# Patient Record
Sex: Female | Born: 1965
Health system: Southern US, Community
[De-identification: ages and names within clinical notes are randomized; demographics above are authoritative.]

## PROBLEM LIST (undated history)

## (undated) DIAGNOSIS — R112 Nausea with vomiting, unspecified: Secondary | ICD-10-CM

## (undated) DIAGNOSIS — Z9641 Presence of insulin pump (external) (internal): Secondary | ICD-10-CM

## (undated) DIAGNOSIS — M069 Rheumatoid arthritis, unspecified: Secondary | ICD-10-CM

## (undated) DIAGNOSIS — K449 Diaphragmatic hernia without obstruction or gangrene: Secondary | ICD-10-CM

## (undated) DIAGNOSIS — T7840XA Allergy, unspecified, initial encounter: Secondary | ICD-10-CM

## (undated) DIAGNOSIS — K589 Irritable bowel syndrome without diarrhea: Secondary | ICD-10-CM

## (undated) DIAGNOSIS — G43011 Migraine without aura, intractable, with status migrainosus: Secondary | ICD-10-CM

## (undated) DIAGNOSIS — I4891 Unspecified atrial fibrillation: Secondary | ICD-10-CM

## (undated) DIAGNOSIS — D649 Anemia, unspecified: Secondary | ICD-10-CM

## (undated) DIAGNOSIS — I10 Essential (primary) hypertension: Secondary | ICD-10-CM

## (undated) DIAGNOSIS — K219 Gastro-esophageal reflux disease without esophagitis: Secondary | ICD-10-CM

## (undated) DIAGNOSIS — K8020 Calculus of gallbladder without cholecystitis without obstruction: Secondary | ICD-10-CM

## (undated) DIAGNOSIS — D689 Coagulation defect, unspecified: Secondary | ICD-10-CM

## (undated) DIAGNOSIS — Z5189 Encounter for other specified aftercare: Secondary | ICD-10-CM

## (undated) DIAGNOSIS — E0500 Thyrotoxicosis with diffuse goiter without thyrotoxic crisis or storm: Secondary | ICD-10-CM

## (undated) DIAGNOSIS — I471 Supraventricular tachycardia, unspecified: Secondary | ICD-10-CM

## (undated) DIAGNOSIS — I498 Other specified cardiac arrhythmias: Secondary | ICD-10-CM

## (undated) DIAGNOSIS — L409 Psoriasis, unspecified: Secondary | ICD-10-CM

## (undated) DIAGNOSIS — M1990 Unspecified osteoarthritis, unspecified site: Secondary | ICD-10-CM

## (undated) DIAGNOSIS — Z9889 Other specified postprocedural states: Secondary | ICD-10-CM

## (undated) DIAGNOSIS — K259 Gastric ulcer, unspecified as acute or chronic, without hemorrhage or perforation: Secondary | ICD-10-CM

## (undated) DIAGNOSIS — N979 Female infertility, unspecified: Secondary | ICD-10-CM

## (undated) DIAGNOSIS — I219 Acute myocardial infarction, unspecified: Secondary | ICD-10-CM

## (undated) DIAGNOSIS — J069 Acute upper respiratory infection, unspecified: Secondary | ICD-10-CM

## (undated) DIAGNOSIS — F458 Other somatoform disorders: Secondary | ICD-10-CM

## (undated) DIAGNOSIS — H269 Unspecified cataract: Secondary | ICD-10-CM

## (undated) DIAGNOSIS — G5600 Carpal tunnel syndrome, unspecified upper limb: Secondary | ICD-10-CM

## (undated) DIAGNOSIS — E785 Hyperlipidemia, unspecified: Secondary | ICD-10-CM

## (undated) DIAGNOSIS — L259 Unspecified contact dermatitis, unspecified cause: Secondary | ICD-10-CM

## (undated) DIAGNOSIS — R591 Generalized enlarged lymph nodes: Secondary | ICD-10-CM

## (undated) DIAGNOSIS — J0100 Acute maxillary sinusitis, unspecified: Secondary | ICD-10-CM

## (undated) DIAGNOSIS — Z91041 Radiographic dye allergy status: Secondary | ICD-10-CM

## (undated) DIAGNOSIS — J301 Allergic rhinitis due to pollen: Secondary | ICD-10-CM

## (undated) DIAGNOSIS — I2109 ST elevation (STEMI) myocardial infarction involving other coronary artery of anterior wall: Secondary | ICD-10-CM

## (undated) DIAGNOSIS — E109 Type 1 diabetes mellitus without complications: Secondary | ICD-10-CM

## (undated) DIAGNOSIS — I25810 Atherosclerosis of coronary artery bypass graft(s) without angina pectoris: Secondary | ICD-10-CM

## (undated) DIAGNOSIS — E0590 Thyrotoxicosis, unspecified without thyrotoxic crisis or storm: Secondary | ICD-10-CM

## (undated) DIAGNOSIS — M6530 Trigger finger, unspecified finger: Secondary | ICD-10-CM

## (undated) DIAGNOSIS — D509 Iron deficiency anemia, unspecified: Secondary | ICD-10-CM

## (undated) DIAGNOSIS — M779 Enthesopathy, unspecified: Secondary | ICD-10-CM

## (undated) DIAGNOSIS — G4730 Sleep apnea, unspecified: Secondary | ICD-10-CM

## (undated) DIAGNOSIS — M549 Dorsalgia, unspecified: Secondary | ICD-10-CM

## (undated) DIAGNOSIS — E1139 Type 2 diabetes mellitus with other diabetic ophthalmic complication: Secondary | ICD-10-CM

## (undated) HISTORY — DX: Anemia, unspecified: D64.9

## (undated) HISTORY — PX: CATARACT EXTRACTION, BILATERAL: SHX1313

## (undated) HISTORY — DX: Gastro-esophageal reflux disease without esophagitis: K21.9

## (undated) HISTORY — PX: OTHER SURGICAL HISTORY: SHX169

## (undated) HISTORY — DX: Thyrotoxicosis with diffuse goiter without thyrotoxic crisis or storm: E05.00

## (undated) HISTORY — PX: CORONARY ARTERY BYPASS GRAFT: SHX141

## (undated) HISTORY — DX: Atherosclerosis of coronary artery bypass graft(s) without angina pectoris: I25.810

## (undated) HISTORY — DX: Essential (primary) hypertension: I10

## (undated) HISTORY — DX: Dorsalgia, unspecified: M54.9

## (undated) HISTORY — PX: VITRECTOMY: SHX106

## (undated) HISTORY — DX: Supraventricular tachycardia, unspecified: I47.10

## (undated) HISTORY — DX: Female infertility, unspecified: N97.9

## (undated) HISTORY — DX: Generalized enlarged lymph nodes: R59.1

## (undated) HISTORY — DX: Enthesopathy, unspecified: M77.9

## (undated) HISTORY — PX: EYE SURGERY: SHX253

## (undated) HISTORY — DX: Encounter for other specified aftercare: Z51.89

## (undated) HISTORY — DX: Iron deficiency anemia, unspecified: D50.9

## (undated) HISTORY — DX: Trigger finger, unspecified finger: M65.30

## (undated) HISTORY — DX: Unspecified atrial fibrillation: I48.91

## (undated) HISTORY — DX: Thyrotoxicosis, unspecified without thyrotoxic crisis or storm: E05.90

## (undated) HISTORY — DX: Psoriasis, unspecified: L40.9

## (undated) HISTORY — DX: Carpal tunnel syndrome, unspecified upper limb: G56.00

## (undated) HISTORY — DX: Type 2 diabetes mellitus with other diabetic ophthalmic complication: E11.39

## (undated) HISTORY — DX: Acute maxillary sinusitis, unspecified: J01.00

## (undated) HISTORY — DX: Unspecified contact dermatitis, unspecified cause: L25.9

## (undated) HISTORY — DX: Diaphragmatic hernia without obstruction or gangrene: K44.9

## (undated) HISTORY — DX: Type 1 diabetes mellitus without complications: E10.9

## (undated) HISTORY — DX: Other specified cardiac arrhythmias: I49.8

## (undated) HISTORY — DX: Hyperlipidemia, unspecified: E78.5

## (undated) HISTORY — PX: CHOLECYSTECTOMY: SHX55

## (undated) HISTORY — DX: Calculus of gallbladder without cholecystitis without obstruction: K80.20

## (undated) HISTORY — DX: Irritable bowel syndrome, unspecified: K58.9

## (undated) HISTORY — PX: ABDOMINAL HYSTERECTOMY: SHX81

## (undated) HISTORY — DX: Radiographic dye allergy status: Z91.041

## (undated) HISTORY — DX: Migraine without aura, intractable, with status migrainosus: G43.011

## (undated) HISTORY — DX: Coagulation defect, unspecified: D68.9

## (undated) HISTORY — DX: Acute myocardial infarction, unspecified: I21.9

## (undated) HISTORY — DX: Unspecified cataract: H26.9

## (undated) HISTORY — DX: Other somatoform disorders: F45.8

## (undated) HISTORY — DX: ST elevation (STEMI) myocardial infarction involving other coronary artery of anterior wall: I21.09

## (undated) HISTORY — DX: Allergic rhinitis due to pollen: J30.1

## (undated) HISTORY — PX: TRIGGER FINGER RELEASE: SHX641

## (undated) HISTORY — DX: Supraventricular tachycardia: I47.1

## (undated) HISTORY — DX: Acute upper respiratory infection, unspecified: J06.9

## (undated) HISTORY — DX: Unspecified osteoarthritis, unspecified site: M19.90

## (undated) HISTORY — DX: Rheumatoid arthritis, unspecified: M06.9

## (undated) HISTORY — DX: Allergy, unspecified, initial encounter: T78.40XA

## (undated) HISTORY — DX: Gastric ulcer, unspecified as acute or chronic, without hemorrhage or perforation: K25.9

## (undated) MED FILL — Medication: Fill #3 | Status: CN

---

## 1998-10-19 ENCOUNTER — Ambulatory Visit (HOSPITAL_COMMUNITY): Admission: RE | Admit: 1998-10-19 | Discharge: 1998-10-19 | Payer: Self-pay | Admitting: Orthopedic Surgery

## 1999-02-09 ENCOUNTER — Other Ambulatory Visit: Admission: RE | Admit: 1999-02-09 | Discharge: 1999-02-09 | Payer: Self-pay | Admitting: Obstetrics and Gynecology

## 1999-04-17 ENCOUNTER — Ambulatory Visit (HOSPITAL_COMMUNITY): Admission: RE | Admit: 1999-04-17 | Discharge: 1999-04-17 | Payer: Self-pay | Admitting: Ophthalmology

## 1999-04-17 ENCOUNTER — Encounter: Payer: Self-pay | Admitting: Ophthalmology

## 1999-10-22 ENCOUNTER — Inpatient Hospital Stay (HOSPITAL_COMMUNITY): Admission: AD | Admit: 1999-10-22 | Discharge: 1999-10-22 | Payer: Self-pay | Admitting: Obstetrics and Gynecology

## 2000-01-08 ENCOUNTER — Ambulatory Visit (HOSPITAL_COMMUNITY): Admission: RE | Admit: 2000-01-08 | Discharge: 2000-01-08 | Payer: Self-pay | Admitting: Ophthalmology

## 2000-02-26 ENCOUNTER — Other Ambulatory Visit: Admission: RE | Admit: 2000-02-26 | Discharge: 2000-02-26 | Payer: Self-pay | Admitting: Obstetrics and Gynecology

## 2000-07-14 ENCOUNTER — Inpatient Hospital Stay (HOSPITAL_COMMUNITY): Admission: AD | Admit: 2000-07-14 | Discharge: 2000-07-14 | Payer: Self-pay | Admitting: Obstetrics and Gynecology

## 2000-10-15 ENCOUNTER — Ambulatory Visit (HOSPITAL_COMMUNITY): Admission: RE | Admit: 2000-10-15 | Discharge: 2000-10-15 | Payer: Self-pay | Admitting: Obstetrics and Gynecology

## 2000-10-15 ENCOUNTER — Encounter: Payer: Self-pay | Admitting: Obstetrics and Gynecology

## 2001-04-24 ENCOUNTER — Other Ambulatory Visit: Admission: RE | Admit: 2001-04-24 | Discharge: 2001-04-24 | Payer: Self-pay | Admitting: Obstetrics and Gynecology

## 2001-07-17 ENCOUNTER — Encounter: Payer: Self-pay | Admitting: Obstetrics and Gynecology

## 2001-07-17 ENCOUNTER — Ambulatory Visit (HOSPITAL_COMMUNITY): Admission: RE | Admit: 2001-07-17 | Discharge: 2001-07-17 | Payer: Self-pay | Admitting: Obstetrics and Gynecology

## 2001-07-25 ENCOUNTER — Encounter: Payer: Self-pay | Admitting: Obstetrics and Gynecology

## 2001-07-25 ENCOUNTER — Inpatient Hospital Stay (HOSPITAL_COMMUNITY): Admission: AD | Admit: 2001-07-25 | Discharge: 2001-07-25 | Payer: Self-pay | Admitting: Obstetrics and Gynecology

## 2001-08-12 ENCOUNTER — Inpatient Hospital Stay (HOSPITAL_COMMUNITY): Admission: AD | Admit: 2001-08-12 | Discharge: 2001-08-16 | Payer: Self-pay | Admitting: Obstetrics and Gynecology

## 2001-08-13 ENCOUNTER — Encounter: Payer: Self-pay | Admitting: Obstetrics & Gynecology

## 2001-09-02 ENCOUNTER — Encounter (HOSPITAL_COMMUNITY): Admission: RE | Admit: 2001-09-02 | Discharge: 2001-09-05 | Payer: Self-pay | Admitting: Obstetrics and Gynecology

## 2001-09-02 ENCOUNTER — Encounter: Payer: Self-pay | Admitting: Obstetrics and Gynecology

## 2001-09-03 ENCOUNTER — Encounter: Payer: Self-pay | Admitting: Obstetrics and Gynecology

## 2001-09-05 ENCOUNTER — Inpatient Hospital Stay (HOSPITAL_COMMUNITY): Admission: AD | Admit: 2001-09-05 | Discharge: 2001-09-10 | Payer: Self-pay | Admitting: Obstetrics and Gynecology

## 2001-09-05 ENCOUNTER — Encounter (INDEPENDENT_AMBULATORY_CARE_PROVIDER_SITE_OTHER): Payer: Self-pay | Admitting: Specialist

## 2001-09-05 ENCOUNTER — Encounter: Payer: Self-pay | Admitting: Obstetrics and Gynecology

## 2001-09-11 ENCOUNTER — Encounter: Admission: RE | Admit: 2001-09-11 | Discharge: 2001-10-11 | Payer: Self-pay | Admitting: Obstetrics and Gynecology

## 2001-11-11 ENCOUNTER — Encounter: Admission: RE | Admit: 2001-11-11 | Discharge: 2001-12-11 | Payer: Self-pay | Admitting: Obstetrics and Gynecology

## 2002-02-13 ENCOUNTER — Ambulatory Visit (HOSPITAL_BASED_OUTPATIENT_CLINIC_OR_DEPARTMENT_OTHER): Admission: RE | Admit: 2002-02-13 | Discharge: 2002-02-13 | Payer: Self-pay | Admitting: Orthopedic Surgery

## 2002-04-06 ENCOUNTER — Other Ambulatory Visit: Admission: RE | Admit: 2002-04-06 | Discharge: 2002-04-06 | Payer: Self-pay | Admitting: Obstetrics and Gynecology

## 2002-07-28 ENCOUNTER — Ambulatory Visit (HOSPITAL_BASED_OUTPATIENT_CLINIC_OR_DEPARTMENT_OTHER): Admission: RE | Admit: 2002-07-28 | Discharge: 2002-07-28 | Payer: Self-pay | Admitting: Orthopaedic Surgery

## 2003-03-01 ENCOUNTER — Encounter: Payer: Self-pay | Admitting: Ophthalmology

## 2003-03-01 ENCOUNTER — Ambulatory Visit (HOSPITAL_COMMUNITY): Admission: RE | Admit: 2003-03-01 | Discharge: 2003-03-01 | Payer: Self-pay | Admitting: Ophthalmology

## 2003-04-20 ENCOUNTER — Other Ambulatory Visit: Admission: RE | Admit: 2003-04-20 | Discharge: 2003-04-20 | Payer: Self-pay | Admitting: Obstetrics and Gynecology

## 2003-08-23 ENCOUNTER — Ambulatory Visit (HOSPITAL_BASED_OUTPATIENT_CLINIC_OR_DEPARTMENT_OTHER): Admission: RE | Admit: 2003-08-23 | Discharge: 2003-08-23 | Payer: Self-pay | Admitting: Orthopedic Surgery

## 2004-05-05 ENCOUNTER — Other Ambulatory Visit: Admission: RE | Admit: 2004-05-05 | Discharge: 2004-05-05 | Payer: Self-pay | Admitting: Obstetrics and Gynecology

## 2004-08-31 ENCOUNTER — Encounter: Admission: RE | Admit: 2004-08-31 | Discharge: 2004-08-31 | Payer: Self-pay | Admitting: Rheumatology

## 2004-12-29 ENCOUNTER — Encounter: Admission: RE | Admit: 2004-12-29 | Discharge: 2004-12-29 | Payer: Self-pay | Admitting: Surgery

## 2005-01-05 ENCOUNTER — Ambulatory Visit (HOSPITAL_COMMUNITY): Admission: RE | Admit: 2005-01-05 | Discharge: 2005-01-05 | Payer: Self-pay | Admitting: Surgery

## 2005-01-14 ENCOUNTER — Emergency Department (HOSPITAL_COMMUNITY): Admission: EM | Admit: 2005-01-14 | Discharge: 2005-01-15 | Payer: Self-pay | Admitting: Emergency Medicine

## 2005-01-22 ENCOUNTER — Encounter (INDEPENDENT_AMBULATORY_CARE_PROVIDER_SITE_OTHER): Payer: Self-pay | Admitting: Specialist

## 2005-01-22 ENCOUNTER — Observation Stay (HOSPITAL_COMMUNITY): Admission: RE | Admit: 2005-01-22 | Discharge: 2005-01-22 | Payer: Self-pay | Admitting: Surgery

## 2005-05-31 ENCOUNTER — Encounter: Payer: Self-pay | Admitting: Family Medicine

## 2005-05-31 LAB — CONVERTED CEMR LAB

## 2005-06-11 ENCOUNTER — Ambulatory Visit: Payer: Self-pay | Admitting: Internal Medicine

## 2005-06-18 ENCOUNTER — Ambulatory Visit: Payer: Self-pay | Admitting: Cardiology

## 2005-06-21 ENCOUNTER — Ambulatory Visit: Payer: Self-pay | Admitting: Internal Medicine

## 2005-07-24 ENCOUNTER — Ambulatory Visit: Payer: Self-pay | Admitting: Internal Medicine

## 2005-09-17 ENCOUNTER — Other Ambulatory Visit: Admission: RE | Admit: 2005-09-17 | Discharge: 2005-09-17 | Payer: Self-pay | Admitting: Obstetrics and Gynecology

## 2005-12-02 ENCOUNTER — Encounter: Admission: RE | Admit: 2005-12-02 | Discharge: 2005-12-02 | Payer: Self-pay | Admitting: Rheumatology

## 2006-02-27 ENCOUNTER — Ambulatory Visit (HOSPITAL_COMMUNITY): Admission: RE | Admit: 2006-02-27 | Discharge: 2006-02-27 | Payer: Self-pay | Admitting: Orthopedic Surgery

## 2006-09-10 ENCOUNTER — Encounter: Admission: RE | Admit: 2006-09-10 | Discharge: 2006-09-10 | Payer: Self-pay | Admitting: Rheumatology

## 2006-10-15 ENCOUNTER — Ambulatory Visit: Payer: Self-pay | Admitting: Family Medicine

## 2006-10-16 ENCOUNTER — Encounter: Payer: Self-pay | Admitting: Family Medicine

## 2006-10-16 DIAGNOSIS — M069 Rheumatoid arthritis, unspecified: Secondary | ICD-10-CM | POA: Insufficient documentation

## 2006-10-16 DIAGNOSIS — E108 Type 1 diabetes mellitus with unspecified complications: Secondary | ICD-10-CM | POA: Insufficient documentation

## 2006-10-16 DIAGNOSIS — E109 Type 1 diabetes mellitus without complications: Secondary | ICD-10-CM | POA: Insufficient documentation

## 2006-10-16 DIAGNOSIS — K8020 Calculus of gallbladder without cholecystitis without obstruction: Secondary | ICD-10-CM | POA: Insufficient documentation

## 2006-10-16 DIAGNOSIS — I10 Essential (primary) hypertension: Secondary | ICD-10-CM | POA: Insufficient documentation

## 2006-10-16 DIAGNOSIS — J301 Allergic rhinitis due to pollen: Secondary | ICD-10-CM | POA: Insufficient documentation

## 2007-01-24 ENCOUNTER — Ambulatory Visit: Payer: Self-pay | Admitting: Family Medicine

## 2007-01-24 DIAGNOSIS — J069 Acute upper respiratory infection, unspecified: Secondary | ICD-10-CM | POA: Insufficient documentation

## 2007-01-24 DIAGNOSIS — G43009 Migraine without aura, not intractable, without status migrainosus: Secondary | ICD-10-CM | POA: Insufficient documentation

## 2007-02-24 ENCOUNTER — Ambulatory Visit: Payer: Self-pay | Admitting: Family Medicine

## 2007-04-03 ENCOUNTER — Ambulatory Visit: Payer: Self-pay | Admitting: Family Medicine

## 2007-04-03 LAB — CONVERTED CEMR LAB
Blood in Urine, dipstick: NEGATIVE
Glucose, Urine, Semiquant: NEGATIVE
Ketones, urine, test strip: NEGATIVE
Protein, U semiquant: NEGATIVE
Specific Gravity, Urine: 1.025
pH: 5.5

## 2007-05-13 ENCOUNTER — Telehealth: Payer: Self-pay | Admitting: Family Medicine

## 2007-06-26 ENCOUNTER — Ambulatory Visit: Payer: Self-pay | Admitting: Family Medicine

## 2007-06-26 LAB — CONVERTED CEMR LAB: Hgb A1c MFr Bld: 6.8 %

## 2008-01-15 LAB — CONVERTED CEMR LAB: Hgb A1c MFr Bld: 7.1 %

## 2008-01-22 ENCOUNTER — Ambulatory Visit: Payer: Self-pay | Admitting: Family Medicine

## 2008-01-28 ENCOUNTER — Encounter: Payer: Self-pay | Admitting: Family Medicine

## 2008-02-19 ENCOUNTER — Ambulatory Visit: Payer: Self-pay | Admitting: Family Medicine

## 2008-02-19 DIAGNOSIS — G43011 Migraine without aura, intractable, with status migrainosus: Secondary | ICD-10-CM

## 2008-02-19 HISTORY — DX: Migraine without aura, intractable, with status migrainosus: G43.011

## 2008-04-29 ENCOUNTER — Encounter: Payer: Self-pay | Admitting: Family Medicine

## 2008-05-28 ENCOUNTER — Encounter: Payer: Self-pay | Admitting: Family Medicine

## 2008-08-27 ENCOUNTER — Encounter: Payer: Self-pay | Admitting: Family Medicine

## 2008-11-03 ENCOUNTER — Encounter: Payer: Self-pay | Admitting: Family Medicine

## 2008-12-08 ENCOUNTER — Ambulatory Visit: Payer: Self-pay | Admitting: Family Medicine

## 2009-01-27 ENCOUNTER — Encounter: Admission: RE | Admit: 2009-01-27 | Discharge: 2009-01-27 | Payer: Self-pay | Admitting: Obstetrics and Gynecology

## 2009-02-03 ENCOUNTER — Encounter: Payer: Self-pay | Admitting: Family Medicine

## 2009-03-31 ENCOUNTER — Ambulatory Visit: Payer: Self-pay | Admitting: Cardiothoracic Surgery

## 2009-04-04 ENCOUNTER — Other Ambulatory Visit: Payer: Self-pay | Admitting: Cardiothoracic Surgery

## 2009-04-20 ENCOUNTER — Ambulatory Visit: Payer: Self-pay | Admitting: Cardiothoracic Surgery

## 2009-04-25 DIAGNOSIS — E1139 Type 2 diabetes mellitus with other diabetic ophthalmic complication: Secondary | ICD-10-CM | POA: Insufficient documentation

## 2009-04-25 DIAGNOSIS — M6530 Trigger finger, unspecified finger: Secondary | ICD-10-CM | POA: Insufficient documentation

## 2009-04-25 DIAGNOSIS — G5600 Carpal tunnel syndrome, unspecified upper limb: Secondary | ICD-10-CM | POA: Insufficient documentation

## 2009-04-26 ENCOUNTER — Encounter (INDEPENDENT_AMBULATORY_CARE_PROVIDER_SITE_OTHER): Payer: Self-pay

## 2009-04-26 ENCOUNTER — Ambulatory Visit: Payer: Self-pay | Admitting: Cardiovascular Disease

## 2009-04-26 DIAGNOSIS — R079 Chest pain, unspecified: Secondary | ICD-10-CM | POA: Insufficient documentation

## 2009-04-26 DIAGNOSIS — E785 Hyperlipidemia, unspecified: Secondary | ICD-10-CM | POA: Insufficient documentation

## 2009-04-26 DIAGNOSIS — I2109 ST elevation (STEMI) myocardial infarction involving other coronary artery of anterior wall: Secondary | ICD-10-CM | POA: Insufficient documentation

## 2009-04-28 ENCOUNTER — Ambulatory Visit: Payer: Self-pay | Admitting: Cardiovascular Disease

## 2009-04-28 LAB — CONVERTED CEMR LAB
Basophils Absolute: 0 10*3/uL (ref 0.0–0.1)
Calcium: 9 mg/dL (ref 8.4–10.5)
Eosinophils Relative: 19.6 % — ABNORMAL HIGH (ref 0.0–5.0)
GFR calc non Af Amer: 97.77 mL/min (ref >60)
Glucose, Bld: 172 mg/dL — ABNORMAL HIGH (ref 70–99)
HCT: 35.2 % — ABNORMAL LOW (ref 36.0–46.0)
Hemoglobin: 12.2 g/dL (ref 12.0–15.0)
Lymphocytes Relative: 26.2 % (ref 12.0–46.0)
Lymphs Abs: 1.4 10*3/uL (ref 0.7–4.0)
Monocytes Relative: 5.2 % (ref 3.0–12.0)
Neutro Abs: 2.6 10*3/uL (ref 1.4–7.7)
Potassium: 3.7 meq/L (ref 3.5–5.1)
RBC: 4 M/uL (ref 3.87–5.11)
RDW: 12.5 % (ref 11.5–14.6)
Sodium: 145 meq/L (ref 135–145)
WBC: 5.4 10*3/uL (ref 4.5–10.5)

## 2009-04-29 ENCOUNTER — Encounter (INDEPENDENT_AMBULATORY_CARE_PROVIDER_SITE_OTHER): Payer: Self-pay

## 2009-04-29 ENCOUNTER — Encounter: Payer: Self-pay | Admitting: Cardiovascular Disease

## 2009-05-02 ENCOUNTER — Inpatient Hospital Stay (HOSPITAL_BASED_OUTPATIENT_CLINIC_OR_DEPARTMENT_OTHER): Admission: RE | Admit: 2009-05-02 | Discharge: 2009-05-02 | Payer: Self-pay | Admitting: Cardiovascular Disease

## 2009-05-02 ENCOUNTER — Ambulatory Visit: Payer: Self-pay | Admitting: Cardiovascular Disease

## 2009-05-05 ENCOUNTER — Telehealth: Payer: Self-pay | Admitting: Cardiovascular Disease

## 2009-05-10 ENCOUNTER — Telehealth (INDEPENDENT_AMBULATORY_CARE_PROVIDER_SITE_OTHER): Payer: Self-pay | Admitting: *Deleted

## 2009-05-11 ENCOUNTER — Encounter: Payer: Self-pay | Admitting: Family Medicine

## 2009-05-11 ENCOUNTER — Telehealth: Payer: Self-pay | Admitting: Cardiovascular Disease

## 2009-05-11 ENCOUNTER — Encounter (INDEPENDENT_AMBULATORY_CARE_PROVIDER_SITE_OTHER): Payer: Self-pay | Admitting: Radiology

## 2009-05-18 ENCOUNTER — Telehealth (INDEPENDENT_AMBULATORY_CARE_PROVIDER_SITE_OTHER): Payer: Self-pay | Admitting: *Deleted

## 2009-05-19 ENCOUNTER — Ambulatory Visit: Payer: Self-pay

## 2009-05-24 ENCOUNTER — Ambulatory Visit: Payer: Self-pay | Admitting: Cardiovascular Disease

## 2009-05-31 ENCOUNTER — Ambulatory Visit: Payer: Self-pay | Admitting: Cardiovascular Disease

## 2009-06-02 DIAGNOSIS — I209 Angina pectoris, unspecified: Secondary | ICD-10-CM | POA: Insufficient documentation

## 2009-06-02 LAB — CONVERTED CEMR LAB
AST: 17 units/L (ref 0–37)
Albumin: 3.7 g/dL (ref 3.5–5.2)
Alkaline Phosphatase: 64 units/L (ref 39–117)
Cholesterol: 155 mg/dL (ref 0–200)
HDL: 69.4 mg/dL (ref >39.00)
LDL Cholesterol: 74 mg/dL (ref 0–99)
Total Protein: 7 g/dL (ref 6.0–8.3)
Triglycerides: 60 mg/dL (ref 0.0–149.0)

## 2009-06-03 ENCOUNTER — Ambulatory Visit: Payer: Self-pay | Admitting: Cardiovascular Disease

## 2009-06-03 ENCOUNTER — Encounter: Payer: Self-pay | Admitting: Cardiovascular Disease

## 2009-06-03 ENCOUNTER — Ambulatory Visit: Payer: Self-pay | Admitting: Cardiothoracic Surgery

## 2009-06-03 ENCOUNTER — Inpatient Hospital Stay (HOSPITAL_COMMUNITY): Admission: AD | Admit: 2009-06-03 | Discharge: 2009-06-12 | Payer: Self-pay | Admitting: Cardiovascular Disease

## 2009-06-06 ENCOUNTER — Encounter: Payer: Self-pay | Admitting: Cardiothoracic Surgery

## 2009-06-07 ENCOUNTER — Encounter: Payer: Self-pay | Admitting: Cardiovascular Disease

## 2009-06-07 DIAGNOSIS — I25810 Atherosclerosis of coronary artery bypass graft(s) without angina pectoris: Secondary | ICD-10-CM

## 2009-06-07 DIAGNOSIS — Z951 Presence of aortocoronary bypass graft: Secondary | ICD-10-CM | POA: Insufficient documentation

## 2009-06-17 ENCOUNTER — Encounter: Payer: Self-pay | Admitting: Cardiovascular Disease

## 2009-06-21 ENCOUNTER — Ambulatory Visit: Payer: Self-pay | Admitting: Cardiovascular Disease

## 2009-06-21 LAB — CONVERTED CEMR LAB
Calcium: 8.4 mg/dL (ref 8.4–10.5)
Chloride: 101 meq/L (ref 96–112)
Creatinine, Ser: 0.9 mg/dL (ref 0.4–1.2)
Sodium: 139 meq/L (ref 135–145)

## 2009-06-29 ENCOUNTER — Telehealth: Payer: Self-pay | Admitting: Family Medicine

## 2009-07-04 ENCOUNTER — Inpatient Hospital Stay (HOSPITAL_COMMUNITY): Admission: EM | Admit: 2009-07-04 | Discharge: 2009-07-07 | Payer: Self-pay | Admitting: Emergency Medicine

## 2009-07-04 ENCOUNTER — Ambulatory Visit: Payer: Self-pay | Admitting: Cardiology

## 2009-07-04 ENCOUNTER — Telehealth (INDEPENDENT_AMBULATORY_CARE_PROVIDER_SITE_OTHER): Payer: Self-pay | Admitting: Physician Assistant

## 2009-07-05 ENCOUNTER — Ambulatory Visit: Payer: Self-pay | Admitting: Gastroenterology

## 2009-07-05 ENCOUNTER — Encounter (INDEPENDENT_AMBULATORY_CARE_PROVIDER_SITE_OTHER): Payer: Self-pay | Admitting: Internal Medicine

## 2009-07-06 ENCOUNTER — Encounter: Payer: Self-pay | Admitting: Gastroenterology

## 2009-07-14 ENCOUNTER — Ambulatory Visit: Payer: Self-pay | Admitting: Cardiothoracic Surgery

## 2009-07-14 ENCOUNTER — Encounter (HOSPITAL_COMMUNITY): Admission: RE | Admit: 2009-07-14 | Discharge: 2009-09-16 | Payer: Self-pay | Admitting: Cardiovascular Disease

## 2009-07-19 ENCOUNTER — Ambulatory Visit: Payer: Self-pay | Admitting: Cardiovascular Disease

## 2009-07-20 ENCOUNTER — Encounter: Payer: Self-pay | Admitting: Cardiovascular Disease

## 2009-07-20 DIAGNOSIS — I4891 Unspecified atrial fibrillation: Secondary | ICD-10-CM | POA: Insufficient documentation

## 2009-08-01 ENCOUNTER — Encounter: Admission: RE | Admit: 2009-08-01 | Discharge: 2009-08-01 | Payer: Self-pay | Admitting: Cardiothoracic Surgery

## 2009-08-01 ENCOUNTER — Ambulatory Visit: Payer: Self-pay | Admitting: Cardiothoracic Surgery

## 2009-08-12 ENCOUNTER — Telehealth (INDEPENDENT_AMBULATORY_CARE_PROVIDER_SITE_OTHER): Payer: Self-pay | Admitting: *Deleted

## 2009-08-17 ENCOUNTER — Telehealth: Payer: Self-pay | Admitting: Cardiovascular Disease

## 2009-08-17 ENCOUNTER — Encounter: Payer: Self-pay | Admitting: Cardiovascular Disease

## 2009-08-18 ENCOUNTER — Encounter: Payer: Self-pay | Admitting: Cardiovascular Disease

## 2009-08-29 ENCOUNTER — Encounter: Payer: Self-pay | Admitting: Cardiovascular Disease

## 2009-08-31 ENCOUNTER — Telehealth: Payer: Self-pay | Admitting: Cardiovascular Disease

## 2009-09-10 ENCOUNTER — Encounter: Payer: Self-pay | Admitting: Family Medicine

## 2009-10-03 ENCOUNTER — Encounter: Payer: Self-pay | Admitting: Cardiovascular Disease

## 2009-10-12 ENCOUNTER — Encounter (INDEPENDENT_AMBULATORY_CARE_PROVIDER_SITE_OTHER): Payer: Self-pay | Admitting: *Deleted

## 2009-10-13 ENCOUNTER — Encounter: Payer: Self-pay | Admitting: Family Medicine

## 2009-10-13 ENCOUNTER — Encounter: Payer: Self-pay | Admitting: Cardiovascular Disease

## 2009-10-17 ENCOUNTER — Ambulatory Visit: Payer: Self-pay | Admitting: Cardiovascular Disease

## 2009-10-24 LAB — CONVERTED CEMR LAB
Albumin: 3.8 g/dL (ref 3.5–5.2)
Alkaline Phosphatase: 49 units/L (ref 39–117)
Cholesterol: 133 mg/dL (ref 0–200)
HDL: 52.7 mg/dL (ref >39.00)
LDL Cholesterol: 68 mg/dL (ref 0–99)
Total CHOL/HDL Ratio: 3
Total Protein: 6.9 g/dL (ref 6.0–8.3)
Triglycerides: 61 mg/dL (ref 0.0–149.0)

## 2009-11-14 ENCOUNTER — Telehealth: Payer: Self-pay | Admitting: Cardiovascular Disease

## 2009-11-14 ENCOUNTER — Ambulatory Visit: Payer: Self-pay | Admitting: Cardiovascular Disease

## 2009-11-14 DIAGNOSIS — R42 Dizziness and giddiness: Secondary | ICD-10-CM | POA: Insufficient documentation

## 2009-11-17 ENCOUNTER — Encounter: Payer: Self-pay | Admitting: Cardiology

## 2009-11-17 ENCOUNTER — Ambulatory Visit: Payer: Self-pay | Admitting: Cardiovascular Disease

## 2009-11-28 ENCOUNTER — Telehealth: Payer: Self-pay | Admitting: Cardiovascular Disease

## 2009-12-07 ENCOUNTER — Encounter: Payer: Self-pay | Admitting: Family Medicine

## 2009-12-29 ENCOUNTER — Ambulatory Visit (HOSPITAL_COMMUNITY): Admission: RE | Admit: 2009-12-29 | Discharge: 2009-12-29 | Payer: Self-pay | Admitting: Orthopedic Surgery

## 2010-01-09 ENCOUNTER — Encounter: Payer: Self-pay | Admitting: Family Medicine

## 2010-01-09 ENCOUNTER — Encounter: Payer: Self-pay | Admitting: Cardiovascular Disease

## 2010-02-01 ENCOUNTER — Encounter: Payer: Self-pay | Admitting: Family Medicine

## 2010-02-21 ENCOUNTER — Ambulatory Visit: Payer: Self-pay | Admitting: Cardiovascular Disease

## 2010-02-28 LAB — CONVERTED CEMR LAB
ALT: 13 units/L (ref 0–35)
AST: 18 units/L (ref 0–37)
Bilirubin, Direct: 0 mg/dL (ref 0.0–0.3)
Cholesterol: 117 mg/dL (ref 0–200)
HDL: 59.8 mg/dL (ref >39.00)
Total Bilirubin: 0.2 mg/dL — ABNORMAL LOW (ref 0.3–1.2)
Total Protein: 6.5 g/dL (ref 6.0–8.3)
VLDL: 8.4 mg/dL (ref 0.0–40.0)

## 2010-03-21 ENCOUNTER — Encounter: Admission: RE | Admit: 2010-03-21 | Discharge: 2010-03-21 | Payer: Self-pay | Admitting: Orthopedic Surgery

## 2010-04-10 IMAGING — CR DG CHEST 1V PORT
1 series · 1 of 1 positions shown · non-contrast
Comparison: Chest radiograph 06/08/2009

CLINICAL DATA: CABG

PORTABLE CHEST - 1 VIEW

[AP]
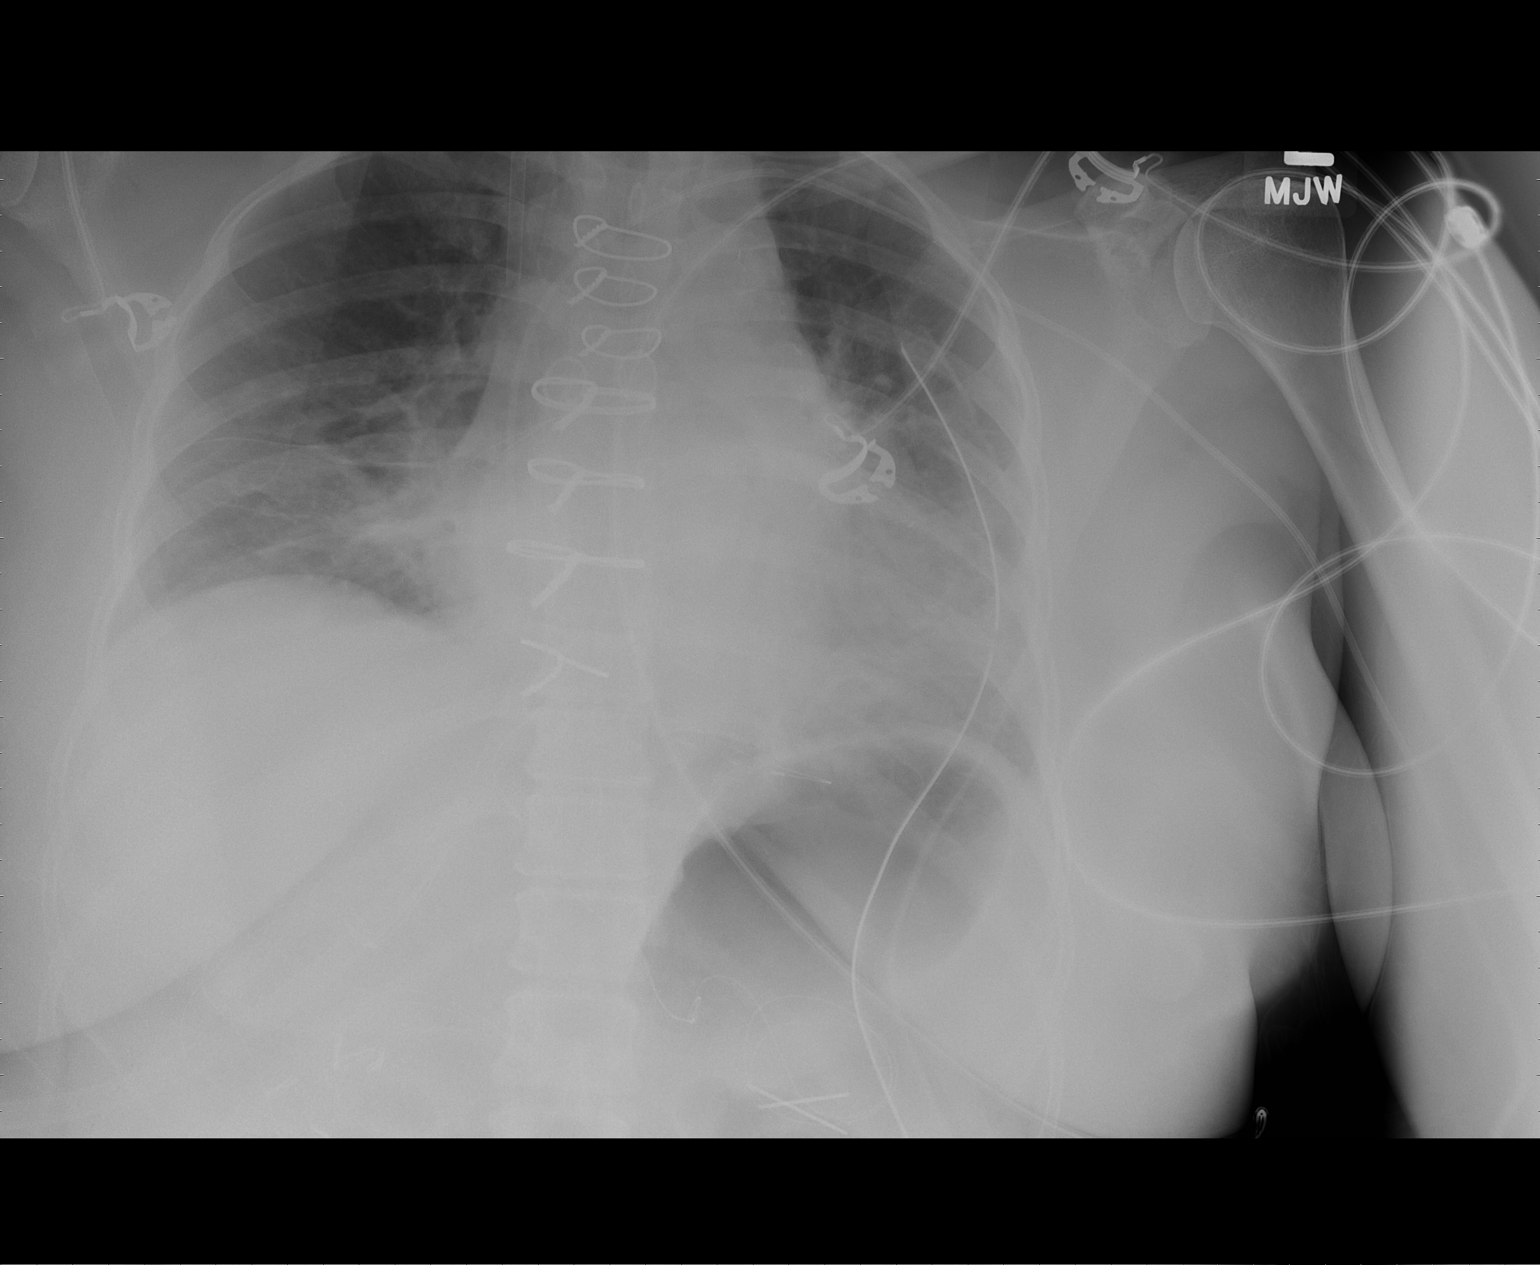

[1 of 1 positions shown; findings below may reference images not displayed]

FINDINGS: Interval retraction of Swan-Ganz catheter.  Right IJ
sheath remains.  Left chest tube remains.  There is a small left
apical pneumothorax measuring  3 mm from the chest wall.  There is
bibasilar atelectasis which is slightly increased from prior.
IMPRESSION: 1.  Small left apical pneumothorax.

2.  Left chest tube in place.
3.  Slight increase in bibasilar atelectasis.

This was made a call report.

## 2010-04-11 IMAGING — CR DG CHEST 2V
2 series · 2 of 2 positions shown · non-contrast
Comparison: 06/09/2009

CLINICAL DATA: Coronary artery disease status post CABG, shortness
of breath

CHEST - 2 VIEW

[w chest pa]
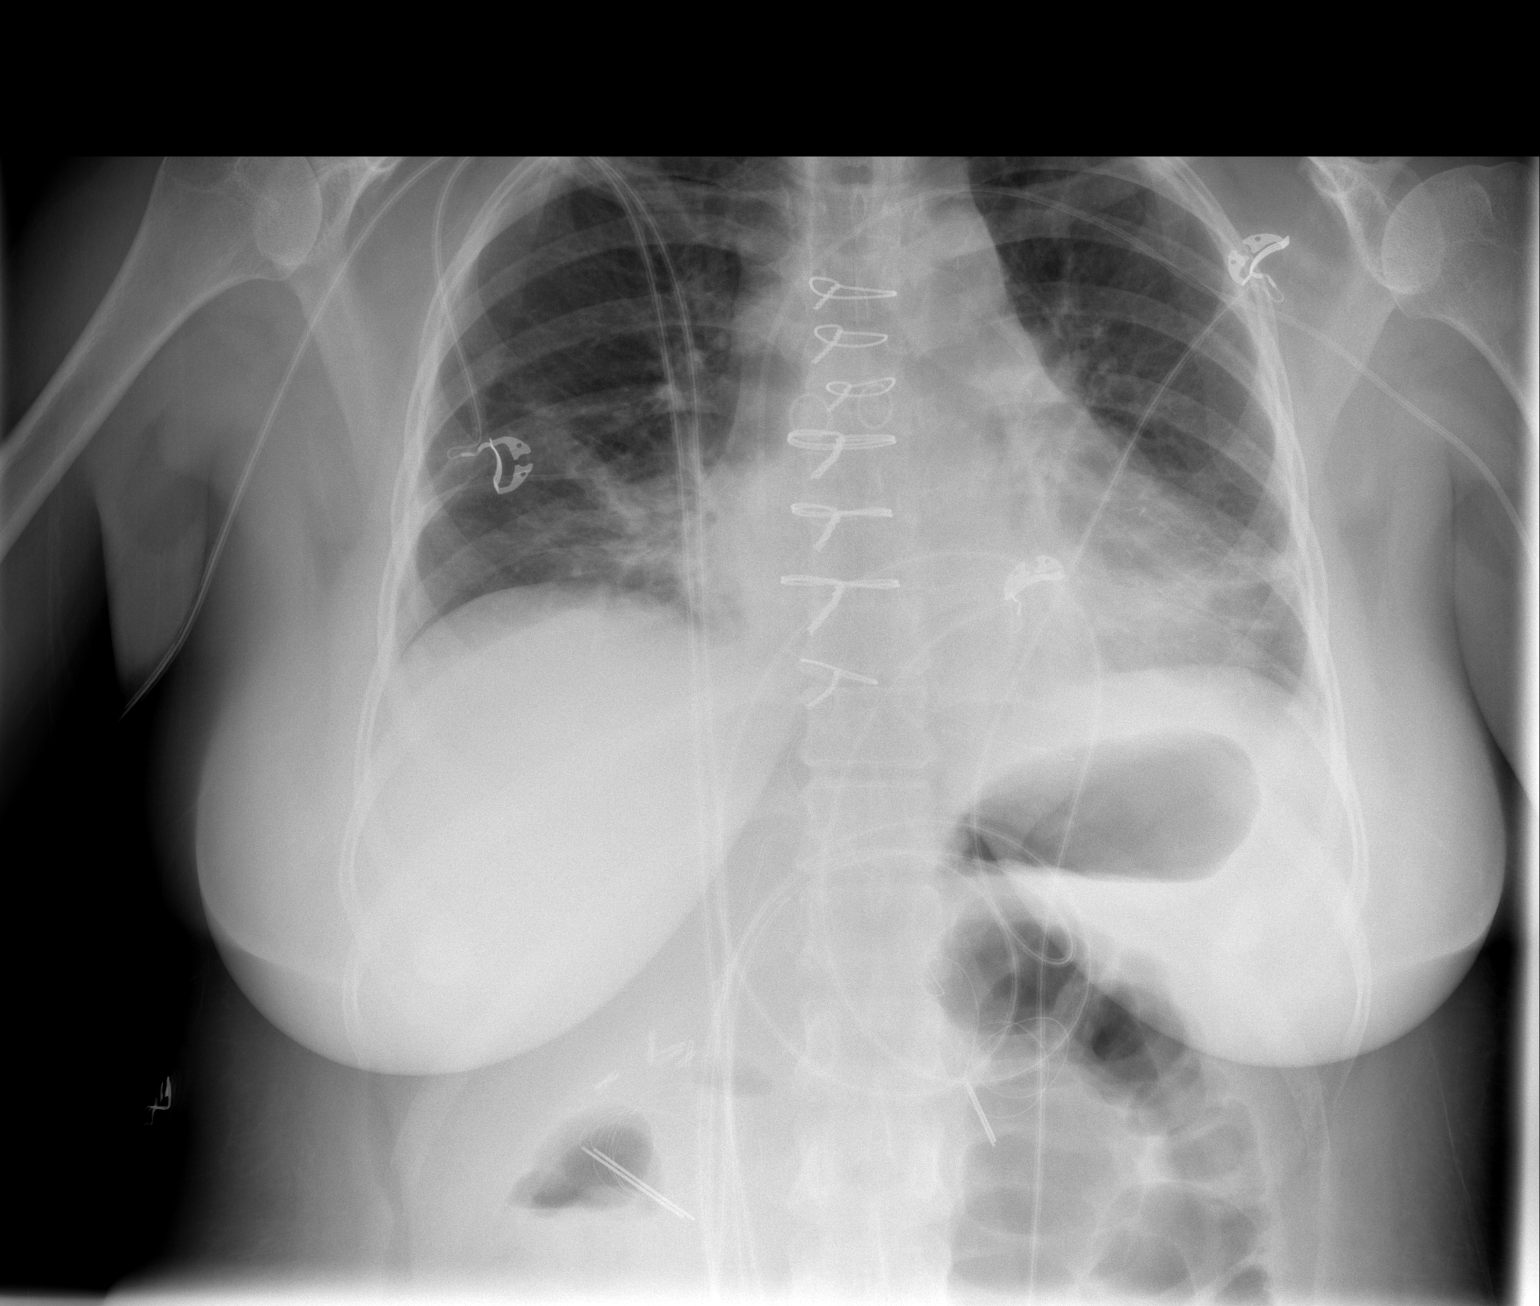

[w chest lat]
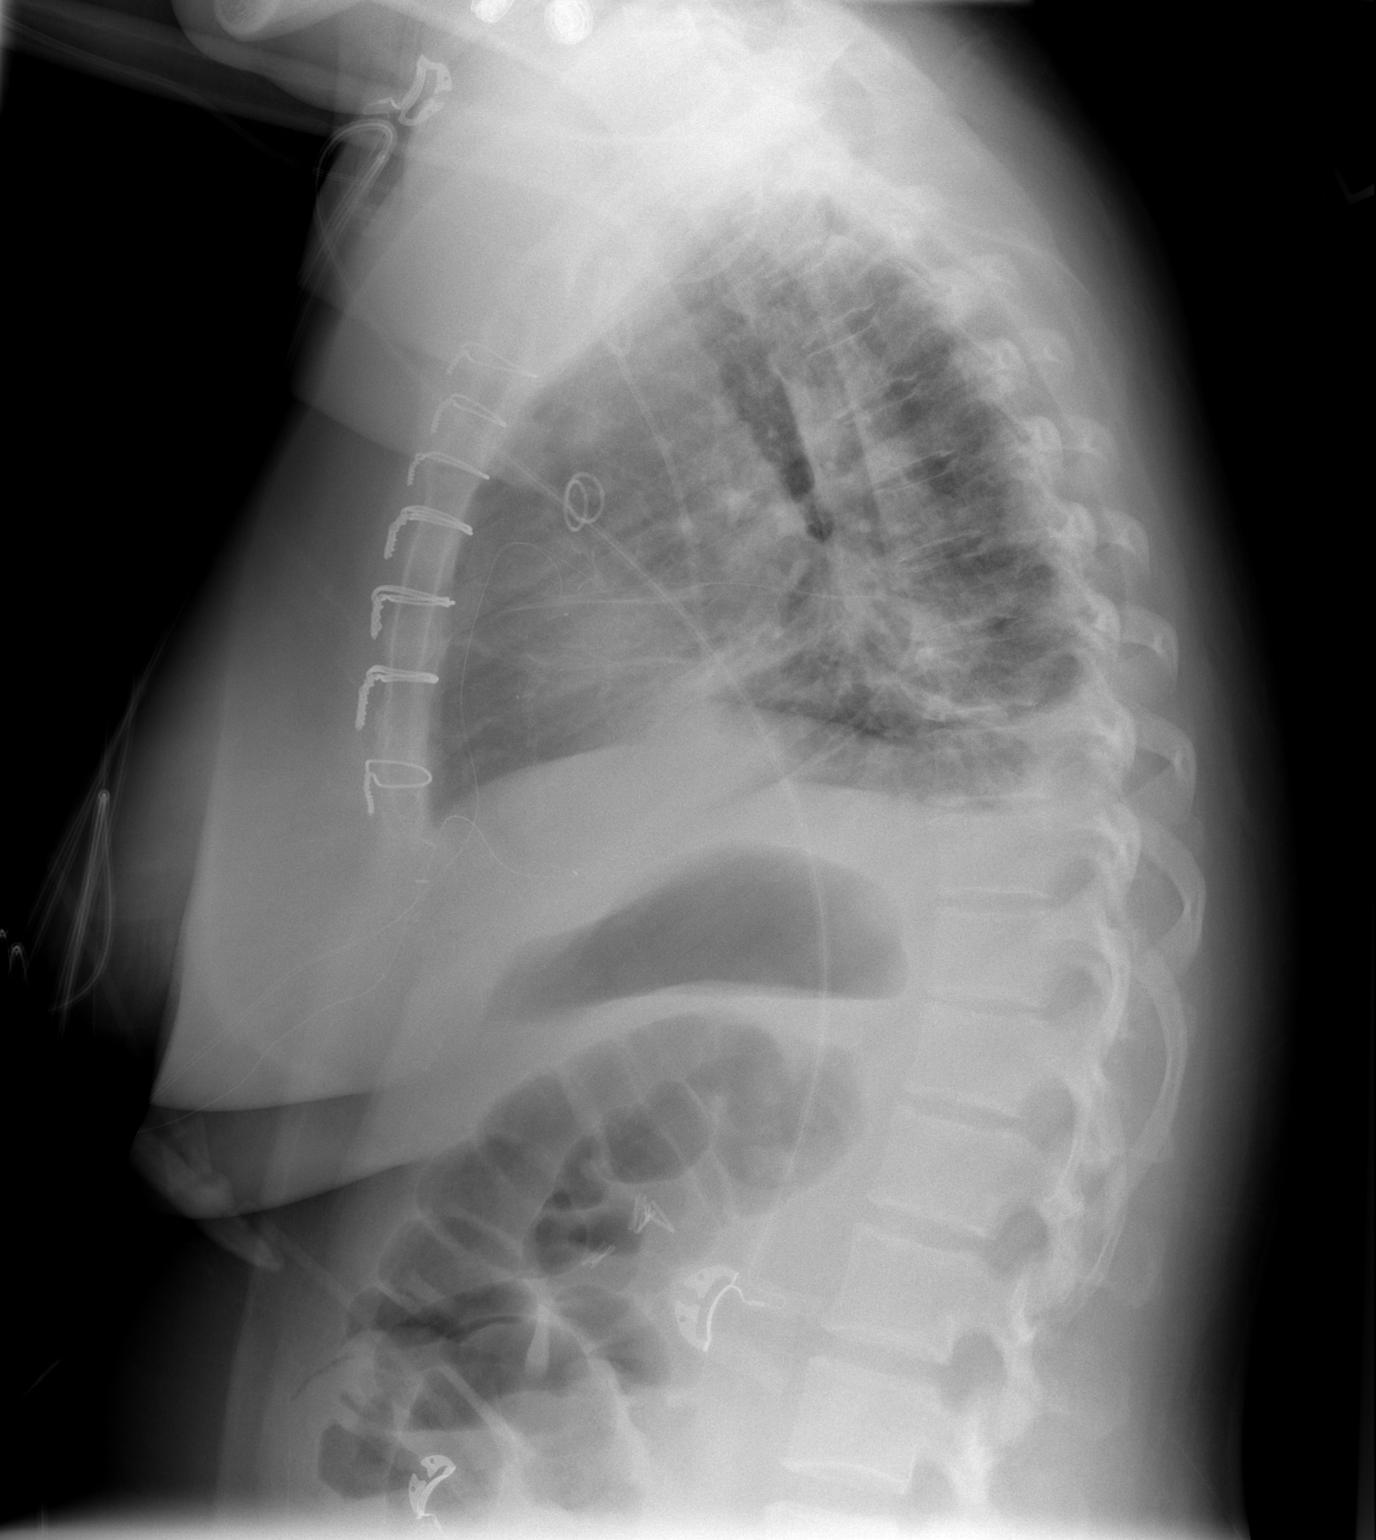

[2 of 2 positions shown; findings below may reference images not displayed]

FINDINGS: Left arm PICC line stable, tip at cavoatrial junction.
Left thoracostomy tube removed.
Mild cardiac enlargement status post CABG.
Mediastinal contours and pulmonary vascularity grossly normal.
Bibasilar atelectasis.
No gross acute failure or pneumothorax.
Epicardial pacing wires noted.
IMPRESSION: Cardiomegaly status post CABG.
Bibasilar atelectasis.

## 2010-04-13 ENCOUNTER — Telehealth: Payer: Self-pay | Admitting: Cardiovascular Disease

## 2010-04-28 ENCOUNTER — Ambulatory Visit (HOSPITAL_COMMUNITY): Admission: RE | Admit: 2010-04-28 | Discharge: 2010-04-28 | Payer: Self-pay | Admitting: Orthopedic Surgery

## 2010-07-10 ENCOUNTER — Telehealth: Payer: Self-pay | Admitting: Cardiovascular Disease

## 2010-07-12 ENCOUNTER — Ambulatory Visit: Payer: Self-pay | Admitting: Internal Medicine

## 2010-07-12 DIAGNOSIS — R609 Edema, unspecified: Secondary | ICD-10-CM | POA: Insufficient documentation

## 2010-07-13 LAB — CONVERTED CEMR LAB
BUN: 13 mg/dL (ref 6–23)
Calcium: 8.6 mg/dL (ref 8.4–10.5)
GFR calc non Af Amer: 92.61 mL/min (ref >60)
Glucose, Bld: 88 mg/dL (ref 70–99)
Potassium: 4 meq/L (ref 3.5–5.1)
Pro B Natriuretic peptide (BNP): 28.9 pg/mL (ref 0.0–100.0)

## 2010-07-20 ENCOUNTER — Ambulatory Visit: Payer: Self-pay | Admitting: Family

## 2010-07-20 ENCOUNTER — Encounter: Payer: Self-pay | Admitting: Internal Medicine

## 2010-07-20 ENCOUNTER — Ambulatory Visit: Payer: Self-pay | Admitting: Cardiology

## 2010-07-20 ENCOUNTER — Ambulatory Visit: Payer: Self-pay

## 2010-07-20 ENCOUNTER — Ambulatory Visit (HOSPITAL_COMMUNITY): Admission: RE | Admit: 2010-07-20 | Discharge: 2010-07-20 | Payer: Self-pay | Admitting: Internal Medicine

## 2010-07-20 DIAGNOSIS — L259 Unspecified contact dermatitis, unspecified cause: Secondary | ICD-10-CM | POA: Insufficient documentation

## 2010-07-24 ENCOUNTER — Ambulatory Visit: Payer: Self-pay | Admitting: Cardiovascular Disease

## 2010-08-01 ENCOUNTER — Encounter (INDEPENDENT_AMBULATORY_CARE_PROVIDER_SITE_OTHER): Payer: Self-pay | Admitting: Obstetrics and Gynecology

## 2010-08-01 ENCOUNTER — Inpatient Hospital Stay (HOSPITAL_COMMUNITY): Admission: RE | Admit: 2010-08-01 | Discharge: 2010-08-03 | Payer: Self-pay | Admitting: Obstetrics and Gynecology

## 2010-10-16 ENCOUNTER — Ambulatory Visit: Payer: Self-pay | Admitting: Family Medicine

## 2010-10-16 DIAGNOSIS — J0100 Acute maxillary sinusitis, unspecified: Secondary | ICD-10-CM | POA: Insufficient documentation

## 2010-10-26 ENCOUNTER — Ambulatory Visit: Payer: Self-pay | Admitting: Cardiovascular Disease

## 2010-11-08 ENCOUNTER — Encounter: Payer: Self-pay | Admitting: Physician Assistant

## 2010-11-08 ENCOUNTER — Ambulatory Visit: Payer: Self-pay | Admitting: Physician Assistant

## 2010-11-08 ENCOUNTER — Ambulatory Visit: Payer: Self-pay | Admitting: Cardiovascular Disease

## 2010-11-08 ENCOUNTER — Telehealth: Payer: Self-pay | Admitting: Physician Assistant

## 2010-11-08 DIAGNOSIS — R0602 Shortness of breath: Secondary | ICD-10-CM | POA: Insufficient documentation

## 2010-11-08 DIAGNOSIS — R197 Diarrhea, unspecified: Secondary | ICD-10-CM | POA: Insufficient documentation

## 2010-11-08 DIAGNOSIS — I498 Other specified cardiac arrhythmias: Secondary | ICD-10-CM | POA: Insufficient documentation

## 2010-11-08 HISTORY — DX: Other specified cardiac arrhythmias: I49.8

## 2010-11-09 DIAGNOSIS — E0590 Thyrotoxicosis, unspecified without thyrotoxic crisis or storm: Secondary | ICD-10-CM | POA: Insufficient documentation

## 2010-11-09 LAB — CONVERTED CEMR LAB
BUN: 15 mg/dL (ref 6–23)
Basophils Relative: 0.3 % (ref 0.0–3.0)
Bilirubin, Direct: 0.1 mg/dL (ref 0.0–0.3)
Calcium: 9.7 mg/dL (ref 8.4–10.5)
Creatinine, Ser: 0.5 mg/dL (ref 0.4–1.2)
Eosinophils Relative: 1.7 % (ref 0.0–5.0)
GFR calc non Af Amer: 146.48 mL/min (ref >60)
HCT: 38.3 % (ref 36.0–46.0)
Hemoglobin: 13.1 g/dL (ref 12.0–15.0)
Lymphs Abs: 1.4 10*3/uL (ref 0.7–4.0)
MCHC: 34.1 g/dL (ref 30.0–36.0)
MCV: 81.7 fL (ref 78.0–100.0)
Monocytes Absolute: 0.6 10*3/uL (ref 0.1–1.0)
Neutro Abs: 3.1 10*3/uL (ref 1.4–7.7)
Neutrophils Relative %: 59.8 % (ref 43.0–77.0)
RBC: 4.68 M/uL (ref 3.87–5.11)
Total Bilirubin: 0.6 mg/dL (ref 0.3–1.2)
WBC: 5.2 10*3/uL (ref 4.5–10.5)

## 2010-11-10 ENCOUNTER — Telehealth: Payer: Self-pay | Admitting: Family Medicine

## 2010-11-10 LAB — CONVERTED CEMR LAB: Free T4: 3.08 ng/dL — ABNORMAL HIGH (ref 0.60–1.60)

## 2010-11-13 ENCOUNTER — Telehealth: Payer: Self-pay | Admitting: Cardiovascular Disease

## 2010-11-14 ENCOUNTER — Telehealth (INDEPENDENT_AMBULATORY_CARE_PROVIDER_SITE_OTHER): Payer: Self-pay | Admitting: *Deleted

## 2010-11-15 ENCOUNTER — Encounter (HOSPITAL_COMMUNITY)
Admission: RE | Admit: 2010-11-15 | Discharge: 2010-12-30 | Payer: Self-pay | Source: Home / Self Care | Attending: Cardiovascular Disease | Admitting: Cardiovascular Disease

## 2010-11-15 ENCOUNTER — Ambulatory Visit: Payer: Self-pay | Admitting: Cardiovascular Disease

## 2010-11-15 ENCOUNTER — Ambulatory Visit: Payer: Self-pay

## 2010-11-15 ENCOUNTER — Encounter: Payer: Self-pay | Admitting: Cardiovascular Disease

## 2011-01-21 ENCOUNTER — Encounter: Payer: Self-pay | Admitting: Obstetrics and Gynecology

## 2011-01-21 ENCOUNTER — Encounter: Payer: Self-pay | Admitting: Orthopedic Surgery

## 2011-01-22 ENCOUNTER — Encounter: Payer: Self-pay | Admitting: Cardiothoracic Surgery

## 2011-01-30 NOTE — Progress Notes (Signed)
Summary: Nuclear Pre-Procedure  Phone Note Outgoing Call   Call placed by: Milana Na, EMT-P,  November 14, 2010 3:11 PM Summary of Call: Left message with information on Myoview Information Sheet (see scanned document for details).      Nuclear Med Background Indications for Stress Test: Evaluation for Ischemia, Stent Patency  Indications Comments: Assess for ischemia & help guide timing of possible CABG  History: Heart Catheterization, Myocardial Infarction, Stents  History Comments: 3/10 IWMI>STENT-RCA;05/02/09 Cath:patent stent, totalled LAD, normal LVF  Symptoms: Chest Pain, Chest Pressure, Dizziness, DOE, Fatigue, Light-Headedness, Nausea, Palpitations, Rapid HR    Nuclear Pre-Procedure Cardiac Risk Factors: Hypertension, IDDM Type 1, Lipids, Overweight Height (in): 63  Nuclear Med Study Referring MD:  Tonny Bollman, MD

## 2011-01-30 NOTE — Progress Notes (Signed)
Summary:  medical clearance  Phone Note Call from Patient Call back at [phone]   Caller: Patient Summary of Call: Pt request call about medical clearance. Initial call taken by: Judie Grieve,  April 13, 2010 8:41 AM  Follow-up for Phone Call        I spoke with the pt and her GYN,  Dr Conley Simmonds [phone]), would like to perform either a total hysterectomy or remove the pt's ovaries laproscopically.  Dr Edward Jolly is concerned because of the length of time the pt would be under general anesthesia for the hysterectomy.  Dr Edward Jolly would like to get Dr Earmon Phoenix thoughts on how the pt would do from a cardiac standpoint under general anesthesia. I will forward this information to Dr Excell Seltzer for review.  Follow-up by: Julieta Gutting, RN, BSN,  April 13, 2010 9:16 AM  Additional Follow-up for Phone Call Additional follow up Details #1::        She is s/p CABG within last year and should be low-risk for surgery. LVEF is preserved. She has had DES greater than one year ago implanted at the time of MI, but that vessel has now been bypassed. OK to proceed with surgery without further CV testing as pt has been stable without angina at outpatient visits. I would be happy to discuss further with pt or Dr Edward Jolly. Additional Follow-up by: Norva Karvonen, MD,  April 13, 2010 12:03 PM    Additional Follow-up for Phone Call Additional follow up Details #2::    Left message with Dr Earmon Phoenix recommendations on the pt's identified voicemail.  I will fax a copy of this note to Dr Edward Jolly.  Julieta Gutting, RN, BSN  April 13, 2010 2:11 PM

## 2011-01-30 NOTE — Progress Notes (Signed)
Summary: medication before surgery  Phone Note Call from Patient   Caller: Patient Reason for Call: Talk to Nurse Summary of Call: pt has questions re medications before her surgery w/Dr Edward Jolly pls [phone] Initial call taken by: Glynda Jaeger,  July 10, 2010 1:43 PM  Follow-up for Phone Call        I spoke with the pt and she is scheduled for a total hysterectomy on August 2nd.  The pt is currently taking ASA and plavix and wanted to know is she is okay to hold these prior to surgery.  The pt said she has not received any recommendations from Dr Edward Jolly about holding medications at this time. I will forward this information to Dr Excell Seltzer for review.  Follow-up by: Julieta Gutting, RN, BSN,  July 10, 2010 3:10 PM  Additional Follow-up for Phone Call Additional follow up Details #1::        preferable to continue ASA 81 mg, but if she needs to hold both ASA and plavix, this is ok.  Additional Follow-up by: Norva Karvonen, MD,  July 12, 2010 7:08 AM    Additional Follow-up for Phone Call Additional follow up Details #2::    Left message for pt to call back. Julieta Gutting, RN, BSN  July 12, 2010 8:57 AM  I spoke with the pt about Dr Earmon Phoenix recommendations for ASA and Plavix prior to surgery. The pt was also concerned because she has started to have swelling in her lower extremities over the last 2 weeks, coughing and more SOB.  I have arranged an appt for the pt to see Dr Johney Frame (DOD) today.  Follow-up by: Julieta Gutting, RN, BSN,  July 12, 2010 10:52 AM

## 2011-01-30 NOTE — Assessment & Plan Note (Signed)
Summary: blisters on foot, ? Poison Oak-jr   Vital Signs:  Patient profile:   45 year old female Height:      63 inches Weight:      160 pounds Pulse rate:   97 / minute BP sitting:   108 / 65  (left arm) Cuff size:   regular  Vitals Entered By: [name] (July 20, 2010 1:33 PM) CC: blisters and sores on feet and lower legs. legs itch- painful x 3 weeks   Primary Care Provider:  Seymour Bars D.O.  CC:  blisters and sores on feet and lower legs. legs itch- painful x 3 weeks.  History of Present Illness: [name] is a 45 year old female who presents with a 3 week history of rash both feet and on her legs.  She has not tried any OTC meds.  Notes that the areas on her legs are very puritic, however the areas on the soles of her feet burn, but do not itch.  She wonders if this is poison oak.  She did have one exposure out side several weeks back.  She is concerned about the possibility of infection on her feet given that she is diabetic.  Current Medications (verified): 1)  Humalog 100 Unit/ml Soln (Insulin Lispro (Human)) .... 50-60units Subcutaneously Daily 2)  Adult Aspirin Low Strength 81 Mg  Tbdp (Aspirin) .Marland Kitchen.. 1 Tab By Mouth Daily 3)  Imitrex 100 Mg  Tabs (Sumatriptan Succinate) .Marland Kitchen.. 1 Tab By Mouth X1 As Needed Migraine and Repeat in 2 Hrs X 1 If Needed 4)  Flexeril 10 Mg Tabs (Cyclobenzaprine Hcl) .... Take 1 Tablet By Mouth Once A Day At Bedtime 5)  Arava 20 Mg Tabs (Leflunomide) .... Take 1 Tablet By Mouth Once A Day 6)  Plavix 75 Mg Tabs (Clopidogrel Bisulfate) .... Take One Tablet By Mouth Daily 7)  Nitroglycerin 0.4 Mg/hr Pt24 (Nitroglycerin) .... Apply 1 Patch Each Morning and Remove At Bedtime 8)  Minimed Pump Reservoir 3ml  Misc (Insulin Infusion Pump Supplies) .... As Directed 9)  Crestor 5 Mg Tabs (Rosuvastatin Calcium) .... Take One Tablet By Mouth Daily. 10)  Metoprolol Tartrate 25 Mg Tabs (Metoprolol Tartrate) .... Take One-Half  Tablet By Mouth Twice A Day 11)   Ramipril 2.5 Mg Caps (Ramipril) .... Take One Capsule By Mouth Daily 12)  Levoxyl 25 Mcg Tabs (Levothyroxine Sodium) .... Take 1 Tablet By Mouth Once A Day 13)  Cimzia- Arthritis Medication .... Every Other Week 14)  Furosemide 40 Mg Tabs (Furosemide) .Marland Kitchen.. 1 By Mouth Daily  Allergies (verified): 1)  ! Compazine 2)  ! * Shell Fish  Comments:  Nurse/Medical Assistant: The patient's medications and allergies were reviewed with the patient and were updated in the Medication and Allergy Lists. [name] (July 20, 2010 1:34 PM)  Physical Exam  General:  Well-developed,well-nourished,in no acute distress; alert,appropriate and cooperative throughout examination Skin:  + patches of erythema noted on shins/thighs.  + erythema and healing blisters noted on bilateral soles of feet.     Impression & Recommendations:  Problem # 1:  DERMATITIS, ALLERGIC (ICD-692.9) Assessment New Suspect poison ivy, however will add empiric keflex to help prevent a secondary cellulitis on her feet. Treat itching areas with betamethasone. Her updated medication list for this problem includes:    Betamethasone Valerate 0.1 % Oint (Betamethasone valerate) .Marland Kitchen... Apply twice daily to affected area  Complete Medication List: 1)  Humalog 100 Unit/ml Soln (Insulin lispro (human)) .... 50-60units subcutaneously daily 2)  Adult Aspirin Low Strength 81 Mg Tbdp (Aspirin) .Marland Kitchen.. 1 tab by mouth daily 3)  Imitrex 100 Mg Tabs (Sumatriptan succinate) .Marland Kitchen.. 1 tab by mouth x1 as needed migraine and repeat in 2 hrs x 1 if needed 4)  Flexeril 10 Mg Tabs (Cyclobenzaprine hcl) .... Take 1 tablet by mouth once a day at bedtime 5)  Arava 20 Mg Tabs (Leflunomide) .... Take 1 tablet by mouth once a day 6)  Plavix 75 Mg Tabs (Clopidogrel bisulfate) .... Take one tablet by mouth daily 7)  Nitroglycerin 0.4 Mg/hr Pt24 (Nitroglycerin) .... Apply 1 patch each morning and remove at bedtime 8)  Minimed Pump Reservoir 3ml Misc (Insulin infusion  pump supplies) .... As directed 9)  Crestor 5 Mg Tabs (Rosuvastatin calcium) .... Take one tablet by mouth daily. 10)  Metoprolol Tartrate 25 Mg Tabs (Metoprolol tartrate) .... Take one-half  tablet by mouth twice a day 11)  Ramipril 2.5 Mg Caps (Ramipril) .... Take one capsule by mouth daily 12)  Levoxyl 25 Mcg Tabs (Levothyroxine sodium) .... Take 1 tablet by mouth once a day 13)  Cimzia- Arthritis Medication  .... Every other week 14)  Furosemide 40 Mg Tabs (Furosemide) .Marland Kitchen.. 1 by mouth daily 15)  Betamethasone Valerate 0.1 % Oint (Betamethasone valerate) .... Apply twice daily to affected area 16)  Keflex 500 Mg Caps (Cephalexin) .... One  cap by mouth two times a day x 7 days  Patient Instructions: 1)  Please call if symptoms worsen or do not improve. Prescriptions: KEFLEX 500 MG CAPS (CEPHALEXIN) one  cap by mouth two times a day x 7 days  #14 x 0   Entered and Authorized by:   Lemont Fillers FNP   Signed by:   Lemont Fillers FNP on 07/20/2010   Method used:   Electronically to        [facility]* (retail)       [address]       [address] Shipping/mailing       [place], [place]  [address]       Ph: [phone]       Fax: [phone]   RxID:   [id number] BETAMETHASONE VALERATE 0.1 % OINT (BETAMETHASONE VALERATE) apply twice daily to affected area  #1 x 0   Entered and Authorized by:   Lemont Fillers FNP   Signed by:   Lemont Fillers FNP on 07/20/2010   Method used:   Electronically to        [facility]* (retail)       [address]       [address] Shipping/mailing       [place], [place]  [address]       Ph: [phone]       Fax: [phone]   RxID:   [id number]

## 2011-01-30 NOTE — Letter (Signed)
Summary: Out of Work  [facility]  [address], [address]   [place], [place] [address]   Phone: [phone]  Fax: [phone]    October 16, 2010   Employee:  GENESYS COGGESHALL    To Whom It May Concern:   For Medical reasons, please excuse the above named employee from work for the following dates:  Start:   Oct 17th  End:   Oct 18th  If you need additional information, please feel free to contact our office.         Sincerely,    Seymour Bars DO

## 2011-01-30 NOTE — Letter (Signed)
Summary: [facility]  [facility]   Imported By: Lanelle Bal 02/10/2010 11:28:48  _____________________________________________________________________  External Attachment:    Type:   Image     Comment:   External Document

## 2011-01-30 NOTE — Letter (Signed)
Summary: Return To Work  [employer], Main Office  [address]   [place], [place] [address]   Phone: [phone]  Fax: [phone]    11/08/2010  TO: Leodis Sias IT MAY CONCERN   RE: [name] [address] [id number]   The above named individual is under my medical care and may return to work on:11/09/10  If you have any further questions or need additional information, please call.     Sincerely,   Seymour Bars DO Ollen Gross, RN, BSN

## 2011-01-30 NOTE — Assessment & Plan Note (Signed)
Summary: f10m   Visit Type:  3 months follow up Primary Provider:  Seymour Bars D.O.  CC:  No complaints.  History of Present Illness: This is a 45 year old woman with coronary artery disease status post CABG. She initially presented with an inferior wall myocardial infarction in the spring of 2010. She was treated with a drug-eluting stent to the right coronary artery. At that time, she was noted to have total occlusion of the LAD. She ultimately underwent multivessel coronary bypass by Dr. Tyrone Sage in June of 2010.     Current Medications (verified): 1)  Humalog 100 Unit/ml Soln (Insulin Lispro (Human)) .... 50-60units By Pump Once Daily 2)  Adult Aspirin Low Strength 81 Mg  Tbdp (Aspirin) .Marland Kitchen.. 1 Tab By Mouth Daily 3)  Imitrex 100 Mg  Tabs (Sumatriptan Succinate) .Marland Kitchen.. 1 Tab By Mouth X1 As Needed Migraine and Repeat in 2 Hrs X 1 If Needed 4)  Flexeril 10 Mg Tabs (Cyclobenzaprine Hcl) .... Take 1 Tablet By Mouth Once A Day At Bedtime 5)  Arava 20 Mg Tabs (Leflunomide) .... Take 1 Tablet By Mouth Once A Day 6)  Plavix 75 Mg Tabs (Clopidogrel Bisulfate) .... Take One Tablet By Mouth Daily 7)  Nitroglycerin 0.4 Mg/hr Pt24 (Nitroglycerin) .... Apply 1 Patch Each Morning and Remove At Bedtime 8)  Minimed Pump Reservoir 3ml  Misc (Insulin Infusion Pump Supplies) .... As Directed 9)  Crestor 5 Mg Tabs (Rosuvastatin Calcium) .... Take One Tablet By Mouth Daily. 10)  Metoprolol Tartrate 25 Mg Tabs (Metoprolol Tartrate) .... Take One-Half  Tablet By Mouth Twice A Day 11)  Ramipril 2.5 Mg Caps (Ramipril) .... Take One Capsule By Mouth Daily 12)  Levoxyl 25 Mcg Tabs (Levothyroxine Sodium) .... Take 1 Tablet By Mouth Once A Day 13)  Cimzia- Arthritis Medication .... Every Other Week  Allergies: 1)  ! Compazine 2)  ! * Shell Fish  Past History:  Past medical history reviewed for relevance to current acute and chronic problems.  Past Medical History: Reviewed history from 10/17/2009 and no  changes required. CAD with inferior MI treated with a drug-eluting stent, and ultimately coronary bypass surgery to treat severe multivessel disease. Postoperative atrial fibrillation, resolved DIABETIC  RETINOPATHY (ICD-250.50) TRIGGER FINGER (ICD-727.03) CARPAL TUNNEL SYNDROME, BILATERAL (ICD-354.0) MIGRAINE W/O AURA W/INTRACT W/STATUS MIGRAINOSUS (ICD-346.13) URI (ICD-465.9) COMMON MIGRAINE (ICD-346.10) CHOLELITHIASIS (ICD-574.20) ESSENTIAL HYPERTENSION (ICD-401.9) ARTHRITIS, RHEUMATOID (ICD-714.0) ALLERGIC RHINITIS, SEASONAL (ICD-477.0) DIABETES MELLITUS, TYPE I (ICD-250.01)   DM type I - Dr Roanna Raider RA - Dr Corliss Skains Gyn:  Dr Edward Jolly  Review of Systems       Negative except as per HPI   Vital Signs:  Patient profile:   45 year old female Height:      63 inches Weight:      151 pounds BMI:     26.85 Pulse rate:   96 / minute Pulse rhythm:   regular Resp:     18 per minute BP sitting:   132 / 70  (left arm) Cuff size:   large  Vitals Entered By: Vikki Ports (October 26, 2010 4:02 PM)  Physical Exam  General:  Pt is alert and oriented, in no acute distress. HEENT: normal Neck: normal carotid upstrokes without bruits, JVP normal Lungs: CTA CV: RRR without murmur or gallop Abd: soft, NT, positive BS, no bruit, no organomegaly Ext: no clubbing, cyanosis, or edema. peripheral pulses 2+ and equal Skin: warm and dry without rash    Impression & Recommendations:  Problem # 1:  CAD, ARTERY BYPASS GRAFT (ICD-414.04) Pt stable without angina. She is greater than one year out from PCI and has subsequently undergone CABG. She would like to discontinue plavix and I advised this would be ok. Otherwise continue current meds without change. She asked about estrogen replacement therapy now that she undergone a hysterectomy. We reviewed the small increased risk of CV events, but she is having severe symptoms and I think low-dose estrogen would be ok to alleviate symptoms.  The  following medications were removed from the medication list:    Plavix 75 Mg Tabs (Clopidogrel bisulfate) .Marland Kitchen... Take one tablet by mouth daily Her updated medication list for this problem includes:    Adult Aspirin Low Strength 81 Mg Tbdp (Aspirin) .Marland Kitchen... 1 tab by mouth daily    Nitroglycerin 0.4 Mg/hr Pt24 (Nitroglycerin) .Marland Kitchen... Apply 1 patch each morning and remove at bedtime    Metoprolol Tartrate 25 Mg Tabs (Metoprolol tartrate) .Marland Kitchen... Take one-half  tablet by mouth twice a day    Ramipril 2.5 Mg Caps (Ramipril) .Marland Kitchen... Take one capsule by mouth daily  Problem # 2:  ESSENTIAL HYPERTENSION (ICD-401.9) BP controlled. Continue disease specific agents with ACE and beta blocker as above.  Her updated medication list for this problem includes:    Adult Aspirin Low Strength 81 Mg Tbdp (Aspirin) .Marland Kitchen... 1 tab by mouth daily    Metoprolol Tartrate 25 Mg Tabs (Metoprolol tartrate) .Marland Kitchen... Take one-half  tablet by mouth twice a day    Ramipril 2.5 Mg Caps (Ramipril) .Marland Kitchen... Take one capsule by mouth daily  BP today: 132/70 Prior BP: 122/71 (10/16/2010)  Labs Reviewed: K+: 4.0 (07/12/2010) Creat: : 0.7 (07/12/2010)   Chol: 117 (02/21/2010)   HDL: 59.80 (02/21/2010)   LDL: 49 (02/21/2010)   TG: 42.0 (02/21/2010)  Problem # 3:  HYPERLIPIDEMIA-MIXED (ICD-272.4) Lipids at goal. Continue current regimen and repeat in Feb 2012.  Her updated medication list for this problem includes:    Crestor 5 Mg Tabs (Rosuvastatin calcium) .Marland Kitchen... Take one tablet by mouth daily.  CHOL: 117 (02/21/2010)   LDL: 49 (02/21/2010)   HDL: 59.80 (02/21/2010)   TG: 42.0 (02/21/2010)  Patient Instructions: 1)  Your physician recommends that you schedule a follow-up appointment in: 6 months 2)  Your physician recommends that you return for a FASTING lipid profile and liver function test in February.  3)  Your physician recommends that you continue on your current medications as directed. Please refer to the Current Medication list  given to you today. 4)  Your physician has recommended you make the following change in your medication:  STOP PLAVIX Prescriptions: RAMIPRIL 2.5 MG CAPS (RAMIPRIL) Take one capsule by mouth daily  #90 x 3   Entered by:   Whitney Maeola Sarah RN   Authorized by:   Norva Karvonen, MD   Signed by:   Ellender Hose RN on 10/26/2010   Method used:   Electronically to        [facility]* (retail)       [address]       [address] [place] [address]mailing       [place], [place]  [address]       Ph: [phone]       Fax: [phone]   RxID:   [id number]

## 2011-01-30 NOTE — Progress Notes (Signed)
Summary: thyroid labs  Phone Note Outgoing Call   Summary of Call: Dr Cathey Endow I left a message on your nurse line yesterday. I saw [name] 2 days ago.  She was having tachycardia.  Her TSH is low and her T4 and T3 are both high. I have faxed you her labs. We have left a message with her to call back to inform her. I will have her increase her metoprolol to 25 mg two times a day. Initial call taken by: Brynda Rim,  November 10, 2010 8:07 AM  Follow-up for Phone Call        Pls call Dr Roanna Raider - her endocrinologist and fax these labs to her.  She will need to f/u with them for hyperthyroidism. Follow-up by: Seymour Bars DO,  November 10, 2010 9:13 AM  Additional Follow-up for Phone Call Additional follow up Details #1::        I called Dr.Doerr's office at [phone] and spoke to a Melissa, and Dr. Roanna Raider is no longer with that office, but the pt. is still in there system as a patient there and they will be glad to see her. I faxed their office her labs at Fax [phone].... I also called pt and left a message on her home and cell to call me back.Michaelle Copas  November 15, 2010 9:17 AM  Additional Follow-up by: Michaelle Copas,  November 15, 2010 9:17 AM

## 2011-01-30 NOTE — Progress Notes (Signed)
Summary: does pt need to have stress test on wed  Phone Note Call from Patient Call back at [facility] Phone [phone] Call back at Work Phone [phone]   Caller: Patient  Reason for Call: Talk to Nurse, Lab or Test Results Details for Reason: does pt need to have stress test on wednesday.  Initial call taken by: Lorne Skeens,  November 13, 2010 11:15 AM  Follow-up for Phone Call        I spoke with the pt and made her aware that she should have Myoview as scheduled due to DOE.  Follow-up by: Julieta Gutting, RN, BSN,  November 13, 2010 11:33 AM

## 2011-01-30 NOTE — Letter (Signed)
SummaryDeboraha [name] Physician Progress Note  [facility] Progress Note   Imported By: Roderic Ovens 01/27/2010 10:59:13  _____________________________________________________________________  External Attachment:    Type:   Image     Comment:   External Document

## 2011-01-30 NOTE — Letter (Signed)
Summary: [facility]   Imported By: Lanelle Bal 01/31/2010 13:16:45  _____________________________________________________________________  External Attachment:    Type:   Image     Comment:   External Document

## 2011-01-30 NOTE — Assessment & Plan Note (Signed)
Summary: Sinus Tach/Sweaty denies cp   Visit Type:  add on Referring Aribelle Mccosh:  Dr Tyrone Sage Primary Julyanna Scholle:  Seymour Bars D.O.  CC:  sinus tach, SOB, and fatigued.  History of Present Illness: Primary Cardiologist:  Dr. Tonny Bollman   This is a 45 year old woman with coronary artery disease status post CABG, HTN, Type 1 DM, hyperlipidemia and rheumatoid arthritis. She initially presented with an inferior wall myocardial infarction in the spring of 2010. She was treated with a drug-eluting stent to the right coronary artery. At that time, she was noted to have total occlusion of the LAD. She ultimately underwent multivessel coronary bypass by Dr. Tyrone Sage in June of 2010.  Her last echo was done in July 2011 and demonstrated normal LVF with diastolic dysfunction.  This was done in response to volume overload and she was asked to use furosemide on an as needed basis.  She presents to the office today with complaints of elevated heart rate over the last 2 weeks.  She denies chest pain.  She has noted dyspnea with exertion over the last couple weeks.  She describes New York Heart Association class II symptoms.  She denies orthopnea or PND.  She denies significant pedal edema.  She denies syncope.  She is a Engineer, civil (consulting) at [employer].  She had a coworker check her heart rate today and it was in the 120s.  She called and was added on the schedule.  She did check her pulse last night for the first time since her symptoms started.  Her pulse was 120 last night as well.  She does note a recent sinus infection.  She was placed on Augmentin.  Of note, she took Keflex in July for a foot infection.  She states that she's had diarrhea over the last several weeks since she started taking Augmentin.  She probably is 2-3 stools per day.  Current Medications (verified): 1)  Humalog 100 Unit/ml Soln (Insulin Lispro (Human)) .... 50-60units By Pump Once Daily 2)  Adult Aspirin Low Strength 81 Mg  Tbdp (Aspirin) .Marland Kitchen.. 1  Tab By Mouth Daily 3)  Imitrex 100 Mg  Tabs (Sumatriptan Succinate) .Marland Kitchen.. 1 Tab By Mouth X1 As Needed Migraine and Repeat in 2 Hrs X 1 If Needed 4)  Flexeril 10 Mg Tabs (Cyclobenzaprine Hcl) .... Take 1 Tablet By Mouth Once A Day At Bedtime 5)  Arava 20 Mg Tabs (Leflunomide) .... Take 1 Tablet By Mouth Once A Day 6)  Nitroglycerin 0.4 Mg/hr Pt24 (Nitroglycerin) .... Apply 1 Patch Each Morning and Remove At Bedtime 7)  Minimed Pump Reservoir 3ml  Misc (Insulin Infusion Pump Supplies) .... As Directed 8)  Crestor 5 Mg Tabs (Rosuvastatin Calcium) .... Take One Tablet By Mouth Daily. 9)  Metoprolol Tartrate 25 Mg Tabs (Metoprolol Tartrate) .... Take One-Half  Tablet By Mouth Twice A Day 10)  Ramipril 2.5 Mg Caps (Ramipril) .... Take One Capsule By Mouth Daily 11)  Levoxyl 25 Mcg Tabs (Levothyroxine Sodium) .... Take 1 Tablet By Mouth Once A Day 12)  Cimzia- Arthritis Medication .... Every Other Week  Allergies (verified): 1)  ! Compazine 2)  ! * Shell Fish  Past History:  Past Medical History: Last updated: 10/17/2009 CAD with inferior MI treated with a drug-eluting stent, and ultimately coronary bypass surgery to treat severe multivessel disease. Postoperative atrial fibrillation, resolved DIABETIC  RETINOPATHY (ICD-250.50) TRIGGER FINGER (ICD-727.03) CARPAL TUNNEL SYNDROME, BILATERAL (ICD-354.0) MIGRAINE W/O AURA W/INTRACT W/STATUS MIGRAINOSUS (ICD-346.13) URI (ICD-465.9) COMMON MIGRAINE (ICD-346.10) CHOLELITHIASIS (ICD-574.20)  ESSENTIAL HYPERTENSION (ICD-401.9) ARTHRITIS, RHEUMATOID (ICD-714.0) ALLERGIC RHINITIS, SEASONAL (ICD-477.0) DIABETES MELLITUS, TYPE I (ICD-250.01)   DM type I - Dr Roanna Raider RA - Dr Corliss Skains Gyn:  Dr Edward Jolly  Past Surgical History: Last updated: 07/17/2010 CABG June 07, 2009 vitrectomy Caesarean section Carpal tunnel release Cholecystectomy  Family History: Last updated: 07/17/2010 father-type 2 DM,heart  dz,hyperlipdemia,HTN mother-depression,hyperlipidemia,migraines grandmother-HTN,CVA,migraines  Social History: Last updated: 07/17/2010 Married to [place]. Has 2 kids(fraternal twins) age 35. 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 Drinks diet coke and 4 cups of coffee daily. No exercise.   Risk Factors: Smoking Status: never (10/16/2006)  Review of Systems       As noted in the history of present illness.  She denies melena or hematochezia.  She denies dysuria or hematuria.  She denies rashes.  She denies fevers or cough.  All other systems reviewed and negative.  Vital Signs:  Patient profile:   45 year old female Height:      63 inches Weight:      142 pounds BMI:     25.25 Pulse rate:   118 / minute Pulse (ortho):   112 / minute BP sitting:   118 / 81  (left arm) BP standing:   112 / 80 Cuff size:   regular  Vitals Entered By: Caralee Ates CMA (November 08, 2010 12:10 PM)  Serial Vital Signs/Assessments:  Time      Position  BP       Pulse  Resp  Temp     By           Lying LA  118/77   102                   Ollen Gross, RN, BSN           Sitting   114/78   103                   Ollen Gross, RN, BSN           Standing  112/80   112                   Ollen Gross, RN, BSN           Standing  119/80   114                   Ollen Gross, Charity fundraiser, BSN           Standing  120/83   117                   Ollen Gross, Charity fundraiser, BSN  Comments: No symptoms By: Ollen Gross, RN, BSN  slightly dizzy By: Ollen Gross, RN, BSN  No symptoms By: Ollen Gross, RN, BSN  2 minutes- No symptoms By: Ollen Gross, RN, BSN  3- minutes- No symptoms By: Ollen Gross, RN, BSN    Physical Exam  General:  Well nourished, well developed in no acute distress HEENT: Normal; mucus membranes moist Neck: No JVD Cardiac:  Normal S1, S2; RRR; no murmur Lungs:  Clear to auscultation bilaterally, no wheezing,  rhonchi or rales Abd: Soft, nontender, no hepatomegaly, no bruits Ext: No clubbing, cyanosis or edema Vascular: No carotid  bruits Skin: Warm and dry MSK:  No deformities Lymph: No adenopathy Endocrine:  No thyromegaly Neuro: CNs 2-12 intact; nonfocal    EKG  Procedure date:  11/08/2010  Findings:      Sinus tachycardia with rate of:  106 normal axis low voltage no ischemic changes   Impression & Recommendations:  Problem # 1:  SINUS TACHYCARDIA (ICD-427.89)  Etiology not clear. Question if related to dehydration from recent diarrhea. Her heart rate goes up 15 beats with lying to standing.  Check BMET, LFTs, TSH, CBC.  Orders: TLB-BMP (Basic Metabolic Panel-BMET) (80048-METABOL) TLB-Hepatic/Liver Function Pnl (80076-HEPATIC) TLB-CBC Platelet - w/Differential (85025-CBCD) TLB-TSH (Thyroid Stimulating Hormone) (84443-TSH)  Problem # 2:  CAD, ARTERY BYPASS GRAFT (ICD-414.04)  Will obtain stress myoview as noted below.  Problem # 3:  SHORTNESS OF BREATH (ICD-786.05)  She is having DOE. She likely is feeling dyspneic due to her elevated heart rate which may all be from mild dehydration with her recent diarrhea. However, with diabetes, she is at risk for graft failure. EKG is unremarkable today except for sinus tachy. I will set her up for stress myoview and close follow up with Dr. Excell Seltzer or myself on a day Dr. Excell Seltzer is here.  Orders: TLB-BMP (Basic Metabolic Panel-BMET) (80048-METABOL) TLB-Hepatic/Liver Function Pnl (80076-HEPATIC) TLB-CBC Platelet - w/Differential (85025-CBCD) TLB-TSH (Thyroid Stimulating Hormone) (84443-TSH) Nuclear Stress Test (Nuc Stress Test)  Problem # 4:  DIARRHEA (ICD-787.91)  Probably antibiotic associated. Will check stool for CDiff. Suggest she eat yogurt or use over the counter probiotics. If CDiff +, will start her on flagyl.  Orders: TLB-BMP (Basic Metabolic Panel-BMET) (80048-METABOL) TLB-Hepatic/Liver Function Pnl  (80076-HEPATIC) TLB-CBC Platelet - w/Differential (85025-CBCD) TLB-TSH (Thyroid Stimulating Hormone) (84443-TSH) Stool C-Diff toxin assay- [facility] ([address]-60454)  Patient Instructions: 1)  Try to eat yogurt once a day for a few days or pick up probiotics at the pharmacy. 2)  Drink plenty of water. 3)  Return sooner if you feel worse or if you have chest pain. 4)  Your physician recommends that you schedule a follow-up appointment in: 1 week with Dr. Excell Seltzer, or Orvan Seen PA the day Dr. Excell Seltzer is in Clinic. 5)  Your physician recommends that you return for lab work [id number]: BMET,CBC,TSH,LFT and stool for C-Diff toxin. 6)  Your physician has requested that you have an exercise stress myoview.  For further information please visit [email or web address]  Please follow instruction sheet, as given. in a week prior F/U visit.

## 2011-01-30 NOTE — Assessment & Plan Note (Signed)
Summary: per check out   Visit Type:  Follow-up Referring Provider:  Dr Tyrone Sage Primary Provider:  Seymour Bars D.O.  CC:  The pt saw Dr. Johney Frame on 7/13 for SOB/cough/cp. She was put on a 5 day course of lasix. She states he edema did improve on the medication- but it is starting to come back to her lower extremities.  History of Present Illness: This is a 45 year old woman with coronary artery disease status post CABG. She initially presented with an inferior wall myocardial infarction in the spring of 2010. She was treated with a drug-eluting stent to the right coronary artery. At that time, she was noted to have total occlusion of the LAD. She ultimately underwent multivessel coronary bypass by Dr. Tyrone Sage in June of 2010.   She was seen last week by Dr Johney Frame with symptoms concerning for CHF. She was given a short course of oral furosemide wiht some improvement in her leg swelling. She reports mild leg swelling again now that furosemide has been completed.  She denies orthopnea, PND, or exertional dyspnea, but complains of generalized fatigue. Denies chest pain or other complaints.  She is scheduled for a hysterectomy next week for treatment of endometriosis with chronic pain and heavy menstrual bleeding.  Current Medications (verified): 1)  Humalog 100 Unit/ml Soln (Insulin Lispro (Human)) .... 50-60units By Pump Once Daily 2)  Adult Aspirin Low Strength 81 Mg  Tbdp (Aspirin) .Marland Kitchen.. 1 Tab By Mouth Daily 3)  Imitrex 100 Mg  Tabs (Sumatriptan Succinate) .Marland Kitchen.. 1 Tab By Mouth X1 As Needed Migraine and Repeat in 2 Hrs X 1 If Needed 4)  Flexeril 10 Mg Tabs (Cyclobenzaprine Hcl) .... Take 1 Tablet By Mouth Once A Day At Bedtime 5)  Arava 20 Mg Tabs (Leflunomide) .... Take 1 Tablet By Mouth Once A Day 6)  Plavix 75 Mg Tabs (Clopidogrel Bisulfate) .... Take One Tablet By Mouth Daily 7)  Nitroglycerin 0.4 Mg/hr Pt24 (Nitroglycerin) .... Apply 1 Patch Each Morning and Remove At Bedtime 8)  Minimed  Pump Reservoir 3ml  Misc (Insulin Infusion Pump Supplies) .... As Directed 9)  Crestor 5 Mg Tabs (Rosuvastatin Calcium) .... Take One Tablet By Mouth Daily. 10)  Metoprolol Tartrate 25 Mg Tabs (Metoprolol Tartrate) .... Take One-Half  Tablet By Mouth Twice A Day 11)  Ramipril 2.5 Mg Caps (Ramipril) .... Take One Capsule By Mouth Daily 12)  Levoxyl 25 Mcg Tabs (Levothyroxine Sodium) .... Take 1 Tablet By Mouth Once A Day 13)  Cimzia- Arthritis Medication .... Every Other Week 14)  Betamethasone Valerate 0.1 % Oint (Betamethasone Valerate) .... Apply Twice Daily To Affected Area 15)  Keflex 500 Mg Caps (Cephalexin) .... One  Cap By Mouth Two Times A Day X 7 Days  Allergies (verified): 1)  ! Compazine 2)  ! * Shell Fish  Past History:  Past medical history reviewed for relevance to current acute and chronic problems.  Past Medical History: Reviewed history from 10/17/2009 and no changes required. CAD with inferior MI treated with a drug-eluting stent, and ultimately coronary bypass surgery to treat severe multivessel disease. Postoperative atrial fibrillation, resolved DIABETIC  RETINOPATHY (ICD-250.50) TRIGGER FINGER (ICD-727.03) CARPAL TUNNEL SYNDROME, BILATERAL (ICD-354.0) MIGRAINE W/O AURA W/INTRACT W/STATUS MIGRAINOSUS (ICD-346.13) URI (ICD-465.9) COMMON MIGRAINE (ICD-346.10) CHOLELITHIASIS (ICD-574.20) ESSENTIAL HYPERTENSION (ICD-401.9) ARTHRITIS, RHEUMATOID (ICD-714.0) ALLERGIC RHINITIS, SEASONAL (ICD-477.0) DIABETES MELLITUS, TYPE I (ICD-250.01)   DM type I - Dr Roanna Raider RA - Dr Corliss Skains Gyn:  Dr Edward Jolly  Review of Systems  Negative except as per HPI   Vital Signs:  Patient profile:   45 year old female Height:      63 inches Weight:      159.75 pounds Pulse rate:   72 / minute Pulse rhythm:   regular BP sitting:   110 / 66  (left arm)  Vitals Entered By: Sherri Rad, RN, BSN (July 24, 2010 4:17 PM)  Physical Exam  General:  Pt is alert and oriented, in  no acute distress. HEENT: normal Neck: normal carotid upstrokes without bruits, JVP normal Lungs: CTA CV: RRR without murmur or gallop Abd: soft, NT, positive BS, no bruit, no organomegaly Ext: no clubbing, cyanosis, or edema. peripheral pulses 2+ and equal Skin: warm and dry without rash    Echocardiogram  Procedure date:  07/20/2010  Findings:      Study Conclusions            - Left ventricle: The cavity size was normal. Wall thickness was       normal. Systolic function was normal. The estimated ejection       fraction was in the range of 60% to 65%. Wall motion was normal;       there were no regional wall motion abnormalities. Features are       consistent with a pseudonormal left ventricular filling pattern,       with concomitant abnormal relaxation and increased filling       pressure (grade 2 diastolic dysfunction).     - Aortic valve: There was no stenosis.     - Mitral valve: Trivial regurgitation.     - Left atrium: The atrium was at the upper limits of normal in size.     - Right ventricle: The cavity size was normal. Systolic function was       normal.     - Pulmonary arteries: PA peak pressure: 25mm Hg (S).     - Inferior vena cava: The vessel was normal in size; the       respirophasic diameter changes were in the normal range (= 50%);       findings are consistent with normal central venous pressure.     Impressions:            - Normal LV size and systolic function, EF 60-65%. No significant       valvular dysfunction. Normal RV size and systolic function. Normal       PA pressure. Moderate diastolic dysfunction by doppler parameters       but I am not certain what to make of this finding as the remainder       of the echo was fairly normal.  Impression & Recommendations:  Problem # 1:  CAD, ARTERY BYPASS GRAFT (ICD-414.04) The patient is stable without angina. Plavix is on hold for surgery, but this should be ok since she has undergone CABG subsequent  to her stenting procedure.  She will ocntinue on low-dose ASA throughout.  Her updated medication list for this problem includes:    Adult Aspirin Low Strength 81 Mg Tbdp (Aspirin) .Marland Kitchen... 1 tab by mouth daily    Plavix 75 Mg Tabs (Clopidogrel bisulfate) .Marland Kitchen... Take one tablet by mouth daily    Nitroglycerin 0.4 Mg/hr Pt24 (Nitroglycerin) .Marland Kitchen... Apply 1 patch each morning and remove at bedtime    Metoprolol Tartrate 25 Mg Tabs (Metoprolol tartrate) .Marland Kitchen... Take one-half  tablet by mouth twice a day    Ramipril 2.5 Mg Caps (Ramipril) .Marland KitchenMarland KitchenMarland KitchenMarland Kitchen  Take one capsule by mouth daily  Problem # 2:  EDEMA (ICD-782.3) Reviewed echo, showing minor diastolic abnormalities, but I'm not sure this is related to her leg edema. No other signs of CHF and LVEF is preserved. BNP was normal. Will write for oral furosemide on an as needed basis.  Problem # 3:  HYPERLIPIDEMIA-MIXED (ICD-272.4) Lipids at goal.  Her updated medication list for this problem includes:    Crestor 5 Mg Tabs (Rosuvastatin calcium) .Marland Kitchen... Take one tablet by mouth daily.  CHOL: 117 (02/21/2010)   LDL: 49 (02/21/2010)   HDL: 59.80 (02/21/2010)   TG: 42.0 (02/21/2010)  Patient Instructions: 1)  Your physician recommends that you schedule a follow-up appointment in: 3 months. 2)  Take lasix 40mg  as needed if your weight is up 3 lbs.

## 2011-01-30 NOTE — Assessment & Plan Note (Signed)
Summary: 4 month rov   Visit Type:  4 months follow up Referring Provider:  Dr Tyrone Sage Primary Provider:  Seymour Bars D.O.  CC:  Still dizzy but every once in awhile.  History of Present Illness: This is a 45 year old woman with coronary artery disease status post CABG. She initially presented with an inferior wall myocardial infarction in the spring of 2010. She was treated with a drug-eluting stent to the right coronary artery. At that time, she was noted to have total occlusion of the LAD. She ultimately underwent multivessel coronary bypass by Dr. Tyrone Sage in June of 2010.   She complains of generalized fatigue. This is longstanding and has been present ever since her MI. Rare lightheadedness. She had a single episode of chest pain and took one NTG. No other problems reported.  She is working full-time as a Engineer, civil (consulting) at outpatient surgery.    Current Medications (verified): 1)  Humalog 100 Unit/ml Soln (Insulin Lispro (Human)) .... 50-60units Subcutaneously Daily 2)  Adult Aspirin Low Strength 81 Mg  Tbdp (Aspirin) .Marland Kitchen.. 1 Tab By Mouth Daily 3)  Imitrex 100 Mg  Tabs (Sumatriptan Succinate) .Marland Kitchen.. 1 Tab By Mouth X1 As Needed Migraine and Repeat in 2 Hrs X 1 If Needed 4)  Flexeril 10 Mg Tabs (Cyclobenzaprine Hcl) .... Take 1 Tablet By Mouth Once A Day At Bedtime 5)  Arava 20 Mg Tabs (Leflunomide) .... Take 1 Tablet By Mouth Once A Day 6)  Plavix 75 Mg Tabs (Clopidogrel Bisulfate) .... Take One Tablet By Mouth Daily 7)  Nitroglycerin 0.4 Mg/hr Pt24 (Nitroglycerin) .... Apply 1 Patch Each Morning and Remove At Bedtime 8)  Minimed Pump Reservoir 3ml  Misc (Insulin Infusion Pump Supplies) .... As Directed 9)  Crestor 5 Mg Tabs (Rosuvastatin Calcium) .... Take One Tablet By Mouth Daily. 10)  Metoprolol Tartrate 25 Mg Tabs (Metoprolol Tartrate) .... Take One-Half  Tablet By Mouth Twice A Day 11)  Ramipril 2.5 Mg Caps (Ramipril) .... Take One Capsule By Mouth Daily 12)  Levoxyl 25 Mcg Tabs  (Levothyroxine Sodium) .... Take 1 Tablet By Mouth Once A Day 13)  Cimzia- Arthritis Medication .... Every Other Week  Allergies: 1)  ! Compazine 2)  ! * Shell Fish  Past History:  Past medical history reviewed for relevance to current acute and chronic problems.  Past Medical History: Reviewed history from 10/17/2009 and no changes required. CAD with inferior MI treated with a drug-eluting stent, and ultimately coronary bypass surgery to treat severe multivessel disease. Postoperative atrial fibrillation, resolved DIABETIC  RETINOPATHY (ICD-250.50) TRIGGER FINGER (ICD-727.03) CARPAL TUNNEL SYNDROME, BILATERAL (ICD-354.0) MIGRAINE W/O AURA W/INTRACT W/STATUS MIGRAINOSUS (ICD-346.13) URI (ICD-465.9) COMMON MIGRAINE (ICD-346.10) CHOLELITHIASIS (ICD-574.20) ESSENTIAL HYPERTENSION (ICD-401.9) ARTHRITIS, RHEUMATOID (ICD-714.0) ALLERGIC RHINITIS, SEASONAL (ICD-477.0) DIABETES MELLITUS, TYPE I (ICD-250.01)   DM type I - Dr Roanna Raider RA - Dr Corliss Skains Gyn:  Dr Edward Jolly  Review of Systems       Negative except as per HPI   Vital Signs:  Patient profile:   45 year old female Height:      63 inches Weight:      155.25 pounds BMI:     27.60 Pulse rate:   72 / minute Pulse rhythm:   regular Resp:     18 per minute BP sitting:   100 / 60  (left arm) Cuff size:   large  Vitals Entered By: Vikki Ports (February 21, 2010 9:06 AM)  Physical Exam  General:  Pt is alert and oriented, young woman, in  no acute distress. HEENT: normal Neck: normal carotid upstrokes without bruits, JVP normal Lungs: CTA CV: RRR without murmur or gallop Abd: soft, NT, positive BS, no bruit, no organomegaly Ext: no clubbing, cyanosis, or edema. peripheral pulses 2+ and equal Skin: warm and dry without rash    Impression & Recommendations:  Problem # 1:  CAD, ARTERY BYPASS GRAFT (ICD-414.04) Stable without angina. Continue ASA/Plavix because of DES in the RCA. Should have good long-term prognosis  with LIMA to LAD graft and preserved LV function. No changes to medical therapy today - f/u 6 months.  Her updated medication list for this problem includes:    Adult Aspirin Low Strength 81 Mg Tbdp (Aspirin) .Marland Kitchen... 1 tab by mouth daily    Plavix 75 Mg Tabs (Clopidogrel bisulfate) .Marland Kitchen... Take one tablet by mouth daily    Nitroglycerin 0.4 Mg/hr Pt24 (Nitroglycerin) .Marland Kitchen... Apply 1 patch each morning and remove at bedtime    Metoprolol Tartrate 25 Mg Tabs (Metoprolol tartrate) .Marland Kitchen... Take one-half  tablet by mouth twice a day    Ramipril 2.5 Mg Caps (Ramipril) .Marland Kitchen... Take one capsule by mouth daily  Orders: TLB-Lipid Panel (80061-LIPID) TLB-Hepatic/Liver Function Pnl (80076-HEPATIC)  Problem # 2:  HYPERLIPIDEMIA-MIXED (ICD-272.4) Lipids have been at goal. Repeat today and if lft's normal, will f/u again in one year.  Her updated medication list for this problem includes:    Crestor 5 Mg Tabs (Rosuvastatin calcium) .Marland Kitchen... Take one tablet by mouth daily.  Orders: TLB-Lipid Panel (80061-LIPID) TLB-Hepatic/Liver Function Pnl (80076-HEPATIC)  CHOL: 133 (10/17/2009)   LDL: 68 (10/17/2009)   HDL: 52.70 (10/17/2009)   TG: 61.0 (10/17/2009)  Patient Instructions: 1)  Your physician recommends that you have a FASTING LIPID and LIVER Profile today.  2)  Your physician recommends that you continue on your current medications as directed. Please refer to the Current Medication list given to you today. 3)  Your physician wants you to follow-up in:   6 MONTHS. You will receive a reminder letter in the mail two months in advance. If you don't receive a letter, please call our office to schedule the follow-up appointment.

## 2011-01-30 NOTE — Assessment & Plan Note (Signed)
Summary: sob, swelling, coughing/lwb   Visit Type:  Follow-up Referring Provider:  Dr Tyrone Sage Primary Provider:  Seymour Bars D.O.   History of Present Illness: [name] is a pleasant 45 yo WF with a h/o diabetes, rheumatoid arthritis and CAD s/p CABG who presents today for acute visit complaining of swelling in her hands and feet as well as mild orthopnea in the early ams.  She reports that these symptoms have been present for several days.  She denies CP, SOB, palpitations, presyncope, or syncope.  She denies recent changes in diet or salt consumption.  She has not used NSAIDs recently and is unaware of an etiology for her symptoms.  Her past echo revealed  preserved EF.  Current Medications (verified): 1)  Humalog 100 Unit/ml Soln (Insulin Lispro (Human)) .... 50-60units Subcutaneously Daily 2)  Adult Aspirin Low Strength 81 Mg  Tbdp (Aspirin) .Marland Kitchen.. 1 Tab By Mouth Daily 3)  Imitrex 100 Mg  Tabs (Sumatriptan Succinate) .Marland Kitchen.. 1 Tab By Mouth X1 As Needed Migraine and Repeat in 2 Hrs X 1 If Needed 4)  Flexeril 10 Mg Tabs (Cyclobenzaprine Hcl) .... Take 1 Tablet By Mouth Once A Day At Bedtime 5)  Arava 20 Mg Tabs (Leflunomide) .... Take 1 Tablet By Mouth Once A Day 6)  Plavix 75 Mg Tabs (Clopidogrel Bisulfate) .... Take One Tablet By Mouth Daily 7)  Nitroglycerin 0.4 Mg/hr Pt24 (Nitroglycerin) .... Apply 1 Patch Each Morning and Remove At Bedtime 8)  Minimed Pump Reservoir 3ml  Misc (Insulin Infusion Pump Supplies) .... As Directed 9)  Crestor 5 Mg Tabs (Rosuvastatin Calcium) .... Take One Tablet By Mouth Daily. 10)  Metoprolol Tartrate 25 Mg Tabs (Metoprolol Tartrate) .... Take One-Half  Tablet By Mouth Twice A Day 11)  Ramipril 2.5 Mg Caps (Ramipril) .... Take One Capsule By Mouth Daily 12)  Levoxyl 25 Mcg Tabs (Levothyroxine Sodium) .... Take 1 Tablet By Mouth Once A Day 13)  Cimzia- Arthritis Medication .... Every Other Week  Allergies (verified): 1)  ! Compazine 2)  ! * Shell  Fish  Past History:  Past Medical History: Reviewed history from 10/17/2009 and no changes required. CAD with inferior MI treated with a drug-eluting stent, and ultimately coronary bypass surgery to treat severe multivessel disease. Postoperative atrial fibrillation, resolved DIABETIC  RETINOPATHY (ICD-250.50) TRIGGER FINGER (ICD-727.03) CARPAL TUNNEL SYNDROME, BILATERAL (ICD-354.0) MIGRAINE W/O AURA W/INTRACT W/STATUS MIGRAINOSUS (ICD-346.13) URI (ICD-465.9) COMMON MIGRAINE (ICD-346.10) CHOLELITHIASIS (ICD-574.20) ESSENTIAL HYPERTENSION (ICD-401.9) ARTHRITIS, RHEUMATOID (ICD-714.0) ALLERGIC RHINITIS, SEASONAL (ICD-477.0) DIABETES MELLITUS, TYPE I (ICD-250.01)   DM type I - Dr Roanna Raider RA - Dr Corliss Skains Gyn:  Dr Edward Jolly  Past Surgical History: Reviewed history from 10/17/2009 and no changes required. CABG June 07, 2009 vitrectomy Caesarean section Carpal tunnel release Cholecystectomy  Social History: Reviewed history from 10/16/2006 and no changes required. Married to [employer]. Has 2 kids(fraternal twins) age 28. 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 Drinks diet coke and 4 cups of coffee daily. No exercise.   Review of Systems       All systems are reviewed and negative except as listed in the HPI.   Vital Signs:  Patient profile:   45 year old female Height:      63 inches Weight:      161 pounds BMI:     28.62 Pulse rate:   84 / minute BP sitting:   114 / 64  (left arm)  Vitals Entered By: Laurance Flatten CMA (July 12, 2010 2:58 PM)  Physical Exam  General:  Well developed, well nourished, in no acute distress. Head:  normocephalic and atraumatic Eyes:  PERRLA/EOM intact; conjunctiva and lids normal. Mouth:  Teeth, gums and palate normal. Oral mucosa normal. Neck:  supple, JVP 10cm Lungs:  few basilar rales, otherwise clear Heart:  RRR, no m/r/g Abdomen:  Bowel sounds positive; abdomen  soft and non-tender without masses, organomegaly, or hernias noted. No hepatosplenomegaly. Msk:  Back normal, normal gait. Muscle strength and tone normal. Pulses:  pulses normal in all 4 extremities Extremities:  mild swelling of hands and feet Neurologic:  Alert and oriented x 3. Skin:  Intact without lesions or rashes. Cervical Nodes:  no significant adenopathy Psych:  Normal affect.   EKG  Procedure date:  07/12/2010  Findings:      sinus rhythm 84 bpm, PR 128, no ischemic changes  Impression & Recommendations:  Problem # 1:  EDEMA (ICD-782.3) The patient has mild but acute CHF.  Her weight has increased 6 lbs since last visit to Dr Excell Seltzer (2/11).  She is unware of salt increases, NSAIDS, or other causes for her fluid retention.  I have recommended salt restriction.  In addition, we will start Lasix 40mg  daily x 5 days.  She will perform daily weights and contact our office if her weight increases or she develops worsening symptoms. We will obtain a BMET and BNP today.  We will also obtain a TTE to evaluate for changes in EF or other cardia causes of edema. She will return to see Dr Excell Seltzer at his next available appointment.  Problem # 2:  CAD, ARTERY BYPASS GRAFT (ICD-414.04) no symptoms of ischemia continue current medicine regimen  Problem # 3:  HYPERLIPIDEMIA-MIXED (ICD-272.4) stable Her updated medication list for this problem includes:    Crestor 5 Mg Tabs (Rosuvastatin calcium) .Marland Kitchen... Take one tablet by mouth daily.  Problem # 4:  ESSENTIAL HYPERTENSION (ICD-401.9) stable no change  Other Orders: TLB-BMP (Basic Metabolic Panel-BMET) (80048-METABOL) TLB-BNP (B-Natriuretic Peptide) (83880-BNPR) Echocardiogram (Echo)  Patient Instructions: 1)  Your physician recommends that you schedule a follow-up appointment in: next week, with Jacolyn Reedy PA 2)  Your physician recommends that you return for lab work in: Today 3)  Your physician has recommended you make the  following change in your medication: Lasix 40mg  1 daily for 5 days 4)  Your physician has requested that you have an echocardiogram.  Echocardiography is a painless test that uses sound waves to create images of your heart. It provides your doctor with information about the size and shape of your heart and how well your heart's chambers and valves are working.  This procedure takes approximately one hour. There are no restrictions for this procedure. Prescriptions: FUROSEMIDE 40 MG TABS (FUROSEMIDE) 1 by mouth daily  #30 x 0   Entered by:   Dennis Bast, RN, BSN   Authorized by:   Hillis Range, MD   Signed by:   Dennis Bast, RN, BSN on 07/12/2010   Method used:   Electronically to        [facility]* (retail)       [address]       [address] Shipping/mailing       [place], [place]  [address]       Ph: [phone]       Fax: [phone]   RxID:   [id number]

## 2011-01-30 NOTE — Progress Notes (Signed)
Summary: heart rate still up  Phone Note Call from Patient   Caller: Patient  [phone] Reason for Call: Talk to Nurse Summary of Call: pt heart rate is up still after increase of metoprolol on saturday Initial call taken by: Glynda Jaeger,  November 13, 2010 3:44 PM  Follow-up for Phone Call        The pt's resting heart rate is 120.   The pt said she got up and walked around and her pulse went to 136.  The pt asked if she could take an extra Metoprolol if needed.  I advised the pt that she could take an extra Metoprolol is her tachycardia is very bothersome.  The pt said her new Endocrinologist is Dr Talmage Coin.  I will fax the pt's labs to his attention.  The pt said she has already touched base with his office and they are waiting on results.  (phone [phone]) Follow-up by: Julieta Gutting, RN, BSN,  November 13, 2010 5:15 PM  Additional Follow-up for Phone Call Additional follow up Details #1::        Fax [phone].  11/08/10 labs and 11/08/10 Office note faxed to Dr Sharl Ma per the pt's request.  Additional Follow-up by: Julieta Gutting, RN, BSN,  November 14, 2010 4:04 PM

## 2011-01-30 NOTE — Assessment & Plan Note (Signed)
Summary: Cardiology Nuclear Testing  Nuclear Med Background Indications for Stress Test: Evaluation for Ischemia, Stent Patency  Indications Comments: Ischemia/Hyperthyroidism  History: CABG, Echo, Heart Catheterization, Myocardial Infarction, Stents  History Comments: 3/10 IWMI>STENT-RCA;05/02/09 Cath:patent stent, totalled LAD, normal LVF  Symptoms: Chest Pain, Chest Pressure, Dizziness, DOE, Fatigue, Light-Headedness, Nausea, Palpitations, Rapid HR    Nuclear Pre-Procedure Cardiac Risk Factors: Hypertension, IDDM Type 1, Lipids, Overweight Caffeine/Decaff Intake: none NPO After: 6:30 PM Lungs: clear IV 0.9% NS with Angio Cath: 22g     IV Site: R Hand IV Started by: Cathlyn Parsons, RN Chest Size (in) 36     Cup Size c     Height (in): 63 Weight (lb): 146 BMI: 25.96 Tech Comments: Metoprolol held x 12 hrs. Insulin pump on hold and BS 124 at home this am.  Nuclear Med Study 1 or 2 day study:  1 day     Stress Test Type:  Stress Reading MD:  Charlton Haws, MD     Referring MD:  Tonny Bollman, MD Resting Radionuclide:  Technetium 10m Tetrofosmin     Resting Radionuclide Dose:  11 mCi  Stress Radionuclide:  Technetium 62m Tetrofosmin     Stress Radionuclide Dose:  33 mCi   Stress Protocol Exercise Time (min):  7:00 min     Max HR:  164 bpm     Predicted Max HR:  177 bpm  Max Systolic BP: 147 mm Hg     Percent Max HR:  92.66 %     METS: 8.5 Rate Pressure Product:  16109    Stress Test Technologist:  Milana Na, EMT-P     Nuclear Technologist:  Doyne Keel, CNMT  Rest Procedure  Myocardial perfusion imaging was performed at rest 45 minutes following the intravenous administration of Technetium 55m Tetrofosmin.  Stress Procedure  The patient exercised for 7:00. The patient stopped due to fatigue and chest tightness.  There were no significant ST-T wave changes.  Technetium 17m Tetrofosmin was injected at peak exercise and myocardial perfusion imaging was performed after  a brief delay.  QPS Raw Data Images:  Normal; no motion artifact; normal heart/lung ratio. Stress Images:  Normal homogeneous uptake in all areas of the myocardium. Rest Images:  Normal homogeneous uptake in all areas of the myocardium. Subtraction (SDS):  SDS 4 scattered at base Transient Ischemic Dilatation:  0.90  (Normal <1.22)  Lung/Heart Ratio:  0.40  (Normal <0.45)  Quantitative Gated Spect Images QGS EDV:  46 ml QGS ESV:  5 ml QGS EF:  89 % QGS cine images:  normal  Findings Normal nuclear study      Overall Impression  Exercise Capacity: Fair exercise capacity. BP Response: Normal blood pressure response. Clinical Symptoms: Chest Tightness ECG Impression: Poor R wave progression Overall Impression: Normal stress nuclear study.  Appended Document: Cardiology Nuclear Testing please notify patient that her Celine Ahr is normal Tereso Newcomer PA-C  November 16, 2010 5:22 PM  Pt aware of results by phone.  The pt was diagnosed with Graves Disease today.  The pt would like to cancel her follow-up appointment next week with Tereso Newcomer PA-C.  Julieta Gutting, RN, BSN  November 17, 2010 2:49 PM       Clinical Lists Changes  Observations: Added new observation of PAST MED HX: CAD with inferior MI treated with a drug-eluting stent, and ultimately coronary bypass surgery to treat severe multivessel disease.   a. stress myoview 10/2010:  negative for ischemia Postoperative atrial fibrillation, resolved DIABETIC  RETINOPATHY (  ICD-250.50) TRIGGER FINGER (ICD-727.03) CARPAL TUNNEL SYNDROME, BILATERAL (ICD-354.0) MIGRAINE W/O AURA W/INTRACT W/STATUS MIGRAINOSUS (ICD-346.13) URI (ICD-465.9) COMMON MIGRAINE (ICD-346.10) CHOLELITHIASIS (ICD-574.20) ESSENTIAL HYPERTENSION (ICD-401.9) ARTHRITIS, RHEUMATOID (ICD-714.0) ALLERGIC RHINITIS, SEASONAL (ICD-477.0) DIABETES MELLITUS, TYPE I (ICD-250.01)   DM type I - Dr Roanna Raider RA - Dr Corliss Skains Gyn:  Dr Edward Jolly   (11/16/2010  17:21)        Past History:  Past Medical History: CAD with inferior MI treated with a drug-eluting stent, and ultimately coronary bypass surgery to treat severe multivessel disease.   a. stress myoview 10/2010:  negative for ischemia Postoperative atrial fibrillation, resolved DIABETIC  RETINOPATHY (ICD-250.50) TRIGGER FINGER (ICD-727.03) CARPAL TUNNEL SYNDROME, BILATERAL (ICD-354.0) MIGRAINE W/O AURA W/INTRACT W/STATUS MIGRAINOSUS (ICD-346.13) URI (ICD-465.9) COMMON MIGRAINE (ICD-346.10) CHOLELITHIASIS (ICD-574.20) ESSENTIAL HYPERTENSION (ICD-401.9) ARTHRITIS, RHEUMATOID (ICD-714.0) ALLERGIC RHINITIS, SEASONAL (ICD-477.0) DIABETES MELLITUS, TYPE I (ICD-250.01)   DM type I - Dr Roanna Raider RA - Dr Corliss Skains Gyn:  Dr Edward Jolly

## 2011-01-30 NOTE — Progress Notes (Signed)
Summary: pt rate is 110 to 120  Phone Note Call from Patient Call back at Work Phone [phone]   Caller: Patient Reason for Call: Talk to Nurse Summary of Call: pt heart rate is 110 to 120 pt has sob if she walks or moving fast. no chest pain.  Initial call taken by: Roe Coombs,  November 08, 2010 9:23 AM  Follow-up for Phone Call        spoke w/pt and she works at [employer] long and they did a 3 lead ekg and shows sinus tach not a-fib. pt states she has also been sweating which is unusual. shob w/activity. denies chest pain.  has taken all meds this am. pt to come in to see pa at noon.  Follow-up by: Claris Gladden RN,  November 08, 2010 9:57 AM  Additional Follow-up for Phone Call Additional follow up Details #1::        pt bp 134/79, blood sugar is 179. pt states this has been going on for a week.  Additional Follow-up by: Claris Gladden RN,  November 08, 2010 10:10 AM    Additional Follow-up for Phone Call Additional follow up Details #2::    [name] seen in office today.  Follow-up by: Tereso Newcomer PA-C,  November 08, 2010 2:45 PM

## 2011-01-30 NOTE — Assessment & Plan Note (Signed)
Summary: sinusitis   Vital Signs:  Patient profile:   45 year old female Height:      63 inches Weight:      152 pounds BMI:     27.02 O2 Sat:      98 % on Room air Temp:     98.5 degrees F oral Pulse rate:   90 / minute BP sitting:   122 / 71  (left arm) Cuff size:   regular  Vitals Entered By: Payton Spark CMA (October 16, 2010 10:51 AM)  O2 Flow:  Room air CC: L ear pain, congestion, facial pressure x 1 week.    Primary Care Provider:  Seymour Bars D.O.  CC:  L ear pain, congestion, and facial pressure x 1 week. Marland Kitchen  History of Present Illness: 45 yo WF presents for L ear pain x 4 days.  She has been congested in her head without rhinorrhea.  Having facial pain, pressure and teeth pain.  She has had some chills but no fever.  Has a little dry cough.  Scratchy throat.  She has been sneezing a lot.  She has had not been taking anything for allergies.  Denies CP or SOB.  Had CABG this year for AMI.  She is also on immunosupressant meds for RA.  Current Medications (verified): 1)  Humalog 100 Unit/ml Soln (Insulin Lispro (Human)) .... 50-60units By Pump Once Daily 2)  Adult Aspirin Low Strength 81 Mg  Tbdp (Aspirin) .Marland Kitchen.. 1 Tab By Mouth Daily 3)  Imitrex 100 Mg  Tabs (Sumatriptan Succinate) .Marland Kitchen.. 1 Tab By Mouth X1 As Needed Migraine and Repeat in 2 Hrs X 1 If Needed 4)  Flexeril 10 Mg Tabs (Cyclobenzaprine Hcl) .... Take 1 Tablet By Mouth Once A Day At Bedtime 5)  Arava 20 Mg Tabs (Leflunomide) .... Take 1 Tablet By Mouth Once A Day 6)  Plavix 75 Mg Tabs (Clopidogrel Bisulfate) .... Take One Tablet By Mouth Daily 7)  Nitroglycerin 0.4 Mg/hr Pt24 (Nitroglycerin) .... Apply 1 Patch Each Morning and Remove At Bedtime 8)  Minimed Pump Reservoir 3ml  Misc (Insulin Infusion Pump Supplies) .... As Directed 9)  Crestor 5 Mg Tabs (Rosuvastatin Calcium) .... Take One Tablet By Mouth Daily. 10)  Metoprolol Tartrate 25 Mg Tabs (Metoprolol Tartrate) .... Take One-Half  Tablet By Mouth Twice A  Day 11)  Ramipril 2.5 Mg Caps (Ramipril) .... Take One Capsule By Mouth Daily 12)  Levoxyl 25 Mcg Tabs (Levothyroxine Sodium) .... Take 1 Tablet By Mouth Once A Day 13)  Cimzia- Arthritis Medication .... Every Other Week  Allergies (verified): 1)  ! Compazine 2)  ! * Shell Fish  Past History:  Past Medical History: Reviewed history from 10/17/2009 and no changes required. CAD with inferior MI treated with a drug-eluting stent, and ultimately coronary bypass surgery to treat severe multivessel disease. Postoperative atrial fibrillation, resolved DIABETIC  RETINOPATHY (ICD-250.50) TRIGGER FINGER (ICD-727.03) CARPAL TUNNEL SYNDROME, BILATERAL (ICD-354.0) MIGRAINE W/O AURA W/INTRACT W/STATUS MIGRAINOSUS (ICD-346.13) URI (ICD-465.9) COMMON MIGRAINE (ICD-346.10) CHOLELITHIASIS (ICD-574.20) ESSENTIAL HYPERTENSION (ICD-401.9) ARTHRITIS, RHEUMATOID (ICD-714.0) ALLERGIC RHINITIS, SEASONAL (ICD-477.0) DIABETES MELLITUS, TYPE I (ICD-250.01)   DM type I - Dr Roanna Raider RA - Dr Corliss Skains Gyn:  Dr Edward Jolly  Social History: Reviewed history from 07/17/2010 and no changes required. Married to [employer]. Has 2 kids(fraternal twins) age 69. 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 Drinks diet coke and 4 cups of coffee daily.  No exercise.   Physical Exam  General:  alert, well-developed, well-nourished, and well-hydrated.   Head:  normocephalic and atraumatic.  frontal and maxillary sinuses TTP with minimal edema under both eyes Eyes:  conjunctiva clear Ears:  EACs patent; TMs translucent and gray with good cone of light and bony landmarks.  Nose:  nasal congestion Mouth:  pharynx pink and moist.  o/p injected with cobblestoning Neck:  no masses.   Lungs:  Normal respiratory effort, chest expands symmetrically. Lungs are clear to auscultation, no crackles or wheezes. Heart:  Normal rate and regular rhythm. S1 and S2 normal  without gallop, murmur, click, rub or other extra sounds. Skin:  color normal.   Cervical Nodes:  shotty tender anterior cervical chain LA   Impression & Recommendations:  Problem # 1:  ACUTE MAXILLARY SINUSITIS (ICD-461.0) Will treat iwth Augmentin x 10 days along with plain Mucinex and Nasal saline.  Call if not improved in 10 days. The following medications were removed from the medication list:    Keflex 500 Mg Caps (Cephalexin) ..... One  cap by mouth two times a day x 7 days Her updated medication list for this problem includes:    Amoxicillin-pot Clavulanate 875-125 Mg Tabs (Amoxicillin-pot clavulanate) .Marland Kitchen... 1 tab by mouth two times a day with food x 10 days  Complete Medication List: 1)  Humalog 100 Unit/ml Soln (Insulin lispro (human)) .... 50-60units by pump once daily 2)  Adult Aspirin Low Strength 81 Mg Tbdp (Aspirin) .Marland Kitchen.. 1 tab by mouth daily 3)  Imitrex 100 Mg Tabs (Sumatriptan succinate) .Marland Kitchen.. 1 tab by mouth x1 as needed migraine and repeat in 2 hrs x 1 if needed 4)  Flexeril 10 Mg Tabs (Cyclobenzaprine hcl) .... Take 1 tablet by mouth once a day at bedtime 5)  Arava 20 Mg Tabs (Leflunomide) .... Take 1 tablet by mouth once a day 6)  Plavix 75 Mg Tabs (Clopidogrel bisulfate) .... Take one tablet by mouth daily 7)  Nitroglycerin 0.4 Mg/hr Pt24 (Nitroglycerin) .... Apply 1 patch each morning and remove at bedtime 8)  Minimed Pump Reservoir 3ml Misc (Insulin infusion pump supplies) .... As directed 9)  Crestor 5 Mg Tabs (Rosuvastatin calcium) .... Take one tablet by mouth daily. 10)  Metoprolol Tartrate 25 Mg Tabs (Metoprolol tartrate) .... Take one-half  tablet by mouth twice a day 11)  Ramipril 2.5 Mg Caps (Ramipril) .... Take one capsule by mouth daily 12)  Levoxyl 25 Mcg Tabs (Levothyroxine sodium) .... Take 1 tablet by mouth once a day 13)  Cimzia- Arthritis Medication  .... Every other week 14)  Amoxicillin-pot Clavulanate 875-125 Mg Tabs (Amoxicillin-pot clavulanate)  .Marland Kitchen.. 1 tab by mouth two times a day with food x 10 days  Patient Instructions: 1)  Take 10 days of Augmetin with breakfast and dinner for sinusitis. 2)  Eat a little yogurt daily to prevent diarrhea and yeast infection. 3)  Use OTC plain mucinex every 12 hrs to thin out thick mucous x 10 days, then change over to plain Claritin once daily for allergies thru Fall. 4)  Use Tylenol as needed for sinus pain/ ear pain. 5)  Call if not improved in 10 days. Prescriptions: AMOXICILLIN-POT CLAVULANATE 875-125 MG TABS (AMOXICILLIN-POT CLAVULANATE) 1 tab by mouth two times a day with food x 10 days  #20 x 0   Entered and Authorized by:   Seymour Bars DO   Signed by:   Seymour Bars DO on 10/16/2010   Method used:   Electronically to  [facility]  [address] [phone]* (retail)       [address] [place], [place]  [address]       Ph: [phone] or [phone]       Fax: [phone]   RxID:   [id number]    Orders Added: 1)  Est. Patient Level III [36644]

## 2011-02-13 ENCOUNTER — Encounter: Payer: Self-pay | Admitting: Family Medicine

## 2011-03-08 NOTE — Letter (Signed)
Summary: [facility]  [facility]   Imported By: Lanelle Bal 03/01/2011 12:37:11  _____________________________________________________________________  External Attachment:    Type:   Image     Comment:   External Document

## 2011-03-16 LAB — GLUCOSE, CAPILLARY
Glucose-Capillary: 111 mg/dL — ABNORMAL HIGH (ref 70–99)
Glucose-Capillary: 127 mg/dL — ABNORMAL HIGH (ref 70–99)
Glucose-Capillary: 128 mg/dL — ABNORMAL HIGH (ref 70–99)
Glucose-Capillary: 152 mg/dL — ABNORMAL HIGH (ref 70–99)
Glucose-Capillary: 168 mg/dL — ABNORMAL HIGH (ref 70–99)
Glucose-Capillary: 171 mg/dL — ABNORMAL HIGH (ref 70–99)
Glucose-Capillary: 177 mg/dL — ABNORMAL HIGH (ref 70–99)
Glucose-Capillary: 192 mg/dL — ABNORMAL HIGH (ref 70–99)
Glucose-Capillary: 244 mg/dL — ABNORMAL HIGH (ref 70–99)
Glucose-Capillary: 74 mg/dL (ref 70–99)

## 2011-03-16 LAB — COMPREHENSIVE METABOLIC PANEL
ALT: 14 U/L (ref 0–35)
AST: 24 U/L (ref 0–37)
Alkaline Phosphatase: 46 U/L (ref 39–117)
CO2: 27 mEq/L (ref 19–32)
Calcium: 8.6 mg/dL (ref 8.4–10.5)
Chloride: 102 mEq/L (ref 96–112)
GFR calc Af Amer: >60 mL/min (ref >60)
GFR calc non Af Amer: >60 mL/min (ref >60)
Glucose, Bld: 135 mg/dL — ABNORMAL HIGH (ref 70–99)
Potassium: 4.1 mEq/L (ref 3.5–5.1)
Sodium: 135 mEq/L (ref 135–145)
Total Bilirubin: 0.6 mg/dL (ref 0.3–1.2)

## 2011-03-16 LAB — CBC
HCT: 32.3 % — ABNORMAL LOW (ref 36.0–46.0)
Hemoglobin: 11 g/dL — ABNORMAL LOW (ref 12.0–15.0)
RBC: 3.7 MIL/uL — ABNORMAL LOW (ref 3.87–5.11)
WBC: 10.3 10*3/uL (ref 4.0–10.5)

## 2011-03-16 LAB — TYPE AND SCREEN: Antibody Screen: NEGATIVE

## 2011-03-17 LAB — COMPREHENSIVE METABOLIC PANEL
Alkaline Phosphatase: 55 U/L (ref 39–117)
BUN: 8 mg/dL (ref 6–23)
GFR calc non Af Amer: >60 mL/min (ref >60)
Glucose, Bld: 115 mg/dL — ABNORMAL HIGH (ref 70–99)
Potassium: 3.4 mEq/L — ABNORMAL LOW (ref 3.5–5.1)
Total Bilirubin: 0.5 mg/dL (ref 0.3–1.2)
Total Protein: 7.1 g/dL (ref 6.0–8.3)

## 2011-03-17 LAB — URINALYSIS, ROUTINE W REFLEX MICROSCOPIC
Bilirubin Urine: NEGATIVE
Ketones, ur: NEGATIVE mg/dL
Nitrite: NEGATIVE
pH: 6 (ref 5.0–8.0)

## 2011-03-17 LAB — SURGICAL PCR SCREEN: MRSA, PCR: NEGATIVE

## 2011-03-17 LAB — PROTIME-INR
INR: 0.96 (ref 0.00–1.49)
Prothrombin Time: 12.7 seconds (ref 11.6–15.2)

## 2011-03-17 LAB — CBC
MCH: 29.3 pg (ref 26.0–34.0)
MCV: 86.1 fL (ref 78.0–100.0)
Platelets: 242 10*3/uL (ref 150–400)
RDW: 12.6 % (ref 11.5–15.5)

## 2011-04-08 LAB — GLUCOSE, CAPILLARY
Glucose-Capillary: 105 mg/dL — ABNORMAL HIGH (ref 70–99)
Glucose-Capillary: 120 mg/dL — ABNORMAL HIGH (ref 70–99)
Glucose-Capillary: 152 mg/dL — ABNORMAL HIGH (ref 70–99)
Glucose-Capillary: 171 mg/dL — ABNORMAL HIGH (ref 70–99)
Glucose-Capillary: 235 mg/dL — ABNORMAL HIGH (ref 70–99)

## 2011-04-09 LAB — BASIC METABOLIC PANEL
BUN: 10 mg/dL (ref 6–23)
BUN: 11 mg/dL (ref 6–23)
BUN: 6 mg/dL (ref 6–23)
BUN: 7 mg/dL (ref 6–23)
BUN: 7 mg/dL (ref 6–23)
BUN: 9 mg/dL (ref 6–23)
BUN: 9 mg/dL (ref 6–23)
CO2: 22 mEq/L (ref 19–32)
CO2: 24 mEq/L (ref 19–32)
CO2: 26 mEq/L (ref 19–32)
CO2: 26 mEq/L (ref 19–32)
CO2: 29 mEq/L (ref 19–32)
CO2: 30 mEq/L (ref 19–32)
CO2: 32 mEq/L (ref 19–32)
Calcium: 7.1 mg/dL — ABNORMAL LOW (ref 8.4–10.5)
Calcium: 7.2 mg/dL — ABNORMAL LOW (ref 8.4–10.5)
Calcium: 7.4 mg/dL — ABNORMAL LOW (ref 8.4–10.5)
Calcium: 7.6 mg/dL — ABNORMAL LOW (ref 8.4–10.5)
Calcium: 7.8 mg/dL — ABNORMAL LOW (ref 8.4–10.5)
Calcium: 7.9 mg/dL — ABNORMAL LOW (ref 8.4–10.5)
Calcium: 8.2 mg/dL — ABNORMAL LOW (ref 8.4–10.5)
Calcium: 8.7 mg/dL (ref 8.4–10.5)
Chloride: 100 mEq/L (ref 96–112)
Chloride: 101 mEq/L (ref 96–112)
Chloride: 104 mEq/L (ref 96–112)
Chloride: 104 mEq/L (ref 96–112)
Chloride: 105 mEq/L (ref 96–112)
Chloride: 108 mEq/L (ref 96–112)
Chloride: 108 mEq/L (ref 96–112)
Chloride: 99 mEq/L (ref 96–112)
Creatinine, Ser: 0.55 mg/dL (ref 0.4–1.2)
Creatinine, Ser: 0.59 mg/dL (ref 0.4–1.2)
Creatinine, Ser: 0.64 mg/dL (ref 0.4–1.2)
Creatinine, Ser: 0.65 mg/dL (ref 0.4–1.2)
Creatinine, Ser: 0.72 mg/dL (ref 0.4–1.2)
Creatinine, Ser: 0.72 mg/dL (ref 0.4–1.2)
Creatinine, Ser: 0.73 mg/dL (ref 0.4–1.2)
Creatinine, Ser: 0.88 mg/dL (ref 0.4–1.2)
GFR calc Af Amer: >60 mL/min (ref >60)
GFR calc Af Amer: >60 mL/min (ref >60)
GFR calc Af Amer: >60 mL/min (ref >60)
GFR calc Af Amer: >60 mL/min (ref >60)
GFR calc Af Amer: >60 mL/min (ref >60)
GFR calc Af Amer: >60 mL/min (ref >60)
GFR calc Af Amer: >60 mL/min (ref >60)
GFR calc Af Amer: >60 mL/min (ref >60)
GFR calc non Af Amer: >60 mL/min (ref >60)
GFR calc non Af Amer: >60 mL/min (ref >60)
GFR calc non Af Amer: >60 mL/min (ref >60)
GFR calc non Af Amer: >60 mL/min (ref >60)
GFR calc non Af Amer: >60 mL/min (ref >60)
GFR calc non Af Amer: >60 mL/min (ref >60)
GFR calc non Af Amer: >60 mL/min (ref >60)
Glucose, Bld: 109 mg/dL — ABNORMAL HIGH (ref 70–99)
Glucose, Bld: 154 mg/dL — ABNORMAL HIGH (ref 70–99)
Glucose, Bld: 208 mg/dL — ABNORMAL HIGH (ref 70–99)
Glucose, Bld: 267 mg/dL — ABNORMAL HIGH (ref 70–99)
Glucose, Bld: 284 mg/dL — ABNORMAL HIGH (ref 70–99)
Glucose, Bld: 59 mg/dL — ABNORMAL LOW (ref 70–99)
Glucose, Bld: 89 mg/dL (ref 70–99)
Potassium: 3.4 mEq/L — ABNORMAL LOW (ref 3.5–5.1)
Potassium: 3.4 mEq/L — ABNORMAL LOW (ref 3.5–5.1)
Potassium: 3.7 mEq/L (ref 3.5–5.1)
Potassium: 4 mEq/L (ref 3.5–5.1)
Potassium: 4.2 mEq/L (ref 3.5–5.1)
Potassium: 4.2 mEq/L (ref 3.5–5.1)
Potassium: 5.2 mEq/L — ABNORMAL HIGH (ref 3.5–5.1)
Sodium: 133 mEq/L — ABNORMAL LOW (ref 135–145)
Sodium: 133 mEq/L — ABNORMAL LOW (ref 135–145)
Sodium: 133 mEq/L — ABNORMAL LOW (ref 135–145)
Sodium: 136 mEq/L (ref 135–145)
Sodium: 137 mEq/L (ref 135–145)
Sodium: 137 mEq/L (ref 135–145)
Sodium: 140 mEq/L (ref 135–145)

## 2011-04-09 LAB — DIFFERENTIAL
Basophils Relative: 1 % (ref 0–1)
Basophils Relative: 1 % (ref 0–1)
Eosinophils Absolute: 1.1 10*3/uL — ABNORMAL HIGH (ref 0.0–0.7)
Eosinophils Absolute: 1.2 10*3/uL — ABNORMAL HIGH (ref 0.0–0.7)
Lymphs Abs: 1 10*3/uL (ref 0.7–4.0)
Monocytes Absolute: 0.3 10*3/uL (ref 0.1–1.0)
Monocytes Relative: 5 % (ref 3–12)
Neutrophils Relative %: 49 % (ref 43–77)

## 2011-04-09 LAB — CROSSMATCH
ABO/RH(D): A NEG
Antibody Screen: NEGATIVE
Antibody Screen: NEGATIVE

## 2011-04-09 LAB — POCT I-STAT, CHEM 8
Chloride: 101 mEq/L (ref 96–112)
Creatinine, Ser: 0.7 mg/dL (ref 0.4–1.2)
Glucose, Bld: 245 mg/dL — ABNORMAL HIGH (ref 70–99)
HCT: 28 % — ABNORMAL LOW (ref 36.0–46.0)
HCT: 31 % — ABNORMAL LOW (ref 36.0–46.0)
Hemoglobin: 10.5 g/dL — ABNORMAL LOW (ref 12.0–15.0)
Hemoglobin: 9.5 g/dL — ABNORMAL LOW (ref 12.0–15.0)
Potassium: 4 mEq/L (ref 3.5–5.1)
Potassium: 4.7 mEq/L (ref 3.5–5.1)
Sodium: 135 mEq/L (ref 135–145)

## 2011-04-09 LAB — POCT CARDIAC MARKERS
CKMB, poc: <1 ng/mL — ABNORMAL LOW (ref 1.0–8.0)
Myoglobin, poc: 43.2 ng/mL (ref 12–200)
Myoglobin, poc: 50.7 ng/mL (ref 12–200)

## 2011-04-09 LAB — POCT I-STAT 3, ART BLOOD GAS (G3+)
Bicarbonate: 24.2 mEq/L — ABNORMAL HIGH (ref 20.0–24.0)
Bicarbonate: 24.6 mEq/L — ABNORMAL HIGH (ref 20.0–24.0)
O2 Saturation: 100 %
O2 Saturation: 100 %
O2 Saturation: 96 %
O2 Saturation: 98 %
Patient temperature: 35.7
TCO2: 23 mmol/L (ref 0–100)
TCO2: 25 mmol/L (ref 0–100)
TCO2: 26 mmol/L (ref 0–100)
TCO2: 26 mmol/L (ref 0–100)
pCO2 arterial: 35.6 mmHg (ref 35.0–45.0)
pCO2 arterial: 36.8 mmHg (ref 35.0–45.0)
pCO2 arterial: 38.5 mmHg (ref 35.0–45.0)
pCO2 arterial: 42 mmHg (ref 35.0–45.0)
pH, Arterial: 7.368 (ref 7.350–7.400)
pH, Arterial: 7.414 — ABNORMAL HIGH (ref 7.350–7.400)
pH, Arterial: 7.44 — ABNORMAL HIGH (ref 7.350–7.400)
pO2, Arterial: 236 mmHg — ABNORMAL HIGH (ref 80.0–100.0)
pO2, Arterial: 318 mmHg — ABNORMAL HIGH (ref 80.0–100.0)
pO2, Arterial: 469 mmHg — ABNORMAL HIGH (ref 80.0–100.0)

## 2011-04-09 LAB — URINALYSIS, ROUTINE W REFLEX MICROSCOPIC
Glucose, UA: NEGATIVE mg/dL
Hgb urine dipstick: NEGATIVE
Ketones, ur: NEGATIVE mg/dL
Protein, ur: NEGATIVE mg/dL
pH: 8.5 — ABNORMAL HIGH (ref 5.0–8.0)

## 2011-04-09 LAB — HEPATIC FUNCTION PANEL
ALT: 21 U/L (ref 0–35)
AST: 20 U/L (ref 0–37)
Albumin: 2.6 g/dL — ABNORMAL LOW (ref 3.5–5.2)
Albumin: 2.8 g/dL — ABNORMAL LOW (ref 3.5–5.2)
Albumin: 2.9 g/dL — ABNORMAL LOW (ref 3.5–5.2)
Alkaline Phosphatase: 56 U/L (ref 39–117)
Bilirubin, Direct: 0.1 mg/dL (ref 0.0–0.3)
Bilirubin, Direct: 0.6 mg/dL — ABNORMAL HIGH (ref 0.0–0.3)
Indirect Bilirubin: 0.5 mg/dL (ref 0.3–0.9)
Total Bilirubin: 0.6 mg/dL (ref 0.3–1.2)
Total Bilirubin: 1.6 mg/dL — ABNORMAL HIGH (ref 0.3–1.2)
Total Protein: 5.1 g/dL — ABNORMAL LOW (ref 6.0–8.3)
Total Protein: 5.9 g/dL — ABNORMAL LOW (ref 6.0–8.3)

## 2011-04-09 LAB — GLUCOSE, CAPILLARY
Glucose-Capillary: 100 mg/dL — ABNORMAL HIGH (ref 70–99)
Glucose-Capillary: 101 mg/dL — ABNORMAL HIGH (ref 70–99)
Glucose-Capillary: 105 mg/dL — ABNORMAL HIGH (ref 70–99)
Glucose-Capillary: 107 mg/dL — ABNORMAL HIGH (ref 70–99)
Glucose-Capillary: 108 mg/dL — ABNORMAL HIGH (ref 70–99)
Glucose-Capillary: 110 mg/dL — ABNORMAL HIGH (ref 70–99)
Glucose-Capillary: 118 mg/dL — ABNORMAL HIGH (ref 70–99)
Glucose-Capillary: 139 mg/dL — ABNORMAL HIGH (ref 70–99)
Glucose-Capillary: 140 mg/dL — ABNORMAL HIGH (ref 70–99)
Glucose-Capillary: 144 mg/dL — ABNORMAL HIGH (ref 70–99)
Glucose-Capillary: 159 mg/dL — ABNORMAL HIGH (ref 70–99)
Glucose-Capillary: 165 mg/dL — ABNORMAL HIGH (ref 70–99)
Glucose-Capillary: 170 mg/dL — ABNORMAL HIGH (ref 70–99)
Glucose-Capillary: 176 mg/dL — ABNORMAL HIGH (ref 70–99)
Glucose-Capillary: 177 mg/dL — ABNORMAL HIGH (ref 70–99)
Glucose-Capillary: 179 mg/dL — ABNORMAL HIGH (ref 70–99)
Glucose-Capillary: 183 mg/dL — ABNORMAL HIGH (ref 70–99)
Glucose-Capillary: 186 mg/dL — ABNORMAL HIGH (ref 70–99)
Glucose-Capillary: 187 mg/dL — ABNORMAL HIGH (ref 70–99)
Glucose-Capillary: 207 mg/dL — ABNORMAL HIGH (ref 70–99)
Glucose-Capillary: 216 mg/dL — ABNORMAL HIGH (ref 70–99)
Glucose-Capillary: 223 mg/dL — ABNORMAL HIGH (ref 70–99)
Glucose-Capillary: 243 mg/dL — ABNORMAL HIGH (ref 70–99)
Glucose-Capillary: 248 mg/dL — ABNORMAL HIGH (ref 70–99)
Glucose-Capillary: 256 mg/dL — ABNORMAL HIGH (ref 70–99)
Glucose-Capillary: 261 mg/dL — ABNORMAL HIGH (ref 70–99)
Glucose-Capillary: 283 mg/dL — ABNORMAL HIGH (ref 70–99)
Glucose-Capillary: 53 mg/dL — ABNORMAL LOW (ref 70–99)
Glucose-Capillary: 59 mg/dL — ABNORMAL LOW (ref 70–99)
Glucose-Capillary: 63 mg/dL — ABNORMAL LOW (ref 70–99)
Glucose-Capillary: 72 mg/dL (ref 70–99)
Glucose-Capillary: 72 mg/dL (ref 70–99)
Glucose-Capillary: 72 mg/dL (ref 70–99)
Glucose-Capillary: 77 mg/dL (ref 70–99)
Glucose-Capillary: 84 mg/dL (ref 70–99)
Glucose-Capillary: 94 mg/dL (ref 70–99)
Glucose-Capillary: 95 mg/dL (ref 70–99)

## 2011-04-09 LAB — CREATININE, SERUM
Creatinine, Ser: 0.68 mg/dL (ref 0.4–1.2)
GFR calc Af Amer: >60 mL/min (ref >60)
GFR calc non Af Amer: >60 mL/min (ref >60)

## 2011-04-09 LAB — CBC
HCT: 26.7 % — ABNORMAL LOW (ref 36.0–46.0)
HCT: 26.8 % — ABNORMAL LOW (ref 36.0–46.0)
HCT: 27.2 % — ABNORMAL LOW (ref 36.0–46.0)
HCT: 28.1 % — ABNORMAL LOW (ref 36.0–46.0)
HCT: 28.8 % — ABNORMAL LOW (ref 36.0–46.0)
HCT: 28.8 % — ABNORMAL LOW (ref 36.0–46.0)
HCT: 32.9 % — ABNORMAL LOW (ref 36.0–46.0)
HCT: 33.6 % — ABNORMAL LOW (ref 36.0–46.0)
HCT: 35.4 % — ABNORMAL LOW (ref 36.0–46.0)
HCT: 36.7 % (ref 36.0–46.0)
Hemoglobin: 10 g/dL — ABNORMAL LOW (ref 12.0–15.0)
Hemoglobin: 11.1 g/dL — ABNORMAL LOW (ref 12.0–15.0)
Hemoglobin: 11.1 g/dL — ABNORMAL LOW (ref 12.0–15.0)
Hemoglobin: 11.3 g/dL — ABNORMAL LOW (ref 12.0–15.0)
Hemoglobin: 11.5 g/dL — ABNORMAL LOW (ref 12.0–15.0)
Hemoglobin: 12.2 g/dL (ref 12.0–15.0)
Hemoglobin: 12.6 g/dL (ref 12.0–15.0)
Hemoglobin: 9.1 g/dL — ABNORMAL LOW (ref 12.0–15.0)
Hemoglobin: 9.1 g/dL — ABNORMAL LOW (ref 12.0–15.0)
Hemoglobin: 9.3 g/dL — ABNORMAL LOW (ref 12.0–15.0)
Hemoglobin: 9.6 g/dL — ABNORMAL LOW (ref 12.0–15.0)
Hemoglobin: 9.9 g/dL — ABNORMAL LOW (ref 12.0–15.0)
MCHC: 33.5 g/dL (ref 30.0–36.0)
MCHC: 33.6 g/dL (ref 30.0–36.0)
MCHC: 34.1 g/dL (ref 30.0–36.0)
MCHC: 34.1 g/dL (ref 30.0–36.0)
MCHC: 34.1 g/dL (ref 30.0–36.0)
MCHC: 34.1 g/dL (ref 30.0–36.0)
MCHC: 34.2 g/dL (ref 30.0–36.0)
MCHC: 34.3 g/dL (ref 30.0–36.0)
MCHC: 34.3 g/dL (ref 30.0–36.0)
MCHC: 34.4 g/dL (ref 30.0–36.0)
MCHC: 34.5 g/dL (ref 30.0–36.0)
MCHC: 34.7 g/dL (ref 30.0–36.0)
MCV: 81.8 fL (ref 78.0–100.0)
MCV: 81.8 fL (ref 78.0–100.0)
MCV: 86.3 fL (ref 78.0–100.0)
MCV: 86.7 fL (ref 78.0–100.0)
MCV: 87.1 fL (ref 78.0–100.0)
MCV: 87.3 fL (ref 78.0–100.0)
MCV: 87.8 fL (ref 78.0–100.0)
MCV: 87.9 fL (ref 78.0–100.0)
MCV: 88.1 fL (ref 78.0–100.0)
MCV: 88.2 fL (ref 78.0–100.0)
MCV: 88.4 fL (ref 78.0–100.0)
Platelets: 116 10*3/uL — ABNORMAL LOW (ref 150–400)
Platelets: 130 10*3/uL — ABNORMAL LOW (ref 150–400)
Platelets: 134 10*3/uL — ABNORMAL LOW (ref 150–400)
Platelets: 138 10*3/uL — ABNORMAL LOW (ref 150–400)
Platelets: 140 10*3/uL — ABNORMAL LOW (ref 150–400)
Platelets: 154 10*3/uL (ref 150–400)
Platelets: 184 10*3/uL (ref 150–400)
Platelets: 204 10*3/uL (ref 150–400)
Platelets: 232 10*3/uL (ref 150–400)
Platelets: 281 10*3/uL (ref 150–400)
Platelets: 284 10*3/uL (ref 150–400)
RBC: 3.02 MIL/uL — ABNORMAL LOW (ref 3.87–5.11)
RBC: 3.04 MIL/uL — ABNORMAL LOW (ref 3.87–5.11)
RBC: 3.08 MIL/uL — ABNORMAL LOW (ref 3.87–5.11)
RBC: 3.2 MIL/uL — ABNORMAL LOW (ref 3.87–5.11)
RBC: 3.31 MIL/uL — ABNORMAL LOW (ref 3.87–5.11)
RBC: 3.33 MIL/uL — ABNORMAL LOW (ref 3.87–5.11)
RBC: 3.73 MIL/uL — ABNORMAL LOW (ref 3.87–5.11)
RBC: 3.81 MIL/uL — ABNORMAL LOW (ref 3.87–5.11)
RBC: 3.99 MIL/uL (ref 3.87–5.11)
RBC: 4.06 MIL/uL (ref 3.87–5.11)
RBC: 4.08 MIL/uL (ref 3.87–5.11)
RDW: 12.6 % (ref 11.5–15.5)
RDW: 12.7 % (ref 11.5–15.5)
RDW: 12.7 % (ref 11.5–15.5)
RDW: 12.8 % (ref 11.5–15.5)
RDW: 12.8 % (ref 11.5–15.5)
RDW: 13.1 % (ref 11.5–15.5)
RDW: 13.1 % (ref 11.5–15.5)
RDW: 13.1 % (ref 11.5–15.5)
RDW: 13.2 % (ref 11.5–15.5)
RDW: 13.2 % (ref 11.5–15.5)
RDW: 14.3 % (ref 11.5–15.5)
RDW: 14.4 % (ref 11.5–15.5)
RDW: 14.6 % (ref 11.5–15.5)
WBC: 10.3 10*3/uL (ref 4.0–10.5)
WBC: 10.6 10*3/uL — ABNORMAL HIGH (ref 4.0–10.5)
WBC: 10.8 10*3/uL — ABNORMAL HIGH (ref 4.0–10.5)
WBC: 11.4 10*3/uL — ABNORMAL HIGH (ref 4.0–10.5)
WBC: 5.7 10*3/uL (ref 4.0–10.5)
WBC: 6.3 10*3/uL (ref 4.0–10.5)
WBC: 6.5 10*3/uL (ref 4.0–10.5)
WBC: 6.8 10*3/uL (ref 4.0–10.5)
WBC: 7.3 10*3/uL (ref 4.0–10.5)
WBC: 7.7 10*3/uL (ref 4.0–10.5)
WBC: 9.7 10*3/uL (ref 4.0–10.5)

## 2011-04-09 LAB — PLATELET COUNT: Platelets: 188 10*3/uL (ref 150–400)

## 2011-04-09 LAB — CK TOTAL AND CKMB (NOT AT ARMC)
CK, MB: 16.4 ng/mL — ABNORMAL HIGH (ref 0.3–4.0)
Relative Index: 2.4 (ref 0.0–2.5)
Relative Index: INVALID (ref 0.0–2.5)
Total CK: 670 U/L — ABNORMAL HIGH (ref 7–177)
Total CK: 90 U/L (ref 7–177)

## 2011-04-09 LAB — COMPREHENSIVE METABOLIC PANEL
ALT: 10 U/L (ref 0–35)
AST: 12 U/L (ref 0–37)
Albumin: 2.7 g/dL — ABNORMAL LOW (ref 3.5–5.2)
Albumin: 3.1 g/dL — ABNORMAL LOW (ref 3.5–5.2)
Alkaline Phosphatase: 59 U/L (ref 39–117)
Alkaline Phosphatase: 75 U/L (ref 39–117)
BUN: 7 mg/dL (ref 6–23)
BUN: 8 mg/dL (ref 6–23)
CO2: 26 mEq/L (ref 19–32)
Calcium: 8.1 mg/dL — ABNORMAL LOW (ref 8.4–10.5)
Calcium: 8.6 mg/dL (ref 8.4–10.5)
Chloride: 106 mEq/L (ref 96–112)
Chloride: 107 mEq/L (ref 96–112)
Creatinine, Ser: 0.74 mg/dL (ref 0.4–1.2)
Creatinine, Ser: 0.9 mg/dL (ref 0.4–1.2)
GFR calc Af Amer: >60 mL/min (ref >60)
GFR calc Af Amer: >60 mL/min (ref >60)
GFR calc non Af Amer: >60 mL/min (ref >60)
Glucose, Bld: 136 mg/dL — ABNORMAL HIGH (ref 70–99)
Potassium: 3.6 mEq/L (ref 3.5–5.1)
Potassium: 3.8 mEq/L (ref 3.5–5.1)
Sodium: 136 mEq/L (ref 135–145)
Total Bilirubin: 0.5 mg/dL (ref 0.3–1.2)
Total Protein: 5.5 g/dL — ABNORMAL LOW (ref 6.0–8.3)
Total Protein: 6.6 g/dL (ref 6.0–8.3)

## 2011-04-09 LAB — BLOOD GAS, ARTERIAL
Acid-base deficit: 1.5 mmol/L (ref 0.0–2.0)
Bicarbonate: 22.7 mEq/L (ref 20.0–24.0)
Drawn by: 24486
FIO2: 0.21 %
O2 Saturation: 98 %
Patient temperature: 98.6
TCO2: 23.8 mmol/L (ref 0–100)
pCO2 arterial: 37.5 mmHg (ref 35.0–45.0)
pH, Arterial: 7.398 (ref 7.350–7.400)
pO2, Arterial: 98.3 mmHg (ref 80.0–100.0)

## 2011-04-09 LAB — LIPID PANEL
Cholesterol: 103 mg/dL (ref 0–200)
HDL: 37 mg/dL — ABNORMAL LOW (ref >39)
LDL Cholesterol: 57 mg/dL (ref 0–99)
Total CHOL/HDL Ratio: 2.8 RATIO

## 2011-04-09 LAB — MAGNESIUM
Magnesium: 2.1 mg/dL (ref 1.5–2.5)
Magnesium: 2.3 mg/dL (ref 1.5–2.5)
Magnesium: 2.5 mg/dL (ref 1.5–2.5)

## 2011-04-09 LAB — HEMOGLOBIN A1C
Hgb A1c MFr Bld: 6.7 % — ABNORMAL HIGH (ref 4.6–6.1)
Mean Plasma Glucose: 146 mg/dL

## 2011-04-09 LAB — URINALYSIS, MICROSCOPIC ONLY
Glucose, UA: NEGATIVE mg/dL
Hgb urine dipstick: NEGATIVE
Leukocytes, UA: NEGATIVE
Protein, ur: NEGATIVE mg/dL
Specific Gravity, Urine: 1.01 (ref 1.005–1.030)
Urobilinogen, UA: 0.2 mg/dL (ref 0.0–1.0)

## 2011-04-09 LAB — PROTIME-INR
INR: 1.4 (ref 0.00–1.49)
Prothrombin Time: 17.6 seconds — ABNORMAL HIGH (ref 11.6–15.2)

## 2011-04-09 LAB — POCT I-STAT 4, (NA,K, GLUC, HGB,HCT)
Glucose, Bld: 137 mg/dL — ABNORMAL HIGH (ref 70–99)
Glucose, Bld: 143 mg/dL — ABNORMAL HIGH (ref 70–99)
Glucose, Bld: 189 mg/dL — ABNORMAL HIGH (ref 70–99)
HCT: 20 % — ABNORMAL LOW (ref 36.0–46.0)
HCT: 24 % — ABNORMAL LOW (ref 36.0–46.0)
Hemoglobin: 10.2 g/dL — ABNORMAL LOW (ref 12.0–15.0)
Hemoglobin: 16 g/dL — ABNORMAL HIGH (ref 12.0–15.0)
Hemoglobin: 6.8 g/dL — CL (ref 12.0–15.0)
Hemoglobin: 8.2 g/dL — ABNORMAL LOW (ref 12.0–15.0)
Potassium: 3.5 mEq/L (ref 3.5–5.1)
Potassium: 3.8 mEq/L (ref 3.5–5.1)
Potassium: 3.9 mEq/L (ref 3.5–5.1)
Potassium: 4.1 mEq/L (ref 3.5–5.1)
Sodium: 136 mEq/L (ref 135–145)
Sodium: 137 mEq/L (ref 135–145)
Sodium: 138 mEq/L (ref 135–145)

## 2011-04-09 LAB — HEMOGLOBIN AND HEMATOCRIT, BLOOD: HCT: 21.5 % — ABNORMAL LOW (ref 36.0–46.0)

## 2011-04-09 LAB — POCT I-STAT 3, VENOUS BLOOD GAS (G3P V)
Bicarbonate: 21.5 mEq/L (ref 20.0–24.0)
O2 Saturation: 78 %
TCO2: 23 mmol/L (ref 0–100)
pH, Ven: 7.396 — ABNORMAL HIGH (ref 7.250–7.300)

## 2011-04-09 LAB — APTT
aPTT: 32 seconds (ref 24–37)
aPTT: 34 seconds (ref 24–37)

## 2011-04-09 LAB — CARDIAC PANEL(CRET KIN+CKTOT+MB+TROPI)
Relative Index: INVALID (ref 0.0–2.5)
Troponin I: 0.01 ng/mL (ref 0.00–0.06)

## 2011-04-09 LAB — POCT I-STAT GLUCOSE
Glucose, Bld: 166 mg/dL — ABNORMAL HIGH (ref 70–99)
Operator id: 190281
Operator id: 190281

## 2011-04-09 LAB — AMYLASE: Amylase: 213 U/L — ABNORMAL HIGH (ref 27–131)

## 2011-04-09 LAB — TROPONIN I: Troponin I: 0.01 ng/mL (ref 0.00–0.06)

## 2011-04-09 LAB — PREGNANCY, URINE: Preg Test, Ur: NEGATIVE

## 2011-04-11 LAB — PLATELET INHIBITION P2Y12
P2Y12 % Inhibition: 52 %
Platelet Function  P2Y12: 141 [PRU] — ABNORMAL LOW (ref 194–418)
Platelet Function Baseline: 296 [PRU] (ref 194–418)

## 2011-05-15 NOTE — Cardiovascular Report (Signed)
[name], [name]:  [id number]   MEDICAL RECORD NO.:  [id number]          PATIENT TYPE:  OIB   LOCATION:  1961                         FACILITY:  [facility]   PHYSICIAN:  Veverly Fells. Excell Seltzer, MD  DATE OF BIRTH:  03/27/1966   DATE OF PROCEDURE:  05/02/2009  DATE OF DISCHARGE:  05/02/2009                            CARDIAC CATHETERIZATION   PROCEDURES:  1. Left heart catheterization.  2. Selective coronary angiography.  3. Left ventricular angiography.  4. Left subclavian angiography.   PROCEDURAL INDICATIONS:  Ms. Verrill is a very nice, 45 year old diabetic  woman who presented with an acute inferior wall myocardial infarction  approximately 2 months ago.  She was found to have severe multivessel  CAD at that time.  She had a totally occluded LAD with the distal vessel  poorly visualized.  Her right coronary artery was treated with drug-  eluting stents.  She has been seen by Dr. Jaynie Collins for surgical  evaluation.  After careful review of her cath films, I elected to bring  her in for coronary angiography to better assess her left coronary  system and the suitability of her LAD for revascularization.  The  patient has had intermittent chest pain over the last several weeks with  both typical and atypical features of angina.   Risks and indications of procedure were reviewed with the patient.  Informed consent was obtained.  The right groin was prepped, draped, and  anesthetized 1% lidocaine.  Using modified Seldinger technique, a 5-  French sheath was placed in the right femoral artery.  Standard 5-French  Judkins catheters were used for coronary angiography and left  ventriculography.  A pullback across the aortic valve was performed.  The patient tolerated the procedure well.  Manual pressure was used for  hemostasis.   FINDINGS:   HEMODYNAMICS:  Aortic pressure 107/63, with a mean of 83 and left  ventricular pressure 114/18.   CORONARY ANGIOGRAPHY:   Left main coronary artery is widely patent.   LAD:  The LAD is patent with mild plaque proximally, there is no  significant obstructive disease prior to the first diagonal branch.  The  first diagonal branch is small caliber and is severely diseased in the  proximal vessel.  The proximal diagonal branch has an 80% long segment  stenosis.  The LAD is occluded subtotally occluded at the origin of the  diagonal branch.  There is 99% stenosis in the remaining portions of the  LAD, fill late via antegrade flow.  The LAD is collateralized from the  right coronary artery and it is well visualized via a robust septal  perforating cascade.  The LAD appears to be a small caliber vessel that  reaches the LV apex via filling from right-to-left collaterals.   Left circumflex.  There is a large intermediate branch that is widely  patent with no significant stenosis.  The vessel bifurcates and supplies  2 main branches to the lateral wall.  There is no significant stenosis  involving the intermediate branch.   The left circumflex proper is a small vessel.  It  courses down the AV  groove and supplies a small OM branch.  There is no significant stenosis  noted.   Right coronary artery:  The right coronary artery is patent.  There is a  long stented segment through the midportion.  There is an area of mild  restenosis in the midportion of the stented segment.  The stenosis is in  the range of 30-40%.  The vessel courses down and supplies a PDA branch.  The distal RCA is diffusely diseased.  There is a large acute marginal  branch present as well.  The PDA supplies robust collaterals to the LAD  territory.   Left ventriculography:  Left ventricular function is normal.  The LVEF  is estimated at 60%.  There is no significant mitral regurgitation.  Specifically, there are no wall motion abnormalities visualized in  either the inferior wall or the anterolateral wall.   Left subclavian angiography shows  a patent left subclavian, there is a  large for tubal branch present.  The left internal mammary is a large  artery suitable for a vascular conduit.   IMPRESSION:  1. Patent right coronary artery stent with mild restenosis following      acute inferior wall myocardial infarction.  2. Total with total occlusion of the left anterior descending with      right-to-left collaterals.  3. Patent left circumflex.  4. Preserved left ventricular function.  5. Patent left subclavian artery and left internal mammary artery.   RECOMMENDATIONS:  We will review the patient's films with Dr. Tyrone Sage  and make a final recommendation regarding revascularization.  The  patient's LAD is much better visualized on this study.  While it is a  small vessel, it clearly supplies viable myocardium in the setting of  normal left ventricular function.      Veverly Fells. Excell Seltzer, MD  Electronically Signed     MDC/MEDQ  D:  05/02/2009  T:  05/02/2009  Job:  [id number]   cc:   Sheliah Plane, MD

## 2011-05-15 NOTE — Assessment & Plan Note (Signed)
OFFICE VISIT   [name], [name]  DOB:  May 11, 1966                                        April 20, 2009  CHART #:  [id number]   The patient returns to the office today in followup.  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  Dr. Chales Abrahams placed a drug-eluting stent in the right coronary  artery and then she was referred for consideration of bypass surgery,  but LAD appeared chronically occluded, though, we did not see a large  LAD vessel.  When the patient was first seen, since she was only a week  after placement of a new drug-eluting stent and on Plavix, rather than  proceeding immediately to stopping her Plavix and surgery, we obtained a  platelet aggregation studies, which showed her to be a responder to  Plavix and follow up this now.  Because of insurance reasons she is  transferred her care to Dr. Excell Seltzer at [facility] who she has not  seen yet.  Since I last saw her, she noted several changes, one she has  had some hair loss, she still has some fatigue, her liver enzymes went  from worse elevated and her Lipitor was stopped by her medical doctor.  She also attributed episodes of nausea, vomiting, and diarrhea to her  Lipitor.  She notes that she is walking up to 2 miles a day without  angina.  Though, did take 2 nitroglycerin tablets several nights ago  after lying down to go to bed, though, during the day and since she has  had no recurrent angina.   PHYSICAL EXAMINATION:  VITAL SIGNS:  Her blood pressure 113/79, pulse  77, respiratory rate is 18, and O2 sat is 99%.  LUNGS:  Clear bilaterally.  CARDIAC:  Regular rate and rhythm.  She has no pedal edema or  tenderness.   I again reviewed the films and also with Dr. Donata Clay and at this point  see no compelling reason to proceed urgently to bypass her LAD which  is  barely visible on the films especially in light of a newly placed stent.  I have again discussed this with the patient.  She is to see Dr. Excell Seltzer  for further opinion next week and I have asked her to have him call me  after he has seen her and we will formulate a longer term plan.  She  also has noted some menstrual irregularity and I have cautioned her  again about going back on birth control pills.  Her original infarct  occurred about 2 months after starting birth control pills for menstrual  irregularity.  At this point, I have not made her specific followup  appointment, but we will wait and discuss the case with Dr. Excell Seltzer after  he sees her next week.   Sheliah Plane, MD  Electronically Signed   EG/MEDQ  D:  04/20/2009  T:  04/21/2009  Job:  [id number]   cc:   Veverly Fells. Excell Seltzer, MD

## 2011-05-15 NOTE — Op Note (Signed)
[name], [name]:  [id number]   MEDICAL RECORD NO.:  [id number]          PATIENT TYPE:  INP   LOCATION:  2312                         FACILITY:  [facility]   PHYSICIAN:  Sheliah Plane, MD    DATE OF BIRTH:  12-25-1966   DATE OF PROCEDURE:  06/07/2009  DATE OF DISCHARGE:                               OPERATIVE REPORT   PREOPERATIVE DIAGNOSES:  1. Recurrent angina.  2. History of drug-coated stent, right coronary artery.   POSTOPERATIVE DIAGNOSES:  1. Recurrent angina.  2. History of drug-coated stent, right coronary artery.   SURGICAL PROCEDURE:  Coronary artery bypass grafting x3 with left  internal mammary to the left anterior descending coronary artery,  reverse saphenous vein graft to the first diagonal coronary artery and  reverse saphenous vein graft to the distal right coronary artery with  right thigh endovein harvesting.   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  At that time, she was also  found to have total occlusion of LAD, which was very faintly visible and  some disease in the small diagonal.  The circumflex branches were free  of disease.  A consideration for whether to proceed with bypass surgery  early or late was discussed.  Subsequent to the stent placement, the  patient underwent stress test and had evidence of anterior ischemia.  Repeat cardiac catheterization showed some restenosis of the right  coronary artery just proximal to the stent, but in the 40% range.  The  LAD was much more evident on the second catheterization filling from  collaterals of the right.  Overall ventricular  function was preserved.  Because of the patient's continued atypical chest pain and positive  stress test, coronary artery bypass grafting was recommended primarily  to place a mammary to the LAD, but also with concern of possible acute  thrombosis of the coronary artery with the recently placed drug-coated  stent and with some proximal narrowing.  The risks  and options were  discussed with the patient in detail and she agreed and signed informed  consent.   DESCRIPTION OF PROCEDURE:  With Swan-Ganz and arterial line monitors in  place, the patient underwent general endotracheal anesthesia without  incidence.  Skin of chest and leg was prepped with Betadine and draped  in the usual sterile manner.  Using the Guidant endovein harvesting  system, segment of vein was harvested from the right thigh and was of  good quality and caliber.  Median sternotomy was performed.  Left  internal mammary artery was dissected down as pedicle graft.  The distal  arteries were divided and had good free flow.  Pericardium was opened.  The patient overall had preserved LV function and all of her vessels  were relatively small and diffusely diseased.  She was systemically  heparinized.  The ascending aorta was cannulated.  The right atrium was  cannulated and aortic root vent cardioplegia needle was introduced into  the ascending aorta.  The patient was placed on cardiopulmonary bypass  2.4 L per minute per meter square.  Sites of anastomosis were selected  and dissected at the epicardium.  The patient's body temperature was  cooled to 30 degrees.  Aortic crossclamp was applied and 500 mL of cold  blood potassium cardioplegia was administered antegrade.  The distal  right coronary artery passed the stent placement and passed the takeoff  of a very proximal origin of the posterior descending coronary artery  and the vessel was opened using a running 7-0 Prolene, the distal  anastomosis was performed.  The  attention was then turned to the first  diagonal, coronary artery which was opened and admitted a 1-mm probe.  The vessel was small and diffusely diseased.  Using a running 7-0  Prolene, distal anastomosis was performed.  Attention was then turned to  the very distal third of the left anterior descending coronary artery,  the proximal two-thirds of the vessel were intramyocardial.  The vessel  was opened and admitted a 1-mm probe distally and proximally using a  running 8-0 Prolene left internal mammary artery was anastomosed to the  left anterior descending coronary artery.  With release of the bulldog  on the mammary artery, there was rise of myocardial septal temperature.  The bulldog was placed back on the mammary artery.  With the crossclamps  still in place, 3 punch aortotomies were performed and each of the 2  vein grafts were anastomosed to the ascending aorta.  The heart was  deaired through the proximal anastomosis.  The aortic crossclamp was  removed with total crossclamp time of 78 minutes.  Sites of anastomosis  were inspected and free of bleeding.  The patient was then ventilated  and weaned from cardiopulmonary bypass without difficulty.  She was  decannulated in usual fashion.  Protamine sulfate was administered with  operative field hemostatic.  A left pleural tube and a Blake mediastinal  drain were left in place.  Pericardium was reapproximated.  Sternal  graft marks were plugged.  Sternum was closed with #6 stainless steel  wire.  Fascia closed with interrupted 0 Vicryl running, 3-0 Vicryl  subcutaneous tissue, 4-0 subcuticular stitch in the skin edges.  Dry  dressings were applied.  Sponge and needle count was reported as correct  at the completion of procedure.  The patient tolerated the procedure  without obvious complication and was transferred to the surgical  intensive care unit for further postoperative care.      Sheliah Plane, MD  Electronically  Signed     EG/MEDQ  D:  06/07/2009  T:  06/08/2009  Job:  [id number]   cc:   Veverly Fells. Excell Seltzer, MD  Constance Haw, MD

## 2011-05-15 NOTE — Discharge Summary (Signed)
[name], TORTI NO.:  [id number]   MEDICAL RECORD NO.:  [id number]          PATIENT TYPE:  INP   LOCATION:  2001                         FACILITY:  [facility]   PHYSICIAN:  Sheliah Plane, MD    DATE OF BIRTH:  1966-11-13   DATE OF ADMISSION:  06/03/2009  DATE OF DISCHARGE:  06/12/2009                               DISCHARGE SUMMARY   ADMITTING DIAGNOSES:  1. History of acute myocardial infarction.  2. History of coronary artery disease (percutaneous transluminal      coronary angioplasty with stent to the  March 2010).  3. History of hyperlipidemia.  4. History of diabetes (on insulin pump).   DISCHARGE DIAGNOSES:  1. History of acute myocardial infarction.  2. History of coronary artery disease (percutaneous transluminal      coronary angioplasty with stent to the  March 2010).  3. History of hyperlipidemia.  4. History of diabetes (on insulin pump).  5. Atrial fibrillation with rapid ventricular response (converted to      sinus rhythm after amiodarone).   PROCEDURE:  Coronary artery bypass graft x3 (left internal mammary  artery graft to left anterior descending, saphenous vein graft to first  diagonal, saphenous vein graft to distal right coronary artery with  endoscopic vein harvesting of the right thigh by Dr. Tyrone Sage on June 07, 2009.   HISTORY OF PRESENT ILLNESS:  This is a 45 year old Caucasian female with  past medical history of acute MI (status post PTCA with drug-eluting  stent to the RCA), history of diabetes mellitus (diagnosed at age 37 on  insulin pump and hyperlipidemia who was originally seen in the office by  Dr. Tyrone Sage) on March 31, 2009, for coronary artery disease.  It should  be noted that the patient was placed on Plavix after having had the  stent placed.  She remained medically stable and it was decided after  reviewing the cardiac catheterization films to wait a couple more weeks  before considering any further surgical  intervention.  She was again  seen by Dr. Tyrone Sage on April 20, 2009.  At that time, she had  complaints of hair loss, nauseousness, emesis, and diarrhea.  She  attributed the last three symptoms to her Lipitor.  This was stopped by  her medical doctor; however, her liver enzymes still remain elevated.  She had no recurrent angina.  She underwent a cardiac catheterization on  May 02, 2009, by Dr. Excell Seltzer and was found to have a mild restenosis of  the right coronary artery (although patent), total occlusion of the LAD  with right to left collaterals and the preserved EF of 60%.  The patient  was admitted to [facility] on June 03, 2009, to be placed on Integrilin  and Plavix was stopped.  Duplex carotid ultrasound showed no significant  internal carotid artery stenoses.  The patient then underwent the  aforementioned CABG x3 on June 07, 2009.   BRIEF HOSPITAL COURSE STAY:  The patient was extubated without  difficulty late the afternoon of surgery.  She remained afebrile and  hemodynamically stable.  A-line  Swan and chest tubes were earlier in the  postoperative course.  The patient did develop some nauseousness on  postop day #2 and  abdominal KUB was done, which showed findings  consistent with an ileus.  No free air.  The patient also developed  atrial fibrillation with RVR.  She was placed on an amiodarone drip.  She converted into normal sinus rhythm and was placed on p.o.  amiodarone.  Episodes of nauseousness did improve.  No emesis and no  abdominal pain.  The patient was to continue progressive cardiac rehab.  She was transferred from intensive care unit to PCT for further  convalescence.  The patient continue to improve such that currently on  postop day #4 she does have occasional episodes of nauseousness, but  less so than previously, no emesis, and she is not sleeping well.  She  does take Lunesta at home and will be placed back on this p.r.n. sleep.  She is tolerating a diet and  has had a bowel movement.  She is afebrile,  heart rate is 70s to 80s, BP 104/81, O2 sat 96-99% on room air.  Preoperative weight 71 kg, today's weight questionably 86.3 kg.  ABGs  89, 77, 51 respectively.   PHYSICAL EXAMINATION:  CARDIOVASCULAR:  Regular rate and rhythm.  PULMONARY:  Clear to auscultation bilaterally.  No rales, wheezes, or  rhonchi.  ABDOMEN:  Soft, nontender, and occasional bowel sounds.  EXTREMITIES:  Mild lower extremity edema.  Sternal lower extremity  wounds are clean and dry and continuing to heal.  Provided the patient  remains afebrile and hemodynamically stable, she will be discharged on  June 12, 2009.   LABORATORY DATA:  Latest discharge labs include the following:  BMET  done on June 11, 2009, potassium 3.4 which has been supplemented, sodium  133, BUN and creatinine 7 and 0.59 respectively.  CBC done today H and H  9.1 and 26.7, white count 1600, platelet count 184,000.  Last chest x-  ray done on June 10, 2009, showed stable cardiomegaly and bibasilar  atelectasis.   DISCHARGE INSTRUCTIONS:  The patient is to remain on a low-fat, low-salt  carbohydrate modified medium caloric diet.  She is not to drive or lift  more than 10 pounds.  She is to continue with the breathing exercise.  She is to walk every day and increase her routine duration as tolerates.  She may shower.  She is to cleanse her wounds with mild soap and water.  She is to call the office if any wound problems arise.   FOLLOWUPS AND APPOINTMENTS:  1. The patient is to contact Dr. Earmon Phoenix office for followup      appointment in 2 weeks.  2. The patient will be contacted by Dr. Dennie Maizes office.  She has an      appointment to see him in 3 weeks and prior to this office      appointment, a chest x-ray will be obtained.   DISCHARGE MEDICATIONS:  1. Crestor 5 mg p.o. at bedtime.  2. Plavix 75 mg p.o. daily.  3. Enteric-coated aspirin 81 mg p.o. daily.  4. Flexeril 10 mg p.o. at bedtime  p.r.n.  5. NovoLog insulin pump subcu.  6. Lopressor 25 p.o. two times daily.  7. Lasix 40 mg p.o. daily.  8. KCl 20 mEq p.o. daily, number of days will be determined at the      time of discharge.  9. Amiodarone 400 mg p.o. x7 days, then 400  mg p.o. daily.  10.Ultram 50 mg 1 to 2 hours every hours as needed for pain.      Doree Fudge, [place]      Sheliah Plane, MD  Electronically Signed    DZ/MEDQ  D:  06/11/2009  T:  06/12/2009  Job:  [id number]   cc:   Veverly Fells. Excell Seltzer, MD  Zan Chales Abrahams

## 2011-05-15 NOTE — Consult Note (Signed)
NEW PATIENT CONSULTATION   [name], [name]  DOB:  [birth year]                                        March 31, 2009  CHART #:  [id number]   REQUESTING PHYSICIAN:  Dr. Constance Haw.   FOLLOWUP CARDIOLOGIST:  Dr. Chales Abrahams.   PRIMARY CARE PHYSICIAN:  Ocie Cornfield.   REASON FOR CONSULTATION:  Consideration for coronary artery bypass  grafting x1.   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  She was taken urgently to the cath lab in [facility] by Dr. Chales Abrahams and  was found to have a chronically occluded LAD and acutely occluded right  coronary artery.  The vessel was reopened and a 2.5 x 28 mm XIENCE drug-  eluting stent was placed.  The patient was started on Plavix.  She had  some mild left ventricular dysfunction and chronically occluded LAD with  collaterals from the right.  She is now 3 weeks following stent  placement and complains of fatigue, but no definite chest pain and  gradually improving, but has not returned to work.  The consideration  now is whether to proceed with coronary artery bypass grafting with the  mammary to her LAD now or later or not at all.   PAST MEDICAL HISTORY:  The patient has no hypertension.  Does have  hyperlipidemia, longstanding diabetes since age 5, currently treated  with the insulin pump.  She had previous cholecystectomy 2-3 years ago,  vitrectomy on both eyes.  History of rheumatoid arthritis followed by  Dr. Corliss Skains in [place].   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   MEDICATIONS:  1. Humira 40 mg injection weekly.  2. Avandia 20 mg a day.  3. Flexeril 10 mg.  4. Ramipril 2.5 mg a day.  5.  Plavix 75 mg a day.  6. Aspirin 81 mg a day.  7. Metoprolol 50 mg b.i.d.  8. Lipitor 80 mg a day.  9. Nitroglycerin p.r.n. which he has not needed to take.  10.NovoLog insulin pump.  11.Currently not on birth control pills, but had been on birth control      pills many years ago and then restarted 2 months before her      myocardial infarction.   ALLERGIES:  Compazine, which causes tardive dyskinesia and shellfish  primarily shrimp causes swelling.   REVIEW OF SYSTEMS:  Cardiac review of systems is negative for chest  pain.  She does have exertional shortness of breath.  Denies resting  shortness of breath.  Denies palpitations, lower extremity edema,  syncope, presyncope, or orthopnea.  General review of systems, she does  have fatigue.  Denies nausea, fever, chills, or night sweats.  Denies  recent change in vision.  Denies chest pain or palpitations noted above.  Denies hemoptysis.  Denies abdominal pain.  Has had no hematuria or  urinary frequency.  Denies any joint swelling or tenderness.  Denies  psychiatric history.   PHYSICAL EXAMINATION:  VITAL SIGNS:  The patient's blood pressure  108/77, pulse is 88, respiratory rate 16, and O2 sats 97%.  She is 5  feet 3 inches tall, 153 pounds.  NECK:  No carotid bruits.  LUNGS:  Clear bilaterally.  CARDIAC:  Regular rate and rhythm without murmur or gallop.  ABDOMEN:  Her insulin pump to be situated currently in the right lower  quadrant.  She has small incision from previous laparoscopic  cholecystectomy.  EXTREMITIES:  She has 1+ DP and PT pulses bilaterally.  Appears to have  adequate vein for bypass in the lower extremities.   Recent laboratory findings done on the 10th show a potassium 3.7 and  creatinine of 0.68.  Cholesterol 165, triglycerides 110, HDL 37, and LDL  106.   Cardiac catheterization films are reviewed and show a successful  angioplasty with opening of a small, but fairly extensive right coronary  system that  is now opened.  Circumflex without significant disease with  several obtuse marginal branches.  There is faint filling of a LAD that  is chronically occluded.  It is difficult to judge the exact size of  this vessel.  Overall, ventricular function is preserved with some mild  inferior hypokinesis.   IMPRESSION:  The patient with longstanding diabetes, presented with  acute right coronary occlusion 2 months after resuming birth control  pills with successfully placed drug-eluting stent 3 weeks ago with a  chronic occluded and collateralized left anterior descending.  At this  point the issues are extensive left anterior descending system to  warrant bypass with or without symptoms, and if so when is the optimal  time to do this, whether to wait longer rather than jeopardize the drug-  coated stent and being off Plavix even with readmission and week of  Integrilin and Plavix washout.  At this point, the patient is improving  from her acute situation 3 weeks ago, inclined to wait longer before  proceeding with any further intervention.  We will obtain a newly  started ADP-P2Y12 platelet inhibition assay to confirm platelet  aggregation inhibition by the Plavix.  Also, we will review the cath  films further and discuss with colleagues the various options as it is  not totally clear that urgent coronary artery bypass grafting with a  mammary to the left anterior descending will have a great benefit  currently.  I will plan to see her back in 3 weeks.   Sheliah Plane, MD  Electronically Signed   EG/MEDQ  D:  03/31/2009  T:  04/01/2009  Job:  [id number]   cc:   Ocie Cornfield  DR Constance Haw  Dr. Corliss Skains

## 2011-05-15 NOTE — H&P (Signed)
[name], [name]:  [id number]   MEDICAL RECORD NO.:  [id number]          PATIENT TYPE:  INP   LOCATION:  2013                         FACILITY:  [facility]   PHYSICIAN:  Hollice Espy, M.D.DATE OF BIRTH:  Feb 01, 1966   DATE OF ADMISSION:  07/04/2009  DATE OF DISCHARGE:                              HISTORY & PHYSICAL   PRIMARY CARE PHYSICIAN:  Seymour Bars, D.O.   PRIMARY CARDIOLOGIST:  Veverly Fells. Excell Seltzer, M.D.   CHIEF COMPLAINT:  Chest pain, nausea and vomiting x1 day.   HISTORY OF PRESENT ILLNESS:  The patient is a pleasant 45 year old  Caucasian female with a significant past medical history for obstructive  coronary artery disease, which required a 3-vessel coronary artery  bypass grafting on June 07, 2009.  The patient also had a history of a  drug-alluding stent placed in her right coronary artery.  The patient  was discharged from the hospital after the bypass surgery on June 12, 2009.  The patient was reportedly doing well until yesterday when she  developed a chest pain.  The pain is located both on the left and right  sides of the chest.  The pain is being described as a pressure-like  sensation.  She denies having any radiation.  The pain is similar to the  one when she had her myocardial infarction but the pain is less severe  in intensity.  The pain is not pleuritic.  The patient however, reports  pain at the suture site from her CABG when she takes deep breaths.  There are no specific aggravating factors for the pain.  The pain was  relieved with two sublingual nitroglycerin that the patient took at  home.  At the time she describes no alleviating factors.  The pain is  not associated with palpitations or light-headedness.  She has a mild  nonproductive cough.  She denies having any shortness of breath.  She  has been exercising about 15 minutes on a bike and walks outside.  She  reports no decrease in exercise tolerance.  She however,  reports  orthopnea.  She currently uses four pillows at nighttime.  She denies  having any paroxysmal nocturnal dyspnea.  The pain lasted for about 30  minutes in duration and was relieved by nitroglycerin.   The patient also complains of nausea and vomiting.  She had three  episodes of vomiting.  The vomitus was not blood-stained.  Currently she  is not having any vomiting but still is nauseated.  She does not have  any relation of the emesis with meals.  The patient reports pain in the  abdomen after she threw up three times.  The pain is located in the  epigastric region, is nonradiating.   REVIEW OF SYSTEMS:  GENERAL:  No weight loss or fever or chills,  HEAD:  No recent trauma.  Reports headache.  EARS:  No hearing loss or  discharge.  Tinnitus is present.  NOSE:  No epistaxis.  THROAT:  No sore  throat or hoarseness of voice.  CARDIOVASCULAR:  Chest pain is present.  No dyspnea on exertion.  No PND.  Positive orthopnea.  LUNGS:  Nonproductive cough is present.  GASTROINTESTINAL:  Nausea is present.  Vomiting yesterday.  Mild abdominal pain.  No diarrhea or constipation.  GENITOURINARY:  No dysuria or hematuria.  NEUROLOGIC:  No seizures.  No  muscle weakness or sensory deficits.  PSYCHIATRIC:  Mood and affect are  appropriate.  No suicidal or homicidal ideations.  SKIN:  No rashes.   PAST MEDICAL HISTORY:  1. Obstructive coronary disease requiring a drug-alluding stent to the      artery and a PSO/CABG.  2. Rheumatoid arthritis.  3. Paroxysmal atrial fibrillation.  4. Diabetes mellitus, insulin dependent.  5. Diabetic retinopathy, status post panretinal photocoagulation.  6. Hyperlipidemia.  7. Gallbladder polyp dyskinesia, status post laparoscopic      cholecystectomy on January 22, 2005.  8. Right shoulder impingement syndrome with a 1 cm type 2 subacromial      spur, status post arthroscopic repair.  9. Intravitreal hemorrhage with proliferated diabetic retinopathy, the       left eye, status post pars plana vitrectomy, supplemental      panretinal photocoagulation.  10.Diagnostic arthroscopy of the left knee on July 28, 2002.  11.Chronic stenosing tenosynovitis of the right thumb and right index      finger, status post A-1 pulley.   ALLERGIES:  COMPAZINE.   CURRENT MEDICATIONS:  1. Amiodarone 200 mg one tablet p.o. b.i.d.  2. Aspirin 81 mg one tablet p.o. daily.  3. Flexeril 10 mg one tablet p.o. at bedtime.  4. Crestor 5 mg one tablet once a day.  5. Metoprolol 25 mg p.o. b.i.d.  6. Plavix 75 mg once a day.  7. Folic acid 1 mg once a day.  8. Ramipril 2.5 mg once a day.  9. Arava 20 mg once a day.  10.Humalog pump.  11.Humira every other week.   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  There is no  history of tobacco, alcohol, or illicit drugs.   FAMILY HISTORY:  Noncontributory.   PHYSICAL EXAMINATION:  VITAL SIGNS:  Temperature 98.9 degrees  Fahrenheit, blood pressure 114/65, pulse rate 72, respirations 18.  O2  saturations 98% on room air.  GENERAL APPEARANCE:  Not in acute distress.  Alert and oriented x3.  HEENT:  Normocephalic, atraumatic.  Right pupil 5 mm, left pupil 3 mm.  Right pupil not reactive to light.  Left pupil very sluggish reaction.  NECK:  No JVD, lymphadenopathy, or carotid bruit.  CARDIOVASCULAR:  Regular rhythm.  Distant heart sounds.  No murmurs,  rubs, or gallops.  LUNGS:  Bilateral basilar crackles.  No wheezing.  ABDOMEN:  Soft, nontender, nondistended.  No hepatosplenomegaly.  Mild  epigastric pain.  No rebound or guarding.  EXTREMITIES:  No clubbing or cyanosis.  There is 1+ edema bilaterally.  NEUROLOGIC:  Grossly nonfocal.   LABORATORY DATA:  1. Chest x-ray, one view dated July 04, 2009:  There is an increase in      the opacity of the left lung base most consistent with pneumonia.  2. CBC with differential:  WBC  5800, hemoglobin 10.5, hematocrit 31.9,      platelets 281,000, neutrophils 58%, lymphocytes 17%, monocytes 5%,      eosinophils 20%.  3. BNP 301.  4. Lipase 12.  5. BMP:  Sodium 136, potassium 3.8, chloride 101, bicarb 26, BUN  6,      creatinine 0.8, blood glucose 91.  Serum calcium 8.6.  6. Total protein 6.6, albumin 3.1, ALT 10, AST 16.  7. CK-MB less than 1.  Troponin less than 0.05.  CK 43.2.  8. UA:  There were no nitrites or leukocyte esterase.   ASSESSMENT/PLAN:  1. Chest pain in a 45 year old female with a significant past medical      history of obstructive coronary artery disease with a stent to the      RCA and 3-vessel bypass surgery.  Symptoms are typical and similar      to the prior episode.  Will admit the patient to the cardiac floor      with telemetry given her stable vitals and negative cardiac enzymes      and no acute changes on the EKG.  Will start the patient on ACS      protocol.  Will cycle cardiac enzymes.  Cardiology was already      consulted by the ER physician.  Cardiologist recommended that we      admit the patient.  Will follow with recommendations.  The patient      might benefit from a left heart catheterization.  For this we made      the patient n.p.o. after midnight.  Other diagnoses in the      differential include:  2. Pericarditis versus pneumonia versus pulmonary embolism.  The chest      x-ray was concerning for pneumonia.  The patient was given one dose      of ceftriaxone.  We will switch to Moxifloxacin 400 mg IV once      daily.  EKG shows no signs of pericarditis.  There is no rub      audible on the physical exam.  No risk factors per pulmonary      embolism.  The O2 saturations are 98% on room air.  We will closely      follow the patient.  3. Community-acquired pneumonia.  The patient received one dose of      ceftriaxone in the ER.  Will change to Moxifloxacin 400 mg IV once      a day.  Will repeat chest x-ray to follow up the  evaluation.  4. Diabetes mellitus.  Currently the patient is on an insulin pump.      Will not put the patient on any long-acting insulin during her      n.p.o. after midnight.  Will continue the Accu-Cheks q.a.c. and      bedtime.  Will get Accu-Cheks  q.6 h. if she is n.p.o.  5. Rheumatoid arthritis.  Will continue the Arriva.  6. Hyperlipidemia.  Will check a fasting lipid panel in the morning.      Will continue Crestor.  7. Nausea and vomiting.  Unclear etiology.  The patient has no signs      of an acute abdomen.  I do not suspect small bowel obstruction at      this point.  The vomiting resolved.  The nausea is persistent.      Will treat symptomatically.  White blood cells within normal      limits.  Will check an amylase as well.  8. Paroxysmal atrial fibrillation.  This occurred following the CABG.      The patient was started on amiodarone.  The patient is currently in      sinus rhythm.  Will continue amiodarone at her home dose.  9.  Diabetic retinopathy.  The patient had panretinal photocoagulation.  10.Fluids, electrolytes and nutrition.  A saline well will be placed.      Will replace electrolytes as needed.  The patient will be made      n.p.o. after midnight.  11.DVT prophylaxis with subcutaneous Lovenox.   The code status is full.   DISPOSITION:  The patient is being admitted to cardiac floor with  telemetry.     ______________________________  Monia Sabal, MD      Hollice Espy, M.D.  Electronically Signed    HKA/MEDQ  D:  07/04/2009  T:  07/05/2009  Job:  [id number]

## 2011-05-15 NOTE — Assessment & Plan Note (Signed)
[facility]                            CARDIOLOGY OFFICE NOTE   NAME:[name], Meeker                        MRN:          [id number]  DATE:05/31/2009                            DOB:          1966/10/16    This note is in regard to the patient named [name].  She is a 45-  year-old woman with type 1 diabetes and coronary artery disease who  sustained an inferior wall MI in March of this year.  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  She was working in [employer] when she developed substernal  chest pain and was diagnosed with an acute myocardial infarction.  She  was treated with a drug-eluting stent in the right coronary artery.  She  has severe multivessel disease with total occlusion of the LAD filled  via collaterals by the distal right coronary artery.  The patient has  been seen by Dr. Tyrone Sage with CV[facility] and has also been evaluated with a  repeat catheterization as well as a nuclear stress scan.  All of this  was done in order to determine her best treatment option as well as  timing of potential surgical revascularization.   The patient was just seen in the office on June 1st, and we discussed  the results of her Myoview scan.  This scan demonstrated anteroapical  ischemia.  The patient has experienced increasing chest discomfort.  In  that setting, even with a relatively recent placement of a drug-eluting  stent, we have elected to proceed with coronary bypass surgery to  revascularize her LAD.  I discussed her case again with Dr. Tyrone Sage  today.  The plan is to admit her to the hospital tomorrow with her  Plavix on hold.  She will be treated with Integrilin over 4-5 days, and  then she will undergo coronary bypass surgery next week after a Plavix  washout.  The rationale for use of Integrilin is to minimize the risk of  stent thrombosis after a drug-eluting stent was used to treat  her  myocardial infarction 3 months ago.   The patient will be electively admitted to the hospital tomorrow.  She  will undergo pre-CABG Dopplers.  Dr. Tyrone Sage will see her in the  hospital and any further presurgical workup will be done there.  This  was all discussed at length with both the patient and her husband.  They  agree to proceed with further plans from Dr. Tyrone Sage.     Veverly Fells. Excell Seltzer, MD  Electronically Signed    MDC/MedQ  DD: 06/02/2009  DT: 06/02/2009  Job #: [id number]   cc:   Sheliah Plane, MD

## 2011-05-15 NOTE — Assessment & Plan Note (Signed)
OFFICE VISIT   Lisa, [name]  DOB:  11/29/1966                                        August 01, 2009  CHART #:  [id number]   HISTORY:  The patient is a 45 year old female, who is status post  coronary artery bypass grafting x3 on June 07, 2009, by Dr. Tyrone Sage for  recurrent angina.  She also has a history of a drug-eluting stent in the  right coronary artery.  She has a history of myocardial infarction.  She  reports that she is currently doing quite well.  She was readmitted  postdischarge and states that primarily the difficulty with this was  related to amiodarone.  This has subsequently been stopped.  There was  also some feeling that she may have a low-grade pneumonia and this was  also treated during her hospitalization.  On today's date, she reports  that overall she is feeling well.  She has returned to driving.  She is  having no difficulty with shortness of breath.  She denies chest pain.  She is doing the cardiac rehabilitation program.   PHYSICAL EXAMINATION:  VITAL SIGNS:  Blood pressure 115/74, pulse 68 and  regular, respirations 18, and oxygen saturation is 100% on room air.  GENERAL:  This is a well-developed adult female in no acute distress.  LUNGS:  Clear breath sounds throughout.  CARDIAC:  Regular rate and rhythm.  No murmurs, gallops, or rubs.  EXTREMITIES:  No edema.  SKIN:  Incisions are healing well without evidence of infection.   Chest x-ray was obtained on today's date.  It reveals the wires to be  intact.  There are no significant infiltrates and in fact it does show  some improvement of previous findings that were notable of mild  congestive changes.   ASSESSMENT:  The patient is doing quite well.  She is encouraged to  continue on her current treatment plan and cardiac rehabilitation.  She  is to follow up with Cardiology as a request.  She can return to work on  September 07, 2009, with no restrictions.  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  She is encouraged to continue her  ongoing diabetes  management as per her primary physician.  We will see her again on a  p.r.n. basis for any surgical issues as requested or required.   Rowe Clack, P.A.-C.   Sherryll Burger  D:  08/01/2009  T:  08/02/2009  Job:  [id number]   cc:   Alda Ponder D. Excell Seltzer, MD  Constance Haw, MD

## 2011-05-15 NOTE — Discharge Summary (Signed)
[name], [name]:  [id number]   MEDICAL RECORD NO.:  [id number]          PATIENT TYPE:  INP   LOCATION:  2013                         FACILITY:  [facility]   PHYSICIAN:  Lonia Blood, M.D.       DATE OF BIRTH:  03/25/1966   DATE OF ADMISSION:  07/04/2009  DATE OF DISCHARGE:  07/07/2009                               DISCHARGE SUMMARY   PRIMARY CARE PHYSICIAN:  Seymour Bars, D.Val [place] with [facility] [place].   DISCHARGE DIAGNOSES:  1. Nausea, vomiting, abdominal pain, probably due to amiodarone, also      rule out Helicobacter pylori, rule out gastroparesis.  2. Mild acute gastritis.  3. Coronary artery disease status post recent coronary artery bypass      grafting.  4. Diabetes mellitus type 1 with diabetic retinopathy.  5. Hypertension.  6. History of paroxysmal atrial fibrillation after the cardiac      surgery.  7. Rheumatoid arthritis.  8. Mild dehydration, resolved.  9. Status post cholecystectomy.   DISCHARGE MEDICATIONS:  1. Ramipril 2.5 mg daily.  2. Metoprolol 25 mg twice a day.  3. Plavix 75 mg daily.  4. Folic acid 1 mg daily.  5. Insulin pump.  6. Crestor 5 mg at bedtime.  7. Aspirin 81 mg daily.  8. Leflunomide 20 mg daily.  9. Protonix 40 mg daily.  10.Promethazine 25 mg suppository every 8 hours as needed for nausea.   CONDITION AT DISCHARGE:  [name] is discharged in good condition.  She  will follow up with her primary care physician, Dr. Seymour Bars next  week and with Dr. Tonny Bollman from Cardiology as previously  scheduled.  At the time of the discharge, the patient is able to eat a  regular diet without symptoms.   CONSULTATION DURING THIS ADMISSION:  The patient was seen in  consultation by Dr. Claudette Head from [facility].   PROCEDURES DURING THIS ADMISSION:  The patient underwent an upper  endoscopy by Dr. Claudette Head, also the patient underwent a transfusion  of 1 unit of packed red blood cells.   HISTORY AND PHYSICAL:  Refer to dictated H and P which was done by Dr.  Alfredia Client on July 04, 2009.   HOSPITAL COURSE:  [name] is a 45 year old woman who had a recent  coronary artery bypass grafting complicated by atrial fibrillation,  requiring treatment with amiodarone who presented to the emergency room  on July 04, 2009, complaining of persistent nausea, vomiting, and  abdominal pain.  She was also having angina.  The initial presentation  made the admitting physician be very concerned about unstable angina, so  [name] was admitted to Telemetry Unit, had cardiac enzymes checked,  and she was given full-dose Lovenox.  By hospital day #2, the patient  ruled out for myocardial infarction and we have discontinued the  anticoagulation and focused on her gastrointestinal complaints.  The  patient was taken off the amiodarone as a potential causative agent for  her symptoms, she received transfusion of 1 unit packed red blood cells  and then underwent  upper endoscopy with findings of mild antral  gastritis.  The biopsy for Helicobacter pylori was negative.  The  patient was then placed on a regular diet which she tolerated well  overnight.  We would recommend treatment with a proton pump inhibitor  for 1 month, and otherwise resuming all the old medication with the  exception of amiodarone.      Lonia Blood, M.D.  Electronically Signed     SL/MEDQ  D:  07/07/2009  T:  07/07/2009  Job:  [id number]   cc:   Venita Lick. Russella Dar, MD, Latanya Presser. Excell Seltzer, MD  Seymour Bars, D.O.

## 2011-05-18 NOTE — Op Note (Signed)
[place]  Patient:    [name], [name]. Visit Number: [id number] MRN: [id number]          Service Type: Attending:  Katy Fitch. Naaman Plummer., M.D. Dictated by:   Katy Fitch Naaman Plummer., M.D. Proc. Date: 02/13/02                             Operative Report  PREOPERATIVE DIAGNOSES: 1. Chronic stenosing tenosynovitis, right thumb flexor pollicis longus at A1    pulley. 2. Chronic stenosing tenosynovitis, right index finger at A1 pulley.  POSTOPERATIVE DIAGNOSES: 1. Chronic stenosing tenosynovitis, right thumb flexor pollicis longus at A1    pulley. 2. Chronic stenosing tenosynovitis, right index finger at A1 pulley. 3. Identification of hyperplastic synovium, right index finger flexor    digitorum superficialis tendon.  PROCEDURES: 1. Release of right thumb A1 pulley. 2. Release of right index finger A1 pulley.  SURGEON:  Katy Fitch. Sypher, Montez Hageman., M.D.  ASSISTANT:  Jonni Sanger, P.A.  ANESTHESIA:  0.25% Marcaine and 2% lidocaine metacarpal head-level block of right thumb and right index finger supplemented by IV sedation, supervised by the anesthesiologist, Maren Beach, M.D.  INDICATION:  [name] is a 45 year old woman with insulin-dependent diabetes.  She has had a past history of stenosing tenosynovitis and has required previous trigger finger releases.  She recently delivered twins and noted recurrent stenosing tenosynovitis of her right thumb at the A1 pulley and her right index finger.  She had crepitation with movement of the index finger deep to the A1 pulley.  She requested release of her A1 pulleys.  DESCRIPTION OF PROCEDURE:  [name] was brought to the operating room and placed in the supine position on the operating table.  Following light sedation, the right arm was prepped with Betadine soap and solution and sterilely draped.  When anesthesia was satisfactory, the arm was prepped with Betadine soap  and solution and sterilely draped.  Following exsanguination of the limb with an Esmarch bandage, an arterial tourniquet on the proximal brachium was inflated to 220 mmHg.  The procedure commenced with a short transverse incision directly over the palpably enlarged A1 pulley of the index finger.  Subcutaneous tissues were carefully divided, taking care to gently retract the neurovascular bundles. The A1 pulley was isolated, split with scissors and a scalpel.  The tendons were delivered and found to be invested with a thick membrane of tenosynovium that had several areas of coalescence that were causing crepitation.  These were resected with a rongeur.  Thereafter, full range of motion of the finger was recovered without crepitation.  Attention then directed to the thumb, which was exposed through a short transverse incision directly over the A1 pulley.  Subcutaneous tissues were carefully divided, taking care to identify and gently retract the radial proper digital nerve.  The A1 pulley was split with scalpel and scissors, followed by recovery of full range of motion of the thumb IP joint.  The wounds were then repaired with interrupted sutures of 5-0 nylon.  The hand was dressed with Xeroflo, sterile gauze, and Coban.  There were no apparent complications.  [name] tolerated the surgery and anesthesia well.  She was transferred to the recovery room with stable vital signs. Dictated by:   Katy Fitch Naaman Plummer., M.D. Attending:  Katy Fitch. Naaman Plummer., M.D. DD:  02/13/02 TD:  02/14/02 Job: [id number]

## 2011-05-18 NOTE — Op Note (Signed)
[name], [name]:  [id number]   MEDICAL RECORD NO.:  [id number]          PATIENT TYPE:  OUT   LOCATION:  XRAY                         FACILITY:  [facility]   PHYSICIAN:  Currie Paris, M.D.DATE OF BIRTH:  [birth year]   DATE OF PROCEDURE:  01/22/2005  DATE OF DISCHARGE:  01/05/2005                                 OPERATIVE REPORT   OFFICE MEDICAL RECORD NUMBER:  [id number].   PREOPERATIVE DIAGNOSIS:  Gallbladder polyp with biliary dyskinesia.   POSTOPERATIVE DIAGNOSIS:  Gallbladder polyp with biliary dyskinesia.   OPERATION:  Laparoscopic cholecystectomy with operative cholangiogram.   SURGEON:  Currie Paris, M.D.   ASSISTANT:  Rose Phi. Maple Hudson, M.D.   ANESTHESIA:  General endotracheal.   CLINICAL HISTORY:  The patient is a 45 year old who has been having biliary  type symptoms.  Initial gallbladder ultrasound showed a polyp.  Further  workup showing a hepatobiliary scan showed a marked delay in gallbladder  emptying.  After discussion with the patient, she elected to proceed to  cholecystectomy.   DESCRIPTION OF PROCEDURE:  The patient was taken to the holding area and she  had no further questions.  She was taken to the operating room and after  satisfactory general endotracheal anesthesia had been obtained, the abdomen  was prepped and draped.  0.25% plain Marcaine was used for each incision.  The umbilical incision was made, the fascia opened, and the peritoneal  cavity entered.   A pursestring was placed, the Hasson introduced, and the abdomen insufflated  to 15.  With the camera in place, we were able to place a 10-11 trocar in  the epigastrium, two 5 mm laterally in the right side.   The patient was in reverse Trendelenburg and tilted to the left.  The  gallbladder was retracted somewhat over the liver.  There were adhesions of  the duodenum, with the duodenum tethered about a third of the way up the  gallbladder, and these were  taken down with the cautery, being careful to  stay close to the gallbladder and away from the duodenum.   The gallbladder was then retracted laterally so I could open a window into  the triangle of Calot and dissected out the cystic artery and the cystic  duct and made a window behind them to be sure that we had the anatomy  identified correctly.   The cystic artery was clipped once, as was the cystic duct at its junction  with the gallbladder.  The cystic duct was opened and a Cook catheter  introduced percutaneously and placed in the cystic duct and held with a  clip.   Operative cholangiography was done showing good filling of the duodenum, no  filling defects, and filling up into the liver.   The cystic duct catheter was removed and three additional clips were placed  in the cystic duct and it was divided. Two additional clips were placed on  the cystic artery and it was divided.  As we came further up, posterior  branch was clipped and divided.  About a third of the way  up the back wall  of the gallbladder, another small branch was clipped and divided. The  gallbladder was fairly intrahepatic and we did spill a little bit of bile,  so once disconnected, I put it into a bag.  We spent several minutes  irrigating and checking the bed of the gallbladder to make sure everything  was dry.  Once it was, we brought the gallbladder out the umbilical port.  The abdomen was re-insufflated and a final irrigation and check for  hemostasis was made and again everything appeared dry.   Lateral ports were removed under direct vision and there was no bleeding.  The umbilical port was closed with a pursestring.  The abdomen was deflated  through the epigastric site.  A Vicryl was placed in the fascia here.  Skin  was closed with 4-0 Monocryl subcuticular and Dermabond.   The patient tolerated the procedure well.  There were no operative  complications.  All counts were correct.                                                ______________________________  Currie Paris, M.D.  Electronically Signed 01/22/2005 15:34:43 EST    CJS/MEDQ  D:  01/22/2005  T:  01/22/2005  Job:  [id number]

## 2011-05-18 NOTE — Op Note (Signed)
NAME:  [name], [name]:  [id number]   MEDICAL RECORD NO.:  [id number]                  PATIENT TYPE:   LOCATION:                                       FACILITY:   PHYSICIAN:  Claude Manges. Cleophas Dunker, M.D.            DATE OF BIRTH:   DATE OF PROCEDURE:  07/28/2002  DATE OF DISCHARGE:                                 OPERATIVE REPORT   PREOPERATIVE DIAGNOSES:  Lateral patella pressure syndrome left knee with  probable chondromalacia of patella.   POSTOPERATIVE DIAGNOSES:  1. Lateral patella pressure syndrome left knee, with chondromalacia of the     lateral patella facet.  2. Chondromalacia of lateral compartment.   PROCEDURE:  1. Diagnostic arthroscopy, left knee.  2. Shaving of the lateral tibial plateau, lateral femoral condyle, and     patella (lateral facet).  3. Arthroscopic lateral release.   SURGEON:  Claude Manges. Cleophas Dunker, M.D.   ANESTHESIA:  IV sedation and local 1% Xylocaine with epinephrine.   COMPLICATIONS:  None.   HISTORY:  A 45 year old female with persistent left knee pain without  obvious injury.  She has experiencing trouble now for perhaps three months.  The pain interferes with her activities.  It is localized along the lateral  patella.  She has had an MRI scan that was questionable for possible  articular cartilage defect.  Clinically, she has lateral patella pressure  syndrome and probably some lateral patella tilt.  Now to have arthroscopic  evaluation, as she has not responded to time, exercises, medicines, etc.   DESCRIPTION OF PROCEDURE:  The patient comfortable on the operating table  and underwent IV sedation.  The left lower extremity was placed in a thigh  holder.  The leg was then prepped with DuraPrep from the thigh holder to the  ankle.  Sterile draping was performed.  Diagnostic arthroscopy was performed  using a medial and lateral parapatellar tendon stab wound.  These portals  had been infiltrated with  Xylocaine preoperatively by Dr. Jacklynn Bue.  Diagnostic arthroscopy revealed no evidence of abnormality in the medial  compartment.  There was no evidence of chondromalacia.  The medial meniscus  was intact.  The ACL appeared to be intact.  In the lateral compartment,  there was softening of the articular cartilage representing grade 2 changes,  particularly at the lateral tibial plateau and probably grade 1 changes of  chondromalacia of the lateral femoral condyle.  The lateral meniscus was  intact.  These areas were then shaved. The patella was evaluated.  There was  definitely a lateral patella tilt.  There was pressure on the lateral facet  touching the lateral condyle, where there was no abutment on the medial  facet.  There was grade 1 and some grade 2 changes of the lateral patella  facet. There was some surrounding synovial tissue which I debrided and then  performed an arthroscopic intraarticular lateral release using the  long  hooked Bovie.  A guide needle was inserted in the superior pouch and  beginning at that level and coursing along the lateral capsule, I carefully  released the ligamentous fibers and then extended it along the lateral  portal.  There was an obvious deep tilting of the patella such that there  was now a space between the patella and the lateral femoral condyle.  The  trochlea was intact without evidence of chondromalacia.  There was no bleeding.  The two stab wounds were left open, infiltrated  0.25% Marcaine with epinephrine.  A sterile bulky dressing was applied  followed by a foam pad along the lateral patella. The patient tolerated the  procedure well without complications.   PLAN:  Percocet for pain, office one week.                                                Claude Manges. Cleophas Dunker, M.D.    PWW/MEDQ  D:  07/28/2002  T:  07/30/2002  Job:  [id number]

## 2011-05-18 NOTE — Discharge Summary (Signed)
[facility]  Patient:    [name], [name] Visit Number: [id number] MRN: [id number]          Service Type: OBS Location: [id number] Attending Physician:  Melony Overly Admit Date:  09/05/2001 Discharge Date: 09/10/2001                             Discharge Summary  ADMISSION DIAGNOSES:          1. Threatened preterm labor.                               2. Gestational diabetes.                               3. Twin pregnancy.  BRIEF HISTORY:                The patient is a 45 year old white female, gravida 3, para 0, with an estimated date of confinement of November 04, 2001, at 28 weeks, who was admitted for preterm uterine contractions.  She is noted to have a twin gestation.  Her pregnancy has been complicated by insulin-dependent diabetes mellitus.  The patient presented to the [facility] [place] with the onset of uterine contractions with her cervix being soft, and lower segment approximately 80% effaced.  Because of the preterm contractions, the patient was admitted and started on magnesium sulfate, Unasyn 3 grams IV q.6h, and was given steroids.  In addition, she was continued on her insulin pump.  HOSPITAL COURSE:              The patient remained stable and it was possible to taper her from the magnesium sulfate and begin Procardia 20 mg t.i.d.  On this regimen, she stabilized and contractions arrested and by August 16, 2001, she was in satisfactory condition and able to be discharged home.  She was continued on her insulin therapy, prenatal vitamins, and Procardia 20 mg t.i.d.  She was to be reassessed in the office on August 18, 2001.  Ultrasound which was carried out at [facility] [place] during this admission revealed twin gestation with mean gestational age of baby A of 27 weeks 6 days and estimated fetal weight of 1193 grams.  Baby A being vertex.  Baby Bs ultrasound showed mean gestational age of [redacted] weeks 3  days and oblique presentation was noted.  The estimated fetal weight was 1246 grams. Attending Physician:  Melony Overly DD:  10/28/01 TD:  10/28/01 Job: [id number]

## 2011-05-18 NOTE — Op Note (Signed)
NAME:  ADRIJANA, HAROS                           ACCOUNT NO.:  [id number]   MEDICAL RECORD NO.:  [id number]                   PATIENT TYPE:  AMB   LOCATION:  DSC                                  FACILITY:  [facility]   PHYSICIAN:  Feliberto Gottron. Turner Daniels, M.D.                DATE OF BIRTH:  10/03/1966   DATE OF PROCEDURE:  08/23/2003  DATE OF DISCHARGE:                                 OPERATIVE REPORT   PREOPERATIVE DIAGNOSIS:  Right shoulder impingement syndrome with a 1-cm  type 2 subacromial spur.   POSTOPERATIVE DIAGNOSES:  1. Right shoulder impingement syndrome with a 1-cm type 2 subacromial spur.  2. Superior anterior labral tear relatively small, partial thickness.   PROCEDURE:  Right shoulder arthroscopic anteroinferior acromioplasty and  debridement of partial thickness superior  anterior labral tear.   SURGEON:  Feliberto Gottron. Turner Daniels, M.D.   FIRST ASSISTANT:  Erskine Squibb B. Jannet Mantis.   ANESTHESIA:  Anterior scalene block with general endotracheal anesthesia.   ESTIMATED BLOOD LOSS:  Minimal.   FLUIDS REPLACED:  1200 mL Crystalloid.   DRAINS:  None.   TOURNIQUET TIME:  None.   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 She also has a history  of diabetes that is well controlled. In any event she has failed  conservative treatment with anti-inflammatory medications. She got good  temporary relief from a cortisone injection and is taken for arthroscopic  decompression, since her radiographs do show a 1-cm type subacromial spur.   DESCRIPTION OF PROCEDURE:  The patient is identified by armband and taken to  the operating room at [facility]. Appropriate  anesthetic monitors were attached and interscalene block anesthesia was  induced with the patient in the supine position. This was followed  by  general endotracheal anesthesia and she was then placed in the beachchair  position and the right upper extremity prepped and draped in the usual  sterile fashion from the wrist to the hemithorax.   Using a #11 blade, standard portals were then made 1.5 cm posterior to the  posterolateral coroner of the acromion, lateral to the junction of the  middle and posterior thirds of the acromion and anterior to the  acromioclavicular joint. The inflow was placed anteriorly,  the arthroscope  laterally and a 4.2 [place] sucker shaver posteriorly, allowing  subacromial bursectomy as well as cauterization of any small bleeders that  we encountered.   We immediately uncovered the type 2 subacromial spur and this was removed  with a 4.5 hooded vortex bur. The acromioclavicular joint appeared to be in  good condition. The rotator cuff was evaluated externally all along  the  supraspinatus and infraspinatus insertions and was found to be intact.   The arthroscope was then repositioned into the glenohumeral joint using the  posterior  portal and diagnostic arthroscopy again revealed an intact rotator  cuff, some very minor fraying of the biceps which did not require any  debridement, and a small  partial thickness tear of the superior anterior  labrum on the inner free edge which was debrided back to a stable margin.  The articular cartilage of the glenohumeral joint was in good condition.   At this point the shoulder was irrigated with normal saline solution. The  arthroscopic instruments were removed and a dressing  of Xeroform, 4 x 4  dressing sponges and paper tape applied.   The patient was awakened. She was taken to the recovery room without  difficulty.                                               Feliberto Gottron. Turner Daniels, M.D.    Ovid Curd  D:  08/23/2003  T:  08/23/2003  Job:  [id number]

## 2011-05-18 NOTE — Op Note (Signed)
. [facility]  Patient:    [name]                         MRN: [id number] Proc. Date: 01/08/00 Adm. Date:  [id number] Attending:  Ladona Ridgel                           Operative Report  PREOPERATIVE DIAGNOSIS:  Recurrent vitreous hemorrhage with proliferative diabetic retinopathy, right eye.  POSTOPERATIVE DIAGNOSES: 1. Recurrent vitreous hemorrhage with proliferative diabetic retinopathy, right    eye. 2. Early anterior neovascularization, right eye.  PROCEDURES:  Pars plana vitrectomy, vitreous wash out, and supplemental panretinal photocoagulation, right eye.  SURGEON:  Ladona Ridgel, M.D.  ASSISTANT:  ANESTHESIA:  General endotracheal.  ESTIMATED BLOOD LOSS:  Less than 1 cc.  COMPLICATIONS:  None.  DESCRIPTION OF PROCEDURE:  The patient was taken to the operating room where after induction of general anesthesia, the right eye was prepped and draped in the usual fashion.  Fornix based conjunctival peritomies were developed over the future sclerotomy sites and then sclerotomies fashioned at 1:30, 10, and 8 oclock. The superior sclerotomies were plugged and infusion cannula was secured to the 8 oclock sclerotomy with a temporary suture of 7-0 Vicryl.  The tip was visually inspected and found to be in good position.  A Landers ring was then secured to  the globe with 7-0 Vicryl sutures at 3 and 9 oclock.  The plugs were removed and the 30-degree  _______ lens applied to the surface of the eye.  The central vitreous space was washed out.  Filmy anterior vitreous could be seen around the edges of the native lens with a clot in the vitreous adherent to the lens at 6 oclock.  The vitrector was used to trim the anterior vitreous around the periphery of the lens.  The clot on the posterior surface of the lens at 6 oclock was trimmed on both sides and then scleral depression was used to further debulk the inferior  hemorrhagic vitreous base from the bottom 180 degrees.  Using a curved silicone tipped catheter, further attempts were made in trying to strip the vitreous off of the posterior aspect of the lens.  On small amounts of clot could be removed and a small amount of clot was left adherent to the inferior aspect f the lens.  The instruments were removed from the eye and the hole was plugged. The Stryker ophthalmoscope showed there to be no peripheral retinal breaks or tears.  A filmy vessel could be seen on the anterior aspect of the retina at approximately 9 oclock.  This was adjacent to the area of vitreous space hemorrhage.  The  _______ laser was then used to supplement the panretinal photocoagulation only  out to the oral serrata 360 degrees of heavy photocoagulation in the area of the suspected anterior neovascularization.  Scattered photocoagulation was also applied heading towards the vascular arcades with stopping prior to the treatment of fresh retina posteriorly.  The superior sclerotomies were then closed with 7-0 Vicryl. The infusion cannula was removed.   _______ placement was secure.  The conjunctiva was  _______ and reapproximated with interrupted running suture of 6-0 plain gut. The pressure was checked with  _______ scotometer and found to lie at approximately 18.  The subconjunctival space was irrigated with 0.75% Marcaine followed by subconjunctival injection of 100 mg of ceftazidime and  10 mg of Decadron.  A lid speculum was then removed and  _______ ointment applied to the  surface of the eye.  An eye patch and shield were placed over the patients right eye.  Upon awakening from anesthesia, the patient left the operating room in stable condition. DD:  01/08/00 TD:  01/08/00 Job: [id number]

## 2011-05-18 NOTE — Op Note (Signed)
NAME:  [name], [name]                           ACCOUNT NO.:  [id number]   MEDICAL RECORD NO.:  [id number]                   PATIENT TYPE:  OIB   LOCATION:  2550                                 FACILITY:  [facility]   PHYSICIAN:  Lanna Poche, M.D.              DATE OF BIRTH:  [birth year]   DATE OF PROCEDURE:  03/01/2003  DATE OF DISCHARGE:                                 OPERATIVE REPORT   PREOPERATIVE DIAGNOSIS:  Intravitreal hemorrhage with proliferative diabetic  retinopathy, left eye.   POSTOPERATIVE DIAGNOSIS:  Intravitreal hemorrhage with proliferative  diabetic retinopathy, left eye.   PROCEDURE:  Pars plana vitrectomy, membrane peeling, and supplemental  panretinal photocoagulation, left eye.   SURGEON:  Lanna Poche, M.D.   ASSISTANTWestly Pam.   ANESTHESIA:  General endotracheal.   ESTIMATED BLOOD LOSS:  Less than 1 mL.   COMPLICATIONS:  None.   DESCRIPTION OF PROCEDURE:  The patient was taken to the operating room,  where after induction of general anesthesia the left eye was prepped and  draped in the usual fashion.  The lid speculum was introduced and  conjunctival peritomy was developed temporally and superonasally with  relaxing incisions at 10, 11, and 3.  Hemostasis obtained with Eraser  cautery.  Sclerotomies were fashioned 3.75 mm posterior to the limbus at  1:30, 10:30, and 4:30.  The superior sclerotomies were plugged and a 4 mm  infusion cannula was secured at the 4:30 sclerotomy with a temporary suture  of 7-0 Vicryl.  The tip was visually inspected and found to be in good  position.  The Landers ring was secured to the globe with 7-0 Vicryl sutures  at 3 and 9.  The plug was removed and a 30 degree prismatic lens applied to  the surface of the eye.  A central core followed by a peripheral vitrectomy  was performed.  There was no vitreous separation.  Going out to the far  periphery inferiorly, a plane was then able to be encountered between  the  anterior and posterior vitreous attachments.  This was continued around in a  clockwise fashion.  There were numerous small pinpoint areas of vitreous-to-  retinal adhesions with small neovascular stumps, and these were able to be  trimmed with the vitrector without difficulty.  Scleral depression was used  to trim the vitreous base inferiorly and remove the last adhesions which  were pressing down, approximately four to five.  No __________ plates were  done, and then the Endocautery was used after the complete vitrectomy had  been performed to cauterize the small bleeding stumps that were seen, these  were mainly inferotemporally and nasally.  No further bleeding was seen to  exude from these areas.  A silicone-tipped catheter was then used to  aspirate the small amount of blood that had settled in the posterior  segment.  The instruments were removed from  the eye as the hole was plugged.  The Landers lens and ring were removed.  Inspection with the indirect  ophthalmoscope and scleral depression revealed there to be no peripheral  retinal breaks or tears, and the indirect laser was used for supplemental  panretinal photocoagulation.  The superior sclerotomies were then closed  with 7-0 Vicryl.  The infusion cannula was removed and the preplaced suture  was secured.  The conjunctiva was drawn up and reapproximated with  interrupted and running suture of 6-0 plain gut.  The pressure was checked  with a Barraquer tonometer and found to lay at 21.  The subconjunctival  space was irrigated with 0.75% Marcaine, followed by subconjunctival  injection of 100 mg of ceftazidime and 10 mg of Decadron.  The lid speculum  was then removed and a mixed antibiotic ointment applied to the surface of  the eye.  An eye patch and shield was then placed over the patient's left  eye.  Following awakening from the anesthesia, the patient left the  operating room in stable condition.                                                Lanna Poche, M.D.    JTH/MEDQ  D:  03/01/2003  T:  03/01/2003  Job:  [id number]

## 2011-05-18 NOTE — Op Note (Signed)
[facility]  Patient:    [name], [name] Visit Number: [id number] MRN: [id number]          Service Type: OBS Location: [id number] Attending Physician:  Melony Overly Dictated by:   Devoria Albe Edward Jolly, M.D. Proc. Date: 09/05/01 Admit Date:  09/05/2001                             Operative Report  PREOPERATIVE DIAGNOSES:       1. Intrauterine pregnancy at 31+3 weeks.                               2. Twin gestation.                               3. Nonreassuring fetal assessment of Twin A.                               4. Insulin-dependent diabetes mellitus with                                  retinopathy.  POSTOPERATIVE DIAGNOSES:      1. Intrauterine pregnancy at 31+3 weeks.                               2. Twin gestation.                               3. Nonreassuring fetal assessment of Twin A.                               4. Insulin-dependent diabetes mellitus with                                  retinopathy.  PROCEDURE:                    Primary low segment transverse cesarean section.  SURGEON:                      Brook A. Edward Jolly, M.D.  ASSISTANT:                    Gerrit Friends. Aldona Bar, M.D.  ANESTHESIA:                   Spinal.  INTRAVENOUS FLUIDS:           700 cc Ringers lactate.  ESTIMATED BLOOD LOSS:         800 cc.  URINE OUTPUT:                 75 cc.  COMPLICATIONS:                None.  INDICATIONS:                  The patient was a 45 year old, gravida 3, para 0-0-2-0, Caucasian female at 31+[redacted] weeks gestation, [facility] November 04, 2001, with  a known twin gestation conceived with Clomid and with known insulin-dependent diabetes mellitus for 26 years with a history of retinopathy, presently being treated for preterm uterine activity with Procardia, who on a nonstress test on September 05, 2001 was noted to have a nonreactive fetal heart rate tracing and two late decelerations of Twin A with a reactive nonstress test of Twin B.  The patient had been followed earlier in the week for NSTs on September 3 and September 4 at which time Twin A also had a nonreactive fetal heart rate tracing and Twin B had a reactive fetal heart rate tracing. These were followed by BPPs on each occasion which were 8/8 for both twins.  The patient was status post betamethasone on August 13 and August 14 for fetal lung maturity when she was admitted and treated for preterm labor with magnesium sulfate. The patients glucose control was noted to be good, and this was maintained via an insulin pump.  After the patient had the nonstress test performed in Maternity Admissions at the [facility], an IV was started, and the patient was sent to radiology for a biophysical profile which documented a 6/8 for Twin A.  The patient was admitted and she was monitored throughout the course of the day. The patient began to have recurrent late heart rate decelerations of Twin A ultimately followed by decreased beat-to-beat variability. The fetal heart rate tracing of Twin B remained reactive.  A discussion was held with the patient at this time regarding a nonreassuring fetal assessment of Twin A, and a recommendation was made to proceed with delivery by a primary cesarean section. The patient chose to proceed after the risks and benefits were reviewed with her.  FINDINGS:                     Twin A was delivered as a footling breech at 2312 with Apgars of 5 at one minute and 8 at five minutes. The weight was 1152 g. There were no gross abnormalities appreciated of the fetus. Clear fluid was noted. The patient was escorted to the NICU by the NICU team. No intubation was required.  Twin B delivered also a footling breech at 2316 with Apgars of 5 at one minute and 7 at five minutes. The weight was 1731 g. Again, there were no gross abnormalities appreciated, and the amniotic fluid was noted to be clear. Twin B was also escorted to the NICU by the  NICU team, and not intubation was required.  SPECIMENS:                    The placenta was sent to pathology with cord B doubly clamped.  DESCRIPTION OF PROCEDURE:     With an IV and a Foley catheter in place, the patient was escorted from her labor and delivery suite to the operating room. The patient received a spinal anesthetic. She was then placed in the supine position the operating room table. The abdomen was then sterilely prepped and draped. Adequate anesthesia was insured.  A Pfannenstiel incision was created sharply with a scalpel. This was carried down to the fascia using the same. The fascia was then scored in the midline with the scalpel, and the incision was carried out bilaterally using a Mayo scissors. The fascia was dissected off of the underlying rectus muscles inferiorly and superiorly using a Mayo scissors. The rectus muscles were then separated in the midline using both blunt and sharp dissection with  the Mayo scissors. The parietal peritoneum was grasped with two hemostat clamps and was entered sharply with the Metzenbaum scissors. The incision was extended cranially and caudally with the same.  The bladder retractor was placed over the bladder, the lower uterine segment was exposed. A transverse incision was created in the lower uterine segment with the scalpel. This was carried out bilaterally using bandage scissors. An Allis clamp was then used to rupture the bag of Twin A, and clear fluid was noted. A hand was inserted through the incision. The head was noted to rotate out of the way, and the fetus was instead delivered as a footling breech. Each of the feet and buttocks were delivered prior to rotation of the torso with delivery of each of the shoulders followed by delivery of the fetal vertex with flexion of the head. The nares and mouth were suctioned, and the cord was doubly clamped and cut, and the newborn was carried over to the  awaiting pediatricians.  Next, the feet of Twin B were grasped, and the membranes were ruptured with an  Allis clamp. Clear fluid was appreciated. Again, Twin B was also delivered as a footling breech presentation. After delivery of the feet and hips bilaterally, the torso and then the shoulders were delivered by gentle rotation of the newborn. The head was delivered, again with flexion. The nares and mouth were suctioned, and the cord was triply clamped and cut. The newborn was carried over to the awaiting pediatricians. Cord blood was obtained from each of the umbilical cords which was noted to have minimal blood within it. the placenta was then manually extracted, and the uterus was wiped clean of any remaining products of conception with a moistened lap pad.  The patient did receive Pitocin 20 units IV and Cefotetan 1 g IV. The uterine incision was closed as a single layer closure of #1 chromic as a locked suture. Hemostasis was noted to be excellent following this. The abdominal cavity was irrigated and suctioned with crystalloid solution. Again, the uterine incision demonstrated excellent hemostasis. The fascia was closed with a running suture of 0 Vicryl. Subcuticular interrupted sutures of 3-0 plain were placed after the subcutaneous layer was irrigated, suctioned, and cauterized of small bleeding vessels with monopolar cautery. The subcuticular closure was performed with 3-0 ______ . The skin edges were covered with Steri-Strips followed by a sterile bandage and pressure dressing.  The uterus was expressed of any remaining clots. The patient was escorted to the recovery room in stable and awake condition. There were no complications. All needle, instrument, and lap counts were correct. Dictated by:   Devoria Albe Edward Jolly, M.D. Attending Physician:  Melony Overly DD:  09/06/01 TD:  09/06/01 Job: [id number]

## 2011-05-18 NOTE — Discharge Summary (Signed)
[facility]  Patient:    [name], [name] Visit Number: [id number] MRN: [id number]          Service Type: OBS Location: [phone] Attending Physician:  Melony Overly Admit Date:  09/05/2001 Discharge Date: 09/10/2001                             Discharge Summary  ADMISSION DIAGNOSES:          1. Intrauterine twin pregnancy at 31-3/7 weeks.                               2. Nonreassuring fetal assessment.                               3. Insulin-dependent diabetes mellitus with                                  retinopathy.  PROCEDURE:                    Primary low transverse cesarean section and spinal anesthesia.  BRIEF HISTORY:                The patient is a 45 year old female, gravida 3, para 0-0-2-0, at 31-3/7 weeks with an estimated date of confinement of September 04, 2001.  She was admitted in preterm labor on September 6.  The patient was evaluated with nonstress tests and these appeared to be nonreactive.  HOSPITAL COURSE:              After further evaluation with biophysical profiles and Doppler studies, it was felt that fetal indications were present for delivery of the twins and the patient was taken to the operating room where under spinal anesthesia, a primary low transverse cesarean section was carried out.  This was done without difficulty with twin A being a footling breech weighing 3212 grams with an Apgars of 5 at one minute and 8 at five minutes.  The weight of the twin was 1152 grams.  The second twin was also a footling breech weighing 1731 grams with an Apgars of 5 at one minute and 7 at five minutes.  The patients postoperative course was essentially uncomplicated.  She did tolerate increasing ambulation and diet well and by September 11, was in satisfactory condition and felt stable enough to be sent home.  DISCHARGE MEDICATIONS:        1. Claritin as needed for congestion.                               2. Tylox one  every three hours as needed for                                  pain.                               3. Chromagen one p.o. daily.  FOLLOW-UP:                    The patient was instructed to return  to the office in five or six weeks for follow-up examination, to do no heavy lifting, to place nothing in the vagina, and to call if there are any problems such as fever, pain, or heavy bleeding. Attending Physician:  Melony Overly DD:  10/15/01 TD:  10/15/01 Job: [id number]

## 2011-09-10 ENCOUNTER — Other Ambulatory Visit: Payer: Self-pay | Admitting: *Deleted

## 2011-09-10 MED ORDER — ROSUVASTATIN CALCIUM 10 MG PO TABS: 5.0000 mg | ORAL_TABLET | Freq: Every day | ORAL | Status: DC

## 2011-12-05 DIAGNOSIS — Z79899 Other long term (current) drug therapy: Secondary | ICD-10-CM | POA: Insufficient documentation

## 2012-01-11 DIAGNOSIS — E113593 Type 2 diabetes mellitus with proliferative diabetic retinopathy without macular edema, bilateral: Secondary | ICD-10-CM | POA: Insufficient documentation

## 2012-01-16 ENCOUNTER — Telehealth: Payer: Self-pay | Admitting: Cardiovascular Disease

## 2012-01-16 NOTE — Telephone Encounter (Signed)
New problem Pt wants to recommendation to a cardiologist at [facility]. Please call

## 2012-01-17 NOTE — Telephone Encounter (Signed)
Dr Excell Seltzer recommends Dr Gwendalyn Ege but he will not start practicing until this summer. I spoke with the pt and made her aware of this information and she actually knows Dr Mayford Knife.  The pt did schedule an appointment with Dr Excell Seltzer.

## 2012-01-24 ENCOUNTER — Encounter: Payer: Self-pay | Admitting: Cardiovascular Disease

## 2012-01-31 ENCOUNTER — Encounter: Payer: Self-pay | Admitting: Cardiovascular Disease

## 2012-02-04 ENCOUNTER — Encounter: Payer: Self-pay | Admitting: *Deleted

## 2012-02-04 ENCOUNTER — Encounter: Payer: Self-pay | Admitting: Cardiovascular Disease

## 2012-02-05 ENCOUNTER — Encounter: Payer: Self-pay | Admitting: Cardiovascular Disease

## 2012-02-05 ENCOUNTER — Ambulatory Visit (INDEPENDENT_AMBULATORY_CARE_PROVIDER_SITE_OTHER): Payer: No Typology Code available for payment source | Admitting: Cardiovascular Disease

## 2012-02-05 ENCOUNTER — Other Ambulatory Visit: Payer: Self-pay | Admitting: *Deleted

## 2012-02-05 VITALS — BP 120/70 | HR 68 | Ht 63.0 in | Wt 161.0 lb

## 2012-02-05 DIAGNOSIS — I498 Other specified cardiac arrhythmias: Secondary | ICD-10-CM

## 2012-02-05 DIAGNOSIS — E785 Hyperlipidemia, unspecified: Secondary | ICD-10-CM

## 2012-02-05 DIAGNOSIS — I2109 ST elevation (STEMI) myocardial infarction involving other coronary artery of anterior wall: Secondary | ICD-10-CM

## 2012-02-05 DIAGNOSIS — I25810 Atherosclerosis of coronary artery bypass graft(s) without angina pectoris: Secondary | ICD-10-CM

## 2012-02-05 MED ORDER — ROSUVASTATIN CALCIUM 10 MG PO TABS: 10.0000 mg | ORAL_TABLET | Freq: Every day | ORAL | Status: DC

## 2012-02-05 MED ORDER — METOPROLOL TARTRATE 25 MG PO TABS: 25.0000 mg | ORAL_TABLET | Freq: Two times a day (BID) | ORAL | Status: DC

## 2012-02-05 NOTE — Assessment & Plan Note (Signed)
The patient had recent lipids drawn and these were reviewed. Total cholesterol is 175, triglycerides 103, HDL 62, and LDL 92. I have recommended that she increase Crestor to 10 mg daily to try to push her LDL closer to 70.

## 2012-02-05 NOTE — Assessment & Plan Note (Signed)
The patient is stable without exertional angina. She has had some chest pains but they are atypical in nature. Her 12-lead EKG is unremarkable and her resting heart rate is back in the 60s (see was previously 120s when she was hyperthyroid). I think we should continue with her current medical program at the present time. I am now and out of network provider for her and she would like to change to a provider in [place]. She is going to check to see if Dr. Gwendalyn Ege who I have recommended will be in her network.

## 2012-02-05 NOTE — Progress Notes (Signed)
HPI:  46 year old woman presenting for follow up evaluation. The patient has premature coronary artery disease and is status post coronary bypass surgery. She is also followed for hypertension, diabetes, hyperlipidemia, and rheumatoid arthritis. She initially presented with an inferior wall MI in 2010. She was treated with primary PCI utilizing a drug-eluting stent in the right coronary artery. She was found to have total occlusion of the LAD and ultimately underwent coronary bypass surgery by Dr. Tyrone Sage later that year.  Charlyn has had a lot of problems with regulation of her thyroid. She presented last year with persistent tachycardia and was noted to be hyperthyroid. She has recently been followed by endocrinology at [facility]. She has reported episodes of chest pain, shortness of breath, and palpitations. She thinks a lot of this is related to her thyroid disease. She has undergone a stress test following bypass surgery and showed no ischemia.  Outpatient Encounter Prescriptions as of 02/05/2012  Medication Sig Dispense Refill  . Abatacept (ORENCIA [place]) Inject into the skin once a week.      Marland Kitchen aspirin 81 MG tablet Take 160 mg by mouth daily.      . Insulin Lispro, Human, (HUMALOG [place]) Inject into the skin.      Marland Kitchen leflunomide (ARAVA) 20 MG tablet Take 20 mg by mouth daily.      . methimazole (TAPAZOLE) 5 MG tablet Take 5 mg by mouth daily.      . metoprolol tartrate (LOPRESSOR) 25 MG tablet Take 1 tablet (25 mg total) by mouth 2 (two) times daily.  60 tablet  0  . Multiple Vitamin (MULTIVITAMIN) capsule Take 1 capsule by mouth daily.      . ramipril (ALTACE) 5 MG capsule Take 5 mg by mouth daily.      . rosuvastatin (CRESTOR) 10 MG tablet Take 0.5 tablets (5 mg total) by mouth daily.  30 tablet  1    Allergies  Allergen Reactions  . Prochlorperazine Edisylate     Past Medical History  Diagnosis Date  . SINUS TACHYCARDIA 11/08/2010  . MIGRAINE W/O AURA W/INTRACT W/STATUS  MIGRAINOSUS 02/19/2008  . HYPERLIPIDEMIA-MIXED   . CAD, ARTERY BYPASS GRAFT   . Atrial fibrillation   . ANGINA, STABLE/EXERTIONAL   . ACUT MI ANTEROLAT WALL SUBSQT EPIS CARE   . URI   . TRIGGER FINGER   . Shortness of breath   . HYPERTHYROIDISM   . Edema   . DIZZINESS   . Diarrhea   . DIABETIC  RETINOPATHY   . DIABETES MELLITUS, TYPE I   . DERMATITIS, ALLERGIC   . CHOLELITHIASIS   . CHEST PAIN   . CARPAL TUNNEL SYNDROME, BILATERAL   . ARTHRITIS, RHEUMATOID   . ALLERGIC RHINITIS, SEASONAL   . Acute maxillary sinusitis     ROS: Negative except as per HPI  BP 120/70  Pulse 68  Ht 5\' 3"  (1.6 m)  Wt 73.029 kg (161 lb)  BMI 28.52 kg/m2  PHYSICAL EXAM: Pt is alert and oriented, NAD HEENT: normal Neck: JVP - normal, carotids 2+= without bruits Lungs: CTA bilaterally CV: RRR without murmur or gallop Abd: soft, NT, Positive BS, no hepatomegaly Ext: no C/C/E, distal pulses intact and equal Skin: warm/dry no rash  EKG:  Normal sinus rhythm 68 beats per minute, low voltage QRS, cannot rule out anterior infarct age undetermined  ASSESSMENT AND PLAN:

## 2012-02-05 NOTE — Assessment & Plan Note (Signed)
Resolved as she has become euthyroid.

## 2012-02-05 NOTE — Patient Instructions (Signed)
Your physician has recommended you make the following change in your medication: INCREASE Crestor to 10mg  take one by mouth daily  Your physician recommends that you schedule a follow-up appointment as needed.  (Pt is transferring care to [facility] or [facility])

## 2012-03-12 ENCOUNTER — Other Ambulatory Visit: Payer: Self-pay

## 2012-03-12 MED ORDER — METOPROLOL TARTRATE 25 MG PO TABS: 25.0000 mg | ORAL_TABLET | Freq: Two times a day (BID) | ORAL | Status: DC

## 2012-04-23 DIAGNOSIS — M216X9 Other acquired deformities of unspecified foot: Secondary | ICD-10-CM | POA: Insufficient documentation

## 2012-04-23 DIAGNOSIS — M7750 Other enthesopathy of unspecified foot: Secondary | ICD-10-CM | POA: Insufficient documentation

## 2012-04-30 DIAGNOSIS — Z9849 Cataract extraction status, unspecified eye: Secondary | ICD-10-CM | POA: Insufficient documentation

## 2012-06-17 DIAGNOSIS — H35379 Puckering of macula, unspecified eye: Secondary | ICD-10-CM | POA: Insufficient documentation

## 2012-07-11 DIAGNOSIS — M7660 Achilles tendinitis, unspecified leg: Secondary | ICD-10-CM | POA: Insufficient documentation

## 2012-11-10 DIAGNOSIS — M7060 Trochanteric bursitis, unspecified hip: Secondary | ICD-10-CM | POA: Insufficient documentation

## 2013-01-21 DIAGNOSIS — L409 Psoriasis, unspecified: Secondary | ICD-10-CM | POA: Insufficient documentation

## 2013-02-05 DIAGNOSIS — L281 Prurigo nodularis: Secondary | ICD-10-CM | POA: Insufficient documentation

## 2013-02-05 DIAGNOSIS — I872 Venous insufficiency (chronic) (peripheral): Secondary | ICD-10-CM | POA: Insufficient documentation

## 2013-03-30 DIAGNOSIS — H02825 Cysts of left lower eyelid: Secondary | ICD-10-CM | POA: Insufficient documentation

## 2013-03-30 DIAGNOSIS — Z961 Presence of intraocular lens: Secondary | ICD-10-CM | POA: Insufficient documentation

## 2013-03-31 DIAGNOSIS — H26499 Other secondary cataract, unspecified eye: Secondary | ICD-10-CM | POA: Insufficient documentation

## 2013-03-31 DIAGNOSIS — H53489 Generalized contraction of visual field, unspecified eye: Secondary | ICD-10-CM | POA: Insufficient documentation

## 2013-03-31 DIAGNOSIS — D485 Neoplasm of uncertain behavior of skin: Secondary | ICD-10-CM | POA: Insufficient documentation

## 2013-04-21 DIAGNOSIS — H029 Unspecified disorder of eyelid: Secondary | ICD-10-CM | POA: Insufficient documentation

## 2013-06-15 ENCOUNTER — Telehealth: Payer: Self-pay | Admitting: Cardiovascular Disease

## 2013-06-15 ENCOUNTER — Encounter: Payer: Self-pay | Admitting: Cardiovascular Disease

## 2013-06-15 ENCOUNTER — Ambulatory Visit (INDEPENDENT_AMBULATORY_CARE_PROVIDER_SITE_OTHER): Payer: No Typology Code available for payment source | Admitting: Cardiovascular Disease

## 2013-06-15 VITALS — BP 125/80 | HR 64 | Ht 63.0 in | Wt 165.4 lb

## 2013-06-15 DIAGNOSIS — R079 Chest pain, unspecified: Secondary | ICD-10-CM

## 2013-06-15 DIAGNOSIS — I2510 Atherosclerotic heart disease of native coronary artery without angina pectoris: Secondary | ICD-10-CM

## 2013-06-15 NOTE — Telephone Encounter (Signed)
Returned call to patient she stated she has been having chest pain off and on for 1 week.Stated she saw pcp at [facility] was done and did reveal changes.Stated she is having chest tightness this morning and would like to be seen.Spoke with DOD Dr.McAlhany he advised will see patient today at 12:30.

## 2013-06-15 NOTE — Telephone Encounter (Signed)
New problem   Pt has been having chest pains and wants to be seen-could not find any available appts w/phy or pa-please call pt to advice

## 2013-06-15 NOTE — Progress Notes (Signed)
History of Present Illness: 47 yo female with history of CAD s/p CABG 2010, HTN, DM, HLD and RA who is here today as an add-on for evaluation of chest pain. Her CAD is followed by Dr. Colman Cater seen in our office in February 2013. She initially presented with an inferior wall MI in 2010. She was treated with primary PCI utilizing a drug-eluting stent in the right coronary artery. She was found to have total occlusion of the LAD and ultimately underwent coronary bypass surgery by Dr. Tyrone Sage later that year. She has had issues with regulation of her thyroid and tachycardia. She has been followed by endocrinology at [facility]. She has reported had occasional episodes of chest pain, shortness of breath, and palpitations over the last few years. She has undergone a stress test following bypass surgery and showed no ischemia.  She tells me today that she has had continuous mild chest discomfort for several weeks. This has not gone away, even at rest. The pain is described as a mild heaviness across her chest without radiation. She also notes SOB with exertion and at rest. She has had some nausea. Her EKG is unchanged. She does not smoke.   Past Medical History  Diagnosis Date  . SINUS TACHYCARDIA 11/08/2010  . MIGRAINE W/O AURA W/INTRACT W/STATUS MIGRAINOSUS 02/19/2008  . HYPERLIPIDEMIA-MIXED   . CAD, ARTERY BYPASS GRAFT   . Atrial fibrillation   . ANGINA, STABLE/EXERTIONAL   . ACUT MI ANTEROLAT WALL SUBSQT EPIS CARE   . URI   . TRIGGER FINGER   . Shortness of breath   . HYPERTHYROIDISM   . Edema   . DIZZINESS   . Diarrhea   . DIABETIC  RETINOPATHY   . DIABETES MELLITUS, TYPE I   . DERMATITIS, ALLERGIC   . CHOLELITHIASIS   . CHEST PAIN   . CARPAL TUNNEL SYNDROME, BILATERAL   . ARTHRITIS, RHEUMATOID   . ALLERGIC RHINITIS, SEASONAL   . Acute maxillary sinusitis     Past Surgical History  Procedure Laterality Date  . Coronary artery bypass graft    . Vitrectomy    .  Caesarean section    . Carpal tunnel release    . Cholecystectomy      Current Outpatient Prescriptions  Medication Sig Dispense Refill  . aspirin 81 MG tablet Take 160 mg by mouth daily.      . InFLIXimab (REMICADE IV) Inject into the vein every 8 (eight) weeks.      . Insulin Lispro, Human, (HUMALOG [place]) Inject into the skin.      Marland Kitchen leflunomide (ARAVA) 20 MG tablet Take 20 mg by mouth daily.      Marland Kitchen losartan (COZAAR) 25 MG tablet Take 25 mg by mouth daily.      . methimazole (TAPAZOLE) 5 MG tablet Take 5 mg by mouth daily.      . metoprolol tartrate (LOPRESSOR) 25 MG tablet Take 25 mg by mouth 2 (two) times daily.      . Omega-3 Fatty Acids (FISH OIL) 1200 MG CAPS Take 1,200 mg by mouth 2 (two) times daily.      . rosuvastatin (CRESTOR) 10 MG tablet Take 1 tablet (10 mg total) by mouth daily.  90 tablet  3  . topiramate (TOPAMAX) 25 MG tablet Take 25 mg by mouth 2 (two) times daily.       No current facility-administered medications for this visit.    Allergies  Allergen Reactions  . Amiodarone   . Orencia (  Abatacept)   . Prochlorperazine Edisylate   . Ramipril   . Shellfish Allergy     History   Social History  . Marital Status: Legally Separated    Spouse Name: N/A    Number of Children: N/A  . Years of Education: N/A   Occupational History  . Not on file.   Social History Main Topics  . Smoking status: Never Smoker   . Smokeless tobacco: Not on file  . Alcohol Use: No  . Drug Use: No  . Sexually Active: Not on file   Other Topics Concern  . Not on file   Social History Narrative   Married to [place]. Has 2 kids(fraternal twins) age 27. 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 Drinks diet coke and 4 cups of coffee daily. No exercise.           Family History  Problem Relation Age of Onset  . Diabetes    . Heart disease    . Hypertension    . Hyperlipidemia    . Depression      . Migraines      Review of Systems:  As stated in the HPI and otherwise negative.   BP 125/80  Pulse 64  Ht 5\' 3"  (1.6 m)  Wt 165 lb 6.4 oz (75.025 kg)  BMI 29.31 kg/m2  Physical Examination: General: Well developed, well nourished, NAD HEENT: OP clear, mucus membranes moist SKIN: warm, dry. No rashes. Neuro: No focal deficits Musculoskeletal: Muscle strength 5/5 all ext Psychiatric: Mood and affect normal Neck: No JVD, no carotid bruits, no thyromegaly, no lymphadenopathy. Lungs:Clear bilaterally, no wheezes, rhonci, crackles Cardiovascular: Regular rate and rhythm. No murmurs, gallops or rubs. Abdomen:Soft. Bowel sounds present. Non-tender.  Extremities: No lower extremity edema. Pulses are 2 + in the bilateral DP/PT.  EKG: NSR, rate 62 bpm. Poor R wave progression precordial leads. Subtle ST abnormality lead II, unchanged.   Assessment and Plan:   1. CAD: She is s/p CABG in 2010 with DES in the RCA. She has been doing well until recently. Her chest pain has mostly atypical features, continuous for last 2 weeks. She is a diabetic and had atypical presentation prior to her PCI and CABG in 2010. EKG does not show acute ischemic changes today. Will arrange exercise stress myoview to exclude ischemia. We have discussed the possibility of a repeat cardiac cath but she wishes to start with a stress test. I think this is reasonable. NO medication changes today. We will arrange f/u with Dr. Excell Seltzer.

## 2013-06-15 NOTE — Patient Instructions (Addendum)
Your physician recommends that you schedule a follow-up appointment on July 2 or 3 with Dr. Excell Seltzer  Your physician has requested that you have an exercise stress myoview. For further information please visit [email or web address] Please follow instruction sheet, as given.

## 2013-06-22 ENCOUNTER — Ambulatory Visit (HOSPITAL_COMMUNITY): Payer: No Typology Code available for payment source | Attending: Cardiovascular Disease | Admitting: Radiology

## 2013-06-22 VITALS — BP 116/62 | HR 61 | Ht 63.0 in | Wt 164.0 lb

## 2013-06-22 DIAGNOSIS — R0989 Other specified symptoms and signs involving the circulatory and respiratory systems: Secondary | ICD-10-CM | POA: Insufficient documentation

## 2013-06-22 DIAGNOSIS — I25810 Atherosclerosis of coronary artery bypass graft(s) without angina pectoris: Secondary | ICD-10-CM

## 2013-06-22 DIAGNOSIS — I2510 Atherosclerotic heart disease of native coronary artery without angina pectoris: Secondary | ICD-10-CM

## 2013-06-22 DIAGNOSIS — R079 Chest pain, unspecified: Secondary | ICD-10-CM | POA: Insufficient documentation

## 2013-06-22 DIAGNOSIS — R5381 Other malaise: Secondary | ICD-10-CM | POA: Insufficient documentation

## 2013-06-22 DIAGNOSIS — R110 Nausea: Secondary | ICD-10-CM | POA: Insufficient documentation

## 2013-06-22 DIAGNOSIS — R42 Dizziness and giddiness: Secondary | ICD-10-CM | POA: Insufficient documentation

## 2013-06-22 DIAGNOSIS — R0609 Other forms of dyspnea: Secondary | ICD-10-CM | POA: Insufficient documentation

## 2013-06-22 MED ORDER — TECHNETIUM TC 99M SESTAMIBI GENERIC - CARDIOLITE
10.0000 | Freq: Once | INTRAVENOUS | Status: AC | PRN
  Administered 2013-06-22: 10 via INTRAVENOUS

## 2013-06-22 MED ORDER — TECHNETIUM TC 99M SESTAMIBI GENERIC - CARDIOLITE
30.0000 | Freq: Once | INTRAVENOUS | Status: AC | PRN
  Administered 2013-06-22: 30 via INTRAVENOUS

## 2013-06-22 NOTE — Progress Notes (Signed)
  [facility] SITE 3 NUCLEAR MED [address] [place], [place] [address] [phone]    Cardiology Nuclear Med Study  [name] is a 47 y.o. female     MRN : [id number]     DOB: 09-07-1966  Procedure Date: 06/22/2013  Nuclear Med Background Indication for Stress Test:  Evaluation for Ischemia and Graft Patency History:  3/10 IWMI > RCA Stent; 5/10 Heart Cath, patent stent; 6/10 CABG x3; 7/11 Echo: EF=60-65% Cardiac Risk Factors: Hypertension, IDDM Type 1 and Lipids  Symptoms:  Chest Pain, Chest Pressure.  (last date of chest discomfort last night), Dizziness, DOE, Fatigue, Nausea and SOB   Nuclear Pre-Procedure Caffeine/Decaff Intake:  7:00pm NPO After: 9:00pm   Lungs:  clear O2 Sat: 98% on room air. IV 0.9% NS with Angio Cath:  22g  IV Site: R Wrist  IV Started by:  Cathlyn Parsons, RN  Chest Size (in):  38 Cup Size: C  Height: 5\' 3"  (1.6 m)  Weight:  164 lb (74.39 kg)  BMI:  Body mass index is 29.06 kg/(m^2). Tech Comments:  Lopressor held x 24 hrs    Nuclear Med Study 1 or 2 day study: 1 day  Stress Test Type:  Stress  Reading MD: Charlton Haws, MD  Order Authorizing Provider:  Casimiro Needle Cooper,MD  Resting Radionuclide: Technetium 32m Sestamibi  Resting Radionuclide Dose: 11.0 mCi   Stress Radionuclide:  Technetium 84m Sestamibi  Stress Radionuclide Dose: 33.0 mCi           Stress Protocol Rest HR: 61 Stress HR: 164  Rest BP: 116/62 Stress BP: 163/69  Exercise Time (min): 8:30 METS: 10.1   Predicted Max HR: 175 bpm % Max HR: 93.71 bpm     Dose of Adenosine (mg):  n/a Dose of Lexiscan: 0.4 mg  Dose of Atropine (mg): n/a Dose of Dobutamine: n/a mcg/kg/min (at max HR)  Stress Test Technologist: Stanton Kidney, EMT-P  Nuclear Technologist:  Domenic Polite, CNMT     Rest Procedure:  Myocardial perfusion imaging was performed at rest 45 minutes following the intravenous administration of Technetium 62m Sestamibi. Rest ECG: Low voltage NSR  RSR'  Stress Procedure:  The patient exercised on the treadmill utilizing the Bruce Protocol for 8:30 minutes. The patient stopped due to Chest pain and fatigue.  Chest pain reached 4 out of 10 scale. Resolved to pain free with rest. Technetium 11m Sestamibi was injected at peak exercise and myocardial perfusion imaging was performed after a brief delay. Stress ECG: No significant change from baseline ECG  QPS Raw Data Images:  Normal; no motion artifact; normal heart/lung ratio. Stress Images:  Normal homogeneous uptake in all areas of the myocardium. Rest Images:  Normal homogeneous uptake in all areas of the myocardium. Subtraction (SDS):  Normal Transient Ischemic Dilatation (Normal <1.22):  0.84 Lung/Heart Ratio (Normal <0.45):  0.35  Quantitative Gated Spect Images QGS EDV:  51 ml QGS ESV:  10 ml  Impression Exercise Capacity:  Fair exercise capacity. BP Response:  Normal blood pressure response. Clinical Symptoms:  Mild chest pain and dyspnea ECG Impression:  No significant ST segment change suggestive of ischemia. Comparison with Prior Nuclear Study: No images to compare  Overall Impression:  Normal stress nuclear study.  LV Ejection Fraction: 81%.  LV Wall Motion:  NL LV Function; NL Wall Motion   Charlton Haws

## 2013-07-01 ENCOUNTER — Encounter: Payer: Self-pay | Admitting: Cardiovascular Disease

## 2013-07-01 ENCOUNTER — Ambulatory Visit (INDEPENDENT_AMBULATORY_CARE_PROVIDER_SITE_OTHER): Payer: No Typology Code available for payment source | Admitting: Cardiovascular Disease

## 2013-07-01 VITALS — BP 130/74 | HR 73 | Ht 63.0 in | Wt 166.0 lb

## 2013-07-01 DIAGNOSIS — I2510 Atherosclerotic heart disease of native coronary artery without angina pectoris: Secondary | ICD-10-CM

## 2013-07-01 NOTE — Patient Instructions (Addendum)
Your physician wants you to follow-up in: 1 YEAR with Dr Cooper.  You will receive a reminder letter in the mail two months in advance. If you don't receive a letter, please call our office to schedule the follow-up appointment.  Your physician recommends that you continue on your current medications as directed. Please refer to the Current Medication list given to you today.  

## 2013-07-02 ENCOUNTER — Encounter: Payer: Self-pay | Admitting: Cardiovascular Disease

## 2013-07-02 NOTE — Progress Notes (Signed)
HPI:  47 year old woman presenting for followup evaluation. She has premature coronary artery disease and underwent CABG in 2010. She initially presented with an inferior wall MI and was treated with stenting of the right coronary artery. However her LAD was totally occluded and she was ultimately treated with multivessel CABG. She's had no further ischemic events. She was seen 2 weeks ago by Dr. Clifton James because of recurrent chest pain. Her symptoms were different than that of her myocardial infarction, but remain concerning considering her aggressive premature CAD and background of type 1 diabetes.  She underwent a nuclear stress scan demonstrating no significant ischemia. She presents today for followup. She complains of fatigue, leg swelling, and nonexertional left-sided chest discomfort. She's been unhappy with her medical care in [place], and plans to continue with our practice.   Outpatient Encounter Prescriptions as of 07/01/2013  Medication Sig Dispense Refill  . aspirin 81 MG tablet Take 160 mg by mouth daily.      . InFLIXimab (REMICADE IV) Inject into the vein every 8 (eight) weeks.      . Insulin Lispro, Human, (HUMALOG [place]) Inject into the skin.      Marland Kitchen leflunomide (ARAVA) 20 MG tablet Take 20 mg by mouth daily.      Marland Kitchen losartan (COZAAR) 25 MG tablet Take 25 mg by mouth daily.      . methimazole (TAPAZOLE) 5 MG tablet Take 5 mg by mouth daily.      . metoprolol tartrate (LOPRESSOR) 25 MG tablet Take 25 mg by mouth 2 (two) times daily.      . Omega-3 Fatty Acids (FISH OIL) 1200 MG CAPS Take 1,200 mg by mouth 2 (two) times daily.      . rosuvastatin (CRESTOR) 10 MG tablet Take 1 tablet (10 mg total) by mouth daily.  90 tablet  3  . topiramate (TOPAMAX) 25 MG tablet Take 25 mg by mouth 2 (two) times daily.       No facility-administered encounter medications on file as of 07/01/2013.    Allergies  Allergen Reactions  . Amiodarone   . Orencia (Abatacept)   . Prochlorperazine  Edisylate   . Ramipril   . Shellfish Allergy     Past Medical History  Diagnosis Date  . SINUS TACHYCARDIA 11/08/2010  . MIGRAINE W/O AURA W/INTRACT W/STATUS MIGRAINOSUS 02/19/2008  . HYPERLIPIDEMIA-MIXED   . CAD, ARTERY BYPASS GRAFT   . Atrial fibrillation   . ANGINA, STABLE/EXERTIONAL   . ACUT MI ANTEROLAT WALL SUBSQT EPIS CARE   . URI   . TRIGGER FINGER   . Shortness of breath   . HYPERTHYROIDISM   . Edema   . DIZZINESS   . Diarrhea   . DIABETIC  RETINOPATHY   . DIABETES MELLITUS, TYPE I   . DERMATITIS, ALLERGIC   . CHOLELITHIASIS   . CHEST PAIN   . CARPAL TUNNEL SYNDROME, BILATERAL   . ARTHRITIS, RHEUMATOID   . ALLERGIC RHINITIS, SEASONAL   . Acute maxillary sinusitis     ROS: Negative except as per HPI  BP 130/74  Pulse 73  Ht 5\' 3"  (1.6 m)  Wt 166 lb (75.297 kg)  BMI 29.41 kg/m2  PHYSICAL EXAM: Pt is alert and oriented, NAD HEENT: normal Neck: JVP - normal, carotids 2+= without bruits Lungs: CTA bilaterally CV: RRR without murmur or gallop Abd: soft, NT, Positive BS, no hepatomegaly Ext: no C/C/E, distal pulses intact and equal Skin: warm/dry no rash  Myoview scan 06/22/2013: QPS  Raw Data  Images: Normal; no motion artifact; normal heart/lung ratio.  Stress Images: Normal homogeneous uptake in all areas of the myocardium.  Rest Images: Normal homogeneous uptake in all areas of the myocardium.  Subtraction (SDS): Normal  Transient Ischemic Dilatation (Normal <1.22): 0.84  Lung/Heart Ratio (Normal <0.45): 0.35  Quantitative Gated Spect Images  QGS EDV: 51 ml  QGS ESV: 10 ml  Impression  Exercise Capacity: Fair exercise capacity.  BP Response: Normal blood pressure response.  Clinical Symptoms: Mild chest pain and dyspnea  ECG Impression: No significant ST segment change suggestive of ischemia.  Comparison with Prior Nuclear Study: No images to compare  Overall Impression: Normal stress nuclear study.  LV Ejection Fraction: 81%. LV Wall Motion: NL  LV Function; NL Wall Motion   ASSESSMENT AND PLAN: 1. Coronary artery disease, native vessel. The patient is status post initial PCI and definitive CABG. Her stress nuclear scan is reassuring with normal left ventricular function and normal myocardial perfusion. Will continue her same medical program. Her chest pain symptoms are somewhat atypical and I suspect noncardiac.  2. Hyperlipidemia. Patient is on Crestor 10 mg. Labs have been followed regularly by her primary care physician. I believe statin dose is limited by side effects.  3. Hypertension. Blood pressure control on a combination of metoprolol and losartan.  I will see her back in 12 months unless problems arise.  Tonny Bollman 07/02/2013 5:31 AM

## 2013-11-05 ENCOUNTER — Other Ambulatory Visit: Payer: Self-pay

## 2014-01-15 DIAGNOSIS — M2550 Pain in unspecified joint: Secondary | ICD-10-CM | POA: Insufficient documentation

## 2014-01-15 DIAGNOSIS — G894 Chronic pain syndrome: Secondary | ICD-10-CM | POA: Insufficient documentation

## 2014-05-06 ENCOUNTER — Encounter: Payer: Self-pay | Admitting: Cardiovascular Disease

## 2014-05-06 NOTE — Telephone Encounter (Signed)
Left message on machine for pt to contact the office.   

## 2014-05-06 NOTE — Telephone Encounter (Signed)
New Prob   Pt called per recall letter for follow up in July. Pt does not feel comfortable waiting until first available for her follow up and would like to speak to a nurse.

## 2014-05-07 NOTE — Telephone Encounter (Signed)
Left message on machine for pt to contact the office.   

## 2014-05-12 NOTE — Telephone Encounter (Signed)
This encounter was created in error - please disregard.

## 2014-05-12 NOTE — Telephone Encounter (Signed)
Follow up:  Pt is requesting a call back from [employer]... States she should be available after 2 pm if [name] could call her then.Lisa KitchenMarland KitchenMarland [name]

## 2014-06-02 ENCOUNTER — Encounter: Payer: Self-pay | Admitting: Cardiovascular Disease

## 2014-06-02 ENCOUNTER — Ambulatory Visit (INDEPENDENT_AMBULATORY_CARE_PROVIDER_SITE_OTHER): Payer: No Typology Code available for payment source | Admitting: Cardiovascular Disease

## 2014-06-02 VITALS — BP 125/78 | HR 76 | Ht 63.0 in | Wt 169.0 lb

## 2014-06-02 DIAGNOSIS — R609 Edema, unspecified: Secondary | ICD-10-CM

## 2014-06-02 DIAGNOSIS — I10 Essential (primary) hypertension: Secondary | ICD-10-CM

## 2014-06-02 DIAGNOSIS — I25810 Atherosclerosis of coronary artery bypass graft(s) without angina pectoris: Secondary | ICD-10-CM

## 2014-06-02 MED ORDER — POTASSIUM CHLORIDE ER 10 MEQ PO TBCR: 10.0000 meq | EXTENDED_RELEASE_TABLET | Freq: Every day | ORAL | Status: DC

## 2014-06-02 MED ORDER — FUROSEMIDE 40 MG PO TABS: 40.0000 mg | ORAL_TABLET | Freq: Every day | ORAL | Status: DC

## 2014-06-02 NOTE — Progress Notes (Signed)
HPI:  48 year old woman presenting for followup evaluation. She has premature coronary artery disease and underwent CABG in 2010. She initially presented with an inferior wall MI and was treated with stenting of the right coronary artery. However her LAD was totally occluded and she was ultimately treated with multivessel CABG. She's had no further ischemic events. She was seen last year with recurrence of chest pain. A stress nuclear scan showed no significant ischemia.  The patient has had no problems with chest pain. She denies orthopnea or PND. She has developed leg swelling. She is taking metolazone as needed. However, she has symptomatic hypotension when she takes this along with Lasix. Daily Lasix has been somewhat ineffective over short periods of time.  The patient has also developed more problems with connective tissue disease. She is now thought to have psoriatic arthritis. She has started taking Rituxan.   Outpatient Encounter Prescriptions as of 06/02/2014  Medication Sig  . aspirin 81 MG tablet Take 160 mg by mouth daily.  . Insulin Lispro, Human, (HUMALOG [place]) Inject into the skin.  Marland Kitchen leflunomide (ARAVA) 20 MG tablet Take 20 mg by mouth daily.  Marland Kitchen losartan (COZAAR) 50 MG tablet Take 50 mg by mouth daily.  . methimazole (TAPAZOLE) 5 MG tablet Take 5 mg by mouth daily. Taking one half tablet daily  . metoprolol tartrate (LOPRESSOR) 25 MG tablet Take 25 mg by mouth 2 (two) times daily.  . Omega-3 Fatty Acids (FISH OIL) 1200 MG CAPS Take 1,200 mg by mouth 2 (two) times daily.  . RiTUXimab (RITUXAN IV) Inject into the vein. Patient does not know dose she is taking  . rosuvastatin (CRESTOR) 10 MG tablet Take 1 tablet (10 mg total) by mouth daily.  Marland Kitchen topiramate (TOPAMAX) 50 MG tablet Take 50 mg by mouth 2 (two) times daily.  . [DISCONTINUED] InFLIXimab (REMICADE IV) Inject into the vein every 8 (eight) weeks.  . [DISCONTINUED] losartan (COZAAR) 25 MG tablet Take 50 mg by mouth daily.     . [DISCONTINUED] methimazole (TAPAZOLE) 5 MG tablet Take 5 mg by mouth daily.  . [DISCONTINUED] topiramate (TOPAMAX) 25 MG tablet Take 50 mg by mouth 2 (two) times daily.     Allergies  Allergen Reactions  . Amiodarone   . Orencia [Abatacept]   . Prochlorperazine Edisylate   . Ramipril   . Shellfish Allergy     Past Medical History  Diagnosis Date  . SINUS TACHYCARDIA 11/08/2010  . MIGRAINE W/O AURA W/INTRACT W/STATUS MIGRAINOSUS 02/19/2008  . HYPERLIPIDEMIA-MIXED   . CAD, ARTERY BYPASS GRAFT   . Atrial fibrillation   . ANGINA, STABLE/EXERTIONAL   . ACUT MI ANTEROLAT WALL SUBSQT EPIS CARE   . URI   . TRIGGER FINGER   . Shortness of breath   . HYPERTHYROIDISM   . Edema   . DIZZINESS   . Diarrhea   . DIABETIC  RETINOPATHY   . DIABETES MELLITUS, TYPE I   . DERMATITIS, ALLERGIC   . CHOLELITHIASIS   . CHEST PAIN   . CARPAL TUNNEL SYNDROME, BILATERAL   . ARTHRITIS, RHEUMATOID   . ALLERGIC RHINITIS, SEASONAL   . Acute maxillary sinusitis    ROS: Positive for skin rash and joint pains, otherwise Negative except as per HPI  BP 125/78  Pulse 76  Ht 5\' 3"  (1.6 m)  Wt 76.658 kg (169 lb)  BMI 29.94 kg/m2  PHYSICAL EXAM: Pt is alert and oriented, NAD HEENT: normal Neck: JVP - normal, carotids 2+= without bruits  Lungs: CTA bilaterally CV: RRR without murmur or gallop Abd: soft, NT, Positive BS, no hepatomegaly Ext: 1+ bilateral pretibial edema, distal pulses intact and equal Skin: warm/dry no rash  EKG:  Normal sinus rhythm 76 beats per minute, right atrial enlargement.  ASSESSMENT AND PLAN: 1. Coronary artery disease, native vessel. The patient is stable without symptoms of angina. Will continue aspirin, metoprolol, and rosuvastatin.  2. Hyperlipidemia. She brings in recent lipids with a cholesterol of 153, LDL 74, HDL 63, introducer is 110. Continue rosuvastatin.  3. Leg swelling. Possibility of diastolic congestive heart failure reviewed with the patient. Other  considerations include venous insufficiency, medication related, or edema related to her inflammatory arthritis. Recommend initiation of furosemide 40 mg daily and K-Dur 10 milliequivalents daily. Will check a 2-D echocardiogram to evaluate LV systolic and diastolic function.  For followup I will see her back in 6 months.  Sherren Mocha 06/02/2014 5:02 PM

## 2014-06-02 NOTE — Patient Instructions (Signed)
Your physician has recommended you make the following change in your medication:  START Furosemide 40mg  take one by mouth daily START KCl 85mEq take one by mouth daily  Your physician has requested that you have an echocardiogram. Echocardiography is a painless test that uses sound waves to create images of your heart. It provides your doctor with information about the size and shape of your heart and how well your heart's chambers and valves are working. This procedure takes approximately one hour. There are no restrictions for this procedure.  Your physician wants you to follow-up in: 6 MONTHS with Dr Burt Knack.  You will receive a reminder letter in the mail two months in advance. If you don't receive a letter, please call our office to schedule the follow-up appointment.

## 2014-06-10 ENCOUNTER — Ambulatory Visit (HOSPITAL_COMMUNITY)
Admission: RE | Admit: 2014-06-10 | Discharge: 2014-06-10 | Disposition: A | Discharge status: Home / Self Care | Payer: No Typology Code available for payment source | Source: Ambulatory Visit | Attending: Cardiology | Admitting: Cardiology

## 2014-06-10 DIAGNOSIS — R609 Edema, unspecified: Secondary | ICD-10-CM

## 2014-06-10 DIAGNOSIS — I2510 Atherosclerotic heart disease of native coronary artery without angina pectoris: Secondary | ICD-10-CM

## 2014-06-10 DIAGNOSIS — I25810 Atherosclerosis of coronary artery bypass graft(s) without angina pectoris: Secondary | ICD-10-CM

## 2014-06-10 DIAGNOSIS — I10 Essential (primary) hypertension: Secondary | ICD-10-CM

## 2014-06-16 ENCOUNTER — Encounter: Payer: Self-pay | Admitting: Cardiovascular Disease

## 2014-06-16 NOTE — Telephone Encounter (Signed)
Left message to call back  

## 2014-06-16 NOTE — Telephone Encounter (Signed)
New problem   Pt returning your call concerning results. Please call pt.

## 2014-06-17 NOTE — Telephone Encounter (Signed)
This encounter was created in error - please disregard.

## 2014-08-30 DIAGNOSIS — M79671 Pain in right foot: Secondary | ICD-10-CM | POA: Insufficient documentation

## 2014-08-30 DIAGNOSIS — M25511 Pain in right shoulder: Secondary | ICD-10-CM | POA: Insufficient documentation

## 2015-03-30 ENCOUNTER — Ambulatory Visit (INDEPENDENT_AMBULATORY_CARE_PROVIDER_SITE_OTHER): Payer: No Typology Code available for payment source | Admitting: Cardiovascular Disease

## 2015-03-30 ENCOUNTER — Encounter: Payer: Self-pay | Admitting: Cardiovascular Disease

## 2015-03-30 VITALS — BP 128/78 | HR 74 | Ht 63.0 in | Wt 167.0 lb

## 2015-03-30 DIAGNOSIS — I4891 Unspecified atrial fibrillation: Secondary | ICD-10-CM | POA: Diagnosis not present

## 2015-03-30 DIAGNOSIS — I2109 ST elevation (STEMI) myocardial infarction involving other coronary artery of anterior wall: Secondary | ICD-10-CM | POA: Diagnosis not present

## 2015-03-30 DIAGNOSIS — E785 Hyperlipidemia, unspecified: Secondary | ICD-10-CM

## 2015-03-30 DIAGNOSIS — R609 Edema, unspecified: Secondary | ICD-10-CM

## 2015-03-30 DIAGNOSIS — I10 Essential (primary) hypertension: Secondary | ICD-10-CM

## 2015-03-30 MED ORDER — LOSARTAN POTASSIUM 50 MG PO TABS: 50.0000 mg | ORAL_TABLET | Freq: Every day | ORAL | Status: DC

## 2015-03-30 MED ORDER — FUROSEMIDE 40 MG PO TABS: 40.0000 mg | ORAL_TABLET | Freq: Every day | ORAL | Status: DC

## 2015-03-30 MED ORDER — METOPROLOL TARTRATE 25 MG PO TABS: 25.0000 mg | ORAL_TABLET | Freq: Two times a day (BID) | ORAL | Status: DC

## 2015-03-30 MED ORDER — ROSUVASTATIN CALCIUM 10 MG PO TABS: 10.0000 mg | ORAL_TABLET | Freq: Every day | ORAL | Status: DC

## 2015-03-30 MED ORDER — POTASSIUM CHLORIDE ER 10 MEQ PO TBCR: 10.0000 meq | EXTENDED_RELEASE_TABLET | Freq: Every day | ORAL | Status: DC

## 2015-03-30 NOTE — Progress Notes (Signed)
Cardiology Office Note   Date:  03/30/2015   ID:  [name], [name] 01-22-1966, MRN [id number]  PCP:  Dell Ponto, DO  Cardiologist:  Sherren Mocha, MD    Chief Complaint  Patient presents with  . Fatigue    History of Present Illness: [name] is a 49 y.o. female who presents for follow-up evaluation. She has premature coronary artery disease and underwent CABG in 2010. She initially presented with an inferior wall MI and was treated with stenting of the right coronary artery. However her LAD was totally occluded and she was ultimately treated with multivessel CABG. She's had no further ischemic events. She was seen last year with recurrence of chest pain. A stress nuclear scan showed no significant ischemia.  The patient complains of generalized fatigue. This is long-standing and unchanged. She denies chest pain or pressure. She's had mild leg swelling but no major problems and no change with this. She did require metolazone for some time but no longer takes this. She does take furosemide 40 mg daily. She denies orthopnea, PND, lightheadedness, or syncope. She has mild exertional dyspnea.  Past Medical History  Diagnosis Date  . SINUS TACHYCARDIA 11/08/2010  . MIGRAINE W/O AURA W/INTRACT W/STATUS MIGRAINOSUS 02/19/2008  . HYPERLIPIDEMIA-MIXED   . CAD, ARTERY BYPASS GRAFT   . Atrial fibrillation   . ANGINA, STABLE/EXERTIONAL   . ACUT MI ANTEROLAT WALL SUBSQT EPIS CARE   . URI   . TRIGGER FINGER   . Shortness of breath   . HYPERTHYROIDISM   . Edema   . DIZZINESS   . Diarrhea   . DIABETIC  RETINOPATHY   . DIABETES MELLITUS, TYPE I   . DERMATITIS, ALLERGIC   . CHOLELITHIASIS   . CHEST PAIN   . CARPAL TUNNEL SYNDROME, BILATERAL   . ARTHRITIS, RHEUMATOID   . ALLERGIC RHINITIS, SEASONAL   . Acute maxillary sinusitis     Past Surgical History  Procedure Laterality Date  . Coronary artery bypass graft    . Vitrectomy    . Caesarean section    . Carpal tunnel  release    . Cholecystectomy      Current Outpatient Prescriptions  Medication Sig Dispense Refill  . Adalimumab 40 MG/0.8ML PSKT Inject 0.8 mLs into the skin every 14 (fourteen) days.     Marland Kitchen aspirin 81 MG tablet Take 160 mg by mouth daily.    . Calcium Carbonate-Vitamin D (OYSTER SHELL CALCIUM/D) 250-125 MG-UNIT TABS Take 125 mg by mouth daily.     . clobetasol ointment (TEMOVATE) 0.86 % Apply 1 application topically 2 (two) times daily. Apply to worst areas 1-2 times a day    . cyclobenzaprine (FLEXERIL) 10 MG tablet Take 10 mg by mouth 3 (three) times daily as needed. Take 1 TID prn spasms    . EPINEPHrine 0.15 MG/0.15ML SOAJ Inject 0.15 mg into the skin as needed. Inject as directed.    . furosemide (LASIX) 40 MG tablet Take 1 tablet (40 mg total) by mouth daily. 90 tablet 3  . insulin lispro (HUMALOG) 100 UNIT/ML injection FOR USE IN INSULIN PUMP. TOTAL DAILY INSULIN DOSE = UP TO 90 UNITS.    Marland Kitchen Insulin Lispro, Human, (HUMALOG [place]) Inject 60 Units into the skin every morning.     . leflunomide (ARAVA) 20 MG tablet Take 20 mg by mouth daily.    Marland Kitchen losartan (COZAAR) 50 MG tablet Take 1 tablet (50 mg total) by mouth daily. 90 tablet 3  .  methimazole (TAPAZOLE) 5 MG tablet Take 5 mg by mouth daily. Taking one half tablet daily    . metoprolol tartrate (LOPRESSOR) 25 MG tablet Take 1 tablet (25 mg total) by mouth 2 (two) times daily. 180 tablet 3  . nabumetone (RELAFEN) 500 MG tablet Take 500 mg by mouth 2 (two) times daily.     . Omega-3 Fatty Acids (FISH OIL) 1200 MG CAPS Take 1,200 mg by mouth 2 (two) times daily.    . potassium chloride (K-DUR) 10 MEQ tablet Take 1 tablet (10 mEq total) by mouth daily. 90 tablet 3  . RiTUXimab (RITUXAN IV) Inject into the vein. Patient does not know dose she is taking    . rosuvastatin (CRESTOR) 10 MG tablet Take 1 tablet (10 mg total) by mouth daily. 90 tablet 3  . topiramate (TOPAMAX) 50 MG tablet Take 50 mg by mouth 2 (two) times daily.    . vitamin C  (ASCORBIC ACID) 500 MG tablet Take 500 mg by mouth daily.      No current facility-administered medications for this visit.    Allergies:   Prochlorperazine; Ramipril; Shellfish-derived products; Atorvastatin; Etanercept; Infliximab; Orencia; Shellfish allergy; Tofacitinib; Prochlorperazine edisylate; Amiodarone; and Rituximab   Social History:  The patient  reports that she has never smoked. She does not have any smokeless tobacco history on file. She reports that she does not drink alcohol or use illicit drugs.   Family History:  The patient's  family history includes Depression in an other family member; Diabetes in an other family member; Heart disease in an other family member; Hyperlipidemia in an other family member; Hypertension in an other family member; Migraines in an other family member; Stroke in her paternal grandmother. There is no history of Heart attack.    ROS:  Please see the history of present illness.  Otherwise, review of systems is positive for leg swelling, cough, shortness of breath with exertion, back pain, rash, fatigue.  All other systems are reviewed and negative.    PHYSICAL EXAM: VS:  BP 128/78 mmHg  Pulse 74  Ht 5\' 3"  (1.6 m)  Wt 167 lb (75.751 kg)  BMI 29.59 kg/m2 , BMI Body mass index is 29.59 kg/(m^2). GEN: Well nourished, well developed, in no acute distress HEENT: normal Neck: no JVD, no masses. No carotid bruits Cardiac: RRR without murmur or gallop                Respiratory:  clear to auscultation bilaterally, normal work of breathing GI: soft, nontender, nondistended, + BS MS: no deformity or atrophy Ext: trace pretibial edema, pedal pulses 2+= bilaterally Skin: warm and dry, no rash Neuro:  Strength and sensation are intact Psych: euthymic mood, full affect  EKG:  EKG is ordered today. The ekg ordered today shows normal sinus rhythm 74 bpm, RV conduction delay, low-voltage QRS, otherwise within normal limits  Recent Labs: No results found  for requested labs within last 365 days.   Lipid Panel     Component Value Date/Time   CHOL 117 02/21/2010 0950   TRIG 42.0 02/21/2010 0950   HDL 59.80 02/21/2010 0950   CHOLHDL 2 02/21/2010 0950   VLDL 8.4 02/21/2010 0950   LDLCALC 49 02/21/2010 0950      Wt Readings from Last 3 Encounters:  03/30/15 167 lb (75.751 kg)  06/02/14 169 lb (76.658 kg)  07/01/13 166 lb (75.297 kg)     Cardiac Studies Reviewed: 2D Echo 06/10/2014: Left ventricle: The cavity size was normal.  Wall thickness was normal. Systolic function was vigorous. The estimated ejection fraction was in the range of 65% to 70%.  ------------------------------------------------------------------- Aortic valve:  Mildly thickened leaflets. Doppler: There was trivial regurgitation.  ------------------------------------------------------------------- Mitral valve:  Structurally normal valve.  Leaflet separation was normal. Doppler: Transvalvular velocity was within the normal range. There was no evidence for stenosis. There was trivial regurgitation.  Peak gradient (D): 2 mm Hg.  ------------------------------------------------------------------- Left atrium: LA Volume/ BSA = 16.1 ml/m2. The atrium was normal in size.  ------------------------------------------------------------------- Right ventricle: The cavity size was normal. Wall thickness was normal. Systolic function was normal.  ------------------------------------------------------------------- Tricuspid valve:  Structurally normal valve.  Leaflet separation was normal. Doppler: Transvalvular velocity was within the normal range. There was trivial regurgitation.  ------------------------------------------------------------------- Right atrium: The atrium was normal in size.  ------------------------------------------------------------------- Pericardium: There was no pericardial effusion.  ASSESSMENT AND PLAN: 1.  Coronary  artery disease, native vessel: The patient is stable without symptoms of angina. She has undergone coronary bypass surgery. She'll continue on her current medical program without changes. Her medications were reviewed in detail today.  2. Essential hypertension: Blood pressure is well controlled on losartan and metoprolol  3. Hyperlipidemia: She continues on Crestor 10 mg daily. Lipids are followed by her primary care physician.  4. Edema: With associated shortness of breath, an echocardiogram was checked last year. This was essentially within normal limits. She continues on furosemide 40 mg daily. Advised to avoid metolazone because of concern about renal dysfunction and hypokalemia.   Current medicines are reviewed with the patient today.  The patient does not have concerns regarding medicines.  The following changes have been made:  no change  Labs/ tests ordered today include:   Orders Placed This Encounter  Procedures  . EKG 12-Lead    Disposition:   FU one year  Signed, Sherren Mocha, MD  03/30/2015 10:20 PM    [facility] [address], [place], [place]  [address] Phone: [phone]; Fax: [phone]

## 2015-03-30 NOTE — Patient Instructions (Signed)
Your physician recommends that you continue on your current medications as directed. Please refer to the Current Medication list given to you today.  Your physician wants you to follow-up in: 1 year with Dr. Cooper.  You will receive a reminder letter in the mail two months in advance. If you don't receive a letter, please call our office to schedule the follow-up appointment.   

## 2015-05-31 ENCOUNTER — Other Ambulatory Visit: Payer: Self-pay | Admitting: Cardiovascular Disease

## 2015-06-27 ENCOUNTER — Other Ambulatory Visit: Payer: Self-pay

## 2015-07-05 ENCOUNTER — Other Ambulatory Visit: Payer: Self-pay | Admitting: Cardiovascular Disease

## 2016-02-13 MED FILL — SIMPONI 100 MG/ML SOAJ: 100 | 30 days supply | Qty: 1 | Fill #0

## 2016-02-27 ENCOUNTER — Encounter: Payer: Self-pay | Admitting: *Deleted

## 2016-02-27 ENCOUNTER — Emergency Department
Admission: EM | Admit: 2016-02-27 | Discharge: 2016-02-27 | Disposition: A | Discharge status: Home / Self Care | Payer: 59 | Source: Home / Self Care | Attending: Family Medicine | Admitting: Family Medicine

## 2016-02-27 DIAGNOSIS — J069 Acute upper respiratory infection, unspecified: Secondary | ICD-10-CM | POA: Diagnosis not present

## 2016-02-27 DIAGNOSIS — B9789 Other viral agents as the cause of diseases classified elsewhere: Principal | ICD-10-CM

## 2016-02-27 MED ORDER — AMOXICILLIN 875 MG PO TABS: 875.0000 mg | ORAL_TABLET | Freq: Two times a day (BID) | ORAL | Status: DC

## 2016-02-27 MED ORDER — BENZONATATE 200 MG PO CAPS: 200.0000 mg | ORAL_CAPSULE | Freq: Every day | ORAL | Status: DC

## 2016-02-27 NOTE — ED Provider Notes (Signed)
CSN: [id number]     Arrival date & time 02/27/16  [id number] History   First MD Initiated Contact with Patient 02/27/16 [phone]     Chief Complaint  Patient presents with  . Cough  . Eye Drainage      HPI Comments: Patient complains of five day history of typical cold-like symptoms developing over several days, including mild sore throat, sinus congestion, headache, fatigue, and cough.   The history is provided by the patient.    Past Medical History  Diagnosis Date  . SINUS TACHYCARDIA 11/08/2010  . MIGRAINE W/O AURA W/INTRACT W/STATUS MIGRAINOSUS 02/19/2008  . HYPERLIPIDEMIA-MIXED   . CAD, ARTERY BYPASS GRAFT   . Atrial fibrillation ([place])   . ANGINA, STABLE/EXERTIONAL   . ACUT MI ANTEROLAT WALL SUBSQT EPIS CARE   . URI   . TRIGGER FINGER   . Shortness of breath   . HYPERTHYROIDISM   . Edema   . DIZZINESS   . Diarrhea   . DIABETIC  RETINOPATHY   . DIABETES MELLITUS, TYPE I   . DERMATITIS, ALLERGIC   . CHOLELITHIASIS   . CHEST PAIN   . CARPAL TUNNEL SYNDROME, BILATERAL   . ARTHRITIS, RHEUMATOID   . ALLERGIC RHINITIS, SEASONAL   . Acute maxillary sinusitis    Past Surgical History  Procedure Laterality Date  . Coronary artery bypass graft    . Vitrectomy    . Caesarean section    . Carpal tunnel release    . Cholecystectomy     Family History  Problem Relation Age of Onset  . Diabetes    . Heart disease    . Hypertension    . Hyperlipidemia    . Depression    . Migraines    . Heart attack Neg Hx   . Stroke Paternal Grandmother    Social History  Substance Use Topics  . Smoking status: Never Smoker   . Smokeless tobacco: None  . Alcohol Use: No   OB History    No data available     Review of Systems + sore throat + cough No pleuritic pain No wheezing + nasal congestion + post-nasal drainage No sinus pain/pressure + itchy/red eyes ? earache No hemoptysis + SOB No fever, + chills/sweats No nausea No vomiting No abdominal pain No diarrhea No  urinary symptoms No skin rash + fatigue + myalgias + headache Used OTC meds without relief  Allergies  Prochlorperazine; Ramipril; Shellfish-derived products; Atorvastatin; Etanercept; Infliximab; Orencia; Shellfish allergy; Tofacitinib; Prochlorperazine edisylate; Amiodarone; and Rituximab  Home Medications   Prior to Admission medications   Medication Sig Start Date End Date Taking? Authorizing Provider  Adalimumab 40 MG/0.8ML PSKT Inject 0.8 mLs into the skin every 14 (fourteen) days.     Historical Provider, MD  amoxicillin (AMOXIL) 875 MG tablet Take 1 tablet (875 mg total) by mouth 2 (two) times daily. (Rx void after 03/07/16) 02/27/16   Kandra Nicolas, MD  aspirin 81 MG tablet Take 160 mg by mouth daily.    Historical Provider, MD  benzonatate (TESSALON) 200 MG capsule Take 1 capsule (200 mg total) by mouth at bedtime. Take as needed for cough 02/27/16   Kandra Nicolas, MD  Calcium Carbonate-Vitamin D (OYSTER SHELL CALCIUM/D) 250-125 MG-UNIT TABS Take 125 mg by mouth daily.     Historical Provider, MD  clobetasol ointment (TEMOVATE) [id number] % Apply 1 application topically 2 (two) times daily. Apply to worst areas 1-2 times a day 02/22/15   Historical Provider, MD  cyclobenzaprine (FLEXERIL) 10 MG tablet Take 10 mg by mouth 3 (three) times daily as needed. Take 1 TID prn spasms 10/04/14   Historical Provider, MD  EPINEPHrine 0.15 MG/0.15ML SOAJ Inject 0.15 mg into the skin as needed. Inject as directed.    Historical Provider, MD  furosemide (LASIX) 40 MG tablet Take 1 tablet (40 mg total) by mouth daily. 03/30/15   Sherren Mocha, MD  insulin lispro (HUMALOG) 100 UNIT/ML injection FOR USE IN INSULIN PUMP. TOTAL DAILY INSULIN DOSE = UP TO 90 UNITS. 03/24/15   Historical Provider, MD  Insulin Lispro, Human, (HUMALOG [place]) Inject 60 Units into the skin every morning.     Historical Provider, MD  leflunomide (ARAVA) 20 MG tablet Take 20 mg by mouth daily.    Historical Provider, MD  losartan  (COZAAR) 50 MG tablet Take 1 tablet (50 mg total) by mouth daily. 03/30/15   Sherren Mocha, MD  methimazole (TAPAZOLE) 5 MG tablet Take 5 mg by mouth daily. Taking one half tablet daily    Historical Provider, MD  metoprolol tartrate (LOPRESSOR) 25 MG tablet Take 1 tablet (25 mg total) by mouth 2 (two) times daily. 03/30/15   Sherren Mocha, MD  nabumetone (RELAFEN) 500 MG tablet Take 500 mg by mouth 2 (two) times daily.     Historical Provider, MD  Omega-3 Fatty Acids (FISH OIL) 1200 MG CAPS Take 1,200 mg by mouth 2 (two) times daily.    Historical Provider, MD  potassium chloride (K-DUR) 10 MEQ tablet Take 1 tablet (10 mEq total) by mouth daily. 03/30/15   Sherren Mocha, MD  potassium chloride (K-DUR,KLOR-CON) 10 MEQ tablet TAKE 1 TABLET BY MOUTH DAILY. 07/05/15   Sherren Mocha, MD  RiTUXimab (RITUXAN IV) Inject into the vein. Patient does not know dose she is taking    Historical Provider, MD  rosuvastatin (CRESTOR) 10 MG tablet Take 1 tablet (10 mg total) by mouth daily. 03/30/15   Sherren Mocha, MD  topiramate (TOPAMAX) 50 MG tablet Take 50 mg by mouth 2 (two) times daily.    Historical Provider, MD  vitamin C (ASCORBIC ACID) 500 MG tablet Take 500 mg by mouth daily.     Historical Provider, MD   Meds Ordered and Administered this Visit  Medications - No data to display  BP 114/74 mmHg  Pulse 81  Temp(Src) 98.2 F (36.8 C) (Oral)  Resp 16  Wt 169 lb (76.658 kg)  SpO2 95% No data found.   Physical Exam Nursing notes and Vital Signs reviewed. Appearance:  Patient appears stated age, and in no acute distress Eyes:  Pupils are equal, round, and reactive to light and accomodation.  Extraocular movement is intact.  Conjunctivae are not inflamed  Ears:  Canals normal.  Tympanic membranes normal.  Nose:  Mildly congested turbinates.  No sinus tenderness  Pharynx:  Normal Neck:  Supple.  Tender enlarged posterior nodes are palpated bilaterally  Lungs:  Clear to auscultation.  Breath sounds  are equal.  Moving air well. Chest:  Distinct tenderness to palpation over the mid-sternum.  Heart:  Regular rate and rhythm without murmurs, rubs, or gallops.  Abdomen:  Nontender without masses or hepatosplenomegaly.  Bowel sounds are present.  No CVA or flank tenderness.  Extremities:  No edema.  Skin:  No rash present.   ED Course  Procedures    MDM   1. Viral URI with cough    There is no evidence of bacterial infection today.  Treat symptomatically for now  Prescription written for Benzonatate [facility]) to take at bedtime for night-time cough.  Take plain guaifenesin (1200mg  extended release tabs such as Mucinex) twice daily, with plenty of water, for cough and congestion.  Get adequate rest.   May use Afrin nasal spray (or generic oxymetazoline) twice daily for about 5 days and then discontinue.  Also recommend using saline nasal spray several times daily and saline nasal irrigation (AYR is a common brand).  Use Flonase nasal spray each morning after using Afrin nasal spray and saline nasal irrigation. Try warm salt water gargles for sore throat.  Stop all antihistamines for now, and other non-prescription cough/cold preparations. Begin Amoxicillin if not improving about one week or if persistent fever develops (Given a prescription to hold, with an expiration date)  Follow-up with family doctor if not improving about10 days.     Kandra Nicolas, MD 02/27/16 [phone]

## 2016-02-27 NOTE — ED Notes (Signed)
Pt c/o 4 days of cough, congestion, eye swelling and drainage, sore throat, aches, chills and sweats. Taken Tylenol cold and Alka Seltzer plus otc. Received flu vac this season.

## 2016-02-27 NOTE — Discharge Instructions (Signed)
Take plain guaifenesin (1200mg extended release tabs such as Mucinex) twice daily, with plenty of water, for cough and congestion.  Get adequate rest.   °May use Afrin nasal spray (or generic oxymetazoline) twice daily for about 5 days and then discontinue.  Also recommend using saline nasal spray several times daily and saline nasal irrigation (AYR is a common brand).  Use Flonase nasal spray each morning after using Afrin nasal spray and saline nasal irrigation. °Try warm salt water gargles for sore throat.  °Stop all antihistamines for now, and other non-prescription cough/cold preparations. °Begin Amoxicillin if not improving about one week or if persistent fever develops   °Follow-up with family doctor if not improving about10 days.  °

## 2016-03-01 ENCOUNTER — Telehealth: Payer: Self-pay | Admitting: Radiology

## 2016-03-01 DIAGNOSIS — M79671 Pain in right foot: Secondary | ICD-10-CM | POA: Diagnosis not present

## 2016-03-01 DIAGNOSIS — M79641 Pain in right hand: Secondary | ICD-10-CM | POA: Diagnosis not present

## 2016-03-01 DIAGNOSIS — M25561 Pain in right knee: Secondary | ICD-10-CM | POA: Diagnosis not present

## 2016-03-01 DIAGNOSIS — M25562 Pain in left knee: Secondary | ICD-10-CM | POA: Diagnosis not present

## 2016-03-01 DIAGNOSIS — M79642 Pain in left hand: Secondary | ICD-10-CM | POA: Diagnosis not present

## 2016-03-01 DIAGNOSIS — M79672 Pain in left foot: Secondary | ICD-10-CM | POA: Diagnosis not present

## 2016-03-02 MED FILL — LEFLUNOMIDE 20 MG TABLET: 20 | 90 days supply | Qty: 90 | Fill #0

## 2016-03-02 MED FILL — HYDROXYCHLOROQUINE 200 MG T: 200 | 90 days supply | Qty: 135 | Fill #0

## 2016-03-03 NOTE — Telephone Encounter (Signed)
error 

## 2016-03-12 MED FILL — TOPIRAMATE 50 MG TABLET: 50 | 90 days supply | Qty: 180 | Fill #0

## 2016-03-12 MED FILL — SIMPONI 100 MG/ML SOAJ: 100 | 30 days supply | Qty: 1 | Fill #0

## 2016-03-12 MED FILL — METOPROLOL TARTRATE 25 MG T: 25 | 90 days supply | Qty: 180 | Fill #0

## 2016-03-16 DIAGNOSIS — Z79899 Other long term (current) drug therapy: Secondary | ICD-10-CM | POA: Diagnosis not present

## 2016-03-26 DIAGNOSIS — E1065 Type 1 diabetes mellitus with hyperglycemia: Secondary | ICD-10-CM | POA: Diagnosis not present

## 2016-03-26 DIAGNOSIS — E0500 Thyrotoxicosis with diffuse goiter without thyrotoxic crisis or storm: Secondary | ICD-10-CM | POA: Diagnosis not present

## 2016-03-26 DIAGNOSIS — E10319 Type 1 diabetes mellitus with unspecified diabetic retinopathy without macular edema: Secondary | ICD-10-CM | POA: Diagnosis not present

## 2016-03-27 DIAGNOSIS — E10319 Type 1 diabetes mellitus with unspecified diabetic retinopathy without macular edema: Secondary | ICD-10-CM | POA: Diagnosis not present

## 2016-03-27 DIAGNOSIS — E0500 Thyrotoxicosis with diffuse goiter without thyrotoxic crisis or storm: Secondary | ICD-10-CM | POA: Diagnosis not present

## 2016-03-27 DIAGNOSIS — E1065 Type 1 diabetes mellitus with hyperglycemia: Secondary | ICD-10-CM | POA: Diagnosis not present

## 2016-04-11 MED FILL — SIMPONI 100 MG/ML SOAJ: 100 | 30 days supply | Qty: 1 | Fill #1

## 2016-05-07 ENCOUNTER — Encounter (HOSPITAL_COMMUNITY): Payer: Self-pay | Admitting: Emergency Medicine

## 2016-05-07 ENCOUNTER — Telehealth: Payer: Self-pay | Admitting: Cardiovascular Disease

## 2016-05-07 ENCOUNTER — Observation Stay (HOSPITAL_COMMUNITY)
Admission: EM | Admit: 2016-05-07 | Discharge: 2016-05-09 | Disposition: A | Discharge status: Home / Self Care | Payer: 59 | Attending: Internal Medicine | Admitting: Internal Medicine

## 2016-05-07 ENCOUNTER — Emergency Department (HOSPITAL_COMMUNITY): Payer: 59

## 2016-05-07 DIAGNOSIS — E11319 Type 2 diabetes mellitus with unspecified diabetic retinopathy without macular edema: Secondary | ICD-10-CM | POA: Diagnosis not present

## 2016-05-07 DIAGNOSIS — I25119 Atherosclerotic heart disease of native coronary artery with unspecified angina pectoris: Secondary | ICD-10-CM | POA: Diagnosis not present

## 2016-05-07 DIAGNOSIS — R079 Chest pain, unspecified: Secondary | ICD-10-CM | POA: Diagnosis not present

## 2016-05-07 DIAGNOSIS — I252 Old myocardial infarction: Secondary | ICD-10-CM | POA: Diagnosis not present

## 2016-05-07 DIAGNOSIS — I25810 Atherosclerosis of coronary artery bypass graft(s) without angina pectoris: Secondary | ICD-10-CM | POA: Diagnosis present

## 2016-05-07 DIAGNOSIS — Z91041 Radiographic dye allergy status: Secondary | ICD-10-CM | POA: Diagnosis not present

## 2016-05-07 DIAGNOSIS — M069 Rheumatoid arthritis, unspecified: Secondary | ICD-10-CM | POA: Diagnosis not present

## 2016-05-07 DIAGNOSIS — R0602 Shortness of breath: Secondary | ICD-10-CM | POA: Diagnosis not present

## 2016-05-07 DIAGNOSIS — G43009 Migraine without aura, not intractable, without status migrainosus: Secondary | ICD-10-CM | POA: Diagnosis not present

## 2016-05-07 DIAGNOSIS — I25719 Atherosclerosis of autologous vein coronary artery bypass graft(s) with unspecified angina pectoris: Secondary | ICD-10-CM | POA: Diagnosis not present

## 2016-05-07 DIAGNOSIS — G43011 Migraine without aura, intractable, with status migrainosus: Secondary | ICD-10-CM | POA: Diagnosis present

## 2016-05-07 DIAGNOSIS — K219 Gastro-esophageal reflux disease without esophagitis: Secondary | ICD-10-CM | POA: Diagnosis not present

## 2016-05-07 DIAGNOSIS — Z951 Presence of aortocoronary bypass graft: Secondary | ICD-10-CM | POA: Diagnosis present

## 2016-05-07 DIAGNOSIS — Z794 Long term (current) use of insulin: Secondary | ICD-10-CM

## 2016-05-07 DIAGNOSIS — Z7982 Long term (current) use of aspirin: Secondary | ICD-10-CM | POA: Insufficient documentation

## 2016-05-07 DIAGNOSIS — Z955 Presence of coronary angioplasty implant and graft: Secondary | ICD-10-CM | POA: Diagnosis not present

## 2016-05-07 DIAGNOSIS — E119 Type 2 diabetes mellitus without complications: Secondary | ICD-10-CM

## 2016-05-07 DIAGNOSIS — I10 Essential (primary) hypertension: Secondary | ICD-10-CM | POA: Diagnosis not present

## 2016-05-07 DIAGNOSIS — E782 Mixed hyperlipidemia: Secondary | ICD-10-CM | POA: Diagnosis present

## 2016-05-07 DIAGNOSIS — R0789 Other chest pain: Secondary | ICD-10-CM | POA: Diagnosis not present

## 2016-05-07 HISTORY — DX: Presence of insulin pump (external) (internal): Z96.41

## 2016-05-07 LAB — BASIC METABOLIC PANEL
ANION GAP: 12 (ref 5–15)
BUN: 13 mg/dL (ref 6–20)
CHLORIDE: 108 mmol/L (ref 101–111)
CO2: 21 mmol/L — ABNORMAL LOW (ref 22–32)
Calcium: 9.2 mg/dL (ref 8.9–10.3)
Creatinine, Ser: 0.84 mg/dL (ref 0.44–1.00)
GFR calc Af Amer: >60 mL/min (ref >60)
GLUCOSE: 181 mg/dL — AB (ref 65–99)
POTASSIUM: 4 mmol/L (ref 3.5–5.1)
Sodium: 141 mmol/L (ref 135–145)

## 2016-05-07 LAB — CBC
HCT: 42 % (ref 36.0–46.0)
Hemoglobin: 14.4 g/dL (ref 12.0–15.0)
MCH: 30 pg (ref 26.0–34.0)
MCHC: 34.3 g/dL (ref 30.0–36.0)
MCV: 87.5 fL (ref 78.0–100.0)
PLATELETS: 177 10*3/uL (ref 150–400)
RBC: 4.8 MIL/uL (ref 3.87–5.11)
RDW: 12 % (ref 11.5–15.5)
WBC: 5.7 10*3/uL (ref 4.0–10.5)

## 2016-05-07 LAB — I-STAT TROPONIN, ED: TROPONIN I, POC: 0 ng/mL (ref 0.00–0.08)

## 2016-05-07 NOTE — Telephone Encounter (Signed)
°  Pt c/o of Chest Pain: 1. Are you having CP right now? YES A LITTLE 2. Are you experiencing any other symptoms (ex. SOB, nausea, vomiting, sweating)? NAUSEA 3. How long have you been experiencing CP? SEVERAL DAYS 4. Is your CP continuous or coming and going? CONTINUOUS 5. Have you taken Nitroglycerin? NO, IT'S EXPIRED

## 2016-05-07 NOTE — ED Notes (Signed)
Pt sts mid sternal CP through to back x 3 days; pt sts hx of CABG; pt sts pain worse with inspiration

## 2016-05-07 NOTE — Telephone Encounter (Signed)
I spoke with the pt and she complains of 5-6/10 chest pain at this time.  Her symptoms started on Saturday and were coming and going but yesterday and today her symptoms have become continuous. The pt has had nausea and diaphoresis associated with symptoms.  The pt does note some pain with inspiration but no change with movement.  The pt's pain is similar to what she has felt in the past but it is "milder". The pt did try 2 NTG yesterday which were expired and they did not help her symptoms.  I advised the pt that she should proceed to the [facility] ER.  She is currently in the process of picking her children up from school and she will need to get them settled in at home.  I advised her that we do not recommend driving at this time due to her symptoms.  The pt plans to present to the ER later today.

## 2016-05-08 ENCOUNTER — Encounter (HOSPITAL_COMMUNITY)
Admission: EM | Disposition: A | Discharge status: Home / Self Care | Payer: Self-pay | Source: Home / Self Care | Attending: Emergency Medicine

## 2016-05-08 ENCOUNTER — Encounter (HOSPITAL_COMMUNITY): Payer: Self-pay | Admitting: General Practice

## 2016-05-08 DIAGNOSIS — E119 Type 2 diabetes mellitus without complications: Secondary | ICD-10-CM

## 2016-05-08 DIAGNOSIS — I10 Essential (primary) hypertension: Secondary | ICD-10-CM | POA: Diagnosis not present

## 2016-05-08 DIAGNOSIS — R079 Chest pain, unspecified: Secondary | ICD-10-CM

## 2016-05-08 DIAGNOSIS — I25708 Atherosclerosis of coronary artery bypass graft(s), unspecified, with other forms of angina pectoris: Secondary | ICD-10-CM | POA: Diagnosis not present

## 2016-05-08 DIAGNOSIS — G43009 Migraine without aura, not intractable, without status migrainosus: Secondary | ICD-10-CM | POA: Diagnosis not present

## 2016-05-08 DIAGNOSIS — I25119 Atherosclerotic heart disease of native coronary artery with unspecified angina pectoris: Secondary | ICD-10-CM | POA: Diagnosis not present

## 2016-05-08 DIAGNOSIS — M069 Rheumatoid arthritis, unspecified: Secondary | ICD-10-CM | POA: Diagnosis not present

## 2016-05-08 DIAGNOSIS — I252 Old myocardial infarction: Secondary | ICD-10-CM | POA: Diagnosis not present

## 2016-05-08 DIAGNOSIS — E782 Mixed hyperlipidemia: Secondary | ICD-10-CM

## 2016-05-08 DIAGNOSIS — Z794 Long term (current) use of insulin: Secondary | ICD-10-CM

## 2016-05-08 DIAGNOSIS — Z955 Presence of coronary angioplasty implant and graft: Secondary | ICD-10-CM | POA: Diagnosis not present

## 2016-05-08 DIAGNOSIS — Z91041 Radiographic dye allergy status: Secondary | ICD-10-CM | POA: Diagnosis not present

## 2016-05-08 DIAGNOSIS — I25719 Atherosclerosis of autologous vein coronary artery bypass graft(s) with unspecified angina pectoris: Secondary | ICD-10-CM | POA: Diagnosis not present

## 2016-05-08 HISTORY — PX: CARDIAC CATHETERIZATION: SHX172

## 2016-05-08 LAB — GLUCOSE, CAPILLARY
GLUCOSE-CAPILLARY: 302 mg/dL — AB (ref 65–99)
GLUCOSE-CAPILLARY: 118 mg/dL — AB (ref 65–99)
GLUCOSE-CAPILLARY: 101 mg/dL — AB (ref 65–99)
Glucose-Capillary: 139 mg/dL — ABNORMAL HIGH (ref 65–99)
Glucose-Capillary: 139 mg/dL — ABNORMAL HIGH (ref 65–99)

## 2016-05-08 LAB — CBC
HCT: 38.8 % (ref 36.0–46.0)
Hemoglobin: 13.4 g/dL (ref 12.0–15.0)
MCH: 30 pg (ref 26.0–34.0)
MCHC: 34.5 g/dL (ref 30.0–36.0)
MCV: 86.8 fL (ref 78.0–100.0)
PLATELETS: 185 10*3/uL (ref 150–400)
RBC: 4.47 MIL/uL (ref 3.87–5.11)
RDW: 12.1 % (ref 11.5–15.5)
WBC: 5.8 10*3/uL (ref 4.0–10.5)

## 2016-05-08 LAB — PROTIME-INR
INR: 1.11 (ref 0.00–1.49)
Prothrombin Time: 14.5 seconds (ref 11.6–15.2)

## 2016-05-08 LAB — LIPID PANEL
CHOL/HDL RATIO: 2.2 ratio
CHOLESTEROL: 151 mg/dL (ref 0–200)
HDL: 69 mg/dL (ref >40)
LDL Cholesterol: 62 mg/dL (ref 0–99)
TRIGLYCERIDES: 101 mg/dL (ref <150)
VLDL: 20 mg/dL (ref 0–40)

## 2016-05-08 LAB — BRAIN NATRIURETIC PEPTIDE: B Natriuretic Peptide: 7.4 pg/mL (ref 0.0–100.0)

## 2016-05-08 LAB — CREATININE, SERUM
CREATININE: 0.77 mg/dL (ref 0.44–1.00)
GFR calc Af Amer: >60 mL/min (ref >60)

## 2016-05-08 LAB — TROPONIN I
Troponin I: <0.03 ng/mL (ref <0.031)
Troponin I: <0.03 ng/mL (ref <0.031)

## 2016-05-08 LAB — POCT ACTIVATED CLOTTING TIME: ACTIVATED CLOTTING TIME: 544 s

## 2016-05-08 LAB — TSH: TSH: 3.666 u[IU]/mL (ref 0.350–4.500)

## 2016-05-08 SURGERY — LEFT HEART CATH AND CORS/GRAFTS ANGIOGRAPHY

## 2016-05-08 MED ORDER — HEPARIN (PORCINE) IN NACL 2-0.9 UNIT/ML-% IJ SOLN
INTRAMUSCULAR | Status: AC
  Filled 2016-05-08: qty 1500

## 2016-05-08 MED ORDER — SODIUM CHLORIDE 0.9 % IV SOLN: 250.0000 mL | INTRAVENOUS | Status: DC | PRN

## 2016-05-08 MED ORDER — ROSUVASTATIN CALCIUM 10 MG PO TABS
10.0000 mg | ORAL_TABLET | Freq: Every day | ORAL | Status: DC
  Administered 2016-05-08: 10 mg via ORAL
  Filled 2016-05-08: qty 1

## 2016-05-08 MED ORDER — ISOSORBIDE MONONITRATE 15 MG HALF TABLET
15.0000 mg | ORAL_TABLET | Freq: Every day | ORAL | Status: DC
  Administered 2016-05-08 – 2016-05-09 (×2): 15 mg via ORAL
  Filled 2016-05-08 (×3): qty 1

## 2016-05-08 MED ORDER — INSULIN ASPART 100 UNIT/ML ~~LOC~~ SOLN: 0.0000 [IU] | Freq: Every day | SUBCUTANEOUS | Status: DC

## 2016-05-08 MED ORDER — LIDOCAINE HCL (PF) 1 % IJ SOLN
INTRAMUSCULAR | Status: DC | PRN
  Administered 2016-05-08: 2 mL via SUBCUTANEOUS

## 2016-05-08 MED ORDER — IOPAMIDOL (ISOVUE-370) INJECTION 76%
INTRAVENOUS | Status: AC
  Filled 2016-05-08: qty 50

## 2016-05-08 MED ORDER — VERAPAMIL HCL 2.5 MG/ML IV SOLN
INTRAVENOUS | Status: DC | PRN
  Administered 2016-05-08: 15:00:00 via INTRA_ARTERIAL

## 2016-05-08 MED ORDER — FUROSEMIDE 40 MG PO TABS
40.0000 mg | ORAL_TABLET | Freq: Every day | ORAL | Status: DC
Start: 2016-05-08 — End: 2016-05-09
  Administered 2016-05-09: 40 mg via ORAL
  Filled 2016-05-08 (×2): qty 1

## 2016-05-08 MED ORDER — MIDAZOLAM HCL 2 MG/2ML IJ SOLN
INTRAMUSCULAR | Status: AC
  Filled 2016-05-08: qty 2

## 2016-05-08 MED ORDER — SODIUM CHLORIDE 0.9% FLUSH: 3.0000 mL | INTRAVENOUS | Status: DC | PRN

## 2016-05-08 MED ORDER — ASPIRIN 81 MG PO CHEW: 81.0000 mg | CHEWABLE_TABLET | ORAL | Status: AC

## 2016-05-08 MED ORDER — SODIUM CHLORIDE 0.9 % IV SOLN
INTRAVENOUS | Status: AC
  Administered 2016-05-08: 17:00:00 via INTRAVENOUS

## 2016-05-08 MED ORDER — NITROGLYCERIN 1 MG/10 ML FOR IR/CATH LAB
INTRA_ARTERIAL | Status: AC
  Filled 2016-05-08: qty 10

## 2016-05-08 MED ORDER — INSULIN ASPART 100 UNIT/ML ~~LOC~~ SOLN: 0.0000 [IU] | Freq: Three times a day (TID) | SUBCUTANEOUS | Status: DC

## 2016-05-08 MED ORDER — HEPARIN (PORCINE) IN NACL 2-0.9 UNIT/ML-% IJ SOLN
INTRAMUSCULAR | Status: DC | PRN
  Administered 2016-05-08: 1500 mL

## 2016-05-08 MED ORDER — LIDOCAINE HCL (PF) 1 % IJ SOLN
INTRAMUSCULAR | Status: AC
  Filled 2016-05-08: qty 30

## 2016-05-08 MED ORDER — SODIUM CHLORIDE 0.9 % IV SOLN
INTRAVENOUS | Status: DC
  Administered 2016-05-08: 12:00:00 via INTRAVENOUS

## 2016-05-08 MED ORDER — SODIUM CHLORIDE 0.9% FLUSH
3.0000 mL | Freq: Two times a day (BID) | INTRAVENOUS | Status: DC
  Administered 2016-05-08 – 2016-05-09 (×2): 3 mL via INTRAVENOUS

## 2016-05-08 MED ORDER — ONDANSETRON HCL 4 MG/2ML IJ SOLN
INTRAMUSCULAR | Status: AC
  Filled 2016-05-08: qty 2

## 2016-05-08 MED ORDER — MIDAZOLAM HCL 2 MG/2ML IJ SOLN
INTRAMUSCULAR | Status: DC | PRN
  Administered 2016-05-08: 1 mg via INTRAVENOUS
  Administered 2016-05-08: 2 mg via INTRAVENOUS

## 2016-05-08 MED ORDER — PANTOPRAZOLE SODIUM 40 MG IV SOLR
40.0000 mg | Freq: Two times a day (BID) | INTRAVENOUS | Status: DC
  Administered 2016-05-08 – 2016-05-09 (×4): 40 mg via INTRAVENOUS
  Filled 2016-05-08 (×4): qty 40

## 2016-05-08 MED ORDER — FAMOTIDINE IN NACL 20-0.9 MG/50ML-% IV SOLN
20.0000 mg | Freq: Once | INTRAVENOUS | Status: AC
  Administered 2016-05-08: 20 mg via INTRAVENOUS
  Filled 2016-05-08: qty 50

## 2016-05-08 MED ORDER — DIPHENHYDRAMINE HCL 50 MG/ML IJ SOLN
50.0000 mg | Freq: Once | INTRAMUSCULAR | Status: AC
  Administered 2016-05-08: 50 mg via INTRAVENOUS
  Filled 2016-05-08: qty 1

## 2016-05-08 MED ORDER — LEFLUNOMIDE 20 MG PO TABS
20.0000 mg | ORAL_TABLET | Freq: Every day | ORAL | Status: DC
  Administered 2016-05-08 – 2016-05-09 (×2): 20 mg via ORAL
  Filled 2016-05-08 (×2): qty 1

## 2016-05-08 MED ORDER — HEPARIN SODIUM (PORCINE) 5000 UNIT/ML IJ SOLN
5000.0000 [IU] | Freq: Three times a day (TID) | INTRAMUSCULAR | Status: DC
  Administered 2016-05-08: 5000 [IU] via SUBCUTANEOUS
  Filled 2016-05-08: qty 1

## 2016-05-08 MED ORDER — FENTANYL CITRATE (PF) 100 MCG/2ML IJ SOLN
INTRAMUSCULAR | Status: DC | PRN
  Administered 2016-05-08 (×2): 25 ug via INTRAVENOUS

## 2016-05-08 MED ORDER — METHYLPREDNISOLONE SODIUM SUCC 40 MG IJ SOLR
40.0000 mg | Freq: Once | INTRAMUSCULAR | Status: AC
  Administered 2016-05-08: 40 mg via INTRAVENOUS
  Filled 2016-05-08: qty 1

## 2016-05-08 MED ORDER — LOSARTAN POTASSIUM 50 MG PO TABS
50.0000 mg | ORAL_TABLET | Freq: Every day | ORAL | Status: DC
  Administered 2016-05-08 – 2016-05-09 (×2): 50 mg via ORAL
  Filled 2016-05-08 (×2): qty 1

## 2016-05-08 MED ORDER — NITROGLYCERIN 1 MG/10 ML FOR IR/CATH LAB
INTRA_ARTERIAL | Status: DC | PRN
  Administered 2016-05-08: 200 ug via INTRA_ARTERIAL

## 2016-05-08 MED ORDER — SODIUM CHLORIDE 0.9% FLUSH: 3.0000 mL | Freq: Two times a day (BID) | INTRAVENOUS | Status: DC

## 2016-05-08 MED ORDER — IOPAMIDOL (ISOVUE-370) INJECTION 76%
INTRAVENOUS | Status: DC | PRN
  Administered 2016-05-08: 135 mL via INTRA_ARTERIAL

## 2016-05-08 MED ORDER — METHIMAZOLE 5 MG PO TABS
2.5000 mg | ORAL_TABLET | Freq: Every day | ORAL | Status: DC
  Filled 2016-05-08 (×2): qty 1

## 2016-05-08 MED ORDER — CLOPIDOGREL BISULFATE 75 MG PO TABS: 75.0000 mg | ORAL_TABLET | Freq: Every day | ORAL | Status: DC

## 2016-05-08 MED ORDER — CYCLOBENZAPRINE HCL 10 MG PO TABS
10.0000 mg | ORAL_TABLET | Freq: Every day | ORAL | Status: DC
  Administered 2016-05-08: 10 mg via ORAL
  Filled 2016-05-08: qty 1

## 2016-05-08 MED ORDER — ADENOSINE 12 MG/4ML IV SOLN
16.0000 mL | Freq: Once | INTRAVENOUS | Status: DC
  Filled 2016-05-08: qty 16

## 2016-05-08 MED ORDER — TOPIRAMATE 25 MG PO TABS
50.0000 mg | ORAL_TABLET | Freq: Two times a day (BID) | ORAL | Status: DC
  Administered 2016-05-08 – 2016-05-09 (×3): 50 mg via ORAL
  Filled 2016-05-08 (×4): qty 2

## 2016-05-08 MED ORDER — ASPIRIN 81 MG PO CHEW
81.0000 mg | CHEWABLE_TABLET | Freq: Every day | ORAL | Status: DC
  Administered 2016-05-08 – 2016-05-09 (×2): 81 mg via ORAL
  Filled 2016-05-08 (×3): qty 1

## 2016-05-08 MED ORDER — INSULIN PUMP
SUBCUTANEOUS | Status: DC
  Administered 2016-05-08: 7.2 via SUBCUTANEOUS
  Administered 2016-05-09: 6.7 via SUBCUTANEOUS
  Filled 2016-05-08: qty 1

## 2016-05-08 MED ORDER — ONDANSETRON HCL 4 MG/2ML IJ SOLN
INTRAMUSCULAR | Status: DC | PRN
  Administered 2016-05-08: 4 mg via INTRAVENOUS

## 2016-05-08 MED ORDER — HEPARIN SODIUM (PORCINE) 1000 UNIT/ML IJ SOLN
INTRAMUSCULAR | Status: DC | PRN
  Administered 2016-05-08: 6000 [IU] via INTRAVENOUS
  Administered 2016-05-08: 4000 [IU] via INTRAVENOUS

## 2016-05-08 MED ORDER — METOPROLOL TARTRATE 25 MG PO TABS
25.0000 mg | ORAL_TABLET | Freq: Two times a day (BID) | ORAL | Status: DC
  Administered 2016-05-08 – 2016-05-09 (×3): 25 mg via ORAL
  Filled 2016-05-08 (×3): qty 1

## 2016-05-08 MED ORDER — FENTANYL CITRATE (PF) 100 MCG/2ML IJ SOLN
INTRAMUSCULAR | Status: AC
  Filled 2016-05-08: qty 2

## 2016-05-08 MED ORDER — ADENOSINE (DIAGNOSTIC) 140MCG/KG/MIN
INTRAVENOUS | Status: DC | PRN
  Administered 2016-05-08: 140 ug/kg/min via INTRAVENOUS

## 2016-05-08 MED ORDER — NITROGLYCERIN 0.4 MG SL SUBL
SUBLINGUAL_TABLET | SUBLINGUAL | Status: AC
  Administered 2016-05-08: 05:00:00
  Administered 2016-05-08: 0.4 mg
  Filled 2016-05-08: qty 1

## 2016-05-08 MED ORDER — CLOPIDOGREL BISULFATE 75 MG PO TABS: 600.0000 mg | ORAL_TABLET | Freq: Once | ORAL | Status: DC

## 2016-05-08 SURGICAL SUPPLY — 13 items
CATH INFINITI 5 FR IM (CATHETERS) ×1 IMPLANT
CATH INFINITI 5FR MULTPACK ANG (CATHETERS) ×1 IMPLANT
CATH LAUNCHER 5F EBU3.0 (CATHETERS) IMPLANT
CATHETER LAUNCHER 5F EBU3.0 (CATHETERS) ×2
DEVICE RAD COMP TR BAND LRG (VASCULAR PRODUCTS) ×1 IMPLANT
GLIDESHEATH SLEND SS 6F .021 (SHEATH) ×1 IMPLANT
GUIDEWIRE PRESSURE COMET II (WIRE) ×1 IMPLANT
KIT ESSENTIALS PG (KITS) ×1 IMPLANT
KIT HEART LEFT (KITS) ×2 IMPLANT
PACK CARDIAC CATHETERIZATION (CUSTOM PROCEDURE TRAY) ×2 IMPLANT
TRANSDUCER W/STOPCOCK (MISCELLANEOUS) ×2 IMPLANT
TUBING CIL FLEX 10 FLL-RA (TUBING) ×2 IMPLANT
WIRE SAFE-T 1.5MM-J .035X260CM (WIRE) ×1 IMPLANT

## 2016-05-08 NOTE — H&P (Addendum)
CARDIOLOGY INPATIENT HISTORY AND PHYSICAL EXAMINATION NOTE  Patient ID: [name] MRN: [id number], DOB/AGE: September 21, 1966   Admit date: 05/07/2016   Primary Physician: Dell Ponto, DO Primary Cardiologist: Sherren Mocha MD  Reason for admission: chest pain  HPI: This is a 50 y.o. female with inferior MI s/p DES to RCA in 2010 and then LAD occlusion s/p CABG in 2010 by Dr. Servando Snare, DM type I, postmenopausal, HTN, HLD and RA who presented with chief complaint of chest pain.  Patient was in her usual state of healthy until 3 days ago when she developed sudden onset chest pain at rest. This chest pain was present in an orange sized area in the lower part of the sternal region without any radiation to arm, 5/10 in intensity. She tried to wait and did not initially seek medical assistance but on Sunday it became constant. She came to work yesterday with the pain. The pain never went away. She took 2 NTG today and called Dr. Burt Knack at which time, she wasa told to go to the ED. She picked her children up from the school and came to the ED where she had negative EKG and initial troponin.  Of note, patient has prior history of several episodes of these similar chest pains. This chest pain is similar to her pre CABG chest pain but that pain also radiated down her left arm. Patient also has back pain on the left side in the muscle and there is muscle tightness in that area. She also has ongoing migraine for the last 2-3 days with nausea as well. This pain gets worse with deep breathing and changes with food intake. She has not had an EGD done recently.  She also has been having exertional dyspnea lately. She works as a Therapist, sports in [employer] and did her own EKG today but did not find anything wrong. In the past she had 2 stress test for similar atypical chest pain, one in 2011 and the other in 2014. Myoview in 2014 for atypical chest pain was negative for ischemia.  Recent echocardiogram did not show any  significant abnormality. EKG today is unchanged.  Troponin is negative.   Problem List: Past Medical History  Diagnosis Date  . SINUS TACHYCARDIA 11/08/2010  . MIGRAINE W/O AURA W/INTRACT W/STATUS MIGRAINOSUS 02/19/2008  . HYPERLIPIDEMIA-MIXED   . CAD, ARTERY BYPASS GRAFT   . Atrial fibrillation ([place])   . ANGINA, STABLE/EXERTIONAL   . ACUT MI ANTEROLAT WALL SUBSQT EPIS CARE   . URI   . TRIGGER FINGER   . Shortness of breath   . HYPERTHYROIDISM   . Edema   . DIZZINESS   . Diarrhea   . DIABETIC  RETINOPATHY   . DIABETES MELLITUS, TYPE I   . DERMATITIS, ALLERGIC   . CHOLELITHIASIS   . CHEST PAIN   . CARPAL TUNNEL SYNDROME, BILATERAL   . ARTHRITIS, RHEUMATOID   . ALLERGIC RHINITIS, SEASONAL   . Acute maxillary sinusitis     Past Surgical History  Procedure Laterality Date  . Coronary artery bypass graft    . Vitrectomy    . Caesarean section    . Carpal tunnel release    . Cholecystectomy       Allergies:  Allergies  Allergen Reactions  . Prochlorperazine Rash    Neuro problems per pt Neurological reaction  . Ramipril Swelling, Rash and Other (See Comments)  . Shellfish-Derived Products Swelling    Shrimp(Facial swelling) Shrimp(Facial swelling)  . Atorvastatin Rash  Elevated LFT's Elevated LFT's Elevated LFT's  . Etanercept Swelling and Rash  . Infliximab Rash  . Orencia [Abatacept] Rash  . Shellfish Allergy Swelling    Shrimp(Facial swelling)  . Tofacitinib Rash and Other (See Comments)    Severe abdominal pain Severe abdominal pain Severe abdominal pain  . Prochlorperazine Edisylate   . Amiodarone Nausea Only  . Rituximab Rash    Causes a rash     Home Medications No current facility-administered medications for this encounter.   Current Outpatient Prescriptions  Medication Sig Dispense Refill  . aspirin 81 MG tablet Take 81 mg by mouth daily.     . cyclobenzaprine (FLEXERIL) 10 MG tablet Take 10 mg by mouth at bedtime.     Marland Kitchen EPINEPHrine  0.15 MG/0.15ML SOAJ Inject 0.15 mg into the skin as needed. Inject as directed.    . furosemide (LASIX) 40 MG tablet Take 1 tablet (40 mg total) by mouth daily. 90 tablet 3  . Golimumab (SIMPONI) 100 MG/ML SOAJ Inject 100 mg into the skin every 30 (thirty) days.    . insulin lispro (HUMALOG) 100 UNIT/ML injection FOR USE IN INSULIN PUMP. TOTAL DAILY INSULIN DOSE = UP TO 90 UNITS.    Marland Kitchen leflunomide (ARAVA) 20 MG tablet Take 20 mg by mouth daily.    Marland Kitchen losartan (COZAAR) 50 MG tablet Take 1 tablet (50 mg total) by mouth daily. 90 tablet 3  . methimazole (TAPAZOLE) 5 MG tablet Take 2.5 mg by mouth daily.     . metoprolol tartrate (LOPRESSOR) 25 MG tablet Take 1 tablet (25 mg total) by mouth 2 (two) times daily. 180 tablet 3  . nabumetone (RELAFEN) 500 MG tablet Take 500 mg by mouth 2 (two) times daily.     . Omega-3 Fatty Acids (FISH OIL) 1200 MG CAPS Take 1,200 mg by mouth 2 (two) times daily.    . potassium chloride (K-DUR) 10 MEQ tablet Take 1 tablet (10 mEq total) by mouth daily. 90 tablet 3  . rosuvastatin (CRESTOR) 10 MG tablet Take 1 tablet (10 mg total) by mouth daily. 90 tablet 3  . topiramate (TOPAMAX) 50 MG tablet Take 50 mg by mouth 2 (two) times daily.    Marland Kitchen amoxicillin (AMOXIL) 875 MG tablet Take 1 tablet (875 mg total) by mouth 2 (two) times daily. (Rx void after 03/07/16) (Patient not taking: Reported on 05/08/2016) 20 tablet 0  . benzonatate (TESSALON) 200 MG capsule Take 1 capsule (200 mg total) by mouth at bedtime. Take as needed for cough (Patient not taking: Reported on 05/08/2016) 12 capsule 0  . potassium chloride (K-DUR,KLOR-CON) 10 MEQ tablet TAKE 1 TABLET BY MOUTH DAILY. (Patient not taking: Reported on 05/08/2016) 90 tablet 2     Family History  Problem Relation Age of Onset  . Diabetes    . Heart disease    . Hypertension    . Hyperlipidemia    . Depression    . Migraines    . Heart attack Neg Hx   . Stroke Paternal Grandmother      Social History   Social History  .  Marital Status: Legally Separated    Spouse Name: N/A  . Number of Children: N/A  . Years of Education: N/A   Occupational History  . Not on file.   Social History Main Topics  . Smoking status: Never Smoker   . Smokeless tobacco: Not on file  . Alcohol Use: No  . Drug Use: No  . Sexual Activity: Not on  file   Other Topics Concern  . Not on file   Social History Narrative   Married to [place]. Has 2 kids(fraternal twins) age 76. 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 Never smoked, denies ETOH, no drugs. Drinks diet coke and 4 cups of coffee daily. No exercise.            Review of Systems: General: negative for chills, fever, night sweats or weight changes.  Cardiovascular: chest pain, dyspnea negative for dyspnea on exertion, edema, orthopnea, palpitations, paroxysmal nocturnal dyspnea  Dermatological: negative for rash Respiratory: negative for cough or wheezing Urologic: negative for hematuria Abdominal: nausea negative for vomiting, diarrhea, bright red blood per rectum, melena, or hematemesis Neurologic: negative for visual changes, syncope, or dizziness Endocrine: diabetes and thyroid disease Immunological: RA  Psych: non homicidal/suicidal  Physical Exam: Vitals: BP 133/75 mmHg  Pulse 88  Temp(Src) 98.5 F (36.9 C) (Oral)  Resp 18  SpO2 100% General: not in acute distress Neck: JVP flat, neck supple Heart: regular rate and rhythm, S1, S2, no murmurs  Lungs: CTAB  GI: non tender, non distended, bowel sounds present Extremities: no edema Neuro: AAO x 3  Back/musculoskeletal: tenderness and tightness of the erector spinae muscle on the left side Chest: no tenderness on palpation or movement of the costochondral joints Psych: normal affect, no anxiety Labs:   Results for orders placed or performed during the hospital encounter of 05/07/16 (from the past 24 hour(s))  Basic metabolic panel     Status: Abnormal    Collection Time: 05/07/16  6:29 PM  Result Value Ref Range   Sodium 141 135 - 145 mmol/L   Potassium 4.0 3.5 - 5.1 mmol/L   Chloride 108 101 - 111 mmol/L   CO2 21 (L) 22 - 32 mmol/L   Glucose, Bld 181 (H) 65 - 99 mg/dL   BUN 13 6 - 20 mg/dL   Creatinine, Ser 0.84 0.44 - 1.00 mg/dL   Calcium 9.2 8.9 - 10.3 mg/dL   GFR calc non Af Amer >60 >60 mL/min   GFR calc Af Amer >60 >60 mL/min   Anion gap 12 5 - 15  CBC     Status: None   Collection Time: 05/07/16  6:29 PM  Result Value Ref Range   WBC 5.7 4.0 - 10.5 K/uL   RBC 4.80 3.87 - 5.11 MIL/uL   Hemoglobin 14.4 12.0 - 15.0 g/dL   HCT 42.0 36.0 - 46.0 %   MCV 87.5 78.0 - 100.0 fL   MCH 30.0 26.0 - 34.0 pg   MCHC 34.3 30.0 - 36.0 g/dL   RDW 12.0 11.5 - 15.5 %   Platelets 177 150 - 400 K/uL  I-stat troponin, ED     Status: None   Collection Time: 05/07/16  6:46 PM  Result Value Ref Range   Troponin i, poc 0.00 0.00 - 0.08 ng/mL   Comment 3             Radiology/Studies: Dg Chest 2 View  05/07/2016  CLINICAL DATA:  Midsternal chest pain extending into the back for 3 days. History of CABG in 2010. No acute injury. EXAM: CHEST  2 VIEW COMPARISON:  Radiographs 08/01/2009 and 07/04/2009. FINDINGS: The heart size and mediastinal contours are stable status post CABG. Multiple sternotomy wires have fractured since the prior studies. No evidence of significant sternal dehiscence or retrosternal abnormality. The lungs are clear. There is no pleural effusion or pneumothorax.  No acute osseous findings are seen. IMPRESSION: No definite acute findings. Several of the sternotomy wires have fractured since the 2010 studies, but there is no gross sternal dehiscence. Electronically Signed   By: Richardean Sale M.D.   On: 05/07/2016 19:25   EGD 07/06/2009 - mild gastritis of the antrum  EKG: normal sinus rhythm, right atrial enlargement, normal axis, no ST T wave changes, low QRS voltage  Echo: 06/10/2014 - Left ventricle: The cavity size was normal. Wall  thickness was normal. Systolic function was vigorous. The estimated ejection fraction was in the range of 65% to 70%. - Aortic valve: There was trivial regurgitation.  Cardiac cath: 2010   Medical decision making:  Discussed care with the patient Discussed care with the ED physician on the phone Reviewed labs and imaging personally Reviewed prior records  ASSESSMENT AND PLAN:  This is a 50 y.o. female  with inferior MI s/p DES to RCA in 2010 and then LAD occlusion s/p CABG in 2010 by Dr. Servando Snare, DM2, postmenopausal, HTN, HLD and RA who presented with chief complaint of chest pain.    Principal Problem:   Chest pain syndrome Active Problems:   MIGRAINE W/O AURA W/INTRACT W/STATUS MIGRAINOSUS   CAD, ARTERY BYPASS GRAFT   Hypertension, essential   Hyperlipidemia, mixed   Diabetes mellitus with no complication (HCC)  Chest pain syndrome  Likely secondary to GERD, will keep overnight to r/o ACS Will continue medical therapy for chest pain and keep patient NPO IV protonix for now, will consider stress test vs. Cardiac catheterization depending on the patient's preference to r/o worsening CAD.   Chronic CAD s/p CABG Reviewed last echocardiogram from 2015 Continue aspirin, losartan, metoprolol and crestor  Repeat echocardiogram to evaluate for wall motion  Hyperlipidemia, mixed Continue crestor, checking lipid profile  Diabetes mellitus with no complication On insulin pump, patient wants to manage her own insulin pump during the stay SSI, finger sticks No oral hypoglycemics HbA1c  Migraine Prn medications for pain control  Rheumatoid arthritis Continue leflunomide   Signed, Flossie Dibble, MD MS 05/08/2016, 12:47 AM

## 2016-05-08 NOTE — Progress Notes (Signed)
Nursing note, spoke with PA regarding patient, stated orders for pre medication were written and to go ahead and give medication now. Will monitor patient [name], Bettina Gavia RN

## 2016-05-08 NOTE — Progress Notes (Addendum)
Cath order placed, CABG anatomy as below:  DATE OF PROCEDURE: 06/07/2009 DATE OF DISCHARGE:   OPERATIVE REPORT  PREOPERATIVE DIAGNOSES: 1. Recurrent angina. 2. History of drug-coated stent, right coronary artery.  POSTOPERATIVE DIAGNOSES: 1. Recurrent angina. 2. History of drug-coated stent, right coronary artery.  SURGICAL PROCEDURE: Coronary artery bypass grafting x3 with left internal mammary to the left anterior descending coronary artery, reverse saphenous vein graft to the first diagonal coronary artery and reverse saphenous vein graft to the distal right coronary artery with right thigh endovein harvesting.   Hilbert Corrigan PA Pager: [phone]    Addendum: discussed with patient, she says she has contrast allergy which make her break out in [place], does not appear to be anaphylactic reaction, but still should be premedicated. Pretreatment ordered.

## 2016-05-08 NOTE — ED Notes (Signed)
Cardiologist at bedside speaking with pt about plan of care.

## 2016-05-08 NOTE — Progress Notes (Signed)
Patient arrived from cath lab with TR band on Left radial site, per report [place]oks air in band, vital signs obtained, hand with positive radial pulse, hand cold to touch and dusky in color sats 99%  On room air in Left hand. Telemetry placed and CCMD made aware will continue to monitor patient. Caramia Boutin, Bettina Gavia RN

## 2016-05-08 NOTE — Progress Notes (Signed)
Pt gave herself 3.7 units of insulin coverage tonight via her insulin pump.

## 2016-05-08 NOTE — Progress Notes (Signed)
Patient ID: [name], female   DOB: [birth year], 50 y.o.   MRN: [id number]    Subjective:  Still some SSCP  Objective:  Filed Vitals:   05/08/16 0200 05/08/16 0230 05/08/16 0245 05/08/16 0353  BP: 98/84 141/71 128/76 143/81  Pulse: 83 77 84 72  Temp:    98.2 F (36.8 C)  TempSrc:    Oral  Resp: 14 15 20 16   Weight:    76.885 kg (169 lb 8 oz)  SpO2: 96% 98% 98% 100%    Intake/Output from previous day: No intake or output data in the 24 hours ending 05/08/16 0820  Physical Exam: Affect appropriate Healthy:  appears stated age 38: normal Neck supple with no adenopathy JVP normal no bruits no thyromegaly Lungs clear with no wheezing and good diaphragmatic motion Heart:  S1/S2 no murmur, no rub, gallop or click sternotomy PMI normal Abdomen: benighn, BS positve, no tenderness, no AAA no bruit.  No HSM or HJR Distal pulses intact with no bruits No edema Neuro non-focal Skin warm and dry No muscular weakness   Lab Results: Basic Metabolic Panel:  Recent Labs  05/07/16 1829 05/08/16 0412  NA 141  --   K 4.0  --   CL 108  --   CO2 21*  --   GLUCOSE 181*  --   BUN 13  --   CREATININE 0.84 0.77  CALCIUM 9.2  --    CBC:  Recent Labs  05/07/16 1829 05/08/16 0412  WBC 5.7 5.8  HGB 14.4 13.4  HCT 42.0 38.8  MCV 87.5 86.8  PLT 177 185   Cardiac Enzymes:  Recent Labs  05/08/16 0412  TROPONINI <0.03   Fasting Lipid Panel:  Recent Labs  05/08/16 0412  CHOL 151  HDL 69  LDLCALC 62  TRIG 101  CHOLHDL 2.2   Thyroid Function Tests:  Recent Labs  05/08/16 0412  TSH 3.666    Imaging: Dg Chest 2 View  05/07/2016  CLINICAL DATA:  Midsternal chest pain extending into the back for 3 days. History of CABG in 2010. No acute injury. EXAM: CHEST  2 VIEW COMPARISON:  Radiographs 08/01/2009 and 07/04/2009. FINDINGS: The heart size and mediastinal contours are stable status post CABG. Multiple sternotomy wires have fractured since the prior studies. No  evidence of significant sternal dehiscence or retrosternal abnormality. The lungs are clear. There is no pleural effusion or pneumothorax. No acute osseous findings are seen. IMPRESSION: No definite acute findings. Several of the sternotomy wires have fractured since the 2010 studies, but there is no gross sternal dehiscence. Electronically Signed   By: Richardean Sale M.D.   On: 05/07/2016 19:25    Cardiac Studies:  ECG: SR low voltage old IMI   Telemetry:  NSR 05/08/2016   Echo: 06/10/14  EF 65% mild AR  Medications:   . aspirin  81 mg Oral Daily  . cyclobenzaprine  10 mg Oral QHS  . furosemide  40 mg Oral Daily  . heparin  5,000 Units Subcutaneous Q8H  . insulin aspart  0-5 Units Subcutaneous QHS  . insulin aspart  0-9 Units Subcutaneous TID WC  . leflunomide  20 mg Oral Daily  . losartan  50 mg Oral Daily  . methimazole  2.5 mg Oral Daily  . metoprolol tartrate  25 mg Oral BID  . pantoprazole (PROTONIX) IV  40 mg Intravenous Q12H  . rosuvastatin  10 mg Oral q1800  . sodium chloride flush  3 mL Intravenous Q12H  .  topiramate  50 mg Oral BID       Assessment/Plan:  DM:  Has insulin pump will adjust down npo except toast for cath latter today  ChoL  On statin target LDL less than 70 HTN:  Well controlled.  Continue current medications and low sodium Dash type diet.   Thyroid:  Has been hard to regulate on methimazole TSH normal Chest Pain:  IDDM old IMI CABG 2010 pain similar to previous angina for cath latter today lab called orders written Cant find old CABG report CXR with two vein graft marker and / clip for LIMA  [name] Rouge 05/08/2016, 8:20 AM

## 2016-05-08 NOTE — Interval H&P Note (Signed)
History and Physical Interval Note:  05/08/2016 2:21 PM  [name]  has presented today for cardiac cath with the diagnosis of CAD with unstable angina. The various methods of treatment have been discussed with the patient and family. After consideration of risks, benefits and other options for treatment, the patient has consented to  Procedure(s): Left Heart Cath and Cors/Grafts Angiography (N/A) as a surgical intervention .  The patient's history has been reviewed, patient examined, no change in status, stable for surgery.  I have reviewed the patient's chart and labs.  Questions were answered to the patient's satisfaction.     Asuncion Shibata

## 2016-05-08 NOTE — ED Notes (Signed)
Insulin pump in place, pt reports blood sugar at 81. Given apple juice per cardiology to maintain blood sugar. Pt to be NPO for stress test

## 2016-05-08 NOTE — H&P (View-Only) (Signed)
Patient ID: [name], female   DOB: 12-22-1966, 50 y.o.   MRN: [id number]    Subjective:  Still some SSCP  Objective:  Filed Vitals:   05/08/16 0200 05/08/16 0230 05/08/16 0245 05/08/16 0353  BP: 98/84 141/71 128/76 143/81  Pulse: 83 77 84 72  Temp:    98.2 F (36.8 C)  TempSrc:    Oral  Resp: 14 15 20 16   Weight:    76.885 kg (169 lb 8 oz)  SpO2: 96% 98% 98% 100%    Intake/Output from previous day: No intake or output data in the 24 hours ending 05/08/16 0820  Physical Exam: Affect appropriate Healthy:  appears stated age 50: normal Neck supple with no adenopathy JVP normal no bruits no thyromegaly Lungs clear with no wheezing and good diaphragmatic motion Heart:  S1/S2 no murmur, no rub, gallop or click sternotomy PMI normal Abdomen: benighn, BS positve, no tenderness, no AAA no bruit.  No HSM or HJR Distal pulses intact with no bruits No edema Neuro non-focal Skin warm and dry No muscular weakness   Lab Results: Basic Metabolic Panel:  Recent Labs  05/07/16 1829 05/08/16 0412  NA 141  --   K 4.0  --   CL 108  --   CO2 21*  --   GLUCOSE 181*  --   BUN 13  --   CREATININE 0.84 0.77  CALCIUM 9.2  --    CBC:  Recent Labs  05/07/16 1829 05/08/16 0412  WBC 5.7 5.8  HGB 14.4 13.4  HCT 42.0 38.8  MCV 87.5 86.8  PLT 177 185   Cardiac Enzymes:  Recent Labs  05/08/16 0412  TROPONINI <0.03   Fasting Lipid Panel:  Recent Labs  05/08/16 0412  CHOL 151  HDL 69  LDLCALC 62  TRIG 101  CHOLHDL 2.2   Thyroid Function Tests:  Recent Labs  05/08/16 0412  TSH 3.666    Imaging: Dg Chest 2 View  05/07/2016  CLINICAL DATA:  Midsternal chest pain extending into the back for 3 days. History of CABG in 2010. No acute injury. EXAM: CHEST  2 VIEW COMPARISON:  Radiographs 08/01/2009 and 07/04/2009. FINDINGS: The heart size and mediastinal contours are stable status post CABG. Multiple sternotomy wires have fractured since the prior studies. No  evidence of significant sternal dehiscence or retrosternal abnormality. The lungs are clear. There is no pleural effusion or pneumothorax. No acute osseous findings are seen. IMPRESSION: No definite acute findings. Several of the sternotomy wires have fractured since the 2010 studies, but there is no gross sternal dehiscence. Electronically Signed   By: Richardean Sale M.D.   On: 05/07/2016 19:25    Cardiac Studies:  ECG: SR low voltage old IMI   Telemetry:  NSR 05/08/2016   Echo: 06/10/14  EF 65% mild AR  Medications:   . aspirin  81 mg Oral Daily  . cyclobenzaprine  10 mg Oral QHS  . furosemide  40 mg Oral Daily  . heparin  5,000 Units Subcutaneous Q8H  . insulin aspart  0-5 Units Subcutaneous QHS  . insulin aspart  0-9 Units Subcutaneous TID WC  . leflunomide  20 mg Oral Daily  . losartan  50 mg Oral Daily  . methimazole  2.5 mg Oral Daily  . metoprolol tartrate  25 mg Oral BID  . pantoprazole (PROTONIX) IV  40 mg Intravenous Q12H  . rosuvastatin  10 mg Oral q1800  . sodium chloride flush  3 mL Intravenous Q12H  .  topiramate  50 mg Oral BID       Assessment/Plan:  DM:  Has insulin pump will adjust down npo except toast for cath latter today  ChoL  On statin target LDL less than 70 HTN:  Well controlled.  Continue current medications and low sodium Dash type diet.   Thyroid:  Has been hard to regulate on methimazole TSH normal Chest Pain:  IDDM old IMI CABG 2010 pain similar to previous angina for cath latter today lab called orders written Cant find old CABG report CXR with two vein graft marker and / clip for LIMA  [name] Rouge 05/08/2016, 8:20 AM

## 2016-05-08 NOTE — Progress Notes (Addendum)
Patient with complaints of numbness and tingling to fingers/ hand is cold to touch, with positive pulse and Sats ranging from 97-99 % in left hand,  Site no hematoma noted. Patient with bleeding at site when deflating TR Band, air that was removed was reinserted and bleeding subsided, currently at [facility] air, Cath lab notified to see patient. Will continue to monitor patient. Emonnie Cannady, Bettina Gavia RN

## 2016-05-08 NOTE — Progress Notes (Signed)
RN paged M.D regarding pt having constant chest pain. VSS, 12 Lead EKG completed. Pt given 2 Nitro with no relief.  M.D returned page and is aware. No new orders given at this time. RN will continue to monitor.  Arnell Sieving, RN

## 2016-05-08 NOTE — Progress Notes (Signed)
Called to 2w03 for TR band check. TR band had rebled and was in place on left wrist at 15cc. Patient complaining of hand tingling/numbness. No hematoma present, left hand slightly cold to the touch, SPO@ was 98% mieasured in left hand. TR band adjusted to 12cc, no bleeding present. Patient stated hand tingling better. Left arm elevated with 3 pillows and heat pack applied to left hand.

## 2016-05-08 NOTE — ED Notes (Signed)
Cardiology at bedside.

## 2016-05-08 NOTE — Progress Notes (Addendum)
Inpatient Diabetes Program Recommendations  AACE/ADA: New Consensus Statement on Inpatient Glycemic Control (2015)  Target Ranges:  Prepandial:   less than 140 mg/dL      Peak postprandial:   less than 180 mg/dL (1-2 hours)      Critically ill patients:  140 - 180 mg/dL   Review of Glycemic Control  Inpatient Diabetes Program Recommendations:    Spoke briefly with patient who states she using her insulin pump with basal settings at 50% of her normal rates. Will check her pump and settings once patient is free from assessment by cardiology/cath  RN. Entered insulin pump order set with co-sign required as patient is to remain on her pump during cath procedure. Orders for sensitive correction tidwc and HS, however patient is NPO and pump order set orders cbg's q4hrs. Ad-Spoke with patient at length. She has had type 1 since she was 50 years old. She reported her pump settings as follows: Basal rates: 12 mn-3 am @ 1.7 units/hr 3 am-6 am @ 1.6  6 am to 2 pm @ 1.8 2 pm to 10 pm @ 1.8 Bolus Insulin to cho ratio: 1 unit/9 grams cho Sensitivity factor: 26 mg/dL Target goal glucose set at 110-120 mg/dL Patient is giving her own correction boluses and confirms her glucose with hospital meter. Glad to follow and assist as needed. (pt due to change out infusion set by tomorrow, 05/09/16.)  Thank you Rosita Kea, RN, MSN, CDE  Diabetes Inpatient Program Office: [phone] Pager: [phone] 8:00 am to 5:00 pm

## 2016-05-08 NOTE — ED Provider Notes (Signed)
CSN: [id number]     Arrival date & time 05/07/16  1810 History  By signing my name below, I, Altamease Oiler, attest that this documentation has been prepared under the direction and in the presence of Orpah Greek, MD. Electronically Signed: Altamease Oiler, ED Scribe. 05/08/2016. 12:19 AM   Chief Complaint  Patient presents with  . Chest Pain   The history is provided by the patient. No language interpreter was used.   [name] is a 50 y.o. female with PMHx of CAD s/p CABG in 2010, a-fib, HLD, DM, and hyperthyroidism who presents to the Emergency Department complaining of 5/10 in severity, constant, pressure-like, substernal chest pain with onset 3 days ago. This pain is similar in quality but less severe than pain that she has had in the past. Aspirin and NTG, that was past its expiration date, provided insufficient pain relief at home.  Pt states that she occasionally has had mild chest pain since her CABG but it does not typically last long. Additionally she has had intermittent exertional SOB since the CABG with no recent change. Associated symptoms include sweating and nausea.   Past Medical History  Diagnosis Date  . SINUS TACHYCARDIA 11/08/2010  . MIGRAINE W/O AURA W/INTRACT W/STATUS MIGRAINOSUS 02/19/2008  . HYPERLIPIDEMIA-MIXED   . CAD, ARTERY BYPASS GRAFT   . Atrial fibrillation ([facility])   . ANGINA, STABLE/EXERTIONAL   . ACUT MI ANTEROLAT WALL SUBSQT EPIS CARE   . URI   . TRIGGER FINGER   . Shortness of breath   . HYPERTHYROIDISM   . Edema   . DIZZINESS   . Diarrhea   . DIABETIC  RETINOPATHY   . DIABETES MELLITUS, TYPE I   . DERMATITIS, ALLERGIC   . CHOLELITHIASIS   . CHEST PAIN   . CARPAL TUNNEL SYNDROME, BILATERAL   . ARTHRITIS, RHEUMATOID   . ALLERGIC RHINITIS, SEASONAL   . Acute maxillary sinusitis    Past Surgical History  Procedure Laterality Date  . Coronary artery bypass graft    . Vitrectomy    . Caesarean section    . Carpal tunnel release     . Cholecystectomy     Family History  Problem Relation Age of Onset  . Diabetes    . Heart disease    . Hypertension    . Hyperlipidemia    . Depression    . Migraines    . Heart attack Neg Hx   . Stroke Paternal Grandmother    Social History  Substance Use Topics  . Smoking status: Never Smoker   . Smokeless tobacco: None  . Alcohol Use: No   OB History    No data available     Review of Systems  Constitutional: Positive for diaphoresis.  Respiratory: Positive for shortness of breath.   Cardiovascular: Positive for chest pain.  Gastrointestinal: Positive for nausea. Negative for vomiting.  All other systems reviewed and are negative.  Allergies  Prochlorperazine; Ramipril; Shellfish-derived products; Atorvastatin; Etanercept; Infliximab; Orencia; Shellfish allergy; Tofacitinib; Prochlorperazine edisylate; Amiodarone; and Rituximab  Home Medications   Prior to Admission medications   Medication Sig Start Date End Date Taking? Authorizing Provider  aspirin 81 MG tablet Take 81 mg by mouth daily.    Yes Historical Provider, MD  cyclobenzaprine (FLEXERIL) 10 MG tablet Take 10 mg by mouth at bedtime.  10/04/14  Yes Historical Provider, MD  EPINEPHrine 0.15 MG/0.15ML SOAJ Inject 0.15 mg into the skin as needed. Inject as directed.   Yes Historical  Provider, MD  furosemide (LASIX) 40 MG tablet Take 1 tablet (40 mg total) by mouth daily. 03/30/15  Yes Sherren Mocha, MD  Golimumab (SIMPONI) 100 MG/ML SOAJ Inject 100 mg into the skin every 30 (thirty) days.   Yes Historical Provider, MD  insulin lispro (HUMALOG) 100 UNIT/ML injection FOR USE IN INSULIN PUMP. TOTAL DAILY INSULIN DOSE = UP TO 90 UNITS. 03/24/15  Yes Historical Provider, MD  leflunomide (ARAVA) 20 MG tablet Take 20 mg by mouth daily.   Yes Historical Provider, MD  losartan (COZAAR) 50 MG tablet Take 1 tablet (50 mg total) by mouth daily. 03/30/15  Yes Sherren Mocha, MD  methimazole (TAPAZOLE) 5 MG tablet Take 2.5 mg  by mouth daily.    Yes Historical Provider, MD  metoprolol tartrate (LOPRESSOR) 25 MG tablet Take 1 tablet (25 mg total) by mouth 2 (two) times daily. 03/30/15  Yes Sherren Mocha, MD  nabumetone (RELAFEN) 500 MG tablet Take 500 mg by mouth 2 (two) times daily.    Yes Historical Provider, MD  Omega-3 Fatty Acids (FISH OIL) 1200 MG CAPS Take 1,200 mg by mouth 2 (two) times daily.   Yes Historical Provider, MD  potassium chloride (K-DUR) 10 MEQ tablet Take 1 tablet (10 mEq total) by mouth daily. 03/30/15  Yes Sherren Mocha, MD  rosuvastatin (CRESTOR) 10 MG tablet Take 1 tablet (10 mg total) by mouth daily. 03/30/15  Yes Sherren Mocha, MD  topiramate (TOPAMAX) 50 MG tablet Take 50 mg by mouth 2 (two) times daily.   Yes Historical Provider, MD  amoxicillin (AMOXIL) 875 MG tablet Take 1 tablet (875 mg total) by mouth 2 (two) times daily. (Rx void after 03/07/16) Patient not taking: Reported on 05/08/2016 02/27/16   Kandra Nicolas, MD  benzonatate (TESSALON) 200 MG capsule Take 1 capsule (200 mg total) by mouth at bedtime. Take as needed for cough Patient not taking: Reported on 05/08/2016 02/27/16   Kandra Nicolas, MD  potassium chloride (K-DUR,KLOR-CON) 10 MEQ tablet TAKE 1 TABLET BY MOUTH DAILY. Patient not taking: Reported on 05/08/2016 07/05/15   Sherren Mocha, MD   BP 133/75 mmHg  Pulse 88  Temp(Src) 98.5 F (36.9 C) (Oral)  Resp 18  SpO2 100% Physical Exam  Constitutional: She is oriented to person, place, and time. She appears well-developed and well-nourished. No distress.  HENT:  Head: Normocephalic and atraumatic.  Right Ear: Hearing normal.  Left Ear: Hearing normal.  Nose: Nose normal.  Mouth/Throat: Oropharynx is clear and moist and mucous membranes are normal.  Eyes: Conjunctivae and EOM are normal. Pupils are equal, round, and reactive to light.  Neck: Normal range of motion. Neck supple.  Cardiovascular: Regular rhythm, S1 normal and S2 normal.  Exam reveals no gallop and no friction  rub.   No murmur heard. Pulmonary/Chest: Effort normal and breath sounds normal. No respiratory distress. She exhibits no tenderness.  Abdominal: Soft. Normal appearance and bowel sounds are normal. There is no hepatosplenomegaly. There is no tenderness. There is no rebound, no guarding, no tenderness at McBurney's point and negative Murphy's sign. No hernia.  Musculoskeletal: Normal range of motion.  Neurological: She is alert and oriented to person, place, and time. She has normal strength. No cranial nerve deficit or sensory deficit. Coordination normal. GCS eye subscore is 4. GCS verbal subscore is 5. GCS motor subscore is 6.  Skin: Skin is warm, dry and intact. No rash noted. No cyanosis.  Psychiatric: She has a normal mood and affect. Her speech is  normal and behavior is normal. Thought content normal.  Nursing note and vitals reviewed.   ED Course  Procedures (including critical care time) DIAGNOSTIC STUDIES: Oxygen Saturation is 100% on RA,  normal by my interpretation.    COORDINATION OF CARE: 12:17 AM Discussed treatment plan which includes lab work, CXR, EKG with pt at bedside and pt agreed to plan.  Labs Review Labs Reviewed  BASIC METABOLIC PANEL - Abnormal; Notable for the following:    CO2 21 (*)    Glucose, Bld 181 (*)    All other components within normal limits  CBC  I-STAT TROPOININ, ED    Imaging Review Dg Chest 2 View  05/07/2016  CLINICAL DATA:  Midsternal chest pain extending into the back for 3 days. History of CABG in 2010. No acute injury. EXAM: CHEST  2 VIEW COMPARISON:  Radiographs 08/01/2009 and 07/04/2009. FINDINGS: The heart size and mediastinal contours are stable status post CABG. Multiple sternotomy wires have fractured since the prior studies. No evidence of significant sternal dehiscence or retrosternal abnormality. The lungs are clear. There is no pleural effusion or pneumothorax. No acute osseous findings are seen. IMPRESSION: No definite acute  findings. Several of the sternotomy wires have fractured since the 2010 studies, but there is no gross sternal dehiscence. Electronically Signed   By: Richardean Sale M.D.   On: 05/07/2016 19:25   I have personally reviewed and evaluated these images and lab results as part of my medical decision-making.   EKG Interpretation   Date/Time:  Monday May 07 2016 18:14:26 EDT Ventricular Rate:  87 PR Interval:  126 QRS Duration: 72 QT Interval:  374 QTC Calculation: 450 R Axis:   76 Text Interpretation:  Normal sinus rhythm Right atrial enlargement Low  voltage QRS RSR' or QR pattern in V1 suggests right ventricular conduction  delay Borderline ECG No significant change since last tracing Confirmed by  POLLINA  MD, CHRISTOPHER [id number]) on 05/08/2016 12:18:29 AM      MDM   Final diagnoses:  Chest pain, unspecified chest pain type   Patient presents to the emergency department for evaluation of chest pain. Patient does have a history of coronary artery disease, status post bypass 2010. Patient is experiencing anterior chest heaviness and tightness that is similar to what she had when she had an inferior MI in 2010. Patient does not have any changes on EKG and initial troponin is negative. Discussed with cardiology, will observe in the hospital.  I personally performed the services described in this documentation, which was scribed in my presence. The recorded information has been reviewed and is accurate.    Orpah Greek, MD 05/08/16 [phone]

## 2016-05-09 ENCOUNTER — Encounter (HOSPITAL_COMMUNITY): Payer: Self-pay | Admitting: Cardiovascular Disease

## 2016-05-09 DIAGNOSIS — E782 Mixed hyperlipidemia: Secondary | ICD-10-CM | POA: Diagnosis not present

## 2016-05-09 DIAGNOSIS — I10 Essential (primary) hypertension: Secondary | ICD-10-CM | POA: Diagnosis not present

## 2016-05-09 DIAGNOSIS — I25700 Atherosclerosis of coronary artery bypass graft(s), unspecified, with unstable angina pectoris: Secondary | ICD-10-CM | POA: Diagnosis not present

## 2016-05-09 DIAGNOSIS — I25719 Atherosclerosis of autologous vein coronary artery bypass graft(s) with unspecified angina pectoris: Secondary | ICD-10-CM | POA: Diagnosis not present

## 2016-05-09 DIAGNOSIS — Z955 Presence of coronary angioplasty implant and graft: Secondary | ICD-10-CM | POA: Diagnosis not present

## 2016-05-09 DIAGNOSIS — I25119 Atherosclerotic heart disease of native coronary artery with unspecified angina pectoris: Secondary | ICD-10-CM | POA: Diagnosis not present

## 2016-05-09 DIAGNOSIS — Z91041 Radiographic dye allergy status: Secondary | ICD-10-CM | POA: Diagnosis not present

## 2016-05-09 DIAGNOSIS — M069 Rheumatoid arthritis, unspecified: Secondary | ICD-10-CM | POA: Diagnosis not present

## 2016-05-09 DIAGNOSIS — R079 Chest pain, unspecified: Secondary | ICD-10-CM | POA: Diagnosis not present

## 2016-05-09 DIAGNOSIS — G43009 Migraine without aura, not intractable, without status migrainosus: Secondary | ICD-10-CM | POA: Diagnosis not present

## 2016-05-09 DIAGNOSIS — I252 Old myocardial infarction: Secondary | ICD-10-CM | POA: Diagnosis not present

## 2016-05-09 LAB — CBC
HEMATOCRIT: 36.7 % (ref 36.0–46.0)
HEMOGLOBIN: 12.5 g/dL (ref 12.0–15.0)
MCH: 29.3 pg (ref 26.0–34.0)
MCHC: 34.1 g/dL (ref 30.0–36.0)
MCV: 85.9 fL (ref 78.0–100.0)
Platelets: 192 10*3/uL (ref 150–400)
RBC: 4.27 MIL/uL (ref 3.87–5.11)
RDW: 11.8 % (ref 11.5–15.5)
WBC: 7 10*3/uL (ref 4.0–10.5)

## 2016-05-09 LAB — BASIC METABOLIC PANEL
ANION GAP: 10 (ref 5–15)
BUN: 13 mg/dL (ref 6–20)
CALCIUM: 8.6 mg/dL — AB (ref 8.9–10.3)
CO2: 21 mmol/L — AB (ref 22–32)
Chloride: 108 mmol/L (ref 101–111)
Creatinine, Ser: 0.67 mg/dL (ref 0.44–1.00)
GFR calc non Af Amer: >60 mL/min (ref >60)
Glucose, Bld: 313 mg/dL — ABNORMAL HIGH (ref 65–99)
POTASSIUM: 4.2 mmol/L (ref 3.5–5.1)
Sodium: 139 mmol/L (ref 135–145)

## 2016-05-09 LAB — HEMOGLOBIN A1C
Hgb A1c MFr Bld: 6.4 % — ABNORMAL HIGH (ref 4.8–5.6)
Mean Plasma Glucose: 137 mg/dL

## 2016-05-09 LAB — GLUCOSE, CAPILLARY: Glucose-Capillary: 295 mg/dL — ABNORMAL HIGH (ref 65–99)

## 2016-05-09 MED ORDER — PANTOPRAZOLE SODIUM 40 MG PO TBEC: 40.0000 mg | DELAYED_RELEASE_TABLET | Freq: Every day | ORAL | Status: DC

## 2016-05-09 MED ORDER — ISOSORBIDE MONONITRATE ER 30 MG PO TB24: 15.0000 mg | ORAL_TABLET | Freq: Every day | ORAL | Status: DC

## 2016-05-09 MED ORDER — CLOPIDOGREL BISULFATE 75 MG PO TABS: 75.0000 mg | ORAL_TABLET | Freq: Every day | ORAL | Status: DC

## 2016-05-09 MED ORDER — CLOPIDOGREL BISULFATE 75 MG PO TABS
600.0000 mg | ORAL_TABLET | Freq: Once | ORAL | Status: AC
  Administered 2016-05-09: 600 mg via ORAL
  Filled 2016-05-09: qty 8

## 2016-05-09 MED FILL — CLOPIDOGREL 75 MG TABLET: 75 | 90 days supply | Qty: 90 | Fill #0

## 2016-05-09 MED FILL — SIMPONI 100 MG/ML SOAJ: 100 | 30 days supply | Qty: 1 | Fill #2

## 2016-05-09 MED FILL — ISOSORBIDE MN ER 30 MG TAB: 30 | 60 days supply | Qty: 30 | Fill #0

## 2016-05-09 MED FILL — PANTOPRAZOLE SOD DR 40 MG T: 40 | 90 days supply | Qty: 90 | Fill #0

## 2016-05-09 NOTE — Discharge Instructions (Signed)
No driving for 24 hours. No lifting over 5 lbs for 1 week. No sexual activity for 1 week. Keep procedure site clean & dry. If you notice increased pain, swelling, bleeding or pus, call/return!  You may shower, but no soaking baths/hot tubs/pools for 1 week.  ° ° °

## 2016-05-09 NOTE — Discharge Summary (Signed)
Discharge Summary    Patient ID: [name],  MRN: [id number], DOB/AGE: 1966-06-30 50 y.o.  Admit date: 05/07/2016 Discharge date: 05/09/2016  Primary Care Provider: Dionicia Abler Primary Cardiologist: Dr. Burt Knack  Discharge Diagnoses    Principal Problem:   Chest pain syndrome Active Problems:   MIGRAINE W/O AURA W/INTRACT W/STATUS MIGRAINOSUS   CAD, ARTERY BYPASS GRAFT   Hypertension, essential   Hyperlipidemia, mixed   Diabetes mellitus with no complication ([employer])   Coronary artery disease involving native coronary artery of native heart with angina pectoris ([facility])   Allergies Allergies  Allergen Reactions  . Prochlorperazine Rash    Neuro problems per pt Neurological reaction  . Ramipril Swelling, Rash and Other (See Comments)  . Shellfish-Derived Products Swelling    Shrimp(Facial swelling) Shrimp(Facial swelling)  . Atorvastatin Rash    Elevated LFT's Elevated LFT's Elevated LFT's  . Etanercept Swelling and Rash  . Infliximab Rash  . Orencia [Abatacept] Rash  . Shellfish Allergy Swelling    Shrimp(Facial swelling)  . Tofacitinib Rash and Other (See Comments)    Severe abdominal pain Severe abdominal pain Severe abdominal pain  . Prochlorperazine Edisylate   . Amiodarone Nausea Only  . Rituximab Rash    Causes a rash    Diagnostic Studies/Procedures    Cardiac cath 05/08/2016 Conclusion    1. Severe double vessel CAD s/p 3V CABG 2. The LAD has diffuse severe proximal stenosis. There is both antegrade flow down the LAD and retrograde filling from the patent LIMA graft. The Diagonal that was supplied by the vein graft is small in caliber. The vein graft to the diagonal is occluded.  3. The Circumflex is small in caliber and has mild plaque disease. The intermediate branch is large in caliber. The intermediate branch has a proximal 30% stenosis, slightly hypodense in appearance but with excellent flow down the vessel. (FFR of this vessel is 0.91  with infusion of adenosine suggesting no significant flow obstruction). It is unclear if this represents a non-flow limiting plaque rupture or hypodense calcific lesion.  4. The RCA is previously stented in the mid segment. The mid stented segment is completely occluded. The vein graft to the PDA and posterolateral branches is patent.   Recommendations: She presented with continuous chest pain for 48 hours with no objective evidence of ischemia (negative troponin, no ischemic EKG changes). No culprit lesion is noted. As above, the mildly hypodense plaque in the intermediate branch could represent plaque rupture without flow obstruction but FFR is in a normal range. I do not think this lesion is causing her chest pain given the excellent flow down the vessel. I will treat with ASA, Plavix in case of plaque rupture and will start Imdur. Consider GI related cause of her chest pain.     _____________   History of Present Illness     This is a 50 y.o. female with inferior MI s/p DES to RCA in 2010 and then LAD occlusion s/p CABG in 2010 by Dr. Servando Snare, DM type I, postmenopausal, HTN, HLD and RA who presented with chief complaint of chest pain.  Patient was in her usual state of healthy until 3 days ago when she developed sudden onset chest pain at rest. This chest pain was present in an orange sized area in the lower part of the sternal region without any radiation to arm, 5/10 in intensity. She tried to wait and did not initially seek medical assistance but on Sunday it became  constant. She came to work yesterday with the pain. The pain never went away. She took 2 NTG today and called Dr. Burt Knack at which time, she wasa told to go to the ED. She picked her children up from the school and came to the ED where she had negative EKG and initial troponin.  Of note, patient has prior history of several episodes of these similar chest pains. This chest pain is similar to her pre CABG chest pain but that pain  also radiated down her left arm. Patient also has back pain on the left side in the muscle and there is muscle tightness in that area. She also has ongoing migraine for the last 2-3 days with nausea as well. This pain gets worse with deep breathing and changes with food intake. She has not had an EGD done recently.  She also has been having exertional dyspnea lately. She works as a Therapist, sports in [employer] and did her own EKG today but did not find anything wrong. In the past she had 2 stress test for similar atypical chest pain, one in 2011 and the other in 2014. Myoview in 2014 for atypical chest pain was negative for ischemia.  Recent echocardiogram did not show any significant abnormality.  EKG is unchanged.  Troponin is negative.   Hospital Course     Patient was admitted to cardiology service, given the fact that her chest discomfort was similar to the previous angina, the plan was to undergo diagnostic cardiac catheterization. According to the previous operative report on 06/07/2009, she underwent CABG 3 with LIMA to LAD, reverse SVG to D1, reverse SVG to distal RCA. According to the patient, she also had shellfish and contrast dye allergy with hives as presenting symptom. She was pretreated with steroids, Pepcid, and Benadryl. She underwent cardiac catheterization on 05/08/2016, there was a slightly hypodense region in the intermediate branch, however she had excellent flow, FFR was normal. Vein graft to PDA and the posterolateral branch is patent, patent LIMA to LAD, occluded SVG to diagonal. There was no culprit lesion found during cardiac cath. It was felt that her chest pain is likely noncardiac in origin, and should consider GI related cause for chest pain. Given the hypodense region found during cath, Plavix was started in case of plaque rupture. She was also started on Imdur.  Patient was seen in the morning of 05/09/2016, she is deemed stable for discharge from cardiology perspective if  ambulate ok. I have arranged 2-4 weeks follow-up. New medications during this admission including Plavix, protonix, and Imdur.  _____________  Discharge Vitals Blood pressure 106/58, pulse 70, temperature 97.8 F (36.6 C), temperature source Oral, resp. rate 17, weight 169 lb 8 oz (76.885 kg), SpO2 98 %.  Filed Weights   05/08/16 0353  Weight: 169 lb 8 oz (76.885 kg)    Labs & Radiologic Studies     CBC  Recent Labs  05/08/16 0412 05/09/16 0401  WBC 5.8 7.0  HGB 13.4 12.5  HCT 38.8 36.7  MCV 86.8 85.9  PLT 185 [id number]   Basic Metabolic Panel  Recent Labs  05/07/16 1829 05/08/16 0412 05/09/16 0401  NA 141  --  139  K 4.0  --  4.2  CL 108  --  108  CO2 21*  --  21*  GLUCOSE 181*  --  313*  BUN 13  --  13  CREATININE 0.84 0.77 0.67  CALCIUM 9.2  --  8.6*   Cardiac Enzymes  Recent  Labs  05/08/16 0412 05/08/16 0933 05/08/16 1605  TROPONINI <0.03 <0.03 <0.03   Hemoglobin A1C  Recent Labs  05/08/16 0412  HGBA1C 6.4*   Fasting Lipid Panel  Recent Labs  05/08/16 0412  CHOL 151  HDL 69  LDLCALC 62  TRIG 101  CHOLHDL 2.2   Thyroid Function Tests  Recent Labs  05/08/16 0412  TSH 3.666    Dg Chest 2 View  05/07/2016  CLINICAL DATA:  Midsternal chest pain extending into the back for 3 days. History of CABG in 2010. No acute injury. EXAM: CHEST  2 VIEW COMPARISON:  Radiographs 08/01/2009 and 07/04/2009. FINDINGS: The heart size and mediastinal contours are stable status post CABG. Multiple sternotomy wires have fractured since the prior studies. No evidence of significant sternal dehiscence or retrosternal abnormality. The lungs are clear. There is no pleural effusion or pneumothorax. No acute osseous findings are seen. IMPRESSION: No definite acute findings. Several of the sternotomy wires have fractured since the 2010 studies, but there is no gross sternal dehiscence. Electronically Signed   By: Richardean Sale M.D.   On: 05/07/2016 19:25    Disposition     Pt is being discharged home today in good condition.  Follow-up Plans & Appointments    Follow-up Information    Follow up with Charlie Pitter, PA-C On 05/30/2016.   Specialties:  Cardiology, Radiology   Why:  2:00PM. Cardiology followup   Contact information:   [address] [place] [address] [phone]        Discharge Medications   Current Discharge Medication List    START taking these medications   Details  clopidogrel (PLAVIX) 75 MG tablet Take 1 tablet (75 mg total) by mouth daily. Qty: 90 tablet, Refills: 3    isosorbide mononitrate (IMDUR) 30 MG 24 hr tablet Take 0.5 tablets (15 mg total) by mouth daily. Qty: 30 tablet, Refills: 5    pantoprazole (PROTONIX) 40 MG tablet Take 1 tablet (40 mg total) by mouth daily. Qty: 90 tablet, Refills: 3      CONTINUE these medications which have NOT CHANGED   Details  aspirin 81 MG tablet Take 81 mg by mouth daily.     cyclobenzaprine (FLEXERIL) 10 MG tablet Take 10 mg by mouth at bedtime.     EPINEPHrine 0.15 MG/0.15ML SOAJ Inject 0.15 mg into the skin as needed. Inject as directed.    furosemide (LASIX) 40 MG tablet Take 1 tablet (40 mg total) by mouth daily. Qty: 90 tablet, Refills: 3   Associated Diagnoses: Edema; Essential hypertension    Golimumab (SIMPONI) 100 MG/ML SOAJ Inject 100 mg into the skin every 30 (thirty) days.    insulin lispro (HUMALOG) 100 UNIT/ML injection FOR USE IN INSULIN PUMP. TOTAL DAILY INSULIN DOSE = UP TO 90 UNITS.    leflunomide (ARAVA) 20 MG tablet Take 20 mg by mouth daily.    losartan (COZAAR) 50 MG tablet Take 1 tablet (50 mg total) by mouth daily. Qty: 90 tablet, Refills: 3   Associated Diagnoses: Essential hypertension    methimazole (TAPAZOLE) 5 MG tablet Take 2.5 mg by mouth daily.     metoprolol tartrate (LOPRESSOR) 25 MG tablet Take 1 tablet (25 mg total) by mouth 2 (two) times daily. Qty: 180 tablet, Refills: 3   Associated Diagnoses: Essential  hypertension    nabumetone (RELAFEN) 500 MG tablet Take 500 mg by mouth 2 (two) times daily.     Omega-3 Fatty Acids (FISH OIL) 1200  MG CAPS Take 1,200 mg by mouth 2 (two) times daily.    potassium chloride (K-DUR) 10 MEQ tablet Take 1 tablet (10 mEq total) by mouth daily. Qty: 90 tablet, Refills: 3   Associated Diagnoses: Edema; Essential hypertension    rosuvastatin (CRESTOR) 10 MG tablet Take 1 tablet (10 mg total) by mouth daily. Qty: 90 tablet, Refills: 3   Associated Diagnoses: Hyperlipemia    topiramate (TOPAMAX) 50 MG tablet Take 50 mg by mouth 2 (two) times daily.      STOP taking these medications     amoxicillin (AMOXIL) 875 MG tablet      benzonatate (TESSALON) 200 MG capsule      potassium chloride (K-DUR,KLOR-CON) 10 MEQ tablet            Outstanding Labs/Studies   None  Duration of Discharge Encounter   Greater than 30 minutes including physician time.  Hilbert Corrigan PA-C 05/09/2016, 12:42 PM

## 2016-05-09 NOTE — Progress Notes (Signed)
05/09/2016 3:01 PM Discharge AVS meds taken today and those due this evening reviewed.  Follow-up appointments and when to call md reviewed.  D/C IV and TELE.  Questions and concerns addressed.   D/C home per orders. [place]

## 2016-05-09 NOTE — Progress Notes (Signed)
Subjective:  CP improved. POD 3! Cath, Left radial  Objective:  Temp:  [97.8 F (36.6 C)-98.3 F (36.8 C)] 97.8 F (36.6 C) (05/10 0327) Pulse Rate:  [0-101] 70 (05/10 1023) Resp:  [0-20] 17 (05/10 0327) BP: (106-149)/(47-100) 106/58 mmHg (05/10 1023) SpO2:  [0 %-100 %] 98 % (05/10 0327) Weight change:   Intake/Output from previous day: 05/09 0701 - 05/10 0700 In: 440 [I.V.:440] Out: -   Intake/Output from this shift: Total I/O In: 123 [P.O.:120; I.V.:3] Out: -   Physical Exam: General appearance: alert and no distress Neck: no adenopathy, no carotid bruit, no JVD, supple, symmetrical, trachea midline and thyroid not enlarged, symmetric, no tenderness/mass/nodules Lungs: clear to auscultation bilaterally Heart: regular rate and rhythm, S1, S2 normal, no murmur, click, rub or gallop Extremities: extremities normal, atraumatic, no cyanosis or edema  Lab Results: Results for orders placed or performed during the hospital encounter of 05/07/16 (from the past 48 hour(s))  Basic metabolic panel     Status: Abnormal   Collection Time: 05/07/16  6:29 PM  Result Value Ref Range   Sodium 141 135 - 145 mmol/L   Potassium 4.0 3.5 - 5.1 mmol/L   Chloride 108 101 - 111 mmol/L   CO2 21 (L) 22 - 32 mmol/L   Glucose, Bld 181 (H) 65 - 99 mg/dL   BUN 13 6 - 20 mg/dL   Creatinine, Ser 0.84 0.44 - 1.00 mg/dL   Calcium 9.2 8.9 - 10.3 mg/dL   GFR calc non Af Amer >60 >60 mL/min   GFR calc Af Amer >60 >60 mL/min    Comment: (NOTE) The eGFR has been calculated using the CKD EPI equation. This calculation has not been validated in all clinical situations. eGFR's persistently <60 mL/min signify possible Chronic Kidney Disease.    Anion gap 12 5 - 15  CBC     Status: None   Collection Time: 05/07/16  6:29 PM  Result Value Ref Range   WBC 5.7 4.0 - 10.5 K/uL   RBC 4.80 3.87 - 5.11 MIL/uL   Hemoglobin 14.4 12.0 - 15.0 g/dL   HCT 42.0 36.0 - 46.0 %   MCV 87.5 78.0 - 100.0 fL   MCH 30.0 26.0 - 34.0 pg   MCHC 34.3 30.0 - 36.0 g/dL   RDW 12.0 11.5 - 15.5 %   Platelets 177 150 - 400 K/uL  I-stat troponin, ED     Status: None   Collection Time: 05/07/16  6:46 PM  Result Value Ref Range   Troponin i, poc 0.00 0.00 - 0.08 ng/mL   Comment 3            Comment: Due to the release kinetics of cTnI, a negative result within the first hours of the onset of symptoms does not rule out myocardial infarction with certainty. If myocardial infarction is still suspected, repeat the test at appropriate intervals.   CBC     Status: None   Collection Time: 05/08/16  4:12 AM  Result Value Ref Range   WBC 5.8 4.0 - 10.5 K/uL   RBC 4.47 3.87 - 5.11 MIL/uL   Hemoglobin 13.4 12.0 - 15.0 g/dL   HCT 38.8 36.0 - 46.0 %   MCV 86.8 78.0 - 100.0 fL   MCH 30.0 26.0 - 34.0 pg   MCHC 34.5 30.0 - 36.0 g/dL   RDW 12.1 11.5 - 15.5 %   Platelets 185 150 - 400 K/uL  Creatinine, serum  Status: None   Collection Time: 05/08/16  4:12 AM  Result Value Ref Range   Creatinine, Ser 0.77 0.44 - 1.00 mg/dL   GFR calc non Af Amer >60 >60 mL/min   GFR calc Af Amer >60 >60 mL/min    Comment: (NOTE) The eGFR has been calculated using the CKD EPI equation. This calculation has not been validated in all clinical situations. eGFR's persistently <60 mL/min signify possible Chronic Kidney Disease.   TSH     Status: None   Collection Time: 05/08/16  4:12 AM  Result Value Ref Range   TSH 3.666 0.350 - 4.500 uIU/mL  Brain natriuretic peptide     Status: None   Collection Time: 05/08/16  4:12 AM  Result Value Ref Range   B Natriuretic Peptide 7.4 0.0 - 100.0 pg/mL  Troponin I     Status: None   Collection Time: 05/08/16  4:12 AM  Result Value Ref Range   Troponin I <0.03 <0.031 ng/mL    Comment:        NO INDICATION OF MYOCARDIAL INJURY.   Hemoglobin A1c     Status: Abnormal   Collection Time: 05/08/16  4:12 AM  Result Value Ref Range   Hgb A1c MFr Bld 6.4 (H) 4.8 - 5.6 %    Comment:  (NOTE)         Pre-diabetes: 5.7 - 6.4         Diabetes: >6.4         Glycemic control for adults with diabetes: <7.0    Mean Plasma Glucose 137 mg/dL    Comment: (NOTE) Performed At: [facility] [place], [place] [id number] Lindon Romp MD [id number]   Lipid panel     Status: None   Collection Time: 05/08/16  4:12 AM  Result Value Ref Range   Cholesterol 151 0 - 200 mg/dL   Triglycerides 101 <150 mg/dL   HDL 69 >40 mg/dL   Total CHOL/HDL Ratio 2.2 RATIO   VLDL 20 0 - 40 mg/dL   LDL Cholesterol 62 0 - 99 mg/dL    Comment:        Total Cholesterol/HDL:CHD Risk Coronary Heart Disease Risk Table                     Men   Women  1/2 Average Risk   3.4   3.3  Average Risk       5.0   4.4  2 X Average Risk   9.6   7.1  3 X Average Risk  23.4   11.0        Use the calculated Patient Ratio above and the CHD Risk Table to determine the patient's CHD Risk.        ATP III CLASSIFICATION (LDL):  <100     mg/dL   Optimal  100-129  mg/dL   Near or Above                    Optimal  130-159  mg/dL   Borderline  160-189  mg/dL   High  >190     mg/dL   Very High   Glucose, capillary     Status: Abnormal   Collection Time: 05/08/16  6:22 AM  Result Value Ref Range   Glucose-Capillary 139 (H) 65 - 99 mg/dL  Troponin I     Status: None   Collection Time: 05/08/16  9:33 AM  Result Value Ref Range  Troponin I <0.03 <0.031 ng/mL    Comment:        NO INDICATION OF MYOCARDIAL INJURY.   Protime-INR     Status: None   Collection Time: 05/08/16 11:09 AM  Result Value Ref Range   Prothrombin Time 14.5 11.6 - 15.2 seconds   INR 1.11 0.00 - 1.49  Glucose, capillary     Status: Abnormal   Collection Time: 05/08/16 11:16 AM  Result Value Ref Range   Glucose-Capillary 302 (H) 65 - 99 mg/dL   Comment 1 Notify RN    Comment 2 Document in Chart   POCT Activated clotting time     Status: None   Collection Time: 05/08/16  3:16 PM  Result Value Ref Range    Activated Clotting Time 544 seconds  Glucose, capillary     Status: Abnormal   Collection Time: 05/08/16  4:02 PM  Result Value Ref Range   Glucose-Capillary 118 (H) 65 - 99 mg/dL  Troponin I     Status: None   Collection Time: 05/08/16  4:05 PM  Result Value Ref Range   Troponin I <0.03 <0.031 ng/mL    Comment:        NO INDICATION OF MYOCARDIAL INJURY.   Glucose, capillary     Status: Abnormal   Collection Time: 05/08/16  4:55 PM  Result Value Ref Range   Glucose-Capillary 101 (H) 65 - 99 mg/dL   Comment 1 Notify RN    Comment 2 Document in Chart   Glucose, capillary     Status: Abnormal   Collection Time: 05/08/16  8:19 PM  Result Value Ref Range   Glucose-Capillary 139 (H) 65 - 99 mg/dL   Comment 1 Notify RN    Comment 2 Document in Chart   CBC     Status: None   Collection Time: 05/09/16  4:01 AM  Result Value Ref Range   WBC 7.0 4.0 - 10.5 K/uL   RBC 4.27 3.87 - 5.11 MIL/uL   Hemoglobin 12.5 12.0 - 15.0 g/dL   HCT 36.7 36.0 - 46.0 %   MCV 85.9 78.0 - 100.0 fL   MCH 29.3 26.0 - 34.0 pg   MCHC 34.1 30.0 - 36.0 g/dL   RDW 11.8 11.5 - 15.5 %   Platelets 192 150 - 400 K/uL  Basic metabolic panel     Status: Abnormal   Collection Time: 05/09/16  4:01 AM  Result Value Ref Range   Sodium 139 135 - 145 mmol/L   Potassium 4.2 3.5 - 5.1 mmol/L   Chloride 108 101 - 111 mmol/L   CO2 21 (L) 22 - 32 mmol/L   Glucose, Bld 313 (H) 65 - 99 mg/dL   BUN 13 6 - 20 mg/dL   Creatinine, Ser 0.67 0.44 - 1.00 mg/dL   Calcium 8.6 (L) 8.9 - 10.3 mg/dL   GFR calc non Af Amer >60 >60 mL/min   GFR calc Af Amer >60 >60 mL/min    Comment: (NOTE) The eGFR has been calculated using the CKD EPI equation. This calculation has not been validated in all clinical situations. eGFR's persistently <60 mL/min signify possible Chronic Kidney Disease.    Anion gap 10 5 - 15  Glucose, capillary     Status: Abnormal   Collection Time: 05/09/16  6:27 AM  Result Value Ref Range   Glucose-Capillary  295 (H) 65 - 99 mg/dL   Comment 1 Notify RN    Comment 2 Document in Chart  Imaging: Imaging results have been reviewed  Tele--NSR  Assessment/Plan:   1. Principal Problem: 2.   Chest pain syndrome 3. Active Problems: 4.   MIGRAINE W/O AURA W/INTRACT W/STATUS MIGRAINOSUS 5.   CAD, ARTERY BYPASS GRAFT 6.   Hypertension, essential 7.   Hyperlipidemia, mixed 8.   Diabetes mellitus with no complication (HCC) 9.   Chest pain with moderate risk of acute coronary syndrome 10.   Coronary artery disease involving native coronary artery of native heart with angina pectoris ([place]) 11.   Time Spent Directly with Patient:  20 minutes  Length of Stay:   Pt admitted with [place]. Enz neg. EKG w/o  Acute changes. Results of cath noted. No culprit lesions. Grafts patent. CP Sx have improved. Exam benign. OK for DC home this afternoon once ambulates on PPI. ROV with Dr. Burt Knack.   Quay Burow 05/09/2016, 10:57 AM

## 2016-05-10 MED FILL — FOLIC ACID 1 MG TABLET: 1 | 90 days supply | Qty: 180 | Fill #0

## 2016-05-11 ENCOUNTER — Other Ambulatory Visit: Payer: Self-pay

## 2016-05-11 DIAGNOSIS — E785 Hyperlipidemia, unspecified: Secondary | ICD-10-CM

## 2016-05-11 MED ORDER — ROSUVASTATIN CALCIUM 10 MG PO TABS: 10.0000 mg | ORAL_TABLET | Freq: Every day | ORAL | Status: DC

## 2016-05-11 MED FILL — ROSUVASTATIN CALCIUM 10 MG: 10 | 90 days supply | Qty: 90 | Fill #0

## 2016-05-11 NOTE — Telephone Encounter (Signed)
Sherren Mocha, MD at 03/30/2015 4:21 PM  rosuvastatin (CRESTOR) 10 MG tabletTake 1 tablet (10 mg total) by mouth daily Current medicines are reviewed with the patient today. The patient does not have concerns regarding medicines.  The following changes have been made: no change

## 2016-05-15 ENCOUNTER — Telehealth: Payer: Self-pay | Admitting: Cardiovascular Disease

## 2016-05-15 MED ORDER — CEPHALEXIN 500 MG PO CAPS: 500.0000 mg | ORAL_CAPSULE | Freq: Three times a day (TID) | ORAL | Status: DC

## 2016-05-15 NOTE — Telephone Encounter (Signed)
I spoke with the pt and she feels better and her energy level has increased since starting Imdur.  The pt said she cannot take Pantoprazole due to nausea (med list updated). The pt called the office because she thinks her cardiac catheterization site is becoming infected (left radial artery). The area is red and "puffy" and has clear drainage coming from the site at this time.  It does not feel warm to touch.  The pt denies allergy to PCN and specific antibiotics.  The pt said she is already immunocomprised due to her RA medications. I made the pt aware that I will touch base with Dr Burt Knack in regards to her concerns.  If a return phone call is made tomorrow then the pt is okay with a voicemail message being left since she will be working in the [employer].

## 2016-05-15 NOTE — Telephone Encounter (Signed)
New message      Pt is concerned the site where the pt had a card. Cath done last week red and raised, the pt was wanting to know what to do.

## 2016-05-15 NOTE — Telephone Encounter (Signed)
I spoke with Dr Burt Knack and he would like the pt to start Keflex 500mg  one capsule by mouth three times a day for 7 days. Rx sent to the pharmacy and pt aware.  She will contact the office if her wrist site does not improve or she develops new symptoms.

## 2016-05-16 MED FILL — CEPHALEXIN 500 MG CAPSULE: 500 | 7 days supply | Qty: 21 | Fill #0

## 2016-05-29 ENCOUNTER — Encounter: Payer: Self-pay | Admitting: Physician Assistant

## 2016-05-29 NOTE — Progress Notes (Signed)
Cardiology Office Note    Date:  05/30/2016  ID:  [name], [name] [birth year], MRN [id number] PCP:  Dionicia Abler, MD  Cardiologist:  Burt Knack   Chief Complaint: f/u cath and chest pain  History of Present Illness:  [name] is a 50 y.o. female with history of CAD (inferior MI s/p DES to RCA in 2010 and then LAD occlusion s/p CABG in 2010 by Dr. Servando Snare, CP with possible hypodense area in intermediate branch 04/2016), last EF 65-70% in 2015, DM type I, postmenopausal, HLD, contrast dye allergy, and RA who presents for post-hospital follow-up. She was admitted 04/2016 with recurrent CP with negative troponin & underwent cath showing slightly hypodense region in the intermediate branch, however she had excellent flow, FFR was normal. Vein graft to PDA and the posterolateral branch is patent, patent LIMA to LAD, occluded SVG to diagonal. There was no culprit lesion found during cardiac cath. It was felt that her chest pain is likely noncardiac in origin, and should consider GI related cause for chest pain. BNP was normal. Given the hypodense region found during cath, Plavix was started in case of plaque rupture. She was also started on Imdur. She called in 5/16 reporting she could not take pantoprazole due to nausea, but said she felt better on Imdur. She had redness at her cath site afterwards and Keflex was called in.  She comes in today for follow-up feeling well. She has felt so much better since starting Imdur. She says she wishes she had this 7 years ago because of the increased energy it has given her. Chest pain has subsided. No SOB. 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  Past Medical History  Diagnosis Date  . SINUS TACHYCARDIA 11/08/2010  . MIGRAINE W/O AURA W/INTRACT W/STATUS MIGRAINOSUS 02/19/2008  . HYPERLIPIDEMIA-MIXED   . CAD, ARTERY BYPASS GRAFT     a. DES to RCA in 2010 then LAD occlusion s/p CABG 3  06/07/2009 with LIMA to LAD, reverse SVG to D1, reverse SVG to distal RCA. b. Cath 05/08/2016 slightly hypodense region in the intermediate branch, however she had excellent flow, FFR was normal. Vein graft to PDA and the posterolateral branch is patent, patent LIMA to LAD, occluded SVG to diagonal.  . Atrial fibrillation ([place])     a. after CABG.  Marland Kitchen ACUT MI ANTEROLAT WALL SUBSQT EPIS CARE   . URI   . TRIGGER FINGER   . HYPERTHYROIDISM   . DIABETIC  RETINOPATHY   . DIABETES MELLITUS, TYPE I     on insulin pump  . DERMATITIS, ALLERGIC   . CHOLELITHIASIS   . CARPAL TUNNEL SYNDROME, BILATERAL   . ARTHRITIS, RHEUMATOID   . ALLERGIC RHINITIS, SEASONAL   . Acute maxillary sinusitis   . Insulin pump in place   . Contrast media allergy     Past Surgical History  Procedure Laterality Date  . Coronary artery bypass graft    . Vitrectomy    . Caesarean section    . Carpal tunnel release    . Cholecystectomy    . Cardiac catheterization N/A 05/08/2016    Procedure: Left Heart Cath and Cors/Grafts Angiography;  Surgeon: Burnell Blanks, MD;  Location: [facility];  Service: Cardiovascular;  Laterality: N/A;    Current Medications: Outpatient Prescriptions Prior to Visit  Medication Sig Dispense Refill  . aspirin 81 MG tablet Take 81 mg  by mouth daily.     . clopidogrel (PLAVIX) 75 MG tablet Take 1 tablet (75 mg total) by mouth daily. 90 tablet 3  . cyclobenzaprine (FLEXERIL) 10 MG tablet Take 10 mg by mouth at bedtime.     Marland Kitchen EPINEPHrine 0.15 MG/0.15ML SOAJ Inject 0.15 mg into the skin as needed. Inject as directed.    . Golimumab (SIMPONI) 100 MG/ML SOAJ Inject 100 mg into the skin every 30 (thirty) days.    . insulin lispro (HUMALOG) 100 UNIT/ML injection FOR USE IN INSULIN PUMP. TOTAL DAILY INSULIN DOSE = UP TO 90 UNITS.    Marland Kitchen isosorbide mononitrate (IMDUR) 30 MG 24 hr tablet Take 0.5 tablets (15 mg total) by mouth daily. 30 tablet 5  . leflunomide (ARAVA) 20 MG tablet Take 20 mg  by mouth daily.    Marland Kitchen losartan (COZAAR) 50 MG tablet Take 1 tablet (50 mg total) by mouth daily. 90 tablet 3  . metoprolol tartrate (LOPRESSOR) 25 MG tablet Take 1 tablet (25 mg total) by mouth 2 (two) times daily. 180 tablet 3  . nabumetone (RELAFEN) 500 MG tablet Take 500 mg by mouth 2 (two) times daily.     . Omega-3 Fatty Acids (FISH OIL) 1200 MG CAPS Take 1,200 mg by mouth 2 (two) times daily.    . potassium chloride (K-DUR) 10 MEQ tablet Take 1 tablet (10 mEq total) by mouth daily. 90 tablet 3  . rosuvastatin (CRESTOR) 10 MG tablet Take 1 tablet (10 mg total) by mouth daily. 90 tablet 1  . topiramate (TOPAMAX) 50 MG tablet Take 50 mg by mouth 2 (two) times daily.    . cephALEXin (KEFLEX) 500 MG capsule Take 1 capsule (500 mg total) by mouth 3 (three) times daily. 21 capsule 0  . furosemide (LASIX) 40 MG tablet Take 1 tablet (40 mg total) by mouth daily. 90 tablet 3  . methimazole (TAPAZOLE) 5 MG tablet Take 2.5 mg by mouth daily.      No facility-administered medications prior to visit.     Allergies:   Prochlorperazine; Ramipril; Shellfish-derived products; Atorvastatin; Etanercept; Infliximab; Orencia; Shellfish allergy; Tofacitinib; Prochlorperazine edisylate; Amiodarone; and Rituximab   Social History   Social History  . Marital Status: Legally Separated    Spouse Name: N/A  . Number of Children: N/A  . Years of Education: N/A   Social History Main Topics  . Smoking status: Never Smoker   . Smokeless tobacco: Never Used  . Alcohol Use: No  . Drug Use: No  . Sexual Activity: Not Asked   Other Topics Concern  . None   Social History Narrative   Married to [employer]. Has 2 kids(fraternal twins) age 82. 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 Never smoked, denies ETOH, no drugs. Drinks diet coke and 4 cups of coffee daily. No exercise.            Family History:  The patient's family history includes Stroke in her paternal  grandmother. There is no history of Heart attack.   ROS:   Please see the history of present illness.  All other systems are reviewed and otherwise negative.    PHYSICAL EXAM:   VS:  BP 122/76 mmHg  Pulse 67  Ht 5\' 3"  (1.6 m)  Wt 173 lb 6.4 oz (78.654 kg)  BMI 30.72 kg/m2  SpO2 98%  BMI: Body mass index is 30.72 kg/(m^2). GEN: Well nourished, well developed F, in no acute distress HEENT:  normocephalic, atraumatic Neck: no JVD, carotid bruits, or masses Cardiac: RRR; no murmurs, rubs, or gallops, no edema  Respiratory:  clear to auscultation bilaterally, normal work of breathing GI: soft, nontender, nondistended, + BS MS: no deformity or atrophy Skin: warm and dry, no rash. Right radial cath site without hematoma or ecchymosis; good pulse - mild redness right at cath site but appears granulomatous tissue of healing rather than any hematoma Neuro:  Alert and Oriented x 3, Strength and sensation are intact, follows commands Psych: euthymic mood, full affect  Wt Readings from Last 3 Encounters:  05/30/16 173 lb 6.4 oz (78.654 kg)  05/08/16 169 lb 8 oz (76.885 kg)  02/27/16 169 lb (76.658 kg)      Studies/Labs Reviewed:   EKG:  EKG was not ordered today.  Recent Labs: 05/08/2016: B Natriuretic Peptide 7.4; TSH 3.666 05/09/2016: BUN 13; Creatinine, Ser 0.67; Hemoglobin 12.5; Platelets 192; Potassium 4.2; Sodium 139   Lipid Panel    Component Value Date/Time   CHOL 151 05/08/2016 0412   TRIG 101 05/08/2016 0412   HDL 69 05/08/2016 0412   CHOLHDL 2.2 05/08/2016 0412   VLDL 20 05/08/2016 0412   LDLCALC 62 05/08/2016 0412    Additional studies/ records that were reviewed today include: Summarized above.    ASSESSMENT & PLAN:   1. Chest pain - with cath as above. Feeling much better - continue Imdur given significant improvement. 2. CAD as above - continue ASA, BB, statin, Plavix. 3. Hyperlipidemia - continue statin. 4. Possible post-cath skin infection - improving per  patient report. She has finished Keflex. This does not look acutely infected today. She will monitor for continued improvement.  Disposition: F/u with Dr. Burt Knack in 4 months.   Medication Adjustments/Labs and Tests Ordered: Current medicines are reviewed at length with the patient today.  Concerns regarding medicines are outlined above. Medication changes, Labs and Tests ordered today are listed below.  Raechel Ache PA-C  05/30/2016 2:25 PM    [facility] [place], [place], [place]  [address] Phone: [phone]; Fax: [phone]

## 2016-05-30 ENCOUNTER — Encounter: Payer: Self-pay | Admitting: Physician Assistant

## 2016-05-30 ENCOUNTER — Ambulatory Visit (INDEPENDENT_AMBULATORY_CARE_PROVIDER_SITE_OTHER): Payer: 59 | Admitting: Physician Assistant

## 2016-05-30 VITALS — BP 122/76 | HR 67 | Ht 63.0 in | Wt 173.4 lb

## 2016-05-30 DIAGNOSIS — L089 Local infection of the skin and subcutaneous tissue, unspecified: Secondary | ICD-10-CM

## 2016-05-30 DIAGNOSIS — I25119 Atherosclerotic heart disease of native coronary artery with unspecified angina pectoris: Secondary | ICD-10-CM

## 2016-05-30 DIAGNOSIS — E785 Hyperlipidemia, unspecified: Secondary | ICD-10-CM

## 2016-05-30 DIAGNOSIS — R079 Chest pain, unspecified: Secondary | ICD-10-CM | POA: Diagnosis not present

## 2016-05-30 NOTE — Patient Instructions (Addendum)
Medication Instructions:  Your physician recommends that you continue on your current medications as directed. Please refer to the Current Medication list given to you today.   Labwork: None ordered  Testing/Procedures: None ordered  Follow-Up: Your physician recommends that you schedule a follow-up appointment in: 4 MONTHS WITH DR. COOPER   Any Other Special Instructions Will Be Listed Below (If Applicable).     If you need a refill on your cardiac medications before your next appointment, please call your pharmacy.   

## 2016-06-07 MED FILL — predniSONE 5 MG TABS: 5 | 9 days supply | Qty: 20 | Fill #0

## 2016-06-08 MED FILL — SIMPONI 100 MG/ML SOAJ: 100 | 30 days supply | Qty: 1 | Fill #3

## 2016-06-12 ENCOUNTER — Encounter: Payer: Self-pay | Admitting: *Deleted

## 2016-06-12 ENCOUNTER — Emergency Department
Admission: EM | Admit: 2016-06-12 | Discharge: 2016-06-12 | Disposition: A | Discharge status: Home / Self Care | Payer: 59 | Source: Home / Self Care | Attending: Family Medicine | Admitting: Family Medicine

## 2016-06-12 DIAGNOSIS — Z20818 Contact with and (suspected) exposure to other bacterial communicable diseases: Secondary | ICD-10-CM

## 2016-06-12 DIAGNOSIS — J029 Acute pharyngitis, unspecified: Secondary | ICD-10-CM

## 2016-06-12 DIAGNOSIS — Z2089 Contact with and (suspected) exposure to other communicable diseases: Secondary | ICD-10-CM | POA: Diagnosis not present

## 2016-06-12 LAB — POCT RAPID STREP A (OFFICE): Rapid Strep A Screen: NEGATIVE

## 2016-06-12 NOTE — Discharge Instructions (Signed)
You may take 400-600mg Ibuprofen (Motrin) every 6-8 hours for fever and pain  °Alternate with Tylenol  °You may take 500mg Tylenol every 4-6 hours as needed for fever and pain  °Follow-up with your primary care provider next week for recheck of symptoms if not improving.  °Be sure to drink plenty of fluids and rest, at least 8hrs of sleep a night, preferably more while you are sick. °Return urgent care or go to closest ER if you cannot keep down fluids/signs of dehydration, fever not reducing with Tylenol, difficulty breathing/wheezing, stiff neck, worsening condition, or other concerns (see below)  ° ° ° °

## 2016-06-12 NOTE — ED Notes (Signed)
Pt c/o sore throat and aches x 1 week. Afebrile. Daughter + for strep

## 2016-06-12 NOTE — ED Provider Notes (Signed)
CSN: [id number]     Arrival date & time 06/12/16  1733 History   First MD Initiated Contact with Patient 06/12/16 1803     Chief Complaint  Patient presents with  . Sore Throat   (Consider location/radiation/quality/duration/timing/severity/associated sxs/prior Treatment) HPI [name] is a 50 y.o. female presenting to UC with c/o 1 week of persistent sore throat that is moderate in severity, worse with swallowing. Daughter was dx with strep throat yesterday.  Daughter has been sick for longer than pt.  She has taken acetaminophen and ibuprofen with moderate relief. Mild cough and congestion. Denies fever, chills, n/v/d.    Past Medical History  Diagnosis Date  . SINUS TACHYCARDIA 11/08/2010  . MIGRAINE W/O AURA W/INTRACT W/STATUS MIGRAINOSUS 02/19/2008  . HYPERLIPIDEMIA-MIXED   . CAD, ARTERY BYPASS GRAFT     a. DES to RCA in 2010 then LAD occlusion s/p CABG 3 06/07/2009 with LIMA to LAD, reverse SVG to D1, reverse SVG to distal RCA. b. Cath 05/08/2016 slightly hypodense region in the intermediate branch, however she had excellent flow, FFR was normal. Vein graft to PDA and the posterolateral branch is patent, patent LIMA to LAD, occluded SVG to diagonal.  . Atrial fibrillation ([facility])     a. after CABG.  Marland Kitchen ACUT MI ANTEROLAT WALL SUBSQT EPIS CARE   . URI   . TRIGGER FINGER   . HYPERTHYROIDISM   . DIABETIC  RETINOPATHY   . DIABETES MELLITUS, TYPE I     on insulin pump  . DERMATITIS, ALLERGIC   . CHOLELITHIASIS   . CARPAL TUNNEL SYNDROME, BILATERAL   . ARTHRITIS, RHEUMATOID   . ALLERGIC RHINITIS, SEASONAL   . Acute maxillary sinusitis   . Insulin pump in place   . Contrast media allergy    Past Surgical History  Procedure Laterality Date  . Coronary artery bypass graft    . Vitrectomy    . Caesarean section    . Carpal tunnel release    . Cholecystectomy    . Cardiac catheterization N/A 05/08/2016    Procedure: Left Heart Cath and Cors/Grafts Angiography;  Surgeon: Burnell Blanks, MD;  Location: [facility];  Service: Cardiovascular;  Laterality: N/A;   Family History  Problem Relation Age of Onset  . Diabetes    . Heart disease    . Hypertension    . Hyperlipidemia    . Depression    . Migraines    . Heart attack Neg Hx   . Stroke Paternal Grandmother    Social History  Substance Use Topics  . Smoking status: Never Smoker   . Smokeless tobacco: Never Used  . Alcohol Use: No   OB History    No data available     Review of Systems  Constitutional: Negative for fever and chills.  HENT: Positive for congestion and sore throat. Negative for ear pain, trouble swallowing and voice change.   Respiratory: Positive for cough. Negative for shortness of breath.   Cardiovascular: Negative for chest pain and palpitations.  Gastrointestinal: Negative for nausea, vomiting, abdominal pain and diarrhea.  Musculoskeletal: Negative for myalgias, back pain and arthralgias.  Skin: Negative for rash.  Neurological: Negative for dizziness, light-headedness and headaches.    Allergies  Prochlorperazine; Ramipril; Shellfish-derived products; Atorvastatin; Etanercept; Infliximab; Orencia; Shellfish allergy; Tofacitinib; Prochlorperazine edisylate; Amiodarone; and Rituximab  Home Medications   Prior to Admission medications   Medication Sig Start Date End Date Taking? Authorizing Provider  aspirin 81 MG tablet Take  81 mg by mouth daily.     Historical Provider, MD  clopidogrel (PLAVIX) 75 MG tablet Take 1 tablet (75 mg total) by mouth daily. 05/09/16   Almyra Deforest, PA  cyclobenzaprine (FLEXERIL) 10 MG tablet Take 10 mg by mouth at bedtime.  10/04/14   Historical Provider, MD  EPINEPHrine 0.15 MG/0.15ML SOAJ Inject 0.15 mg into the skin as needed. Inject as directed.    Historical Provider, MD  furosemide (LASIX) 40 MG tablet Take 40 mg by mouth daily as needed for edema (When she feels like she is swelling).    Historical Provider, MD  Golimumab (SIMPONI) 100  MG/ML SOAJ Inject 100 mg into the skin every 30 (thirty) days.    Historical Provider, MD  insulin lispro (HUMALOG) 100 UNIT/ML injection FOR USE IN INSULIN PUMP. TOTAL DAILY INSULIN DOSE = UP TO 90 UNITS. 03/24/15   Historical Provider, MD  isosorbide mononitrate (IMDUR) 30 MG 24 hr tablet Take 0.5 tablets (15 mg total) by mouth daily. 05/09/16   Almyra Deforest, PA  leflunomide (ARAVA) 20 MG tablet Take 20 mg by mouth daily.    Historical Provider, MD  losartan (COZAAR) 50 MG tablet Take 1 tablet (50 mg total) by mouth daily. 03/30/15   Sherren Mocha, MD  metoprolol tartrate (LOPRESSOR) 25 MG tablet Take 1 tablet (25 mg total) by mouth 2 (two) times daily. 03/30/15   Sherren Mocha, MD  nabumetone (RELAFEN) 500 MG tablet Take 500 mg by mouth 2 (two) times daily.     Historical Provider, MD  Omega-3 Fatty Acids (FISH OIL) 1200 MG CAPS Take 1,200 mg by mouth 2 (two) times daily.    Historical Provider, MD  potassium chloride (K-DUR) 10 MEQ tablet Take 1 tablet (10 mEq total) by mouth daily. 03/30/15   Sherren Mocha, MD  rosuvastatin (CRESTOR) 10 MG tablet Take 1 tablet (10 mg total) by mouth daily. 05/11/16   Sherren Mocha, MD  topiramate (TOPAMAX) 50 MG tablet Take 50 mg by mouth 2 (two) times daily.    Historical Provider, MD   Meds Ordered and Administered this Visit  Medications - No data to display  BP 117/77 mmHg  Pulse 70  Temp(Src) 97.8 F (36.6 C) (Oral)  Ht 5\' 3"  (1.6 m)  Wt 169 lb (76.658 kg)  BMI 29.94 kg/m2  SpO2 99% No data found.   Physical Exam  Constitutional: She appears well-developed and well-nourished. No distress.  HENT:  Head: Normocephalic and atraumatic.  Right Ear: Tympanic membrane normal.  Left Ear: Tympanic membrane normal.  Nose: Nose normal.  Mouth/Throat: Uvula is midline and mucous membranes are normal. Posterior oropharyngeal erythema present. No oropharyngeal exudate, posterior oropharyngeal edema or tonsillar abscesses.  Eyes: Conjunctivae are normal. No  scleral icterus.  Neck: Normal range of motion. Neck supple.  Cardiovascular: Normal rate, regular rhythm and normal heart sounds.   Pulmonary/Chest: Effort normal and breath sounds normal. No stridor. No respiratory distress. She has no wheezes. She has no rales.  Musculoskeletal: Normal range of motion.  Lymphadenopathy:    She has no cervical adenopathy.  Neurological: She is alert.  Skin: Skin is warm and dry. She is not diaphoretic.  Nursing note and vitals reviewed.   ED Course  Procedures (including critical care time)  Labs Review Labs Reviewed  STREP A DNA PROBE  POCT RAPID STREP A (OFFICE)    Imaging Review No results found.    MDM   1. Acute pharyngitis, unspecified etiology   2. Exposure to strep  throat    Pt c/o sore throat. No evidence of abscess. Rapid strep: negative Will send culture due to recent exposure.  Treat symptomatically until culture results. Advised pt to use acetaminophen and ibuprofen as needed for fever and pain. Encouraged rest and fluids. F/u with PCP in 7-10 days if not improving, sooner if worsening. Pt verbalized understanding and agreement with tx plan.     Noland Fordyce, PA-C 06/12/16 1923

## 2016-06-13 ENCOUNTER — Telehealth: Payer: Self-pay | Admitting: Emergency Medicine

## 2016-06-13 LAB — STREP A DNA PROBE: GASP: NOT DETECTED

## 2016-06-15 MED FILL — HumaLOG 100 UNIT/ML SOLN: 100 | 89 days supply | Qty: 80 | Fill #0

## 2016-06-15 MED FILL — ONE TOUCH ULTRA TEST STRIPS: 90 days supply | Qty: 400 | Fill #0

## 2016-07-04 ENCOUNTER — Other Ambulatory Visit: Payer: Self-pay | Admitting: Cardiovascular Disease

## 2016-07-04 MED FILL — ISOSORBIDE MN ER 30 MG TAB: 30 | 60 days supply | Qty: 30 | Fill #1

## 2016-07-04 MED FILL — METOPROLOL TARTRATE 25 MG T: 25 | 90 days supply | Qty: 180 | Fill #0

## 2016-07-05 MED FILL — LEFLUNOMIDE 20 MG TABLET: 20 | 90 days supply | Qty: 90 | Fill #0

## 2016-07-09 DIAGNOSIS — M7071 Other bursitis of hip, right hip: Secondary | ICD-10-CM | POA: Diagnosis not present

## 2016-07-09 DIAGNOSIS — L408 Other psoriasis: Secondary | ICD-10-CM | POA: Diagnosis not present

## 2016-07-09 DIAGNOSIS — M0579 Rheumatoid arthritis with rheumatoid factor of multiple sites without organ or systems involvement: Secondary | ICD-10-CM | POA: Diagnosis not present

## 2016-07-09 DIAGNOSIS — M79641 Pain in right hand: Secondary | ICD-10-CM | POA: Diagnosis not present

## 2016-07-09 MED FILL — HYDROXYCHLOROQUINE 200 MG T: 200 | 90 days supply | Qty: 135 | Fill #0

## 2016-07-09 MED FILL — NABUMETONE 750 MG TABLET: 750 | 30 days supply | Qty: 60 | Fill #0

## 2016-07-11 ENCOUNTER — Ambulatory Visit (HOSPITAL_BASED_OUTPATIENT_CLINIC_OR_DEPARTMENT_OTHER): Payer: 59 | Admitting: Pharmacist

## 2016-07-11 DIAGNOSIS — M069 Rheumatoid arthritis, unspecified: Secondary | ICD-10-CM

## 2016-07-11 MED ORDER — GOLIMUMAB 100 MG/ML ~~LOC~~ SOAJ: 100.0000 mg | SUBCUTANEOUS | Status: DC

## 2016-07-11 MED FILL — SIMPONI 100 MG/ML SOAJ: 100 | 30 days supply | Qty: 1 | Fill #0

## 2016-07-11 NOTE — Progress Notes (Signed)
   S: Patient presents today to the [facility].  Patient is currently taking Simponi for rheumatoid arthritis. Patient is managed by Dr. Estanislado Pandy for this.   Adherence: denies any missed doses  Dosing:  Rheumatoid arthritis: SubQ: 50 mg once a month (in combination with methotrexate)  Drug-drug interactions: Arava (leflunomide) and Humira: Immunosuppressants may enhance the adverse/toxic effect of Leflunomide. Specifically, the risk for hematologic toxicity such as pancytopenia, agranulocytosis, and/or thrombocytopenia may be increased. Last CBC good and is closely monitored.   Screening: TB test: completed per patient Hepatitis: completed per patient  Monitoring: S/sx of infection: denies CBC WNL 04/2016 S/sx of hypersensitivity: denies S/sx of malignancy: denies S/sx of heart failure: denies S/sx of autoimmune disorder: denies    O:     Lab Results  Component Value Date   WBC 7.0 05/09/2016   HGB 12.5 05/09/2016   HCT 36.7 05/09/2016   MCV 85.9 05/09/2016   PLT 192 05/09/2016      Chemistry      Component Value Date/Time   NA 139 05/09/2016 0401   K 4.2 05/09/2016 0401   CL 108 05/09/2016 0401   CO2 21* 05/09/2016 0401   BUN 13 05/09/2016 0401   CREATININE 0.67 05/09/2016 0401      Component Value Date/Time   CALCIUM 8.6* 05/09/2016 0401   ALKPHOS 70 11/08/2010 1257   AST 22 11/08/2010 1257   ALT 22 11/08/2010 1257   BILITOT 0.6 11/08/2010 1257       A/P: 1. Medication review: Patient currently on Simponi for RA and is tolerating it well with no adverse effects. Reviewed the medication with her, including the following: Simponi, golimumab, is a TNF blocker.  There is an increased risk of infection and malignancy with this medication. Do not give patients live vaccinations while they are on this medication. No recommendations for changes.    Nicoletta Ba, PharmD, BCPS, [facility] [phone]

## 2016-07-16 ENCOUNTER — Other Ambulatory Visit: Payer: Self-pay | Admitting: *Deleted

## 2016-07-16 DIAGNOSIS — M069 Rheumatoid arthritis, unspecified: Secondary | ICD-10-CM | POA: Diagnosis not present

## 2016-07-16 DIAGNOSIS — M65332 Trigger finger, left middle finger: Secondary | ICD-10-CM | POA: Diagnosis not present

## 2016-07-16 DIAGNOSIS — Z0000 Encounter for general adult medical examination without abnormal findings: Secondary | ICD-10-CM | POA: Diagnosis not present

## 2016-07-16 DIAGNOSIS — M79642 Pain in left hand: Secondary | ICD-10-CM | POA: Diagnosis not present

## 2016-07-16 DIAGNOSIS — M545 Low back pain: Secondary | ICD-10-CM | POA: Diagnosis not present

## 2016-07-16 DIAGNOSIS — M79641 Pain in right hand: Secondary | ICD-10-CM | POA: Diagnosis not present

## 2016-07-16 DIAGNOSIS — I2510 Atherosclerotic heart disease of native coronary artery without angina pectoris: Secondary | ICD-10-CM | POA: Diagnosis not present

## 2016-07-16 DIAGNOSIS — I10 Essential (primary) hypertension: Secondary | ICD-10-CM | POA: Diagnosis not present

## 2016-07-16 DIAGNOSIS — E0590 Thyrotoxicosis, unspecified without thyrotoxic crisis or storm: Secondary | ICD-10-CM | POA: Diagnosis not present

## 2016-07-16 DIAGNOSIS — Z0001 Encounter for general adult medical examination with abnormal findings: Secondary | ICD-10-CM | POA: Diagnosis not present

## 2016-07-16 DIAGNOSIS — M6530 Trigger finger, unspecified finger: Secondary | ICD-10-CM | POA: Insufficient documentation

## 2016-07-16 DIAGNOSIS — G43909 Migraine, unspecified, not intractable, without status migrainosus: Secondary | ICD-10-CM | POA: Diagnosis not present

## 2016-07-16 DIAGNOSIS — J358 Other chronic diseases of tonsils and adenoids: Secondary | ICD-10-CM | POA: Diagnosis not present

## 2016-07-16 DIAGNOSIS — E039 Hypothyroidism, unspecified: Secondary | ICD-10-CM | POA: Diagnosis not present

## 2016-07-16 DIAGNOSIS — E119 Type 2 diabetes mellitus without complications: Secondary | ICD-10-CM | POA: Diagnosis not present

## 2016-07-16 DIAGNOSIS — R5383 Other fatigue: Secondary | ICD-10-CM | POA: Diagnosis not present

## 2016-07-16 LAB — HEPATIC FUNCTION PANEL
ALT: 15 U/L (ref 7–35)
AST: 18 U/L (ref 13–35)
Alkaline Phosphatase: 72 U/L (ref 25–125)
Bilirubin, Total: 0.5 mg/dL

## 2016-07-16 LAB — VITAMIN D 25 HYDROXY (VIT D DEFICIENCY, FRACTURES): VIT D 25 HYDROXY: 38

## 2016-07-16 LAB — BASIC METABOLIC PANEL
BUN: 7 mg/dL (ref 4–21)
Creatinine: 0.7 mg/dL (ref 0.5–1.1)
Glucose: 119 mg/dL
Potassium: 4 mmol/L (ref 3.4–5.3)
Sodium: 142 mmol/L (ref 137–147)

## 2016-07-16 LAB — CBC AND DIFFERENTIAL
HEMATOCRIT: 42 % (ref 36–46)
Hemoglobin: 14.5 g/dL (ref 12.0–16.0)
NEUTROS ABS: 4 /uL
Platelets: 207 10*3/uL (ref 150–399)
WBC: 4.8 10*3/mL

## 2016-07-16 MED FILL — TOPIRAMATE 50 MG TABLET: 50 | 90 days supply | Qty: 180 | Fill #0

## 2016-07-16 NOTE — Patient Outreach (Signed)
Left message on mobile number, identified as Leeanne Whelan' number, advising her to call and reschedule her initial Link To Wellness appointment for 7/20 at 3:00 pm to another time due to this RNCM's schedule. Barrington Ellison RN,CCM,CDE [place] Management Coordinator Link To Wellness Office Phone [phone] Office Fax [phone]

## 2016-07-19 ENCOUNTER — Ambulatory Visit: Payer: Self-pay | Admitting: *Deleted

## 2016-07-31 ENCOUNTER — Other Ambulatory Visit: Payer: Self-pay | Admitting: *Deleted

## 2016-07-31 ENCOUNTER — Encounter: Payer: Self-pay | Admitting: *Deleted

## 2016-07-31 VITALS — BP 110/68 | Ht 63.0 in | Wt 173.0 lb

## 2016-07-31 DIAGNOSIS — E1059 Type 1 diabetes mellitus with other circulatory complications: Secondary | ICD-10-CM

## 2016-07-31 NOTE — Patient Outreach (Addendum)
[facility]) Care Management   07/31/2016  [name] [birth year] [id number]  [name] is an 50 y.o. female who presents to the [place] office with her 41 year old daughter to enroll in the Link To Wellness program for self management assistance with Type I DM.  Subjective: [name] says she was referred to the [facility] To Wellness program at new employee orientation. 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 She was previously in the program in 2011 but left the [employer] before she returned in January of this year. She says she stays in good control as evidenced by her Hgb A1C meeting target consistently. Her endocrinologist wants her to try the Medtronic 670 G hybrid loop system and this system is covered at 100% for Link To Wellness members.  Objective:   Review of Systems  Constitutional: Negative.     Physical Exam  Constitutional: She is oriented to person, place, and time. She appears well-developed and well-nourished.  Respiratory: Effort normal.  Neurological: She is alert and oriented to person, place, and time.  Skin: Skin is warm and dry.  Psychiatric: She has a normal mood and affect. Her behavior is normal. Judgment and thought content normal.    Encounter Medications:   Outpatient Encounter Prescriptions as of 07/31/2016  Medication Sig Note  . aspirin 81 MG tablet Take 81 mg by mouth daily.    . clopidogrel (PLAVIX) 75 MG tablet Take 1 tablet (75 mg total) by mouth daily.   . cyclobenzaprine (FLEXERIL) 10 MG tablet Take 10 mg by mouth at bedtime.  07/31/2016: Uses for sleep  . EPINEPHrine 0.15 MG/0.15ML SOAJ Inject 0.15 mg into the skin as needed. Inject as directed. 07/31/2016: For food allergies  . furosemide (LASIX) 40 MG tablet Take 40 mg by mouth daily as needed for edema (When she feels like she is swelling).   . Golimumab (SIMPONI) 100 MG/ML SOAJ Inject 100 mg into the skin every 30 (thirty) days.   . insulin  lispro (HUMALOG) 100 UNIT/ML injection FOR USE IN INSULIN PUMP. TOTAL DAILY INSULIN DOSE = UP TO 90 UNITS. Pump settings: Basal rates- 12 am 1.8 units/hr,  3 am 1.8 units/hr, 6 am 1.8 units/hr, 10 pm- 1.9 units/hr Insulin/CHO ratio 1:9 Target Blood glucose= 110   . isosorbide mononitrate (IMDUR) 30 MG 24 hr tablet Take 0.5 tablets (15 mg total) by mouth daily.   Marland Kitchen leflunomide (ARAVA) 20 MG tablet Take 20 mg by mouth daily. 07/31/2016: For RA  . losartan (COZAAR) 50 MG tablet Take 1 tablet (50 mg total) by mouth daily.   . metoprolol tartrate (LOPRESSOR) 25 MG tablet TAKE 1 TABLET BY MOUTH TWICE DAILY   . nabumetone (RELAFEN) 500 MG tablet Take 500 mg by mouth 2 (two) times daily.    . Omega-3 Fatty Acids (FISH OIL) 1200 MG CAPS Take 1,200 mg by mouth 2 (two) times daily.   . potassium chloride (K-DUR) 10 MEQ tablet Take 1 tablet (10 mEq total) by mouth daily. 07/31/2016: Take when she uses Lasix  . rosuvastatin (CRESTOR) 10 MG tablet Take 1 tablet (10 mg total) by mouth daily.   Marland Kitchen topiramate (TOPAMAX) 50 MG tablet Take 50 mg by mouth 2 (two) times daily.    No facility-administered encounter medications on file as of 07/31/2016.     Functional Status:   In your present state of health, do you have any difficulty performing the following activities: 07/31/2016  05/08/2016  Hearing? N -  Vision? N -  Difficulty concentrating or making decisions? N -  Walking or climbing stairs? N -  Dressing or bathing? N -  Doing errands, shopping? N N  Preparing Food and eating ? N -  Using the Toilet? N -  In the past six months, have you accidently leaked urine? N -  Do you have problems with loss of bowel control? N -  Managing your Medications? N -  Managing your Finances? N -  Some recent data might be hidden    Fall/Depression Screening:    PHQ 2/9 Scores 07/31/2016  PHQ - 2 Score 0    Assessment:   [employer] employee since January 2017 with good control of Type I DM as evidenced by Hgb A1C=  6.4% on 03/26/16 and history of good control with Hgb A1C range 6.4%- 7.0% for many years  Plan:   [facility] CM Care Plan Problem One   Flowsheet Row Most Recent Value  Care Plan Problem One  Type I DM  with good control as evidenced by blood sugars and Hgb A1C= 6.4 % on 03/26/16, overweight   Role Documenting the Problem One  Care Management Coordinator  Care Plan for Problem One  Active  THN Long Term Goal (31-90 days) Ongoing good control of Type I DM as evidenced by 75% of CBGs meeting target without increased frequency of hypoglycemia and  consistently meeting Hgb A1C target of <7.0%  and evidence of weight loss or no weight gain at each assessment  THN Long Term Goal Start Date  07/31/16  Interventions for Problem One Long Term Goal Discussed Link to Wellness program goals, requirements and benefits, reviewed member's rights and responsibilities ,provided diabetes information packet with explanation of contents, ensured member agreed and signed consent to participate and authorization to release and receive health information, consent, participation agreement and consent to enroll in program, assessed member's current knowledge of diabetes, educated [name] on the [facility] CM website to determine if her current doctors are in the [facility] network to receive the associated primary care no co pay benefit, , faxed referral to the [facility] Education [facility] to meet with RD/ pump trainer to meet program education requirements for Type I DM members, explainDed to member how to obtain a glucometer and testing supplies and insulin and pump supplies at no cost once initial required program education is complete, assessed insulin pump settings, reviewed patient's CBG log, reviewed blood glucose monitoring and interpretation, discussed recommended blood glucose target, insulin to CHO ratio ,target ranges for pre-meal and post-meal, provided blood sugar log sheets with targets for pre and post meal, discussed role  of continuous glucose monitor and insulin pump and supplies and  Link To Wellness/[place] plan benefit coverage, discussed the Medtronic 670 G closed loop system and [place] benefits that cover the system at 100%,  discussed exercise opportunities offered for free by [facility] , will arrange for Link To Wellness follow up after Almyra Free meets with Bev  THN CM Short Term Goal #1 (0-30 days)  Patient will keep appointment with RD, CDE, pump trainer Bev Paddock  Interventions for Short Term Goal #1  Education provided related to nutritional counseling benefit, referral faxed to the Nutrition and Diabetes Education Services    RNCM to fax today's office visit note to Dr. Jannifer Franklin and Dr. Steffanie Dunn.  RNCM will meet quarterly to assist with Type I DM  and weight management and assess patient's progress toward  mutually set goals. Barrington Ellison RN,CCM,CDE [place] Management Coordinator Link To Wellness Office Phone [phone] Office Fax [phone]

## 2016-08-01 ENCOUNTER — Encounter: Payer: Self-pay | Admitting: Physician Assistant

## 2016-08-08 ENCOUNTER — Encounter: Payer: 59 | Attending: Internal Medicine | Admitting: *Deleted

## 2016-08-08 DIAGNOSIS — E119 Type 2 diabetes mellitus without complications: Secondary | ICD-10-CM

## 2016-08-08 DIAGNOSIS — Z713 Dietary counseling and surveillance: Secondary | ICD-10-CM | POA: Insufficient documentation

## 2016-08-08 NOTE — Patient Instructions (Signed)
We have discussed the Medtronic 670G and have contacted the company to see what your warranty date is. When your pump does ship, call this office to make an appointment for training We also discussed types of infusion sets and you may want to try the Quick-set 9 mm  I gave you sample flyer for [employer] tape which you can order on-line @ [email or web address] Feel free to join our Type 1 Support Group anytime.

## 2016-08-09 MED FILL — CLOPIDOGREL 75 MG TABLET: 75 | 90 days supply | Qty: 90 | Fill #1

## 2016-08-09 MED FILL — NABUMETONE 750 MG TABLET: 750 | 30 days supply | Qty: 60 | Fill #1

## 2016-08-09 MED FILL — ISOSORBIDE MN ER 30 MG TAB: 30 | 30 days supply | Qty: 30 | Fill #0

## 2016-08-09 MED FILL — PANTOPRAZOLE SOD DR 40 MG T: 40 | 90 days supply | Qty: 90 | Fill #1

## 2016-08-10 MED FILL — SIMPONI 100 MG/ML SOAJ: 100 | 30 days supply | Qty: 1 | Fill #1

## 2016-08-13 ENCOUNTER — Emergency Department (INDEPENDENT_AMBULATORY_CARE_PROVIDER_SITE_OTHER): Payer: 59

## 2016-08-13 ENCOUNTER — Emergency Department
Admission: EM | Admit: 2016-08-13 | Discharge: 2016-08-13 | Disposition: A | Discharge status: Home / Self Care | Payer: 59 | Source: Home / Self Care | Attending: Family Medicine | Admitting: Family Medicine

## 2016-08-13 ENCOUNTER — Encounter: Payer: Self-pay | Admitting: *Deleted

## 2016-08-13 DIAGNOSIS — M25531 Pain in right wrist: Secondary | ICD-10-CM | POA: Diagnosis not present

## 2016-08-13 DIAGNOSIS — J029 Acute pharyngitis, unspecified: Secondary | ICD-10-CM | POA: Diagnosis not present

## 2016-08-13 DIAGNOSIS — H65191 Other acute nonsuppurative otitis media, right ear: Secondary | ICD-10-CM | POA: Diagnosis not present

## 2016-08-13 MED ORDER — AMOXICILLIN-POT CLAVULANATE 875-125 MG PO TABS: 1.0000 | ORAL_TABLET | Freq: Two times a day (BID) | ORAL | 0 refills | Status: DC

## 2016-08-13 MED ORDER — PREDNISONE 20 MG PO TABS: ORAL_TABLET | ORAL | 0 refills | Status: DC

## 2016-08-13 NOTE — ED Triage Notes (Signed)
Pt c/o right sided throat pain x 1 month with no other associated symptoms, right ear pain developed 2 days ago. She has taken mucinex sinus and tylenol cold.  C/o right wrist pain without injury x 1 week. After pain started, fell and made wrist pain worse. H/o RA.

## 2016-08-13 NOTE — Discharge Instructions (Signed)

## 2016-08-13 NOTE — ED Provider Notes (Signed)
CSN: [id number]     Arrival date & time 08/13/16  [id number] History   First MD Initiated Contact with Patient 08/13/16 [phone]     Chief Complaint  Patient presents with  . Otalgia  . Sore Throat  . Wrist Pain   (Consider location/radiation/quality/duration/timing/severity/associated sxs/prior Treatment) HPI [name] is a 50 y.o. female presenting to UC with c/o Right ear pain with associated Right sided throat pain she has had for at least 1 month.  Ear pain started about 3-4 days ago.  Ear and throat pain is aching and throbbing, worse with swallowing, moderate in severity. She has been taking Mucinex and Tylenol sinus with minimal relief.  Pt was seen in June of this year at [facility] for a sore throat, her daughter was dx with strep at the time but pt's rapid strep and strep culture came back negative. Pt states her throat pain never completely resolved.  She reports chills but unknown fever. Denies n/v/d. She is also c/o Right wrist pain that started about 2-3 days ago, she believes due to her RA, however, she then tripped and fell yesterday worsening the pain. Pain is aching and sore, 7/10.  She is Right hand dominant.   Past Medical History:  Diagnosis Date  . ACUT MI ANTEROLAT WALL SUBSQT EPIS CARE   . Acute maxillary sinusitis   . ALLERGIC RHINITIS, SEASONAL   . ARTHRITIS, RHEUMATOID   . Atrial fibrillation ([place])    a. after CABG.  . CAD, ARTERY BYPASS GRAFT    a. DES to RCA in 2010 then LAD occlusion s/p CABG 3 06/07/2009 with LIMA to LAD, reverse SVG to D1, reverse SVG to distal RCA. b. Cath 05/08/2016 slightly hypodense region in the intermediate branch, however she had excellent flow, FFR was normal. Vein graft to PDA and the posterolateral branch is patent, patent LIMA to LAD, occluded SVG to diagonal.  . CARPAL TUNNEL SYNDROME, BILATERAL   . CHOLELITHIASIS   . Contrast media allergy   . DERMATITIS, ALLERGIC   . DIABETES MELLITUS, TYPE I    on insulin pump  . DIABETIC  RETINOPATHY    . HYPERLIPIDEMIA-MIXED   . HYPERTHYROIDISM   . Insulin pump in place   . MIGRAINE W/O AURA W/INTRACT W/STATUS MIGRAINOSUS 02/19/2008  . SINUS TACHYCARDIA 11/08/2010  . TRIGGER FINGER   . URI    Past Surgical History:  Procedure Laterality Date  . Caesarean section    . CARDIAC CATHETERIZATION N/A 05/08/2016   Procedure: Left Heart Cath and Cors/Grafts Angiography;  Surgeon: Burnell Blanks, MD;  Location: [facility];  Service: Cardiovascular;  Laterality: N/A;  . CARPAL TUNNEL RELEASE    . CHOLECYSTECTOMY    . CORONARY ARTERY BYPASS GRAFT    . VITRECTOMY     Family History  Problem Relation Age of Onset  . Diabetes    . Heart disease    . Hypertension    . Hyperlipidemia    . Depression    . Migraines    . Stroke Paternal Grandmother   . Heart attack Neg Hx    Social History  Substance Use Topics  . Smoking status: Never Smoker  . Smokeless tobacco: Never Used  . Alcohol use No   OB History    No data available     Review of Systems  Constitutional: Positive for chills. Negative for appetite change, fatigue and fever.  HENT: Positive for congestion, ear pain (Right) and sore throat. Negative for rhinorrhea, sinus  pressure, trouble swallowing and voice change.   Respiratory: Negative for cough and shortness of breath.   Gastrointestinal: Negative for abdominal pain, diarrhea, nausea and vomiting.  Musculoskeletal: Positive for arthralgias (Right wrist). Negative for joint swelling and myalgias.  Skin: Negative for color change and rash.    Allergies  Prochlorperazine; Ramipril; Shellfish-derived products; Atorvastatin; Etanercept; Infliximab; Orencia [abatacept]; Shellfish allergy; Tofacitinib; Prochlorperazine edisylate; Amiodarone; and Rituximab  Home Medications   Prior to Admission medications   Medication Sig Start Date End Date Taking? Authorizing Provider  amoxicillin-clavulanate (AUGMENTIN) 875-125 MG tablet Take 1 tablet by mouth 2 (two)  times daily. One po bid x 7 days 08/13/16   Noland Fordyce, PA-C  aspirin 81 MG tablet Take 81 mg by mouth daily.     Historical Provider, MD  clopidogrel (PLAVIX) 75 MG tablet Take 1 tablet (75 mg total) by mouth daily. 05/09/16   Almyra Deforest, PA  cyclobenzaprine (FLEXERIL) 10 MG tablet Take 10 mg by mouth at bedtime.  10/04/14   Historical Provider, MD  EPINEPHrine 0.15 MG/0.15ML SOAJ Inject 0.15 mg into the skin as needed. Inject as directed.    Historical Provider, MD  furosemide (LASIX) 40 MG tablet Take 40 mg by mouth daily as needed for edema (When she feels like she is swelling).    Historical Provider, MD  Golimumab (SIMPONI) 100 MG/ML SOAJ Inject 100 mg into the skin every 30 (thirty) days. 07/11/16   Olugbemiga Essie Christine, MD  insulin lispro (HUMALOG) 100 UNIT/ML injection FOR USE IN INSULIN PUMP. TOTAL DAILY INSULIN DOSE = UP TO 90 UNITS. 03/24/15   Historical Provider, MD  isosorbide mononitrate (IMDUR) 30 MG 24 hr tablet Take 0.5 tablets (15 mg total) by mouth daily. 05/09/16   Almyra Deforest, PA  leflunomide (ARAVA) 20 MG tablet Take 20 mg by mouth daily.    Historical Provider, MD  losartan (COZAAR) 50 MG tablet Take 1 tablet (50 mg total) by mouth daily. 03/30/15   Sherren Mocha, MD  metoprolol tartrate (LOPRESSOR) 25 MG tablet TAKE 1 TABLET BY MOUTH TWICE DAILY 07/04/16   Sherren Mocha, MD  nabumetone (RELAFEN) 500 MG tablet Take 500 mg by mouth 2 (two) times daily.     Historical Provider, MD  Omega-3 Fatty Acids (FISH OIL) 1200 MG CAPS Take 1,200 mg by mouth 2 (two) times daily.    Historical Provider, MD  potassium chloride (K-DUR) 10 MEQ tablet Take 1 tablet (10 mEq total) by mouth daily. 03/30/15   Sherren Mocha, MD  predniSONE (DELTASONE) 20 MG tablet 3 tabs po daily x 3 days, then 2 tabs x 3 days, then 1.5 tabs x 3 days, then 1 tab x 3 days, then 0.5 tabs x 3 days 08/13/16   Noland Fordyce, PA-C  rosuvastatin (CRESTOR) 10 MG tablet Take 1 tablet (10 mg total) by mouth daily. 05/11/16   Sherren Mocha, MD  topiramate (TOPAMAX) 50 MG tablet Take 50 mg by mouth 2 (two) times daily.    Historical Provider, MD   Meds Ordered and Administered this Visit  Medications - No data to display  BP 122/78 (BP Location: Left Arm)   Pulse 68   Temp 98.5 F (36.9 C) (Oral)   Wt 172 lb (78 kg)   SpO2 99%   BMI 30.47 kg/m  No data found.   Physical Exam  Constitutional: She is oriented to person, place, and time. She appears well-developed and well-nourished.  HENT:  Head: Normocephalic and atraumatic.  Eyes: EOM are normal.  Neck: Normal range of motion.  Cardiovascular: Normal rate.   Pulses:      Radial pulses are 2+ on the right side.  Pulmonary/Chest: Effort normal.  Musculoskeletal: Normal range of motion. She exhibits tenderness. She exhibits no edema or deformity.  Right wrist: no edema or deformity. Tenderness along radial aspect. Full ROM wrist and fingers. 4/5 grip strength compared to Left.   Neurological: She is alert and oriented to person, place, and time.  Skin: Skin is warm and dry. Capillary refill takes less than 2 seconds.  Right wrist and hand: skin in tact, no ecchymosis or erythema.   Psychiatric: She has a normal mood and affect. Her behavior is normal.  Nursing note and vitals reviewed.   Urgent Care Course   Clinical Course    Procedures (including critical care time)  Labs Review Labs Reviewed - No data to display  Imaging Review Dg Wrist Complete Right  Result Date: 08/13/2016 CLINICAL DATA:  Fall several days ago with wrist pain, initial encounter EXAM: RIGHT WRIST - COMPLETE 3+ VIEW COMPARISON:  None. FINDINGS: There is no evidence of fracture or dislocation. There is no evidence of arthropathy or other focal bone abnormality. Soft tissues are unremarkable. IMPRESSION: No acute abnormality noted. Electronically Signed   By: Inez Catalina M.D.   On: 08/13/2016 10:14    Tympanometry: Left ear- Normal, Right ear- Tympanogram is Wide  MDM   1.  Acute nonsuppurative otitis media of right ear   2. Sore throat   3. Right wrist pain    Pt c/o Right ear and throat pain.  Tympanogram c/w early onset of Right AOM, will start on Augmentin.  Right wrist pain: no evidence of underlying infection. No evidence of fracture or dislocation.  Pt notes she has had success with prednisone tapers in the past for joint pain.  Would like to try. She has a wrist splint at home she can use.  Rx: Augmentin and Prednisone (2 week taper). Advised pt to use acetaminophen and ibuprofen as needed for fever and pain. Encouraged rest and fluids. F/u with PCP in 7-10 days if not improving, sooner if worsening. Pt verbalized understanding and agreement with tx plan.      Noland Fordyce, PA-C 08/13/16 1117

## 2016-08-15 DIAGNOSIS — M65331 Trigger finger, right middle finger: Secondary | ICD-10-CM | POA: Diagnosis not present

## 2016-08-15 DIAGNOSIS — M65332 Trigger finger, left middle finger: Secondary | ICD-10-CM | POA: Diagnosis not present

## 2016-08-16 NOTE — Progress Notes (Signed)
Insulin Pump Evaluation Visit:  Date: 08/08/2016   Appt start time: 1400 end time:  1500.  Assessment:  This patient has DM 1 and their primary concerns today: to learn more about Medtronic 670G insulin pump and CGM with Auto Mode.   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      Intervention:    Taught difference between manual and auto mode available with new system  Explained importance of testing BG at least 4 times per day for appropriate correction of high BG and prevention of DKA as applicable as well as accurate calibration of CGM.  Emphasized importance of follow up after Pump Start for appropriate pump setting adjustments and review of Carelink reports.  Handouts given during visit include:  DM 1 Support Group Flyer  Handout on Grif Grips tape product  Plan: We have discussed the Medtronic 670G and have contacted the company to see what your warranty date is. When your pump does ship, call this office to make an appointment for training We also discussed types of infusion sets and you may want to try the Quick-set 9 mm  I gave you sample flyer for Grif Grips tape which you can order on-line @ [email or web address] Feel free to join our Type 1 Support Group anytime.   Monitoring/Evaluation:    Patient does want to continue with pursuit of Medtronic  670G insulin pump and CGM.  Patient to contact local [place] so they can start the process of obtaining the pump. Contact information provided to the patient.  Patient instructed to complete any learning guide on insulin pump prior to next visit  Once pump is shipped, patient to call this office to set up training.

## 2016-09-13 MED FILL — ISOSORBIDE MN ER 30 MG TAB: 30 | 30 days supply | Qty: 30 | Fill #1

## 2016-09-13 MED FILL — NABUMETONE 750 MG TABLET: 750 | 30 days supply | Qty: 60 | Fill #2

## 2016-09-13 MED FILL — HumaLOG 100 UNIT/ML SOLN: 100 | 89 days supply | Qty: 80 | Fill #1

## 2016-09-13 MED FILL — ONE TOUCH ULTRA TEST STRIPS: 90 days supply | Qty: 400 | Fill #1

## 2016-09-13 MED FILL — LEFLUNOMIDE 20 MG TABLET: 20 | 90 days supply | Qty: 90 | Fill #0

## 2016-09-13 MED FILL — ROSUVASTATIN CALCIUM 10 MG: 10 | 90 days supply | Qty: 90 | Fill #1

## 2016-09-14 DIAGNOSIS — Z79899 Other long term (current) drug therapy: Secondary | ICD-10-CM | POA: Diagnosis not present

## 2016-09-17 ENCOUNTER — Other Ambulatory Visit: Payer: Self-pay | Admitting: Pharmacist

## 2016-09-17 ENCOUNTER — Other Ambulatory Visit: Payer: Self-pay

## 2016-09-17 DIAGNOSIS — I10 Essential (primary) hypertension: Secondary | ICD-10-CM

## 2016-09-17 MED ORDER — GOLIMUMAB 50 MG/0.5ML ~~LOC~~ SOSY: 50.0000 mg | PREFILLED_SYRINGE | SUBCUTANEOUS | 1 refills | Status: DC

## 2016-09-17 MED ORDER — GOLIMUMAB 100 MG/ML ~~LOC~~ SOAJ: 100.0000 mg | SUBCUTANEOUS | 1 refills | Status: DC

## 2016-09-17 MED ORDER — LOSARTAN POTASSIUM 50 MG PO TABS: 50.0000 mg | ORAL_TABLET | Freq: Every day | ORAL | 3 refills | Status: DC

## 2016-09-17 MED FILL — LOSARTAN POTASSIUM 50 MG TA: 50 | 90 days supply | Qty: 90 | Fill #0

## 2016-09-17 MED FILL — SIMPONI 50 MG/0.5ML SOSY: 50 | 30 days supply | Qty: 1 | Fill #0

## 2016-09-25 DIAGNOSIS — E1065 Type 1 diabetes mellitus with hyperglycemia: Secondary | ICD-10-CM | POA: Diagnosis not present

## 2016-09-25 DIAGNOSIS — E0500 Thyrotoxicosis with diffuse goiter without thyrotoxic crisis or storm: Secondary | ICD-10-CM | POA: Diagnosis not present

## 2016-09-25 DIAGNOSIS — E10319 Type 1 diabetes mellitus with unspecified diabetic retinopathy without macular edema: Secondary | ICD-10-CM | POA: Diagnosis not present

## 2016-09-27 ENCOUNTER — Ambulatory Visit: Payer: 59 | Admitting: Physician Assistant

## 2016-09-27 ENCOUNTER — Emergency Department
Admission: EM | Admit: 2016-09-27 | Discharge: 2016-09-27 | Disposition: A | Discharge status: Home / Self Care | Payer: 59 | Source: Home / Self Care | Attending: Emergency Medicine | Admitting: Emergency Medicine

## 2016-09-27 ENCOUNTER — Encounter: Payer: Self-pay | Admitting: Emergency Medicine

## 2016-09-27 DIAGNOSIS — K119 Disease of salivary gland, unspecified: Secondary | ICD-10-CM | POA: Diagnosis not present

## 2016-09-27 MED ORDER — AMOXICILLIN 500 MG PO CAPS: 500.0000 mg | ORAL_CAPSULE | Freq: Three times a day (TID) | ORAL | 0 refills | Status: DC

## 2016-09-27 NOTE — ED Triage Notes (Signed)
Right side facial swelling today noticed while eating lunch, jaw hurts when chewing, denies teeth pain

## 2016-09-27 NOTE — ED Provider Notes (Signed)
CSN: [id number]     Arrival date & time 09/27/16  1748 History   None    Chief Complaint  Patient presents with  . Facial Swelling   (Consider location/radiation/quality/duration/timing/severity/associated sxs/prior Treatment) The history is provided by the patient. No language interpreter was used.  Pt complains of pain in her left face.  Pt reports face began swelling today,  Became hard and painful.   Pt had an ear infection recently.   Past Medical History:  Diagnosis Date  . ACUT MI ANTEROLAT WALL SUBSQT EPIS CARE   . Acute maxillary sinusitis   . ALLERGIC RHINITIS, SEASONAL   . ARTHRITIS, RHEUMATOID   . Atrial fibrillation ([place])    a. after CABG.  . CAD, ARTERY BYPASS GRAFT    a. DES to RCA in 2010 then LAD occlusion s/p CABG 3 06/07/2009 with LIMA to LAD, reverse SVG to D1, reverse SVG to distal RCA. b. Cath 05/08/2016 slightly hypodense region in the intermediate branch, however she had excellent flow, FFR was normal. Vein graft to PDA and the posterolateral branch is patent, patent LIMA to LAD, occluded SVG to diagonal.  . CARPAL TUNNEL SYNDROME, BILATERAL   . CHOLELITHIASIS   . Contrast media allergy   . DERMATITIS, ALLERGIC   . DIABETES MELLITUS, TYPE I    on insulin pump  . DIABETIC  RETINOPATHY   . HYPERLIPIDEMIA-MIXED   . HYPERTHYROIDISM   . Insulin pump in place   . MIGRAINE W/O AURA W/INTRACT W/STATUS MIGRAINOSUS 02/19/2008  . SINUS TACHYCARDIA 11/08/2010  . TRIGGER FINGER   . URI    Past Surgical History:  Procedure Laterality Date  . Caesarean section    . CARDIAC CATHETERIZATION N/A 05/08/2016   Procedure: Left Heart Cath and Cors/Grafts Angiography;  Surgeon: Burnell Blanks, MD;  Location: [facility];  Service: Cardiovascular;  Laterality: N/A;  . CARPAL TUNNEL RELEASE    . CHOLECYSTECTOMY    . CORONARY ARTERY BYPASS GRAFT    . VITRECTOMY     Family History  Problem Relation Age of Onset  . Diabetes    . Heart disease    . Hypertension     . Hyperlipidemia    . Depression    . Migraines    . Stroke Paternal Grandmother   . Heart attack Neg Hx    Social History  Substance Use Topics  . Smoking status: Never Smoker  . Smokeless tobacco: Never Used  . Alcohol use No   OB History    No data available     Review of Systems  All other systems reviewed and are negative.   Allergies  Prochlorperazine; Ramipril; Shellfish-derived products; Atorvastatin; Etanercept; Infliximab; Orencia [abatacept]; Shellfish allergy; Tofacitinib; Prochlorperazine edisylate; Amiodarone; and Rituximab  Home Medications   Prior to Admission medications   Medication Sig Start Date End Date Taking? Authorizing Provider  amoxicillin (AMOXIL) 500 MG capsule Take 1 capsule (500 mg total) by mouth 3 (three) times daily. 09/27/16   Fransico Meadow, PA-C  aspirin 81 MG tablet Take 81 mg by mouth daily.     Historical Provider, MD  clopidogrel (PLAVIX) 75 MG tablet Take 1 tablet (75 mg total) by mouth daily. 05/09/16   Almyra Deforest, PA  cyclobenzaprine (FLEXERIL) 10 MG tablet Take 10 mg by mouth at bedtime.  10/04/14   Historical Provider, MD  EPINEPHrine 0.15 MG/0.15ML SOAJ Inject 0.15 mg into the skin as needed. Inject as directed.    Historical Provider, MD  furosemide (LASIX)  40 MG tablet Take 40 mg by mouth daily as needed for edema (When she feels like she is swelling).    Historical Provider, MD  Golimumab 50 MG/0.5ML SOSY Inject 50 mg into the skin every 30 (thirty) days. 09/17/16   Olugbemiga Essie Christine, MD  insulin lispro (HUMALOG) 100 UNIT/ML injection FOR USE IN INSULIN PUMP. TOTAL DAILY INSULIN DOSE = UP TO 90 UNITS. 03/24/15   Historical Provider, MD  isosorbide mononitrate (IMDUR) 30 MG 24 hr tablet Take 0.5 tablets (15 mg total) by mouth daily. 05/09/16   Almyra Deforest, PA  leflunomide (ARAVA) 20 MG tablet Take 20 mg by mouth daily.    Historical Provider, MD  losartan (COZAAR) 50 MG tablet Take 1 tablet (50 mg total) by mouth daily. 09/17/16   Sherren Mocha, MD  metoprolol tartrate (LOPRESSOR) 25 MG tablet TAKE 1 TABLET BY MOUTH TWICE DAILY 07/04/16   Sherren Mocha, MD  nabumetone (RELAFEN) 500 MG tablet Take 500 mg by mouth 2 (two) times daily.     Historical Provider, MD  Omega-3 Fatty Acids (FISH OIL) 1200 MG CAPS Take 1,200 mg by mouth 2 (two) times daily.    Historical Provider, MD  potassium chloride (K-DUR) 10 MEQ tablet Take 1 tablet (10 mEq total) by mouth daily. 03/30/15   Sherren Mocha, MD  predniSONE (DELTASONE) 20 MG tablet 3 tabs po daily x 3 days, then 2 tabs x 3 days, then 1.5 tabs x 3 days, then 1 tab x 3 days, then 0.5 tabs x 3 days 08/13/16   Noland Fordyce, PA-C  rosuvastatin (CRESTOR) 10 MG tablet Take 1 tablet (10 mg total) by mouth daily. 05/11/16   Sherren Mocha, MD  topiramate (TOPAMAX) 50 MG tablet Take 50 mg by mouth 2 (two) times daily.    Historical Provider, MD   Meds Ordered and Administered this Visit  Medications - No data to display  BP 141/86 (BP Location: Left Arm)   Pulse 84   Temp 97.6 F (36.4 C) (Oral)   Ht 5\' 3"  (1.6 m)   Wt 173 lb (78.5 kg)   SpO2 100%   BMI 30.65 kg/m  No data found.   Physical Exam  Constitutional: She is oriented to person, place, and time. She appears well-developed and well-nourished.  HENT:  Head: Normocephalic.  Right Ear: External ear normal.  Left Ear: External ear normal.  Nose: Nose normal.  Small red area right buccal area,  Palpable enlargement to salivary gland.   Eyes: EOM are normal.  Neck: Normal range of motion.  Pulmonary/Chest: Effort normal.  Abdominal: She exhibits no distension.  Musculoskeletal: Normal range of motion.  Neurological: She is alert and oriented to person, place, and time.  Psychiatric: She has a normal mood and affect.  Nursing note and vitals reviewed.   Urgent Care Course   Clinical Course    Procedures (including critical care time)  Labs Review Labs Reviewed - No data to display  Imaging Review No results  found.   Visual Acuity Review  Right Eye Distance:   Left Eye Distance:   Bilateral Distance:    Right Eye Near:   Left Eye Near:    Bilateral Near:         MDM   1. Salivary gland disturbance    Pt counseled on salivary duct stones/obstructions.  Pt advised lemon drops (sugar free) Pt given amoxicillian.  Pt advised to contact me if any problems.  Hospital OR nurse.  Meds ordered this  encounter  Medications  . DISCONTD: amoxicillin (AMOXIL) 500 MG capsule    Sig: Take 1 capsule (500 mg total) by mouth 3 (three) times daily.    Dispense:  30 capsule    Refill:  0    Order Specific Question:   Supervising Provider    Answer:   Burnett Harry, DAVID [[id number]]  . amoxicillin (AMOXIL) 500 MG capsule    Sig: Take 1 capsule (500 mg total) by mouth 3 (three) times daily.    Dispense:  30 capsule    Refill:  0    Order Specific Question:   Supervising Provider    Answer:   Burnett Harry, DAVID [[id number]]  An After Visit Summary was printed and given to the patient.   [place], PA-C 09/27/16 1839

## 2016-09-29 ENCOUNTER — Telehealth: Payer: Self-pay | Admitting: Emergency Medicine

## 2016-09-29 NOTE — Telephone Encounter (Signed)
Patient states she is much better   

## 2016-10-05 ENCOUNTER — Ambulatory Visit: Payer: 59 | Admitting: Physician Assistant

## 2016-10-09 ENCOUNTER — Other Ambulatory Visit: Payer: Self-pay | Admitting: Pharmacist

## 2016-10-09 MED ORDER — GOLIMUMAB 100 MG/ML ~~LOC~~ SOAJ: SUBCUTANEOUS | 0 refills | Status: DC

## 2016-10-09 MED FILL — SIMPONI 100 MG/ML SOAJ: 100 | 28 days supply | Qty: 1 | Fill #0

## 2016-10-15 MED FILL — METOPROLOL TARTRATE 25 MG T: 25 | 90 days supply | Qty: 180 | Fill #1

## 2016-10-15 MED FILL — NABUMETONE 750 MG TABLET: 750 | 30 days supply | Qty: 60 | Fill #3

## 2016-10-25 MED FILL — CLOPIDOGREL 75 MG TABLET: 75 | 90 days supply | Qty: 90 | Fill #2

## 2016-10-25 MED FILL — ISOSORBIDE MN ER 30 MG TAB: 30 | 30 days supply | Qty: 30 | Fill #2

## 2016-10-25 MED FILL — TOPIRAMATE 50 MG TABLET: 50 | 90 days supply | Qty: 180 | Fill #1

## 2016-10-30 ENCOUNTER — Encounter (INDEPENDENT_AMBULATORY_CARE_PROVIDER_SITE_OTHER): Payer: 59 | Admitting: Ophthalmology

## 2016-10-30 DIAGNOSIS — E10319 Type 1 diabetes mellitus with unspecified diabetic retinopathy without macular edema: Secondary | ICD-10-CM

## 2016-10-30 DIAGNOSIS — H43813 Vitreous degeneration, bilateral: Secondary | ICD-10-CM

## 2016-10-30 DIAGNOSIS — I10 Essential (primary) hypertension: Secondary | ICD-10-CM | POA: Diagnosis not present

## 2016-10-30 DIAGNOSIS — E103513 Type 1 diabetes mellitus with proliferative diabetic retinopathy with macular edema, bilateral: Secondary | ICD-10-CM

## 2016-10-30 DIAGNOSIS — H35033 Hypertensive retinopathy, bilateral: Secondary | ICD-10-CM

## 2016-11-02 ENCOUNTER — Telehealth: Payer: Self-pay | Admitting: Rheumatology

## 2016-11-02 NOTE — Telephone Encounter (Signed)
Last visit 07/06/16 Next visit 12/10/16  She is on Simponi for her RA C/o flare has asked for prednisone Last short taper (3d.  15mg  10mg  5mg  2.5mg ) was sent in June (short taper she has diabetes)  Ok to send prednisone for her flare ?

## 2016-11-02 NOTE — Telephone Encounter (Signed)
Patient is having aches and pains and would like an rx for prednisone.   Please send to [facility] in [place] on [address].

## 2016-11-02 NOTE — Telephone Encounter (Signed)
K to repeat the taper.

## 2016-11-03 MED ORDER — PREDNISONE 5 MG PO TABS: ORAL_TABLET | ORAL | 0 refills | Status: DC

## 2016-11-03 MED ORDER — PREDNISONE 20 MG PO TABS: ORAL_TABLET | ORAL | 0 refills | Status: DC

## 2016-11-03 NOTE — Telephone Encounter (Signed)
Called patient / prednisone sent

## 2016-11-12 ENCOUNTER — Encounter: Payer: Self-pay | Admitting: Radiology

## 2016-11-12 NOTE — Progress Notes (Unsigned)
other

## 2016-11-19 ENCOUNTER — Other Ambulatory Visit: Payer: Self-pay | Admitting: Rheumatology

## 2016-11-19 ENCOUNTER — Other Ambulatory Visit (INDEPENDENT_AMBULATORY_CARE_PROVIDER_SITE_OTHER): Payer: 59 | Admitting: Ophthalmology

## 2016-11-19 DIAGNOSIS — H26491 Other secondary cataract, right eye: Secondary | ICD-10-CM | POA: Diagnosis not present

## 2016-11-19 MED FILL — NABUMETONE 750 MG TABLET: 750 | 30 days supply | Qty: 60 | Fill #0

## 2016-11-19 MED FILL — LOTEMAX 0.5% GEL: 0.5 | 30 days supply | Qty: 5 | Fill #0

## 2016-11-19 MED FILL — PANTOPRAZOLE SOD DR 40 MG T: 40 | 90 days supply | Qty: 90 | Fill #2

## 2016-11-19 MED FILL — FOLIC ACID 1 MG TABLET: 1 | 90 days supply | Qty: 180 | Fill #1

## 2016-11-19 MED FILL — SIMPONI 100 MG/ML SOAJ: 100 | 28 days supply | Qty: 1 | Fill #1

## 2016-11-19 MED FILL — ISOSORBIDE MN ER 30 MG TAB: 30 | 30 days supply | Qty: 30 | Fill #3

## 2016-11-19 NOTE — Telephone Encounter (Signed)
07/09/16 last visit  Message sent for appt sch In Feb 09/14/16 labs WNL  Ok to refill per Mr [name]

## 2016-11-20 MED FILL — ONE TOUCH ULTRA TEST STRIPS: 90 days supply | Qty: 400 | Fill #0

## 2016-12-10 ENCOUNTER — Ambulatory Visit: Payer: Self-pay | Admitting: Rheumatology

## 2016-12-13 MED FILL — SIMPONI 100 MG/ML SOAJ: 100 | 28 days supply | Qty: 1 | Fill #2

## 2016-12-17 ENCOUNTER — Encounter (INDEPENDENT_AMBULATORY_CARE_PROVIDER_SITE_OTHER): Payer: 59 | Admitting: Ophthalmology

## 2016-12-17 DIAGNOSIS — H2701 Aphakia, right eye: Secondary | ICD-10-CM

## 2016-12-20 MED FILL — NABUMETONE 750 MG TABLET: 750 | 30 days supply | Qty: 60 | Fill #1

## 2016-12-20 MED FILL — ISOSORBIDE MN ER 30 MG TAB: 30 | 30 days supply | Qty: 30 | Fill #4

## 2016-12-27 ENCOUNTER — Emergency Department
Admission: EM | Admit: 2016-12-27 | Discharge: 2016-12-27 | Disposition: A | Discharge status: Home / Self Care | Payer: 59 | Source: Home / Self Care | Attending: Family Medicine | Admitting: Family Medicine

## 2016-12-27 ENCOUNTER — Emergency Department (INDEPENDENT_AMBULATORY_CARE_PROVIDER_SITE_OTHER): Payer: 59

## 2016-12-27 ENCOUNTER — Encounter: Payer: Self-pay | Admitting: *Deleted

## 2016-12-27 DIAGNOSIS — R062 Wheezing: Secondary | ICD-10-CM

## 2016-12-27 DIAGNOSIS — J9801 Acute bronchospasm: Secondary | ICD-10-CM

## 2016-12-27 DIAGNOSIS — R058 Other specified cough: Secondary | ICD-10-CM

## 2016-12-27 DIAGNOSIS — B9789 Other viral agents as the cause of diseases classified elsewhere: Secondary | ICD-10-CM | POA: Diagnosis not present

## 2016-12-27 DIAGNOSIS — J069 Acute upper respiratory infection, unspecified: Secondary | ICD-10-CM | POA: Diagnosis not present

## 2016-12-27 DIAGNOSIS — R05 Cough: Secondary | ICD-10-CM | POA: Diagnosis not present

## 2016-12-27 MED ORDER — IPRATROPIUM-ALBUTEROL 0.5-2.5 (3) MG/3ML IN SOLN: 3.0000 mL | RESPIRATORY_TRACT | Status: DC

## 2016-12-27 MED ORDER — PREDNISONE 10 MG PO TABS: ORAL_TABLET | ORAL | 0 refills | Status: DC

## 2016-12-27 MED ORDER — DOXYCYCLINE HYCLATE 100 MG PO CAPS: 100.0000 mg | ORAL_CAPSULE | Freq: Two times a day (BID) | ORAL | 0 refills | Status: DC

## 2016-12-27 MED ORDER — GUAIFENESIN-CODEINE 100-10 MG/5ML PO SOLN: ORAL | 0 refills | Status: DC

## 2016-12-27 NOTE — Discharge Instructions (Signed)
Take plain guaifenesin (1200mg  extended release tabs such as Mucinex) twice daily, with plenty of water, for cough and congestion.  May continue Tylenol Sinus for sinus congestion.  Get adequate rest.   May use Afrin nasal spray (or generic oxymetazoline) twice daily for about 5 days and then discontinue.  Also recommend using saline nasal spray several times daily and saline nasal irrigation (AYR is a common brand).  Use Flonase nasal spray each morning after using Afrin nasal spray and saline nasal irrigation. Try warm salt water gargles for sore throat.  Stop all antihistamines for now, and other non-prescription cough/cold preparations. Begin Doxycycline if not improving about one week or if persistent fever develops  Follow-up with family doctor if not improving about10 days.

## 2016-12-27 NOTE — ED Triage Notes (Signed)
Pt c/o cough x 2 wks; producitve at times: sweats 3 nights ago. She is unsure if she has had a fever.

## 2016-12-27 NOTE — ED Provider Notes (Signed)
[name] CARE    CSN: [id number] Arrival date & time: 12/27/16  1554     History   Chief Complaint Chief Complaint  Patient presents with  . Cough    HPI [name] is a 50 y.o. female.   Patient has a history of seasonal rhinitis and two weeks ago developed mild sore throat, sinus congestion, and cough.  During the past several days she has had sweats and complains of persistent fatigue.  She has developed wheezing with activity.  No fever or pleuritic pain.   The history is provided by the patient.    Past Medical History:  Diagnosis Date  . ACUT MI ANTEROLAT WALL SUBSQT EPIS CARE   . Acute maxillary sinusitis   . ALLERGIC RHINITIS, SEASONAL   . ARTHRITIS, RHEUMATOID   . Atrial fibrillation ([place])    a. after CABG.  . CAD, ARTERY BYPASS GRAFT    a. DES to RCA in 2010 then LAD occlusion s/p CABG 3 06/07/2009 with LIMA to LAD, reverse SVG to D1, reverse SVG to distal RCA. b. Cath 05/08/2016 slightly hypodense region in the intermediate branch, however she had excellent flow, FFR was normal. Vein graft to PDA and the posterolateral branch is patent, patent LIMA to LAD, occluded SVG to diagonal.  . CARPAL TUNNEL SYNDROME, BILATERAL   . CHOLELITHIASIS   . Contrast media allergy   . DERMATITIS, ALLERGIC   . DIABETES MELLITUS, TYPE I    on insulin pump  . DIABETIC  RETINOPATHY   . HYPERLIPIDEMIA-MIXED   . HYPERTHYROIDISM   . Insulin pump in place   . MIGRAINE W/O AURA W/INTRACT W/STATUS MIGRAINOSUS 02/19/2008  . SINUS TACHYCARDIA 11/08/2010  . TRIGGER FINGER   . URI     Patient Active Problem List   Diagnosis Date Noted  . Chest pain syndrome 05/08/2016  . Hypertension, essential 05/08/2016  . Hyperlipidemia, mixed 05/08/2016  . Diabetes mellitus with no complication ([place]) [id number]  . Coronary artery disease involving native coronary artery of native heart with angina pectoris ([place])   . HYPERTHYROIDISM 11/09/2010  . SINUS TACHYCARDIA 11/08/2010  .  SHORTNESS OF BREATH 11/08/2010  . DIARRHEA 11/08/2010  . ACUTE MAXILLARY SINUSITIS 10/16/2010  . DERMATITIS, ALLERGIC 07/20/2010  . EDEMA 07/12/2010  . DIZZINESS 11/14/2009  . ATRIAL FIBRILLATION 07/20/2009  . CAD, ARTERY BYPASS GRAFT 06/07/2009  . ANGINA, STABLE/EXERTIONAL 06/02/2009  . HYPERLIPIDEMIA-MIXED 04/26/2009  . ACUT MI ANTEROLAT WALL SUBSQT EPIS CARE 04/26/2009  . CHEST PAIN 04/26/2009  . DIABETIC  RETINOPATHY 04/25/2009  . CARPAL TUNNEL SYNDROME, BILATERAL 04/25/2009  . TRIGGER FINGER 04/25/2009  . MIGRAINE W/O AURA W/INTRACT W/STATUS MIGRAINOSUS 02/19/2008  . COMMON MIGRAINE 01/24/2007  . URI 01/24/2007  . DIABETES MELLITUS, TYPE I 10/16/2006  . ESSENTIAL HYPERTENSION 10/16/2006  . ALLERGIC RHINITIS, SEASONAL 10/16/2006  . CHOLELITHIASIS 10/16/2006  . ARTHRITIS, RHEUMATOID 10/16/2006    Past Surgical History:  Procedure Laterality Date  . ABDOMINAL HYSTERECTOMY    . Caesarean section    . CARDIAC CATHETERIZATION N/A 05/08/2016   Procedure: Left Heart Cath and Cors/Grafts Angiography;  Surgeon: Burnell Blanks, MD;  Location: [facility];  Service: Cardiovascular;  Laterality: N/A;  . CARPAL TUNNEL RELEASE    . CHOLECYSTECTOMY    . CORONARY ARTERY BYPASS GRAFT    . VITRECTOMY      OB History    No data available       Home Medications    Prior to Admission medications   Medication  Sig Start Date End Date Taking? Authorizing Provider  aspirin 81 MG tablet Take 81 mg by mouth daily.     Historical Provider, MD  clopidogrel (PLAVIX) 75 MG tablet Take 1 tablet (75 mg total) by mouth daily. 05/09/16   Almyra Deforest, PA  cyclobenzaprine (FLEXERIL) 10 MG tablet Take 10 mg by mouth at bedtime.  10/04/14   Historical Provider, MD  doxycycline (VIBRAMYCIN) 100 MG capsule Take 1 capsule (100 mg total) by mouth 2 (two) times daily. Take with food (Rx void after 01/04/17) 12/27/16   Kandra Nicolas, MD  EPINEPHrine 0.15 MG/0.15ML SOAJ Inject 0.15 mg into the skin  as needed. Inject as directed.    Historical Provider, MD  furosemide (LASIX) 40 MG tablet Take 40 mg by mouth daily as needed for edema (When she feels like she is swelling).    Historical Provider, MD  Golimumab 100 MG/ML SOAJ Inject one device under the skin once monthly 10/09/16   Tresa Garter, MD  guaiFENesin-codeine 100-10 MG/5ML syrup Take 26mL by mouth at bedtime as needed for cough 12/27/16   Kandra Nicolas, MD  insulin lispro (HUMALOG) 100 UNIT/ML injection FOR USE IN INSULIN PUMP. TOTAL DAILY INSULIN DOSE = UP TO 90 UNITS. 03/24/15   Historical Provider, MD  isosorbide mononitrate (IMDUR) 30 MG 24 hr tablet Take 0.5 tablets (15 mg total) by mouth daily. 05/09/16   Almyra Deforest, PA  leflunomide (ARAVA) 20 MG tablet Take 20 mg by mouth daily.    Historical Provider, MD  losartan (COZAAR) 50 MG tablet Take 1 tablet (50 mg total) by mouth daily. 09/17/16   Sherren Mocha, MD  metoprolol tartrate (LOPRESSOR) 25 MG tablet TAKE 1 TABLET BY MOUTH TWICE DAILY 07/04/16   Sherren Mocha, MD  nabumetone (RELAFEN) 750 MG tablet TAKE 1 TABLET BY MOUTH TWICE DAILY 11/19/16   Naitik Panwala, PA-C  Omega-3 Fatty Acids (FISH OIL) 1200 MG CAPS Take 1,200 mg by mouth 2 (two) times daily.    Historical Provider, MD  potassium chloride (K-DUR) 10 MEQ tablet Take 1 tablet (10 mEq total) by mouth daily. 03/30/15   Sherren Mocha, MD  predniSONE (DELTASONE) 10 MG tablet Take one tab by mouth twice daily for 5 days, then one daily.  Take with food. 12/27/16   Kandra Nicolas, MD  rosuvastatin (CRESTOR) 10 MG tablet Take 1 tablet (10 mg total) by mouth daily. 05/11/16   Sherren Mocha, MD  topiramate (TOPAMAX) 50 MG tablet Take 50 mg by mouth 2 (two) times daily.    Historical Provider, MD    Family History Family History  Problem Relation Age of Onset  . Diabetes    . Heart disease    . Hypertension    . Hyperlipidemia    . Depression    . Migraines    . Stroke Paternal Grandmother   . Heart attack Neg Hx       Social History Social History  Substance Use Topics  . Smoking status: Never Smoker  . Smokeless tobacco: Never Used  . Alcohol use No     Allergies   Prochlorperazine; Ramipril; Shellfish-derived products; Atorvastatin; Etanercept; Infliximab; Orencia [abatacept]; Shellfish allergy; Tofacitinib; Prochlorperazine edisylate; Amiodarone; and Rituximab   Review of Systems Review of Systems  + sore throat + cough No pleuritic pain + wheezing + nasal congestion + post-nasal drainage No sinus pain/pressure No itchy/red eyes No earache No hemoptysis No SOB No fever, + chills/sweats No nausea No vomiting No abdominal pain No  diarrhea No urinary symptoms No skin rash + fatigue No myalgias No headache Used OTC meds without relief    Physical Exam Triage Vital Signs ED Triage Vitals  Enc Vitals Group     BP 12/27/16 1640 133/73     Pulse Rate 12/27/16 1640 76     Resp 12/27/16 1640 16     Temp 12/27/16 1640 98.3 F (36.8 C)     Temp Source 12/27/16 1640 Oral     SpO2 12/27/16 1640 98 %     Weight 12/27/16 1640 171 lb (77.6 kg)     Height 12/27/16 1640 5\' 3"  (1.6 m)     Head Circumference --      Peak Flow --      Pain Score 12/27/16 1643 0     Pain Loc --      Pain Edu? --      Excl. in [place]? --    No data found.   Updated Vital Signs BP 133/73 (BP Location: Left Arm)   Pulse 76   Temp 98.3 F (36.8 C) (Oral)   Resp 16   Ht 5\' 3"  (1.6 m)   Wt 171 lb (77.6 kg)   SpO2 98%   BMI 30.29 kg/m   Visual Acuity Right Eye Distance:   Left Eye Distance:   Bilateral Distance:    Right Eye Near:   Left Eye Near:    Bilateral Near:     Physical Exam Nursing notes and Vital Signs reviewed. Appearance:  Patient appears stated age, and in no acute distress Eyes:  Pupils are equal, round, and reactive to light and accomodation.  Extraocular movement is intact.  Conjunctivae are not inflamed  Ears:  Canals normal.  Tympanic membranes normal.  Nose:   Mildly congested turbinates.  No sinus tenderness.   Pharynx:  Normal Neck:  Supple.  Tender enlarged posterior/lateral nodes are palpated bilaterally  Lungs:  Clear to auscultation.  Breath sounds are equal.  Moving air well. Heart:  Regular rate and rhythm without murmurs, rubs, or gallops.  Abdomen:  Nontender without masses or hepatosplenomegaly.  Bowel sounds are present.  No CVA or flank tenderness.  Extremities:  No edema.  Skin:  No rash present.    UC Treatments / Results  Labs (all labs ordered are listed, but only abnormal results are displayed) Labs Reviewed - No data to display  EKG  EKG Interpretation None       Radiology Dg Chest 2 View  Result Date: 12/27/2016 CLINICAL DATA:  50 year old female with productive cough and clear sputum. EXAM: CHEST  2 VIEW COMPARISON:  05/07/2016 FINDINGS: The heart size and mediastinal contours are within normal limits. Both lungs are clear. The visualized skeletal structures are unremarkable. IMPRESSION: No active cardiopulmonary disease. Electronically Signed   By: Kerby Moors M.D.   On: 12/27/2016 17:32    Procedures Procedures (including critical care time)  Medications Ordered in UC Medications - No data to display   Initial Impression / Assessment and Plan / UC Course  I have reviewed the triage vital signs and the nursing notes.  Pertinent labs & imaging results that were available during my care of the patient were reviewed by me and considered in my medical decision making (see chart for details).  Clinical Course   There is no evidence of bacterial infection today.   Begin prednisone burst/taper.  Rx for Robitussin AC for night time cough.  Take plain guaifenesin (1200mg  extended release tabs such as  Mucinex) twice daily, with plenty of water, for cough and congestion.  May continue Tylenol Sinus for sinus congestion.  Get adequate rest.   May use Afrin nasal spray (or generic oxymetazoline) twice daily for about  5 days and then discontinue.  Also recommend using saline nasal spray several times daily and saline nasal irrigation (AYR is a common brand).  Use Flonase nasal spray each morning after using Afrin nasal spray and saline nasal irrigation. Try warm salt water gargles for sore throat.  Stop all antihistamines for now, and other non-prescription cough/cold preparations. Begin Doxycycline if not improving about one week or if persistent fever develops (Given a prescription to hold, with an expiration date)  Follow-up with family doctor if not improving about10 days.      Final Clinical Impressions(s) / UC Diagnoses   Final diagnoses:  Wheezing  Cough productive of purulent sputum  Viral URI with cough  Bronchospasm    New Prescriptions New Prescriptions   DOXYCYCLINE (VIBRAMYCIN) 100 MG CAPSULE    Take 1 capsule (100 mg total) by mouth 2 (two) times daily. Take with food (Rx void after 01/04/17)   GUAIFENESIN-CODEINE 100-10 MG/5ML SYRUP    Take 71mL by mouth at bedtime as needed for cough   PREDNISONE (DELTASONE) 10 MG TABLET    Take one tab by mouth twice daily for 5 days, then one daily.  Take with food.     Kandra Nicolas, MD 01/13/17 1331

## 2017-01-01 ENCOUNTER — Ambulatory Visit (INDEPENDENT_AMBULATORY_CARE_PROVIDER_SITE_OTHER): Payer: Self-pay

## 2017-01-01 ENCOUNTER — Ambulatory Visit (INDEPENDENT_AMBULATORY_CARE_PROVIDER_SITE_OTHER): Payer: 59 | Admitting: Rheumatology

## 2017-01-01 ENCOUNTER — Encounter: Payer: Self-pay | Admitting: Rheumatology

## 2017-01-01 ENCOUNTER — Other Ambulatory Visit: Payer: Self-pay | Admitting: Radiology

## 2017-01-01 ENCOUNTER — Other Ambulatory Visit: Payer: Self-pay | Admitting: *Deleted

## 2017-01-01 VITALS — BP 132/66 | HR 64 | Resp 12 | Ht 63.0 in | Wt 176.0 lb

## 2017-01-01 DIAGNOSIS — I10 Essential (primary) hypertension: Secondary | ICD-10-CM | POA: Diagnosis not present

## 2017-01-01 DIAGNOSIS — M0609 Rheumatoid arthritis without rheumatoid factor, multiple sites: Secondary | ICD-10-CM | POA: Diagnosis not present

## 2017-01-01 DIAGNOSIS — I4891 Unspecified atrial fibrillation: Secondary | ICD-10-CM

## 2017-01-01 DIAGNOSIS — Z9225 Personal history of immunosupression therapy: Secondary | ICD-10-CM

## 2017-01-01 DIAGNOSIS — E119 Type 2 diabetes mellitus without complications: Secondary | ICD-10-CM | POA: Diagnosis not present

## 2017-01-01 DIAGNOSIS — G5603 Carpal tunnel syndrome, bilateral upper limbs: Secondary | ICD-10-CM

## 2017-01-01 DIAGNOSIS — L409 Psoriasis, unspecified: Secondary | ICD-10-CM | POA: Diagnosis not present

## 2017-01-01 DIAGNOSIS — E782 Mixed hyperlipidemia: Secondary | ICD-10-CM

## 2017-01-01 DIAGNOSIS — Z79899 Other long term (current) drug therapy: Secondary | ICD-10-CM

## 2017-01-01 DIAGNOSIS — I25119 Atherosclerotic heart disease of native coronary artery with unspecified angina pectoris: Secondary | ICD-10-CM | POA: Diagnosis not present

## 2017-01-01 MED ORDER — CYCLOBENZAPRINE HCL 10 MG PO TABS: 10.0000 mg | ORAL_TABLET | Freq: Every day | ORAL | 5 refills | Status: DC

## 2017-01-01 MED FILL — CYCLOBENZAPRINE 10 MG TAB: 10 | 30 days supply | Qty: 30 | Fill #0

## 2017-01-01 NOTE — Progress Notes (Signed)
Rheumatology Medication Review by a Pharmacist Does the patient feel that his/her medications are working for him/her?  Yes Has the patient been experiencing any side effects to the medications prescribed?  No Does the patient have any problems obtaining medications?  No  Issues to address at subsequent visits: None   Pharmacist comments:  [name] is a pleasant 51 yo F who presents for follow up of her rheumatoid arthritis.  She is currently taking Simponi 100 mg every 30 days, Arava 20 mg daily, and hydroxychloroquine 200 mg daily.  Her most recent standing labs were on 09/14/16 which were normal.  She is currently due for standing labs.  Her most recent TB Gold was on 12/02/15 which was negative.  She is due for TB Gold as this time.  Patient reports she had TB Gold done through her work.  Asked patient to send [place] those results.  She confirms she will go by there today and request those results.  Patient reports her most recent hydroxychloroquine eye exam was in November 2017 with Dr. West Pugh office and she reports it was normal.  Will request those results from Dr. West Pugh office.  She denies any questions regarding her medications at this time.    Elisabeth Most, Pharm.D., BCPS Clinical Pharmacist Pager: [phone] Phone: [phone] 01/01/2017 3:25 PM

## 2017-01-01 NOTE — Patient Outreach (Signed)
Message left on Shakiyah's mobile number requesting she call or e-mail this RNCM to schedule routine Link To Wellness Type I DM follow  up office visit. Chonte has not been seen since her initial Link To Wellness enrollment appointment on 07/31/16.  Await return call.  Barrington Ellison RN,CCM,CDE [place] Management Coordinator Link To Wellness Office Phone [phone] Office Fax [phone]

## 2017-01-01 NOTE — Progress Notes (Signed)
Office Visit Note  Patient: [name]             Date of Birth: Aug 03, 1966           MRN: [id number]             PCP: Dionicia Abler, MD Referring: Dionicia Abler, MD Visit Date: 01/01/2017 Occupation: @GUAROCC @    Subjective:  Pain hands   History of Present Illness: [name] is a 51 y.o. female with history of seronegative rheumatoid arthritis. She states she continues to have some discomfort in her hands knee joints and right shoulder. She is currently on prednisone 20 mg by mouth daily for upper respiratory tract infection. Her infection is improving without the use of antibiotics.  Activities of Daily Living:  Patient reports morning stiffness for 20 minutes.   Patient Reports nocturnal pain.  Difficulty dressing/grooming: Denies Difficulty climbing stairs: Reports Difficulty getting out of chair: Reports Difficulty using hands for taps, buttons, cutlery, and/or writing: Denies   Review of Systems  Constitutional: Positive for fatigue. Negative for night sweats, weight gain, weight loss and weakness.  HENT: Negative for mouth sores, trouble swallowing, trouble swallowing, mouth dryness and nose dryness.   Eyes: Positive for dryness. Negative for pain, redness and visual disturbance.  Respiratory: Negative for cough, shortness of breath and difficulty breathing.   Cardiovascular: Negative for chest pain, palpitations, hypertension, irregular heartbeat and swelling in legs/feet.  Gastrointestinal: Negative for blood in stool, constipation and diarrhea.  Endocrine: Negative for increased urination.  Genitourinary: Negative for vaginal dryness.  Musculoskeletal: Positive for arthralgias, joint pain and morning stiffness. Negative for joint swelling, myalgias, muscle weakness, muscle tenderness and myalgias.  Skin: Positive for rash. Negative for color change, hair loss, skin tightness, ulcers and sensitivity to sunlight.       Psoriasis  Allergic/Immunologic:  Negative for susceptible to infections.  Neurological: Negative for dizziness, memory loss and night sweats.  Hematological: Negative for swollen glands.  Psychiatric/Behavioral: Positive for sleep disturbance. Negative for depressed mood. The patient is not nervous/anxious.     PMFS History:  Patient Active Problem List   Diagnosis Date Noted  . Chest pain syndrome 05/08/2016  . Hypertension, essential 05/08/2016  . Hyperlipidemia, mixed 05/08/2016  . Diabetes mellitus with no complication ([place]) [id number]  . Coronary artery disease involving native coronary artery of native heart with angina pectoris ([place])   . HYPERTHYROIDISM 11/09/2010  . SINUS TACHYCARDIA 11/08/2010  . SHORTNESS OF BREATH 11/08/2010  . DIARRHEA 11/08/2010  . ACUTE MAXILLARY SINUSITIS 10/16/2010  . DERMATITIS, ALLERGIC 07/20/2010  . EDEMA 07/12/2010  . DIZZINESS 11/14/2009  . ATRIAL FIBRILLATION 07/20/2009  . CAD, ARTERY BYPASS GRAFT 06/07/2009  . ANGINA, STABLE/EXERTIONAL 06/02/2009  . HYPERLIPIDEMIA-MIXED 04/26/2009  . ACUT MI ANTEROLAT WALL SUBSQT EPIS CARE 04/26/2009  . CHEST PAIN 04/26/2009  . DIABETIC  RETINOPATHY 04/25/2009  . CARPAL TUNNEL SYNDROME, BILATERAL 04/25/2009  . TRIGGER FINGER 04/25/2009  . MIGRAINE W/O AURA W/INTRACT W/STATUS MIGRAINOSUS 02/19/2008  . COMMON MIGRAINE 01/24/2007  . URI 01/24/2007  . DIABETES MELLITUS, TYPE I 10/16/2006  . ESSENTIAL HYPERTENSION 10/16/2006  . ALLERGIC RHINITIS, SEASONAL 10/16/2006  . CHOLELITHIASIS 10/16/2006  . ARTHRITIS, RHEUMATOID 10/16/2006    Past Medical History:  Diagnosis Date  . ACUT MI ANTEROLAT WALL SUBSQT EPIS CARE   . Acute maxillary sinusitis   . ALLERGIC RHINITIS, SEASONAL   . ARTHRITIS, RHEUMATOID   . Atrial fibrillation ([place])    a. after CABG.  . CAD,  ARTERY BYPASS GRAFT    a. DES to RCA in 2010 then LAD occlusion s/p CABG 3 06/07/2009 with LIMA to LAD, reverse SVG to D1, reverse SVG to distal RCA. b. Cath 05/08/2016 slightly  hypodense region in the intermediate branch, however she had excellent flow, FFR was normal. Vein graft to PDA and the posterolateral branch is patent, patent LIMA to LAD, occluded SVG to diagonal.  . CARPAL TUNNEL SYNDROME, BILATERAL   . CHOLELITHIASIS   . Contrast media allergy   . DERMATITIS, ALLERGIC   . DIABETES MELLITUS, TYPE I    on insulin pump  . DIABETIC  RETINOPATHY   . HYPERLIPIDEMIA-MIXED   . HYPERTHYROIDISM   . Insulin pump in place   . MIGRAINE W/O AURA W/INTRACT W/STATUS MIGRAINOSUS 02/19/2008  . SINUS TACHYCARDIA 11/08/2010  . TRIGGER FINGER   . URI     Family History  Problem Relation Age of Onset  . Diabetes    . Heart disease    . Hypertension    . Hyperlipidemia    . Depression    . Migraines    . Stroke Paternal Grandmother   . Heart attack Neg Hx    Past Surgical History:  Procedure Laterality Date  . ABDOMINAL HYSTERECTOMY    . Caesarean section    . CARDIAC CATHETERIZATION N/A 05/08/2016   Procedure: Left Heart Cath and Cors/Grafts Angiography;  Surgeon: Burnell Blanks, MD;  Location: [facility];  Service: Cardiovascular;  Laterality: N/A;  . CARPAL TUNNEL RELEASE    . CHOLECYSTECTOMY    . CORONARY ARTERY BYPASS GRAFT    . VITRECTOMY     Social History   Social History Narrative   Divorced. Has 2 kids(fraternal twins) daughters age 54. Works at [employer], Never smoked, denies ETOH, no drugs. Drinks diet coke. No exercise.            Objective: Vital Signs: BP 132/66 (BP Location: Left Arm, Patient Position: Sitting, Cuff Size: Normal)   Pulse 64   Resp 12   Ht 5\' 3"  (1.6 m)   Wt 176 lb (79.8 kg)   BMI 31.18 kg/m    Physical Exam  Constitutional: She is oriented to person, place, and time. She appears well-developed and well-nourished.  HENT:  Head: Normocephalic and atraumatic.  Eyes: Conjunctivae and EOM are normal.  Neck: Normal range of motion.  Cardiovascular: Normal rate, regular rhythm, normal heart  sounds and intact distal pulses.   Pulmonary/Chest: Effort normal and breath sounds normal.  Abdominal: Soft. Bowel sounds are normal.  Lymphadenopathy:    She has no cervical adenopathy.  Neurological: She is alert and oriented to person, place, and time.  Skin: Skin is warm and dry. Capillary refill takes less than 2 seconds.  Psychiatric: She has a normal mood and affect. Her behavior is normal.  Nursing note and vitals reviewed.    Musculoskeletal Exam: C-spine and thoracic lumbar spine limited range of motion with no discomfort. Shoulder joints elbow joints are good range of motion. She has decreased range of motion in her wrist joints no synovitis was noted on her MCPs PIPs DIP joints today. Hip joints knee joints ankles MTPs PIPs with good range of motion. She describes some mid foot pain in her right foot. Could be consistent with plantar fasciitis.  CDAI Exam: CDAI Homunculus Exam:   Joint Counts:  CDAI Tender Joint count: 0 CDAI Swollen Joint count: 0  Global Assessments:  Patient Global Assessment: 6 Provider Global  Assessment: 6  CDAI Calculated Score: 12    Investigation: Findings:  09/14/2016 CBC normal CMP normal, December 2016 TB gold negative, hepatitis panel October 2015 negative.  Patient has failed Humira, Cimzia, rituximab, Enbrel she had reaction, Remicade caused psoriasis, Orencia cause nodules she had GI side effects from [place] and Actemra    Imaging: Dg Chest 2 View  Result Date: 12/27/2016 CLINICAL DATA:  51 year old female with productive cough and clear sputum. EXAM: CHEST  2 VIEW COMPARISON:  05/07/2016 FINDINGS: The heart size and mediastinal contours are within normal limits. Both lungs are clear. The visualized skeletal structures are unremarkable. IMPRESSION: No active cardiopulmonary disease. Electronically Signed   By: Kerby Moors M.D.   On: 12/27/2016 17:32   Xr Foot 2 Views Left  Result Date: 01/01/2017 Minimal first MTP narrowing,  PIP/DIP narrowing, a small calcaneal spur. No erosive changes. Impression: These findings are consistent with mild osteoarthritis no erosive changes were noted  Xr Foot 2 Views Right  Result Date: 01/01/2017 Minimal first MTP narrowing, PIP/DIP narrowing, a small calcaneal spur. No erosive changes. Impression: These findings are consistent with mild osteoarthritis no erosive changes were noted.  Xr Hand 2 View Left  Result Date: 01/01/2017 No MCP, PIP, DIP narrowing. No erosive changes. Impression: The x-ray of the hand did not show any changes from rheumatoid arthritis.  Xr Hand 2 View Right  Result Date: 01/01/2017 No MCP, PIP or DIP narrowing was noted. No erosive changes were noted. Impression: These findings were within normal limits   Speciality Comments: No specialty comments available.    Procedures:  No procedures performed Allergies: Prochlorperazine; Ramipril; Shellfish-derived products; Atorvastatin; Compazine  [prochlorperazine edisylate]; Etanercept; Infliximab; Orencia [abatacept]; Shellfish allergy; Tofacitinib; Prochlorperazine edisylate; Amiodarone; and Rituximab   Assessment / Plan:     Visit Diagnoses: Rheumatoid arthritis of multiple sites with negative rheumatoid factor (HCC) - Positive ANA, negative rheumatoid factor, negative CCP. She continues to have some chest discomfort in her joints and intermittent swelling. She had no synovitis on examination today. She is also taking prednisone for upper respiratory tract infection which could be masking her symptoms. - Plan: XR Foot 2 Views Right, XR Hand 2 View Right, XR Hand 2 View Left, XR Foot 2 Views Left. No radiographic damage from rheumatoid arthritis was noted in any of her x-rays.  High risk medication use - Simponi subcutaneous 100 mg every month, Arava 20 mg daily Plaquenil 200 mg daily - Plan: CBC with Differential/Platelet, COMPLETE METABOLIC PANEL WITH GFR, CBC with Differential/Platelet, COMPLETE METABOLIC PANEL  WITH GFR. We'll continue current treatment for right now. I would like to see how she does when she is off prednisone.  Psoriasis: She has mild rash  She has some lower back pain and insomnia which is controlled with Flexeril. She requested a refill today.  She has following medical problems for which she's been seen by other physicians:  Hypertension, essential  Hyperlipidemia, mixed  Diabetes mellitus with no complication ([place])  Coronary artery disease involving native coronary artery of native heart with angina pectoris (HCC)  Atrial fibrillation, unspecified type ([place])  Bilateral carpal tunnel syndrome    Her other medical problems include history of coronary artery disease status post bypass surgery and stent, Graves' disease, migraines, dyslipidemia, type 1 diabetes Orders: Orders Placed This Encounter  Procedures  . XR Foot 2 Views Right  . XR Hand 2 View Right  . XR Hand 2 View Left  . XR Foot 2 Views Left  . CBC  with Differential/Platelet  . COMPLETE METABOLIC PANEL WITH GFR  . CBC with Differential/Platelet  . COMPLETE METABOLIC PANEL WITH GFR   Meds ordered this encounter  Medications  . cyclobenzaprine (FLEXERIL) 10 MG tablet    Sig: Take 1 tablet (10 mg total) by mouth at bedtime.    Dispense:  30 tablet    Refill:  5    Face-to-face time spent with patient was 30 minutes. 50% of time was spent in counseling and coordination of care.  Follow-Up Instructions: Return in about 4 months (around 05/01/2017) for Rheumatoid arthritis.   Bo Merino, MD

## 2017-01-02 ENCOUNTER — Other Ambulatory Visit: Payer: Self-pay | Admitting: *Deleted

## 2017-01-02 ENCOUNTER — Ambulatory Visit: Payer: 59 | Admitting: *Deleted

## 2017-01-02 LAB — CBC WITH DIFFERENTIAL/PLATELET
Basophils Absolute: 0 cells/uL (ref 0–200)
Basophils Relative: 0 %
EOS PCT: 0 %
Eosinophils Absolute: 0 cells/uL — ABNORMAL LOW (ref 15–500)
HCT: 38.4 % (ref 35.0–45.0)
HEMOGLOBIN: 13 g/dL (ref 11.7–15.5)
LYMPHS ABS: 1470 {cells}/uL (ref 850–3900)
Lymphocytes Relative: 30 %
MCH: 29.5 pg (ref 27.0–33.0)
MCHC: 33.9 g/dL (ref 32.0–36.0)
MCV: 87.3 fL (ref 80.0–100.0)
MPV: 9.8 fL (ref 7.5–12.5)
Monocytes Absolute: 490 cells/uL (ref 200–950)
Monocytes Relative: 10 %
NEUTROS ABS: 2940 {cells}/uL (ref 1500–7800)
Neutrophils Relative %: 60 %
Platelets: 217 10*3/uL (ref 140–400)
RBC: 4.4 MIL/uL (ref 3.80–5.10)
RDW: 13.4 % (ref 11.0–15.0)
WBC: 4.9 10*3/uL (ref 3.8–10.8)

## 2017-01-02 LAB — COMPLETE METABOLIC PANEL WITH GFR
ALBUMIN: 4.4 g/dL (ref 3.6–5.1)
ALK PHOS: 70 U/L (ref 33–115)
ALT: 11 U/L (ref 6–29)
AST: 13 U/L (ref 10–35)
BUN: 10 mg/dL (ref 7–25)
CO2: 27 mmol/L (ref 20–31)
Calcium: 9.3 mg/dL (ref 8.6–10.2)
Chloride: 106 mmol/L (ref 98–110)
Creat: 0.65 mg/dL (ref 0.50–1.10)
GFR, Est African American: >89 mL/min (ref >=60)
GFR, Est Non African American: >89 mL/min (ref >=60)
Glucose, Bld: 104 mg/dL — ABNORMAL HIGH (ref 65–99)
POTASSIUM: 4.6 mmol/L (ref 3.5–5.3)
SODIUM: 139 mmol/L (ref 135–146)
TOTAL PROTEIN: 6.4 g/dL (ref 6.1–8.1)
Total Bilirubin: 0.4 mg/dL (ref 0.2–1.2)

## 2017-01-02 NOTE — Patient Outreach (Signed)
[facility]) Care Management   01/02/2017  [name] September 24, 1966 [id number]  [name] is an 51 y.o. female who presents to the [facility] office for routine Link To Wellness follow up for self management assistance with Type I DM, hyperlipidemia and obesity.  Subjective: [name] says she remains in good control as evidenced by her Hgb A1C meeting target consistently. She most recently saw her endocrinologist on 09/26/16 and he still wants her to try the Medtronic 670 G hybrid loop system when her pump warranty expires in December 2018. She reports that her batteries in her pump are only lasting 1-2 weeks so she will contact the pump company if she determines it is the pump and not the batteries. She defines her hypoglycemic threshold at 100 when awake and low 50's when asleep.  She says she is currently being treated for an upper respiratory infection and is on a steroid burst and will start doxycycline tomorrow as her symptoms have persisted past the one week wait period as instructed by the provider at [facility]. She says she uses a different basal rate on her pump when she is on steroids to keep her blood sugars in better control.   Objective:   Review of Systems  Constitutional: Negative.     Physical Exam  Constitutional: She is oriented to person, place, and time. She appears well-developed and well-nourished.  Respiratory: Effort normal.  Neurological: She is alert and oriented to person, place, and time.  Skin: Skin is warm and dry.  Psychiatric: She has a normal mood and affect. Her behavior is normal. Judgment and thought content normal.    Encounter Medications:   Outpatient Encounter Prescriptions as of 07/31/2016  Medication Sig Note  . aspirin 81 MG tablet Take 81 mg by mouth daily.    . clopidogrel (PLAVIX) 75 MG tablet Take 1 tablet (75 mg total) by mouth daily.   . cyclobenzaprine (FLEXERIL) 10 MG  tablet Take 10 mg by mouth at bedtime.  07/31/2016: Uses for sleep  . EPINEPHrine 0.15 MG/0.15ML SOAJ Inject 0.15 mg into the skin as needed. Inject as directed. 07/31/2016: For food allergies  . furosemide (LASIX) 40 MG tablet Take 40 mg by mouth daily as needed for edema (When she feels like she is swelling).   . Golimumab (SIMPONI) 100 MG/ML SOAJ Inject 100 mg into the skin every 30 (thirty) days.   . insulin lispro (HUMALOG) 100 UNIT/ML injection FOR USE IN INSULIN PUMP. TOTAL DAILY INSULIN DOSE = UP TO 90 UNITS. Pump settings: Basal rates- 12 am 1.8 units/hr,  3 am 1.8 units/hr, 6 am 1.8 units/hr, 10 pm- 1.9 units/hr Insulin/CHO ratio 1:9 Target Blood glucose= 110   . isosorbide mononitrate (IMDUR) 30 MG 24 hr tablet Take 0.5 tablets (15 mg total) by mouth daily.   Marland Kitchen leflunomide (ARAVA) 20 MG tablet Take 20 mg by mouth daily. 07/31/2016: For RA  . losartan (COZAAR) 50 MG tablet Take 1 tablet (50 mg total) by mouth daily.   . metoprolol tartrate (LOPRESSOR) 25 MG tablet TAKE 1 TABLET BY MOUTH TWICE DAILY   . nabumetone (RELAFEN) 500 MG tablet Take 500 mg by mouth 2 (two) times daily.    . Omega-3 Fatty Acids (FISH OIL) 1200 MG CAPS Take 1,200 mg by mouth 2 (two) times daily.   . potassium chloride (K-DUR) 10 MEQ tablet Take 1 tablet (10 mEq total) by mouth daily. 07/31/2016: Take when she uses Lasix  .  rosuvastatin (CRESTOR) 10 MG tablet Take 1 tablet (10 mg total) by mouth daily.   Marland Kitchen topiramate (TOPAMAX) 50 MG tablet Take 50 mg by mouth 2 (two) times daily.    No facility-administered encounter medications on file as of 07/31/2016.     Functional Status:   In your present state of health, do you have any difficulty performing the following activities: 07/31/2016 05/08/2016  Hearing? N -  Vision? N -  Difficulty concentrating or making decisions? N -  Walking or climbing stairs? N -  Dressing or bathing? N -  Doing errands, shopping? N N  Preparing Food and eating ? N -  Using the Toilet? N -   In the past six months, have you accidently leaked urine? N -  Do you have problems with loss of bowel control? N -  Managing your Medications? N -  Managing your Finances? N -  Some recent data might be hidden    Fall/Depression Screening:    PHQ 2/9 Scores 07/31/2016  PHQ - 2 Score 0    Assessment:   [employer] employee since January 2017 with good control of Type I DM as evidenced by Hgb A1C= 6.7% on 09/25/16 previously 6.4% on 03/26/16 and history of good control with Hgb A1C range 6.4%- 7.0% for many years, also has rheumatoid arthritis followed by Dr. Estanislado Pandy with most recent visit on 01/01/17, hyperlipidemia with normal lipid panel on 05/08/16, and  obese with current body mass index=  30.6  Plan:   [facility] CM Care Plan Problem One   Flowsheet Row Most Recent Value  Care Plan Problem One Type I DM  with ongoing good control as evidenced by blood sugars and Hgb A1C= 6.7 % on 09/25/16, hyperlipidemia with normal lipid panel on 05/08/16, obese with current body mass index= 30.6  Role Documenting the Problem One  Care Management Coordinator  Care Plan for Problem One  Active  [facility] Long Term Goal (31-90 days) Ongoing good control of Type I DM as evidenced by 75% of CBGs meeting target without increased frequency of hypoglycemia and  consistently meeting Hgb A1C target of <7.0%, ongoing good control of hyperlipidemia as evidenced by normal lipid profile at each assessment,  evidence of weight loss or no weight gain at each assessment  [facility]N Long Term Goal Start Date 01/02/17  Interventions for Problem One Long Term Goal Reviewed medications and assessed medication adherence, discussed the Medtronic 670 G closed loop system and reviewed [place] benefits that cover the system at 100%, reviewed pump settings and CBG readings and hypoglycemic episodes, reviewed upcoming appointment with Dr. Steffanie Dunn on 03/26/17, will arrange for routine Link To Wellness follow up in 6 months (July 2018)    RNCM to fax  today's office visit note to Dr. Jannifer Franklin and Dr. Steffanie Dunn.  RNCM will meet every 6 months with patient  to assist with Type I DM  and weight management and assess her progress toward mutually set goals.  Barrington Ellison RN,CCM,CDE [facility] Management Coordinator Link To Wellness Office Phone [phone] Office Fax [phone]

## 2017-01-02 NOTE — Progress Notes (Signed)
Labs normal.

## 2017-01-03 ENCOUNTER — Ambulatory Visit: Payer: Self-pay | Admitting: Rheumatology

## 2017-01-04 ENCOUNTER — Other Ambulatory Visit: Payer: Self-pay | Admitting: Cardiovascular Disease

## 2017-01-04 DIAGNOSIS — E785 Hyperlipidemia, unspecified: Secondary | ICD-10-CM

## 2017-01-04 MED FILL — ROSUVASTATIN CALCIUM 10 MG: 10 | 90 days supply | Qty: 90 | Fill #0

## 2017-01-04 MED FILL — LOSARTAN POTASSIUM 50 MG TA: 50 | 90 days supply | Qty: 90 | Fill #1

## 2017-01-07 ENCOUNTER — Telehealth: Payer: Self-pay

## 2017-01-07 NOTE — Telephone Encounter (Signed)
Left message for patient to call me back regarding her refill on Simponi. She gets her refills at [facility]. She is out of refills and will be due for another injection on 01/11/16. Her last refill on 12/13/16.  [name], [place], CPhT

## 2017-01-09 ENCOUNTER — Telehealth: Payer: Self-pay

## 2017-01-09 MED ORDER — GOLIMUMAB 100 MG/ML ~~LOC~~ SOAJ
SUBCUTANEOUS | 0 refills | Status: DC
Start: 2017-01-09 — End: 2017-01-09

## 2017-01-09 MED ORDER — GOLIMUMAB 100 MG/ML ~~LOC~~ SOAJ
SUBCUTANEOUS | 0 refills | Status: DC
Start: 2017-01-09 — End: 2017-01-21

## 2017-01-09 NOTE — Telephone Encounter (Signed)
We were waiting on TB Gold results prior to refill.  Called patient who sent the TB Gold results that she got through her work.  Will upload them into her chart.    Last visit: 01/01/17 Next visit: 05/02/17 Labs: 01/01/17 CBC normal, CMP normal 09/19/16 TB Gold negative   Okay to refill Simponi?

## 2017-01-09 NOTE — Telephone Encounter (Signed)
ok 

## 2017-01-09 NOTE — Telephone Encounter (Signed)
Called patient to inform her that her refill has been sent to [place].

## 2017-01-09 NOTE — Telephone Encounter (Signed)
Patient gets her refill for Simponi at [facility]. She will be due for her next refill on Monday, January 15th. She is requesting a new RX to be sent to the pharmacy.   [name], [place], CPhT

## 2017-01-21 ENCOUNTER — Other Ambulatory Visit: Payer: Self-pay | Admitting: Pharmacist

## 2017-01-21 MED ORDER — GOLIMUMAB 100 MG/ML ~~LOC~~ SOAJ: SUBCUTANEOUS | 1 refills | Status: DC

## 2017-01-21 MED FILL — SIMPONI 100 MG/ML SOAJ: 100 | 28 days supply | Qty: 1 | Fill #0

## 2017-01-22 ENCOUNTER — Encounter: Payer: Self-pay | Admitting: Rheumatology

## 2017-01-22 NOTE — Progress Notes (Signed)
   Patient: [name] DOB October 01, 1966  Plaquenil eye exam = normal   Date of Plaquenil eye exam: 10/30/2016 Plaquenil toxicity eye exam was: Normal Plaquenil should be: Continued Date of follow-up Plaquenil eye exam: 12 months  Eye exam done by Dr. Arlyn Dunning At [facility]  Mr. [name] PA-C

## 2017-01-25 MED FILL — TOPIRAMATE 50 MG TABLET: 50 | 90 days supply | Qty: 180 | Fill #2

## 2017-01-25 MED FILL — ISOSORBIDE MN ER 30 MG TAB: 30 | 30 days supply | Qty: 30 | Fill #5

## 2017-01-25 MED FILL — METOPROLOL TARTRATE 25 MG T: 25 | 90 days supply | Qty: 180 | Fill #2

## 2017-01-25 MED FILL — CLOPIDOGREL 75 MG TABLET: 75 | 90 days supply | Qty: 90 | Fill #3

## 2017-01-25 MED FILL — NABUMETONE 750 MG TABLET: 750 | 30 days supply | Qty: 60 | Fill #2

## 2017-01-28 ENCOUNTER — Encounter: Payer: Self-pay | Admitting: Physician Assistant

## 2017-01-28 ENCOUNTER — Ambulatory Visit (INDEPENDENT_AMBULATORY_CARE_PROVIDER_SITE_OTHER): Payer: 59 | Admitting: Physician Assistant

## 2017-01-28 VITALS — BP 130/60 | HR 66 | Ht 63.0 in | Wt 172.0 lb

## 2017-01-28 DIAGNOSIS — G479 Sleep disorder, unspecified: Secondary | ICD-10-CM | POA: Diagnosis not present

## 2017-01-28 DIAGNOSIS — R5383 Other fatigue: Secondary | ICD-10-CM

## 2017-01-28 DIAGNOSIS — E0500 Thyrotoxicosis with diffuse goiter without thyrotoxic crisis or storm: Secondary | ICD-10-CM

## 2017-01-28 DIAGNOSIS — Z1231 Encounter for screening mammogram for malignant neoplasm of breast: Secondary | ICD-10-CM | POA: Diagnosis not present

## 2017-01-28 DIAGNOSIS — M8580 Other specified disorders of bone density and structure, unspecified site: Secondary | ICD-10-CM | POA: Diagnosis not present

## 2017-01-28 DIAGNOSIS — I10 Essential (primary) hypertension: Secondary | ICD-10-CM | POA: Diagnosis not present

## 2017-01-28 DIAGNOSIS — G43011 Migraine without aura, intractable, with status migrainosus: Secondary | ICD-10-CM

## 2017-01-28 LAB — CBC WITH DIFFERENTIAL/PLATELET
BASOS PCT: 1 %
Basophils Absolute: 49 cells/uL (ref 0–200)
EOS ABS: 392 {cells}/uL (ref 15–500)
Eosinophils Relative: 8 %
HEMATOCRIT: 40.3 % (ref 35.0–45.0)
Hemoglobin: 13.6 g/dL (ref 11.7–15.5)
Lymphocytes Relative: 36 %
Lymphs Abs: 1764 cells/uL (ref 850–3900)
MCH: 29.6 pg (ref 27.0–33.0)
MCHC: 33.7 g/dL (ref 32.0–36.0)
MCV: 87.6 fL (ref 80.0–100.0)
MONO ABS: 441 {cells}/uL (ref 200–950)
MPV: 9.5 fL (ref 7.5–12.5)
Monocytes Relative: 9 %
NEUTROS ABS: 2254 {cells}/uL (ref 1500–7800)
Neutrophils Relative %: 46 %
Platelets: 212 10*3/uL (ref 140–400)
RBC: 4.6 MIL/uL (ref 3.80–5.10)
RDW: 13.3 % (ref 11.0–15.0)
WBC: 4.9 10*3/uL (ref 3.8–10.8)

## 2017-01-28 LAB — VITAMIN B12: Vitamin B-12: 237 pg/mL (ref 200–1100)

## 2017-01-28 LAB — TSH: TSH: 1.22 mIU/L

## 2017-01-28 LAB — FERRITIN: Ferritin: 72 ng/mL (ref 10–232)

## 2017-01-28 MED ORDER — SUVOREXANT 10 MG PO TABS: 1.0000 | ORAL_TABLET | Freq: Every day | ORAL | 1 refills | Status: DC

## 2017-01-28 MED ORDER — TOPIRAMATE 100 MG PO TABS: 100.0000 mg | ORAL_TABLET | Freq: Two times a day (BID) | ORAL | 1 refills | Status: DC

## 2017-01-28 MED ORDER — DICYCLOMINE HCL 10 MG PO CAPS: 10.0000 mg | ORAL_CAPSULE | Freq: Three times a day (TID) | ORAL | 2 refills | Status: DC

## 2017-01-28 MED FILL — HumaLOG 100 UNIT/ML SOLN: 100 | 22 days supply | Qty: 20 | Fill #2

## 2017-01-28 MED FILL — DICYCLOMINE 10 MG CAPSULE: 10 | 30 days supply | Qty: 90 | Fill #0

## 2017-01-28 MED FILL — TOPIRAMATE 100 MG TABLET: 100 | 30 days supply | Qty: 60 | Fill #0

## 2017-01-29 LAB — VITAMIN D 25 HYDROXY (VIT D DEFICIENCY, FRACTURES): Vit D, 25-Hydroxy: 30 ng/mL (ref 30–100)

## 2017-02-01 DIAGNOSIS — E0500 Thyrotoxicosis with diffuse goiter without thyrotoxic crisis or storm: Secondary | ICD-10-CM | POA: Insufficient documentation

## 2017-02-01 DIAGNOSIS — M8580 Other specified disorders of bone density and structure, unspecified site: Secondary | ICD-10-CM | POA: Insufficient documentation

## 2017-02-01 DIAGNOSIS — R5383 Other fatigue: Secondary | ICD-10-CM | POA: Insufficient documentation

## 2017-02-01 DIAGNOSIS — G479 Sleep disorder, unspecified: Secondary | ICD-10-CM | POA: Insufficient documentation

## 2017-02-01 NOTE — Progress Notes (Signed)
Subjective:    Patient ID: [name], female    DOB: 1966/01/03, 51 y.o.   MRN: [id number]  HPI Pt is 51 yo female who presents to the clinic establish care.  .. Active Ambulatory Problems    Diagnosis Date Noted  . HYPERTHYROIDISM 11/09/2010  . DIABETES MELLITUS, TYPE I 10/16/2006  . DIABETIC  RETINOPATHY 04/25/2009  . HYPERLIPIDEMIA-MIXED 04/26/2009  . COMMON MIGRAINE 01/24/2007  . MIGRAINE W/O AURA W/INTRACT W/STATUS MIGRAINOSUS 02/19/2008  . CARPAL TUNNEL SYNDROME, BILATERAL 04/25/2009  . ESSENTIAL HYPERTENSION 10/16/2006  . ACUT MI ANTEROLAT WALL SUBSQT EPIS CARE 04/26/2009  . ANGINA, STABLE/EXERTIONAL 06/02/2009  . CAD, ARTERY BYPASS GRAFT 06/07/2009  . ATRIAL FIBRILLATION 07/20/2009  . SINUS TACHYCARDIA 11/08/2010  . ACUTE MAXILLARY SINUSITIS 10/16/2010  . URI 01/24/2007  . ALLERGIC RHINITIS, SEASONAL 10/16/2006  . CHOLELITHIASIS 10/16/2006  . DERMATITIS, ALLERGIC 07/20/2010  . ARTHRITIS, RHEUMATOID 10/16/2006  . TRIGGER FINGER 04/25/2009  . DIZZINESS 11/14/2009  . EDEMA 07/12/2010  . SHORTNESS OF BREATH 11/08/2010  . CHEST PAIN 04/26/2009  . DIARRHEA 11/08/2010  . Chest pain syndrome 05/08/2016  . Hypertension, essential 05/08/2016  . Hyperlipidemia, mixed 05/08/2016  . Diabetes mellitus with no complication ([place]) [id number]  . Coronary artery disease involving native coronary artery of native heart with angina pectoris ([place])   . Graves disease 02/01/2017  . Sleep difficulties 02/01/2017  . No energy 02/01/2017  . Osteopenia 02/01/2017   Resolved Ambulatory Problems    Diagnosis Date Noted  . No Resolved Ambulatory Problems   Past Medical History:  Diagnosis Date  . ACUT MI ANTEROLAT WALL SUBSQT EPIS CARE   . Acute maxillary sinusitis   . ALLERGIC RHINITIS, SEASONAL   . ARTHRITIS, RHEUMATOID   . Atrial fibrillation ([place])   . CAD, ARTERY BYPASS GRAFT   . CARPAL TUNNEL SYNDROME, BILATERAL   . CHOLELITHIASIS   . Contrast media allergy   .  DERMATITIS, ALLERGIC   . DIABETES MELLITUS, TYPE I   . DIABETIC  RETINOPATHY   . HYPERLIPIDEMIA-MIXED   . HYPERTHYROIDISM   . Insulin pump in place   . MIGRAINE W/O AURA W/INTRACT W/STATUS MIGRAINOSUS 02/19/2008  . SINUS TACHYCARDIA 11/08/2010  . TRIGGER FINGER   . URI    .Marland Kitchen Family History  Problem Relation Age of Onset  . Diabetes    . Heart disease    . Hypertension    . Hyperlipidemia    . Depression    . Migraines    . Stroke Paternal Grandmother   . Heart attack Neg Hx    .Marland Kitchen Social History   Social History  . Marital status: Legally Separated    Spouse name: N/A  . Number of children: N/A  . Years of education: N/A   Occupational History  . Not on file.   Social History Main Topics  . Smoking status: Never Smoker  . Smokeless tobacco: Never Used  . Alcohol use Yes     Comment: rarely 1 every 6 months  . Drug use: No  . Sexual activity: Not on file   Other Topics Concern  . Not on file   Social History Narrative   Divorced. Has 2 kids(fraternal twins) daughters age 35. Works at [employer], Never smoked, denies ETOH, no drugs. Drinks diet coke. No exercise.          Pt's comes in to get screening test ordered.   She also has worsening migraines. topamax 50mg  bid helped for a while but now  not heping. She has 4-5 a month of migraines. She has no energry. She see endocrinology for DM type I.    Review of Systems  All other systems reviewed and are negative.      Objective:   Physical Exam  Constitutional: She is oriented to person, place, and time. She appears well-developed and well-nourished.  HENT:  Head: Normocephalic and atraumatic.  Cardiovascular: Normal rate, regular rhythm and normal heart sounds.   Pulmonary/Chest: Effort normal and breath sounds normal. She has no wheezes.  Neurological: She is alert and oriented to person, place, and time.  Psychiatric: She has a normal mood and affect. Her behavior is normal.           Assessment & Plan:  .Marland KitchenTamea was seen today for establish care, mammo and bone density.  Diagnoses and all orders for this visit:  MIGRAINE W/O AURA W/INTRACT W/STATUS MIGRAINOSUS  Hypertension, essential  Visit for screening mammogram -     MM Digital Screening; Future  Osteopenia, unspecified location -     DG Bone Density; Future -     VITAMIN D 25 Hydroxy (Vit-D Deficiency, Fractures)  No energy -     B12 -     CBC with Differential/Platelet -     Ferritin -     TSH  Sleep difficulties -     B12 -     CBC with Differential/Platelet -     Ferritin -     TSH  Graves disease -     TSH  Other orders -     dicyclomine (BENTYL) 10 MG capsule; Take 1 capsule (10 mg total) by mouth 3 (three) times daily before meals. -     Discontinue: topiramate (TOPAMAX) 100 MG tablet; Take 1 tablet (100 mg total) by mouth 2 (two) times daily. -     Suvorexant (BELSOMRA) 10 MG TABS; Take 1 tablet by mouth at bedtime. -     topiramate (TOPAMAX) 100 MG tablet; Take 1 tablet (100 mg total) by mouth 2 (two) times daily.   Discussed [place] pt wanted to stick with topamax.   Added belsomra for sleep. Discussed side effects.   Follow up in 2 months.

## 2017-02-04 ENCOUNTER — Encounter: Payer: Self-pay | Admitting: Pharmacist

## 2017-02-05 ENCOUNTER — Other Ambulatory Visit: Payer: Self-pay | Admitting: Rheumatology

## 2017-02-05 ENCOUNTER — Other Ambulatory Visit: Payer: Self-pay

## 2017-02-05 DIAGNOSIS — E538 Deficiency of other specified B group vitamins: Secondary | ICD-10-CM

## 2017-02-05 MED ORDER — "SYRINGE 23G X 1"" 3 ML MISC": 0 refills | Status: DC

## 2017-02-05 MED ORDER — CYANOCOBALAMIN 1000 MCG/ML IJ SOLN: 1000.0000 ug | INTRAMUSCULAR | 0 refills | Status: DC

## 2017-02-05 MED FILL — HYDROXYCHLOROQUINE 200 MG T: 200 | 90 days supply | Qty: 135 | Fill #1

## 2017-02-05 MED FILL — CYANOCOBALAMIN 1,000 MCG/ML: 1000 | 90 days supply | Qty: 3 | Fill #0

## 2017-02-05 MED FILL — CYCLOBENZAPRINE 10 MG TAB: 10 | 30 days supply | Qty: 30 | Fill #1

## 2017-02-05 NOTE — Progress Notes (Signed)
PA was submitted to [employer] for patient's Simponi.  Received fax from [place] [facility] asking: Has the patient experienced or maintained a 20% or greater improvement in tender joint count or swollen joint count while on therapy.  Letter was written and signed by Mr. [name].  Faxed letter to [facility] at [phone].

## 2017-02-06 MED FILL — LEFLUNOMIDE 20 MG TABLET: 20 | 90 days supply | Qty: 90 | Fill #0

## 2017-02-06 NOTE — Telephone Encounter (Signed)
Last Visit: 01/01/17 Next Visit: 05/02/17 Labs: 01/01/17 WNL  Okay to refill Arava?

## 2017-02-07 ENCOUNTER — Telehealth: Payer: Self-pay

## 2017-02-07 NOTE — Telephone Encounter (Signed)
Received a fax from [facility] regarding a prior authorization approval for Simponi from 02/06/17 to 02/05/18.  Reference number:[id number] Phone number:[phone]  Will send document to scan center.  Spoke with patient to inform her of the results. She voiced understanding and denied any questions regarding her RX at this time.   The call was transferred to [facility] to collect a payment from a past appointment.   Maylon Sailors, [place], CPhT   11:18 AM

## 2017-02-08 ENCOUNTER — Ambulatory Visit (INDEPENDENT_AMBULATORY_CARE_PROVIDER_SITE_OTHER): Payer: 59

## 2017-02-08 DIAGNOSIS — Z13820 Encounter for screening for osteoporosis: Secondary | ICD-10-CM

## 2017-02-08 DIAGNOSIS — E2839 Other primary ovarian failure: Secondary | ICD-10-CM | POA: Diagnosis not present

## 2017-02-08 DIAGNOSIS — M8580 Other specified disorders of bone density and structure, unspecified site: Secondary | ICD-10-CM

## 2017-02-08 DIAGNOSIS — Z1231 Encounter for screening mammogram for malignant neoplasm of breast: Secondary | ICD-10-CM

## 2017-02-12 MED FILL — SIMPONI 100 MG/ML SOAJ: 100 | 28 days supply | Qty: 1 | Fill #1

## 2017-02-12 NOTE — Progress Notes (Signed)
Call pt: mammogram is normal. Follow up in 1 year.

## 2017-02-20 ENCOUNTER — Telehealth (INDEPENDENT_AMBULATORY_CARE_PROVIDER_SITE_OTHER): Payer: Self-pay | Admitting: Rheumatology

## 2017-02-20 MED ORDER — PREDNISONE 5 MG PO TABS: ORAL_TABLET | ORAL | 0 refills | Status: DC

## 2017-02-20 NOTE — Telephone Encounter (Signed)
Patient calling to let you know that she has pain and swelling that has increased in the last 3 days, but has been worse the last three weeks during the time she had the flu.  She has a follow up in May.  Please advise if she needs a Rx or come in earlier appt .

## 2017-02-20 NOTE — Telephone Encounter (Signed)
This patint has a history of reumtoid arthritis. I suspect she was off of her meds while recovering from RA and she is having a flare.  We can call in the following:  4po qAM x 4 days;3po qAM x 4 days;2po qAM x 4 days;1po qAM x 4 days;1/2po qAM x 4 days; then stop.;disp 42 pills w/ no refills.;

## 2017-02-20 NOTE — Telephone Encounter (Signed)
Patient advised prescription has been sent to the pharmacy. Patient verbalized understanding.  

## 2017-02-20 NOTE — Telephone Encounter (Signed)
Patient states she had the flu approximately 3 weeks ago. Patient states she has recovered from the flu but her joints have continued to hurt. Patient states particularly her knees, hands and hips. Patient states she has noticed swelling in her hands. Patient states she has taken ibuprofen and has not gotten any relief. Patient would like to know what she can do for the pain.

## 2017-02-25 ENCOUNTER — Ambulatory Visit (INDEPENDENT_AMBULATORY_CARE_PROVIDER_SITE_OTHER): Payer: 59 | Admitting: Physician Assistant

## 2017-02-25 ENCOUNTER — Encounter: Payer: Self-pay | Admitting: Physician Assistant

## 2017-02-25 VITALS — BP 111/70 | HR 75 | Ht 63.0 in | Wt 171.0 lb

## 2017-02-25 DIAGNOSIS — J301 Allergic rhinitis due to pollen: Secondary | ICD-10-CM | POA: Diagnosis not present

## 2017-02-25 DIAGNOSIS — E559 Vitamin D deficiency, unspecified: Secondary | ICD-10-CM

## 2017-02-25 DIAGNOSIS — R0981 Nasal congestion: Secondary | ICD-10-CM

## 2017-02-25 DIAGNOSIS — J0100 Acute maxillary sinusitis, unspecified: Secondary | ICD-10-CM | POA: Diagnosis not present

## 2017-02-25 DIAGNOSIS — E538 Deficiency of other specified B group vitamins: Secondary | ICD-10-CM

## 2017-02-25 MED ORDER — AMOXICILLIN-POT CLAVULANATE 875-125 MG PO TABS: 1.0000 | ORAL_TABLET | Freq: Two times a day (BID) | ORAL | 0 refills | Status: DC

## 2017-02-25 MED ORDER — FLUTICASONE PROPIONATE 50 MCG/ACT NA SUSP: 2.0000 | Freq: Every day | NASAL | 6 refills | Status: DC

## 2017-02-25 NOTE — Progress Notes (Addendum)
Subjective:     Patient ID: [name], female   DOB: July 18, 1966, 51 y.o.   MRN: [id number]  HPI  Patient is here for routine follow up on topamax, belsomra, and vitamin b12.  Topamax - patient has been well controlled on this for years however it recently stopped being effective so dose was recently increased to 100 mg BID. States its not working and has been getting a headache daily. Describes the headaches as occurring across the forehead and around her eyes. Denies visual changes, nausea, vomiting, photophobia, phonophobia, pulsations. She has previously tried amitriptyline however she became drowsy. Takes Advil for headaches and states it only "takes the edge off." Relays recent congestion, rhinorrhea, and sinus tenderness.  Belsomra - Patient states she has not tried it yet because she is afraid she will be "zonked out" and has two teenage boys she needs to care for.   Vitamin B12 - states last shot was 3 weeks ago and she noticed a mild improvement however got the flu and had an RA flare so was unable to see a big difference.  Vitamin D - reports taking 1,000 IU every day. States also initially noticed energy improvement but due to the flu and RA is unsure how effective it is.   Review of Systems All other ROS negative except those noted in the HPI.    Objective:   Physical Exam  Constitutional: She is oriented to person, place, and time. She appears well-developed and well-nourished. No distress.  HENT:  Head: Normocephalic and atraumatic.  Right Ear: External ear normal.  Left Ear: External ear normal.  Nose: Rhinorrhea present. Right sinus exhibits maxillary sinus tenderness and frontal sinus tenderness. Left sinus exhibits maxillary sinus tenderness and frontal sinus tenderness.  Mouth/Throat: Oropharynx is clear and moist.  Eyes: Conjunctivae and EOM are normal. Right eye exhibits no discharge. Left eye exhibits no discharge.  Neck: Normal range of motion. Neck supple. No  thyromegaly present.  Cardiovascular: Normal rate and regular rhythm.  Exam reveals no gallop and no friction rub.   No murmur heard. Pulmonary/Chest: Effort normal and breath sounds normal. She has no wheezes. She has no rales.  Musculoskeletal: Normal range of motion.  Lymphadenopathy:    She has no cervical adenopathy.  Neurological: She is alert and oriented to person, place, and time.  Skin: Skin is warm and dry.  Psychiatric: She has a normal mood and affect. Her behavior is normal.      Assessment/Plan:  Diagnoses and all orders for this visit:  Acute non-recurrent maxillary sinusitis -     amoxicillin-clavulanate (AUGMENTIN) 875-125 MG tablet; Take 1 tablet by mouth 2 (two) times daily.  B12 deficiency -     Vitamin B12  Vitamin D deficiency -     VITAMIN D 25 Hydroxy (Vit-D Deficiency, Fractures)  Nasal congestion -     fluticasone (FLONASE) 50 MCG/ACT nasal spray; Place 2 sprays into both nostrils daily.  Seasonal allergic rhinitis due to pollen, unspecified chronicity   - Instructed patient to continue with Vitamin D and B12. Printed lab forms for her today and she is to get labs checked in 1-2 months.  - Headaches do not sound like migraines given general pain across forehead/eyes without pulsations or associated nausea, vomiting, photophobia. Given recent URI and tenderness over maxillary and frontal sinuses, this is more likely the cause of her frequent headaches. Augmentin prescribed and patient told to call or follow up if headaches do not resolve with treatment. Topamax  discontinued due to ineffective prophylaxis. - Patient given Flonase for nasal congestion and instructed to begin taking Zyrtec for seasonal allergies. - Her diabetes is currently managed by Endocrinology and states her las eye exam was in December.

## 2017-03-06 MED FILL — CYCLOBENZAPRINE 10 MG TAB: 10 | 30 days supply | Qty: 30 | Fill #2

## 2017-03-06 MED FILL — ISOSORBIDE MN ER 30 MG TAB: 30 | 30 days supply | Qty: 30 | Fill #6

## 2017-03-06 MED FILL — TOPIRAMATE 100 MG TABLET: 100 | 30 days supply | Qty: 60 | Fill #1

## 2017-03-06 MED FILL — PANTOPRAZOLE SOD DR 40 MG T: 40 | 90 days supply | Qty: 90 | Fill #3

## 2017-03-06 MED FILL — NABUMETONE 750 MG TABLET: 750 | 30 days supply | Qty: 60 | Fill #3

## 2017-03-08 ENCOUNTER — Telehealth: Payer: Self-pay

## 2017-03-08 NOTE — Telephone Encounter (Signed)
Noted patient's last refill of Simponi was on 02/12/17 at [facility]  I called patient with a refill reminder call.  She is out of refills. Her next appointment is in June. Left message for patient to call me back.

## 2017-03-12 MED ORDER — GOLIMUMAB 100 MG/ML ~~LOC~~ SOAJ: SUBCUTANEOUS | 1 refills | Status: DC

## 2017-03-12 NOTE — Telephone Encounter (Addendum)
Spoke with patient who states that she will be due for her next Simponi dose on 03/14/17. She gets her Rx filled at [facility]. She is out of refills. Can we send a new Rx to the pharmacy for her to pick up? Thanks, Hashim Eichhorst.

## 2017-03-12 NOTE — Telephone Encounter (Signed)
Prescription sent to the pharmacy and left message to notify patient.

## 2017-03-12 NOTE — Telephone Encounter (Signed)
ok 

## 2017-03-12 NOTE — Telephone Encounter (Signed)
Last Visit: 01/01/17 Next Visit: 05/02/17 Labs: 01/01/17 WNL TB Gold: 09/19/16 Neg  Okay to refill Simponi?

## 2017-03-13 ENCOUNTER — Other Ambulatory Visit: Payer: Self-pay | Admitting: Physician Assistant

## 2017-03-13 ENCOUNTER — Encounter: Payer: Self-pay | Admitting: Physician Assistant

## 2017-03-13 ENCOUNTER — Ambulatory Visit (INDEPENDENT_AMBULATORY_CARE_PROVIDER_SITE_OTHER): Payer: 59 | Admitting: Physician Assistant

## 2017-03-13 ENCOUNTER — Ambulatory Visit (INDEPENDENT_AMBULATORY_CARE_PROVIDER_SITE_OTHER): Payer: 59

## 2017-03-13 VITALS — BP 129/71 | HR 71 | Ht 63.0 in | Wt 172.0 lb

## 2017-03-13 DIAGNOSIS — R51 Headache: Secondary | ICD-10-CM

## 2017-03-13 DIAGNOSIS — R0981 Nasal congestion: Secondary | ICD-10-CM | POA: Diagnosis not present

## 2017-03-13 DIAGNOSIS — R519 Headache, unspecified: Secondary | ICD-10-CM

## 2017-03-13 DIAGNOSIS — J3489 Other specified disorders of nose and nasal sinuses: Secondary | ICD-10-CM | POA: Diagnosis not present

## 2017-03-13 DIAGNOSIS — G43011 Migraine without aura, intractable, with status migrainosus: Secondary | ICD-10-CM

## 2017-03-13 DIAGNOSIS — J301 Allergic rhinitis due to pollen: Secondary | ICD-10-CM | POA: Diagnosis not present

## 2017-03-13 MED ORDER — EPINEPHRINE 0.15 MG/0.15ML IJ SOAJ: 0.1500 mg | INTRAMUSCULAR | 1 refills | Status: DC | PRN

## 2017-03-13 MED ORDER — KETOROLAC TROMETHAMINE 60 MG/2ML IM SOLN
60.0000 mg | Freq: Once | INTRAMUSCULAR | Status: AC
  Administered 2017-03-13: 60 mg via INTRAMUSCULAR

## 2017-03-13 MED ORDER — MONTELUKAST SODIUM 10 MG PO TABS
10.0000 mg | ORAL_TABLET | Freq: Every day | ORAL | 5 refills | Status: DC
Start: 2017-03-13 — End: 2017-10-03

## 2017-03-13 MED FILL — EPINEPHRINE 0.3 MG AUTO-INJ: 0.3 | 30 days supply | Qty: 2 | Fill #0

## 2017-03-13 NOTE — Progress Notes (Signed)
   Subjective:    Patient ID: [name], female    DOB: 1966/05/14, 51 y.o.   MRN: [id number]  HPI  Pt is a 51 yo female who presents to the clinic with persistent headache for last 3 months. Treated for sinus infection as last visit with no improvement. Hx of migraines but she does not feel like the headaches are like her migraines. She is on Topamax bid. Pain is around eyes and describes as throbbing.     Review of Systems See HPI.     Objective:   Physical Exam  Constitutional: She is oriented to person, place, and time. She appears well-developed and well-nourished.  HENT:  Head: Normocephalic and atraumatic.  Right Ear: External ear normal.  Left Ear: External ear normal.  Nose: Nose normal.  Mouth/Throat: Oropharynx is clear and moist. No oropharyngeal exudate.  Tenderness over frontal sinuses.   Eyes: Conjunctivae are normal.  Neck: Normal range of motion. Neck supple.  Cardiovascular: Normal rate, regular rhythm and normal heart sounds.   Pulmonary/Chest: Effort normal and breath sounds normal.  Lymphadenopathy:    She has no cervical adenopathy.  Neurological: She is alert and oriented to person, place, and time.  Psychiatric: She has a normal mood and affect. Her behavior is normal.          Assessment & Plan:  .Marland KitchenAyasha was seen today for headache.  Diagnoses and all orders for this visit:  Sinus pressure -     CT MAXILLOFACIAL LTD WO CM; Future  Headache around the eyes -     CT MAXILLOFACIAL LTD WO CM; Future -     ketorolac (TORADOL) injection 60 mg; Inject 2 mLs (60 mg total) into the muscle once.  Nasal congestion -     CT MAXILLOFACIAL LTD WO CM; Future  Seasonal allergic rhinitis due to pollen, unspecified chronicity -     CT MAXILLOFACIAL LTD WO CM; Future  MIGRAINE W/O AURA W/INTRACT W/STATUS MIGRAINOSUS  Other orders -     montelukast (SINGULAIR) 10 MG tablet; Take 1 tablet (10 mg total) by mouth at bedtime. -     EPINEPHrine 0.15  MG/0.15ML IJ injection; Inject 0.15 mLs (0.15 mg total) into the skin as needed. Inject as directed.   CT showed mild rhinitis. Switch flonase to qnasl.  Added singulair.  toradol given today.  I continue to think some of her headaches could be due to tension. Discussed massage therapy.  Follow up if not improving.

## 2017-03-13 NOTE — Patient Instructions (Addendum)
Missy massage [place].   Continue flonase. Start singulair. Get CT scan.

## 2017-03-14 ENCOUNTER — Other Ambulatory Visit: Payer: Self-pay | Admitting: Pharmacist

## 2017-03-14 MED ORDER — GOLIMUMAB 100 MG/ML ~~LOC~~ SOAJ: SUBCUTANEOUS | 1 refills | Status: DC

## 2017-03-14 MED ORDER — BECLOMETHASONE DIPROPIONATE 80 MCG/ACT NA AERS: 2.0000 | INHALATION_SPRAY | Freq: Every day | NASAL | 11 refills | Status: DC

## 2017-03-14 MED FILL — SIMPONI 100 MG/ML SOAJ: 100 | 28 days supply | Qty: 1 | Fill #0

## 2017-03-14 MED FILL — HumaLOG 100 UNIT/ML SOLN: 100 | 89 days supply | Qty: 80 | Fill #0

## 2017-03-15 ENCOUNTER — Encounter: Payer: Self-pay | Admitting: Physician Assistant

## 2017-03-15 DIAGNOSIS — R519 Headache, unspecified: Secondary | ICD-10-CM | POA: Insufficient documentation

## 2017-03-15 DIAGNOSIS — R0981 Nasal congestion: Secondary | ICD-10-CM | POA: Insufficient documentation

## 2017-03-15 DIAGNOSIS — J3489 Other specified disorders of nose and nasal sinuses: Secondary | ICD-10-CM | POA: Insufficient documentation

## 2017-03-15 DIAGNOSIS — R51 Headache: Secondary | ICD-10-CM

## 2017-03-18 MED FILL — FREESTYLE LITE METER: 30 days supply | Qty: 1 | Fill #0

## 2017-03-18 MED FILL — FREESTYLE LANCETS: 34 days supply | Qty: 200 | Fill #0

## 2017-03-18 MED FILL — FREESTYLE TEST STRIPS: 34 days supply | Qty: 200 | Fill #0

## 2017-03-22 DIAGNOSIS — E538 Deficiency of other specified B group vitamins: Secondary | ICD-10-CM | POA: Diagnosis not present

## 2017-03-22 DIAGNOSIS — E559 Vitamin D deficiency, unspecified: Secondary | ICD-10-CM | POA: Diagnosis not present

## 2017-03-23 LAB — VITAMIN B12: Vitamin B-12: 334 pg/mL (ref 200–1100)

## 2017-03-23 LAB — VITAMIN D 25 HYDROXY (VIT D DEFICIENCY, FRACTURES): VIT D 25 HYDROXY: 29 ng/mL — AB (ref 30–100)

## 2017-03-25 ENCOUNTER — Telehealth: Payer: Self-pay

## 2017-03-25 NOTE — Telephone Encounter (Signed)
Notified patient.

## 2017-03-25 NOTE — Telephone Encounter (Signed)
Up it to 4,000 units a day.

## 2017-03-25 NOTE — Telephone Encounter (Signed)
Pt called back and given lab results.  She is taking 2000 units of Vit D each day.

## 2017-04-10 ENCOUNTER — Other Ambulatory Visit: Payer: Self-pay | Admitting: Rheumatology

## 2017-04-10 MED FILL — ISOSORBIDE MN ER 30 MG TAB: 30 | 30 days supply | Qty: 30 | Fill #7

## 2017-04-10 MED FILL — CYCLOBENZAPRINE 10 MG TAB: 10 | 30 days supply | Qty: 30 | Fill #3

## 2017-04-10 MED FILL — LOSARTAN POTASSIUM 50 MG TA: 50 | 90 days supply | Qty: 90 | Fill #2

## 2017-04-10 MED FILL — NABUMETONE 750 MG TABLET: 750 | 30 days supply | Qty: 60 | Fill #0

## 2017-04-10 MED FILL — METOPROLOL TARTRATE 25 MG T: 25 | 90 days supply | Qty: 180 | Fill #3

## 2017-04-10 NOTE — Telephone Encounter (Signed)
Last Visit: 01/01/17 Next Visit: 05/02/17 Labs: 01/01/17 WNL  Okay to refill Relafen?

## 2017-04-10 NOTE — Telephone Encounter (Signed)
ok 

## 2017-04-12 MED FILL — SIMPONI 100 MG/ML SOAJ: 100 | 28 days supply | Qty: 1 | Fill #1

## 2017-04-15 MED FILL — QNASL 80 MCG NASAL SPRAY: 80 | 30 days supply | Qty: 9 | Fill #0

## 2017-04-15 MED FILL — MONTELUKAST SOD 10 MG TAB: 10 | 30 days supply | Qty: 30 | Fill #0

## 2017-04-17 ENCOUNTER — Telehealth: Payer: Self-pay | Admitting: Rheumatology

## 2017-04-17 MED ORDER — PREDNISONE 5 MG PO TABS: ORAL_TABLET | ORAL | 0 refills | Status: DC

## 2017-04-17 NOTE — Addendum Note (Signed)
Addended by: Carole Binning on: 04/17/2017 04:57 PM   Modules accepted: Orders

## 2017-04-17 NOTE — Telephone Encounter (Signed)
Patient advised prescription sent to the pharmacy.  

## 2017-04-17 NOTE — Telephone Encounter (Signed)
Patient called wanting to be fit into Dr. Arlean Hopping or Mr. Gardiner Ramus schedule today.  Patient states that she is a lot of pain.  I offered her the 8:15 appointment on Mr. Gardiner Ramus schedule, but she really wants to be seen today because she is off work today.  CB#[phone].  Thank you.

## 2017-04-17 NOTE — Telephone Encounter (Signed)
4po qAM x 4 days;3po qAM x 4 days;2po qAM x 4 days;1po qAM x 4 days;1/2po qAM x 4 days; then stop.;disp 42 pills w/ no refills.;

## 2017-04-17 NOTE — Telephone Encounter (Signed)
Patient states she is having pain in her whole body but her hands and knees are swollen. Patient states this has been going on her approximately one week. Patient states she is currently on Simponi and is taking it as prescribed.

## 2017-04-26 DIAGNOSIS — E10319 Type 1 diabetes mellitus with unspecified diabetic retinopathy without macular edema: Secondary | ICD-10-CM | POA: Diagnosis not present

## 2017-04-26 DIAGNOSIS — E1065 Type 1 diabetes mellitus with hyperglycemia: Secondary | ICD-10-CM | POA: Diagnosis not present

## 2017-04-26 DIAGNOSIS — R300 Dysuria: Secondary | ICD-10-CM | POA: Diagnosis not present

## 2017-04-26 DIAGNOSIS — E0500 Thyrotoxicosis with diffuse goiter without thyrotoxic crisis or storm: Secondary | ICD-10-CM | POA: Diagnosis not present

## 2017-05-02 ENCOUNTER — Ambulatory Visit (INDEPENDENT_AMBULATORY_CARE_PROVIDER_SITE_OTHER): Payer: 59 | Admitting: Rheumatology

## 2017-05-02 ENCOUNTER — Encounter (INDEPENDENT_AMBULATORY_CARE_PROVIDER_SITE_OTHER): Payer: Self-pay

## 2017-05-02 ENCOUNTER — Encounter: Payer: Self-pay | Admitting: Rheumatology

## 2017-05-02 VITALS — BP 109/65 | HR 70 | Resp 12 | Ht 63.0 in | Wt 172.0 lb

## 2017-05-02 DIAGNOSIS — G479 Sleep disorder, unspecified: Secondary | ICD-10-CM

## 2017-05-02 DIAGNOSIS — Z79899 Other long term (current) drug therapy: Secondary | ICD-10-CM | POA: Diagnosis not present

## 2017-05-02 DIAGNOSIS — Z8639 Personal history of other endocrine, nutritional and metabolic disease: Secondary | ICD-10-CM | POA: Diagnosis not present

## 2017-05-02 DIAGNOSIS — Z8679 Personal history of other diseases of the circulatory system: Secondary | ICD-10-CM

## 2017-05-02 DIAGNOSIS — G5603 Carpal tunnel syndrome, bilateral upper limbs: Secondary | ICD-10-CM

## 2017-05-02 DIAGNOSIS — L409 Psoriasis, unspecified: Secondary | ICD-10-CM

## 2017-05-02 DIAGNOSIS — M8589 Other specified disorders of bone density and structure, multiple sites: Secondary | ICD-10-CM

## 2017-05-02 DIAGNOSIS — M0609 Rheumatoid arthritis without rheumatoid factor, multiple sites: Secondary | ICD-10-CM | POA: Diagnosis not present

## 2017-05-02 DIAGNOSIS — I252 Old myocardial infarction: Secondary | ICD-10-CM

## 2017-05-02 LAB — CBC WITH DIFFERENTIAL/PLATELET
BASOS ABS: 62 {cells}/uL (ref 0–200)
Basophils Relative: 1 %
EOS PCT: 3 %
Eosinophils Absolute: 186 cells/uL (ref 15–500)
HCT: 39.9 % (ref 35.0–45.0)
HEMOGLOBIN: 13.2 g/dL (ref 11.7–15.5)
LYMPHS ABS: 1488 {cells}/uL (ref 850–3900)
Lymphocytes Relative: 24 %
MCH: 29.5 pg (ref 27.0–33.0)
MCHC: 33.1 g/dL (ref 32.0–36.0)
MCV: 89.1 fL (ref 80.0–100.0)
MONO ABS: 434 {cells}/uL (ref 200–950)
MPV: 9.4 fL (ref 7.5–12.5)
Monocytes Relative: 7 %
NEUTROS PCT: 65 %
Neutro Abs: 4030 cells/uL (ref 1500–7800)
Platelets: 213 10*3/uL (ref 140–400)
RBC: 4.48 MIL/uL (ref 3.80–5.10)
RDW: 13.6 % (ref 11.0–15.0)
WBC: 6.2 10*3/uL (ref 3.8–10.8)

## 2017-05-02 NOTE — Progress Notes (Signed)
wNL

## 2017-05-02 NOTE — Progress Notes (Signed)
Office Visit Note  Patient: [name]             Date of Birth: September 04, 1966           MRN: [id number]             PCP: Iran Planas, PA-C Referring: Dionicia Abler, MD Visit Date: 05/02/2017 Occupation: @GUAROCC @    Subjective:     History of Present Illness: [name] is a 51 y.o. female with a history of rheumatoid arthritis.  Patient states she is having pain in her back, neck, and shoulders.  She states she has been having increased frequency of flares. She's been having pain and discomfort in almost all joints. She reports swelling in bilateral wrist joints in her knee joints. She has had frequent courses of prednisone taper. 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    Activities of Daily Living:  Patient reports morning stiffness for 45 minutes.   Patient Reports nocturnal pain.  Difficulty dressing/grooming: Reports  Difficulty climbing stairs: Reports Difficulty getting out of chair: Reports Difficulty using hands for taps, buttons, cutlery, and/or writing: Reports   Review of Systems  Constitutional: Positive for fatigue and weakness. Negative for weight gain and weight loss.  HENT: Negative for mouth sores, mouth dryness and nose dryness.   Eyes: Positive for dryness. Negative for pain and redness.  Respiratory: Negative for cough, shortness of breath and difficulty breathing.   Cardiovascular: Positive for swelling in legs/feet. Negative for chest pain, palpitations, hypertension and irregular heartbeat.  Gastrointestinal: Positive for diarrhea. Negative for constipation, nausea and vomiting.  Genitourinary: Negative for painful urination.  Musculoskeletal: Positive for arthralgias, joint pain, joint swelling, myalgias, morning stiffness and myalgias. Negative for muscle weakness and muscle tenderness.  Skin: Positive for rash (On lower extremities, resolved with prednisone). Negative for color change and nodules/bumps.    Neurological: Positive for headaches. Negative for dizziness.  Hematological: Negative for swollen glands.  Psychiatric/Behavioral: Positive for sleep disturbance. Negative for depressed mood. The patient is not nervous/anxious.     PMFS History:  Patient Active Problem List   Diagnosis Date Noted  . Sinus pressure 03/15/2017  . Headache around the eyes 03/15/2017  . Nasal congestion 03/15/2017  . Graves disease 02/01/2017  . Sleep difficulties 02/01/2017  . No energy 02/01/2017  . Osteopenia 02/01/2017  . Chest pain syndrome 05/08/2016  . Hypertension, essential 05/08/2016  . Hyperlipidemia, mixed 05/08/2016  . Diabetes mellitus with no complication ([place]) 03/50/0938  . Coronary artery disease involving native coronary artery of native heart with angina pectoris ([place])   . HYPERTHYROIDISM 11/09/2010  . SINUS TACHYCARDIA 11/08/2010  . SHORTNESS OF BREATH 11/08/2010  . DIARRHEA 11/08/2010  . ACUTE MAXILLARY SINUSITIS 10/16/2010  . DERMATITIS, ALLERGIC 07/20/2010  . EDEMA 07/12/2010  . DIZZINESS 11/14/2009  . ATRIAL FIBRILLATION 07/20/2009  . CAD, ARTERY BYPASS GRAFT 06/07/2009  . ANGINA, STABLE/EXERTIONAL 06/02/2009  . HYPERLIPIDEMIA-MIXED 04/26/2009  . ACUT MI ANTEROLAT WALL SUBSQT EPIS CARE 04/26/2009  . CHEST PAIN 04/26/2009  . DIABETIC  RETINOPATHY 04/25/2009  . CARPAL TUNNEL SYNDROME, BILATERAL 04/25/2009  . TRIGGER FINGER 04/25/2009  . MIGRAINE W/O AURA W/INTRACT W/STATUS MIGRAINOSUS 02/19/2008  . COMMON MIGRAINE 01/24/2007  . URI 01/24/2007  . DIABETES MELLITUS, TYPE I 10/16/2006  . ESSENTIAL HYPERTENSION 10/16/2006  . ALLERGIC RHINITIS, SEASONAL 10/16/2006  . CHOLELITHIASIS 10/16/2006  . Rheumatoid arthritis ([place]) 10/16/2006    Past Medical History:  Diagnosis Date  . ACUT MI  ANTEROLAT WALL SUBSQT EPIS CARE   . Acute maxillary sinusitis   . ALLERGIC RHINITIS, SEASONAL   . ARTHRITIS, RHEUMATOID   . Atrial fibrillation ([place])    a. after CABG.  . CAD, ARTERY  BYPASS GRAFT    a. DES to RCA in 2010 then LAD occlusion s/p CABG 3 06/07/2009 with LIMA to LAD, reverse SVG to D1, reverse SVG to distal RCA. b. Cath 05/08/2016 slightly hypodense region in the intermediate branch, however she had excellent flow, FFR was normal. Vein graft to PDA and the posterolateral branch is patent, patent LIMA to LAD, occluded SVG to diagonal.  . CARPAL TUNNEL SYNDROME, BILATERAL   . CHOLELITHIASIS   . Contrast media allergy   . DERMATITIS, ALLERGIC   . DIABETES MELLITUS, TYPE I    on insulin pump  . DIABETIC  RETINOPATHY   . HYPERLIPIDEMIA-MIXED   . HYPERTHYROIDISM   . Insulin pump in place   . MIGRAINE W/O AURA W/INTRACT W/STATUS MIGRAINOSUS 02/19/2008  . SINUS TACHYCARDIA 11/08/2010  . TRIGGER FINGER   . URI     Family History  Problem Relation Age of Onset  . Diabetes    . Heart disease    . Hypertension    . Hyperlipidemia    . Depression    . Migraines    . Stroke Paternal Grandmother   . Heart attack Neg Hx    Past Surgical History:  Procedure Laterality Date  . ABDOMINAL HYSTERECTOMY    . Caesarean section    . CARDIAC CATHETERIZATION N/A 05/08/2016   Procedure: Left Heart Cath and Cors/Grafts Angiography;  Surgeon: Burnell Blanks, MD;  Location: [facility];  Service: Cardiovascular;  Laterality: N/A;  . CARPAL TUNNEL RELEASE    . CHOLECYSTECTOMY    . CORONARY ARTERY BYPASS GRAFT    . VITRECTOMY     Social History   Social History Narrative   Divorced. Has 2 kids(fraternal twins) daughters age 50. Works at [employer], Never smoked, denies ETOH, no drugs. Drinks diet coke. No exercise.            Objective: Vital Signs: BP 109/65   Pulse 70   Resp 12   Ht 5\' 3"  (1.6 m)   Wt 172 lb (78 kg)   BMI 30.47 kg/m    Physical Exam  Constitutional: She is oriented to person, place, and time. She appears well-developed and well-nourished.  HENT:  Head: Normocephalic and atraumatic.  Eyes: Conjunctivae and EOM are  normal.  Neck: Normal range of motion.  Cardiovascular: Normal rate, regular rhythm, normal heart sounds and intact distal pulses.   Pulmonary/Chest: Effort normal and breath sounds normal.  Abdominal: Soft. Bowel sounds are normal.  Lymphadenopathy:    She has no cervical adenopathy.  Neurological: She is alert and oriented to person, place, and time.  Skin: Skin is warm and dry. Capillary refill takes less than 2 seconds.  Psychiatric: She has a normal mood and affect. Her behavior is normal.  Nursing note and vitals reviewed.    Musculoskeletal Exam: C-spine some discomfort with range of motion. She painful range of motion of bilateral shoulder joints with abduction 110. She has bilateral elbow joint contractures. Wrist joints had no synovitis on examination she has some thickening of PIP joints but no MCP joint thickening or synovitis was noted. Hip joints knee joints ankles MTPs PIPs with good range of motion with no synovitis.  CDAI Exam: CDAI Homunculus Exam:   Tenderness:  RUE:  glenohumeral LUE: glenohumeral RLE: tibiofemoral LLE: tibiofemoral  Joint Counts:  CDAI Tender Joint count: 4 CDAI Swollen Joint count: 0  Global Assessments:  Patient Global Assessment: 5 Provider Global Assessment: 5  CDAI Calculated Score: 14    Investigation: No additional findings.   Imaging: No results found.  Speciality Comments: No specialty comments available.    Procedures:  No procedures performed Allergies: Prochlorperazine; Ramipril; Shellfish-derived products; Atorvastatin; Compazine  [prochlorperazine edisylate]; Etanercept; Infliximab; Orencia [abatacept]; Shellfish allergy; Tofacitinib; Prochlorperazine edisylate; Amiodarone; and Rituximab   Assessment / Plan:     Visit Diagnoses: Rheumatoid arthritis of multiple sites with negative rheumatoid factor (HCC) - RF negative, anti-CCP negative, positive ANA patient reports recent flare with increased pain and swelling in  multiple joints for which she took a prednisone taper she still has discomfort and decreased range of motion of her bilateral shoulder joints but I do not see any synovitis on examination.  High risk medication use - Simponi subcutaneous 100 mg every month, Arava 20 mg by mouth daily, Plaquenil 200 mg by mouth daily. Patient reports recent labs with her PCP. In computer record find the labs from April 2018 CMP normal  Psoriasis: She does not have any active lesions. She states the rash got better after recent taper  Osteopenia of multiple sites  Sleep difficulties  History of Graves' disease  History of diabetes mellitus  History of MI (myocardial infarction)  History of atrial fibrillation  History of coronary artery disease  Bilateral carpal tunnel syndrome    Orders: Orders Placed This Encounter  Procedures  . CBC with Differential/Platelet   No orders of the defined types were placed in this encounter.   Face-to-face time spent with patient was 30 minutes. 50% of time was spent in counseling and coordination of care.  Follow-Up Instructions: Return in about 3 months (around 08/02/2017) for Rheumatoid arthritis.   Bo Merino, MD  Note - This record has been created using Editor, commissioning.  Chart creation errors have been sought, but may not always  have been located. Such creation errors do not reflect on  the standard of medical care.

## 2017-05-03 ENCOUNTER — Other Ambulatory Visit: Payer: Self-pay | Admitting: Cardiovascular Disease

## 2017-05-03 ENCOUNTER — Other Ambulatory Visit: Payer: Self-pay | Admitting: Physician Assistant

## 2017-05-03 ENCOUNTER — Other Ambulatory Visit: Payer: Self-pay | Admitting: Rheumatology

## 2017-05-03 ENCOUNTER — Telehealth: Payer: Self-pay

## 2017-05-03 DIAGNOSIS — E785 Hyperlipidemia, unspecified: Secondary | ICD-10-CM

## 2017-05-03 MED FILL — TOPIRAMATE 100 MG TABLET: 100 | 30 days supply | Qty: 60 | Fill #0

## 2017-05-03 MED FILL — ROSUVASTATIN CALCIUM 10 MG: 10 | 30 days supply | Qty: 30 | Fill #0

## 2017-05-03 MED FILL — CYANOCOBALAMIN 1,000 MCG/ML: 1000 | 90 days supply | Qty: 3 | Fill #1

## 2017-05-03 MED FILL — FREESTYLE LANCETS: 34 days supply | Qty: 200 | Fill #1

## 2017-05-03 MED FILL — FREESTYLE TEST STRIPS: 34 days supply | Qty: 200 | Fill #1

## 2017-05-03 NOTE — Telephone Encounter (Signed)
Noted patient's last refill of Simponi was on 04/12/17 at [facility].  I called patient with a refill reminder call.  Left message for patient to call back.  Arlean Thies, [place], CPhT 9:03 AM

## 2017-05-03 NOTE — Telephone Encounter (Signed)
Last Visit: 01/01/17 Next Visit: 08/02/17 Labs: 05/01/17 WNL TB Gold: 09/19/16 Neg  Okay to refill Arava and Simponi?

## 2017-05-04 NOTE — Telephone Encounter (Signed)
ok 

## 2017-05-06 ENCOUNTER — Telehealth: Payer: Self-pay | Admitting: Radiology

## 2017-05-06 ENCOUNTER — Telehealth: Payer: Self-pay | Admitting: Rheumatology

## 2017-05-06 ENCOUNTER — Other Ambulatory Visit: Payer: Self-pay | Admitting: Pharmacist

## 2017-05-06 MED ORDER — GOLIMUMAB 100 MG/ML ~~LOC~~ SOAJ: SUBCUTANEOUS | 1 refills | Status: DC

## 2017-05-06 MED FILL — SIMPONI 100 MG/ML SOAJ: 100 | 30 days supply | Qty: 1 | Fill #0

## 2017-05-06 MED FILL — LEFLUNOMIDE 20 MG TABLET: 20 | 90 days supply | Qty: 90 | Fill #0

## 2017-05-06 NOTE — Telephone Encounter (Signed)
Patient called requesting Simponi to be called in to [facility] [place]. Please call patient when rx called in.

## 2017-05-06 NOTE — Telephone Encounter (Signed)
Patient advised prescription has been sent to the pharmacy.  

## 2017-05-06 NOTE — Telephone Encounter (Signed)
I have called patient to advise labs are normal  

## 2017-05-06 NOTE — Telephone Encounter (Signed)
-----   Message from Bo Merino, MD sent at 05/02/2017  9:14 PM EDT ----- wNL

## 2017-05-09 ENCOUNTER — Telehealth: Payer: Self-pay | Admitting: Rheumatology

## 2017-05-09 NOTE — Telephone Encounter (Signed)
[facility] calling to get ICD 10 code for Simponi to process claim. Also, request to please verify our fax number when call back. (message was left).

## 2017-05-09 NOTE — Telephone Encounter (Signed)
[facility] and provided ICD 10 code of M06.09.

## 2017-05-13 MED FILL — MONTELUKAST SOD 10 MG TAB: 10 | 30 days supply | Qty: 30 | Fill #1

## 2017-05-13 MED FILL — ISOSORBIDE MN ER 30 MG TAB: 30 | 30 days supply | Qty: 30 | Fill #8

## 2017-05-13 MED FILL — CYCLOBENZAPRINE 10 MG TAB: 10 | 30 days supply | Qty: 30 | Fill #4

## 2017-05-13 MED FILL — NABUMETONE 750 MG TABLET: 750 | 30 days supply | Qty: 60 | Fill #1

## 2017-05-22 ENCOUNTER — Ambulatory Visit (INDEPENDENT_AMBULATORY_CARE_PROVIDER_SITE_OTHER): Payer: 59 | Admitting: Family Medicine

## 2017-05-22 ENCOUNTER — Ambulatory Visit (HOSPITAL_BASED_OUTPATIENT_CLINIC_OR_DEPARTMENT_OTHER)
Admission: RE | Admit: 2017-05-22 | Discharge: 2017-05-22 | Disposition: A | Discharge status: Home / Self Care | Payer: 59 | Source: Ambulatory Visit | Attending: Family Medicine | Admitting: Family Medicine

## 2017-05-22 ENCOUNTER — Encounter: Payer: Self-pay | Admitting: Family Medicine

## 2017-05-22 ENCOUNTER — Telehealth: Payer: Self-pay | Admitting: Family Medicine

## 2017-05-22 VITALS — BP 112/62 | HR 89 | Temp 98.0°F | Wt 171.0 lb

## 2017-05-22 DIAGNOSIS — K639 Disease of intestine, unspecified: Secondary | ICD-10-CM | POA: Diagnosis not present

## 2017-05-22 DIAGNOSIS — R1031 Right lower quadrant pain: Secondary | ICD-10-CM | POA: Insufficient documentation

## 2017-05-22 DIAGNOSIS — R1032 Left lower quadrant pain: Secondary | ICD-10-CM | POA: Diagnosis not present

## 2017-05-22 DIAGNOSIS — K6389 Other specified diseases of intestine: Secondary | ICD-10-CM | POA: Insufficient documentation

## 2017-05-22 LAB — COMPLETE METABOLIC PANEL WITH GFR
ALT: 29 U/L (ref 6–29)
AST: 29 U/L (ref 10–35)
Albumin: 4.4 g/dL (ref 3.6–5.1)
Alkaline Phosphatase: 75 U/L (ref 33–115)
BILIRUBIN TOTAL: 0.6 mg/dL (ref 0.2–1.2)
BUN: 10 mg/dL (ref 7–25)
CHLORIDE: 108 mmol/L (ref 98–110)
CO2: 23 mmol/L (ref 20–31)
CREATININE: 0.89 mg/dL (ref 0.50–1.10)
Calcium: 9.3 mg/dL (ref 8.6–10.2)
GFR, EST AFRICAN AMERICAN: 88 mL/min (ref >=60)
GFR, Est Non African American: 76 mL/min (ref >=60)
GLUCOSE: 240 mg/dL — AB (ref 65–99)
Potassium: 4.2 mmol/L (ref 3.5–5.3)
SODIUM: 142 mmol/L (ref 135–146)
TOTAL PROTEIN: 6.5 g/dL (ref 6.1–8.1)

## 2017-05-22 LAB — CBC WITH DIFFERENTIAL/PLATELET
BASOS PCT: 0 %
Basophils Absolute: 0 cells/uL (ref 0–200)
EOS ABS: 130 {cells}/uL (ref 15–500)
EOS PCT: 2 %
HCT: 34.9 % — ABNORMAL LOW (ref 35.0–45.0)
Hemoglobin: 11.9 g/dL (ref 11.7–15.5)
LYMPHS PCT: 23 %
Lymphs Abs: 1495 cells/uL (ref 850–3900)
MCH: 30 pg (ref 27.0–33.0)
MCHC: 34.1 g/dL (ref 32.0–36.0)
MCV: 87.9 fL (ref 80.0–100.0)
MONOS PCT: 12 %
MPV: 9 fL (ref 7.5–12.5)
Monocytes Absolute: 780 cells/uL (ref 200–950)
NEUTROS ABS: 4095 {cells}/uL (ref 1500–7800)
Neutrophils Relative %: 63 %
PLATELETS: 172 10*3/uL (ref 140–400)
RBC: 3.97 MIL/uL (ref 3.80–5.10)
RDW: 12.6 % (ref 11.0–15.0)
WBC: 6.5 10*3/uL (ref 3.8–10.8)

## 2017-05-22 LAB — LIPASE: Lipase: <5 U/L — ABNORMAL LOW (ref 7–60)

## 2017-05-22 MED ORDER — CIPROFLOXACIN HCL 500 MG PO TABS: 500.0000 mg | ORAL_TABLET | Freq: Two times a day (BID) | ORAL | 0 refills | Status: DC

## 2017-05-22 MED ORDER — METRONIDAZOLE 500 MG PO TABS: 500.0000 mg | ORAL_TABLET | Freq: Three times a day (TID) | ORAL | 0 refills | Status: DC

## 2017-05-22 NOTE — Patient Instructions (Signed)
Thank you for coming in today.  Get labs now.  Drink the barium contrast here before labs.  Go to [facility] for the CT scan.  We will contact you with results.  If your belly pain worsens, or you have high fever, bad vomiting, blood in your stool or black tarry stool go to the Emergency Room.    Abdominal Pain, Adult Abdominal pain can be caused by many things. Often, abdominal pain is not serious and it gets better with no treatment or by being treated at home. However, sometimes abdominal pain is serious. Your health care provider will do a medical history and a physical exam to try to determine the cause of your abdominal pain. Follow these instructions at home:  Take over-the-counter and prescription medicines only as told by your health care provider. Do not take a laxative unless told by your health care provider.  Drink enough fluid to keep your urine clear or pale yellow.  Watch your condition for any changes.  Keep all follow-up visits as told by your health care provider. This is important. Contact a health care provider if:  Your abdominal pain changes or gets worse.  You are not hungry or you lose weight without trying.  You are constipated or have diarrhea for more than 2-3 days.  You have pain when you urinate or have a bowel movement.  Your abdominal pain wakes you up at night.  Your pain gets worse with meals, after eating, or with certain foods.  You are throwing up and cannot keep anything down.  You have a fever. Get help right away if:  Your pain does not go away as soon as your health care provider told you to expect.  You cannot stop throwing up.  Your pain is only in areas of the abdomen, such as the right side or the left lower portion of the abdomen.  You have bloody or black stools, or stools that look like tar.  You have severe pain, cramping, or bloating in your abdomen.  You have signs of dehydration, such as:  Dark urine, very  little urine, or no urine.  Cracked lips.  Dry mouth.  Sunken eyes.  Sleepiness.  Weakness. This information is not intended to replace advice given to you by your health care provider. Make sure you discuss any questions you have with your health care provider. Document Released: 09/26/2005 Document Revised: 07/06/2016 Document Reviewed: 05/30/2016 Elsevier Interactive Patient Education  2017 [employer].

## 2017-05-22 NOTE — Telephone Encounter (Signed)
Discussed ct scan results wt patient

## 2017-05-22 NOTE — Progress Notes (Signed)
[name] is a 51 y.o. female who presents to [facility] today for right lower quadrant abdominal pain. Patient notes a day and a half history of right lower quadrant abdominal pain associated with diarrhea and nausea. She denies any vomiting. She's tried taking Zofran which helps. She has a history of several abdominal surgeries including C-section hysterectomy and cholecystectomy. She still has her appendix. No chest pain or palpitations. Patient notes that she is allergic to IV contrast.    Past Medical History:  Diagnosis Date  . ACUT MI ANTEROLAT WALL SUBSQT EPIS CARE   . Acute maxillary sinusitis   . ALLERGIC RHINITIS, SEASONAL   . ARTHRITIS, RHEUMATOID   . Atrial fibrillation ([place])    a. after CABG.  . CAD, ARTERY BYPASS GRAFT    a. DES to RCA in 2010 then LAD occlusion s/p CABG 3 06/07/2009 with LIMA to LAD, reverse SVG to D1, reverse SVG to distal RCA. b. Cath 05/08/2016 slightly hypodense region in the intermediate branch, however she had excellent flow, FFR was normal. Vein graft to PDA and the posterolateral branch is patent, patent LIMA to LAD, occluded SVG to diagonal.  . CARPAL TUNNEL SYNDROME, BILATERAL   . CHOLELITHIASIS   . Contrast media allergy   . DERMATITIS, ALLERGIC   . DIABETES MELLITUS, TYPE I    on insulin pump  . DIABETIC  RETINOPATHY   . HYPERLIPIDEMIA-MIXED   . HYPERTHYROIDISM   . Insulin pump in place   . MIGRAINE W/O AURA W/INTRACT W/STATUS MIGRAINOSUS 02/19/2008  . SINUS TACHYCARDIA 11/08/2010  . TRIGGER FINGER   . URI    Past Surgical History:  Procedure Laterality Date  . ABDOMINAL HYSTERECTOMY    . Caesarean section    . CARDIAC CATHETERIZATION N/A 05/08/2016   Procedure: Left Heart Cath and Cors/Grafts Angiography;  Surgeon: Burnell Blanks, MD;  Location: [facility];  Service: Cardiovascular;  Laterality: N/A;    . CARPAL TUNNEL RELEASE    . CHOLECYSTECTOMY    . CORONARY ARTERY BYPASS GRAFT    . VITRECTOMY     Social History  Substance Use Topics  . Smoking status: Never Smoker  . Smokeless tobacco: Never Used  . Alcohol use Yes     Comment: rarely 1 every 6 months   family history includes Stroke in her paternal grandmother.  ROS as above:  Medications: Current Outpatient Prescriptions  Medication Sig Dispense Refill  . aspirin 81 MG tablet Take 81 mg by mouth daily.     . Beclomethasone Dipropionate (QNASL) 80 MCG/ACT AERS Place 2 sprays into both nostrils daily. 1 Inhaler 11  . calcipotriene-betamethasone (TACLONEX SCALP) external suspension Apply daily to affected areas    . clopidogrel (PLAVIX) 75 MG tablet Take 1 tablet (75 mg total) by mouth daily. 90 tablet 3  . cyanocobalamin (,VITAMIN B-12,) 1000 MCG/ML injection Inject 1 mL (1,000 mcg total) into the muscle every 30 (thirty) days. 6 mL 0  . cyclobenzaprine (FLEXERIL) 10 MG tablet Take 1 tablet (10 mg total) by mouth at bedtime. 30 tablet 5  . dicyclomine (BENTYL) 10 MG capsule Take 1 capsule (10 mg total) by mouth 3 (three) times daily before meals. 90 capsule 2  . EPINEPHrine 0.15 MG/0.15ML IJ injection Inject 0.15 mLs (0.15 mg total) into the skin as needed. Inject as directed. 2 Device 1  . folic acid (FOLVITE) 1 MG tablet   3  . furosemide (  LASIX) 40 MG tablet Take 40 mg by mouth daily as needed for edema (When she feels like she is swelling).    . Golimumab 100 MG/ML SOAJ Inject one device under the skin once monthly 1 Syringe 1  . insulin lispro (HUMALOG) 100 UNIT/ML injection FOR USE IN INSULIN PUMP. TOTAL DAILY INSULIN DOSE = UP TO 90 UNITS.    Marland Kitchen isosorbide mononitrate (IMDUR) 30 MG 24 hr tablet Take 0.5 tablets (15 mg total) by mouth daily. 30 tablet 5  . leflunomide (ARAVA) 20 MG tablet TAKE 1 TABLET BY MOUTH DAILY 90 tablet 0  . losartan (COZAAR) 50 MG tablet Take 1 tablet (50 mg total) by mouth daily. 90 tablet 3  .  metoprolol tartrate (LOPRESSOR) 25 MG tablet TAKE 1 TABLET BY MOUTH TWICE DAILY 180 tablet 3  . montelukast (SINGULAIR) 10 MG tablet Take 1 tablet (10 mg total) by mouth at bedtime. 30 tablet 5  . nabumetone (RELAFEN) 750 MG tablet TAKE 1 TABLET BY MOUTH TWICE DAILY 60 tablet 3  . Omega-3 Fatty Acids (FISH OIL) 1200 MG CAPS Take 1,200 mg by mouth 2 (two) times daily.    . ONE TOUCH ULTRA TEST test strip   1  . pantoprazole (PROTONIX) 40 MG tablet   3  . potassium chloride (K-DUR) 10 MEQ tablet Take 1 tablet (10 mEq total) by mouth daily. 90 tablet 3  . rosuvastatin (CRESTOR) 10 MG tablet TAKE 1 TABLET (10 MG TOTAL) BY MOUTH DAILY. 30 tablet 0  . Suvorexant (BELSOMRA) 10 MG TABS Take 1 tablet by mouth at bedtime. 30 tablet 1  . Syringe/Needle, Disp, (SYRINGE 3CC/23GX1") 23G X 1" 3 ML MISC Use to inject Vit B12 monthly. 50 each 0  . topiramate (TOPAMAX) 100 MG tablet TAKE 1 TABLET (100 MG TOTAL) BY MOUTH 2 (TWO) TIMES DAILY. 60 tablet 1   No current facility-administered medications for this visit.    Allergies  Allergen Reactions  . Prochlorperazine Rash    Neuro problems per pt Neurological reaction  . Ramipril Swelling, Rash and Other (See Comments)  . Shellfish-Derived Products Swelling    Shrimp(Facial swelling) Shrimp(Facial swelling)  . Atorvastatin Rash    Elevated LFT's Elevated LFT's Elevated LFT's  . Compazine  [Prochlorperazine Edisylate] Other (See Comments)    Neurological reaction  . Etanercept Swelling and Rash  . Infliximab Rash  . Orencia [Abatacept] Rash  . Shellfish Allergy Swelling    Shrimp(Facial swelling)  . Tofacitinib Rash and Other (See Comments)    Severe abdominal pain Severe abdominal pain Severe abdominal pain  . Prochlorperazine Edisylate     unknown  . Amiodarone Nausea Only  . Rituximab Rash    Causes a rash    Health Maintenance Health Maintenance  Topic Date Due  . FOOT EXAM  10/30/1977  . OPHTHALMOLOGY EXAM  10/30/1977  . HIV  Screening  10/30/1982  . PAP SMEAR  05/31/2008  . PNEUMOCOCCAL POLYSACCHARIDE VACCINE (2) 10/16/2011  . TETANUS/TDAP  09/30/2013  . HEMOGLOBIN A1C  03/25/2017  . INFLUENZA VACCINE  07/31/2017     Exam:  BP 112/62   Pulse 89   Temp 98 F (36.7 C) (Oral)   Wt 171 lb (77.6 kg)   BMI 30.29 kg/m  Gen: Well NAD HEENT: EOMI,  MMM Lungs: Normal work of breathing. CTABL Heart: RRR no MRG Abd: NABS, Soft. Nondistended, Tender to palpation right lower quadrant with guarding present. Negative psoas sign. Exts: Brisk capillary refill, warm and well perfused.  Twelve-lead EKG shows normal sinus rhythm at 83 bpm. No ST segment elevation or depression. Low-voltage EKG. Not significantly changed from prior study.    No results found for this or any previous visit (from the past 72 hour(s)). No results found.    Assessment and Plan: 51 y.o. female with RLQ abdominal pain with TTP and guarding. This is concerning for appendicitis. Her health history is complicated and significant. She is immunocompromise. Plan for stat CBC with differential and metabolic panel and lipase. Additionally we'll obtain a stat CT scan of the abdomen and pelvis. IV contrast will not be used as she is allergic however we will be able to use oral barium-based contrast.  Will contact patient with results.   Orders Placed This Encounter  Procedures  . CT Abdomen Pelvis W Contrast    Standing Status:   Future    Standing Expiration Date:   08/22/2018    Order Specific Question:   If indicated for the ordered procedure, I authorize the administration of contrast media per Radiology protocol    Answer:   Yes    Order Specific Question:   Reason for Exam (SYMPTOM  OR DIAGNOSIS REQUIRED)    Answer:   RLQ abd pain. No IV contrast. Oral non iodine contrast ok    Order Specific Question:   Is patient pregnant?    Answer:   No    Order Specific Question:   Preferred imaging location?    Answer:   [facility] Specific Question:   Call Results- Best Contact Number?    Answer:   [phone]    Order Specific Question:   Radiology Contrast Protocol - do NOT remove file path    Answer:   [email or web address]  . CBC with Differential/Platelet  . COMPLETE METABOLIC PANEL WITH GFR  . Lipase   No orders of the defined types were placed in this encounter.    Discussed warning signs or symptoms. Please see discharge instructions. Patient expresses understanding.

## 2017-05-23 ENCOUNTER — Encounter: Payer: Self-pay | Admitting: Physician Assistant

## 2017-05-23 ENCOUNTER — Other Ambulatory Visit: Payer: Self-pay | Admitting: Cardiovascular Disease

## 2017-05-23 ENCOUNTER — Other Ambulatory Visit: Payer: Self-pay | Admitting: Physician Assistant

## 2017-05-23 DIAGNOSIS — E785 Hyperlipidemia, unspecified: Secondary | ICD-10-CM

## 2017-05-23 MED FILL — metroNIDAZOLE 500 MG TABS: 500 | 7 days supply | Qty: 21 | Fill #0

## 2017-05-23 MED FILL — CIPROFLOXACIN HCL 500 MG TA: 500 | 7 days supply | Qty: 14 | Fill #0

## 2017-05-23 NOTE — Addendum Note (Signed)
Addended by: Huel Cote on: 05/23/2017 10:05 AM   Modules accepted: Orders

## 2017-05-23 NOTE — Telephone Encounter (Signed)
Refill Request.  

## 2017-05-24 NOTE — Telephone Encounter (Signed)
Pt needs to schedule appointment for further refills 

## 2017-05-28 MED FILL — ROSUVASTATIN CALCIUM 10 MG: 10 | 30 days supply | Qty: 30 | Fill #0

## 2017-05-30 ENCOUNTER — Other Ambulatory Visit: Payer: Self-pay

## 2017-05-30 ENCOUNTER — Telehealth: Payer: Self-pay

## 2017-05-30 MED ORDER — CLOPIDOGREL BISULFATE 75 MG PO TABS
75.0000 mg | ORAL_TABLET | Freq: Every day | ORAL | 0 refills | Status: DC
Start: 2017-05-30 — End: 2017-07-11

## 2017-05-30 NOTE — Telephone Encounter (Signed)
Noted patient's last refill of simponi  was on 05/06/17 at [facility].  I called patient with a refill reminder. Left message for patient to call back  Stefan Karen, Sharyn Blitz, CPhT 4:19 PM

## 2017-05-31 ENCOUNTER — Other Ambulatory Visit: Payer: Self-pay | Admitting: *Deleted

## 2017-05-31 NOTE — Patient Outreach (Signed)
Left message on [name]'s mobile number requesting she arrange [facility] follow up as soon as possible as she has not been seen since 01/02/17.  Barrington Ellison RN,CCM,CDE [facility] Management Coordinator [facility] Office Phone [phone] Office Fax [phone]

## 2017-05-31 NOTE — Telephone Encounter (Signed)
Patient returned your call.  CB#[phone].  Thank you.

## 2017-06-03 ENCOUNTER — Telehealth: Payer: Self-pay | Admitting: Cardiovascular Disease

## 2017-06-03 MED FILL — CLOPIDOGREL 75 MG TABLET: 75 | 30 days supply | Qty: 30 | Fill #0

## 2017-06-03 NOTE — Telephone Encounter (Signed)
New message    *STAT* If patient is at the pharmacy, call can be transferred to refill team.   1. Which medications need to be refilled? (please list name of each medication and dose if known) plavix  2. Which pharmacy/location (including street and city if local pharmacy) is medication to be sent to? [facility]  3. Do they need a 30 day or 90 day supply? 90 day supply

## 2017-06-03 NOTE — Telephone Encounter (Signed)
Called pharmacy to inform them that pt's [facility] [place] and pt needed to schedule an appointment. Pharmacy verbalized understanding.

## 2017-06-04 MED FILL — BD 3 ML SYRINGE WITH NEEDLE: 23G X 1" | 120 days supply | Qty: 4 | Fill #0

## 2017-06-06 ENCOUNTER — Encounter: Payer: Self-pay | Admitting: *Deleted

## 2017-06-06 ENCOUNTER — Other Ambulatory Visit: Payer: Self-pay | Admitting: *Deleted

## 2017-06-07 MED FILL — CONTOUR NEXT METER: W/DEVICE | 30 days supply | Qty: 1 | Fill #0

## 2017-06-07 MED FILL — MICROLET LANCETS: 34 days supply | Qty: 200 | Fill #0

## 2017-06-07 MED FILL — CONTOUR NEXT STRIPS: 34 days supply | Qty: 200 | Fill #0

## 2017-06-10 MED FILL — MONTELUKAST SOD 10 MG TAB: 10 | 30 days supply | Qty: 30 | Fill #2

## 2017-06-10 MED FILL — NABUMETONE 750 MG TABLET: 750 | 30 days supply | Qty: 60 | Fill #2

## 2017-06-10 MED FILL — TOPIRAMATE 100 MG TABLET: 100 | 30 days supply | Qty: 60 | Fill #1

## 2017-06-10 MED FILL — CYCLOBENZAPRINE 10 MG TAB: 10 | 30 days supply | Qty: 30 | Fill #5

## 2017-06-10 MED FILL — ISOSORBIDE MN ER 30 MG TAB: 30 | 30 days supply | Qty: 30 | Fill #9

## 2017-06-10 MED FILL — SIMPONI 100 MG/ML SOAJ: 100 | 30 days supply | Qty: 1 | Fill #1

## 2017-06-10 NOTE — Patient Outreach (Signed)
[facility]) Care Management   06/06/2017  [name] 1966/03/22 [id number]  [name] is an 51 y.o. female who presents to the [place] [facility] office for routine Link To Wellness follow up for self management assistance with Type I DM, hyperlipidemia, CAD and obesity. One of her 59 year old twin daughters accompanies her.   Subjective: [name] says she remains in good control as evidenced by her Hgb A1C meeting target consistently. She most recently saw her endocrinologist on 04/26/17 and he still wants her to try the Medtronic 670 G hybrid loop system when her pump warranty expires in December 2018.  She defines her hypoglycemic threshold at 100 when awake and low 50's when asleep.  She says she continues to use a different basal rate on her pump when she is on steroids to keep her blood sugars in better control.  She reports she saw her primary care provider on 5/23 for abdominal pain, had a CT of her abdomen and has been referred to an gastroenterologist for cecum wall thickening. Her appointment is on 06/17/17. She says she had a colonoscopy 3 years ago and it was negative.  She reports that she continues to see Dr. Estanislado [name] every 3 months for her rheumatoid arthritis.   Objective:   Review of Systems  Constitutional: Negative.     Physical Exam  Constitutional: She is oriented to person, place, and time. She appears well-developed and well-nourished.  Respiratory: Effort normal.  Neurological: She is alert and oriented to person, place, and time.  Skin: Skin is warm and dry.  Psychiatric: She has a normal mood and affect. Her behavior is normal. Judgment and thought content normal.   Wt= 170.2 lbs BP= 110/60  Encounter Medications:   Outpatient Encounter Prescriptions as of 07/31/2016  Medication Sig Note  . aspirin 81 MG tablet Take 81 mg by mouth daily.    . clopidogrel (PLAVIX) 75 MG tablet Take 1 tablet (75 mg total) by  mouth daily.   . cyclobenzaprine (FLEXERIL) 10 MG tablet Take 10 mg by mouth at bedtime.  07/31/2016: Uses for sleep  . EPINEPHrine 0.15 MG/0.15ML SOAJ Inject 0.15 mg into the skin as needed. Inject as directed. 07/31/2016: For food allergies  . furosemide (LASIX) 40 MG tablet Take 40 mg by mouth daily as needed for edema (When she feels like she is swelling).   . Golimumab (SIMPONI) 100 MG/ML SOAJ Inject 100 mg into the skin every 30 (thirty) days.   . insulin lispro (HUMALOG) 100 UNIT/ML injection FOR USE IN INSULIN PUMP. TOTAL DAILY INSULIN DOSE = UP TO 90 UNITS. Pump settings: Basal rates- 12 am 1.8 units/hr,  3 am 1.8 units/hr, 6 am 1.8 units/hr, 10 pm- 1.9 units/hr Insulin/CHO ratio 1:9 Target Blood glucose= 110   . isosorbide mononitrate (IMDUR) 30 MG 24 hr tablet Take 0.5 tablets (15 mg total) by mouth daily.   Marland Kitchen leflunomide (ARAVA) 20 MG tablet Take 20 mg by mouth daily. 07/31/2016: For RA  . losartan (COZAAR) 50 MG tablet Take 1 tablet (50 mg total) by mouth daily.   . metoprolol tartrate (LOPRESSOR) 25 MG tablet TAKE 1 TABLET BY MOUTH TWICE DAILY   . nabumetone (RELAFEN) 500 MG tablet Take 500 mg by mouth 2 (two) times daily.    . Omega-3 Fatty Acids (FISH OIL) 1200 MG CAPS Take 1,200 mg by mouth 2 (two) times daily.   . potassium chloride (K-DUR) 10 MEQ tablet Take 1 tablet (  10 mEq total) by mouth daily. 07/31/2016: Take when she uses Lasix  . rosuvastatin (CRESTOR) 10 MG tablet Take 1 tablet (10 mg total) by mouth daily.   Marland Kitchen topiramate (TOPAMAX) 50 MG tablet Take 50 mg by mouth 2 (two) times daily.    No facility-administered encounter medications on file as of 07/31/2016.     Functional Status:   In your present state of health, do you have any difficulty performing the following activities: 06/06/2017 01/02/2017  Hearing? N N  Vision? N N  Difficulty concentrating or making decisions? N N  Walking or climbing stairs? N N  Dressing or bathing? N N  Doing errands, shopping? N N   Preparing Food and eating ? N -  Using the Toilet? N -  In the past six months, have you accidently leaked urine? N -  Do you have problems with loss of bowel control? N -  Managing your Medications? N -  Managing your Finances? N -  Housekeeping or managing your Housekeeping? N -  Some recent data might be hidden    Fall/Depression Screening:    PHQ 2/9 Scores 06/06/2017 03/13/2017 07/31/2016  PHQ - 2 Score 1 2 0    Assessment:   [employer] employee since January 2017 with good control of Type I DM (on pump) as evidenced by Hgb A1C= 6.9% on 04/26/17 previously 6.8% and history of good control with Hgb A1C range 6.4%- 7.0% for many years, also has rheumatoid arthritis followed by Dr. Estanislado [name] with most recent visit on 01/03/17, hyperlipidemia with normal lipid panel on 04/26/17, and  obese with current body mass index=  30.2  Plan:   [facility] CM Care Plan Problem One   Flowsheet Row Most Recent Value  Care Plan Problem One Type I DM  with good glycemic control as evidenced by blood sugars meeting target and Hgb A1C= 6.9 % on 04/26/17 hyperlipidemia with normal lipid panel on 04/26/17, CAD s/p MI and stent without problems requiring emergency room visit or inpatient hospitalization, , obese with current body mass index= 30.2  Role Documenting the Problem One  Care Management [place] for Problem One  Active  [facility] Long Term Goal (31-90 days) Ongoing good control of Type I DM as evidenced by 75% of CBGs meeting target without increased frequency of hypoglycemia and  consistently meeting Hgb A1C target of <7.0%, ongoing good control of hyperlipidemia as evidenced by normal lipid profile at each assessment,  No exacerbations of CAD requiring visits to emergency room or inpatient hospitalization, evidence of weight loss or no weight gain at each assessment  [facility] Long Term Goal Start Date 06/06/17  Interventions for Problem One Long Term Goal Reviewed medications and assessed medication adherence,  reviewed the Medtronic 670 G closed loop system and reviewed [employer] benefits that cover the system at 100%, reviewed pump settings and CBG readings and hypoglycemic episodes, email sent to Luetta Nutting at the [facility] op pharmacy requesting that Hadar be issued a glucometer and test strips that are compatible with her pump at no cost under the [employer] benefits, reviewed upcoming appointment with gastroenterologist on 6/18, with Dr. Patrecia Pour on 8/3 and with Dr. Zigmund Daniel on 12/17/17,  will arrange for routine Link To Wellness follow up in 6 months (December 2018)    RNCM to fax today's office visit note to her primary care provider and Dr. Steffanie Dunn.  RNCM will meet every 6 months with patient  to  assist with Type I DM  and weight management and assess her progress toward mutually set goals.  Barrington Ellison RN,CCM,CDE [place] Management Coordinator Link To Wellness Office Phone [phone] Office Fax [phone]

## 2017-06-17 ENCOUNTER — Ambulatory Visit: Payer: 59 | Admitting: Physician Assistant

## 2017-06-19 ENCOUNTER — Telehealth: Payer: Self-pay | Admitting: Emergency Medicine

## 2017-06-19 ENCOUNTER — Encounter: Payer: Self-pay | Admitting: Gastroenterology

## 2017-06-19 ENCOUNTER — Ambulatory Visit (INDEPENDENT_AMBULATORY_CARE_PROVIDER_SITE_OTHER): Payer: 59 | Admitting: Gastroenterology

## 2017-06-19 ENCOUNTER — Other Ambulatory Visit: Payer: 59

## 2017-06-19 VITALS — BP 98/68 | HR 80 | Ht 63.0 in | Wt 170.0 lb

## 2017-06-19 DIAGNOSIS — R9389 Abnormal findings on diagnostic imaging of other specified body structures: Secondary | ICD-10-CM

## 2017-06-19 DIAGNOSIS — R1031 Right lower quadrant pain: Secondary | ICD-10-CM

## 2017-06-19 DIAGNOSIS — E119 Type 2 diabetes mellitus without complications: Secondary | ICD-10-CM | POA: Diagnosis not present

## 2017-06-19 DIAGNOSIS — K219 Gastro-esophageal reflux disease without esophagitis: Secondary | ICD-10-CM | POA: Diagnosis not present

## 2017-06-19 DIAGNOSIS — R197 Diarrhea, unspecified: Secondary | ICD-10-CM | POA: Diagnosis not present

## 2017-06-19 DIAGNOSIS — Z794 Long term (current) use of insulin: Secondary | ICD-10-CM

## 2017-06-19 DIAGNOSIS — Z7902 Long term (current) use of antithrombotics/antiplatelets: Secondary | ICD-10-CM | POA: Insufficient documentation

## 2017-06-19 DIAGNOSIS — Z79899 Other long term (current) drug therapy: Secondary | ICD-10-CM

## 2017-06-19 DIAGNOSIS — R938 Abnormal findings on diagnostic imaging of other specified body structures: Secondary | ICD-10-CM

## 2017-06-19 DIAGNOSIS — IMO0001 Reserved for inherently not codable concepts without codable children: Secondary | ICD-10-CM

## 2017-06-19 MED ORDER — NA SULFATE-K SULFATE-MG SULF 17.5-3.13-1.6 GM/177ML PO SOLN: 1.0000 | ORAL | 0 refills | Status: DC

## 2017-06-19 MED ORDER — RANITIDINE HCL 150 MG PO TABS: 150.0000 mg | ORAL_TABLET | Freq: Every day | ORAL | 5 refills | Status: DC

## 2017-06-19 MED FILL — raNITIdine HCL 150 MG TABS: 150 | 30 days supply | Qty: 30 | Fill #0

## 2017-06-19 MED FILL — SUPREP BOWEL PREP KIT: 17.5-3.13-1 | 1 days supply | Qty: 354 | Fill #0

## 2017-06-19 NOTE — Telephone Encounter (Addendum)
   [name] 03/09/1966 [id number]  Dear Dr. Burt Knack:  We have scheduled the above named patient for a colonoscopy and endoscopy  procedure. Our records show that (s)he is on anticoagulation therapy.  Please advise as to whether the patient may come off their therapy of Plavix 5 days prior to their procedure.  Please route your response to Tinnie Gens, CMA or fax response to [phone].  Sincerely,    [facility]

## 2017-06-19 NOTE — Progress Notes (Signed)
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 She has some significant past medical history including insulin-dependent diabetes mellitus since she was 51 years old and currently has an insulin pump, hyperlipidemia, history of coronary artery disease with stents and then a CABG in 2010. She is on Plavix for that and is followed by Dr. Burt Knack. She is actually known to Dr. Fuller Plan for an in-hospital procedure in July 2010 at which time she had an EGD performed for complaints of epigastric abdominal pain with nausea and vomiting. That showed mild gastritis. She has never been seen in our office. She actually had a colonoscopy at [facility] in December 2015 at which time she was found to have a tubular adenoma removed at 60 cm. She has been some other biopsies performed that showed unremarkable colonic mucosa with focal lymphoid aggregates and no evidence of microscopic colitis. That study was performed for complaints of diarrhea. She tells me that she's had issues with some diarrhea on and off for several years.  She is actually here today to discuss a recent abnormal CT scan. She says that on May 23 she had rather sudden onset of right lower quadrant abdominal pain and was concerned about appendicitis. CT scan of the abdomen and pelvis without contrast (is allergic to contrast) was performed and showed that it was negative for appendicitis but there was mild wall thickening of the cecum without much surrounding inflammation. Was commented that the location would be slightly unusual for isolated colitis and that colonoscopy is recommended to exclude infiltrative cecal mass. Colon was otherwise normal. CBC, CMP, lipase were unremarkable. They did treat her with a seven-day course of Cipro and Flagyl and she did have some improvement in the pain, but notes that it is still sore and  tender. She says that since taking the antibiotics her diarrhea has worsened. She says that she is having 4-5 loose to mushy stools daily.  Denies seeing blood in her stools.  She also reports recent increase in her acid reflux issues. She is on pantoprazole 40 mg daily, which she has been taking in the evening for the past year. She says that her reflux symptoms are much worse at nighttime, waking her from sleep.  Referred here by Dr. Lynne Leader for abnormal CT scan of the colon.   Past Medical History:  Diagnosis Date  . ACUT MI ANTEROLAT WALL SUBSQT EPIS CARE   . Acute maxillary sinusitis   . ALLERGIC RHINITIS, SEASONAL   . ARTHRITIS, RHEUMATOID   . Atrial fibrillation ([place])    a. after CABG.  . CAD, ARTERY BYPASS GRAFT    a. DES to RCA in 2010 then LAD occlusion s/p CABG 3 06/07/2009 with LIMA to LAD, reverse SVG to D1, reverse SVG to distal RCA. b. Cath 05/08/2016 slightly hypodense region in the intermediate branch, however she had excellent flow, FFR was normal. Vein graft to PDA and the posterolateral branch is patent, patent LIMA to LAD, occluded SVG to diagonal.  . CARPAL TUNNEL SYNDROME, BILATERAL   . CHOLELITHIASIS   . Contrast media allergy   . DERMATITIS, ALLERGIC   . DIABETES MELLITUS, TYPE I    on insulin pump  . DIABETIC  RETINOPATHY   . HYPERLIPIDEMIA-MIXED   . HYPERTHYROIDISM   . Insulin pump in place   . MIGRAINE W/O AURA W/INTRACT W/STATUS MIGRAINOSUS 02/19/2008  .  SINUS TACHYCARDIA 11/08/2010  . TRIGGER FINGER   . URI    Past Surgical History:  Procedure Laterality Date  . ABDOMINAL HYSTERECTOMY    . Caesarean section    . CARDIAC CATHETERIZATION N/A 05/08/2016   Procedure: Left Heart Cath and Cors/Grafts Angiography;  Surgeon: Burnell Blanks, MD;  Location: [facility];  Service: Cardiovascular;  Laterality: N/A;  . CARPAL TUNNEL RELEASE    . CHOLECYSTECTOMY    . CORONARY ARTERY BYPASS GRAFT    . VITRECTOMY      reports that she has never  smoked. She has never used smokeless tobacco. She reports that she drinks alcohol. She reports that she does not use drugs. family history includes Stroke in her paternal grandmother. Allergies  Allergen Reactions  . Prochlorperazine Rash    Neuro problems per pt Neurological reaction  . Ramipril Swelling, Rash and Other (See Comments)  . Shellfish-Derived Products Swelling    Shrimp(Facial swelling) Shrimp(Facial swelling)  . Atorvastatin Rash    Elevated LFT's Elevated LFT's Elevated LFT's  . Compazine  [Prochlorperazine Edisylate] Other (See Comments)    Neurological reaction  . Etanercept Swelling and Rash  . Infliximab Rash  . Orencia [Abatacept] Rash  . Shellfish Allergy Swelling    Shrimp(Facial swelling)  . Tofacitinib Rash and Other (See Comments)    Severe abdominal pain Severe abdominal pain Severe abdominal pain  . Prochlorperazine Edisylate     unknown  . Amiodarone Nausea Only  . Rituximab Rash    Causes a rash      Outpatient Encounter Prescriptions as of 06/19/2017  Medication Sig  . aspirin 81 MG tablet Take 81 mg by mouth daily.   . Beclomethasone Dipropionate (QNASL) 80 MCG/ACT AERS Place 2 sprays into both nostrils daily.  . calcipotriene-betamethasone (TACLONEX SCALP) external suspension Apply daily to affected areas  . clopidogrel (PLAVIX) 75 MG tablet Take 1 tablet (75 mg total) by mouth daily.  . cyanocobalamin (,VITAMIN B-12,) 1000 MCG/ML injection Inject 1 mL (1,000 mcg total) into the muscle every 30 (thirty) days.  . cyclobenzaprine (FLEXERIL) 10 MG tablet Take 1 tablet (10 mg total) by mouth at bedtime.  . dicyclomine (BENTYL) 10 MG capsule Take 1 capsule (10 mg total) by mouth 3 (three) times daily before meals.  Marland Kitchen EPINEPHrine 0.15 MG/0.15ML IJ injection Inject 0.15 mLs (0.15 mg total) into the skin as needed. Inject as directed.  . folic acid (FOLVITE) 1 MG tablet   . furosemide (LASIX) 40 MG tablet Take 40 mg by mouth daily as needed for  edema (When she feels like she is swelling).  . Golimumab 100 MG/ML SOAJ Inject one device under the skin once monthly  . insulin lispro (HUMALOG) 100 UNIT/ML injection FOR USE IN INSULIN PUMP. TOTAL DAILY INSULIN DOSE = UP TO 90 UNITS.  Marland Kitchen isosorbide mononitrate (IMDUR) 30 MG 24 hr tablet Take 0.5 tablets (15 mg total) by mouth daily.  Marland Kitchen leflunomide (ARAVA) 20 MG tablet TAKE 1 TABLET BY MOUTH DAILY  . losartan (COZAAR) 50 MG tablet Take 1 tablet (50 mg total) by mouth daily.  . metoprolol tartrate (LOPRESSOR) 25 MG tablet TAKE 1 TABLET BY MOUTH TWICE DAILY  . montelukast (SINGULAIR) 10 MG tablet Take 1 tablet (10 mg total) by mouth at bedtime.  . nabumetone (RELAFEN) 750 MG tablet TAKE 1 TABLET BY MOUTH TWICE DAILY  . Omega-3 Fatty Acids (FISH OIL) 1200 MG CAPS Take 1,200 mg by mouth 2 (two) times daily.  . ONE TOUCH  ULTRA TEST test strip   . pantoprazole (PROTONIX) 40 MG tablet   . potassium chloride (K-DUR) 10 MEQ tablet Take 1 tablet (10 mEq total) by mouth daily.  . rosuvastatin (CRESTOR) 10 MG tablet TAKE 1 TABLET (10 MG TOTAL) BY MOUTH DAILY.  . Suvorexant (BELSOMRA) 10 MG TABS Take 1 tablet by mouth at bedtime.  . Syringe/Needle, Disp, (SYRINGE 3CC/23GX1") 23G X 1" 3 ML MISC Use to inject Vit B12 monthly.  . topiramate (TOPAMAX) 100 MG tablet TAKE 1 TABLET (100 MG TOTAL) BY MOUTH 2 (TWO) TIMES DAILY.  . [DISCONTINUED] ciprofloxacin (CIPRO) 500 MG tablet Take 1 tablet (500 mg total) by mouth 2 (two) times daily.  . [DISCONTINUED] metroNIDAZOLE (FLAGYL) 500 MG tablet Take 1 tablet (500 mg total) by mouth 3 (three) times daily.   No facility-administered encounter medications on file as of 06/19/2017.      REVIEW OF SYSTEMS  : All other systems reviewed and negative except where noted in the History of Present Illness.   PHYSICAL EXAM: BP 98/68   Pulse 80   Ht 5\' 3"  (1.6 m)   Wt 170 lb (77.1 kg)   BMI 30.11 kg/m  General: Well developed white female in no acute distress Head:  Normocephalic and atraumatic Eyes:  Sclerae anicteric, conjunctiva pink. Ears: Normal auditory acuity Lungs: Clear throughout to auscultation; no increased WOB. Heart: Regular rate and rhythm Abdomen: Soft, non-distended.  BS present.  Mild RLQ TTP. Rectal:  Will be done at the time of colonoscopy. Musculoskeletal: Symmetrical with no gross deformities  Skin: No lesions on visible extremities Extremities: No edema  Neurological: Alert oriented x 4, grossly non-focal Psychological:  Alert and cooperative. Normal mood and affect  ASSESSMENT AND PLAN: -51 year old female who was evaluated for complaints of right lower quadrant abdominal pain and found have an abnormal CT scan with thickening in the cecum through which they cannot exclude an infiltrative cecal mass. Onset of right lower quadrant abdominal pain was quite sudden in onset, indicating that it was possibly some type of infectious colitis type process. It did improve on a course of Cipro and Flagyl as well, but discomfort and soreness is still present.  Will schedule for colonoscopy with Dr. Fuller Plan to exclude malignancy, etc. -Diarrhea:  Chronically intermittent.  Worse since taking Cipro and flagyl.  Will check a Cdiff stool study. -GERD:  Severe at night-time despite taking PPI in the evening with dinner.  Will begin zantac 150 mg at bedtime and will schedule for EGD with Dr. Fuller Plan as well. -Chronic antiplatelet use with Plavix for history of CAD:  Hold Plavix for 5 days before procedure - will instruct when and how to resume after procedure. Risks and benefits of procedure including bleeding, perforation, infection, missed lesions, medication reactions and possible hospitalization or surgery if complications occur explained. Additional rare but real risk of cardiovascular event such as heart attack or ischemia/infarct of other organs off of Plavix explained and need to seek urgent help if this occurs. Will communicate by phone or EMR with  patient's prescribing provider, Dr. Burt Knack, to confirm that holding Plavix is reasonable in this case.  -IDDM:  Insulin will be adjusted prior to endoscopic procedure per protocol. Will resume normal dosing after procedure.   CC:  Gregor Hams, MD

## 2017-06-19 NOTE — Progress Notes (Signed)
Reviewed and agree with management plan.  Shanise Balch T. Sharnelle Cappelli, MD FACG 

## 2017-06-19 NOTE — Patient Instructions (Signed)
You have been scheduled for an endoscopy and colonoscopy. Please follow the written instructions given to you at your visit today. Please pick up your prep supplies at the pharmacy within the next 1-3 days. If you use inhalers (even only as needed), please bring them with you on the day of your procedure. Your physician has requested that you go to www.startemmi.com and enter the access code given to you at your visit today. This web site gives a general overview about your procedure. However, you should still follow specific instructions given to you by our office regarding your preparation for the procedure.  Your physician has requested that you go to the basement for lab work before leaving today.  We have sent the following medications to your pharmacy for you to pick up at your convenience: Zantac 150 mg at bedtime

## 2017-06-20 ENCOUNTER — Telehealth: Payer: Self-pay | Admitting: Cardiovascular Disease

## 2017-06-20 ENCOUNTER — Telehealth: Payer: Self-pay

## 2017-06-20 LAB — CLOSTRIDIUM DIFFICILE BY PCR: CDIFFPCR: NOT DETECTED

## 2017-06-20 NOTE — Telephone Encounter (Signed)
Need a call back asap:     [facility] Pre-operative Risk Assessment    Request for surgical clearance:  1. What type of surgery is being performed? Colonoscopy-Possible Colon Mass  2. When is this surgery scheduled? 06-25-17   3. Are there any medications that need to be held prior to surgery and how long?Need to know today or no later than first thing tomorrow morning. Can pt stop her Plavix   4. Name of physician performing surgery? Dr Lucio Edward   5. What is your office phone and fax number? [phone] and fax is [phone]   Glyn Ade 06/20/2017, 4:42 PM  _________________________________________________________________   (provider comments below)  .Marland Kitchen

## 2017-06-20 NOTE — Telephone Encounter (Signed)
I spoke with Sherri in GI and she is aware message has been sent to Dr Burt Knack.

## 2017-06-20 NOTE — Telephone Encounter (Signed)
The pt called the office to ask if she can have a colonoscopy 06/25/17 and hold Plavix starting tomorrow.  She states there is a cancellation for next Tuesday and otherwise it would be August before she can get procedure done. I instructed her to begin holding plavix tomorrow and I will forward this message to Dr Burt Knack for review and response to GI. We will follow-up with the pt tomorrow to advise if she can proceed.

## 2017-06-20 NOTE — Telephone Encounter (Signed)
Patient is offered an appt for Tuesday for a colonoscopy in the [place].  She will check with her employer tomorrow and call me in the morning.  I did advise her that we still have not heard back about her Plavix and I will try to get an answer from Dr. Burt Knack and Fuller Plan about holding it.  She will hold Plavix in the am, if we are not able to reach anyone tonight, until she hears from [place] tomorrow.

## 2017-06-20 NOTE — Telephone Encounter (Signed)
-----   Message from Ladene Artist, MD sent at 06/20/2017  4:23 PM EDT -----   ----- Message ----- From: Loralie Champagne, PA-C Sent: 06/20/2017   2:00 PM To: Ladene Artist, MD  She did not say specifically but she works for [employer] now.  Even if we separate the procedures your first available for a colonoscopy was August and I did not know if we should wait that long with a questionable mass on CT scan.  Jess   ----- Message ----- From: Ladene Artist, MD Sent: 06/19/2017   5:35 PM To: Loralie Champagne, PA-C  Yes separate procedures would be best with colonoscopy first. I see she is changing gastroenterologists from her GI at [facility] to [place]. Did she indicate why she is changing?    ----- Message ----- From: Ritta Slot Sent: 06/19/2017   5:33 PM To: Ladene Artist, MD  I just sent you the chart on this patient. I saw her in clinic today. Has abnormal CT scan for which we need to rule out infiltrative cecal mass. Your first available for any procedures was in August. She is scheduled for an EGD and colonoscopy, but I think that we need to at least move colonoscopy up sooner. EGD could wait until August and we could perform them separately if you have like to do that.  Let me know what you think and what you can do.  Thank you,  Jess

## 2017-06-21 ENCOUNTER — Other Ambulatory Visit: Payer: Self-pay | Admitting: Rheumatology

## 2017-06-21 ENCOUNTER — Other Ambulatory Visit: Payer: Self-pay | Admitting: Physician Assistant

## 2017-06-21 ENCOUNTER — Ambulatory Visit: Payer: 59 | Admitting: Physician Assistant

## 2017-06-21 MED FILL — FOLIC ACID 1 MG TABLET: 1 | 90 days supply | Qty: 180 | Fill #0

## 2017-06-21 MED FILL — HYDROXYCHLOROQUINE 200 MG T: 200 | 90 days supply | Qty: 135 | Fill #0

## 2017-06-21 MED FILL — PANTOPRAZOLE SOD DR 40 MG T: 40 | 30 days supply | Qty: 30 | Fill #0

## 2017-06-21 NOTE — Telephone Encounter (Signed)
I think this is fine. Ok to hold plavix and resume after colonoscopy.

## 2017-06-21 NOTE — Telephone Encounter (Signed)
Last Visit:01/01/17 Next Visit: 08/02/17  Okay to refill Folic Acid?

## 2017-06-21 NOTE — Telephone Encounter (Signed)
Last Visit: 05/02/17 Next Visit: 08/02/17 Labs: 05/22/17 Stable PLQ Eye Exam: 10/30/16 WNL  Okay to refill PLQ?

## 2017-06-21 NOTE — Telephone Encounter (Signed)
Spoke to patient again as there was another cancellation for Tuesday 06/25/17 but she states she feels "too rushed, and will just wait until August 3rd appointment" She states if anything becomes available in the next few weeks then we can call her.

## 2017-06-21 NOTE — Telephone Encounter (Signed)
I already offered and she is out of town that week on vacation and declined

## 2017-06-21 NOTE — Telephone Encounter (Signed)
I see a 2pm [facility] slot open on 7/16. Please hold that and offer that

## 2017-06-21 NOTE — Telephone Encounter (Signed)
Patient is still trying to get in touch with her supervisor about Tuesday.  She will call me back . Still awaiting permission to hold Plavix from Dr. Burt Knack as well.

## 2017-06-21 NOTE — Telephone Encounter (Signed)
Ladene Artist, MD sent to Marlon Pel, RN        Yes ok for 4 days off Plavix.   Previous Messages    ----- Message -----  From: Marlon Pel, RN  Sent: 06/20/2017  4:35 PM  To: Ladene Artist, MD   She is on Plavix and Tuesday she will only have 4 full days off Plavix. We are trying to reach her cardiologist . Are you ok with 4 days off?

## 2017-06-21 NOTE — Telephone Encounter (Signed)
ok 

## 2017-06-21 NOTE — Telephone Encounter (Signed)
Refill Request.  

## 2017-06-21 NOTE — Telephone Encounter (Signed)
FYI  Patient can not get off work for Tuesday, she works in [employer] and [employer] to get off.  She only has one day off a week and those days off  are not when Dr. Fuller Plan is in the [facility] [facility] [employer] [facility].  I will keep an eye out on the schedule for any openings.  She could possibly be available on 6/13.  I will call her if anything becomes available. Will leave double on for 8/3 unless we find an earlier opening

## 2017-06-21 NOTE — Telephone Encounter (Signed)
Spoke to patient she found someone to work for her on Tuesday 06/25/17 but the appointment was already taken. None of the other appointments work for her with her schedule. Patient will call back and continue to check if there are any cancellations.

## 2017-06-21 NOTE — Telephone Encounter (Signed)
Called patient and made aware of recommendations.  Will route to CMA as well as fax to # provided.  Patient verbalized understanding.

## 2017-06-24 ENCOUNTER — Ambulatory Visit: Payer: 59 | Admitting: Physician Assistant

## 2017-07-04 ENCOUNTER — Ambulatory Visit (INDEPENDENT_AMBULATORY_CARE_PROVIDER_SITE_OTHER): Payer: 59 | Admitting: Family Medicine

## 2017-07-04 VITALS — BP 118/73 | HR 73 | Temp 98.5°F | Ht 63.0 in | Wt 170.0 lb

## 2017-07-04 DIAGNOSIS — E538 Deficiency of other specified B group vitamins: Secondary | ICD-10-CM | POA: Insufficient documentation

## 2017-07-04 DIAGNOSIS — M25561 Pain in right knee: Secondary | ICD-10-CM

## 2017-07-04 DIAGNOSIS — E559 Vitamin D deficiency, unspecified: Secondary | ICD-10-CM | POA: Insufficient documentation

## 2017-07-04 NOTE — Progress Notes (Signed)
[name] is a 51 y.o. female who presents to [facility] today for right knee pain. Patient presents to clinic today complaining of pain in the right knee. This is been ongoing for a few days. She denies any injury. She notes some swelling along the posterior aspect of the knee. She notes the pain and swelling are consistent with her previous history of Baker's cyst due to rheumatoid arthritis flare. She's done well in the past with steroid injections and would like a steroid injection today if possible. Try to her over-the-counter medications which have not helped very much.   Past Medical History:  Diagnosis Date  . ACUT MI ANTEROLAT WALL SUBSQT EPIS CARE   . Acute maxillary sinusitis   . ALLERGIC RHINITIS, SEASONAL   . ARTHRITIS, RHEUMATOID   . Atrial fibrillation ([place])    a. after CABG.  . CAD, ARTERY BYPASS GRAFT    a. DES to RCA in 2010 then LAD occlusion s/p CABG 3 06/07/2009 with LIMA to LAD, reverse SVG to D1, reverse SVG to distal RCA. b. Cath 05/08/2016 slightly hypodense region in the intermediate branch, however she had excellent flow, FFR was normal. Vein graft to PDA and the posterolateral branch is patent, patent LIMA to LAD, occluded SVG to diagonal.  . CARPAL TUNNEL SYNDROME, BILATERAL   . CHOLELITHIASIS   . Contrast media allergy   . DERMATITIS, ALLERGIC   . DIABETES MELLITUS, TYPE I    on insulin pump  . DIABETIC  RETINOPATHY   . HYPERLIPIDEMIA-MIXED   . HYPERTHYROIDISM   . Insulin pump in place   . MIGRAINE W/O AURA W/INTRACT W/STATUS MIGRAINOSUS 02/19/2008  . SINUS TACHYCARDIA 11/08/2010  . TRIGGER FINGER   . URI    Past Surgical History:  Procedure Laterality Date  . ABDOMINAL HYSTERECTOMY    . Caesarean section    . CARDIAC CATHETERIZATION N/A 05/08/2016   Procedure: Left Heart Cath and Cors/Grafts Angiography;  Surgeon: Burnell Blanks, MD;  Location: [facility];  Service: Cardiovascular;   Laterality: N/A;  . CARPAL TUNNEL RELEASE    . CHOLECYSTECTOMY    . CORONARY ARTERY BYPASS GRAFT    . VITRECTOMY     Social History  Substance Use Topics  . Smoking status: Never Smoker  . Smokeless tobacco: Never Used  . Alcohol use Yes     Comment: rarely 1 every 6 months     ROS:  As above   Medications: Current Outpatient Prescriptions  Medication Sig Dispense Refill  . aspirin 81 MG tablet Take 81 mg by mouth daily.     . Beclomethasone Dipropionate (QNASL) 80 MCG/ACT AERS Place 2 sprays into both nostrils daily. 1 Inhaler 11  . calcipotriene-betamethasone (TACLONEX SCALP) external suspension Apply daily to affected areas    . clopidogrel (PLAVIX) 75 MG tablet Take 1 tablet (75 mg total) by mouth daily. 30 tablet 0  . cyanocobalamin (,VITAMIN B-12,) 1000 MCG/ML injection Inject 1 mL (1,000 mcg total) into the muscle every 30 (thirty) days. 6 mL 0  . cyclobenzaprine (FLEXERIL) 10 MG tablet Take 1 tablet (10 mg total) by mouth at bedtime. 30 tablet 5  . dicyclomine (BENTYL) 10 MG capsule Take 1 capsule (10 mg total) by mouth 3 (three) times daily before meals. 90 capsule 2  . EPINEPHrine 0.15 MG/0.15ML IJ injection Inject 0.15 mLs (0.15 mg total) into the skin as needed. Inject as directed. 2 Device 1  . folic acid (FOLVITE) 1  MG tablet TAKE TWO TABLETS BY MOUTH DAILY 180 tablet 3  . furosemide (LASIX) 40 MG tablet Take 40 mg by mouth daily as needed for edema (When she feels like she is swelling).    . Golimumab 100 MG/ML SOAJ Inject one device under the skin once monthly 1 Syringe 1  . hydroxychloroquine (PLAQUENIL) 200 MG tablet TAKE 1 TABLET BY MOUTH EVERY MORNING AND 1/2 TABLET EVERY EVENING 135 tablet 1  . insulin lispro (HUMALOG) 100 UNIT/ML injection FOR USE IN INSULIN PUMP. TOTAL DAILY INSULIN DOSE = UP TO 90 UNITS.    Marland Kitchen isosorbide mononitrate (IMDUR) 30 MG 24 hr tablet Take 0.5 tablets (15 mg total) by mouth daily. 30 tablet 5  . leflunomide (ARAVA) 20 MG tablet TAKE  1 TABLET BY MOUTH DAILY 90 tablet 0  . losartan (COZAAR) 50 MG tablet Take 1 tablet (50 mg total) by mouth daily. 90 tablet 3  . metoprolol tartrate (LOPRESSOR) 25 MG tablet TAKE 1 TABLET BY MOUTH TWICE DAILY 180 tablet 3  . montelukast (SINGULAIR) 10 MG tablet Take 1 tablet (10 mg total) by mouth at bedtime. 30 tablet 5  . Na Sulfate-K Sulfate-Mg Sulf 17.5-3.13-1.6 GM/180ML SOLN Take 1 kit by mouth as directed. 354 mL 0  . nabumetone (RELAFEN) 750 MG tablet TAKE 1 TABLET BY MOUTH TWICE DAILY 60 tablet 3  . Omega-3 Fatty Acids (FISH OIL) 1200 MG CAPS Take 1,200 mg by mouth 2 (two) times daily.    . ONE TOUCH ULTRA TEST test strip   1  . pantoprazole (PROTONIX) 40 MG tablet   3  . pantoprazole (PROTONIX) 40 MG tablet TAKE 1 TABLET (40 MG TOTAL) BY MOUTH DAILY. 30 tablet 0  . potassium chloride (K-DUR) 10 MEQ tablet Take 1 tablet (10 mEq total) by mouth daily. 90 tablet 3  . ranitidine (ZANTAC) 150 MG tablet Take 1 tablet (150 mg total) by mouth at bedtime. 30 tablet 5  . rosuvastatin (CRESTOR) 10 MG tablet TAKE 1 TABLET (10 MG TOTAL) BY MOUTH DAILY. 30 tablet 0  . Suvorexant (BELSOMRA) 10 MG TABS Take 1 tablet by mouth at bedtime. 30 tablet 1  . Syringe/Needle, Disp, (SYRINGE 3CC/23GX1") 23G X 1" 3 ML MISC Use to inject Vit B12 monthly. 50 each 0  . topiramate (TOPAMAX) 100 MG tablet TAKE 1 TABLET (100 MG TOTAL) BY MOUTH 2 (TWO) TIMES DAILY. 60 tablet 1   No current facility-administered medications for this visit.    Allergies  Allergen Reactions  . Prochlorperazine Rash    Neuro problems per pt Neurological reaction  . Ramipril Swelling, Rash and Other (See Comments)  . Shellfish-Derived Products Swelling    Shrimp(Facial swelling) Shrimp(Facial swelling)  . Atorvastatin Rash    Elevated LFT's Elevated LFT's Elevated LFT's  . Compazine  [Prochlorperazine Edisylate] Other (See Comments)    Neurological reaction  . Etanercept Swelling and Rash  . Infliximab Rash  . Orencia  [Abatacept] Rash  . Shellfish Allergy Swelling    Shrimp(Facial swelling)  . Tofacitinib Rash and Other (See Comments)    Severe abdominal pain Severe abdominal pain Severe abdominal pain  . Prochlorperazine Edisylate     unknown  . Amiodarone Nausea Only  . Rituximab Rash    Causes a rash     Exam:  BP 118/73 (BP Location: Left Arm, Patient Position: Sitting, Cuff Size: Normal)   Pulse 73   Temp 98.5 F (36.9 C) (Oral)   Ht '5\' 3"'  (1.6 m)   Wt 170  lb (77.1 kg)   SpO2 100%   BMI 30.11 kg/m  General: Well Developed, well nourished, and in no acute distress.  Neuro/Psych: Alert and oriented x3, extra-ocular muscles intact, able to move all 4 extremities, sensation grossly intact. Skin: Warm and dry, no rashes noted.  Respiratory: Not using accessory muscles, speaking in full sentences, trachea midline.  Cardiovascular: Pulses palpable, no extremity edema. Abdomen: Does not appear distended. MSK: Right knee mild effusion otherwise normal-appearing Normal range of motion. Tender to palpation medial joint line. Stable ligamentous exam.  Procedure: Real-time Ultrasound Guided Injection of right knee  Device: GE Logiq E  Images permanently stored and available for review in the ultrasound unit. Verbal informed consent obtained. Discussed risks and benefits of procedure. Warned about infection bleeding damage to structures skin hypopigmentation and fat atrophy among others. Patient expresses understanding and agreement Time-out conducted.  Noted no overlying erythema, induration, or other signs of local infection.  Skin prepped in a sterile fashion.  Local anesthesia: Topical Ethyl chloride.  With sterile technique and under real time ultrasound guidance: 58m depomedrol and 444mmarcaine injected easily.  Completed without difficulty  Pain immediately resolved suggesting accurate placement of the medication.  Advised to call if fevers/chills, erythema, induration,  drainage, or persistent bleeding.  Images permanently stored and available for review in the ultrasound unit.  Impression: Technically successful ultrasound guided injection.     No results found for this or any previous visit (from the past 48 hour(s)). No results found.    Assessment and Plan: [age].o. female with  Right knee pain and effusion.  Likely rheumatoid arthritis flare. Differential also includes medial meniscus injury. Plan for steroid injection today and follow-up as needed.  Additionally we'll check B-12 and vitamin D as these have been deficient in the past and are due for recheck.    Orders Placed This Encounter  Procedures  . Vitamin B12  . VITAMIN D 25 Hydroxy (Vit-D Deficiency, Fractures)   No orders of the defined types were placed in this encounter.   Discussed warning signs or symptoms. Please see discharge instructions. Patient expresses understanding.  CC: Dr DeEstanislado Pandy

## 2017-07-04 NOTE — Patient Instructions (Addendum)
Thank you for coming in today. Recheck as needed.  Let me know if you do not get better.  Call or go to the ER if you develop a large red swollen joint with extreme pain or oozing puss.   Get labs in about 1 month.

## 2017-07-09 ENCOUNTER — Ambulatory Visit: Payer: 59 | Admitting: Rheumatology

## 2017-07-09 NOTE — Progress Notes (Deleted)
Office Visit Note  Patient: [name]             Date of Birth: [birth year]           MRN: [id number]             PCP: [name] Referring: [name] Visit Date: 07/09/2017 Occupation: _0 @    Subjective:  No chief complaint on file.   History of Present Illness: [name] is a 51 y.o. female was recently seen by Dr. Estanislado [name] on 05/02/2017 for rheumatoid arthritis and high risk prescription. Chief complaint medication management For her rheumatoid arthritis (rheumatoid factor negative) CCP negative, positive ANA==> she takes it in the subcutaneous; 100 mg every month; Arava 20 mg daily; Plaquenil 200 mg by mouth daily.  Today,***    Activities of Daily Living:  Patient reports morning stiffness for *** {minute/hour:19697}.   Patient {ACTIONS;DENIES/REPORTS:21021675::"Denies"} nocturnal pain.  Difficulty dressing/grooming: {ACTIONS;DENIES/REPORTS:21021675::"Denies"} Difficulty climbing stairs: {ACTIONS;DENIES/REPORTS:21021675::"Denies"} Difficulty getting out of chair: {ACTIONS;DENIES/REPORTS:21021675::"Denies"} Difficulty using hands for taps, buttons, cutlery, and/or writing: {ACTIONS;DENIES/REPORTS:21021675::"Denies"}   Review of Systems  Constitutional: Negative for fatigue.  HENT: Negative for mouth sores and mouth dryness.   Eyes: Negative for dryness.  Respiratory: Negative for shortness of breath.   Gastrointestinal: Negative for constipation and diarrhea.  Musculoskeletal: Negative for myalgias and myalgias.  Skin: Negative for sensitivity to sunlight.  Psychiatric/Behavioral: Negative for decreased concentration and sleep disturbance.    PMFS History:  Patient Active Problem List   Diagnosis Date Noted  . Vitamin D deficiency 07/04/2017  . B12 deficiency 07/04/2017  . Abnormal CT scan 06/19/2017  . RLQ abdominal pain 06/19/2017  . Gastroesophageal reflux disease 06/19/2017  . Antiplatelet or antithrombotic long-term  use 06/19/2017  . Cecum mass 05/22/2017  . Sinus pressure 03/15/2017  . Headache around the eyes 03/15/2017  . Nasal congestion 03/15/2017  . Graves disease 02/01/2017  . Sleep difficulties 02/01/2017  . No energy 02/01/2017  . Osteopenia 02/01/2017  . Chest pain syndrome 05/08/2016  . Hypertension, essential 05/08/2016  . Hyperlipidemia, mixed 05/08/2016  . IDDM (insulin dependent diabetes mellitus) ([place]) 05/08/2016  . Coronary artery disease involving native coronary artery of native heart with angina pectoris ([place])   . HYPERTHYROIDISM 11/09/2010  . SINUS TACHYCARDIA 11/08/2010  . SHORTNESS OF BREATH 11/08/2010  . DIARRHEA 11/08/2010  . ACUTE MAXILLARY SINUSITIS 10/16/2010  . DERMATITIS, ALLERGIC 07/20/2010  . EDEMA 07/12/2010  . DIZZINESS 11/14/2009  . ATRIAL FIBRILLATION 07/20/2009  . CAD, ARTERY BYPASS GRAFT 06/07/2009  . ANGINA, STABLE/EXERTIONAL 06/02/2009  . HYPERLIPIDEMIA-MIXED 04/26/2009  . ACUT MI ANTEROLAT WALL SUBSQT EPIS CARE 04/26/2009  . CHEST PAIN 04/26/2009  . DIABETIC  RETINOPATHY 04/25/2009  . CARPAL TUNNEL SYNDROME, BILATERAL 04/25/2009  . TRIGGER FINGER 04/25/2009  . MIGRAINE W/O AURA W/INTRACT W/STATUS MIGRAINOSUS 02/19/2008  . COMMON MIGRAINE 01/24/2007  . URI 01/24/2007  . DIABETES MELLITUS, TYPE I 10/16/2006  . ESSENTIAL HYPERTENSION 10/16/2006  . ALLERGIC RHINITIS, SEASONAL 10/16/2006  . CHOLELITHIASIS 10/16/2006  . Rheumatoid arthritis ([place]) 10/16/2006    Past Medical History:  Diagnosis Date  . ACUT MI ANTEROLAT WALL SUBSQT EPIS CARE   . Acute maxillary sinusitis   . ALLERGIC RHINITIS, SEASONAL   . ARTHRITIS, RHEUMATOID   . Atrial fibrillation ([place])    a. after CABG.  . CAD, ARTERY BYPASS GRAFT    a. DES to RCA in 2010 then LAD occlusion s/p CABG 3 06/07/2009 with LIMA to LAD, reverse SVG to D1, reverse  SVG to distal RCA. b. Cath 05/08/2016 slightly hypodense region in the intermediate branch, however she had excellent flow, FFR was normal.  Vein graft to PDA and the posterolateral branch is patent, patent LIMA to LAD, occluded SVG to diagonal.  . CARPAL TUNNEL SYNDROME, BILATERAL   . CHOLELITHIASIS   . Contrast media allergy   . DERMATITIS, ALLERGIC   . DIABETES MELLITUS, TYPE I    on insulin pump  . DIABETIC  RETINOPATHY   . HYPERLIPIDEMIA-MIXED   . HYPERTHYROIDISM   . Insulin pump in place   . MIGRAINE W/O AURA W/INTRACT W/STATUS MIGRAINOSUS 02/19/2008  . SINUS TACHYCARDIA 11/08/2010  . TRIGGER FINGER   . URI     Family History  Problem Relation Age of Onset  . Diabetes Unknown   . Heart disease Unknown   . Hypertension Unknown   . Hyperlipidemia Unknown   . Depression Unknown   . Migraines Unknown   . Stroke Paternal Grandmother   . Heart attack Neg Hx   . Colon cancer Neg Hx   . Stomach cancer Neg Hx   . Esophageal cancer Neg Hx    Past Surgical History:  Procedure Laterality Date  . ABDOMINAL HYSTERECTOMY    . Caesarean section    . CARDIAC CATHETERIZATION N/A 05/08/2016   Procedure: Left Heart Cath and Cors/Grafts Angiography;  Surgeon: Burnell Blanks, MD;  Location: [facility];  Service: Cardiovascular;  Laterality: N/A;  . CARPAL TUNNEL RELEASE    . CHOLECYSTECTOMY    . CORONARY ARTERY BYPASS GRAFT    . VITRECTOMY     Social History   Social History Narrative   Divorced. Has 2 kids(fraternal twins) daughters age 51. Works at [employer], Never smoked, denies ETOH, no drugs. Drinks diet coke. No exercise.            Objective: Vital Signs: There were no vitals taken for this visit.   Physical Exam  Constitutional: She is oriented to person, place, and time. She appears well-developed and well-nourished.  HENT:  Head: Normocephalic and atraumatic.  Eyes: EOM are normal. Pupils are equal, round, and reactive to light.  Cardiovascular: Normal rate, regular rhythm and normal heart sounds.  Exam reveals no gallop and no friction rub.   No murmur heard. Pulmonary/Chest:  Effort normal and breath sounds normal. She has no wheezes. She has no rales.  Abdominal: Soft. Bowel sounds are normal. She exhibits no distension. There is no tenderness. There is no guarding. No hernia.  Musculoskeletal: Normal range of motion. She exhibits no edema, tenderness or deformity.  Lymphadenopathy:    She has no cervical adenopathy.  Neurological: She is alert and oriented to person, place, and time. Coordination normal.  Skin: Skin is warm and dry. Capillary refill takes less than 2 seconds. No rash noted.  Psychiatric: She has a normal mood and affect. Her behavior is normal.  Nursing note and vitals reviewed.    Musculoskeletal Exam: ***  CDAI Exam: No CDAI exam completed.    Investigation: No additional findings. Appointment on 06/19/2017  Component Date Value Ref Range Status  . Toxigenic C Difficile by pcr 06/19/2017 Not Detected  Not Detected Final   Comment: This test is for use only with liquid or soft stools; performance characteristics of other clinical specimen types have not been established.   This assay was performed by Cepheid GeneXpert(R) PCR. The performance characteristics of this assay have been determined by [employer]. Performance characteristics refer to  the analytical performance of the test.   Office Visit on 05/22/2017  Component Date Value Ref Range Status  . WBC 05/22/2017 6.5  3.8 - 10.8 K/uL Final  . RBC 05/22/2017 3.97  3.80 - 5.10 MIL/uL Final  . Hemoglobin 05/22/2017 11.9  11.7 - 15.5 g/dL Final  . HCT 05/22/2017 34.9* 35.0 - 45.0 % Final  . MCV 05/22/2017 87.9  80.0 - 100.0 fL Final  . MCH 05/22/2017 30.0  27.0 - 33.0 pg Final  . MCHC 05/22/2017 34.1  32.0 - 36.0 g/dL Final  . RDW 05/22/2017 12.6  11.0 - 15.0 % Final  . Platelets 05/22/2017 172  140 - 400 K/uL Final  . MPV 05/22/2017 9.0  7.5 - 12.5 fL Final  . Neutro Abs 05/22/2017 4095  1,500 - 7,800 cells/uL Final  . Lymphs Abs 05/22/2017 1495  850 - 3,900  cells/uL Final  . Monocytes Absolute 05/22/2017 780  200 - 950 cells/uL Final  . Eosinophils Absolute 05/22/2017 130  15 - 500 cells/uL Final  . Basophils Absolute 05/22/2017 0  0 - 200 cells/uL Final  . Neutrophils Relative % 05/22/2017 63  % Final  . Lymphocytes Relative 05/22/2017 23  % Final  . Monocytes Relative 05/22/2017 12  % Final  . Eosinophils Relative 05/22/2017 2  % Final  . Basophils Relative 05/22/2017 0  % Final  . Smear Review 05/22/2017 Criteria for review not met   Final  . Sodium 05/22/2017 142  135 - 146 mmol/L Final  . Potassium 05/22/2017 4.2  3.5 - 5.3 mmol/L Final  . Chloride 05/22/2017 108  98 - 110 mmol/L Final  . CO2 05/22/2017 23  20 - 31 mmol/L Final  . Glucose, Bld 05/22/2017 240* 65 - 99 mg/dL Final  . BUN 05/22/2017 10  7 - 25 mg/dL Final  . Creat 05/22/2017 0.89  0.50 - 1.10 mg/dL Final  . Total Bilirubin 05/22/2017 0.6  0.2 - 1.2 mg/dL Final  . Alkaline Phosphatase 05/22/2017 75  33 - 115 U/L Final  . AST 05/22/2017 29  10 - 35 U/L Final  . ALT 05/22/2017 29  6 - 29 U/L Final  . Total Protein 05/22/2017 6.5  6.1 - 8.1 g/dL Final  . Albumin 05/22/2017 4.4  3.6 - 5.1 g/dL Final  . Calcium 05/22/2017 9.3  8.6 - 10.2 mg/dL Final  . GFR, Est African American 05/22/2017 88  >=60 mL/min Final  . GFR, Est Non African American 05/22/2017 76  >=60 mL/min Final  . Lipase 05/22/2017 <5* 7 - 60 U/L Final  Office Visit on 05/02/2017  Component Date Value Ref Range Status  . WBC 05/02/2017 6.2  3.8 - 10.8 K/uL Final  . RBC 05/02/2017 4.48  3.80 - 5.10 MIL/uL Final  . Hemoglobin 05/02/2017 13.2  11.7 - 15.5 g/dL Final  . HCT 05/02/2017 39.9  35.0 - 45.0 % Final  . MCV 05/02/2017 89.1  80.0 - 100.0 fL Final  . MCH 05/02/2017 29.5  27.0 - 33.0 pg Final  . MCHC 05/02/2017 33.1  32.0 - 36.0 g/dL Final  . RDW 05/02/2017 13.6  11.0 - 15.0 % Final  . Platelets 05/02/2017 213  140 - 400 K/uL Final  . MPV 05/02/2017 9.4  7.5 - 12.5 fL Final  . Neutro Abs 05/02/2017  4030  1,500 - 7,800 cells/uL Final  . Lymphs Abs 05/02/2017 1488  850 - 3,900 cells/uL Final  . Monocytes Absolute 05/02/2017 434  200 - 950 cells/uL Final  .  Eosinophils Absolute 05/02/2017 186  15 - 500 cells/uL Final  . Basophils Absolute 05/02/2017 62  0 - 200 cells/uL Final  . Neutrophils Relative % 05/02/2017 65  % Final  . Lymphocytes Relative 05/02/2017 24  % Final  . Monocytes Relative 05/02/2017 7  % Final  . Eosinophils Relative 05/02/2017 3  % Final  . Basophils Relative 05/02/2017 1  % Final  . Smear Review 05/02/2017 Criteria for review not met   Final  Abstract on 02/25/2017  Component Date Value Ref Range Status  . Hemoglobin 07/16/2016 14.5  12.0 - 16.0 g/dL Final  . HCT 07/16/2016 42  36 - 46 % Final  . Neutrophils Absolute 07/16/2016 4  /L Final  . Platelets 07/16/2016 207  150 - 399 K/L Final  . WBC 07/16/2016 4.8  10^3/mL Final  . Vit D, 25-Hydroxy 07/16/2016 38   Final  . Glucose 07/16/2016 119  mg/dL Final  . BUN 07/16/2016 7  4 - 21 mg/dL Final  . Creatinine 07/16/2016 0.7  0.5 - 1.1 mg/dL Final  . Potassium 07/16/2016 4.0  3.4 - 5.3 mmol/L Final  . Sodium 07/16/2016 142  137 - 147 mmol/L Final  . Alkaline Phosphatase 07/16/2016 72  25 - 125 U/L Final  . ALT 07/16/2016 15  7 - 35 U/L Final  . AST 07/16/2016 18  13 - 35 U/L Final  . Bilirubin, Total 07/16/2016 0.5  mg/dL Final  Office Visit on 02/25/2017  Component Date Value Ref Range Status  . Vitamin B-12 03/22/2017 334  200 - 1,100 pg/mL Final  . Vit D, 25-Hydroxy 03/22/2017 29* 30 - 100 ng/mL Final   Comment: Vitamin D Status           25-OH Vitamin D        Deficiency                <20 ng/mL        Insufficiency         20 - 29 ng/mL        Optimal             > or = 30 ng/mL   For 25-OH Vitamin D testing on patients on D2-supplementation and patients for whom quantitation of D2 and D3 fractions is required, the [facility] 25-OH VIT D, (D2,D3), LC/MS/MS is recommended: order code [id number]  (patients > 2 yrs).   Office Visit on 01/28/2017  Component Date Value Ref Range Status  . Vit D, 25-Hydroxy 01/28/2017 30  30 - 100 ng/mL Final   Comment: Vitamin D Status           25-OH Vitamin D        Deficiency                <20 ng/mL        Insufficiency         20 - 29 ng/mL        Optimal             > or = 30 ng/mL   For 25-OH Vitamin D testing on patients on D2-supplementation and patients for whom quantitation of D2 and D3 fractions is required, the [facility] 25-OH VIT D, (D2,D3), LC/MS/MS is recommended: order code [phone] (patients > 2 yrs).   . Vitamin B-12 01/28/2017 237  200 - 1,100 pg/mL Final  . WBC 01/28/2017 4.9  3.8 - 10.8 K/uL Final  . RBC 01/28/2017 4.60  3.80 - 5.10 MIL/uL  Final  . Hemoglobin 01/28/2017 13.6  11.7 - 15.5 g/dL Final  . HCT 01/28/2017 40.3  35.0 - 45.0 % Final  . MCV 01/28/2017 87.6  80.0 - 100.0 fL Final  . MCH 01/28/2017 29.6  27.0 - 33.0 pg Final  . MCHC 01/28/2017 33.7  32.0 - 36.0 g/dL Final  . RDW 01/28/2017 13.3  11.0 - 15.0 % Final  . Platelets 01/28/2017 212  140 - 400 K/uL Final  . MPV 01/28/2017 9.5  7.5 - 12.5 fL Final  . Neutro Abs 01/28/2017 2254  1,500 - 7,800 cells/uL Final  . Lymphs Abs 01/28/2017 1764  850 - 3,900 cells/uL Final  . Monocytes Absolute 01/28/2017 441  200 - 950 cells/uL Final  . Eosinophils Absolute 01/28/2017 392  15 - 500 cells/uL Final  . Basophils Absolute 01/28/2017 49  0 - 200 cells/uL Final  . Neutrophils Relative % 01/28/2017 46  % Final  . Lymphocytes Relative 01/28/2017 36  % Final  . Monocytes Relative 01/28/2017 9  % Final  . Eosinophils Relative 01/28/2017 8  % Final  . Basophils Relative 01/28/2017 1  % Final  . Smear Review 01/28/2017 Criteria for review not met   Final  . Ferritin 01/28/2017 72  10 - 232 ng/mL Final  . TSH 01/28/2017 1.22  mIU/L Final   Comment:   Reference Range   > or = 20 Years  0.40-4.50   Pregnancy Range First trimester  0.26-2.66 Second trimester  0.55-2.73 Third trimester  0.43-2.91        Imaging: No results found.  Speciality Comments: No specialty comments available.    Procedures:  No procedures performed Allergies: Prochlorperazine; Ramipril; Shellfish-derived products; Atorvastatin; Compazine  [prochlorperazine edisylate]; Etanercept; Infliximab; Orencia [abatacept]; Shellfish allergy; Tofacitinib; Prochlorperazine edisylate; Amiodarone; and Rituximab   Assessment / Plan:     Visit Diagnoses: No diagnosis found.    Plan: #1: Rheumatoid arthritis  #2: High risk prescription [facility] Subcutaneous 100 mg every month Arava 20 mg daily Plaquenil 200 mg daily Labs from May 2018 are normal  #3  Orders: No orders of the defined types were placed in this encounter.  No orders of the defined types were placed in this encounter.   Face-to-face time spent with patient was 30 minutes. 50% of time was spent in counseling and coordination of care.  Follow-Up Instructions: No Follow-up on file.   Eliezer Lofts, PA-C  Note - This record has been created using [employer].  Chart creation errors have been sought, but may not always  have been located. Such creation errors do not reflect on  the standard of medical care.

## 2017-07-10 ENCOUNTER — Other Ambulatory Visit: Payer: Self-pay | Admitting: Cardiovascular Disease

## 2017-07-10 DIAGNOSIS — E785 Hyperlipidemia, unspecified: Secondary | ICD-10-CM

## 2017-07-10 MED FILL — LOSARTAN POTASSIUM 50 MG TA: 50 | 90 days supply | Qty: 90 | Fill #3

## 2017-07-11 ENCOUNTER — Encounter: Payer: Self-pay | Admitting: Cardiovascular Disease

## 2017-07-11 ENCOUNTER — Ambulatory Visit (INDEPENDENT_AMBULATORY_CARE_PROVIDER_SITE_OTHER): Payer: 59 | Admitting: Cardiovascular Disease

## 2017-07-11 DIAGNOSIS — I10 Essential (primary) hypertension: Secondary | ICD-10-CM | POA: Diagnosis not present

## 2017-07-11 MED ORDER — CLOPIDOGREL BISULFATE 75 MG PO TABS: 75.0000 mg | ORAL_TABLET | Freq: Every day | ORAL | 3 refills | Status: DC

## 2017-07-11 MED ORDER — ROSUVASTATIN CALCIUM 10 MG PO TABS: 10.0000 mg | ORAL_TABLET | Freq: Every day | ORAL | 3 refills | Status: DC

## 2017-07-11 MED ORDER — POTASSIUM CHLORIDE ER 10 MEQ PO TBCR: 10.0000 meq | EXTENDED_RELEASE_TABLET | Freq: Every day | ORAL | 3 refills | Status: DC

## 2017-07-11 MED ORDER — ISOSORBIDE MONONITRATE ER 30 MG PO TB24: 15.0000 mg | ORAL_TABLET | Freq: Every day | ORAL | 3 refills | Status: DC

## 2017-07-11 MED ORDER — PANTOPRAZOLE SODIUM 40 MG PO TBEC: 40.0000 mg | DELAYED_RELEASE_TABLET | Freq: Every day | ORAL | 3 refills | Status: DC

## 2017-07-11 MED ORDER — METOPROLOL TARTRATE 25 MG PO TABS: 25.0000 mg | ORAL_TABLET | Freq: Two times a day (BID) | ORAL | 3 refills | Status: DC

## 2017-07-11 MED ORDER — LOSARTAN POTASSIUM 50 MG PO TABS: 50.0000 mg | ORAL_TABLET | Freq: Every day | ORAL | 3 refills | Status: DC

## 2017-07-11 MED FILL — METOPROLOL TARTRATE 25 MG T: 25 | 90 days supply | Qty: 180 | Fill #0

## 2017-07-11 MED FILL — CLOPIDOGREL 75 MG TABLET: 75 | 90 days supply | Qty: 90 | Fill #0

## 2017-07-11 MED FILL — ISOSORBIDE MN ER 30 MG TAB: 30 | 90 days supply | Qty: 45 | Fill #0

## 2017-07-11 MED FILL — ROSUVASTATIN CALCIUM 10 MG: 10 | 90 days supply | Qty: 90 | Fill #0

## 2017-07-11 MED FILL — POTASSIUM CL 10 MEQ TAB SA: 10 | 90 days supply | Qty: 90 | Fill #0

## 2017-07-11 NOTE — Patient Instructions (Signed)

## 2017-07-11 NOTE — Progress Notes (Signed)
Cardiology Office Note Date:  07/11/2017   ID:  Lisa, [name] 07-04-1966, MRN [id number]  PCP:  Donella Stade, PA-C  Cardiologist:  Sherren Mocha, MD    Chief Complaint  Patient presents with  . Follow-up    CAD     History of Present Illness: [name] is a 51 y.o. female who presents for follow-up evaluation. She has premature coronary artery disease and underwent CABG in 2010. She initially presented with an inferior wall MI and was treated with stenting of the right coronary artery. However her LAD was totally occluded and she was ultimately treated with multivessel CABG. She's had no further ischemic events.  The patient presented last year with chest discomfort concerning for unstable angina. Cardiac catheterization was performed and demonstrated moderate coronary artery disease with patency of her bypass grafts to the LAD and RCA. The saphenous vein graft to diagonal is occluded. Pressure wire analysis of the ramus intermedius was done and it was negative for ischemia. She's done well since that time. She is here alone today for follow-up. She's had no recent chest pain, chest pressure, or shortness of breath. She does complain of generalized fatigue and attributes this to her autoimmune problems.   Past Medical History:  Diagnosis Date  . ACUT MI ANTEROLAT WALL SUBSQT EPIS CARE   . Acute maxillary sinusitis   . ALLERGIC RHINITIS, SEASONAL   . ARTHRITIS, RHEUMATOID   . Atrial fibrillation ([place])    a. after CABG.  . CAD, ARTERY BYPASS GRAFT    a. DES to RCA in 2010 then LAD occlusion s/p CABG 3 06/07/2009 with LIMA to LAD, reverse SVG to D1, reverse SVG to distal RCA. b. Cath 05/08/2016 slightly hypodense region in the intermediate branch, however she had excellent flow, FFR was normal. Vein graft to PDA and the posterolateral branch is patent, patent LIMA to LAD, occluded SVG to diagonal.  . CARPAL TUNNEL SYNDROME, BILATERAL   . CHOLELITHIASIS   . Contrast media  allergy   . DERMATITIS, ALLERGIC   . DIABETES MELLITUS, TYPE I    on insulin pump  . DIABETIC  RETINOPATHY   . HYPERLIPIDEMIA-MIXED   . HYPERTHYROIDISM   . Insulin pump in place   . MIGRAINE W/O AURA W/INTRACT W/STATUS MIGRAINOSUS 02/19/2008  . SINUS TACHYCARDIA 11/08/2010  . TRIGGER FINGER   . URI     Past Surgical History:  Procedure Laterality Date  . ABDOMINAL HYSTERECTOMY    . Caesarean section    . CARDIAC CATHETERIZATION N/A 05/08/2016   Procedure: Left Heart Cath and Cors/Grafts Angiography;  Surgeon: Burnell Blanks, MD;  Location: [facility];  Service: Cardiovascular;  Laterality: N/A;  . CARPAL TUNNEL RELEASE    . CHOLECYSTECTOMY    . CORONARY ARTERY BYPASS GRAFT    . VITRECTOMY      Current Outpatient Prescriptions  Medication Sig Dispense Refill  . aspirin 81 MG tablet Take 81 mg by mouth daily.     . Beclomethasone Dipropionate (QNASL) 80 MCG/ACT AERS Place 2 sprays into both nostrils daily. 1 Inhaler 11  . calcipotriene-betamethasone (TACLONEX SCALP) external suspension Apply daily to affected areas    . clopidogrel (PLAVIX) 75 MG tablet Take 1 tablet (75 mg total) by mouth daily. 90 tablet 3  . cyanocobalamin (,VITAMIN B-12,) 1000 MCG/ML injection Inject 1 mL (1,000 mcg total) into the muscle every 30 (thirty) days. 6 mL 0  . cyclobenzaprine (FLEXERIL) 10 MG tablet Take 1 tablet (10  mg total) by mouth at bedtime. 30 tablet 5  . dicyclomine (BENTYL) 10 MG capsule Take 1 capsule (10 mg total) by mouth 3 (three) times daily before meals. 90 capsule 2  . EPINEPHrine 0.15 MG/0.15ML IJ injection Inject 0.15 mLs (0.15 mg total) into the skin as needed. Inject as directed. 2 Device 1  . folic acid (FOLVITE) 1 MG tablet TAKE TWO TABLETS BY MOUTH DAILY 180 tablet 3  . furosemide (LASIX) 40 MG tablet Take 40 mg by mouth daily as needed for edema (When she feels like she is swelling).    . Golimumab 100 MG/ML SOAJ Inject one device under the skin once monthly 1  Syringe 1  . hydroxychloroquine (PLAQUENIL) 200 MG tablet TAKE 1 TABLET BY MOUTH EVERY MORNING AND 1/2 TABLET EVERY EVENING 135 tablet 1  . insulin lispro (HUMALOG) 100 UNIT/ML injection FOR USE IN INSULIN PUMP. TOTAL DAILY INSULIN DOSE = UP TO 90 UNITS.    Marland Kitchen isosorbide mononitrate (IMDUR) 30 MG 24 hr tablet Take 0.5 tablets (15 mg total) by mouth daily. 45 tablet 3  . leflunomide (ARAVA) 20 MG tablet TAKE 1 TABLET BY MOUTH DAILY 90 tablet 0  . losartan (COZAAR) 50 MG tablet Take 1 tablet (50 mg total) by mouth daily. 90 tablet 3  . metoprolol tartrate (LOPRESSOR) 25 MG tablet Take 1 tablet (25 mg total) by mouth 2 (two) times daily. 180 tablet 3  . montelukast (SINGULAIR) 10 MG tablet Take 1 tablet (10 mg total) by mouth at bedtime. 30 tablet 5  . nabumetone (RELAFEN) 750 MG tablet TAKE 1 TABLET BY MOUTH TWICE DAILY 60 tablet 3  . Omega-3 Fatty Acids (FISH OIL) 1200 MG CAPS Take 1,200 mg by mouth 2 (two) times daily.    . ONE TOUCH ULTRA TEST test strip   1  . pantoprazole (PROTONIX) 40 MG tablet Take 1 tablet (40 mg total) by mouth daily. 90 tablet 3  . potassium chloride (K-DUR) 10 MEQ tablet Take 1 tablet (10 mEq total) by mouth daily. 90 tablet 3  . ranitidine (ZANTAC) 150 MG tablet Take 1 tablet (150 mg total) by mouth at bedtime. 30 tablet 5  . rosuvastatin (CRESTOR) 10 MG tablet Take 1 tablet (10 mg total) by mouth daily. 90 tablet 3  . Suvorexant (BELSOMRA) 10 MG TABS Take 1 tablet by mouth at bedtime. 30 tablet 1  . Syringe/Needle, Disp, (SYRINGE 3CC/23GX1") 23G X 1" 3 ML MISC Use to inject Vit B12 monthly. 50 each 0  . topiramate (TOPAMAX) 100 MG tablet TAKE 1 TABLET (100 MG TOTAL) BY MOUTH 2 (TWO) TIMES DAILY. 60 tablet 1   No current facility-administered medications for this visit.     Allergies:   Prochlorperazine; Ramipril; Shellfish-derived products; Atorvastatin; Compazine  [prochlorperazine edisylate]; Etanercept; Infliximab; Orencia [abatacept]; Shellfish allergy;  Tofacitinib; Prochlorperazine edisylate; Amiodarone; and Rituximab   Social History:  The patient  reports that she has never smoked. She has never used smokeless tobacco. She reports that she drinks alcohol. She reports that she does not use drugs.   Family History:  The patient's family history includes Depression in her unknown relative; Diabetes in her unknown relative; Heart disease in her unknown relative; Hyperlipidemia in her unknown relative; Hypertension in her unknown relative; Migraines in her unknown relative; Stroke in her paternal grandmother.    ROS:  Please see the history of present illness.   All other systems are reviewed and negative.    PHYSICAL EXAM: VS:  BP 122/66  Pulse 88   Ht 5\' 3"  (1.6 m)   Wt 172 lb (78 kg)   SpO2 98%   BMI 30.47 kg/m  , BMI Body mass index is 30.47 kg/m. GEN: Well nourished, well developed, in no acute distress  HEENT: normal  Neck: no JVD, no masses. No carotid bruits Cardiac: RRR without murmur or gallop                Respiratory:  clear to auscultation bilaterally, normal work of breathing GI: soft, nontender, nondistended, + BS MS: no deformity or atrophy  Ext: no pretibial edema, pedal pulses 2+= bilaterally Skin: warm and dry, no rash Neuro:  Strength and sensation are intact Psych: euthymic mood, full affect  EKG:  EKG is not ordered today.  Recent Labs: 01/28/2017: TSH 1.22 05/22/2017: ALT 29; BUN 10; Creat 0.89; Hemoglobin 11.9; Platelets 172; Potassium 4.2; Sodium 142   Lipid Panel     Component Value Date/Time   CHOL 151 05/08/2016 0412   TRIG 101 05/08/2016 0412   HDL 69 05/08/2016 0412   CHOLHDL 2.2 05/08/2016 0412   VLDL 20 05/08/2016 0412   LDLCALC 62 05/08/2016 0412      Wt Readings from Last 3 Encounters:  07/11/17 172 lb (78 kg)  07/04/17 170 lb (77.1 kg)  06/19/17 170 lb (77.1 kg)     Cardiac Studies Reviewed: Cardiac Cath 05/08/2016: Conclusion   1. Severe double vessel CAD s/p 3V CABG 2. The  LAD has diffuse severe proximal stenosis. There is both antegrade flow down the LAD and retrograde filling from the patent LIMA graft. The Diagonal that was supplied by the vein graft is small in caliber. The vein graft to the diagonal is occluded.  3. The Circumflex is small in caliber and has mild plaque disease. The intermediate branch is large in caliber. The intermediate branch has a proximal 30% stenosis, slightly hypodense in appearance but with excellent flow down the vessel. (FFR of this vessel is 0.91 with infusion of adenosine suggesting no significant flow obstruction). It is unclear if this represents a non-flow limiting plaque rupture or hypodense calcific lesion.  4. The RCA is previously stented in the mid segment. The mid stented segment is completely occluded. The vein graft to the PDA and posterolateral branches is patent.   Recommendations: She presented with continuous chest pain for 48 hours with no objective evidence of ischemia (negative troponin, no ischemic EKG changes). No culprit lesion is noted. As above, the mildly hypodense plaque in the intermediate branch could represent plaque rupture without flow obstruction but FFR is in a normal range. I do not think this lesion is causing her chest pain given the excellent flow down the vessel. I will treat with ASA, Plavix in case of plaque rupture and will start Imdur. Consider GI related cause of her chest pain.    Diagnostic Diagram        ASSESSMENT AND PLAN: 1.  CAD, native vessel, without angina: The patient appears to be stable from a cardiac perspective. She will continue on dual antiplatelet therapy with aspirin and clopidogrel. She is on an ARB in the context of her diabetes and also a beta blocker. She is tolerating Crestor for lipid lowering.  2. Hyperlipidemia: Treated with Crestor. Lipids are followed by her primary physician. Most recent lipids showed cholesterol 195, HDL 85, LDL 96. Statin dose is limited by side  effects profile.  3. Hypertension: Blood pressure is controlled on her current medicines. Most recent labs  from 04/26/2017 are reviewed with a creatinine of 0.89 and potassium 4.1. Transaminases are normal.   Current medicines are reviewed with the patient today.  The patient does not have concerns regarding medicines.  Labs/ tests ordered today include:  No orders of the defined types were placed in this encounter.   Disposition:   FU one year  Signed, Sherren Mocha, MD  07/11/2017 5:09 PM    [facility] [address], [place], [place]  [address] Phone: [phone]; Fax: [phone]

## 2017-07-15 MED FILL — PANTOPRAZOLE SOD DR 40 MG T: 40 | 90 days supply | Qty: 90 | Fill #0

## 2017-07-16 ENCOUNTER — Other Ambulatory Visit: Payer: Self-pay | Admitting: Pharmacist

## 2017-07-16 ENCOUNTER — Other Ambulatory Visit: Payer: Self-pay | Admitting: *Deleted

## 2017-07-16 MED ORDER — GOLIMUMAB 100 MG/ML ~~LOC~~ SOAJ: SUBCUTANEOUS | 1 refills | Status: DC

## 2017-07-16 MED FILL — SIMPONI 100 MG/ML SOAJ: 100 | 28 days supply | Qty: 1 | Fill #0

## 2017-07-16 NOTE — Telephone Encounter (Signed)
Refill request received via fax for Simponi  Last Visit: 01/01/17 Next Visit: 08/02/17 Labs: 05/22/17 Stable TB Gold: 09/19/16 Neg  Okay to refill per Dr. Estanislado Pandy

## 2017-07-22 ENCOUNTER — Other Ambulatory Visit: Payer: Self-pay | Admitting: Rheumatology

## 2017-07-22 MED FILL — MONTELUKAST SOD 10 MG TAB: 10 | 30 days supply | Qty: 30 | Fill #3

## 2017-07-22 MED FILL — NABUMETONE 750 MG TABLET: 750 | 30 days supply | Qty: 60 | Fill #3

## 2017-07-22 MED FILL — raNITIdine HCL 150 MG TABS: 150 | 30 days supply | Qty: 30 | Fill #1

## 2017-07-22 MED FILL — CYCLOBENZAPRINE 10 MG TABLE: 10 | 30 days supply | Qty: 30 | Fill #0

## 2017-07-22 NOTE — Telephone Encounter (Signed)
Last Visit: 01/01/17 Next Visit: 08/02/17  Okay to refill per Dr. Estanislado Pandy

## 2017-07-23 ENCOUNTER — Encounter: Payer: Self-pay | Admitting: Gastroenterology

## 2017-07-29 NOTE — Progress Notes (Signed)
Office Visit Note  Patient: [name]             Date of Birth: April 01, 1966           MRN: [id number]             PCP: Lavada Mesi Referring: Donella Stade, PA-C Visit Date: 08/02/2017 Occupation: @GUAROCC @    Subjective:  Stiffness.   History of Present Illness: [name] is a 51 y.o. female with history of seronegative rheumatoid arthritis. She states she continues to have some joint stiffness. She is doing quite well on the current combination of medications. She states about a month ago she developed increased pain in her right knee joint with Baker cyst. She was seen by a sports medicine doctor who injected her knee joint with the cortisone and relieve the discomfort. She continues to have discomfort in her right shoulder, bilateral hands and her right knee joint. She is not having much swelling currently.  Activities of Daily Living:  Patient reports morning stiffness for 30 minutes.   Patient Reports nocturnal pain.  Difficulty dressing/grooming: Denies Difficulty climbing stairs: Reports Difficulty getting out of chair: Reports Difficulty using hands for taps, buttons, cutlery, and/or writing: Denies   Review of Systems  Constitutional: Positive for fatigue. Negative for night sweats, weight gain, weight loss and weakness.  HENT: Negative for mouth sores, trouble swallowing, trouble swallowing, mouth dryness and nose dryness.   Eyes: Positive for dryness. Negative for pain, redness and visual disturbance.  Respiratory: Negative for cough, shortness of breath and difficulty breathing.   Cardiovascular: Negative for chest pain, palpitations, hypertension, irregular heartbeat and swelling in legs/feet.  Gastrointestinal: Negative for blood in stool, constipation and diarrhea.  Endocrine: Negative for increased urination.  Genitourinary: Negative for vaginal dryness.  Musculoskeletal: Positive for arthralgias, joint pain and morning stiffness. Negative  for joint swelling, myalgias, muscle weakness, muscle tenderness and myalgias.  Skin: Positive for color change. Negative for rash, hair loss, skin tightness, ulcers and sensitivity to sunlight.  Allergic/Immunologic: Negative for susceptible to infections.  Neurological: Negative for dizziness, memory loss and night sweats.  Hematological: Negative for swollen glands.  Psychiatric/Behavioral: Positive for sleep disturbance. Negative for depressed mood. The patient is not nervous/anxious.     PMFS History:  Patient Active Problem List   Diagnosis Date Noted  . Vitamin D deficiency 07/04/2017  . B12 deficiency 07/04/2017  . Abnormal CT scan 06/19/2017  . RLQ abdominal pain 06/19/2017  . Gastroesophageal reflux disease 06/19/2017  . Antiplatelet or antithrombotic long-term use 06/19/2017  . Cecum mass 05/22/2017  . Sinus pressure 03/15/2017  . Headache around the eyes 03/15/2017  . Nasal congestion 03/15/2017  . Graves disease 02/01/2017  . Sleep difficulties 02/01/2017  . No energy 02/01/2017  . Osteopenia 02/01/2017  . Chest pain syndrome 05/08/2016  . Hypertension, essential 05/08/2016  . Hyperlipidemia, mixed 05/08/2016  . IDDM (insulin dependent diabetes mellitus) ([place]) 05/08/2016  . Coronary artery disease involving native coronary artery of native heart with angina pectoris ([place])   . HYPERTHYROIDISM 11/09/2010  . SINUS TACHYCARDIA 11/08/2010  . SHORTNESS OF BREATH 11/08/2010  . DIARRHEA 11/08/2010  . ACUTE MAXILLARY SINUSITIS 10/16/2010  . DERMATITIS, ALLERGIC 07/20/2010  . EDEMA 07/12/2010  . DIZZINESS 11/14/2009  . ATRIAL FIBRILLATION 07/20/2009  . CAD, ARTERY BYPASS GRAFT 06/07/2009  . ANGINA, STABLE/EXERTIONAL 06/02/2009  . HYPERLIPIDEMIA-MIXED 04/26/2009  . ACUT MI ANTEROLAT WALL SUBSQT EPIS CARE 04/26/2009  . CHEST PAIN 04/26/2009  .  DIABETIC  RETINOPATHY 04/25/2009  . CARPAL TUNNEL SYNDROME, BILATERAL 04/25/2009  . TRIGGER FINGER 04/25/2009  . MIGRAINE W/O  AURA W/INTRACT W/STATUS MIGRAINOSUS 02/19/2008  . COMMON MIGRAINE 01/24/2007  . URI 01/24/2007  . DIABETES MELLITUS, TYPE I 10/16/2006  . ESSENTIAL HYPERTENSION 10/16/2006  . ALLERGIC RHINITIS, SEASONAL 10/16/2006  . CHOLELITHIASIS 10/16/2006  . Rheumatoid arthritis ([place]) 10/16/2006    Past Medical History:  Diagnosis Date  . ACUT MI ANTEROLAT WALL SUBSQT EPIS CARE   . Acute maxillary sinusitis   . ALLERGIC RHINITIS, SEASONAL   . ARTHRITIS, RHEUMATOID   . Atrial fibrillation ([place])    a. after CABG.  . CAD, ARTERY BYPASS GRAFT    a. DES to RCA in 2010 then LAD occlusion s/p CABG 3 06/07/2009 with LIMA to LAD, reverse SVG to D1, reverse SVG to distal RCA. b. Cath 05/08/2016 slightly hypodense region in the intermediate branch, however she had excellent flow, FFR was normal. Vein graft to PDA and the posterolateral branch is patent, patent LIMA to LAD, occluded SVG to diagonal.  . CARPAL TUNNEL SYNDROME, BILATERAL   . CHOLELITHIASIS   . Contrast media allergy   . DERMATITIS, ALLERGIC   . DIABETES MELLITUS, TYPE I    on insulin pump  . DIABETIC  RETINOPATHY   . HYPERLIPIDEMIA-MIXED   . HYPERTHYROIDISM   . Insulin pump in place   . MIGRAINE W/O AURA W/INTRACT W/STATUS MIGRAINOSUS 02/19/2008  . SINUS TACHYCARDIA 11/08/2010  . TRIGGER FINGER   . URI     Family History  Problem Relation Age of Onset  . Diabetes Unknown   . Heart disease Unknown   . Hypertension Unknown   . Hyperlipidemia Unknown   . Depression Unknown   . Migraines Unknown   . Stroke Paternal Grandmother   . Heart attack Neg Hx   . Colon cancer Neg Hx   . Stomach cancer Neg Hx   . Esophageal cancer Neg Hx    Past Surgical History:  Procedure Laterality Date  . ABDOMINAL HYSTERECTOMY    . Caesarean section    . CARDIAC CATHETERIZATION N/A 05/08/2016   Procedure: Left Heart Cath and Cors/Grafts Angiography;  Surgeon: Burnell Blanks, MD;  Location: [facility];  Service: Cardiovascular;  Laterality:  N/A;  . CARPAL TUNNEL RELEASE    . CHOLECYSTECTOMY    . CORONARY ARTERY BYPASS GRAFT    . VITRECTOMY     Social History   Social History Narrative   Divorced. Has 2 kids(fraternal twins) daughters age 56. Works at [employer], Never smoked, denies ETOH, no drugs. Drinks diet coke. No exercise.            Objective: Vital Signs: BP 116/66 (BP Location: Left Arm, Patient Position: Sitting, Cuff Size: Normal)   Pulse 71   Ht 5\' 3"  (1.6 m)   Wt 169 lb (76.7 kg)   BMI 29.94 kg/m    Physical Exam  Constitutional: She is oriented to person, place, and time. She appears well-developed and well-nourished.  HENT:  Head: Normocephalic and atraumatic.  Eyes: Conjunctivae and EOM are normal.  Neck: Normal range of motion.  Cardiovascular: Normal rate, regular rhythm, normal heart sounds and intact distal pulses.   Pulmonary/Chest: Effort normal and breath sounds normal.  Abdominal: Soft. Bowel sounds are normal.  Lymphadenopathy:    She has no cervical adenopathy.  Neurological: She is alert and oriented to person, place, and time.  Skin: Skin is warm and dry. Capillary refill takes  less than 2 seconds. Rash noted.  Psoriasis patches noted on bilateral elbows and her bilateral knee joints.  Psychiatric: She has a normal mood and affect. Her behavior is normal.  Nursing note and vitals reviewed.    Musculoskeletal Exam: C-spine and thoracic lumbar spine good range of motion. She is some discomfort range of motion of her right shoulder joint. She also describes discomfort in her elbow joints with no synovitis was noted. She has limited range of motion of her wrist joints but no synovitis. She has no synovitis over her MCP joints. She has mild left DIP thickening. Hip joints knee joints ankles MTPs PIPs are good range of motion with no synovitis.  CDAI Exam: CDAI Homunculus Exam:   Tenderness:  RUE: glenohumeral  Joint Counts:  CDAI Tender Joint count: 1 CDAI Swollen  Joint count: 0  Global Assessments:  Patient Global Assessment: 5 Provider Global Assessment: 3  CDAI Calculated Score: 9    Investigation: Findings:  09/19/2016 negative TB gold  Last eye exam December 2017 per patient CBC Latest Ref Rng & Units 05/22/2017 05/02/2017 01/28/2017  WBC 3.8 - 10.8 K/uL 6.5 6.2 4.9  Hemoglobin 11.7 - 15.5 g/dL 11.9 13.2 13.6  Hematocrit 35.0 - 45.0 % 34.9(L) 39.9 40.3  Platelets 140 - 400 K/uL 172 213 212   CMP     Component Value Date/Time   NA 142 05/22/2017 1529   NA 142 07/16/2016   K 4.2 05/22/2017 1529   CL 108 05/22/2017 1529   CO2 23 05/22/2017 1529   GLUCOSE 240 (H) 05/22/2017 1529   BUN 10 05/22/2017 1529   BUN 7 07/16/2016   CREATININE 0.89 05/22/2017 1529   CALCIUM 9.3 05/22/2017 1529   PROT 6.5 05/22/2017 1529   ALBUMIN 4.4 05/22/2017 1529   AST 29 05/22/2017 1529   ALT 29 05/22/2017 1529   ALKPHOS 75 05/22/2017 1529   BILITOT 0.6 05/22/2017 1529   GFRNONAA 76 05/22/2017 1529   GFRAA 88 05/22/2017 1529     Imaging: No results found.  Speciality Comments: No specialty comments available.    Procedures:  No procedures performed Allergies: Prochlorperazine; Ramipril; Shellfish-derived products; Atorvastatin; Compazine  [prochlorperazine edisylate]; Etanercept; Infliximab; Orencia [abatacept]; Shellfish allergy; Tofacitinib; Prochlorperazine edisylate; Amiodarone; and Rituximab   Assessment / Plan:     Visit Diagnoses: Rheumatoid arthritis of multiple sites with negative rheumatoid factor (HCC) - RF negative, anti-CCP negative, positive ANA. She has no synovitis on examination today. She continues to have some arthralgias. She appears to be doing quite well on current combination of therapy.  High risk medication use - Plaquenil 200 mg po q AM and 100 mg po qPM, Arava 20 mg by mouth daily , Simponi Sub Q 100 mg q month, Nabumetone 750 mg po bid - Plan: CBC with Differential/Platelet, COMPLETE METABOLIC PANEL WITH GFR,  Quantiferon tb gold assay (blood), we will also check following labs every 3 months. CBC with Differential/Platelet, COMPLETE METABOLIC PANEL WITH GFR  Psoriasis: She has mild psoriasis secondary to anti-TNF usage.  Osteopenia of multiple sites: She takes calcium and vitamin D.  Vitamin D deficiency - Plan: VITAMIN D 25 Hydroxy (Vit-D Deficiency, Fractures)  History of diabetes mellitus: She is on insulin  History of gastroesophageal reflux (GERD): I discussed reducing dose of nabumetone if tolerated.  History of Graves' disease  Antiplatelet or antithrombotic long-term use: Secondary to coronary artery disease  History of coronary artery disease  History of migraine  History of hypertension: Blood pressure is well  controlled  History of hyperlipidemia  Other fatigue - Plan: Vitamin B12  Other insomnia    Orders: Orders Placed This Encounter  Procedures  . CBC with Differential/Platelet  . COMPLETE METABOLIC PANEL WITH GFR  . Quantiferon tb gold assay (blood)  . VITAMIN D 25 Hydroxy (Vit-D Deficiency, Fractures)  . Vitamin B12  . CBC with Differential/Platelet  . COMPLETE METABOLIC PANEL WITH GFR   No orders of the defined types were placed in this encounter.   Face-to-face time spent with patient was 30 minutes. 50% of time was spent in counseling and coordination of care.  Follow-Up Instructions: Return in about 5 months (around 01/02/2018) for Rheumatoid arthritis.   Bo Merino, MD  Note - This record has been created using Editor, commissioning.  Chart creation errors have been sought, but may not always  have been located. Such creation errors do not reflect on  the standard of medical care.

## 2017-08-02 ENCOUNTER — Ambulatory Visit (INDEPENDENT_AMBULATORY_CARE_PROVIDER_SITE_OTHER): Payer: 59 | Admitting: Rheumatology

## 2017-08-02 ENCOUNTER — Ambulatory Visit (AMBULATORY_SURGERY_CENTER): Payer: 59 | Admitting: Gastroenterology

## 2017-08-02 ENCOUNTER — Encounter: Payer: Self-pay | Admitting: Gastroenterology

## 2017-08-02 ENCOUNTER — Encounter: Payer: Self-pay | Admitting: Rheumatology

## 2017-08-02 VITALS — BP 100/48 | HR 66 | Temp 96.9°F | Resp 17 | Ht 63.0 in | Wt 170.0 lb

## 2017-08-02 VITALS — BP 116/66 | HR 71 | Ht 63.0 in | Wt 169.0 lb

## 2017-08-02 DIAGNOSIS — I10 Essential (primary) hypertension: Secondary | ICD-10-CM | POA: Diagnosis not present

## 2017-08-02 DIAGNOSIS — R933 Abnormal findings on diagnostic imaging of other parts of digestive tract: Secondary | ICD-10-CM | POA: Diagnosis not present

## 2017-08-02 DIAGNOSIS — L409 Psoriasis, unspecified: Secondary | ICD-10-CM | POA: Diagnosis not present

## 2017-08-02 DIAGNOSIS — M0609 Rheumatoid arthritis without rheumatoid factor, multiple sites: Secondary | ICD-10-CM | POA: Diagnosis not present

## 2017-08-02 DIAGNOSIS — Z8669 Personal history of other diseases of the nervous system and sense organs: Secondary | ICD-10-CM

## 2017-08-02 DIAGNOSIS — Z8639 Personal history of other endocrine, nutritional and metabolic disease: Secondary | ICD-10-CM | POA: Diagnosis not present

## 2017-08-02 DIAGNOSIS — Z6830 Body mass index (BMI) 30.0-30.9, adult: Secondary | ICD-10-CM | POA: Diagnosis not present

## 2017-08-02 DIAGNOSIS — Z7902 Long term (current) use of antithrombotics/antiplatelets: Secondary | ICD-10-CM | POA: Diagnosis not present

## 2017-08-02 DIAGNOSIS — R5383 Other fatigue: Secondary | ICD-10-CM | POA: Diagnosis not present

## 2017-08-02 DIAGNOSIS — R197 Diarrhea, unspecified: Secondary | ICD-10-CM | POA: Diagnosis not present

## 2017-08-02 DIAGNOSIS — I2510 Atherosclerotic heart disease of native coronary artery without angina pectoris: Secondary | ICD-10-CM | POA: Diagnosis not present

## 2017-08-02 DIAGNOSIS — G4709 Other insomnia: Secondary | ICD-10-CM | POA: Diagnosis not present

## 2017-08-02 DIAGNOSIS — E119 Type 2 diabetes mellitus without complications: Secondary | ICD-10-CM | POA: Diagnosis not present

## 2017-08-02 DIAGNOSIS — Z8679 Personal history of other diseases of the circulatory system: Secondary | ICD-10-CM

## 2017-08-02 DIAGNOSIS — E559 Vitamin D deficiency, unspecified: Secondary | ICD-10-CM

## 2017-08-02 DIAGNOSIS — R938 Abnormal findings on diagnostic imaging of other specified body structures: Secondary | ICD-10-CM | POA: Diagnosis present

## 2017-08-02 DIAGNOSIS — M8589 Other specified disorders of bone density and structure, multiple sites: Secondary | ICD-10-CM | POA: Diagnosis not present

## 2017-08-02 DIAGNOSIS — Z79899 Other long term (current) drug therapy: Secondary | ICD-10-CM | POA: Diagnosis not present

## 2017-08-02 DIAGNOSIS — Z8719 Personal history of other diseases of the digestive system: Secondary | ICD-10-CM | POA: Diagnosis not present

## 2017-08-02 DIAGNOSIS — K219 Gastro-esophageal reflux disease without esophagitis: Secondary | ICD-10-CM

## 2017-08-02 DIAGNOSIS — R9389 Abnormal findings on diagnostic imaging of other specified body structures: Secondary | ICD-10-CM

## 2017-08-02 DIAGNOSIS — K599 Functional intestinal disorder, unspecified: Secondary | ICD-10-CM | POA: Diagnosis not present

## 2017-08-02 LAB — CBC WITH DIFFERENTIAL/PLATELET
BASOS PCT: 1 %
Basophils Absolute: 52 cells/uL (ref 0–200)
EOS ABS: 156 {cells}/uL (ref 15–500)
EOS PCT: 3 %
HCT: 38.9 % (ref 35.0–45.0)
Hemoglobin: 13.7 g/dL (ref 11.7–15.5)
Lymphocytes Relative: 29 %
Lymphs Abs: 1508 cells/uL (ref 850–3900)
MCH: 30.1 pg (ref 27.0–33.0)
MCHC: 35.2 g/dL (ref 32.0–36.0)
MCV: 85.5 fL (ref 80.0–100.0)
MONOS PCT: 12 %
MPV: 8.8 fL (ref 7.5–12.5)
Monocytes Absolute: 624 cells/uL (ref 200–950)
NEUTROS ABS: 2860 {cells}/uL (ref 1500–7800)
Neutrophils Relative %: 55 %
PLATELETS: 195 10*3/uL (ref 140–400)
RBC: 4.55 MIL/uL (ref 3.80–5.10)
RDW: 12.7 % (ref 11.0–15.0)
WBC: 5.2 10*3/uL (ref 3.8–10.8)

## 2017-08-02 MED ORDER — SODIUM CHLORIDE 0.9 % IV SOLN: 500.0000 mL | INTRAVENOUS | Status: DC

## 2017-08-02 MED ORDER — HYOSCYAMINE SULFATE 0.125 MG SL SUBL: 0.1250 mg | SUBLINGUAL_TABLET | SUBLINGUAL | 2 refills | Status: DC | PRN

## 2017-08-02 MED ORDER — PANTOPRAZOLE SODIUM 40 MG PO TBEC: 40.0000 mg | DELAYED_RELEASE_TABLET | Freq: Two times a day (BID) | ORAL | 2 refills | Status: DC

## 2017-08-02 MED FILL — OSCIMIN 0.125 MG TABLET: 0.125 | 5 days supply | Qty: 60 | Fill #0

## 2017-08-02 NOTE — Patient Instructions (Signed)
YOU HAD AN ENDOSCOPIC PROCEDURE TODAY AT [facility]:   Refer to the procedure report that was given to you for any specific questions about what was found during the examination.  If the procedure report does not answer your questions, please call your gastroenterologist to clarify.  If you requested that your care partner not be given the details of your procedure findings, then the procedure report has been included in a sealed envelope for you to review at your convenience later.  YOU SHOULD EXPECT: Some feelings of bloating in the abdomen. Passage of more gas than usual.  Walking can help get rid of the air that was put into your GI tract during the procedure and reduce the bloating. If you had a lower endoscopy (such as a colonoscopy or flexible sigmoidoscopy) you may notice spotting of blood in your stool or on the toilet paper. If you underwent a bowel prep for your procedure, you may not have a normal bowel movement for a few days.  Please Note:  You might notice some irritation and congestion in your nose or some drainage.  This is from the oxygen used during your procedure.  There is no need for concern and it should clear up in a day or so.  SYMPTOMS TO REPORT IMMEDIATELY:   Following lower endoscopy (colonoscopy or flexible sigmoidoscopy):  Excessive amounts of blood in the stool  Significant tenderness or worsening of abdominal pains  Swelling of the abdomen that is new, acute  Fever of 100F or higher   Following upper endoscopy (EGD)  Vomiting of blood or coffee ground material  New chest pain or pain under the shoulder blades  Painful or persistently difficult swallowing  New shortness of breath  Fever of 100F or higher  Black, tarry-looking stools  For urgent or emergent issues, a gastroenterologist can be reached at any hour by calling [phone].   DIET:  We do recommend a small meal at first, but then you may proceed to your regular diet.  Drink  plenty of fluids but you should avoid alcoholic beverages for 24 hours.  ACTIVITY:  You should plan to take it easy for the rest of today and you should NOT DRIVE or use heavy machinery until tomorrow (because of the sedation medicines used during the test).    FOLLOW UP: Our staff will call the number listed on your records the next business day following your procedure to check on you and address any questions or concerns that you may have regarding the information given to you following your procedure. If we do not reach you, we will leave a message.  However, if you are feeling well and you are not experiencing any problems, there is no need to return our call.  We will assume that you have returned to your regular daily activities without incident.  If any biopsies were taken you will be contacted by phone or by letter within the next 1-3 weeks.  Please call us at [phone] if you have not heard about the biopsies in 3 weeks.    SIGNATURES/CONFIDENTIALITY: You and/or your care partner have signed paperwork which will be entered into your electronic medical record.  These signatures attest to the fact that that the information above on your After Visit Summary has been reviewed and is understood.  Full responsibility of the confidentiality of this discharge information lies with you and/or your care-partner.  You have two new medications. Please, take your pantoprazole and levsin  as ordered.  Dr. Fuller Plan stated that you may resume your plavix at prior dose tomorrow.  Read all of the handouts given to you by your recovery room nurse.

## 2017-08-02 NOTE — Patient Instructions (Signed)
Standing Labs We placed an order today for your standing lab work.    Please come back and get your standing labs in November and every 3 months  We have open lab Monday through Friday from 8:30-11:30 AM and 1:30-4 PM at the office of Dr. Yanissa Michalsky.   The office is located at [address], [address], [place], [place] [address] No appointment is necessary.   Labs are drawn by [facility].  You may receive a bill from [facility] for your lab work. If you have any questions regarding directions or hours of operation,  please call [phone].    

## 2017-08-02 NOTE — Progress Notes (Signed)
  [facility] Post-op Note  Patient: [name]  Procedure(s) Performed: colonoscopy and endoscopy  Patient Location: [facility] - Recovery Area  Anesthesia Type: Deep Sedation/Propofol  Level of Consciousness: awake, oriented and patient cooperative  Airway and Oxygen Therapy: Patient Spontanous Breathing  Post-op Pain: none  Post-op Assessment:  Post-op Vital signs reviewed, Patient's Cardiovascular Status Stable, Respiratory Function Stable, Patent Airway, No signs of Nausea or vomiting and Pain level controlled  Post-op Vital Signs: Reviewed and stable  Complications: No apparent anesthesia complications  Leiland Mihelich E Marwah Disbro 3:15 PM

## 2017-08-02 NOTE — Op Note (Signed)
[facility] Patient Name: [name] Procedure Date: 08/02/2017 2:38 PM MRN: [id number] Endoscopist: Ladene Artist , MD Age: 51 Referring MD:  Date of Birth: [birth year] Gender: Female Account #: [id number] Procedure:                Upper GI endoscopy Indications:              Gastroesophageal reflux disease Medicines:                Monitored Anesthesia Care Procedure:                Pre-Anesthesia Assessment:                           - Prior to the procedure, a History and Physical                            was performed, and patient medications and                            allergies were reviewed. The patient's tolerance of                            previous anesthesia was also reviewed. The risks                            and benefits of the procedure and the sedation                            options and risks were discussed with the patient.                            All questions were answered, and informed consent                            was obtained. Prior Anticoagulants: The patient has                            taken Plavix (clopidogrel), last dose was 7 days                            prior to procedure. ASA Grade Assessment: III - A                            patient with severe systemic disease. After                            reviewing the risks and benefits, the patient was                            deemed in satisfactory condition to undergo the                            procedure.  After obtaining informed consent, the endoscope was                            passed under direct vision. Throughout the                            procedure, the patient's blood pressure, pulse, and                            oxygen saturations were monitored continuously. The                            Endoscope was introduced through the mouth, and                            advanced to the second part of duodenum. The upper                     GI endoscopy was accomplished without difficulty.                            The patient tolerated the procedure well. Scope In: Scope Out: Findings:                 LA Grade B (one or more mucosal breaks greater than                            5 mm, not extending between the tops of two mucosal                            folds) esophagitis with no bleeding was found in                            the distal esophagus.                           The exam of the esophagus was otherwise normal.                           A single localized, small non-bleeding erosion was                            found in the prepyloric region of the stomach.                            There were no stigmata of recent bleeding.                           A small hiatal hernia was present.                           The exam of the stomach was otherwise normal.                           The duodenal bulb  and second portion of the                            duodenum were normal. Complications:            No immediate complications. Estimated Blood Loss:     Estimated blood loss: none. Impression:               - LA Grade B reflux esophagitis.                           - Non-bleeding erosive gastropathy.                           - Small hiatal hernia.                           - Normal duodenal bulb and second portion of the                            duodenum.                           - No specimens collected. Recommendation:           - Patient has a contact number available for                            emergencies. The signs and symptoms of potential                            delayed complications were discussed with the                            patient. Return to normal activities tomorrow.                            Written discharge instructions were provided to the                            patient.                           - Resume previous diet.                           -  Follow antireflux measures long term.                           - Continue present medications.                           - Change pantoprazole to 40 mg po bid, 1 year of                            refills.                           -  Resume Plavix (clopidogrel) at prior dose                            tomorrow. Refer to managing physician for further                            adjustment of therapy.                           - Return to GI office in 2 months. Ladene Artist, MD 08/02/2017 3:14:49 PM This report has been signed electronically.

## 2017-08-02 NOTE — Op Note (Addendum)
[facility] Patient Name: [name] Procedure Date: 08/02/2017 2:38 PM MRN: [id number] Endoscopist: Ladene Artist , MD Age: 51 Referring MD:  Date of Birth: 1966/11/11 Gender: Female Account #: [id number] Procedure:                Colonoscopy Indications:              Clinically significant diarrhea of unexplained                            origin, Abnormal CT of the GI tract (cecum) Medicines:                Monitored Anesthesia Care Procedure:                Pre-Anesthesia Assessment:                           - Prior to the procedure, a History and Physical                            was performed, and patient medications and                            allergies were reviewed. The patient's tolerance of                            previous anesthesia was also reviewed. The risks                            and benefits of the procedure and the sedation                            options and risks were discussed with the patient.                            All questions were answered, and informed consent                            was obtained. Prior Anticoagulants: The patient has                            taken Plavix (clopidogrel), last dose was 7 days                            prior to procedure. ASA Grade Assessment: III - A                            patient with severe systemic disease. After                            reviewing the risks and benefits, the patient was                            deemed in satisfactory condition to undergo the  procedure.                           After obtaining informed consent, the colonoscope                            was passed under direct vision. Throughout the                            procedure, the patient's blood pressure, pulse, and                            oxygen saturations were monitored continuously. The                            Colonoscope was introduced through the anus and                        advanced to the the terminal ileum, with                            identification of the appendiceal orifice and IC                            valve. The terminal ileum, ileocecal valve,                            appendiceal orifice, and rectum were photographed.                            The quality of the bowel preparation was excellent.                            The colonoscopy was performed without difficulty.                            The patient tolerated the procedure well. Scope In: 2:44:20 PM Scope Out: 3:00:22 PM Scope Withdrawal Time: 0 hours 12 minutes 50 seconds  Total Procedure Duration: 0 hours 16 minutes 2 seconds  Findings:                 The perianal and digital rectal examinations were                            normal.                           The entire examined colon appeared normal on direct                            and retroflexion views. Random biopsies were                            obtained throughout the colon.  The terminal ileum appeared normal. Complications:            No immediate complications. Estimated blood loss:                            None. Estimated Blood Loss:     Estimated blood loss: none. Impression:               - The entire examined colon is normal on direct and                            retroflexion views.                           - The examined portion of the ileum was normal.                           - No specimens collected. Recommendation:           - Repeat colonoscopy in 10 years for screening                            purposes.                           - Resume Plavix (clopidogrel) tomorrow at prior                            dose. Refer to managing physician for further                            adjustment of therapy.                           - Patient has a contact number available for                            emergencies. The signs and symptoms of potential                             delayed complications were discussed with the                            patient. Return to normal activities tomorrow.                            Written discharge instructions were provided to the                            patient.                           - Resume previous diet.                           - Continue present medications.                           -  Await pathology results. Ladene Artist, MD 08/02/2017 3:10:46 PM This report has been signed electronically.

## 2017-08-02 NOTE — Progress Notes (Signed)
Called to room to assist during endoscopic procedure.  Patient ID and intended procedure confirmed with present staff. Received instructions for my participation in the procedure from the performing physician.  

## 2017-08-03 LAB — COMPLETE METABOLIC PANEL WITH GFR
ALT: 13 U/L (ref 6–29)
AST: 18 U/L (ref 10–35)
Albumin: 4.4 g/dL (ref 3.6–5.1)
Alkaline Phosphatase: 70 U/L (ref 33–115)
BILIRUBIN TOTAL: 0.5 mg/dL (ref 0.2–1.2)
BUN: 6 mg/dL — AB (ref 7–25)
CO2: 23 mmol/L (ref 20–31)
CREATININE: 0.73 mg/dL (ref 0.50–1.10)
Calcium: 8.9 mg/dL (ref 8.6–10.2)
Chloride: 100 mmol/L (ref 98–110)
GFR, Est African American: >89 mL/min (ref >=60)
GFR, Est Non African American: >89 mL/min (ref >=60)
GLUCOSE: 109 mg/dL — AB (ref 65–99)
Potassium: 3.6 mmol/L (ref 3.5–5.3)
SODIUM: 136 mmol/L (ref 135–146)
TOTAL PROTEIN: 6.6 g/dL (ref 6.1–8.1)

## 2017-08-03 LAB — VITAMIN B12: VITAMIN B 12: 543 pg/mL (ref 200–1100)

## 2017-08-03 LAB — VITAMIN D 25 HYDROXY (VIT D DEFICIENCY, FRACTURES): Vit D, 25-Hydroxy: 36 ng/mL (ref 30–100)

## 2017-08-04 LAB — QUANTIFERON TB GOLD ASSAY (BLOOD)
Interferon Gamma Release Assay: NEGATIVE
Mitogen-Nil: >10 IU/mL
Quantiferon Nil Value: 0.01 IU/mL
Quantiferon Tb Ag Minus Nil Value: 0.01 IU/mL

## 2017-08-04 NOTE — Progress Notes (Signed)
WNL. Advise Vit D 2000 U qd

## 2017-08-05 ENCOUNTER — Telehealth: Payer: Self-pay

## 2017-08-05 NOTE — Telephone Encounter (Signed)
  Follow up Call-  Call back number 08/02/2017  Post procedure Call Back phone  # [phone]  Permission to leave phone message Yes  Some recent data might be hidden     Patient questions:  Do you have a fever, pain , or abdominal swelling? No. Pain Score  0 *  Have you tolerated food without any problems? Yes.    Have you been able to return to your normal activities? Yes.    Do you have any questions about your discharge instructions: Diet   No. Medications  No. Follow up visit  No.  Do you have questions or concerns about your Care? No.  Actions: * If pain score is 4 or above: No action needed, pain <4.  No problems noted per pt. maw

## 2017-08-06 MED FILL — CONTOUR NEXT STRIPS: 34 days supply | Qty: 200 | Fill #1

## 2017-08-06 MED FILL — HumaLOG 100 UNIT/ML SOLN: 100 | 89 days supply | Qty: 80 | Fill #1

## 2017-08-11 ENCOUNTER — Encounter: Payer: Self-pay | Admitting: Gastroenterology

## 2017-08-13 MED FILL — SIMPONI 100 MG/ML SOAJ: 100 | 28 days supply | Qty: 1 | Fill #1

## 2017-08-14 ENCOUNTER — Other Ambulatory Visit: Payer: Self-pay | Admitting: Physician Assistant

## 2017-08-14 ENCOUNTER — Other Ambulatory Visit: Payer: Self-pay | Admitting: Rheumatology

## 2017-08-14 MED FILL — LEFLUNOMIDE 20 MG TABLET: 20 | 90 days supply | Qty: 90 | Fill #0

## 2017-08-14 MED FILL — TOPIRAMATE 100 MG TABLET: 100 | 30 days supply | Qty: 60 | Fill #0

## 2017-08-14 NOTE — Telephone Encounter (Signed)
08/02/17 last visit 01/13/18 next visit   CBC Latest Ref Rng & Units 08/02/2017 05/22/2017 05/02/2017  WBC 3.8 - 10.8 K/uL 5.2 6.5 6.2  Hemoglobin 11.7 - 15.5 g/dL 13.7 11.9 13.2  Hematocrit 35.0 - 45.0 % 38.9 34.9(L) 39.9  Platelets 140 - 400 K/uL 195 172 213     CMP Latest Ref Rng & Units 08/02/2017 05/22/2017 01/01/2017  Glucose 65 - 99 mg/dL 109(H) 240(H) 104(H)  BUN 7 - 25 mg/dL 6(L) 10 10  Creatinine 0.50 - 1.10 mg/dL 0.73 0.89 0.65  Sodium 135 - 146 mmol/L 136 142 139  Potassium 3.5 - 5.3 mmol/L 3.6 4.2 4.6  Chloride 98 - 110 mmol/L 100 108 106  CO2 20 - 31 mmol/L 23 23 27   Calcium 8.6 - 10.2 mg/dL 8.9 9.3 9.3  Total Protein 6.1 - 8.1 g/dL 6.6 6.5 6.4  Total Bilirubin 0.2 - 1.2 mg/dL 0.5 0.6 0.4  Alkaline Phos 33 - 115 U/L 70 75 70  AST 10 - 35 U/L 18 29 13   ALT 6 - 29 U/L 13 29 11    Ok to refill per Dr Estanislado Pandy

## 2017-08-26 DIAGNOSIS — E1039 Type 1 diabetes mellitus with other diabetic ophthalmic complication: Secondary | ICD-10-CM | POA: Diagnosis not present

## 2017-09-05 ENCOUNTER — Other Ambulatory Visit: Payer: Self-pay | Admitting: Rheumatology

## 2017-09-05 ENCOUNTER — Other Ambulatory Visit: Payer: Self-pay | Admitting: Pharmacist

## 2017-09-05 MED ORDER — GOLIMUMAB 100 MG/ML ~~LOC~~ SOAJ: SUBCUTANEOUS | 1 refills | Status: DC

## 2017-09-05 MED FILL — MONTELUKAST SOD 10 MG TAB: 10 | 30 days supply | Qty: 30 | Fill #4

## 2017-09-05 MED FILL — CYCLOBENZAPRINE 10 MG TAB: 10 | 30 days supply | Qty: 30 | Fill #1

## 2017-09-05 MED FILL — NABUMETONE 750 MG TABLET: 750 | 30 days supply | Qty: 60 | Fill #0

## 2017-09-05 MED FILL — SIMPONI 100 MG/ML SOAJ: 100 | 30 days supply | Qty: 1 | Fill #0

## 2017-09-05 NOTE — Telephone Encounter (Signed)
ok 

## 2017-09-05 NOTE — Telephone Encounter (Signed)
08/02/17 last visit 01/13/18 next visit Labs: 08/02/17  WNL TB Gold: 08/02/17 Neg  Okay to refill per Dr. Estanislado Pandy

## 2017-09-05 NOTE — Telephone Encounter (Signed)
08/02/17 last visit 01/13/18 next visit   Okay to refill Nabumetone?

## 2017-09-12 ENCOUNTER — Other Ambulatory Visit: Payer: Self-pay | Admitting: *Deleted

## 2017-09-12 ENCOUNTER — Telehealth: Payer: Self-pay | Admitting: *Deleted

## 2017-09-12 MED ORDER — ISOSORBIDE MONONITRATE ER 30 MG PO TB24: 30.0000 mg | ORAL_TABLET | Freq: Every day | ORAL | 3 refills | Status: DC

## 2017-09-12 MED FILL — ISOSORBIDE MN ER 30 MG TAB: 30 | 90 days supply | Qty: 90 | Fill #0

## 2017-09-12 NOTE — Telephone Encounter (Signed)
Med list updated, rx sent, patient aware.

## 2017-09-12 NOTE — Telephone Encounter (Signed)
Patient called and stated that her PCP increased her isosorbide dose to 30 mg qd. An rx was sent in at her recent office visit here on 07/11/17 for 15 mg qd. She states that Dr Burt Knack is aware of the med change but there is no mention of this on her recent office visit. Okay to update med list and refill for 30 mg qd as reported by the patient? Please advise. Thanks, MI

## 2017-09-12 NOTE — Telephone Encounter (Signed)
Yes- this is fine; thanks- 

## 2017-09-19 ENCOUNTER — Ambulatory Visit (INDEPENDENT_AMBULATORY_CARE_PROVIDER_SITE_OTHER): Payer: 59 | Admitting: Family Medicine

## 2017-09-19 ENCOUNTER — Encounter: Payer: Self-pay | Admitting: Family Medicine

## 2017-09-19 VITALS — BP 127/74 | HR 82 | Temp 98.2°F | Wt 171.0 lb

## 2017-09-19 DIAGNOSIS — K219 Gastro-esophageal reflux disease without esophagitis: Secondary | ICD-10-CM | POA: Diagnosis not present

## 2017-09-19 DIAGNOSIS — R197 Diarrhea, unspecified: Secondary | ICD-10-CM

## 2017-09-19 DIAGNOSIS — G4700 Insomnia, unspecified: Secondary | ICD-10-CM | POA: Diagnosis not present

## 2017-09-19 DIAGNOSIS — Z794 Long term (current) use of insulin: Secondary | ICD-10-CM | POA: Diagnosis not present

## 2017-09-19 DIAGNOSIS — E119 Type 2 diabetes mellitus without complications: Secondary | ICD-10-CM | POA: Diagnosis not present

## 2017-09-19 DIAGNOSIS — IMO0001 Reserved for inherently not codable concepts without codable children: Secondary | ICD-10-CM

## 2017-09-19 MED ORDER — TOPIRAMATE 100 MG PO TABS: 100.0000 mg | ORAL_TABLET | Freq: Two times a day (BID) | ORAL | 3 refills | Status: DC

## 2017-09-19 MED ORDER — SUVOREXANT 10 MG PO TABS: 1.0000 | ORAL_TABLET | Freq: Every day | ORAL | 1 refills | Status: DC

## 2017-09-19 MED ORDER — HYOSCYAMINE SULFATE 0.125 MG SL SUBL: 0.1250 mg | SUBLINGUAL_TABLET | SUBLINGUAL | 2 refills | Status: DC | PRN

## 2017-09-19 MED FILL — TOPIRAMATE 100 MG TABLET: 100 | 30 days supply | Qty: 60 | Fill #0

## 2017-09-19 MED FILL — OSCIMIN SL 0.125 MG TABLET: 0.125 | 10 days supply | Qty: 60 | Fill #0

## 2017-09-19 NOTE — Progress Notes (Signed)
[name] is a 51 y.o. female who presents to [place]: [place] today for fatigue, diabetes, and knee pain.  Patient reports fatigue for the last 4 weeks which was worse this weekend. Patient also reports increased diarrhea and worsening insomnia during this time. Patient reports that difficulty sleeping is mostly due to stomach pain or needing to have a bowel movement. Patient typically goes to sleep around 9 PM and wakes up around 3 AM. She endorses a history of snoring. She has previously tried trazodone and ambien but did not find either medication helpful. Patient has not used reports not taking hyoscyamine lately.   Diabetes: Patient also reports increased fluctuation of blood sugars. She ranges anywhere from 40s to 300s. Patient reports not eating much due to job pressures. She expects to get a new glucose monitor in December. Patient is scheduled to follow-up with her Endocrinologist next month.   Knee pain: patient reports return of knee pain since receiving an injection 2 months ago. She states that the pain is only beginning to return and is willing to discuss this further at a future visit.    Past Medical History:  Diagnosis Date  . ACUT MI ANTEROLAT WALL SUBSQT EPIS CARE   . Acute maxillary sinusitis   . ALLERGIC RHINITIS, SEASONAL   . ARTHRITIS, RHEUMATOID   . Atrial fibrillation ([place])    a. after CABG.  . CAD, ARTERY BYPASS GRAFT    a. DES to RCA in 2010 then LAD occlusion s/p CABG 3 06/07/2009 with LIMA to LAD, reverse SVG to D1, reverse SVG to distal RCA. b. Cath 05/08/2016 slightly hypodense region in the intermediate branch, however she had excellent flow, FFR was normal. Vein graft to PDA and the posterolateral branch is patent, patent LIMA to LAD, occluded SVG to diagonal.  . CARPAL TUNNEL SYNDROME, BILATERAL   . CHOLELITHIASIS   . Contrast media allergy    . DERMATITIS, ALLERGIC   . DIABETES MELLITUS, TYPE I    on insulin pump  . DIABETIC  RETINOPATHY   . HYPERLIPIDEMIA-MIXED   . HYPERTHYROIDISM   . Insulin pump in place   . MIGRAINE W/O AURA W/INTRACT W/STATUS MIGRAINOSUS 02/19/2008  . SINUS TACHYCARDIA 11/08/2010  . TRIGGER FINGER   . URI    Past Surgical History:  Procedure Laterality Date  . ABDOMINAL HYSTERECTOMY    . Caesarean section    . CARDIAC CATHETERIZATION N/A 05/08/2016   Procedure: Left Heart Cath and Cors/Grafts Angiography;  Surgeon: Burnell Blanks, MD;  Location: [facility];  Service: Cardiovascular;  Laterality: N/A;  . CARPAL TUNNEL RELEASE    . CHOLECYSTECTOMY    . CORONARY ARTERY BYPASS GRAFT    . VITRECTOMY     Social History  Substance Use Topics  . Smoking status: Never Smoker  . Smokeless tobacco: Never Used  . Alcohol use Yes     Comment: rarely 1 every 6 months   family history includes Depression in her unknown relative; Diabetes in her unknown relative; Heart disease in her unknown relative; Hyperlipidemia in her unknown relative; Hypertension in her unknown relative; Migraines in her unknown relative; Stroke in her paternal grandmother.  ROS as above:  Medications: Current Outpatient Prescriptions  Medication Sig Dispense Refill  . aspirin 81 MG tablet Take 81 mg by mouth daily.     . calcipotriene-betamethasone (TACLONEX SCALP) external suspension Apply daily to affected areas    . clopidogrel (  PLAVIX) 75 MG tablet Take 1 tablet (75 mg total) by mouth daily. 90 tablet 3  . cyanocobalamin (,VITAMIN B-12,) 1000 MCG/ML injection Inject 1 mL (1,000 mcg total) into the muscle every 30 (thirty) days. 6 mL 0  . cyclobenzaprine (FLEXERIL) 10 MG tablet TAKE 1 TABLET (10 MG TOTAL) BY MOUTH AT BEDTIME. 30 tablet 5  . EPINEPHrine 0.15 MG/0.15ML IJ injection Inject 0.15 mLs (0.15 mg total) into the skin as needed. Inject as directed. 2 Device 1  . folic acid (FOLVITE) 1 MG tablet TAKE TWO  TABLETS BY MOUTH DAILY 180 tablet 3  . furosemide (LASIX) 40 MG tablet Take 40 mg by mouth daily as needed for edema (When she feels like she is swelling).    . Golimumab (SIMPONI) 100 MG/ML SOAJ INJECT ONE DEVICE UNDER THE SKIN ONCE MONTHLY 1 mL 1  . hydroxychloroquine (PLAQUENIL) 200 MG tablet TAKE 1 TABLET BY MOUTH EVERY MORNING AND 1/2 TABLET EVERY EVENING 135 tablet 1  . hyoscyamine (LEVSIN SL) 0.125 MG SL tablet Place 1 tablet (0.125 mg total) under the tongue every 4 (four) hours as needed for cramping. 60 tablet 2  . insulin lispro (HUMALOG) 100 UNIT/ML injection FOR USE IN INSULIN PUMP. TOTAL DAILY INSULIN DOSE = UP TO 90 UNITS.    Marland Kitchen isosorbide mononitrate (IMDUR) 30 MG 24 hr tablet Take 1 tablet (30 mg total) by mouth daily. 90 tablet 3  . leflunomide (ARAVA) 20 MG tablet TAKE 1 TABLET BY MOUTH DAILY 90 tablet 0  . losartan (COZAAR) 50 MG tablet Take 1 tablet (50 mg total) by mouth daily. 90 tablet 3  . metoprolol tartrate (LOPRESSOR) 25 MG tablet Take 1 tablet (25 mg total) by mouth 2 (two) times daily. 180 tablet 3  . montelukast (SINGULAIR) 10 MG tablet Take 1 tablet (10 mg total) by mouth at bedtime. 30 tablet 5  . nabumetone (RELAFEN) 750 MG tablet TAKE 1 TABLET BY MOUTH TWICE DAILY 60 tablet 3  . Omega-3 Fatty Acids (FISH OIL) 1200 MG CAPS Take 1,200 mg by mouth 2 (two) times daily.    . ONE TOUCH ULTRA TEST test strip   1  . pantoprazole (PROTONIX) 40 MG tablet Take 1 tablet (40 mg total) by mouth 2 (two) times daily. 180 tablet 2  . potassium chloride (K-DUR) 10 MEQ tablet Take 1 tablet (10 mEq total) by mouth daily. 90 tablet 3  . ranitidine (ZANTAC) 150 MG tablet Take 1 tablet (150 mg total) by mouth at bedtime. 30 tablet 5  . rosuvastatin (CRESTOR) 10 MG tablet Take 1 tablet (10 mg total) by mouth daily. 90 tablet 3  . Suvorexant (BELSOMRA) 10 MG TABS Take 1 tablet by mouth at bedtime. 30 tablet 1  . Syringe/Needle, Disp, (SYRINGE 3CC/23GX1") 23G X 1" 3 ML MISC Use to inject  Vit B12 monthly. 50 each 0  . topiramate (TOPAMAX) 100 MG tablet Take 1 tablet (100 mg total) by mouth 2 (two) times daily. 60 tablet 3   Current Facility-Administered Medications  Medication Dose Route Frequency Provider Last Rate Last Dose  . 0.9 %  sodium chloride infusion  500 mL Intravenous Continuous Ladene Artist, MD       Allergies  Allergen Reactions  . Prochlorperazine Rash    Neuro problems per pt Neurological reaction  . Ramipril Swelling, Rash and Other (See Comments)  . Shellfish-Derived Products Swelling    Shrimp(Facial swelling) Shrimp(Facial swelling)  . Atorvastatin Rash    Elevated LFT's Elevated LFT's Elevated  LFT's  . Compazine  [Prochlorperazine Edisylate] Other (See Comments)    Neurological reaction  . Etanercept Swelling and Rash  . Infliximab Rash  . Iohexol     Iv contrast dye -rash all over  . Orencia [Abatacept] Rash  . Shellfish Allergy Swelling    Shrimp(Facial swelling)  . Tofacitinib Rash and Other (See Comments)    Severe abdominal pain Severe abdominal pain Severe abdominal pain  . Prochlorperazine Edisylate     unknown  . Amiodarone Nausea Only  . Rituximab Rash    Causes a rash    Health Maintenance Health Maintenance  Topic Date Due  . OPHTHALMOLOGY EXAM  10/30/1977  . HIV Screening  10/30/1982  . PAP SMEAR  05/31/2008  . PNEUMOCOCCAL POLYSACCHARIDE VACCINE (2) 10/16/2011  . TETANUS/TDAP  09/30/2013  . HEMOGLOBIN A1C  03/25/2017  . INFLUENZA VACCINE  09/19/2018 (Originally 07/31/2017)  . FOOT EXAM  08/02/2018     Exam:  BP 127/74   Pulse 82   Temp 98.2 F (36.8 C) (Oral)   Wt 171 lb (77.6 kg)   BMI 30.29 kg/m  Gen: Well, no acute distress, sitting comfortably in chair HEENT: EOMI,  MMM Lungs: Normal work of breathing. CTABL Heart: RRR, normal S1 and S2, no MRG Abd: NABS, Soft. Nondistended, Nontender Exts: Brisk capillary refill, warm and well perfused.    No results found for this or any previous visit  (from the past 72 hour(s)). No results found.    Assessment and Plan: 51 y.o. female with fatigue, diabetes, and knee pain. Her fatigue is most likely due to a lack of adequate sleep. Patient will undergo a sleep study to look for any sleep apnea or other causes that could be disrupting her sleep. She will also start Belsomra. Patient was instructed to restart hyoscyamine and also take this at night.   Diabetes: Patient will continue to monitor blood sugar levels regularly and follow-up with Endocrinologist next month to discuss additional treatment options.  Knee pain: It has only been 2 months since previous injection. Patient to return in 4 weeks to further discuss knee pain and possibly have another injection if indicated.   Orders Placed This Encounter  Procedures  . Nocturnal polysomnography (NPSG)    Standing Status:   Future    Standing Expiration Date:   09/19/2018    Order Specific Question:   Where should this test be performed:    Answer:   [facility] ordered this encounter  Medications  . hyoscyamine (LEVSIN SL) 0.125 MG SL tablet    Sig: Place 1 tablet (0.125 mg total) under the tongue every 4 (four) hours as needed for cramping.    Dispense:  60 tablet    Refill:  2  . topiramate (TOPAMAX) 100 MG tablet    Sig: Take 1 tablet (100 mg total) by mouth 2 (two) times daily.    Dispense:  60 tablet    Refill:  3  . Suvorexant (BELSOMRA) 10 MG TABS    Sig: Take 1 tablet by mouth at bedtime.    Dispense:  30 tablet    Refill:  1     Discussed warning signs or symptoms. Please see discharge instructions. Patient expresses understanding.

## 2017-09-19 NOTE — Patient Instructions (Addendum)
Thank you for coming in today. Take hyocyasine for spasm as needed especially at night.  Use belsomra at bedtime as needed for insomnia.  We will arrange for a sleep study.  Let me know if you never hear about it again.  Recheck with Luvenia Starch, Endocrinology and Gastroenterology.   We are slowly trying to figure this out.    Suvorexant oral tablets What is this medicine? SUVOREXANT (su-vor-EX-ant) is used to treat insomnia. This medicine helps you to fall asleep and sleep through the night. This medicine may be used for other purposes; ask your health care provider or pharmacist if you have questions. COMMON BRAND NAME(S): Belsomra What should I tell my health care provider before I take this medicine? They need to know if you have any of these conditions: -depression -history of a drug or alcohol abuse problem -history of daytime sleepiness -history of sudden onset of muscle weakness (cataplexy) -liver disease -lung or breathing disease -narcolepsy -suicidal thoughts, plans, or attempt; a previous suicide attempt by you or a family member -an unusual or allergic reaction to suvorexant, other medicines, foods, dyes, or preservatives -pregnant or trying to get pregnant -breast-feeding How should I use this medicine? Take this medicine by mouth within 30 minutes of going to bed. Do not take it unless you are able to stay in bed a full night before you must be active again. Follow the directions on the prescription label. For best results, it is better to take this medicine on an empty stomach. Do not take your medicine more often than directed. Do not stop taking this medicine on your own. Always follow your doctor or health care professional's advice. A special MedGuide will be given to you by the pharmacist with each prescription and refill. Be sure to read this information carefully each time. Talk to your pediatrician regarding the use of this medicine in children. Special care may be  needed. Overdosage: If you think you have taken too much of this medicine contact a poison control center or emergency room at once. NOTE: This medicine is only for you. Do not share this medicine with others. What if I miss a dose? This medicine should only be taken immediately before going to sleep. Do not take double or extra doses. What may interact with this medicine? -alcohol -antiviral medicines for HIV or AIDS -aprepitant -carbamazepine -certain antibiotics like ciprofloxacin, clarithromycin, erythromycin, telithromycin -certain medicines for depression or psychotic disturbances -certain medicines for fungal infections like ketoconazole, posaconazole, fluconazole, or itraconazole -conivaptan -digoxin -diltiazem -grapefruit juice -imatinib -medicines for anxiety or sleep -phenytoin -rifampin -verapamil This list may not describe all possible interactions. Give your health care provider a list of all the medicines, herbs, non-prescription drugs, or dietary supplements you use. Also tell them if you smoke, drink alcohol, or use illegal drugs. Some items may interact with your medicine. What should I watch for while using this medicine? Visit your doctor or health care professional for regular checks on your progress. Keep a regular sleep schedule by going to bed at about the same time each night. Avoid caffeine-containing drinks in the evening hours. When sleep medicines are used every night for more than a few weeks, they may stop working. Do not increase the dose on your own. Talk to your doctor if your insomnia worsens or is not better within 7 to 10 days. After taking this medicine for sleep, you may get up out of bed while not being fully awake and do an activity that you  do not know you are doing. The next morning, you may have no memory of the event. Activities such as driving a car ("sleep-driving"), making and eating food, talking on the phone, sexual activity, and  sleep-walking have been reported. Call your doctor right away if you find out you have done any of these activities. Do not take this medicine if you have used alcohol that evening or before bed or taken another medicine for sleep, since your risk of doing these sleep-related activities will be increased. Do not take this medicine unless you are able to stay in bed for a full night (7 to 8 hours) and do not drive or perform other activities requiring full alertness within 8 hours of a dose. Do not drive, use machinery, or do anything that needs mental alertness the day after you take the 20 mg dose of this medicine. The use of lower doses (10 mg) also has the potential to cause driving impairment the next day. You may have a decrease in mental alertness the day after use, even if you feel that you are fully awake. Tell your doctor if you will need to perform activities requiring full alertness, such as driving, the next day. Do not stand or sit up quickly after taking this medicine, especially if you are an older patient. This reduces the risk of dizzy or fainting spells. If you or your family notice any changes in your behavior, such as new or worsening depression, thoughts of harming yourself, anxiety, other unusual or disturbing thoughts, or memory loss, call your doctor right away. What side effects may I notice from receiving this medicine? Side effects that you should report to your doctor or health care professional as soon as possible: -allergic reactions like skin rash, itching or hives, swelling of the face, lips, or tongue -confusion -depressed mood -feeling faint or lightheaded, falls -hallucinations -inability to move or speak for up to several minutes while you are going to sleep or waking up -memory loss -periods of leg weakness lasting from seconds to a few minutes -problems with balance, speaking, walking -restlessness, excitability, or feelings of agitation -unusual activities while  asleep like driving, eating, making phone calls Side effects that usually do not require medical attention (report to your doctor or health care professional if they continue or are bothersome): -abnormal dreams -daytime drowsiness -diarrhea -dizziness -headache This list may not describe all possible side effects. Call your doctor for medical advice about side effects. You may report side effects to FDA at 1-800-FDA-1088. Where should I keep my medicine? Keep out of the reach of children. This medicine can be abused. Keep your medicine in a safe place to protect it from theft. Do not share this medicine with anyone. Selling or giving away this medicine is dangerous and against the law. Store at room temperature between 15 and 30 degrees C (59 and 86 degrees F). Throw away any unused medicine after the expiration date. NOTE: This sheet is a summary. It may not cover all possible information. If you have questions about this medicine, talk to your doctor, pharmacist, or health care provider.  2018 [employer]vier/Gold Standard (2016-01-19 11:54:49)

## 2017-09-30 ENCOUNTER — Ambulatory Visit (INDEPENDENT_AMBULATORY_CARE_PROVIDER_SITE_OTHER): Payer: 59 | Admitting: Family Medicine

## 2017-09-30 ENCOUNTER — Encounter: Payer: Self-pay | Admitting: Family Medicine

## 2017-09-30 VITALS — BP 112/69 | HR 66 | Wt 169.0 lb

## 2017-09-30 DIAGNOSIS — M25561 Pain in right knee: Secondary | ICD-10-CM

## 2017-09-30 DIAGNOSIS — M26622 Arthralgia of left temporomandibular joint: Secondary | ICD-10-CM | POA: Diagnosis not present

## 2017-09-30 NOTE — Patient Instructions (Signed)
Thank you for coming in today. We may consider Pennsaid 2% topical.  Let me know if you want to try.  Recheck with me as needed.  Consider an OTC bite guard for TMJ.

## 2017-09-30 NOTE — Progress Notes (Signed)
[name] is a 51 y.o. female who presents to [facility] today for  Right knee pain. Patient notes worsening right knee pin. She was seen about 3 months  Ago for knee pain. Sh received a steroid injection which worked until recently. She would like a repeat injecton today if possible.   She also notes left ear pain. She notes that she grinds her teeth at night and notes a personal history of TMJ pain. She suspects the pain in her left ear is due to TMJ but also notes some ear congestion and is worried about a potential ear infection. In the past she had a bite guard that eventually wore out. A replacement bite guard is too expensive at this time for her.   Past Medical History:  Diagnosis Date  . ACUT MI ANTEROLAT WALL SUBSQT EPIS CARE   . Acute maxillary sinusitis   . ALLERGIC RHINITIS, SEASONAL   . ARTHRITIS, RHEUMATOID   . Atrial fibrillation ([place])    a. after CABG.  . CAD, ARTERY BYPASS GRAFT    a. DES to RCA in 2010 then LAD occlusion s/p CABG 3 06/07/2009 with LIMA to LAD, reverse SVG to D1, reverse SVG to distal RCA. b. Cath 05/08/2016 slightly hypodense region in the intermediate branch, however she had excellent flow, FFR was normal. Vein graft to PDA and the posterolateral branch is patent, patent LIMA to LAD, occluded SVG to diagonal.  . CARPAL TUNNEL SYNDROME, BILATERAL   . CHOLELITHIASIS   . Contrast media allergy   . DERMATITIS, ALLERGIC   . DIABETES MELLITUS, TYPE I    on insulin pump  . DIABETIC  RETINOPATHY   . Hiatal hernia   . HYPERLIPIDEMIA-MIXED   . HYPERTHYROIDISM   . Insulin pump in place   . MIGRAINE W/O AURA W/INTRACT W/STATUS MIGRAINOSUS 02/19/2008  . SINUS TACHYCARDIA 11/08/2010  . TRIGGER FINGER   . URI    Past Surgical History:  Procedure Laterality Date  . ABDOMINAL HYSTERECTOMY    . Caesarean section    . CARDIAC CATHETERIZATION N/A 05/08/2016   Procedure: Left Heart Cath and Cors/Grafts Angiography;   Surgeon: Burnell Blanks, MD;  Location: [facility];  Service: Cardiovascular;  Laterality: N/A;  . CARPAL TUNNEL RELEASE    . CHOLECYSTECTOMY    . CORONARY ARTERY BYPASS GRAFT    . VITRECTOMY     Social History  Substance Use Topics  . Smoking status: Never Smoker  . Smokeless tobacco: Never Used  . Alcohol use Yes     Comment: rarely 1 every 6 months     ROS:  As above   Medications: Current Outpatient Prescriptions  Medication Sig Dispense Refill  . aspirin 81 MG tablet Take 81 mg by mouth daily.     . calcipotriene-betamethasone (TACLONEX SCALP) external suspension Apply daily to affected areas    . clopidogrel (PLAVIX) 75 MG tablet Take 1 tablet (75 mg total) by mouth daily. 90 tablet 3  . cyanocobalamin (,VITAMIN B-12,) 1000 MCG/ML injection Inject 1 mL (1,000 mcg total) into the muscle every 30 (thirty) days. 6 mL 0  . cyclobenzaprine (FLEXERIL) 10 MG tablet TAKE 1 TABLET (10 MG TOTAL) BY MOUTH AT BEDTIME. 30 tablet 5  . EPINEPHrine 0.15 MG/0.15ML IJ injection Inject 0.15 mLs (0.15 mg total) into the skin as needed. Inject as directed. 2 Device 1  . folic acid (FOLVITE) 1 MG tablet TAKE TWO TABLETS BY MOUTH DAILY 180 tablet  3  . furosemide (LASIX) 40 MG tablet Take 40 mg by mouth daily as needed for edema (When she feels like she is swelling).    . Golimumab (SIMPONI) 100 MG/ML SOAJ INJECT ONE DEVICE UNDER THE SKIN ONCE MONTHLY 1 mL 1  . hydroxychloroquine (PLAQUENIL) 200 MG tablet TAKE 1 TABLET BY MOUTH EVERY MORNING AND 1/2 TABLET EVERY EVENING 135 tablet 1  . hyoscyamine (LEVSIN SL) 0.125 MG SL tablet Place 1 tablet (0.125 mg total) under the tongue every 4 (four) hours as needed for cramping. 60 tablet 2  . insulin lispro (HUMALOG) 100 UNIT/ML injection FOR USE IN INSULIN PUMP. TOTAL DAILY INSULIN DOSE = UP TO 90 UNITS.    Marland Kitchen isosorbide mononitrate (IMDUR) 30 MG 24 hr tablet Take 1 tablet (30 mg total) by mouth daily. 90 tablet 3  . leflunomide (ARAVA) 20  MG tablet TAKE 1 TABLET BY MOUTH DAILY 90 tablet 0  . losartan (COZAAR) 50 MG tablet Take 1 tablet (50 mg total) by mouth daily. 90 tablet 3  . metoprolol tartrate (LOPRESSOR) 25 MG tablet Take 1 tablet (25 mg total) by mouth 2 (two) times daily. 180 tablet 3  . montelukast (SINGULAIR) 10 MG tablet Take 1 tablet (10 mg total) by mouth at bedtime. 30 tablet 5  . nabumetone (RELAFEN) 750 MG tablet TAKE 1 TABLET BY MOUTH TWICE DAILY 60 tablet 3  . Omega-3 Fatty Acids (FISH OIL) 1200 MG CAPS Take 1,200 mg by mouth 2 (two) times daily.    . ONE TOUCH ULTRA TEST test strip   1  . pantoprazole (PROTONIX) 40 MG tablet Take 1 tablet (40 mg total) by mouth 2 (two) times daily. 180 tablet 2  . potassium chloride (K-DUR) 10 MEQ tablet Take 1 tablet (10 mEq total) by mouth daily. 90 tablet 3  . ranitidine (ZANTAC) 150 MG tablet Take 1 tablet (150 mg total) by mouth at bedtime. 30 tablet 5  . rosuvastatin (CRESTOR) 10 MG tablet Take 1 tablet (10 mg total) by mouth daily. 90 tablet 3  . Suvorexant (BELSOMRA) 10 MG TABS Take 1 tablet by mouth at bedtime. 30 tablet 1  . Syringe/Needle, Disp, (SYRINGE 3CC/23GX1") 23G X 1" 3 ML MISC Use to inject Vit B12 monthly. 50 each 0  . topiramate (TOPAMAX) 100 MG tablet Take 1 tablet (100 mg total) by mouth 2 (two) times daily. 60 tablet 3   Current Facility-Administered Medications  Medication Dose Route Frequency Provider Last Rate Last Dose  . 0.9 %  sodium chloride infusion  500 mL Intravenous Continuous Ladene Artist, MD       Allergies  Allergen Reactions  . Prochlorperazine Rash    Neuro problems per pt Neurological reaction  . Ramipril Swelling, Rash and Other (See Comments)  . Shellfish-Derived Products Swelling    Shrimp(Facial swelling) Shrimp(Facial swelling)  . Atorvastatin Rash    Elevated LFT's Elevated LFT's Elevated LFT's  . Compazine  [Prochlorperazine Edisylate] Other (See Comments)    Neurological reaction  . Etanercept Swelling and Rash    . Infliximab Rash  . Iohexol     Iv contrast dye -rash all over  . Orencia [Abatacept] Rash  . Shellfish Allergy Swelling    Shrimp(Facial swelling)  . Tofacitinib Rash and Other (See Comments)    Severe abdominal pain Severe abdominal pain Severe abdominal pain  . Prochlorperazine Edisylate     unknown  . Amiodarone Nausea Only  . Rituximab Rash    Causes a rash  Exam:  BP 112/69   Pulse 66   Wt 169 lb (76.7 kg)   BMI 29.94 kg/m  General: Well Developed, well nourished, and in no acute distress.  Neuro/Psych: Alert and oriented x3, extra-ocular muscles intact, able to move all 4 extremities, sensation grossly intact. HEENT: Bilateral tympanic membranes are nonerythematous however there is a slight effusion with bubbles behind both eardrums. The left TMJ is tender to palpation Skin: Warm and dry, no rashes noted.  Respiratory: Not using accessory muscles, speaking in full sentences, trachea midline.  Cardiovascular: Pulses palpable, no extremity edema. Abdomen: Does not appear distended. MSK:  Right knee moderate effusion. Diffusely tender normal motion crepitations with extension. Intact ligament exam.   Procedure: Real-time Ultrasound Guided Injection of right knee  Device: GE Logiq E  Images permanently stored and available for review in the ultrasound unit. Verbal informed consent obtained. Discussed risks and benefits of procedure. Warned about infection bleeding damage to structures skin hypopigmentation and fat atrophy among others. Patient expresses understanding and agreement Time-out conducted.  Noted no overlying erythema, induration, or other signs of local infection.  Skin prepped in a sterile fashion.  Local anesthesia: Topical Ethyl chloride.  With sterile technique and under real time ultrasound guidance: 80 mg of Kenalog and 4 mL of Marcaine injected easily.  Completed without difficulty  Pain immediately resolved suggesting accurate  placement of the medication.  Advised to call if fevers/chills, erythema, induration, drainage, or persistent bleeding.  Images permanently stored and available for review in the ultrasound unit.  Impression: Technically successful ultrasound guided injection.       No results found for this or any previous visit (from the past 48 hour(s)). No results found.    Assessment and Plan: 51 y.o. female with right knee pain due to DJD. Injected today as noted above. Recheck as needed.  Left ear pain is very likely TMJ. Recommend over-the-counter bite guard and recheck as needed.  No orders of the defined types were placed in this encounter.  No orders of the defined types were placed in this encounter.   Discussed warning signs or symptoms. Please see discharge instructions. Patient expresses understanding.

## 2017-10-03 ENCOUNTER — Other Ambulatory Visit: Payer: Self-pay | Admitting: Physician Assistant

## 2017-10-03 MED FILL — CYCLOBENZAPRINE 10 MG TAB: 10 | 30 days supply | Qty: 30 | Fill #2

## 2017-10-03 MED FILL — SIMPONI 100 MG/ML SOAJ: 100 | 30 days supply | Qty: 1 | Fill #1

## 2017-10-03 MED FILL — HYDROXYCHLOROQUINE 200 MG T: 200 | 90 days supply | Qty: 135 | Fill #1

## 2017-10-03 MED FILL — NABUMETONE 750 MG TABLET: 750 | 30 days supply | Qty: 60 | Fill #1

## 2017-10-03 MED FILL — PANTOPRAZOLE SOD DR 40 MG T: 40 | 90 days supply | Qty: 90 | Fill #1

## 2017-10-04 MED FILL — MONTELUKAST SOD 10 MG TAB: 10 | 30 days supply | Qty: 30 | Fill #0

## 2017-10-15 ENCOUNTER — Ambulatory Visit (INDEPENDENT_AMBULATORY_CARE_PROVIDER_SITE_OTHER): Payer: 59 | Admitting: Gastroenterology

## 2017-10-15 ENCOUNTER — Other Ambulatory Visit: Payer: 59

## 2017-10-15 ENCOUNTER — Encounter: Payer: Self-pay | Admitting: Gastroenterology

## 2017-10-15 VITALS — BP 102/72 | HR 88 | Ht 63.0 in | Wt 169.0 lb

## 2017-10-15 DIAGNOSIS — K2100 Gastro-esophageal reflux disease with esophagitis, without bleeding: Secondary | ICD-10-CM

## 2017-10-15 DIAGNOSIS — Z86010 Personal history of colonic polyps: Secondary | ICD-10-CM

## 2017-10-15 DIAGNOSIS — R197 Diarrhea, unspecified: Secondary | ICD-10-CM

## 2017-10-15 MED ORDER — PANTOPRAZOLE SODIUM 40 MG PO TBEC: 40.0000 mg | DELAYED_RELEASE_TABLET | Freq: Two times a day (BID) | ORAL | 11 refills | Status: DC

## 2017-10-15 MED ORDER — GLYCOPYRROLATE 2 MG PO TABS: 2.0000 mg | ORAL_TABLET | Freq: Two times a day (BID) | ORAL | 11 refills | Status: DC

## 2017-10-15 MED ORDER — RANITIDINE HCL 300 MG PO CAPS: 300.0000 mg | ORAL_CAPSULE | Freq: Every evening | ORAL | 11 refills | Status: DC

## 2017-10-15 MED FILL — GLYCOPYRROLATE 2 MG TABLET: 2 | 30 days supply | Qty: 60 | Fill #0

## 2017-10-15 MED FILL — raNITIdine HCL 300 MG TABS: 300 | 30 days supply | Qty: 30 | Fill #0

## 2017-10-15 NOTE — Progress Notes (Addendum)
    History of Present Illness: This is a 51 year old female returning for follow-up of GERD, diarrhea and right lower quadrant pain. Her reflux symptoms are under better control on a PPI twice daily however she has frequent nocturnal heartburn and regurgitation. She relates persistent problems with urgent postprandial diarrhea. Levsin as effective for her symptoms but causes some drowsiness so she cannot take it at work. She has used dicyclomine in the past which has not been effective. She is lactose intolerant and maintains a lactose free diet. She has tried minimizing caffeine intake without benefit. She's tried a vegetarian diet without benefit.  Colon 07/2017 - The entire examined colon is normal on direct and retroflexion views. - The examined portion of the ileum was normal. - No specimens collected.  EGD 07/2017  - LA Grade B (one or more mucosal breaks greater than 5 mm, not extending between the tops of two mucosal folds) esophagitis with no bleeding was found in the distal esophagus. - The exam of the esophagus was otherwise normal. - A single localized, small non-bleeding erosion was found in the prepyloric region of the stomach. There were no stigmata of recent bleeding. - A small hiatal hernia was present. - The exam of the stomach was otherwise normal. - The duodenal bulb and second portion of the duodenum were normal.   Current Medications, Allergies, Past Medical History, Past Surgical History, Family History and Social History were reviewed in [employer] record.  Physical Exam: General: Well developed, well nourished, no acute distress Head: Normocephalic and atraumatic Eyes:  sclerae anicteric, EOMI Ears: Normal auditory acuity Mouth: No deformity or lesions Lungs: Clear throughout to auscultation Heart: Regular rate and rhythm; no murmurs, rubs or bruits Abdomen: Soft, mild RLQ tenderness and non distended. No masses, hepatosplenomegaly or  hernias noted. Normal Bowel sounds Rectal: not done Musculoskeletal: Symmetrical with no gross deformities  Pulses:  Normal pulses noted Extremities: No clubbing, cyanosis, edema or deformities noted Neurological: Alert oriented x 4, grossly nonfocal Psychological:  Alert and cooperative. Normal mood and affect  Assessment and Recommendations:  1. GERD with LA class B erosive esophagitis. Continue pantoprazole 40 mg twice daily. Begin 4" bedblocks. Add ranitidine 300 mg hs.   2. Personal history of adenomatous colon polyps. Surveillance colonoscopy 07/2022.  3. Diarrhea urgent post prandial. Intermittent RLQ pain. Possible diabetic diarrhea, infection, IBS-D. GI pathogen panel. Fecal elastase. Improved control of DM-she is scheduled to have a new insulin pump in December. Begin glycopyrrolate 1 mg by mouth twice a day. REV in 6 weeks.   4. Lactose intolerance. Avoid lactose products long-term.

## 2017-10-15 NOTE — Patient Instructions (Addendum)
We have sent the following medications to your pharmacy for you to pick up at your convenience: ranitidine at bedtime, glycopyrrolate and protonix.   Your physician has requested that you go to the basement for lab work before leaving today.  You have been given a fod map diet to follow.   Patient advised to avoid spicy, acidic, citrus, chocolate, mints, fruit and fruit juices.  Limit the intake of caffeine, alcohol and Soda.  Don't exercise too soon after eating.  Don't lie down within 3-4 hours of eating.  Elevate the head of your bed with 4 " bed blocks.   Thank you for choosing me and [facility].  Pricilla Riffle. Dagoberto Ligas., MD., [facility]

## 2017-10-17 MED FILL — LOSARTAN POTASSIUM 50 MG TA: 50 | 90 days supply | Qty: 90 | Fill #0

## 2017-10-18 ENCOUNTER — Ambulatory Visit (INDEPENDENT_AMBULATORY_CARE_PROVIDER_SITE_OTHER): Payer: 59 | Admitting: Family Medicine

## 2017-10-18 VITALS — BP 121/79 | HR 78 | Temp 98.2°F

## 2017-10-18 DIAGNOSIS — H9202 Otalgia, left ear: Secondary | ICD-10-CM

## 2017-10-18 MED ORDER — PREDNISONE 10 MG PO TABS: 30.0000 mg | ORAL_TABLET | Freq: Every day | ORAL | 0 refills | Status: DC

## 2017-10-18 MED FILL — predniSONE 10 MG TABS: 10 | 5 days supply | Qty: 15 | Fill #0

## 2017-10-18 NOTE — Progress Notes (Signed)
[name] is a 51 y.o. female who presents to [facility] today for left ear pain. Patient notes continued left ear pain. She was seen a few weeks ago for this and was thought to have both a serous effusion as well as TMJ. She has been using a bite guard which has helped. She notes continued left ear pressure and mild pain. She also notes decreased hearing. She denies new fevers or chills vomiting or diarrhea.   Past Medical History:  Diagnosis Date  . ACUT MI ANTEROLAT WALL SUBSQT EPIS CARE   . Acute maxillary sinusitis   . ALLERGIC RHINITIS, SEASONAL   . ARTHRITIS, RHEUMATOID   . Atrial fibrillation ([place])    a. after CABG.  . CAD, ARTERY BYPASS GRAFT    a. DES to RCA in 2010 then LAD occlusion s/p CABG 3 06/07/2009 with LIMA to LAD, reverse SVG to D1, reverse SVG to distal RCA. b. Cath 05/08/2016 slightly hypodense region in the intermediate branch, however she had excellent flow, FFR was normal. Vein graft to PDA and the posterolateral branch is patent, patent LIMA to LAD, occluded SVG to diagonal.  . CARPAL TUNNEL SYNDROME, BILATERAL   . CHOLELITHIASIS   . Contrast media allergy   . DERMATITIS, ALLERGIC   . DIABETES MELLITUS, TYPE I    on insulin pump  . DIABETIC  RETINOPATHY   . Hiatal hernia   . HYPERLIPIDEMIA-MIXED   . HYPERTHYROIDISM   . Insulin pump in place   . MIGRAINE W/O AURA W/INTRACT W/STATUS MIGRAINOSUS 02/19/2008  . SINUS TACHYCARDIA 11/08/2010  . TRIGGER FINGER   . URI    Past Surgical History:  Procedure Laterality Date  . ABDOMINAL HYSTERECTOMY    . Caesarean section    . CARDIAC CATHETERIZATION N/A 05/08/2016   Procedure: Left Heart Cath and Cors/Grafts Angiography;  Surgeon: Burnell Blanks, MD;  Location: [facility];  Service: Cardiovascular;  Laterality: N/A;  . CARPAL TUNNEL RELEASE    . CHOLECYSTECTOMY    . CORONARY  ARTERY BYPASS GRAFT    . VITRECTOMY     Social History  Substance Use Topics  . Smoking status: Never Smoker  . Smokeless tobacco: Never Used  . Alcohol use Yes     Comment: rarely 1 every 6 months   family history includes Colon polyps in her father; Depression in her unknown relative; Diabetes in her unknown relative; Heart disease in her unknown relative; Hyperlipidemia in her unknown relative; Hypertension in her unknown relative; Migraines in her unknown relative; Stroke in her paternal grandmother.  ROS as above:  Medications: Current Outpatient Prescriptions  Medication Sig Dispense Refill  . aspirin 81 MG tablet Take 81 mg by mouth daily.     . clopidogrel (PLAVIX) 75 MG tablet Take 1 tablet (75 mg total) by mouth daily. 90 tablet 3  . cyclobenzaprine (FLEXERIL) 10 MG tablet TAKE 1 TABLET (10 MG TOTAL) BY MOUTH AT BEDTIME. 30 tablet 5  . EPINEPHrine 0.15 MG/0.15ML IJ injection Inject 0.15 mLs (0.15 mg total) into the skin as needed. Inject as directed. 2 Device 1  . folic acid (FOLVITE) 1 MG tablet TAKE TWO TABLETS BY MOUTH DAILY 180 tablet 3  . furosemide (LASIX) 40 MG tablet Take 40 mg by mouth daily as needed for edema (When she feels like she is swelling).    Marland Kitchen glycopyrrolate (ROBINUL) 2 MG tablet Take 1 tablet (2 mg total) by  mouth 2 (two) times daily. 60 tablet 11  . Golimumab (SIMPONI) 100 MG/ML SOAJ INJECT ONE DEVICE UNDER THE SKIN ONCE MONTHLY 1 mL 1  . hydroxychloroquine (PLAQUENIL) 200 MG tablet TAKE 1 TABLET BY MOUTH EVERY MORNING AND 1/2 TABLET EVERY EVENING 135 tablet 1  . hyoscyamine (LEVSIN SL) 0.125 MG SL tablet Place 1 tablet (0.125 mg total) under the tongue every 4 (four) hours as needed for cramping. 60 tablet 2  . insulin lispro (HUMALOG) 100 UNIT/ML injection FOR USE IN INSULIN PUMP. TOTAL DAILY INSULIN DOSE = UP TO 90 UNITS.    Marland Kitchen isosorbide mononitrate (IMDUR) 30 MG 24 hr tablet Take 1 tablet (30 mg total) by mouth daily. 90 tablet 3  . leflunomide (ARAVA)  20 MG tablet TAKE 1 TABLET BY MOUTH DAILY 90 tablet 0  . losartan (COZAAR) 50 MG tablet Take 1 tablet (50 mg total) by mouth daily. 90 tablet 3  . metoprolol tartrate (LOPRESSOR) 25 MG tablet Take 1 tablet (25 mg total) by mouth 2 (two) times daily. 180 tablet 3  . montelukast (SINGULAIR) 10 MG tablet TAKE 1 TABLET BY MOUTH AT BEDTIME 30 tablet 0  . nabumetone (RELAFEN) 750 MG tablet TAKE 1 TABLET BY MOUTH TWICE DAILY 60 tablet 3  . Omega-3 Fatty Acids (FISH OIL) 1200 MG CAPS Take 1,200 mg by mouth 2 (two) times daily.    . ONE TOUCH ULTRA TEST test strip   1  . pantoprazole (PROTONIX) 40 MG tablet Take 1 tablet (40 mg total) by mouth 2 (two) times daily. 60 tablet 11  . potassium chloride (K-DUR) 10 MEQ tablet Take 1 tablet (10 mEq total) by mouth daily. 90 tablet 3  . predniSONE (DELTASONE) 10 MG tablet Take 3 tablets (30 mg total) by mouth daily with breakfast. 15 tablet 0  . ranitidine (ZANTAC) 300 MG capsule Take 1 capsule (300 mg total) by mouth every evening. 30 capsule 11  . rosuvastatin (CRESTOR) 10 MG tablet Take 1 tablet (10 mg total) by mouth daily. 90 tablet 3  . Suvorexant (BELSOMRA) 10 MG TABS Take 1 tablet by mouth at bedtime. 30 tablet 1  . topiramate (TOPAMAX) 100 MG tablet Take 1 tablet (100 mg total) by mouth 2 (two) times daily. 60 tablet 3   Current Facility-Administered Medications  Medication Dose Route Frequency Provider Last Rate Last Dose  . 0.9 %  sodium chloride infusion  500 mL Intravenous Continuous Ladene Artist, MD       Allergies  Allergen Reactions  . Prochlorperazine Rash    Neuro problems per pt Neurological reaction  . Ramipril Swelling, Rash and Other (See Comments)  . Shellfish-Derived Products Swelling    Shrimp(Facial swelling) Shrimp(Facial swelling)  . Atorvastatin Rash    Elevated LFT's Elevated LFT's Elevated LFT's  . Compazine  [Prochlorperazine Edisylate] Other (See Comments)    Neurological reaction  . Etanercept Swelling and Rash    . Infliximab Rash  . Iohexol     Iv contrast dye -rash all over  . Orencia [Abatacept] Rash  . Shellfish Allergy Swelling    Shrimp(Facial swelling)  . Tofacitinib Rash and Other (See Comments)    Severe abdominal pain Severe abdominal pain Severe abdominal pain  . Prochlorperazine Edisylate     unknown  . Amiodarone Nausea Only  . Rituximab Rash    Causes a rash    Health Maintenance Health Maintenance  Topic Date Due  . OPHTHALMOLOGY EXAM  10/30/1977  . HIV Screening  10/30/1982  .  PAP SMEAR  05/31/2008  . PNEUMOCOCCAL POLYSACCHARIDE VACCINE (2) 10/16/2011  . TETANUS/TDAP  09/30/2013  . HEMOGLOBIN A1C  03/25/2017  . INFLUENZA VACCINE  09/19/2018 (Originally 07/31/2017)  . FOOT EXAM  08/02/2018     Exam:  BP 121/79   Pulse 78   Temp 98.2 F (36.8 C)  Gen: Well NAD HEENT: EOMI,  MMM Left tympanic membrane is nonerythematous. Small effusion with bubbles behind the left eardrum. The right is normal. Mastoids are nontender. Left TMJ is nontender. Lungs: Normal work of breathing. CTABL Heart: RRR no MRG Abd: NABS, Soft. Nondistended, Nontender Exts: Brisk capillary refill, warm and well perfused.    No results found for this or any previous visit (from the past 72 hour(s)). No results found.    Assessment and Plan: 51 y.o. female with left ear pain likely a combination of left TMJ and serous effusion. We had a discussion about options. Patient is still symptomatic. After discussing the risks and benefits plan for short course of oral steroids to help with potential eustachian tube dysfunction. If not better refer to ENT.   No orders of the defined types were placed in this encounter.  Meds ordered this encounter  Medications  . predniSONE (DELTASONE) 10 MG tablet    Sig: Take 3 tablets (30 mg total) by mouth daily with breakfast.    Dispense:  15 tablet    Refill:  0     Discussed warning signs or symptoms. Please see discharge instructions. Patient  expresses understanding.

## 2017-10-18 NOTE — Patient Instructions (Signed)
Thank you for coming in today. Take the short course of oral prednisone.  If not better let us know.  Next steps would be ENT.    Earache, Adult An earache, or ear pain, can be caused by many things, including:  An infection.  Ear wax buildup.  Ear pressure.  Something in the ear that should not be there (foreign body).  A sore throat.  Tooth problems.  Jaw problems.  Treatment of the earache will depend on the cause. If the cause is not clear or cannot be determined, you may need to watch your symptoms until your earache goes away or until a cause is found. Follow these instructions at home: Pay attention to any changes in your symptoms. Take these actions to help with your pain:  Take or apply over-the-counter and prescription medicines only as told by your health care provider.  If you were prescribed an antibiotic medicine, use it as told by your health care provider. Do not stop using the antibiotic even if you start to feel better.  Do not put anything in your ear other than medicine that is prescribed by your health care provider.  If directed, apply heat to the affected area as often as told by your health care provider. Use the heat source that your health care provider recommends, such as a moist heat pack or a heating pad. ? Place a towel between your skin and the heat source. ? Leave the heat on for 20-30 minutes. ? Remove the heat if your skin turns bright red. This is especially important if you are unable to feel pain, heat, or cold. You may have a greater risk of getting burned.  If directed, put ice on the ear: ? Put ice in a plastic bag. ? Place a towel between your skin and the bag. ? Leave the ice on for 20 minutes, 2-3 times a day.  Try resting in an upright position instead of lying down. This may help to reduce pressure in your ear and relieve pain.  Chew gum if it helps to relieve your ear pain.  Treat any allergies as told by your health care  provider.  Keep all follow-up visits as told by your health care provider. This is important.  Contact a health care provider if:  Your pain does not improve within 2 days.  Your earache gets worse.  You have new symptoms.  You have a fever. Get help right away if:  You have a severe headache.  You have a stiff neck.  You have trouble swallowing.  You have redness or swelling behind your ear.  You have fluid or blood coming from your ear.  You have hearing loss.  You feel dizzy. This information is not intended to replace advice given to you by your health care provider. Make sure you discuss any questions you have with your health care provider. Document Released: 08/03/2004 Document Revised: 08/14/2016 Document Reviewed: 06/11/2016 Elsevier Interactive Patient Education  [employer].

## 2017-10-22 DIAGNOSIS — E0500 Thyrotoxicosis with diffuse goiter without thyrotoxic crisis or storm: Secondary | ICD-10-CM | POA: Diagnosis not present

## 2017-10-22 DIAGNOSIS — E10319 Type 1 diabetes mellitus with unspecified diabetic retinopathy without macular edema: Secondary | ICD-10-CM | POA: Diagnosis not present

## 2017-10-28 MED FILL — ROSUVASTATIN CALCIUM 10 MG: 10 | 90 days supply | Qty: 90 | Fill #1

## 2017-10-28 MED FILL — TOPIRAMATE 100 MG TABLET: 100 | 30 days supply | Qty: 60 | Fill #1

## 2017-10-28 MED FILL — CYCLOBENZAPRINE 10 MG TAB: 10 | 30 days supply | Qty: 30 | Fill #3

## 2017-10-28 MED FILL — CLOPIDOGREL 75 MG TABLET: 75 | 90 days supply | Qty: 90 | Fill #1

## 2017-10-31 ENCOUNTER — Other Ambulatory Visit: Payer: Self-pay | Admitting: Rheumatology

## 2017-11-01 ENCOUNTER — Other Ambulatory Visit: Payer: Self-pay | Admitting: Pharmacist

## 2017-11-01 MED ORDER — GOLIMUMAB 100 MG/ML ~~LOC~~ SOAJ: SUBCUTANEOUS | 0 refills | Status: DC

## 2017-11-01 NOTE — Telephone Encounter (Signed)
08/02/17 last visit 01/13/18 next visit Labs: 08/02/17  WNL TB Gold: 08/02/17 Neg  Okay to refill per Dr. Estanislado Pandy

## 2017-11-13 MED FILL — SIMPONI 100 MG/ML SOAJ: 100 | 30 days supply | Qty: 1 | Fill #0

## 2017-11-15 ENCOUNTER — Other Ambulatory Visit: Payer: Self-pay | Admitting: Rheumatology

## 2017-11-15 ENCOUNTER — Other Ambulatory Visit: Payer: Self-pay | Admitting: Physician Assistant

## 2017-11-15 MED FILL — LEFLUNOMIDE 20 MG TABLET: 20 | 30 days supply | Qty: 30 | Fill #0

## 2017-11-15 MED FILL — MONTELUKAST SOD 10 MG TAB: 10 | 30 days supply | Qty: 30 | Fill #0

## 2017-11-15 MED FILL — raNITIdine HCL 300 MG TABS: 300 | 30 days supply | Qty: 30 | Fill #1

## 2017-11-15 MED FILL — NABUMETONE 750 MG TABLET: 750 | 30 days supply | Qty: 60 | Fill #2

## 2017-11-15 MED FILL — METOPROLOL TARTRATE 25 MG T: 25 | 90 days supply | Qty: 180 | Fill #1

## 2017-11-15 NOTE — Telephone Encounter (Signed)
Last visit: 08/02/2017 Next visit: 01/13/2018 Labs: 08/02/2017 WNL   Left message to advise patient she is due for labs.   Okay to refill 30 day supply, per Dr. Estanislado Pandy.

## 2017-11-18 ENCOUNTER — Other Ambulatory Visit: Payer: Self-pay | Admitting: *Deleted

## 2017-11-18 NOTE — Patient Outreach (Addendum)
Spoke to patient via phone on 11/14/17 advising that disease self-management services will be transitioned from the [facility] program to [facility] in 2019. Ensured that she has completed the health risk assessment on the [email or web address] website. Also advised Phyllicia that a letter will be mailed to the home residence with details of this transition.                                                          Will close case to [facility] diabetes program. Barrington Ellison RN,CCM,CDE [place] [facility] Coordinator [facility] and [employer] [phone] Office Fax [phone]

## 2017-11-19 MED FILL — AMOXICILLIN 875 MG TABLET: 875 | 7 days supply | Qty: 14 | Fill #0

## 2017-11-19 MED FILL — IBUPROFEN 600 MG TABLET: 600 | 5 days supply | Qty: 20 | Fill #0

## 2017-11-19 MED FILL — HYDROCODON-APAP 5-325: 5-325 | 4 days supply | Qty: 15 | Fill #0

## 2017-11-20 DIAGNOSIS — E1039 Type 1 diabetes mellitus with other diabetic ophthalmic complication: Secondary | ICD-10-CM | POA: Diagnosis not present

## 2017-11-20 DIAGNOSIS — E1065 Type 1 diabetes mellitus with hyperglycemia: Secondary | ICD-10-CM | POA: Diagnosis not present

## 2017-12-04 ENCOUNTER — Ambulatory Visit: Payer: 59 | Admitting: Gastroenterology

## 2017-12-09 ENCOUNTER — Other Ambulatory Visit: Payer: Self-pay | Admitting: Rheumatology

## 2017-12-09 MED FILL — CYCLOBENZAPRINE 10 MG TABLE: 10 | 30 days supply | Qty: 30 | Fill #4

## 2017-12-09 MED FILL — ISOSORBIDE MN ER 30 MG TAB: 30 | 90 days supply | Qty: 90 | Fill #1

## 2017-12-09 MED FILL — MONTELUKAST SOD 10 MG TAB: 10 | 30 days supply | Qty: 30 | Fill #1

## 2017-12-09 MED FILL — FOLIC ACID 1 MG TABLET: 1 | 90 days supply | Qty: 180 | Fill #1

## 2017-12-09 MED FILL — raNITIdine HCL 300 MG TABS: 300 | 30 days supply | Qty: 30 | Fill #2

## 2017-12-09 MED FILL — NABUMETONE 750 MG TABLET: 750 | 30 days supply | Qty: 60 | Fill #3

## 2017-12-10 ENCOUNTER — Telehealth: Payer: Self-pay

## 2017-12-10 NOTE — Telephone Encounter (Signed)
Patient would like a call back concerning her labs.  Stated that she had labs done at the end of October with another doctor.  Cb# [phone].  Please advise.

## 2017-12-11 ENCOUNTER — Telehealth: Payer: Self-pay

## 2017-12-11 MED FILL — CONTOUR NEXT STRIPS: 34 days supply | Qty: 200 | Fill #2

## 2017-12-11 MED FILL — SIMPONI 100 MG/ML SOAJ: 100 | 30 days supply | Qty: 1 | Fill #1

## 2017-12-11 MED FILL — LEFLUNOMIDE 20 MG TABLET: 20 | 30 days supply | Qty: 30 | Fill #0

## 2017-12-11 NOTE — Telephone Encounter (Signed)
Message for patient to call the office.  

## 2017-12-11 NOTE — Telephone Encounter (Signed)
Last visit: 08/02/2017 Next visit: 01/13/2018 Labs: 08/02/2017 WNL   Left message to advise patient she is due to update labs.  Okay to refill 30 day supply per Dr. Estanislado Pandy

## 2017-12-11 NOTE — Telephone Encounter (Signed)
Patient was returning Andrea's call.  Cb# [phone].  Please advise.

## 2017-12-12 NOTE — Telephone Encounter (Signed)
Attempted to contact the patient and left message for patient to call the office.  

## 2017-12-12 NOTE — Telephone Encounter (Signed)
See previous phone note.  

## 2017-12-17 ENCOUNTER — Ambulatory Visit (INDEPENDENT_AMBULATORY_CARE_PROVIDER_SITE_OTHER): Payer: 59 | Admitting: Ophthalmology

## 2017-12-18 NOTE — Telephone Encounter (Signed)
Patient has not responded to messages left.

## 2017-12-30 NOTE — Progress Notes (Deleted)
Office Visit Note  Patient: [name]             Date of Birth: 01/16/1966           MRN: [id number]             PCP: Lavada Mesi Referring: Lavada Mesi Visit Date: 01/13/2018 Occupation: @GUAROCC @    Subjective:  No chief complaint on file.   History of Present Illness: [name] is a 51 y.o. female ***   Activities of Daily Living:  Patient reports morning stiffness for *** {minute/hour:19697}.   Patient {ACTIONS;DENIES/REPORTS:21021675::"Denies"} nocturnal pain.  Difficulty dressing/grooming: {ACTIONS;DENIES/REPORTS:21021675::"Denies"} Difficulty climbing stairs: {ACTIONS;DENIES/REPORTS:21021675::"Denies"} Difficulty getting out of chair: {ACTIONS;DENIES/REPORTS:21021675::"Denies"} Difficulty using hands for taps, buttons, cutlery, and/or writing: {ACTIONS;DENIES/REPORTS:21021675::"Denies"}   No Rheumatology ROS completed.   PMFS History:  Patient Active Problem List   Diagnosis Date Noted  . Vitamin D deficiency 07/04/2017  . B12 deficiency 07/04/2017  . Abnormal CT scan 06/19/2017  . Gastroesophageal reflux disease 06/19/2017  . Antiplatelet or antithrombotic long-term use 06/19/2017  . Sinus pressure 03/15/2017  . Nasal congestion 03/15/2017  . Errin [place] disease 02/01/2017  . Sleep difficulties 02/01/2017  . No energy 02/01/2017  . Osteopenia 02/01/2017  . Hypertension, essential 05/08/2016  . IDDM (insulin dependent diabetes mellitus) ([place]) 05/08/2016  . Coronary artery disease involving native coronary artery of native heart with angina pectoris ([place])   . HYPERTHYROIDISM 11/09/2010  . SINUS TACHYCARDIA 11/08/2010  . SHORTNESS OF BREATH 11/08/2010  . DERMATITIS, ALLERGIC 07/20/2010  . EDEMA 07/12/2010  . DIZZINESS 11/14/2009  . ATRIAL FIBRILLATION 07/20/2009  . CAD, ARTERY BYPASS GRAFT 06/07/2009  . ANGINA, STABLE/EXERTIONAL 06/02/2009  . HYPERLIPIDEMIA-MIXED 04/26/2009  . ACUT MI ANTEROLAT WALL SUBSQT EPIS CARE 04/26/2009    . CHEST PAIN 04/26/2009  . DIABETIC  RETINOPATHY 04/25/2009  . CARPAL TUNNEL SYNDROME, BILATERAL 04/25/2009  . TRIGGER FINGER 04/25/2009  . MIGRAINE W/O AURA W/INTRACT W/STATUS MIGRAINOSUS 02/19/2008  . ALLERGIC RHINITIS, SEASONAL 10/16/2006  . CHOLELITHIASIS 10/16/2006  . Rheumatoid arthritis ([place]) 10/16/2006    Past Medical History:  Diagnosis Date  . ACUT MI ANTEROLAT WALL SUBSQT EPIS CARE   . Acute maxillary sinusitis   . ALLERGIC RHINITIS, SEASONAL   . ARTHRITIS, RHEUMATOID   . Atrial fibrillation ([place])    a. after CABG.  . CAD, ARTERY BYPASS GRAFT    a. DES to RCA in 2010 then LAD occlusion s/p CABG 3 06/07/2009 with LIMA to LAD, reverse SVG to D1, reverse SVG to distal RCA. b. Cath 05/08/2016 slightly hypodense region in the intermediate branch, however she had excellent flow, FFR was normal. Vein graft to PDA and the posterolateral branch is patent, patent LIMA to LAD, occluded SVG to diagonal.  . CARPAL TUNNEL SYNDROME, BILATERAL   . CHOLELITHIASIS   . Contrast media allergy   . DERMATITIS, ALLERGIC   . DIABETES MELLITUS, TYPE I    on insulin pump  . DIABETIC  RETINOPATHY   . Hiatal hernia   . HYPERLIPIDEMIA-MIXED   . HYPERTHYROIDISM   . Insulin pump in place   . MIGRAINE W/O AURA W/INTRACT W/STATUS MIGRAINOSUS 02/19/2008  . SINUS TACHYCARDIA 11/08/2010  . TRIGGER FINGER   . URI     Family History  Problem Relation Age of Onset  . Colon polyps Father   . Diabetes Unknown   . Heart disease Unknown   . Hypertension Unknown   . Hyperlipidemia Unknown   . Depression Unknown   . Migraines  Unknown   . Stroke Paternal Grandmother   . Heart attack Neg Hx   . Colon cancer Neg Hx   . Stomach cancer Neg Hx   . Esophageal cancer Neg Hx    Past Surgical History:  Procedure Laterality Date  . ABDOMINAL HYSTERECTOMY    . Caesarean section    . CARDIAC CATHETERIZATION N/A 05/08/2016   Procedure: Left Heart Cath and Cors/Grafts Angiography;  Surgeon: Burnell Blanks,  MD;  Location: [facility];  Service: Cardiovascular;  Laterality: N/A;  . CARPAL TUNNEL RELEASE    . CHOLECYSTECTOMY    . CORONARY ARTERY BYPASS GRAFT    . VITRECTOMY     Social History   Social History Narrative   Divorced. Has 2 kids(fraternal twins) daughters age 75. Works at [employer], Never smoked, denies ETOH, no drugs. Drinks diet coke. No exercise.         Objective: Vital Signs: There were no vitals taken for this visit.   Physical Exam   Musculoskeletal Exam: ***  CDAI Exam: No CDAI exam completed.    Investigation: No additional findings. TB Gold: 08/02/2017 Negative  CBC Latest Ref Rng & Units 08/02/2017 05/22/2017 05/02/2017  WBC 3.8 - 10.8 K/uL 5.2 6.5 6.2  Hemoglobin 11.7 - 15.5 g/dL 13.7 11.9 13.2  Hematocrit 35.0 - 45.0 % 38.9 34.9(L) 39.9  Platelets 140 - 400 K/uL 195 172 213   CMP Latest Ref Rng & Units 08/02/2017 05/22/2017 01/01/2017  Glucose 65 - 99 mg/dL 109(H) 240(H) 104(H)  BUN 7 - 25 mg/dL 6(L) 10 10  Creatinine 0.50 - 1.10 mg/dL 0.73 0.89 0.65  Sodium 135 - 146 mmol/L 136 142 139  Potassium 3.5 - 5.3 mmol/L 3.6 4.2 4.6  Chloride 98 - 110 mmol/L 100 108 106  CO2 20 - 31 mmol/L 23 23 27   Calcium 8.6 - 10.2 mg/dL 8.9 9.3 9.3  Total Protein 6.1 - 8.1 g/dL 6.6 6.5 6.4  Total Bilirubin 0.2 - 1.2 mg/dL 0.5 0.6 0.4  Alkaline Phos 33 - 115 U/L 70 75 70  AST 10 - 35 U/L 18 29 13   ALT 6 - 29 U/L 13 29 11     Imaging: No results found.  Speciality Comments: No specialty comments available.    Procedures:  No procedures performed Allergies: Prochlorperazine; Ramipril; Shellfish-derived products; Atorvastatin; Compazine  [prochlorperazine edisylate]; Etanercept; Infliximab; Iohexol; Orencia [abatacept]; Shellfish allergy; Tofacitinib; Prochlorperazine edisylate; Amiodarone; and Rituximab   Assessment / Plan:     Visit Diagnoses: No diagnosis found.    Orders: No orders of the defined types were placed in this encounter.  No  orders of the defined types were placed in this encounter.   Face-to-face time spent with patient was *** minutes. 50% of time was spent in counseling and coordination of care.  Follow-Up Instructions: No Follow-up on file.   Earnestine Mealing, CMA  Note - This record has been created using Editor, commissioning.  Chart creation errors have been sought, but may not always  have been located. Such creation errors do not reflect on  the standard of medical care.

## 2018-01-01 ENCOUNTER — Encounter (INDEPENDENT_AMBULATORY_CARE_PROVIDER_SITE_OTHER): Payer: 59 | Admitting: Ophthalmology

## 2018-01-01 DIAGNOSIS — E10319 Type 1 diabetes mellitus with unspecified diabetic retinopathy without macular edema: Secondary | ICD-10-CM

## 2018-01-01 DIAGNOSIS — H35033 Hypertensive retinopathy, bilateral: Secondary | ICD-10-CM | POA: Diagnosis not present

## 2018-01-01 DIAGNOSIS — I10 Essential (primary) hypertension: Secondary | ICD-10-CM

## 2018-01-01 DIAGNOSIS — E103593 Type 1 diabetes mellitus with proliferative diabetic retinopathy without macular edema, bilateral: Secondary | ICD-10-CM

## 2018-01-02 ENCOUNTER — Ambulatory Visit: Payer: 59 | Admitting: Rheumatology

## 2018-01-02 ENCOUNTER — Ambulatory Visit (INDEPENDENT_AMBULATORY_CARE_PROVIDER_SITE_OTHER): Payer: 59

## 2018-01-02 ENCOUNTER — Encounter: Payer: Self-pay | Admitting: Rheumatology

## 2018-01-02 VITALS — BP 120/75 | HR 59 | Resp 12 | Ht 63.0 in | Wt 175.0 lb

## 2018-01-02 DIAGNOSIS — L409 Psoriasis, unspecified: Secondary | ICD-10-CM

## 2018-01-02 DIAGNOSIS — G4709 Other insomnia: Secondary | ICD-10-CM | POA: Diagnosis not present

## 2018-01-02 DIAGNOSIS — Z8679 Personal history of other diseases of the circulatory system: Secondary | ICD-10-CM | POA: Diagnosis not present

## 2018-01-02 DIAGNOSIS — Z8719 Personal history of other diseases of the digestive system: Secondary | ICD-10-CM | POA: Diagnosis not present

## 2018-01-02 DIAGNOSIS — Z8639 Personal history of other endocrine, nutritional and metabolic disease: Secondary | ICD-10-CM

## 2018-01-02 DIAGNOSIS — Z79899 Other long term (current) drug therapy: Secondary | ICD-10-CM | POA: Diagnosis not present

## 2018-01-02 DIAGNOSIS — Z7902 Long term (current) use of antithrombotics/antiplatelets: Secondary | ICD-10-CM | POA: Diagnosis not present

## 2018-01-02 DIAGNOSIS — M0609 Rheumatoid arthritis without rheumatoid factor, multiple sites: Secondary | ICD-10-CM | POA: Diagnosis not present

## 2018-01-02 DIAGNOSIS — Z8669 Personal history of other diseases of the nervous system and sense organs: Secondary | ICD-10-CM | POA: Diagnosis not present

## 2018-01-02 DIAGNOSIS — M25551 Pain in right hip: Secondary | ICD-10-CM

## 2018-01-02 DIAGNOSIS — M8589 Other specified disorders of bone density and structure, multiple sites: Secondary | ICD-10-CM | POA: Diagnosis not present

## 2018-01-02 DIAGNOSIS — R5383 Other fatigue: Secondary | ICD-10-CM

## 2018-01-02 LAB — CBC WITH DIFFERENTIAL/PLATELET
BASOS PCT: 0.6 %
Basophils Absolute: 28 cells/uL (ref 0–200)
EOS PCT: 5.2 %
Eosinophils Absolute: 239 cells/uL (ref 15–500)
HCT: 38.6 % (ref 35.0–45.0)
HEMOGLOBIN: 13.8 g/dL (ref 11.7–15.5)
LYMPHS ABS: 2176 {cells}/uL (ref 850–3900)
MCH: 30.5 pg (ref 27.0–33.0)
MCHC: 35.8 g/dL (ref 32.0–36.0)
MCV: 85.2 fL (ref 80.0–100.0)
MONOS PCT: 12.5 %
MPV: 9.4 fL (ref 7.5–12.5)
Neutro Abs: 1582 cells/uL (ref 1500–7800)
Neutrophils Relative %: 34.4 %
Platelets: 178 10*3/uL (ref 140–400)
RBC: 4.53 10*6/uL (ref 3.80–5.10)
RDW: 12.1 % (ref 11.0–15.0)
Total Lymphocyte: 47.3 %
WBC mixed population: 575 cells/uL (ref 200–950)
WBC: 4.6 10*3/uL (ref 3.8–10.8)

## 2018-01-02 LAB — COMPLETE METABOLIC PANEL WITH GFR
AG Ratio: 2.5 (calc) (ref 1.0–2.5)
ALBUMIN MSPROF: 4.5 g/dL (ref 3.6–5.1)
ALKALINE PHOSPHATASE (APISO): 72 U/L (ref 33–130)
ALT: 27 U/L (ref 6–29)
AST: 22 U/L (ref 10–35)
BUN: 7 mg/dL (ref 7–25)
CALCIUM: 9.3 mg/dL (ref 8.6–10.4)
CO2: 30 mmol/L (ref 20–32)
CREATININE: 0.79 mg/dL (ref 0.50–1.05)
Chloride: 105 mmol/L (ref 98–110)
GFR, EST NON AFRICAN AMERICAN: 87 mL/min/{1.73_m2} (ref >=60)
GFR, Est African American: 101 mL/min/{1.73_m2} (ref >=60)
GLUCOSE: 73 mg/dL (ref 65–99)
Globulin: 1.8 g/dL (calc) — ABNORMAL LOW (ref 1.9–3.7)
Potassium: 4.1 mmol/L (ref 3.5–5.3)
Sodium: 142 mmol/L (ref 135–146)
TOTAL PROTEIN: 6.3 g/dL (ref 6.1–8.1)
Total Bilirubin: 0.5 mg/dL (ref 0.2–1.2)

## 2018-01-02 NOTE — Patient Instructions (Addendum)
Standing Labs We placed an order today for your standing lab work.    Please come back and get your standing labs in March and every 3 months  We have open lab Monday through Friday from 8:30-11:30 AM and 1:30-4 PM at the office of Dr. Bo Merino.   The office is located at [address] [place], [place], [place] [address] No appointment is necessary.   Labs are drawn by [facility].  You may receive a bill from [facility] for your lab work. If you have any questions regarding directions or hours of operation,  please call [phone].      Hip Exercises Ask your health care provider which exercises are safe for you. Do exercises exactly as told by your health care provider and adjust them as directed. It is normal to feel mild stretching, pulling, tightness, or discomfort as you do these exercises, but you should stop right away if you feel sudden pain or your pain gets worse.Do not begin these exercises until told by your health care provider. STRETCHING AND RANGE OF MOTION EXERCISES These exercises warm up your muscles and joints and improve the movement and flexibility of your hip. These exercises also help to relieve pain, numbness, and tingling. Exercise A: Hamstrings, Supine  1. Lie on your back. 2. Loop a belt or towel over the ball of your left / rightfoot. The ball of your foot is on the walking surface, right under your toes. 3. Straighten your left / rightknee and slowly pull on the belt to raise your leg. ? Do not let your left / right knee bend while you do this. ? Keep your other leg flat on the floor. ? Raise the left / right leg until you feel a gentle stretch behind your left / right knee or thigh. 4. Hold this position for __________ seconds. 5. Slowly return your leg to the starting position. Repeat __________ times. Complete this stretch __________ times a day. Exercise B: Hip Rotators  1. Lie on your back on a firm surface. 2. Hold your left / right knee  with your left / right hand. Hold your ankle with your other hand. 3. Gently pull your left / right knee and rotate your lower leg toward your other shoulder. ? Pull until you feel a stretch in your buttocks. ? Keep your hips and shoulders firmly planted while you do this stretch. 4. Hold this position for __________ seconds. Repeat __________ times. Complete this stretch __________ times a day. Exercise C: V-Sit (Hamstrings and Adductors)  1. Sit on the floor with your legs extended in a large "V" shape. Keep your knees straight during this exercise. 2. Start with your head and chest upright, then bend at your waist to reach for your left foot (position A). You should feel a stretch in your right inner thigh. 3. Hold this position for __________ seconds. Then slowly return to the upright position. 4. Bend at your waist to reach forward (position B). You should feel a stretch behind both of your thighs and knees. 5. Hold this position for __________ seconds. Then slowly return to the upright position. 6. Bend at your waist to reach for your right foot (position C). You should feel a stretch in your left inner thigh. 7. Hold this position for __________ seconds. Then slowly return to the upright position. Repeat __________ times. Complete this stretch __________ times a day. Exercise D: Lunge (Hip Flexors)  1. Place your left / right knee on the floor  and bend your other knee so that is directly over your ankle. You should be half-kneeling. 2. Keep good posture with your head over your shoulders. 3. Tighten your buttocks to point your tailbone downward. This helps your back to keep from arching too much. 4. You should feel a gentle stretch in the front of your left / right thigh and hip. If you do not feel any resistance, slightly slide your other foot forward and then slowly lunge forward so your knee once again lines up over your ankle. 5. Make sure your tailbone continues to point  downward. 6. Hold this position for __________ seconds. Repeat __________ times. Complete this stretch __________ times a day. STRENGTHENING EXERCISES These exercises build strength and endurance in your hip. Endurance is the ability to use your muscles for a long time, even after they get tired. Exercise E: Bridge (Hip Extensors)  1. Lie on your back on a firm surface with your knees bent and your feet flat on the floor. 2. Tighten your buttocks muscles and lift your bottom off the floor until the trunk of your body is level with your thighs. ? Do not arch your back. ? You should feel the muscles working in your buttocks and the back of your thighs. If you do not feel these muscles, slide your feet 1-2 inches (2.5-5 cm) farther away from your buttocks. 3. Hold this position for __________ seconds. 4. Slowly lower your hips to the starting position. 5. Let your muscles relax completely between repetitions. 6. If this exercise is too easy, try doing it with your arms crossed over your chest. Repeat __________ times. Complete this exercise __________ times a day. Exercise F: Straight Leg Raises - Hip Abductors  1. Lie on your side with your left / right leg in the top position. Lie so your head, shoulder, knee, and hip line up with each other. You may bend your bottom knee to help you balance. 2. Roll your hips slightly forward, so your hips are stacked directly over each other and your left / right knee is facing forward. 3. Leading with your heel, lift your top leg 4-6 inches (10-15 cm). You should feel the muscles in your outer hip lifting. ? Do not let your foot drift forward. ? Do not let your knee roll toward the ceiling. 4. Hold this position for __________ seconds. 5. Slowly return to the starting position. 6. Let your muscles relax completely between repetitions. Repeat __________ times. Complete this exercise __________ times a day. Exercise G: Straight Leg Raises - Hip  Adductors  1. Lie on your side with your left / right leg in the bottom position. Lie so your head, shoulder, knee, and hip line up. You may place your upper foot in front to help you balance. 2. Roll your hips slightly forward, so your hips are stacked directly over each other and your left / right knee is facing forward. 3. Tense the muscles in your inner thigh and lift your bottom leg 4-6 inches (10-15 cm). 4. Hold this position for __________ seconds. 5. Slowly return to the starting position. 6. Let your muscles relax completely between repetitions. Repeat __________ times. Complete this exercise __________ times a day. Exercise H: Straight Leg Raises - Quadriceps  1. Lie on your back with your left / right leg extended and your other knee bent. 2. Tense the muscles in the front of your left / right thigh. When you do this, you should see your kneecap slide up  or see increased dimpling just above your knee. 3. Tighten these muscles even more and raise your leg 4-6 inches (10-15 cm) off the floor. 4. Hold this position for __________ seconds. 5. Keep these muscles tense as you lower your leg. 6. Relax the muscles slowly and completely between repetitions. Repeat __________ times. Complete this exercise __________ times a day. Exercise I: Hip Abductors, Standing 1. Tie one end of a rubber exercise band or tubing to a secure surface, such as a table or pole. 2. Loop the other end of the band or tubing around your left / right ankle. 3. Keeping your ankle with the band or tubing directly opposite of the secured end, step away until there is tension in the tubing or band. Hold onto a chair as needed for balance. 4. Lift your left / right leg out to your side. While you do this: ? Keep your back upright. ? Keep your shoulders over your hips. ? Keep your toes pointing forward. ? Make sure to use your hip muscles to lift your leg. Do not "throw" your leg or tip your body to lift your  leg. 5. Hold this position for __________ seconds. 6. Slowly return to the starting position. Repeat __________ times. Complete this exercise __________ times a day. Exercise J: Squats (Quadriceps) 1. Stand in a door frame so your feet and knees are in line with the frame. You may place your hands on the frame for balance. 2. Slowly bend your knees and lower your hips like you are going to sit in a chair. ? Keep your lower legs in a straight-up-and-down position. ? Do not let your hips go lower than your knees. ? Do not bend your knees lower than told by your health care provider. ? If your hip pain increases, do not bend as low. 3. Hold this position for ___________ seconds. 4. Slowly push with your legs to return to standing. Do not use your hands to pull yourself to standing. Repeat __________ times. Complete this exercise __________ times a day. This information is not intended to replace advice given to you by your health care provider. Make sure you discuss any questions you have with your health care provider. Document Released: 01/04/2006 Document Revised: 09/10/2016 Document Reviewed: 12/12/2015 Elsevier Interactive Patient Education  [employer].

## 2018-01-02 NOTE — Progress Notes (Signed)
Office Visit Note  Patient: [name]             Date of Birth: 1966/04/19           MRN: [id number]             PCP: Lavada Mesi Referring: Donella Stade, PA-C Visit Date: 01/02/2018 Occupation: @GUAROCC @    Subjective:  Right shoulder pain   History of Present Illness: [name] is a 52 y.o. female with seronegative rheumatoid arthritis and osteoarthritis.  She states sustained an injury of her right shoulder and mid-back at work in October 2018.  She denies any imaging that was performed.  She states she starts physical therapy next week.  She states she has noticed limited ROM in her right shoulder.  She denies any hand pain or swelling.  She states she feels she is on the best combination of medications that she has tried.  She is on Simponi injectable, Arava 20 mg daily, and Plaquenil 200 mg po one tablet in the morning and 1/2 tablet in the evening. She is also taking Folic acid 1 mg daily. She states she has increased joint stiffness.  She states she currently has psoriasis on bilateral knees and is using Clobetasol.  She continues to have chronic insomnia and fatigue.       Activities of Daily Living:  Patient reports morning stiffness for 45  minutes.   Patient Reports nocturnal pain.  Difficulty dressing/grooming: Denies Difficulty climbing stairs: Reports Difficulty getting out of chair: Reports Difficulty using hands for taps, buttons, cutlery, and/or writing: Denies   Review of Systems  Constitutional: Negative for fatigue and weakness.  HENT: Negative for mouth sores, mouth dryness and nose dryness.   Eyes: Positive for dryness. Negative for redness.  Respiratory: Negative for cough, hemoptysis, shortness of breath and difficulty breathing.   Cardiovascular: Negative.  Negative for chest pain, palpitations, hypertension and swelling in legs/feet.  Gastrointestinal: Positive for diarrhea. Negative for blood in stool and constipation.    Endocrine: Negative for increased urination.  Genitourinary: Negative for painful urination.  Musculoskeletal: Positive for arthralgias, joint pain and morning stiffness. Negative for joint swelling, myalgias, muscle weakness, muscle tenderness and myalgias.  Skin: Negative for color change, pallor, rash, hair loss, nodules/bumps, redness, skin tightness, ulcers and sensitivity to sunlight.  Neurological: Positive for headaches. Negative for dizziness and numbness.  Hematological: Negative for swollen glands.  Psychiatric/Behavioral: Positive for sleep disturbance. Negative for depressed mood. The patient is not nervous/anxious.     PMFS History:  Patient Active Problem List   Diagnosis Date Noted  . Vitamin D deficiency 07/04/2017  . B12 deficiency 07/04/2017  . Abnormal CT scan 06/19/2017  . Gastroesophageal reflux disease 06/19/2017  . Antiplatelet or antithrombotic long-term use 06/19/2017  . Sinus pressure 03/15/2017  . Nasal congestion 03/15/2017  . Graves disease 02/01/2017  . Sleep difficulties 02/01/2017  . No energy 02/01/2017  . Osteopenia 02/01/2017  . Hypertension, essential 05/08/2016  . IDDM (insulin dependent diabetes mellitus) ([place]) 05/08/2016  . Coronary artery disease involving native coronary artery of native heart with angina pectoris ([place])   . HYPERTHYROIDISM 11/09/2010  . SINUS TACHYCARDIA 11/08/2010  . SHORTNESS OF BREATH 11/08/2010  . DERMATITIS, ALLERGIC 07/20/2010  . EDEMA 07/12/2010  . DIZZINESS 11/14/2009  . ATRIAL FIBRILLATION 07/20/2009  . CAD, ARTERY BYPASS GRAFT 06/07/2009  . ANGINA, STABLE/EXERTIONAL 06/02/2009  . HYPERLIPIDEMIA-MIXED 04/26/2009  . ACUT MI ANTEROLAT WALL SUBSQT EPIS CARE 04/26/2009  .  CHEST PAIN 04/26/2009  . DIABETIC  RETINOPATHY 04/25/2009  . CARPAL TUNNEL SYNDROME, BILATERAL 04/25/2009  . TRIGGER FINGER 04/25/2009  . MIGRAINE W/O AURA W/INTRACT W/STATUS MIGRAINOSUS 02/19/2008  . ALLERGIC RHINITIS, SEASONAL 10/16/2006   . CHOLELITHIASIS 10/16/2006  . Rheumatoid arthritis ([place]) 10/16/2006    Past Medical History:  Diagnosis Date  . ACUT MI ANTEROLAT WALL SUBSQT EPIS CARE   . Acute maxillary sinusitis   . ALLERGIC RHINITIS, SEASONAL   . ARTHRITIS, RHEUMATOID   . Atrial fibrillation ([place])    a. after CABG.  . CAD, ARTERY BYPASS GRAFT    a. DES to RCA in 2010 then LAD occlusion s/p CABG 3 06/07/2009 with LIMA to LAD, reverse SVG to D1, reverse SVG to distal RCA. b. Cath 05/08/2016 slightly hypodense region in the intermediate branch, however she had excellent flow, FFR was normal. Vein graft to PDA and the posterolateral branch is patent, patent LIMA to LAD, occluded SVG to diagonal.  . CARPAL TUNNEL SYNDROME, BILATERAL   . CHOLELITHIASIS   . Contrast media allergy   . DERMATITIS, ALLERGIC   . DIABETES MELLITUS, TYPE I    on insulin pump  . DIABETIC  RETINOPATHY   . Hiatal hernia   . HYPERLIPIDEMIA-MIXED   . HYPERTHYROIDISM   . Insulin pump in place   . MIGRAINE W/O AURA W/INTRACT W/STATUS MIGRAINOSUS 02/19/2008  . SINUS TACHYCARDIA 11/08/2010  . TRIGGER FINGER   . URI     Family History  Problem Relation Age of Onset  . Colon polyps Father   . Diabetes Unknown   . Heart disease Unknown   . Hypertension Unknown   . Hyperlipidemia Unknown   . Depression Unknown   . Migraines Unknown   . Stroke Paternal Grandmother   . Heart attack Neg Hx   . Colon cancer Neg Hx   . Stomach cancer Neg Hx   . Esophageal cancer Neg Hx    Past Surgical History:  Procedure Laterality Date  . ABDOMINAL HYSTERECTOMY    . Caesarean section    . CARDIAC CATHETERIZATION N/A 05/08/2016   Procedure: Left Heart Cath and Cors/Grafts Angiography;  Surgeon: Burnell Blanks, MD;  Location: [facility];  Service: Cardiovascular;  Laterality: N/A;  . CARPAL TUNNEL RELEASE    . CHOLECYSTECTOMY    . CORONARY ARTERY BYPASS GRAFT    . VITRECTOMY     Social History   Social History Narrative   Divorced. Has 2  kids(fraternal twins) daughters age 24. Works at [employer], Never smoked, denies ETOH, no drugs. Drinks diet coke. No exercise.         Objective: Vital Signs: BP 120/75 (BP Location: Left Arm, Patient Position: Sitting, Cuff Size: Small)   Pulse (!) 59   Resp 12   Ht 5\' 3"  (1.6 m)   Wt 175 lb (79.4 kg)   BMI 31.00 kg/m    Physical Exam  Constitutional: She is oriented to person, place, and time. She appears well-developed and well-nourished.  HENT:  Head: Normocephalic and atraumatic.  Eyes: Conjunctivae and EOM are normal.  Neck: Normal range of motion.  Cardiovascular: Normal rate, regular rhythm, normal heart sounds and intact distal pulses.  Pulmonary/Chest: Effort normal and breath sounds normal.  Abdominal: Soft. Bowel sounds are normal.  Lymphadenopathy:    She has no cervical adenopathy.  Neurological: She is alert and oriented to person, place, and time.  Skin: Skin is warm and dry. Capillary refill takes less than 2 seconds.  Psychiatric: She has a normal mood and affect. Her behavior is normal.  Nursing note and vitals reviewed.    Musculoskeletal Exam: C-spine limited ROM.  Midline spinal discomfort of lower thoracic region.  No SI joint pain.  Right shoulder limited abduction.  Left shoulder ROM full ROM.  Left elbow contracture.  Wrists good ROM with no synovitis.  MCPs, PIPs, and DIPs good ROM with no synovitis.  Hip joints, knee joints, and ankle joints good ROM.  She had discomfort in her right groin with ROM of her right hip.  Knee crepitus bilaterally.  No warmth or effusion.  MTPs, PIPs, and DIPs good ROM with no synovitis.    CDAI Exam: CDAI Homunculus Exam:   Joint Counts:  CDAI Tender Joint count: 0 CDAI Swollen Joint count: 0  Global Assessments:  Patient Global Assessment: 5   CDAI Calculated Score: 5    Investigation: No additional findings.TB Gold: 08/02/2017 negative  CBC Latest Ref Rng & Units 08/02/2017 05/22/2017 05/02/2017  WBC  3.8 - 10.8 K/uL 5.2 6.5 6.2  Hemoglobin 11.7 - 15.5 g/dL 13.7 11.9 13.2  Hematocrit 35.0 - 45.0 % 38.9 34.9(L) 39.9  Platelets 140 - 400 K/uL 195 172 213   CMP Latest Ref Rng & Units 08/02/2017 05/22/2017 01/01/2017  Glucose 65 - 99 mg/dL 109(H) 240(H) 104(H)  BUN 7 - 25 mg/dL 6(L) 10 10  Creatinine 0.50 - 1.10 mg/dL 0.73 0.89 0.65  Sodium 135 - 146 mmol/L 136 142 139  Potassium 3.5 - 5.3 mmol/L 3.6 4.2 4.6  Chloride 98 - 110 mmol/L 100 108 106  CO2 20 - 31 mmol/L 23 23 27   Calcium 8.6 - 10.2 mg/dL 8.9 9.3 9.3  Total Protein 6.1 - 8.1 g/dL 6.6 6.5 6.4  Total Bilirubin 0.2 - 1.2 mg/dL 0.5 0.6 0.4  Alkaline Phos 33 - 115 U/L 70 75 70  AST 10 - 35 U/L 18 29 13   ALT 6 - 29 U/L 13 29 11     Imaging: Xr Hip Unilat W Or W/o Pelvis 2-3 Views Right  Result Date: 01/02/2018 No joint space narrowing or erosive changes. No SI joint sclerosis.  Impression: Normal x-ray of the right hip.    Speciality Comments: No specialty comments available.    Procedures:  No procedures performed Allergies: Prochlorperazine; Ramipril; Shellfish-derived products; Atorvastatin; Compazine  [prochlorperazine edisylate]; Etanercept; Infliximab; Iohexol; Orencia [abatacept]; Shellfish allergy; Tofacitinib; Prochlorperazine edisylate; Amiodarone; and Rituximab   Assessment / Plan:     Visit Diagnoses: Rheumatoid arthritis of multiple sites with negative rheumatoid factor (HCC) - RF negative, anti-CCP negative, positive ANA. [place], [place], [place]. She has no synovitis on exam.  She states she continues to have significant joint stiffness.  She will continue on her current treatment regimen.    Psoriasis - She has mild psoriasis secondary to anti-TNF usage.  She has active psoriasis patches on bilateral knees.  She is using Clobetasol.    Pain in right hip - She has pain in her groin with right hip ROM.  A handout of hip exercises was provided.  A x-ray of her right hip was obtained today, which was normal. Plan: XR HIP  UNILAT W OR W/O PELVIS 2-3 VIEWS RIGHT  High risk medication use -She is on PLQ 200 mg one tablet in the morning and half a tablet in the evening, Simponi injectable, and Arava 20 mg daily.  CBC and CMP were drawn today.  She will have repeat labs every 3 months. Plan: CBC with Differential/Platelet,  COMPLETE METABOLIC PANEL WITH GFR   Osteopenia of multiple sites - She has not be taking calcium or vitamin D.  She was advised to restart taking these supplements.   Other fatigue: Chronic   Other insomnia: Chronic   History of vitamin D deficiency: She has not been taking Vitamin D.  She was advised to start taking vitamin D OTC.   Other medical conditions are listed as follows:   History of hyperlipidemia  History of diabetes mellitus - She is on insulin  History of hypertension  History of gastroesophageal reflux (GERD)  History of migraine  History of Graves' disease  History of coronary artery disease  Antiplatelet or antithrombotic long-term use - Secondary to coronary artery disease    Orders: Orders Placed This Encounter  Procedures  . XR HIP UNILAT W OR W/O PELVIS 2-3 VIEWS RIGHT  . CBC with Differential/Platelet  . COMPLETE METABOLIC PANEL WITH GFR   No orders of the defined types were placed in this encounter.    Follow-Up Instructions: Return in about 5 months (around 06/02/2018) for Rheumatoid arthritis. Bo Merino, MD  Note - This record has been created using Editor, commissioning.  Chart creation errors have been sought, but may not always  have been located. Such creation errors do not reflect on  the standard of medical care.

## 2018-01-03 NOTE — Progress Notes (Signed)
Labs are WNL.

## 2018-01-07 ENCOUNTER — Ambulatory Visit (INDEPENDENT_AMBULATORY_CARE_PROVIDER_SITE_OTHER): Payer: PRIVATE HEALTH INSURANCE | Admitting: Physical Therapy

## 2018-01-07 DIAGNOSIS — M545 Low back pain, unspecified: Secondary | ICD-10-CM

## 2018-01-07 DIAGNOSIS — M6281 Muscle weakness (generalized): Secondary | ICD-10-CM | POA: Diagnosis not present

## 2018-01-07 DIAGNOSIS — M62830 Muscle spasm of back: Secondary | ICD-10-CM

## 2018-01-07 NOTE — Patient Instructions (Addendum)
Trigger Point Dry Needling  . What is Trigger Point Dry Needling (DN)? o DN is a physical therapy technique used to treat muscle pain and dysfunction. Specifically, DN helps deactivate muscle trigger points (muscle knots).  o A thin filiform needle is used to penetrate the skin and stimulate the underlying trigger point. The goal is for a local twitch response (LTR) to occur and for the trigger point to relax. No medication of any kind is injected during the procedure.   . What Does Trigger Point Dry Needling Feel Like?  o The procedure feels different for each individual patient. Some patients report that they do not actually feel the needle enter the skin and overall the process is not painful. Very mild bleeding may occur. However, many patients feel a deep cramping in the muscle in which the needle was inserted. This is the local twitch response.   Marland Kitchen How Will I feel after the treatment? o Soreness is normal, and the onset of soreness may not occur for a few hours. Typically this soreness does not last longer than two days.  o Bruising is uncommon, however; ice can be used to decrease any possible bruising.  o In rare cases feeling tired or nauseous after the treatment is normal. In addition, your symptoms may get worse before they get better, this period will typically not last longer than 24 hours.   . What Can I do After My Treatment? o Increase your hydration by drinking more water for the next 24 hours. o You may place ice or heat on the areas treated that have become sore, however, do not use heat on inflamed or bruised areas. Heat often brings more relief post needling. o You can continue your regular activities, but vigorous activity is not recommended initially after the treatment for 24 hours. o DN is best combined with other physical therapy such as strengthening, stretching, and other therapies.  TENS stands for Transcutaneous Electrical Nerve Stimulation. In other words, electrical  impulses are allowed to pass through the skin in order to excite a nerve.   Purpose and Use of TENS:  TENS is a method used to manage acute and chronic pain without the use of drugs. It has been effective in managing pain associated with surgery, sprains, strains, trauma, rheumatoid arthritis, and neuralgias. It is a non-addictive, low risk, and non-invasive technique used to control pain. It is not, by any means, a curative form of treatment.   How TENS Works:  Most TENS units are a Paramedic unit powered by one 9 volt battery. Attached to the outside of the unit are two lead wires where two pins and/or snaps connect on each wire. All units come with a set of four reusable pads or electrodes. These are placed on the skin surrounding the area involved. By inserting the leads into  the pads, the electricity can pass from the unit making the circuit complete.  As the intensity is turned up slowly, the electrical current enters the body from the electrodes through the skin to the surrounding nerve fibers. This triggers the release of hormones from within the body. These hormones contain pain relievers. By increasing the circulation of these hormones, the person's pain may be lessened. It is also believed that the electrical stimulation itself helps to block the pain messages being sent to the brain, thus also decreasing the body's perception of pain.   Hazards:  TENS units are NOT to be used by patients with PACEMAKERS, DEFIBRILLATORS, DIABETIC PUMPS,  PREGNANT WOMEN, and patients with SEIZURE DISORDERS.  TENS units are NOT to be used over the heart, throat, brain, or spinal cord.  One of the major side effects from the TENS unit may be skin irritation. Some people may develop a rash if they are sensitive to the materials used in the electrodes or the connecting wires.   On/off wear time, 2:1 ratio.  Example on 30', off 15' alternate all day long. Keep it strong but not painful.   Avoid overuse  due the body getting used to the stem making it not as effective over time.    Knee-to-Chest Stretch: Bilateral    With hands behind knees, pull both knees in to chest until a comfortable stretch is felt in lower back and buttocks. Keep back relaxed. Hold __20-30__ seconds. Repeat __1__ times per set. Do _1___ sets per session. Do __2__ sessions per day.   Lower Trunk Rotation Stretch    Keeping back flat and feet together, rotate knees to left side. Hold _20___ seconds. Repeat __2__ times per set. Do __1__ sets per session. Do ___2_ sessions per day.  Cat / Cow Flow    Inhale, press spine toward ceiling like a Halloween cat. Keeping strength in arms and abdominals, exhale to soften spine through neutral and into cow pose. Open chest and arch back. Initiate movement between cat and cow at tailbone, one vertebrae at a time. Repeat __10__ times. Twice a day

## 2018-01-07 NOTE — Therapy (Signed)
[place], [place], [address] Phone: [phone]   Fax:  [phone]  Physical Therapy Evaluation  Patient Details  Name: [name] MRN: [id number] Date of Birth: 08-03-1966 Referring Provider: Fulton Mole PA   Encounter Date: 01/07/2018  PT End of Session - 01/07/18 0940    Visit Number  1    Number of Visits  8    Date for PT Re-Evaluation  02/04/18    PT Start Time  0940    PT Stop Time  1036    PT Time Calculation (min)  56 min    Activity Tolerance  Patient tolerated treatment well       Past Medical History:  Diagnosis Date  . ACUT MI ANTEROLAT WALL SUBSQT EPIS CARE   . Acute maxillary sinusitis   . ALLERGIC RHINITIS, SEASONAL   . ARTHRITIS, RHEUMATOID   . Atrial fibrillation ([place])    a. after CABG.  . CAD, ARTERY BYPASS GRAFT    a. DES to RCA in 2010 then LAD occlusion s/p CABG 3 06/07/2009 with LIMA to LAD, reverse SVG to D1, reverse SVG to distal RCA. b. Cath 05/08/2016 slightly hypodense region in the intermediate branch, however she had excellent flow, FFR was normal. Vein graft to PDA and the posterolateral branch is patent, patent LIMA to LAD, occluded SVG to diagonal.  . CARPAL TUNNEL SYNDROME, BILATERAL   . CHOLELITHIASIS   . Contrast media allergy   . DERMATITIS, ALLERGIC   . DIABETES MELLITUS, TYPE I    on insulin pump  . DIABETIC  RETINOPATHY   . Hiatal hernia   . HYPERLIPIDEMIA-MIXED   . HYPERTHYROIDISM   . Insulin pump in place   . MIGRAINE W/O AURA W/INTRACT W/STATUS MIGRAINOSUS 02/19/2008  . SINUS TACHYCARDIA 11/08/2010  . TRIGGER FINGER   . URI     Past Surgical History:  Procedure Laterality Date  . ABDOMINAL HYSTERECTOMY    . Caesarean section    . CARDIAC CATHETERIZATION N/A 05/08/2016   Procedure: Left Heart Cath and Cors/Grafts Angiography;  Surgeon: Burnell Blanks, MD;  Location: [facility];  Service: Cardiovascular;  Laterality: N/A;  .  CARPAL TUNNEL RELEASE    . CHOLECYSTECTOMY    . CORONARY ARTERY BYPASS GRAFT    . VITRECTOMY      There were no vitals filed for this visit.   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 She was seen by MD that week, they have been monitoring her.  She hasn't improved so they have referred her to PT.     How long can you sit comfortably?  worse than standing, tolerates about 5 '    How long can you walk comfortably?  no limitations - able to walk level for 30'     Diagnostic tests  none    Patient Stated Goals  get back to full duty work and not reinjure    Currently in Pain?  Yes    Pain Score  5     Pain Location  Back    Pain Orientation  Mid    Pain Descriptors / Indicators  Aching;Constant    Pain Type  Acute pain    Pain Radiating Towards  into Rt hip    Pain Onset  More than a month ago  Pain Frequency  Intermittent    Aggravating Factors   sitting, picking up heavy objects, carry groceries    Pain Relieving Factors  heat, ice         [facility] PT Assessment - 01/07/18 0001      Assessment   Medical Diagnosis  Lumbar strain    Referring Provider  Fulton Mole PA    Onset Date/Surgical Date  10/14/17    Next MD Visit  PRN after therapy    Prior Therapy  none      Precautions   Precautions  Other (comment)    Precaution Comments  no push/pull/ lift 25#      Balance Screen   Has the patient fallen in the past 6 months  No      [place] residence    [place]  Two level difficulty with stairs      Prior Function   Level of Independence  Independent    Vocation  Full time employment    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    Leisure  read, crafts, be with kids      Observation/Other Assessments   Focus on Therapeutic Outcomes (FOTO)   50% limited       Posture/Postural Control   Posture/Postural Control  Postural limitations    Postural Limitations  Rounded Shoulders;Forward head;Increased thoracic kyphosis      ROM / Strength   AROM / PROM / Strength  AROM;Strength      AROM   AROM Assessment Site  Lumbar    Lumbar Flexion  mid shin with pulling in low back     Lumbar Extension  25% limited compression pain in low back    Lumbar - Right Side Bend  WNL with pulling    Lumbar - Left Side Bend  75% present with pulling    Lumbar - Right Rotation  75% present with pain     Lumbar - Left Rotation  WNL      Strength   Strength Assessment Site  Hip;Knee;Ankle;Lumbar    Right/Left Hip  Right;Left    Right Hip Flexion  -- 5-/5    Right Hip Extension  3+/5    Right Hip ABduction  4-/5    Left Hip Flexion  5/5    Left Hip Extension  5/5    Left Hip ABduction  5/5    Right/Left Ankle  -- WNL    Lumbar Flexion  -- TA fair contraction     Lumbar Extension  -- multifidi fair      Flexibility   Soft Tissue Assessment /Muscle Length  yes    Hamstrings  WNL    Quadriceps  -- WNL      Palpation   Spinal mobility  hypomobile in lumbar and lower thoracic spine with tenderness greatest with CPA mobs at L1-T8    Palpation comment  tightness in bilat gluts/piriformis, tender on the Rt side and lumbar paraspinals       Special Tests   Other special tests  (-) lumbarspecial tests             Objective measurements completed on examination: See above findings.      [facility] Adult PT Treatment/Exercise - 01/07/18 0001      Exercises   Exercises  Lumbar      Lumbar Exercises: Stretches   Passive Hamstring Stretch Limitations  reviewed  her stretch    Double Knee to Chest Stretch  2 reps;20 seconds    Lower Trunk Rotation  2 reps;20 seconds    Quadruped Mid Back Stretch  -- 10 reps cat/com    Piriformis Stretch Limitations  reviewed her stretch      Modalities   Modalities  Electrical Stimulation;Moist Heat      Moist Heat  Therapy   Number Minutes Moist Heat  15 Minutes    Moist Heat Location  Lumbar Spine      Electrical Stimulation   Electrical Stimulation Location  lumbar    Electrical Stimulation Action  IFC - modulation    Electrical Stimulation Parameters  to tolerance    Electrical Stimulation Goals  Pain;Tone             PT Education - 01/07/18 1128    Education provided  Yes    Education Details  HEP, DN and home TENS    Person(s) Educated  Patient    Methods  Explanation;Demonstration;Handout    Comprehension  Returned demonstration;Verbalized understanding          PT Long Term Goals - 01/07/18 0934      PT LONG TERM GOAL #1   Title  I with advanced HEP ( 02/04/18)     Time  4    Period  Weeks    Status  New      PT LONG TERM GOAL #2   Title  improve FOTO =/< 41% limited ( 02/03/18)     Time  4    Period  Weeks    Status  New      PT LONG TERM GOAL #3   Title  improve Rt LE strength =/> 5-/5 to allow full return to work ( 02/04/18)     Time  4    Period  Weeks    Status  New      PT LONG TERM GOAL #4   Title  perform daily activities at work with no more than 2/10 back pain ( 02/04/18)     Time  4    Period  Weeks    Status  New      PT LONG TERM GOAL #5   Title  demo full lumbar ROM without pain to allow her to position herself for patient care at work ( 02/04/18)     Time  4    Period  Weeks    Status  New             Plan - 01/07/18 1128    Clinical Impression Statement  52 yo female ~ 2.5 months s/p low back strain at work.  She has failed conservative treatment and is now referred to PT. Jenet has a complicated medical history that has made healing difficult. 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 She has limited and painful lumbar ROM, multiple areas of muscular tightness in her low back and buttocks. Her Rt LE and core are weak as well. She would benefit from PT to decrease this tightness, restore ROM and function to allow her to return to  full duty.      Clinical Presentation  Stable    Clinical Decision Making  Low    Rehab Potential  Good    PT Frequency  2x / week    PT Duration  4 weeks    PT Treatment/Interventions  Dry needling;Manual techniques;Moist Heat;Ultrasound;Therapeutic activities;Patient/family education;Taping;Therapeutic exercise;Cryotherapy;Printmaker  PT Next Visit Plan  manual work low back and buttocks, DN if patient willing, modalitied PRN and initial core stability ex.     Consulted and Agree with Plan of Care  Patient       Patient will benefit from skilled therapeutic intervention in order to improve the following deficits and impairments:  Improper body mechanics, Pain, Increased muscle spasms, Decreased range of motion, Decreased strength, Hypomobility, Obesity  Visit Diagnosis: Pain, lumbar region - Plan: PT plan of care cert/re-cert  Muscle spasm of back - Plan: PT plan of care cert/re-cert  Muscle weakness (generalized) - Plan: PT plan of care cert/re-cert     Problem List Patient Active Problem List   Diagnosis Date Noted  . Vitamin D deficiency 07/04/2017  . B12 deficiency 07/04/2017  . Abnormal CT scan 06/19/2017  . Gastroesophageal reflux disease 06/19/2017  . Antiplatelet or antithrombotic long-term use 06/19/2017  . Sinus pressure 03/15/2017  . Nasal congestion 03/15/2017  . Graves disease 02/01/2017  . Sleep difficulties 02/01/2017  . No energy 02/01/2017  . Osteopenia 02/01/2017  . Hypertension, essential 05/08/2016  . IDDM (insulin dependent diabetes mellitus) ([place]) 05/08/2016  . Coronary artery disease involving native coronary artery of native heart with angina pectoris ([place])   . HYPERTHYROIDISM 11/09/2010  . SINUS TACHYCARDIA 11/08/2010  . SHORTNESS OF BREATH 11/08/2010  . DERMATITIS, ALLERGIC 07/20/2010  . EDEMA 07/12/2010  . DIZZINESS 11/14/2009  . ATRIAL FIBRILLATION 07/20/2009  . CAD, ARTERY BYPASS GRAFT 06/07/2009  . ANGINA,  STABLE/EXERTIONAL 06/02/2009  . HYPERLIPIDEMIA-MIXED 04/26/2009  . ACUT MI ANTEROLAT WALL SUBSQT EPIS CARE 04/26/2009  . CHEST PAIN 04/26/2009  . DIABETIC  RETINOPATHY 04/25/2009  . CARPAL TUNNEL SYNDROME, BILATERAL 04/25/2009  . TRIGGER FINGER 04/25/2009  . MIGRAINE W/O AURA W/INTRACT W/STATUS MIGRAINOSUS 02/19/2008  . ALLERGIC RHINITIS, SEASONAL 10/16/2006  . CHOLELITHIASIS 10/16/2006  . Rheumatoid arthritis ([place]) 10/16/2006    Jeral Pinch PT  01/07/2018, 11:42 AM  [facility] [place], [place], [address] Phone: [phone]   Fax:  [phone]  Name: [name] MRN: [id number] Date of Birth: [birth year]

## 2018-01-08 DIAGNOSIS — E1065 Type 1 diabetes mellitus with hyperglycemia: Secondary | ICD-10-CM | POA: Diagnosis not present

## 2018-01-08 DIAGNOSIS — E1039 Type 1 diabetes mellitus with other diabetic ophthalmic complication: Secondary | ICD-10-CM | POA: Diagnosis not present

## 2018-01-09 ENCOUNTER — Ambulatory Visit (INDEPENDENT_AMBULATORY_CARE_PROVIDER_SITE_OTHER): Payer: PRIVATE HEALTH INSURANCE | Admitting: Physical Therapy

## 2018-01-09 DIAGNOSIS — M6281 Muscle weakness (generalized): Secondary | ICD-10-CM

## 2018-01-09 DIAGNOSIS — M545 Low back pain, unspecified: Secondary | ICD-10-CM

## 2018-01-09 DIAGNOSIS — M62830 Muscle spasm of back: Secondary | ICD-10-CM

## 2018-01-09 NOTE — Patient Instructions (Signed)
     Lie with hips and knees bent. Slowly inhale, and then exhale. Pull navel toward spine and tighten pelvic floor. Hold for __10_ seconds. Continue to breathe in and out during hold. Rest for _10__ seconds. Repeat __10_ times. Do __2-3_ times a day.

## 2018-01-09 NOTE — Therapy (Signed)
[place], [place], [address] Phone: [phone]   Fax:  [phone]  Physical Therapy Treatment  Patient Details  Name: [name] MRN: [id number] Date of Birth: 1966/08/29 Referring Provider: Fulton Mole, PA   Encounter Date: 01/09/2018  PT End of Session - 01/09/18 1703    Visit Number  2    Number of Visits  8    Date for PT Re-Evaluation  02/04/18    PT Start Time  4709    PT Stop Time  1757    PT Time Calculation (min)  53 min    Activity Tolerance  Patient tolerated treatment well    Behavior During Therapy  [facility] for tasks assessed/performed       Past Medical History:  Diagnosis Date  . ACUT MI ANTEROLAT WALL SUBSQT EPIS CARE   . Acute maxillary sinusitis   . ALLERGIC RHINITIS, SEASONAL   . ARTHRITIS, RHEUMATOID   . Atrial fibrillation ([place])    a. after CABG.  . CAD, ARTERY BYPASS GRAFT    a. DES to RCA in 2010 then LAD occlusion s/p CABG 3 06/07/2009 with LIMA to LAD, reverse SVG to D1, reverse SVG to distal RCA. b. Cath 05/08/2016 slightly hypodense region in the intermediate branch, however she had excellent flow, FFR was normal. Vein graft to PDA and the posterolateral branch is patent, patent LIMA to LAD, occluded SVG to diagonal.  . CARPAL TUNNEL SYNDROME, BILATERAL   . CHOLELITHIASIS   . Contrast media allergy   . DERMATITIS, ALLERGIC   . DIABETES MELLITUS, TYPE I    on insulin pump  . DIABETIC  RETINOPATHY   . Hiatal hernia   . HYPERLIPIDEMIA-MIXED   . HYPERTHYROIDISM   . Insulin pump in place   . MIGRAINE W/O AURA W/INTRACT W/STATUS MIGRAINOSUS 02/19/2008  . SINUS TACHYCARDIA 11/08/2010  . TRIGGER FINGER   . URI     Past Surgical History:  Procedure Laterality Date  . ABDOMINAL HYSTERECTOMY    . Caesarean section    . CARDIAC CATHETERIZATION N/A 05/08/2016   Procedure: Left Heart Cath and Cors/Grafts Angiography;  Surgeon: Burnell Blanks, MD;  Location: [facility];  Service: Cardiovascular;  Laterality: N/A;  . CARPAL TUNNEL RELEASE    . CHOLECYSTECTOMY    . CORONARY ARTERY BYPASS GRAFT    . VITRECTOMY      There were no vitals filed for this visit.  Subjective Assessment - 01/09/18 1744    Subjective  Pt reports she feels the same as last visit 2 days ago.      Patient Stated Goals  get back to full duty work and not reinjure    Currently in Pain?  Yes    Pain Score  5     Pain Location  Back    Pain Orientation  Lower;Mid    Pain Descriptors / Indicators  Aching    Pain Radiating Towards  into Rt hip    Aggravating Factors   prolonged sitting     Pain Relieving Factors  heat/ice         [facility] PT Assessment - 01/09/18 0001      Assessment   Medical Diagnosis  Lumbar strain    Referring Provider  Fulton Mole, [place]    Onset Date/Surgical Date  10/14/17    Next MD Visit  PRN after therapy      Palpation   SI assessment   elevated Rt  ilium, Rt PSIS higher, slight Rt sacral torsion, Rt ASIS lower than Lt        [facility] Adult PT Treatment/Exercise - 01/09/18 0001      Self-Care   Self-Care  Other Self-Care Comments    Other Self-Care Comments   pt educated on MFR/self massage with ball to Rt hip flexor, demo provided.  Pt verbalized understanding.       Lumbar Exercises: Stretches   Passive Hamstring Stretch  2 reps;30 seconds supine with strap    Double Knee to Chest Stretch  3 reps;20 seconds    Lower Trunk Rotation  60 seconds rocking knees back and forth.     Hip Flexor Stretch  Right;2 reps;20 seconds leg off edge of table      Lumbar Exercises: Standing   Other Standing Lumbar Exercises  core engaged with sit to stand and with transitional movements throughout session.       Lumbar Exercises: Supine   Ab Set  10 reps;5 seconds    Other Supine Lumbar Exercises  educated pt on sit to/from supine via log roll; pt returned demo 3 x with VC      Moist Heat Therapy   Number Minutes Moist Heat  15 Minutes     Moist Heat Location  Lumbar Spine      Electrical Stimulation   Electrical Stimulation Location  bilat lumbar paraspinals and Rt post hip    Electrical Stimulation Action  IFC     Electrical Stimulation Parameters  to tolerance     Electrical Stimulation Goals  Pain      Manual Therapy   Manual Therapy  Muscle Energy Technique;Soft tissue mobilization    Soft tissue mobilization  STM to Rt glutes/hip rotators, Lt /Rt lumbar paraspinals.     Muscle Energy Technique  MET for elevated Rt ilium (couldn't tolerated Rt sidelying due to hip discomfort,so completed in Lt sidelying with hip abdct contract relax); MET for Rt ant rotated ilium with pt utilizing contract relax of hamstring while in standing x 5 reps; MET to correct slight Rt sacral torsion in prone.  Pt placed in hooklying and did bridge after each correction to reset hips.                    PT Long Term Goals - 01/07/18 0934      PT LONG TERM GOAL #1   Title  I with advanced HEP ( 02/04/18)     Time  4    Period  Weeks    Status  New      PT LONG TERM GOAL #2   Title  improve FOTO =/< 41% limited ( 02/03/18)     Time  4    Period  Weeks    Status  New      PT LONG TERM GOAL #3   Title  improve Rt LE strength =/> 5-/5 to allow full return to work ( 02/04/18)     Time  4    Period  Weeks    Status  New      PT LONG TERM GOAL #4   Title  perform daily activities at work with no more than 2/10 back pain ( 02/04/18)     Time  4    Period  Weeks    Status  New      PT LONG TERM GOAL #5   Title  demo full lumbar ROM without pain to allow her to position herself  for patient care at work ( 02/04/18)     Time  4    Period  Weeks    Status  New            Plan - 01/09/18 1745    Clinical Impression Statement  Pt presents with asymmetries in pelvis; improved with MET corrections.  Pt reported slight reduction in pain after corrections and further reduction with estim/MHP at end of session.  Progressing towards  goals.     Rehab Potential  Good    PT Frequency  2x / week    PT Duration  4 weeks    PT Treatment/Interventions  Dry needling;Manual techniques;Moist Heat;Ultrasound;Therapeutic activities;Patient/family education;Taping;Therapeutic exercise;Cryotherapy;Electrical Stimulation    PT Next Visit Plan  manual work low back and buttocks, DN if patient willing, progress core stability exercises.     Consulted and Agree with Plan of Care  Patient       Patient will benefit from skilled therapeutic intervention in order to improve the following deficits and impairments:  Improper body mechanics, Pain, Increased muscle spasms, Decreased range of motion, Decreased strength, Hypomobility, Obesity  Visit Diagnosis: Pain, lumbar region  Muscle spasm of back  Muscle weakness (generalized)     Problem List Patient Active Problem List   Diagnosis Date Noted  . Vitamin D deficiency 07/04/2017  . B12 deficiency 07/04/2017  . Abnormal CT scan 06/19/2017  . Gastroesophageal reflux disease 06/19/2017  . Antiplatelet or antithrombotic long-term use 06/19/2017  . Sinus pressure 03/15/2017  . Nasal congestion 03/15/2017  . Graves disease 02/01/2017  . Sleep difficulties 02/01/2017  . No energy 02/01/2017  . Osteopenia 02/01/2017  . Hypertension, essential 05/08/2016  . IDDM (insulin dependent diabetes mellitus) ([place]) 05/08/2016  . Coronary artery disease involving native coronary artery of native heart with angina pectoris ([place])   . HYPERTHYROIDISM 11/09/2010  . SINUS TACHYCARDIA 11/08/2010  . SHORTNESS OF BREATH 11/08/2010  . DERMATITIS, ALLERGIC 07/20/2010  . EDEMA 07/12/2010  . DIZZINESS 11/14/2009  . ATRIAL FIBRILLATION 07/20/2009  . CAD, ARTERY BYPASS GRAFT 06/07/2009  . ANGINA, STABLE/EXERTIONAL 06/02/2009  . HYPERLIPIDEMIA-MIXED 04/26/2009  . ACUT MI ANTEROLAT WALL SUBSQT EPIS CARE 04/26/2009  . CHEST PAIN 04/26/2009  . DIABETIC  RETINOPATHY 04/25/2009  . CARPAL TUNNEL SYNDROME,  BILATERAL 04/25/2009  . TRIGGER FINGER 04/25/2009  . MIGRAINE W/O AURA W/INTRACT W/STATUS MIGRAINOSUS 02/19/2008  . ALLERGIC RHINITIS, SEASONAL 10/16/2006  . CHOLELITHIASIS 10/16/2006  . Rheumatoid arthritis ([place]) 10/16/2006   Kerin Perna, PTA 01/09/18 5:54 PM  [place], [place], [address] Phone: [phone]   Fax:  [phone]  Name: SHANIYAH WIX MRN: [id number] Date of Birth: June 20, 1966

## 2018-01-13 ENCOUNTER — Encounter: Payer: Self-pay | Admitting: Rehabilitative and Restorative Service Providers"

## 2018-01-13 ENCOUNTER — Other Ambulatory Visit: Payer: Self-pay | Admitting: Rheumatology

## 2018-01-13 ENCOUNTER — Ambulatory Visit: Payer: 59 | Admitting: Rheumatology

## 2018-01-13 ENCOUNTER — Ambulatory Visit (INDEPENDENT_AMBULATORY_CARE_PROVIDER_SITE_OTHER): Payer: PRIVATE HEALTH INSURANCE | Admitting: Rehabilitative and Restorative Service Providers"

## 2018-01-13 DIAGNOSIS — M6281 Muscle weakness (generalized): Secondary | ICD-10-CM | POA: Diagnosis not present

## 2018-01-13 DIAGNOSIS — M62830 Muscle spasm of back: Secondary | ICD-10-CM | POA: Diagnosis not present

## 2018-01-13 DIAGNOSIS — M545 Low back pain, unspecified: Secondary | ICD-10-CM

## 2018-01-13 MED FILL — CYCLOBENZAPRINE HCL 10 MG T: 10 | 30 days supply | Qty: 30 | Fill #5

## 2018-01-13 MED FILL — HYDROXYCHLOROQUINE 200 MG T: 200 | 90 days supply | Qty: 135 | Fill #0

## 2018-01-13 MED FILL — LOSARTAN POTASSIUM 50 MG TA: 50 | 90 days supply | Qty: 90 | Fill #1

## 2018-01-13 MED FILL — SIMPONI 100 MG/ML SOAJ: 100 | 30 days supply | Qty: 1 | Fill #2

## 2018-01-13 MED FILL — NABUMETONE 750 MG TABLET: 750 | 30 days supply | Qty: 60 | Fill #0

## 2018-01-13 MED FILL — LEFLUNOMIDE 20 MG TABLET: 20 | 30 days supply | Qty: 30 | Fill #0

## 2018-01-13 MED FILL — PANTOPRAZOLE SOD DR 40 MG T: 40 | 90 days supply | Qty: 90 | Fill #2

## 2018-01-13 MED FILL — raNITIdine HCL 300 MG TABS: 300 | 30 days supply | Qty: 30 | Fill #3

## 2018-01-13 MED FILL — MONTELUKAST SOD 10 MG TAB: 10 | 30 days supply | Qty: 30 | Fill #2

## 2018-01-13 MED FILL — ROSUVASTATIN CALCIUM 10 MG: 10 | 90 days supply | Qty: 90 | Fill #2

## 2018-01-13 NOTE — Patient Instructions (Addendum)
Trigger Point Dry Needling  . What is Trigger Point Dry Needling (DN)? o DN is a physical therapy technique used to treat muscle pain and dysfunction. Specifically, DN helps deactivate muscle trigger points (muscle knots).  o A thin filiform needle is used to penetrate the skin and stimulate the underlying trigger point. The goal is for a local twitch response (LTR) to occur and for the trigger point to relax. No medication of any kind is injected during the procedure.   . What Does Trigger Point Dry Needling Feel Like?  o The procedure feels different for each individual patient. Some patients report that they do not actually feel the needle enter the skin and overall the process is not painful. Very mild bleeding may occur. However, many patients feel a deep cramping in the muscle in which the needle was inserted. This is the local twitch response.   Marland Kitchen How Will I feel after the treatment? o Soreness is normal, and the onset of soreness may not occur for a few hours. Typically this soreness does not last longer than two days.  o Bruising is uncommon, however; ice can be used to decrease any possible bruising.  o In rare cases feeling tired or nauseous after the treatment is normal. In addition, your symptoms may get worse before they get better, this period will typically not last longer than 24 hours.   . What Can I do After My Treatment? o Increase your hydration by drinking more water for the next 24 hours. o You may place ice or heat on the areas treated that have become sore, however, do not use heat on inflamed or bruised areas. Heat often brings more relief post needling. o You can continue your regular activities, but vigorous activity is not recommended initially after the treatment for 24 hours. o DN is best combined with other physical therapy such as strengthening, stretching, and other therapies.    Piriformis Stretch   Lying on back, pull right knee toward opposite shoulder.  Hold 30 seconds. Repeat 3 times. Do 2-3 sessions per day.

## 2018-01-13 NOTE — Therapy (Signed)
[facility] [place], [place], [address] Phone: [phone]   Fax:  [phone]  Physical Therapy Treatment  Patient Details  Name: [name] MRN: [id number] Date of Birth: Feb 18, 1966 Referring Provider: Fulton Mole, PA   Encounter Date: 01/13/2018  PT End of Session - 01/13/18 0806    Visit Number  3    Number of Visits  8    Date for PT Re-Evaluation  02/04/18    PT Start Time  0803    PT Stop Time  0900    PT Time Calculation (min)  57 min    Activity Tolerance  Patient tolerated treatment well       Past Medical History:  Diagnosis Date  . ACUT MI ANTEROLAT WALL SUBSQT EPIS CARE   . Acute maxillary sinusitis   . ALLERGIC RHINITIS, SEASONAL   . ARTHRITIS, RHEUMATOID   . Atrial fibrillation ([place])    a. after CABG.  . CAD, ARTERY BYPASS GRAFT    a. DES to RCA in 2010 then LAD occlusion s/p CABG 3 06/07/2009 with LIMA to LAD, reverse SVG to D1, reverse SVG to distal RCA. b. Cath 05/08/2016 slightly hypodense region in the intermediate branch, however she had excellent flow, FFR was normal. Vein graft to PDA and the posterolateral branch is patent, patent LIMA to LAD, occluded SVG to diagonal.  . CARPAL TUNNEL SYNDROME, BILATERAL   . CHOLELITHIASIS   . Contrast media allergy   . DERMATITIS, ALLERGIC   . DIABETES MELLITUS, TYPE I    on insulin pump  . DIABETIC  RETINOPATHY   . Hiatal hernia   . HYPERLIPIDEMIA-MIXED   . HYPERTHYROIDISM   . Insulin pump in place   . MIGRAINE W/O AURA W/INTRACT W/STATUS MIGRAINOSUS 02/19/2008  . SINUS TACHYCARDIA 11/08/2010  . TRIGGER FINGER   . URI     Past Surgical History:  Procedure Laterality Date  . ABDOMINAL HYSTERECTOMY    . Caesarean section    . CARDIAC CATHETERIZATION N/A 05/08/2016   Procedure: Left Heart Cath and Cors/Grafts Angiography;  Surgeon: Burnell Blanks, MD;  Location: [facility];  Service: Cardiovascular;  Laterality: N/A;   . CARPAL TUNNEL RELEASE    . CHOLECYSTECTOMY    . CORONARY ARTERY BYPASS GRAFT    . VITRECTOMY      There were no vitals filed for this visit.  Subjective Assessment - 01/13/18 0807    Subjective  May be a little better. Working on her exercises at home. Mid back and Rt hip are still hurting.     Currently in Pain?  Yes    Pain Score  4     Pain Location  Back    Pain Orientation  Lower;Mid    Pain Descriptors / Indicators  Aching    Pain Type  Acute pain    Pain Radiating Towards  into Rt hip     Pain Onset  More than a month ago    Pain Frequency  Intermittent    Aggravating Factors   prolonged sitting     Pain Relieving Factors  heat/ice                      [facility] Adult PT Treatment/Exercise - 01/13/18 0001      Lumbar Exercises: Stretches   Passive Hamstring Stretch  2 reps;30 seconds supine with strap    Double Knee to Chest Stretch  3 reps;20 seconds  Piriformis Stretch  3 reps;30 seconds trial of travell stretch       Lumbar Exercises: Aerobic   Stationary Bike  Nustep L5 x 5 min       Lumbar Exercises: Supine   Ab Set  10 reps;5 seconds      Moist Heat Therapy   Number Minutes Moist Heat  20 Minutes    Moist Heat Location  Lumbar Spine      Electrical Stimulation   Electrical Stimulation Location  bilat lumbar paraspinals and Rt post hip    Electrical Stimulation Action  IFC    Electrical Stimulation Parameters  to tolerance    Electrical Stimulation Goals  Tone;Pain      Manual Therapy   Manual therapy comments  pt prone     Soft tissue mobilization  deep tissue work through Rt glutes/hip rotators, Rt lumbar paraspinals/QL.        Trigger Point Dry Needling - 01/13/18 1308    Consent Given?  Yes    Education Handout Provided  Yes    Muscles Treated Lower Body  -- Rt lumbar paraspinals/QL - estim with DN     Gluteus Maximus Response  Palpable increased muscle length    Gluteus Minimus Response  Palpable increased muscle length     Piriformis Response  Palpable increased muscle length           PT Education - 01/13/18 0811    Education provided  Yes    Education Details  DN    Person(s) Educated  Patient    Methods  Explanation    Comprehension  Verbalized understanding          PT Long Term Goals - 01/13/18 0806      PT LONG TERM GOAL #1   Title  I with advanced HEP ( 02/04/18)     Time  4    Period  Weeks    Status  On-going      PT LONG TERM GOAL #2   Title  improve FOTO =/< 41% limited ( 02/03/18)     Time  4    Period  Weeks    Status  On-going      PT LONG TERM GOAL #3   Title  improve Rt LE strength =/> 5-/5 to allow full return to work ( 02/04/18)     Time  4    Period  Weeks    Status  On-going      PT LONG TERM GOAL #4   Title  perform daily activities at work with no more than 2/10 back pain ( 02/04/18)     Time  4    Period  Weeks    Status  On-going      PT LONG TERM GOAL #5   Title  demo full lumbar ROM without pain to allow her to position herself for patient care at work ( 02/04/18)     Time  4    Period  Weeks    Status  On-going            Plan - 01/13/18 6578    Clinical Impression Statement  Patient tolerated DN fairly well. OK with needling but not as much with pistoning. Toleratd estim with DN well. Note improved tissue extensibility with DN and manual work, Progressing gradually toward stated goals of therapy.     Rehab Potential  Good    PT Frequency  2x / week    PT Duration  4  weeks    PT Treatment/Interventions  Dry needling;Manual techniques;Moist Heat;Ultrasound;Therapeutic activities;Patient/family education;Taping;Therapeutic exercise;Cryotherapy;Electrical Stimulation    PT Next Visit Plan  manual work low back and buttocks, assess response to DN; progress core stability exercises.     Consulted and Agree with Plan of Care  Patient       Patient will benefit from skilled therapeutic intervention in order to improve the following deficits and  impairments:  Improper body mechanics, Pain, Increased muscle spasms, Decreased range of motion, Decreased strength, Hypomobility, Obesity  Visit Diagnosis: Pain, lumbar region  Muscle spasm of back  Muscle weakness (generalized)     Problem List Patient Active Problem List   Diagnosis Date Noted  . Vitamin D deficiency 07/04/2017  . B12 deficiency 07/04/2017  . Abnormal CT scan 06/19/2017  . Gastroesophageal reflux disease 06/19/2017  . Antiplatelet or antithrombotic long-term use 06/19/2017  . Sinus pressure 03/15/2017  . Nasal congestion 03/15/2017  . Graves disease 02/01/2017  . Sleep difficulties 02/01/2017  . No energy 02/01/2017  . Osteopenia 02/01/2017  . Hypertension, essential 05/08/2016  . IDDM (insulin dependent diabetes mellitus) ([place]) 05/08/2016  . Coronary artery disease involving native coronary artery of native heart with angina pectoris ([place])   . HYPERTHYROIDISM 11/09/2010  . SINUS TACHYCARDIA 11/08/2010  . SHORTNESS OF BREATH 11/08/2010  . DERMATITIS, ALLERGIC 07/20/2010  . EDEMA 07/12/2010  . DIZZINESS 11/14/2009  . ATRIAL FIBRILLATION 07/20/2009  . CAD, ARTERY BYPASS GRAFT 06/07/2009  . ANGINA, STABLE/EXERTIONAL 06/02/2009  . HYPERLIPIDEMIA-MIXED 04/26/2009  . ACUT MI ANTEROLAT WALL SUBSQT EPIS CARE 04/26/2009  . CHEST PAIN 04/26/2009  . DIABETIC  RETINOPATHY 04/25/2009  . CARPAL TUNNEL SYNDROME, BILATERAL 04/25/2009  . TRIGGER FINGER 04/25/2009  . MIGRAINE W/O AURA W/INTRACT W/STATUS MIGRAINOSUS 02/19/2008  . ALLERGIC RHINITIS, SEASONAL 10/16/2006  . CHOLELITHIASIS 10/16/2006  . Rheumatoid arthritis ([place]) 10/16/2006    Quilla Freeze Nilda Simmer PT, MPH  01/13/2018, 8:40 AM  [facility] [place], [place], [address] Phone: [phone]   Fax:  [phone]  Name: [name] MRN: [id number] Date of Birth: 1966/12/04

## 2018-01-13 NOTE — Telephone Encounter (Signed)
Last Visit: 01/02/18 Next Visit: 06/23/18 Labs: 01/02/18 WNL PLQ Eye Exam: 12/2017 WNL  Okay to refill per Dr. Estanislado Pandy

## 2018-01-14 MED FILL — CONTOUR NEXT STRIPS: 34 days supply | Qty: 200 | Fill #3

## 2018-01-15 ENCOUNTER — Ambulatory Visit (INDEPENDENT_AMBULATORY_CARE_PROVIDER_SITE_OTHER): Payer: PRIVATE HEALTH INSURANCE | Admitting: Physical Therapy

## 2018-01-15 ENCOUNTER — Ambulatory Visit (INDEPENDENT_AMBULATORY_CARE_PROVIDER_SITE_OTHER): Payer: 59 | Admitting: Physician Assistant

## 2018-01-15 ENCOUNTER — Encounter: Payer: Self-pay | Admitting: Physician Assistant

## 2018-01-15 VITALS — BP 132/60 | HR 73 | Ht 63.0 in | Wt 174.0 lb

## 2018-01-15 DIAGNOSIS — R059 Cough, unspecified: Secondary | ICD-10-CM

## 2018-01-15 DIAGNOSIS — M6281 Muscle weakness (generalized): Secondary | ICD-10-CM | POA: Diagnosis not present

## 2018-01-15 DIAGNOSIS — R05 Cough: Secondary | ICD-10-CM

## 2018-01-15 DIAGNOSIS — M545 Low back pain, unspecified: Secondary | ICD-10-CM

## 2018-01-15 DIAGNOSIS — M62830 Muscle spasm of back: Secondary | ICD-10-CM

## 2018-01-15 MED ORDER — IPRATROPIUM-ALBUTEROL 0.5-2.5 (3) MG/3ML IN SOLN
3.0000 mL | Freq: Once | RESPIRATORY_TRACT | Status: AC
  Administered 2018-01-15: 3 mL via RESPIRATORY_TRACT

## 2018-01-15 MED ORDER — ALBUTEROL SULFATE HFA 108 (90 BASE) MCG/ACT IN AERS: 2.0000 | INHALATION_SPRAY | RESPIRATORY_TRACT | 1 refills | Status: DC | PRN

## 2018-01-15 MED ORDER — METHYLPREDNISOLONE SODIUM SUCC 125 MG IJ SOLR
125.0000 mg | Freq: Once | INTRAMUSCULAR | Status: AC
  Administered 2018-01-15: 125 mg via INTRAMUSCULAR

## 2018-01-15 MED FILL — HumaLOG 100 UNIT/ML SOLN: 100 | 22 days supply | Qty: 20 | Fill #2

## 2018-01-15 MED FILL — VENTOLIN HFA 90 MCG INHALER: 108 (90 BAS | 25 days supply | Qty: 18 | Fill #0

## 2018-01-15 NOTE — Patient Instructions (Signed)
Chemical Inhalation Injury A chemical inhalation injury is an internal injury, such as lung damage, that results from breathing in fumes of a chemical or harmful substance (toxic agent). Chemical inhalation injuries most often occur:  During fires, when materials that are burned release chemicals into the environment.  During work accidents, when large quantities of toxic chemicals are spilled at factories or industrial sites. Chemical inhalation injuries vary in severity. An injury tends to be more severe:  The more acidic or alkaline the chemical is.  The more concentrated the substance is.  The longer you are exposed to the substance. RISK FACTORS You are at a high risk for a chemical inhalation injury if you:  Are exposed to burning materials.  Work with chemicals, solvents, or cleaners. SIGNS AND SYMPTOMS Symptoms of a chemical inhalation injury may include:  Hoarse voice.  Shortness of breath or trouble breathing.  Chest pain.  Pale or blue skin.  Mucus production.  Cough.  Weakness.  Dizziness or fainting. DIAGNOSIS Most chemical inhalation injuries can be diagnosed with a physical exam and medical history. Tests may be done to check for lung damage. They may include:  A blood oxygen level test.  A chest X-ray.  Pulmonary function tests. There are no tests to identify the specific chemical or substance that caused the injury. TREATMENT  There is no specific treatment for a chemical inhalation injury. Most treatment is directed at improving the ability of the lungs to deliver oxygen to the body. Time is needed for lung tissue to heal. Supportive treatment may include:  Aerosol treatments to decrease swelling in the airways.  Suctioning of the airways to remove excess mucus.  Supplemental oxygen. HOME CARE INSTRUCTIONS  Do not use any tobacco products, including cigarettes, chewing tobacco, or electronic cigarettes. If you need help quitting, ask your  health care provider.  Do not allow yourself to be exposed to any airway irritants, such as cigarette smoke or smoke from a fireplace.  Follow your health care provider's instructions for the use of any inhalers.  Take medicines only as directed by your health care provider.  Keep all follow-up visits as directed by your health care provider. This is important. SEEK MEDICAL CARE IF:  Your symptoms are not improving as your health care provider predicted. SEEK IMMEDIATE MEDICAL CARE IF:  Your symptoms get worse.  You have increasing shortness of breath or wheezing.  Your skin or your lips appear very pale or blue.  You have a persistent cough.  You cough up blood or dark material.  You have chest pain or weakness.  You have a fever.  You faint. This information is not intended to replace advice given to you by your health care provider. Make sure you discuss any questions you have with your health care provider. Document Released: 08/20/2014 Document Reviewed: 08/20/2014 Elsevier Interactive Patient Education  2017 Elsevier Inc.  

## 2018-01-15 NOTE — Therapy (Signed)
[place], [place], [address] Phone: [phone]   Fax:  [phone]  Physical Therapy Treatment  Patient Details  Name: [name] MRN: [id number] Date of Birth: [birth year] Referring Provider: Fulton Mole, PA   Encounter Date: 01/15/2018  PT End of Session - 01/15/18 1432    Visit Number  4    Number of Visits  8    Date for PT Re-Evaluation  02/04/18    PT Start Time  1402    PT Stop Time  1446    PT Time Calculation (min)  44 min    Activity Tolerance  Patient limited by pain    Behavior During Therapy  [facility] for tasks assessed/performed       Past Medical History:  Diagnosis Date  . ACUT MI ANTEROLAT WALL SUBSQT EPIS CARE   . Acute maxillary sinusitis   . ALLERGIC RHINITIS, SEASONAL   . ARTHRITIS, RHEUMATOID   . Atrial fibrillation ([place])    a. after CABG.  . CAD, ARTERY BYPASS GRAFT    a. DES to RCA in 2010 then LAD occlusion s/p CABG 3 06/07/2009 with LIMA to LAD, reverse SVG to D1, reverse SVG to distal RCA. b. Cath 05/08/2016 slightly hypodense region in the intermediate branch, however she had excellent flow, FFR was normal. Vein graft to PDA and the posterolateral branch is patent, patent LIMA to LAD, occluded SVG to diagonal.  . CARPAL TUNNEL SYNDROME, BILATERAL   . CHOLELITHIASIS   . Contrast media allergy   . DERMATITIS, ALLERGIC   . DIABETES MELLITUS, TYPE I    on insulin pump  . DIABETIC  RETINOPATHY   . Hiatal hernia   . HYPERLIPIDEMIA-MIXED   . HYPERTHYROIDISM   . Insulin pump in place   . MIGRAINE W/O AURA W/INTRACT W/STATUS MIGRAINOSUS 02/19/2008  . SINUS TACHYCARDIA 11/08/2010  . TRIGGER FINGER   . URI     Past Surgical History:  Procedure Laterality Date  . ABDOMINAL HYSTERECTOMY    . Caesarean section    . CARDIAC CATHETERIZATION N/A 05/08/2016   Procedure: Left Heart Cath and Cors/Grafts Angiography;  Surgeon: Burnell Blanks, MD;  Location: [facility];  Service: Cardiovascular;  Laterality: N/A;  . CARPAL TUNNEL RELEASE    . CHOLECYSTECTOMY    . CORONARY ARTERY BYPASS GRAFT    . VITRECTOMY      There were no vitals filed for this visit.  Subjective Assessment - 01/15/18 1410    Subjective  Pt reports she was a little sore from DN last session when she went to work yesterday.  She had to hold clamps during surgery for 45 min (demonstrates Lt trunk flexion with arms overhead); she believes this is cause of her pain today.      Patient Stated Goals  get back to full duty work and not reinjure    Currently in Pain?  Yes    Pain Score  5     Pain Location  Back    Pain Orientation  Mid    Pain Descriptors / Indicators  Aching    Aggravating Factors   positions held during work; prolonged sitting    Pain Relieving Factors  heat          [facility] PT Assessment - 01/15/18 0001      Assessment   Medical Diagnosis  Lumbar strain    Referring Provider  Fulton Mole, PA    Onset Date/Surgical Date  10/14/17    Next MD Visit  PRN after therapy      [facility] Adult PT Treatment/Exercise - 01/15/18 0001      Lumbar Exercises: Stretches   Passive Hamstring Stretch  Right;Left;3 reps;20 seconds    Double Knee to Chest Stretch  3 reps;20 seconds    Piriformis Stretch  Right;Left;1 rep;30 seconds      Lumbar Exercises: Aerobic   Stationary Bike  Nustep L5 x 5 min (arms/legs)      Moist Heat Therapy   Number Minutes Moist Heat  15 Minutes    Moist Heat Location  Lumbar Spine      Electrical Stimulation   Electrical Stimulation Location  bilat lumbar paraspinals     Electrical Stimulation Action  IFC    Electrical Stimulation Parameters  to tolerance     Electrical Stimulation Goals  Tone;Pain      Manual Therapy   Soft tissue mobilization  STM to Rt glutes/hip rotators, Rt thoracic / lumbar paraspinals.     Muscle Energy Technique  MET for elevated Rt ilium (trial in Rt sidelying; repeated in Lt sidelying with hip abdct contract  relax); MET for Rt ant rotated ilium with pt utilizing contract relax of hamstring while in supine x 5 reps; MET to correct slight Rt sacral torsion in prone.  Pt placed in hooklying and did bridge after each correction to reset hips.                    PT Long Term Goals - 01/13/18 3818      PT LONG TERM GOAL #1   Title  I with advanced HEP ( 02/04/18)     Time  4    Period  Weeks    Status  On-going      PT LONG TERM GOAL #2   Title  improve FOTO =/< 41% limited ( 02/03/18)     Time  4    Period  Weeks    Status  On-going      PT LONG TERM GOAL #3   Title  improve Rt LE strength =/> 5-/5 to allow full return to work ( 02/04/18)     Time  4    Period  Weeks    Status  On-going      PT LONG TERM GOAL #4   Title  perform daily activities at work with no more than 2/10 back pain ( 02/04/18)     Time  4    Period  Weeks    Status  On-going      PT LONG TERM GOAL #5   Title  demo full lumbar ROM without pain to allow her to position herself for patient care at work ( 02/04/18)     Time  4    Period  Weeks    Status  On-going            Plan - 01/15/18 1719    Clinical Impression Statement  Pt's pelvis alignment slightly off, not as much as last assessment; improved with MET.  She had difficulty tolerating Rt sidelying.  Point tender spot in Rt lower thoracic paraspinals. Pt reported slight decrease in pain at end of session.  No new goals met due to back flare up from prolonged posture during work hours.     PT Next Visit Plan  manual work low back and buttocks, assess pelvic alignment; progress core stability exercises.     Consulted and Agree with  Plan of Care  Patient       Patient will benefit from skilled therapeutic intervention in order to improve the following deficits and impairments:  Improper body mechanics, Pain, Increased muscle spasms, Decreased range of motion, Decreased strength, Hypomobility, Obesity  Visit Diagnosis: Pain, lumbar region  Muscle  spasm of back  Muscle weakness (generalized)     Problem List Patient Active Problem List   Diagnosis Date Noted  . Vitamin D deficiency 07/04/2017  . B12 deficiency 07/04/2017  . Abnormal CT scan 06/19/2017  . Gastroesophageal reflux disease 06/19/2017  . Antiplatelet or antithrombotic long-term use 06/19/2017  . Sinus pressure 03/15/2017  . Nasal congestion 03/15/2017  . Graves disease 02/01/2017  . Sleep difficulties 02/01/2017  . No energy 02/01/2017  . Osteopenia 02/01/2017  . Hypertension, essential 05/08/2016  . IDDM (insulin dependent diabetes mellitus) ([place]) 05/08/2016  . Coronary artery disease involving native coronary artery of native heart with angina pectoris ([place])   . HYPERTHYROIDISM 11/09/2010  . SINUS TACHYCARDIA 11/08/2010  . SHORTNESS OF BREATH 11/08/2010  . DERMATITIS, ALLERGIC 07/20/2010  . EDEMA 07/12/2010  . DIZZINESS 11/14/2009  . ATRIAL FIBRILLATION 07/20/2009  . CAD, ARTERY BYPASS GRAFT 06/07/2009  . ANGINA, STABLE/EXERTIONAL 06/02/2009  . HYPERLIPIDEMIA-MIXED 04/26/2009  . ACUT MI ANTEROLAT WALL SUBSQT EPIS CARE 04/26/2009  . CHEST PAIN 04/26/2009  . DIABETIC  RETINOPATHY 04/25/2009  . CARPAL TUNNEL SYNDROME, BILATERAL 04/25/2009  . TRIGGER FINGER 04/25/2009  . MIGRAINE W/O AURA W/INTRACT W/STATUS MIGRAINOSUS 02/19/2008  . ALLERGIC RHINITIS, SEASONAL 10/16/2006  . CHOLELITHIASIS 10/16/2006  . Rheumatoid arthritis ([place]) 10/16/2006   Kerin Perna, PTA 01/15/18 5:27 PM  [facility] [place], [place], [address] Phone: [phone]   Fax:  [phone]  Name: [name] MRN: [id number] Date of Birth: Feb 13, 1966

## 2018-01-15 NOTE — Progress Notes (Signed)
   Subjective:    Patient ID: [name], female    DOB: [birth year], 52 y.o.   MRN: [id number]  HPI  Patient is a 52 year old female with a history of rheumatoid arthritis is presenting with persistent cough and a sensation of heaviness in the chest. She was cleaning out her drain with Liquid Fire drain cleaner (sulfuric acid) last night 01/14/18 . She inhaled some of the Liquid Fire fumes and It caused her to cough excessively to the point she vomited. Today, she has continued to have a persistent cough and sensation of heaviness. She has produced some clear mucous. Denies shortness of breath.    Review of Systems  Constitutional: Positive for chills. Negative for fatigue and fever.  HENT: Positive for rhinorrhea. Negative for sinus pressure and sinus pain.   Respiratory: Positive for cough. Negative for chest tightness and shortness of breath.        Objective:   Physical Exam  Constitutional: She is oriented to person, place, and time. She appears well-developed and well-nourished. No distress.  HENT:  Head: Normocephalic and atraumatic.  Right Ear: External ear normal.  Left Ear: External ear normal.  Cardiovascular: Normal rate, regular rhythm and normal heart sounds. Exam reveals no gallop and no friction rub.  No murmur heard. Pulmonary/Chest: Effort normal and breath sounds normal.  Neurological: She is alert and oriented to person, place, and time.  Psychiatric: She has a normal mood and affect. Her behavior is normal.    Vitals:   01/15/18 1058  BP: 132/60  Pulse: 73  SpO2: 100%      Assessment & Plan:  Diagnoses and all orders for this visit:  Cough -     ipratropium-albuterol (DUONEB) 0.5-2.5 (3) MG/3ML nebulizer solution 3 mL -     methylPREDNISolone sodium succinate (SOLU-MEDROL) 125 mg/2 mL injection 125 mg  Other orders -     albuterol (PROVENTIL HFA;VENTOLIN HFA) 108 (90 Base) MCG/ACT inhaler; Inhale 2 puffs into the lungs every 4 (four) hours as  needed. For cough/SOB.    We discussed the inflammation of [name]'s lungs due to the inhalation of the drain cleaner. She was given a duo-neb and solu-medrol injection to decrease the inflammation caused by the irritant. She will also be given a prescription for an albuterol inhaler in case of worsening symptoms if she chooses to fill it. She is welcome to call the office or send a myChart message with questions or concerns.

## 2018-01-16 ENCOUNTER — Encounter: Payer: Self-pay | Admitting: Physical Therapy

## 2018-01-20 ENCOUNTER — Ambulatory Visit (INDEPENDENT_AMBULATORY_CARE_PROVIDER_SITE_OTHER): Payer: PRIVATE HEALTH INSURANCE | Admitting: Rehabilitative and Restorative Service Providers"

## 2018-01-20 ENCOUNTER — Telehealth: Payer: Self-pay

## 2018-01-20 ENCOUNTER — Encounter: Payer: Self-pay | Admitting: Rehabilitative and Restorative Service Providers"

## 2018-01-20 DIAGNOSIS — M6281 Muscle weakness (generalized): Secondary | ICD-10-CM | POA: Diagnosis not present

## 2018-01-20 DIAGNOSIS — M62830 Muscle spasm of back: Secondary | ICD-10-CM

## 2018-01-20 DIAGNOSIS — M545 Low back pain, unspecified: Secondary | ICD-10-CM

## 2018-01-20 NOTE — Telephone Encounter (Signed)
Received a prior authorization request for Simponi from [place]. Authorization was submitted to insurance via cover my meds. Will update once we receive a response.   Cyriah Childrey, [place], CPhT 9:17 AM

## 2018-01-20 NOTE — Patient Instructions (Signed)
Cat / Cow Flow    Inhale, press spine toward ceiling like a Halloween cat. Keeping strength in arms and abdominals, exhale to soften spine through neutral and into cow pose. Open chest and arch back. Initiate movement between cat and cow at tailbone, one vertebrae at a time. Repeat __5__ times.   BACK: Child's Pose (Sciatica)    Sit in knee-chest position and reach arms forward. Separate knees for comfort. Hold position for _20-30 sec  Repeat _3-5__ times. Do _2-3__ times per day.

## 2018-01-20 NOTE — Therapy (Signed)
[place], [place], [address] Phone: [phone]   Fax:  [phone]  Physical Therapy Treatment  Patient Details  Name: [name] MRN: [id number] Date of Birth: 11-22-1966 Referring Provider: Fulton Mole, PA   Encounter Date: 01/20/2018  PT End of Session - 01/20/18 1019    Visit Number  5    Number of Visits  8    Date for PT Re-Evaluation  02/04/18    PT Start Time  1017    PT Stop Time  1114    PT Time Calculation (min)  57 min    Activity Tolerance  Patient tolerated treatment well       Past Medical History:  Diagnosis Date  . ACUT MI ANTEROLAT WALL SUBSQT EPIS CARE   . Acute maxillary sinusitis   . ALLERGIC RHINITIS, SEASONAL   . ARTHRITIS, RHEUMATOID   . Atrial fibrillation ([place])    a. after CABG.  . CAD, ARTERY BYPASS GRAFT    a. DES to RCA in 2010 then LAD occlusion s/p CABG 3 06/07/2009 with LIMA to LAD, reverse SVG to D1, reverse SVG to distal RCA. b. Cath 05/08/2016 slightly hypodense region in the intermediate branch, however she had excellent flow, FFR was normal. Vein graft to PDA and the posterolateral branch is patent, patent LIMA to LAD, occluded SVG to diagonal.  . CARPAL TUNNEL SYNDROME, BILATERAL   . CHOLELITHIASIS   . Contrast media allergy   . DERMATITIS, ALLERGIC   . DIABETES MELLITUS, TYPE I    on insulin pump  . DIABETIC  RETINOPATHY   . Hiatal hernia   . HYPERLIPIDEMIA-MIXED   . HYPERTHYROIDISM   . Insulin pump in place   . MIGRAINE W/O AURA W/INTRACT W/STATUS MIGRAINOSUS 02/19/2008  . SINUS TACHYCARDIA 11/08/2010  . TRIGGER FINGER   . URI     Past Surgical History:  Procedure Laterality Date  . ABDOMINAL HYSTERECTOMY    . Caesarean section    . CARDIAC CATHETERIZATION N/A 05/08/2016   Procedure: Left Heart Cath and Cors/Grafts Angiography;  Surgeon: Burnell Blanks, MD;  Location: [facility];  Service: Cardiovascular;  Laterality: N/A;   . CARPAL TUNNEL RELEASE    . CHOLECYSTECTOMY    . CORONARY ARTERY BYPASS GRAFT    . VITRECTOMY      There were no vitals filed for this visit.  Subjective Assessment - 01/20/18 1020    Subjective  Patient reports that she is at a stand still with LBP. She will start feeling better and then do something at work and irritate it all over again. She will talk with supervisor and see if there is another area she could work in that would not irritate the back pain. Patient did receive her TENS unit and is using that at home. It helps with pain management     Currently in Pain?  Yes    Pain Score  5     Pain Location  Back    Pain Orientation  Mid    Pain Descriptors / Indicators  Aching    Pain Type  Acute pain    Pain Onset  More than a month ago    Pain Frequency  Intermittent                      [facility] Adult PT Treatment/Exercise - 01/20/18 0001      Lumbar Exercises: Stretches   Passive Hamstring Stretch  Right;Left;3 reps;20 seconds    Double Knee to Chest Stretch  3 reps;20 seconds    Piriformis Stretch  Right;Left;1 rep;30 seconds      Lumbar Exercises: Aerobic   Stationary Bike  Nustep L5 x 5 min (arms/legs)      Lumbar Exercises: Quadruped   Madcat/Old Horse  5 reps    Other Quadruped Lumbar Exercises  sit back into childs pose 20 sec x 4 reps       Moist Heat Therapy   Number Minutes Moist Heat  20 Minutes    Moist Heat Location  Lumbar Spine      Electrical Stimulation   Electrical Stimulation Location  Rt lumbar and posterior hip/piriformis    Electrical Stimulation Action  IFC    Electrical Stimulation Parameters  to tolerance     Electrical Stimulation Goals  Tone;Pain      Manual Therapy   Manual therapy comments  pt prone     Soft tissue mobilization  STM to Rt glutes/hip rotators, Rt thoracic / lumbar paraspinals.        Trigger Point Dry Needling - 01/20/18 1037    Consent Given?  Yes    Muscles Treated Lower Body  -- Rt lumbar  paraspinals/QL with estim     Gluteus Maximus Response  Palpable increased muscle length    Gluteus Minimus Response  Palpable increased muscle length    Piriformis Response  Palpable increased muscle length           PT Education - 01/20/18 1107    Education provided  Yes    Education Details  HEP     Person(s) Educated  Patient    Methods  Explanation;Demonstration;Tactile cues;Verbal cues;Handout    Comprehension  Verbalized understanding;Returned demonstration;Verbal cues required;Tactile cues required          PT Long Term Goals - 01/20/18 1019      PT LONG TERM GOAL #1   Title  I with advanced HEP ( 02/04/18)     Time  4    Period  Weeks    Status  On-going      PT LONG TERM GOAL #2   Title  improve FOTO =/< 41% limited ( 02/03/18)     Time  4    Period  Weeks    Status  On-going      PT LONG TERM GOAL #3   Title  improve Rt LE strength =/> 5-/5 to allow full return to work ( 02/04/18)     Time  4    Period  Weeks    Status  On-going      PT LONG TERM GOAL #4   Title  perform daily activities at work with no more than 2/10 back pain ( 02/04/18)     Time  4    Period  Weeks    Status  On-going      PT LONG TERM GOAL #5   Title  demo full lumbar ROM without pain to allow her to position herself for patient care at work ( 02/04/18)     Time  4    Period  Weeks    Status  On-going            Plan - 01/20/18 1038    Clinical Impression Statement  Persistent tightness through Rt lumbar and posterior hip musculature on Rt. Good release of tightness with manual work and DN. Patient willl talk with supervisor about a different light duty assignment  to decrease stress on LB at work.     Rehab Potential  Good    PT Frequency  2x / week    PT Duration  4 weeks    PT Treatment/Interventions  Dry needling;Manual techniques;Moist Heat;Ultrasound;Therapeutic activities;Patient/family education;Taping;Therapeutic exercise;Cryotherapy;Electrical Stimulation    PT Next  Visit Plan  assess response to DN/manual work; continue manual work low back and buttocks, assess pelvic alignment; progress core stability exercises.     Consulted and Agree with Plan of Care  Patient       Patient will benefit from skilled therapeutic intervention in order to improve the following deficits and impairments:  Improper body mechanics, Pain, Increased muscle spasms, Decreased range of motion, Decreased strength, Hypomobility, Obesity  Visit Diagnosis: Pain, lumbar region  Muscle spasm of back  Muscle weakness (generalized)     Problem List Patient Active Problem List   Diagnosis Date Noted  . Vitamin D deficiency 07/04/2017  . B12 deficiency 07/04/2017  . Abnormal CT scan 06/19/2017  . Gastroesophageal reflux disease 06/19/2017  . Antiplatelet or antithrombotic long-term use 06/19/2017  . Sinus pressure 03/15/2017  . Nasal congestion 03/15/2017  . Graves disease 02/01/2017  . Sleep difficulties 02/01/2017  . No energy 02/01/2017  . Osteopenia 02/01/2017  . Hypertension, essential 05/08/2016  . IDDM (insulin dependent diabetes mellitus) ([place]) 05/08/2016  . Coronary artery disease involving native coronary artery of native heart with angina pectoris ([place])   . HYPERTHYROIDISM 11/09/2010  . SINUS TACHYCARDIA 11/08/2010  . SHORTNESS OF BREATH 11/08/2010  . DERMATITIS, ALLERGIC 07/20/2010  . EDEMA 07/12/2010  . DIZZINESS 11/14/2009  . ATRIAL FIBRILLATION 07/20/2009  . CAD, ARTERY BYPASS GRAFT 06/07/2009  . ANGINA, STABLE/EXERTIONAL 06/02/2009  . HYPERLIPIDEMIA-MIXED 04/26/2009  . ACUT MI ANTEROLAT WALL SUBSQT EPIS CARE 04/26/2009  . CHEST PAIN 04/26/2009  . DIABETIC  RETINOPATHY 04/25/2009  . CARPAL TUNNEL SYNDROME, BILATERAL 04/25/2009  . TRIGGER FINGER 04/25/2009  . MIGRAINE W/O AURA W/INTRACT W/STATUS MIGRAINOSUS 02/19/2008  . ALLERGIC RHINITIS, SEASONAL 10/16/2006  . CHOLELITHIASIS 10/16/2006  . Rheumatoid arthritis ([place]) 10/16/2006    Celyn Nilda Simmer  PT, MPH  01/20/2018, 11:08 AM  [facility] [place], [place], [address] Phone: [phone]   Fax:  [phone]  Name: [name] MRN: [id number] Date of Birth: [birth year]

## 2018-01-23 ENCOUNTER — Ambulatory Visit (INDEPENDENT_AMBULATORY_CARE_PROVIDER_SITE_OTHER): Payer: PRIVATE HEALTH INSURANCE | Admitting: Rehabilitative and Restorative Service Providers"

## 2018-01-23 ENCOUNTER — Encounter: Payer: Self-pay | Admitting: Rehabilitative and Restorative Service Providers"

## 2018-01-23 DIAGNOSIS — M6281 Muscle weakness (generalized): Secondary | ICD-10-CM

## 2018-01-23 DIAGNOSIS — M62830 Muscle spasm of back: Secondary | ICD-10-CM | POA: Diagnosis not present

## 2018-01-23 DIAGNOSIS — M545 Low back pain, unspecified: Secondary | ICD-10-CM

## 2018-01-23 NOTE — Patient Instructions (Signed)
Back Wall Slide    With feet __10-12__ inches from wall, lean as much of back against the wall as possible. Gently squat down ___ inches, keeping back against wall. Hold __5__ seconds while counting out loud. Repeat __10__ times. Do __2__ sessions per day.   SIT TO STAND: No Device    Sit with feet shoulder-width apart, on floor. Lean chest forward, raise hips up from surface. Straighten hips and knees. Weight bear equally on left and right sides. _10__ reps per set, _1-2__ sets per day

## 2018-01-23 NOTE — Therapy (Signed)
[place], [place], [address] Phone: [phone]   Fax:  [phone]  Physical Therapy Treatment  Patient Details  Name: [name] MRN: [id number] Date of Birth: 01/01/1966 Referring Provider: Fulton Mole, PA   Encounter Date: 01/23/2018  PT End of Session - 01/23/18 1615    Visit Number  6    Number of Visits  8    Date for PT Re-Evaluation  02/04/18    PT Start Time  1324    PT Stop Time  1711    PT Time Calculation (min)  58 min       Past Medical History:  Diagnosis Date  . ACUT MI ANTEROLAT WALL SUBSQT EPIS CARE   . Acute maxillary sinusitis   . ALLERGIC RHINITIS, SEASONAL   . ARTHRITIS, RHEUMATOID   . Atrial fibrillation ([place])    a. after CABG.  . CAD, ARTERY BYPASS GRAFT    a. DES to RCA in 2010 then LAD occlusion s/p CABG 3 06/07/2009 with LIMA to LAD, reverse SVG to D1, reverse SVG to distal RCA. b. Cath 05/08/2016 slightly hypodense region in the intermediate branch, however she had excellent flow, FFR was normal. Vein graft to PDA and the posterolateral branch is patent, patent LIMA to LAD, occluded SVG to diagonal.  . CARPAL TUNNEL SYNDROME, BILATERAL   . CHOLELITHIASIS   . Contrast media allergy   . DERMATITIS, ALLERGIC   . DIABETES MELLITUS, TYPE I    on insulin pump  . DIABETIC  RETINOPATHY   . Hiatal hernia   . HYPERLIPIDEMIA-MIXED   . HYPERTHYROIDISM   . Insulin pump in place   . MIGRAINE W/O AURA W/INTRACT W/STATUS MIGRAINOSUS 02/19/2008  . SINUS TACHYCARDIA 11/08/2010  . TRIGGER FINGER   . URI     Past Surgical History:  Procedure Laterality Date  . ABDOMINAL HYSTERECTOMY    . Caesarean section    . CARDIAC CATHETERIZATION N/A 05/08/2016   Procedure: Left Heart Cath and Cors/Grafts Angiography;  Surgeon: Burnell Blanks, MD;  Location: [facility];  Service: Cardiovascular;  Laterality: N/A;  . CARPAL TUNNEL RELEASE    . CHOLECYSTECTOMY    .  CORONARY ARTERY BYPASS GRAFT    . VITRECTOMY      There were no vitals filed for this visit.  Subjective Assessment - 01/23/18 1615    Subjective  [name] reports that she is feeling some better today. She has had two better days at work with less physical tasks required. Patient reports that the DN does seem to help.     Currently in Pain?  Yes    Pain Score  4     Pain Location  Back    Pain Orientation  Mid    Pain Descriptors / Indicators  Aching    Pain Type  Acute pain    Pain Radiating Towards  radiates into the Rt hip at times - less frequently     Pain Onset  More than a month ago    Pain Frequency  Intermittent                      [facility] Adult PT Treatment/Exercise - 01/23/18 0001      Lumbar Exercises: Stretches   Passive Hamstring Stretch  Right;Left;3 reps;20 seconds    Double Knee to Chest Stretch  3 reps;20 seconds    Piriformis Stretch  Right;Left;1 rep;30 seconds  Lumbar Exercises: Aerobic   Stationary Bike  Nustep L5 x 5 min (arms(10)/legs)      Lumbar Exercises: Standing   Wall Slides  10 reps;5 seconds      Lumbar Exercises: Quadruped   Madcat/Old Horse  5 reps    Other Quadruped Lumbar Exercises  sit back into childs pose 20 sec x 4 reps       Moist Heat Therapy   Number Minutes Moist Heat  20 Minutes    Moist Heat Location  Lumbar Spine      Electrical Stimulation   Electrical Stimulation Location  Rt lumbar and posterior hip/piriformis    Electrical Stimulation Action  IFC    Electrical Stimulation Parameters  to tolerance    Electrical Stimulation Goals  Tone;Pain      Manual Therapy   Manual therapy comments  pt prone     Soft tissue mobilization  STM to Rt> Lt glutes/hip rotators, Rt thoracic / lumbar paraspinals.        Trigger Point Dry Needling - 01/23/18 1628    Consent Given?  Yes    Muscles Treated Lower Body  -- Rt lumbar paraspinals; QL with estim    Gluteus Maximus Response  Palpable increased muscle length     Gluteus Minimus Response  Palpable increased muscle length    Piriformis Response  Palpable increased muscle length           PT Education - 01/23/18 1632    Education provided  Yes    Education Details  HEP     Person(s) Educated  Patient    Methods  Explanation;Demonstration;Tactile cues;Verbal cues;Handout    Comprehension  Verbalized understanding;Returned demonstration;Verbal cues required;Tactile cues required          PT Long Term Goals - 01/20/18 1019      PT LONG TERM GOAL #1   Title  I with advanced HEP ( 02/04/18)     Time  4    Period  Weeks    Status  On-going      PT LONG TERM GOAL #2   Title  improve FOTO =/< 41% limited ( 02/03/18)     Time  4    Period  Weeks    Status  On-going      PT LONG TERM GOAL #3   Title  improve Rt LE strength =/> 5-/5 to allow full return to work ( 02/04/18)     Time  4    Period  Weeks    Status  On-going      PT LONG TERM GOAL #4   Title  perform daily activities at work with no more than 2/10 back pain ( 02/04/18)     Time  4    Period  Weeks    Status  On-going      PT LONG TERM GOAL #5   Title  demo full lumbar ROM without pain to allow her to position herself for patient care at work ( 02/04/18)     Time  4    Period  Weeks    Status  On-going            Plan - 01/23/18 1632    Clinical Impression Statement  Patient is gradually progressing with some decrease in pain and less palpable tightness. She responds well to DN and manual work with good increase in tissue extensibility. She has not talked with supervisor about lighter duty at work but has tried to avoid more  strenous tasks at work that irritate he back pain. This has resulted in better days with less pain this week.     Rehab Potential  Good    PT Frequency  2x / week    PT Duration  4 weeks    PT Treatment/Interventions  Dry needling;Manual techniques;Moist Heat;Ultrasound;Therapeutic activities;Patient/family education;Taping;Therapeutic  exercise;Cryotherapy;Electrical Stimulation    PT Next Visit Plan  assess response to DN/manual work; continue manual work low back and buttocks, assess pelvic alignment; progress core stability exercises.     Consulted and Agree with Plan of Care  Patient       Patient will benefit from skilled therapeutic intervention in order to improve the following deficits and impairments:  Improper body mechanics, Pain, Increased muscle spasms, Decreased range of motion, Decreased strength, Hypomobility, Obesity  Visit Diagnosis: Pain, lumbar region  Muscle spasm of back  Muscle weakness (generalized)     Problem List Patient Active Problem List   Diagnosis Date Noted  . Vitamin D deficiency 07/04/2017  . B12 deficiency 07/04/2017  . Abnormal CT scan 06/19/2017  . Gastroesophageal reflux disease 06/19/2017  . Antiplatelet or antithrombotic long-term use 06/19/2017  . Sinus pressure 03/15/2017  . Nasal congestion 03/15/2017  . Graves disease 02/01/2017  . Sleep difficulties 02/01/2017  . No energy 02/01/2017  . Osteopenia 02/01/2017  . Hypertension, essential 05/08/2016  . IDDM (insulin dependent diabetes mellitus) ([place]) 05/08/2016  . Coronary artery disease involving native coronary artery of native heart with angina pectoris ([place])   . HYPERTHYROIDISM 11/09/2010  . SINUS TACHYCARDIA 11/08/2010  . SHORTNESS OF BREATH 11/08/2010  . DERMATITIS, ALLERGIC 07/20/2010  . EDEMA 07/12/2010  . DIZZINESS 11/14/2009  . ATRIAL FIBRILLATION 07/20/2009  . CAD, ARTERY BYPASS GRAFT 06/07/2009  . ANGINA, STABLE/EXERTIONAL 06/02/2009  . HYPERLIPIDEMIA-MIXED 04/26/2009  . ACUT MI ANTEROLAT WALL SUBSQT EPIS CARE 04/26/2009  . CHEST PAIN 04/26/2009  . DIABETIC  RETINOPATHY 04/25/2009  . CARPAL TUNNEL SYNDROME, BILATERAL 04/25/2009  . TRIGGER FINGER 04/25/2009  . MIGRAINE W/O AURA W/INTRACT W/STATUS MIGRAINOSUS 02/19/2008  . ALLERGIC RHINITIS, SEASONAL 10/16/2006  . CHOLELITHIASIS 10/16/2006  .  Rheumatoid arthritis ([place]) 10/16/2006    [name] Nilda Simmer PT, MPH  01/23/2018, 4:45 PM  [facility] [place], [place], [address] Phone: [phone]   Fax:  [phone]  Name: [name] MRN: [id number] Date of Birth: 1966/02/09

## 2018-01-24 NOTE — Telephone Encounter (Signed)
Received a fax from [facility] regarding a prior authorization approval for [place] from 01/23/2018 to 01/22/2019.   Reference number:[id number] Phone number:[phone]  Will send document to scan center and update pharmacy.  Dajiah Kooi, [place], CPhT 8:07 AM

## 2018-01-27 ENCOUNTER — Ambulatory Visit: Payer: 59 | Admitting: Gastroenterology

## 2018-01-27 ENCOUNTER — Encounter: Payer: Self-pay | Admitting: Physical Therapy

## 2018-01-30 ENCOUNTER — Ambulatory Visit (INDEPENDENT_AMBULATORY_CARE_PROVIDER_SITE_OTHER): Payer: PRIVATE HEALTH INSURANCE | Admitting: Physical Therapy

## 2018-01-30 ENCOUNTER — Encounter: Payer: Self-pay | Admitting: Physical Therapy

## 2018-01-30 DIAGNOSIS — M545 Low back pain, unspecified: Secondary | ICD-10-CM

## 2018-01-30 DIAGNOSIS — M6281 Muscle weakness (generalized): Secondary | ICD-10-CM

## 2018-01-30 DIAGNOSIS — M62830 Muscle spasm of back: Secondary | ICD-10-CM | POA: Diagnosis not present

## 2018-01-30 NOTE — Patient Instructions (Addendum)
Thoracic Self-Mobilization (Supine)    With rolled towel placed lengthwise at lower ribs level, lie back on towel with arms outstretched. Hold _3-5___mins. Relax. Repeat __1__ times per set. Do __1__ sets per session. Do _1___ sessions per day.  Thoracic Self-Mobilization Stretch (Supine)    With small rolled towel at lower ribs level, bra strap level, gently lie back until stretch is felt. Hold __3-5__ mins. Relax. Repeat _1___ times per set. Do __1__ sets per session. Do ___1_ sessions per day.

## 2018-01-30 NOTE — Therapy (Signed)
[facility] [place], [place], [address] Phone: [phone]   Fax:  [phone]  Physical Therapy Treatment  Patient Details  Name: [name] MRN: [id number] Date of Birth: [birth year] Referring Provider: Fulton Mole, PA   Encounter Date: 01/30/2018  PT End of Session - 01/30/18 1738    Visit Number  7    Number of Visits  8    Date for PT Re-Evaluation  02/04/18    PT Start Time  9371    PT Stop Time  1756    PT Time Calculation (min)  64 min    Activity Tolerance  Patient tolerated treatment well       Past Medical History:  Diagnosis Date  . ACUT MI ANTEROLAT WALL SUBSQT EPIS CARE   . Acute maxillary sinusitis   . ALLERGIC RHINITIS, SEASONAL   . ARTHRITIS, RHEUMATOID   . Atrial fibrillation ([place])    a. after CABG.  . CAD, ARTERY BYPASS GRAFT    a. DES to RCA in 2010 then LAD occlusion s/p CABG 3 06/07/2009 with LIMA to LAD, reverse SVG to D1, reverse SVG to distal RCA. b. Cath 05/08/2016 slightly hypodense region in the intermediate branch, however she had excellent flow, FFR was normal. Vein graft to PDA and the posterolateral branch is patent, patent LIMA to LAD, occluded SVG to diagonal.  . CARPAL TUNNEL SYNDROME, BILATERAL   . CHOLELITHIASIS   . Contrast media allergy   . DERMATITIS, ALLERGIC   . DIABETES MELLITUS, TYPE I    on insulin pump  . DIABETIC  RETINOPATHY   . Hiatal hernia   . HYPERLIPIDEMIA-MIXED   . HYPERTHYROIDISM   . Insulin pump in place   . MIGRAINE W/O AURA W/INTRACT W/STATUS MIGRAINOSUS 02/19/2008  . SINUS TACHYCARDIA 11/08/2010  . TRIGGER FINGER   . URI     Past Surgical History:  Procedure Laterality Date  . ABDOMINAL HYSTERECTOMY    . Caesarean section    . CARDIAC CATHETERIZATION N/A 05/08/2016   Procedure: Left Heart Cath and Cors/Grafts Angiography;  Surgeon: Burnell Blanks, MD;  Location: [facility];  Service: Cardiovascular;  Laterality: N/A;   . CARPAL TUNNEL RELEASE    . CHOLECYSTECTOMY    . CORONARY ARTERY BYPASS GRAFT    . VITRECTOMY      There were no vitals filed for this visit.  Subjective Assessment - 01/30/18 1702    Subjective  [name] reports that she had a heavy caseload at work and is really sore today.     Patient Stated Goals  get back to full duty work and not reinjure    Currently in Pain?  Yes    Pain Score  7     Pain Location  Back    Pain Orientation  Mid    Pain Descriptors / Indicators  Aching;Sharp    Pain Type  Acute pain    Pain Onset  More than a month ago    Pain Frequency  Intermittent                      [facility] Adult PT Treatment/Exercise - 01/30/18 0001      Lumbar Exercises: Aerobic   Stationary Bike  Nustep L5 x 5 min (arms(10)/legs)      Lumbar Exercises: Standing   Wall Slides  10 reps;5 seconds reaching out holding 5# wt      Modalities   Modalities  Electrical  Stimulation;Moist Heat;Ultrasound      Moist Heat Therapy   Number Minutes Moist Heat  15 Minutes    Moist Heat Location  Lumbar Spine and thoracic       Electrical Stimulation   Electrical Stimulation Location  bilat mid thoracic to lumbar paraspinals    Electrical Stimulation Action  IFC     Electrical Stimulation Parameters  to tolerance     Electrical Stimulation Goals  Tone;Pain      Ultrasound   Ultrasound Location  Rt lower thoracic paraspinals    Ultrasound Parameters  combo 100%, 1.0 mHz, 1.5w/cm2    Ultrasound Goals  Pain one      Manual Therapy   Manual Therapy  Soft tissue mobilization;Joint mobilization    Manual therapy comments  pt prone     Joint Mobilization  grade III spinal mobs CPA sacrum to mid thoracic, pt with increased mobility in her spine as compared to eval, some tightness in the mid thoracic spine.     Soft tissue mobilization  STM Rt lumbar and thoracic paraspinals             PT Education - 01/30/18 1740    Education provided  Yes    Education Details  HEP     Person(s) Educated  Patient    Methods  Explanation;Demonstration;Handout    Comprehension  Verbalized understanding          PT Long Term Goals - 01/20/18 1019      PT LONG TERM GOAL #1   Title  I with advanced HEP ( 02/04/18)     Time  4    Period  Weeks    Status  On-going      PT LONG TERM GOAL #2   Title  improve FOTO =/< 41% limited ( 02/03/18)     Time  4    Period  Weeks    Status  On-going      PT LONG TERM GOAL #3   Title  improve Rt LE strength =/> 5-/5 to allow full return to work ( 02/04/18)     Time  4    Period  Weeks    Status  On-going      PT LONG TERM GOAL #4   Title  perform daily activities at work with no more than 2/10 back pain ( 02/04/18)     Time  4    Period  Weeks    Status  On-going      PT LONG TERM GOAL #5   Title  demo full lumbar ROM without pain to allow her to position herself for patient care at work ( 02/04/18)     Time  4    Period  Weeks    Status  On-going            Plan - 01/30/18 1740    Clinical Impression Statement  [name] will begin to feel better and then she will have a hard day at work when she has to be in unusual positions that restrain her back. This has been a harder week and she has increased pain and muscle spasms in her Rt paraspinals.  She has been encouraged to reach out to her supervisor and case manager to discuss keeping her work with in her restrictions.  She is going to have a difficulty time healing and returning to full duty if she keeps reinjuring her back at work.     Rehab Potential  Good  PT Frequency  1x / week    PT Duration  4 weeks    PT Treatment/Interventions  Dry needling;Manual techniques;Moist Heat;Ultrasound;Therapeutic activities;Patient/family education;Taping;Therapeutic exercise;Cryotherapy;Electrical Stimulation    PT Next Visit Plan  continue with manual work PRN, spinal mob with exercise and manaul, progress core stabilization, FOTO and request more visits    Consulted and Agree with  Plan of Care  Patient       Patient will benefit from skilled therapeutic intervention in order to improve the following deficits and impairments:  Improper body mechanics, Pain, Increased muscle spasms, Decreased range of motion, Decreased strength, Hypomobility, Obesity  Visit Diagnosis: Pain, lumbar region  Muscle spasm of back  Muscle weakness (generalized)     Problem List Patient Active Problem List   Diagnosis Date Noted  . Vitamin D deficiency 07/04/2017  . B12 deficiency 07/04/2017  . Abnormal CT scan 06/19/2017  . Gastroesophageal reflux disease 06/19/2017  . Antiplatelet or antithrombotic long-term use 06/19/2017  . Sinus pressure 03/15/2017  . Nasal congestion 03/15/2017  . Graves disease 02/01/2017  . Sleep difficulties 02/01/2017  . No energy 02/01/2017  . Osteopenia 02/01/2017  . Hypertension, essential 05/08/2016  . IDDM (insulin dependent diabetes mellitus) ([place]) 05/08/2016  . Coronary artery disease involving native coronary artery of native heart with angina pectoris ([place])   . HYPERTHYROIDISM 11/09/2010  . SINUS TACHYCARDIA 11/08/2010  . SHORTNESS OF BREATH 11/08/2010  . DERMATITIS, ALLERGIC 07/20/2010  . EDEMA 07/12/2010  . DIZZINESS 11/14/2009  . ATRIAL FIBRILLATION 07/20/2009  . CAD, ARTERY BYPASS GRAFT 06/07/2009  . ANGINA, STABLE/EXERTIONAL 06/02/2009  . HYPERLIPIDEMIA-MIXED 04/26/2009  . ACUT MI ANTEROLAT WALL SUBSQT EPIS CARE 04/26/2009  . CHEST PAIN 04/26/2009  . DIABETIC  RETINOPATHY 04/25/2009  . CARPAL TUNNEL SYNDROME, BILATERAL 04/25/2009  . TRIGGER FINGER 04/25/2009  . MIGRAINE W/O AURA W/INTRACT W/STATUS MIGRAINOSUS 02/19/2008  . ALLERGIC RHINITIS, SEASONAL 10/16/2006  . CHOLELITHIASIS 10/16/2006  . Rheumatoid arthritis ([place]) 10/16/2006    Manuela Schwartz Mayo Owczarzak PT  01/30/2018, 5:47 PM  [facility] [place], [place], [address] Phone: [phone]   Fax:   [phone]  Name: JOSCELINE CHENARD MRN: [id number] Date of Birth: 1966/07/02

## 2018-02-03 ENCOUNTER — Encounter: Payer: Self-pay | Admitting: Rehabilitative and Restorative Service Providers"

## 2018-02-03 ENCOUNTER — Ambulatory Visit (INDEPENDENT_AMBULATORY_CARE_PROVIDER_SITE_OTHER): Payer: PRIVATE HEALTH INSURANCE | Admitting: Rehabilitative and Restorative Service Providers"

## 2018-02-03 DIAGNOSIS — M545 Low back pain, unspecified: Secondary | ICD-10-CM

## 2018-02-03 DIAGNOSIS — M6281 Muscle weakness (generalized): Secondary | ICD-10-CM | POA: Diagnosis not present

## 2018-02-03 DIAGNOSIS — M62830 Muscle spasm of back: Secondary | ICD-10-CM

## 2018-02-03 NOTE — Therapy (Signed)
[place], [place], [address] Phone: [phone]   Fax:  [phone]  Physical Therapy Treatment  Patient Details  Name: [name] MRN: [id number] Date of Birth: Feb 18, 1966 Referring Provider: Fulton Mole, PA-C   Encounter Date: 02/03/2018  PT End of Session - 02/03/18 0725    Visit Number  8    Number of Visits  12    Date for PT Re-Evaluation  03/17/18    PT Start Time  0722    PT Stop Time  0820    PT Time Calculation (min)  58 min    Activity Tolerance  Patient tolerated treatment well       Past Medical History:  Diagnosis Date  . ACUT MI ANTEROLAT WALL SUBSQT EPIS CARE   . Acute maxillary sinusitis   . ALLERGIC RHINITIS, SEASONAL   . ARTHRITIS, RHEUMATOID   . Atrial fibrillation ([place])    a. after CABG.  . CAD, ARTERY BYPASS GRAFT    a. DES to RCA in 2010 then LAD occlusion s/p CABG 3 06/07/2009 with LIMA to LAD, reverse SVG to D1, reverse SVG to distal RCA. b. Cath 05/08/2016 slightly hypodense region in the intermediate branch, however she had excellent flow, FFR was normal. Vein graft to PDA and the posterolateral branch is patent, patent LIMA to LAD, occluded SVG to diagonal.  . CARPAL TUNNEL SYNDROME, BILATERAL   . CHOLELITHIASIS   . Contrast media allergy   . DERMATITIS, ALLERGIC   . DIABETES MELLITUS, TYPE I    on insulin pump  . DIABETIC  RETINOPATHY   . Hiatal hernia   . HYPERLIPIDEMIA-MIXED   . HYPERTHYROIDISM   . Insulin pump in place   . MIGRAINE W/O AURA W/INTRACT W/STATUS MIGRAINOSUS 02/19/2008  . SINUS TACHYCARDIA 11/08/2010  . TRIGGER FINGER   . URI     Past Surgical History:  Procedure Laterality Date  . ABDOMINAL HYSTERECTOMY    . Caesarean section    . CARDIAC CATHETERIZATION N/A 05/08/2016   Procedure: Left Heart Cath and Cors/Grafts Angiography;  Surgeon: Burnell Blanks, MD;  Location: [facility];  Service: Cardiovascular;  Laterality: N/A;   . CARPAL TUNNEL RELEASE    . CHOLECYSTECTOMY    . CORONARY ARTERY BYPASS GRAFT    . VITRECTOMY      There were no vitals filed for this visit.  Subjective Assessment - 02/03/18 0725    Subjective  Diania reports that she rested all weekend. Pain was bad Thursday and Friday. The rest did help the LBP.     Currently in Pain?  Yes    Pain Score  4     Pain Location  Back    Pain Orientation  Lower;Mid;Right;Left Rt > Lt     Pain Descriptors / Indicators  Aching;Stabbing    Pain Type  Acute pain    Pain Radiating Towards  radiates into the Rt > Lt hip at times - only with increased movement.          [facility] PT Assessment - 02/03/18 0001      Assessment   Medical Diagnosis  Lumbar strain    Referring Provider  Fulton Mole, PA-C    Onset Date/Surgical Date  10/14/17    Hand Dominance  Right    Next MD Visit  PRN after therapy      Observation/Other Assessments   Focus on Therapeutic Outcomes (FOTO)   52% limitation  Posture/Postural Control   Posture Comments  head forward; shoulders rounded      AROM   Lumbar Flexion  mid shin with pulling in low back     Lumbar Extension  15% pain Rt LB    Lumbar - Right Side Bend  75% pulling    Lumbar - Left Side Bend  75% pulling in LB    Lumbar - Right Rotation  50% discomfort LB     Lumbar - Left Rotation  50% discomfort       Strength   Right Hip Flexion  -- 5-/5    Right Hip Extension  4/5    Right Hip ABduction  4/5    Left Hip Flexion  5/5    Left Hip Extension  5/5    Left Hip ABduction  5/5    Right/Left Ankle  -- WNL's       Flexibility   Hamstrings  slight tightness noted Rt compared to Lt     Quadriceps  WNL's     Piriformis  tight Rt > lt       Palpation   Spinal mobility  hypomobile in lumbar and lower thoracic spine with tenderness greatest with CPA mobs at L3-L5    Palpation comment  tightness in bilat gluts/piriformis, tender on the Rt side and lumbar paraspinals                   [facility]  Adult PT Treatment/Exercise - 02/03/18 0001      Lumbar Exercises: Stretches   Passive Hamstring Stretch  Right;Left;3 reps;20 seconds    Double Knee to Chest Stretch  3 reps;20 seconds      Lumbar Exercises: Aerobic   Stationary Bike  Nustep L5 x 5 min (arms(10)/legs)      Lumbar Exercises: Prone   Other Prone Lumbar Exercises  pelvic press 5 sec x 10     Other Prone Lumbar Exercises  SLR prone hip ext increased pain in the Rt hip/LB       Moist Heat Therapy   Number Minutes Moist Heat  20 Minutes    Moist Heat Location  Lumbar Spine Rt hip       Electrical Stimulation   Electrical Stimulation Location  bilat lumbar; Rt posterior hip     Electrical Stimulation Action  IFC    Electrical Stimulation Parameters  to tolerance    Electrical Stimulation Goals  Tone;Pain      Ultrasound   Ultrasound Location  Rt lumbar-posterior hip     Ultrasound Parameters  1.5 w/cm2; 1 mHz; 100%; 7 min     Ultrasound Goals  Pain one      Manual Therapy   Manual Therapy  Soft tissue mobilization;Joint mobilization    Manual therapy comments  pt prone     Joint Mobilization  grade III spinal mobs CPA sacrum to mid thoracic, pt with increased mobility in her spine as compared to eval, some tightness in the mid thoracic spine.     Soft tissue mobilization  STM Rt lumbar and thoracic paraspinals       Trigger Point Dry Needling - 02/03/18 6834    Consent Given?  Yes    Muscles Treated Lower Body  -- Rt lumbar paraspinals     Gluteus Maximus Response  Palpable increased muscle length    Gluteus Minimus Response  Palpable increased muscle length    Piriformis Response  Palpable increased muscle length  PT Education - 02/03/18 0806    Education Details  HEP           PT Long Term Goals - 02/03/18 0828      PT LONG TERM GOAL #1   Title  I with advanced HEP (03/17/18)     Time  10    Period  Weeks    Status  Revised      PT LONG TERM GOAL #2   Title  improve FOTO =/< 41%  limited ( 03/17/18)     Time  10    Period  Weeks    Status  Revised      PT LONG TERM GOAL #3   Title  improve Rt LE strength =/> 5-/5 to allow full return to work ( 03/17/18)     Time  10    Period  Weeks    Status  Revised      PT LONG TERM GOAL #4   Title  perform daily activities at work with no more than 2/10 back pain ( 03/17/18)     Time  10    Period  Weeks    Status  Revised      PT LONG TERM GOAL #5   Title  demo full lumbar ROM without pain to allow her to position herself for patient care at work ( 03/17/18)     Time  [place]    Status  Revised            Plan - 02/03/18 0823    Clinical Impression Statement  Dustyn continues to demonstrated limited trunk and LE moblity and ROM; decreased strength Rt LE; pain with palpation through lower thoracic and lumbar spine with mobs; muscular tightness to palpation through lumbar and Rt > Lt muscular tightness. She reports increased symptoms associated with work activities. She is on light duty but continues to irritate symptoms. Patient has been encouraged to contact supervisor for further clarification of work duties. She will contact provided for re-assessment which has not been scheduled.     Rehab Potential  Good    PT Frequency  2x / week    PT Duration  12 weeks    PT Treatment/Interventions  Dry needling;Manual techniques;Moist Heat;Ultrasound;Therapeutic activities;Patient/family education;Taping;Therapeutic exercise;Cryotherapy;Electrical Stimulation    PT Next Visit Plan  continue with manual work PRN, spinal mob with exercise and manaul, progress core stabilization; re-certification     Consulted and Agree with Plan of Care  Patient       Patient will benefit from skilled therapeutic intervention in order to improve the following deficits and impairments:  Improper body mechanics, Pain, Increased muscle spasms, Decreased range of motion, Decreased strength, Hypomobility, Obesity  Visit  Diagnosis: Pain, lumbar region - Plan: PT plan of care cert/re-cert  Muscle spasm of back - Plan: PT plan of care cert/re-cert  Muscle weakness (generalized) - Plan: PT plan of care cert/re-cert     Problem List Patient Active Problem List   Diagnosis Date Noted  . Vitamin D deficiency 07/04/2017  . B12 deficiency 07/04/2017  . Abnormal CT scan 06/19/2017  . Gastroesophageal reflux disease 06/19/2017  . Antiplatelet or antithrombotic long-term use 06/19/2017  . Sinus pressure 03/15/2017  . Nasal congestion 03/15/2017  . Graves disease 02/01/2017  . Sleep difficulties 02/01/2017  . No energy 02/01/2017  . Osteopenia 02/01/2017  . Hypertension, essential 05/08/2016  . IDDM (insulin dependent diabetes mellitus) ([place]) 05/08/2016  . Coronary artery disease  involving native coronary artery of native heart with angina pectoris ([place])   . HYPERTHYROIDISM 11/09/2010  . SINUS TACHYCARDIA 11/08/2010  . SHORTNESS OF BREATH 11/08/2010  . DERMATITIS, ALLERGIC 07/20/2010  . EDEMA 07/12/2010  . DIZZINESS 11/14/2009  . ATRIAL FIBRILLATION 07/20/2009  . CAD, ARTERY BYPASS GRAFT 06/07/2009  . ANGINA, STABLE/EXERTIONAL 06/02/2009  . HYPERLIPIDEMIA-MIXED 04/26/2009  . ACUT MI ANTEROLAT WALL SUBSQT EPIS CARE 04/26/2009  . CHEST PAIN 04/26/2009  . DIABETIC  RETINOPATHY 04/25/2009  . CARPAL TUNNEL SYNDROME, BILATERAL 04/25/2009  . TRIGGER FINGER 04/25/2009  . MIGRAINE W/O AURA W/INTRACT W/STATUS MIGRAINOSUS 02/19/2008  . ALLERGIC RHINITIS, SEASONAL 10/16/2006  . CHOLELITHIASIS 10/16/2006  . Rheumatoid arthritis ([place]) 10/16/2006    Payal Stanforth Nilda Simmer PT,  MPH  02/03/2018, 8:33 AM  [facility] [place], [place], [address] Phone: [phone]   Fax:  [phone]  Name: KUMARI SCULLEY MRN: [id number] Date of Birth: 01/07/1966

## 2018-02-03 NOTE — Patient Instructions (Signed)
Pelvic Press    Place hands under belly between navel and pubic bone, palms up. Feel pressure on hands. Increase pressure on hands by pressing pelvis down. This is NOT a pelvic tilt. Hold _5__ seconds. Relax. Repeat _10__ times.

## 2018-02-06 ENCOUNTER — Encounter: Payer: Self-pay | Admitting: Physical Therapy

## 2018-02-06 ENCOUNTER — Ambulatory Visit (INDEPENDENT_AMBULATORY_CARE_PROVIDER_SITE_OTHER): Payer: PRIVATE HEALTH INSURANCE | Admitting: Physical Therapy

## 2018-02-06 DIAGNOSIS — M62830 Muscle spasm of back: Secondary | ICD-10-CM

## 2018-02-06 DIAGNOSIS — M6281 Muscle weakness (generalized): Secondary | ICD-10-CM | POA: Diagnosis not present

## 2018-02-06 DIAGNOSIS — M545 Low back pain, unspecified: Secondary | ICD-10-CM

## 2018-02-06 NOTE — Therapy (Signed)
[place], [place], [address] Phone: [phone]   Fax:  [phone]  Physical Therapy Treatment  Patient Details  Name: [name] MRN: [id number] Date of Birth: 02-05-1966 Referring Provider: Fulton Mole, PA-C   Encounter Date: 02/06/2018  PT End of Session - 02/06/18 1644    Visit Number  9    Number of Visits  12    Date for PT Re-Evaluation  03/17/18    PT Start Time  1287    PT Stop Time  1752    PT Time Calculation (min)  71 min    Activity Tolerance  Patient tolerated treatment well       Past Medical History:  Diagnosis Date  . ACUT MI ANTEROLAT WALL SUBSQT EPIS CARE   . Acute maxillary sinusitis   . ALLERGIC RHINITIS, SEASONAL   . ARTHRITIS, RHEUMATOID   . Atrial fibrillation ([place])    a. after CABG.  . CAD, ARTERY BYPASS GRAFT    a. DES to RCA in 2010 then LAD occlusion s/p CABG 3 06/07/2009 with LIMA to LAD, reverse SVG to D1, reverse SVG to distal RCA. b. Cath 05/08/2016 slightly hypodense region in the intermediate branch, however she had excellent flow, FFR was normal. Vein graft to PDA and the posterolateral branch is patent, patent LIMA to LAD, occluded SVG to diagonal.  . CARPAL TUNNEL SYNDROME, BILATERAL   . CHOLELITHIASIS   . Contrast media allergy   . DERMATITIS, ALLERGIC   . DIABETES MELLITUS, TYPE I    on insulin pump  . DIABETIC  RETINOPATHY   . Hiatal hernia   . HYPERLIPIDEMIA-MIXED   . HYPERTHYROIDISM   . Insulin pump in place   . MIGRAINE W/O AURA W/INTRACT W/STATUS MIGRAINOSUS 02/19/2008  . SINUS TACHYCARDIA 11/08/2010  . TRIGGER FINGER   . URI     Past Surgical History:  Procedure Laterality Date  . ABDOMINAL HYSTERECTOMY    . Caesarean section    . CARDIAC CATHETERIZATION N/A 05/08/2016   Procedure: Left Heart Cath and Cors/Grafts Angiography;  Surgeon: Burnell Blanks, MD;  Location: [facility];  Service: Cardiovascular;  Laterality: N/A;   . CARPAL TUNNEL RELEASE    . CHOLECYSTECTOMY    . CORONARY ARTERY BYPASS GRAFT    . VITRECTOMY      There were no vitals filed for this visit.  Subjective Assessment - 02/06/18 1647    Subjective  [name] states her back is still really sore. Pt will be out of the OR for the next three weeks and in endoscopy there less pulling today was her first day.     Patient Stated Goals  get back to full duty work and not reinjure    Currently in Pain?  Yes    Pain Score  7     Pain Location  Back    Pain Orientation  Mid    Pain Descriptors / Indicators  Aching;Stabbing    Pain Type  Acute pain                      [facility] Adult PT Treatment/Exercise - 02/06/18 0001      Lumbar Exercises: Aerobic   Stationary Bike  Nustep L5 x 5 min (arms(10)/legs)      Lumbar Exercises: Standing   Other Standing Lumbar Exercises  doorway stretches with strap to open up chest and sides to take pressure of back and allow improved spinal  alignment      Lumbar Exercises: Supine   Other Supine Lumbar Exercises  POE serratus pushes x 10 with focus on improving thoracic mobility.     Other Supine Lumbar Exercises  over black bolster with arms overhead, progresses to lying over thick noodle scooting up back.       Lumbar Exercises: Sidelying   Other Sidelying Lumbar Exercises  15 reps upper body rotations holding  yellow band.       Modalities   Modalities  Electrical Stimulation;Moist Heat      Moist Heat Therapy   Number Minutes Moist Heat  20 Minutes    Moist Heat Location  Lumbar Spine thoracic spine      Electrical Stimulation   Electrical Stimulation Location  bilat upper lumbar and thoracic paraspinals    Electrical Stimulation Action  burst    Electrical Stimulation Parameters  to tolerance    Electrical Stimulation Goals  Tone;Pain      Manual Therapy   Manual Therapy  Joint mobilization    Joint Mobilization  most tender with CPA T10-L1 and T4-7, began with grade II and  progressed to grade III through out thoracic area.                   PT Long Term Goals - 02/03/18 3810      PT LONG TERM GOAL #1   Title  I with advanced HEP (03/17/18)     Time  10    Period  Weeks    Status  Revised      PT LONG TERM GOAL #2   Title  improve FOTO =/< 41% limited ( 03/17/18)     Time  10    Period  Weeks    Status  Revised      PT LONG TERM GOAL #3   Title  improve Rt LE strength =/> 5-/5 to allow full return to work ( 03/17/18)     Time  10    Period  Weeks    Status  Revised      PT LONG TERM GOAL #4   Title  perform daily activities at work with no more than 2/10 back pain ( 03/17/18)     Time  10    Period  Weeks    Status  Revised      PT LONG TERM GOAL #5   Title  demo full lumbar ROM without pain to allow her to position herself for patient care at work ( 03/17/18)     Time  10    Period  Weeks    Status  Revised            Plan - 02/06/18 1727    Clinical Impression Statement  Held DN today and focused on improving thoracic spinal mobility. Her pain is mainly into the thoracic region and she is very tender and hypomobile in this area.  Worked on increasing thoracic extension along with loosening the muscles in the  front of the body to release the back.  [name] reports that her pain was down 2 points after manual work and stretches and even more after stim/heat.     Rehab Potential  Good    PT Frequency  2x / week    PT Duration  12 weeks    PT Treatment/Interventions  Dry needling;Manual techniques;Moist Heat;Ultrasound;Therapeutic activities;Patient/family education;Taping;Therapeutic exercise;Cryotherapy;Building control surveyor and Agree with Plan of Care  Patient  Patient will benefit from skilled therapeutic intervention in order to improve the following deficits and impairments:  Improper body mechanics, Pain, Increased muscle spasms, Decreased range of motion, Decreased strength, Hypomobility,  Obesity  Visit Diagnosis: Pain, lumbar region  Muscle spasm of back  Muscle weakness (generalized)     Problem List Patient Active Problem List   Diagnosis Date Noted  . Vitamin D deficiency 07/04/2017  . B12 deficiency 07/04/2017  . Abnormal CT scan 06/19/2017  . Gastroesophageal reflux disease 06/19/2017  . Antiplatelet or antithrombotic long-term use 06/19/2017  . Sinus pressure 03/15/2017  . Nasal congestion 03/15/2017  . Graves disease 02/01/2017  . Sleep difficulties 02/01/2017  . No energy 02/01/2017  . Osteopenia 02/01/2017  . Hypertension, essential 05/08/2016  . IDDM (insulin dependent diabetes mellitus) ([place]) 05/08/2016  . Coronary artery disease involving native coronary artery of native heart with angina pectoris ([place])   . HYPERTHYROIDISM 11/09/2010  . SINUS TACHYCARDIA 11/08/2010  . SHORTNESS OF BREATH 11/08/2010  . DERMATITIS, ALLERGIC 07/20/2010  . EDEMA 07/12/2010  . DIZZINESS 11/14/2009  . ATRIAL FIBRILLATION 07/20/2009  . CAD, ARTERY BYPASS GRAFT 06/07/2009  . ANGINA, STABLE/EXERTIONAL 06/02/2009  . HYPERLIPIDEMIA-MIXED 04/26/2009  . ACUT MI ANTEROLAT WALL SUBSQT EPIS CARE 04/26/2009  . CHEST PAIN 04/26/2009  . DIABETIC  RETINOPATHY 04/25/2009  . CARPAL TUNNEL SYNDROME, BILATERAL 04/25/2009  . TRIGGER FINGER 04/25/2009  . MIGRAINE W/O AURA W/INTRACT W/STATUS MIGRAINOSUS 02/19/2008  . ALLERGIC RHINITIS, SEASONAL 10/16/2006  . CHOLELITHIASIS 10/16/2006  . Rheumatoid arthritis ([place]) 10/16/2006    Jeral Pinch PT  02/06/2018, 5:36 PM  [facility] [place], [place], [address] Phone: [phone]   Fax:  [phone]  Name: KENNEDE LUSK MRN: [id number] Date of Birth: [birth year]

## 2018-02-07 ENCOUNTER — Telehealth: Payer: Self-pay | Admitting: Pharmacist

## 2018-02-07 ENCOUNTER — Other Ambulatory Visit: Payer: Self-pay | Admitting: *Deleted

## 2018-02-07 MED ORDER — GOLIMUMAB 100 MG/ML ~~LOC~~ SOAJ: SUBCUTANEOUS | 0 refills | Status: DC

## 2018-02-07 NOTE — Telephone Encounter (Signed)
Called patient to schedule an appointment for the [employer]. I was unable to reach the patient so I left a HIPAA-compliant message requesting that the patient return my call.   

## 2018-02-07 NOTE — Telephone Encounter (Signed)
Refill request received via fax  Last Visit: 01/02/18 Next Visit: 06/23/18 Labs: 01/02/18 WNL TB Gold: 08/02/17 Neg   Okay to refill per Dr. Estanislado Pandy

## 2018-02-10 ENCOUNTER — Ambulatory Visit (INDEPENDENT_AMBULATORY_CARE_PROVIDER_SITE_OTHER): Payer: PRIVATE HEALTH INSURANCE | Admitting: Physical Therapy

## 2018-02-10 ENCOUNTER — Ambulatory Visit (HOSPITAL_BASED_OUTPATIENT_CLINIC_OR_DEPARTMENT_OTHER): Payer: 59 | Admitting: Pharmacist

## 2018-02-10 DIAGNOSIS — M6281 Muscle weakness (generalized): Secondary | ICD-10-CM | POA: Diagnosis not present

## 2018-02-10 DIAGNOSIS — Z79899 Other long term (current) drug therapy: Secondary | ICD-10-CM

## 2018-02-10 DIAGNOSIS — M545 Low back pain, unspecified: Secondary | ICD-10-CM

## 2018-02-10 DIAGNOSIS — M62830 Muscle spasm of back: Secondary | ICD-10-CM | POA: Diagnosis not present

## 2018-02-10 NOTE — Therapy (Signed)
[place], [place], [address] Phone: [phone]   Fax:  [phone]  Physical Therapy Treatment  Patient Details  Name: [name] MRN: [id number] Date of Birth: [birth year] Referring Provider: Fulton Mole, PA-C   Encounter Date: 02/10/2018  PT End of Session - 02/10/18 1014    Visit Number  10    Number of Visits  20    Date for PT Re-Evaluation  03/17/18    Authorization - Visit Number  16    PT Start Time  3817    PT Stop Time  1114    PT Time Calculation (min)  68 min    Activity Tolerance  Patient tolerated treatment well    Behavior During Therapy  [facility] for tasks assessed/performed       Past Medical History:  Diagnosis Date  . ACUT MI ANTEROLAT WALL SUBSQT EPIS CARE   . Acute maxillary sinusitis   . ALLERGIC RHINITIS, SEASONAL   . ARTHRITIS, RHEUMATOID   . Atrial fibrillation ([place])    a. after CABG.  . CAD, ARTERY BYPASS GRAFT    a. DES to RCA in 2010 then LAD occlusion s/p CABG 3 06/07/2009 with LIMA to LAD, reverse SVG to D1, reverse SVG to distal RCA. b. Cath 05/08/2016 slightly hypodense region in the intermediate branch, however she had excellent flow, FFR was normal. Vein graft to PDA and the posterolateral branch is patent, patent LIMA to LAD, occluded SVG to diagonal.  . CARPAL TUNNEL SYNDROME, BILATERAL   . CHOLELITHIASIS   . Contrast media allergy   . DERMATITIS, ALLERGIC   . DIABETES MELLITUS, TYPE I    on insulin pump  . DIABETIC  RETINOPATHY   . Hiatal hernia   . HYPERLIPIDEMIA-MIXED   . HYPERTHYROIDISM   . Insulin pump in place   . MIGRAINE W/O AURA W/INTRACT W/STATUS MIGRAINOSUS 02/19/2008  . SINUS TACHYCARDIA 11/08/2010  . TRIGGER FINGER   . URI     Past Surgical History:  Procedure Laterality Date  . ABDOMINAL HYSTERECTOMY    . Caesarean section    . CARDIAC CATHETERIZATION N/A 05/08/2016   Procedure: Left Heart Cath and Cors/Grafts Angiography;  Surgeon:  Burnell Blanks, MD;  Location: [facility];  Service: Cardiovascular;  Laterality: N/A;  . CARPAL TUNNEL RELEASE    . CHOLECYSTECTOMY    . CORONARY ARTERY BYPASS GRAFT    . VITRECTOMY      There were no vitals filed for this visit.  Subjective Assessment - 02/10/18 1012    Subjective  Pt reports the shoulder stretch she did last visit really irritated her shoulders for 2-3 days.  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      Currently in Pain?  Yes    Pain Score  5     Pain Location  Back    Pain Orientation  -- R>Lt    Pain Descriptors / Indicators  Aching    Aggravating Factors   lifting/ pushing/ overhead work    Pain Relieving Factors  heat, TENS.          [facility] PT Assessment - 02/10/18 0001      Assessment   Medical Diagnosis  Lumbar strain    Referring Provider  Fulton Mole, PA-C    Onset Date/Surgical Date  10/14/17    Hand Dominance  Right    Next MD Visit  PRN after therapy  Palpation   SI assessment  Rt PSIS higher, slight Rt sacral torsion, Rt ASIS lower than Lt         [facility] Adult PT Treatment/Exercise - 02/10/18 0001      Lumbar Exercises: Aerobic   Stationary Bike  Nustep L5 x 5 min (arms(10)/legs)      Lumbar Exercises: Supine   Other Supine Lumbar Exercises  Thoracic ext over black bolster x 2 reps, trial of pink pool noodle (not tolerated), then yoga mat x 3 reps      Lumbar Exercises: Sidelying   Other Sidelying Lumbar Exercises  15 reps upper body rotations holding  yellow band.       Lumbar Exercises: Prone   Other Prone Lumbar Exercises  axial ext with scap retraction x 10 reps       Moist Heat Therapy   Number Minutes Moist Heat  20 Minutes    Moist Heat Location  Lumbar Spine thoracic spine      Electrical Stimulation   Electrical Stimulation Location  bilat upper lumbar and thoracic paraspinals    Electrical Stimulation Action  IFC    Electrical Stimulation Parameters  to tolerance     Electrical Stimulation Goals  Pain;Tone      Ultrasound   Ultrasound Location  Rt lower thoracic paraspinals.    Ultrasound Parameters  1.3 w/cm2, 100%, 8 min     Ultrasound Goals  Pain      Manual Therapy   Soft tissue mobilization  STM Rt lumbar and thoracic paraspinals    Muscle Energy Technique  MET for Rt ant rotated ilium with contract relax of hamstring while in supine x 5 reps; MET to correct slight Rt sacral torsion in prone.  Pt placed in hooklying and did bridge after each correction to reset hips.                    PT Long Term Goals - 02/03/18 5409      PT LONG TERM GOAL #1   Title  I with advanced HEP (03/17/18)     Time  10    Period  Weeks    Status  Revised      PT LONG TERM GOAL #2   Title  improve FOTO =/< 41% limited ( 03/17/18)     Time  10    Period  Weeks    Status  Revised      PT LONG TERM GOAL #3   Title  improve Rt LE strength =/> 5-/5 to allow full return to work ( 03/17/18)     Time  10    Period  Weeks    Status  Revised      PT LONG TERM GOAL #4   Title  perform daily activities at work with no more than 2/10 back pain ( 03/17/18)     Time  10    Period  Weeks    Status  Revised      PT LONG TERM GOAL #5   Title  demo full lumbar ROM without pain to allow her to position herself for patient care at work ( 03/17/18)     Time  10    Period  Weeks    Status  Revised            Plan - 02/10/18 1713    Clinical Impression Statement  Pt had difficulty tolerating thoracic rotation with band due to pain in her Rt shoulder; improved tolerance with  lowered shoulder position.  Rt ilium ant rotated; improved alignment with MET correction. She tolerated treatment well, reporting decrease in pain with exercise and MHP/estim.  Pt making gradual progress towards goals.     Rehab Potential  Good    PT Frequency  2x / week    PT Duration  12 weeks    PT Treatment/Interventions  Dry needling;Manual techniques;Moist  Heat;Ultrasound;Therapeutic activities;Patient/family education;Taping;Therapeutic exercise;Cryotherapy;Electrical Stimulation    PT Next Visit Plan  continue with manual work PRN, spinal mob with exercise and manaul, progress core stabilization.    Consulted and Agree with Plan of Care  Patient       Patient will benefit from skilled therapeutic intervention in order to improve the following deficits and impairments:  Improper body mechanics, Pain, Increased muscle spasms, Decreased range of motion, Decreased strength, Hypomobility, Obesity  Visit Diagnosis: Pain, lumbar region  Muscle spasm of back  Muscle weakness (generalized)     Problem List Patient Active Problem List   Diagnosis Date Noted  . Vitamin D deficiency 07/04/2017  . B12 deficiency 07/04/2017  . Abnormal CT scan 06/19/2017  . Gastroesophageal reflux disease 06/19/2017  . Antiplatelet or antithrombotic long-term use 06/19/2017  . Sinus pressure 03/15/2017  . Nasal congestion 03/15/2017  . Graves disease 02/01/2017  . Sleep difficulties 02/01/2017  . No energy 02/01/2017  . Osteopenia 02/01/2017  . Hypertension, essential 05/08/2016  . IDDM (insulin dependent diabetes mellitus) ([place]) 05/08/2016  . Coronary artery disease involving native coronary artery of native heart with angina pectoris ([place])   . HYPERTHYROIDISM 11/09/2010  . SINUS TACHYCARDIA 11/08/2010  . SHORTNESS OF BREATH 11/08/2010  . DERMATITIS, ALLERGIC 07/20/2010  . EDEMA 07/12/2010  . DIZZINESS 11/14/2009  . ATRIAL FIBRILLATION 07/20/2009  . CAD, ARTERY BYPASS GRAFT 06/07/2009  . ANGINA, STABLE/EXERTIONAL 06/02/2009  . HYPERLIPIDEMIA-MIXED 04/26/2009  . ACUT MI ANTEROLAT WALL SUBSQT EPIS CARE 04/26/2009  . CHEST PAIN 04/26/2009  . DIABETIC  RETINOPATHY 04/25/2009  . CARPAL TUNNEL SYNDROME, BILATERAL 04/25/2009  . TRIGGER FINGER 04/25/2009  . MIGRAINE W/O AURA W/INTRACT W/STATUS MIGRAINOSUS 02/19/2008  . ALLERGIC RHINITIS, SEASONAL  10/16/2006  . CHOLELITHIASIS 10/16/2006  . Rheumatoid arthritis ([place]) 10/16/2006   Kerin Perna, PTA 02/10/18 5:17 PM  [place], [place], [address] Phone: [phone]   Fax:  [phone]  Name: MAKALA FETTEROLF MRN: [id number] Date of Birth: Aug 20, 1966

## 2018-02-10 NOTE — Progress Notes (Signed)
   S: Patient presents today to the [facility].  Patient is currently taking Simponi for rheumatoid arthritis. Patient is managed by Dr. Estanislado Pandy for this.   Adherence: denies any missed doses  Efficacy: this is working the best of any drug that she has taken so far for RA  Had an injury at work and is now in PT.  Dosing:  Rheumatoid arthritis: SubQ: 50 mg once a month (in combination with methotrexate)  Drug-drug interactions: Arava (leflunomide) and Humira: Immunosuppressants may enhance the adverse/toxic effect of Leflunomide. Specifically, the risk for hematologic toxicity such as pancytopenia, agranulocytosis, and/or thrombocytopenia may be increased. Last CBC good and is closely monitored.   Screening: TB test: completed per patient Hepatitis: completed per patient  Monitoring: S/sx of infection: denies CBC WNL 12/2016 S/sx of hypersensitivity: denies S/sx of malignancy: denies S/sx of heart failure: denies S/sx of autoimmune disorder: denies    O:     Lab Results  Component Value Date   WBC 4.6 01/02/2018   HGB 13.8 01/02/2018   HCT 38.6 01/02/2018   MCV 85.2 01/02/2018   PLT 178 01/02/2018      Chemistry      Component Value Date/Time   NA 142 01/02/2018 1437   NA 142 07/16/2016   K 4.1 01/02/2018 1437   CL 105 01/02/2018 1437   CO2 30 01/02/2018 1437   BUN 7 01/02/2018 1437   BUN 7 07/16/2016   CREATININE 0.79 01/02/2018 1437   GLU 119 07/16/2016      Component Value Date/Time   CALCIUM 9.3 01/02/2018 1437   ALKPHOS 70 08/02/2017 1134   AST 22 01/02/2018 1437   ALT 27 01/02/2018 1437   BILITOT 0.5 01/02/2018 1437       A/P: 1. Medication review: Patient currently on Simponi for RA and is tolerating it well with no adverse effects. Reviewed the medication with her, including the following: Simponi, golimumab, is a TNF blocker.  There is an increased risk of infection and malignancy with  this medication. Do not give patients live vaccinations while they are on this medication. No recommendations for changes.   Christella Hartigan, PharmD, BCPS, BCACP, [facility] [phone]

## 2018-02-11 ENCOUNTER — Other Ambulatory Visit: Payer: Self-pay | Admitting: Pharmacist

## 2018-02-11 ENCOUNTER — Other Ambulatory Visit: Payer: Self-pay | Admitting: Rheumatology

## 2018-02-11 MED ORDER — GOLIMUMAB 100 MG/ML ~~LOC~~ SOAJ: SUBCUTANEOUS | 1 refills | Status: DC

## 2018-02-11 MED FILL — LEFLUNOMIDE 20 MG TABLET: 20 | 30 days supply | Qty: 30 | Fill #1

## 2018-02-11 MED FILL — CYCLOBENZAPRINE HCL 10 MG T: 10 | 30 days supply | Qty: 30 | Fill #0

## 2018-02-11 MED FILL — NABUMETONE 750 MG TABLET: 750 | 30 days supply | Qty: 60 | Fill #1

## 2018-02-11 MED FILL — CLOPIDOGREL 75 MG TABLET: 75 | 90 days supply | Qty: 90 | Fill #2

## 2018-02-11 MED FILL — SIMPONI 100 MG/ML SOAJ: 100 | 30 days supply | Qty: 1 | Fill #0

## 2018-02-11 NOTE — Telephone Encounter (Signed)
Last Visit: 01/02/18 Next Visit: 06/23/18  Okay to refill per Dr. Estanislado Pandy

## 2018-02-13 ENCOUNTER — Encounter: Payer: Self-pay | Admitting: Physical Therapy

## 2018-02-13 ENCOUNTER — Ambulatory Visit (INDEPENDENT_AMBULATORY_CARE_PROVIDER_SITE_OTHER): Payer: PRIVATE HEALTH INSURANCE | Admitting: Physical Therapy

## 2018-02-13 DIAGNOSIS — M545 Low back pain, unspecified: Secondary | ICD-10-CM

## 2018-02-13 DIAGNOSIS — M62830 Muscle spasm of back: Secondary | ICD-10-CM | POA: Diagnosis not present

## 2018-02-13 DIAGNOSIS — M6281 Muscle weakness (generalized): Secondary | ICD-10-CM

## 2018-02-13 NOTE — Therapy (Signed)
[place], [place], [address] Phone: [phone]   Fax:  [phone]  Physical Therapy Treatment  Patient Details  Name: [name] MRN: [id number] Date of Birth: 1966/01/30 Referring Provider: Fulton Mole, PA-C   Encounter Date: 02/13/2018  PT End of Session - 02/13/18 1532    Visit Number  11    Number of Visits  20 only has 16 approved via [facility]    Date for PT Re-Evaluation  03/17/18    Authorization - Visit Number  16    PT Start Time  0093    PT Stop Time  8182    PT Time Calculation (min)  62 min       Past Medical History:  Diagnosis Date  . ACUT MI ANTEROLAT WALL SUBSQT EPIS CARE   . Acute maxillary sinusitis   . ALLERGIC RHINITIS, SEASONAL   . ARTHRITIS, RHEUMATOID   . Atrial fibrillation ([place])    a. after CABG.  . CAD, ARTERY BYPASS GRAFT    a. DES to RCA in 2010 then LAD occlusion s/p CABG 3 06/07/2009 with LIMA to LAD, reverse SVG to D1, reverse SVG to distal RCA. b. Cath 05/08/2016 slightly hypodense region in the intermediate branch, however she had excellent flow, FFR was normal. Vein graft to PDA and the posterolateral branch is patent, patent LIMA to LAD, occluded SVG to diagonal.  . CARPAL TUNNEL SYNDROME, BILATERAL   . CHOLELITHIASIS   . Contrast media allergy   . DERMATITIS, ALLERGIC   . DIABETES MELLITUS, TYPE I    on insulin pump  . DIABETIC  RETINOPATHY   . Hiatal hernia   . HYPERLIPIDEMIA-MIXED   . HYPERTHYROIDISM   . Insulin pump in place   . MIGRAINE W/O AURA W/INTRACT W/STATUS MIGRAINOSUS 02/19/2008  . SINUS TACHYCARDIA 11/08/2010  . TRIGGER FINGER   . URI     Past Surgical History:  Procedure Laterality Date  . ABDOMINAL HYSTERECTOMY    . Caesarean section    . CARDIAC CATHETERIZATION N/A 05/08/2016   Procedure: Left Heart Cath and Cors/Grafts Angiography;  Surgeon: Burnell Blanks, MD;  Location: [facility];  Service: Cardiovascular;   Laterality: N/A;  . CARPAL TUNNEL RELEASE    . CHOLECYSTECTOMY    . CORONARY ARTERY BYPASS GRAFT    . VITRECTOMY      There were no vitals filed for this visit.  Subjective Assessment - 02/13/18 1534    Subjective  [name] reports her back started to hurt really bad yesterday.  She had to stop walking today due to pain, she is wondering if she is having a UTI as the pain is across her back.  No pain with  urination however    Pain Score  8     Pain Location  Back    Pain Orientation  Lower    Pain Descriptors / Indicators  Aching;Constant    Pain Type  Acute pain    Pain Onset  More than a month ago    Pain Frequency  Constant    Aggravating Factors   today everything    Pain Relieving Factors  nothing today.                       [facility] Adult PT Treatment/Exercise - 02/13/18 0001      Therapeutic Activites    Therapeutic Activities  Other Therapeutic Activities    Other Therapeutic Activities  attempted  foam roller upper lumbar, lower thoracic. to painful for her, moved to soft bolster and still uncomfortable.        Lumbar Exercises: Stretches   Lower Trunk Rotation  3 reps;30 seconds      Lumbar Exercises: Aerobic   Stationary Bike  Nustep L5 x 3 min (arms(10)/legs)      Lumbar Exercises: Prone   Other Prone Lumbar Exercises  20 reps T's with scapular retraction, 10 upper body lifts with hands by SIJ      Modalities   Modalities  Electrical Stimulation;Moist Heat;Ultrasound      Moist Heat Therapy   Number Minutes Moist Heat  20 Minutes    Moist Heat Location  -- upper lumbar/lower thoracic      Electrical Stimulation   Electrical Stimulation Location  bilat upper lumbar and thoracic paraspinals    Electrical Stimulation Action  IFC     Electrical Stimulation Parameters  to tolerance    Electrical Stimulation Goals  Pain;Tone      Ultrasound   Ultrasound Location  Rt lower thoracic    Ultrasound Parameters  combo us/stim 100%, 1.45mHz, 1.5w/cm2     Ultrasound Goals  Pain;Other (Comment) tone      Manual Therapy   Manual Therapy  Joint mobilization;Soft tissue mobilization    Manual therapy comments  pt prone     Joint Mobilization  grade II CPA mobs L4-T3, very tender and a little stiff.     Soft tissue mobilization  STM Rt lumbar and thoracic paraspinals                  PT Long Term Goals - 02/03/18 0828      PT LONG TERM GOAL #1   Title  I with advanced HEP (03/17/18)     Time  10    Period  Weeks    Status  Revised      PT LONG TERM GOAL #2   Title  improve FOTO =/< 41% limited ( 03/17/18)     Time  10    Period  Weeks    Status  Revised      PT LONG TERM GOAL #3   Title  improve Rt LE strength =/> 5-/5 to allow full return to work ( 03/17/18)     Time  10    Period  Weeks    Status  Revised      PT LONG TERM GOAL #4   Title  perform daily activities at work with no more than 2/10 back pain ( 03/17/18)     Time  10    Period  Weeks    Status  Revised      PT LONG TERM GOAL #5   Title  demo full lumbar ROM without pain to allow her to position herself for patient care at work ( 03/17/18)     Time  [place]    Status  Revised            Plan - 02/13/18 1732    Clinical Impression Statement  [name] had return of pain in the Rt upper lumbar/lower thoracic region with palpable muscle tightness L5-T8, she has some movement in her spine with CPA mobs however a high report of pain with this to make her unable to tolerate it.  If patient continues to have the level and amount of pain she is having she may benefit from further diagnostic testing as she should be  improving with therapy.  She does have osteopenia listed as a dx even though last bone density scan looks good, maybe possible she has a silent compression fx in the lower thoracic spine.    Rehab Potential  Good    PT Frequency  2x / week    PT Duration  12 weeks    PT Treatment/Interventions  Dry needling;Manual techniques;Moist  Heat;Ultrasound;Therapeutic activities;Patient/family education;Taping;Therapeutic exercise;Cryotherapy;Electrical Stimulation    PT Next Visit Plan  continue with manual work PRN, spinal mob with exercise and manaul, progress core stabilization.    Consulted and Agree with Plan of Care  Patient       Patient will benefit from skilled therapeutic intervention in order to improve the following deficits and impairments:  Improper body mechanics, Pain, Increased muscle spasms, Decreased range of motion, Decreased strength, Hypomobility, Obesity  Visit Diagnosis: Pain, lumbar region  Muscle spasm of back  Muscle weakness (generalized)     Problem List Patient Active Problem List   Diagnosis Date Noted  . Vitamin D deficiency 07/04/2017  . B12 deficiency 07/04/2017  . Abnormal CT scan 06/19/2017  . Gastroesophageal reflux disease 06/19/2017  . Antiplatelet or antithrombotic long-term use 06/19/2017  . Sinus pressure 03/15/2017  . Nasal congestion 03/15/2017  . Graves disease 02/01/2017  . Sleep difficulties 02/01/2017  . No energy 02/01/2017  . Osteopenia 02/01/2017  . Hypertension, essential 05/08/2016  . IDDM (insulin dependent diabetes mellitus) ([place]) 05/08/2016  . Coronary artery disease involving native coronary artery of native heart with angina pectoris ([place])   . HYPERTHYROIDISM 11/09/2010  . SINUS TACHYCARDIA 11/08/2010  . SHORTNESS OF BREATH 11/08/2010  . DERMATITIS, ALLERGIC 07/20/2010  . EDEMA 07/12/2010  . DIZZINESS 11/14/2009  . ATRIAL FIBRILLATION 07/20/2009  . CAD, ARTERY BYPASS GRAFT 06/07/2009  . ANGINA, STABLE/EXERTIONAL 06/02/2009  . HYPERLIPIDEMIA-MIXED 04/26/2009  . ACUT MI ANTEROLAT WALL SUBSQT EPIS CARE 04/26/2009  . CHEST PAIN 04/26/2009  . DIABETIC  RETINOPATHY 04/25/2009  . CARPAL TUNNEL SYNDROME, BILATERAL 04/25/2009  . TRIGGER FINGER 04/25/2009  . MIGRAINE W/O AURA W/INTRACT W/STATUS MIGRAINOSUS 02/19/2008  . ALLERGIC RHINITIS, SEASONAL  10/16/2006  . CHOLELITHIASIS 10/16/2006  . Rheumatoid arthritis ([place]) 10/16/2006    Jeral Pinch PT  02/13/2018, 5:34 PM  [facility] [place], [place], [address] Phone: [phone]   Fax:  [phone]  Name: [name] MRN: [id number] Date of Birth: 09-28-1966

## 2018-02-14 ENCOUNTER — Encounter: Payer: Self-pay | Admitting: Physical Therapy

## 2018-02-17 ENCOUNTER — Ambulatory Visit (INDEPENDENT_AMBULATORY_CARE_PROVIDER_SITE_OTHER): Payer: PRIVATE HEALTH INSURANCE | Admitting: Physical Therapy

## 2018-02-17 ENCOUNTER — Encounter: Payer: Self-pay | Admitting: Physical Therapy

## 2018-02-17 DIAGNOSIS — M545 Low back pain, unspecified: Secondary | ICD-10-CM

## 2018-02-17 DIAGNOSIS — M6281 Muscle weakness (generalized): Secondary | ICD-10-CM | POA: Diagnosis not present

## 2018-02-17 DIAGNOSIS — M62830 Muscle spasm of back: Secondary | ICD-10-CM | POA: Diagnosis not present

## 2018-02-17 NOTE — Therapy (Signed)
[place], [place], [address] Phone: [phone]   Fax:  [phone]  Physical Therapy Treatment  Patient Details  Name: [name] MRN: [id number] Date of Birth: Mar 11, 1966 Referring Provider: Fulton Mole, PA-C   Encounter Date: 02/17/2018  PT End of Session - 02/17/18 1105    Visit Number  12    Number of Visits  20 only 16 approved    Date for PT Re-Evaluation  03/17/18    Authorization - Visit Number  16    PT Start Time  1106    PT Stop Time  9242    PT Time Calculation (min)  58 min    Activity Tolerance  Patient tolerated treatment well       Past Medical History:  Diagnosis Date  . ACUT MI ANTEROLAT WALL SUBSQT EPIS CARE   . Acute maxillary sinusitis   . ALLERGIC RHINITIS, SEASONAL   . ARTHRITIS, RHEUMATOID   . Atrial fibrillation ([place])    a. after CABG.  . CAD, ARTERY BYPASS GRAFT    a. DES to RCA in 2010 then LAD occlusion s/p CABG 3 06/07/2009 with LIMA to LAD, reverse SVG to D1, reverse SVG to distal RCA. b. Cath 05/08/2016 slightly hypodense region in the intermediate branch, however she had excellent flow, FFR was normal. Vein graft to PDA and the posterolateral branch is patent, patent LIMA to LAD, occluded SVG to diagonal.  . CARPAL TUNNEL SYNDROME, BILATERAL   . CHOLELITHIASIS   . Contrast media allergy   . DERMATITIS, ALLERGIC   . DIABETES MELLITUS, TYPE I    on insulin pump  . DIABETIC  RETINOPATHY   . Hiatal hernia   . HYPERLIPIDEMIA-MIXED   . HYPERTHYROIDISM   . Insulin pump in place   . MIGRAINE W/O AURA W/INTRACT W/STATUS MIGRAINOSUS 02/19/2008  . SINUS TACHYCARDIA 11/08/2010  . TRIGGER FINGER   . URI     Past Surgical History:  Procedure Laterality Date  . ABDOMINAL HYSTERECTOMY    . Caesarean section    . CARDIAC CATHETERIZATION N/A 05/08/2016   Procedure: Left Heart Cath and Cors/Grafts Angiography;  Surgeon: Burnell Blanks, MD;  Location: [facility];  Service: Cardiovascular;  Laterality: N/A;  . CARPAL TUNNEL RELEASE    . CHOLECYSTECTOMY    . CORONARY ARTERY BYPASS GRAFT    . VITRECTOMY      There were no vitals filed for this visit.  Subjective Assessment - 02/17/18 1107    Subjective  [name] reports she is a little better today however has been off for 3 days     Patient Stated Goals  get back to full duty work and not reinjure    Currently in Pain?  Yes    Pain Score  5     Pain Location  Thoracic    Pain Orientation  Lower    Pain Descriptors / Indicators  Aching;Constant    Pain Type  Acute pain    Pain Onset  More than a month ago    Pain Frequency  Constant    Aggravating Factors   lifting    Pain Relieving Factors  rest and heat and tens                      [facility] Adult PT Treatment/Exercise - 02/17/18 0001      Lumbar Exercises: Stretches   Quadruped Mid Back Stretch  5 reps cat/cow  Lumbar Exercises: Aerobic   Stationary Bike  Nustep L5 x 5 min (arms(10)/legs)      Lumbar Exercises: Supine   Bridge  5 reps;20 reps first regular-warm up, articulating.       Lumbar Exercises: Sidelying   Other Sidelying Lumbar Exercises  20 reps upper body rotations holding  yellow band.       Modalities   Modalities  Electrical Stimulation;Moist Heat;Ultrasound      Moist Heat Therapy   Number Minutes Moist Heat  20 Minutes    Moist Heat Location  -- upper lumbar, lower thoracic      Electrical Stimulation   Electrical Stimulation Location  bilat upper lumbar and thoracic paraspinals    Electrical Stimulation Action  IFC to tolerance    Electrical Stimulation Goals  Pain;Tone      Ultrasound   Ultrasound Location  Rt lower thoracic paraspinals    Ultrasound Parameters  combo us/stim 100%, 1.33mHz, 1.5 w/cm2    Ultrasound Goals  Pain;Other (Comment)      Manual Therapy   Manual Therapy  Joint mobilization;Soft tissue mobilization    Manual therapy comments  pt prone     Joint  Mobilization  grade II-III CPA andbilat UPA mobs T5-L4, with focus at T11-L2 CPA and Lt UPA    Soft tissue mobilization  STM Rt lumbar and thoracic paraspinals                  PT Long Term Goals - 02/03/18 8101      PT LONG TERM GOAL #1   Title  I with advanced HEP (03/17/18)     Time  10    Period  Weeks    Status  Revised      PT LONG TERM GOAL #2   Title  improve FOTO =/< 41% limited ( 03/17/18)     Time  10    Period  Weeks    Status  Revised      PT LONG TERM GOAL #3   Title  improve Rt LE strength =/> 5-/5 to allow full return to work ( 03/17/18)     Time  10    Period  Weeks    Status  Revised      PT LONG TERM GOAL #4   Title  perform daily activities at work with no more than 2/10 back pain ( 03/17/18)     Time  10    Period  Weeks    Status  Revised      PT LONG TERM GOAL #5   Title  demo full lumbar ROM without pain to allow her to position herself for patient care at work ( 03/17/18)     Time  [place]    Status  Revised            Plan - 02/17/18 1148    Clinical Impression Statement  [name] had some decreased pain today however she has been off for the last 3 days.  The muscles were less tight today and she had improved spinal mobility.  Still tight and tender T11-L2.  She returns to work tomorrow.     Rehab Potential  Good    PT Frequency  2x / week    PT Duration  12 weeks    PT Treatment/Interventions  Dry needling;Manual techniques;Moist Heat;Ultrasound;Therapeutic activities;Patient/family education;Taping;Therapeutic exercise;Cryotherapy;Electrical Stimulation    PT Next Visit Plan  assess pain with return to modified work, possible  tape, I strips dynamic tape to support paraspinals.     Consulted and Agree with Plan of Care  Patient       Patient will benefit from skilled therapeutic intervention in order to improve the following deficits and impairments:  Improper body mechanics, Pain, Increased muscle spasms, Decreased  range of motion, Decreased strength, Hypomobility, Obesity  Visit Diagnosis: Pain, lumbar region  Muscle spasm of back  Muscle weakness (generalized)     Problem List Patient Active Problem List   Diagnosis Date Noted  . Vitamin D deficiency 07/04/2017  . B12 deficiency 07/04/2017  . Abnormal CT scan 06/19/2017  . Gastroesophageal reflux disease 06/19/2017  . Antiplatelet or antithrombotic long-term use 06/19/2017  . Sinus pressure 03/15/2017  . Nasal congestion 03/15/2017  . Graves disease 02/01/2017  . Sleep difficulties 02/01/2017  . No energy 02/01/2017  . Osteopenia 02/01/2017  . Hypertension, essential 05/08/2016  . IDDM (insulin dependent diabetes mellitus) ([place]) 05/08/2016  . Coronary artery disease involving native coronary artery of native heart with angina pectoris ([place])   . HYPERTHYROIDISM 11/09/2010  . SINUS TACHYCARDIA 11/08/2010  . SHORTNESS OF BREATH 11/08/2010  . DERMATITIS, ALLERGIC 07/20/2010  . EDEMA 07/12/2010  . DIZZINESS 11/14/2009  . ATRIAL FIBRILLATION 07/20/2009  . CAD, ARTERY BYPASS GRAFT 06/07/2009  . ANGINA, STABLE/EXERTIONAL 06/02/2009  . HYPERLIPIDEMIA-MIXED 04/26/2009  . ACUT MI ANTEROLAT WALL SUBSQT EPIS CARE 04/26/2009  . CHEST PAIN 04/26/2009  . DIABETIC  RETINOPATHY 04/25/2009  . CARPAL TUNNEL SYNDROME, BILATERAL 04/25/2009  . TRIGGER FINGER 04/25/2009  . MIGRAINE W/O AURA W/INTRACT W/STATUS MIGRAINOSUS 02/19/2008  . ALLERGIC RHINITIS, SEASONAL 10/16/2006  . CHOLELITHIASIS 10/16/2006  . Rheumatoid arthritis ([place]) 10/16/2006    [name] PT  02/17/2018, 11:51 AM  [facility] [place], [place], [address] Phone: [phone]   Fax:  [phone]  Name: [name] MRN: [id number] Date of Birth: 1966-09-16

## 2018-02-20 ENCOUNTER — Ambulatory Visit (INDEPENDENT_AMBULATORY_CARE_PROVIDER_SITE_OTHER): Payer: PRIVATE HEALTH INSURANCE | Admitting: Physical Therapy

## 2018-02-20 DIAGNOSIS — M6281 Muscle weakness (generalized): Secondary | ICD-10-CM

## 2018-02-20 DIAGNOSIS — M545 Low back pain, unspecified: Secondary | ICD-10-CM

## 2018-02-20 DIAGNOSIS — M62830 Muscle spasm of back: Secondary | ICD-10-CM | POA: Diagnosis not present

## 2018-02-20 NOTE — Therapy (Signed)
[place], [place], [address] Phone: [phone]   Fax:  [phone]  Physical Therapy Treatment  Patient Details  Name: [name] MRN: [id number] Date of Birth: 1966/07/28 Referring Provider: Fulton Mole, PA-C   Encounter Date: 02/20/2018  PT End of Session - 02/20/18 1536    Visit Number  13    Number of Visits  20 approved for 16 visits.     Date for PT Re-Evaluation  03/17/18    Authorization - Visit Number  16    PT Start Time  1532    PT Stop Time  1625    PT Time Calculation (min)  53 min       Past Medical History:  Diagnosis Date  . ACUT MI ANTEROLAT WALL SUBSQT EPIS CARE   . Acute maxillary sinusitis   . ALLERGIC RHINITIS, SEASONAL   . ARTHRITIS, RHEUMATOID   . Atrial fibrillation ([place])    a. after CABG.  . CAD, ARTERY BYPASS GRAFT    a. DES to RCA in 2010 then LAD occlusion s/p CABG 3 06/07/2009 with LIMA to LAD, reverse SVG to D1, reverse SVG to distal RCA. b. Cath 05/08/2016 slightly hypodense region in the intermediate branch, however she had excellent flow, FFR was normal. Vein graft to PDA and the posterolateral branch is patent, patent LIMA to LAD, occluded SVG to diagonal.  . CARPAL TUNNEL SYNDROME, BILATERAL   . CHOLELITHIASIS   . Contrast media allergy   . DERMATITIS, ALLERGIC   . DIABETES MELLITUS, TYPE I    on insulin pump  . DIABETIC  RETINOPATHY   . Hiatal hernia   . HYPERLIPIDEMIA-MIXED   . HYPERTHYROIDISM   . Insulin pump in place   . MIGRAINE W/O AURA W/INTRACT W/STATUS MIGRAINOSUS 02/19/2008  . SINUS TACHYCARDIA 11/08/2010  . TRIGGER FINGER   . URI     Past Surgical History:  Procedure Laterality Date  . ABDOMINAL HYSTERECTOMY    . Caesarean section    . CARDIAC CATHETERIZATION N/A 05/08/2016   Procedure: Left Heart Cath and Cors/Grafts Angiography;  Surgeon: Burnell Blanks, MD;  Location: [facility];  Service: Cardiovascular;   Laterality: N/A;  . CARPAL TUNNEL RELEASE    . CHOLECYSTECTOMY    . CORONARY ARTERY BYPASS GRAFT    . VITRECTOMY      There were no vitals filed for this visit.  Subjective Assessment - 02/20/18 1536    Subjective  Adison reports her new work assignment is helping, but her back still hurts. She is not lifting as much.  She only got 2 hours of sleep last night due to pain.      Currently in Pain?  Yes    Pain Score  6     Pain Location  Thoracic    Pain Orientation  Right    Pain Descriptors / Indicators  Constant;Sharp;Aching    Pain Radiating Towards  some radiating into Lt leg.     Aggravating Factors   lifting, bending.     Pain Relieving Factors  rest, heat, TENS         [facility] PT Assessment - 02/20/18 0001      Assessment   Medical Diagnosis  Lumbar strain    Referring Provider  Fulton Mole, PA-C    Onset Date/Surgical Date  10/14/17    Hand Dominance  Right    Next MD Visit  PRN after therapy  [place] Adult PT Treatment/Exercise - 02/20/18 0001      Lumbar Exercises: Stretches   Quadruped Mid Back Stretch  5 reps cat/cow      Lumbar Exercises: Aerobic   Stationary Bike  NuStep L5 x 4.25 min  PTA present to discuss progress      Lumbar Exercises: Sidelying   Other Sidelying Lumbar Exercises  10 reps upper body rotations holding  yellow band.       Lumbar Exercises: Prone   Opposite Arm/Leg Raise  Right arm/Left leg;Left arm/Right leg;10 reps;2 seconds arm positioned modified for improved comfort.       Lumbar Exercises: Quadruped   Other Quadruped Lumbar Exercises  childs pose x 3 reps       Modalities   Modalities  Electrical Stimulation;Ultrasound;Cryotherapy      Moist Heat Therapy   Number Minutes Moist Heat  --    Moist Heat Location  --      Cryotherapy   Number Minutes Cryotherapy  20 Minutes    Cryotherapy Location  -- thoracic spine    Type of Cryotherapy  Ice pack      Electrical Stimulation   Electrical Stimulation Location  bilat  upper lumbar and thoracic paraspinals    Electrical Stimulation Action  IFC    Electrical Stimulation Parameters  to tolerance    Electrical Stimulation Goals  Pain;Tone      Ultrasound   Ultrasound Location  Lt mid thoracic paraspinals    Ultrasound Parameters  50%, 1.2 w/cm2, 8 min     Ultrasound Goals  Edema;Pain      Manual Therapy   Manual therapy comments  Star pattern of Rock tape with 25% stretch applied to Lt mid-thoracic paraspinals to decompress tissue and decrease pain.        Hamstring and piriformis stretch x 3 reps each leg.            PT Long Term Goals - 02/03/18 0454      PT LONG TERM GOAL #1   Title  I with advanced HEP (03/17/18)     Time  10    Period  Weeks    Status  Revised      PT LONG TERM GOAL #2   Title  improve FOTO =/< 41% limited ( 03/17/18)     Time  10    Period  Weeks    Status  Revised      PT LONG TERM GOAL #3   Title  improve Rt LE strength =/> 5-/5 to allow full return to work ( 03/17/18)     Time  10    Period  Weeks    Status  Revised      PT LONG TERM GOAL #4   Title  perform daily activities at work with no more than 2/10 back pain ( 03/17/18)     Time  10    Period  Weeks    Status  Revised      PT LONG TERM GOAL #5   Title  demo full lumbar ROM without pain to allow her to position herself for patient care at work ( 03/17/18)     Time  10    Period  Weeks    Status  Revised            Plan - 02/20/18 1541    Clinical Impression Statement  Pt unable to tolerate arms on NuStep due to increased pain and after 4.5 min, performing legs only  increased pain as well.  She tolerated all other exercises without increase in pain.  Pt's mid thoracic paraspinals were tender to touch on Lt and appeared slightly swollen.  Switched to pulsed [place], and ice with estim.  Trial of Rock tape applied to Lt thoracic paraspinals. Pt has reported temporary relief with therapy, pain worsens with prolonged standing/ work load; recommended  return to MD for further evaluation.     Rehab Potential  Good    PT Frequency  2x / week    PT Duration  12 weeks    PT Treatment/Interventions  Dry needling;Manual techniques;Moist Heat;Ultrasound;Therapeutic activities;Patient/family education;Taping;Therapeutic exercise;Cryotherapy;Electrical Stimulation    PT Next Visit Plan  assess response to tape.  Pt to follow up with MD/case manager prior to next therapy appt.     Consulted and Agree with Plan of Care  Patient       Patient will benefit from skilled therapeutic intervention in order to improve the following deficits and impairments:  Improper body mechanics, Pain, Increased muscle spasms, Decreased range of motion, Decreased strength, Hypomobility, Obesity  Visit Diagnosis: Pain, lumbar region  Muscle spasm of back  Muscle weakness (generalized)     Problem List Patient Active Problem List   Diagnosis Date Noted  . Vitamin D deficiency 07/04/2017  . B12 deficiency 07/04/2017  . Abnormal CT scan 06/19/2017  . Gastroesophageal reflux disease 06/19/2017  . Antiplatelet or antithrombotic long-term use 06/19/2017  . Sinus pressure 03/15/2017  . Nasal congestion 03/15/2017  . Graves disease 02/01/2017  . Sleep difficulties 02/01/2017  . No energy 02/01/2017  . Osteopenia 02/01/2017  . Hypertension, essential 05/08/2016  . IDDM (insulin dependent diabetes mellitus) ([place]) 05/08/2016  . Coronary artery disease involving native coronary artery of native heart with angina pectoris ([place])   . HYPERTHYROIDISM 11/09/2010  . SINUS TACHYCARDIA 11/08/2010  . SHORTNESS OF BREATH 11/08/2010  . DERMATITIS, ALLERGIC 07/20/2010  . EDEMA 07/12/2010  . DIZZINESS 11/14/2009  . ATRIAL FIBRILLATION 07/20/2009  . CAD, ARTERY BYPASS GRAFT 06/07/2009  . ANGINA, STABLE/EXERTIONAL 06/02/2009  . HYPERLIPIDEMIA-MIXED 04/26/2009  . ACUT MI ANTEROLAT WALL SUBSQT EPIS CARE 04/26/2009  . CHEST PAIN 04/26/2009  . DIABETIC  RETINOPATHY 04/25/2009   . CARPAL TUNNEL SYNDROME, BILATERAL 04/25/2009  . TRIGGER FINGER 04/25/2009  . MIGRAINE W/O AURA W/INTRACT W/STATUS MIGRAINOSUS 02/19/2008  . ALLERGIC RHINITIS, SEASONAL 10/16/2006  . CHOLELITHIASIS 10/16/2006  . Rheumatoid arthritis ([place]) 10/16/2006   Kerin Perna, PTA 02/20/18 6:10 PM  [facility] [place], [place], [address] Phone: [phone]   Fax:  [phone]  Name: MIGUEL MEDAL MRN: [id number] Date of Birth: [birth year]

## 2018-02-21 ENCOUNTER — Telehealth: Payer: Self-pay | Admitting: *Deleted

## 2018-02-22 ENCOUNTER — Other Ambulatory Visit (HOSPITAL_BASED_OUTPATIENT_CLINIC_OR_DEPARTMENT_OTHER): Payer: Self-pay | Admitting: Gerontology

## 2018-02-22 DIAGNOSIS — S3992XA Unspecified injury of lower back, initial encounter: Principal | ICD-10-CM

## 2018-02-22 DIAGNOSIS — M549 Dorsalgia, unspecified: Secondary | ICD-10-CM

## 2018-02-24 ENCOUNTER — Ambulatory Visit (INDEPENDENT_AMBULATORY_CARE_PROVIDER_SITE_OTHER): Payer: PRIVATE HEALTH INSURANCE | Admitting: Physical Therapy

## 2018-02-24 DIAGNOSIS — M545 Low back pain, unspecified: Secondary | ICD-10-CM

## 2018-02-24 DIAGNOSIS — M6281 Muscle weakness (generalized): Secondary | ICD-10-CM | POA: Diagnosis not present

## 2018-02-24 DIAGNOSIS — M62830 Muscle spasm of back: Secondary | ICD-10-CM | POA: Diagnosis not present

## 2018-02-24 NOTE — Therapy (Signed)
[facility] [place]  [place], [place], [address] Phone: [phone]   Fax:  [phone]  Physical Therapy Treatment  Patient Details  Name: [name] MRN: [id number] Date of Birth: 1966/11/07 Referring Provider: Fulton Mole, PA-C   Encounter Date: 02/24/2018  PT End of Session - 02/24/18 1019    Visit Number  14    Number of Visits  20    Date for PT Re-Evaluation  03/17/18    Authorization - Visit Number  16    PT Start Time  1016    PT Stop Time  1115    PT Time Calculation (min)  59 min    Activity Tolerance  Patient tolerated treatment well    Behavior During Therapy  [facility] for tasks assessed/performed       Past Medical History:  Diagnosis Date  . ACUT MI ANTEROLAT WALL SUBSQT EPIS CARE   . Acute maxillary sinusitis   . ALLERGIC RHINITIS, SEASONAL   . ARTHRITIS, RHEUMATOID   . Atrial fibrillation ([place])    a. after CABG.  . CAD, ARTERY BYPASS GRAFT    a. DES to RCA in 2010 then LAD occlusion s/p CABG 3 06/07/2009 with LIMA to LAD, reverse SVG to D1, reverse SVG to distal RCA. b. Cath 05/08/2016 slightly hypodense region in the intermediate branch, however she had excellent flow, FFR was normal. Vein graft to PDA and the posterolateral branch is patent, patent LIMA to LAD, occluded SVG to diagonal.  . CARPAL TUNNEL SYNDROME, BILATERAL   . CHOLELITHIASIS   . Contrast media allergy   . DERMATITIS, ALLERGIC   . DIABETES MELLITUS, TYPE I    on insulin pump  . DIABETIC  RETINOPATHY   . Hiatal hernia   . HYPERLIPIDEMIA-MIXED   . HYPERTHYROIDISM   . Insulin pump in place   . MIGRAINE W/O AURA W/INTRACT W/STATUS MIGRAINOSUS 02/19/2008  . SINUS TACHYCARDIA 11/08/2010  . TRIGGER FINGER   . URI     Past Surgical History:  Procedure Laterality Date  . ABDOMINAL HYSTERECTOMY    . Caesarean section    . CARDIAC CATHETERIZATION N/A 05/08/2016   Procedure: Left Heart Cath and Cors/Grafts Angiography;  Surgeon:  Burnell Blanks, MD;  Location: [place] [facility];  Service: Cardiovascular;  Laterality: N/A;  . CARPAL TUNNEL RELEASE    . CHOLECYSTECTOMY    . CORONARY ARTERY BYPASS GRAFT    . VITRECTOMY      There were no vitals filed for this visit.  Subjective Assessment - 02/24/18 1020    Subjective  Pt reports she didn't get out of bed Saturday since she hadn't gotten any sleep for a few days. She took her ~10# sewing machine to neighbor (holding it with both arms) and this flared her back up again. With rest over the weekend, the back pain has decreased some.  She didn't really feel a difference with the Rock tape on.  She has MRI scheduled for Saturday.     Patient Stated Goals  get back to full duty work and not reinjure    Currently in Pain?  Yes    Pain Score  5     Pain Location  Back thoracic to low back    Pain Orientation  Right    Pain Descriptors / Indicators  Aching    Pain Radiating Towards  no radiating pain.     Aggravating Factors   lifting, bending    Pain Relieving Factors  rest, ice, TENS         [facility] PT Assessment - 02/24/18 0001      Assessment   Medical Diagnosis  Lumbar strain    Referring Provider  Fulton Mole, PA-C    Onset Date/Surgical Date  10/14/17    Hand Dominance  Right    Next MD Visit  PRN after therapy       [facility] Adult PT Treatment/Exercise - 02/24/18 0001      Lumbar Exercises: Aerobic   Stationary Bike  NuStep L5 x 5 min  PTA present to discuss progress.No arms-inc pain.      Lumbar Exercises: Machines for Strengthening   Cybex Knee Extension  --      Lumbar Exercises: Supine   Bridge  10 reps;5 seconds core engaged, articulating    Other Supine Lumbar Exercises  overhead pull with yellow band in bilat hands x 10 reps       Lumbar Exercises: Sidelying   Other Sidelying Lumbar Exercises  10 reps upper body rotation with yellow band in Rt sidelying; no band in Lt sidelying and modified to Y Rt arm position due to increased pain in  Rt mid-back.       Lumbar Exercises: Prone   Straight Leg Raise  10 reps    Other Prone Lumbar Exercises  10 reps T's with scapular retraction, 10 upper body lifts with hands at side, 2 sets .       Lumbar Exercises: Quadruped   Madcat/Old Horse  5 reps      Shoulder Exercises: Stretch   Other Shoulder Stretches  mid and high level doorway stretch x 30 sec x 2 reps each.       Cryotherapy   Number Minutes Cryotherapy  15 Minutes    Cryotherapy Location  -- thoracic spine    Type of Cryotherapy  Ice pack      Electrical Stimulation   Electrical Stimulation Location  bilat mid thoracic paraspinals and Rt lower thoracic paraspinals    Electrical Stimulation Action  IFC    Electrical Stimulation Parameters  to tolerance    Electrical Stimulation Goals  Pain;Tone      Ultrasound   Ultrasound Location  Rt mid thoracic paraspinals    Ultrasound Parameters  50%. 1.2w/cm2, 8 min     Ultrasound Goals  Edema;Pain      Manual Therapy   Soft tissue mobilization  STM Rt lumbar and thoracic paraspinals                  PT Long Term Goals - 02/24/18 1258      PT LONG TERM GOAL #1   Title  I with advanced HEP (03/17/18)     Time  10    Period  Weeks    Status  On-going      PT LONG TERM GOAL #2   Title  improve FOTO =/< 41% limited ( 03/17/18)     Time  10    Period  Weeks    Status  On-going      PT LONG TERM GOAL #3   Title  improve Rt LE strength =/> 5-/5 to allow full return to work ( 03/17/18)     Time  10    Period  Weeks    Status  On-going      PT LONG TERM GOAL #4   Title  perform daily activities at work with no more than 2/10 back pain ( 03/17/18)  Time  10    Period  Weeks    Status  On-going      PT LONG TERM GOAL #5   Title  demo full lumbar ROM without pain to allow her to position herself for patient care at work ( 03/17/18)     Time  10    Period  Weeks    Status  On-going            Plan - 02/24/18 1037    Clinical Impression  Statement  Pt has pain in Rt low back with LTR, knees to Lt and Lt sidelying, Rt thoracic rotation.   Rt mid thoracic region not as swollen as last visit, but still very point tender.  She had much guarding with attempts at manual therapy to area.  Pt tolerated all exercises well, with some minor modifications. She reported decreased pain level at end of session.     Rehab Potential  Good    PT Frequency  2x / week    PT Duration  12 weeks    PT Treatment/Interventions  Dry needling;Manual techniques;Moist Heat;Ultrasound;Therapeutic activities;Patient/family education;Taping;Therapeutic exercise;Cryotherapy;Electrical Stimulation    PT Next Visit Plan  cont spinal stabilization and modalities as indicated.     Consulted and Agree with Plan of Care  Patient       Patient will benefit from skilled therapeutic intervention in order to improve the following deficits and impairments:  Improper body mechanics, Pain, Increased muscle spasms, Decreased range of motion, Decreased strength, Hypomobility, Obesity  Visit Diagnosis: Pain, lumbar region  Muscle spasm of back  Muscle weakness (generalized)     Problem List Patient Active Problem List   Diagnosis Date Noted  . Vitamin D deficiency 07/04/2017  . B12 deficiency 07/04/2017  . Abnormal CT scan 06/19/2017  . Gastroesophageal reflux disease 06/19/2017  . Antiplatelet or antithrombotic long-term use 06/19/2017  . Sinus pressure 03/15/2017  . Nasal congestion 03/15/2017  . Graves disease 02/01/2017  . Sleep difficulties 02/01/2017  . No energy 02/01/2017  . Osteopenia 02/01/2017  . Hypertension, essential 05/08/2016  . IDDM (insulin dependent diabetes mellitus) ([place]) 05/08/2016  . Coronary artery disease involving native coronary artery of native heart with angina pectoris ([place])   . HYPERTHYROIDISM 11/09/2010  . SINUS TACHYCARDIA 11/08/2010  . SHORTNESS OF BREATH 11/08/2010  . DERMATITIS, ALLERGIC 07/20/2010  . EDEMA 07/12/2010   . DIZZINESS 11/14/2009  . ATRIAL FIBRILLATION 07/20/2009  . CAD, ARTERY BYPASS GRAFT 06/07/2009  . ANGINA, STABLE/EXERTIONAL 06/02/2009  . HYPERLIPIDEMIA-MIXED 04/26/2009  . ACUT MI ANTEROLAT WALL SUBSQT EPIS CARE 04/26/2009  . CHEST PAIN 04/26/2009  . DIABETIC  RETINOPATHY 04/25/2009  . CARPAL TUNNEL SYNDROME, BILATERAL 04/25/2009  . TRIGGER FINGER 04/25/2009  . MIGRAINE W/O AURA W/INTRACT W/STATUS MIGRAINOSUS 02/19/2008  . ALLERGIC RHINITIS, SEASONAL 10/16/2006  . CHOLELITHIASIS 10/16/2006  . Rheumatoid arthritis ([place]) 10/16/2006   Kerin Perna, PTA 02/24/18 12:58 PM  [place], [place], [address] Phone: [phone]   Fax:  [phone]  Name: [name] MRN: [id number] Date of Birth: [birth year]

## 2018-02-27 ENCOUNTER — Ambulatory Visit (INDEPENDENT_AMBULATORY_CARE_PROVIDER_SITE_OTHER): Payer: PRIVATE HEALTH INSURANCE | Admitting: Physical Therapy

## 2018-02-27 DIAGNOSIS — M62830 Muscle spasm of back: Secondary | ICD-10-CM | POA: Diagnosis not present

## 2018-02-27 DIAGNOSIS — M6281 Muscle weakness (generalized): Secondary | ICD-10-CM

## 2018-02-27 DIAGNOSIS — M545 Low back pain, unspecified: Secondary | ICD-10-CM

## 2018-02-27 NOTE — Therapy (Addendum)
[place], [place], [address] Phone: [phone]   Fax:  [phone]  Physical Therapy Treatment  Patient Details  Name: [name] MRN: [id number] Date of Birth: 1966/11/14 Referring Provider: Fulton Mole- PA-C   Encounter Date: 02/27/2018  PT End of Session - 02/27/18 1627    Visit Number  15    Number of Visits  20    Date for PT Re-Evaluation  03/17/18    Authorization - Visit Number  16    PT Start Time  1618    PT Stop Time  1718    PT Time Calculation (min)  60 min    Activity Tolerance  Patient limited by pain    Behavior During Therapy  [facility] for tasks assessed/performed       Past Medical History:  Diagnosis Date  . ACUT MI ANTEROLAT WALL SUBSQT EPIS CARE   . Acute maxillary sinusitis   . ALLERGIC RHINITIS, SEASONAL   . ARTHRITIS, RHEUMATOID   . Atrial fibrillation ([place])    a. after CABG.  . CAD, ARTERY BYPASS GRAFT    a. DES to RCA in 2010 then LAD occlusion s/p CABG 3 06/07/2009 with LIMA to LAD, reverse SVG to D1, reverse SVG to distal RCA. b. Cath 05/08/2016 slightly hypodense region in the intermediate branch, however she had excellent flow, FFR was normal. Vein graft to PDA and the posterolateral branch is patent, patent LIMA to LAD, occluded SVG to diagonal.  . CARPAL TUNNEL SYNDROME, BILATERAL   . CHOLELITHIASIS   . Contrast media allergy   . DERMATITIS, ALLERGIC   . DIABETES MELLITUS, TYPE I    on insulin pump  . DIABETIC  RETINOPATHY   . Hiatal hernia   . HYPERLIPIDEMIA-MIXED   . HYPERTHYROIDISM   . Insulin pump in place   . MIGRAINE W/O AURA W/INTRACT W/STATUS MIGRAINOSUS 02/19/2008  . SINUS TACHYCARDIA 11/08/2010  . TRIGGER FINGER   . URI     Past Surgical History:  Procedure Laterality Date  . ABDOMINAL HYSTERECTOMY    . Caesarean section    . CARDIAC CATHETERIZATION N/A 05/08/2016   Procedure: Left Heart Cath and Cors/Grafts Angiography;  Surgeon: Burnell Blanks, MD;  Location: [place] [facility];  Service: Cardiovascular;  Laterality: N/A;  . CARPAL TUNNEL RELEASE    . CHOLECYSTECTOMY    . CORONARY ARTERY BYPASS GRAFT    . VITRECTOMY      There were no vitals filed for this visit.  Subjective Assessment - 02/27/18 1633    Subjective  [name] reports she feels worse today.  Overall things have gotten worse over last 2-3 wks.  She bends over a lot more in her new dept.  She states even walking today bothered her mid-back.  It is interferring with her sleep.     Currently in Pain?  Yes    Pain Score  7     Pain Location  Back thoracic    Pain Orientation  Right    Pain Descriptors / Indicators  Aching;Tightness;Sharp    Aggravating Factors   bending over, lifting     Pain Relieving Factors  rest, ice, TENS.          [facility] PT Assessment - 02/27/18 0001      Assessment   Medical Diagnosis  Lumbar strain    Referring Provider  Fulton Mole- PA-C    Onset Date/Surgical Date  10/14/17    Hand Dominance  Right    Next MD Visit  PRN after therapy        [facility] Adult PT Treatment/Exercise - 02/27/18 0001      Lumbar Exercises: Stretches   Passive Hamstring Stretch  Right;Left;30 seconds;3 reps    Piriformis Stretch  Right;Left;2 reps;30 seconds      Lumbar Exercises: Aerobic   Stationary Bike  NuStep L5 x 5 min  PTA present to discuss progress.No arms-inc pain.      Lumbar Exercises: Supine   Other Supine Lumbar Exercises  Meeks series- 5 sec holds x 10 reps of each : shoulder press, leg lengthener, leg press (both R/ L), head press.     Other Supine Lumbar Exercises  prolonged snowangel position - (approx 90 deg of abdct bilat),  3 reps.       Lumbar Exercises: Sidelying   Other Sidelying Lumbar Exercises  -- held all rotational activities      Cryotherapy   Number Minutes Cryotherapy  15 Minutes    Cryotherapy Location  -- thoracic spine    Type of Cryotherapy  Ice pack      Electrical Stimulation   Electrical  Stimulation Location  bilat mid thoracic paraspinals and Rt lower thoracic paraspinals    Electrical Stimulation Action  IFC    Electrical Stimulation Parameters  to tolerance     Electrical Stimulation Goals  Pain      Ultrasound   Ultrasound Location  Rt thoracic paraspinals (just below bra strap)     Ultrasound Parameters  50%, 1.2 w/cm2, 8 min    Ultrasound Goals  Pain;Edema                  PT Long Term Goals - 02/24/18 1258      PT LONG TERM GOAL #1   Title  I with advanced HEP (03/17/18)     Time  10    Period  Weeks    Status  On-going      PT LONG TERM GOAL #2   Title  improve FOTO =/< 41% limited ( 03/17/18)     Time  10    Period  Weeks    Status  On-going      PT LONG TERM GOAL #3   Title  improve Rt LE strength =/> 5-/5 to allow full return to work ( 03/17/18)     Time  10    Period  Weeks    Status  On-going      PT LONG TERM GOAL #4   Title  perform daily activities at work with no more than 2/10 back pain ( 03/17/18)     Time  10    Period  Weeks    Status  On-going      PT LONG TERM GOAL #5   Title  demo full lumbar ROM without pain to allow her to position herself for patient care at work ( 03/17/18)     Time  10    Period  Weeks    Status  On-going            Plan - 02/27/18 1642    Clinical Impression Statement  Pt's symptoms have progressively worsened over last 2 wks.  Introduced pt to [employer] decompression exercises and used modalities for pain control.  Pt having MRI in 2 days.  Will hold therapy until after MRI.     PT Frequency  2x / week    PT Duration  12 weeks  PT Treatment/Interventions  Dry needling;Manual techniques;Moist Heat;Ultrasound;Therapeutic activities;Patient/family education;Taping;Therapeutic exercise;Cryotherapy;Electrical Stimulation    PT Next Visit Plan  hold therapy until after MRI and advisement from Dr.     Geanie Cooley and Agree with Plan of Care  Patient       Patient will benefit from skilled  therapeutic intervention in order to improve the following deficits and impairments:  Improper body mechanics, Pain, Increased muscle spasms, Decreased range of motion, Decreased strength, Hypomobility, Obesity  Visit Diagnosis: Pain, lumbar region  Muscle spasm of back  Muscle weakness (generalized)     Problem List Patient Active Problem List   Diagnosis Date Noted  . Vitamin D deficiency 07/04/2017  . B12 deficiency 07/04/2017  . Abnormal CT scan 06/19/2017  . Gastroesophageal reflux disease 06/19/2017  . Antiplatelet or antithrombotic long-term use 06/19/2017  . Sinus pressure 03/15/2017  . Nasal congestion 03/15/2017  . Graves disease 02/01/2017  . Sleep difficulties 02/01/2017  . No energy 02/01/2017  . Osteopenia 02/01/2017  . Hypertension, essential 05/08/2016  . IDDM (insulin dependent diabetes mellitus) ([place]) 05/08/2016  . Coronary artery disease involving native coronary artery of native heart with angina pectoris ([place])   . HYPERTHYROIDISM 11/09/2010  . SINUS TACHYCARDIA 11/08/2010  . SHORTNESS OF BREATH 11/08/2010  . DERMATITIS, ALLERGIC 07/20/2010  . EDEMA 07/12/2010  . DIZZINESS 11/14/2009  . ATRIAL FIBRILLATION 07/20/2009  . CAD, ARTERY BYPASS GRAFT 06/07/2009  . ANGINA, STABLE/EXERTIONAL 06/02/2009  . HYPERLIPIDEMIA-MIXED 04/26/2009  . ACUT MI ANTEROLAT WALL SUBSQT EPIS CARE 04/26/2009  . CHEST PAIN 04/26/2009  . DIABETIC  RETINOPATHY 04/25/2009  . CARPAL TUNNEL SYNDROME, BILATERAL 04/25/2009  . TRIGGER FINGER 04/25/2009  . MIGRAINE W/O AURA W/INTRACT W/STATUS MIGRAINOSUS 02/19/2008  . ALLERGIC RHINITIS, SEASONAL 10/16/2006  . CHOLELITHIASIS 10/16/2006  . Rheumatoid arthritis ([place]) 10/16/2006   Kerin Perna, PTA 02/27/18 6:07 PM  [place], [place], [address] Phone: [phone]   Fax:  [phone]  Name: [name] MRN: [id number] Date of Birth:  [birth year]   PHYSICAL THERAPY DISCHARGE SUMMARY  Visits from Start of Care: 15  Current functional level related to goals / functional outcomes: See above for function at last visit   Remaining deficits: Unknown, current deficits   Education / Equipment: HEP Plan: Patient agrees to discharge.  Patient goals were not met. Patient is being discharged due to a change in medical status. Pt is being referred to a surgeon due to lack of progress. ?????    Jeral Pinch, PT 04/02/18 8:36 AM

## 2018-02-27 NOTE — Patient Instructions (Signed)
  Shoulder Press   Press both shoulders down. Hold ___ seconds. Repeat ___ times. Press one shoulder down. Hold ___ seconds Repeat ___ times. Do other shoulder. If unable to press one or both shoulders, lie in position a few sessions until you can. Surface: floor

## 2018-03-01 ENCOUNTER — Ambulatory Visit (HOSPITAL_BASED_OUTPATIENT_CLINIC_OR_DEPARTMENT_OTHER): Payer: PRIVATE HEALTH INSURANCE

## 2018-03-01 ENCOUNTER — Ambulatory Visit (HOSPITAL_BASED_OUTPATIENT_CLINIC_OR_DEPARTMENT_OTHER)
Admission: RE | Admit: 2018-03-01 | Discharge: 2018-03-01 | Disposition: A | Discharge status: Home / Self Care | Payer: PRIVATE HEALTH INSURANCE | Source: Ambulatory Visit | Attending: Gerontology | Admitting: Gerontology

## 2018-03-01 ENCOUNTER — Encounter (HOSPITAL_BASED_OUTPATIENT_CLINIC_OR_DEPARTMENT_OTHER): Payer: Self-pay

## 2018-03-03 DIAGNOSIS — E1039 Type 1 diabetes mellitus with other diabetic ophthalmic complication: Secondary | ICD-10-CM | POA: Diagnosis not present

## 2018-03-03 DIAGNOSIS — E1065 Type 1 diabetes mellitus with hyperglycemia: Secondary | ICD-10-CM | POA: Diagnosis not present

## 2018-03-07 MED FILL — LEFLUNOMIDE 20 MG TABLET: 20 | 30 days supply | Qty: 30 | Fill #2

## 2018-03-07 MED FILL — HumaLOG 100 UNIT/ML SOLN: 100 | 89 days supply | Qty: 80 | Fill #0

## 2018-03-07 MED FILL — CYCLOBENZAPRINE HCL 10 MG T: 10 | 30 days supply | Qty: 30 | Fill #1

## 2018-03-07 MED FILL — NABUMETONE 750 MG TABLET: 750 | 30 days supply | Qty: 60 | Fill #2

## 2018-03-07 MED FILL — ISOSORBIDE MN ER 30 MG TAB: 30 | 90 days supply | Qty: 90 | Fill #2

## 2018-03-07 MED FILL — CONTOUR NEXT STRIPS: 34 days supply | Qty: 200 | Fill #0

## 2018-03-07 MED FILL — raNITIdine HCL 300 MG TABS: 300 | 30 days supply | Qty: 30 | Fill #4

## 2018-03-07 MED FILL — MONTELUKAST SOD 10 MG TAB: 10 | 30 days supply | Qty: 30 | Fill #3

## 2018-03-07 MED FILL — METOPROLOL TARTRATE 25 MG T: 25 | 90 days supply | Qty: 180 | Fill #2

## 2018-03-08 ENCOUNTER — Ambulatory Visit (HOSPITAL_BASED_OUTPATIENT_CLINIC_OR_DEPARTMENT_OTHER)
Admission: RE | Admit: 2018-03-08 | Discharge: 2018-03-08 | Disposition: A | Discharge status: Home / Self Care | Payer: PRIVATE HEALTH INSURANCE | Source: Ambulatory Visit | Attending: Gerontology | Admitting: Gerontology

## 2018-03-08 DIAGNOSIS — M5124 Other intervertebral disc displacement, thoracic region: Secondary | ICD-10-CM | POA: Diagnosis not present

## 2018-03-08 DIAGNOSIS — M549 Dorsalgia, unspecified: Secondary | ICD-10-CM | POA: Diagnosis present

## 2018-03-08 DIAGNOSIS — S3992XA Unspecified injury of lower back, initial encounter: Secondary | ICD-10-CM

## 2018-03-10 MED FILL — SIMPONI 100 MG/ML SOAJ: 100 | 30 days supply | Qty: 1 | Fill #1

## 2018-03-28 ENCOUNTER — Ambulatory Visit (INDEPENDENT_AMBULATORY_CARE_PROVIDER_SITE_OTHER): Payer: 59

## 2018-03-28 ENCOUNTER — Other Ambulatory Visit: Payer: Self-pay | Admitting: Physician Assistant

## 2018-03-28 DIAGNOSIS — Z1231 Encounter for screening mammogram for malignant neoplasm of breast: Secondary | ICD-10-CM

## 2018-03-30 DIAGNOSIS — E1065 Type 1 diabetes mellitus with hyperglycemia: Secondary | ICD-10-CM | POA: Diagnosis not present

## 2018-03-30 DIAGNOSIS — E1039 Type 1 diabetes mellitus with other diabetic ophthalmic complication: Secondary | ICD-10-CM | POA: Diagnosis not present

## 2018-03-30 NOTE — Progress Notes (Signed)
Call pt: normal mammogram. Follow up in 1 year.

## 2018-04-01 DIAGNOSIS — E1039 Type 1 diabetes mellitus with other diabetic ophthalmic complication: Secondary | ICD-10-CM | POA: Diagnosis not present

## 2018-04-01 DIAGNOSIS — E1065 Type 1 diabetes mellitus with hyperglycemia: Secondary | ICD-10-CM | POA: Diagnosis not present

## 2018-04-02 ENCOUNTER — Other Ambulatory Visit: Payer: Self-pay | Admitting: Pharmacist

## 2018-04-02 MED ORDER — GOLIMUMAB 100 MG/ML ~~LOC~~ SOAJ: SUBCUTANEOUS | 0 refills | Status: DC

## 2018-04-07 ENCOUNTER — Other Ambulatory Visit: Payer: Self-pay | Admitting: Rheumatology

## 2018-04-07 MED FILL — MONTELUKAST SOD 10 MG TAB: 10 | 30 days supply | Qty: 30 | Fill #4

## 2018-04-07 MED FILL — PANTOPRAZOLE SOD DR 40 MG T: 40 | 90 days supply | Qty: 90 | Fill #3

## 2018-04-07 MED FILL — LEFLUNOMIDE 20 MG TABLET: 20 | 30 days supply | Qty: 30 | Fill #0

## 2018-04-07 MED FILL — raNITIdine HCL 300 MG TABS: 300 | 30 days supply | Qty: 30 | Fill #5

## 2018-04-07 MED FILL — LOSARTAN POTASSIUM 50 MG TA: 50 | 90 days supply | Qty: 90 | Fill #2

## 2018-04-07 MED FILL — CYCLOBENZAPRINE HCL 10 MG T: 10 | 30 days supply | Qty: 30 | Fill #2

## 2018-04-07 MED FILL — NABUMETONE 750 MG TABLET: 750 | 30 days supply | Qty: 60 | Fill #3

## 2018-04-07 MED FILL — SIMPONI 100 MG/ML SOAJ: 100 | 30 days supply | Qty: 1 | Fill #0

## 2018-04-07 NOTE — Telephone Encounter (Signed)
Last visit: 01/02/2018 Next visit: 06/23/2018 Labs: 01/02/2018 WNL  Okay to refill per Dr. Estanislado Pandy.

## 2018-04-10 DIAGNOSIS — S39012A Strain of muscle, fascia and tendon of lower back, initial encounter: Secondary | ICD-10-CM | POA: Insufficient documentation

## 2018-04-10 DIAGNOSIS — S29019A Strain of muscle and tendon of unspecified wall of thorax, initial encounter: Secondary | ICD-10-CM | POA: Insufficient documentation

## 2018-04-17 MED FILL — CONTOUR NEXT STRIPS: 30 days supply | Qty: 300 | Fill #0

## 2018-04-25 ENCOUNTER — Encounter: Payer: Self-pay | Admitting: Sports Medicine

## 2018-04-25 ENCOUNTER — Ambulatory Visit: Payer: 59 | Admitting: Sports Medicine

## 2018-04-25 ENCOUNTER — Ambulatory Visit (INDEPENDENT_AMBULATORY_CARE_PROVIDER_SITE_OTHER): Payer: 59

## 2018-04-25 DIAGNOSIS — R0789 Other chest pain: Secondary | ICD-10-CM

## 2018-04-25 DIAGNOSIS — R079 Chest pain, unspecified: Secondary | ICD-10-CM

## 2018-04-25 LAB — CK TOTAL AND CKMB (NOT AT ARMC)
CK, MB: 1.1 ng/mL (ref 0–5.0)
Relative Index: 1.5 (ref 0–4.0)
Total CK: 75 U/L (ref 29–143)

## 2018-04-25 LAB — POCT URINALYSIS DIPSTICK
Bilirubin, UA: NEGATIVE
Blood, UA: NEGATIVE
Glucose, UA: NEGATIVE
Ketones, UA: NEGATIVE
Leukocytes, UA: NEGATIVE
Nitrite, UA: NEGATIVE
Protein, UA: NEGATIVE
Spec Grav, UA: 1.01 (ref 1.010–1.025)
Urobilinogen, UA: 0.2 U/dL
pH, UA: 7 (ref 5.0–8.0)

## 2018-04-25 LAB — TROPONIN I: Troponin I: <0.01 ng/mL (ref <=0.0)

## 2018-04-25 MED ORDER — GI COCKTAIL ~~LOC~~
30.0000 mL | Freq: Once | ORAL | Status: AC
  Administered 2018-04-25: 30 mL via ORAL

## 2018-04-25 NOTE — Progress Notes (Addendum)
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  Pain has been somewhat on the left side of the chest, constant, not associated with exertion, deep breathing, eating.  No melena, hematochezia, hematemesis, no fevers, chills, shortness of breath, cough.  Blood sugars have been elevated over the past couple of weeks.  Mild urinary urgency.  No headaches, visual changes, no palpitations.  I reviewed the past medical history, family history, social history, surgical history, and allergies today and no changes were needed.  Please see the problem list section below in epic for further details.  Past Medical History: Past Medical History:  Diagnosis Date  . ACUT MI ANTEROLAT WALL SUBSQT EPIS CARE   . Acute maxillary sinusitis   . ALLERGIC RHINITIS, SEASONAL   . ARTHRITIS, RHEUMATOID   . Atrial fibrillation ([place])    a. after CABG.  . CAD, ARTERY BYPASS GRAFT    a. DES to RCA in 2010 then LAD occlusion s/p CABG 3 06/07/2009 with LIMA to LAD, reverse SVG to D1, reverse SVG to distal RCA. b. Cath 05/08/2016 slightly hypodense region in the intermediate branch, however she had excellent flow, FFR was normal. Vein graft to PDA and the posterolateral branch is patent, patent LIMA to LAD, occluded SVG to diagonal.  . CARPAL TUNNEL SYNDROME, BILATERAL   . CHOLELITHIASIS   . Contrast media allergy   . DERMATITIS, ALLERGIC   . DIABETES MELLITUS, TYPE I    on insulin pump  . DIABETIC  RETINOPATHY   . Hiatal hernia   . HYPERLIPIDEMIA-MIXED   . HYPERTHYROIDISM   . Insulin pump in place   . MIGRAINE W/O AURA W/INTRACT W/STATUS MIGRAINOSUS 02/19/2008  . SINUS TACHYCARDIA  11/08/2010  . TRIGGER FINGER   . URI    Past Surgical History: Past Surgical History:  Procedure Laterality Date  . ABDOMINAL HYSTERECTOMY    . Caesarean section    . CARDIAC CATHETERIZATION N/A 05/08/2016   Procedure: Left Heart Cath and Cors/Grafts Angiography;  Surgeon: Burnell Blanks, MD;  Location: [place] [facility];  Service: Cardiovascular;  Laterality: N/A;  . CARPAL TUNNEL RELEASE    . CHOLECYSTECTOMY    . CORONARY ARTERY BYPASS GRAFT    . VITRECTOMY     Social History: Social History   Socioeconomic History  . Marital status: Legally Separated    Spouse name: Not on file  . Number of children: Not on file  . Years of education: Not on file  . Highest education level: Not on file  Occupational History  . Not on file  Social Needs  . Financial resource strain: Not on file  . Food insecurity:    Worry: Not on file    Inability: Not on file  . Transportation needs:    Medical: Not on file    Non-medical: Not on file  Tobacco Use  . Smoking status: Never Smoker  . Smokeless tobacco: Never Used  Substance and Sexual Activity  . Alcohol use: Yes    Comment: rarely 1 every 6 months  . Drug use: No  . Sexual activity: Not Currently    Partners: Male    Birth control/protection: None  Lifestyle  . Physical activity:  Days per week: Not on file    Minutes per session: Not on file  . Stress: Not on file  Relationships  . Social connections:    Talks on phone: Not on file    Gets together: Not on file    Attends religious service: Not on file    Active member of club or organization: Not on file    Attends meetings of clubs or organizations: Not on file    Relationship status: Not on file  Other Topics Concern  . Not on file  Social History Narrative   Divorced. Has 2 kids(fraternal twins) daughters age 56. Works at [employer], Never smoked, denies ETOH, no drugs. Drinks diet coke. No exercise.       Family History: Family History    Problem Relation Age of Onset  . Colon polyps Father   . Diabetes Unknown   . Heart disease Unknown   . Hypertension Unknown   . Hyperlipidemia Unknown   . Depression Unknown   . Migraines Unknown   . Stroke Paternal Grandmother   . Heart attack Neg Hx   . Colon cancer Neg Hx   . Stomach cancer Neg Hx   . Esophageal cancer Neg Hx    Allergies: Allergies  Allergen Reactions  . Prochlorperazine Rash    Neuro problems per pt Neurological reaction  . Ramipril Swelling, Rash and Other (See Comments)  . Shellfish-Derived Products Swelling    Shrimp(Facial swelling) Shrimp(Facial swelling)  . Atorvastatin Rash    Elevated LFT's Elevated LFT's Elevated LFT's  . Compazine  [Prochlorperazine Edisylate] Other (See Comments)    Neurological reaction  . Etanercept Swelling and Rash  . Infliximab Rash  . Iohexol     Iv contrast dye -rash all over  . Orencia [Abatacept] Rash  . Shellfish Allergy Swelling    Shrimp(Facial swelling)  . Tofacitinib Rash and Other (See Comments)    Severe abdominal pain Severe abdominal pain Severe abdominal pain  . Prochlorperazine Edisylate     unknown  . Amiodarone Nausea Only  . Rituximab Rash    Causes a rash   Medications: See med rec.  Review of Systems: No fevers, chills, night sweats, weight loss, chest pain, or shortness of breath.   Objective:    General: Well Developed, well nourished, and in no acute distress.  Neuro: Alert and oriented x3, extra-ocular muscles intact, sensation grossly intact.  HEENT: Normocephalic, atraumatic, pupils equal round reactive to light, neck supple, no masses, no lymphadenopathy, thyroid nonpalpable.  Skin: Warm and dry, no rashes. Cardiac: Regular rate and rhythm, no murmurs rubs or gallops, no lower extremity edema.  I am able to reproduce some of the chest pain with pressure of the stethoscope over her left costal margin. Respiratory: Clear to auscultation bilaterally. Not using accessory muscles,  speaking in full sentences. Abdomen: Soft, nontender, nondistended normal bowel sounds, no palpable masses, no guarding, rigidity, rebound tenderness.  No costovertebral angle pain.  Urinalysis is completely negative, no blood, pyuria, nitrites.  Impression and Recommendations:    CHEST PAIN GI cocktail given. History of CABG, recent catheterization in 2017 with multi-vessel disease, it was suspected that these were not a cause of her chest pain. Right now her pain is anterior, reproducible on palpation of the left costal cartilage. It is not worsened with exertion, better with rest, no radiation to the arm, neck, no nausea, diaphoresis, palpitations. I am going to check some labs, CBC, CMP, amylase, lipase, chest x-ray. D-dimer, cardiac  enzymes. Keep close follow-up.  Chest x-ray shows some pulmonary venous congestion, adding a BNP. ___________________________________________ Gwen Her. Dianah Field, M.D., ABFM., CAQSM. Primary Care and [place] Instructor of [place] [facility]

## 2018-04-25 NOTE — Addendum Note (Signed)
Addended by: Silverio Decamp on: 04/25/2018 04:00 PM   Modules accepted: Orders

## 2018-04-25 NOTE — Assessment & Plan Note (Addendum)
GI cocktail given. History of CABG, recent catheterization in 2017 with multi-vessel disease, it was suspected that these were not a cause of her chest pain. Right now her pain is anterior, reproducible on palpation of the left costal cartilage. It is not worsened with exertion, better with rest, no radiation to the arm, neck, no nausea, diaphoresis, palpitations. I am going to check some labs, CBC, CMP, amylase, lipase, chest x-ray. D-dimer, cardiac enzymes. Keep close follow-up.  Chest x-ray shows some pulmonary venous congestion, adding a BNP.

## 2018-04-26 LAB — CBC WITH DIFFERENTIAL/PLATELET
Basophils Absolute: 39 cells/uL (ref 0–200)
Basophils Relative: 0.8 %
Eosinophils Absolute: 289 {cells}/uL (ref 15–500)
Eosinophils Relative: 5.9 %
HCT: 35.7 % (ref 35.0–45.0)
Hemoglobin: 12.6 g/dL (ref 11.7–15.5)
Lymphs Abs: 1887 cells/uL (ref 850–3900)
MCH: 29.5 pg (ref 27.0–33.0)
MCHC: 35.3 g/dL (ref 32.0–36.0)
MCV: 83.6 fL (ref 80.0–100.0)
MPV: 10 fL (ref 7.5–12.5)
Monocytes Relative: 9.7 %
Neutro Abs: 2210 {cells}/uL (ref 1500–7800)
Neutrophils Relative %: 45.1 %
Platelets: 205 10*3/uL (ref 140–400)
RBC: 4.27 Million/uL (ref 3.80–5.10)
RDW: 12.3 % (ref 11.0–15.0)
Total Lymphocyte: 38.5 %
WBC mixed population: 475 {cells}/uL (ref 200–950)
WBC: 4.9 Thousand/uL (ref 3.8–10.8)

## 2018-04-26 LAB — COMPREHENSIVE METABOLIC PANEL
AG Ratio: 1.9 (calc) (ref 1.0–2.5)
Albumin: 4 g/dL (ref 3.6–5.1)
Alkaline phosphatase (APISO): 63 U/L (ref 33–130)
CO2: 26 mmol/L (ref 20–32)
Chloride: 106 mmol/L (ref 98–110)
Creat: 0.9 mg/dL (ref 0.50–1.05)
Globulin: 2.1 g/dL (calc) (ref 1.9–3.7)
Glucose, Bld: 131 mg/dL — ABNORMAL HIGH (ref 65–99)
Potassium: 4.1 mmol/L (ref 3.5–5.3)
Sodium: 143 mmol/L (ref 135–146)
Total Protein: 6.1 g/dL (ref 6.1–8.1)

## 2018-04-26 LAB — COMPREHENSIVE METABOLIC PANEL WITH GFR
ALT: 16 U/L (ref 6–29)
AST: 18 U/L (ref 10–35)
BUN: 7 mg/dL (ref 7–25)
Calcium: 9.3 mg/dL (ref 8.6–10.4)
Total Bilirubin: 0.4 mg/dL (ref 0.2–1.2)

## 2018-04-26 LAB — LIPASE: Lipase: 7 U/L (ref 7–60)

## 2018-04-26 LAB — D-DIMER, QUANTITATIVE: D-Dimer, Quant: 0.23 ug{FEU}/mL (ref <0.50)

## 2018-04-26 LAB — AMYLASE: Amylase: 36 U/L (ref 21–101)

## 2018-04-30 ENCOUNTER — Telehealth: Payer: Self-pay | Admitting: Sports Medicine

## 2018-04-30 NOTE — Telephone Encounter (Signed)
Apolonio Schneiders had added the test online and we received a fax that the patient would have to come back to be recollected since the correct tube was not collected. Does the patient need to come back? Insurance may not cover this test. Please advise.

## 2018-05-01 NOTE — Telephone Encounter (Signed)
No, I'll cancel it.

## 2018-05-05 ENCOUNTER — Ambulatory Visit (INDEPENDENT_AMBULATORY_CARE_PROVIDER_SITE_OTHER): Payer: 59 | Admitting: Physician Assistant

## 2018-05-05 ENCOUNTER — Encounter: Payer: Self-pay | Admitting: Physician Assistant

## 2018-05-05 VITALS — BP 130/85 | HR 68 | Ht 63.0 in | Wt 177.0 lb

## 2018-05-05 DIAGNOSIS — I10 Essential (primary) hypertension: Secondary | ICD-10-CM | POA: Diagnosis not present

## 2018-05-05 DIAGNOSIS — E119 Type 2 diabetes mellitus without complications: Secondary | ICD-10-CM | POA: Diagnosis not present

## 2018-05-05 DIAGNOSIS — Z794 Long term (current) use of insulin: Secondary | ICD-10-CM | POA: Diagnosis not present

## 2018-05-05 DIAGNOSIS — I25119 Atherosclerotic heart disease of native coronary artery with unspecified angina pectoris: Secondary | ICD-10-CM | POA: Diagnosis not present

## 2018-05-05 DIAGNOSIS — R072 Precordial pain: Secondary | ICD-10-CM

## 2018-05-05 DIAGNOSIS — IMO0001 Reserved for inherently not codable concepts without codable children: Secondary | ICD-10-CM

## 2018-05-05 NOTE — Progress Notes (Signed)
Subjective:    Patient ID: [name], female    DOB: 1966/11/06, 52 y.o.   MRN: [id number]  HPI  Pt is a 52 yo female with hx of atrial fibrillation, CAD, MI, HTN, DM, hyperlipidemia who presents to the clinic for follow up on CP.  She was seen on 04/25/18 for CP labs normal, cardiac enzymes normal, CXR showed pulmonary congestion but BNP normal. Thought could be more GI related. GI cocktail did not help and may have made worse.   Has cardiologist Dr. Sherren Mocha.   CP better but continues to have episodes at rest and exertion.   .. Active Ambulatory Problems    Diagnosis Date Noted  . HYPERTHYROIDISM 11/09/2010  . DIABETIC  RETINOPATHY 04/25/2009  . HYPERLIPIDEMIA-MIXED 04/26/2009  . MIGRAINE W/O AURA W/INTRACT W/STATUS MIGRAINOSUS 02/19/2008  . CARPAL TUNNEL SYNDROME, BILATERAL 04/25/2009  . ACUT MI ANTEROLAT WALL SUBSQT EPIS CARE 04/26/2009  . ANGINA, STABLE/EXERTIONAL 06/02/2009  . CAD, ARTERY BYPASS GRAFT 06/07/2009  . ATRIAL FIBRILLATION 07/20/2009  . SINUS TACHYCARDIA 11/08/2010  . ALLERGIC RHINITIS, SEASONAL 10/16/2006  . CHOLELITHIASIS 10/16/2006  . DERMATITIS, ALLERGIC 07/20/2010  . Rheumatoid arthritis ([place]) 10/16/2006  . TRIGGER FINGER 04/25/2009  . DIZZINESS 11/14/2009  . EDEMA 07/12/2010  . SHORTNESS OF BREATH 11/08/2010  . CHEST PAIN 04/26/2009  . Hypertension, essential 05/08/2016  . IDDM (insulin dependent diabetes mellitus) ([place]) 05/08/2016  . Coronary artery disease involving native coronary artery of native heart with angina pectoris ([place])   . Graves disease 02/01/2017  . Sleep difficulties 02/01/2017  . No energy 02/01/2017  . Osteopenia 02/01/2017  . Sinus pressure 03/15/2017  . Nasal congestion 03/15/2017  . Abnormal CT scan 06/19/2017  . Gastroesophageal reflux disease 06/19/2017  . Antiplatelet or antithrombotic long-term use 06/19/2017  . Vitamin D deficiency 07/04/2017  . B12 deficiency 07/04/2017   Resolved Ambulatory Problems   Diagnosis Date Noted  . DIABETES MELLITUS, TYPE I 10/16/2006  . COMMON MIGRAINE 01/24/2007  . ESSENTIAL HYPERTENSION 10/16/2006  . Acute maxillary sinusitis 10/16/2010  . URI 01/24/2007  . Diarrhea 11/08/2010  . Chest pain syndrome 05/08/2016  . Hyperlipidemia, mixed 05/08/2016  . Headache around the eyes 03/15/2017  . Cecum mass 05/22/2017  . RLQ abdominal pain 06/19/2017   Past Medical History:  Diagnosis Date  . ACUT MI ANTEROLAT WALL SUBSQT EPIS CARE   . Acute maxillary sinusitis   . ALLERGIC RHINITIS, SEASONAL   . ARTHRITIS, RHEUMATOID   . Atrial fibrillation ([place])   . CAD, ARTERY BYPASS GRAFT   . CARPAL TUNNEL SYNDROME, BILATERAL   . CHOLELITHIASIS   . Contrast media allergy   . DERMATITIS, ALLERGIC   . DIABETES MELLITUS, TYPE I   . DIABETIC  RETINOPATHY   . Hiatal hernia   . HYPERLIPIDEMIA-MIXED   . HYPERTHYROIDISM   . Insulin pump in place   . MIGRAINE W/O AURA W/INTRACT W/STATUS MIGRAINOSUS 02/19/2008  . SINUS TACHYCARDIA 11/08/2010  . TRIGGER FINGER   . URI     Review of Systems     Objective:   Physical Exam  Constitutional: She is oriented to person, place, and time. She appears well-developed and well-nourished.  HENT:  Head: Normocephalic and atraumatic.  Neck: Normal range of motion. Neck supple.  Cardiovascular: Normal rate, regular rhythm and normal heart sounds.  Pulmonary/Chest: Effort normal and breath sounds normal. She exhibits no tenderness.  Neurological: She is alert and oriented to person, place, and time.  Skin: Skin is warm and dry.  Psychiatric: She has a normal mood and affect. Her behavior is normal.          Assessment & Plan:  Marland KitchenMarland KitchenDiagnoses and all orders for this visit:  Precordial pain  Coronary artery disease involving native coronary artery of native heart with angina pectoris (HCC)  Hypertension, essential  IDDM (insulin dependent diabetes mellitus) ([place])   Pt is stable today.BP good. Treating GERD does not seem to  be helping CP. With significant hx follow up with cardiologist. May need cardiac imaging due to hx. Call with any sudden changes.

## 2018-05-07 ENCOUNTER — Telehealth: Payer: Self-pay | Admitting: Rheumatology

## 2018-05-07 ENCOUNTER — Encounter: Payer: Self-pay | Admitting: Physician Assistant

## 2018-05-07 ENCOUNTER — Other Ambulatory Visit: Payer: Self-pay | Admitting: Pharmacist

## 2018-05-07 MED ORDER — GOLIMUMAB 100 MG/ML ~~LOC~~ SOAJ: SUBCUTANEOUS | 0 refills | Status: DC

## 2018-05-07 MED FILL — SIMPONI 100 MG/ML SOAJ: 100 | 30 days supply | Qty: 1 | Fill #0

## 2018-05-07 NOTE — Telephone Encounter (Signed)
Pharmacy request refill on Simponi 100mg / ml pen.

## 2018-05-07 NOTE — Telephone Encounter (Signed)
Last visit: 01/02/2018 Next visit: 06/23/2018 Labs: 04/25/18 elevated glucose.  TB Gold: 08/02/17 Neg   Okay to refill per Dr. Estanislado Pandy

## 2018-05-08 MED FILL — CLOPIDOGREL 75 MG TABLET: 75 | 90 days supply | Qty: 90 | Fill #3

## 2018-05-08 MED FILL — MONTELUKAST SOD 10 MG TAB: 10 | 30 days supply | Qty: 30 | Fill #5

## 2018-05-08 MED FILL — raNITIdine HCL 300 MG TABS: 300 | 30 days supply | Qty: 30 | Fill #6

## 2018-05-08 MED FILL — ROSUVASTATIN CALCIUM 10 MG: 10 | 90 days supply | Qty: 90 | Fill #3

## 2018-05-14 ENCOUNTER — Telehealth: Payer: Self-pay | Admitting: Nurse Practitioner

## 2018-05-14 NOTE — Telephone Encounter (Signed)
   [facility] Pre-operative Risk Assessment    Request for surgical clearance:  1. What type of surgery is being performed? PRP treatment (Platelet Rich Plasma)   2. When is this surgery scheduled? No date indicated   3. What type of clearance is required (medical clearance vs. Pharmacy clearance to hold med vs. Both)? Medical to hold Plavix  4. Are there any medications that need to be held prior to surgery and how long? Clearance for stopping Plavix for PRPp treatment   5. Practice name and name of physician performing surgery? Dr. Neomia Dear, [facility]   6. What is your office phone number [phone]    7.   What is your office fax number [phone]   8.   Anesthesia type (None, local, MAC, general) ? None listed   Emmaline Life 05/14/2018, 2:42 PM  _________________________________________________________________   (provider comments below)

## 2018-05-15 NOTE — Telephone Encounter (Signed)
Routing to Dr. Burt Knack to address Plavix hold.

## 2018-05-18 NOTE — Telephone Encounter (Signed)
Ok to hold plavix for the procedure as requested. thanks

## 2018-05-19 NOTE — Telephone Encounter (Signed)
Pt called. Having chest pressure. Had an appointment scheduled on 6/12, changed to 5/29 at 4:00. Will address pre-procedure clearance at this appt.  Will remove from preop pool.

## 2018-05-19 NOTE — Telephone Encounter (Signed)
Call placed to check on pt condition since last appointment in 06/2017. VM left for pt to return call to preop clinic between 1:30 and 5 pm.

## 2018-05-28 ENCOUNTER — Encounter: Payer: Self-pay | Admitting: Cardiology

## 2018-05-28 ENCOUNTER — Ambulatory Visit: Payer: 59 | Admitting: Cardiology

## 2018-05-28 ENCOUNTER — Ambulatory Visit: Payer: Self-pay | Admitting: Cardiology

## 2018-05-28 VITALS — BP 122/80 | HR 79 | Ht 63.0 in | Wt 177.8 lb

## 2018-05-28 DIAGNOSIS — I25119 Atherosclerotic heart disease of native coronary artery with unspecified angina pectoris: Secondary | ICD-10-CM | POA: Diagnosis not present

## 2018-05-28 DIAGNOSIS — R079 Chest pain, unspecified: Secondary | ICD-10-CM

## 2018-05-28 DIAGNOSIS — Z794 Long term (current) use of insulin: Secondary | ICD-10-CM

## 2018-05-28 DIAGNOSIS — IMO0001 Reserved for inherently not codable concepts without codable children: Secondary | ICD-10-CM

## 2018-05-28 DIAGNOSIS — I10 Essential (primary) hypertension: Secondary | ICD-10-CM

## 2018-05-28 DIAGNOSIS — E119 Type 2 diabetes mellitus without complications: Secondary | ICD-10-CM | POA: Diagnosis not present

## 2018-05-28 DIAGNOSIS — E785 Hyperlipidemia, unspecified: Secondary | ICD-10-CM

## 2018-05-28 MED ORDER — ISOSORBIDE MONONITRATE ER 60 MG PO TB24: 60.0000 mg | ORAL_TABLET | Freq: Every day | ORAL | 11 refills | Status: DC

## 2018-05-28 MED FILL — ISOSORBIDE MN ER 60 MG TAB: 60 | 30 days supply | Qty: 30 | Fill #0

## 2018-05-28 NOTE — Patient Instructions (Signed)
Medication Instructions:  Your physician has recommended you make the following change in your medication:  INCREASE: Imdur to 60 mg once a day   If you need a refill on your cardiac medications, please contact your pharmacy first.  Labwork: None ordered   Testing/Procedures: Your physician has requested that you have en exercise stress myoview. For further information please visit [email or web address]. Please follow instruction sheet, as given.  Follow-Up: Your physician wants you to follow-up in: 6 months with Dr. Burt Knack. You will receive a reminder letter in the mail two months in advance. If you don't receive a letter, please call our office to schedule the follow-up appointment.  Any Other Special Instructions Will Be Listed Below (If Applicable).   Thank you for choosing [facility]    [phone]  If you need a refill on your cardiac medications before your next appointment, please call your pharmacy.

## 2018-05-28 NOTE — Progress Notes (Signed)
Cardiology Office Note:    Date:  05/28/2018   ID:  [name] 24-Oct-1966, MRN [id number]  PCP:  Donella Stade, PA-C  Cardiologist:  Sherren Mocha, MD  Referring MD: Donella Stade, [place]   Chief Complaint  Patient presents with  . Chest Pain    History of Present Illness:    [name] is a 52 y.o. female with a past medical history significant for premature CAD requiring CABG in 2010 after inferior wall MI and stent to RCA. CABG was done due to totally occluded LAD. She has had no further ischemic events. She did have a cardiac cath in 04/2016 showed patent LIMA to to LAD, occluded vein graft to diagonal, patent graft to PDA and posterolateral branches. The RCA stent was occluded. There was no target for intervention and her chest pain was not felt to be cardiac in nature. She also has history of GERD, HTN, HLD.   [name] was last seen in the office by Dr. Burt Knack on 07/11/17 at which time she was doing well.   She is planned for PRP (platelet rich plasma) injections in the back and we have been asked for clearance. Due to her complaints of chest pain she was brought in for further evaluation.   Today [name] is her alone for pre-procedure evaluation.  She has been having chest pain since about March. It is intermittent left sided, localized chest pressure lasting for a few minutes to a couple of hours. The intensity is no worse than 5/10. At one point it lasted all day and all night. It does not radiate and is not related to activity, can occur while seated at rest. Not worse after eating. Deep breathing sometimes exacerbates the pain. There is no associated shortness of breath, palpitations or lightheadedness. She does have occasional nausea with it but not every time. This occurs 3-4 times per week and has been getting more frequent since March, but no change in intensity. She has no orthopnea or PND. She has mild lower leg edema at the end of a work day that resolves  over night. She has lasix to use as needed but rarely takes it, about twice in the last year.   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 She has no exertional chest discomfort or dyspnea. She's on light duty with lifting of less than 10 lbs due to back injury at work in October. She does not do formal exercise since her back injury. She feels that she has been gaining wt since her activity level has decreased due to back pain.   Past Medical History:  Diagnosis Date  . ACUT MI ANTEROLAT WALL SUBSQT EPIS CARE   . Acute maxillary sinusitis   . ALLERGIC RHINITIS, SEASONAL   . ARTHRITIS, RHEUMATOID   . Atrial fibrillation ([place])    a. after CABG.  . CAD, ARTERY BYPASS GRAFT    a. DES to RCA in 2010 then LAD occlusion s/p CABG 3 06/07/2009 with LIMA to LAD, reverse SVG to D1, reverse SVG to distal RCA. b. Cath 05/08/2016 slightly hypodense region in the intermediate branch, however she had excellent flow, FFR was normal. Vein graft to PDA and the posterolateral branch is patent, patent LIMA to LAD, occluded SVG to diagonal.  . CARPAL TUNNEL SYNDROME, BILATERAL   . CHOLELITHIASIS   . Contrast media allergy   . DERMATITIS, ALLERGIC   . DIABETES MELLITUS, TYPE I  on insulin pump  . DIABETIC  RETINOPATHY   . Hiatal hernia   . HYPERLIPIDEMIA-MIXED   . HYPERTHYROIDISM   . Insulin pump in place   . MIGRAINE W/O AURA W/INTRACT W/STATUS MIGRAINOSUS 02/19/2008  . SINUS TACHYCARDIA 11/08/2010  . TRIGGER FINGER   . URI     Past Surgical History:  Procedure Laterality Date  . ABDOMINAL HYSTERECTOMY    . Caesarean section    . CARDIAC CATHETERIZATION N/A 05/08/2016   Procedure: Left Heart Cath and Cors/Grafts Angiography;  Surgeon: Burnell Blanks, MD;  Location: [facility];  Service: Cardiovascular;  Laterality: N/A;  . CARPAL TUNNEL RELEASE    . CHOLECYSTECTOMY    . CORONARY ARTERY BYPASS GRAFT    . VITRECTOMY      Current Medications: Current Meds    Medication Sig  . aspirin 81 MG tablet Take 81 mg by mouth daily.   . clopidogrel (PLAVIX) 75 MG tablet Take 1 tablet (75 mg total) by mouth daily.  . cyclobenzaprine (FLEXERIL) 10 MG tablet TAKE 1 TABLET (10 MG TOTAL) BY MOUTH AT BEDTIME.  Marland Kitchen EPINEPHrine 0.15 MG/0.15ML IJ injection Inject 0.15 mLs (0.15 mg total) into the skin as needed. Inject as directed.  . folic acid (FOLVITE) 1 MG tablet TAKE TWO TABLETS BY MOUTH DAILY  . furosemide (LASIX) 40 MG tablet Take 40 mg by mouth daily as needed for edema (When she feels like she is swelling).  Marland Kitchen glycopyrrolate (ROBINUL) 2 MG tablet Take 1 tablet (2 mg total) by mouth 2 (two) times daily. (Patient taking differently: Take 2 mg by mouth 2 (two) times daily as needed. )  . Golimumab (SIMPONI) 100 MG/ML SOAJ INJECT ONE DEVICE UNDER THE SKIN ONCE MONTHLY  . hydroxychloroquine (PLAQUENIL) 200 MG tablet TAKE 1 TABLET BY MOUTH EVERY MORNING AND 1/2 TABLET EVERY EVENING  . insulin lispro (HUMALOG) 100 UNIT/ML injection FOR USE IN INSULIN PUMP. TOTAL DAILY INSULIN DOSE = UP TO 90 UNITS.  Marland Kitchen leflunomide (ARAVA) 20 MG tablet TAKE 1 TABLET BY MOUTH DAILY  . losartan (COZAAR) 50 MG tablet Take 1 tablet (50 mg total) by mouth daily.  . metoprolol tartrate (LOPRESSOR) 25 MG tablet Take 1 tablet (25 mg total) by mouth 2 (two) times daily.  . montelukast (SINGULAIR) 10 MG tablet TAKE 1 TABLET BY MOUTH AT BEDTIME  . nabumetone (RELAFEN) 750 MG tablet TAKE 1 TABLET BY MOUTH TWICE DAILY  . Omega-3 Fatty Acids (FISH OIL) 1200 MG CAPS Take 1,200 mg by mouth 2 (two) times daily.  . ONE TOUCH ULTRA TEST test strip   . pantoprazole (PROTONIX) 40 MG tablet Take 1 tablet (40 mg total) by mouth 2 (two) times daily.  . potassium chloride (K-DUR) 10 MEQ tablet Take 1 tablet (10 mEq total) by mouth daily.  . ranitidine (ZANTAC) 300 MG capsule Take 1 capsule (300 mg total) by mouth every evening.  . rosuvastatin (CRESTOR) 10 MG tablet Take 1 tablet (10 mg total) by mouth daily.   . [DISCONTINUED] isosorbide mononitrate (IMDUR) 30 MG 24 hr tablet Take 1 tablet (30 mg total) by mouth daily.   Current Facility-Administered Medications for the 05/28/18 encounter (Office Visit) with Daune Perch, NP  Medication  . 0.9 %  sodium chloride infusion     Allergies:   Prochlorperazine; Ramipril; Shellfish-derived products; Atorvastatin; Compazine  [prochlorperazine edisylate]; Etanercept; Infliximab; Iohexol; Orencia [abatacept]; Shellfish allergy; Tofacitinib; Prochlorperazine edisylate; Amiodarone; and Rituximab   Social History   Socioeconomic History  . Marital status:  Legally Separated    Spouse name: Not on file  . Number of children: Not on file  . Years of education: Not on file  . Highest education level: Not on file  Occupational History  . Not on file  Social Needs  . Financial resource strain: Not on file  . Food insecurity:    Worry: Not on file    Inability: Not on file  . Transportation needs:    Medical: Not on file    Non-medical: Not on file  Tobacco Use  . Smoking status: Never Smoker  . Smokeless tobacco: Never Used  Substance and Sexual Activity  . Alcohol use: Yes    Comment: rarely 1 every 6 months  . Drug use: No  . Sexual activity: Not Currently    Partners: Male    Birth control/protection: None  Lifestyle  . Physical activity:    Days per week: Not on file    Minutes per session: Not on file  . Stress: Not on file  Relationships  . Social connections:    Talks on phone: Not on file    Gets together: Not on file    Attends religious service: Not on file    Active member of club or organization: Not on file    Attends meetings of clubs or organizations: Not on file    Relationship status: Not on file  Other Topics Concern  . Not on file  Social History Narrative   Divorced. Has 2 kids(fraternal twins) daughters age 37. Works at [employer], Never smoked, denies ETOH, no drugs. Drinks diet coke. No exercise.          Family History: The patient's family history includes Colon polyps in her father; Depression in her unknown relative; Diabetes in her unknown relative; Heart disease in her unknown relative; Hyperlipidemia in her unknown relative; Hypertension in her unknown relative; Migraines in her unknown relative; Stroke in her paternal grandmother. There is no history of Heart attack, Colon cancer, Stomach cancer, or Esophageal cancer. ROS:   Please see the history of present illness.     All other systems reviewed and are negative.  EKGs/Labs/Other Studies Reviewed:    The following studies were reviewed today:  Cardiac Cath 05/08/2016: Conclusion   1. Severe double vessel CAD s/p 3V CABG 2. The LAD has diffuse severe proximal stenosis. There is both antegrade flow down the LAD and retrograde filling from the patent LIMA graft. The Diagonal that was supplied by the vein graft is small in caliber. The vein graft to the diagonal is occluded.  3. The Circumflex is small in caliber and has mild plaque disease. The intermediate branch is large in caliber. The intermediate branch has a proximal 30% stenosis, slightly hypodense in appearance but with excellent flow down the vessel. (FFR of this vessel is 0.91 with infusion of adenosine suggesting no significant flow obstruction). It is unclear if this represents a non-flow limiting plaque rupture or hypodense calcific lesion.  4. The RCA is previously stented in the mid segment. The mid stented segment is completely occluded. The vein graft to the PDA and posterolateral branches is patent.   Recommendations: She presented with continuous chest pain for 48 hours with no objective evidence of ischemia (negative troponin, no ischemic EKG changes). No culprit lesion is noted. As above, the mildly hypodense plaque in the intermediate branch could represent plaque rupture without flow obstruction but FFR is in a normal range. I do not think this lesion  is causing  her chest pain given the excellent flow down the vessel. I will treat with ASA, Plavix in case of plaque rupture and will start Imdur. Consider GI related cause of her chest pain.    Diagnostic Diagram      Echocardiogram 06/10/2014 Study Conclusions - Left ventricle: The cavity size was normal. Wall thickness was normal. Systolic function was vigorous. The estimated ejection fraction was in the range of 65% to 70%. - Aortic valve: There was trivial regurgitation.  EKG:  EKG is ordered today.  The ekg ordered today demonstrates NSR, low voltage, no changes from previous.   Recent Labs: 04/25/2018: ALT 16; BUN 7; Creat 0.90; Hemoglobin 12.6; Platelets 205; Potassium 4.1; Sodium 143   Recent Lipid Panel    Component Value Date/Time   CHOL 151 05/08/2016 0412   TRIG 101 05/08/2016 0412   HDL 69 05/08/2016 0412   CHOLHDL 2.2 05/08/2016 0412   VLDL 20 05/08/2016 0412   LDLCALC 62 05/08/2016 0412    Physical Exam:    VS:  BP 122/80   Pulse 79   Ht 5\' 3"  (1.6 m)   Wt 177 lb 12.8 oz (80.6 kg)   BMI 31.50 kg/m     Wt Readings from Last 3 Encounters:  05/28/18 177 lb 12.8 oz (80.6 kg)  05/05/18 177 lb (80.3 kg)  04/25/18 176 lb (79.8 kg)     Physical Exam  Constitutional: She is oriented to person, place, and time. She appears well-developed and well-nourished. No distress.  HENT:  Head: Normocephalic and atraumatic.  Neck: Normal range of motion. Neck supple. No JVD present.  Cardiovascular: Normal rate, regular rhythm, normal heart sounds and intact distal pulses. Exam reveals no gallop and no friction rub.  No murmur heard. Pulmonary/Chest: Effort normal and breath sounds normal. No respiratory distress. She has no wheezes. She has no rales.  Abdominal: Soft. Bowel sounds are normal.  Musculoskeletal: Normal range of motion. She exhibits no edema or deformity.  Neurological: She is alert and oriented to person, place, and time.  Skin: Skin is warm and dry.    Psychiatric: She has a normal mood and affect. Her behavior is normal. Thought content normal.  Vitals reviewed.    ASSESSMENT:    1. Chest pain, unspecified type   2. Coronary artery disease involving native coronary artery of native heart with angina pectoris ([place])   3. Hypertension, essential   4. Hyperlipidemia LDL goal <70   5. IDDM (insulin dependent diabetes mellitus) (HCC)    PLAN:    In order of problems listed above:  Chest pain: The patient's symptoms are mostly atypical with no changes on EKG, no associated symptoms and non exertional, but considering her history of premature CAD with CABG as well as diabetes, will check an exercise myoview. If it is high risk she will need a cardiac cath. If it is low risk she can proceed with the planned back injection. Will increase her Imdur and she will monitor response.   Per Dr. Burt Knack, her plavix can be held for the requested amount of time for the procedure. Resume when safe after the procedure.   CAD: Hx of CABG in 2010. LHC in 04/2016 showed patent LIMA to to LAD, occluded vein graft to diagonal, patent graft to PDA and posterolateral branches. The RCA stent was occluded. There was no target for intervention and her chest pain was felt to be non-cardiac in nature. She is continued on Plavix, aspirin, BB, ARB, statin.  Will increase Imdur in case her chest pain is cardiac in origin.   Hypertension: On losartan Imdur, lopressor with good BP control. Continue current meds.   Hyperlipidemia: followed by endocrinology. On Crestor 10 mg. LDL goal <70. LDL 74 on 10/22/17. Conitnue current therapy.   Insulin dependent diabetes: on insulin pump. Followed closely by endocrinology. A1c 6.9 10/22/17  GERD: she is having burning in the throat lately. She takes protonix 40 mg q am and ranitidine 150 mg at hs. Her current chest pain is not related to meals, but GERD could be the source of her discomfort. We reviewed measures to prevent reflux  including avoiding large meals and eating close to bedtime. Advised to eat frequent, small meals and avoid offending foods.    Medication Adjustments/Labs and Tests Ordered: Current medicines are reviewed at length with the patient today.  Concerns regarding medicines are outlined above. Labs and tests ordered and medication changes are outlined in the patient instructions below:  Patient Instructions  Medication Instructions:  Your physician has recommended you make the following change in your medication:  INCREASE: Imdur to 60 mg once a day   If you need a refill on your cardiac medications, please contact your pharmacy first.  Labwork: None ordered   Testing/Procedures: Your physician has requested that you have en exercise stress myoview. For further information please visit [email or web address]. Please follow instruction sheet, as given.  Follow-Up: Your physician wants you to follow-up in: 6 months with Dr. Burt Knack. You will receive a reminder letter in the mail two months in advance. If you don't receive a letter, please call our office to schedule the follow-up appointment.  Any Other Special Instructions Will Be Listed Below (If Applicable).   Thank you for choosing [facility]    [phone]  If you need a refill on your cardiac medications before your next appointment, please call your pharmacy.      Signed, Daune Perch, NP  05/28/2018 5:27 PM    [facility]

## 2018-06-02 ENCOUNTER — Other Ambulatory Visit: Payer: Self-pay | Admitting: Rheumatology

## 2018-06-02 ENCOUNTER — Other Ambulatory Visit: Payer: Self-pay | Admitting: Physician Assistant

## 2018-06-02 MED FILL — LEFLUNOMIDE 20 MG TABLET: 20 | 30 days supply | Qty: 30 | Fill #1

## 2018-06-02 MED FILL — raNITIdine HCL 300 MG TABS: 300 | 30 days supply | Qty: 30 | Fill #7

## 2018-06-02 MED FILL — NABUMETONE 750 MG TABLET: 750 | 30 days supply | Qty: 60 | Fill #0

## 2018-06-02 MED FILL — MONTELUKAST SOD 10 MG TAB: 10 | 30 days supply | Qty: 30 | Fill #0

## 2018-06-02 MED FILL — CYCLOBENZAPRINE HCL 10 MG T: 10 | 30 days supply | Qty: 30 | Fill #3

## 2018-06-02 NOTE — Telephone Encounter (Signed)
Last visit: 01/02/2018 Next visit: 06/23/2018 Labs: 04/25/18 elevated glucose  Okay to refill per Dr. Estanislado Pandy.

## 2018-06-03 ENCOUNTER — Telehealth (HOSPITAL_COMMUNITY): Payer: Self-pay | Admitting: *Deleted

## 2018-06-03 MED FILL — SIMPONI 100 MG/ML SOAJ: 100 | 30 days supply | Qty: 1 | Fill #1

## 2018-06-03 NOTE — Telephone Encounter (Signed)
Patient given detailed instructions per Myocardial Perfusion Study Information Sheet for the test on 06/06/18. Patient notified to arrive 15 minutes early and that it is imperative to arrive on time for appointment to keep from having the test rescheduled.  If you need to cancel or reschedule your appointment, please call the office within 24 hours of your appointment. . Patient verbalized understanding. Kirstie Peri

## 2018-06-05 NOTE — Telephone Encounter (Signed)
I s/w [name] at [facility] and informed pt will be needing further testing before she can be cleared for her surgery. I advised Myoview is scheduled for 06/06/18. Advised once Myoview has been read we will let them know if pt has been cleared or if she is going to need further testing. Nira Conn thanked me for the help.

## 2018-06-05 NOTE — Telephone Encounter (Signed)
[name] is returning your call back to [name] please call at [phone]

## 2018-06-05 NOTE — Telephone Encounter (Signed)
Received fax today in regards to surgery clearance for pt, that they have not heard back about clearance form our office. I looked in to the pt's chart and saw that she had an appt on 5/29 with Pecolia Ades, NP who ordered a Myoview based on pt's c/o chest pain. Pt is scheduled for Myoview 06/06/18. I called and lvm for surgery scheduler at [facility] to call back so that I may advise pt is needing further testing before she can be cleared.

## 2018-06-06 ENCOUNTER — Ambulatory Visit (HOSPITAL_COMMUNITY): Payer: 59 | Attending: Cardiology

## 2018-06-06 VITALS — Ht 63.0 in | Wt 177.0 lb

## 2018-06-06 DIAGNOSIS — E119 Type 2 diabetes mellitus without complications: Secondary | ICD-10-CM | POA: Insufficient documentation

## 2018-06-06 DIAGNOSIS — I10 Essential (primary) hypertension: Secondary | ICD-10-CM | POA: Insufficient documentation

## 2018-06-06 DIAGNOSIS — R110 Nausea: Secondary | ICD-10-CM

## 2018-06-06 DIAGNOSIS — R079 Chest pain, unspecified: Secondary | ICD-10-CM | POA: Diagnosis not present

## 2018-06-06 DIAGNOSIS — Z794 Long term (current) use of insulin: Secondary | ICD-10-CM | POA: Diagnosis not present

## 2018-06-06 LAB — MYOCARDIAL PERFUSION IMAGING
CHL CUP NUCLEAR SRS: 8
LV dias vol: 41 mL (ref 46–106)
LV sys vol: 15 mL
NUC STRESS TID: 1.07
Peak HR: 127 {beats}/min
RATE: 0.44
Rest HR: 83 {beats}/min
SDS: 1
SSS: 6

## 2018-06-06 MED ORDER — REGADENOSON 0.4 MG/5ML IV SOLN
0.4000 mg | Freq: Once | INTRAVENOUS | Status: AC
  Administered 2018-06-06: 0.4 mg via INTRAVENOUS

## 2018-06-06 MED ORDER — TECHNETIUM TC 99M TETROFOSMIN IV KIT
31.4000 | PACK | Freq: Once | INTRAVENOUS | Status: AC | PRN
  Administered 2018-06-06: 31.4 via INTRAVENOUS
  Filled 2018-06-06: qty 32

## 2018-06-06 MED ORDER — TECHNETIUM TC 99M TETROFOSMIN IV KIT
10.7000 | PACK | Freq: Once | INTRAVENOUS | Status: AC | PRN
  Administered 2018-06-06: 10.7 via INTRAVENOUS
  Filled 2018-06-06: qty 11

## 2018-06-06 MED ORDER — AMINOPHYLLINE 25 MG/ML IV SOLN
150.0000 mg | Freq: Once | INTRAVENOUS | Status: AC
  Administered 2018-06-06: 150 mg via INTRAVENOUS

## 2018-06-06 NOTE — Telephone Encounter (Signed)
   Primary Cardiologist: Sherren Mocha, MD  Chart reviewed as part of pre-operative protocol coverage. Given past medical history and complaints of atypical chest discomfort, Ms. Murrill was seen in our office and underwent a stress myoview which was normal.  Based on ACC/AHA guidelines, JEANY SEVILLE would be at acceptable risk for the planned procedure without further cardiovascular testing.   Per Dr. Burt Knack, her plavix can be held for the requested amount of time for the procedure. Resume when safe after the procedure.   I will route this recommendation to the requesting party via Epic fax function and remove from pre-op pool.  Please call with questions.  Daune Perch, NP 06/06/2018, 3:30 PM

## 2018-06-09 ENCOUNTER — Telehealth: Payer: Self-pay | Admitting: Cardiovascular Disease

## 2018-06-09 MED FILL — diazePAM 5 MG TABS: 5 | 1 days supply | Qty: 4 | Fill #0

## 2018-06-09 NOTE — Telephone Encounter (Signed)
Fax was resent to new fax number. Original encounter documenting clearance was sent.

## 2018-06-09 NOTE — Progress Notes (Signed)
Office Visit Note  Patient: [name]             Date of Birth: 09/27/1966           MRN: [id number]             PCP: Lavada Mesi Referring: Donella Stade, PA-C Visit Date: 06/23/2018 Occupation: @GUAROCC @    Subjective:  Follow-up (R SHOULDER PAIN AND R KNEE PAIN AND BI LAT HAND PAIN)   History of Present Illness: [name] is a 52 y.o. female with seronegative rheumatoid arthritis.  She states she has been having a flare with increased pain and swelling in her joints.  She complains of discomfort in her right shoulder right knee and both hands.  Is noticing some swelling in her hands.  After her last visit because she was doing well with reduced her Plaquenil dose to once a day.  Activities of Daily Living:  Patient reports morning stiffness for 2 hours   Patient Reports nocturnal pain.  Difficulty dressing/grooming: Denies Difficulty climbing stairs: Reports Difficulty getting out of chair: Reports Difficulty using hands for taps, buttons, cutlery, and/or writing: Denies   Review of Systems  Constitutional: Positive for fatigue. Negative for fever, night sweats, weight gain and weight loss.  HENT: Negative for ear pain, mouth sores, trouble swallowing, trouble swallowing, mouth dryness and nose dryness.   Eyes: Negative for pain, redness, visual disturbance and dryness.  Respiratory: Negative for cough, shortness of breath and difficulty breathing.   Cardiovascular: Negative for chest pain, palpitations, hypertension, irregular heartbeat and swelling in legs/feet.  Gastrointestinal: Positive for constipation and diarrhea. Negative for blood in stool.  Endocrine: Negative for increased urination.  Genitourinary: Negative for difficulty urinating and vaginal dryness.  Musculoskeletal: Positive for arthralgias, joint pain, joint swelling, myalgias, muscle weakness, morning stiffness and myalgias. Negative for muscle tenderness.  Skin: Negative for color  change, rash, hair loss, skin tightness, ulcers and sensitivity to sunlight.  Allergic/Immunologic: Negative for susceptible to infections.  Neurological: Negative for dizziness, numbness, memory loss, night sweats and weakness.  Hematological: Negative for bruising/bleeding tendency and swollen glands.  Psychiatric/Behavioral: Positive for sleep disturbance. Negative for depressed mood and suicidal ideas. The patient is not nervous/anxious.     PMFS History:  Patient Active Problem List   Diagnosis Date Noted  . Vitamin D deficiency 07/04/2017  . B12 deficiency 07/04/2017  . Abnormal CT scan 06/19/2017  . Gastroesophageal reflux disease 06/19/2017  . Antiplatelet or antithrombotic long-term use 06/19/2017  . Sinus pressure 03/15/2017  . Nasal congestion 03/15/2017  . Graves disease 02/01/2017  . Sleep difficulties 02/01/2017  . No energy 02/01/2017  . Osteopenia 02/01/2017  . Hypertension, essential 05/08/2016  . IDDM (insulin dependent diabetes mellitus) ([place]) 05/08/2016  . Coronary artery disease involving native coronary artery of native heart with angina pectoris ([place])   . HYPERTHYROIDISM 11/09/2010  . SINUS TACHYCARDIA 11/08/2010  . SHORTNESS OF BREATH 11/08/2010  . DERMATITIS, ALLERGIC 07/20/2010  . EDEMA 07/12/2010  . DIZZINESS 11/14/2009  . ATRIAL FIBRILLATION 07/20/2009  . CAD, ARTERY BYPASS GRAFT 06/07/2009  . ANGINA, STABLE/EXERTIONAL 06/02/2009  . HYPERLIPIDEMIA-MIXED 04/26/2009  . ACUT MI ANTEROLAT WALL SUBSQT EPIS CARE 04/26/2009  . Chest pain 04/26/2009  . DIABETIC  RETINOPATHY 04/25/2009  . CARPAL TUNNEL SYNDROME, BILATERAL 04/25/2009  . TRIGGER FINGER 04/25/2009  . MIGRAINE W/O AURA W/INTRACT W/STATUS MIGRAINOSUS 02/19/2008  . ALLERGIC RHINITIS, SEASONAL 10/16/2006  . CHOLELITHIASIS 10/16/2006  . Rheumatoid arthritis ([place]) 10/16/2006  Past Medical History:  Diagnosis Date  . ACUT MI ANTEROLAT WALL SUBSQT EPIS CARE   . Acute maxillary sinusitis   .  ALLERGIC RHINITIS, SEASONAL   . ARTHRITIS, RHEUMATOID   . Atrial fibrillation ([place])    a. after CABG.  . Back pain   . CAD, ARTERY BYPASS GRAFT    a. DES to RCA in 2010 then LAD occlusion s/p CABG 3 06/07/2009 with LIMA to LAD, reverse SVG to D1, reverse SVG to distal RCA. b. Cath 05/08/2016 slightly hypodense region in the intermediate branch, however she had excellent flow, FFR was normal. Vein graft to PDA and the posterolateral branch is patent, patent LIMA to LAD, occluded SVG to diagonal.  . CARPAL TUNNEL SYNDROME, BILATERAL   . CHOLELITHIASIS   . Contrast media allergy   . DERMATITIS, ALLERGIC   . DIABETES MELLITUS, TYPE I    on insulin pump  . DIABETIC  RETINOPATHY   . Hiatal hernia   . HYPERLIPIDEMIA-MIXED   . HYPERTHYROIDISM   . Insulin pump in place   . MIGRAINE W/O AURA W/INTRACT W/STATUS MIGRAINOSUS 02/19/2008  . SINUS TACHYCARDIA 11/08/2010  . TRIGGER FINGER   . URI     Family History  Problem Relation Age of Onset  . Colon polyps Father   . Diabetes Unknown   . Heart disease Unknown   . Hypertension Unknown   . Hyperlipidemia Unknown   . Depression Unknown   . Migraines Unknown   . Stroke Paternal Grandmother   . Heart attack Neg Hx   . Colon cancer Neg Hx   . Stomach cancer Neg Hx   . Esophageal cancer Neg Hx    Past Surgical History:  Procedure Laterality Date  . ABDOMINAL HYSTERECTOMY    . Caesarean section    . CARDIAC CATHETERIZATION N/A 05/08/2016   Procedure: Left Heart Cath and Cors/Grafts Angiography;  Surgeon: Burnell Blanks, MD;  Location: [facility];  Service: Cardiovascular;  Laterality: N/A;  . CARPAL TUNNEL RELEASE    . CHOLECYSTECTOMY    . CORONARY ARTERY BYPASS GRAFT    . VITRECTOMY     Social History   Social History Narrative   Divorced. Has 2 kids(fraternal twins) daughters age 30. Works at [employer], Never smoked, denies ETOH, no drugs. Drinks diet coke. No exercise.         Objective: Vital Signs: BP  132/81 (BP Location: Left Arm, Patient Position: Sitting, Cuff Size: Normal)   Pulse 65   Ht 5\' 3"  (1.6 m)   Wt 178 lb (80.7 kg)   BMI 31.53 kg/m    Physical Exam  Constitutional: She is oriented to person, place, and time. She appears well-developed and well-nourished.  HENT:  Head: Normocephalic and atraumatic.  Eyes: Conjunctivae and EOM are normal.  Neck: Normal range of motion.  Cardiovascular: Normal rate, regular rhythm, normal heart sounds and intact distal pulses.  Pulmonary/Chest: Effort normal and breath sounds normal.  Abdominal: Soft. Bowel sounds are normal.  Lymphadenopathy:    She has no cervical adenopathy.  Neurological: She is alert and oriented to person, place, and time.  Skin: Skin is warm and dry. Capillary refill takes less than 2 seconds.  Psychiatric: She has a normal mood and affect. Her behavior is normal.  Nursing note and vitals reviewed.    Musculoskeletal Exam: C-spine limited range of motion.  She had thoracic kyphosis.  She had painful range of motion of her right shoulder joint which was limited.  She has some discomfort in her right elbow joints.  She has some MCP and PIP synovitis and synovial thickening as described below.  She has warmth and discomfort in her right knee joint.  CDAI Exam: CDAI Homunculus Exam:   Tenderness:  RUE: glenohumeral Right hand: 2nd MCP, 3rd MCP and 3rd PIP Left hand: 2nd MCP, 3rd MCP and 3rd PIP RLE: tibiofemoral  Swelling:  Left hand: 3rd PIP RLE: tibiofemoral  Joint Counts:  CDAI Tender Joint count: 8 CDAI Swollen Joint count: 2  Global Assessments:  Patient Global Assessment: 6 Provider Global Assessment: 6  CDAI Calculated Score: 22    Investigation: No additional findings.TB Gold: 08/02/2017 Negative  CBC Latest Ref Rng & Units 04/25/2018 01/02/2018 08/02/2017  WBC 3.8 - 10.8 Thousand/uL 4.9 4.6 5.2  Hemoglobin 11.7 - 15.5 g/dL 12.6 13.8 13.7  Hematocrit 35.0 - 45.0 % 35.7 38.6 38.9  Platelets 140  - 400 Thousand/uL 205 178 195   CMP Latest Ref Rng & Units 04/25/2018 01/02/2018 08/02/2017  Glucose 65 - 99 mg/dL 131(H) 73 109(H)  BUN 7 - 25 mg/dL 7 7 6(L)  Creatinine 0.50 - 1.05 mg/dL 0.90 0.79 0.73  Sodium 135 - 146 mmol/L 143 142 136  Potassium 3.5 - 5.3 mmol/L 4.1 4.1 3.6  Chloride 98 - 110 mmol/L 106 105 100  CO2 20 - 32 mmol/L 26 30 23   Calcium 8.6 - 10.4 mg/dL 9.3 9.3 8.9  Total Protein 6.1 - 8.1 g/dL 6.1 6.3 6.6  Total Bilirubin 0.2 - 1.2 mg/dL 0.4 0.5 0.5  Alkaline Phos 33 - 115 U/L - - 70  AST 10 - 35 U/L 18 22 18   ALT 6 - 29 U/L 16 27 13     Imaging: No results found.  Speciality Comments: PLQ Eye Exam: 01/01/18 WNL @ [facility]     Procedures:  Large Joint Inj: R knee on 06/23/2018 11:44 AM Indications: pain Details: 27 G 1.5 in needle, medial approach  Arthrogram: No  Medications: 1.5 mL lidocaine (PF) 1 %; 40 mg triamcinolone acetonide 40 MG/ML Aspirate: 0 mL Outcome: tolerated well, no immediate complications Procedure, treatment alternatives, risks and benefits explained, specific risks discussed. Consent was given by the patient. Immediately prior to procedure a time out was called to verify the correct patient, procedure, equipment, support staff and site/side marked as required. Patient was prepped and draped in the usual sterile fashion.     Allergies: Prochlorperazine; Ramipril; Shellfish-derived products; Atorvastatin; Compazine  [prochlorperazine edisylate]; Etanercept; Infliximab; Iohexol; Orencia [abatacept]; Shellfish allergy; Tofacitinib; Prochlorperazine edisylate; Amiodarone; and Rituximab   Assessment / Plan:     Visit Diagnoses: Rheumatoid arthritis of multiple sites with negative rheumatoid factor (HCC) - RF negative, anti-CCP negative, positive ANA.  Patient has flare currently with pain and swelling in multiple joints as described above.  She believes she was doing much better on Plaquenil twice daily dosing and has been  having increased flares and discomfort on decreased dose of Plaquenil.  Psoriasis-she had no active lesions on examination today.  High risk medication use - PLQ 200 mg po qd, Simponi SQ q month, Arava 20 mg po qd, Relafen 750 mg po bid.  Her labs have been stable.  We will continue to monitor labs every 3 months.  Acute pain of right knee-she has some warmth on palpation.  She is been having discomfort walking.  She states her sugar has been under good control.  After informed consent was obtained right knee joint  was injected with cortisone as described above.  She will monitor her blood sugar closely.  Other fatigue-she has been experiencing increased fatigue due to arthritis flare.  Other insomnia-insomnia is controlled  Osteopenia of multiple sites-she is on calcium and vitamin D  History of vitamin D deficiency  Antiplatelet or antithrombotic long-term use - Secondary to coronary artery disease  History of hyperlipidemia  History of coronary artery disease  History of hypertension  History of diabetes mellitus - She is on insulin  History of migraine  History of Graves' disease  History of gastroesophageal reflux (GERD)    Orders: Orders Placed This Encounter  Procedures  . Large Joint Inj   Meds ordered this encounter  Medications  . hydroxychloroquine (PLAQUENIL) 200 MG tablet    Sig: Take 1 tablet by mouth twice daily.    Dispense:  180 tablet    Refill:  0  . cyclobenzaprine (FLEXERIL) 10 MG tablet    Sig: Take 1 tablet (10 mg total) by mouth at bedtime.    Dispense:  30 tablet    Refill:  2  . folic acid (FOLVITE) 1 MG tablet    Sig: Take 2 tablets (2 mg total) by mouth daily.    Dispense:  180 tablet    Refill:  3  . leflunomide (ARAVA) 20 MG tablet    Sig: Take 1 tablet (20 mg total) by mouth daily.    Dispense:  30 tablet    Refill:  2    Face-to-face time spent with patient was 30 minutes. >50% of time was spent in counseling and coordination  of care.  Follow-Up Instructions: Return in about 5 months (around 11/23/2018) for Rheumatoid arthritis.   Bo Merino, MD  Note - This record has been created using Editor, commissioning.  Chart creation errors have been sought, but may not always  have been located. Such creation errors do not reflect on  the standard of medical care.

## 2018-06-09 NOTE — Telephone Encounter (Signed)
[name] calling and stated they lost power and for some reason they now have a new fax number which is fax # [phone]. Please re fax the pre op information to this fax. Please call if any questions.

## 2018-06-11 ENCOUNTER — Ambulatory Visit: Payer: Self-pay | Admitting: Cardiology

## 2018-06-11 ENCOUNTER — Encounter

## 2018-06-23 ENCOUNTER — Encounter: Payer: Self-pay | Admitting: Rheumatology

## 2018-06-23 ENCOUNTER — Ambulatory Visit: Payer: 59 | Admitting: Rheumatology

## 2018-06-23 VITALS — BP 132/81 | HR 65 | Ht 63.0 in | Wt 178.0 lb

## 2018-06-23 DIAGNOSIS — Z8679 Personal history of other diseases of the circulatory system: Secondary | ICD-10-CM

## 2018-06-23 DIAGNOSIS — G4709 Other insomnia: Secondary | ICD-10-CM

## 2018-06-23 DIAGNOSIS — M8589 Other specified disorders of bone density and structure, multiple sites: Secondary | ICD-10-CM

## 2018-06-23 DIAGNOSIS — Z79899 Other long term (current) drug therapy: Secondary | ICD-10-CM

## 2018-06-23 DIAGNOSIS — R5383 Other fatigue: Secondary | ICD-10-CM | POA: Diagnosis not present

## 2018-06-23 DIAGNOSIS — Z8669 Personal history of other diseases of the nervous system and sense organs: Secondary | ICD-10-CM

## 2018-06-23 DIAGNOSIS — L409 Psoriasis, unspecified: Secondary | ICD-10-CM | POA: Diagnosis not present

## 2018-06-23 DIAGNOSIS — Z7902 Long term (current) use of antithrombotics/antiplatelets: Secondary | ICD-10-CM

## 2018-06-23 DIAGNOSIS — Z8639 Personal history of other endocrine, nutritional and metabolic disease: Secondary | ICD-10-CM

## 2018-06-23 DIAGNOSIS — Z8719 Personal history of other diseases of the digestive system: Secondary | ICD-10-CM | POA: Diagnosis not present

## 2018-06-23 DIAGNOSIS — M0609 Rheumatoid arthritis without rheumatoid factor, multiple sites: Secondary | ICD-10-CM

## 2018-06-23 DIAGNOSIS — M25561 Pain in right knee: Secondary | ICD-10-CM | POA: Diagnosis not present

## 2018-06-23 MED ORDER — LIDOCAINE HCL (PF) 1 % IJ SOLN
1.5000 mL | INTRAMUSCULAR | Status: AC | PRN
  Administered 2018-06-23: 1.5 mL

## 2018-06-23 MED ORDER — FOLIC ACID 1 MG PO TABS: 2.0000 mg | ORAL_TABLET | Freq: Every day | ORAL | 3 refills | Status: DC

## 2018-06-23 MED ORDER — CYCLOBENZAPRINE HCL 10 MG PO TABS: 10.0000 mg | ORAL_TABLET | Freq: Every day | ORAL | 2 refills | Status: DC

## 2018-06-23 MED ORDER — LEFLUNOMIDE 20 MG PO TABS: 20.0000 mg | ORAL_TABLET | Freq: Every day | ORAL | 2 refills | Status: DC

## 2018-06-23 MED ORDER — TRIAMCINOLONE ACETONIDE 40 MG/ML IJ SUSP
40.0000 mg | INTRAMUSCULAR | Status: AC | PRN
  Administered 2018-06-23: 40 mg via INTRA_ARTICULAR

## 2018-06-23 MED ORDER — HYDROXYCHLOROQUINE SULFATE 200 MG PO TABS: ORAL_TABLET | ORAL | 0 refills | Status: DC

## 2018-06-23 MED FILL — HYDROXYCHLOROQUINE SULFATE: 200 | 90 days supply | Qty: 180 | Fill #0

## 2018-06-23 MED FILL — FOLIC ACID 1 MG TABS: 1 | 90 days supply | Qty: 180 | Fill #0

## 2018-06-23 NOTE — Patient Instructions (Signed)
Standing Labs We placed an order today for your standing lab work.    Please come back and get your standing labs in July and every 3 months   We have open lab Monday through Friday from 8:30-11:30 AM and 1:30-4:00 PM  at the office of Dr. Izzabelle Bouley.   You may experience shorter wait times on Monday and Friday afternoons. The office is located at [address], [address], [place], [place] [address] No appointment is necessary.   Labs are drawn by [facility].  You may receive a bill from [facility] for your lab work. If you have any questions regarding directions or hours of operation,  please call [phone].    

## 2018-06-24 DIAGNOSIS — E1039 Type 1 diabetes mellitus with other diabetic ophthalmic complication: Secondary | ICD-10-CM | POA: Diagnosis not present

## 2018-06-24 DIAGNOSIS — E1065 Type 1 diabetes mellitus with hyperglycemia: Secondary | ICD-10-CM | POA: Diagnosis not present

## 2018-06-25 ENCOUNTER — Ambulatory Visit (INDEPENDENT_AMBULATORY_CARE_PROVIDER_SITE_OTHER): Payer: 59 | Admitting: Physician Assistant

## 2018-06-25 ENCOUNTER — Encounter: Payer: Self-pay | Admitting: Physician Assistant

## 2018-06-25 VITALS — BP 140/82 | HR 85 | Wt 177.0 lb

## 2018-06-25 DIAGNOSIS — Z23 Encounter for immunization: Secondary | ICD-10-CM | POA: Diagnosis not present

## 2018-06-25 DIAGNOSIS — R0982 Postnasal drip: Secondary | ICD-10-CM | POA: Diagnosis not present

## 2018-06-25 DIAGNOSIS — R05 Cough: Secondary | ICD-10-CM

## 2018-06-25 DIAGNOSIS — R059 Cough, unspecified: Secondary | ICD-10-CM

## 2018-06-25 DIAGNOSIS — Z0000 Encounter for general adult medical examination without abnormal findings: Secondary | ICD-10-CM

## 2018-06-25 MED ORDER — BECLOMETHASONE DIPROPIONATE 80 MCG/ACT NA AERS: 2.0000 | INHALATION_SPRAY | Freq: Every day | NASAL | 2 refills | Status: DC

## 2018-06-25 MED FILL — METOPROLOL TARTRATE 25 MG T: 25 | 90 days supply | Qty: 180 | Fill #3

## 2018-06-25 NOTE — Progress Notes (Signed)
Subjective:     [name] is a 52 y.o. female and is here for a comprehensive physical exam. The patient reports problems - pt is having some cough and PND. qnasl worked well last year. request prescription. .  Social History   Socioeconomic History  . Marital status: Legally Separated    Spouse name: Not on file  . Number of children: Not on file  . Years of education: Not on file  . Highest education level: Not on file  Occupational History  . Not on file  Social Needs  . Financial resource strain: Not on file  . Food insecurity:    Worry: Not on file    Inability: Not on file  . Transportation needs:    Medical: Not on file    Non-medical: Not on file  Tobacco Use  . Smoking status: Never Smoker  . Smokeless tobacco: Never Used  Substance and Sexual Activity  . Alcohol use: Yes    Comment: rarely 1 every 6 months  . Drug use: No  . Sexual activity: Not Currently    Partners: Male    Birth control/protection: None  Lifestyle  . Physical activity:    Days per week: Not on file    Minutes per session: Not on file  . Stress: Not on file  Relationships  . Social connections:    Talks on phone: Not on file    Gets together: Not on file    Attends religious service: Not on file    Active member of club or organization: Not on file    Attends meetings of clubs or organizations: Not on file    Relationship status: Not on file  . Intimate partner violence:    Fear of current or ex partner: Not on file    Emotionally abused: Not on file    Physically abused: Not on file    Forced sexual activity: Not on file  Other Topics Concern  . Not on file  Social History Narrative   Divorced. Has 2 kids(fraternal twins) daughters age 3. Works at [employer], Never smoked, denies ETOH, no drugs. Drinks diet coke. No exercise.       Health Maintenance  Topic Date Due  . OPHTHALMOLOGY EXAM  10/30/1977  . HEMOGLOBIN A1C  03/25/2017  . HIV Screening  06/26/2019  (Originally 10/30/1982)  . INFLUENZA VACCINE  07/31/2018  . FOOT EXAM  08/02/2018  . MAMMOGRAM  03/28/2020  . COLONOSCOPY  08/03/2027  . TETANUS/TDAP  06/25/2028  . PNEUMOCOCCAL POLYSACCHARIDE VACCINE  Completed    The following portions of the patient's history were reviewed and updated as appropriate: allergies, current medications, past family history, past medical history, past social history, past surgical history and problem list.  Review of Systems Pertinent items noted in HPI and remainder of comprehensive ROS otherwise negative.   Objective:    BP 140/82 (BP Location: Left Arm)   Pulse 85   Wt 177 lb (80.3 kg)   SpO2 100%   BMI 31.35 kg/m  General appearance: alert, cooperative and appears stated age Head: Normocephalic, without obvious abnormality, atraumatic Eyes: conjunctivae/corneas clear. PERRL, EOM's intact. Fundi benign. Ears: normal TM's and external ear canals both ears Nose: Nares normal. Septum midline. Mucosa normal. No drainage or sinus tenderness. Throat: lips, mucosa, and tongue normal; teeth and gums normal Neck: no adenopathy, no carotid bruit, no JVD, supple, symmetrical, trachea midline and thyroid not enlarged, symmetric, no tenderness/mass/nodules Back: symmetric, no curvature. ROM normal.  No CVA tenderness. Lungs: clear to auscultation bilaterally Heart: regular rate and rhythm, S1, S2 normal, no murmur, click, rub or gallop Abdomen: soft, non-tender; bowel sounds normal; no masses,  no organomegaly Extremities: extremities normal, atraumatic, no cyanosis or edema Pulses: 2+ and symmetric Skin: Skin color, texture, turgor normal. No rashes or lesions Lymph nodes: Cervical, supraclavicular, and axillary nodes normal. Neurologic: Alert and oriented X 3, normal strength and tone. Normal symmetric reflexes. Normal coordination and gait    Assessment:    Healthy female exam.      Plan:    .Marland KitchenJacee was seen today for annual exam.  Diagnoses and  all orders for this visit:  Routine physical examination  Cough -     Beclomethasone Dipropionate (QNASL) 80 MCG/ACT AERS; Place 2 sprays into both nostrils daily.  PND (post-nasal drip) -     Beclomethasone Dipropionate (QNASL) 80 MCG/ACT AERS; Place 2 sprays into both nostrils daily.  Need for Tdap vaccination -     Tdap vaccine greater than or equal to 7yo IM   .Marland Kitchen Depression screen [facility] 2/9 06/25/2018 06/06/2017 03/13/2017 07/31/2016  Decreased Interest 1 1 2  0  Down, Depressed, Hopeless 0 0 0 0  PHQ - 2 Score 1 1 2  0  Altered sleeping 1 - - -  Tired, decreased energy 1 - - -  Change in appetite 0 - - -  Feeling bad or failure about yourself  0 - - -  Trouble concentrating 0 - - -  Moving slowly or fidgety/restless 0 - - -  Suicidal thoughts 0 - - -  PHQ-9 Score 3 - - -  Difficult doing work/chores Somewhat difficult - - -   .Marland Kitchen GAD 7 : Generalized Anxiety Score 06/25/2018  Nervous, Anxious, on Edge 0  Control/stop worrying 0  Worry too much - different things 0  Trouble relaxing 1  Restless 0  Easily annoyed or irritable 0  Afraid - awful might happen 0  Total GAD 7 Score 1  Anxiety Difficulty Somewhat difficult    .Marland Kitchen Discussed 150 minutes of exercise a week.  Encouraged vitamin D 1000 units and Calcium 1300mg  or 4 servings of dairy a day.  Labs up to date.  Mammogram/colonoscopy up to date.  Tdap given today.   Recent full cardiac work up negative after episodes of CP.   Diabetes managed by Dr. Steffanie Dunn [facility].   PND will try qnasl again.   See After Visit Summary for Counseling Recommendations

## 2018-06-25 NOTE — Patient Instructions (Signed)

## 2018-06-26 ENCOUNTER — Telehealth: Payer: Self-pay | Admitting: Physician Assistant

## 2018-06-26 DIAGNOSIS — R0982 Postnasal drip: Secondary | ICD-10-CM | POA: Insufficient documentation

## 2018-06-26 DIAGNOSIS — R05 Cough: Secondary | ICD-10-CM | POA: Insufficient documentation

## 2018-06-26 DIAGNOSIS — R059 Cough, unspecified: Secondary | ICD-10-CM | POA: Insufficient documentation

## 2018-06-26 MED FILL — CYCLOBENZAPRINE HCL 10 MG T: 10 | 30 days supply | Qty: 30 | Fill #4

## 2018-06-26 MED FILL — CONTOUR NEXT STRIPS: 30 days supply | Qty: 300 | Fill #1

## 2018-06-26 MED FILL — SIMPONI 100 MG/ML SOAJ: 100 | 30 days supply | Qty: 1 | Fill #2

## 2018-06-26 MED FILL — LEFLUNOMIDE 20 MG TABLET: 20 | 30 days supply | Qty: 30 | Fill #2

## 2018-06-26 NOTE — Telephone Encounter (Signed)
Need to abstract in  Dr. Christell Faith Dr. Zigmund Daniel eye exam at [facility]

## 2018-06-27 NOTE — Telephone Encounter (Signed)
Printed for abstraction.

## 2018-07-01 ENCOUNTER — Telehealth: Payer: Self-pay | Admitting: Physician Assistant

## 2018-07-01 NOTE — Telephone Encounter (Signed)
Insurance will not cover the Qnsal and the patient it will cover Flunisolide. Please send to the pharmacy.

## 2018-07-02 MED ORDER — FLUNISOLIDE 25 MCG/ACT (0.025%) NA SOLN: 2.0000 | Freq: Two times a day (BID) | NASAL | 5 refills | Status: DC

## 2018-07-02 MED FILL — FLUNISOLIDE 0.025% SPRAY: 25 MCG/ACT | 30 days supply | Qty: 25 | Fill #0

## 2018-07-02 MED FILL — NABUMETONE 750 MG TABLET: 750 | 30 days supply | Qty: 60 | Fill #1

## 2018-07-02 MED FILL — MONTELUKAST SOD 10 MG TAB: 10 | 30 days supply | Qty: 30 | Fill #1

## 2018-07-02 MED FILL — raNITIdine HCL 300 MG TABS: 300 | 30 days supply | Qty: 30 | Fill #8

## 2018-07-02 NOTE — Telephone Encounter (Signed)
Sent!

## 2018-07-07 ENCOUNTER — Ambulatory Visit (INDEPENDENT_AMBULATORY_CARE_PROVIDER_SITE_OTHER): Payer: 59 | Admitting: Physician Assistant

## 2018-07-07 ENCOUNTER — Encounter: Payer: Self-pay | Admitting: Physician Assistant

## 2018-07-07 VITALS — BP 122/57 | HR 68 | Temp 98.5°F | Ht 62.99 in | Wt 178.0 lb

## 2018-07-07 DIAGNOSIS — R059 Cough, unspecified: Secondary | ICD-10-CM

## 2018-07-07 DIAGNOSIS — R05 Cough: Secondary | ICD-10-CM

## 2018-07-07 DIAGNOSIS — J0100 Acute maxillary sinusitis, unspecified: Secondary | ICD-10-CM

## 2018-07-07 MED ORDER — HYDROCODONE-HOMATROPINE 5-1.5 MG/5ML PO SYRP: 5.0000 mL | ORAL_SOLUTION | Freq: Two times a day (BID) | ORAL | 0 refills | Status: DC

## 2018-07-07 MED ORDER — AMOXICILLIN-POT CLAVULANATE 875-125 MG PO TABS: 1.0000 | ORAL_TABLET | Freq: Two times a day (BID) | ORAL | 0 refills | Status: DC

## 2018-07-07 MED FILL — ISOSORBIDE MN ER 60 MG TAB: 60 | 30 days supply | Qty: 30 | Fill #1

## 2018-07-07 MED FILL — HumaLOG 100 UNIT/ML SOLN: 100 | 89 days supply | Qty: 80 | Fill #1

## 2018-07-07 MED FILL — LOSARTAN POTASSIUM 50 MG TA: 50 | 90 days supply | Qty: 90 | Fill #3

## 2018-07-07 MED FILL — AMOX-CLAV 875-125 MG TABLET: 875-125 | 10 days supply | Qty: 20 | Fill #0

## 2018-07-07 NOTE — Patient Instructions (Signed)

## 2018-07-07 NOTE — Progress Notes (Signed)
Subjective:    Patient ID: [name], female    DOB: [birth year], 52 y.o.   MRN: [id number]  HPI Pt is a 52 yo female with hx of CAD, MI, HTN, IDDM, who presents to the clinic with sinus pressure, cough, ear pain that has presented for about one week but worsened on Friday. She has tried mucinex D, tylenol, ibuprofen, delsym with no relief. She has lots of facial pressure and teeth pain. No fever, chills, SOB or wheezing.   .. Active Ambulatory Problems    Diagnosis Date Noted  . HYPERTHYROIDISM 11/09/2010  . DIABETIC  RETINOPATHY 04/25/2009  . HYPERLIPIDEMIA-MIXED 04/26/2009  . MIGRAINE W/O AURA W/INTRACT W/STATUS MIGRAINOSUS 02/19/2008  . CARPAL TUNNEL SYNDROME, BILATERAL 04/25/2009  . ACUT MI ANTEROLAT WALL SUBSQT EPIS CARE 04/26/2009  . ANGINA, STABLE/EXERTIONAL 06/02/2009  . CAD, ARTERY BYPASS GRAFT 06/07/2009  . ATRIAL FIBRILLATION 07/20/2009  . SINUS TACHYCARDIA 11/08/2010  . ALLERGIC RHINITIS, SEASONAL 10/16/2006  . CHOLELITHIASIS 10/16/2006  . DERMATITIS, ALLERGIC 07/20/2010  . Rheumatoid arthritis ([place]) 10/16/2006  . TRIGGER FINGER 04/25/2009  . DIZZINESS 11/14/2009  . EDEMA 07/12/2010  . SHORTNESS OF BREATH 11/08/2010  . Chest pain 04/26/2009  . Hypertension, essential 05/08/2016  . IDDM (insulin dependent diabetes mellitus) ([place]) 05/08/2016  . Coronary artery disease involving native coronary artery of native heart with angina pectoris ([place])   . Graves disease 02/01/2017  . Sleep difficulties 02/01/2017  . No energy 02/01/2017  . Osteopenia 02/01/2017  . Sinus pressure 03/15/2017  . Nasal congestion 03/15/2017  . Abnormal CT scan 06/19/2017  . Gastroesophageal reflux disease 06/19/2017  . Antiplatelet or antithrombotic long-term use 06/19/2017  . Vitamin D deficiency 07/04/2017  . B12 deficiency 07/04/2017  . Cough 06/26/2018  . PND (post-nasal drip) 06/26/2018   Resolved Ambulatory Problems    Diagnosis Date Noted  . DIABETES MELLITUS, TYPE I  10/16/2006  . COMMON MIGRAINE 01/24/2007  . ESSENTIAL HYPERTENSION 10/16/2006  . Acute maxillary sinusitis 10/16/2010  . URI 01/24/2007  . Diarrhea 11/08/2010  . Chest pain syndrome 05/08/2016  . Hyperlipidemia, mixed 05/08/2016  . Headache around the eyes 03/15/2017  . Cecum mass 05/22/2017  . RLQ abdominal pain 06/19/2017   Past Medical History:  Diagnosis Date  . ACUT MI ANTEROLAT WALL SUBSQT EPIS CARE   . Acute maxillary sinusitis   . ALLERGIC RHINITIS, SEASONAL   . ARTHRITIS, RHEUMATOID   . Atrial fibrillation ([place])   . Back pain   . CAD, ARTERY BYPASS GRAFT   . CARPAL TUNNEL SYNDROME, BILATERAL   . CHOLELITHIASIS   . Contrast media allergy   . DERMATITIS, ALLERGIC   . DIABETES MELLITUS, TYPE I   . DIABETIC  RETINOPATHY   . Hiatal hernia   . HYPERLIPIDEMIA-MIXED   . HYPERTHYROIDISM   . Insulin pump in place   . MIGRAINE W/O AURA W/INTRACT W/STATUS MIGRAINOSUS 02/19/2008  . SINUS TACHYCARDIA 11/08/2010  . TRIGGER FINGER   . URI       Review of Systems See HPI.     Objective:   Physical Exam  Constitutional: She is oriented to person, place, and time. She appears well-developed and well-nourished.  HENT:  Head: Normocephalic and atraumatic.  Right Ear: External ear normal.  Left Ear: External ear normal.  Nose: Nose normal.  Mouth/Throat: Oropharynx is clear and moist. No oropharyngeal exudate.  Tenderness over maxillary sinuses to palpation.  TM's clear.   Eyes: Conjunctivae and EOM are normal. Right eye exhibits no discharge. Left eye  exhibits no discharge.  Neck: Normal range of motion. Neck supple.  Cardiovascular: Normal rate and regular rhythm.  Pulmonary/Chest: Effort normal and breath sounds normal. She has no wheezes.  Lymphadenopathy:    She has no cervical adenopathy.  Neurological: She is alert and oriented to person, place, and time.  Psychiatric: She has a normal mood and affect. Her behavior is normal.          Assessment & Plan:   .Marland KitchenAngeliah was seen today for cough and nasal congestion.  Diagnoses and all orders for this visit:  Acute non-recurrent maxillary sinusitis -     amoxicillin-clavulanate (AUGMENTIN) 875-125 MG tablet; Take 1 tablet by mouth 2 (two) times daily.  Cough -     HYDROcodone-homatropine (HYCODAN) 5-1.5 MG/5ML syrup; Take 5 mLs by mouth every 12 (twelve) hours.   HO given for sinusitis. Treated with augmentin for 10 days. Hycodan for cough at night. Discussed sedation side effects. [place] controlled substance database reviewed with no concerns.

## 2018-07-09 DIAGNOSIS — E0500 Thyrotoxicosis with diffuse goiter without thyrotoxic crisis or storm: Secondary | ICD-10-CM | POA: Diagnosis not present

## 2018-07-09 DIAGNOSIS — E10319 Type 1 diabetes mellitus with unspecified diabetic retinopathy without macular edema: Secondary | ICD-10-CM | POA: Diagnosis not present

## 2018-07-09 LAB — HEMOGLOBIN A1C: Hemoglobin A1C: 6.4

## 2018-07-10 ENCOUNTER — Encounter: Payer: Self-pay | Admitting: Medical

## 2018-07-10 ENCOUNTER — Other Ambulatory Visit: Payer: Self-pay

## 2018-07-10 ENCOUNTER — Telehealth: Payer: Self-pay

## 2018-07-10 ENCOUNTER — Emergency Department
Admission: EM | Admit: 2018-07-10 | Discharge: 2018-07-10 | Disposition: A | Discharge status: Home / Self Care | Payer: 59 | Source: Home / Self Care | Attending: Family Medicine | Admitting: Family Medicine

## 2018-07-10 ENCOUNTER — Ambulatory Visit (INDEPENDENT_AMBULATORY_CARE_PROVIDER_SITE_OTHER): Payer: Self-pay | Admitting: Medical

## 2018-07-10 ENCOUNTER — Emergency Department (INDEPENDENT_AMBULATORY_CARE_PROVIDER_SITE_OTHER): Payer: 59

## 2018-07-10 VITALS — BP 130/80 | HR 69 | Temp 98.3°F | Wt 178.0 lb

## 2018-07-10 DIAGNOSIS — R05 Cough: Secondary | ICD-10-CM

## 2018-07-10 DIAGNOSIS — J209 Acute bronchitis, unspecified: Secondary | ICD-10-CM | POA: Diagnosis not present

## 2018-07-10 DIAGNOSIS — J40 Bronchitis, not specified as acute or chronic: Secondary | ICD-10-CM

## 2018-07-10 DIAGNOSIS — R059 Cough, unspecified: Secondary | ICD-10-CM

## 2018-07-10 MED ORDER — BENZONATATE 200 MG PO CAPS: ORAL_CAPSULE | ORAL | 0 refills | Status: DC

## 2018-07-10 MED ORDER — DOXYCYCLINE HYCLATE 100 MG PO CAPS: ORAL_CAPSULE | ORAL | 0 refills | Status: DC

## 2018-07-10 MED ORDER — PREDNISONE 20 MG PO TABS: ORAL_TABLET | ORAL | 0 refills | Status: DC

## 2018-07-10 MED FILL — DOXYCYCLINE HYCLATE 100 MG: 100 | 10 days supply | Qty: 20 | Fill #0

## 2018-07-10 MED FILL — BENZONATATE 200 MG CAPS: 200 | 4 days supply | Qty: 15 | Fill #0

## 2018-07-10 MED FILL — predniSONE 20 MG TABS: 20 | 5 days supply | Qty: 12 | Fill #0

## 2018-07-10 NOTE — ED Provider Notes (Signed)
[name]    CSN: [id number] Arrival date & time: 07/10/18  1455     History   Chief Complaint Chief Complaint  Patient presents with  . Cough    HPI [name] is a 52 y.o. female.   Patient developed partially productive cough, sinus congestion, and fatigue five days ago.  Four days ago she was started on Augmentin by her PCP.  Her cough has not improved and she has had sweats/chills during the past 5 days.  She has occasional wheezing, and feels tight in her anterior chest. She has a history of seasonal rhinitis for which she takes Qnasl and Singulair.  The history is provided by the patient.    Past Medical History:  Diagnosis Date  . ACUT MI ANTEROLAT WALL SUBSQT EPIS CARE   . Acute maxillary sinusitis   . ALLERGIC RHINITIS, SEASONAL   . ARTHRITIS, RHEUMATOID   . Atrial fibrillation ([place])    a. after CABG.  . Back pain   . CAD, ARTERY BYPASS GRAFT    a. DES to RCA in 2010 then LAD occlusion s/p CABG 3 06/07/2009 with LIMA to LAD, reverse SVG to D1, reverse SVG to distal RCA. b. Cath 05/08/2016 slightly hypodense region in the intermediate branch, however she had excellent flow, FFR was normal. Vein graft to PDA and the posterolateral branch is patent, patent LIMA to LAD, occluded SVG to diagonal.  . CARPAL TUNNEL SYNDROME, BILATERAL   . CHOLELITHIASIS   . Contrast media allergy   . DERMATITIS, ALLERGIC   . DIABETES MELLITUS, TYPE I    on insulin pump  . DIABETIC  RETINOPATHY   . Hiatal hernia   . HYPERLIPIDEMIA-MIXED   . HYPERTHYROIDISM   . Insulin pump in place   . MIGRAINE W/O AURA W/INTRACT W/STATUS MIGRAINOSUS 02/19/2008  . SINUS TACHYCARDIA 11/08/2010  . TRIGGER FINGER   . URI     Patient Active Problem List   Diagnosis Date Noted  . Cough 06/26/2018  . PND (post-nasal drip) 06/26/2018  . Vitamin D deficiency 07/04/2017  . B12 deficiency 07/04/2017  . Abnormal CT scan 06/19/2017  . Gastroesophageal reflux disease 06/19/2017  .  Antiplatelet or antithrombotic long-term use 06/19/2017  . Sinus pressure 03/15/2017  . Nasal congestion 03/15/2017  . Graves disease 02/01/2017  . Sleep difficulties 02/01/2017  . No energy 02/01/2017  . Osteopenia 02/01/2017  . Hypertension, essential 05/08/2016  . IDDM (insulin dependent diabetes mellitus) ([place]) 05/08/2016  . Coronary artery disease involving native coronary artery of native heart with angina pectoris ([place])   . HYPERTHYROIDISM 11/09/2010  . SINUS TACHYCARDIA 11/08/2010  . SHORTNESS OF BREATH 11/08/2010  . DERMATITIS, ALLERGIC 07/20/2010  . EDEMA 07/12/2010  . DIZZINESS 11/14/2009  . ATRIAL FIBRILLATION 07/20/2009  . CAD, ARTERY BYPASS GRAFT 06/07/2009  . ANGINA, STABLE/EXERTIONAL 06/02/2009  . HYPERLIPIDEMIA-MIXED 04/26/2009  . ACUT MI ANTEROLAT WALL SUBSQT EPIS CARE 04/26/2009  . Chest pain 04/26/2009  . DIABETIC  RETINOPATHY 04/25/2009  . CARPAL TUNNEL SYNDROME, BILATERAL 04/25/2009  . TRIGGER FINGER 04/25/2009  . MIGRAINE W/O AURA W/INTRACT W/STATUS MIGRAINOSUS 02/19/2008  . ALLERGIC RHINITIS, SEASONAL 10/16/2006  . CHOLELITHIASIS 10/16/2006  . Rheumatoid arthritis ([place]) 10/16/2006    Past Surgical History:  Procedure Laterality Date  . ABDOMINAL HYSTERECTOMY    . Caesarean section    . CARDIAC CATHETERIZATION N/A 05/08/2016   Procedure: Left Heart Cath and Cors/Grafts Angiography;  Surgeon: Burnell Blanks, MD;  Location: [facility];  Service: Cardiovascular;  Laterality: N/A;  . CARPAL TUNNEL RELEASE    . CHOLECYSTECTOMY    . CORONARY ARTERY BYPASS GRAFT    . VITRECTOMY      OB History   None      Home Medications    Prior to Admission medications   Medication Sig Start Date End Date Taking? Authorizing Provider  aspirin 81 MG tablet Take 81 mg by mouth daily.     [provider]  Beclomethasone Dipropionate (QNASL) 80 MCG/ACT AERS Place 2 sprays into both nostrils daily. 06/25/18   Breeback, Jade L, PA-C    benzonatate (TESSALON) 200 MG capsule Take one cap by mouth at bedtime as needed for cough.  May repeat in 4 to 6 hours 07/10/18   Kandra Nicolas, MD  clopidogrel (PLAVIX) 75 MG tablet Take 1 tablet (75 mg total) by mouth daily. 07/11/17   Sherren Mocha, MD  cyclobenzaprine (FLEXERIL) 10 MG tablet Take 1 tablet (10 mg total) by mouth at bedtime. 06/23/18   Bo Merino, MD  doxycycline (VIBRAMYCIN) 100 MG capsule Take one cap by mouth every 12 hours. Take with food. 07/10/18   Kandra Nicolas, MD  EPINEPHrine 0.15 MG/0.15ML IJ injection Inject 0.15 mLs (0.15 mg total) into the skin as needed. Inject as directed. 03/13/17   Breeback, Jade L, PA-C  flunisolide (NASALIDE) 25 MCG/ACT (0.025%) SOLN Place 2 sprays into the nose 2 (two) times daily. 07/02/18   Breeback, Royetta Car, PA-C  folic acid (FOLVITE) 1 MG tablet Take 2 tablets (2 mg total) by mouth daily. 06/23/18   Bo Merino, MD  furosemide (LASIX) 40 MG tablet Take 40 mg by mouth daily as needed for edema (When she feels like she is swelling).    [provider]  glycopyrrolate (ROBINUL) 2 MG tablet Take 1 tablet (2 mg total) by mouth 2 (two) times daily. Patient taking differently: Take 2 mg by mouth 2 (two) times daily as needed.  10/15/17   Ladene Artist, MD  Golimumab (SIMPONI) 100 MG/ML SOAJ INJECT ONE DEVICE UNDER THE SKIN ONCE MONTHLY 05/07/18   Tresa Garter, MD  HYDROcodone-homatropine (HYCODAN) 5-1.5 MG/5ML syrup Take 5 mLs by mouth every 12 (twelve) hours. 07/07/18   Breeback, Royetta Car, PA-C  hydroxychloroquine (PLAQUENIL) 200 MG tablet Take 1 tablet by mouth twice daily. 06/23/18   Bo Merino, MD  insulin lispro (HUMALOG) 100 UNIT/ML injection FOR USE IN INSULIN PUMP. TOTAL DAILY INSULIN DOSE = UP TO 90 UNITS. 03/24/15   [provider]  isosorbide mononitrate (IMDUR) 60 MG 24 hr tablet Take 1 tablet (60 mg total) by mouth daily. 05/28/18   Daune Perch, NP  leflunomide (ARAVA) 20 MG tablet Take 1  tablet (20 mg total) by mouth daily. 06/23/18   Bo Merino, MD  losartan (COZAAR) 50 MG tablet Take 1 tablet (50 mg total) by mouth daily. 07/11/17   Sherren Mocha, MD  metoprolol tartrate (LOPRESSOR) 25 MG tablet Take 1 tablet (25 mg total) by mouth 2 (two) times daily. 07/11/17   Sherren Mocha, MD  montelukast (SINGULAIR) 10 MG tablet TAKE 1 TABLET BY MOUTH AT BEDTIME 06/02/18   Breeback, Jade L, PA-C  nabumetone (RELAFEN) 750 MG tablet TAKE 1 TABLET BY MOUTH TWICE DAILY 06/02/18   Bo Merino, MD  Omega-3 Fatty Acids (FISH OIL) 1200 MG CAPS Take 1,200 mg by mouth 2 (two) times daily.    [provider]  ONE TOUCH ULTRA TEST test strip  11/20/16   [provider]  pantoprazole (PROTONIX) 40 MG tablet Take 1 tablet (40 mg total) by mouth 2 (two) times daily. 10/15/17   Ladene Artist, MD  potassium chloride (K-DUR) 10 MEQ tablet Take 1 tablet (10 mEq total) by mouth daily. 07/11/17   Sherren Mocha, MD  predniSONE (DELTASONE) 20 MG tablet Take one tab by mouth twice daily for 4 days, then one daily. Take with food. 07/10/18   Kandra Nicolas, MD  ranitidine (ZANTAC) 300 MG capsule Take 1 capsule (300 mg total) by mouth every evening. 10/15/17   Ladene Artist, MD  rosuvastatin (CRESTOR) 10 MG tablet Take 1 tablet (10 mg total) by mouth daily. 07/11/17   Sherren Mocha, MD    Family History Family History  Problem Relation Age of Onset  . Colon polyps Father   . Diabetes Unknown   . Heart disease Unknown   . Hypertension Unknown   . Hyperlipidemia Unknown   . Depression Unknown   . Migraines Unknown   . Stroke Paternal Grandmother   . Heart attack Neg Hx   . Colon cancer Neg Hx   . Stomach cancer Neg Hx   . Esophageal cancer Neg Hx     Social History Social History   Tobacco Use  . Smoking status: Never Smoker  . Smokeless tobacco: Never Used  Substance Use Topics  . Alcohol use: Yes    Comment: rarely 1 every 6 months  . Drug use: No      Allergies   Prochlorperazine; Ramipril; Shellfish-derived products; Atorvastatin; Compazine  [prochlorperazine edisylate]; Etanercept; Infliximab; Iohexol; Orencia [abatacept]; Shellfish allergy; Tofacitinib; Prochlorperazine edisylate; Amiodarone; and Rituximab   Review of Systems Review of Systems + sore throat + cough No pleuritic pain, but feels tight in anterior chest. + wheezing + nasal congestion + post-nasal drainage No sinus pain/pressure No itchy/red eyes No earache No hemoptysis + SOB No fever, + chills/sweats No nausea No vomiting No abdominal pain No diarrhea No urinary symptoms No skin rash + fatigue ? myalgias + headache    Physical Exam Triage Vital Signs ED Triage Vitals  Enc Vitals Group     BP 07/10/18 1526 139/79     Pulse Rate 07/10/18 1526 71     Resp --      Temp 07/10/18 1526 97.8 F (36.6 C)     Temp Source 07/10/18 1526 Oral     SpO2 07/10/18 1526 98 %     Weight 07/10/18 1527 177 lb (80.3 kg)     Height 07/10/18 1527 5\' 3"  (1.6 m)     Head Circumference --      Peak Flow --      Pain Score 07/10/18 1527 0     Pain Loc --      Pain Edu? --      Excl. in [place]? --    No data found.  Updated Vital Signs BP 139/79 (BP Location: Right Arm)   Pulse 71   Temp 97.8 F (36.6 C) (Oral)   Ht 5\' 3"  (1.6 m)   Wt 177 lb (80.3 kg)   SpO2 98%   BMI 31.35 kg/m   Visual Acuity Right Eye Distance:   Left Eye Distance:   Bilateral Distance:    Right Eye Near:   Left Eye Near:    Bilateral Near:     Physical Exam Nursing notes and Vital Signs reviewed. Appearance:  Patient appears stated age, and in no acute distress.  She coughs frequently Eyes:  Pupils are equal,  round, and reactive to light and accomodation.  Extraocular movement is intact.  Conjunctivae are not inflamed  Ears:  Canals normal.  Tympanic membranes normal.  Nose:  Mildly congested turbinates.  No sinus tenderness.   Pharynx:  Normal Neck:  Supple.  Enlarged  posterior/lateral nodes are palpated bilaterally, tender to palpation on the left.   Lungs:  Few scattered bilateral expiratory wheezes heard.   Breath sounds are equal.  Moving air well. Heart:  Regular rate and rhythm without murmurs, rubs, or gallops.  Abdomen:  Nontender without masses or hepatosplenomegaly.  Bowel sounds are present.  No CVA or flank tenderness.  Extremities:  No edema.  Skin:  No rash present.    UC Treatments / Results  Labs (all labs ordered are listed, but only abnormal results are displayed) Labs Reviewed - No data to display  EKG None  Radiology Dg Chest 2 View  Result Date: 07/10/2018 CLINICAL DATA:  Cough for 5 days, congestion, increased wheezing EXAM: CHEST - 2 VIEW COMPARISON:  Chest x-ray of 04/25/2018 FINDINGS: No active infiltrate or effusion is seen. There is some prominence of central markings with mild peribronchial thickening and bronchitis is a consideration. Median sternotomy sutures are present from prior CABG and the heart is mildly enlarged. No bony abnormality is seen. IMPRESSION: 1. No pneumonia or effusion. 2. Cannot exclude bronchitis. Electronically Signed   By: Ivar Drape M.D.   On: 07/10/2018 16:01    Procedures Procedures (including critical care time)  Medications Ordered in UC Medications - No data to display  Initial Impression / Assessment and Plan / UC Course  I have reviewed the triage vital signs and the nursing notes.  Pertinent labs & imaging results that were available during my care of the patient were reviewed by me and considered in my medical decision making (see chart for details).    Suspect atypical organism.  Stop Augmentin and begin doxycycline. Begin prednisone burst/taper. Prescription written for Benzonatate [facility]) to take at bedtime for night-time cough.  Followup with Family Doctor if not improved in 7 to 10 days.   Final Clinical Impressions(s) / UC Diagnoses   Final diagnoses:  Acute  bronchitis, unspecified organism     Discharge Instructions     Take plain guaifenesin (1200mg  extended release tabs such as Mucinex) twice daily, with plenty of water, for cough and congestion.   Get adequate rest.   May take Delsym Cough Suppressant with Tessalon at bedtime for nighttime cough.  May continue Singulair, and albuterol inhaler. Stop all antihistamines for now, and other non-prescription cough/cold preparations.        ED Prescriptions    Medication Sig Dispense Auth. Provider   doxycycline (VIBRAMYCIN) 100 MG capsule Take one cap by mouth every 12 hours. Take with food. 20 capsule Kandra Nicolas, MD   predniSONE (DELTASONE) 20 MG tablet Take one tab by mouth twice daily for 4 days, then one daily. Take with food. 12 tablet Kandra Nicolas, MD   benzonatate (TESSALON) 200 MG capsule Take one cap by mouth at bedtime as needed for cough.  May repeat in 4 to 6 hours 15 capsule Assunta Found Ishmael Holter, MD        Kandra Nicolas, MD 07/10/18 [phone]

## 2018-07-10 NOTE — Telephone Encounter (Signed)
Pt called stating she had seen jade on Monday and is still not feeling better and wanted to know if we could see her in office today.   I advised pt that we did not have any more appts today and offered her an 8:30 appt tomorrow, pt decline.   Pt states she called an urgent care and was told she needed to be seen today, so she is going to go be seen at the urgent care that she had called located in [place].

## 2018-07-10 NOTE — Patient Instructions (Signed)
To follow up with [facility] for chest xray.    Cough, Adult A cough helps to clear your throat and lungs. A cough may last only 2-3 weeks (acute), or it may last longer than 8 weeks (chronic). Many different things can cause a cough. A cough may be a sign of an illness or another medical condition. Follow these instructions at home:  Pay attention to any changes in your cough.  Take medicines only as told by your doctor. ? If you were prescribed an antibiotic medicine, take it as told by your doctor. Do not stop taking it even if you start to feel better. ? Talk with your doctor before you try using a cough medicine.  Drink enough fluid to keep your pee (urine) clear or pale yellow.  If the air is dry, use a cold steam vaporizer or humidifier in your home.  Stay away from things that make you cough at work or at home.  If your cough is worse at night, try using extra pillows to raise your head up higher while you sleep.  Do not smoke, and try not to be around smoke. If you need help quitting, ask your doctor.  Do not have caffeine.  Do not drink alcohol.  Rest as needed. Contact a doctor if:  You have new problems (symptoms).  You cough up yellow fluid (pus).  Your cough does not get better after 2-3 weeks, or your cough gets worse.  Medicine does not help your cough and you are not sleeping well.  You have pain that gets worse or pain that is not helped with medicine.  You have a fever.  You are losing weight and you do not know why.  You have night sweats. Get help right away if:  You cough up blood.  You have trouble breathing.  Your heartbeat is very fast. This information is not intended to replace advice given to you by your health care provider. Make sure you discuss any questions you have with your health care provider. Document Released: 08/30/2011 Document Revised: 05/24/2016 Document Reviewed: 02/23/2015 Elsevier Interactive Patient Education  2018  [employer]. Acute Bronchitis, Adult Acute bronchitis is when air tubes (bronchi) in the lungs suddenly get swollen. The condition can make it hard to breathe. It can also cause these symptoms:  A cough.  Coughing up clear, yellow, or green mucus.  Wheezing.  Chest congestion.  Shortness of breath.  A fever.  Body aches.  Chills.  A sore throat.  Follow these instructions at home: Medicines  Take over-the-counter and prescription medicines only as told by your doctor.  If you were prescribed an antibiotic medicine, take it as told by your doctor. Do not stop taking the antibiotic even if you start to feel better. General instructions  Rest.  Drink enough fluids to keep your pee (urine) clear or pale yellow.  Avoid smoking and secondhand smoke. If you smoke and you need help quitting, ask your doctor. Quitting will help your lungs heal faster.  Use an inhaler, cool mist vaporizer, or humidifier as told by your doctor.  Keep all follow-up visits as told by your doctor. This is important. How is this prevented? To lower your risk of getting this condition again:  Wash your hands often with soap and water. If you cannot use soap and water, use hand sanitizer.  Avoid contact with people who have cold symptoms.  Try not to touch your hands to your mouth, nose, or eyes.  Make  sure to get the flu shot every year.  Contact a doctor if:  Your symptoms do not get better in 2 weeks. Get help right away if:  You cough up blood.  You have chest pain.  You have very bad shortness of breath.  You become dehydrated.  You faint (pass out) or keep feeling like you are going to pass out.  You keep throwing up (vomiting).  You have a very bad headache.  Your fever or chills gets worse. This information is not intended to replace advice given to you by your health care provider. Make sure you discuss any questions you have with your health care provider. Document  Released: 06/04/2008 Document Revised: 07/25/2016 Document Reviewed: 06/06/2016 Elsevier Interactive Patient Education  [employer].

## 2018-07-10 NOTE — Discharge Instructions (Addendum)
Take plain guaifenesin (1200mg  extended release tabs such as Mucinex) twice daily, with plenty of water, for cough and congestion.   Get adequate rest.   May take Delsym Cough Suppressant with Tessalon at bedtime for nighttime cough.  May continue Singulair, and albuterol inhaler. Stop all antihistamines for now, and other non-prescription cough/cold preparations.

## 2018-07-10 NOTE — ED Triage Notes (Signed)
Has had a cough since last Friday.  Saw Jade on Monday, and prescribed Augmentin and cough medicine.  Is not feeling any better.

## 2018-07-10 NOTE — Progress Notes (Signed)
Subjective:    Patient ID: [name], female    DOB: [birth year], 52 y.o.   MRN: [id number]  HPI 52 yo female in non acute distress.    Seen  By her primary doctor on  07/07/2018 treated for Sinusitis and cough treated with Augmentin and Hycodan cough syrup.  Today comes in  chest congestion and cough. Productive sputum yellow tinged. And has had 8 doses of Augmentin. Coughing, tried the Hycodan and  2 nights ago vomited 2 times. "Ladona Ridgel do not work for me".  Coughing at night and the cough would make her sit up and the movement caused her to get sick.  Had tried less last night and was able to sleep with no vomiting. Does not feel cough is worse than when seen on Monday. " I am going on vacation on Saturday and I was hoping to get some steroids", [place]  Complains of fever and chills last night, occasional wheeze, but not all the time. Shortness of breath with unable to stop coughing.  "I took Monday off but they needed me at work the rest of the week."    Review of Systems  Constitutional: Positive for appetite change (decreased), chills (last night), diaphoresis, fatigue and fever (last night).  HENT: Positive for congestion, ear pain (right ear pain yesterday after popping), postnasal drip, rhinorrhea and sinus pressure. Negative for ear discharge, facial swelling (maxillary but gotten better), sneezing, sore throat, tinnitus and trouble swallowing.   Eyes: Negative for discharge, itching and visual disturbance.  Respiratory: Positive for cough, shortness of breath (with coughing and when she cannot stop coughing) and wheezing (occasional with a deep breath). Negative for chest tightness.   Cardiovascular: Positive for chest pain ("just from coughing"). Negative for palpitations and leg swelling.  Gastrointestinal: Positive for diarrhea (4 times yesterday " I also have IBS") and vomiting (2 nights ago only). Negative for abdominal pain.  Endocrine:  Negative for polydipsia, polyphagia and polyuria.  Genitourinary: Negative for dysuria and hematuria.  Musculoskeletal: Negative for myalgias.  Skin: Negative for rash.  Allergic/Immunologic: Positive for environmental allergies and food allergies (shellfish and strawberries).  Neurological: Negative for dizziness, syncope, light-headedness and headaches (above eyes and around eyes).  Hematological: Negative for adenopathy.  Psychiatric/Behavioral: Negative for behavioral problems, confusion, self-injury and suicidal ideas. The patient is not nervous/anxious.    Blood work yesterday for  Thyroid and A1C yesterday.  Saw endocrinologist yesterday, "he says I was a model patient 6.4 A1C"    Objective:   Physical Exam  Constitutional: She is oriented to person, place, and time. She appears well-developed and well-nourished.  HENT:  Head: Normocephalic and atraumatic.  Right Ear: Hearing, external ear and ear canal normal. A middle ear effusion is present.  Left Ear: Hearing, external ear and ear canal normal. A middle ear effusion is present.  Nose: Nose normal.  Mouth/Throat: Uvula is midline, oropharynx is clear and moist and mucous membranes are normal. Tonsils are 1+ on the right. Tonsils are 1+ on the left.  Eyes: Pupils are equal, round, and reactive to light. Conjunctivae and EOM are normal.  Neck: Normal range of motion. Neck supple.  Cardiovascular: Normal rate, regular rhythm and normal heart sounds.  Pulmonary/Chest: Effort normal and breath sounds normal.  Lymphadenopathy:    She has no cervical adenopathy.  Neurological: She is alert and oriented to person, place, and time.  Skin: Skin is warm and dry.  Psychiatric: She  has a normal mood and affect. Her behavior is normal. Judgment and thought content normal.  Nursing note and vitals reviewed.   Tight sounding on the upper right side   A lot of coughing in room     Assessment & Plan:  Bronchitis possible   Pneumonia Cough, with fever and chills yesterday.  Recommended patient to be seen by [facility] for chest xray or other facility that can do x-rays and possible breathing treatment. Patient agrees and verbalizes understanding and has no questions at discharge.

## 2018-07-15 ENCOUNTER — Telehealth: Payer: Self-pay | Admitting: *Deleted

## 2018-07-15 MED ORDER — LEVOFLOXACIN 500 MG PO TABS: 500.0000 mg | ORAL_TABLET | Freq: Every day | ORAL | 0 refills | Status: AC

## 2018-07-15 MED FILL — levoFLOXacin 500 MG TABS: 500 | 5 days supply | Qty: 5 | Fill #0

## 2018-07-15 NOTE — Telephone Encounter (Signed)
LM for pt per Dr Assunta Found stop Doxycycline and start Levaquin. Sent rx to pharmacy.

## 2018-07-15 NOTE — Telephone Encounter (Signed)
Pt called reports that she is no better after her visit last week. Please advise.

## 2018-07-21 ENCOUNTER — Other Ambulatory Visit: Payer: Self-pay | Admitting: Cardiovascular Disease

## 2018-07-21 NOTE — Telephone Encounter (Signed)
Pt's pharmacy is requesting a refill on pantoprazole. Would Dr. Cooper like to refill this medication? Please address 

## 2018-07-22 MED FILL — PANTOPRAZOLE SOD DR 40 MG T: 40 | 30 days supply | Qty: 60 | Fill #0

## 2018-07-31 ENCOUNTER — Other Ambulatory Visit: Payer: Self-pay

## 2018-07-31 ENCOUNTER — Telehealth: Payer: Self-pay

## 2018-07-31 DIAGNOSIS — Z79899 Other long term (current) drug therapy: Secondary | ICD-10-CM

## 2018-07-31 MED ORDER — GOLIMUMAB 100 MG/ML ~~LOC~~ SOAJ: SUBCUTANEOUS | 0 refills | Status: DC

## 2018-07-31 NOTE — Telephone Encounter (Signed)
Last visit: 06/23/2018 Next visit: 11/17/2018 Labs: 04/25/2018  Tb Gold: 08/02/2017 negative   Advised patient she is due to update labs.   Okay to refill per Dr. Estanislado Pandy.

## 2018-08-01 ENCOUNTER — Telehealth: Payer: Self-pay | Admitting: Pharmacy Technician

## 2018-08-01 ENCOUNTER — Other Ambulatory Visit: Payer: Self-pay | Admitting: Pharmacist

## 2018-08-01 MED ORDER — GOLIMUMAB 100 MG/ML ~~LOC~~ SOAJ: SUBCUTANEOUS | 0 refills | Status: DC

## 2018-08-01 MED FILL — SIMPONI 100 MG/ML SOAJ: 100 | 30 days supply | Qty: 1 | Fill #0

## 2018-08-01 NOTE — Telephone Encounter (Signed)
Received PA request from [facility] for [place]. Per notes on file, current PA is still active until 01/22/19. [employer] plan, and confirmed PA is still active. They were able to see that the pharmacy was running the claim incorrectly (3 for 30 days). Called pharmacy, spoke to [place], she was able to get a paid claim by running 1 for 30 days.  8:37 AM Beatriz Chancellor, CPhT

## 2018-08-02 LAB — COMPLETE METABOLIC PANEL WITH GFR
AG RATIO: 2.1 (calc) (ref 1.0–2.5)
ALBUMIN MSPROF: 4.4 g/dL (ref 3.6–5.1)
ALT: 22 U/L (ref 6–29)
AST: 21 U/L (ref 10–35)
Alkaline phosphatase (APISO): 70 U/L (ref 33–130)
BILIRUBIN TOTAL: 0.4 mg/dL (ref 0.2–1.2)
BUN: 8 mg/dL (ref 7–25)
CO2: 24 mmol/L (ref 20–32)
Calcium: 9.2 mg/dL (ref 8.6–10.4)
Chloride: 101 mmol/L (ref 98–110)
Creat: 0.83 mg/dL (ref 0.50–1.05)
GFR, EST AFRICAN AMERICAN: 95 mL/min/{1.73_m2} (ref >=60)
GFR, Est Non African American: 82 mL/min/{1.73_m2} (ref >=60)
Globulin: 2.1 g/dL (calc) (ref 1.9–3.7)
Glucose, Bld: 113 mg/dL — ABNORMAL HIGH (ref 65–99)
POTASSIUM: 3.9 mmol/L (ref 3.5–5.3)
Sodium: 137 mmol/L (ref 135–146)
TOTAL PROTEIN: 6.5 g/dL (ref 6.1–8.1)

## 2018-08-02 LAB — CBC WITH DIFFERENTIAL/PLATELET
BASOS ABS: 52 {cells}/uL (ref 0–200)
Basophils Relative: 1 %
EOS PCT: 4 %
Eosinophils Absolute: 208 cells/uL (ref 15–500)
HEMATOCRIT: 36.1 % (ref 35.0–45.0)
HEMOGLOBIN: 12.4 g/dL (ref 11.7–15.5)
Lymphs Abs: 2096 cells/uL (ref 850–3900)
MCH: 29.6 pg (ref 27.0–33.0)
MCHC: 34.3 g/dL (ref 32.0–36.0)
MCV: 86.2 fL (ref 80.0–100.0)
MPV: 9.6 fL (ref 7.5–12.5)
Monocytes Relative: 8 %
NEUTROS ABS: 2428 {cells}/uL (ref 1500–7800)
NEUTROS PCT: 46.7 %
Platelets: 185 10*3/uL (ref 140–400)
RBC: 4.19 10*6/uL (ref 3.80–5.10)
RDW: 12.2 % (ref 11.0–15.0)
Total Lymphocyte: 40.3 %
WBC: 5.2 10*3/uL (ref 3.8–10.8)
WBCMIX: 416 {cells}/uL (ref 200–950)

## 2018-08-02 LAB — QUANTIFERON-TB GOLD PLUS
Mitogen-NIL: >10 IU/mL
NIL: 0.03 IU/mL
QUANTIFERON-TB GOLD PLUS: NEGATIVE
TB2-NIL: 0 IU/mL

## 2018-08-07 ENCOUNTER — Other Ambulatory Visit: Payer: Self-pay | Admitting: Cardiovascular Disease

## 2018-08-07 MED FILL — raNITIdine HCL 300 MG TABS: 300 | 30 days supply | Qty: 30 | Fill #9

## 2018-08-07 MED FILL — MONTELUKAST SOD 10 MG TAB: 10 | 30 days supply | Qty: 30 | Fill #2

## 2018-08-07 MED FILL — CYCLOBENZAPRINE HCL 10 MG T: 10 | 30 days supply | Qty: 30 | Fill #5

## 2018-08-07 MED FILL — ROSUVASTATIN CALCIUM 10 MG: 10 | 90 days supply | Qty: 90 | Fill #0

## 2018-08-07 MED FILL — LEFLUNOMIDE 20 MG TABLET: 20 | 30 days supply | Qty: 30 | Fill #0

## 2018-08-07 MED FILL — ISOSORBIDE MN ER 60 MG TAB: 60 | 30 days supply | Qty: 30 | Fill #2

## 2018-08-07 MED FILL — NABUMETONE 750 MG TABLET: 750 | 30 days supply | Qty: 60 | Fill #2

## 2018-08-13 ENCOUNTER — Encounter: Payer: Self-pay | Admitting: Emergency Medicine

## 2018-08-13 ENCOUNTER — Emergency Department (INDEPENDENT_AMBULATORY_CARE_PROVIDER_SITE_OTHER): Payer: 59

## 2018-08-13 ENCOUNTER — Emergency Department
Admission: EM | Admit: 2018-08-13 | Discharge: 2018-08-13 | Disposition: A | Discharge status: Home / Self Care | Payer: 59 | Source: Home / Self Care | Attending: Family Medicine | Admitting: Family Medicine

## 2018-08-13 ENCOUNTER — Other Ambulatory Visit: Payer: Self-pay

## 2018-08-13 DIAGNOSIS — M79672 Pain in left foot: Secondary | ICD-10-CM

## 2018-08-13 DIAGNOSIS — R21 Rash and other nonspecific skin eruption: Secondary | ICD-10-CM

## 2018-08-13 MED ORDER — CETIRIZINE HCL 10 MG PO TABS: 10.0000 mg | ORAL_TABLET | Freq: Every day | ORAL | 0 refills | Status: DC

## 2018-08-13 NOTE — ED Provider Notes (Signed)
[name]    CSN: [id number] Arrival date & time: 08/13/18  1644     History   Chief Complaint Chief Complaint  Patient presents with  . Rash  . Foot Pain    HPI [name] is a 52 y.o. female.   HPI  [name] is a 52 y.o. female presenting to UC with 2 separate concerns.   Rash: she noticed a mildly itchy red rash on her Left underarm/shoulder area 2 days ago along with a red rash on her Right forearm and Left thigh.  The rash appears to be small red bumps. Non-tender. Hx of eczema but it usually is localized. No new soaps, lotions or medications. Mild relief with previously prescribed triamcinolone cream. She does have a cat that went outdoors the other day but the cat is primarily an indoor cat. Pt has not been in area of potential poison ivy or other plants that could have caused the rash.  Left foot pain: she jumped on her daughter's bed about 2-3 weeks ago. She had immediate pain that resolved within a few minutes. Pain was gradually improving but has started to worsen again. Pain is aching and sore. 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    Past Medical History:  Diagnosis Date  . ACUT MI ANTEROLAT WALL SUBSQT EPIS CARE   . Acute maxillary sinusitis   . ALLERGIC RHINITIS, SEASONAL   . ARTHRITIS, RHEUMATOID   . Atrial fibrillation ([place])    a. after CABG.  . Back pain   . CAD, ARTERY BYPASS GRAFT    a. DES to RCA in 2010 then LAD occlusion s/p CABG 3 06/07/2009 with LIMA to LAD, reverse SVG to D1, reverse SVG to distal RCA. b. Cath 05/08/2016 slightly hypodense region in the intermediate branch, however she had excellent flow, FFR was normal. Vein graft to PDA and the posterolateral branch is patent, patent LIMA to LAD, occluded SVG to diagonal.  . CARPAL TUNNEL SYNDROME, BILATERAL   . CHOLELITHIASIS   . Contrast media allergy   . DERMATITIS, ALLERGIC   . DIABETES MELLITUS, TYPE I      on insulin pump  . DIABETIC  RETINOPATHY   . Hiatal hernia   . HYPERLIPIDEMIA-MIXED   . HYPERTHYROIDISM   . Insulin pump in place   . MIGRAINE W/O AURA W/INTRACT W/STATUS MIGRAINOSUS 02/19/2008  . SINUS TACHYCARDIA 11/08/2010  . TRIGGER FINGER   . URI     Patient Active Problem List   Diagnosis Date Noted  . Cough 06/26/2018  . PND (post-nasal drip) 06/26/2018  . Vitamin D deficiency 07/04/2017  . B12 deficiency 07/04/2017  . Abnormal CT scan 06/19/2017  . Gastroesophageal reflux disease 06/19/2017  . Antiplatelet or antithrombotic long-term use 06/19/2017  . Sinus pressure 03/15/2017  . Nasal congestion 03/15/2017  . Graves disease 02/01/2017  . Sleep difficulties 02/01/2017  . No energy 02/01/2017  . Osteopenia 02/01/2017  . Hypertension, essential 05/08/2016  . IDDM (insulin dependent diabetes mellitus) ([place]) 05/08/2016  . Coronary artery disease involving native coronary artery of native heart with angina pectoris ([place])   . HYPERTHYROIDISM 11/09/2010  . SINUS TACHYCARDIA 11/08/2010  . SHORTNESS OF BREATH 11/08/2010  . DERMATITIS, ALLERGIC 07/20/2010  . EDEMA 07/12/2010  . DIZZINESS 11/14/2009  . ATRIAL FIBRILLATION 07/20/2009  . CAD, ARTERY BYPASS GRAFT 06/07/2009  . ANGINA, STABLE/EXERTIONAL 06/02/2009  . HYPERLIPIDEMIA-MIXED 04/26/2009  .  ACUT MI ANTEROLAT WALL SUBSQT EPIS CARE 04/26/2009  . Chest pain 04/26/2009  . DIABETIC  RETINOPATHY 04/25/2009  . CARPAL TUNNEL SYNDROME, BILATERAL 04/25/2009  . TRIGGER FINGER 04/25/2009  . MIGRAINE W/O AURA W/INTRACT W/STATUS MIGRAINOSUS 02/19/2008  . ALLERGIC RHINITIS, SEASONAL 10/16/2006  . CHOLELITHIASIS 10/16/2006  . Rheumatoid arthritis ([place]) 10/16/2006    Past Surgical History:  Procedure Laterality Date  . ABDOMINAL HYSTERECTOMY    . Caesarean section    . CARDIAC CATHETERIZATION N/A 05/08/2016   Procedure: Left Heart Cath and Cors/Grafts Angiography;  Surgeon: Burnell Blanks, MD;  Location: [facility];  Service: Cardiovascular;  Laterality: N/A;  . CARPAL TUNNEL RELEASE    . CHOLECYSTECTOMY    . CORONARY ARTERY BYPASS GRAFT    . VITRECTOMY      OB History   None      Home Medications    Prior to Admission medications   Medication Sig Start Date End Date Taking? Authorizing Provider  aspirin 81 MG tablet Take 81 mg by mouth daily.     [provider]  Beclomethasone Dipropionate (QNASL) 80 MCG/ACT AERS Place 2 sprays into both nostrils daily. 06/25/18   Breeback, Jade L, PA-C  benzonatate (TESSALON) 200 MG capsule Take one cap by mouth at bedtime as needed for cough.  May repeat in 4 to 6 hours 07/10/18   Kandra Nicolas, MD  cetirizine (ZYRTEC) 10 MG tablet Take 1 tablet (10 mg total) by mouth daily. 08/13/18   Noe Gens, PA-C  clopidogrel (PLAVIX) 75 MG tablet Take 1 tablet (75 mg total) by mouth daily. 07/11/17   Sherren Mocha, MD  cyclobenzaprine (FLEXERIL) 10 MG tablet Take 1 tablet (10 mg total) by mouth at bedtime. 06/23/18   Bo Merino, MD  doxycycline (VIBRAMYCIN) 100 MG capsule Take one cap by mouth every 12 hours. Take with food. 07/10/18   Kandra Nicolas, MD  EPINEPHrine 0.15 MG/0.15ML IJ injection Inject 0.15 mLs (0.15 mg total) into the skin as needed. Inject as directed. 03/13/17   Breeback, Jade L, PA-C  flunisolide (NASALIDE) 25 MCG/ACT (0.025%) SOLN Place 2 sprays into the nose 2 (two) times daily. 07/02/18   Breeback, Royetta Car, PA-C  folic acid (FOLVITE) 1 MG tablet Take 2 tablets (2 mg total) by mouth daily. 06/23/18   Bo Merino, MD  furosemide (LASIX) 40 MG tablet Take 40 mg by mouth daily as needed for edema (When she feels like she is swelling).    [provider]  glycopyrrolate (ROBINUL) 2 MG tablet Take 1 tablet (2 mg total) by mouth 2 (two) times daily. Patient taking differently: Take 2 mg by mouth 2 (two) times daily as needed.  10/15/17   Ladene Artist, MD  Golimumab (SIMPONI) 100 MG/ML SOAJ INJECT ONE DEVICE UNDER  THE SKIN ONCE MONTHLY 08/01/18   Tresa Garter, MD  HYDROcodone-homatropine (HYCODAN) 5-1.5 MG/5ML syrup Take 5 mLs by mouth every 12 (twelve) hours. 07/07/18   Breeback, Royetta Car, PA-C  hydroxychloroquine (PLAQUENIL) 200 MG tablet Take 1 tablet by mouth twice daily. 06/23/18   Bo Merino, MD  insulin lispro (HUMALOG) 100 UNIT/ML injection FOR USE IN INSULIN PUMP. TOTAL DAILY INSULIN DOSE = UP TO 90 UNITS. 03/24/15   [provider]  isosorbide mononitrate (IMDUR) 60 MG 24 hr tablet Take 1 tablet (60 mg total) by mouth daily. 05/28/18   Daune Perch, NP  leflunomide (ARAVA) 20 MG tablet Take 1 tablet (20 mg total) by mouth  daily. 06/23/18   Bo Merino, MD  losartan (COZAAR) 50 MG tablet Take 1 tablet (50 mg total) by mouth daily. 07/11/17   Sherren Mocha, MD  metoprolol tartrate (LOPRESSOR) 25 MG tablet Take 1 tablet (25 mg total) by mouth 2 (two) times daily. 07/11/17   Sherren Mocha, MD  montelukast (SINGULAIR) 10 MG tablet TAKE 1 TABLET BY MOUTH AT BEDTIME 06/02/18   Breeback, Jade L, PA-C  nabumetone (RELAFEN) 750 MG tablet TAKE 1 TABLET BY MOUTH TWICE DAILY 06/02/18   Bo Merino, MD  Omega-3 Fatty Acids (FISH OIL) 1200 MG CAPS Take 1,200 mg by mouth 2 (two) times daily.    [provider]  ONE TOUCH ULTRA TEST test strip  11/20/16   [provider]  pantoprazole (PROTONIX) 40 MG tablet Take 1 tablet (40 mg total) by mouth 2 (two) times daily. 10/15/17   Ladene Artist, MD  potassium chloride (K-DUR) 10 MEQ tablet Take 1 tablet (10 mEq total) by mouth daily. 07/11/17   Sherren Mocha, MD  predniSONE (DELTASONE) 20 MG tablet Take one tab by mouth twice daily for 4 days, then one daily. Take with food. 07/10/18   Kandra Nicolas, MD  ranitidine (ZANTAC) 300 MG capsule Take 1 capsule (300 mg total) by mouth every evening. 10/15/17   Ladene Artist, MD  rosuvastatin (CRESTOR) 10 MG tablet TAKE 1 TABLET (10 MG TOTAL) BY MOUTH DAILY. 08/07/18   Sherren Mocha, MD    Family History Family History  Problem Relation Age of Onset  . Colon polyps Father   . Diabetes Unknown   . Heart disease Unknown   . Hypertension Unknown   . Hyperlipidemia Unknown   . Depression Unknown   . Migraines Unknown   . Stroke Paternal Grandmother   . Heart attack Neg Hx   . Colon cancer Neg Hx   . Stomach cancer Neg Hx   . Esophageal cancer Neg Hx     Social History Social History   Tobacco Use  . Smoking status: Never Smoker  . Smokeless tobacco: Never Used  Substance Use Topics  . Alcohol use: Yes    Comment: rarely 1 every 6 months  . Drug use: No     Allergies   Prochlorperazine; Ramipril; Shellfish-derived products; Atorvastatin; Compazine  [prochlorperazine edisylate]; Etanercept; Infliximab; Iohexol; Orencia [abatacept]; Shellfish allergy; Tofacitinib; Prochlorperazine edisylate; Amiodarone; and Rituximab   Review of Systems Review of Systems  Constitutional: Negative for chills and fever.  Musculoskeletal: Positive for arthralgias and joint swelling. Negative for myalgias.  Skin: Positive for rash. Negative for color change and wound.  Neurological: Negative for weakness and numbness.     Physical Exam Triage Vital Signs ED Triage Vitals [08/13/18 1704]  Enc Vitals Group     BP 120/74     Pulse Rate 88     Resp 16     Temp 98.6 F (37 C)     Temp Source Oral     SpO2 96 %     Weight      Height      Head Circumference      Peak Flow      Pain Score 5     Pain Loc      Pain Edu?      Excl. in [place]?    No data found.  Updated Vital Signs BP 120/74 (BP Location: Right Arm)   Pulse 88   Temp 98.6 F (37 C) (Oral)   Resp 16  SpO2 96%   Visual Acuity Right Eye Distance:   Left Eye Distance:   Bilateral Distance:    Right Eye Near:   Left Eye Near:    Bilateral Near:     Physical Exam  Constitutional: She is oriented to person, place, and time. She appears well-developed and well-nourished.  HENT:    Head: Normocephalic and atraumatic.  Eyes: EOM are normal.  Neck: Normal range of motion.  Cardiovascular: Normal rate.  Pulmonary/Chest: Effort normal.  Musculoskeletal: Normal range of motion. She exhibits edema and tenderness.  Left foot: mild edema to medial aspect, tenderness to proximal medial foot. Full ROM, no crepitus.  No tenderness to toes or ankle.   Neurological: She is alert and oriented to person, place, and time.  Skin: Skin is warm and dry. Rash noted.     Left axilla: 2-3xm area of dried erythematous maculopapular rash. Non-tender. No bleeding or drainage.  Left anterior thigh: multiple erythematous dried papules. Non-tender. Areas of excoriation.  Right forearm: three 2-32mm erythematous papules. Non-tender  Psychiatric: She has a normal mood and affect. Her behavior is normal.  Nursing note and vitals reviewed.    UC Treatments / Results  Labs (all labs ordered are listed, but only abnormal results are displayed) Labs Reviewed - No data to display  EKG None  Radiology Dg Foot Complete Left  Result Date: 08/13/2018 CLINICAL DATA:  Lateral foot pain after feeling a pop 3 weeks ago. EXAM: LEFT FOOT - COMPLETE 3+ VIEW COMPARISON:  01/01/2017 FINDINGS: Small calcaneal spur. No acute fracture or dislocation. No periosteal reaction or callus deposition. IMPRESSION: No acute osseous abnormality. Electronically Signed   By: [name] Miyamoto M.D.   On: 08/13/2018 17:38    Procedures Procedures (including critical care time)  Medications Ordered in UC Medications - No data to display  Initial Impression / Assessment and Plan / UC Course  I have reviewed the triage vital signs and the nursing notes.  Pertinent labs & imaging results that were available during my care of the patient were reviewed by me and considered in my medical decision making (see chart for details).     Non-discript rash w/o evidence of secondary infection Encouraged to continue triamcinolone.  May try cetirizine  Discussed imaging of Left foot. Home care instructions provided. Encouraged f/u with Sports Medicine.   Final Clinical Impressions(s) / UC Diagnoses   Final diagnoses:  Rash and nonspecific skin eruption  Left foot pain     Discharge Instructions      You may continue to apply your previously prescribed triamcinolone cream along with today's prescribed cetirizine (Zyrtec) for the itchy red rash. Try to keep the rash clean with warm water and mild soap to help prevent secondary infection. Please follow up with family medicine if not improving in 1 week, sooner if significantly worsening.  Please call to schedule a follow up appointment with sports medicine next week if your pain is not improving. You may benefit from a customized shoe insert.     ED Prescriptions    Medication Sig Dispense Auth. Provider   cetirizine (ZYRTEC) 10 MG tablet Take 1 tablet (10 mg total) by mouth daily. 30 tablet Noe Gens, PA-C     Controlled Substance Prescriptions [place] Controlled Substance Registry consulted? Not Applicable   Tyrell Antonio 08/13/18 1925

## 2018-08-13 NOTE — Discharge Instructions (Signed)
°  You may continue to apply your previously prescribed triamcinolone cream along with today's prescribed cetirizine (Zyrtec) for the itchy red rash. Try to keep the rash clean with warm water and mild soap to help prevent secondary infection. Please follow up with family medicine if not improving in 1 week, sooner if significantly worsening.  Please call to schedule a follow up appointment with sports medicine next week if your pain is not improving. You may benefit from a customized shoe insert.

## 2018-08-13 NOTE — ED Triage Notes (Signed)
Patient has 2 areas of pain on left foot that began 2-3 weeks ago after strenuous activity; and a rash on left shoulder, left leg and abdomen that started 2 days ago. No additional OTCs.

## 2018-08-14 ENCOUNTER — Telehealth: Payer: Self-pay

## 2018-08-14 ENCOUNTER — Ambulatory Visit (INDEPENDENT_AMBULATORY_CARE_PROVIDER_SITE_OTHER): Payer: 59 | Admitting: Family Medicine

## 2018-08-14 ENCOUNTER — Encounter: Payer: Self-pay | Admitting: Family Medicine

## 2018-08-14 VITALS — BP 139/65 | HR 70 | Ht 63.0 in | Wt 176.0 lb

## 2018-08-14 DIAGNOSIS — L404 Guttate psoriasis: Secondary | ICD-10-CM

## 2018-08-14 DIAGNOSIS — S93692A Other sprain of left foot, initial encounter: Secondary | ICD-10-CM

## 2018-08-14 DIAGNOSIS — B029 Zoster without complications: Secondary | ICD-10-CM | POA: Diagnosis not present

## 2018-08-14 MED ORDER — CLOBETASOL PROPIONATE 0.05 % EX CREA: 1.0000 "application " | TOPICAL_CREAM | Freq: Two times a day (BID) | CUTANEOUS | 2 refills | Status: DC

## 2018-08-14 MED ORDER — VALACYCLOVIR HCL 1 G PO TABS: 1000.0000 mg | ORAL_TABLET | Freq: Three times a day (TID) | ORAL | 2 refills | Status: DC

## 2018-08-14 NOTE — Patient Instructions (Signed)
Thank you for coming in today. I think the rash in the armpit is likely shingles.  Take valtrex 3x daily for 1 week.  I think the other rashes may be outbreak of psoriasis ([place])  The foot pain is likely a partial tear of the plantar fascia.  Use the cam walker boot as needed.  Ice  Recheck in 2 weeks  Let me know sooner if not doing well.    Shingles Shingles is an infection that causes a painful skin rash and fluid-filled blisters. Shingles is caused by the same virus that causes chickenpox. Shingles only develops in people who:  Have had chickenpox.  Have gotten the chickenpox vaccine. (This is rare.)  The first symptoms of shingles may be itching, tingling, or pain in an area on your skin. A rash will follow in a few days or weeks. The rash is usually on one side of the body in a bandlike or beltlike pattern. Over time, the rash turns into fluid-filled blisters that break open, scab over, and dry up. Medicines may:  Help you manage pain.  Help you recover more quickly.  Help to prevent long-term problems.  Follow these instructions at home: Medicines  Take medicines only as told by your doctor.  Apply an anti-itch or numbing cream to the affected area as told by your doctor. Blister and Rash Care  Take a cool bath or put cool compresses on the area of the rash or blisters as told by your doctor. This may help with pain and itching.  Keep your rash covered with a loose bandage (dressing). Wear loose-fitting clothing.  Keep your rash and blisters clean with mild soap and cool water or as told by your doctor.  Check your rash every day for signs of infection. These include redness, swelling, and pain that lasts or gets worse.  Do not pick your blisters.  Do not scratch your rash. General instructions  Rest as told by your doctor.  Keep all follow-up visits as told by your doctor. This is important.  Until your blisters scab over, your infection can cause  chickenpox in people who have never had it or been vaccinated against it. To prevent this from happening, avoid touching other people or being around other people, especially: ? Babies. ? Pregnant women. ? Children who have eczema. ? Elderly people who have transplants. ? People who have chronic illnesses, such as leukemia or AIDS. Contact a doctor if:  Your pain does not get better with medicine.  Your pain does not get better after the rash heals.  Your rash looks infected. Signs of infection include: ? Redness. ? Swelling. ? Pain that lasts or gets worse. Get help right away if:  The rash is on your face or nose.  You have pain in your face, pain around your eye area, or loss of feeling on one side of your face.  You have ear pain or you have ringing in your ear.  You have loss of taste.  Your condition gets worse. This information is not intended to replace advice given to you by your health care provider. Make sure you discuss any questions you have with your health care provider. Document Released: 06/04/2008 Document Revised: 08/12/2016 Document Reviewed: 09/28/2014 Elsevier Interactive Patient Education  [employer].

## 2018-08-14 NOTE — Telephone Encounter (Signed)
Thank you Dr Georgina Snell!  I called pt and she will come in around 2:30.

## 2018-08-14 NOTE — Telephone Encounter (Signed)
Pt was seen last night at urgent care for several issues. She has two rashes going on, one seems to be all over her body. Presents as little red bumps, no irritation nor SX.  Second rash seems to be on back of left arm, close to armpit. 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  Thirdly, pt is having some foot issues. Left foot "popped" when she went to [place] about 3 weeks ago and no improvement. Urgent Care recommended she see Dr Georgina Snell.  There are no openings today and pt states there is some frustration because she called earlier this week and there were no openings so she had to spend a lot of money on urgent care co-pay.   Dr Georgina Snell- do you think this is something that could be worked in? Pt available today after 2:00. Please advise

## 2018-08-14 NOTE — Telephone Encounter (Signed)
Have patient come in today right after lunch at 2:00 and we will work her in.

## 2018-08-15 NOTE — Progress Notes (Signed)
[name] is a 52 y.o. female who presents to [facility]: [facility] today for foot pain and rash.  [name] notes a 2-day history of a painful and somewhat itchy rash in her left axilla associated with a non-itchy nonpainful scattered rash across her trunk and extremities.  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  She was seen in urgent care and recommended trying triamcinolone cream.  She is been trying triamcinolone cream on the rash in the axilla which has not helped.  Additionally she notes small red dots across her arms which are not very itchy and not very bothersome but she thinks is associated with the rash and axilla.  She denies any new soaps detergents shampoos.  No fevers chills nausea vomiting or diarrhea.  Additionally she notes pain in her left heel and foot.  This is started about 2 to 3 weeks ago.  She was on her daughter's bed when she felt a pulling sensation in her medial plantar foot.  She has pain along the plantar calcaneus and into the medial midfoot.  She has pain is worse with activity and she is limping.  No radiating pain weakness or numbness fevers or chills.   ROS as above:  Exam:  BP 139/65   Pulse 70   Ht 5\' 3"  (1.6 m)   Wt 176 lb (79.8 kg)   BMI 31.18 kg/m  Gen: Well NAD HEENT: EOMI,  MMM Lungs: Normal work of breathing. CTABL Heart: RRR no MRG Abd: NABS, Soft. Nondistended, Nontender Exts: Brisk capillary refill, warm and well perfused.  Skin: Linear group of erythematous raised areas with some vesicles or crusted lesions left axilla area.  Additionally small macular pinpoint dots of erythema on extremities.  These are nontender and blanchable. MSK: Left foot slightly swollen along the medial midfoot.  No ecchymosis noted. Normal foot and ankle motion. Tender palpation medial plantar  calcaneus. Pain with resisted foot eversion. Antalgic gait present.      Lab and Radiology Results No results found for this or any previous visit (from the past 72 hour(s)). Dg Foot Complete Left  Result Date: 08/13/2018 CLINICAL DATA:  Lateral foot pain after feeling a pop 3 weeks ago. EXAM: LEFT FOOT - COMPLETE 3+ VIEW COMPARISON:  01/01/2017 FINDINGS: Small calcaneal spur. No acute fracture or dislocation. No periosteal reaction or callus deposition. IMPRESSION: No acute osseous abnormality. Electronically Signed   By: [name] Miyamoto M.D.   On: 08/13/2018 17:38  I personally (independently) visualized and performed the interpretation of the images attached in this note.     Assessment and Plan: 52 y.o. female with  Left foot pain: Likely partial tear of the plantar fascia or spring ligament injury.  Plan for relative rest with a Cam walker boot.  Will use ice massage as well.  Recheck in about 2 weeks.  Rash: Rash left axilla I believe shingles.  Plan for treatment with Valtrex.  Patient has a history of psoriasis and I believe the small erythematous macular dots across her extremities may be early reactive psoriasis.  It is reasonable to proceed with strep sonogram for the small dots scattered across her extremities as this may be helpful.  Additionally will use clobetasol cream for the hot spots.  Recheck in 2 weeks.    No orders of the defined types were placed in this encounter.  Meds ordered this encounter  Medications  . valACYclovir (VALTREX) 1000 MG tablet    Sig: Take 1 tablet (1,000 mg total) by mouth 3 (three) times daily.    Dispense:  21 tablet    Refill:  2  . clobetasol cream (TEMOVATE) 0.05 %    Sig: Apply 1 application topically 2 (two) times daily.    Dispense:  60 g    Refill:  2     Historical information moved to improve visibility of documentation.  Past Medical History:  Diagnosis Date  . ACUT MI ANTEROLAT WALL SUBSQT EPIS CARE   . Acute  maxillary sinusitis   . ALLERGIC RHINITIS, SEASONAL   . ARTHRITIS, RHEUMATOID   . Atrial fibrillation ([place])    a. after CABG.  . Back pain   . CAD, ARTERY BYPASS GRAFT    a. DES to RCA in 2010 then LAD occlusion s/p CABG 3 06/07/2009 with LIMA to LAD, reverse SVG to D1, reverse SVG to distal RCA. b. Cath 05/08/2016 slightly hypodense region in the intermediate branch, however she had excellent flow, FFR was normal. Vein graft to PDA and the posterolateral branch is patent, patent LIMA to LAD, occluded SVG to diagonal.  . CARPAL TUNNEL SYNDROME, BILATERAL   . CHOLELITHIASIS   . Contrast media allergy   . DERMATITIS, ALLERGIC   . DIABETES MELLITUS, TYPE I    on insulin pump  . DIABETIC  RETINOPATHY   . Hiatal hernia   . HYPERLIPIDEMIA-MIXED   . HYPERTHYROIDISM   . Insulin pump in place   . MIGRAINE W/O AURA W/INTRACT W/STATUS MIGRAINOSUS 02/19/2008  . SINUS TACHYCARDIA 11/08/2010  . TRIGGER FINGER   . URI    Past Surgical History:  Procedure Laterality Date  . ABDOMINAL HYSTERECTOMY    . Caesarean section    . CARDIAC CATHETERIZATION N/A 05/08/2016   Procedure: Left Heart Cath and Cors/Grafts Angiography;  Surgeon: Burnell Blanks, MD;  Location: [facility];  Service: Cardiovascular;  Laterality: N/A;  . CARPAL TUNNEL RELEASE    . CHOLECYSTECTOMY    . CORONARY ARTERY BYPASS GRAFT    . VITRECTOMY     Social History   Tobacco Use  . Smoking status: Never Smoker  . Smokeless tobacco: Never Used  Substance Use Topics  . Alcohol use: Yes    Comment: rarely 1 every 6 months   family history includes Colon polyps in her father; Depression in her unknown relative; Diabetes in her unknown relative; Heart disease in her unknown relative; Hyperlipidemia in her unknown relative; Hypertension in her unknown relative; Migraines in her unknown relative; Stroke in her paternal grandmother.  Medications: Current Outpatient Medications  Medication Sig Dispense Refill  . aspirin  81 MG tablet Take 81 mg by mouth daily.     . Beclomethasone Dipropionate (QNASL) 80 MCG/ACT AERS Place 2 sprays into both nostrils daily. 1 Inhaler 2  . cetirizine (ZYRTEC) 10 MG tablet Take 1 tablet (10 mg total) by mouth daily. 30 tablet 0  . clopidogrel (PLAVIX) 75 MG tablet Take 1 tablet (75 mg total) by mouth daily. 90 tablet 3  . cyclobenzaprine (FLEXERIL) 10 MG tablet Take 1 tablet (10 mg total) by mouth at bedtime. 30 tablet 2  . EPINEPHrine 0.15 MG/0.15ML IJ injection Inject 0.15 mLs (0.15 mg total) into the skin as needed. Inject as directed. 2 Device 1  . flunisolide (NASALIDE) 25 MCG/ACT (0.025%) SOLN Place 2 sprays into the nose 2 (two) times daily. 1 Bottle 5  . folic acid (  FOLVITE) 1 MG tablet Take 2 tablets (2 mg total) by mouth daily. 180 tablet 3  . furosemide (LASIX) 40 MG tablet Take 40 mg by mouth daily as needed for edema (When she feels like she is swelling).    Marland Kitchen glycopyrrolate (ROBINUL) 2 MG tablet Take 1 tablet (2 mg total) by mouth 2 (two) times daily. (Patient taking differently: Take 2 mg by mouth 2 (two) times daily as needed. ) 60 tablet 11  . Golimumab (SIMPONI) 100 MG/ML SOAJ INJECT ONE DEVICE UNDER THE SKIN ONCE MONTHLY 3 Syringe 0  . hydroxychloroquine (PLAQUENIL) 200 MG tablet Take 1 tablet by mouth twice daily. 180 tablet 0  . insulin lispro (HUMALOG) 100 UNIT/ML injection FOR USE IN INSULIN PUMP. TOTAL DAILY INSULIN DOSE = UP TO 90 UNITS.    Marland Kitchen isosorbide mononitrate (IMDUR) 60 MG 24 hr tablet Take 1 tablet (60 mg total) by mouth daily. 30 tablet 11  . leflunomide (ARAVA) 20 MG tablet Take 1 tablet (20 mg total) by mouth daily. 30 tablet 2  . losartan (COZAAR) 50 MG tablet Take 1 tablet (50 mg total) by mouth daily. 90 tablet 3  . metoprolol tartrate (LOPRESSOR) 25 MG tablet Take 1 tablet (25 mg total) by mouth 2 (two) times daily. 180 tablet 3  . montelukast (SINGULAIR) 10 MG tablet TAKE 1 TABLET BY MOUTH AT BEDTIME 30 tablet 5  . nabumetone (RELAFEN) 750 MG  tablet TAKE 1 TABLET BY MOUTH TWICE DAILY 60 tablet 3  . Omega-3 Fatty Acids (FISH OIL) 1200 MG CAPS Take 1,200 mg by mouth 2 (two) times daily.    . ONE TOUCH ULTRA TEST test strip   1  . pantoprazole (PROTONIX) 40 MG tablet Take 1 tablet (40 mg total) by mouth 2 (two) times daily. 60 tablet 11  . potassium chloride (K-DUR) 10 MEQ tablet Take 1 tablet (10 mEq total) by mouth daily. 90 tablet 3  . ranitidine (ZANTAC) 300 MG capsule Take 1 capsule (300 mg total) by mouth every evening. 30 capsule 11  . rosuvastatin (CRESTOR) 10 MG tablet TAKE 1 TABLET (10 MG TOTAL) BY MOUTH DAILY. 90 tablet 3  . clobetasol cream (TEMOVATE) 6.64 % Apply 1 application topically 2 (two) times daily. 60 g 2  . valACYclovir (VALTREX) 1000 MG tablet Take 1 tablet (1,000 mg total) by mouth 3 (three) times daily. 21 tablet 2   No current facility-administered medications for this visit.    Allergies  Allergen Reactions  . Prochlorperazine Rash    Neuro problems per pt Neurological reaction  . Ramipril Swelling, Rash and Other (See Comments)  . Shellfish-Derived Products Swelling    Shrimp(Facial swelling) Shrimp(Facial swelling)  . Atorvastatin Rash    Elevated LFT's Elevated LFT's Elevated LFT's  . Compazine  [Prochlorperazine Edisylate] Other (See Comments)    Neurological reaction  . Etanercept Swelling and Rash  . Infliximab Rash  . Iohexol     Iv contrast dye -rash all over  . Orencia [Abatacept] Rash  . Shellfish Allergy Swelling    Shrimp(Facial swelling)  . Tofacitinib Rash and Other (See Comments)    Severe abdominal pain Severe abdominal pain Severe abdominal pain  . Prochlorperazine Edisylate     unknown  . Amiodarone Nausea Only  . Rituximab Rash    Causes a rash     Discussed warning signs or symptoms. Please see discharge instructions. Patient expresses understanding.

## 2018-08-28 ENCOUNTER — Ambulatory Visit (INDEPENDENT_AMBULATORY_CARE_PROVIDER_SITE_OTHER): Payer: 59

## 2018-08-28 ENCOUNTER — Ambulatory Visit (INDEPENDENT_AMBULATORY_CARE_PROVIDER_SITE_OTHER): Payer: 59 | Admitting: Family Medicine

## 2018-08-28 ENCOUNTER — Encounter: Payer: Self-pay | Admitting: Family Medicine

## 2018-08-28 VITALS — BP 122/59 | HR 76 | Ht 63.0 in | Wt 180.0 lb

## 2018-08-28 DIAGNOSIS — B029 Zoster without complications: Secondary | ICD-10-CM

## 2018-08-28 DIAGNOSIS — M79672 Pain in left foot: Secondary | ICD-10-CM

## 2018-08-28 DIAGNOSIS — L404 Guttate psoriasis: Secondary | ICD-10-CM | POA: Diagnosis not present

## 2018-08-28 MED ORDER — TRIAMCINOLONE ACETONIDE 0.1 % EX CREA: 1.0000 "application " | TOPICAL_CREAM | Freq: Two times a day (BID) | CUTANEOUS | 12 refills | Status: DC

## 2018-08-28 MED ORDER — CLOBETASOL PROPIONATE 0.05 % EX CREA: 1.0000 "application " | TOPICAL_CREAM | Freq: Two times a day (BID) | CUTANEOUS | 12 refills | Status: DC

## 2018-08-28 MED FILL — TRIAMCINOLONE 0.1% CREAM: 0.1 | 30 days supply | Qty: 454 | Fill #0

## 2018-08-28 NOTE — Patient Instructions (Signed)
Thank you for coming in today. Reduce weight bearing on the foot.  Continue the boot.  Use the creams as needed.   Recheck in 2-4 weeks.   If not getting at all next step is MRI.   Let me know if you are worsening before the next visit. I can order it prior to the visit.    Stress Fracture Stress fracture is a small break or crack in a bone. A stress fracture can be fully broken (complete) or partially broken (incomplete). The most common sites for stress fractures are the bones in the front of your feet (metatarsals), your heels (calcaneus), and the long bone of your lower leg (tibia). What are the causes? A stress fracture is caused by overuse or repetitive exercise, such as running. It happens when a bone cannot absorb any more shock because the muscles around it are weak. Stress fractures happen most commonly when:  You rapidly increase or start a new physical activity.  You use shoes that are worn out or do not fit you properly.  You exercise on a new surface.  What increases the risk? You may be at higher risk for this type of fracture if:  You have a condition that causes weak bones (osteoporosis).  You are female. Stress fractures are more likely to occur in women.  What are the signs or symptoms? The most common symptom of a stress fracture is feeling pain when you are using the affected part of your body. The pain usually goes away when you are resting. Other symptoms may include:  Swelling of the affected area.  Pain in the area when it is touched.  Decreased pain while resting.  Stress fracture pain usually develops over time. How is this diagnosed? Diagnosis may include:  Medical history and physical exam.  X-rays.  Bone scan.  MRI.  How is this treated? Treatment depends on the severity of your stress fracture. Treatment usually involves resting, icing, compression, and elevation (RICE) of the affected part of your body. Treatment may also  include:  Medicines to reduce inflammation.  A cast or a walking shoe.  Crutches.  Surgery.  Follow these instructions at home: If you have a cast:  Do not stick anything inside the cast to scratch your skin. Doing that increases your risk of infection.  Check the skin around the cast every day. Report any concerns to your health care provider. You may put lotion on dry skin around the edges of the cast. Do not apply lotion to the skin underneath the cast.  Keep the cast clean and dry.  Cover the cast with a watertight plastic bag to protect it from water while you take a bath or a shower. Do not let the cast get wet.  Do not put pressure on any part of the cast until it is fully hardened. This may take several hours. If You Have a Walking Shoe:   Wear it as directed by your health care provider. Managing pain, stiffness, and swelling  If directed, apply ice to the injured area: ? Put ice in a plastic bag. ? Place a towel between your skin and the bag. ? Leave the ice on for 20 minutes, 2-3 times per day.  Move your fingers or toes often to avoid stiffness and to lessen swelling.  Raise the injured area above the level of your heart while you are sitting or lying down. Activity  Rest as directed by your health care provider. Ask your health  care provider if you may do alternative exercises, such as swimming or biking, while you are healing.  Return to your normal activities as directed by your health care provider. Ask your health care provider what activities are safe for you.  Perform range-of-motion exercises only as directed by your health care provider. Safety  Do not use the injured limb to support yourbody weight until your health care provider says that you can. Use crutches if your health care provider tells you to do so. General instructions  Do not use any tobacco products, including cigarettes, chewing tobacco, or electronic cigarettes. Tobacco can delay  bone healing. If you need help quitting, ask your health care provider.  Take medicines only as directed by your health care provider.  Keep all follow-up visits as directed by your health care provider. This is important. How is this prevented?  Only wear shoes that: ? Fit well. ? Are not worn out.  Eat a healthy diet that contains vitamin D and calcium. This helps keeps your bones strong.  Be careful when you start a new physical activity. Give your body time to adjust.  Avoid doing only one kind of activity. Do different exercises, such as swimming and running, so that no single part of your body gets overused.  Do strength-training exercises. Contact a health care provider if:  Your pain gets worse.  You have new symptoms.  You have increased swelling. Get help right away if:  You lose feeling in the affected area. This information is not intended to replace advice given to you by your health care provider. Make sure you discuss any questions you have with your health care provider. Document Released: 03/09/2003 Document Revised: 08/15/2016 Document Reviewed: 07/22/2014 Elsevier Interactive Patient Education  [employer].

## 2018-08-29 ENCOUNTER — Telehealth: Payer: Self-pay | Admitting: Family Medicine

## 2018-08-29 NOTE — Progress Notes (Signed)
[name] is a 52 y.o. female who presents to [facility] today for follow-up foot pain and rash.  [name] was seen on August 15 for left foot pain thought to be due to partial rupture of the plantar fascia.  She was treated with a Cam walker boot which has been very helpful.  She notes the plantar calcaneus pain is still present but significantly improving.  However she notes new pain and swelling on the dorsal lateral midfoot.  She notes this is been slowly worsening over the last 2 weeks.  Is becoming more painful and becoming more noticeable with weightbearing.  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  She denies any significant change in activity level from August 15 till today.  Rash: She was seen on the 15th for 2 rashes.  She had a rash on her left back thought to be due to shingles.  This was treated with valacyclovir and is significantly improved and not bothersome.  Additionally she had mildly itchy rash across her body that she thought was due to an outbreak of guttate psoriasis.  She notes with the clobetasol cream prescribed that has been improving but she like a bigger tube of less potent steroid cream.   ROS as above:  Exam:  BP (!) 122/59   Pulse 76   Ht 5\' 3"  (1.6 m)   Wt 180 lb (81.6 kg)   BMI 31.89 kg/m  Wt Readings from Last 5 Encounters:  08/28/18 180 lb (81.6 kg)  08/14/18 176 lb (79.8 kg)  07/10/18 177 lb (80.3 kg)  07/10/18 178 lb (80.7 kg)  07/07/18 178 lb (80.7 kg)    Gen: Well NAD HEENT: EOMI,  MMM Lungs: Normal work of breathing. CTABL Heart: RRR no MRG Abd: NABS, Soft. Nondistended, Nontender Exts: Brisk capillary refill, warm and well perfused.  Left foot: Slightly swollen at the dorsal midfoot.  Otherwise normal-appearing Normal foot and ankle  motion. Tender to palpation dorsal midfoot in the region of the lateral cuneiform or cuboid or proximal fourth metatarsal. Plantar calcaneus is mildly tender to palpation. Pulses capillary refill and sensation are intact distally.  Lab and Radiology Results X-ray images personally independently reviewed The foot: No acute fracture or healing stress fracture present. Small plantar calcaneus heel spur present.  No severe degenerative changes. Awaiting formal radiology review   Assessment and Plan: 52 y.o. female with left foot pain: Patient has what I suspect is a healing partial tear of her plantar fascia.  However she has new midfoot pain that I am concerned may be a radiographically occult fracture or stress fracture that is developing.  She is currently well controlled in a Cam walker boot.  Plan to limit weightbearing and obtain x-rays as noted above.  If pain does not improve with limited weightbearing it would be reasonable next step would be to proceed with MRI. Recheck in 3 weeks or so.  Rash: Shingles rash now resolving.  Psoriasis rash: Improving.  Continue clobetasol cream for hot spots and use triamcinolone cream for a larger surface area if needed.  Triamcinolone prescribed.   Orders Placed This Encounter  Procedures  . DG Foot Complete Left    Standing Status:   Future    Number of Occurrences:   1    Standing Expiration Date:   10/29/2019  Order Specific Question:   Reason for Exam (SYMPTOM  OR DIAGNOSIS REQUIRED)    Answer:   eval dorsal midfoot pain and swelling.    Order Specific Question:   Is patient pregnant?    Answer:   No    Order Specific Question:   Preferred imaging location?    Answer:   [facility]    Order Specific Question:   Radiology Contrast Protocol - do NOT remove file path    Answer:   [email or web address]   Meds ordered this encounter  Medications  . triamcinolone cream (KENALOG) 0.1 %     Sig: Apply 1 application topically 2 (two) times daily.    Dispense:  453.6 g    Refill:  12  . clobetasol cream (TEMOVATE) 0.05 %    Sig: Apply 1 application topically 2 (two) times daily.    Dispense:  60 g    Refill:  12     Historical information moved to improve visibility of documentation.  Past Medical History:  Diagnosis Date  . ACUT MI ANTEROLAT WALL SUBSQT EPIS CARE   . Acute maxillary sinusitis   . ALLERGIC RHINITIS, SEASONAL   . ARTHRITIS, RHEUMATOID   . Atrial fibrillation ([place])    a. after CABG.  . Back pain   . CAD, ARTERY BYPASS GRAFT    a. DES to RCA in 2010 then LAD occlusion s/p CABG 3 06/07/2009 with LIMA to LAD, reverse SVG to D1, reverse SVG to distal RCA. b. Cath 05/08/2016 slightly hypodense region in the intermediate branch, however she had excellent flow, FFR was normal. Vein graft to PDA and the posterolateral branch is patent, patent LIMA to LAD, occluded SVG to diagonal.  . CARPAL TUNNEL SYNDROME, BILATERAL   . CHOLELITHIASIS   . Contrast media allergy   . DERMATITIS, ALLERGIC   . DIABETES MELLITUS, TYPE I    on insulin pump  . DIABETIC  RETINOPATHY   . Hiatal hernia   . HYPERLIPIDEMIA-MIXED   . HYPERTHYROIDISM   . Insulin pump in place   . MIGRAINE W/O AURA W/INTRACT W/STATUS MIGRAINOSUS 02/19/2008  . SINUS TACHYCARDIA 11/08/2010  . TRIGGER FINGER   . URI    Past Surgical History:  Procedure Laterality Date  . ABDOMINAL HYSTERECTOMY    . Caesarean section    . CARDIAC CATHETERIZATION N/A 05/08/2016   Procedure: Left Heart Cath and Cors/Grafts Angiography;  Surgeon: Burnell Blanks, MD;  Location: [facility];  Service: Cardiovascular;  Laterality: N/A;  . CARPAL TUNNEL RELEASE    . CHOLECYSTECTOMY    . CORONARY ARTERY BYPASS GRAFT    . VITRECTOMY     Social History   Tobacco Use  . Smoking status: Never Smoker  . Smokeless tobacco: Never Used  Substance Use Topics  . Alcohol use: Yes    Comment: rarely 1 every 6 months    family history includes Colon polyps in her father; Depression in her unknown relative; Diabetes in her unknown relative; Heart disease in her unknown relative; Hyperlipidemia in her unknown relative; Hypertension in her unknown relative; Migraines in her unknown relative; Stroke in her paternal grandmother.  Medications: Current Outpatient Medications  Medication Sig Dispense Refill  . aspirin 81 MG tablet Take 81 mg by mouth daily.     . Beclomethasone Dipropionate (QNASL) 80 MCG/ACT AERS Place 2 sprays into both nostrils daily. 1 Inhaler 2  . cetirizine (ZYRTEC) 10 MG tablet Take 1 tablet (10 mg total) by mouth daily. 30 tablet  0  . clobetasol cream (TEMOVATE) 6.72 % Apply 1 application topically 2 (two) times daily. 60 g 12  . clopidogrel (PLAVIX) 75 MG tablet Take 1 tablet (75 mg total) by mouth daily. 90 tablet 3  . cyclobenzaprine (FLEXERIL) 10 MG tablet Take 1 tablet (10 mg total) by mouth at bedtime. 30 tablet 2  . EPINEPHrine 0.15 MG/0.15ML IJ injection Inject 0.15 mLs (0.15 mg total) into the skin as needed. Inject as directed. 2 Device 1  . flunisolide (NASALIDE) 25 MCG/ACT (0.025%) SOLN Place 2 sprays into the nose 2 (two) times daily. 1 Bottle 5  . folic acid (FOLVITE) 1 MG tablet Take 2 tablets (2 mg total) by mouth daily. 180 tablet 3  . furosemide (LASIX) 40 MG tablet Take 40 mg by mouth daily as needed for edema (When she feels like she is swelling).    Marland Kitchen glycopyrrolate (ROBINUL) 2 MG tablet Take 1 tablet (2 mg total) by mouth 2 (two) times daily. (Patient taking differently: Take 2 mg by mouth 2 (two) times daily as needed. ) 60 tablet 11  . Golimumab (SIMPONI) 100 MG/ML SOAJ INJECT ONE DEVICE UNDER THE SKIN ONCE MONTHLY 3 Syringe 0  . hydroxychloroquine (PLAQUENIL) 200 MG tablet Take 1 tablet by mouth twice daily. 180 tablet 0  . insulin lispro (HUMALOG) 100 UNIT/ML injection FOR USE IN INSULIN PUMP. TOTAL DAILY INSULIN DOSE = UP TO 90 UNITS.    Marland Kitchen isosorbide mononitrate  (IMDUR) 60 MG 24 hr tablet Take 1 tablet (60 mg total) by mouth daily. 30 tablet 11  . leflunomide (ARAVA) 20 MG tablet Take 1 tablet (20 mg total) by mouth daily. 30 tablet 2  . losartan (COZAAR) 50 MG tablet Take 1 tablet (50 mg total) by mouth daily. 90 tablet 3  . metoprolol tartrate (LOPRESSOR) 25 MG tablet Take 1 tablet (25 mg total) by mouth 2 (two) times daily. 180 tablet 3  . montelukast (SINGULAIR) 10 MG tablet TAKE 1 TABLET BY MOUTH AT BEDTIME 30 tablet 5  . nabumetone (RELAFEN) 750 MG tablet TAKE 1 TABLET BY MOUTH TWICE DAILY 60 tablet 3  . Omega-3 Fatty Acids (FISH OIL) 1200 MG CAPS Take 1,200 mg by mouth 2 (two) times daily.    . ONE TOUCH ULTRA TEST test strip   1  . pantoprazole (PROTONIX) 40 MG tablet Take 1 tablet (40 mg total) by mouth 2 (two) times daily. 60 tablet 11  . potassium chloride (K-DUR) 10 MEQ tablet Take 1 tablet (10 mEq total) by mouth daily. 90 tablet 3  . ranitidine (ZANTAC) 300 MG capsule Take 1 capsule (300 mg total) by mouth every evening. 30 capsule 11  . rosuvastatin (CRESTOR) 10 MG tablet TAKE 1 TABLET (10 MG TOTAL) BY MOUTH DAILY. 90 tablet 3  . valACYclovir (VALTREX) 1000 MG tablet Take 1 tablet (1,000 mg total) by mouth 3 (three) times daily. 21 tablet 2  . triamcinolone cream (KENALOG) 0.1 % Apply 1 application topically 2 (two) times daily. 453.6 g 12   No current facility-administered medications for this visit.    Allergies  Allergen Reactions  . Prochlorperazine Rash    Neuro problems per pt Neurological reaction  . Ramipril Swelling, Rash and Other (See Comments)  . Shellfish-Derived Products Swelling    Shrimp(Facial swelling) Shrimp(Facial swelling)  . Atorvastatin Rash    Elevated LFT's Elevated LFT's Elevated LFT's  . Compazine  [Prochlorperazine Edisylate] Other (See Comments)    Neurological reaction  . Etanercept Swelling and Rash  .  Infliximab Rash  . Iohexol     Iv contrast dye -rash all over  . Orencia [Abatacept] Rash   . Shellfish Allergy Swelling    Shrimp(Facial swelling)  . Tofacitinib Rash and Other (See Comments)    Severe abdominal pain Severe abdominal pain Severe abdominal pain  . Prochlorperazine Edisylate     unknown  . Amiodarone Nausea Only  . Rituximab Rash    Causes a rash     Discussed warning signs or symptoms. Please see discharge instructions. Patient expresses understanding.

## 2018-08-29 NOTE — Telephone Encounter (Signed)
-----   Message from Gregor Hams, MD sent at 08/28/2018  4:23 PM EDT ----- Regarding: shingrix Please add to Shingrix list

## 2018-08-29 NOTE — Telephone Encounter (Signed)
Added

## 2018-09-03 MED FILL — SIMPONI 100 MG/ML SOAJ: 100 | 30 days supply | Qty: 1 | Fill #1

## 2018-09-04 ENCOUNTER — Other Ambulatory Visit: Payer: Self-pay | Admitting: Cardiovascular Disease

## 2018-09-04 MED FILL — CLOPIDOGREL 75 MG TABLET: 75 | 90 days supply | Qty: 90 | Fill #0

## 2018-09-04 MED FILL — CYCLOBENZAPRINE HCL 10 MG T: 10 | 30 days supply | Qty: 30 | Fill #0

## 2018-09-04 MED FILL — LEFLUNOMIDE 20 MG TABLET: 20 | 30 days supply | Qty: 30 | Fill #1

## 2018-09-04 MED FILL — MONTELUKAST SOD 10 MG TAB: 10 | 30 days supply | Qty: 30 | Fill #3

## 2018-09-04 MED FILL — NABUMETONE 750 MG TABLET: 750 | 30 days supply | Qty: 60 | Fill #3

## 2018-09-16 ENCOUNTER — Other Ambulatory Visit: Payer: Self-pay | Admitting: Rheumatology

## 2018-09-16 MED FILL — raNITIdine HCL 300 MG TABS: 300 | 30 days supply | Qty: 30 | Fill #10

## 2018-09-16 MED FILL — HYDROXYCHLOROQUINE SULFATE: 200 | 90 days supply | Qty: 180 | Fill #0

## 2018-09-16 MED FILL — PANTOPRAZOLE SOD DR 40 MG T: 40 | 30 days supply | Qty: 60 | Fill #1

## 2018-09-16 MED FILL — ISOSORBIDE MN ER 60 MG TAB: 60 | 30 days supply | Qty: 30 | Fill #3

## 2018-09-16 NOTE — Telephone Encounter (Signed)
Last visit: 06/23/18 Next Visit: 11/17/18 Labs: 07/31/18 Glucose is 113. All other labs are WNL. PLQ Eye Exam: 01/01/18 WNL  Okay to refill per Dr. Estanislado Pandy

## 2018-09-23 DIAGNOSIS — E1065 Type 1 diabetes mellitus with hyperglycemia: Secondary | ICD-10-CM | POA: Diagnosis not present

## 2018-09-23 DIAGNOSIS — E1039 Type 1 diabetes mellitus with other diabetic ophthalmic complication: Secondary | ICD-10-CM | POA: Diagnosis not present

## 2018-10-01 ENCOUNTER — Other Ambulatory Visit: Payer: Self-pay | Admitting: Cardiovascular Disease

## 2018-10-01 ENCOUNTER — Other Ambulatory Visit: Payer: Self-pay | Admitting: Rheumatology

## 2018-10-01 MED FILL — NABUMETONE 750 MG TABLET: 750 | 30 days supply | Qty: 60 | Fill #0

## 2018-10-01 MED FILL — LEFLUNOMIDE 20 MG TABLET: 20 | 30 days supply | Qty: 30 | Fill #2

## 2018-10-01 MED FILL — CYCLOBENZAPRINE HCL 10 MG T: 10 | 30 days supply | Qty: 30 | Fill #1

## 2018-10-01 MED FILL — MONTELUKAST SOD 10 MG TAB: 10 | 30 days supply | Qty: 30 | Fill #4

## 2018-10-01 MED FILL — METOPROLOL TARTRATE 25 MG T: 25 | 90 days supply | Qty: 180 | Fill #0

## 2018-10-01 NOTE — Telephone Encounter (Signed)
Last visit: 06/23/18 Next Visit: 11/17/18 Labs: 07/31/18 Glucose is 113. All other labs are WNL  Okay to refill per Dr. Estanislado Pandy

## 2018-10-02 MED FILL — SIMPONI 100 MG/ML SOAJ: 100 | 30 days supply | Qty: 1 | Fill #2

## 2018-10-03 MED FILL — CONTOUR NEXT STRIPS: 30 days supply | Qty: 300 | Fill #2

## 2018-10-06 MED FILL — HumaLOG 100 UNIT/ML SOLN: 100 | 89 days supply | Qty: 80 | Fill #0

## 2018-10-10 MED FILL — ISOSORBIDE MN ER 60 MG TAB: 60 | 30 days supply | Qty: 30 | Fill #4

## 2018-10-29 ENCOUNTER — Other Ambulatory Visit: Payer: Self-pay | Admitting: Rheumatology

## 2018-10-29 ENCOUNTER — Other Ambulatory Visit: Payer: Self-pay | Admitting: Pharmacist

## 2018-10-29 MED ORDER — GOLIMUMAB 100 MG/ML ~~LOC~~ SOAJ: SUBCUTANEOUS | 0 refills | Status: DC

## 2018-10-29 NOTE — Telephone Encounter (Signed)
Last visit: 06/23/18 Next Visit: 11/17/18 Labs: 07/31/18 Glucose is 113. All other labs are WNL. Tb Gold: 07/31/18 Neg   Okay to refill per Dr. Estanislado Pandy

## 2018-10-30 MED FILL — SIMPONI 100 MG/ML SOAJ: 100 | 30 days supply | Qty: 1 | Fill #0

## 2018-11-03 ENCOUNTER — Other Ambulatory Visit: Payer: Self-pay | Admitting: Cardiovascular Disease

## 2018-11-03 ENCOUNTER — Other Ambulatory Visit: Payer: Self-pay | Admitting: Rheumatology

## 2018-11-03 ENCOUNTER — Other Ambulatory Visit: Payer: Self-pay | Admitting: Gastroenterology

## 2018-11-03 DIAGNOSIS — I10 Essential (primary) hypertension: Secondary | ICD-10-CM

## 2018-11-03 MED FILL — LEFLUNOMIDE 20 MG TABLET: 20 | 30 days supply | Qty: 30 | Fill #0

## 2018-11-03 MED FILL — MONTELUKAST SOD 10 MG TAB: 10 | 30 days supply | Qty: 30 | Fill #5

## 2018-11-03 MED FILL — LOSARTAN POTASSIUM 50 MG TA: 50 | 90 days supply | Qty: 90 | Fill #0

## 2018-11-03 MED FILL — NABUMETONE 750 MG TABLET: 750 | 30 days supply | Qty: 60 | Fill #1

## 2018-11-03 MED FILL — CYCLOBENZAPRINE HCL 10 MG T: 10 | 30 days supply | Qty: 30 | Fill #2

## 2018-11-03 NOTE — Telephone Encounter (Signed)
Last visit: 06/23/18 Next Visit: 11/17/18 Labs: 8/1/19Glucose is 113. All other labs are WNL  Patient advised she is due to update labs. Patient will update as soon as possible.   Okay to refill 30 day supply per Dr. Estanislado Pandy

## 2018-11-05 ENCOUNTER — Other Ambulatory Visit
Admission: RE | Admit: 2018-11-05 | Discharge: 2018-11-05 | Disposition: A | Discharge status: Home / Self Care | Payer: 59 | Source: Ambulatory Visit | Attending: Rheumatology | Admitting: Rheumatology

## 2018-11-05 DIAGNOSIS — Z79899 Other long term (current) drug therapy: Secondary | ICD-10-CM | POA: Diagnosis not present

## 2018-11-05 LAB — CBC WITH DIFFERENTIAL/PLATELET
Abs Immature Granulocytes: 0.01 10*3/uL (ref 0.00–0.07)
BASOS PCT: 1 %
Basophils Absolute: 0 10*3/uL (ref 0.0–0.1)
EOS ABS: 0.2 10*3/uL (ref 0.0–0.5)
Eosinophils Relative: 3 %
HCT: 36.9 % (ref 36.0–46.0)
Hemoglobin: 12.6 g/dL (ref 12.0–15.0)
IMMATURE GRANULOCYTES: 0 %
Lymphocytes Relative: 40 %
Lymphs Abs: 2.2 10*3/uL (ref 0.7–4.0)
MCH: 30.1 pg (ref 26.0–34.0)
MCHC: 34.1 g/dL (ref 30.0–36.0)
MCV: 88.3 fL (ref 80.0–100.0)
MONOS PCT: 10 %
Monocytes Absolute: 0.5 10*3/uL (ref 0.1–1.0)
NEUTROS PCT: 46 %
Neutro Abs: 2.4 10*3/uL (ref 1.7–7.7)
PLATELETS: 188 10*3/uL (ref 150–400)
RBC: 4.18 MIL/uL (ref 3.87–5.11)
RDW: 11.9 % (ref 11.5–15.5)
WBC: 5.3 10*3/uL (ref 4.0–10.5)
nRBC: 0 % (ref 0.0–0.2)

## 2018-11-05 LAB — COMPREHENSIVE METABOLIC PANEL
ALK PHOS: 65 U/L (ref 38–126)
ALT: 24 U/L (ref 0–44)
AST: 27 U/L (ref 15–41)
Albumin: 4.1 g/dL (ref 3.5–5.0)
Anion gap: 10 (ref 5–15)
BILIRUBIN TOTAL: 0.8 mg/dL (ref 0.3–1.2)
BUN: 12 mg/dL (ref 6–20)
CALCIUM: 9.1 mg/dL (ref 8.9–10.3)
CHLORIDE: 105 mmol/L (ref 98–111)
CO2: 24 mmol/L (ref 22–32)
CREATININE: 0.8 mg/dL (ref 0.44–1.00)
Glucose, Bld: 125 mg/dL — ABNORMAL HIGH (ref 70–99)
Potassium: 3.9 mmol/L (ref 3.5–5.1)
Sodium: 139 mmol/L (ref 135–145)
Total Protein: 6.8 g/dL (ref 6.5–8.1)

## 2018-11-17 ENCOUNTER — Ambulatory Visit: Payer: 59 | Admitting: Rheumatology

## 2018-11-19 ENCOUNTER — Other Ambulatory Visit
Admission: RE | Admit: 2018-11-19 | Discharge: 2018-11-19 | Disposition: A | Discharge status: Home / Self Care | Payer: 59 | Source: Ambulatory Visit | Attending: *Deleted | Admitting: *Deleted

## 2018-11-19 DIAGNOSIS — E0500 Thyrotoxicosis with diffuse goiter without thyrotoxic crisis or storm: Secondary | ICD-10-CM | POA: Insufficient documentation

## 2018-11-19 LAB — TSH: TSH: 0.01 u[IU]/mL — ABNORMAL LOW (ref 0.350–4.500)

## 2018-11-19 LAB — T4, FREE: Free T4: 3.84 ng/dL — ABNORMAL HIGH (ref 0.82–1.77)

## 2018-11-20 LAB — T3, FREE: T3, Free: 9.1 pg/mL — ABNORMAL HIGH (ref 2.0–4.4)

## 2018-11-20 MED FILL — ISOSORBIDE MN ER 60 MG TAB: 60 | 30 days supply | Qty: 30 | Fill #5

## 2018-11-20 MED FILL — CONTOUR NEXT STRIPS: 30 days supply | Qty: 300 | Fill #3

## 2018-11-20 MED FILL — methIMAzole 10 MG TABS: 10 | 30 days supply | Qty: 60 | Fill #0

## 2018-12-02 MED FILL — SIMPONI 100 MG/ML SOAJ: 100 | 30 days supply | Qty: 1 | Fill #1

## 2018-12-02 MED FILL — METOPROLOL TARTRATE 50 MG T: 50 | 30 days supply | Qty: 60 | Fill #0

## 2018-12-08 ENCOUNTER — Other Ambulatory Visit: Payer: Self-pay | Admitting: Rheumatology

## 2018-12-08 ENCOUNTER — Other Ambulatory Visit: Payer: Self-pay | Admitting: Physician Assistant

## 2018-12-08 NOTE — Telephone Encounter (Signed)
Last visit: 06/23/18 Next Visit: 01/12/19 Labs: 11/05/18 Glucose is 125. All other labs are WNL  Okay to refill per Dr. Estanislado Pandy

## 2018-12-10 DIAGNOSIS — E10319 Type 1 diabetes mellitus with unspecified diabetic retinopathy without macular edema: Secondary | ICD-10-CM | POA: Diagnosis not present

## 2018-12-10 DIAGNOSIS — E0500 Thyrotoxicosis with diffuse goiter without thyrotoxic crisis or storm: Secondary | ICD-10-CM | POA: Diagnosis not present

## 2018-12-10 LAB — HEMOGLOBIN A1C: Hemoglobin A1C: 6.3

## 2018-12-29 ENCOUNTER — Other Ambulatory Visit: Payer: Self-pay | Admitting: Rheumatology

## 2018-12-29 NOTE — Progress Notes (Signed)
Office Visit Note  Patient: [name]             Date of Birth: [birth year]           MRN: [id number]             PCP: Lavada Mesi Referring: Donella Stade, PA-C Visit Date: 01/12/2019 Occupation: @GUAROCC @  Subjective:  Pain in both hands    History of Present Illness: [name] is a 52 y.o. female with history of seronegative rheumatoid arthritis.  She is on PLQ 200 mg BID, Simponi sq monthly injections, and Arava 20 mg po daily.  She takes Relafen 750 mg po BID for pain relief.  She denies missing any doses of her medications recently.  She is due for her next Simponi injection this week.  She states she recently had a PLQ eye exam performed which is normal.  She states that Dr. Rodena Piety did recommend possibly lowering the dose of PLQ.  She states that for the past 3 weeks she has been having increased pain, stiffness, and joint swelling in both hands.  She states she has woken up at night with pain and stiffness in both hands.  Her morning stiffness has been lasting longer as well.  She states she has been working more recently and is getting ready to move.  She states she has pain in both knees, especially when going up steps. She has been having worsening fatigue, which she attributes to Grave's disease.  She is followed by endocrinology.    She has tried Orencia, Humira, and Remicade in the past.   Activities of Daily Living:  Patient reports morning stiffness for 2  hours.   Patient Reports nocturnal pain.  Difficulty dressing/grooming: Denies Difficulty climbing stairs: Reports Difficulty getting out of chair: Reports Difficulty using hands for taps, buttons, cutlery, and/or writing: Denies  Review of Systems  Constitutional: Positive for fatigue.  HENT: Positive for mouth dryness. Negative for mouth sores and nose dryness.   Eyes: Positive for dryness. Negative for pain and visual disturbance.  Respiratory: Negative for cough, hemoptysis, shortness of  breath and difficulty breathing.   Cardiovascular: Negative for chest pain, palpitations, hypertension and swelling in legs/feet.  Gastrointestinal: Positive for diarrhea. Negative for blood in stool and constipation.  Endocrine: Negative for increased urination.  Genitourinary: Negative for painful urination.  Musculoskeletal: Positive for arthralgias, joint pain, joint swelling, myalgias, morning stiffness and myalgias. Negative for muscle weakness and muscle tenderness.  Skin: Positive for rash. Negative for color change, pallor, hair loss, nodules/bumps, skin tightness, ulcers and sensitivity to sunlight.  Allergic/Immunologic: Negative for susceptible to infections.  Neurological: Negative for dizziness, numbness, headaches and weakness.  Hematological: Negative for swollen glands.  Psychiatric/Behavioral: Negative for depressed mood and sleep disturbance. The patient is not nervous/anxious.     PMFS History:  Patient Active Problem List   Diagnosis Date Noted  . Cough 06/26/2018  . PND (post-nasal drip) 06/26/2018  . Vitamin D deficiency 07/04/2017  . B12 deficiency 07/04/2017  . Abnormal CT scan 06/19/2017  . Gastroesophageal reflux disease 06/19/2017  . Antiplatelet or antithrombotic long-term use 06/19/2017  . Sinus pressure 03/15/2017  . Nasal congestion 03/15/2017  . Graves disease 02/01/2017  . Sleep difficulties 02/01/2017  . No energy 02/01/2017  . Osteopenia 02/01/2017  . Hypertension, essential 05/08/2016  . IDDM (insulin dependent diabetes mellitus) ([place]) 05/08/2016  . Coronary artery disease involving native coronary artery of native heart with angina pectoris ([place])   .  HYPERTHYROIDISM 11/09/2010  . SINUS TACHYCARDIA 11/08/2010  . SHORTNESS OF BREATH 11/08/2010  . DERMATITIS, ALLERGIC 07/20/2010  . EDEMA 07/12/2010  . DIZZINESS 11/14/2009  . ATRIAL FIBRILLATION 07/20/2009  . CAD, ARTERY BYPASS GRAFT 06/07/2009  . ANGINA, STABLE/EXERTIONAL 06/02/2009  .  HYPERLIPIDEMIA-MIXED 04/26/2009  . ACUT MI ANTEROLAT WALL SUBSQT EPIS CARE 04/26/2009  . Chest pain 04/26/2009  . DIABETIC  RETINOPATHY 04/25/2009  . CARPAL TUNNEL SYNDROME, BILATERAL 04/25/2009  . TRIGGER FINGER 04/25/2009  . MIGRAINE W/O AURA W/INTRACT W/STATUS MIGRAINOSUS 02/19/2008  . ALLERGIC RHINITIS, SEASONAL 10/16/2006  . CHOLELITHIASIS 10/16/2006  . Rheumatoid arthritis ([place]) 10/16/2006    Past Medical History:  Diagnosis Date  . ACUT MI ANTEROLAT WALL SUBSQT EPIS CARE   . Acute maxillary sinusitis   . ALLERGIC RHINITIS, SEASONAL   . ARTHRITIS, RHEUMATOID   . Atrial fibrillation ([place])    a. after CABG.  . Back pain   . CAD, ARTERY BYPASS GRAFT    a. DES to RCA in 2010 then LAD occlusion s/p CABG 3 06/07/2009 with LIMA to LAD, reverse SVG to D1, reverse SVG to distal RCA. b. Cath 05/08/2016 slightly hypodense region in the intermediate branch, however she had excellent flow, FFR was normal. Vein graft to PDA and the posterolateral branch is patent, patent LIMA to LAD, occluded SVG to diagonal.  . CARPAL TUNNEL SYNDROME, BILATERAL   . CHOLELITHIASIS   . Contrast media allergy   . DERMATITIS, ALLERGIC   . DIABETES MELLITUS, TYPE I    on insulin pump  . DIABETIC  RETINOPATHY   . Hiatal hernia   . HYPERLIPIDEMIA-MIXED   . HYPERTHYROIDISM   . Insulin pump in place   . MIGRAINE W/O AURA W/INTRACT W/STATUS MIGRAINOSUS 02/19/2008  . SINUS TACHYCARDIA 11/08/2010  . TRIGGER FINGER   . URI     Family History  Problem Relation Age of Onset  . Colon polyps Father   . Diabetes Other   . Heart disease Other   . Hypertension Other   . Hyperlipidemia Other   . Depression Other   . Migraines Other   . Stroke Paternal Grandmother   . Heart attack Neg Hx   . Colon cancer Neg Hx   . Stomach cancer Neg Hx   . Esophageal cancer Neg Hx    Past Surgical History:  Procedure Laterality Date  . ABDOMINAL HYSTERECTOMY    . Caesarean section    . CARDIAC CATHETERIZATION N/A 05/08/2016    Procedure: Left Heart Cath and Cors/Grafts Angiography;  Surgeon: Burnell Blanks, MD;  Location: [facility];  Service: Cardiovascular;  Laterality: N/A;  . CARPAL TUNNEL RELEASE    . CHOLECYSTECTOMY    . CORONARY ARTERY BYPASS GRAFT    . VITRECTOMY     Social History   Social History Narrative   Divorced. Has 2 kids(fraternal twins) daughters age 81. Works at [employer], Never smoked, denies ETOH, no drugs. Drinks diet coke. No exercise.        Objective: Vital Signs: BP 118/68 (BP Location: Left Arm, Patient Position: Sitting, Cuff Size: Normal)   Pulse (!) 59   Resp 12   Ht 5\' 3"  (1.6 m)   Wt 169 lb (76.7 kg)   BMI 29.94 kg/m    Physical Exam Vitals signs and nursing note reviewed.  Constitutional:      Appearance: She is well-developed.  HENT:     Head: Normocephalic and atraumatic.  Eyes:     Conjunctiva/sclera: Conjunctivae normal.  Neck:     Musculoskeletal: Normal range of motion.  Cardiovascular:     Rate and Rhythm: Normal rate and regular rhythm.     Heart sounds: Normal heart sounds.  Pulmonary:     Effort: Pulmonary effort is normal.     Breath sounds: Normal breath sounds.  Abdominal:     General: Bowel sounds are normal.     Palpations: Abdomen is soft.  Lymphadenopathy:     Cervical: No cervical adenopathy.  Skin:    General: Skin is warm and dry.     Capillary Refill: Capillary refill takes less than 2 seconds.  Neurological:     Mental Status: She is alert and oriented to person, place, and time.  Psychiatric:        Behavior: Behavior normal.      Musculoskeletal Exam: C-spine, thoracic spine, and lumbar spine good ROM.  No midline spinal tenderness.  No SI joint tenderness.  Shoulder joints good ROM.  Mild elbow joint contractures.  Discomfort with wrist joint ROM.  She has tenderness and synovitis of all MCPs.  She has tenderness of PIP joints.  Right hip good ROM with discomfort.  Left hip full ROM with no  discomfort.  Knee joints good ROM with no warmth or effusion.  She has bilateral knee crepitus.  No tenderness or swelling of ankle joints.  No tenderness of MTPs.  She has tenderness over bilateral trochanteric bursa.   CDAI Exam: CDAI Score: 1.6  Patient Global Assessment: 8 (mm); Provider Global Assessment: 8 (mm) Swollen: 0 ; Tender: 0  Joint Exam   Not documented   There is currently no information documented on the homunculus. Go to the Rheumatology activity and complete the homunculus joint exam.  Investigation: No additional findings.  Imaging: No results found.  Recent Labs: Lab Results  Component Value Date   WBC 5.3 11/05/2018   HGB 12.6 11/05/2018   PLT 188 11/05/2018   NA 139 11/05/2018   K 3.9 11/05/2018   CL 105 11/05/2018   CO2 24 11/05/2018   GLUCOSE 125 (H) 11/05/2018   BUN 12 11/05/2018   CREATININE 0.80 11/05/2018   BILITOT 0.8 11/05/2018   ALKPHOS 65 11/05/2018   AST 27 11/05/2018   ALT 24 11/05/2018   PROT 6.8 11/05/2018   ALBUMIN 4.1 11/05/2018   CALCIUM 9.1 11/05/2018   GFRAA >60 11/05/2018   QFTBGOLDPLUS NEGATIVE 07/31/2018    Speciality Comments: PLQ Eye Exam: 01/01/18 WNL @ [facility]   Procedures:  No procedures performed Allergies: Prochlorperazine; Ramipril; Shellfish-derived products; Atorvastatin; Compazine  [prochlorperazine edisylate]; Etanercept; Infliximab; Iohexol; Orencia [abatacept]; Shellfish allergy; Tofacitinib; Prochlorperazine edisylate; Amiodarone; and Rituximab   Assessment / Plan:     Visit Diagnoses: Rheumatoid arthritis of multiple sites with negative rheumatoid factor (HCC) - RF negative, anti-CCP negative, positive ANA: She has active synovitis on exam.  She has been flaring for 3 weeks.  She has been having severe pain and stiffness in both hands.  She has chronic pain in both knee joints but no warmth or effusion was noted.  She has no missed any doses of her medications recently.  She is on  triple therapy including PLQ 200 mg BID, Arava 20 mg po daily, and Simponi sq injections once monthlly.  She is due for her next Simponi injection this week.  She has tried Enbrel, Humira, Remicade, Orencia, Rituxan, and Morrie Sheldon in the past. She does not want to make any changes at this time.  We briefly discussed Actemra which may be a treatment option in the future. She requested a prednisone taper.  A taper starting at 20 mg and tapering by 5 mg every 2 days was sent to the pharmacy.  She was advised to monitor her blood glucose closely while on the taper.  She was advised to notify us if she develops increased joint pain or joint swelling.  She will follow up in 5 months.   High risk medication use - PLQ, Simponi SQ q month, Arava 20 mg po qd, Relafen 750 mg po bid. eye exam: 01/01/2018.  Last TB gold negative on 07/31/2018.  Most recent CBC/CMP within normal limits on 11/05/2018 and will monitor every 3 months.  Standing orders are in place.  Patient received flu vaccine in September and previously had Pneumovax 23 and Prevnar 13.    Psoriasis: She has active psoriasis.  She continues to use topical agents.   Other insomnia:  She has been having interrupted sleep at night due to experiencing pain in both hands.   Other fatigue: She has been experiencing worsening fatigue related to active Graves' disease.   Other medical conditions are listed as follows:   Osteopenia of multiple sites  History of vitamin D deficiency  History of gastroesophageal reflux (GERD)  Antiplatelet or antithrombotic long-term use - Secondary to coronary artery disease-Plavix.   History of hypertension  History of diabetes mellitus - She is on insulin.   History of Graves' disease: She is followed by an endocrinologist.   History of coronary artery disease  History of migraine  History of hyperlipidemia   Orders: No orders of the defined types were placed in this encounter.  Meds ordered this encounter    Medications  . predniSONE (DELTASONE) 5 MG tablet    Sig: Take 4 tablets by mouth x2 days, 3 tablets by mouth daily x2 days, 2 tablets by mouth daily x2 days, 1 tablet by mouth daily x2 days.    Dispense:  20 tablet    Refill:  0    Face-to-face time spent with patient was 30 minutes. Greater than 50% of time was spent in counseling and coordination of care.  Follow-Up Instructions: Return in about 5 months (around 06/13/2019) for Rheumatoid arthritis.   Ofilia Neas, PA-C  I examined and evaluated the patient with Hazel Sams PA.  Patient continues to have mild inflammatory arthritis.  Although her arthritis is much better controlled on the current combination.  She had intolerance to multiple medications in the past.  She also had an adequate response to several medications in the past.  Per her request we will give her a short prednisone taper.  I am hesitant to change her medications at this point.  The other possibility could be to consider Actemra or Kevzara in the future.  At this point patient does not want to change her therapy.  The plan of care was discussed as noted above.  Bo Merino, MD  Note - This record has been created using Editor, commissioning.  Chart creation errors have been sought, but may not always  have been located. Such creation errors do not reflect on  the standard of medical care.

## 2018-12-29 NOTE — Telephone Encounter (Signed)
Last visit: 06/23/18 Next Visit: 01/12/19 Labs: 11/05/18 Glucose is 125. All other labs are WNL PLQ Eye Exam: 01/01/18 WNL   Okay to refill per Dr. Estanislado Pandy

## 2019-01-05 ENCOUNTER — Encounter (INDEPENDENT_AMBULATORY_CARE_PROVIDER_SITE_OTHER): Payer: 59 | Admitting: Ophthalmology

## 2019-01-05 DIAGNOSIS — E103593 Type 1 diabetes mellitus with proliferative diabetic retinopathy without macular edema, bilateral: Secondary | ICD-10-CM | POA: Diagnosis not present

## 2019-01-05 DIAGNOSIS — M069 Rheumatoid arthritis, unspecified: Secondary | ICD-10-CM

## 2019-01-05 DIAGNOSIS — E10319 Type 1 diabetes mellitus with unspecified diabetic retinopathy without macular edema: Secondary | ICD-10-CM

## 2019-01-05 DIAGNOSIS — I10 Essential (primary) hypertension: Secondary | ICD-10-CM

## 2019-01-05 DIAGNOSIS — H35033 Hypertensive retinopathy, bilateral: Secondary | ICD-10-CM

## 2019-01-05 NOTE — Telephone Encounter (Signed)
Received a Prior Authorization request from [facility] for [facility]. Authorization has been submitted to patient's insurance via Cover My Meds. Will update once we receive a response.

## 2019-01-07 NOTE — Telephone Encounter (Signed)
Received a fax from [place] regarding a prior authorization for [facility]. Authorization has been APPROVED from 01/07/2019 to 01/07/2020.   Will send document to scan center.  Authorization # [id number] Phone # [phone]

## 2019-01-12 ENCOUNTER — Encounter: Payer: Self-pay | Admitting: Rheumatology

## 2019-01-12 ENCOUNTER — Ambulatory Visit: Payer: 59 | Admitting: Rheumatology

## 2019-01-12 VITALS — BP 118/68 | HR 59 | Resp 12 | Ht 63.0 in | Wt 169.0 lb

## 2019-01-12 DIAGNOSIS — Z8719 Personal history of other diseases of the digestive system: Secondary | ICD-10-CM | POA: Diagnosis not present

## 2019-01-12 DIAGNOSIS — Z8639 Personal history of other endocrine, nutritional and metabolic disease: Secondary | ICD-10-CM

## 2019-01-12 DIAGNOSIS — Z8679 Personal history of other diseases of the circulatory system: Secondary | ICD-10-CM

## 2019-01-12 DIAGNOSIS — E0500 Thyrotoxicosis with diffuse goiter without thyrotoxic crisis or storm: Secondary | ICD-10-CM | POA: Diagnosis not present

## 2019-01-12 DIAGNOSIS — M8589 Other specified disorders of bone density and structure, multiple sites: Secondary | ICD-10-CM | POA: Diagnosis not present

## 2019-01-12 DIAGNOSIS — L409 Psoriasis, unspecified: Secondary | ICD-10-CM | POA: Diagnosis not present

## 2019-01-12 DIAGNOSIS — Z7902 Long term (current) use of antithrombotics/antiplatelets: Secondary | ICD-10-CM | POA: Diagnosis not present

## 2019-01-12 DIAGNOSIS — Z8669 Personal history of other diseases of the nervous system and sense organs: Secondary | ICD-10-CM

## 2019-01-12 DIAGNOSIS — G4709 Other insomnia: Secondary | ICD-10-CM

## 2019-01-12 DIAGNOSIS — E10319 Type 1 diabetes mellitus with unspecified diabetic retinopathy without macular edema: Secondary | ICD-10-CM | POA: Diagnosis not present

## 2019-01-12 DIAGNOSIS — Z79899 Other long term (current) drug therapy: Secondary | ICD-10-CM | POA: Diagnosis not present

## 2019-01-12 DIAGNOSIS — M0609 Rheumatoid arthritis without rheumatoid factor, multiple sites: Secondary | ICD-10-CM

## 2019-01-12 DIAGNOSIS — R5383 Other fatigue: Secondary | ICD-10-CM | POA: Diagnosis not present

## 2019-01-12 MED ORDER — PREDNISONE 5 MG PO TABS: ORAL_TABLET | ORAL | 0 refills | Status: DC

## 2019-01-12 NOTE — Patient Instructions (Signed)
Standing Labs We placed an order today for your standing lab work.    Please come back and get your standing labs in February and every 3 months   We have open lab Monday through Friday from 8:30-11:30 AM and 1:30-4:00 PM  at the office of Dr. Shaili Deveshwar.   You may experience shorter wait times on Monday and Friday afternoons. The office is located at [address], [address], [place], [place] [address] No appointment is necessary.   Labs are drawn by [facility].  You may receive a bill from [facility] for your lab work.  If you wish to have your labs drawn at another location, please call the office 24 hours in advance to send orders.  If you have any questions regarding directions or hours of operation,  please call [phone].   Just as a reminder please drink plenty of water prior to coming for your lab work. Thanks!   

## 2019-01-15 DIAGNOSIS — E1065 Type 1 diabetes mellitus with hyperglycemia: Secondary | ICD-10-CM | POA: Diagnosis not present

## 2019-01-15 DIAGNOSIS — E1039 Type 1 diabetes mellitus with other diabetic ophthalmic complication: Secondary | ICD-10-CM | POA: Diagnosis not present

## 2019-01-22 LAB — HM DIABETES EYE EXAM

## 2019-02-02 ENCOUNTER — Other Ambulatory Visit: Payer: Self-pay | Admitting: Rheumatology

## 2019-02-02 ENCOUNTER — Telehealth: Payer: Self-pay | Admitting: Cardiovascular Disease

## 2019-02-02 NOTE — Telephone Encounter (Signed)
Last Visit: 01/12/19 Next visit: 07/13/19 Labs: 11/06/19  Left message to advise patient she is due to update labs.   Okay to refill 30 day supply per Dr. Estanislado Pandy

## 2019-02-02 NOTE — Telephone Encounter (Signed)
Ms. Mumme reports she had a Graves' flare up a couple months ago. Her HR (which is usually in the 60s and 70s) rose over 100 bpm. Her endocrinologist, Dr. Steffanie Dunn, increased her metoprolol to 50 mg BID. Now her Graves symptoms are under control but her HR is still elevated in the 90s, even while resting. She states the elevated HR is making her tire easily and she is experiencing SOB on exertion.  She has no other symptoms to report and denies swelling and CP. She has no BP readings to report but specifically denies dizziness and lightheadedness.  She is not in acute distress - the symptoms are simply something she has noticed since her HR has been consistently elevated.   She understands she will be called with Dr. Antionette Char recommendations.

## 2019-02-02 NOTE — Telephone Encounter (Signed)
New Message         Pt c/o medication issue:  1. Name of Medication: Metoprolol  2. How are you currently taking this medication (dosage and times per day)? 50 mg x a day  3. Are you having a reaction (difficulty breathing--STAT)? No   4. What is your medication issue? Does not seem to be covering HR

## 2019-02-03 ENCOUNTER — Other Ambulatory Visit: Payer: Self-pay

## 2019-02-03 DIAGNOSIS — Z79899 Other long term (current) drug therapy: Secondary | ICD-10-CM | POA: Diagnosis not present

## 2019-02-03 MED ORDER — METOPROLOL TARTRATE 50 MG PO TABS: 75.0000 mg | ORAL_TABLET | Freq: Two times a day (BID) | ORAL | 3 refills | Status: DC

## 2019-02-03 NOTE — Telephone Encounter (Signed)
Spoke with pt and went over recommendations.  Made pt aware to monitor BP and HR and let us know if getting too low or pt generally feels bad on new dose.  Pt verbalized understanding and was in agreement with this plan.

## 2019-02-03 NOTE — Telephone Encounter (Signed)
Would be ok to increase metoprolol to 75 mg BID and monitor BP and pulse rates. thanks

## 2019-02-04 ENCOUNTER — Other Ambulatory Visit: Payer: Self-pay | Admitting: Rheumatology

## 2019-02-04 LAB — COMPLETE METABOLIC PANEL WITH GFR
AG Ratio: 2 (calc) (ref 1.0–2.5)
ALBUMIN MSPROF: 4 g/dL (ref 3.6–5.1)
ALT: 22 U/L (ref 6–29)
AST: 22 U/L (ref 10–35)
Alkaline phosphatase (APISO): 101 U/L (ref 37–153)
BUN: 8 mg/dL (ref 7–25)
CALCIUM: 9.1 mg/dL (ref 8.6–10.4)
CO2: 26 mmol/L (ref 20–32)
CREATININE: 0.76 mg/dL (ref 0.50–1.05)
Chloride: 105 mmol/L (ref 98–110)
GFR, EST NON AFRICAN AMERICAN: 91 mL/min/{1.73_m2} (ref >=60)
GFR, Est African American: 105 mL/min/{1.73_m2} (ref >=60)
GLUCOSE: 82 mg/dL (ref 65–99)
Globulin: 2 g/dL (calc) (ref 1.9–3.7)
Potassium: 4.2 mmol/L (ref 3.5–5.3)
Sodium: 140 mmol/L (ref 135–146)
Total Bilirubin: 0.3 mg/dL (ref 0.2–1.2)
Total Protein: 6 g/dL — ABNORMAL LOW (ref 6.1–8.1)

## 2019-02-04 LAB — CBC WITH DIFFERENTIAL/PLATELET
ABSOLUTE MONOCYTES: 446 {cells}/uL (ref 200–950)
BASOS ABS: 28 {cells}/uL (ref 0–200)
BASOS PCT: 0.6 %
EOS ABS: 170 {cells}/uL (ref 15–500)
Eosinophils Relative: 3.7 %
HCT: 36 % (ref 35.0–45.0)
HEMOGLOBIN: 12.2 g/dL (ref 11.7–15.5)
LYMPHS ABS: 2019 {cells}/uL (ref 850–3900)
MCH: 28.2 pg (ref 27.0–33.0)
MCHC: 33.9 g/dL (ref 32.0–36.0)
MCV: 83.1 fL (ref 80.0–100.0)
MPV: 10.2 fL (ref 7.5–12.5)
Monocytes Relative: 9.7 %
NEUTROS ABS: 1937 {cells}/uL (ref 1500–7800)
Neutrophils Relative %: 42.1 %
Platelets: 190 10*3/uL (ref 140–400)
RBC: 4.33 10*6/uL (ref 3.80–5.10)
RDW: 12.3 % (ref 11.0–15.0)
Total Lymphocyte: 43.9 %
WBC: 4.6 10*3/uL (ref 3.8–10.8)

## 2019-02-05 ENCOUNTER — Other Ambulatory Visit: Payer: Self-pay | Admitting: Pharmacist

## 2019-02-05 MED ORDER — GOLIMUMAB 100 MG/ML ~~LOC~~ SOAJ: SUBCUTANEOUS | 0 refills | Status: DC

## 2019-02-05 MED FILL — SIMPONI 100 MG/ML SOAJ: 100 | 30 days supply | Qty: 1 | Fill #0

## 2019-02-05 NOTE — Telephone Encounter (Signed)
Last Visit: 01/12/19 Next visit: 07/13/19 Labs: 02/03/19 Total protein borderline low. Rest of labs are WNL. TB Gold: 07/31/18 Neg   Okay to refill per Dr. Estanislado Pandy

## 2019-02-19 ENCOUNTER — Telehealth: Payer: Self-pay | Admitting: Cardiovascular Disease

## 2019-02-19 NOTE — Telephone Encounter (Signed)
New Message   Pt c/o Shortness Of Breath: STAT if SOB developed within the last 24 hours or pt is noticeably SOB on the phone  1. Are you currently SOB (can you hear that pt is SOB on the phone)? No patient is sitting   2. How long have you been experiencing SOB? About a month patient states   3. Are you SOB when sitting or when up moving around? When moving around   4. Are you currently experiencing any other symptoms? Heaviness in the chest (No Pain), a lil swelling in the legs

## 2019-02-19 NOTE — Telephone Encounter (Signed)
Pt calling today with c/o increased SOB and swelling in her lower extremities. She states she has not taken any prn lasix and does not know if she has had a weight gain.  I advised pt to take her lasix for the next couple of days to see if this helps to relieve her sx. She will call back on Monday if she does not notice a difference.   She has verbalized understanding and agreed with plan.

## 2019-03-03 ENCOUNTER — Encounter: Payer: Self-pay | Admitting: Cardiovascular Disease

## 2019-03-04 ENCOUNTER — Encounter: Payer: Self-pay | Admitting: Cardiovascular Disease

## 2019-03-04 ENCOUNTER — Ambulatory Visit: Payer: 59 | Admitting: Cardiovascular Disease

## 2019-03-04 VITALS — BP 112/52 | HR 69 | Ht 63.0 in | Wt 172.2 lb

## 2019-03-04 DIAGNOSIS — I25119 Atherosclerotic heart disease of native coronary artery with unspecified angina pectoris: Secondary | ICD-10-CM

## 2019-03-04 DIAGNOSIS — R0602 Shortness of breath: Secondary | ICD-10-CM

## 2019-03-04 DIAGNOSIS — R609 Edema, unspecified: Secondary | ICD-10-CM | POA: Diagnosis not present

## 2019-03-04 DIAGNOSIS — E785 Hyperlipidemia, unspecified: Secondary | ICD-10-CM | POA: Diagnosis not present

## 2019-03-04 MED ORDER — ROSUVASTATIN CALCIUM 20 MG PO TABS: 20.0000 mg | ORAL_TABLET | Freq: Every day | ORAL | 3 refills | Status: DC

## 2019-03-04 MED ORDER — ISOSORBIDE MONONITRATE ER 60 MG PO TB24: 60.0000 mg | ORAL_TABLET | Freq: Every day | ORAL | 11 refills | Status: DC

## 2019-03-04 MED ORDER — METOPROLOL TARTRATE 50 MG PO TABS: 50.0000 mg | ORAL_TABLET | Freq: Two times a day (BID) | ORAL | 3 refills | Status: DC

## 2019-03-04 NOTE — Progress Notes (Signed)
Cardiology Office Note:    Date:  03/04/2019   ID:  Latalia, Etzler 02-Jan-1966, MRN [id number]  PCP:  Donella Stade, PA-C  Cardiologist:  Sherren Mocha, MD  Electrophysiologist:  None   Referring MD: Donella Stade, PA-C   Chief Complaint  Patient presents with  . Coronary Artery Disease    History of Present Illness:    [name] is a 53 y.o. female with a hx of coronary artery disease, presenting for follow-up evaluation.  The patient initially presented with an inferior wall MI and was treated with primary PCI of the right coronary artery.  She was noted to have chronic total occlusion of the LAD and ultimately underwent multivessel CABG in 2010.  The patient underwent repeat cardiac catheterization in 2017 demonstrating moderate CAD with patency of her bypass grafts to the LAD and RCA.  The saphenous vein graft to diagonal was noted to be occluded.  Pressure wire analysis of the ramus intermedius was negative for ischemia.  The patient is here alone today.  She complains of symptoms of shortness of breath with activity.  She does have some leg swelling that has improved somewhat with oral furosemide.  She denies orthopnea or PND.  She is had no lightheadedness or syncope.  She called in with problems of tachycardia that was related to Graves' disease.  Her beta-blocker was adjusted and her heart rate has come back under better control.  She has had no recent heart palpitations.  Past Medical History:  Diagnosis Date  . ACUT MI ANTEROLAT WALL SUBSQT EPIS CARE   . Acute maxillary sinusitis   . ALLERGIC RHINITIS, SEASONAL   . ARTHRITIS, RHEUMATOID   . Atrial fibrillation ([place])    a. after CABG.  . Back pain   . CAD, ARTERY BYPASS GRAFT    a. DES to RCA in 2010 then LAD occlusion s/p CABG 3 06/07/2009 with LIMA to LAD, reverse SVG to D1, reverse SVG to distal RCA. b. Cath 05/08/2016 slightly hypodense region in the intermediate branch, however she had excellent flow, FFR was  normal. Vein graft to PDA and the posterolateral branch is patent, patent LIMA to LAD, occluded SVG to diagonal.  . CARPAL TUNNEL SYNDROME, BILATERAL   . CHOLELITHIASIS   . Contrast media allergy   . DERMATITIS, ALLERGIC   . DIABETES MELLITUS, TYPE I    on insulin pump  . DIABETIC  RETINOPATHY   . Hiatal hernia   . HYPERLIPIDEMIA-MIXED   . HYPERTHYROIDISM   . Insulin pump in place   . MIGRAINE W/O AURA W/INTRACT W/STATUS MIGRAINOSUS 02/19/2008  . SINUS TACHYCARDIA 11/08/2010  . TRIGGER FINGER   . URI     Past Surgical History:  Procedure Laterality Date  . ABDOMINAL HYSTERECTOMY    . Caesarean section    . CARDIAC CATHETERIZATION N/A 05/08/2016   Procedure: Left Heart Cath and Cors/Grafts Angiography;  Surgeon: Burnell Blanks, MD;  Location: [facility];  Service: Cardiovascular;  Laterality: N/A;  . CARPAL TUNNEL RELEASE    . CHOLECYSTECTOMY    . CORONARY ARTERY BYPASS GRAFT    . VITRECTOMY      Current Medications: Current Meds  Medication Sig  . aspirin 81 MG tablet Take 81 mg by mouth daily.   . clopidogrel (PLAVIX) 75 MG tablet TAKE 1 TABLET (75 MG TOTAL) BY MOUTH DAILY.  . cyclobenzaprine (FLEXERIL) 10 MG tablet TAKE 1 TABLET (10 MG TOTAL) BY MOUTH AT BEDTIME.  Marland Kitchen  EPINEPHrine 0.15 MG/0.15ML IJ injection Inject 0.15 mLs (0.15 mg total) into the skin as needed. Inject as directed.  . Esomeprazole Magnesium (NEXIUM 24HR PO) Take by mouth daily.  . folic acid (FOLVITE) 1 MG tablet Take 2 tablets (2 mg total) by mouth daily.  . furosemide (LASIX) 40 MG tablet Take 40 mg by mouth daily as needed for edema (When she feels like she is swelling).  . Golimumab (SIMPONI) 100 MG/ML SOAJ INJECT ONE DEVICE UNDER THE SKIN ONCE MONTHLY  . hydroxychloroquine (PLAQUENIL) 200 MG tablet TAKE 1 TABLET BY MOUTH TWICE DAILY.  Marland Kitchen insulin lispro (HUMALOG) 100 UNIT/ML injection FOR USE IN INSULIN PUMP. TOTAL DAILY INSULIN DOSE = UP TO 90 UNITS.  Marland Kitchen isosorbide mononitrate (IMDUR) 60 MG  24 hr tablet Take 1 tablet (60 mg total) by mouth daily.  Marland Kitchen leflunomide (ARAVA) 20 MG tablet TAKE 1 TABLET BY MOUTH ONCE DAILY  . losartan (COZAAR) 50 MG tablet TAKE 1 TABLET (50 MG TOTAL) BY MOUTH DAILY.  . metoprolol tartrate (LOPRESSOR) 50 MG tablet Take 1 tablet (50 mg total) by mouth 2 (two) times daily.  . montelukast (SINGULAIR) 10 MG tablet TAKE 1 TABLET BY MOUTH AT BEDTIME  . nabumetone (RELAFEN) 750 MG tablet TAKE 1 TABLET BY MOUTH TWICE DAILY  . Omega-3 Fatty Acids (FISH OIL) 1200 MG CAPS Take 1,200 mg by mouth 2 (two) times daily.  . ONE TOUCH ULTRA TEST test strip   . potassium chloride (K-DUR) 10 MEQ tablet Take 1 tablet (10 mEq total) by mouth daily.  . rosuvastatin (CRESTOR) 20 MG tablet Take 1 tablet (20 mg total) by mouth daily.  . [DISCONTINUED] isosorbide mononitrate (IMDUR) 60 MG 24 hr tablet Take 1 tablet (60 mg total) by mouth daily.  . [DISCONTINUED] metoprolol tartrate (LOPRESSOR) 50 MG tablet Take 1.5 tablets (75 mg total) by mouth 2 (two) times daily.  . [DISCONTINUED] rosuvastatin (CRESTOR) 10 MG tablet TAKE 1 TABLET (10 MG TOTAL) BY MOUTH DAILY.     Allergies:   Prochlorperazine; Ramipril; Shellfish-derived products; Atorvastatin; Compazine  [prochlorperazine edisylate]; Etanercept; Infliximab; Iohexol; Orencia [abatacept]; Shellfish allergy; Tofacitinib; Prochlorperazine edisylate; Amiodarone; and Rituximab   Social History   Socioeconomic History  . Marital status: Legally Separated    Spouse name: Not on file  . Number of children: Not on file  . Years of education: Not on file  . Highest education level: Not on file  Occupational History  . Not on file  Social Needs  . Financial resource strain: Not on file  . Food insecurity:    Worry: Not on file    Inability: Not on file  . Transportation needs:    Medical: Not on file    Non-medical: Not on file  Tobacco Use  . Smoking status: Never Smoker  . Smokeless tobacco: Never Used  Substance and  Sexual Activity  . Alcohol use: Yes    Comment: rarely 1 every 6 months  . Drug use: No  . Sexual activity: Not Currently    Partners: Male    Birth control/protection: None  Lifestyle  . Physical activity:    Days per week: Not on file    Minutes per session: Not on file  . Stress: Not on file  Relationships  . Social connections:    Talks on phone: Not on file    Gets together: Not on file    Attends religious service: Not on file    Active member of club or organization: Not on file  Attends meetings of clubs or organizations: Not on file    Relationship status: Not on file  Other Topics Concern  . Not on file  Social History Narrative   Divorced. Has 2 kids(fraternal twins) daughters age 58. Works at [employer], Never smoked, denies ETOH, no drugs. Drinks diet coke. No exercise.         Family History: The patient's family history includes Colon polyps in her father; Depression in an other family member; Diabetes in an other family member; Heart disease in an other family member; Hyperlipidemia in an other family member; Hypertension in an other family member; Migraines in an other family member; Stroke in her paternal grandmother. There is no history of Heart attack, Colon cancer, Stomach cancer, or Esophageal cancer.  ROS:   Please see the history of present illness.    Positive for leg swelling, easy bruising, excessive fatigue, chest pressure, leg pain, irregular heartbeats, joint swelling.  All other systems reviewed and are negative.  EKGs/Labs/Other Studies Reviewed:    The following studies were reviewed today: Myocardial Perfusion Scan 06-06-2018: Study Highlights    Probable normal perfusion and soft tissue attenuation Diaphragm No significant ischemia or scar  Nuclear stress EF: 62%.  This is a low risk study.    Nuclear History and Indications   History and Indications Indication for Stress Test: Evaluation of extent and severity of  coronary artery disease, clearance for back injections History: Prior imaging studies 2014, CABG 2010 Cardiac Risk Factors: Hypertension and IDDM Type 1  Symptoms: Chest Pain  Stress Findings   ECG Baseline ECG exhibits normal sinus rhythm.Baseline ECG indicates non-specific ST-T wave changes. .  Stress Findings A pharmacological stress test was performed using IV Lexiscan 0.4mg  over 10 seconds performed without concurrent submaximal exercise.  The patient reported nausea and vomiting during the stress test. 150mg  dose of IV aminophylline given for symptom relief approximately 2 minutes after stress.  Test was stopped per protocol.   Recovery time: 5 minutes.  Response to Stress There was no ST segment deviation noted during stress.  Arrhythmias during stress: none.  Arrhythmias during recovery: none.  There were no significant arrhythmias noted during the test.  Stress Measurements   Baseline Vitals  Rest HR 83 bpm    Rest BP 144/78 mmHg    Peak Stress Vitals  Peak HR 127 bpm    Peak BP 149/62 mmHg       Nuclear Stress Measurements   LV sys vol 15 mL    TID 1.07     LV dias vol 41 mL    LHR 0.44     SSS 6     SRS 8     SDS 1          Nuclear Stress Findings   Isotope administration Rest isotope was administered with an IV injection of 10.7 mCi Tc54m Tetrofosmin. Rest SPECT images were obtained approximately 45 minutes post tracer injection. Stress isotope was administered with an IV injection of 31.4 mCi Tc73m Tetrofosmin Images were obtained approximately 60 minutes post injection. Stress SPECT images were obtained approximately 60 minutes post tracer injection.  Nuclear Study Quality Overall image quality is good. Diaphragmatic attenuation artifact was present. Image quality affected due to significant extracardiac activity.  Nuclear Measurements Study was gated.  Rest Perfusion There is a defect present in the basal inferoseptal location.  Stress Perfusion  There is a defect present in the basal inferoseptal and mid inferoseptal location.  Overall Study Impression  Myocardial perfusion is normal. Probable normal perfusion and soft tissue attenuation Diaphragm No significant ischemia or scar This is a low risk study. Overall left ventricular systolic function was normal. LV cavity size is normal. Nuclear stress EF: 62%. The left ventricular ejection fraction is normal (55-65%). There are no significant changes in comparison to the prior study.   2D Echo 06/10/2014: Study Conclusions  - Left ventricle: The cavity size was normal. Wall thickness was normal. Systolic function was vigorous. The estimated ejection fraction was in the range of 65% to 70%. - Aortic valve: There was trivial regurgitation.  Cardiac Catheterization 05/08/2016: Conclusion   1. Severe double vessel CAD s/p 3V CABG 2. The LAD has diffuse severe proximal stenosis. There is both antegrade flow down the LAD and retrograde filling from the patent LIMA graft. The Diagonal that was supplied by the vein graft is small in caliber. The vein graft to the diagonal is occluded.  3. The Circumflex is small in caliber and has mild plaque disease. The intermediate branch is large in caliber. The intermediate branch has a proximal 30% stenosis, slightly hypodense in appearance but with excellent flow down the vessel. (FFR of this vessel is 0.91 with infusion of adenosine suggesting no significant flow obstruction). It is unclear if this represents a non-flow limiting plaque rupture or hypodense calcific lesion.  4. The RCA is previously stented in the mid segment. The mid stented segment is completely occluded. The vein graft to the PDA and posterolateral branches is patent.   Recommendations: She presented with continuous chest pain for 48 hours with no objective evidence of ischemia (negative troponin, no ischemic EKG changes). No culprit lesion is noted. As above, the mildly hypodense plaque  in the intermediate branch could represent plaque rupture without flow obstruction but FFR is in a normal range. I do not think this lesion is causing her chest pain given the excellent flow down the vessel. I will treat with ASA, Plavix in case of plaque rupture and will start Imdur. Consider GI related cause of her chest pain.     EKG:  EKG is not ordered today.    Recent Labs: 11/19/2018: TSH 0.010 02/03/2019: ALT 22; BUN 8; Creat 0.76; Hemoglobin 12.2; Platelets 190; Potassium 4.2; Sodium 140  Recent Lipid Panel    Component Value Date/Time   CHOL 151 05/08/2016 0412   TRIG 101 05/08/2016 0412   HDL 69 05/08/2016 0412   CHOLHDL 2.2 05/08/2016 0412   VLDL 20 05/08/2016 0412   LDLCALC 62 05/08/2016 0412    Physical Exam:    VS:  BP (!) 112/52   Pulse 69   Ht 5\' 3"  (1.6 m)   Wt 172 lb 3.2 oz (78.1 kg)   SpO2 93%   BMI 30.50 kg/m     Wt Readings from Last 3 Encounters:  03/04/19 172 lb 3.2 oz (78.1 kg)  01/12/19 169 lb (76.7 kg)  08/28/18 180 lb (81.6 kg)     GEN:  Well nourished, well developed in no acute distress HEENT: Normal NECK: No JVD; No carotid bruits LYMPHATICS: No lymphadenopathy CARDIAC: RRR, no murmurs, rubs, gallops RESPIRATORY:  Clear to auscultation without rales, wheezing or rhonchi  ABDOMEN: Soft, non-tender, non-distended MUSCULOSKELETAL:  Trace bilateral pretibial edema; No deformity  SKIN: Warm and dry NEUROLOGIC:  Alert and oriented x 3 PSYCHIATRIC:  Normal affect   ASSESSMENT:    1. Hyperlipidemia LDL goal <70   2. Shortness of breath   3. Edema, unspecified type  PLAN:    In order of problems listed above:  1. We reviewed the patient's most recent lipid panel which demonstrated an LDL cholesterol above goal.  I recommended that she increase rosuvastatin to 20 mg daily and repeat lipids and LFTs in 8 to 12 weeks. 2. Unclear etiology.  She also has some leg edema.  Could be related to diastolic dysfunction/diastolic heart failure.  I  recommended an echocardiogram. 3. Continue furosemide.  Check echocardiogram. 4. The patient appears stable without symptoms of angina.  I reviewed her most recent heart catheterization result.  She will continue on aspirin and clopidogrel.  Her antianginal program includes isosorbide and metoprolol.   Medication Adjustments/Labs and Tests Ordered: Current medicines are reviewed at length with the patient today.  Concerns regarding medicines are outlined above.  Orders Placed This Encounter  Procedures  . Hepatic function panel  . Lipid panel  . ECHOCARDIOGRAM COMPLETE   Meds ordered this encounter  Medications  . isosorbide mononitrate (IMDUR) 60 MG 24 hr tablet    Sig: Take 1 tablet (60 mg total) by mouth daily.    Dispense:  30 tablet    Refill:  11  . metoprolol tartrate (LOPRESSOR) 50 MG tablet    Sig: Take 1 tablet (50 mg total) by mouth 2 (two) times daily.    Dispense:  180 tablet    Refill:  3    Dose change  . rosuvastatin (CRESTOR) 20 MG tablet    Sig: Take 1 tablet (20 mg total) by mouth daily.    Dispense:  90 tablet    Refill:  3    Patient Instructions  Medication Instructions:  1) DECREASE METOPROLOL to 50 mg TWICE DAILY 2) INCREASE CRESTOR to 20 mg daily If you need a refill on your cardiac medications before your next appointment, please call your pharmacy.   Lab work: Your provider recommends that you return for FASTING lab work in: 3 months.   If you have labs (blood work) drawn today and your tests are completely normal, you will receive your results only by: Marland Kitchen MyChart Message (if you have MyChart) OR . A paper copy in the mail If you have any lab test that is abnormal or we need to change your treatment, we will call you to review the results.  Testing/Procedures: Your provider has requested that you have an echocardiogram. Echocardiography is a painless test that uses sound waves to create images of your heart. It provides your doctor with  information about the size and shape of your heart and how well your heart's chambers and valves are working. This procedure takes approximately one hour. There are no restrictions for this procedure.  Follow-Up: At [facility], you and your health needs are our priority.  As part of our continuing mission to provide you with exceptional heart care, we have created designated Provider Care Teams.  These Care Teams include your primary Cardiologist (physician) and Advanced Practice Providers (APPs -  Physician Assistants and Nurse Practitioners) who all work together to provide you with the care you need, when you need it. You will need a follow up appointment in:  12 months.  Please call our office 2 months in advance to schedule this appointment.  You may see Sherren Mocha, MD or one of the following Advanced Practice Providers on your designated Care Team: Richardson Dopp, PA-C [place], [place] . Daune Perch, NP    Signed, Sherren Mocha, MD  03/04/2019 4:12 PM    [facility]

## 2019-03-04 NOTE — Patient Instructions (Signed)
Medication Instructions:  1) DECREASE METOPROLOL to 50 mg TWICE DAILY 2) INCREASE CRESTOR to 20 mg daily If you need a refill on your cardiac medications before your next appointment, please call your pharmacy.   Lab work: Your provider recommends that you return for FASTING lab work in: 3 months.   If you have labs (blood work) drawn today and your tests are completely normal, you will receive your results only by: Marland Kitchen MyChart Message (if you have MyChart) OR . A paper copy in the mail If you have any lab test that is abnormal or we need to change your treatment, we will call you to review the results.  Testing/Procedures: Your provider has requested that you have an echocardiogram. Echocardiography is a painless test that uses sound waves to create images of your heart. It provides your doctor with information about the size and shape of your heart and how well your heart's chambers and valves are working. This procedure takes approximately one hour. There are no restrictions for this procedure.  Follow-Up: At [facility], you and your health needs are our priority.  As part of our continuing mission to provide you with exceptional heart care, we have created designated Provider Care Teams.  These Care Teams include your primary Cardiologist (physician) and Advanced Practice Providers (APPs -  Physician Assistants and Nurse Practitioners) who all work together to provide you with the care you need, when you need it. You will need a follow up appointment in:  12 months.  Please call our office 2 months in advance to schedule this appointment.  You may see Sherren Mocha, MD or one of the following Advanced Practice Providers on your designated Care Team: Richardson Dopp, PA-C [place], [place] . Daune Perch, NP

## 2019-03-09 ENCOUNTER — Encounter: Payer: Self-pay | Admitting: Physician Assistant

## 2019-03-09 ENCOUNTER — Ambulatory Visit (INDEPENDENT_AMBULATORY_CARE_PROVIDER_SITE_OTHER): Payer: 59 | Admitting: Physician Assistant

## 2019-03-09 VITALS — BP 144/65 | HR 72 | Temp 98.7°F | Ht 63.0 in | Wt 170.0 lb

## 2019-03-09 DIAGNOSIS — J0140 Acute pansinusitis, unspecified: Secondary | ICD-10-CM

## 2019-03-09 DIAGNOSIS — Z9189 Other specified personal risk factors, not elsewhere classified: Secondary | ICD-10-CM | POA: Diagnosis not present

## 2019-03-09 DIAGNOSIS — Z87892 Personal history of anaphylaxis: Secondary | ICD-10-CM | POA: Diagnosis not present

## 2019-03-09 DIAGNOSIS — Z889 Allergy status to unspecified drugs, medicaments and biological substances status: Secondary | ICD-10-CM

## 2019-03-09 MED ORDER — FLUCONAZOLE 150 MG PO TABS: 150.0000 mg | ORAL_TABLET | Freq: Once | ORAL | 0 refills | Status: AC

## 2019-03-09 MED ORDER — AMOXICILLIN-POT CLAVULANATE 875-125 MG PO TABS: 1.0000 | ORAL_TABLET | Freq: Two times a day (BID) | ORAL | 0 refills | Status: DC

## 2019-03-09 MED ORDER — EPINEPHRINE 0.15 MG/0.15ML IJ SOAJ: 0.1500 mg | INTRAMUSCULAR | 1 refills | Status: DC | PRN

## 2019-03-09 MED ORDER — EPINEPHRINE 0.3 MG/0.3ML IJ SOAJ: 0.3000 mg | INTRAMUSCULAR | 99 refills | Status: DC | PRN

## 2019-03-09 NOTE — Progress Notes (Signed)
Subjective:    Patient ID: [name], female    DOB: 1966-04-05, 53 y.o.   MRN: [id number]  HPI  Patient is a 53 year old female with type 1 diabetes and rheumatoid arthritis who presents to the clinic with sinus pressure, facial pain, headache, nasal congestion.  She has had the symptoms for at least 2 weeks.  She is tried Qnasl, Afrin, Tylenol Cold sinus severe.  Symptomatic care has made tolerable but not improving.  She denies any fever, chills, body aches.  She is not having any recent travel.  She has a dry cough.  Pt request epi pen refill.   .. Active Ambulatory Problems    Diagnosis Date Noted  . HYPERTHYROIDISM 11/09/2010  . DIABETIC  RETINOPATHY 04/25/2009  . HYPERLIPIDEMIA-MIXED 04/26/2009  . MIGRAINE W/O AURA W/INTRACT W/STATUS MIGRAINOSUS 02/19/2008  . CARPAL TUNNEL SYNDROME, BILATERAL 04/25/2009  . ACUT MI ANTEROLAT WALL SUBSQT EPIS CARE 04/26/2009  . ANGINA, STABLE/EXERTIONAL 06/02/2009  . CAD, ARTERY BYPASS GRAFT 06/07/2009  . ATRIAL FIBRILLATION 07/20/2009  . SINUS TACHYCARDIA 11/08/2010  . ALLERGIC RHINITIS, SEASONAL 10/16/2006  . CHOLELITHIASIS 10/16/2006  . DERMATITIS, ALLERGIC 07/20/2010  . Rheumatoid arthritis ([place]) 10/16/2006  . TRIGGER FINGER 04/25/2009  . DIZZINESS 11/14/2009  . EDEMA 07/12/2010  . Chest pain 04/26/2009  . Hypertension, essential 05/08/2016  . IDDM (insulin dependent diabetes mellitus) ([place]) 05/08/2016  . Coronary artery disease involving native coronary artery of native heart with angina pectoris ([place])   . Graves disease 02/01/2017  . Sleep difficulties 02/01/2017  . No energy 02/01/2017  . Osteopenia 02/01/2017  . Nasal congestion 03/15/2017  . Abnormal CT scan 06/19/2017  . Gastroesophageal reflux disease 06/19/2017  . Antiplatelet or antithrombotic long-term use 06/19/2017  . Vitamin D deficiency 07/04/2017  . B12 deficiency 07/04/2017  . Cough 06/26/2018  . PND (post-nasal drip) 06/26/2018  . H/O multiple allergies  03/10/2019  . Hx of anaphylaxis 03/10/2019   Resolved Ambulatory Problems    Diagnosis Date Noted  . DIABETES MELLITUS, TYPE I 10/16/2006  . COMMON MIGRAINE 01/24/2007  . ESSENTIAL HYPERTENSION 10/16/2006  . Acute maxillary sinusitis 10/16/2010  . URI 01/24/2007  . Shortness of breath 11/08/2010  . Diarrhea 11/08/2010  . Chest pain syndrome 05/08/2016  . Hyperlipidemia, mixed 05/08/2016  . Sinus pressure 03/15/2017  . Headache around the eyes 03/15/2017  . Cecum mass 05/22/2017  . RLQ abdominal pain 06/19/2017   Past Medical History:  Diagnosis Date  . ARTHRITIS, RHEUMATOID   . Back pain   . Contrast media allergy   . Hiatal hernia   . Insulin pump in place     Review of Systems See HPI>     Objective:   Physical Exam Vitals signs reviewed.  HENT:     Head: Normocephalic.     Right Ear: Tympanic membrane normal.     Left Ear: Tympanic membrane normal.     Nose: Congestion present.  Eyes:     Conjunctiva/sclera: Conjunctivae normal.  Cardiovascular:     Rate and Rhythm: Normal rate and regular rhythm.     Pulses: Normal pulses.  Pulmonary:     Effort: Pulmonary effort is normal.     Breath sounds: Normal breath sounds.  Neurological:     General: No focal deficit present.     Mental Status: She is alert and oriented to person, place, and time.  Psychiatric:        Mood and Affect: Mood normal.  Assessment & Plan:  .Marland KitchenMeshell was seen today for sinus problem.  Diagnoses and all orders for this visit:  Acute non-recurrent pansinusitis -     amoxicillin-clavulanate (AUGMENTIN) 875-125 MG tablet; Take 1 tablet by mouth 2 (two) times daily.  H/O multiple allergies -     EPINEPHrine 0.3 mg/0.3 mL IJ SOAJ injection; Inject 0.3 mLs (0.3 mg total) into the muscle as needed for anaphylaxis.  Hx of anaphylaxis -     EPINEPHrine 0.3 mg/0.3 mL IJ SOAJ injection; Inject 0.3 mLs (0.3 mg total) into the muscle as needed for anaphylaxis.  Other orders -      Discontinue: EPINEPHrine 0.15 MG/0.15ML IJ injection; Inject 0.15 mLs (0.15 mg total) into the skin as needed. Inject as directed. -     fluconazole (DIFLUCAN) 150 MG tablet; Take 1 tablet (150 mg total) by mouth once for 1 dose. -     Discontinue: EPINEPHrine 0.15 MG/0.15ML IJ injection; Inject 0.15 mLs (0.15 mg total) into the skin as needed. Inject as directed. -     Discontinue: EPINEPHrine 0.3 mg/0.3 mL IJ SOAJ injection; Inject 0.3 mLs (0.3 mg total) into the muscle as needed for anaphylaxis.   Due to timeline treated for sinusitis with augmentin. Encouraged to use flonase as well at home. HO given. Follow up as needed.  dilfucan for yeast after abx use.

## 2019-03-10 ENCOUNTER — Telehealth: Payer: Self-pay | Admitting: Physician Assistant

## 2019-03-10 ENCOUNTER — Encounter: Payer: Self-pay | Admitting: Physician Assistant

## 2019-03-10 DIAGNOSIS — Z87892 Personal history of anaphylaxis: Secondary | ICD-10-CM | POA: Insufficient documentation

## 2019-03-10 DIAGNOSIS — Z9189 Other specified personal risk factors, not elsewhere classified: Secondary | ICD-10-CM | POA: Insufficient documentation

## 2019-03-10 DIAGNOSIS — Z889 Allergy status to unspecified drugs, medicaments and biological substances status: Secondary | ICD-10-CM | POA: Insufficient documentation

## 2019-03-10 NOTE — Telephone Encounter (Signed)
Can we abstract her hga1c and foot exam from [facility].

## 2019-03-11 ENCOUNTER — Ambulatory Visit (HOSPITAL_COMMUNITY): Payer: 59 | Attending: Cardiovascular Disease

## 2019-03-11 ENCOUNTER — Other Ambulatory Visit: Payer: Self-pay

## 2019-03-11 DIAGNOSIS — R609 Edema, unspecified: Secondary | ICD-10-CM | POA: Diagnosis not present

## 2019-03-11 DIAGNOSIS — R0602 Shortness of breath: Secondary | ICD-10-CM | POA: Diagnosis not present

## 2019-03-12 ENCOUNTER — Other Ambulatory Visit: Payer: Self-pay | Admitting: Rheumatology

## 2019-03-12 NOTE — Telephone Encounter (Signed)
Last Visit: 01/12/19 Next visit: 07/13/19 Labs: 02/03/19 Total protein borderline low. Rest of labs are WNL.  Okay to refill per Dr. Estanislado Pandy

## 2019-03-13 MED FILL — SIMPONI 100 MG/ML SOAJ: 100 | 30 days supply | Qty: 1 | Fill #1

## 2019-03-18 DIAGNOSIS — M79642 Pain in left hand: Secondary | ICD-10-CM | POA: Diagnosis not present

## 2019-03-18 DIAGNOSIS — M65331 Trigger finger, right middle finger: Secondary | ICD-10-CM | POA: Diagnosis not present

## 2019-03-18 DIAGNOSIS — M79641 Pain in right hand: Secondary | ICD-10-CM | POA: Diagnosis not present

## 2019-03-20 DIAGNOSIS — E0500 Thyrotoxicosis with diffuse goiter without thyrotoxic crisis or storm: Secondary | ICD-10-CM | POA: Diagnosis not present

## 2019-03-20 DIAGNOSIS — E10319 Type 1 diabetes mellitus with unspecified diabetic retinopathy without macular edema: Secondary | ICD-10-CM | POA: Diagnosis not present

## 2019-04-02 ENCOUNTER — Encounter: Payer: Self-pay | Admitting: Rheumatology

## 2019-04-02 ENCOUNTER — Encounter: Payer: Self-pay | Admitting: *Deleted

## 2019-04-02 ENCOUNTER — Telehealth: Payer: Self-pay | Admitting: Rheumatology

## 2019-04-02 NOTE — Telephone Encounter (Signed)
Patient left a voicemail requesting a return call regarding her medication and a note for high risk patients.

## 2019-04-03 NOTE — Telephone Encounter (Signed)
Spoke with patient and advised that we have received her message via my chart and will talk with Dr. Estanislado Pandy about filling out the Provider attestation form.

## 2019-04-06 ENCOUNTER — Ambulatory Visit (INDEPENDENT_AMBULATORY_CARE_PROVIDER_SITE_OTHER): Payer: 59 | Admitting: Pharmacist

## 2019-04-06 ENCOUNTER — Encounter: Payer: Self-pay | Admitting: Pharmacist

## 2019-04-06 ENCOUNTER — Other Ambulatory Visit: Payer: Self-pay

## 2019-04-06 DIAGNOSIS — Z79899 Other long term (current) drug therapy: Secondary | ICD-10-CM

## 2019-04-06 NOTE — Progress Notes (Signed)
   S: Patient presents today to [facility].  Patient is currently taking Simponi for rheumatoid arthritis. Patient is managed by Dr. Estanislado Pandy for this.   Adherence: denies any missed doses  Efficacy: reports that this is the only thing that has worked for her. She has failed multiple other drugs.   Dosing:  Rheumatoid arthritis: SubQ: 50 mg once a month (in combination with methotrexate)  Drug-drug interactions: Arava (leflunomide) and Humira: Immunosuppressants may enhance the adverse/toxic effect of Leflunomide. Specifically, the risk for hematologic toxicity such as pancytopenia, agranulocytosis, and/or thrombocytopenia may be increased. Last CBC good and is closely monitored.   Screening: TB test: completed per patient Hepatitis: completed per patient  Monitoring: S/sx of infection: denies CBC WNL 01/2019 S/sx of hypersensitivity: denies S/sx of malignancy: denies S/sx of heart failure: denies S/sx of autoimmune disorder: denies  Patient reports she received a letter about Simponi not being covered by our insurance anymore and wants to know what she needs to do.  O:     Lab Results  Component Value Date   WBC 4.6 02/03/2019   HGB 12.2 02/03/2019   HCT 36.0 02/03/2019   MCV 83.1 02/03/2019   PLT 190 02/03/2019      Chemistry      Component Value Date/Time   NA 140 02/03/2019 1600   NA 142 07/16/2016   K 4.2 02/03/2019 1600   CL 105 02/03/2019 1600   CO2 26 02/03/2019 1600   BUN 8 02/03/2019 1600   BUN 7 07/16/2016   CREATININE 0.76 02/03/2019 1600   GLU 119 07/16/2016      Component Value Date/Time   CALCIUM 9.1 02/03/2019 1600   ALKPHOS 65 11/05/2018 1613   AST 22 02/03/2019 1600   ALT 22 02/03/2019 1600   BILITOT 0.3 02/03/2019 1600       A/P: 1. Medication review: Patient currently on Simponi for RA and is tolerating it well with no adverse effects. Review of her notes from rheumatology say  that there is consideration of switching to another med in the first since she still has a lot of pain but patient plans to stay on Simponi for now. Reviewed the medication with her, including the following: Simponi, golimumab, is a TNF blocker.  There is an increased risk of infection and malignancy with this medication. Do not give patients live vaccinations while they are on this medication. No recommendations for changes. Will follow up with [name], our pharmacist with our benefits team, and see what the letter about the Simponi was about and then let the patient know.   Christella Hartigan, PharmD, BCPS, BCACP, CPP Clinical Pharmacist Practitioner  [phone]

## 2019-04-07 DIAGNOSIS — E1065 Type 1 diabetes mellitus with hyperglycemia: Secondary | ICD-10-CM | POA: Diagnosis not present

## 2019-04-07 DIAGNOSIS — E1039 Type 1 diabetes mellitus with other diabetic ophthalmic complication: Secondary | ICD-10-CM | POA: Diagnosis not present

## 2019-04-16 ENCOUNTER — Other Ambulatory Visit: Payer: Self-pay | Admitting: Rheumatology

## 2019-04-16 NOTE — Telephone Encounter (Signed)
Last Visit: 01/12/19 Next visit: 07/13/19 Labs:2/4/20Total protein borderline low. Rest of labs are WNL. PLQ Eye Exam: 01/06/19 WNL   Okay to refill per Dr. Estanislado Pandy

## 2019-04-20 MED FILL — SIMPONI 100 MG/ML SOAJ: 100 | 30 days supply | Qty: 1 | Fill #2

## 2019-04-27 ENCOUNTER — Ambulatory Visit (INDEPENDENT_AMBULATORY_CARE_PROVIDER_SITE_OTHER): Payer: 59 | Admitting: Physician Assistant

## 2019-04-27 ENCOUNTER — Encounter: Payer: Self-pay | Admitting: Physician Assistant

## 2019-04-27 VITALS — BP 136/81 | HR 78 | Temp 97.9°F | Ht 63.0 in | Wt 171.0 lb

## 2019-04-27 DIAGNOSIS — G43011 Migraine without aura, intractable, with status migrainosus: Secondary | ICD-10-CM | POA: Diagnosis not present

## 2019-04-27 MED ORDER — TOPIRAMATE ER 100 MG PO CAP24: 1.0000 | ORAL_CAPSULE | Freq: Every day | ORAL | 2 refills | Status: DC

## 2019-04-27 MED ORDER — BACLOFEN 10 MG PO TABS: 10.0000 mg | ORAL_TABLET | Freq: Every day | ORAL | 5 refills | Status: DC

## 2019-04-27 NOTE — Progress Notes (Signed)
Subjective:    Patient ID: [name], female    DOB: 25-Aug-1966, 53 y.o.   MRN: [id number]  HPI  Pt is a 53 yo female with IDDM, RA, graves disease and migraines who presents to the clinic to follow up on migraine that has last for over 2 months. She thought she had a sinus infection that was causing the migraines back in march. She was treated and sinus pressure got better but migraine persisted. Her migraine is in typical fashion from frontal bilateral radiating to the back. She does get nauseated but no vomiting. She is light sensitive and a little blurry. She has had a headache every day this month. On flexeril. She has not been on topamax in years. Seemed to work last times she was on. No speech slurred. No weakness of extremities.   .. Active Ambulatory Problems    Diagnosis Date Noted  . HYPERTHYROIDISM 11/09/2010  . DIABETIC  RETINOPATHY 04/25/2009  . HYPERLIPIDEMIA-MIXED 04/26/2009  . MIGRAINE W/O AURA W/INTRACT W/STATUS MIGRAINOSUS 02/19/2008  . CARPAL TUNNEL SYNDROME, BILATERAL 04/25/2009  . ACUT MI ANTEROLAT WALL SUBSQT EPIS CARE 04/26/2009  . ANGINA, STABLE/EXERTIONAL 06/02/2009  . CAD, ARTERY BYPASS GRAFT 06/07/2009  . ATRIAL FIBRILLATION 07/20/2009  . SINUS TACHYCARDIA 11/08/2010  . ALLERGIC RHINITIS, SEASONAL 10/16/2006  . CHOLELITHIASIS 10/16/2006  . DERMATITIS, ALLERGIC 07/20/2010  . Rheumatoid arthritis ([place]) 10/16/2006  . TRIGGER FINGER 04/25/2009  . DIZZINESS 11/14/2009  . EDEMA 07/12/2010  . Chest pain 04/26/2009  . Hypertension, essential 05/08/2016  . IDDM (insulin dependent diabetes mellitus) ([place]) 05/08/2016  . Coronary artery disease involving native coronary artery of native heart with angina pectoris ([place])   . Graves disease 02/01/2017  . Sleep difficulties 02/01/2017  . No energy 02/01/2017  . Osteopenia 02/01/2017  . Nasal congestion 03/15/2017  . Abnormal CT scan 06/19/2017  . Gastroesophageal reflux disease 06/19/2017  . Antiplatelet or  antithrombotic long-term use 06/19/2017  . Vitamin D deficiency 07/04/2017  . B12 deficiency 07/04/2017  . Cough 06/26/2018  . PND (post-nasal drip) 06/26/2018  . H/O multiple allergies 03/10/2019  . Hx of anaphylaxis 03/10/2019   Resolved Ambulatory Problems    Diagnosis Date Noted  . DIABETES MELLITUS, TYPE I 10/16/2006  . COMMON MIGRAINE 01/24/2007  . ESSENTIAL HYPERTENSION 10/16/2006  . Acute maxillary sinusitis 10/16/2010  . URI 01/24/2007  . Shortness of breath 11/08/2010  . Diarrhea 11/08/2010  . Chest pain syndrome 05/08/2016  . Hyperlipidemia, mixed 05/08/2016  . Sinus pressure 03/15/2017  . Headache around the eyes 03/15/2017  . Cecum mass 05/22/2017  . RLQ abdominal pain 06/19/2017   Past Medical History:  Diagnosis Date  . ARTHRITIS, RHEUMATOID   . Back pain   . Contrast media allergy   . Hiatal hernia   . Insulin pump in place       Review of Systems See hpi    Objective:   Physical Exam Vitals signs reviewed.  Constitutional:      Appearance: Normal appearance.  HENT:     Head: Normocephalic.     Right Ear: Tympanic membrane normal.     Left Ear: Tympanic membrane normal.     Nose: Nose normal.  Eyes:     Pupils: Pupils are equal, round, and reactive to light.  Cardiovascular:     Rate and Rhythm: Normal rate and regular rhythm.  Pulmonary:     Effort: Pulmonary effort is normal.     Breath sounds: Normal breath sounds.  Abdominal:  General: Abdomen is flat.  Neurological:     General: No focal deficit present.     Mental Status: She is alert and oriented to person, place, and time.  Psychiatric:        Mood and Affect: Mood normal.           Assessment & Plan:  .Marland KitchenMakina was seen today for migraine.  Diagnoses and all orders for this visit:  MIGRAINE W/O AURA W/INTRACT W/STATUS MIGRAINOSUS -     baclofen (LIORESAL) 10 MG tablet; Take 1 tablet (10 mg total) by mouth at bedtime. -     Topiramate ER (TROKENDI XR) 100 MG CP24;  Take 1 tablet by mouth daily.  hx of migraines. Unsure of what triggered recent worsening.  Replace flexeril with baclofen at bedtime.  Start Trokendi. Given coupon card. Discussed side effects.  Follow up in 1 months.  Discussed red flag symptoms. If have these need MrI.

## 2019-04-27 NOTE — Patient Instructions (Signed)
Migraine Headache  A migraine headache is an intense, throbbing pain on one side or both sides of the head. Migraines may also cause other symptoms, such as nausea, vomiting, and sensitivity to light and noise.  What are the causes?  Doing or taking certain things may also trigger migraines, such as:  · Alcohol.  · Smoking.  · Medicines, such as:  ? Medicine used to treat chest pain (nitroglycerine).  ? Birth control pills.  ? Estrogen pills.  ? Certain blood pressure medicines.  · Aged cheeses, chocolate, or caffeine.  · Foods or drinks that contain nitrates, glutamate, aspartame, or tyramine.  · Physical activity.  Other things that may trigger a migraine include:  · Menstruation.  · Pregnancy.  · Hunger.  · Stress, lack of sleep, too much sleep, or fatigue.  · Weather changes.  What increases the risk?  The following factors may make you more likely to experience migraine headaches:  · Age. Risk increases with age.  · Family history of migraine headaches.  · Being Caucasian.  · Depression and anxiety.  · Obesity.  · Being a woman.  · Having a hole in the heart (patent foramen ovale) or other heart problems.  What are the signs or symptoms?  The main symptom of this condition is pulsating or throbbing pain. Pain may:  · Happen in any area of the head, such as on one side or both sides.  · Interfere with daily activities.  · Get worse with physical activity.  · Get worse with exposure to bright lights or loud noises.  Other symptoms may include:  · Nausea.  · Vomiting.  · Dizziness.  · General sensitivity to bright lights, loud noises, or smells.  Before you get a migraine, you may get warning signs that a migraine is developing (aura). An aura may include:  · Seeing flashing lights or having blind spots.  · Seeing bright spots, halos, or zigzag lines.  · Having tunnel vision or blurred vision.  · Having numbness or a tingling feeling.  · Having trouble talking.  · Having muscle weakness.  How is this diagnosed?  A  migraine headache can be diagnosed based on:  · Your symptoms.  · A physical exam.  · Tests, such as CT scan or MRI of the head. These imaging tests can help rule out other causes of headaches.  · Taking fluid from the spine (lumbar puncture) and analyzing it (cerebrospinal fluid analysis, or CSF analysis).  How is this treated?  A migraine headache is usually treated with medicines that:  · Relieve pain.  · Relieve nausea.  · Prevent migraines from coming back.  Treatment may also include:  · Acupuncture.  · Lifestyle changes like avoiding foods that trigger migraines.  Follow these instructions at home:  Medicines  · Take over-the-counter and prescription medicines only as told by your health care provider.  · Do not drive or use heavy machinery while taking prescription pain medicine.  · To prevent or treat constipation while you are taking prescription pain medicine, your health care provider may recommend that you:  ? Drink enough fluid to keep your urine clear or pale yellow.  ? Take over-the-counter or prescription medicines.  ? Eat foods that are high in fiber, such as fresh fruits and vegetables, whole grains, and beans.  ? Limit foods that are high in fat and processed sugars, such as fried and sweet foods.  Lifestyle  · Avoid alcohol use.  · Do   not use any products that contain nicotine or tobacco, such as cigarettes and e-cigarettes. If you need help quitting, ask your health care provider.  · Get at least 8 hours of sleep every night.  · Limit your stress.  General instructions         · Keep a journal to find out what may trigger your migraine headaches. For example, write down:  ? What you eat and drink.  ? How much sleep you get.  ? Any change to your diet or medicines.  · If you have a migraine:  ? Avoid things that make your symptoms worse, such as bright lights.  ? It may help to lie down in a dark, quiet room.  ? Do not drive or use heavy machinery.  ? Ask your health care provider what  activities are safe for you while you are experiencing symptoms.  · Keep all follow-up visits as told by your health care provider. This is important.  Contact a health care provider if:  · You develop symptoms that are different or more severe than your usual migraine symptoms.  Get help right away if:  · Your migraine becomes severe.  · You have a fever.  · You have a stiff neck.  · You have vision loss.  · Your muscles feel weak or like you cannot control them.  · You start to lose your balance often.  · You develop trouble walking.  · You faint.  This information is not intended to replace advice given to you by your health care provider. Make sure you discuss any questions you have with your health care provider.  Document Released: 12/17/2005 Document Revised: 07/06/2016 Document Reviewed: 06/04/2016  Elsevier Interactive Patient Education © 2019 [employer]

## 2019-04-29 ENCOUNTER — Encounter: Payer: Self-pay | Admitting: Physician Assistant

## 2019-04-30 ENCOUNTER — Telehealth: Payer: Self-pay

## 2019-04-30 NOTE — Telephone Encounter (Signed)
Patient left msg stating that medication she was given Monday needs to be changed. Called pt and no answer, asked her to leave more detail about which medication and was going on. Direct call back info provided

## 2019-04-30 NOTE — Telephone Encounter (Signed)
Patient left another message stating "call me back"  Called pt back and she states that side effects are too much for her to handle. States that with Trokendi she was unable to spell words this morning, and feels that the baclofen makes her dizzy and unable to function all day. States she even tried half a tablet but side effects were still too bad.   Requesting both medications be changed.

## 2019-05-01 ENCOUNTER — Encounter: Payer: Self-pay | Admitting: Physician Assistant

## 2019-05-01 ENCOUNTER — Other Ambulatory Visit: Payer: Self-pay | Admitting: Physician Assistant

## 2019-05-01 MED ORDER — GALCANEZUMAB-GNLM 120 MG/ML ~~LOC~~ SOAJ: 240.0000 mg | SUBCUTANEOUS | 0 refills | Status: DC

## 2019-05-01 MED ORDER — GALCANEZUMAB-GNLM 120 MG/ML ~~LOC~~ SOAJ: 120.0000 mg | SUBCUTANEOUS | 1 refills | Status: DC

## 2019-05-01 NOTE — Telephone Encounter (Signed)
Pt left msg today stating she forgot her cell at home, asking her return call be made to her work number [phone]

## 2019-05-01 NOTE — Progress Notes (Signed)
Stop trokendi due to side effects.  Start emgality. Discussed how to load with 2 injections then 1 injection every 30 days.

## 2019-05-06 DIAGNOSIS — M65331 Trigger finger, right middle finger: Secondary | ICD-10-CM | POA: Diagnosis not present

## 2019-05-06 DIAGNOSIS — M65332 Trigger finger, left middle finger: Secondary | ICD-10-CM | POA: Diagnosis not present

## 2019-05-11 ENCOUNTER — Ambulatory Visit: Payer: Self-pay | Admitting: Physician Assistant

## 2019-05-15 ENCOUNTER — Other Ambulatory Visit: Payer: Self-pay | Admitting: Rheumatology

## 2019-05-15 ENCOUNTER — Other Ambulatory Visit: Payer: Self-pay | Admitting: Cardiovascular Disease

## 2019-05-15 DIAGNOSIS — I10 Essential (primary) hypertension: Secondary | ICD-10-CM

## 2019-05-15 MED FILL — HYDROXYCHLOROQUINE 200 MG: 200 | 30 days supply | Qty: 60 | Fill #0

## 2019-05-15 MED FILL — NABUMETONE 750 MG TABS: 750 | 30 days supply | Qty: 60 | Fill #0

## 2019-05-15 MED FILL — FOLIC ACID 1 MG TABS: 1 | 90 days supply | Qty: 180 | Fill #0

## 2019-05-15 MED FILL — ISOSORBIDE MN ER 60 MG TAB: 60 | 30 days supply | Qty: 30 | Fill #0

## 2019-05-15 MED FILL — METOPROLOL TARTRATE 50 MG T: 50 | 90 days supply | Qty: 180 | Fill #0

## 2019-05-15 MED FILL — CONTOUR NEXT STRIPS: 30 days supply | Qty: 300 | Fill #0

## 2019-05-15 MED FILL — MONTELUKAST SOD 10 MG TAB: 10 | 30 days supply | Qty: 30 | Fill #0

## 2019-05-15 MED FILL — HumaLOG 100 UNIT/ML SOLN: 100 | 30 days supply | Qty: 30 | Fill #0

## 2019-05-15 MED FILL — LOSARTAN POTASSIUM 50 MG TA: 50 | 30 days supply | Qty: 30 | Fill #0

## 2019-05-15 MED FILL — LEFLUNOMIDE 20 MG TABLET: 20 | 30 days supply | Qty: 30 | Fill #0

## 2019-05-15 MED FILL — CYCLOBENZAPRINE HCL 10 MG T: 10 | 30 days supply | Qty: 30 | Fill #0

## 2019-05-15 MED FILL — CLOPIDOGREL 75 MG TABLET: 75 | 90 days supply | Qty: 90 | Fill #0

## 2019-05-15 MED FILL — ROSUVASTATIN CALCIUM 20 MG: 20 | 90 days supply | Qty: 90 | Fill #0

## 2019-05-15 MED FILL — methIMAzole 10 MG TABS: 10 | 30 days supply | Qty: 60 | Fill #0

## 2019-05-15 NOTE — Telephone Encounter (Signed)
Last Visit: 01/12/19 Next visit: 07/13/19 Labs:2/4/20Total protein borderline low. Rest of labs are WNL. TB Gold: 08/18/18 Neg   Patient advised she is due to update labs. Patient will update next week.   Okay to refill 30 day supply per Dr. Estanislado Pandy

## 2019-05-18 ENCOUNTER — Other Ambulatory Visit: Payer: Self-pay | Admitting: Pharmacist

## 2019-05-18 MED ORDER — GOLIMUMAB 100 MG/ML ~~LOC~~ SOAJ: SUBCUTANEOUS | 0 refills | Status: DC

## 2019-05-19 ENCOUNTER — Other Ambulatory Visit: Payer: Self-pay | Admitting: *Deleted

## 2019-05-21 ENCOUNTER — Other Ambulatory Visit: Payer: Self-pay

## 2019-05-21 DIAGNOSIS — Z79899 Other long term (current) drug therapy: Secondary | ICD-10-CM

## 2019-05-22 ENCOUNTER — Telehealth: Payer: Self-pay | Admitting: Physician Assistant

## 2019-05-22 LAB — COMPLETE METABOLIC PANEL WITH GFR
AG Ratio: 1.8 (calc) (ref 1.0–2.5)
ALT: 18 U/L (ref 6–29)
AST: 21 U/L (ref 10–35)
Albumin: 4.2 g/dL (ref 3.6–5.1)
Alkaline phosphatase (APISO): 129 U/L (ref 37–153)
BUN: 9 mg/dL (ref 7–25)
CO2: 27 mmol/L (ref 20–32)
Calcium: 9.3 mg/dL (ref 8.6–10.4)
Chloride: 104 mmol/L (ref 98–110)
Creat: 0.88 mg/dL (ref 0.50–1.05)
GFR, Est African American: 88 mL/min/{1.73_m2} (ref >=60)
GFR, Est Non African American: 76 mL/min/{1.73_m2} (ref >=60)
Globulin: 2.3 g/dL (calc) (ref 1.9–3.7)
Glucose, Bld: 88 mg/dL (ref 65–99)
Potassium: 3.8 mmol/L (ref 3.5–5.3)
Sodium: 141 mmol/L (ref 135–146)
Total Bilirubin: 0.5 mg/dL (ref 0.2–1.2)
Total Protein: 6.5 g/dL (ref 6.1–8.1)

## 2019-05-22 LAB — CBC WITH DIFFERENTIAL/PLATELET
Absolute Monocytes: 517 cells/uL (ref 200–950)
Basophils Absolute: 52 cells/uL (ref 0–200)
Basophils Relative: 1.1 %
Eosinophils Absolute: 221 cells/uL (ref 15–500)
Eosinophils Relative: 4.7 %
HCT: 38.6 % (ref 35.0–45.0)
Hemoglobin: 13.5 g/dL (ref 11.7–15.5)
Lymphs Abs: 1777 cells/uL (ref 850–3900)
MCH: 29.4 pg (ref 27.0–33.0)
MCHC: 35 g/dL (ref 32.0–36.0)
MCV: 84.1 fL (ref 80.0–100.0)
MPV: 9.7 fL (ref 7.5–12.5)
Monocytes Relative: 11 %
Neutro Abs: 2134 cells/uL (ref 1500–7800)
Neutrophils Relative %: 45.4 %
Platelets: 172 10*3/uL (ref 140–400)
RBC: 4.59 10*6/uL (ref 3.80–5.10)
RDW: 13.4 % (ref 11.0–15.0)
Total Lymphocyte: 37.8 %
WBC: 4.7 10*3/uL (ref 3.8–10.8)

## 2019-05-22 NOTE — Telephone Encounter (Signed)
Pt called to advise the Rx for [facility] requires PA. Routing.

## 2019-05-26 ENCOUNTER — Telehealth: Payer: 59 | Admitting: Physician Assistant

## 2019-05-26 NOTE — Telephone Encounter (Signed)
Information has been sent to insurance and waiting on a response.   

## 2019-05-27 NOTE — Telephone Encounter (Signed)
Received an approval from insurance and pharmacy is aware.

## 2019-06-01 ENCOUNTER — Telehealth: Payer: Self-pay | Admitting: Pharmacist

## 2019-06-01 NOTE — Telephone Encounter (Signed)
Received a Prior Authorization request from [facility] for [facility]. Authorization has been submitted to patient's insurance via Cover My Meds. Will update once we receive a response.  PA Case ID: [id number]  Mariella Saa, PharmD, BCACP, CPP Rheumatology Clinical Pharmacist  06/01/2019 11:42 AM

## 2019-06-02 NOTE — Telephone Encounter (Signed)
Received a fax from [facility] regarding a prior authorization for [facility]. Authorization has been APPROVED from 06/01/2019 to 05/30/2020.   Will send document to scan center.  Authorization # [id number] Phone # [phone]

## 2019-06-11 ENCOUNTER — Encounter: Payer: Self-pay | Admitting: Physician Assistant

## 2019-06-12 NOTE — Telephone Encounter (Signed)
Last A1c already abstracted. I don't see a foot exam that was performed.

## 2019-06-17 ENCOUNTER — Other Ambulatory Visit: Payer: Self-pay | Admitting: Physician Assistant

## 2019-06-17 ENCOUNTER — Other Ambulatory Visit: Payer: Self-pay | Admitting: Rheumatology

## 2019-06-17 NOTE — Telephone Encounter (Signed)
Last Visit: 01/12/2019 Next Visit: 07/14/2019 Labs: 05/21/2019 CBC and CMP are WNL  Okay to refill per Dr. Estanislado Pandy.

## 2019-06-17 NOTE — Telephone Encounter (Signed)
Received notification from pharmacy that [place] is still requiring a Prior Authorization. Called [employer] and they approved 0.35ml of the 100mg  Autoinjector, not the whole pen. Rep sent a message to the prior authorization team. And I was advised to follow up tomorrow.  Will follow in the morning.  Phone# [phone]   3:41 PM Beatriz Chancellor, CPhT

## 2019-06-17 NOTE — Telephone Encounter (Addendum)
Received fax from [facility] long outpatient pharmacy stating that Simponi was still requiring a prior authorization and the patient has been without her medication for over a month.  Apolonio Schneiders, patient advocate, called the insurance company in the PA was processed for the incorrect amount. They are currently working to correct the prior authorization.  Called to inform patient of the issue and that she may pick up a sample of the medication in the meantime.  Advised patient that she can call us if this happens again as we are able to provide samples and do not want her to go without her medication if possible.  Patient verbalized understanding.  Patient states she called our office and will call was not returned.  Apologized on the office behalf.  All questions encouraged and answered.  Instructed patient to call with any further questions or concerns.  Mariella Saa, PharmD, [facility] Rheumatology Clinical Pharmacist  06/17/2019 3:37 PM

## 2019-06-18 NOTE — Telephone Encounter (Signed)
Called [employer] again to follow up since the Simponi is still not going through. Rep again said they are working to update the authorization for the correct dosage. Was advised to try to reprocess in 1 hour and to call back if it is not fixed.  1:34 PM Beatriz Chancellor, CPhT

## 2019-06-20 MED FILL — SIMPONI 100 MG/ML SOAJ: 100 | 28 days supply | Qty: 1 | Fill #0

## 2019-06-22 NOTE — Telephone Encounter (Signed)
Plan corrected prior authorization. Claim processed successfully on Saturday, 06/20/19. Medication will mail out to patient 06/22/19.  8:05 AM Beatriz Chancellor, CPhT

## 2019-07-01 DIAGNOSIS — E1039 Type 1 diabetes mellitus with other diabetic ophthalmic complication: Secondary | ICD-10-CM | POA: Diagnosis not present

## 2019-07-01 DIAGNOSIS — E1065 Type 1 diabetes mellitus with hyperglycemia: Secondary | ICD-10-CM | POA: Diagnosis not present

## 2019-07-01 NOTE — Progress Notes (Signed)
Office Visit Note  Patient: [name]             Date of Birth: 26-Sep-1966           MRN: [id number]             PCP: Lavada Mesi Referring: Donella Stade, PA-C Visit Date: 07/14/2019 Occupation: @GUAROCC @  Subjective:  Left hip joint pain     History of Present Illness: [name] is a 53 y.o. female with history of seronegative rheumatoid arthritis.  She is on Simponi 100 mg sq every 28 days, Arava 20 mg 1 tablet po daily, and Plaquenil 200 mg 1 tablet BID. She takes Relafen 750 mg BID as needed for pain relief and flexeril 10 mg po at bedtime.  She presents today with increased pain and swelling in bilateral hands.  She states that she recently missed 1 dose of Simponi and has been having increased arthralgias and joint pain since then. She states that she followed up with Dr. Fredna Dow last week and had a right middle trigger finger cortisone injection.  The injection resolved the locking and tenderness.  She states that in the past 3 weeks she has fallen 3 times.  She states she is having severe left hip joint pain and would like a x-ray today.  She states that her first fall she missed a stair and her second fall she slipped off the ladder.  She is unsure how she fell the third time.  She denies any dizziness or lightheadedness recently.  She states that her fatigue has been worsening and she has been feeling more weak.  She states she has not been "feeling like herself" lately, and thinks her thyroid is dysfunctioning due to her history of Graves disease.  She has not followed up with her PCP recently.  She continues to have psoriasis on bilateral elbows and bilateral knee joints.    Activities of Daily Living:  Patient reports morning stiffness for 1.5 hours.   Patient Reports nocturnal pain.  Difficulty dressing/grooming: Denies Difficulty climbing stairs: Denies Difficulty getting out of chair: Reports Difficulty using hands for taps, buttons, cutlery, and/or  writing: Denies  Review of Systems  Constitutional: Positive for fatigue.  HENT: Negative for mouth sores, mouth dryness and nose dryness.   Eyes: Positive for dryness. Negative for pain and visual disturbance.  Respiratory: Negative for cough, hemoptysis, shortness of breath and difficulty breathing.   Cardiovascular: Negative for chest pain, palpitations, hypertension and swelling in legs/feet.  Gastrointestinal: Positive for diarrhea. Negative for blood in stool and constipation.  Endocrine: Negative for increased urination.  Genitourinary: Negative for painful urination.  Musculoskeletal: Positive for arthralgias, joint pain, joint swelling and morning stiffness. Negative for myalgias, muscle weakness, muscle tenderness and myalgias.  Skin: Positive for rash (Psoriasis ). Negative for color change, pallor, hair loss, nodules/bumps, skin tightness, ulcers and sensitivity to sunlight.  Allergic/Immunologic: Negative for susceptible to infections.  Neurological: Negative for dizziness, numbness, headaches and weakness.  Hematological: Negative for swollen glands.  Psychiatric/Behavioral: Negative for depressed mood and sleep disturbance. The patient is not nervous/anxious.     PMFS History:  Patient Active Problem List   Diagnosis Date Noted   H/O multiple allergies 03/10/2019   Hx of anaphylaxis 03/10/2019   Cough 06/26/2018   PND (post-nasal drip) 06/26/2018   Vitamin D deficiency 07/04/2017   B12 deficiency 07/04/2017   Abnormal CT scan 06/19/2017   Gastroesophageal reflux disease 06/19/2017   Antiplatelet  or antithrombotic long-term use 06/19/2017   Nasal congestion 03/15/2017   Graves disease 02/01/2017   Sleep difficulties 02/01/2017   No energy 02/01/2017   Osteopenia 02/01/2017   Hypertension, essential 05/08/2016   IDDM (insulin dependent diabetes mellitus) ([place]) 05/08/2016   Coronary artery disease involving native coronary artery of native heart with  angina pectoris ([place])    HYPERTHYROIDISM 11/09/2010   SINUS TACHYCARDIA 11/08/2010   DERMATITIS, ALLERGIC 07/20/2010   EDEMA 07/12/2010   DIZZINESS 11/14/2009   ATRIAL FIBRILLATION 07/20/2009   CAD, ARTERY BYPASS GRAFT 06/07/2009   ANGINA, STABLE/EXERTIONAL 06/02/2009   HYPERLIPIDEMIA-MIXED 04/26/2009   ACUT MI ANTEROLAT WALL SUBSQT EPIS CARE 04/26/2009   Chest pain 04/26/2009   DIABETIC  RETINOPATHY 04/25/2009   CARPAL TUNNEL SYNDROME, BILATERAL 04/25/2009   TRIGGER FINGER 04/25/2009   MIGRAINE W/O AURA W/INTRACT W/STATUS MIGRAINOSUS 02/19/2008   ALLERGIC RHINITIS, SEASONAL 10/16/2006   CHOLELITHIASIS 10/16/2006   Rheumatoid arthritis ([place]) 10/16/2006    Past Medical History:  Diagnosis Date   ACUT MI ANTEROLAT WALL SUBSQT EPIS CARE    Acute maxillary sinusitis    ALLERGIC RHINITIS, SEASONAL    ARTHRITIS, RHEUMATOID    Atrial fibrillation ([place])    a. after CABG.   Back pain    CAD, ARTERY BYPASS GRAFT    a. DES to RCA in 2010 then LAD occlusion s/p CABG 3 06/07/2009 with LIMA to LAD, reverse SVG to D1, reverse SVG to distal RCA. b. Cath 05/08/2016 slightly hypodense region in the intermediate branch, however she had excellent flow, FFR was normal. Vein graft to PDA and the posterolateral branch is patent, patent LIMA to LAD, occluded SVG to diagonal.   CARPAL TUNNEL SYNDROME, BILATERAL    CHOLELITHIASIS    Contrast media allergy    DERMATITIS, ALLERGIC    DIABETES MELLITUS, TYPE I    on insulin pump   DIABETIC  RETINOPATHY    Hiatal hernia    HYPERLIPIDEMIA-MIXED    HYPERTHYROIDISM    Insulin pump in place    MIGRAINE W/O AURA W/INTRACT W/STATUS MIGRAINOSUS 02/19/2008   SINUS TACHYCARDIA 11/08/2010   TRIGGER FINGER    URI     Family History  Problem Relation Age of Onset   Colon polyps Father    Diabetes Other    Heart disease Other    Hypertension Other    Hyperlipidemia Other    Depression Other    Migraines Other     Stroke Paternal Grandmother    Heart attack Neg Hx    Colon cancer Neg Hx    Stomach cancer Neg Hx    Esophageal cancer Neg Hx    Past Surgical History:  Procedure Laterality Date   ABDOMINAL HYSTERECTOMY     Caesarean section     CARDIAC CATHETERIZATION N/A 05/08/2016   Procedure: Left Heart Cath and Cors/Grafts Angiography;  Surgeon: Burnell Blanks, MD;  Location: [facility];  Service: Cardiovascular;  Laterality: N/A;   CARPAL TUNNEL RELEASE     CHOLECYSTECTOMY     CORONARY ARTERY BYPASS GRAFT     VITRECTOMY     Social History   Social History Narrative   Divorced. Has 2 kids(fraternal twins) daughters age 67. Works at [employer], Never smoked, denies ETOH, no drugs. Drinks diet coke. No exercise.       Immunization History  Administered Date(s) Administered   Influenza, Quadrivalent, Recombinant, Inj, Pf 10/14/2013   Influenza, Seasonal, Injecte, Preservative Fre 09/19/2016   Influenza,inj,Quad PF,6+ Mos 09/30/2014, 09/03/2018  Influenza,inj,quad, With Preservative 09/23/2017   Influenza-Unspecified 10/13/2013, 10/04/2014, 09/30/2016   Pneumococcal Conjugate-13 07/16/2016   Pneumococcal Polysaccharide-23 01/01/2004, 10/15/2006   Td 10/01/2003   Tdap 01/09/2011, 06/25/2018     Objective: Vital Signs: BP 140/66 (BP Location: Left Arm, Patient Position: Sitting, Cuff Size: Normal)    Pulse 69    Resp 13    Ht 5\' 3"  (1.6 m)    Wt 177 lb 6.4 oz (80.5 kg)    BMI 31.42 kg/m    Physical Exam Vitals signs and nursing note reviewed.  Constitutional:      Appearance: She is well-developed.  HENT:     Head: Normocephalic and atraumatic.  Eyes:     Conjunctiva/sclera: Conjunctivae normal.  Neck:     Musculoskeletal: Normal range of motion.  Cardiovascular:     Rate and Rhythm: Normal rate and regular rhythm.     Heart sounds: Normal heart sounds.  Pulmonary:     Effort: Pulmonary effort is normal.     Breath sounds: Normal  breath sounds.  Abdominal:     General: Bowel sounds are normal.     Palpations: Abdomen is soft.  Lymphadenopathy:     Cervical: No cervical adenopathy.  Skin:    General: Skin is warm and dry.     Capillary Refill: Capillary refill takes less than 2 seconds.     Comments: Psoriasis on the extensor surface of both elbows and both knee joints   Neurological:     Mental Status: She is alert and oriented to person, place, and time.  Psychiatric:        Behavior: Behavior normal.      Musculoskeletal Exam: C-spine, thoracic spine, and lumbar spine good range of motion.  No midline spinal tenderness.  No SI joint tenderness.  Shoulder joints have good range of motion with some stiffness and discomfort.  She has mild elbow joint contractures bilaterally.  She has a limited range of motion with some discomfort of both wrist joints.  She has tenderness on the dorsal aspect of both wrists.  No extensor tenosynovitis was noted today.  She has tenderness and synovitis of several MCP and PIP joints as described below.  Right hip has good range of motion with no discomfort.  Left hip is slightly limited range of motion with some discomfort.  She has tenderness over the left trochanteric bursa.  Knee joints have good range of motion.  She has warmth of bilateral knees but no effusion was noted.  She has bilateral knee joint crepitus.  No tenderness or inflammation of her ankle joints was noted.  CDAI Exam: CDAI Score: 13.2  Patient Global: 6 mm; Provider Global: 6 mm Swollen: 6 ; Tender: 6  Joint Exam      Right  Left  MCP 2     Swollen Tender  MCP 3     Swollen Tender  MCP 4     Swollen Tender  PIP 3  Swollen Tender     Knee  Swollen Tender  Swollen Tender     Investigation: No additional findings.  Imaging: No results found.  Recent Labs: Lab Results  Component Value Date   WBC 4.7 05/21/2019   HGB 13.5 05/21/2019   PLT 172 05/21/2019   NA 141 05/21/2019   K 3.8 05/21/2019   CL  104 05/21/2019   CO2 27 05/21/2019   GLUCOSE 88 05/21/2019   BUN 9 05/21/2019   CREATININE 0.88 05/21/2019   BILITOT 0.5 05/21/2019  ALKPHOS 65 11/05/2018   AST 21 05/21/2019   ALT 18 05/21/2019   PROT 6.5 05/21/2019   ALBUMIN 4.1 11/05/2018   CALCIUM 9.3 05/21/2019   GFRAA 88 05/21/2019   QFTBGOLDPLUS NEGATIVE 07/31/2018    Speciality Comments: PLQ Eye Exam: 01/06/19 WNL @ [facility]  Prior Therapy: MTX (nausea), Morrie Sheldon (GI upset), Plaquneil (diarrhea), Orencia/Remicade/Humria/ Cimzia (inadequate response)  Procedures:  No procedures performed Allergies: Prochlorperazine, Ramipril, Shellfish-derived products, Atorvastatin, Compazine  [prochlorperazine edisylate], Etanercept, Infliximab, Iohexol, Orencia [abatacept], Shellfish allergy, Tofacitinib, Prochlorperazine edisylate, Trokendi xr [topiramate er], Amiodarone, and Rituximab   Assessment / Plan:     Visit Diagnoses: Rheumatoid arthritis of multiple sites with negative rheumatoid factor (HCC) - RF negative, anti-CCP negative, positive ANA: She is currently having a rheumatoid arthritis flare.  She has synovitis and tenderness of several MCP and PIP joints as described above.  She has warmth of bilateral knee joints.  She recently missed a dose of Simponi due to not having a refill/issues with insurance.  She has resumed Simponi 100 mg subcutaneous injections every 28 days, Arava 20 mg 1 tablet by mouth daily and Plaquenil 200 mg 1 tablet twice daily.  She has been having increased pain and stiffness in both shoulders, both hands, both wrists, both knee joints.  She requested a prednisone taper.  She will start on 20 mg and taper by 5 mg every week.  She will complete a taper taking 2.5 mg for 1 week.  She reports this taper has been effective in the past.  She is advised to monitor her blood glucose very closely.  She will adjust her insulin pump accordingly.  She does not need any refills at this time.  She was  advised to notify us if her joint pain and joint swelling returns after the prednisone taper.  She will follow-up in the office in 3 months.  High risk medication use - Simponi 100 mg every 28 days, Arava 20 mg 1 tablet daily, Plaquenil 200 mg 1 tablet twice daily.  Last Plaquenil eye exam normal on 01/06/2019.  Last TB gold negative on 07/31/2018 and will monitor yearly. Most recent CBC/CMP within normal limits on 05/21/2019.  Due for CBC/CMP in August and will monitor every 3 months.  Standing orders are in place.    She received the flu vaccine in September and is up to date with pneumonia vaccines. . - Plan: QuantiFERON-TB Gold Plus,  Psoriasis -She has psoriasis on the extensor surface of both elbow joints and both knee joints.   Other fatigue - She has been experiencing worsening fatigue recently.  She was advised to follow up with PCP and endocrinologist for further evaluation and treatment.   Other insomnia - She has been sleeping well at night.  She has been experiencing some nocturnal pain in the left hip.   Pain in left hip -According to the patient she is fallen 3 times in the past 3 weeks.  She states that her fatigue and weakness have been worsening recently.  She recently moved and was exerting a lot of physical activity.  She has not had any dizziness or lightheadedness.  She denied any loss of consciousness or head injuries.  She is having increased left hip joint pain after the most recent fall.  X-rays of the left hip were obtained today.  We will call with those results.  She does have good range of motion of the left hip on exam with some discomfort.  She has tenderness over the left trochanteric bursa.  Plan: XR HIP UNILAT W OR W/O PELVIS 2-3 VIEWS LEFT   Osteopenia of multiple sites - DEXA 02/08/17: Normal-The BMD measured at Femur Neck Left is 0.959 g/cm2 with a T-score of -0.6.She will be due for repeat DEXA 5 years after last DEXA.   History of Graves' disease - She is followed by  an endocrinologist. She has recently become more symptomatic. She will follow up for evaluation.    History of diabetes mellitus - She is on insulin. She was advised to monitor her blood glucose closely while taking prednisone.  She reports she has tolerated prednisone in the past and will adjust her insulin pump accordingly.   Other medical conditions are listed as follows:   History of vitamin D deficiency   Antiplatelet or antithrombotic long-term use - Secondary to coronary artery disease-Plavix.  History of gastroesophageal reflux (GERD)    History of migraine   History of coronary artery disease   History of hypertension   History of hyperlipidemia     Orders: Orders Placed This Encounter  Procedures   XR HIP UNILAT W OR W/O PELVIS 2-3 VIEWS LEFT   QuantiFERON-TB Gold Plus   Meds ordered this encounter  Medications   predniSONE (DELTASONE) 5 MG tablet    Sig: Take 4 tablets po daily x1wk, 3 tablets po daily x1wk, 2 tablets po daily x1wk, 1 tablet po daily x1wk, 1/2 tablet po daily x1wk    Dispense:  74 tablet    Refill:  0    Face-to-face time spent with patient was 30 minutes. Greater than 50% of time was spent in counseling and coordination of care.  Follow-Up Instructions: Return in about 3 months (around 10/14/2019) for Rheumatoid arthritis.   Ofilia Neas, PA-C  Note - This record has been created using Dragon software.  Chart creation errors have been sought, but may not always  have been located. Such creation errors do not reflect on  the standard of medical care.

## 2019-07-06 DIAGNOSIS — M65331 Trigger finger, right middle finger: Secondary | ICD-10-CM | POA: Diagnosis not present

## 2019-07-07 ENCOUNTER — Other Ambulatory Visit: Payer: Self-pay | Admitting: Rheumatology

## 2019-07-07 NOTE — Telephone Encounter (Signed)
Last Visit: 01/12/2019 Next Visit: 07/14/2019  Okay to refill per Dr. Deveshwar.  

## 2019-07-07 NOTE — Telephone Encounter (Signed)
Last Visit: 01/12/2019 Next Visit: 07/14/2019 Labs: 05/21/2019 CBC and CMP are WNL TB Gold: 07/31/18 Neg   Okay to refill per Dr. Estanislado Pandy

## 2019-07-08 ENCOUNTER — Other Ambulatory Visit: Payer: Self-pay | Admitting: Pharmacist

## 2019-07-08 MED ORDER — SIMPONI 100 MG/ML ~~LOC~~ SOAJ: SUBCUTANEOUS | 0 refills | Status: DC

## 2019-07-13 ENCOUNTER — Ambulatory Visit: Payer: Self-pay | Admitting: Rheumatology

## 2019-07-14 ENCOUNTER — Ambulatory Visit (INDEPENDENT_AMBULATORY_CARE_PROVIDER_SITE_OTHER): Payer: 59

## 2019-07-14 ENCOUNTER — Ambulatory Visit: Payer: 59 | Admitting: Physician Assistant

## 2019-07-14 ENCOUNTER — Other Ambulatory Visit: Payer: Self-pay

## 2019-07-14 ENCOUNTER — Encounter: Payer: Self-pay | Admitting: Physician Assistant

## 2019-07-14 VITALS — BP 140/66 | HR 69 | Resp 13 | Ht 63.0 in | Wt 177.4 lb

## 2019-07-14 DIAGNOSIS — G4709 Other insomnia: Secondary | ICD-10-CM

## 2019-07-14 DIAGNOSIS — Z8669 Personal history of other diseases of the nervous system and sense organs: Secondary | ICD-10-CM

## 2019-07-14 DIAGNOSIS — M25552 Pain in left hip: Secondary | ICD-10-CM

## 2019-07-14 DIAGNOSIS — Z79899 Other long term (current) drug therapy: Secondary | ICD-10-CM | POA: Diagnosis not present

## 2019-07-14 DIAGNOSIS — Z8679 Personal history of other diseases of the circulatory system: Secondary | ICD-10-CM

## 2019-07-14 DIAGNOSIS — M8589 Other specified disorders of bone density and structure, multiple sites: Secondary | ICD-10-CM

## 2019-07-14 DIAGNOSIS — R5383 Other fatigue: Secondary | ICD-10-CM

## 2019-07-14 DIAGNOSIS — Z8719 Personal history of other diseases of the digestive system: Secondary | ICD-10-CM

## 2019-07-14 DIAGNOSIS — M0609 Rheumatoid arthritis without rheumatoid factor, multiple sites: Secondary | ICD-10-CM

## 2019-07-14 DIAGNOSIS — Z7902 Long term (current) use of antithrombotics/antiplatelets: Secondary | ICD-10-CM | POA: Diagnosis not present

## 2019-07-14 DIAGNOSIS — Z8639 Personal history of other endocrine, nutritional and metabolic disease: Secondary | ICD-10-CM

## 2019-07-14 DIAGNOSIS — L409 Psoriasis, unspecified: Secondary | ICD-10-CM | POA: Diagnosis not present

## 2019-07-14 MED ORDER — PREDNISONE 5 MG PO TABS: ORAL_TABLET | ORAL | 0 refills | Status: DC

## 2019-07-14 NOTE — Patient Instructions (Addendum)
Standing Labs We placed an order today for your standing lab work.    Please come back and get your standing labs in August and every 3 months   TB gold, CBC, and CMP   We have open lab daily Monday through Thursday from 8:30-12:30 PM and 1:30-4:30 PM and Friday from 8:30-12:30 PM and 1:30 -4:00 PM at the office of Dr. Bo Merino.   You may experience shorter wait times on Monday and Friday afternoons. The office is located at [address], [place], [place], [place] [address] No appointment is necessary.   Labs are drawn by [facility].  You may receive a bill from [facility] for your lab work.  If you wish to have your labs drawn at another location, please call the office 24 hours in advance to send orders.  If you have any questions regarding directions or hours of operation,  please call [phone].   Just as a reminder please drink plenty of water prior to coming for your lab work. Thanks!

## 2019-07-15 ENCOUNTER — Telehealth: Payer: Self-pay | Admitting: Physician Assistant

## 2019-07-15 NOTE — Telephone Encounter (Signed)
I called the patient this morning to review x-rays of the left hip from 07/14/19.  All questions were addressed.  Treatment options were discussed. She was advised to notify us if she develops new or worsening symptoms.   Lisa Sams, PA-C

## 2019-07-16 MED FILL — SIMPONI 100 MG/ML SOAJ: 100 | 28 days supply | Qty: 1 | Fill #0

## 2019-07-17 ENCOUNTER — Other Ambulatory Visit: Payer: Self-pay | Admitting: Physician Assistant

## 2019-07-17 ENCOUNTER — Other Ambulatory Visit: Payer: Self-pay | Admitting: Rheumatology

## 2019-07-17 NOTE — Telephone Encounter (Signed)
Last Visit: 07/14/2019 Next Visit: 10/13/2019 Labs: 05/21/2019 CBC and CMP are WNL  Okay to refill per Dr. Estanislado Pandy.

## 2019-07-20 ENCOUNTER — Telehealth: Payer: Self-pay | Admitting: Neurology

## 2019-07-20 DIAGNOSIS — G43011 Migraine without aura, intractable, with status migrainosus: Secondary | ICD-10-CM

## 2019-07-20 NOTE — Telephone Encounter (Signed)
Ok agree. Will you please add to intolerance list with rash/swelling around injection site.   Did it help with her headaches?   Is she interested in trying another medication similar? Called aimovig?

## 2019-07-20 NOTE — Telephone Encounter (Signed)
Patient left vm for [place] to call about her emgality.   She states she took the first two loading doses and had some itching around the injection site, but this went away.   When she took her latest injection one week ago she developed swelling and welts at area of injection. This hasn't gone away. She is also on prednisone, so worried what the reaction would be if she wasn't. She wants to stop medication. Please advise next steps.

## 2019-07-20 NOTE — Telephone Encounter (Signed)
Left message on machine for patient to call back.  To discuss [name]'s message with patient.

## 2019-07-20 NOTE — Telephone Encounter (Signed)
Call pt: let her know this is still in the "mab" family and she has had reactions to now 3 mabs concerned it is a class effect.   I would hate for your next reaction to be more severe.   What about headache clinic referral? Maybe botox?

## 2019-07-20 NOTE — Telephone Encounter (Signed)
Emgality added to medication list.   Patient states it did help with Migraines. She would like to switch to Aimovig. RX pended. See allergy contraindications.

## 2019-07-21 NOTE — Telephone Encounter (Signed)
Spoke with patient. She is agreeable to see Neurology to discuss migraines/botox. Referral entered.   [place].

## 2019-07-23 ENCOUNTER — Telehealth: Payer: Self-pay | Admitting: Rheumatology

## 2019-07-23 NOTE — Telephone Encounter (Signed)
Patient calling stating her lt hip is still hurting, and patient would like to know whom she would need to go see next for this? Please call to advise.

## 2019-07-23 NOTE — Telephone Encounter (Signed)
Please place referral to ortho for further evaluation and treatment.  She may require a MRI of the left hip joint.

## 2019-07-24 ENCOUNTER — Ambulatory Visit (INDEPENDENT_AMBULATORY_CARE_PROVIDER_SITE_OTHER): Payer: 59 | Admitting: Orthopaedic Surgery

## 2019-07-24 ENCOUNTER — Encounter: Payer: Self-pay | Admitting: Orthopaedic Surgery

## 2019-07-24 DIAGNOSIS — S7002XA Contusion of left hip, initial encounter: Secondary | ICD-10-CM | POA: Diagnosis not present

## 2019-07-24 NOTE — Telephone Encounter (Signed)
Patient has an appointment with Dr. Lorin Mercy 07/24/19.

## 2019-07-24 NOTE — Progress Notes (Signed)
Office Visit Note   Patient: [name]           Date of Birth: 1966-08-28           MRN: [id number] Visit Date: 07/24/2019              Requested by: Donella Stade, PA-C [place],  [place] [address] PCP: Donella Stade, PA-C   Assessment & Plan: Visit Diagnoses:  1. Contusion of left hip, initial encounter     Plan: I reviewed x-rays with patient.  I will have her use a cane in her right hand she was fitted with one today.  She can continue anti-inflammatories intermittent ice.  We discussed fall prevention.  Follow-up if she has persistent problems.  Follow-Up Instructions: Return if symptoms worsen or fail to improve.   Orders:  No orders of the defined types were placed in this encounter.  No orders of the defined types were placed in this encounter.     Procedures: No procedures performed   Clinical Data: No additional findings.   Subjective: Chief Complaint  Patient presents with  . Left Leg - Pain    HPI 53 year old female fell 2-1/2 weeks ago landing on her left lateral hip and thigh when she was working on some curtains and slipped off of a short stool.  She states is gotten somewhat better but continues to have some discomfort in the mid thigh laterally on the left.  No numbness or tingling in her feet.  She does have some mid back pain.  No weakness in her ankles or leg.  She is used Relafen and took 1 hydrocodone.  She is on Plavix and also insulin.  X-rays of the pelvis and left hip obtained on 07/15/2019 showed mild superior lateral narrowing of the left hip.  Pelvis negative for fracture.  Femoral neck was normal. Review of Systems 14 point review of systems updated.  Previous cardiac bypass, atrial fib, cholelithiasis.,  Diabetic retinopathy, hypothyroidism with Graves' disease, diagnosis of rheumatoid arthritis, bilateral carpal tunnel syndrome, otherwise negative as it pertains HPI.   Objective: Vital Signs: Ht 5'  3" (1.6 m)   Wt 175 lb (79.4 kg)   BMI 31.00 kg/m   Physical Exam Constitutional:      Appearance: She is well-developed.  HENT:     Head: Normocephalic.     Right Ear: External ear normal.     Left Ear: External ear normal.  Eyes:     Pupils: Pupils are equal, round, and reactive to light.  Neck:     Thyroid: No thyromegaly.     Trachea: No tracheal deviation.  Cardiovascular:     Rate and Rhythm: Normal rate.     Comments: Median sternotomy. Pulmonary:     Effort: Pulmonary effort is normal.  Abdominal:     Palpations: Abdomen is soft.  Skin:    General: Skin is warm and dry.  Neurological:     Mental Status: She is alert and oriented to person, place, and time.  Psychiatric:        Behavior: Behavior normal.     Ortho Exam no ecchymosis over the lateral thigh.  She does have some tenderness mild swelling.  Mild tenderness of the trochanteric bursa minimal sciatic notch tenderness negative logroll to the hip she can do a straight leg raise.  She ambulates with a slight left leg limp.  Knee range of motion is full no knee  effusion collateral ligaments are stable.  Distal pulses are intact.  Specialty Comments:  No specialty comments available.  Imaging: No results found.   PMFS History: Patient Active Problem List   Diagnosis Date Noted  . Contusion of left hip 07/25/2019  . H/O multiple allergies 03/10/2019  . Hx of anaphylaxis 03/10/2019  . Cough 06/26/2018  . PND (post-nasal drip) 06/26/2018  . Vitamin D deficiency 07/04/2017  . B12 deficiency 07/04/2017  . Abnormal CT scan 06/19/2017  . Gastroesophageal reflux disease 06/19/2017  . Antiplatelet or antithrombotic long-term use 06/19/2017  . Nasal congestion 03/15/2017  . Graves disease 02/01/2017  . Sleep difficulties 02/01/2017  . No energy 02/01/2017  . Osteopenia 02/01/2017  . Hypertension, essential 05/08/2016  . IDDM (insulin dependent diabetes mellitus) ([place]) 05/08/2016  . Coronary artery  disease involving native coronary artery of native heart with angina pectoris ([place])   . HYPERTHYROIDISM 11/09/2010  . SINUS TACHYCARDIA 11/08/2010  . DERMATITIS, ALLERGIC 07/20/2010  . EDEMA 07/12/2010  . DIZZINESS 11/14/2009  . ATRIAL FIBRILLATION 07/20/2009  . CAD, ARTERY BYPASS GRAFT 06/07/2009  . ANGINA, STABLE/EXERTIONAL 06/02/2009  . HYPERLIPIDEMIA-MIXED 04/26/2009  . ACUT MI ANTEROLAT WALL SUBSQT EPIS CARE 04/26/2009  . Chest pain 04/26/2009  . DIABETIC  RETINOPATHY 04/25/2009  . CARPAL TUNNEL SYNDROME, BILATERAL 04/25/2009  . TRIGGER FINGER 04/25/2009  . MIGRAINE W/O AURA W/INTRACT W/STATUS MIGRAINOSUS 02/19/2008  . ALLERGIC RHINITIS, SEASONAL 10/16/2006  . CHOLELITHIASIS 10/16/2006  . Rheumatoid arthritis ([place]) 10/16/2006   Past Medical History:  Diagnosis Date  . ACUT MI ANTEROLAT WALL SUBSQT EPIS CARE   . Acute maxillary sinusitis   . ALLERGIC RHINITIS, SEASONAL   . ARTHRITIS, RHEUMATOID   . Atrial fibrillation ([facility])    a. after CABG.  . Back pain   . CAD, ARTERY BYPASS GRAFT    a. DES to RCA in 2010 then LAD occlusion s/p CABG 3 06/07/2009 with LIMA to LAD, reverse SVG to D1, reverse SVG to distal RCA. b. Cath 05/08/2016 slightly hypodense region in the intermediate branch, however she had excellent flow, FFR was normal. Vein graft to PDA and the posterolateral branch is patent, patent LIMA to LAD, occluded SVG to diagonal.  . CARPAL TUNNEL SYNDROME, BILATERAL   . CHOLELITHIASIS   . Contrast media allergy   . DERMATITIS, ALLERGIC   . DIABETES MELLITUS, TYPE I    on insulin pump  . DIABETIC  RETINOPATHY   . Hiatal hernia   . HYPERLIPIDEMIA-MIXED   . HYPERTHYROIDISM   . Insulin pump in place   . MIGRAINE W/O AURA W/INTRACT W/STATUS MIGRAINOSUS 02/19/2008  . SINUS TACHYCARDIA 11/08/2010  . TRIGGER FINGER   . URI     Family History  Problem Relation Age of Onset  . Colon polyps Father   . Diabetes Other   . Heart disease Other   . Hypertension Other   .  Hyperlipidemia Other   . Depression Other   . Migraines Other   . Stroke Paternal Grandmother   . Heart attack Neg Hx   . Colon cancer Neg Hx   . Stomach cancer Neg Hx   . Esophageal cancer Neg Hx     Past Surgical History:  Procedure Laterality Date  . ABDOMINAL HYSTERECTOMY    . Caesarean section    . CARDIAC CATHETERIZATION N/A 05/08/2016   Procedure: Left Heart Cath and Cors/Grafts Angiography;  Surgeon: Burnell Blanks, MD;  Location: [facility];  Service: Cardiovascular;  Laterality: N/A;  . CARPAL TUNNEL  RELEASE    . CHOLECYSTECTOMY    . CORONARY ARTERY BYPASS GRAFT    . VITRECTOMY     Social History   Occupational History  . Not on file  Tobacco Use  . Smoking status: Never Smoker  . Smokeless tobacco: Never Used  Substance and Sexual Activity  . Alcohol use: Yes    Comment: rarely 1 every 6 months  . Drug use: No  . Sexual activity: Not Currently    Partners: Male    Birth control/protection: None

## 2019-07-25 DIAGNOSIS — S7002XA Contusion of left hip, initial encounter: Secondary | ICD-10-CM | POA: Insufficient documentation

## 2019-08-06 MED FILL — SIMPONI 100 MG/ML SOAJ: 100 | 28 days supply | Qty: 1 | Fill #1

## 2019-08-10 ENCOUNTER — Other Ambulatory Visit: Payer: Self-pay | Admitting: Rheumatology

## 2019-08-10 ENCOUNTER — Other Ambulatory Visit: Payer: Self-pay | Admitting: Physician Assistant

## 2019-08-10 NOTE — Telephone Encounter (Signed)
Last Visit: 07/14/2019 Next Visit: 10/13/2019 Labs: 05/21/2019 CBC and CMP are WNL PLQ Eye Exam: 01/06/19 WNL  Okay to refill per Dr. Estanislado Pandy

## 2019-08-11 ENCOUNTER — Ambulatory Visit (INDEPENDENT_AMBULATORY_CARE_PROVIDER_SITE_OTHER): Payer: 59 | Admitting: Family Medicine

## 2019-08-11 ENCOUNTER — Encounter: Payer: Self-pay | Admitting: Family Medicine

## 2019-08-11 VITALS — Wt 175.0 lb

## 2019-08-11 DIAGNOSIS — R5383 Other fatigue: Secondary | ICD-10-CM

## 2019-08-11 DIAGNOSIS — E538 Deficiency of other specified B group vitamins: Secondary | ICD-10-CM | POA: Diagnosis not present

## 2019-08-11 DIAGNOSIS — M25552 Pain in left hip: Secondary | ICD-10-CM

## 2019-08-11 DIAGNOSIS — E0590 Thyrotoxicosis, unspecified without thyrotoxic crisis or storm: Secondary | ICD-10-CM

## 2019-08-11 DIAGNOSIS — E0500 Thyrotoxicosis with diffuse goiter without thyrotoxic crisis or storm: Secondary | ICD-10-CM

## 2019-08-11 DIAGNOSIS — E119 Type 2 diabetes mellitus without complications: Secondary | ICD-10-CM | POA: Diagnosis not present

## 2019-08-11 DIAGNOSIS — E559 Vitamin D deficiency, unspecified: Secondary | ICD-10-CM

## 2019-08-11 DIAGNOSIS — Z794 Long term (current) use of insulin: Secondary | ICD-10-CM

## 2019-08-11 DIAGNOSIS — IMO0001 Reserved for inherently not codable concepts without codable children: Secondary | ICD-10-CM

## 2019-08-11 NOTE — Patient Instructions (Signed)
Thank you for coming in today. Attend PT in [place] area. Let me know if they do not call you.  Get labs at [facility].  If no explanation for fatigue with labs next step will be home sleep study.  If hip does not improve quickly with PT we will want an in person recheck.

## 2019-08-11 NOTE — Progress Notes (Signed)
Virtual Visit  via Video Note  I connected with      [name] by a video enabled telemedicine application and verified that I am speaking with the correct person using two identifiers.   I discussed the limitations of evaluation and management by telemedicine and the availability of in person appointments. The patient expressed understanding and agreed to proceed.  History of Present Illness: [name] is a 53 y.o. female who would like to discuss fatigue and hip pain.  Patient has noted fatigue now ongoing for over a month.  She notes that she is never had good quality sleep and tends to fall asleep at about 9 PM and wake up at 5 AM.  She will typically wake up for about 30 to 60 minutes at night.  She notes that she is gained a little bit more weight and started snoring recently.  She also notes that her Graves' disease has worsened.  She has been seen by endocrinologist and her TSH was suppressed and T3-T4 was elevated when it was last checked in March.  Her methimazole dose was increased but she is not had it rechecked.  Additionally she has rheumatoid arthritis which has been typically pretty well controlled.  She denies any heart issues.  No palpitations chest pain or shortness of breath.  She notes her exertional capacity is about the same as it has been.  She also notes that she recently fell and injured her left hip.  She was on a step stool and fell landing on her side.  She had x-rays of her hip and pelvis on July 14 which did not show any fractures.  She notes continued pain in her left lateral hip and left thigh which have improved slightly.  She notes some pain when she stands from a seated position.  She notes some limping as well.  She denies radiating pain below her knee.  She denies weakness or numbness or new bowel bladder dysfunction.     Observations/Objective: Wt 175 lb (79.4 kg)   BMI 31.00 kg/m  Wt Readings from Last 5 Encounters:  08/11/19 175 lb (79.4  kg)  07/24/19 175 lb (79.4 kg)  07/14/19 177 lb 6.4 oz (80.5 kg)  04/27/19 171 lb (77.6 kg)  03/09/19 170 lb (77.1 kg)   Exam: Appearance nontoxic Normal Speech.    Lab and Radiology Results Xr Hip Unilat W Or W/o Pelvis 2-3 Views Left  Result Date: 07/15/2019 Mild superior lateral narrowing was noted.  No SI joint changes were noted.  No fracture was noted.  Right hip joint was unremarkable. Impression: These findings are consistent with mild osteoarthritis of the left hip joint.  I personally (independently) visualized and performed the interpretation of the images attached in this note.  Labs from [facility] reviewed.   Assessment and Plan: 53 y.o. female with fatigue.  Etiology unclear at this time.  Likely multifactorial.  She has several possibilities including thyroid abnormality, sleep apnea, or other metabolic issue.  Plan for limited metabolic work-up including CBC metabolic panel TSH free T3 free T4 CBC with differential and vitamin B12 given her history of vitamin B12 deficiency.  If labs are relatively normal and no obvious cause there neck step would likely be home sleep study to evaluate for possible sleep apnea.  She is snoring and feeling fatigue sleep apnea certainly possibility.  Hip pain: After fall.  X-rays initially were normal.  She has had some evaluation already with no severe  injury.  I think is likely that she has effectively trochanteric bursitis at this point.  Plan for trial of physical therapy.  If not improving will return for in person visit for further evaluation management.  May repeat x-ray to proceed with further advanced imaging at that time.  PDMP not reviewed this encounter. Orders Placed This Encounter  Procedures  . CBC with Differential/Platelet  . TSH  . T4, free  . T3, free  . Hemoglobin A1c  . CMP14+EGFR  . Vitamin B12  . Ambulatory referral to Physical Therapy    Referral Priority:   Routine    Referral Type:   Physical Medicine     Referral Reason:   Specialty Services Required    Requested Specialty:   Physical Therapy   No orders of the defined types were placed in this encounter.   Follow Up Instructions:    I discussed the assessment and treatment plan with the patient. The patient was provided an opportunity to ask questions and all were answered. The patient agreed with the plan and demonstrated an understanding of the instructions.   The patient was advised to call back or seek an in-person evaluation if the symptoms worsen or if the condition fails to improve as anticipated.  Time: 25 minutes of intraservice time, with >39 minutes of total time during today's visit.      Historical information moved to improve visibility of documentation.  Past Medical History:  Diagnosis Date  . ACUT MI ANTEROLAT WALL SUBSQT EPIS CARE   . Acute maxillary sinusitis   . ALLERGIC RHINITIS, SEASONAL   . ARTHRITIS, RHEUMATOID   . Atrial fibrillation ([place])    a. after CABG.  . Back pain   . CAD, ARTERY BYPASS GRAFT    a. DES to RCA in 2010 then LAD occlusion s/p CABG 3 06/07/2009 with LIMA to LAD, reverse SVG to D1, reverse SVG to distal RCA. b. Cath 05/08/2016 slightly hypodense region in the intermediate branch, however she had excellent flow, FFR was normal. Vein graft to PDA and the posterolateral branch is patent, patent LIMA to LAD, occluded SVG to diagonal.  . CARPAL TUNNEL SYNDROME, BILATERAL   . CHOLELITHIASIS   . Contrast media allergy   . DERMATITIS, ALLERGIC   . DIABETES MELLITUS, TYPE I    on insulin pump  . DIABETIC  RETINOPATHY   . Hiatal hernia   . HYPERLIPIDEMIA-MIXED   . HYPERTHYROIDISM   . Insulin pump in place   . MIGRAINE W/O AURA W/INTRACT W/STATUS MIGRAINOSUS 02/19/2008  . SINUS TACHYCARDIA 11/08/2010  . TRIGGER FINGER   . URI    Past Surgical History:  Procedure Laterality Date  . ABDOMINAL HYSTERECTOMY    . Caesarean section    . CARDIAC CATHETERIZATION N/A 05/08/2016   Procedure: Left  Heart Cath and Cors/Grafts Angiography;  Surgeon: Burnell Blanks, MD;  Location: [facility];  Service: Cardiovascular;  Laterality: N/A;  . CARPAL TUNNEL RELEASE    . CHOLECYSTECTOMY    . CORONARY ARTERY BYPASS GRAFT    . VITRECTOMY     Social History   Tobacco Use  . Smoking status: Never Smoker  . Smokeless tobacco: Never Used  Substance Use Topics  . Alcohol use: Yes    Comment: rarely 1 every 6 months   family history includes Colon polyps in her father; Depression in an other family member; Diabetes in an other family member; Heart disease in an other family member; Hyperlipidemia in an other  family member; Hypertension in an other family member; Migraines in an other family member; Stroke in her paternal grandmother.  Medications: Current Outpatient Medications  Medication Sig Dispense Refill  . clopidogrel (PLAVIX) 75 MG tablet TAKE 1 TABLET BY MOUTH ONCE DAILY 90 tablet 2  . cyclobenzaprine (FLEXERIL) 10 MG tablet TAKE 1 TABLET BY MOUTH AT BEDTIME. 30 tablet 0  . EPINEPHrine 0.3 mg/0.3 mL IJ SOAJ injection Inject 0.3 mLs (0.3 mg total) into the muscle as needed for anaphylaxis. 1 Device prn  . Esomeprazole Magnesium (NEXIUM 24HR PO) Take by mouth daily.    . folic acid (FOLVITE) 1 MG tablet Take 2 tablets (2 mg total) by mouth daily. 180 tablet 3  . furosemide (LASIX) 40 MG tablet Take 40 mg by mouth daily as needed for edema (When she feels like she is swelling).    . Golimumab (SIMPONI) 100 MG/ML SOAJ INJECT ONE DEVICE UNDER THE SKIN ONCE MONTHLY 3 mL 0  . hydroxychloroquine (PLAQUENIL) 200 MG tablet TAKE 1 TABLET BY MOUTH TWICE DAILY. 180 tablet 0  . insulin lispro (HUMALOG) 100 UNIT/ML injection FOR USE IN INSULIN PUMP. TOTAL DAILY INSULIN DOSE = UP TO 90 UNITS.    Marland Kitchen isosorbide mononitrate (IMDUR) 60 MG 24 hr tablet Take 1 tablet (60 mg total) by mouth daily. 30 tablet 11  . leflunomide (ARAVA) 20 MG tablet TAKE 1 TABLET BY MOUTH ONCE DAILY 30 tablet 0  .  losartan (COZAAR) 50 MG tablet TAKE 1 TABLET BY MOUTH ONCE DAILY 90 tablet 2  . methimazole (TAPAZOLE) 10 MG tablet Take by mouth.    . metoprolol tartrate (LOPRESSOR) 50 MG tablet Take 1 tablet (50 mg total) by mouth 2 (two) times daily. 180 tablet 3  . montelukast (SINGULAIR) 10 MG tablet TAKE 1 TABLET BY MOUTH AT BEDTIME 30 tablet 2  . nabumetone (RELAFEN) 750 MG tablet TAKE 1 TABLET BY MOUTH TWICE DAILY 60 tablet 0  . Omega-3 Fatty Acids (FISH OIL) 1200 MG CAPS Take 1,200 mg by mouth 2 (two) times daily.    . ONE TOUCH ULTRA TEST test strip   1  . potassium chloride (K-DUR) 10 MEQ tablet Take 1 tablet (10 mEq total) by mouth daily. (Patient taking differently: 10 mEq as needed. ) 90 tablet 3  . rosuvastatin (CRESTOR) 20 MG tablet Take 1 tablet (20 mg total) by mouth daily. 90 tablet 3  . predniSONE (DELTASONE) 5 MG tablet Take 4 tablets po daily x1wk, 3 tablets po daily x1wk, 2 tablets po daily x1wk, 1 tablet po daily x1wk, 1/2 tablet po daily x1wk 74 tablet 0   No current facility-administered medications for this visit.    Allergies  Allergen Reactions  . Prochlorperazine Rash    Neuro problems per pt Neurological reaction  . Ramipril Swelling, Rash and Other (See Comments)  . Shellfish-Derived Products Swelling    Shrimp(Facial swelling) Shrimp(Facial swelling)  . Atorvastatin Rash    Elevated LFT's Elevated LFT's Elevated LFT's  . Compazine  [Prochlorperazine Edisylate] Other (See Comments)    Neurological reaction  . Etanercept Swelling and Rash  . Infliximab Rash  . Iohexol     Iv contrast dye -rash all over  . Orencia [Abatacept] Rash  . Shellfish Allergy Swelling    Shrimp(Facial swelling)  . Tofacitinib Rash and Other (See Comments)    Severe abdominal pain Severe abdominal pain Severe abdominal pain  . Emgality [Galcanezumab-Gnlm] Hives and Swelling  . Prochlorperazine Edisylate     unknown  .  [employer]]     [employer], memory issues, word  finding issues  . Amiodarone Nausea Only  . Rituximab Rash    Causes a rash

## 2019-08-12 ENCOUNTER — Other Ambulatory Visit
Admission: RE | Admit: 2019-08-12 | Discharge: 2019-08-12 | Disposition: A | Discharge status: Home / Self Care | Payer: 59 | Source: Ambulatory Visit | Attending: Family Medicine | Admitting: Family Medicine

## 2019-08-12 DIAGNOSIS — E0500 Thyrotoxicosis with diffuse goiter without thyrotoxic crisis or storm: Secondary | ICD-10-CM | POA: Insufficient documentation

## 2019-08-12 DIAGNOSIS — E538 Deficiency of other specified B group vitamins: Secondary | ICD-10-CM | POA: Insufficient documentation

## 2019-08-12 DIAGNOSIS — Z794 Long term (current) use of insulin: Secondary | ICD-10-CM | POA: Diagnosis not present

## 2019-08-12 DIAGNOSIS — E0590 Thyrotoxicosis, unspecified without thyrotoxic crisis or storm: Secondary | ICD-10-CM | POA: Diagnosis not present

## 2019-08-12 DIAGNOSIS — R5383 Other fatigue: Secondary | ICD-10-CM | POA: Insufficient documentation

## 2019-08-12 DIAGNOSIS — M25552 Pain in left hip: Secondary | ICD-10-CM | POA: Insufficient documentation

## 2019-08-12 DIAGNOSIS — E119 Type 2 diabetes mellitus without complications: Secondary | ICD-10-CM | POA: Diagnosis not present

## 2019-08-12 DIAGNOSIS — E559 Vitamin D deficiency, unspecified: Secondary | ICD-10-CM | POA: Diagnosis not present

## 2019-08-12 LAB — CBC WITH DIFFERENTIAL/PLATELET
Abs Immature Granulocytes: 0.01 10*3/uL (ref 0.00–0.07)
Basophils Absolute: 0.1 10*3/uL (ref 0.0–0.1)
Basophils Relative: 1 %
Eosinophils Absolute: 0.2 10*3/uL (ref 0.0–0.5)
Eosinophils Relative: 4 %
HCT: 37.4 % (ref 36.0–46.0)
Hemoglobin: 12.7 g/dL (ref 12.0–15.0)
Immature Granulocytes: 0 %
Lymphocytes Relative: 40 %
Lymphs Abs: 2.2 10*3/uL (ref 0.7–4.0)
MCH: 30 pg (ref 26.0–34.0)
MCHC: 34 g/dL (ref 30.0–36.0)
MCV: 88.4 fL (ref 80.0–100.0)
Monocytes Absolute: 0.4 10*3/uL (ref 0.1–1.0)
Monocytes Relative: 7 %
Neutro Abs: 2.7 10*3/uL (ref 1.7–7.7)
Neutrophils Relative %: 48 %
Platelets: 145 10*3/uL — ABNORMAL LOW (ref 150–400)
RBC: 4.23 MIL/uL (ref 3.87–5.11)
RDW: 12.4 % (ref 11.5–15.5)
WBC: 5.5 10*3/uL (ref 4.0–10.5)
nRBC: 0 % (ref 0.0–0.2)

## 2019-08-12 LAB — HEMOGLOBIN A1C
Hgb A1c MFr Bld: 6.6 % — ABNORMAL HIGH (ref 4.8–5.6)
Mean Plasma Glucose: 142.72 mg/dL

## 2019-08-12 LAB — COMPREHENSIVE METABOLIC PANEL
ALT: 16 U/L (ref 0–44)
AST: 19 U/L (ref 15–41)
Albumin: 4.1 g/dL (ref 3.5–5.0)
Alkaline Phosphatase: 88 U/L (ref 38–126)
Anion gap: 11 (ref 5–15)
BUN: 10 mg/dL (ref 6–20)
CO2: 23 mmol/L (ref 22–32)
Calcium: 8.9 mg/dL (ref 8.9–10.3)
Chloride: 103 mmol/L (ref 98–111)
Creatinine, Ser: 0.71 mg/dL (ref 0.44–1.00)
GFR calc Af Amer: >60 mL/min (ref >60)
GFR calc non Af Amer: >60 mL/min (ref >60)
Glucose, Bld: 124 mg/dL — ABNORMAL HIGH (ref 70–99)
Potassium: 3.8 mmol/L (ref 3.5–5.1)
Sodium: 137 mmol/L (ref 135–145)
Total Bilirubin: 0.7 mg/dL (ref 0.3–1.2)
Total Protein: 6.9 g/dL (ref 6.5–8.1)

## 2019-08-12 LAB — VITAMIN B12: Vitamin B-12: 1222 pg/mL — ABNORMAL HIGH (ref 180–914)

## 2019-08-12 LAB — TSH: TSH: 7.635 u[IU]/mL — ABNORMAL HIGH (ref 0.350–4.500)

## 2019-08-12 LAB — T4, FREE: Free T4: 0.46 ng/dL — ABNORMAL LOW (ref 0.61–1.12)

## 2019-08-13 ENCOUNTER — Encounter: Payer: Self-pay | Admitting: Family Medicine

## 2019-08-13 LAB — T3, FREE: T3, Free: 2.7 pg/mL (ref 2.0–4.4)

## 2019-08-14 ENCOUNTER — Other Ambulatory Visit: Payer: Self-pay | Admitting: Physician Assistant

## 2019-08-14 DIAGNOSIS — Z1231 Encounter for screening mammogram for malignant neoplasm of breast: Secondary | ICD-10-CM

## 2019-08-19 ENCOUNTER — Encounter: Payer: Self-pay | Admitting: Physical Therapy

## 2019-08-19 ENCOUNTER — Ambulatory Visit: Payer: 59 | Attending: Family Medicine | Admitting: Physical Therapy

## 2019-08-19 ENCOUNTER — Other Ambulatory Visit: Payer: Self-pay

## 2019-08-19 DIAGNOSIS — M25552 Pain in left hip: Secondary | ICD-10-CM | POA: Diagnosis not present

## 2019-08-19 DIAGNOSIS — M25652 Stiffness of left hip, not elsewhere classified: Secondary | ICD-10-CM | POA: Insufficient documentation

## 2019-08-19 DIAGNOSIS — R262 Difficulty in walking, not elsewhere classified: Secondary | ICD-10-CM | POA: Diagnosis not present

## 2019-08-19 DIAGNOSIS — M6281 Muscle weakness (generalized): Secondary | ICD-10-CM | POA: Diagnosis not present

## 2019-08-19 DIAGNOSIS — E10319 Type 1 diabetes mellitus with unspecified diabetic retinopathy without macular edema: Secondary | ICD-10-CM | POA: Diagnosis not present

## 2019-08-19 DIAGNOSIS — E0500 Thyrotoxicosis with diffuse goiter without thyrotoxic crisis or storm: Secondary | ICD-10-CM | POA: Diagnosis not present

## 2019-08-19 NOTE — Therapy (Signed)
[facility] [address] [place], [address] Phone: [phone]   Fax:  [phone]  Physical Therapy Evaluation  Patient Details  Name: [name] MRN: [id number] Date of Birth: 1966/04/13 Referring Provider (PT): Gregor Hams, MD   Encounter Date: 08/19/2019  PT End of Session - 08/21/19 1043    Visit Number  1    Number of Visits  12    Date for PT Re-Evaluation  10/02/19    Authorization Type  [employer] reporting period from 08/21/2019    Authorization Time Period  Current Cert Period: 2/42/6834 - 10/02/2019    Authorization - Visit Number  1    Authorization - Number of Visits  10    PT Start Time  1645    PT Stop Time  1745    PT Time Calculation (min)  60 min    Activity Tolerance  Patient tolerated treatment well;Patient limited by pain    Behavior During Therapy  [facility] for tasks assessed/performed       Past Medical History:  Diagnosis Date  . ACUT MI ANTEROLAT WALL SUBSQT EPIS CARE   . Acute maxillary sinusitis   . ALLERGIC RHINITIS, SEASONAL   . ARTHRITIS, RHEUMATOID   . Atrial fibrillation ([place])    a. after CABG.  . Back pain   . CAD, ARTERY BYPASS GRAFT    a. DES to RCA in 2010 then LAD occlusion s/p CABG 3 06/07/2009 with LIMA to LAD, reverse SVG to D1, reverse SVG to distal RCA. b. Cath 05/08/2016 slightly hypodense region in the intermediate branch, however she had excellent flow, FFR was normal. Vein graft to PDA and the posterolateral branch is patent, patent LIMA to LAD, occluded SVG to diagonal.  . CARPAL TUNNEL SYNDROME, BILATERAL   . CHOLELITHIASIS   . Contrast media allergy   . DERMATITIS, ALLERGIC   . DIABETES MELLITUS, TYPE I    on insulin pump  . DIABETIC  RETINOPATHY   . Hiatal hernia   . HYPERLIPIDEMIA-MIXED   . HYPERTHYROIDISM   . Insulin pump in place   . MIGRAINE W/O AURA W/INTRACT W/STATUS MIGRAINOSUS 02/19/2008  . SINUS TACHYCARDIA 11/08/2010  . TRIGGER  FINGER   . URI     Past Surgical History:  Procedure Laterality Date  . ABDOMINAL HYSTERECTOMY    . Caesarean section    . CARDIAC CATHETERIZATION N/A 05/08/2016   Procedure: Left Heart Cath and Cors/Grafts Angiography;  Surgeon: Burnell Blanks, MD;  Location: [facility];  Service: Cardiovascular;  Laterality: N/A;  . CARPAL TUNNEL RELEASE    . CHOLECYSTECTOMY    . CORONARY ARTERY BYPASS GRAFT    . VITRECTOMY      There were no vitals filed for this visit.   Subjective Assessment - 08/21/19 0957    Subjective  Patient states condition started when she fell during putting up curtains over a month ago. It is getting slowly better. She missed the last step of a short step ladder and and fell right on her hip. Radiographs showed no fracture.  Took prednisone for a month for her hands and it had no effect on the hip pain. She has moved twice in the last 6 months and is new the the area now living in [place]. She lives with her two 67 year old children. She states after sititng getting up is very difficult but once she gets up and takes three steps, it  will pop real loud and then feel better, but still be painful. She continues to walk funny even after it pops. Denies any popping in the hip prior to injury. So far she has been treated with occasional iburpofen (can only take in limited amounts due to other health conditions), which mildly helps take the edge of the pain. Has underwent a month course of prednisone related to rheumatoid arthritis in her hands and it had no effect on her hip pain.    Pertinent History  [name] who presents to outpatient physical therapy with a referral for medical diagnosis left hip pain. This patient's chief complaints consist of left hip/thigh pain, weakness, stiffness, decreased function, and popping following fall off stool onto L hip while hanging curtains leading to the following functional deficits difficulty with ADLs, IADLs,  work activities, housework, Haematologist, hobbies, sitting, standing, transferring, traveling, walking, running, bending, lifting, sleeping. Relevant past medical history and comorbidities include rheumatoid arthritis, DM type I (insulin pump with port at left abdomen), graves disease currently bothering her, steroid use; History of migraines, history of MI and open heart surgery (no symptoms from that or lingering problems, attributed to complications from DMI), diabetic retinopathy (currently doing well), osteopenia (last scan was good).    Limitations  Sitting;Walking;Lifting;House hold activities;Standing;Other (comment)   difficulty with ADLs, IADLs, work activities, housework, yardwork, hobbies, sitting, standing, transferring, traveling, walking, running, bending, lifting, sleeping   How long can you sit comfortably?  sitting is worse than standing, uncomfortable    How long can you stand comfortably?  unable to stand comfortably, is able to complete work duties with pain and difficulty    How long can you walk comfortably?  unable to walk comfortably, is able to complete work duties with pain and difficulty    Diagnostic tests  L hip radiograph report 07/14/2019: "Impression: These findings are consistent with mild osteoarthritis of the left hip joint."    Patient Stated Goals  want not to hurt and be able to go through her daily life without any problems    Currently in Pain?  Yes    Pain Score  4    current pain as 4/10 sitting, 8/10 going sit to stand, at best 4/10, at worst 10/10.   Pain Location  Hip   anterior lateral L thigh from knee to into groin to latera hip almost to edge of glute max.   Pain Orientation  Left    Pain Descriptors / Indicators  Aching;Shooting    Pain Type  Acute pain    Pain Radiating Towards  over anterior and lateral L thigh towares knee. Into groin. Paresthesia: denies current and history.    Pain Onset  More than a month ago    Pain Frequency  Constant     Aggravating Factors   transitioning from sit to stand, going up stairs, putting on shoes and socks, pulling foot up and bending over hurts, getting in and out of car, bending and lifting, reaching the floor, walking (aching), getting on tip-toes to put pressure on abdomen of patient.    Pain Relieving Factors  standing is better than sitting, holding lateral quad feels good, ibuprofen helps some, no 24 hour pattern, popping.    Effect of Pain on Daily Activities  difficulty with ADLs, IADLs, work activities, housework, yardwork, hobbies, sitting, standing, transferring, traveling, walking, running, bending, lifting, sleeping.      [facility] PT Assessment - 08/21/19 0001  Assessment   Medical Diagnosis  left hip pain    Referring Provider (PT)  Gregor Hams, MD    Onset Date/Surgical Date  07/15/19    Hand Dominance  Right    Next MD Visit  none scheduled    Prior Therapy  none for this problem prior to current episode of care      Precautions   Precautions  None      Restrictions   Weight Bearing Restrictions  No      Balance Screen   Has the patient fallen in the past 6 months  Yes    How many times?  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 Has not needed to go on light duty.      Leisure  single mom taking care of 74 kids (98 year olds), trying to get house in order after moving twice, crafting, sewing, yardwork.       Cognition   Overall Cognitive Status  Within Functional Limits for tasks assessed      Observation/Other  Assessments   Observations  see note from 08/19/2019       OBJECTIVE: OBSERVATION/INSPECTION: Patient presents with abdominal obesity, evidence of mild LE muscle wasting consistent with long term steroid use. No obvious soft tissue deformity around L hip.    PERIPHERAL JOINT MOTION (AROM/PROM in degrees):  *Indicates pain Hip  - Flexion: R = 116/126, L = 100/110 pain at lateral thigh. - Extension: R = 20, L = 15, painful (more painful and restricted with PROM and  knee flexed to 90 degress) - Abduction (PROM): B = WNL except painful at lateral L thigh - Adduction (PROM): R = WNL, L = pain full at lateral thigh - External rotation (PROM at 90 degrees flexion): R = 50, L = 38 groin pain. - Internal rotation (PROM at 90 degrees flexion): R = 15, L = 18 feels uncomfortable. Knee = B grossly WNL Ankle = B grossly WNL  STRENGTH:  *Indicates pain Hip  - Flexion: R = 4/5, L = 2/5 painful. - Extension: R = 4+/5, L = 3+/5 painful. - Abduction: R = 4+/5, L = 3+/5 painful. Knee - Ext: R = 5/5, L = 4/5 (feels weak) - Flex: R = 5/5, L = 4/5 (soreness) Ankle = WFL  REPEATED MOTIONS TESTING:  Standing partially loaded L hip extension x 3: during = uncomfortable especially at end range; after = no effect  SPECIAL TESTS:.  Straight leg raise (SLR): B = negative for concordant symptoms.  Slump: B = negative for concordant symptoms.  FABER: R = negative, 6.5 inches off table, L = positive for groin pain and lateral hip pain, 11.5 inches off table.  FADDIR: R = negative, L = painful  Scour: R = negative, L = painful  Ober test: B = negative  Fitzgerald Test: Anterior  Labrum  = positive for concordant popping sensation in hip, pain.  ACCESSORY MOTION:  - Pain reduction with long axis distraction  PALPATION: - TTP throughout anterior and lateral L hip and proximal anterior thigh muscles.  - Most TTP over glute med - Mild TTP at greater trochanter but no significant reproduction of  concordant pain.  FUNCTIONAL MOBILITY: - Bed mobility: sit <> supine and rolling I with extra time and effort due to pain and stiffness.  - Transfers: sit <> stand I with slow painful motion. - Gait: ambulates I household and short community distances I without AD, demo mild compensated trendelenburg gait favoring L hip. Able to ambulate with tandem gait without trendelenburg compensation when cued.  - Stairs: Ascends and descend 4 steps with BUE support and step to gait pattern avoiding L hip flexion.   EDUCATION/COGNITION: Patient is alert and oriented X 4.  Objective measurements completed on examination: See above findings.      TREATMENT:  Denies sensitivity to latex  Therapeutic exercise: to centralize symptoms and improve ROM, strength, muscular endurance, and activity tolerance required for successful completion of functional activities.  - seated isometrics for L hip: adduction against pillow,   flexion against hands, abduction against towel wrapped around distal thighs. 5 second holds x 5 reps each.  - seated glute sets 5 second hold, x 5 reps - Exercise purpose/form. Self management techniques. Education on diagnosis, prognosis, POC, anatomy and physiology of current condition Education on HEP including handout    HOME EXERCISE PROGRAM Access Code: [email or web address]  URL: [email or web address]  Date: 08/19/2019  Prepared by: Rosita Kea   Exercises  Seated Hip Adduction Isometrics with Diona Foley - 10 reps - 5 hold - 2x daily - 7x weekly  Isometric hip flexion - 10 reps - 5 seconds hold - 2x daily - 7x weekly  Seated Isometric Hip Abduction with Belt - 10 reps - 5 hold - 2x daily - 7x weekly  Seated Gluteal Sets - 10 reps - 5 hold - 2x daily - 7x weekly   Patient response to treatment:  Pt tolerated treatment well. Pt was able to complete all exercises with tolerable discsomfort. Pt required multimodal cuing for proper technique and to facilitate improved  neuromuscular control, strength, range of motion, and functional ability resulting in improved performance and form.    PT Education - 08/21/19 1043    Education Details  Exercise purpose/form. Self management techniques. Education on diagnosis, prognosis, POC, anatomy and physiology of current condition Education on HEP including handout    Person(s) Educated  Patient    Methods  Explanation;Demonstration;Verbal cues    Comprehension  Verbalized understanding;Returned demonstration       PT Short Term Goals - 08/21/19 1045      PT SHORT TERM GOAL #1   Title  Be independent with initial home exercise program for self-management of symptoms.    Baseline  Initial HEP provided at IE (08/19/2019);    Time  2    Period  Weeks    Status  New    Target Date  09/04/19        PT Long Term Goals - 08/21/19 1053      PT LONG TERM GOAL #1   Title  Be independent with a long-term home exercise program for self-management of symptoms.    Baseline  Initial HEP provided at IE (08/19/2019);    Time  6    Period  Weeks    Status  New  Target Date  10/02/19      PT LONG TERM GOAL #2   Title  Demonstrate improved FOTO score to equal or greater than 63% to demonstrate improvement in overall condition and self-reported functional ability.    Baseline  FOTO = 47 (08/19/2019);    Time  6    Period  Weeks    Status  New    Target Date  10/02/19      PT LONG TERM GOAL #3   Title  Have full left hip AROM with no compensations or increase in pain in all planes except intermittent end range discomfort to allow patient to complete valued activities with less difficulty.    Baseline  see objective exam (08/19/2019);    Time  6    Period  Weeks    Status  New    Target Date  10/02/19      PT LONG TERM GOAL #4   Title  Patient will demonstrate B LE strength to equal or greater than 4+/5 to demonstrate improved functional strength for improved mobility, increased standing tolerance, and improved ADL  ability.    Baseline  see objective exam (08/19/2019);    Time  6    Period  Weeks    Status  New    Target Date  10/02/19      PT LONG TERM GOAL #5   Title  Complete community, work and/or recreational activities without limitation due to current condition.    Baseline  difficulty with ADLs, IADLs, work activities, housework, yardwork, hobbies, sitting, standing, transferring, traveling, walking, running, bending, lifting, sleeping (08/19/2019);    Time  6    Period  Weeks    Status  New    Target Date  10/02/19       Plan - 08/21/19 1104    Clinical Impression Statement  [name] referred to outpatient physical therapy with a medical diagnosis of left hip pain who presents with signs and symptoms consistent with L hip pain, stiffness, weakness following fall. Suspect labral tear with differential diagnosis of glute med strain or other pathology. Patient presents with significant pain, ROM, muscular strength/power/endurance, activity tolerance, and muscle spasm impairments that are limiting ability to complete usual ADLs, IADLs, work activities, housework, Haematologist, hobbies, sitting, standing, transferring, traveling, walking, running, bending, lifting, sleeping without difficulty. Patient will benefit from skilled physical therapy intervention to address current body structure impairments and activity limitations to improve function and work towards goals set in current POC in order to return to prior level of function or maximal functional improvement. Recommend trial of physical therapy for 6 weeks with plan for further medical assessment if unable to demonstrate improvement with physical therapy at that time.    Personal Factors and Comorbidities  Comorbidity 3+;Profession;Time since onset of injury/illness/exacerbation;Age;Fitness    Comorbidities  rheumatoid arthritis, DM type I (insulin pump with port at left abdomen), graves disease currently bothering her, steroid use;  History of migraines, history of MI and open heart surgery (no symptoms from that or lingering problems, attributed to complications from DMI), diabetic retinopathy (currently doing well), osteopenia (last scan was good).    Examination-Activity Limitations  Stairs;Bed Mobility;Dressing;Stand;Bend;Sit;Lift;Caring for Others;Transfers;Sleep;Squat;Carry;Other   difficulty with ADLs, IADLs, work activities, housework, yardwork, hobbies, sitting, standing, transferring, traveling, walking, running, bending, lifting, sleeping   Examination-Participation Restrictions  Other;Interpersonal Relationship;Yard Work;Cleaning;Laundry;Community Activity;Shop   work activities   Stability/Clinical Decision Making  Evolving/Moderate complexity    Clinical Decision Making  Moderate  Rehab Potential  Fair    PT Frequency  2x / week    PT Duration  6 weeks    PT Treatment/Interventions  ADLs/Self Care Home Management;Aquatic Therapy;Electrical Stimulation;Moist Heat;Cryotherapy;Gait training;Stair training;Functional mobility training;Therapeutic activities;Therapeutic exercise;Balance training;Neuromuscular re-education;Patient/family education;Manual techniques;Passive range of motion;Dry needling;Taping;Spinal Manipulations;Joint Manipulations;Other (comment)   joint mobilizations grades I-IV   PT Next Visit Plan  assess response to HEP and updage as needed, progress LE and functional strengthening and stretching as tolerated    PT Home Exercise Plan  Medbridge Access Code: [email or web address]    Consulted and Agree with Plan of Care  Patient        Patient will benefit from skilled therapeutic intervention in order to improve the following deficits and impairments:  Abnormal gait, Decreased balance, Decreased endurance, Decreased mobility, Difficulty walking, Increased muscle spasms, Impaired perceived functional ability, Obesity, Pain, Impaired flexibility, Decreased strength, Decreased activity tolerance  Visit  Diagnosis: 1. Pain in left hip   2. Stiffness of left hip, not elsewhere classified   3. Muscle weakness (generalized)   4. Difficulty in walking, not elsewhere classified        Problem List Patient Active Problem List   Diagnosis Date Noted  . Contusion of left hip 07/25/2019  . H/O multiple allergies 03/10/2019  . Hx of anaphylaxis 03/10/2019  . Cough 06/26/2018  . PND (post-nasal drip) 06/26/2018  . Vitamin D deficiency 07/04/2017  . B12 deficiency 07/04/2017  . Abnormal CT scan 06/19/2017  . Gastroesophageal reflux disease 06/19/2017  . Antiplatelet or antithrombotic long-term use 06/19/2017  . Nasal congestion 03/15/2017  . Graves disease 02/01/2017  . Sleep difficulties 02/01/2017  . No energy 02/01/2017  . Osteopenia 02/01/2017  . Hypertension, essential 05/08/2016  . IDDM (insulin dependent diabetes mellitus) ([place]) 05/08/2016  . Coronary artery disease involving native coronary artery of native heart with angina pectoris ([place])   . Hyperthyroidism 11/09/2010  . SINUS TACHYCARDIA 11/08/2010  . DERMATITIS, ALLERGIC 07/20/2010  . EDEMA 07/12/2010  . DIZZINESS 11/14/2009  . ATRIAL FIBRILLATION 07/20/2009  . CAD, ARTERY BYPASS GRAFT 06/07/2009  . ANGINA, STABLE/EXERTIONAL 06/02/2009  . HYPERLIPIDEMIA-MIXED 04/26/2009  . ACUT MI ANTEROLAT WALL SUBSQT EPIS CARE 04/26/2009  . Chest pain 04/26/2009  . DIABETIC  RETINOPATHY 04/25/2009  . CARPAL TUNNEL SYNDROME, BILATERAL 04/25/2009  . TRIGGER FINGER 04/25/2009  . MIGRAINE W/O AURA W/INTRACT W/STATUS MIGRAINOSUS 02/19/2008  . ALLERGIC RHINITIS, SEASONAL 10/16/2006  . CHOLELITHIASIS 10/16/2006  . Rheumatoid arthritis ([place]) 10/16/2006    Everlean Alstrom. Graylon Good, PT, DPT 08/21/19, 11:09 AM  [facility] [address], [place], [address] Phone: [phone]   Fax:  [phone]  Name: [name] MRN: [id number] Date of Birth: 1966/08/31

## 2019-08-24 ENCOUNTER — Ambulatory Visit: Payer: 59 | Admitting: Physical Therapy

## 2019-08-26 ENCOUNTER — Ambulatory Visit: Payer: 59 | Admitting: Physical Therapy

## 2019-08-27 ENCOUNTER — Ambulatory Visit: Payer: 59 | Admitting: Neurology

## 2019-09-01 ENCOUNTER — Other Ambulatory Visit: Payer: Self-pay

## 2019-09-01 ENCOUNTER — Ambulatory Visit: Payer: 59 | Attending: Family Medicine | Admitting: Physical Therapy

## 2019-09-01 DIAGNOSIS — M25652 Stiffness of left hip, not elsewhere classified: Secondary | ICD-10-CM | POA: Insufficient documentation

## 2019-09-01 DIAGNOSIS — R262 Difficulty in walking, not elsewhere classified: Secondary | ICD-10-CM | POA: Diagnosis not present

## 2019-09-01 DIAGNOSIS — M62830 Muscle spasm of back: Secondary | ICD-10-CM | POA: Diagnosis not present

## 2019-09-01 DIAGNOSIS — M545 Low back pain: Secondary | ICD-10-CM | POA: Diagnosis not present

## 2019-09-01 DIAGNOSIS — M6281 Muscle weakness (generalized): Secondary | ICD-10-CM | POA: Insufficient documentation

## 2019-09-01 DIAGNOSIS — M25552 Pain in left hip: Secondary | ICD-10-CM | POA: Diagnosis not present

## 2019-09-01 NOTE — Therapy (Signed)
[facility] [address] [place], [address] Phone: [phone]   Fax:  [phone]  Physical Therapy Treatment  Patient Details  Name: [name] MRN: [id number] Date of Birth: 12/02/1966 Referring Provider (PT): Gregor Hams, MD   Encounter Date: 09/01/2019  PT End of Session - 09/02/19 0849    Visit Number  2    Number of Visits  12    Date for PT Re-Evaluation  10/02/19    Authorization Type  [employer] reporting period from 08/21/2019    Authorization Time Period  Current Cert Period: 123456 - 10/02/2019    Authorization - Visit Number  2    Authorization - Number of Visits  10    PT Start Time  1630    PT Stop Time  1710    PT Time Calculation (min)  40 min    Activity Tolerance  Patient tolerated treatment well;Patient limited by pain    Behavior During Therapy  [facility] for tasks assessed/performed       Past Medical History:  Diagnosis Date  . ACUT MI ANTEROLAT WALL SUBSQT EPIS CARE   . Acute maxillary sinusitis   . ALLERGIC RHINITIS, SEASONAL   . ARTHRITIS, RHEUMATOID   . Atrial fibrillation ([place])    a. after CABG.  . Back pain   . CAD, ARTERY BYPASS GRAFT    a. DES to RCA in 2010 then LAD occlusion s/p CABG 3 06/07/2009 with LIMA to LAD, reverse SVG to D1, reverse SVG to distal RCA. b. Cath 05/08/2016 slightly hypodense region in the intermediate branch, however she had excellent flow, FFR was normal. Vein graft to PDA and the posterolateral branch is patent, patent LIMA to LAD, occluded SVG to diagonal.  . CARPAL TUNNEL SYNDROME, BILATERAL   . CHOLELITHIASIS   . Contrast media allergy   . DERMATITIS, ALLERGIC   . DIABETES MELLITUS, TYPE I    on insulin pump  . DIABETIC  RETINOPATHY   . Hiatal hernia   . HYPERLIPIDEMIA-MIXED   . HYPERTHYROIDISM   . Insulin pump in place   . MIGRAINE W/O AURA W/INTRACT W/STATUS MIGRAINOSUS 02/19/2008  . SINUS TACHYCARDIA 11/08/2010  . TRIGGER  FINGER   . URI     Past Surgical History:  Procedure Laterality Date  . ABDOMINAL HYSTERECTOMY    . Caesarean section    . CARDIAC CATHETERIZATION N/A 05/08/2016   Procedure: Left Heart Cath and Cors/Grafts Angiography;  Surgeon: Burnell Blanks, MD;  Location: [facility];  Service: Cardiovascular;  Laterality: N/A;  . CARPAL TUNNEL RELEASE    . CHOLECYSTECTOMY    . CORONARY ARTERY BYPASS GRAFT    . VITRECTOMY      There were no vitals filed for this visit.  Subjective Assessment - 09/01/19 1639    Subjective  back reports her pain has not changed much. Her back is bothering her more 6/10 constant ache in central low back. Continues to work with difficulty. Reports HEP is going okay and she is able to do all the exercises with gradually increasing ease.    Pertinent History  [name] who presents to outpatient physical therapy with a referral for medical diagnosis left hip pain. This patient's chief complaints consist of left hip/thigh pain, weakness, stiffness, decreased function, and popping following fall off stool onto L hip while hanging curtains leading to the following functional deficits difficulty with ADLs, IADLs, work  activities, housework, yardwork, hobbies, sitting, standing, transferring, traveling, walking, running, bending, lifting, sleeping. Relevant past medical history and comorbidities include rheumatoid arthritis, DM type I (insulin pump with port at left abdomen), graves disease currently bothering her, steroid use; History of migraines, history of MI and open heart surgery (no symptoms from that or lingering problems, attributed to complications from DMI), diabetic retinopathy (currently doing well), osteopenia (last scan was good).    Limitations  Sitting;Walking;Lifting;House hold activities;Standing;Other (comment)   difficulty with ADLs, IADLs, work activities, housework, yardwork, hobbies, sitting, standing, transferring, traveling,  walking, running, bending, lifting, sleeping   How long can you sit comfortably?  sitting is worse than standing, uncomfortable    How long can you stand comfortably?  unable to stand comfortably, is able to complete work duties with pain and difficulty    How long can you walk comfortably?  unable to walk comfortably, is able to complete work duties with pain and difficulty    Diagnostic tests  L hip radiograph report 07/14/2019: "Impression: These findings are consistent with mild osteoarthritis of the left hip joint."    Patient Stated Goals  want not to hurt and be able to go through her daily life without any problems    Currently in Pain?  Yes    Pain Score  3    3/10 standing still, 8/10 going sit to stand   Pain Onset  More than a month ago       TREATMENT:  Denies sensitivity to latex  Therapeutic exercise:to centralize symptoms and improve ROM, strength, muscular endurance, and activity tolerance required for successful completion of functional activities.  - prone on elbows for 2.5 minutes with intermittent rest breaks to prone to assess the effect of sustained lumbar extension in mid-range of motion and to prepare for trial of prone press ups. To attempt to improve lumbar pain. Painful the longer she stays in position, relieved to baseline with rest (no worse).  - prone press up 2x10 to assess response and attempt to relieve low back pain, fairly comfortable during exercise, no better following.  (manual therapy - see below) - Supine heel slides on L LE 2X10 with AAROM initially from clinician. - Standing hip abduction with narrow BUE support on TM bar for stabilization. Cuing for feet and upper torso positioning to midline. 2x10 each side - standing hip extension with narrow BUE support at TM bar to improve standing stabilization and improved hip strength for extension. Cuing for posture and technique.  - Squats with BUE support focusing on glute activation and proper form with  hip hinging, stabilized back, and tibial perpendicular to floor with knees behind toes (to minimize knee irritation - pt reports hx of knee pain). 2x10 through tolerable range of motion. - step up to 6 inch step with BUE support for balance, 2x10 each side to improve hip strength for functional activities.  - Education on HEP including handout   Manual therapy: to reduce pain and tissue tension, improve range of motion, neuromodulation, in order to promote improved ability to complete functional activities. - prone STM to L glute and lateral hip to decrease pain and tension - very tender points in mid glute region and at lateral hip over glute med.  - supine STM to L proximal quads and hip flexors and lateral hip musculature as tolerated to decrease tension and improve pain.   Patient found manual uncomfortable bu tolerable. Noted for increased muscle tension and muscular reactivity over painful regions. Tension mildly  decreased with continued gentle STM.    HOME EXERCISE (UPDATED 09/01/2019) Access Code: [email or web address]  URL: [email or web address]  Date: 09/01/2019  Prepared by: Rosita Kea   Exercises  Standing Hip Abduction with Counter Support - 2-3 sets - 10 reps - 1x daily - 7x weekly  Standing Hip Extension with Counter Support - 2-3 sets - 10 reps - 1x daily - 7x weekly  Squat at Table - 2-3 sets - 10 reps - 1x daily - 7x weekly  Step Up - 2-3 sets - 10 reps - 1x daily - 7x weekly     PT Education - 09/02/19 0849    Education Details  Exercise purpose/form. Self management techniques. Education on diagnosis,    Person(s) Educated  Patient    Methods  Explanation;Demonstration;Tactile cues;Verbal cues;Handout    Comprehension  Verbalized understanding;Returned demonstration       PT Short Term Goals - 08/21/19 1045      PT SHORT TERM GOAL #1   Title  Be independent with initial home exercise program for self-management of symptoms.    Baseline  Initial HEP provided  at IE (08/19/2019);    Time  2    Period  Weeks    Status  New    Target Date  09/04/19        PT Long Term Goals - 08/21/19 1053      PT LONG TERM GOAL #1   Title  Be independent with a long-term home exercise program for self-management of symptoms.    Baseline  Initial HEP provided at IE (08/19/2019);    Time  6    Period  Weeks    Status  New    Target Date  10/02/19      PT LONG TERM GOAL #2   Title  Demonstrate improved FOTO score to equal or greater than 63% to demonstrate improvement in overall condition and self-reported functional ability.    Baseline  FOTO = 47 (08/19/2019);    Time  6    Period  Weeks    Status  New    Target Date  10/02/19      PT LONG TERM GOAL #3   Title  Have full left hip AROM with no compensations or increase in pain in all planes except intermittent end range discomfort to allow patient to complete valued activities with less difficulty.    Baseline  see objective exam (08/19/2019);    Time  6    Period  Weeks    Status  New    Target Date  10/02/19      PT LONG TERM GOAL #4   Title  Patient will demonstrate B LE strength to equal or greater than 4+/5 to demonstrate improved functional strength for improved mobility, increased standing tolerance, and improved ADL ability.    Baseline  see objective exam (08/19/2019);    Time  6    Period  Weeks    Status  New    Target Date  10/02/19      PT LONG TERM GOAL #5   Title  Complete community, work and/or recreational activities without limitation due to current condition.    Baseline  difficulty with ADLs, IADLs, work activities, housework, yardwork, hobbies, sitting, standing, transferring, traveling, walking, running, bending, lifting, sleeping (08/19/2019);    Time  6    Period  Weeks    Status  New    Target Date  10/02/19  Plan - 09/02/19 0901    Clinical Impression Statement  Patient tolerated treatment well with some difficulty due to usual discomfort with motion.  Did not experience clicking or catching in hip with ROM selected and was no worse by end of session. No improvement to back pain with repeated motions assessment so attention returned to gentle progressions of hip activities to closed chain exercises in limited ROM. Pt was able to complete all exercises with tolerable discsomfort. HEP updated to include more functional progressions of exercises. Pt required multimodal cuing for proper technique and to facilitate improved neuromuscular control, strength, range of motion, and functional ability resulting in improved performance and form. Patient would benefit from continued physical therapy to address remaining impairments and functional limitations to work towards stated goals and return to PLOF or maximal functional independence.    Personal Factors and Comorbidities  Comorbidity 3+;Profession;Time since onset of injury/illness/exacerbation;Age;Fitness    Comorbidities  rheumatoid arthritis, DM type I (insulin pump with port at left abdomen), graves disease currently bothering her, steroid use; History of migraines, history of MI and open heart surgery (no symptoms from that or lingering problems, attributed to complications from DMI), diabetic retinopathy (currently doing well), osteopenia (last scan was good).    Examination-Activity Limitations  Stairs;Bed Mobility;Dressing;Stand;Bend;Sit;Lift;Caring for Others;Transfers;Sleep;Squat;Carry;Other   difficulty with ADLs, IADLs, work activities, housework, yardwork, hobbies, sitting, standing, transferring, traveling, walking, running, bending, lifting, sleeping   Examination-Participation Restrictions  Other;Interpersonal Relationship;Yard Work;Cleaning;Laundry;Community Activity;Shop   work activities   [employer]  Evolving/Moderate complexity    Rehab Potential  Fair    PT Frequency  2x / week    PT Duration  6 weeks    PT Treatment/Interventions  ADLs/Self Care Home  Management;Aquatic Therapy;Electrical Stimulation;Moist Heat;Cryotherapy;Gait training;Stair training;Functional mobility training;Therapeutic activities;Therapeutic exercise;Balance training;Neuromuscular re-education;Patient/family education;Manual techniques;Passive range of motion;Dry needling;Taping;Spinal Manipulations;Joint Manipulations;Other (comment)   joint mobilizations grades I-IV   PT Next Visit Plan  assess response to HEP and updage as needed, progress LE and functional strengthening and stretching as tolerated    PT Home Exercise Plan  Medbridge Access Code: [email or web address]    Consulted and Agree with Plan of Care  Patient       Patient will benefit from skilled therapeutic intervention in order to improve the following deficits and impairments:  Abnormal gait, Decreased balance, Decreased endurance, Decreased mobility, Difficulty walking, Increased muscle spasms, Impaired perceived functional ability, Obesity, Pain, Impaired flexibility, Decreased strength, Decreased activity tolerance  Visit Diagnosis: Pain in left hip  Stiffness of left hip, not elsewhere classified  Muscle weakness (generalized)  Difficulty in walking, not elsewhere classified     Problem List Patient Active Problem List   Diagnosis Date Noted  . Contusion of left hip 07/25/2019  . H/O multiple allergies 03/10/2019  . Hx of anaphylaxis 03/10/2019  . Cough 06/26/2018  . PND (post-nasal drip) 06/26/2018  . Vitamin D deficiency 07/04/2017  . B12 deficiency 07/04/2017  . Abnormal CT scan 06/19/2017  . Gastroesophageal reflux disease 06/19/2017  . Antiplatelet or antithrombotic long-term use 06/19/2017  . Nasal congestion 03/15/2017  . Graves disease 02/01/2017  . Sleep difficulties 02/01/2017  . No energy 02/01/2017  . Osteopenia 02/01/2017  . Hypertension, essential 05/08/2016  . IDDM (insulin dependent diabetes mellitus) ([place]) 05/08/2016  . Coronary artery disease involving native coronary artery  of native heart with angina pectoris ([place])   . Hyperthyroidism 11/09/2010  . SINUS TACHYCARDIA 11/08/2010  . DERMATITIS, ALLERGIC 07/20/2010  . EDEMA 07/12/2010  . DIZZINESS 11/14/2009  .  ATRIAL FIBRILLATION 07/20/2009  . CAD, ARTERY BYPASS GRAFT 06/07/2009  . ANGINA, STABLE/EXERTIONAL 06/02/2009  . HYPERLIPIDEMIA-MIXED 04/26/2009  . ACUT MI ANTEROLAT WALL SUBSQT EPIS CARE 04/26/2009  . Chest pain 04/26/2009  . DIABETIC  RETINOPATHY 04/25/2009  . CARPAL TUNNEL SYNDROME, BILATERAL 04/25/2009  . TRIGGER FINGER 04/25/2009  . MIGRAINE W/O AURA W/INTRACT W/STATUS MIGRAINOSUS 02/19/2008  . ALLERGIC RHINITIS, SEASONAL 10/16/2006  . CHOLELITHIASIS 10/16/2006  . Rheumatoid arthritis ([place]) 10/16/2006   Everlean Alstrom. Graylon Good, PT, DPT 09/02/19, 9:01 AM  [facility] [address], [place], [address] Phone: [phone]   Fax:  [phone]  Name: [name] MRN: [id number] Date of Birth: 1966-08-08

## 2019-09-02 ENCOUNTER — Encounter: Payer: Self-pay | Admitting: Physical Therapy

## 2019-09-03 ENCOUNTER — Other Ambulatory Visit: Payer: Self-pay

## 2019-09-03 ENCOUNTER — Ambulatory Visit: Payer: 59 | Admitting: Physical Therapy

## 2019-09-03 ENCOUNTER — Encounter: Payer: Self-pay | Admitting: Physical Therapy

## 2019-09-03 DIAGNOSIS — M25552 Pain in left hip: Secondary | ICD-10-CM | POA: Diagnosis not present

## 2019-09-03 DIAGNOSIS — M545 Low back pain: Secondary | ICD-10-CM | POA: Diagnosis not present

## 2019-09-03 DIAGNOSIS — M6281 Muscle weakness (generalized): Secondary | ICD-10-CM

## 2019-09-03 DIAGNOSIS — M25652 Stiffness of left hip, not elsewhere classified: Secondary | ICD-10-CM

## 2019-09-03 DIAGNOSIS — R262 Difficulty in walking, not elsewhere classified: Secondary | ICD-10-CM | POA: Diagnosis not present

## 2019-09-03 DIAGNOSIS — M62830 Muscle spasm of back: Secondary | ICD-10-CM | POA: Diagnosis not present

## 2019-09-03 NOTE — Therapy (Signed)
[facility] [address] [place], [address] Phone: [phone]   Fax:  [phone]  Physical Therapy Treatment  Patient Details  Name: [name] MRN: [id number] Date of Birth: Oct 07, 1966 Referring Provider (PT): Gregor Hams, MD   Encounter Date: 09/03/2019  PT End of Session - 09/03/19 1758    Visit Number  3    Number of Visits  12    Date for PT Re-Evaluation  10/02/19    Authorization Type  [employer] reporting period from 08/21/2019    Authorization Time Period  Current Cert Period: 123456 - 10/02/2019    Authorization - Visit Number  3    Authorization - Number of Visits  10    PT Start Time  1800    PT Stop Time  1845    PT Time Calculation (min)  45 min    Activity Tolerance  Patient tolerated treatment well;Patient limited by pain    Behavior During Therapy  [facility] for tasks assessed/performed       Past Medical History:  Diagnosis Date  . ACUT MI ANTEROLAT WALL SUBSQT EPIS CARE   . Acute maxillary sinusitis   . ALLERGIC RHINITIS, SEASONAL   . ARTHRITIS, RHEUMATOID   . Atrial fibrillation ([place])    a. after CABG.  . Back pain   . CAD, ARTERY BYPASS GRAFT    a. DES to RCA in 2010 then LAD occlusion s/p CABG 3 06/07/2009 with LIMA to LAD, reverse SVG to D1, reverse SVG to distal RCA. b. Cath 05/08/2016 slightly hypodense region in the intermediate branch, however she had excellent flow, FFR was normal. Vein graft to PDA and the posterolateral branch is patent, patent LIMA to LAD, occluded SVG to diagonal.  . CARPAL TUNNEL SYNDROME, BILATERAL   . CHOLELITHIASIS   . Contrast media allergy   . DERMATITIS, ALLERGIC   . DIABETES MELLITUS, TYPE I    on insulin pump  . DIABETIC  RETINOPATHY   . Hiatal hernia   . HYPERLIPIDEMIA-MIXED   . HYPERTHYROIDISM   . Insulin pump in place   . MIGRAINE W/O AURA W/INTRACT W/STATUS MIGRAINOSUS 02/19/2008  . SINUS TACHYCARDIA 11/08/2010  . TRIGGER  FINGER   . URI     Past Surgical History:  Procedure Laterality Date  . ABDOMINAL HYSTERECTOMY    . Caesarean section    . CARDIAC CATHETERIZATION N/A 05/08/2016   Procedure: Left Heart Cath and Cors/Grafts Angiography;  Surgeon: Burnell Blanks, MD;  Location: [facility];  Service: Cardiovascular;  Laterality: N/A;  . CARPAL TUNNEL RELEASE    . CHOLECYSTECTOMY    . CORONARY ARTERY BYPASS GRAFT    . VITRECTOMY      There were no vitals filed for this visit.  Subjective Assessment - 09/03/19 1811    Subjective  Patient reports hip pain as 4/10 and 5/10 with back pain. States she is feeling better overall and doing well with her HEP. Improved pain with standing up from sitting. Does think the back exercises from last session further irritated her back and did not continue doing them as part of her HEP.    Pertinent History  [name] who presents to outpatient physical therapy with a referral for medical diagnosis left hip pain. This patient's chief complaints consist of left hip/thigh pain, weakness, stiffness, decreased function, and popping following fall off stool onto L hip while hanging curtains leading to  the following functional deficits difficulty with ADLs, IADLs, work activities, housework, yardwork, hobbies, sitting, standing, transferring, traveling, walking, running, bending, lifting, sleeping. Relevant past medical history and comorbidities include rheumatoid arthritis, DM type I (insulin pump with port at left abdomen), graves disease currently bothering her, steroid use; History of migraines, history of MI and open heart surgery (no symptoms from that or lingering problems, attributed to complications from DMI), diabetic retinopathy (currently doing well), osteopenia (last scan was good).    Limitations  Sitting;Walking;Lifting;House hold activities;Standing;Other (comment)   difficulty with ADLs, IADLs, work activities, housework, yardwork,  hobbies, sitting, standing, transferring, traveling, walking, running, bending, lifting, sleeping   How long can you sit comfortably?  sitting is worse than standing, uncomfortable    How long can you stand comfortably?  unable to stand comfortably, is able to complete work duties with pain and difficulty    How long can you walk comfortably?  unable to walk comfortably, is able to complete work duties with pain and difficulty    Diagnostic tests  L hip radiograph report 07/14/2019: "Impression: These findings are consistent with mild osteoarthritis of the left hip joint."    Patient Stated Goals  want not to hurt and be able to go through her daily life without any problems    Currently in Pain?  Yes    Pain Score  5     Pain Onset  More than a month ago      TREATMENT: Denies sensitivity to latex  Therapeutic exercise:to centralize symptoms and improve ROM, strength, muscular endurance, and activity tolerance required for successful completion of functional activities. - NuStep level 2 without using bilateral upper and lower extremities. Setting . For improved extremity mobility, muscular endurance, and activity tolerance; and to induce the analgesic effect of aerobic exercise, stimulate improved joint nutrition, and prepare body structures and systems for following interventions. x 4  minutes.  31spm; 0.07 miles. Trialed bicycle and patient not able to proceed further due to aggravating pain at L hip.  - Supine heel slides on L LE 2X10 with AAROM initially from clinician. Patient able to tolerate with minor increase hip pain.  - Standing hip abduction on Airex with narrow BUE support on TM bar for stabilization. Cuing for feet and upper torso positioning to midline. 2x10 each side.  - standing hip extension with narrow BUE support at TM bar to improve standing stabilization and improved hip strength for extension. Cuing for posture and technique. x10 on each side + x 10 on each side standing  on Airex.  - Side stepping with yellow theraband wrapped around distal thighs to activate hip stabilizers and abductors in functional weightbearing position. Cuing to keep pelvis more level Stepping a few steps out and in as allowed by band 2x10 each side. - Squats on Airex with BUE support focusing on glute activation and proper form with hip hinging, stabilized back, and tibial perpendicular to floor with knees behind toes (to minimize knee irritation - pt reports hx of knee pain). 2x10 through tolerable range of motion. - Standing toe/heel raises 2x10 on Airex to improve ankle stability and strengthening to promote loading responses and reduced pain at L hip.   Manual therapy: to reduce pain and tissue tension, improve range of motion, neuromodulation, in order to promote improved ability to complete functional activities. - supine STM to L proximal quads and hip flexors and lateral hip musculature as tolerated to decrease tension and improve pain. With L leg dropped of edge  of plinth and knee flexed to stretch hip flexors.   HOME EXERCISE (UPDATED 09/01/2019) Access Code: [email or web address]  URL: [email or web address]  Date: 09/01/2019  Prepared by: Rosita Kea   Exercises   Standing Hip Abduction with Counter Support - 2-3 sets - 10 reps - 1x daily - 7x weekly   Standing Hip Extension with Counter Support - 2-3 sets - 10 reps - 1x daily - 7x weekly   Squat at Table - 2-3 sets - 10 reps - 1x daily - 7x weekly   Step Up - 2-3 sets - 10 reps - 1x daily - 7x weekly     PT Education - 09/03/19 1758    Education Details  Exercise purpose/form. Self management techniques.    Person(s) Educated  Patient    Methods  Explanation;Demonstration;Tactile cues;Verbal cues    Comprehension  Verbalized understanding;Returned demonstration;Verbal cues required;Tactile cues required       PT Short Term Goals - 09/03/19 1923      PT SHORT TERM GOAL #1   Title  Be independent with  initial home exercise program for self-management of symptoms.    Baseline  Initial HEP provided at IE (08/19/2019);    Time  2    Period  Weeks    Status  Achieved    Target Date  09/04/19        PT Long Term Goals - 08/21/19 1053      PT LONG TERM GOAL #1   Title  Be independent with a long-term home exercise program for self-management of symptoms.    Baseline  Initial HEP provided at IE (08/19/2019);    Time  6    Period  Weeks    Status  New    Target Date  10/02/19      PT LONG TERM GOAL #2   Title  Demonstrate improved FOTO score to equal or greater than 63% to demonstrate improvement in overall condition and self-reported functional ability.    Baseline  FOTO = 47 (08/19/2019);    Time  6    Period  Weeks    Status  New    Target Date  10/02/19      PT LONG TERM GOAL #3   Title  Have full left hip AROM with no compensations or increase in pain in all planes except intermittent end range discomfort to allow patient to complete valued activities with less difficulty.    Baseline  see objective exam (08/19/2019);    Time  6    Period  Weeks    Status  New    Target Date  10/02/19      PT LONG TERM GOAL #4   Title  Patient will demonstrate B LE strength to equal or greater than 4+/5 to demonstrate improved functional strength for improved mobility, increased standing tolerance, and improved ADL ability.    Baseline  see objective exam (08/19/2019);    Time  6    Period  Weeks    Status  New    Target Date  10/02/19      PT LONG TERM GOAL #5   Title  Complete community, work and/or recreational activities without limitation due to current condition.    Baseline  difficulty with ADLs, IADLs, work activities, housework, yardwork, hobbies, sitting, standing, transferring, traveling, walking, running, bending, lifting, sleeping (08/19/2019);    Time  6    Period  Weeks    Status  New    Target Date  10/02/19  Plan - 09/03/19 1908    Clinical Impression  Statement  Patient tolerated treatment well with minor difficulty due to usual discomfort with mobility at L proximal hip and anterior thigh. Patient showing improved pain control from 4 to 5/10 comparing to last session from 6 to 8/10. No complain of clicking or catching at L hip. Mild warm and swelling at L anterior thigh noted when palpation. Will continue progress L LE strengthening, dynamic balance and ambulation posture in later session. Patient would benefit from continued physical therapy to address remaining impairments and functional limitations to work towards stated goals and return to PLOF or maximal functional independence    Personal Factors and Comorbidities  Comorbidity 3+;Profession;Time since onset of injury/illness/exacerbation;Age;Fitness    Comorbidities  rheumatoid arthritis, DM type I (insulin pump with port at left abdomen), graves disease currently bothering her, steroid use; History of migraines, history of MI and open heart surgery (no symptoms from that or lingering problems, attributed to complications from DMI), diabetic retinopathy (currently doing well), osteopenia (last scan was good).    Examination-Activity Limitations  Stairs;Bed Mobility;Dressing;Stand;Bend;Sit;Lift;Caring for Others;Transfers;Sleep;Squat;Carry;Other   difficulty with ADLs, IADLs, work activities, housework, yardwork, hobbies, sitting, standing, transferring, traveling, walking, running, bending, lifting, sleeping   Examination-Participation Restrictions  Other;Interpersonal Relationship;Yard Work;Cleaning;Laundry;Community Activity;Shop   work activities   [employer]  Evolving/Moderate complexity    Rehab Potential  Fair    PT Frequency  2x / week    PT Duration  6 weeks    PT Treatment/Interventions  ADLs/Self Care Home Management;Aquatic Therapy;Electrical Stimulation;Moist Heat;Cryotherapy;Gait training;Stair training;Functional mobility training;Therapeutic  activities;Therapeutic exercise;Balance training;Neuromuscular re-education;Patient/family education;Manual techniques;Passive range of motion;Dry needling;Taping;Spinal Manipulations;Joint Manipulations;Other (comment)   joint mobilizations grades I-IV   PT Next Visit Plan  progress LE and functional strengthening and stretching as tolerated    PT Home Exercise Plan  Medbridge Access Code: [email or web address]    Consulted and Agree with Plan of Care  Patient       Patient will benefit from skilled therapeutic intervention in order to improve the following deficits and impairments:  Abnormal gait, Decreased balance, Decreased endurance, Decreased mobility, Difficulty walking, Increased muscle spasms, Impaired perceived functional ability, Obesity, Pain, Impaired flexibility, Decreased strength, Decreased activity tolerance  Visit Diagnosis: Pain in left hip  Stiffness of left hip, not elsewhere classified  Muscle weakness (generalized)  Difficulty in walking, not elsewhere classified     Problem List Patient Active Problem List   Diagnosis Date Noted  . Contusion of left hip 07/25/2019  . H/O multiple allergies 03/10/2019  . Hx of anaphylaxis 03/10/2019  . Cough 06/26/2018  . PND (post-nasal drip) 06/26/2018  . Vitamin D deficiency 07/04/2017  . B12 deficiency 07/04/2017  . Abnormal CT scan 06/19/2017  . Gastroesophageal reflux disease 06/19/2017  . Antiplatelet or antithrombotic long-term use 06/19/2017  . Nasal congestion 03/15/2017  . Graves disease 02/01/2017  . Sleep difficulties 02/01/2017  . No energy 02/01/2017  . Osteopenia 02/01/2017  . Hypertension, essential 05/08/2016  . IDDM (insulin dependent diabetes mellitus) ([place]) 05/08/2016  . Coronary artery disease involving native coronary artery of native heart with angina pectoris ([place])   . Hyperthyroidism 11/09/2010  . SINUS TACHYCARDIA 11/08/2010  . DERMATITIS, ALLERGIC 07/20/2010  . EDEMA 07/12/2010  . DIZZINESS  11/14/2009  . ATRIAL FIBRILLATION 07/20/2009  . CAD, ARTERY BYPASS GRAFT 06/07/2009  . ANGINA, STABLE/EXERTIONAL 06/02/2009  . HYPERLIPIDEMIA-MIXED 04/26/2009  . ACUT MI ANTEROLAT WALL SUBSQT EPIS CARE 04/26/2009  . Chest pain 04/26/2009  . DIABETIC  RETINOPATHY 04/25/2009  . CARPAL TUNNEL SYNDROME, BILATERAL 04/25/2009  . TRIGGER FINGER 04/25/2009  . MIGRAINE W/O AURA W/INTRACT W/STATUS MIGRAINOSUS 02/19/2008  . ALLERGIC RHINITIS, SEASONAL 10/16/2006  . CHOLELITHIASIS 10/16/2006  . Rheumatoid arthritis ([place]) 10/16/2006    Sherrilyn Rist, SPT 09/03/19, 7:24 PM    [facility] [address], [place], [address] Phone: [phone]   Fax:  [phone]  Name: ARNIE COTTERMAN MRN: [id number] Date of Birth: 1966/03/15

## 2019-09-08 ENCOUNTER — Ambulatory Visit: Payer: 59 | Admitting: Physical Therapy

## 2019-09-10 ENCOUNTER — Ambulatory Visit: Payer: 59

## 2019-09-10 ENCOUNTER — Encounter: Payer: Self-pay | Admitting: Physical Therapy

## 2019-09-10 ENCOUNTER — Other Ambulatory Visit: Payer: Self-pay

## 2019-09-10 DIAGNOSIS — M62830 Muscle spasm of back: Secondary | ICD-10-CM | POA: Diagnosis not present

## 2019-09-10 DIAGNOSIS — M25552 Pain in left hip: Secondary | ICD-10-CM | POA: Diagnosis not present

## 2019-09-10 DIAGNOSIS — M545 Low back pain, unspecified: Secondary | ICD-10-CM

## 2019-09-10 DIAGNOSIS — M6281 Muscle weakness (generalized): Secondary | ICD-10-CM

## 2019-09-10 DIAGNOSIS — R262 Difficulty in walking, not elsewhere classified: Secondary | ICD-10-CM | POA: Diagnosis not present

## 2019-09-10 DIAGNOSIS — M25652 Stiffness of left hip, not elsewhere classified: Secondary | ICD-10-CM | POA: Diagnosis not present

## 2019-09-10 NOTE — Therapy (Signed)
[facility] [address], [place], [address] Phone: [phone]   Fax:  [phone]  Physical Therapy Treatment  Patient Details  Name: [name] MRN: [id number] Date of Birth: 1966/10/12 Referring Provider (PT): Gregor Hams, MD   Encounter Date: 09/10/2019  PT End of Session - 09/10/19 1705    Visit Number  4    Number of Visits  12    Date for PT Re-Evaluation  10/02/19    Authorization Type  [employer] reporting period from 08/21/2019    Authorization Time Period  Current Cert Period: 123456 - 10/02/2019    Authorization - Visit Number  4    Authorization - Number of Visits  10    PT Start Time  [id number]    PT Stop Time  1800    PT Time Calculation (min)  45 min    Activity Tolerance  Patient tolerated treatment well;Patient limited by pain    Behavior During Therapy  [facility] for tasks assessed/performed       Past Medical History:  Diagnosis Date  . ACUT MI ANTEROLAT WALL SUBSQT EPIS CARE   . Acute maxillary sinusitis   . ALLERGIC RHINITIS, SEASONAL   . ARTHRITIS, RHEUMATOID   . Atrial fibrillation ([place])    a. after CABG.  . Back pain   . CAD, ARTERY BYPASS GRAFT    a. DES to RCA in 2010 then LAD occlusion s/p CABG 3 06/07/2009 with LIMA to LAD, reverse SVG to D1, reverse SVG to distal RCA. b. Cath 05/08/2016 slightly hypodense region in the intermediate branch, however she had excellent flow, FFR was normal. Vein graft to PDA and the posterolateral branch is patent, patent LIMA to LAD, occluded SVG to diagonal.  . CARPAL TUNNEL SYNDROME, BILATERAL   . CHOLELITHIASIS   . Contrast media allergy   . DERMATITIS, ALLERGIC   . DIABETES MELLITUS, TYPE I    on insulin pump  . DIABETIC  RETINOPATHY   . Hiatal hernia   . HYPERLIPIDEMIA-MIXED   . HYPERTHYROIDISM   . Insulin pump in place   . MIGRAINE W/O AURA W/INTRACT W/STATUS MIGRAINOSUS 02/19/2008  . SINUS TACHYCARDIA 11/08/2010  . TRIGGER  FINGER   . URI     Past Surgical History:  Procedure Laterality Date  . ABDOMINAL HYSTERECTOMY    . Caesarean section    . CARDIAC CATHETERIZATION N/A 05/08/2016   Procedure: Left Heart Cath and Cors/Grafts Angiography;  Surgeon: Burnell Blanks, MD;  Location: [facility];  Service: Cardiovascular;  Laterality: N/A;  . CARPAL TUNNEL RELEASE    . CHOLECYSTECTOMY    . CORONARY ARTERY BYPASS GRAFT    . VITRECTOMY      There were no vitals filed for this visit.  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 Stated her pain is 5/10.    Pertinent History  [name] who presents to outpatient physical therapy with a referral for medical diagnosis left hip pain. This patient's chief complaints consist of left hip/thigh pain, weakness, stiffness, decreased function, and popping following fall off stool onto L hip while hanging curtains leading to the following functional deficits difficulty with ADLs, IADLs, work activities, housework, Haematologist, hobbies, sitting, standing, transferring, traveling, walking, running, bending, lifting, sleeping. Relevant past medical history and comorbidities include rheumatoid arthritis, DM type I (  insulin pump with port at left abdomen), graves disease currently bothering her, steroid use; History of migraines, history of MI and open heart surgery (no symptoms from that or lingering problems, attributed to complications from DMI), diabetic retinopathy (currently doing well), osteopenia (last scan was good).    Limitations  Sitting;Walking;Lifting;House hold activities;Standing;Other (comment)    How long can you sit comfortably?  sitting is worse than standing, uncomfortable    How long can you stand comfortably?  unable to stand comfortably, is able to complete work duties with pain and difficulty    How long can you walk comfortably?  unable to walk  comfortably, is able to complete work duties with pain and difficulty    Diagnostic tests  L hip radiograph report 07/14/2019: "Impression: These findings are consistent with mild osteoarthritis of the left hip joint."    Patient Stated Goals  want not to hurt and be able to go through her daily life without any problems    Currently in Pain?  Yes    Pain Score  5     Pain Location  Hip    Pain Orientation  Left    Pain Descriptors / Indicators  Aching;Shooting    Pain Type  Acute pain    Pain Onset  More than a month ago        TREATMENT: Denies sensitivity to latex  Therapeutic exercise:to centralize symptoms and improve ROM, strength, muscular endurance, and activity tolerance required for successful completion of functional activities.  - NuStep level 2 without using bilateral upper extremities. Setting 7. For improved extremity mobility, muscular endurance, and activity tolerance; and to induce the analgesic effect of aerobic exercise, stimulate improved joint nutrition, and prepare body structures and systems for following interventions. x 4  minutes.  50spm; .11 miles.    - Supine heel slides on L LE 2X10AROM without any increase in pain -supine windshield wipers AROM 2x10  - Standing hip abduction Cuing for feet and upper torso positioning to midline. x10 each side.  -standing hip extension, Cuing for posture and technique. x10 on each side - Side stepping with yellow theraband wrapped around distal thighs to activate hip stabilizers and abductors in functional weightbearing position. Cuing to keep pelvis more level Stepping a few steps out and in as allowed by band 4 lengths x63ft -monster walking forward x47ft x4, backward walking x83ft x2 rounds  -Squats on Airex with BUE support focusing on glute activation and proper form with hip hinging, stabilized back, and tibial perpendicular to floor with knees behind toes(to minimize knee irritation - pt reports hx of knee pain).  2x10 through tolerable range of motion. - Standing toe/heel raises 2x10 on Airex to improve ankle stability and strengthening to promote loading responses and reduced pain at L hip.  - hip flexor stretch off table onto stool, PT to assist position 3x30sec -hip adduction stretch (butterfly stretch) 3x30secs  -clamshells Pt tactile cues for set up x20 bilaterally -Quadruped bird dogs (just leg extension), attempted but pt reported moderate increase in pain.  -single leg stance bilaterally on airex foam 5x15sec holds  -sliders for abduction x10 bilaterally Pt demo for form/verbal cues -sliders for extension x10 bilaterally Pt demo for form, verbal cues   Unable to continue due to increased pain  HOME EXERCISE (UPDATED 09/01/2019) Access Code: [email or web address]  URL: [email or web address]  Date: 09/01/2019  Prepared by: Rosita Kea   Exercises   Standing Hip Abduction with Counter Support - 2-3 sets - 10 reps -  1x daily - 7x weekly   Standing Hip Extension with Counter Support - 2-3 sets - 10 reps - 1x daily - 7x weekly   Squat at Table - 2-3 sets - 10 reps - 1x daily - 7x weekly   Step Up - 2-3 sets - 10 reps - 1x daily - 7x weekly   Pt response/clinical impression: Pt reported intermittent increase in pain with some exercises, modified accordingly. The patient remains motivated to continue to progress as able. The patient did not report any catching pain today, but did endorse groin pain. Overall the patient would benefit from further skilled PT to continue to address limitations.     PT Education - 09/10/19 1659    Education Details  exercise purpose/form    Person(s) Educated  Patient    Methods  Demonstration;Tactile cues;Verbal cues;Explanation    Comprehension  Verbalized understanding;Returned demonstration;Verbal cues required;Tactile cues required       PT Short Term Goals - 09/03/19 1923      PT SHORT TERM GOAL #1   Title  Be independent with initial home  exercise program for self-management of symptoms.    Baseline  Initial HEP provided at IE (08/19/2019);    Time  2    Period  Weeks    Status  Achieved    Target Date  09/04/19        PT Long Term Goals - 08/21/19 1053      PT LONG TERM GOAL #1   Title  Be independent with a long-term home exercise program for self-management of symptoms.    Baseline  Initial HEP provided at IE (08/19/2019);    Time  6    Period  Weeks    Status  New    Target Date  10/02/19      PT LONG TERM GOAL #2   Title  Demonstrate improved FOTO score to equal or greater than 63% to demonstrate improvement in overall condition and self-reported functional ability.    Baseline  FOTO = 47 (08/19/2019);    Time  6    Period  Weeks    Status  New    Target Date  10/02/19      PT LONG TERM GOAL #3   Title  Have full left hip AROM with no compensations or increase in pain in all planes except intermittent end range discomfort to allow patient to complete valued activities with less difficulty.    Baseline  see objective exam (08/19/2019);    Time  6    Period  Weeks    Status  New    Target Date  10/02/19      PT LONG TERM GOAL #4   Title  Patient will demonstrate B LE strength to equal or greater than 4+/5 to demonstrate improved functional strength for improved mobility, increased standing tolerance, and improved ADL ability.    Baseline  see objective exam (08/19/2019);    Time  6    Period  Weeks    Status  New    Target Date  10/02/19      PT LONG TERM GOAL #5   Title  Complete community, work and/or recreational activities without limitation due to current condition.    Baseline  difficulty with ADLs, IADLs, work activities, housework, yardwork, hobbies, sitting, standing, transferring, traveling, walking, running, bending, lifting, sleeping (08/19/2019);    Time  6    Period  Weeks    Status  New    Target  Date  10/02/19            Plan - 09/10/19 1720    Clinical Impression Statement  Pt  reported intermittent increase in pain with some exercises, modified accordingly. The patient remains motivated to continue to progress as able. The patient did not report any catching pain today, but did endorse groin pain. Overall the patient would benefit from further skilled PT to continue to address limitations.    Personal Factors and Comorbidities  Comorbidity 3+;Profession;Time since onset of injury/illness/exacerbation;Age;Fitness    Comorbidities  rheumatoid arthritis, DM type I (insulin pump with port at left abdomen), graves disease currently bothering her, steroid use; History of migraines, history of MI and open heart surgery (no symptoms from that or lingering problems, attributed to complications from DMI), diabetic retinopathy (currently doing well), osteopenia (last scan was good).    Examination-Activity Limitations  Stairs;Bed Mobility;Dressing;Stand;Bend;Sit;Lift;Caring for Others;Transfers;Sleep;Squat;Carry;Other    Examination-Participation Restrictions  Other;Interpersonal Relationship;Yard Work;Cleaning;Laundry;Community Activity;Shop    Rehab Potential  Fair    PT Frequency  2x / week    PT Duration  6 weeks    PT Treatment/Interventions  ADLs/Self Care Home Management;Aquatic Therapy;Electrical Stimulation;Moist Heat;Cryotherapy;Gait training;Stair training;Functional mobility training;Therapeutic activities;Therapeutic exercise;Balance training;Neuromuscular re-education;Patient/family education;Manual techniques;Passive range of motion;Dry needling;Taping;Spinal Manipulations;Joint Manipulations;Other (comment)    PT Next Visit Plan  progress LE and functional strengthening and stretching as tolerated    PT Home Exercise Plan  Medbridge Access Code: [email or web address]    Consulted and Agree with Plan of Care  Patient       Patient will benefit from skilled therapeutic intervention in order to improve the following deficits and impairments:  Abnormal gait, Decreased balance,  Decreased endurance, Decreased mobility, Difficulty walking, Increased muscle spasms, Impaired perceived functional ability, Obesity, Pain, Impaired flexibility, Decreased strength, Decreased activity tolerance  Visit Diagnosis: Pain in left hip  Stiffness of left hip, not elsewhere classified  Muscle weakness (generalized)  Difficulty in walking, not elsewhere classified  Pain, lumbar region  Muscle spasm of back     Problem List Patient Active Problem List   Diagnosis Date Noted  . Contusion of left hip 07/25/2019  . H/O multiple allergies 03/10/2019  . Hx of anaphylaxis 03/10/2019  . Cough 06/26/2018  . PND (post-nasal drip) 06/26/2018  . Vitamin D deficiency 07/04/2017  . B12 deficiency 07/04/2017  . Abnormal CT scan 06/19/2017  . Gastroesophageal reflux disease 06/19/2017  . Antiplatelet or antithrombotic long-term use 06/19/2017  . Nasal congestion 03/15/2017  . Graves disease 02/01/2017  . Sleep difficulties 02/01/2017  . No energy 02/01/2017  . Osteopenia 02/01/2017  . Hypertension, essential 05/08/2016  . IDDM (insulin dependent diabetes mellitus) ([place]) 05/08/2016  . Coronary artery disease involving native coronary artery of native heart with angina pectoris ([place])   . Hyperthyroidism 11/09/2010  . SINUS TACHYCARDIA 11/08/2010  . DERMATITIS, ALLERGIC 07/20/2010  . EDEMA 07/12/2010  . DIZZINESS 11/14/2009  . ATRIAL FIBRILLATION 07/20/2009  . CAD, ARTERY BYPASS GRAFT 06/07/2009  . ANGINA, STABLE/EXERTIONAL 06/02/2009  . HYPERLIPIDEMIA-MIXED 04/26/2009  . ACUT MI ANTEROLAT WALL SUBSQT EPIS CARE 04/26/2009  . Chest pain 04/26/2009  . DIABETIC  RETINOPATHY 04/25/2009  . CARPAL TUNNEL SYNDROME, BILATERAL 04/25/2009  . TRIGGER FINGER 04/25/2009  . MIGRAINE W/O AURA W/INTRACT W/STATUS MIGRAINOSUS 02/19/2008  . ALLERGIC RHINITIS, SEASONAL 10/16/2006  . CHOLELITHIASIS 10/16/2006  . Rheumatoid arthritis ([place]) 10/16/2006    Lieutenant Diego PT, DPT 6:01  PM,09/10/19 [facility] [address], [place], [address] Phone: [phone]   Fax:  [phone]  Name: [name] MRN: [id number] Date of Birth: 01/09/1966

## 2019-09-11 ENCOUNTER — Other Ambulatory Visit: Payer: Self-pay | Admitting: Rheumatology

## 2019-09-11 NOTE — Telephone Encounter (Signed)
Last Visit:07/14/2019 Next Visit:10/13/2019 Labs:08/12/19 Platelets 145, Glucose 124   Okay to refill per Dr. Estanislado Pandy

## 2019-09-15 ENCOUNTER — Other Ambulatory Visit: Payer: Self-pay

## 2019-09-15 ENCOUNTER — Ambulatory Visit: Payer: 59 | Admitting: Physical Therapy

## 2019-09-15 DIAGNOSIS — M25552 Pain in left hip: Secondary | ICD-10-CM | POA: Diagnosis not present

## 2019-09-15 DIAGNOSIS — M25652 Stiffness of left hip, not elsewhere classified: Secondary | ICD-10-CM

## 2019-09-15 DIAGNOSIS — M6281 Muscle weakness (generalized): Secondary | ICD-10-CM

## 2019-09-15 DIAGNOSIS — M545 Low back pain: Secondary | ICD-10-CM | POA: Diagnosis not present

## 2019-09-15 DIAGNOSIS — R262 Difficulty in walking, not elsewhere classified: Secondary | ICD-10-CM

## 2019-09-15 DIAGNOSIS — M62830 Muscle spasm of back: Secondary | ICD-10-CM | POA: Diagnosis not present

## 2019-09-15 MED FILL — SIMPONI 100 MG/ML SOAJ: 100 | 28 days supply | Qty: 1 | Fill #2

## 2019-09-15 NOTE — Therapy (Signed)
[facility] [address] [place], [address] Phone: [phone]   Fax:  [phone]  Physical Therapy Treatment  Patient Details  Name: [name] MRN: [id number] Date of Birth: 1966-01-04 Referring Provider (PT): Gregor Hams, MD   Encounter Date: 09/15/2019  PT End of Session - 09/16/19 1343    Visit Number  5    Number of Visits  12    Date for PT Re-Evaluation  10/02/19    Authorization Type  [employer] reporting period from 08/21/2019    Authorization Time Period  Current Cert Period: 123456 - 10/02/2019    Authorization - Visit Number  5    Authorization - Number of Visits  10    PT Start Time  [id number]    PT Stop Time  1715    PT Time Calculation (min)  40 min    Activity Tolerance  Patient tolerated treatment well;Patient limited by pain    Behavior During Therapy  [facility] for tasks assessed/performed       Past Medical History:  Diagnosis Date  . ACUT MI ANTEROLAT WALL SUBSQT EPIS CARE   . Acute maxillary sinusitis   . ALLERGIC RHINITIS, SEASONAL   . ARTHRITIS, RHEUMATOID   . Atrial fibrillation ([place])    a. after CABG.  . Back pain   . CAD, ARTERY BYPASS GRAFT    a. DES to RCA in 2010 then LAD occlusion s/p CABG 3 06/07/2009 with LIMA to LAD, reverse SVG to D1, reverse SVG to distal RCA. b. Cath 05/08/2016 slightly hypodense region in the intermediate branch, however she had excellent flow, FFR was normal. Vein graft to PDA and the posterolateral branch is patent, patent LIMA to LAD, occluded SVG to diagonal.  . CARPAL TUNNEL SYNDROME, BILATERAL   . CHOLELITHIASIS   . Contrast media allergy   . DERMATITIS, ALLERGIC   . DIABETES MELLITUS, TYPE I    on insulin pump  . DIABETIC  RETINOPATHY   . Hiatal hernia   . HYPERLIPIDEMIA-MIXED   . HYPERTHYROIDISM   . Insulin pump in place   . MIGRAINE W/O AURA W/INTRACT W/STATUS MIGRAINOSUS 02/19/2008  . SINUS TACHYCARDIA 11/08/2010  . TRIGGER  FINGER   . URI     Past Surgical History:  Procedure Laterality Date  . ABDOMINAL HYSTERECTOMY    . Caesarean section    . CARDIAC CATHETERIZATION N/A 05/08/2016   Procedure: Left Heart Cath and Cors/Grafts Angiography;  Surgeon: Burnell Blanks, MD;  Location: [facility];  Service: Cardiovascular;  Laterality: N/A;  . CARPAL TUNNEL RELEASE    . CHOLECYSTECTOMY    . CORONARY ARTERY BYPASS GRAFT    . VITRECTOMY      There were no vitals filed for this visit.  Subjective Assessment - 09/15/19 1635    Subjective  Patient reports she has done a lot of walking today 1,100 steps and so her hip is hurting a bit more. She states she was a bit sore after last treatment and it is hard to tell how long the soreness lasted because she did some yardwork over the weekend that also increased her pain.    Pertinent History  [name] who presents to outpatient physical therapy with a referral for medical diagnosis left hip pain. This patient's chief complaints consist of left hip/thigh pain, weakness, stiffness, decreased function, and popping following fall off stool onto L hip while hanging curtains  leading to the following functional deficits difficulty with ADLs, IADLs, work activities, housework, Haematologist, hobbies, sitting, standing, transferring, traveling, walking, running, bending, lifting, sleeping. Relevant past medical history and comorbidities include rheumatoid arthritis, DM type I (insulin pump with port at left abdomen), graves disease currently bothering her, steroid use; History of migraines, history of MI and open heart surgery (no symptoms from that or lingering problems, attributed to complications from DMI), diabetic retinopathy (currently doing well), osteopenia (last scan was good).    Limitations  Sitting;Walking;Lifting;House hold activities;Standing;Other (comment)    How long can you sit comfortably?  sitting is worse than standing, uncomfortable     How long can you stand comfortably?  unable to stand comfortably, is able to complete work duties with pain and difficulty    How long can you walk comfortably?  unable to walk comfortably, is able to complete work duties with pain and difficulty    Diagnostic tests  L hip radiograph report 07/14/2019: "Impression: These findings are consistent with mild osteoarthritis of the left hip joint."    Patient Stated Goals  want not to hurt and be able to go through her daily life without any problems    Currently in Pain?  Yes    Pain Score  5     Pain Location  Hip    Pain Orientation  Left    Pain Descriptors / Indicators  Aching;Shooting    Pain Type  Acute pain    Pain Onset  More than a month ago      TREATMENT: Denies sensitivity to latex  Therapeutic exercise:to centralize symptoms and improve ROM, strength, muscular endurance, and activity tolerance required for successful completion of functional activities. - Standing toe/heel raises 15 + 5 on Airex to improve ankle stability and strengthening to promote loading responses and reduced pain at L hip. -NuStep level 3 bilateral lower extremities only. Setting 7. For improved extremity mobility, muscular endurance, and activity tolerance; and to induce the analgesic effect of aerobic exercise, stimulate improved joint nutrition, and prepare body structures and systems for following interventions. x41minutes. 67spm;.59miles.  - step ups with contralateral hip flexion hold, touch down UE support as needed. X10. Attempted second set with 5# dumbbell held at side on contralateral side to support foot. Pain increasing so discontinued after x 3 on L.  - walking march with single arm farmer carry x 15 feet each side to challenge glute med without excessive hip flexion.  - prone isometric hip ER (knees flexed to 90 degrees, ankles squeezing against green youth ball, 5 second holds, x 2 min.  - prone hip IR against yellow band around ankles  while knees are flexed to 90 degrees, minimal motion due to weakness. Started to hurt at anterior thigh.  - Prone hip extension with knee flexed to preferentially bias gluteus maximus. 2x10 Cuing to keep knee flexed.  - LTR with knee drops (box position) within tolerable ROM x 2 min.  - Supine heel slides 2X10AROM in intolerable range each side.  -Side stepping withyellowtheraband wrapped arounddistal thighs to activatehip stabilizers and abductors in functional weightbearing position. Cuing to keep pelvis more levelStepping a few steps out and in as allowed by band 2 lengths x10 ft each direction.  -monster walking forward x32ft x2, backward walking x68ft x2 rounds, yellow theraband.   Manual therapy: to reduce pain and tissue tension, improve range of motion, neuromodulation, in order to promote improved ability to complete functional activities. - supine STM to L proximal quad  and lateral thigh to decrease tension and pain. Most TTP over L greater trochanter.   HOME EXERCISE (UPDATED 09/01/2019) Access Code: [email or web address]  URL: [email or web address]  Date: 09/01/2019  Prepared by: Rosita Kea   Exercises   Standing Hip Abduction with Counter Support - 2-3 sets - 10 reps - 1x daily - 7x weekly   Standing Hip Extension with Counter Support - 2-3 sets - 10 reps - 1x daily - 7x weekly   Squat at Table - 2-3 sets - 10 reps - 1x daily - 7x weekly   Step Up - 2-3 sets - 10 reps - 1x daily - 7x weekly    PT Education - 09/16/19 1343    Education Details  Exercise purpose/form. Self management techniques.    Person(s) Educated  Patient    Methods  Explanation;Demonstration;Tactile cues;Verbal cues    Comprehension  Verbalized understanding;Returned demonstration       PT Short Term Goals - 09/03/19 1923      PT SHORT TERM GOAL #1   Title  Be independent with initial home exercise program for self-management of symptoms.    Baseline  Initial HEP provided at IE  (08/19/2019);    Time  2    Period  Weeks    Status  Achieved    Target Date  09/04/19        PT Long Term Goals - 08/21/19 1053      PT LONG TERM GOAL #1   Title  Be independent with a long-term home exercise program for self-management of symptoms.    Baseline  Initial HEP provided at IE (08/19/2019);    Time  6    Period  Weeks    Status  New    Target Date  10/02/19      PT LONG TERM GOAL #2   Title  Demonstrate improved FOTO score to equal or greater than 63% to demonstrate improvement in overall condition and self-reported functional ability.    Baseline  FOTO = 47 (08/19/2019);    Time  6    Period  Weeks    Status  New    Target Date  10/02/19      PT LONG TERM GOAL #3   Title  Have full left hip AROM with no compensations or increase in pain in all planes except intermittent end range discomfort to allow patient to complete valued activities with less difficulty.    Baseline  see objective exam (08/19/2019);    Time  6    Period  Weeks    Status  New    Target Date  10/02/19      PT LONG TERM GOAL #4   Title  Patient will demonstrate B LE strength to equal or greater than 4+/5 to demonstrate improved functional strength for improved mobility, increased standing tolerance, and improved ADL ability.    Baseline  see objective exam (08/19/2019);    Time  6    Period  Weeks    Status  New    Target Date  10/02/19      PT LONG TERM GOAL #5   Title  Complete community, work and/or recreational activities without limitation due to current condition.    Baseline  difficulty with ADLs, IADLs, work activities, housework, yardwork, hobbies, sitting, standing, transferring, traveling, walking, running, bending, lifting, sleeping (08/19/2019);    Time  6    Period  Weeks    Status  New    Target Date  10/02/19  Plan - 09/16/19 1350    Clinical Impression Statement  Pt response/clinical impression: Patient tolerated treatment well with some difficulty due to  intermittent increase in pain with exercises requiring hip flexion and abduction. Modified exercises to accommodate and provided rest breaks as needed. Patient slightly more sore due to activity today and over the weekend. No catching pain today. Patient continues to experience anterior and lateral hip pain that worsens with flexion and abduction activities that limits her functional ability and quality of life and would benefit from further skilled PT to address these limitations.    Personal Factors and Comorbidities  Comorbidity 3+;Profession;Time since onset of injury/illness/exacerbation;Age;Fitness    Comorbidities  rheumatoid arthritis, DM type I (insulin pump with port at left abdomen), graves disease currently bothering her, steroid use; History of migraines, history of MI and open heart surgery (no symptoms from that or lingering problems, attributed to complications from DMI), diabetic retinopathy (currently doing well), osteopenia (last scan was good).    Examination-Activity Limitations  Stairs;Bed Mobility;Dressing;Stand;Bend;Sit;Lift;Caring for Others;Transfers;Sleep;Squat;Carry;Other    Examination-Participation Restrictions  Other;Interpersonal Relationship;Yard Work;Cleaning;Laundry;Community Activity;Shop    Rehab Potential  Fair    PT Frequency  2x / week    PT Duration  6 weeks    PT Treatment/Interventions  ADLs/Self Care Home Management;Aquatic Therapy;Electrical Stimulation;Moist Heat;Cryotherapy;Gait training;Stair training;Functional mobility training;Therapeutic activities;Therapeutic exercise;Balance training;Neuromuscular re-education;Patient/family education;Manual techniques;Passive range of motion;Dry needling;Taping;Spinal Manipulations;Joint Manipulations;Other (comment)    PT Next Visit Plan  progress LE and functional strengthening and stretching as tolerated    PT Home Exercise Plan  Medbridge Access Code: [email or web address]    Consulted and Agree with Plan of Care  Patient        Patient will benefit from skilled therapeutic intervention in order to improve the following deficits and impairments:  Abnormal gait, Decreased balance, Decreased endurance, Decreased mobility, Difficulty walking, Increased muscle spasms, Impaired perceived functional ability, Obesity, Pain, Impaired flexibility, Decreased strength, Decreased activity tolerance  Visit Diagnosis: Pain in left hip  Stiffness of left hip, not elsewhere classified  Muscle weakness (generalized)  Difficulty in walking, not elsewhere classified     Problem List Patient Active Problem List   Diagnosis Date Noted  . Contusion of left hip 07/25/2019  . H/O multiple allergies 03/10/2019  . Hx of anaphylaxis 03/10/2019  . Cough 06/26/2018  . PND (post-nasal drip) 06/26/2018  . Vitamin D deficiency 07/04/2017  . B12 deficiency 07/04/2017  . Abnormal CT scan 06/19/2017  . Gastroesophageal reflux disease 06/19/2017  . Antiplatelet or antithrombotic long-term use 06/19/2017  . Nasal congestion 03/15/2017  . Graves disease 02/01/2017  . Sleep difficulties 02/01/2017  . No energy 02/01/2017  . Osteopenia 02/01/2017  . Hypertension, essential 05/08/2016  . IDDM (insulin dependent diabetes mellitus) ([place]) 05/08/2016  . Coronary artery disease involving native coronary artery of native heart with angina pectoris ([place])   . Hyperthyroidism 11/09/2010  . SINUS TACHYCARDIA 11/08/2010  . DERMATITIS, ALLERGIC 07/20/2010  . EDEMA 07/12/2010  . DIZZINESS 11/14/2009  . ATRIAL FIBRILLATION 07/20/2009  . CAD, ARTERY BYPASS GRAFT 06/07/2009  . ANGINA, STABLE/EXERTIONAL 06/02/2009  . HYPERLIPIDEMIA-MIXED 04/26/2009  . ACUT MI ANTEROLAT WALL SUBSQT EPIS CARE 04/26/2009  . Chest pain 04/26/2009  . DIABETIC  RETINOPATHY 04/25/2009  . CARPAL TUNNEL SYNDROME, BILATERAL 04/25/2009  . TRIGGER FINGER 04/25/2009  . MIGRAINE W/O AURA W/INTRACT W/STATUS MIGRAINOSUS 02/19/2008  . ALLERGIC RHINITIS, SEASONAL 10/16/2006   . CHOLELITHIASIS 10/16/2006  . Rheumatoid arthritis ([place]) 10/16/2006    Everlean Alstrom. Graylon Good, PT, DPT 09/16/19, 1:52  PM  [facility] [address] [place], [address] Phone: [phone]   Fax:  [phone]  Name: [name] MRN: [id number] Date of Birth: 04-Jan-1966

## 2019-09-16 ENCOUNTER — Encounter: Payer: Self-pay | Admitting: Physical Therapy

## 2019-09-17 ENCOUNTER — Ambulatory Visit: Payer: 59 | Admitting: Physical Therapy

## 2019-09-21 DIAGNOSIS — E1039 Type 1 diabetes mellitus with other diabetic ophthalmic complication: Secondary | ICD-10-CM | POA: Diagnosis not present

## 2019-09-21 DIAGNOSIS — E1065 Type 1 diabetes mellitus with hyperglycemia: Secondary | ICD-10-CM | POA: Diagnosis not present

## 2019-09-22 ENCOUNTER — Encounter: Payer: Self-pay | Admitting: Physical Therapy

## 2019-09-22 ENCOUNTER — Ambulatory Visit: Payer: 59 | Admitting: Physical Therapy

## 2019-09-22 ENCOUNTER — Other Ambulatory Visit: Payer: Self-pay

## 2019-09-22 DIAGNOSIS — M62830 Muscle spasm of back: Secondary | ICD-10-CM | POA: Diagnosis not present

## 2019-09-22 DIAGNOSIS — M25552 Pain in left hip: Secondary | ICD-10-CM

## 2019-09-22 DIAGNOSIS — M6281 Muscle weakness (generalized): Secondary | ICD-10-CM | POA: Diagnosis not present

## 2019-09-22 DIAGNOSIS — M545 Low back pain: Secondary | ICD-10-CM | POA: Diagnosis not present

## 2019-09-22 DIAGNOSIS — M25652 Stiffness of left hip, not elsewhere classified: Secondary | ICD-10-CM | POA: Diagnosis not present

## 2019-09-22 DIAGNOSIS — R262 Difficulty in walking, not elsewhere classified: Secondary | ICD-10-CM | POA: Diagnosis not present

## 2019-09-22 NOTE — Therapy (Signed)
[facility] [address] [place], [address] Phone: [phone]   Fax:  [phone]  Physical Therapy Treatment  Patient Details  Name: [name] MRN: [id number] Date of Birth: 18-Sep-1966 Referring Provider (PT): Gregor Hams, MD   Encounter Date: 09/22/2019  PT End of Session - 09/22/19 1723    Visit Number  6    Number of Visits  12    Date for PT Re-Evaluation  10/02/19    Authorization Type  [employer] reporting period from 08/21/2019    Authorization Time Period  Current Cert Period: 123456 - 10/02/2019    Authorization - Visit Number  6    Authorization - Number of Visits  10    PT Start Time  [id number]    PT Stop Time  1800    PT Time Calculation (min)  40 min    Activity Tolerance  Patient tolerated treatment well;Patient limited by pain    Behavior During Therapy  [facility] for tasks assessed/performed       Past Medical History:  Diagnosis Date  . ACUT MI ANTEROLAT WALL SUBSQT EPIS CARE   . Acute maxillary sinusitis   . ALLERGIC RHINITIS, SEASONAL   . ARTHRITIS, RHEUMATOID   . Atrial fibrillation ([place])    a. after CABG.  . Back pain   . CAD, ARTERY BYPASS GRAFT    a. DES to RCA in 2010 then LAD occlusion s/p CABG 3 06/07/2009 with LIMA to LAD, reverse SVG to D1, reverse SVG to distal RCA. b. Cath 05/08/2016 slightly hypodense region in the intermediate branch, however she had excellent flow, FFR was normal. Vein graft to PDA and the posterolateral branch is patent, patent LIMA to LAD, occluded SVG to diagonal.  . CARPAL TUNNEL SYNDROME, BILATERAL   . CHOLELITHIASIS   . Contrast media allergy   . DERMATITIS, ALLERGIC   . DIABETES MELLITUS, TYPE I    on insulin pump  . DIABETIC  RETINOPATHY   . Hiatal hernia   . HYPERLIPIDEMIA-MIXED   . HYPERTHYROIDISM   . Insulin pump in place   . MIGRAINE W/O AURA W/INTRACT W/STATUS MIGRAINOSUS 02/19/2008  . SINUS TACHYCARDIA 11/08/2010  . TRIGGER  FINGER   . URI     Past Surgical History:  Procedure Laterality Date  . ABDOMINAL HYSTERECTOMY    . Caesarean section    . CARDIAC CATHETERIZATION N/A 05/08/2016   Procedure: Left Heart Cath and Cors/Grafts Angiography;  Surgeon: Burnell Blanks, MD;  Location: [facility];  Service: Cardiovascular;  Laterality: N/A;  . CARPAL TUNNEL RELEASE    . CHOLECYSTECTOMY    . CORONARY ARTERY BYPASS GRAFT    . VITRECTOMY      There were no vitals filed for this visit.  Subjective Assessment - 09/22/19 1726    Subjective  Patient reports she is feeling okay today with 5/10 pain. Her pain is continuing over the L hip flexor and less a the lateral hip. She reports she was sore for a day following last treatment session. She states she feels her progress has plateued overall. Continues to report catching/popping and difficulty with sit to stand.    Pertinent History  [name] who presents to outpatient physical therapy with a referral for medical diagnosis left hip pain. This patient's chief complaints consist of left hip/thigh pain, weakness, stiffness, decreased function, and popping following fall off stool onto L hip while hanging  curtains leading to the following functional deficits difficulty with ADLs, IADLs, work activities, housework, Haematologist, hobbies, sitting, standing, transferring, traveling, walking, running, bending, lifting, sleeping. Relevant past medical history and comorbidities include rheumatoid arthritis, DM type I (insulin pump with port at left abdomen), graves disease currently bothering her, steroid use; History of migraines, history of MI and open heart surgery (no symptoms from that or lingering problems, attributed to complications from DMI), diabetic retinopathy (currently doing well), osteopenia (last scan was good).    Limitations  Sitting;Walking;Lifting;House hold activities;Standing;Other (comment)    How long can you sit comfortably?  sitting  is worse than standing, uncomfortable    How long can you stand comfortably?  unable to stand comfortably, is able to complete work duties with pain and difficulty    How long can you walk comfortably?  unable to walk comfortably, is able to complete work duties with pain and difficulty    Diagnostic tests  L hip radiograph report 07/14/2019: "Impression: These findings are consistent with mild osteoarthritis of the left hip joint."    Patient Stated Goals  want not to hurt and be able to go through her daily life without any problems    Currently in Pain?  Yes    Pain Score  5     Pain Onset  More than a month ago       TREATMENT: Denies sensitivity to latex  Therapeutic exercise:to centralize symptoms and improve ROM, strength, muscular endurance, and activity tolerance required for successful completion of functional activities. -NuStep level1 bilateral lower extremities only. Setting5  For improved extremity mobility, muscular endurance, and activity tolerance; and to induce the analgesic effect of aerobic exercise, stimulate improved joint nutrition, and prepare body structures and systems for following interventions. x60minutes.Patient with improved hip flexion tolerance this session.  - walking march with single arm farmer carry x96 feet each side to challenge glute med without excessive hip flexion. 6# dumbbell. - step ups with contralateral hip flexion hold, touch down UE support as needed. X10.  setp up to 6 inch step.  - running man exercise with unilateral UE touchdown support. 2x10 each side. To improve coordination and LE stability. Patient noted for decreased balance standing on L compared to R.  -Side stepping withyellowtheraband wrapped arounddistal thighs to activatehip stabilizers and abductors in functional weightbearing position. Cuing to keep pelvis more levelStepping a few steps out and in as allowed by band2x22 ft each direction.  -monster walking 2x22  feet forwards and backward diagonals, red theraband.  - seated Hip IR and ER 2x10 each side with red theraband and foam roll blocking at medial/lateral knee to improve isolation of movement. Reports ER easier than IR.    HOME EXERCISE (UPDATED 09/01/2019) Access Code: [email or web address]  URL: [email or web address]  Date: 09/22/2019  Prepared by: Rosita Kea  Exercises  Standing Hip Abduction with Counter Support - 2-3 sets - 10 reps - 1x daily - 7x weekly  Squat at Table - 2-3 sets - 10 reps - 1x daily - 7x weekly  Step Up - 2-3 sets - 10 reps - 1x daily - 7x weekly  Single Leg Running Balance - 2-3 sets - 10 reps - 2x daily - 7x weekly   PT Education - 09/22/19 1928    Education Details  Exercise purpose/form. Self management techniques.    Person(s) Educated  Patient    Methods  Explanation;Demonstration;Tactile cues;Verbal cues    Comprehension  Verbalized understanding;Returned demonstration       PT  Short Term Goals - 09/03/19 1923      PT SHORT TERM GOAL #1   Title  Be independent with initial home exercise program for self-management of symptoms.    Baseline  Initial HEP provided at IE (08/19/2019);    Time  2    Period  Weeks    Status  Achieved    Target Date  09/04/19        PT Long Term Goals - 08/21/19 1053      PT LONG TERM GOAL #1   Title  Be independent with a long-term home exercise program for self-management of symptoms.    Baseline  Initial HEP provided at IE (08/19/2019);    Time  6    Period  Weeks    Status  New    Target Date  10/02/19      PT LONG TERM GOAL #2   Title  Demonstrate improved FOTO score to equal or greater than 63% to demonstrate improvement in overall condition and self-reported functional ability.    Baseline  FOTO = 47 (08/19/2019);    Time  6    Period  Weeks    Status  New    Target Date  10/02/19      PT LONG TERM GOAL #3   Title  Have full left hip AROM with no compensations or increase in pain in all planes  except intermittent end range discomfort to allow patient to complete valued activities with less difficulty.    Baseline  see objective exam (08/19/2019);    Time  6    Period  Weeks    Status  New    Target Date  10/02/19      PT LONG TERM GOAL #4   Title  Patient will demonstrate B LE strength to equal or greater than 4+/5 to demonstrate improved functional strength for improved mobility, increased standing tolerance, and improved ADL ability.    Baseline  see objective exam (08/19/2019);    Time  6    Period  Weeks    Status  New    Target Date  10/02/19      PT LONG TERM GOAL #5   Title  Complete community, work and/or recreational activities without limitation due to current condition.    Baseline  difficulty with ADLs, IADLs, work activities, housework, yardwork, hobbies, sitting, standing, transferring, traveling, walking, running, bending, lifting, sleeping (08/19/2019);    Time  6    Period  Weeks    Status  New    Target Date  10/02/19            Plan - 09/22/19 1935    Clinical Impression Statement  Patient tolerated treatment well and was actually able to tolerate greater volume of more demanding exercises challenging bilateral hips compared to previous treatment sessions, showing improvements in functional activity tolerance, strength, and proprioception. Did have some popping in the L hip occasionally with side stepping exercise that resolved when modified to be in a deeper squat position. Patient continues to demonstrate altered gait pattern, limited activity tolerance, weakness, and decreased ROM that is limiting her ability to complete usual ADLs, IADLs, and work activities without difficulty. Patient would benefit from continued physical therapy to address remaining impairments and functional limitations to work towards stated goals and return to PLOF or maximal functional independence.    Personal Factors and Comorbidities  Comorbidity 3+;Profession;Time since onset  of injury/illness/exacerbation;Age;Fitness    Comorbidities  rheumatoid arthritis, DM type I (  insulin pump with port at left abdomen), graves disease currently bothering her, steroid use; History of migraines, history of MI and open heart surgery (no symptoms from that or lingering problems, attributed to complications from DMI), diabetic retinopathy (currently doing well), osteopenia (last scan was good).    Examination-Activity Limitations  Stairs;Bed Mobility;Dressing;Stand;Bend;Sit;Lift;Caring for Others;Transfers;Sleep;Squat;Carry;Other    Examination-Participation Restrictions  Other;Interpersonal Relationship;Yard Work;Cleaning;Laundry;Community Activity;Shop    Rehab Potential  Fair    PT Frequency  2x / week    PT Duration  6 weeks    PT Treatment/Interventions  ADLs/Self Care Home Management;Aquatic Therapy;Electrical Stimulation;Moist Heat;Cryotherapy;Gait training;Stair training;Functional mobility training;Therapeutic activities;Therapeutic exercise;Balance training;Neuromuscular re-education;Patient/family education;Manual techniques;Passive range of motion;Dry needling;Taping;Spinal Manipulations;Joint Manipulations;Other (comment)    PT Next Visit Plan  progress LE and functional strengthening and stretching as tolerated    PT Home Exercise Plan  Medbridge Access Code: [email or web address]    Consulted and Agree with Plan of Care  Patient       Patient will benefit from skilled therapeutic intervention in order to improve the following deficits and impairments:  Abnormal gait, Decreased balance, Decreased endurance, Decreased mobility, Difficulty walking, Increased muscle spasms, Impaired perceived functional ability, Obesity, Pain, Impaired flexibility, Decreased strength, Decreased activity tolerance  Visit Diagnosis: Pain in left hip  Stiffness of left hip, not elsewhere classified  Muscle weakness (generalized)  Difficulty in walking, not elsewhere classified     Problem  List Patient Active Problem List   Diagnosis Date Noted  . Contusion of left hip 07/25/2019  . H/O multiple allergies 03/10/2019  . Hx of anaphylaxis 03/10/2019  . Cough 06/26/2018  . PND (post-nasal drip) 06/26/2018  . Vitamin D deficiency 07/04/2017  . B12 deficiency 07/04/2017  . Abnormal CT scan 06/19/2017  . Gastroesophageal reflux disease 06/19/2017  . Antiplatelet or antithrombotic long-term use 06/19/2017  . Nasal congestion 03/15/2017  . Graves disease 02/01/2017  . Sleep difficulties 02/01/2017  . No energy 02/01/2017  . Osteopenia 02/01/2017  . Hypertension, essential 05/08/2016  . IDDM (insulin dependent diabetes mellitus) ([place]) 05/08/2016  . Coronary artery disease involving native coronary artery of native heart with angina pectoris ([place])   . Hyperthyroidism 11/09/2010  . SINUS TACHYCARDIA 11/08/2010  . DERMATITIS, ALLERGIC 07/20/2010  . EDEMA 07/12/2010  . DIZZINESS 11/14/2009  . ATRIAL FIBRILLATION 07/20/2009  . CAD, ARTERY BYPASS GRAFT 06/07/2009  . ANGINA, STABLE/EXERTIONAL 06/02/2009  . HYPERLIPIDEMIA-MIXED 04/26/2009  . ACUT MI ANTEROLAT WALL SUBSQT EPIS CARE 04/26/2009  . Chest pain 04/26/2009  . DIABETIC  RETINOPATHY 04/25/2009  . CARPAL TUNNEL SYNDROME, BILATERAL 04/25/2009  . TRIGGER FINGER 04/25/2009  . MIGRAINE W/O AURA W/INTRACT W/STATUS MIGRAINOSUS 02/19/2008  . ALLERGIC RHINITIS, SEASONAL 10/16/2006  . CHOLELITHIASIS 10/16/2006  . Rheumatoid arthritis ([place]) 10/16/2006    Everlean Alstrom. Graylon Good, PT, DPT 09/22/19, 7:36 PM  [facility] [address] [place], [address] Phone: [phone]   Fax:  [phone]  Name: [name] MRN: [id number] Date of Birth: [birth year]

## 2019-09-24 ENCOUNTER — Other Ambulatory Visit: Payer: Self-pay

## 2019-09-24 ENCOUNTER — Encounter: Payer: Self-pay | Admitting: Physical Therapy

## 2019-09-24 ENCOUNTER — Ambulatory Visit: Payer: 59 | Admitting: Physical Therapy

## 2019-09-24 DIAGNOSIS — M545 Low back pain: Secondary | ICD-10-CM | POA: Diagnosis not present

## 2019-09-24 DIAGNOSIS — M25652 Stiffness of left hip, not elsewhere classified: Secondary | ICD-10-CM | POA: Diagnosis not present

## 2019-09-24 DIAGNOSIS — M6281 Muscle weakness (generalized): Secondary | ICD-10-CM

## 2019-09-24 DIAGNOSIS — R262 Difficulty in walking, not elsewhere classified: Secondary | ICD-10-CM | POA: Diagnosis not present

## 2019-09-24 DIAGNOSIS — M62830 Muscle spasm of back: Secondary | ICD-10-CM | POA: Diagnosis not present

## 2019-09-24 DIAGNOSIS — M25552 Pain in left hip: Secondary | ICD-10-CM | POA: Diagnosis not present

## 2019-09-24 NOTE — Therapy (Signed)
[facility] [address] [place], [address] Phone: [phone]   Fax:  [phone]  Physical Therapy Treatment  Patient Details  Name: [name] MRN: [id number] Date of Birth: [birth year] Referring Provider (PT): Gregor Hams, MD   Encounter Date: 09/24/2019  PT End of Session - 09/24/19 1904    Visit Number  7    Number of Visits  12    Date for PT Re-Evaluation  10/02/19    Authorization Type  [employer] reporting period from 08/21/2019    Authorization Time Period  Current Cert Period: 123456 - 10/02/2019    Authorization - Visit Number  7    Authorization - Number of Visits  10    PT Start Time  [id number]    PT Stop Time  1810    PT Time Calculation (min)  50 min    Activity Tolerance  Patient tolerated treatment well;Patient limited by pain    Behavior During Therapy  [facility] for tasks assessed/performed       Past Medical History:  Diagnosis Date  . ACUT MI ANTEROLAT WALL SUBSQT EPIS CARE   . Acute maxillary sinusitis   . ALLERGIC RHINITIS, SEASONAL   . ARTHRITIS, RHEUMATOID   . Atrial fibrillation ([place])    a. after CABG.  . Back pain   . CAD, ARTERY BYPASS GRAFT    a. DES to RCA in 2010 then LAD occlusion s/p CABG 3 06/07/2009 with LIMA to LAD, reverse SVG to D1, reverse SVG to distal RCA. b. Cath 05/08/2016 slightly hypodense region in the intermediate branch, however she had excellent flow, FFR was normal. Vein graft to PDA and the posterolateral branch is patent, patent LIMA to LAD, occluded SVG to diagonal.  . CARPAL TUNNEL SYNDROME, BILATERAL   . CHOLELITHIASIS   . Contrast media allergy   . DERMATITIS, ALLERGIC   . DIABETES MELLITUS, TYPE I    on insulin pump  . DIABETIC  RETINOPATHY   . Hiatal hernia   . HYPERLIPIDEMIA-MIXED   . HYPERTHYROIDISM   . Insulin pump in place   . MIGRAINE W/O AURA W/INTRACT W/STATUS MIGRAINOSUS 02/19/2008  . SINUS TACHYCARDIA 11/08/2010  . TRIGGER  FINGER   . URI     Past Surgical History:  Procedure Laterality Date  . ABDOMINAL HYSTERECTOMY    . Caesarean section    . CARDIAC CATHETERIZATION N/A 05/08/2016   Procedure: Left Heart Cath and Cors/Grafts Angiography;  Surgeon: Burnell Blanks, MD;  Location: [facility];  Service: Cardiovascular;  Laterality: N/A;  . CARPAL TUNNEL RELEASE    . CHOLECYSTECTOMY    . CORONARY ARTERY BYPASS GRAFT    . VITRECTOMY      There were no vitals filed for this visit.  Subjective Assessment - 09/24/19 1719    Subjective  Patient reports she is feeling 5/10 pain at her L hip. The pain is located equally on flexor and lateral hip. She also felt R knee popping and more L hip pain may due to the cold weather from her arthoritis.    Pertinent History  [name] who presents to outpatient physical therapy with a referral for medical diagnosis left hip pain. This patient's chief complaints consist of left hip/thigh pain, weakness, stiffness, decreased function, and popping following fall off stool onto L hip while hanging curtains leading to the following functional deficits difficulty with ADLs, IADLs, work activities, housework, Haematologist,  hobbies, sitting, standing, transferring, traveling, walking, running, bending, lifting, sleeping. Relevant past medical history and comorbidities include rheumatoid arthritis, DM type I (insulin pump with port at left abdomen), graves disease currently bothering her, steroid use; History of migraines, history of MI and open heart surgery (no symptoms from that or lingering problems, attributed to complications from DMI), diabetic retinopathy (currently doing well), osteopenia (last scan was good).    Limitations  Sitting;Walking;Lifting;House hold activities;Standing;Other (comment)    How long can you sit comfortably?  sitting is worse than standing, uncomfortable    How long can you stand comfortably?  unable to stand comfortably, is able to  complete work duties with pain and difficulty    How long can you walk comfortably?  unable to walk comfortably, is able to complete work duties with pain and difficulty    Diagnostic tests  L hip radiograph report 07/14/2019: "Impression: These findings are consistent with mild osteoarthritis of the left hip joint."    Patient Stated Goals  want not to hurt and be able to go through her daily life without any problems    Currently in Pain?  Yes    Pain Score  5     Pain Location  Hip    Pain Orientation  Left    Pain Onset  More than a month ago      TREATMENT: Denies sensitivity to latex  Therapeutic exercise:to centralize symptoms and improve ROM, strength, muscular endurance, and activity tolerance required for successful completion of functional activities. -NuStep level2bilaterallowerextremitiesonly.Setting5  For improved extremity mobility, muscular endurance, and activity tolerance; and to induce the analgesic effect of aerobic exercise, stimulate improved joint nutrition, and prepare body structures and systems for following interventions. x43minutes..  - With red theraband above knee:  - Sidlying hip abduction on R/L with 2x10 reps  - Supine bridge, with BUE cross over chest 2x10 reps - running man exercise with unilateral UE touchdown support. 2x10 each side. To improve coordination and LE stability. Patient noted for decreased balance standing on R compared to L.   - walking march with single arm farmer carry 2x80 feet each side to challenge glute med without excessive hip flexion. 6# dumbbell. - step up to stairs without UE support. 2x10 reps with L LE leading with 6# dumbbell.   Neuromuscular Re-education: to improve, balance, postural strength, muscle activation patterns, and stabilization strength required for functional activities: - tandem standing on airex.  - R foot in front: 2x30 seconds   - L foot in front: 2x30 seconds with multiple UE touchdown support  for balance - Single leg standing on airex  - R foot: 2x30 seconds with multiple UE touchdown support for balance  - L foot: 2x30 seconds with multiple UE touchdown support for balance Patient presented with tendencies to hold on treadmill bar due to fear of falling.    Manual therapy: to reduce pain and tissue tension, improve range of motion, neuromodulation, in order to promote improved ability to complete functional activities. - STM at L anterior thigh, L lateral gluteal regions for 10 minutes. Trigger point identified. Patient demonstrated less pain with sit to stand at the end of the session after manual therapy. Education provided for using tennis ball to perform trigger point release at home.     HOME EXERCISE (UPDATED 09/01/2019) Access Code: [email or web address]     URL: [email or web address]    Date: 09/22/2019  Prepared by: Rosita Kea  Exercises  Standing Hip Abduction with Counter Support - 2-3 sets -  10 reps - 1x daily - 7x weekly  Squat at Table - 2-3 sets - 10 reps - 1x daily - 7x weekly  Step Up - 2-3 sets - 10 reps - 1x daily - 7x weekly  Single Leg Running Balance - 2-3 sets - 10 reps - 2x daily - 7x weekly   Clinical impression:  Patient tolerated the session well with reduced L hip pain at the end of the session. Patient demonstrated good muscle activation for exercises accordingly and less cuing provided for proper forms. Patient also demonstrated improved gait pattern at the end of the session. Patient responded well with manual therapy for pain control. However, patient still complains of difficulties and moderate pain with sit to stand and limited L hip ROM to put on her socks. Patient would benefit from continued physical therapy to address remaining impairments and functional limitations to work towards stated goals and return to PLOF or maximal functional independence.   PT Short Term Goals - 09/03/19 1923      PT SHORT TERM GOAL #1   Title  Be  independent with initial home exercise program for self-management of symptoms.    Baseline  Initial HEP provided at IE (08/19/2019);    Time  2    Period  Weeks    Status  Achieved    Target Date  09/04/19        PT Long Term Goals - 08/21/19 1053      PT LONG TERM GOAL #1   Title  Be independent with a long-term home exercise program for self-management of symptoms.    Baseline  Initial HEP provided at IE (08/19/2019);    Time  6    Period  Weeks    Status  New    Target Date  10/02/19      PT LONG TERM GOAL #2   Title  Demonstrate improved FOTO score to equal or greater than 63% to demonstrate improvement in overall condition and self-reported functional ability.    Baseline  FOTO = 47 (08/19/2019);    Time  6    Period  Weeks    Status  New    Target Date  10/02/19      PT LONG TERM GOAL #3   Title  Have full left hip AROM with no compensations or increase in pain in all planes except intermittent end range discomfort to allow patient to complete valued activities with less difficulty.    Baseline  see objective exam (08/19/2019);    Time  6    Period  Weeks    Status  New    Target Date  10/02/19      PT LONG TERM GOAL #4   Title  Patient will demonstrate B LE strength to equal or greater than 4+/5 to demonstrate improved functional strength for improved mobility, increased standing tolerance, and improved ADL ability.    Baseline  see objective exam (08/19/2019);    Time  6    Period  Weeks    Status  New    Target Date  10/02/19      PT LONG TERM GOAL #5   Title  Complete community, work and/or recreational activities without limitation due to current condition.    Baseline  difficulty with ADLs, IADLs, work activities, housework, yardwork, hobbies, sitting, standing, transferring, traveling, walking, running, bending, lifting, sleeping (08/19/2019);    Time  6    Period  Weeks    Status  New  Target Date  10/02/19            Plan - 09/24/19 1906     Clinical Impression Statement  Patient tolerated the session well with reduced L hip pain at the end of the session. Patient demonstrated good muscle activation for exercises accordingly and less cuing provided for proper forms. Patient also demonstrated improved gait pattern at the end of the session. Patient responded well with manual therapy for pain control. However, patient still complains of difficulties and moderate pain with sit to stand and limited L hip ROM to put on her socks. Patient would benefit from continued physical therapy to address remaining impairments and functional limitations to work towards stated goals and return to PLOF or maximal functional independence.    Personal Factors and Comorbidities  Comorbidity 3+;Profession;Time since onset of injury/illness/exacerbation;Age;Fitness    Comorbidities  rheumatoid arthritis, DM type I (insulin pump with port at left abdomen), graves disease currently bothering her, steroid use; History of migraines, history of MI and open heart surgery (no symptoms from that or lingering problems, attributed to complications from DMI), diabetic retinopathy (currently doing well), osteopenia (last scan was good).    Examination-Activity Limitations  Stairs;Bed Mobility;Dressing;Stand;Bend;Sit;Lift;Caring for Others;Transfers;Sleep;Squat;Carry;Other    Examination-Participation Restrictions  Other;Interpersonal Relationship;Yard Work;Cleaning;Laundry;Community Activity;Shop    Rehab Potential  Fair    PT Frequency  2x / week    PT Duration  6 weeks    PT Treatment/Interventions  ADLs/Self Care Home Management;Aquatic Therapy;Electrical Stimulation;Moist Heat;Cryotherapy;Gait training;Stair training;Functional mobility training;Therapeutic activities;Therapeutic exercise;Balance training;Neuromuscular re-education;Patient/family education;Manual techniques;Passive range of motion;Dry needling;Taping;Spinal Manipulations;Joint Manipulations;Other (comment)     PT Next Visit Plan  progress LE and functional strengthening and stretching as tolerated    PT Home Exercise Plan  Medbridge Access Code: [email or web address]    Consulted and Agree with Plan of Care  Patient       Patient will benefit from skilled therapeutic intervention in order to improve the following deficits and impairments:  Abnormal gait, Decreased balance, Decreased endurance, Decreased mobility, Difficulty walking, Increased muscle spasms, Impaired perceived functional ability, Obesity, Pain, Impaired flexibility, Decreased strength, Decreased activity tolerance  Visit Diagnosis: Pain in left hip  Stiffness of left hip, not elsewhere classified  Muscle weakness (generalized)  Difficulty in walking, not elsewhere classified     Problem List Patient Active Problem List   Diagnosis Date Noted  . Contusion of left hip 07/25/2019  . H/O multiple allergies 03/10/2019  . Hx of anaphylaxis 03/10/2019  . Cough 06/26/2018  . PND (post-nasal drip) 06/26/2018  . Vitamin D deficiency 07/04/2017  . B12 deficiency 07/04/2017  . Abnormal CT scan 06/19/2017  . Gastroesophageal reflux disease 06/19/2017  . Antiplatelet or antithrombotic long-term use 06/19/2017  . Nasal congestion 03/15/2017  . Graves disease 02/01/2017  . Sleep difficulties 02/01/2017  . No energy 02/01/2017  . Osteopenia 02/01/2017  . Hypertension, essential 05/08/2016  . IDDM (insulin dependent diabetes mellitus) ([place]) 05/08/2016  . Coronary artery disease involving native coronary artery of native heart with angina pectoris ([place])   . Hyperthyroidism 11/09/2010  . SINUS TACHYCARDIA 11/08/2010  . DERMATITIS, ALLERGIC 07/20/2010  . EDEMA 07/12/2010  . DIZZINESS 11/14/2009  . ATRIAL FIBRILLATION 07/20/2009  . CAD, ARTERY BYPASS GRAFT 06/07/2009  . ANGINA, STABLE/EXERTIONAL 06/02/2009  . HYPERLIPIDEMIA-MIXED 04/26/2009  . ACUT MI ANTEROLAT WALL SUBSQT EPIS CARE 04/26/2009  . Chest pain 04/26/2009  . DIABETIC   RETINOPATHY 04/25/2009  . CARPAL TUNNEL SYNDROME, BILATERAL 04/25/2009  . TRIGGER FINGER 04/25/2009  . MIGRAINE  W/O AURA W/INTRACT W/STATUS MIGRAINOSUS 02/19/2008  . ALLERGIC RHINITIS, SEASONAL 10/16/2006  . CHOLELITHIASIS 10/16/2006  . Rheumatoid arthritis ([place]) 10/16/2006     Sherrilyn Rist, SPT 09/24/19, 7:18 PM  Everlean Alstrom. Graylon Good, PT, DPT 09/24/19, 7:18 PM    [facility] [address], [place], [address] Phone: [phone]   Fax:  [phone]  Name: JALONI OSTERGREN MRN: [id number] Date of Birth: [birth year]

## 2019-09-29 ENCOUNTER — Ambulatory Visit: Payer: 59 | Admitting: Physical Therapy

## 2019-09-29 NOTE — Progress Notes (Deleted)
Office Visit Note  Patient: [name]             Date of Birth: 1966-12-23           MRN: [id number]             PCP: Lavada Mesi Referring: Lavada Mesi Visit Date: 10/13/2019 Occupation: @GUAROCC @  Subjective:  No chief complaint on file.  Simponi 100 mg every 28 days, Arava 20 mg 1 tablet daily, and Plaquenil 200 mg 1 tablet twice daily.  Last Plaquenil eye exam normal on 01/06/2019.  Last TB gold negative on 07/31/2018.  Due for TB gold today and will monitor yearly.  Most recent CBC/CMP within normal limits except for borderline low platelets on 08/12/2019 and will monitor every 3 months.  History of Present Illness: [name] is a 53 y.o. female ***   Activities of Daily Living:  Patient reports morning stiffness for *** {minute/hour:19697}.   Patient {ACTIONS;DENIES/REPORTS:21021675::"Denies"} nocturnal pain.  Difficulty dressing/grooming: {ACTIONS;DENIES/REPORTS:21021675::"Denies"} Difficulty climbing stairs: {ACTIONS;DENIES/REPORTS:21021675::"Denies"} Difficulty getting out of chair: {ACTIONS;DENIES/REPORTS:21021675::"Denies"} Difficulty using hands for taps, buttons, cutlery, and/or writing: {ACTIONS;DENIES/REPORTS:21021675::"Denies"}  No Rheumatology ROS completed.   PMFS History:  Patient Active Problem List   Diagnosis Date Noted  . Contusion of left hip 07/25/2019  . H/O multiple allergies 03/10/2019  . Hx of anaphylaxis 03/10/2019  . Cough 06/26/2018  . PND (post-nasal drip) 06/26/2018  . Vitamin D deficiency 07/04/2017  . B12 deficiency 07/04/2017  . Abnormal CT scan 06/19/2017  . Gastroesophageal reflux disease 06/19/2017  . Antiplatelet or antithrombotic long-term use 06/19/2017  . Nasal congestion 03/15/2017  . 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 Sleep difficulties 02/01/2017  . No energy 02/01/2017  . Osteopenia 02/01/2017  . Hypertension, essential 05/08/2016  . IDDM (insulin dependent diabetes mellitus) ([place]) 05/08/2016  .  Coronary artery disease involving native coronary artery of native heart with angina pectoris ([place])   . Hyperthyroidism 11/09/2010  . SINUS TACHYCARDIA 11/08/2010  . DERMATITIS, ALLERGIC 07/20/2010  . EDEMA 07/12/2010  . DIZZINESS 11/14/2009  . ATRIAL FIBRILLATION 07/20/2009  . CAD, ARTERY BYPASS GRAFT 06/07/2009  . ANGINA, STABLE/EXERTIONAL 06/02/2009  . HYPERLIPIDEMIA-MIXED 04/26/2009  . ACUT MI ANTEROLAT WALL SUBSQT EPIS CARE 04/26/2009  . Chest pain 04/26/2009  . DIABETIC  RETINOPATHY 04/25/2009  . CARPAL TUNNEL SYNDROME, BILATERAL 04/25/2009  . TRIGGER FINGER 04/25/2009  . MIGRAINE W/O AURA W/INTRACT W/STATUS MIGRAINOSUS 02/19/2008  . ALLERGIC RHINITIS, SEASONAL 10/16/2006  . CHOLELITHIASIS 10/16/2006  . Rheumatoid arthritis ([place]) 10/16/2006    Past Medical History:  Diagnosis Date  . ACUT MI ANTEROLAT WALL SUBSQT EPIS CARE   . Acute maxillary sinusitis   . ALLERGIC RHINITIS, SEASONAL   . ARTHRITIS, RHEUMATOID   . Atrial fibrillation ([place])    a. after CABG.  . Back pain   . CAD, ARTERY BYPASS GRAFT    a. DES to RCA in 2010 then LAD occlusion s/p CABG 3 06/07/2009 with LIMA to LAD, reverse SVG to D1, reverse SVG to distal RCA. b. Cath 05/08/2016 slightly hypodense region in the intermediate branch, however she had excellent flow, FFR was normal. Vein graft to PDA and the posterolateral branch is patent, patent LIMA to LAD, occluded SVG to diagonal.  . CARPAL TUNNEL SYNDROME, BILATERAL   . CHOLELITHIASIS   . Contrast media allergy   . DERMATITIS, ALLERGIC   . DIABETES MELLITUS, TYPE I    on insulin pump  . DIABETIC  RETINOPATHY   . Hiatal hernia   .  HYPERLIPIDEMIA-MIXED   . HYPERTHYROIDISM   . Insulin pump in place   . MIGRAINE W/O AURA W/INTRACT W/STATUS MIGRAINOSUS 02/19/2008  . SINUS TACHYCARDIA 11/08/2010  . TRIGGER FINGER   . URI     Family History  Problem Relation Age of Onset  . Colon polyps Father   . Diabetes Other   . Heart disease Other   . Hypertension  Other   . Hyperlipidemia Other   . Depression Other   . Migraines Other   . Stroke Paternal Grandmother   . Heart attack Neg Hx   . Colon cancer Neg Hx   . Stomach cancer Neg Hx   . Esophageal cancer Neg Hx    Past Surgical History:  Procedure Laterality Date  . ABDOMINAL HYSTERECTOMY    . Caesarean section    . CARDIAC CATHETERIZATION N/A 05/08/2016   Procedure: Left Heart Cath and Cors/Grafts Angiography;  Surgeon: Burnell Blanks, MD;  Location: [facility];  Service: Cardiovascular;  Laterality: N/A;  . CARPAL TUNNEL RELEASE    . CHOLECYSTECTOMY    . CORONARY ARTERY BYPASS GRAFT    . VITRECTOMY     Social History   Social History Narrative   Divorced. Has 2 kids(fraternal twins) daughters age 50. Works at [employer], Never smoked, denies ETOH, no drugs. Drinks diet coke. No exercise.       Immunization History  Administered Date(s) Administered  . Influenza, Quadrivalent, Recombinant, Inj, Pf 10/14/2013  . Influenza, Seasonal, Injecte, Preservative Fre 09/19/2016  . Influenza,inj,Quad PF,6+ Mos 09/30/2014, 09/03/2018  . Influenza,inj,quad, With Preservative 09/23/2017  . Influenza-Unspecified 10/13/2013, 10/04/2014, 09/30/2016  . Pneumococcal Conjugate-13 07/16/2016  . Pneumococcal Polysaccharide-23 01/01/2004, 10/15/2006  . Td 10/01/2003  . Tdap 01/09/2011, 06/25/2018     Objective: Vital Signs: There were no vitals taken for this visit.   Physical Exam   Musculoskeletal Exam: ***  CDAI Exam: CDAI Score: - Patient Global: -; Provider Global: - Swollen: -; Tender: - Joint Exam   No joint exam has been documented for this visit   There is currently no information documented on the homunculus. Go to the Rheumatology activity and complete the homunculus joint exam.  Investigation: No additional findings.  Imaging: No results found.  Recent Labs: Lab Results  Component Value Date   WBC 5.5 08/12/2019   HGB 12.7 08/12/2019    PLT 145 (L) 08/12/2019   NA 137 08/12/2019   K 3.8 08/12/2019   CL 103 08/12/2019   CO2 23 08/12/2019   GLUCOSE 124 (H) 08/12/2019   BUN 10 08/12/2019   CREATININE 0.71 08/12/2019   BILITOT 0.7 08/12/2019   ALKPHOS 88 08/12/2019   AST 19 08/12/2019   ALT 16 08/12/2019   PROT 6.9 08/12/2019   ALBUMIN 4.1 08/12/2019   CALCIUM 8.9 08/12/2019   GFRAA >60 08/12/2019   QFTBGOLDPLUS NEGATIVE 07/31/2018    Speciality Comments: PLQ Eye Exam: 01/06/19 WNL @ [facility]  Prior Therapy: MTX (nausea), Morrie Sheldon (GI upset), Plaquneil (diarrhea), Orencia/Remicade/Humria/ Cimzia (inadequate response)  Procedures:  No procedures performed Allergies: Prochlorperazine, Ramipril, Shellfish-derived products, Atorvastatin, Compazine  [prochlorperazine edisylate], Etanercept, Infliximab, Iohexol, Orencia [abatacept], Shellfish allergy, Tofacitinib, Emgality [galcanezumab-gnlm], Prochlorperazine edisylate, Trokendi xr [topiramate er], Amiodarone, and Rituximab   Assessment / Plan:     Visit Diagnoses: No diagnosis found.  Orders: No orders of the defined types were placed in this encounter.  No orders of the defined types were placed in this encounter.   Face-to-face  time spent with patient was *** minutes. Greater than 50% of time was spent in counseling and coordination of care.  Follow-Up Instructions: No follow-ups on file.   Jecenia Leamer C Sullivan Blasing, CMA  Note - This record has been created using Dragon software.  Chart creation errors have been sought, but may not always  have been located. Such creation errors do not reflect on  the standard of medical care. 

## 2019-10-07 ENCOUNTER — Encounter: Payer: Self-pay | Admitting: Neurology

## 2019-10-07 ENCOUNTER — Encounter: Payer: Self-pay | Admitting: Physician Assistant

## 2019-10-07 ENCOUNTER — Telehealth: Payer: Self-pay | Admitting: Pharmacist

## 2019-10-07 ENCOUNTER — Ambulatory Visit: Payer: 59 | Attending: Family Medicine | Admitting: Physical Therapy

## 2019-10-07 ENCOUNTER — Ambulatory Visit: Payer: 59 | Admitting: Physician Assistant

## 2019-10-07 ENCOUNTER — Ambulatory Visit: Payer: 59 | Admitting: Neurology

## 2019-10-07 ENCOUNTER — Encounter: Payer: Self-pay | Admitting: Physical Therapy

## 2019-10-07 ENCOUNTER — Other Ambulatory Visit: Payer: Self-pay

## 2019-10-07 VITALS — BP 128/72 | HR 65 | Resp 13 | Ht 63.0 in | Wt 171.4 lb

## 2019-10-07 VITALS — BP 135/74 | HR 61 | Temp 97.1°F | Ht 63.0 in | Wt 171.0 lb

## 2019-10-07 DIAGNOSIS — G4709 Other insomnia: Secondary | ICD-10-CM

## 2019-10-07 DIAGNOSIS — G43909 Migraine, unspecified, not intractable, without status migrainosus: Secondary | ICD-10-CM | POA: Insufficient documentation

## 2019-10-07 DIAGNOSIS — G43709 Chronic migraine without aura, not intractable, without status migrainosus: Secondary | ICD-10-CM | POA: Diagnosis not present

## 2019-10-07 DIAGNOSIS — M25652 Stiffness of left hip, not elsewhere classified: Secondary | ICD-10-CM | POA: Insufficient documentation

## 2019-10-07 DIAGNOSIS — M25552 Pain in left hip: Secondary | ICD-10-CM | POA: Diagnosis not present

## 2019-10-07 DIAGNOSIS — Z8639 Personal history of other endocrine, nutritional and metabolic disease: Secondary | ICD-10-CM

## 2019-10-07 DIAGNOSIS — Z8679 Personal history of other diseases of the circulatory system: Secondary | ICD-10-CM

## 2019-10-07 DIAGNOSIS — Z79899 Other long term (current) drug therapy: Secondary | ICD-10-CM | POA: Diagnosis not present

## 2019-10-07 DIAGNOSIS — Z7902 Long term (current) use of antithrombotics/antiplatelets: Secondary | ICD-10-CM

## 2019-10-07 DIAGNOSIS — M0609 Rheumatoid arthritis without rheumatoid factor, multiple sites: Secondary | ICD-10-CM | POA: Diagnosis not present

## 2019-10-07 DIAGNOSIS — R262 Difficulty in walking, not elsewhere classified: Secondary | ICD-10-CM | POA: Diagnosis present

## 2019-10-07 DIAGNOSIS — M6281 Muscle weakness (generalized): Secondary | ICD-10-CM | POA: Diagnosis present

## 2019-10-07 DIAGNOSIS — M8589 Other specified disorders of bone density and structure, multiple sites: Secondary | ICD-10-CM | POA: Diagnosis not present

## 2019-10-07 DIAGNOSIS — IMO0002 Reserved for concepts with insufficient information to code with codable children: Secondary | ICD-10-CM | POA: Insufficient documentation

## 2019-10-07 DIAGNOSIS — L409 Psoriasis, unspecified: Secondary | ICD-10-CM

## 2019-10-07 DIAGNOSIS — R5383 Other fatigue: Secondary | ICD-10-CM

## 2019-10-07 DIAGNOSIS — Z8719 Personal history of other diseases of the digestive system: Secondary | ICD-10-CM | POA: Diagnosis not present

## 2019-10-07 DIAGNOSIS — R21 Rash and other nonspecific skin eruption: Secondary | ICD-10-CM

## 2019-10-07 DIAGNOSIS — Z8669 Personal history of other diseases of the nervous system and sense organs: Secondary | ICD-10-CM

## 2019-10-07 MED ORDER — LAMOTRIGINE 100 MG PO TABS: 100.0000 mg | ORAL_TABLET | Freq: Two times a day (BID) | ORAL | 11 refills | Status: DC

## 2019-10-07 MED ORDER — CAMBIA 50 MG PO PACK: 50.0000 mg | PACK | ORAL | 11 refills | Status: DC | PRN

## 2019-10-07 MED ORDER — LAMOTRIGINE 25 MG PO TABS: ORAL_TABLET | ORAL | 0 refills | Status: DC

## 2019-10-07 NOTE — Therapy (Signed)
[facility] [address], [place], [address] Phone: [phone]   Fax:  [phone]  Physical Therapy Treatment / Progress Note / Re-Certification Reporting period: 08/19/2019 - 10/07/2019  Patient Details  Name: [name] MRN: [id number] Date of Birth: 07-12-1966 Referring Provider (PT): Gregor Hams, MD   Encounter Date: 10/07/2019  PT End of Session - 10/07/19 1835    Visit Number  8    Number of Visits  20    Date for PT Re-Evaluation  11/18/19    Authorization Type  [employer] reporting period from 08/21/2019 (new reporting period from 10/07/2019)    Authorization Time Period  Current Cert Period: [id number] - 11/18/2019 (latest PN: 11/18/2019)    Authorization - Visit Number  8    Authorization - Number of Visits  10    PT Start Time  [id number]    PT Stop Time  1925    PT Time Calculation (min)  50 min    Activity Tolerance  Patient tolerated treatment well;Patient limited by pain    Behavior During Therapy  [facility] for tasks assessed/performed       Past Medical History:  Diagnosis Date  . ACUT MI ANTEROLAT WALL SUBSQT EPIS CARE   . Acute maxillary sinusitis   . ALLERGIC RHINITIS, SEASONAL   . ARTHRITIS, RHEUMATOID   . Atrial fibrillation ([place])    a. after CABG.  . Back pain   . CAD, ARTERY BYPASS GRAFT    a. DES to RCA in 2010 then LAD occlusion s/p CABG 3 06/07/2009 with LIMA to LAD, reverse SVG to D1, reverse SVG to distal RCA. b. Cath 05/08/2016 slightly hypodense region in the intermediate branch, however she had excellent flow, FFR was normal. Vein graft to PDA and the posterolateral branch is patent, patent LIMA to LAD, occluded SVG to diagonal.  . CARPAL TUNNEL SYNDROME, BILATERAL   . CHOLELITHIASIS   . Contrast media allergy   . DERMATITIS, ALLERGIC   . DIABETES MELLITUS, TYPE I    on insulin pump  . DIABETIC  RETINOPATHY   . Hiatal hernia   . HYPERLIPIDEMIA-MIXED   .  HYPERTHYROIDISM   . Insulin pump in place   . MIGRAINE W/O AURA W/INTRACT W/STATUS MIGRAINOSUS 02/19/2008  . SINUS TACHYCARDIA 11/08/2010  . TRIGGER FINGER   . URI     Past Surgical History:  Procedure Laterality Date  . ABDOMINAL HYSTERECTOMY    . Caesarean section    . CARDIAC CATHETERIZATION N/A 05/08/2016   Procedure: Left Heart Cath and Cors/Grafts Angiography;  Surgeon: Burnell Blanks, MD;  Location: [facility];  Service: Cardiovascular;  Laterality: N/A;  . CARPAL TUNNEL RELEASE    . CHOLECYSTECTOMY    . CORONARY ARTERY BYPASS GRAFT    . VITRECTOMY      There were no vitals filed for this visit.  Subjective Assessment - 10/07/19 1837    Subjective  Patient reports her RA has kicked in and her wrists and fingers were swollen so she has just been to see her rehumatologist who is changing her medication. She fell again on Saturday while she was moving a matress and fell backwards after tripping. She hit her back, head, and arm. She hit right in the middle of her buttocks. She feels like it made her hip pain a bit worse and it seems to be clicking a bit more. She is a bit sore from the fall but  recovering some. She felt pretty good after last treatment session. Patient states she feels like physical therapy is helping. She states she can get up out of a chair pretty well and her pain is improving gradually. She felt a lot of relief from manual therapy following last treatment sesion. She would like to continue PT. States her L pain is closer to her left glute toaday. She states she doesn't feel she is recovering from her last fell well with no serious concerns about injury.    Pertinent History  [name] who presents to outpatient physical therapy with a referral for medical diagnosis left hip pain. This patient's chief complaints consist of left hip/thigh pain, weakness, stiffness, decreased function, and popping following fall off stool onto L hip while  hanging curtains leading to the following functional deficits difficulty with ADLs, IADLs, work activities, housework, Haematologist, hobbies, sitting, standing, transferring, traveling, walking, running, bending, lifting, sleeping. Relevant past medical history and comorbidities include rheumatoid arthritis, DM type I (insulin pump with port at left abdomen), graves disease currently bothering her, steroid use; History of migraines, history of MI and open heart surgery (no symptoms from that or lingering problems, attributed to complications from DMI), diabetic retinopathy (currently doing well), osteopenia (last scan was good).    Limitations  Sitting;Walking;Lifting;House hold activities;Standing;Other (comment)    How long can you sit comfortably?  sitting is worse than standing, uncomfortable    How long can you stand comfortably?  unable to stand comfortably, is able to complete work duties with pain and difficulty    How long can you walk comfortably?  unable to walk comfortably, is able to complete work duties with pain and difficulty    Diagnostic tests  L hip radiograph report 07/14/2019: "Impression: These findings are consistent with mild osteoarthritis of the left hip joint."    Patient Stated Goals  want not to hurt and be able to go through her daily life without any problems    Currently in Pain?  Yes    Pain Score  7     Pain Location  Other (Comment)   everywhere, hip feels pretty good compared to the rest of her body   Pain Descriptors / Indicators  Aching    Pain Type  Chronic pain;Other (Comment)   RA flair   Pain Radiating Towards  L glute, occasional over Lateral hip and anterior proximal thigh    Pain Onset  More than a month ago    Pain Frequency  Intermittent    Aggravating Factors   transitioning from sit to stand (much improved), going up stairs, putting on shoes and socks, pulling foot up and bending over hurts, getting in and out of car (not so bad anymore), bending and  lifting, reaching the floor (okay now), walking (improved), getting on tip-toes to put pressure on abdomen of patient (better)    Effect of Pain on Daily Activities  difficulty with ADLs, IADLs, work activities, housework, Haematologist, hobbies, sitting, standing, transferring, traveling, walking, running, bending, lifting, sleeping (all improving but still hurting).         [facility] PT Assessment - 10/08/19 0001      Assessment   Medical Diagnosis  left hip pain    Referring Provider (PT)  Gregor Hams, MD    Onset Date/Surgical Date  07/15/19    Hand Dominance  Right    Next MD Visit  none scheduled    Prior Therapy  none for this  problem prior to current episode of care      Precautions   Precautions  None      Restrictions   Weight Bearing Restrictions  No      Balance Screen   Has the patient fallen in the past 6 months  Yes    How many times?  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 Has not needed to go on light duty.      Leisure  single mom taking care of 81 kids (3 year olds), trying to get house in order after moving twice, crafting, sewing, yardwork.       Cognition   Overall Cognitive Status  Within Functional Limits for tasks assessed      Observation/Other Assessments   Observations  see note from 10/07/2019 for latest objective data    Focus on Therapeutic Outcomes (FOTO)   FOTO = 47 (08/19/2019); FOTO = 57 (10/07/2019);         TREATMENT:  PERIPHERAL JOINT MOTION (AROM/PROM in degrees):  *Indicates  pain Hip         Flexion: R = 115/115, L = 105/110 pain at lateral thigh.  Extension: L = 20 (knee straight); 25 (knee flex) a little pain  Abduction (PROM): B = WNL except groin pain at lateral L thigh  Adduction (PROM):  L = WNL  External rotation (PROM at 90 degrees flexion): L = 42 groin pain.  Internal rotation (PROM at 90 degrees flexion):  L = 20 feels uncomfortable.   STRENGTH:  *Indicates pain Hip         Flexion: R = 4+/5, L = 4/5 painful.  Extension: R = 4+/5, L = 3+/5 painful.  Abduction: R = 4+/5, L = 3+/5 painful.  Adduction: R = 4+/5, L = 4/5 painful Knee  Ext: R = 5/5, L = 4+/5   Flex: R = 5/5, L = 4/5  Ankle = WFL   Therapeutic exercise: to centralize symptoms and improve ROM, strength, muscular endurance, and activity tolerance required for successful completion of functional activities.  - Hip flexion/extension/adduction/abduction/traction/internal rotation/external rotation x 2 reps each direction and hold for 5s each reps.  - examination to assess progress (see above) - Patient was educated on diagnosis, anatomy and pathology involved, prognosis, role of PT, progress toward her goal. Pt was educated on and agreed to plan of care.  Manual therapy: to reduce pain and tissue tension, improve range of motion, neuromodulation, in order to promote improved ability to complete functional activities. - STM at L anterior thigh from L knee to L groin area. Trigger point identified and friction technique applied for pain control. Patient reported reduced pain at L thigh at end of session.  - Roller assisted trigger point relief from L knee to L groin area.    Clinical impression: Patient has attended 8 skilled physical therapy  treatment sessions this episode of care and overall presents good progress towards stated goals. Subjectively, patient reports feeling pain relief at L hip and able to tolerate increased daily activities. Objectively, patient  demonstrates improvement of tolerance of daily activities(walking/eaching/car transfer/sit to stand); improved ROM (hip internal and external rotation); and improved strengthen(L hip flexion). Patient has been showing good gain in the previous PT treatment, and patient continues to present with significant strength, ROM, pain, activity tolerance impairments that are limiting ability to complete her usual ADLs, IADLs, community participation, manage her home, lift, and prolonged ambulate at her work. Patient will benefit from continued skilled physical therapy intervention to address current body structure impairments and activity limitations to improve function and work towards goals set in current POC in order to return to prior level of function or maximal functional improvement.     PT Education - 10/07/19 2023    Education Details  Exercise purpose/form. Self management techniques. Education on diagnosis, prognosis, POC, anatomy and physiology of current condition    Person(s) Educated  Patient    Methods  Explanation;Demonstration;Tactile cues;Verbal cues    Comprehension  Verbalized understanding;Returned demonstration;Verbal cues required;Tactile cues required       PT Short Term Goals - 09/03/19 1923      PT SHORT TERM GOAL #1   Title  Be independent with initial home exercise program for self-management of symptoms.    Baseline  Initial HEP provided at IE (08/19/2019);    Time  2    Period  Weeks    Status  Achieved    Target Date  09/04/19        PT Long Term Goals - 10/07/19 1900      PT LONG TERM GOAL #1   Title  Be independent with a long-term home exercise program for self-management of symptoms.    Baseline  Initial HEP provided at IE (08/19/2019); Continue progress/update HEP (10/07/2019)    Time  6    Period  Weeks    Status  On-going    Target Date  11/18/19      PT LONG TERM GOAL #2   Title  Demonstrate improved FOTO score to equal or greater than 63% to  demonstrate improvement in overall condition and self-reported functional ability.    Baseline  FOTO = 47 (08/19/2019); 57 (10/07/2019)    Time  6    Period  Weeks    Status  New    Target Date  11/18/19      PT LONG TERM GOAL #3   Title  Have full left hip AROM with no compensations or increase in pain in all planes except intermittent end range discomfort to allow patient to complete valued activities with less difficulty.    Baseline  see objective exam (08/19/2019); see notes from (10/07/2019)    Time  6    Period  Weeks    Status  On-going    Target Date  11/18/19      PT LONG TERM GOAL #4   Title  Patient will demonstrate B LE strength to equal or greater than 4+/5 to demonstrate improved functional strength for improved mobility, increased standing tolerance, and improved ADL ability.    Baseline  see objective exam (08/19/2019); see notes from (10/07/2019)    Time  6    Period  Weeks    Status  On-going    Target Date  11/18/19      PT LONG TERM GOAL #5   Title  Complete community, work and/or recreational activities without limitation due to current condition.    Baseline  difficulty with ADLs, IADLs, work activities, housework, yardwork, hobbies, sitting, standing, transferring, traveling, walking, running, bending, lifting, sleeping (08/19/2019); improving but painful (10/07/2019)    Time  6    Period  Weeks    Status  On-going    Target Date  11/18/19            Plan - 10/07/19 2024    Clinical Impression Statement  Patient has attended 8 skilled physical therapy treatment sessions this episode of care and overall presents good progress towards stated goals. Subjectively, patient reports feeling pain relief at L hip and able to tolerate increased daily activities. Objectively, patient demonstrates improvement of tolerance of daily activities(walking/eaching/car transfer/sit to stand); improved ROM (hip internal and external rotation); and improved strengthen(L hip  flexion). Patient has been showing good gain in the previous PT treatment, and patient continues to present with significant strength, ROM, pain, activity tolerance impairments that are limiting ability to complete her usual ADLs, IADLs, community participation, manage her home, lift, and prolonged ambulate at her work. Patient will benefit from continued skilled physical therapy intervention to address current body structure impairments and activity limitations to improve function and work towards goals set in current POC in order to return to prior level of function or maximal functional improvement.    Personal Factors and Comorbidities  Comorbidity 3+;Profession;Time since onset of injury/illness/exacerbation;Age;Fitness    Comorbidities  rheumatoid arthritis, DM type I (insulin pump with port at left abdomen), graves disease currently bothering her, steroid use; History of migraines, history of MI and open heart surgery (no symptoms from that or lingering problems, attributed to complications from DMI), diabetic retinopathy (currently doing well), osteopenia (last scan was good).    Examination-Activity Limitations  Stairs;Bed Mobility;Dressing;Stand;Bend;Sit;Lift;Caring for Others;Transfers;Sleep;Squat;Carry;Other    Examination-Participation Restrictions  Other;Interpersonal Relationship;Yard Work;Cleaning;Laundry;Community Activity;Shop    Rehab Potential  Fair    PT Frequency  2x / week    PT Duration  6 weeks    PT Treatment/Interventions  ADLs/Self Care Home Management;Aquatic Therapy;Electrical Stimulation;Moist Heat;Cryotherapy;Gait training;Stair training;Functional mobility training;Therapeutic activities;Therapeutic exercise;Balance training;Neuromuscular re-education;Patient/family education;Manual techniques;Passive range of motion;Dry needling;Taping;Spinal Manipulations;Joint Manipulations;Other (comment)    PT Next Visit Plan  progress LE and functional strengthening and stretching as  tolerated    PT Home Exercise Plan  Medbridge Access Code: [email or web address]    Consulted and Agree with Plan of Care  Patient       Patient will benefit from skilled therapeutic intervention in order to improve the following deficits and impairments:  Abnormal gait, Decreased balance, Decreased endurance, Decreased mobility, Difficulty walking, Increased muscle spasms, Impaired perceived functional ability, Obesity, Pain, Impaired flexibility, Decreased strength, Decreased activity tolerance  Visit Diagnosis: Pain in left hip  Stiffness of left hip, not elsewhere classified  Muscle weakness (generalized)  Difficulty in walking, not elsewhere classified     Problem List Patient Active Problem List   Diagnosis Date Noted  . Chronic migraine 10/07/2019  . Contusion of left hip 07/25/2019  . H/O multiple allergies 03/10/2019  . Hx of anaphylaxis 03/10/2019  . Cough 06/26/2018  . PND (post-nasal drip) 06/26/2018  . Vitamin D deficiency 07/04/2017  . B12 deficiency 07/04/2017  . Abnormal CT scan 06/19/2017  . Gastroesophageal reflux disease 06/19/2017  . Antiplatelet or antithrombotic long-term use 06/19/2017  . Nasal congestion 03/15/2017  . Graves disease 02/01/2017  . Sleep difficulties 02/01/2017  . No energy 02/01/2017  .  Osteopenia 02/01/2017  . Hypertension, essential 05/08/2016  . IDDM (insulin dependent diabetes mellitus) 05/08/2016  . Coronary artery disease involving native coronary artery of native heart with angina pectoris ([place])   . Hyperthyroidism 11/09/2010  . SINUS TACHYCARDIA 11/08/2010  . DERMATITIS, ALLERGIC 07/20/2010  . EDEMA 07/12/2010  . DIZZINESS 11/14/2009  . ATRIAL FIBRILLATION 07/20/2009  . CAD, ARTERY BYPASS GRAFT 06/07/2009  . ANGINA, STABLE/EXERTIONAL 06/02/2009  . HYPERLIPIDEMIA-MIXED 04/26/2009  . ACUT MI ANTEROLAT WALL SUBSQT EPIS CARE 04/26/2009  . Chest pain 04/26/2009  . DIABETIC  RETINOPATHY 04/25/2009  . CARPAL TUNNEL SYNDROME, BILATERAL  04/25/2009  . TRIGGER FINGER 04/25/2009  . MIGRAINE W/O AURA W/INTRACT W/STATUS MIGRAINOSUS 02/19/2008  . ALLERGIC RHINITIS, SEASONAL 10/16/2006  . CHOLELITHIASIS 10/16/2006  . Rheumatoid arthritis ([place]) 10/16/2006    Sherrilyn Rist, SPT 10/08/19, 9:21 AM  Everlean Alstrom. Graylon Good, PT, DPT 10/08/19, 9:21 AM   [facility] [address], [place], [address] Phone: [phone]   Fax:  [phone]  Name: KADAJAH WILBOURN MRN: [id number] Date of Birth: 10-08-1966

## 2019-10-07 NOTE — Telephone Encounter (Signed)
Submitted a Prior Pre-certification request to [employer] for [employer] via Telephone. Will update once we receive a response.  J codes- [phone] (auth required) and [id number] (no auth required for admin code)  Auth# [id number]  [facility] [place] Phone# [phone] Fax# [phone]  11:59 AM Beatriz Chancellor, CPhT

## 2019-10-07 NOTE — Telephone Encounter (Signed)
Please start [facility]IV for Actemra 4mg /kg every 28 days.  Prior Therapy: methotrexate/Xeljanz/Actemra (GI upset), Enbrel (injection site reaction) and Orencia/Remicade/Humira/Cimzia/Rituxan (inadequate response).

## 2019-10-07 NOTE — Progress Notes (Signed)
Pharmacy Note Subjective: Patient presents today to the [facility] to see Dr. Estanislado Pandy.   Patient seen by the pharmacist for counseling on Actemra for rheumatoid arthritis.  She is currently on Simponi, Arava, and Plaquenil with inadequate response.  She has tried and failed multiple medications majority discontinued due to GI upset and inadequate response.  Prior therapy includes: methotrexate/Xeljanz/Actemra subq(GI upset), Enbrel (injection site reaction) and Orencia/Remicade/Humira/Cimzia/Rituxan (inadequate response).    Objective:  CBC    Component Value Date/Time   WBC 5.5 08/12/2019 1648   RBC 4.23 08/12/2019 1648   HGB 12.7 08/12/2019 1648   HCT 37.4 08/12/2019 1648   PLT 145 (L) 08/12/2019 1648   MCV 88.4 08/12/2019 1648   MCH 30.0 08/12/2019 1648   MCHC 34.0 08/12/2019 1648   RDW 12.4 08/12/2019 1648   LYMPHSABS 2.2 08/12/2019 1648   MONOABS 0.4 08/12/2019 1648   EOSABS 0.2 08/12/2019 1648   BASOSABS 0.1 08/12/2019 1648    CMP     Component Value Date/Time   NA 137 08/12/2019 1648   NA 142 07/16/2016   K 3.8 08/12/2019 1648   CL 103 08/12/2019 1648   CO2 23 08/12/2019 1648   GLUCOSE 124 (H) 08/12/2019 1648   BUN 10 08/12/2019 1648   BUN 7 07/16/2016   CREATININE 0.71 08/12/2019 1648   CREATININE 0.88 05/21/2019 1417   CALCIUM 8.9 08/12/2019 1648   PROT 6.9 08/12/2019 1648   ALBUMIN 4.1 08/12/2019 1648   AST 19 08/12/2019 1648   ALT 16 08/12/2019 1648   ALKPHOS 88 08/12/2019 1648   BILITOT 0.7 08/12/2019 1648   GFRNONAA >60 08/12/2019 1648   GFRNONAA 76 05/21/2019 1417   GFRAA >60 08/12/2019 1648   GFRAA 88 05/21/2019 1417     Baseline Immunosuppressant Therapy Labs  Quantiferon TB Gold Latest Ref Rng & Units 07/31/2018  Quantiferon TB Gold Plus NEGATIVE NEGATIVE   Hepatitis panel: pending 10/07/2019  HIV: pending 10/07/2019  Immunoglobulins:pending 10/07/2019  SPEP: pending 10/07/2019  No results found for: G6PDH  No results found  for: TPMT   Lipid panel Lab Results  Component Value Date   CHOL 151 05/08/2016   HDL 69 05/08/2016   LDLCALC 62 05/08/2016   TRIG 101 05/08/2016   CHOLHDL 2.2 05/08/2016     Chest x-ray: no pneumonia or effusion 07/10/2018  Assessment/Plan:  Counseled patient that Actemra is an IL-6 blocking agent.  Reviewed Actemra dose of 4 mg/kg every 4 weeks.  Counseled patient on purpose, proper use, and adverse effects of Actemra.  Reviewed the most common adverse effects including infections, injection site reaction, bowel injury, and rarely cancer and conditions of the nervous system.  Reviewed that the medication should be held during infections.  Discussed that there is the possibility of an increased risk of malignancy but it is not well understood if this increased risk is due to the medication or the disease state.  Counseled patient that Actemra should be held prior to scheduled surgery.  Counseled patient to avoid live vaccines while on Actemra.  Recommend patient to get annual influenza vaccine, pneumococcal vaccine and Shingrix as indicated.   Reviewed the importance of regular labs while on Actemra therapy including the need for routine lipid panel. Standing orders placed. Provided patient with medication education material and answered all questions.  Patient voiced understanding.  Patient consented to Actemra.  Will upload consent into the media tab.  Will submit benefits investigation for Actemra.    All questions encouraged and answered.  Instructed patient to call with any questions or concerns.   Mariella Saa, PharmD, [place], [place] Clinical Specialty Pharmacist [phone]  10/07/2019 11:17 AM

## 2019-10-07 NOTE — Progress Notes (Signed)
PATIENT: [name] DOB: 1966/08/23  Chief Complaint  Patient presents with  . New Patient (Initial Visit)    RM 4, alone. PCP/Referring: Iran Planas, PA. Vision: 20/40 bilateral, without correction  . Migraine    1 migraine per week. Headache daily. Has tried emgality, topamax, prednisone. Has nausea, light, sound sensitivity     HISTORICAL  [name] is a 53 year old female, seen in request by her primary care PA Iran Planas for evaluation of chronic migraine headache, initial evaluation was on October 07, 2019.  I have reviewed and summarized the referring note from the referring physician.  She had past medical history of insulin-dependent diabetes, hypertension, hyperlipidemia, coronary artery disease, rheumatoid arthritis, psoriatic arthritis, on polypharmacy treatment  She reported a history of migraine headaches since teenager, her typical migraine bilateral retro-orbital area vertex area severe pounding headache with associated light noise sensitivity, nauseous, lasting for couple hours, recently she been having typical migraine once a week, but in between, she also has similar but milder degree of headaches, she often take ibuprofen as needed for migraine, not a candidate for triptan because of her chronic artery disease.  Previously tried Topamax as preventive medications, she complains of side effect of mental slowing, Topamax was helpful.  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  She denies bilateral feet paresthesia,   REVIEW OF SYSTEMS: Full 14 system review of systems performed and notable only for as above All other review of systems were negative.  ALLERGIES: Allergies  Allergen Reactions  . Prochlorperazine Rash    Neuro problems per pt Neurological reaction  . Ramipril Swelling, Rash and Other (See Comments)  . Shellfish-Derived Products Swelling    Shrimp(Facial swelling) Shrimp(Facial swelling)  . Atorvastatin Rash   Elevated LFT's Elevated LFT's Elevated LFT's  . Compazine  [Prochlorperazine Edisylate] Other (See Comments)    Neurological reaction  . Etanercept Swelling and Rash  . Infliximab Rash  . Iohexol     Iv contrast dye -rash all over  . Orencia [Abatacept] Rash  . Shellfish Allergy Swelling    Shrimp(Facial swelling)  . Tofacitinib Rash and Other (See Comments)    Severe abdominal pain Severe abdominal pain Severe abdominal pain  . Emgality [Galcanezumab-Gnlm] Hives and Swelling  . Prochlorperazine Edisylate     unknown  . [employer]]     [employer], memory issues, word finding issues  . Amiodarone Nausea Only  . Rituximab Rash    Causes a rash    HOME MEDICATIONS: Current Outpatient Medications  Medication Sig Dispense Refill  . clopidogrel (PLAVIX) 75 MG tablet TAKE 1 TABLET BY MOUTH ONCE DAILY 90 tablet 2  . cyclobenzaprine (FLEXERIL) 10 MG tablet TAKE 1 TABLET BY MOUTH AT BEDTIME. 30 tablet 0  . EPINEPHrine 0.3 mg/0.3 mL IJ SOAJ injection Inject 0.3 mLs (0.3 mg total) into the muscle as needed for anaphylaxis. 1 Device prn  . Esomeprazole Magnesium (NEXIUM 24HR PO) Take by mouth daily.    . folic acid (FOLVITE) 1 MG tablet Take 2 tablets (2 mg total) by mouth daily. 180 tablet 3  . furosemide (LASIX) 40 MG tablet Take 40 mg by mouth daily as needed for edema (When she feels like she is swelling).    . Golimumab (SIMPONI) 100 MG/ML SOAJ INJECT ONE DEVICE UNDER THE SKIN ONCE MONTHLY 3 mL 0  . hydroxychloroquine (PLAQUENIL) 200 MG tablet TAKE 1 TABLET BY MOUTH TWICE DAILY. 180 tablet 0  . insulin  lispro (HUMALOG) 100 UNIT/ML injection FOR USE IN INSULIN PUMP. TOTAL DAILY INSULIN DOSE = UP TO 90 UNITS.    Marland Kitchen isosorbide mononitrate (IMDUR) 60 MG 24 hr tablet Take 1 tablet (60 mg total) by mouth daily. 30 tablet 11  . leflunomide (ARAVA) 20 MG tablet TAKE 1 TABLET BY MOUTH ONCE DAILY 30 tablet 0  . losartan (COZAAR) 50 MG tablet TAKE 1 TABLET BY MOUTH ONCE DAILY 90  tablet 2  . methimazole (TAPAZOLE) 10 MG tablet Take by mouth.    . metoprolol tartrate (LOPRESSOR) 50 MG tablet Take 1 tablet (50 mg total) by mouth 2 (two) times daily. 180 tablet 3  . montelukast (SINGULAIR) 10 MG tablet TAKE 1 TABLET BY MOUTH AT BEDTIME 30 tablet 2  . nabumetone (RELAFEN) 750 MG tablet TAKE 1 TABLET BY MOUTH TWICE DAILY 60 tablet 0  . Omega-3 Fatty Acids (FISH OIL) 1200 MG CAPS Take 1,200 mg by mouth 2 (two) times daily.    . ONE TOUCH ULTRA TEST test strip   1  . potassium chloride (K-DUR) 10 MEQ tablet Take 1 tablet (10 mEq total) by mouth daily. (Patient taking differently: 10 mEq as needed. ) 90 tablet 3  . rosuvastatin (CRESTOR) 20 MG tablet Take 1 tablet (20 mg total) by mouth daily. 90 tablet 3  . predniSONE (DELTASONE) 5 MG tablet Take 4 tablets po daily x1wk, 3 tablets po daily x1wk, 2 tablets po daily x1wk, 1 tablet po daily x1wk, 1/2 tablet po daily x1wk (Patient not taking: Reported on 10/07/2019) 74 tablet 0   No current facility-administered medications for this visit.     PAST MEDICAL HISTORY: Past Medical History:  Diagnosis Date  . ACUT MI ANTEROLAT WALL SUBSQT EPIS CARE   . Acute maxillary sinusitis   . ALLERGIC RHINITIS, SEASONAL   . ARTHRITIS, RHEUMATOID   . Atrial fibrillation ([place])    a. after CABG.  . Back pain   . CAD, ARTERY BYPASS GRAFT    a. DES to RCA in 2010 then LAD occlusion s/p CABG 3 06/07/2009 with LIMA to LAD, reverse SVG to D1, reverse SVG to distal RCA. b. Cath 05/08/2016 slightly hypodense region in the intermediate branch, however she had excellent flow, FFR was normal. Vein graft to PDA and the posterolateral branch is patent, patent LIMA to LAD, occluded SVG to diagonal.  . CARPAL TUNNEL SYNDROME, BILATERAL   . CHOLELITHIASIS   . Contrast media allergy   . DERMATITIS, ALLERGIC   . DIABETES MELLITUS, TYPE I    on insulin pump  . DIABETIC  RETINOPATHY   . Hiatal hernia   . HYPERLIPIDEMIA-MIXED   . HYPERTHYROIDISM   . Insulin  pump in place   . MIGRAINE W/O AURA W/INTRACT W/STATUS MIGRAINOSUS 02/19/2008  . SINUS TACHYCARDIA 11/08/2010  . TRIGGER FINGER   . URI     PAST SURGICAL HISTORY: Past Surgical History:  Procedure Laterality Date  . ABDOMINAL HYSTERECTOMY    . Caesarean section    . CARDIAC CATHETERIZATION N/A 05/08/2016   Procedure: Left Heart Cath and Cors/Grafts Angiography;  Surgeon: Burnell Blanks, MD;  Location: [facility];  Service: Cardiovascular;  Laterality: N/A;  . CARPAL TUNNEL RELEASE    . CHOLECYSTECTOMY    . CORONARY ARTERY BYPASS GRAFT    . VITRECTOMY      FAMILY HISTORY: Family History  Problem Relation Age of Onset  . Colon polyps Father   . Diabetes Other   . Heart disease Other   .  Hypertension Other   . Hyperlipidemia Other   . Depression Other   . Migraines Other   . Stroke Paternal Grandmother   . Heart attack Neg Hx   . Colon cancer Neg Hx   . Stomach cancer Neg Hx   . Esophageal cancer Neg Hx     SOCIAL HISTORY: Social History   Socioeconomic History  . Marital status: Divorced    Spouse name: Not on file  . Number of children: Not on file  . Years of education: Not on file  . Highest education level: Not on file  Occupational History  . Not on file  Social Needs  . Financial resource strain: Not on file  . Food insecurity    Worry: Not on file    Inability: Not on file  . Transportation needs    Medical: Not on file    Non-medical: Not on file  Tobacco Use  . Smoking status: Never Smoker  . Smokeless tobacco: Never Used  Substance and Sexual Activity  . Alcohol use: Yes    Comment: rarely 1 every 6 months  . Drug use: No  . Sexual activity: Not Currently    Partners: Male    Birth control/protection: None  Lifestyle  . Physical activity    Days per week: Not on file    Minutes per session: Not on file  . Stress: Not on file  Relationships  . Social Herbalist on phone: Not on file    Gets together: Not on file     Attends religious service: Not on file    Active member of club or organization: Not on file    Attends meetings of clubs or organizations: Not on file    Relationship status: Not on file  . Intimate partner violence    Fear of current or ex partner: Not on file    Emotionally abused: Not on file    Physically abused: Not on file    Forced sexual activity: Not on file  Other Topics Concern  . Not on file  Social History Narrative   Divorced. Has 2 kids(fraternal twins) daughters age 13. Works at [employer], Never smoked, denies ETOH, no drugs. Drinks diet coke. No exercise.         PHYSICAL EXAM   Vitals:   10/07/19 0727  BP: 135/74  Pulse: 61  Temp: (!) 97.1 F (36.2 C)  Weight: 171 lb (77.6 kg)  Height: 5\' 3"  (1.6 m)    Not recorded      Body mass index is 30.29 kg/m.  PHYSICAL EXAMNIATION:  Gen: NAD, conversant, well nourised, well groomed                     Cardiovascular: Regular rate rhythm, no peripheral edema, warm, nontender. Eyes: Conjunctivae clear without exudates or hemorrhage Neck: Supple, no carotid bruits. Pulmonary: Clear to auscultation bilaterally   NEUROLOGICAL EXAM:  MENTAL STATUS: Speech:    Speech is normal; fluent and spontaneous with normal comprehension.  Cognition:     Orientation to time, place and person     Normal recent and remote memory     Normal Attention span and concentration     Normal Language, naming, repeating,spontaneous speech     Fund of knowledge   CRANIAL NERVES: CN II: Visual fields are full to confrontation.  Pupils are round equal and briskly reactive to light. CN III, IV, VI: extraocular movement are normal. No ptosis.  CN V: Facial sensation is intact to pinprick in all 3 divisions bilaterally. Corneal responses are intact.  CN VII: Face is symmetric with normal eye closure and smile. CN VIII: Hearing is normal to causal conversation. CN IX, X: Palate elevates symmetrically. Phonation is  normal. CN XI: Head turning and shoulder shrug are intact CN XII: Tongue is midline with normal movements and no atrophy.  MOTOR: There is no pronator drift of out-stretched arms. Muscle bulk and tone are normal. Muscle strength is normal.  REFLEXES: Reflexes are 2+ and symmetric at the biceps, triceps, knees, and ankles. Plantar responses are flexor.  SENSORY: Intact to light touch, pinprick, positional sensation and vibratory sensation are intact in fingers and toes.  COORDINATION: Rapid alternating movements and fine finger movements are intact. There is no dysmetria on finger-to-nose and heel-knee-shin.    GAIT/STANCE: Posture is normal. Gait is steady with normal steps, base, arm swing, and turning. Heel and toe walking are normal. Tandem gait is normal.  Romberg is absent.   DIAGNOSTIC DATA (LABS, IMAGING, TESTING) - I reviewed patient records, labs, notes, testing and imaging myself where available.   ASSESSMENT AND PLAN  RENEKA HENDRIX is a 53 y.o. female    Chronic migraine headaches  Start preventive medication lamotrigine titrating to 100 mg twice a day  Cambia 50 mg as needed     Marcial Pacas, M.D. Ph.D.  [facility] [address] [place], [place] [address] Ph: [phone] Fax: [phone]  CC: Referring Provider

## 2019-10-07 NOTE — Progress Notes (Signed)
Office Visit Note  Patient: [name]             Date of Birth: 1966/05/05           MRN: [id number]             PCP: Lavada Mesi Referring: Donella Stade, PA-C Visit Date: 10/07/2019 Occupation: @GUAROCC @  Subjective:  Pain in multiple joints   History of Present Illness: [name] is a 53 y.o. female with history of seronegative rheumatoid arthritis. She is on Simponi subcutaneous injections every 28 days, Arava 20 mg 1 tablet by mouth daily and Plaquenil 200 mg 1 tablet twice daily.  She presents today with pain in multiple joints.  He is having increased pain in bilateral hands, bilateral wrist joints, bilateral shoulder joints, bilateral ankle joints.  She has noticed swelling in both hands and both wrists.  She states that the pain has been so severe and she is unable to make a complete fist so she called out of work yesterday.  She states she has also been having increased neck pain.  She was given a prednisone taper starting at 20 mg tapering by 5 mg every week on 07/14/2019.  She states that prednisone seems to be" losing its effect." In the past she has tried Humira, Enbrel, Orencia, Remicade, and Bessie.  She briefly tried Actemra several years ago but discontinued due to GI side effects.  She experienced severe GI side effects with Morrie Sheldon as well. She says she is also concerned because she has noticed a facial rash.  She continues to have intermittent symptoms of Raynaud's.  She denies any sores in her mouth or nose.  She does have eye dryness but no mouth dryness.  She denies any photosensitivity.  She has chronic fatigue but has not noticed any fevers or swollen lymph nodes.      Activities of Daily Living:  Patient reports morning stiffness for 1 hour.   Patient Reports nocturnal pain.  Difficulty dressing/grooming: Denies Difficulty climbing stairs: Reports Difficulty getting out of chair: Reports Difficulty using hands for taps, buttons, cutlery,  and/or writing: Reports  Review of Systems  Constitutional: Positive for fatigue.  HENT: Negative for mouth sores, mouth dryness and nose dryness.   Eyes: Positive for itching and dryness. Negative for pain.  Respiratory: Negative for shortness of breath, wheezing and difficulty breathing.   Cardiovascular: Negative for chest pain and palpitations.  Gastrointestinal: Negative for abdominal pain, blood in stool, constipation and diarrhea.  Endocrine: Negative for increased urination.  Genitourinary: Negative for difficulty urinating and painful urination.  Musculoskeletal: Positive for arthralgias, joint pain, joint swelling and morning stiffness.  Skin: Negative for rash.  Allergic/Immunologic: Negative for susceptible to infections.  Neurological: Positive for headaches and weakness. Negative for dizziness, light-headedness, numbness and memory loss.  Hematological: Positive for bruising/bleeding tendency.  Psychiatric/Behavioral: Positive for sleep disturbance. Negative for confusion.    PMFS History:  Patient Active Problem List   Diagnosis Date Noted   Chronic migraine 10/07/2019   Contusion of left hip 07/25/2019   H/O multiple allergies 03/10/2019   Hx of anaphylaxis 03/10/2019   Cough 06/26/2018   PND (post-nasal drip) 06/26/2018   Vitamin D deficiency 07/04/2017   B12 deficiency 07/04/2017   Abnormal CT scan 06/19/2017   Gastroesophageal reflux disease 06/19/2017   Antiplatelet or antithrombotic long-term use 06/19/2017   Nasal congestion 03/15/2017   Graves disease 02/01/2017   Sleep difficulties 02/01/2017   No energy  02/01/2017   Osteopenia 02/01/2017   Hypertension, essential 05/08/2016   IDDM (insulin dependent diabetes mellitus) 05/08/2016   Coronary artery disease involving native coronary artery of native heart with angina pectoris ([place])    Hyperthyroidism 11/09/2010   SINUS TACHYCARDIA 11/08/2010   DERMATITIS, ALLERGIC 07/20/2010    EDEMA 07/12/2010   DIZZINESS 11/14/2009   ATRIAL FIBRILLATION 07/20/2009   CAD, ARTERY BYPASS GRAFT 06/07/2009   ANGINA, STABLE/EXERTIONAL 06/02/2009   HYPERLIPIDEMIA-MIXED 04/26/2009   ACUT MI ANTEROLAT WALL SUBSQT EPIS CARE 04/26/2009   Chest pain 04/26/2009   DIABETIC  RETINOPATHY 04/25/2009   CARPAL TUNNEL SYNDROME, BILATERAL 04/25/2009   TRIGGER FINGER 04/25/2009   MIGRAINE W/O AURA W/INTRACT W/STATUS MIGRAINOSUS 02/19/2008   ALLERGIC RHINITIS, SEASONAL 10/16/2006   CHOLELITHIASIS 10/16/2006   Rheumatoid arthritis ([place]) 10/16/2006    Past Medical History:  Diagnosis Date   ACUT MI ANTEROLAT WALL SUBSQT EPIS CARE    Acute maxillary sinusitis    ALLERGIC RHINITIS, SEASONAL    ARTHRITIS, RHEUMATOID    Atrial fibrillation ([place])    a. after CABG.   Back pain    CAD, ARTERY BYPASS GRAFT    a. DES to RCA in 2010 then LAD occlusion s/p CABG 3 06/07/2009 with LIMA to LAD, reverse SVG to D1, reverse SVG to distal RCA. b. Cath 05/08/2016 slightly hypodense region in the intermediate branch, however she had excellent flow, FFR was normal. Vein graft to PDA and the posterolateral branch is patent, patent LIMA to LAD, occluded SVG to diagonal.   CARPAL TUNNEL SYNDROME, BILATERAL    CHOLELITHIASIS    Contrast media allergy    DERMATITIS, ALLERGIC    DIABETES MELLITUS, TYPE I    on insulin pump   DIABETIC  RETINOPATHY    Hiatal hernia    HYPERLIPIDEMIA-MIXED    HYPERTHYROIDISM    Insulin pump in place    MIGRAINE W/O AURA W/INTRACT W/STATUS MIGRAINOSUS 02/19/2008   SINUS TACHYCARDIA 11/08/2010   TRIGGER FINGER    URI     Family History  Problem Relation Age of Onset   Colon polyps Father    Diabetes Other    Heart disease Other    Hypertension Other    Hyperlipidemia Other    Depression Other    Migraines Other    Stroke Paternal Grandmother    Heart attack Neg Hx    Colon cancer Neg Hx    Stomach cancer Neg Hx    Esophageal  cancer Neg Hx    Past Surgical History:  Procedure Laterality Date   ABDOMINAL HYSTERECTOMY     Caesarean section     CARDIAC CATHETERIZATION N/A 05/08/2016   Procedure: Left Heart Cath and Cors/Grafts Angiography;  Surgeon: Burnell Blanks, MD;  Location: [facility];  Service: Cardiovascular;  Laterality: N/A;   CARPAL TUNNEL RELEASE     CHOLECYSTECTOMY     CORONARY ARTERY BYPASS GRAFT     VITRECTOMY     Social History   Social History Narrative   Divorced. Has 2 kids(fraternal twins) daughters age 74. Works at [employer], Never smoked, denies ETOH, no drugs. Drinks diet coke. No exercise.       Immunization History  Administered Date(s) Administered   Influenza, Quadrivalent, Recombinant, Inj, Pf 10/14/2013   Influenza, Seasonal, Injecte, Preservative Fre 09/19/2016   Influenza,inj,Quad PF,6+ Mos 09/30/2014, 09/03/2018   Influenza,inj,quad, With Preservative 09/23/2017   Influenza-Unspecified 10/13/2013, 10/04/2014, 09/30/2016   Pneumococcal Conjugate-13 07/16/2016   Pneumococcal Polysaccharide-23 01/01/2004, 10/15/2006  Td 10/01/2003   Tdap 01/09/2011, 06/25/2018     Objective: Vital Signs: BP 128/72 (BP Location: Left Arm, Patient Position: Sitting, Cuff Size: Normal)    Pulse 65    Resp 13    Ht 5\' 3"  (1.6 m)    Wt 171 lb 6.4 oz (77.7 kg)    BMI 30.36 kg/m    Physical Exam Vitals signs and nursing note reviewed.  Constitutional:      Appearance: She is well-developed.  HENT:     Head: Normocephalic and atraumatic.  Eyes:     Conjunctiva/sclera: Conjunctivae normal.  Neck:     Musculoskeletal: Normal range of motion.  Cardiovascular:     Rate and Rhythm: Normal rate and regular rhythm.     Heart sounds: Normal heart sounds.  Pulmonary:     Effort: Pulmonary effort is normal.     Breath sounds: Normal breath sounds.  Abdominal:     General: Bowel sounds are normal.     Palpations: Abdomen is soft.  Lymphadenopathy:      Cervical: No cervical adenopathy.  Skin:    General: Skin is warm and dry.     Capillary Refill: Capillary refill takes less than 2 seconds.  Neurological:     Mental Status: She is alert and oriented to person, place, and time.  Psychiatric:        Behavior: Behavior normal.      Musculoskeletal Exam: C-spine, thoracic spine, lumbar spine good range of motion.  No midline spinal tenderness.  No SI joint tenderness.  Shoulder joints have good range of motion with discomfort bilaterally.  Elbow joints have good range of motion with no tenderness or inflammation.  She has limited range of motion bilateral wrist joints.  She has extensor tenosynovitis in the right wrist.  She has tenderness of the right third and fourth MCPs and left second and third MCP joints have synovitis.  Hip joints have good range of motion.  She has discomfort in the left hip with external rotation.  Knee joints have good range of motion with no warmth or effusion.  She has tenderness of bilateral ankle joints but no inflammation was noted.  CDAI Exam: CDAI Score: 11.6  Patient Global: 8 mm; Provider Global: 8 mm Swollen: 3 ; Tender: 9  Joint Exam      Right  Left  Glenohumeral   Tender   Tender  Wrist  Swollen Tender     MCP 2     Swollen Tender  MCP 3   Tender  Swollen Tender  MCP 4   Tender     Ankle   Tender   Tender     Investigation: No additional findings.  Imaging: No results found.  Recent Labs: Lab Results  Component Value Date   WBC 5.5 08/12/2019   HGB 12.7 08/12/2019   PLT 145 (L) 08/12/2019   NA 137 08/12/2019   K 3.8 08/12/2019   CL 103 08/12/2019   CO2 23 08/12/2019   GLUCOSE 124 (H) 08/12/2019   BUN 10 08/12/2019   CREATININE 0.71 08/12/2019   BILITOT 0.7 08/12/2019   ALKPHOS 88 08/12/2019   AST 19 08/12/2019   ALT 16 08/12/2019   PROT 6.9 08/12/2019   ALBUMIN 4.1 08/12/2019   CALCIUM 8.9 08/12/2019   GFRAA >60 08/12/2019   QFTBGOLDPLUS NEGATIVE 07/31/2018     Speciality Comments: PLQ Eye Exam: 01/06/19 WNL @ [facility]  Prior Therapy: methotrexate/Xeljanz/Actemra (GI upset), Enbrel (injection  site reaction) and Orencia/Remicade/Humira/Cimzia/Rituxan (inadequate response).    Procedures:  No procedures performed Allergies: Prochlorperazine, Ramipril, Shellfish-derived products, Atorvastatin, Compazine  [prochlorperazine edisylate], Etanercept, Infliximab, Iohexol, Orencia [abatacept], Shellfish allergy, Tofacitinib, Emgality [galcanezumab-gnlm], Prochlorperazine edisylate, Trokendi xr [topiramate er], Amiodarone, and Rituximab     Assessment / Plan:     Visit Diagnoses: Rheumatoid arthritis of multiple sites with negative rheumatoid factor (HCC) - RF negative, anti-CCP negative, positive ANA: She has synovitis as described above.  She has been having increased pain and stiffness in multiple joints for the past several weeks.  She states the pain was so severe yesterday she had to call out of work.  She denies missing any doses of Simponi 100 mg every days injections every 28 days, Arava 20 mg 1 tablet by mouth daily, Plaquenil 200 mg 1 tablet twice daily.  She was given a prednisone taper on 07/14/2019 starting at 20 mg tapering by 5 mg every week.  She states it seems as though prednisone is "losing its effect."  She has been having recurrent flares and does not feel as though the current combination has been effective.  In the past she has taken Enbrel, Humira, Orencia, Xeljanz, Remicade,, and Actemra subcu injections.  In the past she had a reaction to Enbrel and discontinued.  She has continued [place] and Actemra subcu injections due to GI side effects.  We discussed that there are limited treatment options left at this point.  She would like to retry Actemra.  We will apply for Actemra IV infusions and notify her once approved.  We will start her on Actemra 4 mg/kg every 4 weeks.  All questions were addressed and consent was  obtained today.  She will continue taking Arava and Plaquenil as prescribed.  Due to her history of diabetes we will avoid sending in another prednisone taper at this time.  She will follow-up in the office in 2 months.  Counseled patient that Actemra is an IL-6 blocking agent.  Reviewed Actemra dose of 4 mg/kg every 4 weeks.  Counseled patient on purpose, proper use, and adverse effects of Actemra.  Reviewed the most common adverse effects including infections, injection site reaction, bowel injury, and rarely cancer and conditions of the nervous system.  Reviewed that the medication should be held during infections.  Discussed that there is the possibility of an increased risk of malignancy but it is not well understood if this increased risk is due to the medication or the disease state.  Counseled patient that Actemra should be held prior to scheduled surgery.  Counseled patient to avoid live vaccines while on Actemra.  Recommend patient to get annual influenza vaccine, pneumococcal vaccine and Shingrix as indicated.   Reviewed the importance of regular labs while on Actemra therapy including the need for routine lipid panel. Standing orders placed.  Provided patient with medication education material and answered all questions.  Patient voiced understanding.  Patient consented to Actemra.  Will upload consent into the media tab.  Will submit benefits investigation for Actemra.    High risk medication use - She will discontinue Simponi 100 mg subcutaneous injections every 28 day and we will apply for actemra IV infusions.  She will continue taking Arava 20 mg 1 tablet by mouth daily and Plaquenil 200 mg 1 tablet twice daily.  We will obtain the following lab work today prior to starting her on Actemra IV infusions.- Plan: Hepatitis panel, acute, HIV Antibody (routine testing w rflx), Serum protein electrophoresis with reflex, IgG,  IgA, IgM  Psoriasis: She has a few patches of psoriasis on the extensor  surface of both elbow joints.  Other fatigue: She has chronic fatigue worsened by insomnia.  Other insomnia: She has been experiencing nocturnal pain which is caused interrupted sleep at night.  Osteopenia of multiple sites: The most recent DEXA on 02/08/19 revealed BMD measured at Femur Neck Left is 0.959 g/cm2 with a T-score of -0.6.  She has a known history of osteopenia and rheumatoid arthritis.  She has been on several prednisone tapers since her last bone density.  An order for bone density will be placed today.  She is having a mammogram on 10/29/2019 and would like to have the DEXA on the same day.  Other medical conditions are listed as follows:  History of vitamin D deficiency  Antiplatelet or antithrombotic long-term use  History of gastroesophageal reflux (GERD)  History of migraine  History of Graves' disease  History of diabetes mellitus  History of coronary artery disease  History of hypertension  History of hyperlipidemia  Orders: Orders Placed This Encounter  Procedures   DG BONE DENSITY (DXA)   Hepatitis panel, acute   HIV Antibody (routine testing w rflx)   Serum protein electrophoresis with reflex   IgG, IgA, IgM   ANA   RNP Antibody   Anti-Smith antibody   Sjogrens syndrome-B extractable nuclear antibody   Sjogrens syndrome-A extractable nuclear antibody   Anti-DNA antibody, double-stranded   C3 and C4   Sedimentation rate   QuantiFERON-TB Gold Plus   No orders of the defined types were placed in this encounter.   Face-to-face time spent with patient was 30 minutes. Greater than 50% of time was spent in counseling and coordination of care.  Follow-Up Instructions: Return in 2 months (on 12/07/2019) for Rheumatoid arthritis.   Ofilia Neas, PA-C   I examined and evaluated the patient with Hazel Sams PA.  Patient continues to have active synovitis despite being on combination therapy.  She is failed multiple medications in the  past was listed above.  We had detailed discussion regarding possible use of Actemra IV.  The challenge of keeping her cholesterol low was also discussed.  After reviewing indications side effects contraindications she was in agreement to proceed with Actemra.  She will discontinue simply prior to starting Actemra.  Indication side effects contraindications of Actemra were discussed at length.  The plan of care was discussed as noted above.  Bo Merino, MD  Note - This record has been created using Editor, commissioning.  Chart creation errors have been sought, but may not always  have been located. Such creation errors do not reflect on  the standard of medical care.

## 2019-10-08 ENCOUNTER — Telehealth: Payer: Self-pay | Admitting: *Deleted

## 2019-10-08 MED ORDER — CAMBIA 50 MG PO PACK: 50.0000 mg | PACK | ORAL | 11 refills | Status: DC | PRN

## 2019-10-08 NOTE — Telephone Encounter (Signed)
Received form [facility] that a covered alternative may be better, may do PA.  We sent another prescription to [facility] for cambia.  I called and LMVM for pt that we did send prescription to [facility] to see if they are able to get for her.  If she has questions to call back.

## 2019-10-09 NOTE — Progress Notes (Signed)
Abnormal SPEP-revealed possible early nephrotic pattern.  Please advise patient to return for urine protein electrophoresis to further assess.  Sed rate WNL.  Complements WNL.  Ro and La are negative. RNP negative.  Smith negative. DsDNA negative.  HIV negative. Hepatitis panel negative.

## 2019-10-10 LAB — ANTI-DNA ANTIBODY, DOUBLE-STRANDED: ds DNA Ab: 1 IU/mL

## 2019-10-10 LAB — PROTEIN ELECTROPHORESIS, SERUM, WITH REFLEX
Albumin ELP: 3.6 g/dL — ABNORMAL LOW (ref 3.8–4.8)
Alpha 1: 0.3 g/dL (ref 0.2–0.3)
Alpha 2: 0.7 g/dL (ref 0.5–0.9)
Beta 2: 0.3 g/dL (ref 0.2–0.5)
Beta Globulin: 0.4 g/dL (ref 0.4–0.6)
Gamma Globulin: 0.5 g/dL — ABNORMAL LOW (ref 0.8–1.7)
Total Protein: 5.8 g/dL — ABNORMAL LOW (ref 6.1–8.1)

## 2019-10-10 LAB — SEDIMENTATION RATE: Sed Rate: 2 mm/h (ref 0–30)

## 2019-10-10 LAB — HEPATITIS PANEL, ACUTE
Hep A IgM: NONREACTIVE
Hep B C IgM: NONREACTIVE
Hepatitis B Surface Ag: NONREACTIVE
Hepatitis C Ab: NONREACTIVE
SIGNAL TO CUT-OFF: 0 (ref <1.00)

## 2019-10-10 LAB — ANA: Anti Nuclear Antibody (ANA): POSITIVE — AB

## 2019-10-10 LAB — ANTI-NUCLEAR AB-TITER (ANA TITER)
ANA TITER: 1:80 {titer} — ABNORMAL HIGH
ANA Titer 1: 1:80 {titer} — ABNORMAL HIGH

## 2019-10-10 LAB — SJOGRENS SYNDROME-B EXTRACTABLE NUCLEAR ANTIBODY: SSB (La) (ENA) Antibody, IgG: <1 AI

## 2019-10-10 LAB — C3 AND C4
C3 Complement: 113 mg/dL (ref 83–193)
C4 Complement: 30 mg/dL (ref 15–57)

## 2019-10-10 LAB — RNP ANTIBODY: Ribonucleic Protein(ENA) Antibody, IgG: <1 AI

## 2019-10-10 LAB — IGG, IGA, IGM
IgG (Immunoglobin G), Serum: 520 mg/dL — ABNORMAL LOW (ref 600–1640)
IgM, Serum: 70 mg/dL (ref 50–300)
Immunoglobulin A: 161 mg/dL (ref 47–310)

## 2019-10-10 LAB — ANTI-SMITH ANTIBODY: ENA SM Ab Ser-aCnc: <1 AI

## 2019-10-10 LAB — SJOGRENS SYNDROME-A EXTRACTABLE NUCLEAR ANTIBODY: SSA (Ro) (ENA) Antibody, IgG: <1 AI

## 2019-10-10 LAB — HIV ANTIBODY (ROUTINE TESTING W REFLEX): HIV 1&2 Ab, 4th Generation: NONREACTIVE

## 2019-10-10 LAB — QUANTIFERON-TB GOLD PLUS
Mitogen-NIL: >10 IU/mL
NIL: 0.02 IU/mL
QuantiFERON-TB Gold Plus: NEGATIVE
TB1-NIL: 0.01 IU/mL
TB2-NIL: 0 IU/mL

## 2019-10-12 ENCOUNTER — Ambulatory Visit: Payer: 59 | Admitting: Physical Therapy

## 2019-10-12 NOTE — Progress Notes (Signed)
TB gold negative.  ANA positive-low nonspecific titer.  Labs do not reveal findings consistent with an autoimmune flare.

## 2019-10-13 ENCOUNTER — Ambulatory Visit: Payer: 59 | Admitting: Internal Medicine

## 2019-10-13 ENCOUNTER — Other Ambulatory Visit: Payer: Self-pay | Admitting: Rheumatology

## 2019-10-13 ENCOUNTER — Ambulatory Visit: Payer: 59 | Admitting: Physician Assistant

## 2019-10-13 ENCOUNTER — Telehealth: Payer: Self-pay | Admitting: Neurology

## 2019-10-13 NOTE — Telephone Encounter (Signed)
[name] The Lamictal is causing sever pain in my joints. I only took 2 days of the medication. Please advise what to do.  Thank you  [name]  Jun 16, 1966  Please call patient, reviewed her chart, she had a history of rheumatoid arthritis, psoriatic arthritis, that she truly think her joint pain related to lamotrigine or flareup of her arthritis, I reviewed literature, lamotrigine has small chance associated with joint pain, the most worrisome side effects are rash,  If she can, may encourage her to continue lamotrigine a little longer, if she wants to switch, Topamax, anti-epileptic medications was helpful for her migraines in the past, the other options are Depakote, Keppra, Zonegran.    Musculoskeletal Effects  Arthralgia Backache Myalgia Rhabdomyolysis Arthralgia 1) Incidence: immediate-release, 2% [15] 2) Arthralgia was reported in 2% of adult patients receiving lamotrigine (n=711) compared with none of those receiving placebo (n=419) in clinical trials of adjunctive epilepsy therapy [15].

## 2019-10-13 NOTE — Telephone Encounter (Signed)
States she had intense joint pain in wrists and hands that occurred after taking Lamictal 25mg  BID for two days.  She stopped the medication. The information provided by [name] below has been discussed with the patient.  She is willing to restart the Lamictal this weekend to see how is goes.  She will contact our office with any further concerns.

## 2019-10-13 NOTE — Telephone Encounter (Signed)
Last Visit: 10/07/19 Next Visit: 12/08/19 Labs: 08/12/19 Platelets 145, Glucose 124  Okay to refill per Dr. Estanislado Pandy

## 2019-10-14 ENCOUNTER — Ambulatory Visit: Payer: 59 | Admitting: Physical Therapy

## 2019-10-15 ENCOUNTER — Other Ambulatory Visit: Payer: Self-pay | Admitting: *Deleted

## 2019-10-15 DIAGNOSIS — R778 Other specified abnormalities of plasma proteins: Secondary | ICD-10-CM

## 2019-10-15 NOTE — Telephone Encounter (Signed)
Called [facility], rep Vaughan Basta advised that [place] # [id number] has been APPROVED for a grace period of 10/07/2019- 11/07/2019. Rep was not able to see why it was approved for a 1 month period. Rep sent a message to the clinical pharmacist who reviewed the authorization. Provided Amber Rph's office number for them to call with explanation.  Requested copy of auth to be faxed to office.  Billing code- [id number]  Phone# [phone]  3:37 PM Beatriz Chancellor, CPhT

## 2019-10-19 LAB — PROTEIN ELECTROPHORESIS,RANDOM URN
Creatinine, Urine: 18 mg/dL — ABNORMAL LOW (ref 20–275)
Total Protein, Urine: <4 mg/dL — ABNORMAL LOW (ref 5–24)

## 2019-10-19 NOTE — Telephone Encounter (Signed)
Patient received a grace period authorization to transition member to use home infusion, free-standing infusion center, or in office physician infusion.

## 2019-10-19 NOTE — Progress Notes (Signed)
Unremarkable.

## 2019-10-20 ENCOUNTER — Other Ambulatory Visit: Payer: Self-pay | Admitting: *Deleted

## 2019-10-20 DIAGNOSIS — M0609 Rheumatoid arthritis without rheumatoid factor, multiple sites: Secondary | ICD-10-CM

## 2019-10-20 NOTE — Telephone Encounter (Signed)
Infusion orders have been placed.  

## 2019-10-20 NOTE — Telephone Encounter (Signed)
Patient notified and that she will have to receive susequent  Infusions at an outpatient infusion center.  Reccommended [facility] but she would like to receive closer to [place]. Will look into if there are any outpatient infusion centers in [place].  Patient states her last Simponi injection was a month ago.  Informed patient we would send in her infusion orders and she may call [facility] to schedule her appointment at [phone] after lunch.  Patient verbalized understanding.  Mariella Saa, PharmD, [place], [facility] Clinical Specialty Pharmacist [phone]  10/20/2019 9:56 AM

## 2019-10-21 ENCOUNTER — Ambulatory Visit: Payer: 59 | Admitting: Physical Therapy

## 2019-10-23 ENCOUNTER — Other Ambulatory Visit: Payer: Self-pay | Admitting: Rheumatology

## 2019-10-23 NOTE — Telephone Encounter (Signed)
Last Visit: 10/07/19 Next Visit: 12/08/19 Labs: 08/12/19 Platelets 145, Glucose 124  Okay to refill per Dr. Estanislado Pandy

## 2019-10-26 ENCOUNTER — Encounter: Payer: 59 | Admitting: Physical Therapy

## 2019-10-27 ENCOUNTER — Encounter: Payer: Self-pay | Admitting: Physical Therapy

## 2019-10-27 DIAGNOSIS — R262 Difficulty in walking, not elsewhere classified: Secondary | ICD-10-CM

## 2019-10-27 DIAGNOSIS — M25652 Stiffness of left hip, not elsewhere classified: Secondary | ICD-10-CM

## 2019-10-27 DIAGNOSIS — M6281 Muscle weakness (generalized): Secondary | ICD-10-CM

## 2019-10-27 DIAGNOSIS — M25552 Pain in left hip: Secondary | ICD-10-CM

## 2019-10-27 NOTE — Therapy (Signed)
[facility] [address] [place], [address] Phone: [phone]   Fax:  [phone]  Physical Therapy No-Visit Discharge Reporting period: 08/19/2019 - 10/27/2019  Patient Details  Name: [name] MRN: [id number] Date of Birth: 08/12/1966 Referring Provider (PT): Gregor Hams, MD   Encounter Date: 10/27/2019    Past Medical History:  Diagnosis Date  . ACUT MI ANTEROLAT WALL SUBSQT EPIS CARE   . Acute maxillary sinusitis   . ALLERGIC RHINITIS, SEASONAL   . ARTHRITIS, RHEUMATOID   . Atrial fibrillation ([place])    a. after CABG.  . Back pain   . CAD, ARTERY BYPASS GRAFT    a. DES to RCA in 2010 then LAD occlusion s/p CABG 3 06/07/2009 with LIMA to LAD, reverse SVG to D1, reverse SVG to distal RCA. b. Cath 05/08/2016 slightly hypodense region in the intermediate branch, however she had excellent flow, FFR was normal. Vein graft to PDA and the posterolateral branch is patent, patent LIMA to LAD, occluded SVG to diagonal.  . CARPAL TUNNEL SYNDROME, BILATERAL   . CHOLELITHIASIS   . Contrast media allergy   . DERMATITIS, ALLERGIC   . DIABETES MELLITUS, TYPE I    on insulin pump  . DIABETIC  RETINOPATHY   . Hiatal hernia   . HYPERLIPIDEMIA-MIXED   . HYPERTHYROIDISM   . Insulin pump in place   . MIGRAINE W/O AURA W/INTRACT W/STATUS MIGRAINOSUS 02/19/2008  . SINUS TACHYCARDIA 11/08/2010  . TRIGGER FINGER   . URI     Past Surgical History:  Procedure Laterality Date  . ABDOMINAL HYSTERECTOMY    . Caesarean section    . CARDIAC CATHETERIZATION N/A 05/08/2016   Procedure: Left Heart Cath and Cors/Grafts Angiography;  Surgeon: Burnell Blanks, MD;  Location: [facility];  Service: Cardiovascular;  Laterality: N/A;  . CARPAL TUNNEL RELEASE    . CHOLECYSTECTOMY    . CORONARY ARTERY BYPASS GRAFT    . VITRECTOMY      There were no vitals filed for this visit.  Subjective Assessment - 10/27/19  1353    Subjective  Patient called and canceled future visits, reporting this is due to her feeling better and no longer requiring continued PT.    Pertinent History  [name] who presents to outpatient physical therapy with a referral for medical diagnosis left hip pain. This patient's chief complaints consist of left hip/thigh pain, weakness, stiffness, decreased function, and popping following fall off stool onto L hip while hanging curtains leading to the following functional deficits difficulty with ADLs, IADLs, work activities, housework, Haematologist, hobbies, sitting, standing, transferring, traveling, walking, running, bending, lifting, sleeping. Relevant past medical history and comorbidities include rheumatoid arthritis, DM type I (insulin pump with port at left abdomen), graves disease currently bothering her, steroid use; History of migraines, history of MI and open heart surgery (no symptoms from that or lingering problems, attributed to complications from DMI), diabetic retinopathy (currently doing well), osteopenia (last scan was good).    Limitations  Sitting;Walking;Lifting;House hold activities;Standing;Other (comment)    How long can you sit comfortably?  sitting is worse than standing, uncomfortable    How long can you stand comfortably?  unable to stand comfortably, is able to complete work duties with pain and difficulty    How long can you walk comfortably?  unable to walk comfortably, is able to complete work duties with pain and difficulty    Diagnostic  tests  L hip radiograph report 07/14/2019: "Impression: These findings are consistent with mild osteoarthritis of the left hip joint."    Patient Stated Goals  want not to hurt and be able to go through her daily life without any problems    Pain Onset  More than a month ago       OBJECTIVE Patient is not present for examination at this time. Please see previous documentation for latest objective data.     PT  Short Term Goals - 10/27/19 1355      PT SHORT TERM GOAL #1   Title  Be independent with initial home exercise program for self-management of symptoms.    Baseline  Initial HEP provided at IE (08/19/2019);    Time  2    Period  Weeks    Status  Achieved    Target Date  09/04/19        PT Long Term Goals - 10/27/19 1356      PT LONG TERM GOAL #1   Title  Be independent with a long-term home exercise program for self-management of symptoms.    Baseline  Initial HEP provided at IE (08/19/2019); Continue progress/update HEP (10/07/2019)    Time  6    Period  Weeks    Status  Achieved    Target Date  11/18/19      PT LONG TERM GOAL #2   Title  Demonstrate improved FOTO score to equal or greater than 63% to demonstrate improvement in overall condition and self-reported functional ability.    Baseline  FOTO = 47 (08/19/2019); 57 (10/07/2019)    Time  6    Period  Weeks    Status  Partially Met    Target Date  11/18/19      PT LONG TERM GOAL #3   Title  Have full left hip AROM with no compensations or increase in pain in all planes except intermittent end range discomfort to allow patient to complete valued activities with less difficulty.    Baseline  see objective exam (08/19/2019); see notes from (10/07/2019)    Time  6    Period  Weeks    Status  Partially Met    Target Date  11/18/19      PT LONG TERM GOAL #4   Title  Patient will demonstrate B LE strength to equal or greater than 4+/5 to demonstrate improved functional strength for improved mobility, increased standing tolerance, and improved ADL ability.    Baseline  see objective exam (08/19/2019); see notes from (10/07/2019)    Time  6    Period  Weeks    Status  Partially Met    Target Date  11/18/19      PT LONG TERM GOAL #5   Title  Complete community, work and/or recreational activities without limitation due to current condition.    Baseline  difficulty with ADLs, IADLs, work activities, housework, yardwork, hobbies,  sitting, standing, transferring, traveling, walking, running, bending, lifting, sleeping (08/19/2019); improving but painful (10/07/2019)    Time  6    Period  Weeks    Status  Partially Met    Target Date  11/18/19       Plan - 10/27/19 1359    Clinical Impression Statement  Patient has attended 8 skilled physical therapy treatment sessions this episode of care and overall made good progress towards stated goals. Subjectively, patient reported feeling pain relief at L hip and able to tolerate increased daily activities.  She requested discharge due to feeling confident in continuing her recovery independently and because she continues to feel better. At her last session patient demonstrated improvement of tolerance of daily activities(walking/eaching/car transfer/sit to stand); improved ROM (hip internal and external rotation); and improved strengthen(L hip flexion). Patient was provided with HEP appropriate for her level at time of last session and was educated throughout episode of care about self-care and self-management. Patient is now discharged from physical therapy due to improvement in her condition and confidence in self-management leading to self-discharge.    Personal Factors and Comorbidities  Comorbidity 3+;Profession;Time since onset of injury/illness/exacerbation;Age;Fitness    Comorbidities  rheumatoid arthritis, DM type I (insulin pump with port at left abdomen), graves disease currently bothering her, steroid use; History of migraines, history of MI and open heart surgery (no symptoms from that or lingering problems, attributed to complications from DMI), diabetic retinopathy (currently doing well), osteopenia (last scan was good).    Examination-Activity Limitations  Stairs;Bed Mobility;Dressing;Stand;Bend;Sit;Lift;Caring for Others;Transfers;Sleep;Squat;Carry;Other    Examination-Participation Restrictions  Other;Interpersonal Relationship;Yard Work;Cleaning;Laundry;Community  Activity;Shop    Rehab Potential  Fair    PT Frequency  2x / week    PT Duration  6 weeks    PT Treatment/Interventions  ADLs/Self Care Home Management;Aquatic Therapy;Electrical Stimulation;Moist Heat;Cryotherapy;Gait training;Stair training;Functional mobility training;Therapeutic activities;Therapeutic exercise;Balance training;Neuromuscular re-education;Patient/family education;Manual techniques;Passive range of motion;Dry needling;Taping;Spinal Manipulations;Joint Manipulations;Other (comment)    PT Next Visit Plan  Patient is now discharged from physical therapy due to improvement in her condition and confidence in self-management leading to self-discharge.    PT Home Exercise Plan  Medbridge Access Code: [email or web address]    Consulted and Agree with Plan of Care  Patient       Patient will benefit from skilled therapeutic intervention in order to improve the following deficits and impairments:  Abnormal gait, Decreased balance, Decreased endurance, Decreased mobility, Difficulty walking, Increased muscle spasms, Impaired perceived functional ability, Obesity, Pain, Impaired flexibility, Decreased strength, Decreased activity tolerance  Visit Diagnosis: Pain in left hip  Stiffness of left hip, not elsewhere classified  Muscle weakness (generalized)  Difficulty in walking, not elsewhere classified     Problem List Patient Active Problem List   Diagnosis Date Noted  . Chronic migraine 10/07/2019  . Contusion of left hip 07/25/2019  . H/O multiple allergies 03/10/2019  . Hx of anaphylaxis 03/10/2019  . Cough 06/26/2018  . PND (post-nasal drip) 06/26/2018  . Vitamin D deficiency 07/04/2017  . B12 deficiency 07/04/2017  . Abnormal CT scan 06/19/2017  . Gastroesophageal reflux disease 06/19/2017  . Antiplatelet or antithrombotic long-term use 06/19/2017  . Nasal congestion 03/15/2017  . Graves disease 02/01/2017  . Sleep difficulties 02/01/2017  . No energy 02/01/2017  . Osteopenia  02/01/2017  . Hypertension, essential 05/08/2016  . IDDM (insulin dependent diabetes mellitus) 05/08/2016  . Coronary artery disease involving native coronary artery of native heart with angina pectoris ([place])   . Hyperthyroidism 11/09/2010  . SINUS TACHYCARDIA 11/08/2010  . DERMATITIS, ALLERGIC 07/20/2010  . EDEMA 07/12/2010  . DIZZINESS 11/14/2009  . ATRIAL FIBRILLATION 07/20/2009  . CAD, ARTERY BYPASS GRAFT 06/07/2009  . ANGINA, STABLE/EXERTIONAL 06/02/2009  . HYPERLIPIDEMIA-MIXED 04/26/2009  . ACUT MI ANTEROLAT WALL SUBSQT EPIS CARE 04/26/2009  . Chest pain 04/26/2009  . DIABETIC  RETINOPATHY 04/25/2009  . CARPAL TUNNEL SYNDROME, BILATERAL 04/25/2009  . TRIGGER FINGER 04/25/2009  . MIGRAINE W/O AURA W/INTRACT W/STATUS MIGRAINOSUS 02/19/2008  . ALLERGIC RHINITIS, SEASONAL 10/16/2006  . CHOLELITHIASIS 10/16/2006  . Rheumatoid arthritis ([place]) 10/16/2006  Everlean Alstrom. Graylon Good, PT, DPT 10/27/19, 2:00 PM  [facility] [address], [place], [address] Phone: [phone]   Fax:  [phone]  Name: [name] MRN: [id number] Date of Birth: Dec 27, 1966

## 2019-10-27 NOTE — Telephone Encounter (Signed)
Left vm for patient if she receive the cambia rx from [facility] in [place].

## 2019-10-28 ENCOUNTER — Encounter: Payer: 59 | Admitting: Physical Therapy

## 2019-10-28 ENCOUNTER — Telehealth: Payer: Self-pay | Admitting: Pharmacist

## 2019-10-28 NOTE — Telephone Encounter (Signed)
Yes, we can go ahead and refer to [facility]

## 2019-10-28 NOTE — Telephone Encounter (Signed)
Received fax from [facility] for outpatient services for Actemra infusion.  She was approved for Actemra 1 mg injection from 10/07/2019-04/06/2020 using CPT Code J3262.  Her first infusion was approved for administration in hospital setting and is scheduled for tomorrow at [facility].  Mariella Saa, PharmD, [place], [place] Clinical Specialty Pharmacist [phone]  10/28/2019 11:16 AM

## 2019-10-29 ENCOUNTER — Other Ambulatory Visit: Payer: Self-pay

## 2019-10-29 ENCOUNTER — Ambulatory Visit (HOSPITAL_COMMUNITY)
Admission: RE | Admit: 2019-10-29 | Discharge: 2019-10-29 | Disposition: A | Discharge status: Home / Self Care | Payer: 59 | Source: Ambulatory Visit | Attending: Rheumatology | Admitting: Rheumatology

## 2019-10-29 ENCOUNTER — Ambulatory Visit: Payer: 59

## 2019-10-29 DIAGNOSIS — M0609 Rheumatoid arthritis without rheumatoid factor, multiple sites: Secondary | ICD-10-CM | POA: Insufficient documentation

## 2019-10-29 LAB — COMPREHENSIVE METABOLIC PANEL
ALT: 25 U/L (ref 0–44)
AST: 27 U/L (ref 15–41)
Albumin: 3.6 g/dL (ref 3.5–5.0)
Alkaline Phosphatase: 83 U/L (ref 38–126)
Anion gap: 3 — ABNORMAL LOW (ref 5–15)
BUN: 8 mg/dL (ref 6–20)
CO2: 26 mmol/L (ref 22–32)
Calcium: 8.8 mg/dL — ABNORMAL LOW (ref 8.9–10.3)
Chloride: 110 mmol/L (ref 98–111)
Creatinine, Ser: 0.86 mg/dL (ref 0.44–1.00)
GFR calc Af Amer: >60 mL/min (ref >60)
GFR calc non Af Amer: >60 mL/min (ref >60)
Glucose, Bld: 78 mg/dL (ref 70–99)
Potassium: 3.8 mmol/L (ref 3.5–5.1)
Sodium: 139 mmol/L (ref 135–145)
Total Bilirubin: 0.7 mg/dL (ref 0.3–1.2)
Total Protein: 6 g/dL — ABNORMAL LOW (ref 6.5–8.1)

## 2019-10-29 LAB — CBC
HCT: 37.9 % (ref 36.0–46.0)
Hemoglobin: 12.6 g/dL (ref 12.0–15.0)
MCH: 29.3 pg (ref 26.0–34.0)
MCHC: 33.2 g/dL (ref 30.0–36.0)
MCV: 88.1 fL (ref 80.0–100.0)
Platelets: 199 10*3/uL (ref 150–400)
RBC: 4.3 MIL/uL (ref 3.87–5.11)
RDW: 12 % (ref 11.5–15.5)
WBC: 5.2 10*3/uL (ref 4.0–10.5)
nRBC: 0 % (ref 0.0–0.2)

## 2019-10-29 MED ORDER — ACETAMINOPHEN 325 MG PO TABS
ORAL_TABLET | ORAL | Status: AC
  Filled 2019-10-29: qty 2

## 2019-10-29 MED ORDER — DIPHENHYDRAMINE HCL 25 MG PO CAPS
ORAL_CAPSULE | ORAL | Status: AC
  Filled 2019-10-29: qty 1

## 2019-10-29 MED ORDER — DIPHENHYDRAMINE HCL 25 MG PO CAPS
25.0000 mg | ORAL_CAPSULE | ORAL | Status: AC
  Administered 2019-10-29: 25 mg via ORAL

## 2019-10-29 MED ORDER — TOCILIZUMAB 400 MG/20ML IV SOLN
4.0000 mg/kg | Freq: Once | INTRAVENOUS | Status: AC
  Administered 2019-10-29: 322 mg via INTRAVENOUS
  Filled 2019-10-29: qty 16.1

## 2019-10-29 MED ORDER — ACETAMINOPHEN 325 MG PO TABS
650.0000 mg | ORAL_TABLET | ORAL | Status: AC
  Administered 2019-10-29: 09:00:00 650 mg via ORAL

## 2019-10-29 NOTE — Discharge Instructions (Signed)
Tocilizumab injection °What is this medicine? °TOCILIZUMAB (TOE si LIZ ue mab) is used to treat rheumatoid arthritis and juvenile idiopathic arthritis. It is also used to treat giant cell arteritis and cytokine release syndrome. °This medicine may be used for other purposes; ask your health care provider or pharmacist if you have questions. °COMMON BRAND NAME(S): Actemra °What should I tell my health care provider before I take this medicine? °They need to know if you have any of these conditions: °· cancer °· diabetes °· heart disease °· hepatitis B or history of hepatitis B infection °· high blood pressure °· high cholesterol °· immune system problems °· infection (especially a virus infection such as chickenpox, cold sores, or herpes) °· liver disease °· low blood counts, like low white cell, platelet, or red cell counts °· multiple sclerosis °· recently received or scheduled to receive a vaccination °· scheduled to have surgery °· stomach or intestine problems °· stroke °· tuberculosis, a positive skin test for tuberculosis, or have recently been in close contact with someone who has tuberculosis °· an unusual or allergic reaction to tocilizumab, other medicines, foods, dyes, or preservatives °· pregnant or trying to get pregnant °· breast-feeding °How should I use this medicine? °This medicine is for infusion into a vein or for injection under the skin. It is usually given by a health care professional in a hospital or clinic setting. If you get this medicine at home, you will be taught how to prepare and give this medicine. Use exactly as directed. Take your medicine at regular intervals. Do not take your medicine more often than directed. °It is important that you put your used needles and syringes in a special sharps container. Do not put them in a trash can. If you do not have a sharps container, call your pharmacist or healthcare provider to get one. °A special MedGuide will be given to you by the  pharmacist with each prescription and refill. Be sure to read this information carefully each time. °Talk to your pediatrician regarding the use of this medicine in children. While the drug may be prescribed for children as young as 2 years for selected conditions, precautions do apply. °Overdosage: If you think you have taken too much of this medicine contact a poison control center or emergency room at once. °NOTE: This medicine is only for you. Do not share this medicine with others. °What if I miss a dose? °It is important not to miss your dose. Call your doctor or health care professional if you are unable to keep an appointment. If you give yourself the medicine and you miss a dose, take it as soon as you can. If it is almost time for your next dose, take only that dose. Do not take double or extra doses. °What may interact with this medicine? °Do not take this medicine with any of the following medications: °· live virus vaccines °This medicine may also interact with the following medications: °· biologic medicines such as abatacept, adalimumab, anakinra, certolizumab, etanercept, golimumab, infliximab, rituximab, secukinumab, ustekinumab °· birth control pills °· certain medicines for cholesterol like atorvastatin, lovastatin, and simvastatin °· cyclosporine °· omeprazole °· steroid medicines like prednisone or cortisone °· theophylline °· vaccines °· warfarin °This list may not describe all possible interactions. Give your health care provider a list of all the medicines, herbs, non-prescription drugs, or dietary supplements you use. Also tell them if you smoke, drink alcohol, or use illegal drugs. Some items may interact with your medicine. °  What should I watch for while using this medicine? °Your condition will be monitored carefully while you are receiving this medicine. Tell your doctor or healthcare professional if your symptoms do not start to get better or if they get worse. You may need blood work  done while you are taking this medicine. °You will be tested for tuberculosis (TB) before you start this medicine. If your doctor prescribes any medicine for TB, you should start taking the TB medicine before starting this medicine. Make sure to finish the full course of TB medicine. °This medicine may increase your risk of getting an infection. Call your doctor or health care professional for advice if you get a fever, chills or sore throat, or other symptoms of a cold or flu. Do not treat yourself. Try to avoid being around people who are sick. °Talk to your doctor about your risk of cancer. You may be more at risk for certain types of cancers if you take this medicine. °What side effects may I notice from receiving this medicine? °Side effects that you should report to your doctor or health care professional as soon as possible: °· allergic reactions like skin rash, itching or hives, swelling of the face, lips, or tongue °· breathing problems °· changes in vision °· feeling faint or lightheaded, falls °· high blood pressure °· signs and symptoms of infection like fever or chills; cough; sore throat; pain or trouble passing urine °· signs and symptoms of liver injury like dark yellow or brown urine; general ill feeling or flu-like symptoms; light-colored stools; loss of appetite; nausea; right upper belly pain; unusually weak or tired; yellowing of the eyes or skin °· stomach pain °· tingling, numbness in the hands or feet °· unusual bleeding or bruising °· unusually weak or tired °Side effects that usually do not require medical attention (report to your doctor or health care professional if they continue or are bothersome): °· dizziness °· headache °· pain, redness, or irritation at site where injected °This list may not describe all possible side effects. Call your doctor for medical advice about side effects. You may report side effects to FDA at 1-800-FDA-1088. °Where should I keep my medicine? °Keep out of  the reach of children. °If you are using this medicine at home, you will be instructed on how to store this medicine. Throw away any unused medicine after the expiration date on the label. °NOTE: This sheet is a summary. It may not cover all possible information. If you have questions about this medicine, talk to your doctor, pharmacist, or health care provider. °© 2020 Elsevier/Gold Standard (2019-03-24 20:51:04) ° °

## 2019-10-30 NOTE — Telephone Encounter (Signed)
Called patient yesterday.  She had her first infusion with no issue.  Advised that she will have to go to an outpatient facility for her next infusion.  Asked patient if she had a preference for facility.  She has no preference.  Recommended [facility] and patient is agreeable.  New patient referral placed to [facility].  Will update when we hear a response.   Mariella Saa, PharmD, [place], [place] Clinical Specialty Pharmacist [phone]  10/30/2019 1:23 PM

## 2019-11-03 NOTE — Telephone Encounter (Signed)
Received fax from [facility] that referral was received.  No additional information requested.  They will send a fax when the patient is scheduled for her next infusion.   Mariella Saa, PharmD, [place], [place] Clinical Specialty Pharmacist [phone]  11/03/2019 1:32 PM

## 2019-11-11 ENCOUNTER — Ambulatory Visit (INDEPENDENT_AMBULATORY_CARE_PROVIDER_SITE_OTHER): Payer: 59

## 2019-11-11 ENCOUNTER — Other Ambulatory Visit: Payer: Self-pay

## 2019-11-11 DIAGNOSIS — E2839 Other primary ovarian failure: Secondary | ICD-10-CM | POA: Diagnosis not present

## 2019-11-11 DIAGNOSIS — Z1231 Encounter for screening mammogram for malignant neoplasm of breast: Secondary | ICD-10-CM

## 2019-11-11 DIAGNOSIS — M8589 Other specified disorders of bone density and structure, multiple sites: Secondary | ICD-10-CM | POA: Diagnosis not present

## 2019-11-12 NOTE — Telephone Encounter (Signed)
Called [facility] to check on patient's status. They have received approval through insurance and have been trying to reach patient to discuss benefits, process,  and copay assistance. They have been leaving messages.   Called patient, to advise. Patient states she does not think Actemra is working and said she is covered in Psoriasis. Offered to transfer patient to clinic to leave a message/ schedule appointment, patient declined and said she would call later today.  12:54 PM [name], CPhT

## 2019-11-12 NOTE — Progress Notes (Signed)
DEXA normal.  T-score -1.0 at left FN.  Repeat DEXA in 5 years.

## 2019-11-13 ENCOUNTER — Telehealth: Payer: Self-pay | Admitting: Rheumatology

## 2019-11-13 NOTE — Progress Notes (Signed)
Normal mammogram. Follow up 1 year.

## 2019-11-13 NOTE — Telephone Encounter (Signed)
Patient is scheduled for 11/17/2019 at 1:40pm.

## 2019-11-13 NOTE — Telephone Encounter (Signed)
Attempted to contact patient and left message on machine to advise patient to call the office.  Will schedule an appointment for evaluation.

## 2019-11-13 NOTE — Telephone Encounter (Signed)
Patient left a voicemail stating "her new medication is not working very well." Patient requested a return call

## 2019-11-15 ENCOUNTER — Other Ambulatory Visit: Payer: Self-pay

## 2019-11-15 ENCOUNTER — Ambulatory Visit
Admission: EM | Admit: 2019-11-15 | Discharge: 2019-11-15 | Disposition: A | Discharge status: Home / Self Care | Payer: 59 | Attending: Emergency Medicine | Admitting: Emergency Medicine

## 2019-11-15 ENCOUNTER — Encounter: Payer: Self-pay | Admitting: Emergency Medicine

## 2019-11-15 DIAGNOSIS — L989 Disorder of the skin and subcutaneous tissue, unspecified: Secondary | ICD-10-CM | POA: Diagnosis not present

## 2019-11-15 MED ORDER — MUPIROCIN 2 % EX OINT: 1.0000 "application " | TOPICAL_OINTMENT | Freq: Three times a day (TID) | CUTANEOUS | 0 refills | Status: DC

## 2019-11-15 NOTE — Discharge Instructions (Signed)
Apply the mupirocin ointment 3 times daily to prevent secondary infection.  Follow-up with dermatology next week.

## 2019-11-15 NOTE — ED Provider Notes (Signed)
[facility]    CSN: [id number] Arrival date & time: 11/15/19  1020      History   Chief Complaint Chief Complaint  Patient presents with  . Abscess    HPI [name] is a 53 y.o. female.   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  She states has been there for 2 weeks that she has noticed.  She has no fevers or chills.  She denies any injury or fall.  She states that she has been "picking at it".  Her children have told her that it looked infected.       Past Medical History:  Diagnosis Date  . ACUT MI ANTEROLAT WALL SUBSQT EPIS CARE   . Acute maxillary sinusitis   . ALLERGIC RHINITIS, SEASONAL   . ARTHRITIS, RHEUMATOID   . Atrial fibrillation ([place])    a. after CABG.  . Back pain   . CAD, ARTERY BYPASS GRAFT    a. DES to RCA in 2010 then LAD occlusion s/p CABG 3 06/07/2009 with LIMA to LAD, reverse SVG to D1, reverse SVG to distal RCA. b. Cath 05/08/2016 slightly hypodense region in the intermediate branch, however she had excellent flow, FFR was normal. Vein graft to PDA and the posterolateral branch is patent, patent LIMA to LAD, occluded SVG to diagonal.  . CARPAL TUNNEL SYNDROME, BILATERAL   . CHOLELITHIASIS   . Contrast media allergy   . DERMATITIS, ALLERGIC   . DIABETES MELLITUS, TYPE I    on insulin pump  . DIABETIC  RETINOPATHY   . Hiatal hernia   . HYPERLIPIDEMIA-MIXED   . HYPERTHYROIDISM   . Insulin pump in place   . MIGRAINE W/O AURA W/INTRACT W/STATUS MIGRAINOSUS 02/19/2008  . SINUS TACHYCARDIA 11/08/2010  . TRIGGER FINGER   . URI     Patient Active Problem List   Diagnosis Date Noted  . Chronic migraine 10/07/2019  . Contusion of left hip 07/25/2019  . H/O multiple allergies 03/10/2019  . Hx of anaphylaxis 03/10/2019  . Cough 06/26/2018  . PND (post-nasal drip) 06/26/2018  . Vitamin D deficiency 07/04/2017  . B12 deficiency 07/04/2017  . Abnormal CT scan 06/19/2017  .  Gastroesophageal reflux disease 06/19/2017  . Antiplatelet or antithrombotic long-term use 06/19/2017  . Nasal congestion 03/15/2017  . Graves disease 02/01/2017  . Sleep difficulties 02/01/2017  . No energy 02/01/2017  . Osteopenia 02/01/2017  . Hypertension, essential 05/08/2016  . IDDM (insulin dependent diabetes mellitus) 05/08/2016  . Coronary artery disease involving native coronary artery of native heart with angina pectoris ([place])   . Hyperthyroidism 11/09/2010  . SINUS TACHYCARDIA 11/08/2010  . DERMATITIS, ALLERGIC 07/20/2010  . EDEMA 07/12/2010  . DIZZINESS 11/14/2009  . ATRIAL FIBRILLATION 07/20/2009  . CAD, ARTERY BYPASS GRAFT 06/07/2009  . ANGINA, STABLE/EXERTIONAL 06/02/2009  . HYPERLIPIDEMIA-MIXED 04/26/2009  . ACUT MI ANTEROLAT WALL SUBSQT EPIS CARE 04/26/2009  . Chest pain 04/26/2009  . DIABETIC  RETINOPATHY 04/25/2009  . CARPAL TUNNEL SYNDROME, BILATERAL 04/25/2009  . TRIGGER FINGER 04/25/2009  . MIGRAINE W/O AURA W/INTRACT W/STATUS MIGRAINOSUS 02/19/2008  . ALLERGIC RHINITIS, SEASONAL 10/16/2006  . CHOLELITHIASIS 10/16/2006  . Rheumatoid arthritis ([place]) 10/16/2006    Past Surgical History:  Procedure Laterality Date  . ABDOMINAL HYSTERECTOMY    . Caesarean section    . CARDIAC CATHETERIZATION N/A 05/08/2016   Procedure: Left Heart Cath and Cors/Grafts Angiography;  Surgeon: Burnell Blanks, MD;  Location:  [facility];  Service: Cardiovascular;  Laterality: N/A;  . CARPAL TUNNEL RELEASE    . CHOLECYSTECTOMY    . CORONARY ARTERY BYPASS GRAFT    . VITRECTOMY      OB History   No obstetric history on file.      Home Medications    Prior to Admission medications   Medication Sig Start Date End Date Taking? Authorizing Provider  clopidogrel (PLAVIX) 75 MG tablet TAKE 1 TABLET BY MOUTH ONCE DAILY 05/15/19  Yes Sherren Mocha, MD  cyclobenzaprine (FLEXERIL) 10 MG tablet TAKE 1 TABLET BY MOUTH AT BEDTIME. 10/13/19  Yes Deveshwar, Abel Presto, MD   Diclofenac Potassium (CAMBIA) 50 MG PACK Take 50 mg by mouth as needed. 10/08/19  Yes Suzzanne Cloud, NP  Esomeprazole Magnesium (NEXIUM 24HR PO) Take by mouth daily.   Yes [provider]  folic acid (FOLVITE) 1 MG tablet Take 2 tablets (2 mg total) by mouth daily. 06/23/18  Yes Deveshwar, Abel Presto, MD  furosemide (LASIX) 40 MG tablet Take 40 mg by mouth daily as needed for edema (When she feels like she is swelling).   Yes [provider]  hydroxychloroquine (PLAQUENIL) 200 MG tablet TAKE 1 TABLET BY MOUTH TWICE DAILY. 08/10/19  Yes Deveshwar, Abel Presto, MD  insulin lispro (HUMALOG) 100 UNIT/ML injection FOR USE IN INSULIN PUMP. TOTAL DAILY INSULIN DOSE = UP TO 90 UNITS. 03/24/15  Yes [provider]  isosorbide mononitrate (IMDUR) 60 MG 24 hr tablet Take 1 tablet (60 mg total) by mouth daily. 03/04/19  Yes Sherren Mocha, MD  lamoTRIgine (LAMICTAL) 100 MG tablet Take 1 tablet (100 mg total) by mouth 2 (two) times daily. 10/07/19  Yes Marcial Pacas, MD  leflunomide (ARAVA) 20 MG tablet TAKE 1 TABLET BY MOUTH ONCE DAILY 10/13/19  Yes Deveshwar, Abel Presto, MD  losartan (COZAAR) 50 MG tablet TAKE 1 TABLET BY MOUTH ONCE DAILY 05/15/19  Yes Sherren Mocha, MD  methimazole (TAPAZOLE) 10 MG tablet Take by mouth. 08/10/19  Yes [provider]  metoprolol tartrate (LOPRESSOR) 50 MG tablet Take 1 tablet (50 mg total) by mouth 2 (two) times daily. 03/04/19 02/27/20 Yes Sherren Mocha, MD  montelukast (SINGULAIR) 10 MG tablet TAKE 1 TABLET BY MOUTH AT BEDTIME 08/11/19  Yes Gregor Hams, MD  nabumetone (RELAFEN) 750 MG tablet TAKE 1 TABLET BY MOUTH TWICE DAILY 10/23/19  Yes Deveshwar, Abel Presto, MD  Omega-3 Fatty Acids (FISH OIL) 1200 MG CAPS Take 1,200 mg by mouth 2 (two) times daily.   Yes [provider]  rosuvastatin (CRESTOR) 20 MG tablet Take 1 tablet (20 mg total) by mouth daily. 03/04/19 02/27/20 Yes Sherren Mocha, MD  EPINEPHrine 0.3 mg/0.3 mL IJ SOAJ injection Inject 0.3 mLs (0.3  mg total) into the muscle as needed for anaphylaxis. 03/09/19   Breeback, Royetta Car, PA-C  mupirocin ointment (BACTROBAN) 2 % Apply 1 application topically 3 (three) times daily. 11/15/19   Lorin Picket, PA-C  ONE TOUCH ULTRA TEST test strip  11/20/16   [provider]  potassium chloride (K-DUR) 10 MEQ tablet Take 1 tablet (10 mEq total) by mouth daily. Patient taking differently: 10 mEq as needed.  07/11/17   Sherren Mocha, MD    Family History Family History  Problem Relation Age of Onset  . Colon polyps Father   . Diabetes Other   . Heart disease Other   . Hypertension Other   . Hyperlipidemia Other   . Depression Other   . Migraines Other   .  Stroke Paternal Grandmother   . Heart attack Neg Hx   . Colon cancer Neg Hx   . Stomach cancer Neg Hx   . Esophageal cancer Neg Hx     Social History Social History   Tobacco Use  . Smoking status: Never Smoker  . Smokeless tobacco: Never Used  Substance Use Topics  . Alcohol use: Yes    Comment: rarely 1 every 6 months  . Drug use: No     Allergies   Prochlorperazine, Ramipril, Shellfish-derived products, Atorvastatin, Compazine  [prochlorperazine edisylate], Etanercept, Infliximab, Iohexol, Orencia [abatacept], Shellfish allergy, Tofacitinib, Emgality [galcanezumab-gnlm], Prochlorperazine edisylate, Trokendi xr [topiramate er], Amiodarone, and Rituximab   Review of Systems Review of Systems  Constitutional: Negative for activity change, appetite change, chills, fatigue and fever.  Skin: Positive for color change and wound.  All other systems reviewed and are negative.    Physical Exam Triage Vital Signs ED Triage Vitals  Enc Vitals Group     BP 11/15/19 1035 137/77     Pulse Rate 11/15/19 1035 73     Resp 11/15/19 1035 14     Temp 11/15/19 1035 98.2 F (36.8 C)     Temp Source 11/15/19 1035 Oral     SpO2 11/15/19 1035 99 %     Weight 11/15/19 1031 167 lb (75.8 kg)     Height 11/15/19 1031 5' 2.5"  (1.588 m)     Head Circumference --      Peak Flow --      Pain Score 11/15/19 1031 3     Pain Loc --      Pain Edu? --      Excl. in [place]? --    No data found.  Updated Vital Signs BP 137/77 (BP Location: Right Arm)   Pulse 73   Temp 98.2 F (36.8 C) (Oral)   Resp 14   Ht 5' 2.5" (1.588 m)   Wt 167 lb (75.8 kg)   SpO2 99%   BMI 30.06 kg/m   Visual Acuity Right Eye Distance:   Left Eye Distance:   Bilateral Distance:    Right Eye Near:   Left Eye Near:    Bilateral Near:     Physical Exam Vitals signs and nursing note reviewed.  Constitutional:      General: She is not in acute distress.    Appearance: Normal appearance. She is not ill-appearing, toxic-appearing or diaphoretic.  HENT:     Head: Normocephalic and atraumatic.  Eyes:     Conjunctiva/sclera: Conjunctivae normal.  Neck:     Musculoskeletal: Normal range of motion and neck supple.  Musculoskeletal: Normal range of motion.  Skin:    Findings: Lesion present.     Comments: Examination of the scalp over the occipital parietal area on the left shows a scaly irregularly bordered lesion measuring approximately 3 mm in diameter.  There is a mild amount of purulence expressed from a central core.  This was cultured and sent to the lab for analysis.  There does not peer to be any induration or fluctuance present.  Is mildly tender.  Neurological:     General: No focal deficit present.     Mental Status: She is alert and oriented to person, place, and time.  Psychiatric:        Mood and Affect: Mood normal.        Behavior: Behavior normal.        Thought Content: Thought content normal.  Judgment: Judgment normal.      UC Treatments / Results  Labs (all labs ordered are listed, but only abnormal results are displayed) Labs Reviewed  AEROBIC CULTURE (SUPERFICIAL SPECIMEN)    EKG   Radiology No results found.  Procedures Procedures (including critical care time)  Medications Ordered in UC      Medications - No data to display  Initial Impression / Assessment and Plan / UC Course  I have reviewed the triage vital signs and the nursing notes.  Pertinent labs & imaging results that were available during my care of the patient were reviewed by me and considered in my medical decision making (see chart for details).   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  Has noticed it for the last 2 weeks; has been picking at it.  Today it appears very scaly but does have purulent discharge which was expressed and sent for cultures.  I recommended that she follow-up with dermatology in the next week.  We will provide her with Bactroban ointment that she can use 3 times daily to prevent secondary infection.   Final Clinical Impressions(s) / UC Diagnoses   Final diagnoses:  Scalp lesion     Discharge Instructions     Apply the mupirocin ointment 3 times daily to prevent secondary infection.  Follow-up with dermatology next week.   ED Prescriptions    Medication Sig Dispense Auth. Provider   mupirocin ointment (BACTROBAN) 2 % Apply 1 application topically 3 (three) times daily. 22 g Lorin Picket, PA-C     PDMP not reviewed this encounter.   Lorin Picket, PA-C 11/15/19 1133

## 2019-11-15 NOTE — ED Triage Notes (Signed)
Patient c/o tender bump on her scalp that has been there for the past 2 weeks.  Patient denies fevers.  Patient denies injury or fall.

## 2019-11-16 NOTE — Progress Notes (Signed)
Office Visit Note  Patient: [name]             Date of Birth: 21-Feb-1966           MRN: [id number]             PCP: Lavada Mesi Referring: Donella Stade, PA-C Visit Date: 11/17/2019 Occupation: @GUAROCC @  Subjective:  Swelling in multiple joints and psoriasis flare.   History of Present Illness: [name] is a 53 y.o. female with history of seronegative rheumatoid arthritis.  She was switched recently from Simponi to Actemra to inadequate response.  She has had only 1 infusion of Actemra.  She states her psoriasis is very active currently and has involving her extremities.  She is also noticed a sore on her scalp and she is going to a dermatologist.  She continues to have swelling over her wrist joints and fingers.  Her shoulders continue to hurt.  She has chronic pain in her knee joints.  Activities of Daily Living:  Patient reports morning stiffness for 24 hours.   Patient Denies nocturnal pain.  Difficulty dressing/grooming: Denies Difficulty climbing stairs: Reports Difficulty getting out of chair: Denies Difficulty using hands for taps, buttons, cutlery, and/or writing: Reports  Review of Systems  Constitutional: Positive for fatigue. Negative for night sweats, weight gain and weight loss.  HENT: Negative for mouth sores, trouble swallowing, trouble swallowing, mouth dryness and nose dryness.   Eyes: Positive for dryness. Negative for pain, redness and visual disturbance.  Respiratory: Negative for cough, shortness of breath, wheezing and difficulty breathing.   Cardiovascular: Negative for chest pain, palpitations, hypertension, irregular heartbeat and swelling in legs/feet.  Gastrointestinal: Positive for diarrhea. Negative for blood in stool and constipation.  Endocrine: Negative for increased urination.  Genitourinary: Negative for difficulty urinating and vaginal dryness.  Musculoskeletal: Positive for arthralgias, joint pain, joint swelling and  morning stiffness. Negative for myalgias, muscle weakness, muscle tenderness and myalgias.  Skin: Positive for rash. Negative for color change, hair loss, skin tightness, ulcers and sensitivity to sunlight.  Allergic/Immunologic: Negative for susceptible to infections.  Neurological: Positive for headaches and weakness. Negative for dizziness, light-headedness, numbness, memory loss and night sweats.  Hematological: Negative for bruising/bleeding tendency and swollen glands.  Psychiatric/Behavioral: Negative for depressed mood, confusion and sleep disturbance. The patient is not nervous/anxious.     PMFS History:  Patient Active Problem List   Diagnosis Date Noted  . Chronic migraine 10/07/2019  . Contusion of left hip 07/25/2019  . H/O multiple allergies 03/10/2019  . Hx of anaphylaxis 03/10/2019  . Cough 06/26/2018  . PND (post-nasal drip) 06/26/2018  . Vitamin D deficiency 07/04/2017  . B12 deficiency 07/04/2017  . Abnormal CT scan 06/19/2017  . Gastroesophageal reflux disease 06/19/2017  . Antiplatelet or antithrombotic long-term use 06/19/2017  . Nasal congestion 03/15/2017  . Graves disease 02/01/2017  . Sleep difficulties 02/01/2017  . No energy 02/01/2017  . Osteopenia 02/01/2017  . Hypertension, essential 05/08/2016  . IDDM (insulin dependent diabetes mellitus) 05/08/2016  . Coronary artery disease involving native coronary artery of native heart with angina pectoris ([place])   . Hyperthyroidism 11/09/2010  . SINUS TACHYCARDIA 11/08/2010  . DERMATITIS, ALLERGIC 07/20/2010  . EDEMA 07/12/2010  . DIZZINESS 11/14/2009  . ATRIAL FIBRILLATION 07/20/2009  . CAD, ARTERY BYPASS GRAFT 06/07/2009  . ANGINA, STABLE/EXERTIONAL 06/02/2009  . HYPERLIPIDEMIA-MIXED 04/26/2009  . ACUT MI ANTEROLAT WALL SUBSQT EPIS CARE 04/26/2009  . Chest pain 04/26/2009  . DIABETIC  RETINOPATHY 04/25/2009  . CARPAL TUNNEL SYNDROME, BILATERAL 04/25/2009  . TRIGGER FINGER 04/25/2009  . MIGRAINE W/O  AURA W/INTRACT W/STATUS MIGRAINOSUS 02/19/2008  . ALLERGIC RHINITIS, SEASONAL 10/16/2006  . CHOLELITHIASIS 10/16/2006  . Rheumatoid arthritis ([place]) 10/16/2006    Past Medical History:  Diagnosis Date  . ACUT MI ANTEROLAT WALL SUBSQT EPIS CARE   . Acute maxillary sinusitis   . ALLERGIC RHINITIS, SEASONAL   . ARTHRITIS, RHEUMATOID   . Atrial fibrillation ([place])    a. after CABG.  . Back pain   . CAD, ARTERY BYPASS GRAFT    a. DES to RCA in 2010 then LAD occlusion s/p CABG 3 06/07/2009 with LIMA to LAD, reverse SVG to D1, reverse SVG to distal RCA. b. Cath 05/08/2016 slightly hypodense region in the intermediate branch, however she had excellent flow, FFR was normal. Vein graft to PDA and the posterolateral branch is patent, patent LIMA to LAD, occluded SVG to diagonal.  . CARPAL TUNNEL SYNDROME, BILATERAL   . CHOLELITHIASIS   . Contrast media allergy   . DERMATITIS, ALLERGIC   . DIABETES MELLITUS, TYPE I    on insulin pump  . DIABETIC  RETINOPATHY   . Hiatal hernia   . HYPERLIPIDEMIA-MIXED   . HYPERTHYROIDISM   . Insulin pump in place   . MIGRAINE W/O AURA W/INTRACT W/STATUS MIGRAINOSUS 02/19/2008  . SINUS TACHYCARDIA 11/08/2010  . TRIGGER FINGER   . URI     Family History  Problem Relation Age of Onset  . Colon polyps Father   . Diabetes Other   . Heart disease Other   . Hypertension Other   . Hyperlipidemia Other   . Depression Other   . Migraines Other   . Stroke Paternal Grandmother   . Heart attack Neg Hx   . Colon cancer Neg Hx   . Stomach cancer Neg Hx   . Esophageal cancer Neg Hx    Past Surgical History:  Procedure Laterality Date  . ABDOMINAL HYSTERECTOMY    . Caesarean section    . CARDIAC CATHETERIZATION N/A 05/08/2016   Procedure: Left Heart Cath and Cors/Grafts Angiography;  Surgeon: Burnell Blanks, MD;  Location: [facility];  Service: Cardiovascular;  Laterality: N/A;  . CARPAL TUNNEL RELEASE    . CHOLECYSTECTOMY    . CORONARY ARTERY BYPASS  GRAFT    . VITRECTOMY     Social History   Social History Narrative   Divorced. Has 2 kids(fraternal twins) daughters age 75. Works at [employer], Never smoked, denies ETOH, no drugs. Drinks diet coke. No exercise.       Immunization History  Administered Date(s) Administered  . Influenza, Quadrivalent, Recombinant, Inj, Pf 10/14/2013  . Influenza, Seasonal, Injecte, Preservative Fre 09/19/2016  . Influenza,inj,Quad PF,6+ Mos 09/30/2014, 09/03/2018  . Influenza,inj,quad, With Preservative 09/23/2017  . Influenza-Unspecified 10/13/2013, 10/04/2014, 09/30/2016  . Pneumococcal Conjugate-13 07/16/2016  . Pneumococcal Polysaccharide-23 01/01/2004, 10/15/2006  . Td 10/01/2003  . Tdap 01/09/2011, 06/25/2018     Objective: Vital Signs: BP 125/78 (BP Location: Left Arm, Patient Position: Sitting, Cuff Size: Normal)   Pulse 60   Resp 14   Ht 5' 2.5" (1.588 m)   Wt 168 lb (76.2 kg)   BMI 30.24 kg/m    Physical Exam Vitals signs and nursing note reviewed.  Constitutional:      Appearance: She is well-developed.  HENT:     Head: Normocephalic and atraumatic.  Eyes:     Conjunctiva/sclera: Conjunctivae normal.  Neck:  Musculoskeletal: Normal range of motion.  Cardiovascular:     Rate and Rhythm: Normal rate and regular rhythm.     Heart sounds: Normal heart sounds.  Pulmonary:     Effort: Pulmonary effort is normal.     Breath sounds: Normal breath sounds.  Abdominal:     General: Bowel sounds are normal.     Palpations: Abdomen is soft.  Lymphadenopathy:     Cervical: No cervical adenopathy.  Skin:    General: Skin is warm and dry.     Capillary Refill: Capillary refill takes less than 2 seconds.     Comments: Psoriasis patches on  extremities  Neurological:     Mental Status: She is alert and oriented to person, place, and time.  Psychiatric:        Behavior: Behavior normal.      Musculoskeletal Exam: Patient has limited lateral rotation of her  cervical spine.  She had painful range of motion of bilateral shoulder joints.  She has contracture in her bilateral elbows.  She has bilateral wrist flexor tenosynovitis.  She has synovitis of her left second MCP joint.  Hip joints and knee joints with full range of motion.  She has no tenderness or swelling over ankle joints or MTPs.  CDAI Exam: CDAI Score: 11  Patient Global: 5 mm; Provider Global: 5 mm Swollen: 3 ; Tender: 7  Joint Exam      Right  Left  Glenohumeral   Tender   Tender  Wrist  Swollen Tender  Swollen Tender  MCP 3     Swollen Tender  Knee   Tender   Tender     Investigation: No additional findings.  Imaging: Dg Bone Density (dxa)  Result Date: 11/11/2019 EXAM: DUAL X-RAY ABSORPTIOMETRY (DXA) FOR BONE MINERAL DENSITY IMPRESSION: Dear Ofilia Neas, Your patientJulie Ronnald [name] completed a BMD test on 11/11/2019 using the Lunar iDXA DXA system (analysis: version 16) manufactured by [employer]. The following summarizes the results of our evaluation. [facility] PATIENT: Name: Virginie, Rotar Patient ID: [id number] Birth Date: [birth year] Height: 63.0 in. Gender: Female Measured: 11/11/2019 Weight: 168.0 lbs. Indications: Caucasian, Diabectic, Estrogen Deficiency, Family History of Osteoporosis, History of Osteopenia, Hysterectomy, Low Calcium Intake, Oophorectomy (Bilateral), Postmenopausal Fractures: Treatments: Actemra, Arava, Insulin, Methimazole, Pacquonil, Prednisone, Relafen, Simpioni ASSESSMENT: The BMD measured at Femur Neck Left is 0.901 g/cm2 with a T-score of -1.0. This patient is considered NORMAL according to [facility]) criteria. Compared with the prior study on 02/08/2017 the BMD of the total mean hip shows no statiscally significant change. The scan quality is good. Site Region Measured Date Measured Age WHO YA BMD Classification T-score AP Spine L1-L4 11/11/2019 52.0 Normal -0.2 1.165 g/cm2 AP Spine L1-L4 02/08/2017 49.2 Normal -0.1 1.181 g/cm2  DualFemur Neck Left 11/11/2019 52.0 Normal -1.0 0.901 g/cm2 DualFemur Neck Left 02/08/2017 49.2 Normal -0.5 0.972 g/cm2 DualFemur Total Left 11/11/2019 52.0 Normal -1.0 0.883 g/cm2 DualFemur Total Left 02/08/2017 49.2 Normal -0.1 0.999 g/cm2 DualFemur Total Mean 11/11/2019 52.0 Normal -0.6 0.930 g/cm2 DualFemur Total Mean 02/08/2017 49.2 Normal -0.2 0.980 g/cm2 World Health Organization [facility]) criteria for post-menopausal, Caucasian Women: Normal       T-score at or above -1 SD Osteopenia   T-score between -1 and -2.5 SD Osteoporosis T-score at or below -2.5 SD RECOMMENDATION:1. All patients should optimize calcium and vitamin D intake. 2. Consider FDA-approved medical therapies in postmenopausal women and men age 71 years and older, based on the following: a. A hip  or vertebral(clinical or morphometric) fracture b. T-score < -2.5 at the femoral neck or spine after appropriate evaluation to exclude secondary causes c. Low bone mass (T-score between -1.0 and -2.5 at the femoral neck or spine) and a 10-year probability of a hip fracture > 3% or a 10-year probability of a major osteoporosis-related fracture > 20% based on the [place]-adapted WHO algorithm d. Clinician judgement and/or patient preferences may indicate treatment for people with 10-year fracture probabilities above or below these levels FOLLOW-UP: Patients with diagnosis of osteoporosis or at high risk for fracture should have regular bone mineral density tests. For patients eligible for Medicare, routine testing is allowed once every 2 years. The testing frequency can be increased to one year for patients who have rapidly progressing disease, those who are receiving or discontinuing medical therapy to restore bone mass, or have additional risk factors. I have reviewed this report, and agree with the above findings Mark A. Thornton Papas, M.D. [facility] Radiology Electronically Signed   By: Lavonia Dana M.D.   On: 11/11/2019 16:56   Mm 3d Screen Breast Bilateral   Result Date: 11/12/2019 CLINICAL DATA:  Screening. EXAM: DIGITAL SCREENING BILATERAL MAMMOGRAM WITH TOMO AND CAD COMPARISON:  Previous exam(s). ACR Breast Density Category b: There are scattered areas of fibroglandular density. FINDINGS: There are no findings suspicious for malignancy. Images were processed with CAD. IMPRESSION: No mammographic evidence of malignancy. A result letter of this screening mammogram will be mailed directly to the patient. RECOMMENDATION: Screening mammogram in one year. (Code:SM-B-01Y) BI-RADS CATEGORY  1: Negative. Electronically Signed   By: Audie Pinto M.D.   On: 11/12/2019 15:40    Recent Labs: Lab Results  Component Value Date   WBC 5.2 10/29/2019   HGB 12.6 10/29/2019   PLT 199 10/29/2019   NA 139 10/29/2019   K 3.8 10/29/2019   CL 110 10/29/2019   CO2 26 10/29/2019   GLUCOSE 78 10/29/2019   BUN 8 10/29/2019   CREATININE 0.86 10/29/2019   BILITOT 0.7 10/29/2019   ALKPHOS 83 10/29/2019   AST 27 10/29/2019   ALT 25 10/29/2019   PROT 6.0 (L) 10/29/2019   ALBUMIN 3.6 10/29/2019   CALCIUM 8.8 (L) 10/29/2019   GFRAA >60 10/29/2019   QFTBGOLDPLUS NEGATIVE 10/07/2019    Speciality Comments: PLQ Eye Exam: 01/06/19 WNL @ [facility]  Prior Therapy: methotrexate/Xeljanz/Actemra (GI upset), Enbrel (injection site reaction) and Orencia/Remicade/Humira/Cimzia/Rituxan (inadequate response).    Procedures:  No procedures performed Allergies: Prochlorperazine, Ramipril, Shellfish-derived products, Atorvastatin, Compazine  [prochlorperazine edisylate], Etanercept, Infliximab, Iohexol, Orencia [abatacept], Shellfish allergy, Tofacitinib, Emgality [galcanezumab-gnlm], Prochlorperazine edisylate, Trokendi xr [topiramate er], Amiodarone, and Rituximab   Assessment / Plan:     Visit Diagnoses: Rheumatoid arthritis of multiple sites with negative rheumatoid factor (HCC) - RF negative, anti-CCP negative, positive ANA. Enbrel, Humira,  Orencia, Xeljanz, Remicade, Simponi, actemra sq inj.  Patient continues to have joint pain and some swelling.  She has had 1 infusion of Actemra so far.  Patient states she was doing better on Simponi and would like to go back on Simponi.  She is also noticed a flare of psoriasis which she has not had in a long time.  She believes it happened after coming off Simponi.  She is interested in trying [employer].  Additional counseling was provided.  Side effects were discussed.  We will apply for Simponi Aria infusions.  High risk medication use -Switched from Simponi to Actemra.  She is on Arava 20  mg daily.  Hydroxychloroquine 200 mg p.o. twice daily first and only Actemra infusion on 10/29/2019.  Psoriasis worsened after infusion.  Prior therapy includes: methotrexate/Xeljanz/Actemra subq(GI upset), Enbrel (injection site reaction) andOrencia/Remicade/Humira/Cimzia/Rituxan (inadequate response).    Psoriasis-patient has extensive psoriasis today on her extremities.  She started developing psoriasis about 7 years ago.  She has had inflammatory arthritis for many years.  I wonder if she has a component of psoriatic arthritis instead of rheumatoid arthritis.  He also had detailed discussion regarding this today.  Her arthritis flared when she came off anti-TNF's.  Contracture of bilateral elbows-she has mild old contracture in her bilateral elbow joints.  Other fatigue-most likely due to chronic inflammation.  Other insomnia  Osteopenia of multiple sites-patient is on calcium and vitamin D.  History of vitamin D deficiency  Abnormal SPEP  Antiplatelet or antithrombotic long-term use-she is on Plavix.  History of gastroesophageal reflux (GERD)  History of migraine  History of Graves' disease  History of diabetes mellitus  History of coronary artery disease  History of hypertension  History of hyperlipidemia  Orders: No orders of the defined types were placed in this encounter.  No  orders of the defined types were placed in this encounter.    Follow-Up Instructions: Return for Rheumatoid arthritis.   Bo Merino, MD  Note - This record has been created using Editor, commissioning.  Chart creation errors have been sought, but may not always  have been located. Such creation errors do not reflect on  the standard of medical care.

## 2019-11-17 ENCOUNTER — Other Ambulatory Visit: Payer: Self-pay

## 2019-11-17 ENCOUNTER — Encounter: Payer: Self-pay | Admitting: Physician Assistant

## 2019-11-17 ENCOUNTER — Ambulatory Visit: Payer: 59 | Admitting: Rheumatology

## 2019-11-17 VITALS — BP 125/78 | HR 60 | Resp 14 | Ht 62.5 in | Wt 168.0 lb

## 2019-11-17 DIAGNOSIS — M8589 Other specified disorders of bone density and structure, multiple sites: Secondary | ICD-10-CM | POA: Diagnosis not present

## 2019-11-17 DIAGNOSIS — Z8719 Personal history of other diseases of the digestive system: Secondary | ICD-10-CM

## 2019-11-17 DIAGNOSIS — M0609 Rheumatoid arthritis without rheumatoid factor, multiple sites: Secondary | ICD-10-CM | POA: Diagnosis not present

## 2019-11-17 DIAGNOSIS — M24521 Contracture, right elbow: Secondary | ICD-10-CM | POA: Diagnosis not present

## 2019-11-17 DIAGNOSIS — Z8639 Personal history of other endocrine, nutritional and metabolic disease: Secondary | ICD-10-CM

## 2019-11-17 DIAGNOSIS — L409 Psoriasis, unspecified: Secondary | ICD-10-CM | POA: Diagnosis not present

## 2019-11-17 DIAGNOSIS — Z8679 Personal history of other diseases of the circulatory system: Secondary | ICD-10-CM

## 2019-11-17 DIAGNOSIS — R5383 Other fatigue: Secondary | ICD-10-CM | POA: Diagnosis not present

## 2019-11-17 DIAGNOSIS — Z7902 Long term (current) use of antithrombotics/antiplatelets: Secondary | ICD-10-CM

## 2019-11-17 DIAGNOSIS — G4709 Other insomnia: Secondary | ICD-10-CM

## 2019-11-17 DIAGNOSIS — Z79899 Other long term (current) drug therapy: Secondary | ICD-10-CM

## 2019-11-17 DIAGNOSIS — R778 Other specified abnormalities of plasma proteins: Secondary | ICD-10-CM

## 2019-11-17 DIAGNOSIS — M24522 Contracture, left elbow: Secondary | ICD-10-CM

## 2019-11-17 DIAGNOSIS — Z8669 Personal history of other diseases of the nervous system and sense organs: Secondary | ICD-10-CM

## 2019-11-18 ENCOUNTER — Other Ambulatory Visit: Payer: Self-pay | Admitting: Rheumatology

## 2019-11-18 LAB — CBC WITH DIFFERENTIAL/PLATELET
Absolute Monocytes: 513 cells/uL (ref 200–950)
Basophils Absolute: 38 cells/uL (ref 0–200)
Basophils Relative: 1 %
Eosinophils Absolute: 179 cells/uL (ref 15–500)
Eosinophils Relative: 4.7 %
HCT: 37.9 % (ref 35.0–45.0)
Hemoglobin: 13.1 g/dL (ref 11.7–15.5)
Lymphs Abs: 1938 cells/uL (ref 850–3900)
MCH: 29.1 pg (ref 27.0–33.0)
MCHC: 34.6 g/dL (ref 32.0–36.0)
MCV: 84.2 fL (ref 80.0–100.0)
MPV: 10.2 fL (ref 7.5–12.5)
Monocytes Relative: 13.5 %
Neutro Abs: 1132 cells/uL — ABNORMAL LOW (ref 1500–7800)
Neutrophils Relative %: 29.8 %
Platelets: 151 10*3/uL (ref 140–400)
RBC: 4.5 10*6/uL (ref 3.80–5.10)
RDW: 12.1 % (ref 11.0–15.0)
Total Lymphocyte: 51 %
WBC: 3.8 10*3/uL (ref 3.8–10.8)

## 2019-11-18 LAB — COMPLETE METABOLIC PANEL WITH GFR
AG Ratio: 2.6 (calc) — ABNORMAL HIGH (ref 1.0–2.5)
ALT: 21 U/L (ref 6–29)
AST: 24 U/L (ref 10–35)
Albumin: 4.2 g/dL (ref 3.6–5.1)
Alkaline phosphatase (APISO): 81 U/L (ref 37–153)
BUN: 11 mg/dL (ref 7–25)
CO2: 25 mmol/L (ref 20–32)
Calcium: 9.2 mg/dL (ref 8.6–10.4)
Chloride: 103 mmol/L (ref 98–110)
Creat: 0.98 mg/dL (ref 0.50–1.05)
GFR, Est African American: 77 mL/min/{1.73_m2} (ref >=60)
GFR, Est Non African American: 66 mL/min/{1.73_m2} (ref >=60)
Globulin: 1.6 g/dL (calc) — ABNORMAL LOW (ref 1.9–3.7)
Glucose, Bld: 174 mg/dL — ABNORMAL HIGH (ref 65–99)
Potassium: 4.4 mmol/L (ref 3.5–5.3)
Sodium: 139 mmol/L (ref 135–146)
Total Bilirubin: 0.6 mg/dL (ref 0.2–1.2)
Total Protein: 5.8 g/dL — ABNORMAL LOW (ref 6.1–8.1)

## 2019-11-18 LAB — AEROBIC CULTURE W GRAM STAIN (SUPERFICIAL SPECIMEN)

## 2019-11-18 NOTE — Telephone Encounter (Signed)
Last visit: 11/17/19 Next visit: 01/06/20 Labs: 11/17/19 Glu is high, rest of the labs are stable. PLQ Eye Exam: 01/06/19 WNL  Okay to refill per Dr. Estanislado Pandy

## 2019-11-18 NOTE — Progress Notes (Signed)
Glu is high, rest of the labs are stable.

## 2019-11-25 DIAGNOSIS — L928 Other granulomatous disorders of the skin and subcutaneous tissue: Secondary | ICD-10-CM | POA: Diagnosis not present

## 2019-11-25 DIAGNOSIS — D485 Neoplasm of uncertain behavior of skin: Secondary | ICD-10-CM | POA: Diagnosis not present

## 2019-11-25 DIAGNOSIS — L400 Psoriasis vulgaris: Secondary | ICD-10-CM | POA: Diagnosis not present

## 2019-11-25 DIAGNOSIS — M069 Rheumatoid arthritis, unspecified: Secondary | ICD-10-CM | POA: Diagnosis not present

## 2019-11-25 DIAGNOSIS — L859 Epidermal thickening, unspecified: Secondary | ICD-10-CM | POA: Diagnosis not present

## 2019-11-25 DIAGNOSIS — S00412A Abrasion of left ear, initial encounter: Secondary | ICD-10-CM | POA: Diagnosis not present

## 2019-11-30 ENCOUNTER — Other Ambulatory Visit: Payer: Self-pay | Admitting: Rheumatology

## 2019-11-30 NOTE — Telephone Encounter (Signed)
Notes recorded by Bo Merino, MD on 11/18/2019 at 8:06 AM EST  Glu is high, rest of the labs are stable.  Last RF 06/23/2018 Last appt 11/17/2019 Next appt 01/06/2020

## 2019-11-30 NOTE — Telephone Encounter (Signed)
Ok to refill 

## 2019-12-02 ENCOUNTER — Ambulatory Visit (INDEPENDENT_AMBULATORY_CARE_PROVIDER_SITE_OTHER): Payer: 59 | Admitting: Internal Medicine

## 2019-12-02 ENCOUNTER — Encounter: Payer: Self-pay | Admitting: Internal Medicine

## 2019-12-02 VITALS — Ht 62.5 in | Wt 168.0 lb

## 2019-12-02 DIAGNOSIS — R946 Abnormal results of thyroid function studies: Secondary | ICD-10-CM | POA: Diagnosis not present

## 2019-12-02 DIAGNOSIS — E103599 Type 1 diabetes mellitus with proliferative diabetic retinopathy without macular edema, unspecified eye: Secondary | ICD-10-CM

## 2019-12-02 DIAGNOSIS — M069 Rheumatoid arthritis, unspecified: Secondary | ICD-10-CM | POA: Diagnosis not present

## 2019-12-02 DIAGNOSIS — E0500 Thyrotoxicosis with diffuse goiter without thyrotoxic crisis or storm: Secondary | ICD-10-CM

## 2019-12-02 DIAGNOSIS — G43011 Migraine without aura, intractable, with status migrainosus: Secondary | ICD-10-CM | POA: Diagnosis not present

## 2019-12-02 DIAGNOSIS — E559 Vitamin D deficiency, unspecified: Secondary | ICD-10-CM | POA: Diagnosis not present

## 2019-12-02 DIAGNOSIS — F458 Other somatoform disorders: Secondary | ICD-10-CM | POA: Diagnosis not present

## 2019-12-02 DIAGNOSIS — L409 Psoriasis, unspecified: Secondary | ICD-10-CM

## 2019-12-02 DIAGNOSIS — I25700 Atherosclerosis of coronary artery bypass graft(s), unspecified, with unstable angina pectoris: Secondary | ICD-10-CM | POA: Diagnosis not present

## 2019-12-02 NOTE — Progress Notes (Signed)
Virtual Visit via Video Note  I connected with [name]   on 12/10/19 at  4:00 PM EST by a video enabled telemedicine application and verified that I am speaking with the correct person using two identifiers.  Location patient: home Location provider:work or home office Persons participating in the virtual visit: patient, provider  I discussed the limitations of evaluation and management by telemedicine and the availability of in person appointments. The patient expressed understanding and agreed to proceed.   HPI: New patient needs to establish with PCP   1.Migraine lamictal is not helping will f/u neurology [facility] 2. DM 1 and thyroid issues needs to f/u with [name] will do fasting labs here   Results for [name], [name] (MRN [id number]) as of 12/10/2019 09:07  Ref. Range 11/19/2018 16:54 08/12/2019 16:48  TSH Latest Ref Range: 0.350 - 4.500 uIU/mL 0.010 (L) 7.635 (H)  Triiodothyronine,Free,Serum Latest Ref Range: 2.0 - 4.4 pg/mL 9.1 (H) 2.7  T4,Free(Direct) Latest Ref Range: 0.61 - 1.12 ng/dL 3.84 (H) 0.46 (L)   On methimazole 5 mg qd  Needs to f/u endocrine   3. CAD s/p CABG due to f/u with [name] 02/2020  4. H/o rheumatoid arthritis and psoriasis failed several medications and needs to f/u with [name]   ROS: See pertinent positives and negatives per HPI. General: wt stable  HEENT: no sore throat  CV: no chest pain  Lungs: no sob GI: no ab pain  GU: no issues MSK+ shoulder and hand pain  Skin: +psoriasis  Neuro: +migraines  Psych: mood stable   Past Medical History:  Diagnosis Date  . ACUT MI ANTEROLAT WALL SUBSQT EPIS CARE   . Acute maxillary sinusitis   . ALLERGIC RHINITIS, SEASONAL   . ARTHRITIS, RHEUMATOID    shoulders and hands Enbrel>leg swelling [name]  . Atrial fibrillation ([name])    a. after CABG.  . Back pain   . Bruxism (teeth grinding)   . CAD, ARTERY BYPASS GRAFT    a. DES to RCA in 2010 then LAD occlusion s/p CABG 3  06/07/2009 with LIMA to LAD, reverse SVG to D1, reverse SVG to distal RCA. b. Cath 05/08/2016 slightly hypodense region in the intermediate branch, however she had excellent flow, FFR was normal. Vein graft to PDA and the posterolateral branch is patent, patent LIMA to LAD, occluded SVG to diagonal.  . CARPAL TUNNEL SYNDROME, BILATERAL   . CHOLELITHIASIS   . Contrast media allergy   . DERMATITIS, ALLERGIC   . DIABETES MELLITUS, TYPE I    on insulin pump dx'ed age 53 y.o   . DIABETIC  RETINOPATHY   . Hiatal hernia   . HYPERLIPIDEMIA-MIXED   . HYPERTHYROIDISM    [name]  . Insulin pump in place   . MIGRAINE W/O AURA W/INTRACT W/STATUS MIGRAINOSUS 02/19/2008  . Psoriasis   . SINUS TACHYCARDIA 11/08/2010  . SVT (supraventricular tachycardia) (HCC)    after s/p CABG  . TRIGGER FINGER    all fingers b/l hands   . URI     Past Surgical History:  Procedure Laterality Date  . ABDOMINAL HYSTERECTOMY     endometriomas b/l; total ? cervix removed; no h/o abnormal pap  . Caesarean section    . CARDIAC CATHETERIZATION N/A 05/08/2016   Procedure: Left Heart Cath and Cors/Grafts Angiography;  Surgeon: Burnell Blanks, MD;  Location: [facility];  Service: Cardiovascular;  Laterality: N/A;  . CARPAL TUNNEL RELEASE  b/l   . CATARACT EXTRACTION, BILATERAL    . CHOLECYSTECTOMY    . CORONARY ARTERY BYPASS GRAFT    . EYE SURGERY     laser x 2 for retinopathy   . TRIGGER FINGER RELEASE     b/l fingers all   . VITRECTOMY     b/l     Family History  Problem Relation Age of Onset  . Depression Mother   . Anxiety disorder Mother   . Colon polyps Father   . Diabetes Father        2  . Hypertension Father   . Parkinson's disease Father   . Diabetes Other   . Heart disease Other   . Hypertension Other   . Hyperlipidemia Other   . Depression Other   . Migraines Other   . Stroke Paternal Grandmother   . Heart attack Neg Hx   . Colon cancer Neg Hx   . Stomach cancer  Neg Hx   . Esophageal cancer Neg Hx     SOCIAL HX:   Divorced. Has 2 kids(fraternal twins) daughters age 28. Works at [employer], Never smoked, denies ETOH, no drugs. Drinks diet coke. No exercise.   DPR daughters [name] and [name] twins   Current Outpatient Medications:  .  clopidogrel (PLAVIX) 75 MG tablet, TAKE 1 TABLET BY MOUTH ONCE DAILY, Disp: 90 tablet, Rfl: 2 .  cyclobenzaprine (FLEXERIL) 10 MG tablet, TAKE 1 TABLET BY MOUTH AT BEDTIME., Disp: 30 tablet, Rfl: 0 .  Diclofenac Potassium (CAMBIA) 50 MG PACK, Take 50 mg by mouth as needed., Disp: 12 each, Rfl: 11 .  EPINEPHrine 0.3 mg/0.3 mL IJ SOAJ injection, Inject 0.3 mLs (0.3 mg total) into the muscle as needed for anaphylaxis., Disp: 1 Device, Rfl: prn .  Esomeprazole Magnesium (NEXIUM 24HR PO), Take 20 mg by mouth daily. , Disp: , Rfl:  .  folic acid (FOLVITE) 1 MG tablet, .AKE 2 TABLETS (2 MG TOTAL) BY MOUTH DAILY., Disp: 180 tablet, Rfl: 1 .  furosemide (LASIX) 40 MG tablet, Take 40 mg by mouth daily as needed for edema (When she feels like she is swelling)., Disp: , Rfl:  .  hydroxychloroquine (PLAQUENIL) 200 MG tablet, TAKE 1 TABLET BY MOUTH TWICE DAILY., Disp: 180 tablet, Rfl: 0 .  insulin lispro (HUMALOG) 100 UNIT/ML injection, FOR USE IN INSULIN PUMP. TOTAL DAILY INSULIN DOSE = UP TO 90 UNITS., Disp: , Rfl:  .  isosorbide mononitrate (IMDUR) 60 MG 24 hr tablet, Take 1 tablet (60 mg total) by mouth daily., Disp: 30 tablet, Rfl: 11 .  lamoTRIgine (LAMICTAL) 100 MG tablet, Take 1 tablet (100 mg total) by mouth 2 (two) times daily., Disp: 60 tablet, Rfl: 11 .  leflunomide (ARAVA) 20 MG tablet, TAKE 1 TABLET BY MOUTH ONCE DAILY, Disp: 90 tablet, Rfl: 0 .  losartan (COZAAR) 50 MG tablet, TAKE 1 TABLET BY MOUTH ONCE DAILY, Disp: 90 tablet, Rfl: 2 .  methimazole (TAPAZOLE) 5 MG tablet, Take by mouth. , Disp: , Rfl:  .  metoprolol tartrate (LOPRESSOR) 50 MG tablet, Take 1 tablet (50 mg total) by mouth 2 (two) times  daily., Disp: 180 tablet, Rfl: 3 .  montelukast (SINGULAIR) 10 MG tablet, TAKE 1 TABLET BY MOUTH AT BEDTIME, Disp: 30 tablet, Rfl: 2 .  mupirocin ointment (BACTROBAN) 2 %, Apply 1 application topically 3 (three) times daily., Disp: 22 g, Rfl: 0 .  nabumetone (RELAFEN) 750 MG tablet, TAKE 1 TABLET BY MOUTH TWICE DAILY, Disp:  60 tablet, Rfl: 0 .  Omega-3 Fatty Acids (FISH OIL) 1200 MG CAPS, Take 1,200 mg by mouth 2 (two) times daily., Disp: , Rfl:  .  ONE TOUCH ULTRA TEST test strip, , Disp: , Rfl: 1 .  potassium chloride (K-DUR) 10 MEQ tablet, Take 1 tablet (10 mEq total) by mouth daily. (Patient taking differently: 10 mEq daily as needed. ), Disp: 90 tablet, Rfl: 3 .  rosuvastatin (CRESTOR) 20 MG tablet, Take 1 tablet (20 mg total) by mouth daily., Disp: 90 tablet, Rfl: 3  EXAM:  VITALS per patient if applicable:  GENERAL: alert, oriented, appears well and in no acute distress  HEENT: atraumatic, conjunttiva clear, no obvious abnormalities on inspection of external nose and ears  NECK: normal movements of the head and neck  LUNGS: on inspection no signs of respiratory distress, breathing rate appears normal, no obvious gross SOB, gasping or wheezing  CV: no obvious cyanosis  MS: moves all visible extremities without noticeable abnormality  PSYCH/NEURO: pleasant and cooperative, no obvious depression or anxiety, speech and thought processing grossly intact  ASSESSMENT AND PLAN:  Discussed the following assessment and plan:  MIGRAINE W/O AURA W/INTRACT W/STATUS MIGRAINOSUS F/u [facility] will inform lamictal is not helping and disc other options at f/u 01/2020 Disc sleep study [facility] Disc teeth grinding with dentist   CAD/HTN F/u with Lisacopper referral sent for 02/2020   Rheumatoid arthritis, involving unspecified site, unspecified whether rheumatoid factor present ([place]) - Plan: Ambulatory referral to Rheumatology sent rheum message to sch appt if not sch sent referral  Psoriasis - Plan:  Ambulatory referral to Rheumatology  Graves disease - Plan: TSH, T4, free, T3, free, Thyroid peroxidase antibody Abnormal thyroid function test - Plan: TSH, T4, free, T3, free, Thyroid peroxidase antibody Type 1 diabetes mellitus without complication (HCC) - Plan: Lipid panel, HgB A1c -cont meds and f/u Dr. Steffanie Dunn [facility]  -pt to call and sch appt referral sent  See HPI lab results  Vitamin D deficiency - Plan: Vitamin D (25 hydroxy)  HM  Flu shot pna 23 due  Tdap utd Consider shingrix in future  mammo 11/11/19 negative  DEXA 11/11/19 normal  Colonoscopy 08/30/17 normal Dr. Silvio Pate  Pap consider in the future  SPEP abnormal consider nephrotic proteinuria consider renal in futuer   Endocrine [facility] Dr. Steffanie Dunn appt 02/19/20  Cards Dr. Gerrit Halls  [facility]-migraines  Dermatology Dr. Laurence Ferrari saw 12/2019 Rheumatology [name]  Hand surgery Dr. Fredna Dow  -we discussed possible serious and likely etiologies, options for evaluation and workup, limitations of telemedicine visit vs in person visit, treatment, treatment risks and precautions. Pt prefers to treat via telemedicine empirically rather then risking or undertaking an in person visit at this moment. Patient agrees to seek prompt in person care if worsening, new symptoms arise, or if is not improving with treatment.   I discussed the assessment and treatment plan with the patient. The patient was provided an opportunity to ask questions and all were answered. The patient agreed with the plan and demonstrated an understanding of the instructions.   The patient was advised to call back or seek an in-person evaluation if the symptoms worsen or if the condition fails to improve as anticipated.  Time spent 30 minutes  Delorise Jackson, MD

## 2019-12-03 NOTE — Progress Notes (Signed)
Patient scheduled both for fasting labs & pneumovax per Dr. Olivia Mackie.

## 2019-12-08 ENCOUNTER — Ambulatory Visit: Payer: 59 | Admitting: Physician Assistant

## 2019-12-10 ENCOUNTER — Other Ambulatory Visit: Payer: Self-pay | Admitting: Internal Medicine

## 2019-12-10 ENCOUNTER — Encounter: Payer: Self-pay | Admitting: Internal Medicine

## 2019-12-10 ENCOUNTER — Telehealth: Payer: Self-pay | Admitting: Neurology

## 2019-12-10 DIAGNOSIS — R946 Abnormal results of thyroid function studies: Secondary | ICD-10-CM | POA: Insufficient documentation

## 2019-12-10 DIAGNOSIS — E103599 Type 1 diabetes mellitus with proliferative diabetic retinopathy without macular edema, unspecified eye: Secondary | ICD-10-CM | POA: Insufficient documentation

## 2019-12-10 DIAGNOSIS — I2510 Atherosclerotic heart disease of native coronary artery without angina pectoris: Secondary | ICD-10-CM

## 2019-12-10 DIAGNOSIS — F458 Other somatoform disorders: Secondary | ICD-10-CM | POA: Insufficient documentation

## 2019-12-10 NOTE — Telephone Encounter (Signed)
I received a note from PCP about migraines and sleep study. Apparently migraines no better, wants a sleep study ordered. Please call the patient. She is scheduled for 01/13/2020 with me. She saw [name] in October. I can see her sooner if appointment is available if the patient wants, otherwise ok to wait until January.

## 2019-12-10 NOTE — Telephone Encounter (Signed)
I called the pt and LVM (ok per DPR) advising Sarah NP received a note from PCP about migraines and sleep study. Advised she currently has an appt for 01/13/20 @ 7:45 AM but if she would like to be seen sooner we are happy to change her appt. I provided office hours and number to call if she would like to be seen sooner.

## 2019-12-14 ENCOUNTER — Other Ambulatory Visit: Payer: Self-pay | Admitting: Family Medicine

## 2019-12-15 ENCOUNTER — Telehealth: Payer: Self-pay | Admitting: Rheumatology

## 2019-12-15 NOTE — Telephone Encounter (Signed)
Spoke with patient and she is scheduled for 12/22/2019.

## 2019-12-15 NOTE — Telephone Encounter (Signed)
Whitney from [facility] left a voicemail stating she has made 3 unsuccessful attempts to contact the patient to schedule her Simponi infusion.  Whitney states if Dr. Estanislado Pandy still wants the patient to schedule infusion, please call back at [phone] ext [phone].

## 2019-12-16 ENCOUNTER — Telehealth: Payer: Self-pay | Admitting: Pharmacist

## 2019-12-16 NOTE — Telephone Encounter (Signed)
Received fax from [facility] notifying our office that patient has been scheduled for Simponi infusion on 12/22/2019.   Mariella Saa, PharmD, [place], [place] Clinical Specialty Pharmacist [phone]  12/16/2019 9:32 AM

## 2019-12-17 DIAGNOSIS — L859 Epidermal thickening, unspecified: Secondary | ICD-10-CM | POA: Diagnosis not present

## 2019-12-17 DIAGNOSIS — L905 Scar conditions and fibrosis of skin: Secondary | ICD-10-CM | POA: Diagnosis not present

## 2019-12-17 DIAGNOSIS — L308 Other specified dermatitis: Secondary | ICD-10-CM | POA: Diagnosis not present

## 2019-12-17 DIAGNOSIS — L811 Chloasma: Secondary | ICD-10-CM | POA: Diagnosis not present

## 2019-12-22 ENCOUNTER — Other Ambulatory Visit: Payer: Self-pay | Admitting: Rheumatology

## 2019-12-22 ENCOUNTER — Ambulatory Visit: Payer: 59

## 2019-12-22 DIAGNOSIS — M069 Rheumatoid arthritis, unspecified: Secondary | ICD-10-CM | POA: Diagnosis not present

## 2019-12-22 NOTE — Telephone Encounter (Signed)
Received notification that patient received Simponi Aria infusion and tolerated well.  Mariella Saa, PharmD, [place], [place] Clinical Specialty Pharmacist [phone]  12/22/2019 3:51 PM

## 2019-12-22 NOTE — Telephone Encounter (Signed)
Last Visit: 11/17/2019 Next Visit: 01/06/2020 Labs: 11/17/2019 Glu is high, rest of the labs are stable.  Okay to refill per Dr. Estanislado Pandy.

## 2019-12-31 ENCOUNTER — Other Ambulatory Visit: Payer: Self-pay

## 2020-01-04 ENCOUNTER — Ambulatory Visit (INDEPENDENT_AMBULATORY_CARE_PROVIDER_SITE_OTHER): Payer: 59

## 2020-01-04 ENCOUNTER — Other Ambulatory Visit: Payer: Self-pay

## 2020-01-04 ENCOUNTER — Other Ambulatory Visit (INDEPENDENT_AMBULATORY_CARE_PROVIDER_SITE_OTHER): Payer: 59

## 2020-01-04 VITALS — Temp 96.1°F | Wt 164.0 lb

## 2020-01-04 DIAGNOSIS — R946 Abnormal results of thyroid function studies: Secondary | ICD-10-CM | POA: Diagnosis not present

## 2020-01-04 DIAGNOSIS — E103599 Type 1 diabetes mellitus with proliferative diabetic retinopathy without macular edema, unspecified eye: Secondary | ICD-10-CM

## 2020-01-04 DIAGNOSIS — E0500 Thyrotoxicosis with diffuse goiter without thyrotoxic crisis or storm: Secondary | ICD-10-CM | POA: Diagnosis not present

## 2020-01-04 DIAGNOSIS — Z23 Encounter for immunization: Secondary | ICD-10-CM | POA: Diagnosis not present

## 2020-01-04 DIAGNOSIS — E559 Vitamin D deficiency, unspecified: Secondary | ICD-10-CM

## 2020-01-04 LAB — VITAMIN D 25 HYDROXY (VIT D DEFICIENCY, FRACTURES): VITD: 26.29 ng/mL — ABNORMAL LOW (ref 30.00–100.00)

## 2020-01-04 LAB — T4, FREE: Free T4: 1.73 ng/dL — ABNORMAL HIGH (ref 0.60–1.60)

## 2020-01-04 LAB — T3, FREE: T3, Free: 3.2 pg/mL (ref 2.3–4.2)

## 2020-01-04 LAB — LIPID PANEL
Cholesterol: 166 mg/dL (ref 0–200)
HDL: 78.1 mg/dL (ref >39.00)
LDL Cholesterol: 58 mg/dL (ref 0–99)
NonHDL: 87.77
Total CHOL/HDL Ratio: 2
Triglycerides: 151 mg/dL — ABNORMAL HIGH (ref 0.0–149.0)
VLDL: 30.2 mg/dL (ref 0.0–40.0)

## 2020-01-04 LAB — HEMOGLOBIN A1C: Hgb A1c MFr Bld: 7 % — ABNORMAL HIGH (ref 4.6–6.5)

## 2020-01-04 LAB — TSH: TSH: <0.01 u[IU]/mL — ABNORMAL LOW (ref 0.35–4.50)

## 2020-01-04 NOTE — Progress Notes (Signed)
Office Visit Note  Patient: [name]             Date of Birth: 1966/12/02           MRN: [id number]             PCP: McLean-Scocuzza, Nino Glow, MD Referring: Donella Stade, PA-C Visit Date: 01/06/2020 Occupation: @GUAROCC @  Subjective:  Pain in multiple joints   History of Present Illness: LARK TORBECK is a 54 y.o. female with history of seronegative rheumatoid arthritis. She is on Simponi Aria IV infusions, Arava 20 mg 1 tablet daily, and plaquenil 200 mg 1 tablet BID.  Her last simponi infusion was on 12/22/19.  She discontinued actemra due to developing a flare of psoriasis.  Her psoriasis has started to clear since restarting on Simponi.  She had a PLQ eye exam performed today and according to the patient she has developed mild toxicity in the left eye and was advised to discontinue PLQ.  She continues to have pain in both hands and both knee joints.  She has intermittent inflammation in the right knee joint and both hands.  She recently got the 1st covid-19 vaccine.    Activities of Daily Living:  Patient reports morning stiffness for 1-1.5  hours.   Patient Reports nocturnal pain.  Difficulty dressing/grooming: Denies Difficulty climbing stairs: Reports Difficulty getting out of chair: Denies Difficulty using hands for taps, buttons, cutlery, and/or writing: Reports  Review of Systems  Constitutional: Positive for fatigue.  HENT: Negative for mouth sores, mouth dryness and nose dryness.   Eyes: Negative for pain, visual disturbance and dryness.  Respiratory: Negative for cough, hemoptysis, shortness of breath and difficulty breathing.   Cardiovascular: Negative for chest pain, palpitations, hypertension and swelling in legs/feet.  Gastrointestinal: Negative for blood in stool, constipation and diarrhea.  Endocrine: Negative for increased urination.  Genitourinary: Negative for painful urination.  Musculoskeletal: Positive for arthralgias, joint pain, joint swelling  and morning stiffness. Negative for myalgias, muscle weakness, muscle tenderness and myalgias.  Skin: Negative for color change, pallor, rash, hair loss, nodules/bumps, skin tightness, ulcers and sensitivity to sunlight.  Allergic/Immunologic: Negative for susceptible to infections.  Neurological: Positive for weakness. Negative for dizziness, numbness, headaches and memory loss.  Hematological: Negative for swollen glands.  Psychiatric/Behavioral: Positive for depressed mood. Negative for sleep disturbance. The patient is not nervous/anxious.     PMFS History:  Patient Active Problem List   Diagnosis Date Noted  . Type 1 diabetes mellitus with proliferative retinopathy ([place]) 12/10/2019  . Abnormal thyroid function test 12/10/2019  . Bruxism (teeth grinding)   . Chronic migraine 10/07/2019  . Contusion of left hip 07/25/2019  . H/O multiple allergies 03/10/2019  . Hx of anaphylaxis 03/10/2019  . Cough 06/26/2018  . PND (post-nasal drip) 06/26/2018  . Vitamin D deficiency 07/04/2017  . B12 deficiency 07/04/2017  . Abnormal CT scan 06/19/2017  . Gastroesophageal reflux disease 06/19/2017  . Antiplatelet or antithrombotic long-term use 06/19/2017  . Nasal congestion 03/15/2017  . Graves disease 02/01/2017  . Sleep difficulties 02/01/2017  . No energy 02/01/2017  . Osteopenia 02/01/2017  . Hypertension, essential 05/08/2016  . IDDM (insulin dependent diabetes mellitus) 05/08/2016  . Coronary artery disease involving native coronary artery of native heart with angina pectoris ([place])   . Hyperthyroidism 11/09/2010  . SINUS TACHYCARDIA 11/08/2010  . DERMATITIS, ALLERGIC 07/20/2010  . EDEMA 07/12/2010  . DIZZINESS 11/14/2009  . ATRIAL FIBRILLATION 07/20/2009  . CAD, ARTERY BYPASS GRAFT  06/07/2009  . ANGINA, STABLE/EXERTIONAL 06/02/2009  . HYPERLIPIDEMIA-MIXED 04/26/2009  . ACUT MI ANTEROLAT WALL SUBSQT EPIS CARE 04/26/2009  . Chest pain 04/26/2009  . DIABETIC  RETINOPATHY  04/25/2009  . CARPAL TUNNEL SYNDROME, BILATERAL 04/25/2009  . TRIGGER FINGER 04/25/2009  . MIGRAINE W/O AURA W/INTRACT W/STATUS MIGRAINOSUS 02/19/2008  . ALLERGIC RHINITIS, SEASONAL 10/16/2006  . CHOLELITHIASIS 10/16/2006  . Rheumatoid arthritis ([place]) 10/16/2006    Past Medical History:  Diagnosis Date  . ACUT MI ANTEROLAT WALL SUBSQT EPIS CARE   . Acute maxillary sinusitis   . ALLERGIC RHINITIS, SEASONAL   . ARTHRITIS, RHEUMATOID    shoulders and hands Enbrel>leg swelling Dr. Estanislado Pandy  . Atrial fibrillation ([place])    a. after CABG.  . Back pain   . Bruxism (teeth grinding)   . CAD, ARTERY BYPASS GRAFT    a. DES to RCA in 2010 then LAD occlusion s/p CABG 3 06/07/2009 with LIMA to LAD, reverse SVG to D1, reverse SVG to distal RCA. b. Cath 05/08/2016 slightly hypodense region in the intermediate branch, however she had excellent flow, FFR was normal. Vein graft to PDA and the posterolateral branch is patent, patent LIMA to LAD, occluded SVG to diagonal.  . CARPAL TUNNEL SYNDROME, BILATERAL   . CHOLELITHIASIS   . Contrast media allergy   . DERMATITIS, ALLERGIC   . DIABETES MELLITUS, TYPE I    on insulin pump dx'ed age 74 y.o   . DIABETIC  RETINOPATHY   . Hiatal hernia   . HYPERLIPIDEMIA-MIXED   . HYPERTHYROIDISM    Dr. Dollene Cleveland  . Insulin pump in place   . MIGRAINE W/O AURA W/INTRACT W/STATUS MIGRAINOSUS 02/19/2008  . Psoriasis   . SINUS TACHYCARDIA 11/08/2010  . SVT (supraventricular tachycardia) (H[facility])    after s/p CABG  . TRIGGER FINGER    all fingers b/l hands   . URI     Family History  Problem Relation Age of Onset  . Depression Mother   . Anxiety disorder Mother   . Colon polyps Father   . Diabetes Father        2  . Hypertension Father   . Parkinson's disease Father   . Diabetes Other   . Heart disease Other   . Hypertension Other   . Hyperlipidemia Other   . Depression Other   . Migraines Other   . Stroke Paternal Grandmother   . Heart attack Neg Hx     . Colon cancer Neg Hx   . Stomach cancer Neg Hx   . Esophageal cancer Neg Hx    Past Surgical History:  Procedure Laterality Date  . ABDOMINAL HYSTERECTOMY     endometriomas b/l; total ? cervix removed; no h/o abnormal pap  . Caesarean section    . CARDIAC CATHETERIZATION N/A 05/08/2016   Procedure: Left Heart Cath and Cors/Grafts Angiography;  Surgeon: Burnell Blanks, MD;  Location: [facility];  Service: Cardiovascular;  Laterality: N/A;  . CARPAL TUNNEL RELEASE     b/l   . CATARACT EXTRACTION, BILATERAL    . CHOLECYSTECTOMY    . CORONARY ARTERY BYPASS GRAFT    . EYE SURGERY     laser x 2 for retinopathy   . TRIGGER FINGER RELEASE     b/l fingers all   . VITRECTOMY     b/l    Social History   Social History Narrative   Divorced. Has 2 kids(fraternal twins) daughters age 98. Works at [employer], Never smoked,  denies ETOH, no drugs. Drinks diet coke. No exercise.       DPR daughters [name] and Paisleyann Dewitt twins          Immunization History  Administered Date(s) Administered  . Influenza, Quadrivalent, Recombinant, Inj, Pf 10/14/2013  . Influenza, Seasonal, Injecte, Preservative Fre 09/19/2016  . Influenza,inj,Quad PF,6+ Mos 09/30/2014, 09/03/2018  . Influenza,inj,quad, With Preservative 09/23/2017  . Influenza-Unspecified 10/04/2014, 09/30/2016, 09/10/2019  . Pneumococcal Conjugate-13 07/16/2016  . Pneumococcal Polysaccharide-23 01/01/2004, 10/15/2006, 01/04/2020  . Td 10/01/2003  . Tdap 01/09/2011, 06/25/2018     Objective: Vital Signs: BP (!) 148/77 (BP Location: Left Arm, Patient Position: Sitting, Cuff Size: Normal)   Pulse 74   Resp 13   Ht 5\' 3"  (1.6 m)   Wt 166 lb 9.6 oz (75.6 kg)   BMI 29.51 kg/m    Physical Exam Vitals and nursing note reviewed.  Constitutional:      Appearance: She is well-developed.  HENT:     Head: Normocephalic and atraumatic.  Eyes:     Conjunctiva/sclera: Conjunctivae normal.  Cardiovascular:      Rate and Rhythm: Normal rate and regular rhythm.     Heart sounds: Normal heart sounds.  Pulmonary:     Effort: Pulmonary effort is normal.     Breath sounds: Normal breath sounds.  Abdominal:     General: Bowel sounds are normal.     Palpations: Abdomen is soft.  Musculoskeletal:     Cervical back: Normal range of motion.  Lymphadenopathy:     Cervical: No cervical adenopathy.  Skin:    General: Skin is warm and dry.     Capillary Refill: Capillary refill takes less than 2 seconds.  Neurological:     Mental Status: She is alert and oriented to person, place, and time.  Psychiatric:        Behavior: Behavior normal.      Musculoskeletal Exam: C-spine limited ROM.  Thoracic and lumbar spine good ROM.  Shoulder joints limited abduction to 120 degrees bilaterally.  Mild elbow joint contractures bilaterally.  Wrist joints good ROM with some discomfort.  Tenderness of the left wrist joint.  She has tenderness and synovitis of bilateral 2nd and 3rd MCP joints.  Left hip slightly limited ROM.  Right hip good ROM.  Right knee warmth noted.  Tenderness of the left ankle joint.   CDAI Exam: CDAI Score: 11.2  Patient Global: 6 mm; Provider Global: 6 mm Swollen: 5 ; Tender: 6  Joint Exam 01/06/2020      Right  Left  Wrist      Tender  MCP 2  Swollen Tender  Swollen Tender  MCP 3  Swollen Tender  Swollen Tender  Knee  Swollen      Ankle      Tender     Investigation: No additional findings.  Imaging: No results found.  Recent Labs: Lab Results  Component Value Date   WBC 3.8 11/17/2019   HGB 13.1 11/17/2019   PLT 151 11/17/2019   NA 139 11/17/2019   K 4.4 11/17/2019   CL 103 11/17/2019   CO2 25 11/17/2019   GLUCOSE 174 (H) 11/17/2019   BUN 11 11/17/2019   CREATININE 0.98 11/17/2019   BILITOT 0.6 11/17/2019   ALKPHOS 83 10/29/2019   AST 24 11/17/2019   ALT 21 11/17/2019   PROT 5.8 (L) 11/17/2019   ALBUMIN 3.6 10/29/2019   CALCIUM 9.2 11/17/2019   GFRAA 77  11/17/2019   QFTBGOLDPLUS NEGATIVE 10/07/2019  Speciality Comments: PLQ Eye Exam: 01/06/2020 ABNORMAL @ [facility]. Patient advised to discontinue PLQ at office visit on 01/06/2020.  Prior Therapy: methotrexate/Xeljanz/Actemra (GI upset), Enbrel (injection site reaction) and Orencia/Remicade/Humira/Cimzia/Rituxan (inadequate response).  Procedures:  No procedures performed Allergies: Prochlorperazine, Ramipril, Shellfish-derived products, Atorvastatin, Compazine  [prochlorperazine edisylate], Etanercept, Infliximab, Iohexol, Orencia [abatacept], Shellfish allergy, Tofacitinib, Actemra [tocilizumab], Emgality [galcanezumab-gnlm], Prochlorperazine edisylate, Trokendi xr [topiramate er], Amiodarone, and Rituximab   CBC and CMP were drawn on 11/17/19.  TB gold negative on 10/07/19.   Assessment / Plan:     Visit Diagnoses: Rheumatoid arthritis of multiple sites with negative rheumatoid factor (HCC) - RF negative, anti-CCP negative, positive ANA: She has tenderness and synovitis of bilateral 2nd and 3rd MCP joints.  She has tenderness of the left wrist joint and left ankle joint and warmth of the right knee joint on exam.  She experienced a flare of psoriasis while on Actemra, so she requested to restart on Simponi Aria IV infusions.  She did not have any infusions in November and she restarted on Simponi Aria on 12/22/19.  She continues to take Arava 20 mg 1 tablet daily and plaquenil 200 mg 1 tablet BID.  She has been experiencing increased joint pain and stiffness due to having a gap in therapy.  She was evaluated by Dr. West Pugh today and was found to have early PLQ toxicity in the left eye and was advised to discontinue PLQ.  She previously had side effects or an inadequate response to Enbrel, Humira, Orencia, Xeljanz, Remicade, and actemra in the past.  She will continue on Simponi Aria IV infusions and take Arava 20 mg 1 tablet daily. She was advised to notify us if her joint  pain and inflammation persists after receiving her next Simponi infusion. She will follow up in 3 months.   High risk medication use - Switched from Simponi to Actemra.  She is on Arava 20 mg daily.  Hydroxychloroquine 200 mg p.o. twice daily.PLQ Eye Exam: 01/06/19  Psoriasis: She had a flare of psoriasis when she was switched from Simponi to Actemra.  She was restarted on Simponi Aria on 12/22/19.  Her psoriasis has started to clear.   Contracture of joint of both elbows - She has mild old contracture in her bilateral elbow joints.  Other fatigue: Chronic and related to insomnia.   Other insomnia: She experiences nocturnal pain.   Osteopenia of multiple sites: DEXA on 11/11/19: Left femur neck BMD 0.901 with T-score -1.0.  History of vitamin D deficiency  Other medical conditions are listed as follows:   Abnormal SPEP  History of hypertension  History of hyperlipidemia  History of Graves' disease  History of gastroesophageal reflux (GERD)  History of migraine  Antiplatelet or antithrombotic long-term use - she is on Plavix.  History of coronary artery disease  History of diabetes mellitus  Orders: No orders of the defined types were placed in this encounter.  Meds ordered this encounter  Medications  . leflunomide (ARAVA) 20 MG tablet    Sig: Take 1 tablet (20 mg total) by mouth daily.    Dispense:  90 tablet    Refill:  0    Face-to-face time spent with patient was 30 minutes. Greater than 50% of time was spent in counseling and coordination of care.  Follow-Up Instructions: Return in about 3 months (around 04/05/2020) for Rheumatoid arthritis.  Hazel Sams, PA-C  I examined and evaluated the patient with Hazel Sams PA.  Patient continues  to have ongoing synovitis.  Her psoriasis has cleared on Simpon Aria.  She will discontinue Plaquenil due to abnormal eye examination.  We will monitor and see the response to the [place].  The plan of care was discussed as  noted above.  Bo Merino, MD  Note - This record has been created using Editor, commissioning.  Chart creation errors have been sought, but may not always  have been located. Such creation errors do not reflect on  the standard of medical care.

## 2020-01-05 LAB — THYROID PEROXIDASE ANTIBODY: Thyroperoxidase Ab SerPl-aCnc: 284 IU/mL — ABNORMAL HIGH (ref <9)

## 2020-01-06 ENCOUNTER — Encounter (INDEPENDENT_AMBULATORY_CARE_PROVIDER_SITE_OTHER): Payer: 59 | Admitting: Ophthalmology

## 2020-01-06 ENCOUNTER — Encounter: Payer: Self-pay | Admitting: Lab

## 2020-01-06 ENCOUNTER — Encounter: Payer: Self-pay | Admitting: Rheumatology

## 2020-01-06 ENCOUNTER — Other Ambulatory Visit: Payer: Self-pay

## 2020-01-06 ENCOUNTER — Ambulatory Visit: Payer: 59 | Admitting: Rheumatology

## 2020-01-06 VITALS — BP 148/77 | HR 74 | Resp 13 | Ht 63.0 in | Wt 166.6 lb

## 2020-01-06 DIAGNOSIS — E103593 Type 1 diabetes mellitus with proliferative diabetic retinopathy without macular edema, bilateral: Secondary | ICD-10-CM | POA: Diagnosis not present

## 2020-01-06 DIAGNOSIS — Z8639 Personal history of other endocrine, nutritional and metabolic disease: Secondary | ICD-10-CM

## 2020-01-06 DIAGNOSIS — H35033 Hypertensive retinopathy, bilateral: Secondary | ICD-10-CM

## 2020-01-06 DIAGNOSIS — L409 Psoriasis, unspecified: Secondary | ICD-10-CM

## 2020-01-06 DIAGNOSIS — R5383 Other fatigue: Secondary | ICD-10-CM | POA: Diagnosis not present

## 2020-01-06 DIAGNOSIS — M24522 Contracture, left elbow: Secondary | ICD-10-CM

## 2020-01-06 DIAGNOSIS — Z8679 Personal history of other diseases of the circulatory system: Secondary | ICD-10-CM

## 2020-01-06 DIAGNOSIS — G4709 Other insomnia: Secondary | ICD-10-CM | POA: Diagnosis not present

## 2020-01-06 DIAGNOSIS — M0609 Rheumatoid arthritis without rheumatoid factor, multiple sites: Secondary | ICD-10-CM | POA: Diagnosis not present

## 2020-01-06 DIAGNOSIS — E10319 Type 1 diabetes mellitus with unspecified diabetic retinopathy without macular edema: Secondary | ICD-10-CM | POA: Diagnosis not present

## 2020-01-06 DIAGNOSIS — M069 Rheumatoid arthritis, unspecified: Secondary | ICD-10-CM

## 2020-01-06 DIAGNOSIS — I10 Essential (primary) hypertension: Secondary | ICD-10-CM

## 2020-01-06 DIAGNOSIS — Z7902 Long term (current) use of antithrombotics/antiplatelets: Secondary | ICD-10-CM

## 2020-01-06 DIAGNOSIS — Z8669 Personal history of other diseases of the nervous system and sense organs: Secondary | ICD-10-CM

## 2020-01-06 DIAGNOSIS — M24521 Contracture, right elbow: Secondary | ICD-10-CM | POA: Diagnosis not present

## 2020-01-06 DIAGNOSIS — M8589 Other specified disorders of bone density and structure, multiple sites: Secondary | ICD-10-CM | POA: Diagnosis not present

## 2020-01-06 DIAGNOSIS — R778 Other specified abnormalities of plasma proteins: Secondary | ICD-10-CM | POA: Diagnosis not present

## 2020-01-06 DIAGNOSIS — Z79899 Other long term (current) drug therapy: Secondary | ICD-10-CM | POA: Diagnosis not present

## 2020-01-06 DIAGNOSIS — Z8719 Personal history of other diseases of the digestive system: Secondary | ICD-10-CM

## 2020-01-06 LAB — HM DIABETES EYE EXAM

## 2020-01-06 MED ORDER — LEFLUNOMIDE 20 MG PO TABS: 20.0000 mg | ORAL_TABLET | Freq: Every day | ORAL | 0 refills | Status: DC

## 2020-01-07 MED FILL — methIMAzole 10 MG TABS: 10 | 30 days supply | Qty: 45 | Fill #0

## 2020-01-12 NOTE — Progress Notes (Signed)
PATIENT: [name] DOB: 1966/09/22  REASON FOR VISIT: follow up HISTORY FROM: patient  HISTORY OF PRESENT ILLNESS: Today 01/13/20  HISTORY  [name] is a 54 year old female, seen in request by her primary care PA [name] for evaluation of chronic migraine headache, initial evaluation was on October 07, 2019.  I have reviewed and summarized the referring note from the referring physician.  She had past medical history of insulin-dependent diabetes, hypertension, hyperlipidemia, coronary artery disease, rheumatoid arthritis, psoriatic arthritis, on polypharmacy treatment  She reported a history of migraine headaches since teenager, her typical migraine bilateral retro-orbital area vertex area severe pounding headache with associated light noise sensitivity, nauseous, lasting for couple hours, recently she been having typical migraine once a week, but in between, she also has similar but milder degree of headaches, she often take ibuprofen as needed for migraine, not a candidate for triptan because of her chronic artery disease.  Previously tried Topamax as preventive medications, she complains of side effect of mental slowing, Topamax was helpful.  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  She denies bilateral feet paresthesia,  Update January 13, 2020 SS: When last seen by [name], was started on lamotrigine, titrating to 100 mg twice a day, Cambia as needed.  She complained of joint pain with the medication, also has history of rheumatoid arthritis, psoriatic arthritis.  She decided to stay on Lamictal.  She has previously tried [employer] (had reaction of hives), Topamax, but had side effect of mental slowing.  She has not found Lamictal to be beneficial, reduce the dose to 50 mg twice a day, reports side effect of dizziness, stumbling, cannot find her words.  She says she is having 5 headaches a week, mild to moderate, only took Cambia once, was not  helpful.  She usually does not take any medication, does not miss work as result of headache.  With headache reports phonophobia, some nausea.  Her primary doctor mention the idea of sleep study, but infrequent snoring, no daytime drowsiness, patient does not think needed at this time.  REVIEW OF SYSTEMS: Out of a complete 14 system review of symptoms, the patient complains only of the following symptoms, and all other reviewed systems are negative.  Headache, dizziness, memory loss, weakness  ALLERGIES: Allergies  Allergen Reactions  . Prochlorperazine Rash    Neuro problems per pt Neurological reaction  . Ramipril Swelling, Rash and Other (See Comments)  . Shellfish-Derived Products Swelling    Shrimp(Facial swelling) Shrimp(Facial swelling)  . Atorvastatin Rash    Elevated LFT's Elevated LFT's Elevated LFT's  . Compazine  [Prochlorperazine Edisylate] Other (See Comments)    Neurological reaction  . Etanercept Swelling and Rash  . Infliximab Rash  . Iohexol     Iv contrast dye -rash all over  . Orencia [Abatacept] Rash  . Shellfish Allergy Swelling    Shrimp(Facial swelling)  . Tofacitinib Rash and Other (See Comments)    Severe abdominal pain Severe abdominal pain Severe abdominal pain  . Actemra [Tocilizumab]   . Emgality [Galcanezumab-Gnlm] Hives and Swelling  . Prochlorperazine Edisylate     unknown  . Trokendi [employer]]     [employer], memory issues, word finding issues  . Amiodarone Nausea Only  . Rituximab Rash    Causes a rash    HOME MEDICATIONS: Outpatient Medications Prior to Visit  Medication Sig Dispense Refill  . clopidogrel (PLAVIX) 75 MG tablet TAKE 1 TABLET BY MOUTH  ONCE DAILY 90 tablet 2  . cyclobenzaprine (FLEXERIL) 10 MG tablet TAKE 1 TABLET BY MOUTH AT BEDTIME. 30 tablet 0  . Diclofenac Potassium (CAMBIA) 50 MG PACK Take 50 mg by mouth as needed. 12 each 11  . EPINEPHrine 0.3 mg/0.3 mL IJ SOAJ injection Inject 0.3 mLs (0.3 mg total)  into the muscle as needed for anaphylaxis. 1 Device prn  . Esomeprazole Magnesium (NEXIUM 24HR PO) Take 20 mg by mouth daily.     . folic acid (FOLVITE) 1 MG tablet .AKE 2 TABLETS (2 MG TOTAL) BY MOUTH DAILY. 180 tablet 1  . furosemide (LASIX) 40 MG tablet Take 40 mg by mouth daily as needed for edema (When she feels like she is swelling).    . Golimumab ([place] ARIA IV) Inject into the vein.    Marland Kitchen insulin lispro (HUMALOG) 100 UNIT/ML injection FOR USE IN INSULIN PUMP. TOTAL DAILY INSULIN DOSE = UP TO 90 UNITS.    Marland Kitchen isosorbide mononitrate (IMDUR) 60 MG 24 hr tablet Take 1 tablet (60 mg total) by mouth daily. 30 tablet 11  . leflunomide (ARAVA) 20 MG tablet Take 1 tablet (20 mg total) by mouth daily. 90 tablet 0  . losartan (COZAAR) 50 MG tablet TAKE 1 TABLET BY MOUTH ONCE DAILY 90 tablet 2  . methimazole (TAPAZOLE) 5 MG tablet Take by mouth.     . metoprolol tartrate (LOPRESSOR) 50 MG tablet Take 1 tablet (50 mg total) by mouth 2 (two) times daily. 180 tablet 3  . montelukast (SINGULAIR) 10 MG tablet TAKE 1 TABLET BY MOUTH AT BEDTIME 30 tablet 2  . mupirocin ointment (BACTROBAN) 2 % Apply 1 application topically 3 (three) times daily. 22 g 0  . nabumetone (RELAFEN) 750 MG tablet TAKE 1 TABLET BY MOUTH TWICE DAILY 60 tablet 0  . Omega-3 Fatty Acids (FISH OIL) 1200 MG CAPS Take 1,200 mg by mouth 2 (two) times daily.    . ONE TOUCH ULTRA TEST test strip   1  . potassium chloride (K-DUR) 10 MEQ tablet Take 1 tablet (10 mEq total) by mouth daily. (Patient taking differently: 10 mEq daily as needed. ) 90 tablet 3  . rosuvastatin (CRESTOR) 20 MG tablet Take 1 tablet (20 mg total) by mouth daily. 90 tablet 3  . lamoTRIgine (LAMICTAL) 100 MG tablet Take 1 tablet (100 mg total) by mouth 2 (two) times daily. (Patient taking differently: Take 50 mg by mouth 2 (two) times daily. ) 60 tablet 11   No facility-administered medications prior to visit.    PAST MEDICAL HISTORY: Past Medical History:  Diagnosis  Date  . ACUT MI ANTEROLAT WALL SUBSQT EPIS CARE   . Acute maxillary sinusitis   . ALLERGIC RHINITIS, SEASONAL   . ARTHRITIS, RHEUMATOID    shoulders and hands Enbrel>leg swelling Dr. Estanislado Pandy  . Atrial fibrillation ([place])    a. after CABG.  . Back pain   . Bruxism (teeth grinding)   . CAD, ARTERY BYPASS GRAFT    a. DES to RCA in 2010 then LAD occlusion s/p CABG 3 06/07/2009 with LIMA to LAD, reverse SVG to D1, reverse SVG to distal RCA. b. Cath 05/08/2016 slightly hypodense region in the intermediate branch, however she had excellent flow, FFR was normal. Vein graft to PDA and the posterolateral branch is patent, patent LIMA to LAD, occluded SVG to diagonal.  . CARPAL TUNNEL SYNDROME, BILATERAL   . CHOLELITHIASIS   . Contrast media allergy   . DERMATITIS, ALLERGIC   . DIABETES  MELLITUS, TYPE I    on insulin pump dx'ed age 24 y.o   . DIABETIC  RETINOPATHY   . Hiatal hernia   . HYPERLIPIDEMIA-MIXED   . HYPERTHYROIDISM    Dr. Dollene Cleveland  . Insulin pump in place   . MIGRAINE W/O AURA W/INTRACT W/STATUS MIGRAINOSUS 02/19/2008  . Psoriasis   . SINUS TACHYCARDIA 11/08/2010  . SVT (supraventricular tachycardia) (HCC)    after s/p CABG  . TRIGGER FINGER    all fingers b/l hands   . URI     PAST SURGICAL HISTORY: Past Surgical History:  Procedure Laterality Date  . ABDOMINAL HYSTERECTOMY     endometriomas b/l; total ? cervix removed; no h/o abnormal pap  . Caesarean section    . CARDIAC CATHETERIZATION N/A 05/08/2016   Procedure: Left Heart Cath and Cors/Grafts Angiography;  Surgeon: Burnell Blanks, MD;  Location: [facility];  Service: Cardiovascular;  Laterality: N/A;  . CARPAL TUNNEL RELEASE     b/l   . CATARACT EXTRACTION, BILATERAL    . CHOLECYSTECTOMY    . CORONARY ARTERY BYPASS GRAFT    . EYE SURGERY     laser x 2 for retinopathy   . TRIGGER FINGER RELEASE     b/l fingers all   . VITRECTOMY     b/l     FAMILY HISTORY: Family History  Problem Relation  Age of Onset  . Depression Mother   . Anxiety disorder Mother   . Colon polyps Father   . Diabetes Father        2  . Hypertension Father   . Parkinson's disease Father   . Diabetes Other   . Heart disease Other   . Hypertension Other   . Hyperlipidemia Other   . Depression Other   . Migraines Other   . Stroke Paternal Grandmother   . Heart attack Neg Hx   . Colon cancer Neg Hx   . Stomach cancer Neg Hx   . Esophageal cancer Neg Hx     SOCIAL HISTORY: Social History   Socioeconomic History  . Marital status: Divorced    Spouse name: Not on file  . Number of children: Not on file  . Years of education: Not on file  . Highest education level: Not on file  Occupational History  . Not on file  Tobacco Use  . Smoking status: Never Smoker  . Smokeless tobacco: Never Used  Substance and Sexual Activity  . Alcohol use: Yes    Comment: rarely 1 every 6 months  . Drug use: No  . Sexual activity: Not Currently    Partners: Male    Birth control/protection: None  Other Topics Concern  . Not on file  Social History Narrative   Divorced. Has 2 kids(fraternal twins) daughters age 82. Works at [employer], Never smoked, denies ETOH, no drugs. Drinks diet coke. No exercise.       DPR daughters [name] and Simone Hindi twins          Social Determinants of Health   Financial Resource Strain:   . Difficulty of Paying Living Expenses: Not on file  Food Insecurity:   . Worried About Charity fundraiser in the Last Year: Not on file  . Ran Out of Food in the Last Year: Not on file  Transportation Needs:   . Lack of Transportation (Medical): Not on file  . Lack of Transportation (Non-Medical): Not on file  Physical Activity:   .  Days of Exercise per Week: Not on file  . Minutes of Exercise per Session: Not on file  Stress:   . Feeling of Stress : Not on file  Social Connections:   . Frequency of Communication with Friends and Family: Not on file  . Frequency of  Social Gatherings with Friends and Family: Not on file  . Attends Religious Services: Not on file  . Active Member of Clubs or Organizations: Not on file  . Attends Archivist Meetings: Not on file  . Marital Status: Not on file  Intimate Partner Violence:   . Fear of Current or Ex-Partner: Not on file  . Emotionally Abused: Not on file  . Physically Abused: Not on file  . Sexually Abused: Not on file   PHYSICAL EXAM  Vitals:   01/13/20 0728  BP: 119/66  Pulse: (!) 56  Temp: (!) 96 F (35.6 C)  TempSrc: Oral  Weight: 166 lb 6.4 oz (75.5 kg)  Height: 5\' 3"  (1.6 m)   Body mass index is 29.48 kg/m.  Generalized: Well developed, in no acute distress   Neurological examination  Mentation: Alert oriented to time, place, history taking. Follows all commands speech and language fluent Cranial nerve II-XII: Pupils were equal round reactive to light. Extraocular movements were full, visual field were full on confrontational test. Facial sensation and strength were normal.  Head turning and shoulder shrug  were normal and symmetric. Motor: The motor testing reveals 5 over 5 strength of all 4 extremities. Good symmetric motor tone is noted throughout.  Sensory: Sensory testing is intact to soft touch on all 4 extremities. No evidence of extinction is noted.  Coordination: Cerebellar testing reveals good finger-nose-finger and heel-to-shin bilaterally.  Gait and station: Gait is normal. Tandem gait is normal. Romberg is negative. No drift is seen.  Reflexes: Deep tendon reflexes are symmetric and normal bilaterally.   DIAGNOSTIC DATA (LABS, IMAGING, TESTING) - I reviewed patient records, labs, notes, testing and imaging myself where available.  Lab Results  Component Value Date   WBC 3.8 11/17/2019   HGB 13.1 11/17/2019   HCT 37.9 11/17/2019   MCV 84.2 11/17/2019   PLT 151 11/17/2019      Component Value Date/Time   NA 139 11/17/2019 1404   NA 142 07/16/2016 0000   K  4.4 11/17/2019 1404   CL 103 11/17/2019 1404   CO2 25 11/17/2019 1404   GLUCOSE 174 (H) 11/17/2019 1404   BUN 11 11/17/2019 1404   BUN 7 07/16/2016 0000   CREATININE 0.98 11/17/2019 1404   CALCIUM 9.2 11/17/2019 1404   PROT 5.8 (L) 11/17/2019 1404   ALBUMIN 3.6 10/29/2019 0918   AST 24 11/17/2019 1404   ALT 21 11/17/2019 1404   ALKPHOS 83 10/29/2019 0918   BILITOT 0.6 11/17/2019 1404   GFRNONAA 66 11/17/2019 1404   GFRAA 77 11/17/2019 1404   Lab Results  Component Value Date   CHOL 166 01/04/2020   HDL 78.10 01/04/2020   LDLCALC 58 01/04/2020   TRIG 151.0 (H) 01/04/2020   CHOLHDL 2 01/04/2020   Lab Results  Component Value Date   HGBA1C 7.0 (H) 01/04/2020   Lab Results  Component Value Date   VITAMINB12 1,222 (H) 08/12/2019   Lab Results  Component Value Date   TSH <0.01 Repeated and verified X2. (L) 01/04/2020      ASSESSMENT AND PLAN 54 y.o. year old female  has a past medical history of ACUT MI ANTEROLAT WALL SUBSQT  EPIS CARE, Acute maxillary sinusitis, ALLERGIC RHINITIS, SEASONAL, ARTHRITIS, RHEUMATOID, Atrial fibrillation (HCC), Back pain, Bruxism (teeth grinding), CAD, ARTERY BYPASS GRAFT, CARPAL TUNNEL SYNDROME, BILATERAL, CHOLELITHIASIS, Contrast media allergy, DERMATITIS, ALLERGIC, DIABETES MELLITUS, TYPE I, DIABETIC  RETINOPATHY, Hiatal hernia, HYPERLIPIDEMIA-MIXED, HYPERTHYROIDISM, Insulin pump in place, MIGRAINE W/O AURA W/INTRACT W/STATUS MIGRAINOSUS (02/19/2008), Psoriasis, SINUS TACHYCARDIA (11/08/2010), SVT (supraventricular tachycardia) ([place]), TRIGGER FINGER, and URI. here with:  1.  Chronic migraine headache -Reports on average 5 headaches a week, mild to moderate, unable to tolerate Lamictal due to side effect of dizziness, stumbling, cannot find her words -Previously has tried [employer] (had hives, local reaction), Topamax, Trokendi, amitriptyline, is now on metoprolol, Topamax was helpful -Start Effexor XR 37.5 mg at bedtime for headache prevention,  stop Lamictal due to side effect and lack of benefit -Continue Cambia as needed, only tried once, recommend trying again if needed -In the future, the dose may be increased of Effexor, or could possibly consider another CGRP, retry of Trokendi, or Zonegran, or even Botox therapy -She will follow-up in 4 months or sooner if needed   I spent 15 minutes with the patient. 50% of this time was spent discussing her plan of care.   Butler Denmark, AGNP-C, DNP 01/13/2020, 8:17 AM [facility] [address] [place], [place] [address] [phone]

## 2020-01-13 ENCOUNTER — Encounter: Payer: Self-pay | Admitting: Neurology

## 2020-01-13 ENCOUNTER — Other Ambulatory Visit: Payer: Self-pay | Admitting: Rheumatology

## 2020-01-13 ENCOUNTER — Ambulatory Visit: Payer: 59 | Admitting: Neurology

## 2020-01-13 ENCOUNTER — Other Ambulatory Visit: Payer: Self-pay

## 2020-01-13 VITALS — BP 119/66 | HR 56 | Temp 96.0°F | Ht 63.0 in | Wt 166.4 lb

## 2020-01-13 DIAGNOSIS — G43709 Chronic migraine without aura, not intractable, without status migrainosus: Secondary | ICD-10-CM

## 2020-01-13 DIAGNOSIS — IMO0002 Reserved for concepts with insufficient information to code with codable children: Secondary | ICD-10-CM

## 2020-01-13 MED ORDER — VENLAFAXINE HCL ER 37.5 MG PO CP24: 37.5000 mg | ORAL_CAPSULE | Freq: Every day | ORAL | 5 refills | Status: DC

## 2020-01-13 NOTE — Telephone Encounter (Signed)
Last Visit: 01/06/20 Next Visit: 04/08/20  Okay to refill per Dr. Estanislado Pandy

## 2020-01-13 NOTE — Patient Instructions (Signed)
Stop Lamictal, due to not helping your headaches   Start effexor-XR 37.5 mg at bedtime  Venlafaxine tablets What is this medicine? VENLAFAXINE (VEN la fax een) is used to treat depression, anxiety and panic disorder. This medicine may be used for other purposes; ask your health care provider or pharmacist if you have questions. COMMON BRAND NAME(S): Effexor What should I tell my health care provider before I take this medicine? They need to know if you have any of these conditions:  bleeding disorders  glaucoma  heart disease  high blood pressure  high cholesterol  kidney disease  liver disease  low levels of sodium in the blood  mania or bipolar disorder  seizures  suicidal thoughts, plans, or attempt; a previous suicide attempt by you or a family  take medicines that treat or prevent blood clots  thyroid disease  an unusual or allergic reaction to venlafaxine, desvenlafaxine, other medicines, foods, dyes, or preservatives  pregnant or trying to get pregnant  breast-feeding How should I use this medicine? Take this medicine by mouth with a glass of water. Follow the directions on the prescription label. Take it with food. Take your medicine at regular intervals. Do not take your medicine more often than directed. Do not stop taking this medicine suddenly except upon the advice of your doctor. Stopping this medicine too quickly may cause serious side effects or your condition may worsen. A special MedGuide will be given to you by the pharmacist with each prescription and refill. Be sure to read this information carefully each time. Talk to your pediatrician regarding the use of this medicine in children. Special care may be needed. Overdosage: If you think you have taken too much of this medicine contact a poison control center or emergency room at once. NOTE: This medicine is only for you. Do not share this medicine with others. What if I miss a dose? If you miss a  dose, take it as soon as you can. If it is almost time for your next dose, take only that dose. Do not take double or extra doses. What may interact with this medicine? Do not take this medicine with any of the following medications:  certain medicines for fungal infections like fluconazole, itraconazole, ketoconazole, posaconazole, voriconazole  cisapride  desvenlafaxine  dronedarone  duloxetine  levomilnacipran  linezolid  MAOIs like Carbex, Eldepryl, Marplan, Nardil, and Parnate  methylene blue (injected into a vein)  milnacipran  pimozide  thioridazine This medicine may also interact with the following medications:  amphetamines  aspirin and aspirin-like medicines  certain medicines for depression, anxiety, or psychotic disturbances  certain medicines for migraine headaches like almotriptan, eletriptan, frovatriptan, naratriptan, rizatriptan, sumatriptan, zolmitriptan  certain medicines for sleep  certain medicines that treat or prevent blood clots like dalteparin, enoxaparin, warfarin  cimetidine  clozapine  diuretics  fentanyl  furazolidone  indinavir  isoniazid  lithium  metoprolol  NSAIDS, medicines for pain and inflammation, like ibuprofen or naproxen  other medicines that prolong the QT interval (cause an abnormal heart rhythm) like dofetilide, ziprasidone  procarbazine  rasagiline  supplements like St. John's wort, kava kava, valerian  tramadol  tryptophan This list may not describe all possible interactions. Give your health care provider a list of all the medicines, herbs, non-prescription drugs, or dietary supplements you use. Also tell them if you smoke, drink alcohol, or use illegal drugs. Some items may interact with your medicine. What should I watch for while using this medicine? Tell your doctor if  your symptoms do not get better or if they get worse. Visit your doctor or health care professional for regular checks on your  progress. Because it may take several weeks to see the full effects of this medicine, it is important to continue your treatment as prescribed by your doctor. Patients and their families should watch out for new or worsening thoughts of suicide or depression. Also watch out for sudden changes in feelings such as feeling anxious, agitated, panicky, irritable, hostile, aggressive, impulsive, severely restless, overly excited and hyperactive, or not being able to sleep. If this happens, especially at the beginning of treatment or after a change in dose, call your health care professional. This medicine can cause an increase in blood pressure. Check with your doctor for instructions on monitoring your blood pressure while taking this medicine. You may get drowsy or dizzy. Do not drive, use machinery, or do anything that needs mental alertness until you know how this medicine affects you. Do not stand or sit up quickly, especially if you are an older patient. This reduces the risk of dizzy or fainting spells. Alcohol may interfere with the effect of this medicine. Avoid alcoholic drinks. Your mouth may get dry. Chewing sugarless gum, sucking hard candy and drinking plenty of water will help. Contact your doctor if the problem does not go away or is severe. What side effects may I notice from receiving this medicine? Side effects that you should report to your doctor or health care professional as soon as possible:  allergic reactions like skin rash, itching or hives, swelling of the face, lips, or tongue  anxious  breathing problems  confusion  changes in vision  chest pain  confusion  elevated mood, decreased need for sleep, racing thoughts, impulsive behavior  eye pain  fast, irregular heartbeat  feeling faint or lightheaded, falls  feeling agitated, angry, or irritable  hallucination, loss of contact with reality  high blood pressure  loss of balance or  coordination  palpitations  redness, blistering, peeling or loosening of the skin, including inside the mouth  restlessness, pacing, inability to keep still  seizures  stiff muscles  suicidal thoughts or other mood changes  trouble passing urine or change in the amount of urine  trouble sleeping  unusual bleeding or bruising  unusually weak or tired  vomiting Side effects that usually do not require medical attention (report to your doctor or health care professional if they continue or are bothersome):  change in sex drive or performance  change in appetite or weight  constipation  dizziness  dry mouth  headache  increased sweating  nausea  tired This list may not describe all possible side effects. Call your doctor for medical advice about side effects. You may report side effects to FDA at 1-800-FDA-1088. Where should I keep my medicine? Keep out of the reach of children. Store at a controlled temperature between 20 and 25 degrees C (68 and 77 degrees F), in a dry place. Throw away any unused medicine after the expiration date. NOTE: This sheet is a summary. It may not cover all possible information. If you have questions about this medicine, talk to your doctor, pharmacist, or health care provider.  2020 Elsevier/Gold Standard (2018-12-09 12:08:23)

## 2020-01-19 ENCOUNTER — Telehealth: Payer: Self-pay | Admitting: Pharmacist

## 2020-01-19 DIAGNOSIS — M069 Rheumatoid arthritis, unspecified: Secondary | ICD-10-CM | POA: Diagnosis not present

## 2020-01-19 DIAGNOSIS — M0609 Rheumatoid arthritis without rheumatoid factor, multiple sites: Secondary | ICD-10-CM | POA: Diagnosis not present

## 2020-01-19 NOTE — Telephone Encounter (Signed)
Received phone call from [facility] infusion asking for clearance to administer Simponi infusion.  Patient had her second dose of the COVID-19 vaccine yesterday.  Spoke with Dr. Estanislado Pandy.  ID recommends waiting 2 weeks after vaccine administration in order to build up a better immune response but also have to weigh the risk of a flare/control of autoimmune disease.  Patient was switched from [place] to [place] in November and had a psoriasis flare.  Dr. Estanislado Pandy would like for patient to go ahead and receive vaccine.  Pulmonary infusion noted and will proceed with infusion.   Mariella Saa, PharmD, [place], [facility] Pharmacist [phone]  01/19/2020 2:14 PM

## 2020-01-25 ENCOUNTER — Other Ambulatory Visit: Payer: Self-pay | Admitting: Rheumatology

## 2020-01-25 MED ORDER — NABUMETONE 750 MG PO TABS: 750.0000 mg | ORAL_TABLET | Freq: Two times a day (BID) | ORAL | 0 refills | Status: DC

## 2020-01-25 NOTE — Telephone Encounter (Signed)
To early to refill Flexeril.

## 2020-01-25 NOTE — Telephone Encounter (Signed)
Ok to refill 

## 2020-01-25 NOTE — Telephone Encounter (Signed)
Last Visit: 01/06/20 Next Visit: 04/08/20 Labs: 11/17/19 Glu is high, rest of the labs are stable.  Okay to refill Relafen?

## 2020-02-02 ENCOUNTER — Telehealth: Payer: Self-pay | Admitting: Cardiovascular Disease

## 2020-02-02 MED ORDER — LOSARTAN POTASSIUM 100 MG PO TABS: 100.0000 mg | ORAL_TABLET | Freq: Every day | ORAL | 11 refills | Status: DC

## 2020-02-02 NOTE — Telephone Encounter (Signed)
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 States BP earlier today at 1030 was 181/104. She verbalizes compliance with cardiac medications BP currently 166/85, pulse 74; states she has some headache and dizziness I asked about sodium in diet, which she states she monitors. Admits to drinking too much caffeine but has not changed that recently. Will work on better hydration with water. Denies recent illness, use of steroids, or use of OTC decongestants States she has not taken furosemide because she "is not really swollen" but she may take 1/2 furosemide (20 mg) to see if it helps her BP I advised that I will forward message to Dr. Burt Knack for advice and his primary nurse, Valetta Fuller, since she is due for annual follow-up next month. Patient verbalized understanding and agreement and thanked me for the call.

## 2020-02-02 NOTE — Telephone Encounter (Signed)
Patient called back because she could not hear my message I reviewed Dr. Antionette Char advice with her and she verbalized understanding and agreement with plan. She is aware we will call back to schedule her follow-up appointment. She thanked me for the call.

## 2020-02-02 NOTE — Telephone Encounter (Signed)
Looked over her chart. Would increase losartan to 100 mg. She started Effexor about 3 weeks ago and this can cause increased BP. That might be playing a role. Would continue to monitor and bring in readings at time of next visit. If BP remains severely elevated with SBP>180 or DBP > 95 please call back and we will adjust meds further.

## 2020-02-02 NOTE — Telephone Encounter (Signed)
Left detailed message with Dr. Antionette Char advice on patient's voice mail. I advised her to call back if BP remains elevated as outline by Dr. Burt Knack or if she has questions or concerns.

## 2020-02-02 NOTE — Telephone Encounter (Signed)
Pt c/o BP issue: STAT if pt c/o blurred vision, one-sided weakness or slurred speech  1. What are your last 5 BP readings? 181/104 2 hours ago 1 hour ago 174/90  2. Are you having any other symptoms (ex. Dizziness, headache, blurred vision, passed out)? Dizziness and headache  3. What is your BP issue? Seems to elevated for the last week.

## 2020-02-04 NOTE — Telephone Encounter (Signed)
Left message for patient that she has been scheduled for 3/18 at 1000. Instructed her to call back to confirm or cancel.

## 2020-02-05 NOTE — Telephone Encounter (Signed)
The patient reports 3/18 will not work for her.  Will cancel that appointment and place patient on the waiting list.  She was grateful for assistance.

## 2020-02-07 ENCOUNTER — Encounter: Payer: Self-pay | Admitting: Emergency Medicine

## 2020-02-07 ENCOUNTER — Ambulatory Visit
Admission: EM | Admit: 2020-02-07 | Discharge: 2020-02-07 | Disposition: A | Discharge status: Home / Self Care | Payer: 59 | Attending: Emergency Medicine | Admitting: Emergency Medicine

## 2020-02-07 ENCOUNTER — Other Ambulatory Visit: Payer: Self-pay

## 2020-02-07 DIAGNOSIS — Z8249 Family history of ischemic heart disease and other diseases of the circulatory system: Secondary | ICD-10-CM | POA: Insufficient documentation

## 2020-02-07 DIAGNOSIS — I2510 Atherosclerotic heart disease of native coronary artery without angina pectoris: Secondary | ICD-10-CM | POA: Diagnosis not present

## 2020-02-07 DIAGNOSIS — R197 Diarrhea, unspecified: Secondary | ICD-10-CM | POA: Insufficient documentation

## 2020-02-07 DIAGNOSIS — Z91013 Allergy to seafood: Secondary | ICD-10-CM | POA: Insufficient documentation

## 2020-02-07 DIAGNOSIS — Z20822 Contact with and (suspected) exposure to covid-19: Secondary | ICD-10-CM | POA: Diagnosis not present

## 2020-02-07 DIAGNOSIS — K219 Gastro-esophageal reflux disease without esophagitis: Secondary | ICD-10-CM | POA: Diagnosis not present

## 2020-02-07 DIAGNOSIS — Z91041 Radiographic dye allergy status: Secondary | ICD-10-CM | POA: Insufficient documentation

## 2020-02-07 DIAGNOSIS — M069 Rheumatoid arthritis, unspecified: Secondary | ICD-10-CM | POA: Insufficient documentation

## 2020-02-07 DIAGNOSIS — Z79899 Other long term (current) drug therapy: Secondary | ICD-10-CM | POA: Diagnosis not present

## 2020-02-07 DIAGNOSIS — Z9641 Presence of insulin pump (external) (internal): Secondary | ICD-10-CM | POA: Insufficient documentation

## 2020-02-07 DIAGNOSIS — I10 Essential (primary) hypertension: Secondary | ICD-10-CM | POA: Insufficient documentation

## 2020-02-07 DIAGNOSIS — Z888 Allergy status to other drugs, medicaments and biological substances status: Secondary | ICD-10-CM | POA: Insufficient documentation

## 2020-02-07 DIAGNOSIS — Z833 Family history of diabetes mellitus: Secondary | ICD-10-CM | POA: Diagnosis not present

## 2020-02-07 DIAGNOSIS — I252 Old myocardial infarction: Secondary | ICD-10-CM | POA: Insufficient documentation

## 2020-02-07 DIAGNOSIS — Z951 Presence of aortocoronary bypass graft: Secondary | ICD-10-CM | POA: Insufficient documentation

## 2020-02-07 DIAGNOSIS — E103599 Type 1 diabetes mellitus with proliferative diabetic retinopathy without macular edema, unspecified eye: Secondary | ICD-10-CM | POA: Insufficient documentation

## 2020-02-07 DIAGNOSIS — E782 Mixed hyperlipidemia: Secondary | ICD-10-CM | POA: Insufficient documentation

## 2020-02-07 DIAGNOSIS — Z820 Family history of epilepsy and other diseases of the nervous system: Secondary | ICD-10-CM | POA: Insufficient documentation

## 2020-02-07 DIAGNOSIS — Z7902 Long term (current) use of antithrombotics/antiplatelets: Secondary | ICD-10-CM | POA: Insufficient documentation

## 2020-02-07 DIAGNOSIS — Z794 Long term (current) use of insulin: Secondary | ICD-10-CM | POA: Insufficient documentation

## 2020-02-07 DIAGNOSIS — R112 Nausea with vomiting, unspecified: Secondary | ICD-10-CM | POA: Insufficient documentation

## 2020-02-07 LAB — URINALYSIS, COMPLETE (UACMP) WITH MICROSCOPIC
Bilirubin Urine: NEGATIVE
Glucose, UA: NEGATIVE mg/dL
Hgb urine dipstick: NEGATIVE
Ketones, ur: NEGATIVE mg/dL
Leukocytes,Ua: NEGATIVE
Nitrite: NEGATIVE
Protein, ur: NEGATIVE mg/dL
RBC / HPF: NONE SEEN RBC/hpf (ref 0–5)
Specific Gravity, Urine: <1.005 — ABNORMAL LOW (ref 1.005–1.030)
pH: 5.5 (ref 5.0–8.0)

## 2020-02-07 MED ORDER — PROMETHAZINE HCL 25 MG PO TABS: 25.0000 mg | ORAL_TABLET | Freq: Four times a day (QID) | ORAL | 0 refills | Status: DC | PRN

## 2020-02-07 MED ORDER — ONDANSETRON 8 MG PO TBDP: 8.0000 mg | ORAL_TABLET | Freq: Two times a day (BID) | ORAL | 0 refills | Status: DC

## 2020-02-07 NOTE — Discharge Instructions (Signed)
Be sure to increase your fluid intake to replace which are losing with the diarrhea and vomiting.  Your exam today was reassuring.  Your urinalysis was normal today.  Recommend following up with your primary care physician next week if you are not improving.  You were tested for Covid today and should remain quarantined until the results are available in 18 to 48 hours.

## 2020-02-07 NOTE — ED Triage Notes (Signed)
Patient states she has had both corona virus vaccines ~ 2 weeks ago.

## 2020-02-07 NOTE — ED Provider Notes (Signed)
[facility]    CSN: [id number] Arrival date & time: 02/07/20  0948      History   Chief Complaint Chief Complaint  Patient presents with  . Nausea    APPT  . Emesis  . Diarrhea    HPI [name] is a 54 y.o. female.   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  Complaines of sore throat that she has had for 3 to 4 days.  Along with the nausea vomiting diarrhea and sore throat she has had back pain and urinary urgency but no frequency or dysuria.  The diarrhea she attributes mostly to thyroid disease and has diarrhea on frequent occasions.  Has been loose but with some form.  She has vomited infrequently and has been using Zofran which she vomited up and then switched to Phenergan which has helped more.  She denies any fever or chills.  She has had no cough or congestion.  She has received 2 vaccines of Covid the last one in January around the 18th.  She works in endoscopy.  He does not look ill or toxic.  She is an insulin-dependent type 1 diabetic.         Past Medical History:  Diagnosis Date  . ACUT MI ANTEROLAT WALL SUBSQT EPIS CARE   . Acute maxillary sinusitis   . ALLERGIC RHINITIS, SEASONAL   . ARTHRITIS, RHEUMATOID    shoulders and hands Enbrel>leg swelling Dr. Estanislado Pandy  . Atrial fibrillation ([place])    a. after CABG.  . Back pain   . Bruxism (teeth grinding)   . CAD, ARTERY BYPASS GRAFT    a. DES to RCA in 2010 then LAD occlusion s/p CABG 3 06/07/2009 with LIMA to LAD, reverse SVG to D1, reverse SVG to distal RCA. b. Cath 05/08/2016 slightly hypodense region in the intermediate branch, however she had excellent flow, FFR was normal. Vein graft to PDA and the posterolateral branch is patent, patent LIMA to LAD, occluded SVG to diagonal.  . CARPAL TUNNEL SYNDROME, BILATERAL   . CHOLELITHIASIS   . Contrast media allergy   . DERMATITIS, ALLERGIC   . DIABETES MELLITUS, TYPE I    on insulin  pump dx'ed age 72 y.o   . DIABETIC  RETINOPATHY   . Hiatal hernia   . HYPERLIPIDEMIA-MIXED   . HYPERTHYROIDISM    Dr. Dollene Cleveland  . Insulin pump in place   . MIGRAINE W/O AURA W/INTRACT W/STATUS MIGRAINOSUS 02/19/2008  . Psoriasis   . SINUS TACHYCARDIA 11/08/2010  . SVT (supraventricular tachycardia) ([facility])    after s/p CABG  . TRIGGER FINGER    all fingers b/l hands   . URI     Patient Active Problem List   Diagnosis Date Noted  . Type 1 diabetes mellitus with proliferative retinopathy ([place]) 12/10/2019  . Abnormal thyroid function test 12/10/2019  . Bruxism (teeth grinding)   . Chronic migraine 10/07/2019  . Contusion of left hip 07/25/2019  . H/O multiple allergies 03/10/2019  . Hx of anaphylaxis 03/10/2019  . Cough 06/26/2018  . PND (post-nasal drip) 06/26/2018  . Vitamin D deficiency 07/04/2017  . B12 deficiency 07/04/2017  . Abnormal CT scan 06/19/2017  . Gastroesophageal reflux disease 06/19/2017  . Antiplatelet or antithrombotic long-term use 06/19/2017  . Nasal congestion 03/15/2017  . Graves disease 02/01/2017  . Sleep difficulties 02/01/2017  . No energy 02/01/2017  . Osteopenia 02/01/2017  . Hypertension,  essential 05/08/2016  . IDDM (insulin dependent diabetes mellitus) 05/08/2016  . Coronary artery disease involving native coronary artery of native heart with angina pectoris ([place])   . Hyperthyroidism 11/09/2010  . SINUS TACHYCARDIA 11/08/2010  . DERMATITIS, ALLERGIC 07/20/2010  . EDEMA 07/12/2010  . DIZZINESS 11/14/2009  . ATRIAL FIBRILLATION 07/20/2009  . CAD, ARTERY BYPASS GRAFT 06/07/2009  . ANGINA, STABLE/EXERTIONAL 06/02/2009  . HYPERLIPIDEMIA-MIXED 04/26/2009  . ACUT MI ANTEROLAT WALL SUBSQT EPIS CARE 04/26/2009  . Chest pain 04/26/2009  . DIABETIC  RETINOPATHY 04/25/2009  . CARPAL TUNNEL SYNDROME, BILATERAL 04/25/2009  . TRIGGER FINGER 04/25/2009  . MIGRAINE W/O AURA W/INTRACT W/STATUS MIGRAINOSUS 02/19/2008  . ALLERGIC RHINITIS, SEASONAL  10/16/2006  . CHOLELITHIASIS 10/16/2006  . Rheumatoid arthritis ([place]) 10/16/2006    Past Surgical History:  Procedure Laterality Date  . ABDOMINAL HYSTERECTOMY     endometriomas b/l; total ? cervix removed; no h/o abnormal pap  . Caesarean section    . CARDIAC CATHETERIZATION N/A 05/08/2016   Procedure: Left Heart Cath and Cors/Grafts Angiography;  Surgeon: Burnell Blanks, MD;  Location: [facility];  Service: Cardiovascular;  Laterality: N/A;  . CARPAL TUNNEL RELEASE     b/l   . CATARACT EXTRACTION, BILATERAL    . CHOLECYSTECTOMY    . CORONARY ARTERY BYPASS GRAFT    . EYE SURGERY     laser x 2 for retinopathy   . TRIGGER FINGER RELEASE     b/l fingers all   . VITRECTOMY     b/l     OB History   No obstetric history on file.      Home Medications    Prior to Admission medications   Medication Sig Start Date End Date Taking? Authorizing Provider  clopidogrel (PLAVIX) 75 MG tablet TAKE 1 TABLET BY MOUTH ONCE DAILY 05/15/19  Yes Sherren Mocha, MD  cyclobenzaprine (FLEXERIL) 10 MG tablet TAKE 1 TABLET BY MOUTH AT BEDTIME. 01/13/20  Yes Deveshwar, Abel Presto, MD  Diclofenac Potassium (CAMBIA) 50 MG PACK Take 50 mg by mouth as needed. 10/08/19  Yes Suzzanne Cloud, NP  EPINEPHrine 0.3 mg/0.3 mL IJ SOAJ injection Inject 0.3 mLs (0.3 mg total) into the muscle as needed for anaphylaxis. 03/09/19  Yes Breeback, Jade L, PA-C  Esomeprazole Magnesium (NEXIUM 24HR PO) Take 20 mg by mouth daily.    Yes [provider]  folic acid (FOLVITE) 1 MG tablet .AKE 2 TABLETS (2 MG TOTAL) BY MOUTH DAILY. 11/30/19  Yes Ofilia Neas, PA-C  furosemide (LASIX) 40 MG tablet Take 40 mg by mouth daily as needed for edema (When she feels like she is swelling).   Yes [provider]  Golimumab ([place] ARIA IV) Inject into the vein.   Yes [provider]  insulin lispro (HUMALOG) 100 UNIT/ML injection FOR USE IN INSULIN PUMP. TOTAL DAILY INSULIN DOSE = UP TO 90 UNITS.  03/24/15  Yes [provider]  isosorbide mononitrate (IMDUR) 60 MG 24 hr tablet Take 1 tablet (60 mg total) by mouth daily. 03/04/19  Yes Sherren Mocha, MD  leflunomide (ARAVA) 20 MG tablet Take 1 tablet (20 mg total) by mouth daily. 01/06/20  Yes Ofilia Neas, PA-C  losartan (COZAAR) 100 MG tablet Take 1 tablet (100 mg total) by mouth daily. 02/02/20  Yes Sherren Mocha, MD  methimazole (TAPAZOLE) 5 MG tablet Take by mouth.  08/10/19  Yes [provider]  metoprolol tartrate (LOPRESSOR) 50 MG tablet Take 1 tablet (50 mg total) by mouth 2 (  two) times daily. 03/04/19 02/27/20 Yes Sherren Mocha, MD  montelukast (SINGULAIR) 10 MG tablet TAKE 1 TABLET BY MOUTH AT BEDTIME 12/14/19  Yes Breeback, Jade L, PA-C  mupirocin ointment (BACTROBAN) 2 % Apply 1 application topically 3 (three) times daily. 11/15/19  Yes Lorin Picket, PA-C  nabumetone (RELAFEN) 750 MG tablet Take 1 tablet (750 mg total) by mouth 2 (two) times daily. 01/25/20  Yes Deveshwar, Abel Presto, MD  Omega-3 Fatty Acids (FISH OIL) 1200 MG CAPS Take 1,200 mg by mouth 2 (two) times daily.   Yes [provider]  ONE TOUCH ULTRA TEST test strip  11/20/16  Yes [provider]  potassium chloride (K-DUR) 10 MEQ tablet Take 1 tablet (10 mEq total) by mouth daily. Patient taking differently: 10 mEq daily as needed.  07/11/17  Yes Sherren Mocha, MD  rosuvastatin (CRESTOR) 20 MG tablet Take 1 tablet (20 mg total) by mouth daily. 03/04/19 02/27/20 Yes Sherren Mocha, MD  ondansetron (ZOFRAN ODT) 8 MG disintegrating tablet Take 1 tablet (8 mg total) by mouth 2 (two) times daily. 02/07/20   Lorin Picket, PA-C  promethazine (PHENERGAN) 25 MG tablet Take 1 tablet (25 mg total) by mouth every 6 (six) hours as needed for nausea or vomiting. 02/07/20   Lorin Picket, PA-C  venlafaxine XR (EFFEXOR XR) 37.5 MG 24 hr capsule Take 1 capsule (37.5 mg total) by mouth at bedtime. 01/13/20 02/07/20  Suzzanne Cloud, NP    Family  History Family History  Problem Relation Age of Onset  . Depression Mother   . Anxiety disorder Mother   . Colon polyps Father   . Diabetes Father        2  . Hypertension Father   . Parkinson's disease Father   . Diabetes Other   . Heart disease Other   . Hypertension Other   . Hyperlipidemia Other   . Depression Other   . Migraines Other   . Stroke Paternal Grandmother   . Heart attack Neg Hx   . Colon cancer Neg Hx   . Stomach cancer Neg Hx   . Esophageal cancer Neg Hx     Social History Social History   Tobacco Use  . Smoking status: Never Smoker  . Smokeless tobacco: Never Used  Substance Use Topics  . Alcohol use: Yes    Comment: rarely 1 every 6 months  . Drug use: No     Allergies   Prochlorperazine, Ramipril, Shellfish-derived products, Atorvastatin, Compazine  [prochlorperazine edisylate], Etanercept, Infliximab, Iohexol, Orencia [abatacept], Shellfish allergy, Tofacitinib, Actemra [tocilizumab], Emgality [galcanezumab-gnlm], Prochlorperazine edisylate, Trokendi xr [topiramate er], Amiodarone, and Rituximab   Review of Systems Review of Systems  Constitutional: Positive for activity change, appetite change and chills. Negative for fatigue and fever.  HENT: Positive for sore throat.   Respiratory: Negative for cough, shortness of breath, wheezing and stridor.   Genitourinary: Positive for urgency. Negative for dysuria, frequency and vaginal discharge.  Musculoskeletal: Positive for back pain.  All other systems reviewed and are negative.    Physical Exam Triage Vital Signs ED Triage Vitals  Enc Vitals Group     BP 02/07/20 0959 (!) 154/71     Pulse Rate 02/07/20 0959 62     Resp 02/07/20 0959 18     Temp 02/07/20 0959 98.4 F (36.9 C)     Temp Source 02/07/20 0959 Oral     SpO2 02/07/20 0959 100 %     Weight 02/07/20 1000 168 lb 12.8 oz (  76.6 kg)     Height 02/07/20 1000 5\' 3"  (1.6 m)     Head Circumference --      Peak Flow --      Pain  Score 02/07/20 1000 6     Pain Loc --      Pain Edu? --      Excl. in [place]? --    No data found.  Updated Vital Signs BP (!) 154/71 (BP Location: Left Arm)   Pulse 62   Temp 98.4 F (36.9 C) (Oral)   Resp 18   Ht 5\' 3"  (1.6 m)   Wt 168 lb 12.8 oz (76.6 kg)   SpO2 100%   BMI 29.90 kg/m   Visual Acuity Right Eye Distance:   Left Eye Distance:   Bilateral Distance:    Right Eye Near:   Left Eye Near:    Bilateral Near:     Physical Exam Vitals and nursing note reviewed.  Constitutional:      General: She is not in acute distress.    Appearance: Normal appearance. She is not ill-appearing, toxic-appearing or diaphoretic.  HENT:     Head: Normocephalic and atraumatic.     Nose: Nose normal.     Mouth/Throat:     Mouth: Mucous membranes are moist.     Pharynx: No oropharyngeal exudate or posterior oropharyngeal erythema.  Eyes:     Conjunctiva/sclera: Conjunctivae normal.  Cardiovascular:     Rate and Rhythm: Normal rate and regular rhythm.     Heart sounds: Normal heart sounds.  Pulmonary:     Effort: Pulmonary effort is normal.     Breath sounds: Normal breath sounds.  Abdominal:     General: Abdomen is flat. There is no distension.     Tenderness: There is abdominal tenderness. There is no guarding or rebound.     Comments: Mild tenderness elicited in the right lower quadrant.  No guarding or rebound present.  Glucose pump is attached to the abdominal skin.  Musculoskeletal:        General: Normal range of motion.     Cervical back: Normal range of motion and neck supple.  Skin:    General: Skin is warm and dry.  Neurological:     General: No focal deficit present.     Mental Status: She is alert and oriented to person, place, and time.  Psychiatric:        Mood and Affect: Mood normal.        Behavior: Behavior normal.        Thought Content: Thought content normal.        Judgment: Judgment normal.      UC Treatments / Results  Labs (all labs ordered  are listed, but only abnormal results are displayed) Labs Reviewed  URINALYSIS, COMPLETE (UACMP) WITH MICROSCOPIC - Abnormal; Notable for the following components:      Result Value   Specific Gravity, Urine <1.005 (*)    Bacteria, UA FEW (*)    All other components within normal limits  NOVEL CORONAVIRUS, NAA (HOSP ORDER, SEND-OUT TO REF LAB; TAT 18-24 HRS)    EKG   Radiology No results found.  Procedures Procedures (including critical care time)  Medications Ordered in UC Medications - No data to display  Initial Impression / Assessment and Plan / UC Course  I have reviewed the triage vital signs and the nursing notes.  Pertinent labs & imaging results that were available during my care of the patient  were reviewed by me and considered in my medical decision making (see chart for details).   89-year-old female presents today with nausea vomiting diarrhea she has had for 2 days.  She is also complaining of sore throat 3 to 4 days back pain and urinary urgency x1 week.  She has had no frequency or dysuria.  She denies any vaginal discharge.  She also denies fever cough or congestion.  No signs were normal.  Except for elevated blood elevated blood pressure 154/71.  Physical exam was unremarkable.  Laboratory showing a urinalysis that was negative.  There is not any indication of a urinary tract infection.  We did also test her for coronavirus.  This was despite her having to vaccine doses.  I have told her that we will at the present time control her nausea with Zofran and if that has not beneficial Phenergan.  She was encouraged to increase her intake of fluids as much as possible.  She is not improving she should follow-up with her primary care physician.  Final Clinical Impressions(s) / UC Diagnoses   Final diagnoses:  Nausea vomiting and diarrhea     Discharge Instructions     Be sure to increase your fluid intake to replace which are losing with the diarrhea and  vomiting.  Your exam today was reassuring.  Your urinalysis was normal today.  Recommend following up with your primary care physician next week if you are not improving.  You were tested for Covid today and should remain quarantined until the results are available in 18 to 48 hours.   ED Prescriptions    Medication Sig Dispense Auth. Provider   ondansetron (ZOFRAN ODT) 8 MG disintegrating tablet Take 1 tablet (8 mg total) by mouth 2 (two) times daily. 6 tablet Crecencio Mc P, PA-C   promethazine (PHENERGAN) 25 MG tablet Take 1 tablet (25 mg total) by mouth every 6 (six) hours as needed for nausea or vomiting. 20 tablet Lorin Picket, PA-C     PDMP not reviewed this encounter.   Lorin Picket, PA-C 02/07/20 1734

## 2020-02-07 NOTE — ED Triage Notes (Signed)
Patient in today c/o N/V/D x 2 days, sore throat x 3-4 days, back pain and urinary urgency x 1 week. Patient denies fever, cough, congestion.

## 2020-02-08 LAB — NOVEL CORONAVIRUS, NAA (HOSP ORDER, SEND-OUT TO REF LAB; TAT 18-24 HRS): SARS-CoV-2, NAA: NOT DETECTED

## 2020-02-09 ENCOUNTER — Telehealth: Payer: Self-pay | Admitting: Internal Medicine

## 2020-02-09 NOTE — Telephone Encounter (Signed)
Rec go to ED or UC ([facility]) for further work up Could be kidney stone vs other cause likely needs some imaging wounds like   [place]

## 2020-02-09 NOTE — Telephone Encounter (Signed)
Patient still feeling nausea, lower back pain, Tenderness right side close to hip bone. UC did not do any imaging. Last seen [facility] Sunday. Patient was notified if having sx call PCP. She was tested for covid which was negative. Had 2nd covid shot a couple weeks ago. Taking zofran and phenergan as needed.

## 2020-02-09 NOTE — Telephone Encounter (Signed)
Pt went to urgent care on Sunday for flu like symptoms. They tested her for Covid and it was negative. Pt has had both Pfizer vaccines and the flu shot. They did not test for the flu. Pt wants a call back today.

## 2020-02-10 NOTE — Telephone Encounter (Signed)
Called the patient and informed her of the below. Patient states that her insurance favors PCP appointments where she would have to pay $75 for Urgent Care. Patient scheduled for open slot with Dr Olivia Mackie on Tuesday, 02/16/20.

## 2020-02-11 ENCOUNTER — Telehealth: Payer: Self-pay | Admitting: Cardiovascular Disease

## 2020-02-11 MED ORDER — AMLODIPINE BESYLATE 5 MG PO TABS: 5.0000 mg | ORAL_TABLET | Freq: Every day | ORAL | 11 refills | Status: DC

## 2020-02-11 NOTE — Telephone Encounter (Signed)
Reviewed medications. Wonder if she can take amlodipine? If so, would start her on amlodipine 5 mg daily. She's due for annual follow-up soon - let's bring her in next available. thx

## 2020-02-11 NOTE — Telephone Encounter (Signed)
Instructed patient to START AMLODIPINE 5 mg daily. Offered to schedule patient in a couple weeks to assess medication effects, but she requested to be seen sooner. Scheduled patient for Monday afternoon with Melina Copa. She was grateful for assistance.

## 2020-02-11 NOTE — Telephone Encounter (Signed)
I spoke to the patient who called because her BP continues to be elevated, last night 164/94 and today 158/56.  She "just does not feel well" with symptoms of headache, dizziness and nausea.    She denies SOB, but is having "mild chest discomfort" at times.    Her Losartan was increased to 100 mg daily recently and was told to call if BP persists elevated for further advisement.

## 2020-02-11 NOTE — Telephone Encounter (Signed)
Pt c/o BP issue: STAT if pt c/o blurred vision, one-sided weakness or slurred speech  1. What are your last 5 BP readings?   158/86 HR 65  2. Are you having any other symptoms (ex. Dizziness, headache, blurred vision, passed out)? Dizziness, headache, & nausea   3. What is your BP issue? Patient is calling stating she is still experiencing hypertension. She did not have any other BP readings besides today's due to being at work. She states she has been experiencing dizzy spells, headaches, and nausea when the high BP occurs. Please advise.

## 2020-02-14 NOTE — Progress Notes (Addendum)
Cardiology Office Note  Date: 02/15/2020   ID: [name], Lisa 02/17/1966, MRN [id number]  PCP:  McLean-Scocuzza, Nino Glow, MD  Cardiologist:  Sherren Mocha, MD Electrophysiologist:  None   Chief Complaint  Patient presents with  . Chest Pain    CP, Hx of CAD S/p 3 V CABG 2010    History of Present Illness: [name] is a 54 y.o. female with history of CAD, inferior MI PCI to RCA. CTO of LAD with multivessel CABG 2010. History of GERD, hypertension, hyperlipidemia.  Repeat cath in 2017 showed marked coronary artery disease with patent grafts to LAD and RCA.  SVG to diagonal occluded.  Previously complained of some shortness of breath with activity and some leg swelling improved with Lasix. Stress Myoview for chest pain 05/2018 showed no significant ischemia or scar. Low risk study.  History of tachycardia secondary to Graves' disease.  Recent  TSH (< 0.01), T4 (1.73) , T3 ( 3.2).  Beta-blocker was adjusted and heart rate was better controlled.   Blood pressure elevated on 02/02/2020 with symptoms of h/a, dizziness, nausea.  She was having mild chest discomfort. Losartan was increased to 100 mg daily.  Amlodipine 5 mg was started. She was advised via telephone to come in for annual follow up a little sooner per Dr Burt Knack.    She continues to complain of episodes of chest pain, neck pain, back pain, nausea, and diaphoresis with or without exertion for 2-3 weeks. She states these symptoms can become worse with exertion. She also has some atypical symptoms with throat discomfort. She mentions that she knows it has been ten years since her bypass surgery and is concerned one of her bypass grafts may be occluded. She states she is tired of feeling bad and wants to find the cause of her symptoms. She states theses symptoms feel similar to the symptoms she was having prior to the bypass surgery in 2010.   States her BP is much better since increasing the Losartan dosage and starting  Amlodipine. Having much less dizziness and headaches since that time.    Past Medical History:  Diagnosis Date  . ACUT MI ANTEROLAT WALL SUBSQT EPIS CARE   . Acute maxillary sinusitis   . ALLERGIC RHINITIS, SEASONAL   . ARTHRITIS, RHEUMATOID    shoulders and hands Enbrel>leg swelling Dr. Estanislado Pandy  . Atrial fibrillation ([place])    a. after CABG.  . Back pain   . Bruxism (teeth grinding)   . CAD, ARTERY BYPASS GRAFT    a. DES to RCA in 2010 then LAD occlusion s/p CABG 3 06/07/2009 with LIMA to LAD, reverse SVG to D1, reverse SVG to distal RCA. b. Cath 05/08/2016 slightly hypodense region in the intermediate branch, however she had excellent flow, FFR was normal. Vein graft to PDA and the posterolateral branch is patent, patent LIMA to LAD, occluded SVG to diagonal.  . CARPAL TUNNEL SYNDROME, BILATERAL   . CHOLELITHIASIS   . Contrast media allergy   . DERMATITIS, ALLERGIC   . DIABETES MELLITUS, TYPE I    on insulin pump dx'ed age 26 y.o   . DIABETIC  RETINOPATHY   . Hiatal hernia   . HYPERLIPIDEMIA-MIXED   . HYPERTHYROIDISM    Dr. Dollene Cleveland  . Insulin pump in place   . MIGRAINE W/O AURA W/INTRACT W/STATUS MIGRAINOSUS 02/19/2008  . Psoriasis   . SINUS TACHYCARDIA 11/08/2010  . SVT (supraventricular tachycardia) (HCC)    after s/p CABG  .  TRIGGER FINGER    all fingers b/l hands   . URI     Past Surgical History:  Procedure Laterality Date  . ABDOMINAL HYSTERECTOMY     endometriomas b/l; total ? cervix removed; no h/o abnormal pap  . Caesarean section    . CARDIAC CATHETERIZATION N/A 05/08/2016   Procedure: Left Heart Cath and Cors/Grafts Angiography;  Surgeon: Burnell Blanks, MD;  Location: [facility];  Service: Cardiovascular;  Laterality: N/A;  . CARPAL TUNNEL RELEASE     b/l   . CATARACT EXTRACTION, BILATERAL    . CHOLECYSTECTOMY    . CORONARY ARTERY BYPASS GRAFT    . EYE SURGERY     laser x 2 for retinopathy   . TRIGGER FINGER RELEASE     b/l fingers  all   . VITRECTOMY     b/l     Current Outpatient Medications  Medication Sig Dispense Refill  . amLODipine (NORVASC) 5 MG tablet Take 1 tablet (5 mg total) by mouth daily. 30 tablet 11  . clopidogrel (PLAVIX) 75 MG tablet TAKE 1 TABLET BY MOUTH ONCE DAILY 90 tablet 2  . cyclobenzaprine (FLEXERIL) 10 MG tablet Take 1 tablet (10 mg total) by mouth at bedtime. 30 tablet 0  . Diclofenac Potassium (CAMBIA) 50 MG PACK Take 50 mg by mouth as needed. 12 each 11  . Esomeprazole Magnesium (NEXIUM 24HR PO) Take 20 mg by mouth daily.     . folic acid (FOLVITE) 1 MG tablet .AKE 2 TABLETS (2 MG TOTAL) BY MOUTH DAILY. 180 tablet 1  . furosemide (LASIX) 40 MG tablet Take 40 mg by mouth daily as needed for edema (When she feels like she is swelling).    . Golimumab ([place] ARIA IV) Inject into the vein.    Marland Kitchen insulin lispro (HUMALOG) 100 UNIT/ML injection FOR USE IN INSULIN PUMP. TOTAL DAILY INSULIN DOSE = UP TO 90 UNITS.    Marland Kitchen isosorbide mononitrate (IMDUR) 60 MG 24 hr tablet Take 1 tablet (60 mg total) by mouth daily. 30 tablet 11  . leflunomide (ARAVA) 20 MG tablet Take 1 tablet (20 mg total) by mouth daily. 90 tablet 0  . losartan (COZAAR) 100 MG tablet Take 1 tablet (100 mg total) by mouth daily. 30 tablet 11  . methimazole (TAPAZOLE) 5 MG tablet 5 mg. Take 2 tablets by mouth in the morning and one tablet in the evening    . metoprolol tartrate (LOPRESSOR) 50 MG tablet Take 1 tablet (50 mg total) by mouth 2 (two) times daily. 180 tablet 3  . montelukast (SINGULAIR) 10 MG tablet TAKE 1 TABLET BY MOUTH AT BEDTIME 30 tablet 2  . nabumetone (RELAFEN) 750 MG tablet Take 1 tablet (750 mg total) by mouth 2 (two) times daily. 60 tablet 0  . Omega-3 Fatty Acids (FISH OIL) 1200 MG CAPS Take 1,200 mg by mouth 2 (two) times daily.    . ondansetron (ZOFRAN ODT) 8 MG disintegrating tablet Take 1 tablet (8 mg total) by mouth 2 (two) times daily. 6 tablet 0  . potassium chloride (K-DUR) 10 MEQ tablet Take 1 tablet (10  mEq total) by mouth daily. (Patient taking differently: 10 mEq daily as needed. ) 90 tablet 3  . promethazine (PHENERGAN) 25 MG tablet Take 1 tablet (25 mg total) by mouth every 6 (six) hours as needed for nausea or vomiting. 20 tablet 0  . EPINEPHrine 0.3 mg/0.3 mL IJ SOAJ injection Inject 0.3 mLs (0.3 mg total) into the muscle  as needed for anaphylaxis. 1 Device prn  . nitroGLYCERIN (NITROSTAT) 0.4 MG SL tablet Place 1 tablet (0.4 mg total) under the tongue every 5 (five) minutes as needed. 25 tablet 3  . ONE TOUCH ULTRA TEST test strip   1  . pantoprazole (PROTONIX) 40 MG tablet Take 1 tablet (40 mg total) by mouth daily. 30 tablet 11  . predniSONE (DELTASONE) 50 MG tablet TAKE 1 TABLET BY MOUTH 13 HOURS PRIOR TO YOUR PROCEDURE, TAKE 1 TABLE BY MOUTH 7 HOURS PRIOR TO YOUR PROCEDURE, TAKE 1 TABLET BY MOUTH AS YOU ARE LEAVING HOME FOR YOUR PROCEDURE ALONG WITH A BENADRYL 50 MG TABLET 3 tablet 0  . rosuvastatin (CRESTOR) 20 MG tablet Take 1 tablet (20 mg total) by mouth daily. 90 tablet 3   Current Facility-Administered Medications  Medication Dose Route Frequency Provider Last Rate Last Admin  . sodium chloride flush (NS) 0.9 % injection 3 mL  3 mL Intravenous Q12H Verta Ellen., NP       Allergies:  Prochlorperazine, Ramipril, Shellfish-derived products, Atorvastatin, Compazine  [prochlorperazine edisylate], Etanercept, Infliximab, Iohexol, Orencia [abatacept], Shellfish allergy, Tofacitinib, Actemra [tocilizumab], Emgality [galcanezumab-gnlm], Prochlorperazine edisylate, Trokendi xr [topiramate er], Amiodarone, and Rituximab   Social History: The patient  reports that she has never smoked. She has never used smokeless tobacco. She reports current alcohol use. She reports that she does not use drugs.   Family History: The patient's family history includes Anxiety disorder in her mother; Colon polyps in her father; Depression in her mother and another family member; Diabetes in her father and  another family member; Heart disease in an other family member; Hyperlipidemia in an other family member; Hypertension in her father and another family member; Migraines in an other family member; Parkinson's disease in her father; Stroke in her paternal grandmother.   ROS:  Please see the history of present illness. Otherwise, complete review of systems is positive for none.  All other systems are reviewed and negative.   Physical Exam: VS:  BP 120/68   Pulse 61   Ht 5\' 3"  (1.6 m)   Wt 168 lb (76.2 kg)   BMI 29.76 kg/m , BMI Body mass index is 29.76 kg/m.  Wt Readings from Last 3 Encounters:  02/15/20 168 lb (76.2 kg)  02/07/20 168 lb 12.8 oz (76.6 kg)  01/13/20 166 lb 6.4 oz (75.5 kg)    General: Patient appears comfortable at rest. HEENT: Conjunctiva and lids normal, oropharynx clear with moist mucosa. Neck: Supple, no elevated JVP or carotid bruits, no thyromegaly. Lungs: Clear to auscultation, nonlabored breathing at rest. Cardiac: Regular rate and rhythm, no S3 or significant systolic murmur, no pericardial rub. Abdomen: Soft, nontender, no hepatomegaly, bowel sounds present, no guarding or rebound. Extremities: No pitting edema, distal pulses 2+. Skin: Warm and dry. Musculoskeletal: No kyphosis. Neuropsychiatric: Alert and oriented x3, affect grossly appropriate.  ECG:    Recent Labwork: 11/17/2019: ALT 21; AST 24; BUN 11; Creat 0.98; Hemoglobin 13.1; Platelets 151; Potassium 4.4; Sodium 139 01/04/2020: TSH <0.01 Repeated and verified X2.     Component Value Date/Time   CHOL 166 01/04/2020 0937   TRIG 151.0 (H) 01/04/2020 0937   HDL 78.10 01/04/2020 0937   CHOLHDL 2 01/04/2020 0937   VLDL 30.2 01/04/2020 0937   LDLCALC 58 01/04/2020 0937    Other Studies Reviewed Today:   Echocardiogram March 11, 2019 IMPRESSIONS  1. The left ventricle has normal systolic function with an ejection fraction of 60-65%. The cavity  size was normal. Left ventricular diastolic Doppler  parameters are consistent with impaired relaxation. Indeterminate filling pressures.  2. The right ventricle has normal systolic function. The cavity was normal. There is no increase in right ventricular wall thickness.    Lexiscan Myoview stress test 05/2018  Probable normal perfusion and soft tissue attenuation Diaphragm No significant ischemia or scar  Nuclear stress EF: 62%.  This is a low risk study.   Cardiac catheterization May 08, 2016 1. Severe double vessel CAD s/p 3V CABG 2. The LAD has diffuse severe proximal stenosis. There is both antegrade flow down the LAD and retrograde filling from the patent LIMA graft. The Diagonal that was supplied by the vein graft is small in caliber. The vein graft to the diagonal is occluded.  3. The Circumflex is small in caliber and has mild plaque disease. The intermediate branch is large in caliber. The intermediate branch has a proximal 30% stenosis, slightly hypodense in appearance but with excellent flow down the vessel. (FFR of this vessel is 0.91 with infusion of adenosine suggesting no significant flow obstruction). It is unclear if this represents a non-flow limiting plaque rupture or hypodense calcific lesion.  4. The RCA is previously stented in the mid segment. The mid stented segment is completely occluded. The vein graft to the PDA and posterolateral branches is patent.   Recommendations: She presented with continuous chest pain for 48 hours with no objective evidence of ischemia (negative troponin, no ischemic EKG changes). No culprit lesion is noted. As above, the mildly hypodense plaque in the intermediate branch could represent plaque rupture without flow obstruction but FFR is in a normal range. I do not think this lesion is causing her chest pain given the excellent flow down the vessel. I will treat with ASA, Plavix in case of plaque rupture and will start Imdur. Consider GI related cause of her chest pain  Assessment and  Plan:  1. Coronary artery disease involving native coronary artery of native heart with angina pectoris ([place])   2. Essential hypertension   3. Hyperlipidemia LDL goal <70   4. Type 1 diabetes mellitus with other circulatory complication (HCC)    1. Coronary artery disease involving native coronary artery of native heart with angina pectoris [facility]) Discussed patient's symptoms with primary cardiologist Dr Burt Knack. He agrees patient needs cardiac catheterization. Schedule for outpatient left heart cath On Monday Feb 22nd Dr. Angelena Form secondary to c/o chest pain ,nausea, neck pain, diaphoresis, with and without exertion. We offered a sooner date for end of this week but she wished to defer until next week. Get CBC BMET today. Hx of CABG 2010 3 vessel. Cath 2017  mildly hypodense plaque in the intermediate branch could represent plaque rupture without flow obstruction but FFR is in a normal range. Advised patient to start back on daily ASA 81 mg daily  today. Change Nexium to pantoprazole 40 mg d/t Plavix therapy. Discussed cardiac catheterization risks and benefits with patient and she verbalizes understanding and consents to catheterization. Patient has a contrast dye allergy and will be pre-treated and post treated with prednisone and benadryl.   2. Essential hypertension BP 128 68 today after Losartan increased to 100mg  daily and Amlodipine 5 mg daily started. Continue Losartan and Amlodipine.  3. Hyperlipidemia LDL goal <70 Last LDL 58. 01/04/2020 Continue  Rosuvastatin 20 mg dialy   4. Type 1 diabetes mellitus with other circulatory complication [facility]) Patient uses an insulin pump. Last Hgb A1c 7%. Patient  managed by  endocrinology.  Medication Adjustments/Labs and Tests Ordered: Current medicines are reviewed at length with the patient today.  Concerns regarding medicines are outlined above.    Patient Instructions  Medication Instructions:  Your physician has recommended you make the  following change in your medication:  1.  RESTART Aspirin 81 mg take 1 tablet daily 2.  STOP Nexium 3.  START Protonix 40 mg taking 1 tablet daily   *If you need a refill on your cardiac medications before your next appointment, please call your pharmacy*  Lab Work: TODAY:  BMET & CBC  Friday 02/19/2020:  [address], [place] FOR YOUR COVID TEST AT 11:30   If you have labs (blood work) drawn today and your tests are completely normal, you will receive your results only by: Marland Kitchen MyChart Message (if you have MyChart) OR . A paper copy in the mail If you have any lab test that is abnormal or we need to change your treatment, we will call you to review the results.  Testing/Procedures:    [facility] OFFICE [place], [place] [address] Dept: [phone] Loc: [phone]  TAKEA CARLOW  02/15/2020  You are scheduled for a Cardiac Catheterization on Monday, February 22 with Dr. [name].  1. Please arrive at the [facility] (Main Entrance A) at [facility]: [address] [place],  [address] at 5:30 AM (This time is two hours before your procedure to ensure your preparation). Free valet parking service is available.   Special note: Every effort is made to have your procedure done on time. Please understand that emergencies sometimes delay scheduled procedures.  2. Diet: Do not eat solid foods after midnight.  The patient may have clear liquids until 5am upon the day of the procedure.  3. Labs: You will need to have blood drawn on TODAY 4. Medication instructions in preparation for your procedure:   Contrast Allergy: Yes, Please take Prednisone 50mg  by mouth at: Thirteen hours prior to cath 6:00am on Sunday Seven hours prior to cath 12:00am on Monday And prior to leaving home please take last dose of Prednisone 50mg  and  Benadryl 50mg  by mouth.   Stop taking, Lasix (Furosemide)  Monday, February 22,  WE WILL CALL YOU REGARDING YOUR INSULIN DOSAGE WITH THE INSULIN PUMP  On the morning of your procedure, take your Aspirin and any morning medicines NOT listed above.  You may use sips of water.  5. Plan for one night stay--bring personal belongings. 6. Bring a current list of your medications and current insurance cards. 7. You MUST have a responsible person to drive you home. 8. Someone MUST be with you the first 24 hours after you arrive home or your discharge will be delayed. 9. Please wear clothes that are easy to get on and off and wear slip-on shoes.  Thank you for allowing [place] to care for you!   -- [facility]    Follow-Up: At [facility], you and your health needs are our priority.  As part of our continuing mission to provide you with exceptional heart care, we have created designated Provider Care Teams.  These Care Teams include your primary Cardiologist (physician) and Advanced Practice Providers (APPs -  Physician Assistants and Nurse Practitioners) who all work together to provide you with the care you need, when you need it.  Your next appointment:  03/09/2020 ARRIVE AT 11:30 FOR REGISTRATION  The format for your next appointment:   In Person  Provider:   You may see Sherren Mocha, MD or one of the following Advanced Practice Providers on your designated Care Team:    Richardson Dopp, PA-C  [place], PA-C  Daune Perch, NP   Signed, Levell July, NP 02/15/2020 8:55 PM    [place]

## 2020-02-14 NOTE — H&P (View-Only) (Signed)
Cardiology Office Note  Date: 02/15/2020   ID: Fleeta, Ames [birth year], MRN [id number]  PCP:  McLean-Scocuzza, Nino Glow, MD  Cardiologist:  Sherren Mocha, MD Electrophysiologist:  None   Chief Complaint  Patient presents with   Chest Pain    CP, Hx of CAD S/p 3 V CABG 2010    History of Present Illness: [name] is a 54 y.o. female with history of CAD, inferior MI PCI to RCA. CTO of LAD with multivessel CABG 2010. History of GERD, hypertension, hyperlipidemia.  Repeat cath in 2017 showed marked coronary artery disease with patent grafts to LAD and RCA.  SVG to diagonal occluded.  Previously complained of some shortness of breath with activity and some leg swelling improved with Lasix. Stress Myoview for chest pain 05/2018 showed no significant ischemia or scar. Low risk study.  History of tachycardia secondary to Graves' disease.  Recent  TSH (< 0.01), T4 (1.73) , T3 ( 3.2).  Beta-blocker was adjusted and heart rate was better controlled.   Blood pressure elevated on 02/02/2020 with symptoms of h/a, dizziness, nausea.  She was having mild chest discomfort. Losartan was increased to 100 mg daily.  Amlodipine 5 mg was started. She was advised via telephone to come in for annual follow up a little sooner per Dr Burt Knack.    She continues to complain of episodes of chest pain, neck pain, back pain, nausea, and diaphoresis with or without exertion for 2-3 weeks. She states these symptoms can become worse with exertion. She also has some atypical symptoms with throat discomfort. She mentions that she knows it has been ten years since her bypass surgery and is concerned one of her bypass grafts may be occluded. She states she is tired of feeling bad and wants to find the cause of her symptoms. She states theses symptoms feel similar to the symptoms she was having prior to the bypass surgery in 2010.   States her BP is much better since increasing the Losartan dosage and starting  Amlodipine. Having much less dizziness and headaches since that time.    Past Medical History:  Diagnosis Date   ACUT MI ANTEROLAT WALL SUBSQT EPIS CARE    Acute maxillary sinusitis    ALLERGIC RHINITIS, SEASONAL    ARTHRITIS, RHEUMATOID    shoulders and hands Enbrel>leg swelling Dr. Estanislado Pandy   Atrial fibrillation [facility])    a. after CABG.   Back pain    Bruxism (teeth grinding)    CAD, ARTERY BYPASS GRAFT    a. DES to RCA in 2010 then LAD occlusion s/p CABG 3 06/07/2009 with LIMA to LAD, reverse SVG to D1, reverse SVG to distal RCA. b. Cath 05/08/2016 slightly hypodense region in the intermediate branch, however she had excellent flow, FFR was normal. Vein graft to PDA and the posterolateral branch is patent, patent LIMA to LAD, occluded SVG to diagonal.   CARPAL TUNNEL SYNDROME, BILATERAL    CHOLELITHIASIS    Contrast media allergy    DERMATITIS, ALLERGIC    DIABETES MELLITUS, TYPE I    on insulin pump dx'ed age 4 y.o    DIABETIC  RETINOPATHY    Hiatal hernia    HYPERLIPIDEMIA-MIXED    HYPERTHYROIDISM    Dr. Dollene Cleveland   Insulin pump in place    MIGRAINE W/O AURA W/INTRACT W/STATUS MIGRAINOSUS 02/19/2008   Psoriasis    SINUS TACHYCARDIA 11/08/2010   SVT (supraventricular tachycardia) ([place])    after s/p CABG  TRIGGER FINGER    all fingers b/l hands    URI     Past Surgical History:  Procedure Laterality Date   ABDOMINAL HYSTERECTOMY     endometriomas b/l; total ? cervix removed; no h/o abnormal pap   Caesarean section     CARDIAC CATHETERIZATION N/A 05/08/2016   Procedure: Left Heart Cath and Cors/Grafts Angiography;  Surgeon: Burnell Blanks, MD;  Location: [place] [facility];  Service: Cardiovascular;  Laterality: N/A;   CARPAL TUNNEL RELEASE     b/l    CATARACT EXTRACTION, BILATERAL     CHOLECYSTECTOMY     CORONARY ARTERY BYPASS GRAFT     EYE SURGERY     laser x 2 for retinopathy    TRIGGER FINGER RELEASE     b/l fingers  all    VITRECTOMY     b/l     Current Outpatient Medications  Medication Sig Dispense Refill   amLODipine (NORVASC) 5 MG tablet Take 1 tablet (5 mg total) by mouth daily. 30 tablet 11   clopidogrel (PLAVIX) 75 MG tablet TAKE 1 TABLET BY MOUTH ONCE DAILY 90 tablet 2   cyclobenzaprine (FLEXERIL) 10 MG tablet Take 1 tablet (10 mg total) by mouth at bedtime. 30 tablet 0   Diclofenac Potassium (CAMBIA) 50 MG PACK Take 50 mg by mouth as needed. 12 each 11   Esomeprazole Magnesium (NEXIUM 24HR PO) Take 20 mg by mouth daily.      folic acid (FOLVITE) 1 MG tablet .AKE 2 TABLETS (2 MG TOTAL) BY MOUTH DAILY. 180 tablet 1   furosemide (LASIX) 40 MG tablet Take 40 mg by mouth daily as needed for edema (When she feels like she is swelling).     Golimumab ([place] ARIA IV) Inject into the vein.     insulin lispro (HUMALOG) 100 UNIT/ML injection FOR USE IN INSULIN PUMP. TOTAL DAILY INSULIN DOSE = UP TO 90 UNITS.     isosorbide mononitrate (IMDUR) 60 MG 24 hr tablet Take 1 tablet (60 mg total) by mouth daily. 30 tablet 11   leflunomide (ARAVA) 20 MG tablet Take 1 tablet (20 mg total) by mouth daily. 90 tablet 0   losartan (COZAAR) 100 MG tablet Take 1 tablet (100 mg total) by mouth daily. 30 tablet 11   methimazole (TAPAZOLE) 5 MG tablet 5 mg. Take 2 tablets by mouth in the morning and one tablet in the evening     metoprolol tartrate (LOPRESSOR) 50 MG tablet Take 1 tablet (50 mg total) by mouth 2 (two) times daily. 180 tablet 3   montelukast (SINGULAIR) 10 MG tablet TAKE 1 TABLET BY MOUTH AT BEDTIME 30 tablet 2   nabumetone (RELAFEN) 750 MG tablet Take 1 tablet (750 mg total) by mouth 2 (two) times daily. 60 tablet 0   Omega-3 Fatty Acids (FISH OIL) 1200 MG CAPS Take 1,200 mg by mouth 2 (two) times daily.     ondansetron (ZOFRAN ODT) 8 MG disintegrating tablet Take 1 tablet (8 mg total) by mouth 2 (two) times daily. 6 tablet 0   potassium chloride (K-DUR) 10 MEQ tablet Take 1 tablet (10  mEq total) by mouth daily. (Patient taking differently: 10 mEq daily as needed. ) 90 tablet 3   promethazine (PHENERGAN) 25 MG tablet Take 1 tablet (25 mg total) by mouth every 6 (six) hours as needed for nausea or vomiting. 20 tablet 0   EPINEPHrine 0.3 mg/0.3 mL IJ SOAJ injection Inject 0.3 mLs (0.3 mg total) into the muscle  as needed for anaphylaxis. 1 Device prn   nitroGLYCERIN (NITROSTAT) 0.4 MG SL tablet Place 1 tablet (0.4 mg total) under the tongue every 5 (five) minutes as needed. 25 tablet 3   ONE TOUCH ULTRA TEST test strip   1   pantoprazole (PROTONIX) 40 MG tablet Take 1 tablet (40 mg total) by mouth daily. 30 tablet 11   predniSONE (DELTASONE) 50 MG tablet TAKE 1 TABLET BY MOUTH 13 HOURS PRIOR TO YOUR PROCEDURE, TAKE 1 TABLE BY MOUTH 7 HOURS PRIOR TO YOUR PROCEDURE, TAKE 1 TABLET BY MOUTH AS YOU ARE LEAVING HOME FOR YOUR PROCEDURE ALONG WITH A BENADRYL 50 MG TABLET 3 tablet 0   rosuvastatin (CRESTOR) 20 MG tablet Take 1 tablet (20 mg total) by mouth daily. 90 tablet 3   Current Facility-Administered Medications  Medication Dose Route Frequency Provider Last Rate Last Admin   sodium chloride flush (NS) 0.9 % injection 3 mL  3 mL Intravenous Q12H Verta Ellen., NP       Allergies:  Prochlorperazine, Ramipril, Shellfish-derived products, Atorvastatin, Compazine  [prochlorperazine edisylate], Etanercept, Infliximab, Iohexol, Orencia [abatacept], Shellfish allergy, Tofacitinib, Actemra [tocilizumab], Emgality [galcanezumab-gnlm], Prochlorperazine edisylate, Trokendi xr [topiramate er], Amiodarone, and Rituximab   Social History: The patient  reports that she has never smoked. She has never used smokeless tobacco. She reports current alcohol use. She reports that she does not use drugs.   Family History: The patient's family history includes Anxiety disorder in her mother; Colon polyps in her father; Depression in her mother and another family member; Diabetes in her father and  another family member; Heart disease in an other family member; Hyperlipidemia in an other family member; Hypertension in her father and another family member; Migraines in an other family member; Parkinson's disease in her father; Stroke in her paternal grandmother.   ROS:  Please see the history of present illness. Otherwise, complete review of systems is positive for none.  All other systems are reviewed and negative.   Physical Exam: VS:  BP 120/68    Pulse 61    Ht 5\' 3"  (1.6 m)    Wt 168 lb (76.2 kg)    BMI 29.76 kg/m , BMI Body mass index is 29.76 kg/m.  Wt Readings from Last 3 Encounters:  02/15/20 168 lb (76.2 kg)  02/07/20 168 lb 12.8 oz (76.6 kg)  01/13/20 166 lb 6.4 oz (75.5 kg)    General: Patient appears comfortable at rest. HEENT: Conjunctiva and lids normal, oropharynx clear with moist mucosa. Neck: Supple, no elevated JVP or carotid bruits, no thyromegaly. Lungs: Clear to auscultation, nonlabored breathing at rest. Cardiac: Regular rate and rhythm, no S3 or significant systolic murmur, no pericardial rub. Abdomen: Soft, nontender, no hepatomegaly, bowel sounds present, no guarding or rebound. Extremities: No pitting edema, distal pulses 2+. Skin: Warm and dry. Musculoskeletal: No kyphosis. Neuropsychiatric: Alert and oriented x3, affect grossly appropriate.  ECG:    Recent Labwork: 11/17/2019: ALT 21; AST 24; BUN 11; Creat 0.98; Hemoglobin 13.1; Platelets 151; Potassium 4.4; Sodium 139 01/04/2020: TSH <0.01 Repeated and verified X2.     Component Value Date/Time   CHOL 166 01/04/2020 0937   TRIG 151.0 (H) 01/04/2020 0937   HDL 78.10 01/04/2020 0937   CHOLHDL 2 01/04/2020 0937   VLDL 30.2 01/04/2020 0937   LDLCALC 58 01/04/2020 0937    Other Studies Reviewed Today:   Echocardiogram March 11, 2019 IMPRESSIONS  1. The left ventricle has normal systolic function with an ejection fraction  of 60-65%. The cavity size was normal. Left ventricular diastolic Doppler  parameters are consistent with impaired relaxation. Indeterminate filling pressures.  2. The right ventricle has normal systolic function. The cavity was normal. There is no increase in right ventricular wall thickness.    Lexiscan Myoview stress test 05/2018  Probable normal perfusion and soft tissue attenuation Diaphragm No significant ischemia or scar  Nuclear stress EF: 62%.  This is a low risk study.   Cardiac catheterization May 08, 2016 1. Severe double vessel CAD s/p 3V CABG 2. The LAD has diffuse severe proximal stenosis. There is both antegrade flow down the LAD and retrograde filling from the patent LIMA graft. The Diagonal that was supplied by the vein graft is small in caliber. The vein graft to the diagonal is occluded.  3. The Circumflex is small in caliber and has mild plaque disease. The intermediate branch is large in caliber. The intermediate branch has a proximal 30% stenosis, slightly hypodense in appearance but with excellent flow down the vessel. (FFR of this vessel is 0.91 with infusion of adenosine suggesting no significant flow obstruction). It is unclear if this represents a non-flow limiting plaque rupture or hypodense calcific lesion.  4. The RCA is previously stented in the mid segment. The mid stented segment is completely occluded. The vein graft to the PDA and posterolateral branches is patent.   Recommendations: She presented with continuous chest pain for 48 hours with no objective evidence of ischemia (negative troponin, no ischemic EKG changes). No culprit lesion is noted. As above, the mildly hypodense plaque in the intermediate branch could represent plaque rupture without flow obstruction but FFR is in a normal range. I do not think this lesion is causing her chest pain given the excellent flow down the vessel. I will treat with ASA, Plavix in case of plaque rupture and will start Imdur. Consider GI related cause of her chest pain  Assessment and  Plan:  1. Coronary artery disease involving native coronary artery of native heart with angina pectoris ([place])   2. Essential hypertension   3. Hyperlipidemia LDL goal <70   4. Type 1 diabetes mellitus with other circulatory complication (HCC)    1. Coronary artery disease involving native coronary artery of native heart with angina pectoris [facility]) Discussed patient's symptoms with primary cardiologist Dr Burt Knack. He agrees patient needs cardiac catheterization. Schedule for outpatient left heart cath On Monday Feb 22nd Dr. Angelena Form secondary to c/o chest pain ,nausea, neck pain, diaphoresis, with and without exertion. We offered a sooner date for end of this week but she wished to defer until next week. Get CBC BMET today. Hx of CABG 2010 3 vessel. Cath 2017  mildly hypodense plaque in the intermediate branch could represent plaque rupture without flow obstruction but FFR is in a normal range. Advised patient to start back on daily ASA 81 mg daily  today. Change Nexium to pantoprazole 40 mg d/t Plavix therapy. Discussed cardiac catheterization risks and benefits with patient and she verbalizes understanding and consents to catheterization. Patient has a contrast dye allergy and will be pre-treated and post treated with prednisone and benadryl.   2. Essential hypertension BP 128 68 today after Losartan increased to 100mg  daily and Amlodipine 5 mg daily started. Continue Losartan and Amlodipine.  3. Hyperlipidemia LDL goal <70 Last LDL 58. 01/04/2020 Continue  Rosuvastatin 20 mg dialy   4. Type 1 diabetes mellitus with other circulatory complication [facility]) Patient uses an insulin pump. Last Hgb A1c 7%.  Patient  managed by endocrinology.  Medication Adjustments/Labs and Tests Ordered: Current medicines are reviewed at length with the patient today.  Concerns regarding medicines are outlined above.    Patient Instructions  Medication Instructions:  Your physician has recommended you make the  following change in your medication:  1.  RESTART Aspirin 81 mg take 1 tablet daily 2.  STOP Nexium 3.  START Protonix 40 mg taking 1 tablet daily   *If you need a refill on your cardiac medications before your next appointment, please call your pharmacy*  Lab Work: TODAY:  BMET & CBC  Friday 02/19/2020:  [place], [place] TEST AT 11:30   If you have labs (blood work) drawn today and your tests are completely normal, you will receive your results only by:  MyChart Message (if you have MyChart) OR  A paper copy in the mail If you have any lab test that is abnormal or we need to change your treatment, we will call you to review the results.  Testing/Procedures:    [place] OFFICE [place], [place] [address] Dept: [phone] Loc: [phone]  [name]  02/15/2020  You are scheduled for a Cardiac Catheterization on Monday, February 22 with Dr. [name].  1. Please arrive at the [facility] (Main Entrance A) at [facility]: [address] [place], [place] [address] at 5:30 AM (This time is two hours before your procedure to ensure your preparation). Free valet parking service is available.   Special note: Every effort is made to have your procedure done on time. Please understand that emergencies sometimes delay scheduled procedures.  2. Diet: Do not eat solid foods after midnight.  The patient may have clear liquids until 5am upon the day of the procedure.  3. Labs: You will need to have blood drawn on TODAY 4. Medication instructions in preparation for your procedure:   Contrast Allergy: Yes, Please take Prednisone 50mg  by mouth at: Thirteen hours prior to cath 6:00am on Sunday Seven hours prior to cath 12:00am on Monday And prior to leaving home please take last dose of Prednisone 50mg  and  Benadryl 50mg  by mouth.   Stop taking, Lasix (Furosemide)  Monday, February 22,  WE WILL CALL YOU REGARDING YOUR INSULIN DOSAGE WITH THE INSULIN PUMP  On the morning of your procedure, take your Aspirin and any morning medicines NOT listed above.  You may use sips of water.  5. Plan for one night stay--bring personal belongings. 6. Bring a current list of your medications and current insurance cards. 7. You MUST have a responsible person to drive you home. 8. Someone MUST be with you the first 24 hours after you arrive home or your discharge will be delayed. 9. Please wear clothes that are easy to get on and off and wear slip-on shoes.  Thank you for allowing [place] to care for you!   -- [place] [facility]    Follow-Up: At [facility], you and your health needs are our priority.  As part of our continuing mission to provide you with exceptional heart care, we have created designated Provider Care Teams.  These Care Teams include your primary Cardiologist (physician) and Advanced Practice Providers (APPs -  Physician Assistants and Nurse Practitioners) who all work together to provide you with the care you need, when you need it.  Your next appointment:   03/09/2020 ARRIVE AT 11:30 FOR REGISTRATION  The format for your next appointment:   In Person  Provider:   You may see Sherren Mocha, MD or one of the following Advanced Practice Providers on your designated Care Team:    Richardson Dopp, PA-C  [place], PA-C  Daune Perch, NP   Signed, Levell July, NP 02/15/2020 8:55 PM    [place]

## 2020-02-15 ENCOUNTER — Ambulatory Visit: Payer: 59 | Admitting: Family Medicine

## 2020-02-15 ENCOUNTER — Other Ambulatory Visit: Payer: Self-pay | Admitting: Physician Assistant

## 2020-02-15 ENCOUNTER — Encounter: Payer: Self-pay | Admitting: Physician Assistant

## 2020-02-15 ENCOUNTER — Other Ambulatory Visit: Payer: Self-pay | Admitting: Rheumatology

## 2020-02-15 ENCOUNTER — Other Ambulatory Visit: Payer: Self-pay

## 2020-02-15 VITALS — BP 120/68 | HR 61 | Ht 63.0 in | Wt 168.0 lb

## 2020-02-15 DIAGNOSIS — E785 Hyperlipidemia, unspecified: Secondary | ICD-10-CM

## 2020-02-15 DIAGNOSIS — I25119 Atherosclerotic heart disease of native coronary artery with unspecified angina pectoris: Secondary | ICD-10-CM

## 2020-02-15 DIAGNOSIS — E1059 Type 1 diabetes mellitus with other circulatory complications: Secondary | ICD-10-CM

## 2020-02-15 DIAGNOSIS — I10 Essential (primary) hypertension: Secondary | ICD-10-CM | POA: Diagnosis not present

## 2020-02-15 MED ORDER — OMEPRAZOLE 20 MG PO CPDR: 40.0000 mg | DELAYED_RELEASE_CAPSULE | Freq: Every day | ORAL | 11 refills | Status: DC

## 2020-02-15 MED ORDER — SODIUM CHLORIDE 0.9% FLUSH: 3.0000 mL | Freq: Two times a day (BID) | INTRAVENOUS | Status: DC

## 2020-02-15 MED ORDER — NITROGLYCERIN 0.4 MG SL SUBL: 0.4000 mg | SUBLINGUAL_TABLET | SUBLINGUAL | 3 refills | Status: DC | PRN

## 2020-02-15 MED ORDER — PANTOPRAZOLE SODIUM 40 MG PO TBEC: 40.0000 mg | DELAYED_RELEASE_TABLET | Freq: Every day | ORAL | 11 refills | Status: DC

## 2020-02-15 MED ORDER — CYCLOBENZAPRINE HCL 10 MG PO TABS: 10.0000 mg | ORAL_TABLET | Freq: Every day | ORAL | 0 refills | Status: DC

## 2020-02-15 MED ORDER — OMEPRAZOLE 40 MG PO CPDR: 40.0000 mg | DELAYED_RELEASE_CAPSULE | Freq: Every day | ORAL | 1 refills | Status: DC

## 2020-02-15 MED ORDER — PREDNISONE 50 MG PO TABS: ORAL_TABLET | ORAL | 0 refills | Status: DC

## 2020-02-15 NOTE — Patient Instructions (Signed)
Medication Instructions:  Your physician has recommended you make the following change in your medication:  1.  RESTART Aspirin 81 mg take 1 tablet daily 2.  STOP Nexium 3.  START Protonix 40 mg taking 1 tablet daily   *If you need a refill on your cardiac medications before your next appointment, please call your pharmacy*  Lab Work: TODAY:  BMET & CBC  Friday 02/19/2020:  [address], [place] FOR YOUR COVID TEST AT 11:30   If you have labs (blood work) drawn today and your tests are completely normal, you will receive your results only by: Marland Kitchen MyChart Message (if you have MyChart) OR . A paper copy in the mail If you have any lab test that is abnormal or we need to change your treatment, we will call you to review the results.  Testing/Procedures:    [facility] OFFICE [place], [place] [address] Dept: [phone] Loc: [phone]  [name]  02/15/2020  You are scheduled for a Cardiac Catheterization on Monday, February 22 with Dr. [name].  1. Please arrive at the [facility] (Main Entrance A) at [facility]: [address] [place], [place] [address] at 5:30 AM (This time is two hours before your procedure to ensure your preparation). Free valet parking service is available.   Special note: Every effort is made to have your procedure done on time. Please understand that emergencies sometimes delay scheduled procedures.  2. Diet: Do not eat solid foods after midnight.  The patient may have clear liquids until 5am upon the day of the procedure.  3. Labs: You will need to have blood drawn on TODAY 4. Medication instructions in preparation for your procedure:   Contrast Allergy: Yes, Please take Prednisone 50mg  by mouth at: Thirteen hours prior to cath 6:00am on Sunday Seven hours prior to cath 12:00am on  Monday And prior to leaving home please take last dose of Prednisone 50mg  and Benadryl 50mg  by mouth.   Stop taking, Lasix (Furosemide)  Monday, February 22,  WE WILL CALL YOU REGARDING YOUR INSULIN DOSAGE WITH THE INSULIN PUMP  On the morning of your procedure, take your Aspirin and any morning medicines NOT listed above.  You may use sips of water.  5. Plan for one night stay--bring personal belongings. 6. Bring a current list of your medications and current insurance cards. 7. You MUST have a responsible person to drive you home. 8. Someone MUST be with you the first 24 hours after you arrive home or your discharge will be delayed. 9. Please wear clothes that are easy to get on and off and wear slip-on shoes.  Thank you for allowing [place] to care for you!   -- [facility]    Follow-Up: At [facility], you and your health needs are our priority.  As part of our continuing mission to provide you with exceptional heart care, we have created designated Provider Care Teams.  These Care Teams include your primary Cardiologist (physician) and Advanced Practice Providers (APPs -  Physician Assistants and Nurse Practitioners) who all work together to provide you with the care you need, when you need it.  Your next appointment:   03/09/2020 ARRIVE AT 11:30 FOR REGISTRATION  The format for your next appointment:   In Person  Provider:   You may see Sherren Mocha, MD or one of  the following Advanced Practice Providers on your designated Care Team:    Richardson Dopp, PA-C  [place], [place]  Daune Perch, NP   Other Instructions  Cardiac Nuclear Scan A cardiac nuclear scan is a test that is done to check the flow of blood to your heart. It is done when you are resting and when you are exercising. The test looks for problems such as:  Not enough blood reaching a portion of the heart.  The heart muscle not working as it should. You may need this  test if:  You have heart disease.  You have had lab results that are not normal.  You have had heart surgery or a balloon procedure to open up blocked arteries (angioplasty).  You have chest pain.  You have shortness of breath. In this test, a special dye (tracer) is put into your bloodstream. The tracer will travel to your heart. A camera will then take pictures of your heart to see how the tracer moves through your heart. This test is usually done at a hospital and takes 2-4 hours. Tell a doctor about:  Any allergies you have.  All medicines you are taking, including vitamins, herbs, eye drops, creams, and over-the-counter medicines.  Any problems you or family members have had with anesthetic medicines.  Any blood disorders you have.  Any surgeries you have had.  Any medical conditions you have.  Whether you are pregnant or may be pregnant. What are the risks? Generally, this is a safe test. However, problems may occur, such as:  Serious chest pain and heart attack. This is only a risk if the stress portion of the test is done.  Rapid heartbeat.  A feeling of warmth in your chest. This feeling usually does not last long.  Allergic reaction to the tracer. What happens before the test?  Ask your doctor about changing or stopping your normal medicines. This is important.  Follow instructions from your doctor about what you cannot eat or drink.  Remove your jewelry on the day of the test. What happens during the test?  An IV tube will be inserted into one of your veins.  Your doctor will give you a small amount of tracer through the IV tube.  You will wait for 20-40 minutes while the tracer moves through your bloodstream.  Your heart will be monitored with an electrocardiogram (ECG).  You will lie down on an exam table.  Pictures of your heart will be taken for about 15-20 minutes.  You may also have a stress test. For this test, one of these things may be  done: ? You will be asked to exercise on a treadmill or a stationary bike. ? You will be given medicines that will make your heart work harder. This is done if you are unable to exercise.  When blood flow to your heart has peaked, a tracer will again be given through the IV tube.  After 20-40 minutes, you will get back on the exam table. More pictures will be taken of your heart.  Depending on the tracer that is used, more pictures may need to be taken 3-4 hours later.  Your IV tube will be removed when the test is over. The test may vary among doctors and hospitals. What happens after the test?  Ask your doctor: ? Whether you can return to your normal schedule, including diet, activities, and medicines. ? Whether you should drink more fluids. This will help to remove the tracer from  your body. Drink enough fluid to keep your pee (urine) pale yellow.  Ask your doctor, or the department that is doing the test: ? When will my results be ready? ? How will I get my results? Summary  A cardiac nuclear scan is a test that is done to check the flow of blood to your heart.  Tell your doctor whether you are pregnant or may be pregnant.  Before the test, ask your doctor about changing or stopping your normal medicines. This is important.  Ask your doctor whether you can return to your normal activities. You may be asked to drink more fluids. This information is not intended to replace advice given to you by your health care provider. Make sure you discuss any questions you have with your health care provider. Document Revised: 04/08/2019 Document Reviewed: 06/02/2018 Elsevier Patient Education  [employer].

## 2020-02-15 NOTE — Telephone Encounter (Signed)
Last Visit: 01/06/2020 Next Visit: 04/08/2020  Okay to refill per Dr. Estanislado Pandy.

## 2020-02-16 ENCOUNTER — Ambulatory Visit: Payer: 59 | Admitting: Internal Medicine

## 2020-02-16 ENCOUNTER — Encounter: Payer: Self-pay | Admitting: Internal Medicine

## 2020-02-16 VITALS — BP 124/78 | HR 64 | Temp 97.3°F | Ht 63.0 in | Wt 169.0 lb

## 2020-02-16 DIAGNOSIS — M549 Dorsalgia, unspecified: Secondary | ICD-10-CM | POA: Diagnosis not present

## 2020-02-16 DIAGNOSIS — M545 Low back pain, unspecified: Secondary | ICD-10-CM

## 2020-02-16 DIAGNOSIS — R1084 Generalized abdominal pain: Secondary | ICD-10-CM

## 2020-02-16 DIAGNOSIS — I10 Essential (primary) hypertension: Secondary | ICD-10-CM | POA: Diagnosis not present

## 2020-02-16 DIAGNOSIS — R5383 Other fatigue: Secondary | ICD-10-CM

## 2020-02-16 DIAGNOSIS — R1011 Right upper quadrant pain: Secondary | ICD-10-CM | POA: Diagnosis not present

## 2020-02-16 DIAGNOSIS — R1031 Right lower quadrant pain: Secondary | ICD-10-CM

## 2020-02-16 LAB — BASIC METABOLIC PANEL
BUN/Creatinine Ratio: 11 (ref 9–23)
BUN: 9 mg/dL (ref 6–24)
CO2: 21 mmol/L (ref 20–29)
Calcium: 9.3 mg/dL (ref 8.7–10.2)
Chloride: 103 mmol/L (ref 96–106)
Creatinine, Ser: 0.8 mg/dL (ref 0.57–1.00)
GFR calc Af Amer: 98 mL/min/{1.73_m2} (ref >59)
GFR calc non Af Amer: 85 mL/min/{1.73_m2} (ref >59)
Glucose: 224 mg/dL — ABNORMAL HIGH (ref 65–99)
Potassium: 4.4 mmol/L (ref 3.5–5.2)
Sodium: 140 mmol/L (ref 134–144)

## 2020-02-16 LAB — CBC
Hematocrit: 38.9 % (ref 34.0–46.6)
Hemoglobin: 13.4 g/dL (ref 11.1–15.9)
MCH: 30 pg (ref 26.6–33.0)
MCHC: 34.4 g/dL (ref 31.5–35.7)
MCV: 87 fL (ref 79–97)
Platelets: 172 10*3/uL (ref 150–450)
RBC: 4.47 x10E6/uL (ref 3.77–5.28)
RDW: 12.8 % (ref 11.7–15.4)
WBC: 4.9 10*3/uL (ref 3.4–10.8)

## 2020-02-16 NOTE — Progress Notes (Addendum)
Chief Complaint  Patient presents with  . Nephrolithiasis   ED/Urgent care f/u  1. She has not been feeling well having fatigue, chest pain, left shoulder rad to throat 5/10 intermittent pressure cold and sweaty, nauseated heart history s/p CBP and cath sch 02/22/20. Given zofran and phenerghan which help nausea 2. C/o low back pain 5/10 and RUQ and RLQ ab pain she has a h/o colitis and kidney stones  She is also having mid and low back pain. Review of prev imaging with T11/12 DDD end plate changes T spine and prev abdominal imaging 05/22/17 punctate intrarenal kidney stones  3. HTN was uncontrolled so effexor was stopped and now on norvasc 5 and losartan 100 mg qd, Lopressor 50 bid, lasix 40 mg qd, imdur 60 qd and BP 124/78 today  4. Abnormal thyroid function tests and DM 1 appt with endocrine 02/19/20 given copy of labs  Review of Systems  Constitutional: Positive for malaise/fatigue.  Cardiovascular: Positive for chest pain.  Gastrointestinal: Positive for abdominal pain and nausea. Negative for blood in stool.  Musculoskeletal: Positive for back pain.   Past Medical History:  Diagnosis Date  . ACUT MI ANTEROLAT WALL SUBSQT EPIS CARE   . Acute maxillary sinusitis   . ALLERGIC RHINITIS, SEASONAL   . ARTHRITIS, RHEUMATOID    shoulders and hands Enbrel>leg swelling Dr. Estanislado Pandy  . Atrial fibrillation ([place])    a. after CABG.  . Back pain   . Bruxism (teeth grinding)   . CAD, ARTERY BYPASS GRAFT    a. DES to RCA in 2010 then LAD occlusion s/p CABG 3 06/07/2009 with LIMA to LAD, reverse SVG to D1, reverse SVG to distal RCA. b. Cath 05/08/2016 slightly hypodense region in the intermediate branch, however she had excellent flow, FFR was normal. Vein graft to PDA and the posterolateral branch is patent, patent LIMA to LAD, occluded SVG to diagonal.  . CARPAL TUNNEL SYNDROME, BILATERAL   . CHOLELITHIASIS   . Contrast media allergy   . DERMATITIS, ALLERGIC   . DIABETES MELLITUS, TYPE I    on  insulin pump dx'ed age 54 y.o   . DIABETIC  RETINOPATHY   . Hiatal hernia   . HYPERLIPIDEMIA-MIXED   . HYPERTHYROIDISM    Dr. Dollene Cleveland  . Insulin pump in place   . MIGRAINE W/O AURA W/INTRACT W/STATUS MIGRAINOSUS 02/19/2008  . Psoriasis   . SINUS TACHYCARDIA 11/08/2010  . SVT (supraventricular tachycardia) (H[facility])    after s/p CABG  . TRIGGER FINGER    all fingers b/l hands   . URI    Past Surgical History:  Procedure Laterality Date  . ABDOMINAL HYSTERECTOMY     endometriomas b/l; total ? cervix removed; no h/o abnormal pap  . Caesarean section    . CARDIAC CATHETERIZATION N/A 05/08/2016   Procedure: Left Heart Cath and Cors/Grafts Angiography;  Surgeon: Burnell Blanks, MD;  Location: [facility];  Service: Cardiovascular;  Laterality: N/A;  . CARPAL TUNNEL RELEASE     b/l   . CATARACT EXTRACTION, BILATERAL    . CHOLECYSTECTOMY    . CORONARY ARTERY BYPASS GRAFT    . EYE SURGERY     laser x 2 for retinopathy   . TRIGGER FINGER RELEASE     b/l fingers all   . VITRECTOMY     b/l    Family History  Problem Relation Age of Onset  . Depression Mother   . Anxiety disorder Mother   . Colon polyps  Father   . Diabetes Father        2  . Hypertension Father   . Parkinson's disease Father   . Diabetes Other   . Heart disease Other   . Hypertension Other   . Hyperlipidemia Other   . Depression Other   . Migraines Other   . Stroke Paternal Grandmother   . Heart attack Neg Hx   . Colon cancer Neg Hx   . Stomach cancer Neg Hx   . Esophageal cancer Neg Hx    Social History   Socioeconomic History  . Marital status: Divorced    Spouse name: Not on file  . Number of children: Not on file  . Years of education: Not on file  . Highest education level: Not on file  Occupational History  . Not on file  Tobacco Use  . Smoking status: Never Smoker  . Smokeless tobacco: Never Used  Substance and Sexual Activity  . Alcohol use: Yes    Comment: rarely 1  every 6 months  . Drug use: No  . Sexual activity: Not Currently    Partners: Male    Birth control/protection: None  Other Topics Concern  . Not on file  Social History Narrative   Divorced. Has 2 kids(fraternal twins) daughters age 30. Works at [employer], Never smoked, denies ETOH, no drugs. Drinks diet coke. No exercise.       DPR daughters [name] and Anaiza Azbill twins          Social Determinants of Health   Financial Resource Strain:   . Difficulty of Paying Living Expenses: Not on file  Food Insecurity:   . Worried About Charity fundraiser in the Last Year: Not on file  . Ran Out of Food in the Last Year: Not on file  Transportation Needs:   . Lack of Transportation (Medical): Not on file  . Lack of Transportation (Non-Medical): Not on file  Physical Activity:   . Days of Exercise per Week: Not on file  . Minutes of Exercise per Session: Not on file  Stress:   . Feeling of Stress : Not on file  Social Connections:   . Frequency of Communication with Friends and Family: Not on file  . Frequency of Social Gatherings with Friends and Family: Not on file  . Attends Religious Services: Not on file  . Active Member of Clubs or Organizations: Not on file  . Attends Archivist Meetings: Not on file  . Marital Status: Not on file  Intimate Partner Violence:   . Fear of Current or Ex-Partner: Not on file  . Emotionally Abused: Not on file  . Physically Abused: Not on file  . Sexually Abused: Not on file   Current Meds  Medication Sig  . amLODipine (NORVASC) 5 MG tablet Take 1 tablet (5 mg total) by mouth daily.  . clopidogrel (PLAVIX) 75 MG tablet TAKE 1 TABLET BY MOUTH ONCE DAILY (Patient taking differently: Take 75 mg by mouth daily. )  . cyclobenzaprine (FLEXERIL) 10 MG tablet Take 1 tablet (10 mg total) by mouth at bedtime.  . Diclofenac Potassium (CAMBIA) 50 MG PACK Take 50 mg by mouth as needed. (Patient not taking: Reported on 02/16/2020)  .  EPINEPHrine 0.3 mg/0.3 mL IJ SOAJ injection Inject 0.3 mLs (0.3 mg total) into the muscle as needed for anaphylaxis.  . folic acid (FOLVITE) 1 MG tablet .AKE 2 TABLETS (2 MG TOTAL) BY MOUTH DAILY. (Patient taking differently:  Take 2 mg by mouth daily. )  . furosemide (LASIX) 40 MG tablet Take 40 mg by mouth daily as needed for edema.   . Golimumab (Manata ARIA IV) Inject into the vein.  Marland Kitchen insulin lispro (HUMALOG) 100 UNIT/ML injection Inject 80-90 Units into the skin continuous. FOR USE IN INSULIN PUMP. TOTAL DAILY INSULIN DOSE = UP TO 90 UNITS.  Marland Kitchen isosorbide mononitrate (IMDUR) 60 MG 24 hr tablet Take 1 tablet (60 mg total) by mouth daily.  Marland Kitchen leflunomide (ARAVA) 20 MG tablet Take 1 tablet (20 mg total) by mouth daily.  Marland Kitchen losartan (COZAAR) 100 MG tablet Take 1 tablet (100 mg total) by mouth daily.  . methimazole (TAPAZOLE) 10 MG tablet Take 5-10 mg by mouth See admin instructions. Take 10 mg by mouth in the morning and 5 mg in the afternoon  . metoprolol tartrate (LOPRESSOR) 50 MG tablet Take 1 tablet (50 mg total) by mouth 2 (two) times daily.  . montelukast (SINGULAIR) 10 MG tablet TAKE 1 TABLET BY MOUTH AT BEDTIME (Patient taking differently: Take 10 mg by mouth at bedtime. )  . nabumetone (RELAFEN) 750 MG tablet Take 1 tablet (750 mg total) by mouth 2 (two) times daily.  . nitroGLYCERIN (NITROSTAT) 0.4 MG SL tablet Place 1 tablet (0.4 mg total) under the tongue every 5 (five) minutes as needed.  . ondansetron (ZOFRAN ODT) 8 MG disintegrating tablet Take 1 tablet (8 mg total) by mouth 2 (two) times daily.  . ONE TOUCH ULTRA TEST test strip   . pantoprazole (PROTONIX) 40 MG tablet Take 1 tablet (40 mg total) by mouth daily.  . potassium chloride (K-DUR) 10 MEQ tablet Take 1 tablet (10 mEq total) by mouth daily. (Patient taking differently: Take 10 mEq by mouth daily as needed (when taking furosemide). )  . predniSONE (DELTASONE) 50 MG tablet TAKE 1 TABLET BY MOUTH 13 HOURS PRIOR TO YOUR  PROCEDURE, TAKE 1 TABLE BY MOUTH 7 HOURS PRIOR TO YOUR PROCEDURE, TAKE 1 TABLET BY MOUTH AS YOU ARE LEAVING HOME FOR YOUR PROCEDURE ALONG WITH A BENADRYL 50 MG TABLET  . promethazine (PHENERGAN) 25 MG tablet Take 1 tablet (25 mg total) by mouth every 6 (six) hours as needed for nausea or vomiting.  . rosuvastatin (CRESTOR) 20 MG tablet Take 1 tablet (20 mg total) by mouth daily.  . [DISCONTINUED] Omega-3 Fatty Acids (FISH OIL) 1200 MG CAPS Take 1,200 mg by mouth 2 (two) times daily.   Current Facility-Administered Medications for the 02/16/20 encounter (Office Visit) with McLean-Scocuzza, Nino Glow, MD  Medication  . sodium chloride flush (NS) 0.9 % injection 3 mL   Allergies  Allergen Reactions  . Prochlorperazine Rash    Neuro problems per pt  . Ramipril Swelling, Rash and Other (See Comments)  . Shellfish-Derived Products Swelling    Shrimp   . Atorvastatin Rash    Elevated LFT's  . Compazine  [Prochlorperazine Edisylate] Other (See Comments)    Neurological reaction  . Etanercept Swelling and Rash  . Infliximab Rash  . Iohexol     Iv contrast dye -rash all over  . Orencia [Abatacept] Rash  . Shellfish Allergy Swelling    Shrimp(Facial swelling)  . Tofacitinib Rash and Other (See Comments)    Severe abdominal pain   . Actemra [Tocilizumab]   . Emgality [Galcanezumab-Gnlm] Hives and Swelling  . Prochlorperazine Edisylate     unknown  . [employer]]     [employer], memory issues, word finding issues  .  Amiodarone Nausea Only  . Rituximab Rash    Causes a rash   Recent Results (from the past 2160 hour(s))  Vitamin D (25 hydroxy)     Status: Abnormal   Collection Time: 01/04/20  9:37 AM  Result Value Ref Range   VITD 26.29 (L) 30.00 - 100.00 ng/mL  Thyroid peroxidase antibody     Status: Abnormal   Collection Time: 01/04/20  9:37 AM  Result Value Ref Range   Thyroperoxidase Ab SerPl-aCnc 284 (H) <9 IU/mL  HgB A1c     Status: Abnormal   Collection Time:  01/04/20  9:37 AM  Result Value Ref Range   Hgb A1c MFr Bld 7.0 (H) 4.6 - 6.5 %    Comment: Glycemic Control Guidelines for People with Diabetes:Non Diabetic:  <6%Goal of Therapy: <7%Additional Action Suggested:  >8%   T3, free     Status: None   Collection Time: 01/04/20  9:37 AM  Result Value Ref Range   T3, Free 3.2 2.3 - 4.2 pg/mL  T4, free     Status: Abnormal   Collection Time: 01/04/20  9:37 AM  Result Value Ref Range   Free T4 1.73 (H) 0.60 - 1.60 ng/dL    Comment: Specimens from patients who are undergoing biotin therapy and /or ingesting biotin supplements may contain high levels of biotin.  The higher biotin concentration in these specimens interferes with this Free T4 assay.  Specimens that contain high levels  of biotin may cause false high results for this Free T4 assay.  Please interpret results in light of the total clinical presentation of the patient.    TSH     Status: Abnormal   Collection Time: 01/04/20  9:37 AM  Result Value Ref Range   TSH <0.01 Repeated and verified X2. (L) 0.35 - 4.50 uIU/mL  Lipid panel     Status: Abnormal   Collection Time: 01/04/20  9:37 AM  Result Value Ref Range   Cholesterol 166 0 - 200 mg/dL    Comment: ATP III Classification       Desirable:  < 200 mg/dL               Borderline High:  200 - 239 mg/dL          High:  > = 240 mg/dL   Triglycerides 151.0 (H) 0.0 - 149.0 mg/dL    Comment: Normal:  <150 mg/dLBorderline High:  150 - 199 mg/dL   HDL 78.10 >39.00 mg/dL   VLDL 30.2 0.0 - 40.0 mg/dL   LDL Cholesterol 58 0 - 99 mg/dL   Total CHOL/HDL Ratio 2     Comment:                Men          Women1/2 Average Risk     3.4          3.3Average Risk          5.0          4.42X Average Risk          9.6          7.13X Average Risk          15.0          11.0                       NonHDL 87.77     Comment: NOTE:  Non-HDL goal should be 30 mg/dL higher  than patient's LDL goal (i.e. LDL goal of < 70 mg/dL, would have non-HDL goal of < 100 mg/dL)   HM DIABETES EYE EXAM     Status: None   Collection Time: 01/06/20  3:52 PM  Result Value Ref Range   HM Diabetic Eye Exam    Urinalysis, Complete w Microscopic     Status: Abnormal   Collection Time: 02/07/20 10:05 AM  Result Value Ref Range   Color, Urine YELLOW YELLOW   APPearance CLEAR CLEAR   Specific Gravity, Urine <1.005 (L) 1.005 - 1.030   pH 5.5 5.0 - 8.0   Glucose, UA NEGATIVE NEGATIVE mg/dL   Hgb urine dipstick NEGATIVE NEGATIVE   Bilirubin Urine NEGATIVE NEGATIVE   Ketones, ur NEGATIVE NEGATIVE mg/dL   Protein, ur NEGATIVE NEGATIVE mg/dL   Nitrite NEGATIVE NEGATIVE   Leukocytes,Ua NEGATIVE NEGATIVE   Squamous Epithelial / LPF 0-5 0 - 5   WBC, UA 0-5 0 - 5 WBC/hpf   RBC / HPF NONE SEEN 0 - 5 RBC/hpf   Bacteria, UA FEW (A) NONE SEEN    Comment: Performed at [facility], [address], [place], [place] [address]  Novel Coronavirus, NAA (Hosp order, Send-out to Ref Lab; TAT 18-24 hrs     Status: None   Collection Time: 02/07/20 10:13 AM   Specimen: Nasopharyngeal Swab; Respiratory  Result Value Ref Range   SARS-CoV-2, NAA NOT DETECTED NOT DETECTED    Comment: (NOTE) This nucleic acid amplification test was developed and its performance characteristics determined by [employer]. Nucleic acid amplification tests include RT-PCR and TMA. This test has not been FDA cleared or approved. This test has been authorized by FDA under an Emergency Use Authorization (EUA). This test is only authorized for the duration of time the declaration that circumstances exist justifying the authorization of the emergency use of in vitro diagnostic tests for detection of SARS-CoV-2 virus and/or diagnosis of COVID-19 infection under section 564(b)(1) of the Act, 21 U.S.C. [id number]) (1), unless the authorization is terminated or revoked sooner. When diagnostic testing is negative, the possibility of a false negative result should be considered in the context of a  patient's recent exposures and the presence of clinical signs and symptoms consistent with COVID-19. An individual without symptoms of COVID- 19 and who is not shedding SARS-CoV-2  virus would expect to have a negative (not detected) result in this assay. Performed At: [facility] [place], [place] [id number] Rush Farmer MD [id number]    [place]     Comment: Performed at [facility], [address] [place], [place] [address]  Basic metabolic panel     Status: Abnormal   Collection Time: 02/15/20  4:56 PM  Result Value Ref Range   Glucose 224 (H) 65 - 99 mg/dL   BUN 9 6 - 24 mg/dL   Creatinine, Ser 0.80 0.57 - 1.00 mg/dL   GFR calc non Af Amer 85 >59 mL/min/1.73   GFR calc Af Amer 98 >59 mL/min/1.73   BUN/Creatinine Ratio 11 9 - 23   Sodium 140 134 - 144 mmol/L   Potassium 4.4 3.5 - 5.2 mmol/L   Chloride 103 96 - 106 mmol/L   CO2 21 20 - 29 mmol/L   Calcium 9.3 8.7 - 10.2 mg/dL  CBC     Status: None   Collection Time: 02/15/20  4:56 PM  Result Value Ref Range   WBC 4.9 3.4 - 10.8 x10E3/uL  RBC 4.47 3.77 - 5.28 x10E6/uL   Hemoglobin 13.4 11.1 - 15.9 g/dL   Hematocrit 38.9 34.0 - 46.6 %   MCV 87 79 - 97 fL   MCH 30.0 26.6 - 33.0 pg   MCHC 34.4 31.5 - 35.7 g/dL   RDW 12.8 11.7 - 15.4 %   Platelets 172 150 - 450 x10E3/uL   Objective  Body mass index is 29.94 kg/m. Wt Readings from Last 3 Encounters:  02/16/20 169 lb (76.7 kg)  02/15/20 168 lb (76.2 kg)  02/07/20 168 lb 12.8 oz (76.6 kg)   Temp Readings from Last 3 Encounters:  02/16/20 (!) 97.3 F (36.3 C) (Temporal)  02/07/20 98.4 F (36.9 C) (Oral)  01/13/20 (!) 96 F (35.6 C) (Oral)   BP Readings from Last 3 Encounters:  02/16/20 124/78  02/15/20 120/68  02/07/20 (!) 154/71   Pulse Readings from Last 3 Encounters:  02/16/20 64  02/15/20 61  02/07/20 62    Physical Exam Vitals and nursing note reviewed.  Constitutional:       Appearance: Normal appearance. She is well-developed and well-groomed. She is obese.  HENT:     Head: Normocephalic and atraumatic.  Eyes:     Conjunctiva/sclera: Conjunctivae normal.     Pupils: Pupils are equal, round, and reactive to light.  Cardiovascular:     Rate and Rhythm: Normal rate and regular rhythm.     Heart sounds: Normal heart sounds. No murmur.  Pulmonary:     Effort: Pulmonary effort is normal.     Breath sounds: Normal breath sounds.  Abdominal:     General: Abdomen is flat. Bowel sounds are normal.     Tenderness: There is abdominal tenderness in the right upper quadrant and right lower quadrant.     Comments: Mild to mod ttp   Musculoskeletal:     Right lower leg: No edema.     Left lower leg: No edema.  Skin:    General: Skin is warm and dry.  Neurological:     General: No focal deficit present.     Mental Status: She is alert and oriented to person, place, and time. Mental status is at baseline.     Gait: Gait normal.  Psychiatric:        Attention and Perception: Attention and perception normal.        Mood and Affect: Mood and affect normal.        Speech: Speech normal.        Behavior: Behavior normal. Behavior is cooperative.        Thought Content: Thought content normal.        Cognition and Memory: Cognition and memory normal.        Judgment: Judgment normal.     Assessment  Plan  Mid back pain - Plan: DG Lumbar Spine Complete, DG Thoracic Spine 2 View  Acute bilateral low back pain, unspecified whether sciatica present - Plan: DG Lumbar Spine Complete, DG Thoracic Spine 2 View  Generalized abdominal pain with h/o colitis and kidney stones - Plan: CT Abdomen Pelvis Wo Contrast Right upper quadrant abdominal pain - Plan: CT Abdomen Pelvis Wo Contrast Right lower quadrant abdominal pain - Plan: CT Abdomen Pelvis Wo Contrast  Fatigue, unspecified type Cath sch 02/22/20 and w/u with imaging  Labs recently normal  tfts appt endocrine 02/19/20    Essential hypertension  Cont meds controlled today see HPI for meds  HM  Flu shot utd pna 23 utd  Tdap  utd covid vx had 2/2  Consider shingrix in future  mammo 11/11/19 negative  DEXA 11/11/19 normal  Colonoscopy 08/30/17 normal Dr. Silvio Pate h/o colitis   Pap consider in the future  SPEP abnormal consider nephrotic proteinuria consider renal in future 10/15/2019 no abnormal bands but low urine creatinine and protein   Endocrine [facility] Dr. Steffanie Dunn appt 02/19/20  Cards Dr. Gerrit Halls  [facility]-migraines  Dermatology Dr. Laurence Ferrari saw 12/2019 Rheumatology Dr. Estanislado Pandy  Hand surgery Dr. Fredna Dow  Provider: Dr. Olivia Mackie McLean-Scocuzza-Internal Medicine

## 2020-02-16 NOTE — Patient Instructions (Addendum)
Results for [name], [name] (MRN [id number]) as of 02/16/2020 13:05  Ref. Range 11/17/2019 14:04 01/04/2020 09:37 02/15/2020 16:56  Glucose Latest Ref Range: 65 - 99 mg/dL 174 (H)  224 (H)  Hemoglobin A1C Latest Ref Range: 4.6 - 6.5 %  7.0 (H)   TSH Latest Ref Range: 0.35 - 4.50 uIU/mL  <0.01 Repeated and verified X2. (L)   Triiodothyronine,Free,Serum Latest Ref Range: 2.3 - 4.2 pg/mL  3.2   T4,Free(Direct) Latest Ref Range: 0.60 - 1.60 ng/dL  1.73 (H)   Thyroperoxidase Ab SerPl-aCnc Latest Ref Range: <9 IU/mL  284 (H)   Results for [name], [name] (MRN [id number]) as of 02/16/2020 13:05  Ref. Range 02/15/2020 16:56  Sodium Latest Ref Range: 134 - 144 mmol/L 140  Potassium Latest Ref Range: 3.5 - 5.2 mmol/L 4.4  Chloride Latest Ref Range: 96 - 106 mmol/L 103  CO2 Latest Ref Range: 20 - 29 mmol/L 21  Glucose Latest Ref Range: 65 - 99 mg/dL 224 (H)  BUN Latest Ref Range: 6 - 24 mg/dL 9  Creatinine Latest Ref Range: 0.57 - 1.00 mg/dL 0.80  Calcium Latest Ref Range: 8.7 - 10.2 mg/dL 9.3  BUN/Creatinine Ratio Latest Ref Range: 9 - 23  11  GFR, Est Non African American Latest Ref Range: >59 mL/min/1.73 85  GFR, Est African American Latest Ref Range: >59 mL/min/1.73 98  WBC Latest Ref Range: 3.4 - 10.8 x10E3/uL 4.9  RBC Latest Ref Range: 3.77 - 5.28 x10E6/uL 4.47  Hemoglobin Latest Ref Range: 11.1 - 15.9 g/dL 13.4  HCT Latest Ref Range: 34.0 - 46.6 % 38.9  MCV Latest Ref Range: 79 - 97 fL 87  MCH Latest Ref Range: 26.6 - 33.0 pg 30.0  MCHC Latest Ref Range: 31.5 - 35.7 g/dL 34.4  RDW Latest Ref Range: 11.7 - 15.4 % 12.8  Platelets Latest Ref Range: 150 - 450 x10E3/uL 172  Results for [name], [name] (MRN [id number]) as of 02/16/2020 13:06  Ref. Range 01/04/2020 09:37  Total CHOL/HDL Ratio Unknown 2  Cholesterol Latest Ref Range: 0 - 200 mg/dL 166  HDL Cholesterol Latest Ref Range: >39.00 mg/dL 78.10  LDL (calc) Latest Ref Range: 0 - 99 mg/dL 58  NonHDL Unknown 87.77  Triglycerides Latest Ref Range:  0.0 - 149.0 mg/dL 151.0 (H)  VLDL Latest Ref Range: 0.0 - 40.0 mg/dL 30.2

## 2020-02-18 ENCOUNTER — Telehealth: Payer: Self-pay | Admitting: *Deleted

## 2020-02-18 NOTE — Telephone Encounter (Signed)
Pt contacted pre-catheterization scheduled at [facility] for: Monday February 22, 2020 7:30 AM Verified arrival time and place: [place] [facility]) at: 5:30 AM   No solid food after midnight prior to cath, clear liquids until 5 AM day of procedure.  Contrast allergy: yes-13 hour Prednisone and Benadryl Prep reviewed with patient: 02/21/20 Prednisone 50 mg 6:30 PM 02/22/20 Prednisone 50 mg 12:30 AM 02/22/20 Prednisone 50 mg and Benadryl 50 mg AM of procedure just prior to leaving for hospital Pt advised not to drive to hospital.  Hold: Insulin Pump-pt will manage.  AM meds can be  taken pre-cath with sip of water including: ASA 81 mg Plavix 75 mg  Prednisone 50 mg Benadryl 50 mg  Confirmed patient has responsible adult to drive home post procedure and observe 24 hours after arriving home: yes  Currently, due to Covid-19 pandemic, only one person will be allowed with patient. Must be the same person for patient's entire stay and will be required to wear a mask. They will be asked to wait in the waiting room for the duration of the patient's stay.  Patients are required to wear a mask when they enter the hospital.         COVID-19 Pre-Screening Questions:  . In the past 7 to 10 days have you had a cough,  shortness of breath, headache, congestion, fever (100 or greater) body aches, chills, sore throat, or sudden loss of taste or sense of smell? Symptoms not related to Covid . Have you been around anyone with known Covid 19 in past 7-10 days? no . Have you been around anyone who is awaiting Covid 19 test results in the past 7 to 10 days? no . Have you been around anyone who has been exposed to Covid 19, or has mentioned symptoms of Covid 19 within the past 7 to 10 days? no  I reviewed procedure/mask/visitor instructions, COVID-19 screening questions with patient, she verbalized understanding, thanked me for call.  Pt states she has had both doses  COVID-19 vaccine.

## 2020-02-19 ENCOUNTER — Other Ambulatory Visit (HOSPITAL_COMMUNITY)
Admission: RE | Admit: 2020-02-19 | Discharge: 2020-02-19 | Disposition: A | Discharge status: Home / Self Care | Payer: 59 | Source: Ambulatory Visit | Attending: Cardiovascular Disease | Admitting: Cardiovascular Disease

## 2020-02-19 DIAGNOSIS — Z01812 Encounter for preprocedural laboratory examination: Secondary | ICD-10-CM | POA: Diagnosis not present

## 2020-02-19 DIAGNOSIS — Z20822 Contact with and (suspected) exposure to covid-19: Secondary | ICD-10-CM | POA: Diagnosis not present

## 2020-02-19 LAB — SARS CORONAVIRUS 2 (TAT 6-24 HRS): SARS Coronavirus 2: NEGATIVE

## 2020-02-22 ENCOUNTER — Ambulatory Visit (HOSPITAL_COMMUNITY)
Admission: RE | Admit: 2020-02-22 | Discharge: 2020-02-22 | Disposition: A | Discharge status: Home / Self Care | Payer: 59 | Source: Ambulatory Visit | Attending: Cardiovascular Disease | Admitting: Cardiovascular Disease

## 2020-02-22 ENCOUNTER — Other Ambulatory Visit: Payer: Self-pay

## 2020-02-22 ENCOUNTER — Encounter (HOSPITAL_COMMUNITY)
Admission: RE | Disposition: A | Discharge status: Home / Self Care | Payer: Self-pay | Source: Ambulatory Visit | Attending: Cardiovascular Disease

## 2020-02-22 DIAGNOSIS — M069 Rheumatoid arthritis, unspecified: Secondary | ICD-10-CM | POA: Diagnosis not present

## 2020-02-22 DIAGNOSIS — Z8349 Family history of other endocrine, nutritional and metabolic diseases: Secondary | ICD-10-CM | POA: Diagnosis not present

## 2020-02-22 DIAGNOSIS — Z888 Allergy status to other drugs, medicaments and biological substances status: Secondary | ICD-10-CM | POA: Diagnosis not present

## 2020-02-22 DIAGNOSIS — Z791 Long term (current) use of non-steroidal anti-inflammatories (NSAID): Secondary | ICD-10-CM | POA: Insufficient documentation

## 2020-02-22 DIAGNOSIS — G5603 Carpal tunnel syndrome, bilateral upper limbs: Secondary | ICD-10-CM | POA: Insufficient documentation

## 2020-02-22 DIAGNOSIS — I25118 Atherosclerotic heart disease of native coronary artery with other forms of angina pectoris: Secondary | ICD-10-CM | POA: Diagnosis not present

## 2020-02-22 DIAGNOSIS — I252 Old myocardial infarction: Secondary | ICD-10-CM | POA: Diagnosis not present

## 2020-02-22 DIAGNOSIS — K219 Gastro-esophageal reflux disease without esophagitis: Secondary | ICD-10-CM | POA: Insufficient documentation

## 2020-02-22 DIAGNOSIS — Z7902 Long term (current) use of antithrombotics/antiplatelets: Secondary | ICD-10-CM | POA: Insufficient documentation

## 2020-02-22 DIAGNOSIS — Z833 Family history of diabetes mellitus: Secondary | ICD-10-CM | POA: Insufficient documentation

## 2020-02-22 DIAGNOSIS — I2584 Coronary atherosclerosis due to calcified coronary lesion: Secondary | ICD-10-CM | POA: Insufficient documentation

## 2020-02-22 DIAGNOSIS — E10319 Type 1 diabetes mellitus with unspecified diabetic retinopathy without macular edema: Secondary | ICD-10-CM | POA: Insufficient documentation

## 2020-02-22 DIAGNOSIS — I4891 Unspecified atrial fibrillation: Secondary | ICD-10-CM | POA: Insufficient documentation

## 2020-02-22 DIAGNOSIS — Z951 Presence of aortocoronary bypass graft: Secondary | ICD-10-CM | POA: Insufficient documentation

## 2020-02-22 DIAGNOSIS — I10 Essential (primary) hypertension: Secondary | ICD-10-CM | POA: Insufficient documentation

## 2020-02-22 DIAGNOSIS — Z9641 Presence of insulin pump (external) (internal): Secondary | ICD-10-CM | POA: Diagnosis not present

## 2020-02-22 DIAGNOSIS — E0500 Thyrotoxicosis with diffuse goiter without thyrotoxic crisis or storm: Secondary | ICD-10-CM | POA: Insufficient documentation

## 2020-02-22 DIAGNOSIS — Z79899 Other long term (current) drug therapy: Secondary | ICD-10-CM | POA: Insufficient documentation

## 2020-02-22 DIAGNOSIS — Z955 Presence of coronary angioplasty implant and graft: Secondary | ICD-10-CM | POA: Diagnosis not present

## 2020-02-22 DIAGNOSIS — Z794 Long term (current) use of insulin: Secondary | ICD-10-CM | POA: Insufficient documentation

## 2020-02-22 DIAGNOSIS — E785 Hyperlipidemia, unspecified: Secondary | ICD-10-CM | POA: Diagnosis not present

## 2020-02-22 DIAGNOSIS — Z8249 Family history of ischemic heart disease and other diseases of the circulatory system: Secondary | ICD-10-CM | POA: Diagnosis not present

## 2020-02-22 DIAGNOSIS — I25119 Atherosclerotic heart disease of native coronary artery with unspecified angina pectoris: Secondary | ICD-10-CM

## 2020-02-22 HISTORY — PX: LEFT HEART CATH AND CORS/GRAFTS ANGIOGRAPHY: CATH118250

## 2020-02-22 LAB — GLUCOSE, CAPILLARY
Glucose-Capillary: 142 mg/dL — ABNORMAL HIGH (ref 70–99)
Glucose-Capillary: 87 mg/dL (ref 70–99)
Glucose-Capillary: 266 mg/dL — ABNORMAL HIGH (ref 70–99)

## 2020-02-22 SURGERY — LEFT HEART CATH AND CORS/GRAFTS ANGIOGRAPHY
Anesthesia: LOCAL

## 2020-02-22 MED ORDER — ONDANSETRON HCL 4 MG/2ML IJ SOLN: 4.0000 mg | Freq: Four times a day (QID) | INTRAMUSCULAR | Status: DC | PRN

## 2020-02-22 MED ORDER — SODIUM CHLORIDE 0.9 % IV SOLN: 250.0000 mL | INTRAVENOUS | Status: DC | PRN

## 2020-02-22 MED ORDER — FENTANYL CITRATE (PF) 100 MCG/2ML IJ SOLN
INTRAMUSCULAR | Status: AC
  Filled 2020-02-22: qty 2

## 2020-02-22 MED ORDER — MIDAZOLAM HCL 2 MG/2ML IJ SOLN
INTRAMUSCULAR | Status: AC
  Filled 2020-02-22: qty 2

## 2020-02-22 MED ORDER — SODIUM CHLORIDE 0.9% FLUSH: 3.0000 mL | Freq: Two times a day (BID) | INTRAVENOUS | Status: DC

## 2020-02-22 MED ORDER — HYDRALAZINE HCL 20 MG/ML IJ SOLN: 10.0000 mg | INTRAMUSCULAR | Status: DC | PRN

## 2020-02-22 MED ORDER — ASPIRIN 81 MG PO CHEW: 81.0000 mg | CHEWABLE_TABLET | ORAL | Status: DC

## 2020-02-22 MED ORDER — LABETALOL HCL 5 MG/ML IV SOLN: 10.0000 mg | INTRAVENOUS | Status: DC | PRN

## 2020-02-22 MED ORDER — SODIUM CHLORIDE 0.9 % WEIGHT BASED INFUSION
3.0000 mL/kg/h | INTRAVENOUS | Status: AC
  Administered 2020-02-22: 3 mL/kg/h via INTRAVENOUS

## 2020-02-22 MED ORDER — FENTANYL CITRATE (PF) 100 MCG/2ML IJ SOLN
INTRAMUSCULAR | Status: DC | PRN
  Administered 2020-02-22 (×2): 50 ug via INTRAVENOUS

## 2020-02-22 MED ORDER — HEPARIN (PORCINE) IN NACL 1000-0.9 UT/500ML-% IV SOLN
INTRAVENOUS | Status: DC | PRN
  Administered 2020-02-22 (×2): 500 mL

## 2020-02-22 MED ORDER — IOHEXOL 350 MG/ML SOLN
INTRAVENOUS | Status: DC | PRN
  Administered 2020-02-22: 95 mL

## 2020-02-22 MED ORDER — SODIUM CHLORIDE 0.9% FLUSH: 3.0000 mL | INTRAVENOUS | Status: DC | PRN

## 2020-02-22 MED ORDER — SODIUM CHLORIDE 0.9 % WEIGHT BASED INFUSION: 1.0000 mL/kg/h | INTRAVENOUS | Status: DC

## 2020-02-22 MED ORDER — LIDOCAINE HCL (PF) 1 % IJ SOLN
INTRAMUSCULAR | Status: DC | PRN
  Administered 2020-02-22: 15 mL

## 2020-02-22 MED ORDER — HEPARIN (PORCINE) IN NACL 1000-0.9 UT/500ML-% IV SOLN
INTRAVENOUS | Status: AC
  Filled 2020-02-22: qty 1000

## 2020-02-22 MED ORDER — SODIUM CHLORIDE 0.9 % IV SOLN: INTRAVENOUS | Status: AC

## 2020-02-22 MED ORDER — ACETAMINOPHEN 325 MG PO TABS: 650.0000 mg | ORAL_TABLET | ORAL | Status: DC | PRN

## 2020-02-22 MED ORDER — MIDAZOLAM HCL 2 MG/2ML IJ SOLN
INTRAMUSCULAR | Status: DC | PRN
  Administered 2020-02-22: 1 mg via INTRAVENOUS
  Administered 2020-02-22: 2 mg via INTRAVENOUS

## 2020-02-22 MED ORDER — LIDOCAINE HCL (PF) 1 % IJ SOLN
INTRAMUSCULAR | Status: AC
  Filled 2020-02-22: qty 30

## 2020-02-22 SURGICAL SUPPLY — 12 items
CATH INFINITI 5 FR RCB (CATHETERS) ×1 IMPLANT
CATH INFINITI 5FR AL1 (CATHETERS) ×1 IMPLANT
CATH INFINITI 5FR MULTPACK ANG (CATHETERS) ×1 IMPLANT
KIT HEART LEFT (KITS) ×2 IMPLANT
KIT MICROPUNCTURE NIT STIFF (SHEATH) ×1 IMPLANT
PACK CARDIAC CATHETERIZATION (CUSTOM PROCEDURE TRAY) ×2 IMPLANT
SHEATH PINNACLE 5F 10CM (SHEATH) ×1 IMPLANT
SHEATH PROBE COVER 6X72 (BAG) ×1 IMPLANT
SYR MEDRAD MARK 7 150ML (SYRINGE) ×2 IMPLANT
TRANSDUCER W/STOPCOCK (MISCELLANEOUS) ×2 IMPLANT
TUBING CIL FLEX 10 FLL-RA (TUBING) ×2 IMPLANT
WIRE EMERALD 3MM-J .035X150CM (WIRE) ×1 IMPLANT

## 2020-02-22 NOTE — Interval H&P Note (Signed)
History and Physical Interval Note:  02/22/2020 7:25 AM  [name]  has presented today for surgery, with the diagnosis of chest pain.  The various methods of treatment have been discussed with the patient and family. After consideration of risks, benefits and other options for treatment, the patient has consented to  Procedure(s): LEFT HEART CATH AND CORS/GRAFTS ANGIOGRAPHY (N/A) as a surgical intervention.  The patient's history has been reviewed, patient examined, no change in status, stable for surgery.  I have reviewed the patient's chart and labs.  Questions were answered to the patient's satisfaction.    Cath Lab Visit (complete for each Cath Lab visit)  Clinical Evaluation Leading to the Procedure:   ACS: No.  Non-ACS:    Anginal Classification: CCS III  Anti-ischemic medical therapy: Maximal Therapy (2 or more classes of medications)  Non-Invasive Test Results: No non-invasive testing performed  Prior CABG: Previous CABG        [name]

## 2020-02-22 NOTE — Progress Notes (Signed)
Discharge instructions reviewed with pt and her daughter both voice understanding. Ambulated in hallway and to the bathroom to void tol well no bleeding noted form groin site before or after ambulation.

## 2020-02-22 NOTE — Discharge Instructions (Signed)
Femoral Site Care This sheet gives you information about how to care for yourself after your procedure. Your health care provider may also give you more specific instructions. If you have problems or questions, contact your health care provider. What can I expect after the procedure? After the procedure, it is common to have:  Bruising that usually fades within 1-2 weeks.  Tenderness at the site. Follow these instructions at home: Wound care  Follow instructions from your health care provider about how to take care of your insertion site. Make sure you: ? Wash your hands with soap and water before you change your bandage (dressing). If soap and water are not available, use hand sanitizer. ? Change your dressing as told by your health care provider. ? Leave stitches (sutures), skin glue, or adhesive strips in place. These skin closures may need to stay in place for 2 weeks or longer. If adhesive strip edges start to loosen and curl up, you may trim the loose edges. Do not remove adhesive strips completely unless your health care provider tells you to do that.  Do not take baths, swim, or use a hot tub until your health care provider approves.  You may shower 24-48 hours after the procedure or as told by your health care provider. ? Gently wash the site with plain soap and water. ? Pat the area dry with a clean towel. ? Do not rub the site. This may cause bleeding.  Do not apply powder or lotion to the site. Keep the site clean and dry.  Check your femoral site every day for signs of infection. Check for: ? Redness, swelling, or pain. ? Fluid or blood. ? Warmth. ? Pus or a bad smell. Activity  For the first 2-3 days after your procedure, or as long as directed: ? Avoid climbing stairs as much as possible. ? Do not squat.  Do not lift anything that is heavier than 10 lb (4.5 kg), or the limit that you are told, until your health care provider says that it is safe.  Rest as  directed. ? Avoid sitting for a long time without moving. Get up to take short walks every 1-2 hours.  Do not drive for 24 hours if you were given a medicine to help you relax (sedative). General instructions  Take over-the-counter and prescription medicines only as told by your health care provider.  Keep all follow-up visits as told by your health care provider. This is important. Contact a health care provider if you have:  A fever or chills.  You have redness, swelling, or pain around your insertion site. Get help right away if:  The catheter insertion area swells very fast.  You pass out.  You suddenly start to sweat or your skin gets clammy.  The catheter insertion area is bleeding, and the bleeding does not stop when you hold steady pressure on the area.  The area near or just beyond the catheter insertion site becomes pale, cool, tingly, or numb. These symptoms may represent a serious problem that is an emergency. Do not wait to see if the symptoms will go away. Get medical help right away. Call your local emergency services (911 in the [place] Do not drive yourself to the hospital. Summary  After the procedure, it is common to have bruising that usually fades within 1-2 weeks.  Check your femoral site every day for signs of infection.  Do not lift anything that is heavier than 10 lb (4.5 kg), or the   limit that you are told, until your health care provider says that it is safe. This information is not intended to replace advice given to you by your health care provider. Make sure you discuss any questions you have with your health care provider. Document Revised: 12/30/2017 Document Reviewed: 12/30/2017 Elsevier Patient Education  2020 [employer]  

## 2020-02-22 NOTE — Progress Notes (Signed)
Site area: right groin  Site Prior to Removal:  Level 0  Pressure Applied For 15  MINUTES    Minutes Beginning at 0835   Manual:   Yes.    Patient Status During Pull:  Stable   Post Pull Groin Site:  Level 0  Post Pull Instructions Given:  Yes.    Post Pull Pulses Present:  Yes.    Dressing Applied:  Yes.    Comments:  Bed rest started at 0850 X 4 hr.

## 2020-02-23 DIAGNOSIS — E0500 Thyrotoxicosis with diffuse goiter without thyrotoxic crisis or storm: Secondary | ICD-10-CM | POA: Diagnosis not present

## 2020-02-23 DIAGNOSIS — E10319 Type 1 diabetes mellitus with unspecified diabetic retinopathy without macular edema: Secondary | ICD-10-CM | POA: Diagnosis not present

## 2020-02-24 ENCOUNTER — Telehealth: Payer: Self-pay | Admitting: *Deleted

## 2020-02-24 NOTE — Telephone Encounter (Signed)
-----   Message from [name]., NP sent at 02/23/2020  9:01 PM EST ----- Please call the patient and tell her the Covid test was negative for the virus.

## 2020-02-24 NOTE — Telephone Encounter (Signed)
covid test done for heart cath on 02/22/2020.

## 2020-02-25 ENCOUNTER — Other Ambulatory Visit: Payer: Self-pay | Admitting: Rheumatology

## 2020-02-25 NOTE — Telephone Encounter (Signed)
Last Visit: 01/06/2020 Next Visit: 04/08/2020 Labs: 02/15/20 Glucose 224  Okay to refill per Dr. Estanislado Pandy

## 2020-02-26 ENCOUNTER — Ambulatory Visit
Admission: RE | Admit: 2020-02-26 | Discharge: 2020-02-26 | Disposition: A | Discharge status: Home / Self Care | Payer: 59 | Source: Ambulatory Visit | Attending: Internal Medicine | Admitting: Internal Medicine

## 2020-02-26 ENCOUNTER — Other Ambulatory Visit: Payer: Self-pay

## 2020-02-26 ENCOUNTER — Ambulatory Visit
Admission: RE | Admit: 2020-02-26 | Discharge: 2020-02-26 | Disposition: A | Discharge status: Home / Self Care | Payer: 59 | Attending: Internal Medicine | Admitting: Internal Medicine

## 2020-02-26 DIAGNOSIS — M549 Dorsalgia, unspecified: Secondary | ICD-10-CM

## 2020-02-26 DIAGNOSIS — M545 Low back pain, unspecified: Secondary | ICD-10-CM

## 2020-02-26 DIAGNOSIS — M4804 Spinal stenosis, thoracic region: Secondary | ICD-10-CM | POA: Diagnosis not present

## 2020-02-29 ENCOUNTER — Ambulatory Visit
Admission: RE | Admit: 2020-02-29 | Discharge: 2020-02-29 | Disposition: A | Discharge status: Home / Self Care | Payer: 59 | Source: Ambulatory Visit | Attending: Internal Medicine | Admitting: Internal Medicine

## 2020-02-29 ENCOUNTER — Other Ambulatory Visit: Payer: Self-pay

## 2020-02-29 DIAGNOSIS — R1011 Right upper quadrant pain: Secondary | ICD-10-CM | POA: Diagnosis not present

## 2020-02-29 DIAGNOSIS — N200 Calculus of kidney: Secondary | ICD-10-CM | POA: Diagnosis not present

## 2020-02-29 DIAGNOSIS — R1084 Generalized abdominal pain: Secondary | ICD-10-CM | POA: Insufficient documentation

## 2020-02-29 DIAGNOSIS — R1031 Right lower quadrant pain: Secondary | ICD-10-CM | POA: Diagnosis not present

## 2020-03-02 ENCOUNTER — Encounter: Payer: Self-pay | Admitting: Internal Medicine

## 2020-03-02 ENCOUNTER — Other Ambulatory Visit: Payer: Self-pay | Admitting: Internal Medicine

## 2020-03-02 ENCOUNTER — Ambulatory Visit: Payer: 59

## 2020-03-02 DIAGNOSIS — K5900 Constipation, unspecified: Secondary | ICD-10-CM

## 2020-03-02 DIAGNOSIS — R1084 Generalized abdominal pain: Secondary | ICD-10-CM

## 2020-03-02 DIAGNOSIS — K529 Noninfective gastroenteritis and colitis, unspecified: Secondary | ICD-10-CM

## 2020-03-02 DIAGNOSIS — R935 Abnormal findings on diagnostic imaging of other abdominal regions, including retroperitoneum: Secondary | ICD-10-CM

## 2020-03-02 NOTE — Progress Notes (Signed)
I have reviewed and agreed above plan. 

## 2020-03-03 ENCOUNTER — Ambulatory Visit: Payer: 59 | Admitting: Gastroenterology

## 2020-03-03 ENCOUNTER — Other Ambulatory Visit: Payer: Self-pay

## 2020-03-03 ENCOUNTER — Telehealth: Payer: Self-pay | Admitting: Internal Medicine

## 2020-03-03 VITALS — BP 120/76 | HR 64 | Temp 98.3°F | Ht 63.0 in | Wt 170.6 lb

## 2020-03-03 DIAGNOSIS — R9389 Abnormal findings on diagnostic imaging of other specified body structures: Secondary | ICD-10-CM | POA: Diagnosis not present

## 2020-03-03 DIAGNOSIS — R109 Unspecified abdominal pain: Secondary | ICD-10-CM

## 2020-03-03 DIAGNOSIS — K219 Gastro-esophageal reflux disease without esophagitis: Secondary | ICD-10-CM

## 2020-03-03 MED ORDER — HYOSCYAMINE SULFATE 0.125 MG PO TABS: 0.1250 mg | ORAL_TABLET | Freq: Three times a day (TID) | ORAL | 2 refills | Status: DC | PRN

## 2020-03-03 NOTE — Telephone Encounter (Signed)
See patient message from 03/03. Encounter resolved.

## 2020-03-03 NOTE — Telephone Encounter (Signed)
Pt was returning your call.

## 2020-03-03 NOTE — Addendum Note (Signed)
Addended by: Dorethea Clan on: 03/03/2020 01:35 PM   Modules accepted: Orders

## 2020-03-03 NOTE — Telephone Encounter (Signed)
Spoke with patient and she sees Dr Vicente Males today. Nothing further needed.

## 2020-03-03 NOTE — Progress Notes (Signed)
Lisa Bellows MD, MRCP(U.K) [address]  [place]  [place], [place] [address]  Main: [phone]  Fax: [phone]   Gastroenterology Consultation  Referring Provider:     McLean-Scocuzza, Olivia Mackie * Primary Care Physician:  McLean-Scocuzza, Nino Glow, MD Primary Gastroenterologist:  Dr. Jonathon [name]  Reason for Consultation:     Colitis         HPI:   AL NOURY is a 54 y.o. y/o female referred for consultation & management  by Dr. Terese Door, Nino Glow, MD.  Prior GI notes , procedure reviewed and summarized.   She is a nurse at our endoscopy unit and has a history of CAD, CABG. Colonoscopy in 2015 at [facility] , tubular adenoma excised , biopsies did not show microscopic colitis,Ct scan in 2018 showed mild wall thickening of the cecum without much surrounding inflammation. She recently underwent a CT scan for abdominal pain.   02/29/2020: Ct scan of abdomen : Apparent focal area of wall thickening of the proximal ascending colon which may be related to underdistention or represent mild focal colitis. An infiltrative process is not excluded. Clinical correlation and follow-up with colonoscopy after resolution of acute symptoms recommended. No bowel obstruction. Normal appendix  07/2017: Colonoscopy normal  02/15/2020: HB 13.4  She states that she has been having right-sided abdominal pain for the last 4 weeks on and off each episode lasting about 20 minutes usually worse after she eats and associated with diarrhea.  She also has gas and bloating.  Similar episode had happened a couple of years back when she had a colonoscopy.  She also takes a nonsteroidal every day.  Consumes 4 bottles of Diet Coke a day.  No other artificial sugars.  Abdominal pain is ongoing.  Had radiated to the back previously.  She also has a history of acid reflux and breakthrough heartburn despite being on pantoprazole 40 mg a day in the morning.  She recalls she may have had erosions in the past.  Has had  reflux disease for over 10 years.   Past Medical History:  Diagnosis Date  . ACUT MI ANTEROLAT WALL SUBSQT EPIS CARE   . Acute maxillary sinusitis   . ALLERGIC RHINITIS, SEASONAL   . ARTHRITIS, RHEUMATOID    shoulders and hands Enbrel>leg swelling Dr. Estanislado Pandy  . Atrial fibrillation ([place])    a. after CABG.  . Back pain   . Bruxism (teeth grinding)   . CAD, ARTERY BYPASS GRAFT    a. DES to RCA in 2010 then LAD occlusion s/p CABG 3 06/07/2009 with LIMA to LAD, reverse SVG to D1, reverse SVG to distal RCA. b. Cath 05/08/2016 slightly hypodense region in the intermediate branch, however she had excellent flow, FFR was normal. Vein graft to PDA and the posterolateral branch is patent, patent LIMA to LAD, occluded SVG to diagonal.  . CARPAL TUNNEL SYNDROME, BILATERAL   . CHOLELITHIASIS   . Contrast media allergy   . DERMATITIS, ALLERGIC   . DIABETES MELLITUS, TYPE I    on insulin pump dx'ed age 37 y.o   . DIABETIC  RETINOPATHY   . Hiatal hernia   . HYPERLIPIDEMIA-MIXED   . HYPERTHYROIDISM    Dr. Dollene Cleveland  . Insulin pump in place   . MIGRAINE W/O AURA W/INTRACT W/STATUS MIGRAINOSUS 02/19/2008  . Psoriasis   . SINUS TACHYCARDIA 11/08/2010  . SVT (supraventricular tachycardia) ([facility])    after s/p CABG  . TRIGGER FINGER    all fingers b/l hands   .  URI     Past Surgical History:  Procedure Laterality Date  . ABDOMINAL HYSTERECTOMY     endometriomas b/l; total ? cervix removed; no h/o abnormal pap  . Caesarean section    . CARDIAC CATHETERIZATION N/A 05/08/2016   Procedure: Left Heart Cath and Cors/Grafts Angiography;  Surgeon: Burnell Blanks, MD;  Location: [facility];  Service: Cardiovascular;  Laterality: N/A;  . CARPAL TUNNEL RELEASE     b/l   . CATARACT EXTRACTION, BILATERAL    . CHOLECYSTECTOMY    . CORONARY ARTERY BYPASS GRAFT    . EYE SURGERY     laser x 2 for retinopathy   . LEFT HEART CATH AND CORS/GRAFTS ANGIOGRAPHY N/A 02/22/2020   Procedure: LEFT  HEART CATH AND CORS/GRAFTS ANGIOGRAPHY;  Surgeon: Burnell Blanks, MD;  Location: [facility];  Service: Cardiovascular;  Laterality: N/A;  . TRIGGER FINGER RELEASE     b/l fingers all   . VITRECTOMY     b/l     Prior to Admission medications   Medication Sig Start Date End Date Taking? Authorizing Provider  amLODipine (NORVASC) 5 MG tablet Take 1 tablet (5 mg total) by mouth daily. 02/11/20 02/05/21  Sherren Mocha, MD  aspirin EC 81 MG tablet Take 81 mg by mouth daily.    [provider]  CHELATED MAGNESIUM PO Take 250 mg by mouth daily.    [provider]  Cholecalciferol (VITAMIN D) 125 MCG (5000 UT) CAPS Take 5,000 Units by mouth daily.    [provider]  clopidogrel (PLAVIX) 75 MG tablet TAKE 1 TABLET BY MOUTH ONCE DAILY Patient taking differently: Take 75 mg by mouth daily.  05/15/19   Sherren Mocha, MD  cyclobenzaprine (FLEXERIL) 10 MG tablet Take 1 tablet (10 mg total) by mouth at bedtime. 02/15/20   Bo Merino, MD  Diclofenac Potassium (CAMBIA) 50 MG PACK Take 50 mg by mouth as needed. Patient not taking: Reported on 02/16/2020 10/08/19   Suzzanne Cloud, NP  EPINEPHrine 0.3 mg/0.3 mL IJ SOAJ injection Inject 0.3 mLs (0.3 mg total) into the muscle as needed for anaphylaxis. 03/09/19   Breeback, Royetta Car, PA-C  folic acid (FOLVITE) 1 MG tablet .AKE 2 TABLETS (2 MG TOTAL) BY MOUTH DAILY. Patient taking differently: Take 2 mg by mouth daily.  11/30/19   Ofilia Neas, PA-C  furosemide (LASIX) 40 MG tablet Take 40 mg by mouth daily as needed for edema.     [provider]  Golimumab ([place] ARIA IV) Inject into the vein.    [provider]  insulin lispro (HUMALOG) 100 UNIT/ML injection Inject 80-90 Units into the skin continuous. FOR USE IN INSULIN PUMP. TOTAL DAILY INSULIN DOSE = UP TO 90 UNITS. 03/24/15   [provider]  isosorbide mononitrate (IMDUR) 60 MG 24 hr tablet Take 1 tablet (60 mg total) by mouth daily.  03/04/19   Sherren Mocha, MD  Javier Docker Oil 500 MG CAPS Take 500 mg by mouth daily.    [provider]  leflunomide (ARAVA) 20 MG tablet Take 1 tablet (20 mg total) by mouth daily. 01/06/20   Ofilia Neas, PA-C  losartan (COZAAR) 100 MG tablet Take 1 tablet (100 mg total) by mouth daily. 02/02/20   Sherren Mocha, MD  methimazole (TAPAZOLE) 10 MG tablet Take 5-10 mg by mouth See admin instructions. Take 10 mg by mouth in the morning and 5 mg in the afternoon 08/10/19   [provider]  metoprolol tartrate (LOPRESSOR) 50 MG tablet Take 1 tablet (50 mg total) by mouth 2 (two) times daily. 03/04/19 02/27/20  Sherren Mocha, MD  montelukast (SINGULAIR) 10 MG tablet TAKE 1 TABLET BY MOUTH AT BEDTIME Patient taking differently: Take 10 mg by mouth at bedtime.  12/14/19   Breeback, Jade L, PA-C  nabumetone (RELAFEN) 750 MG tablet TAKE 1 TABLET BY MOUTH TWICE DAILY 02/25/20   Bo Merino, MD  nitroGLYCERIN (NITROSTAT) 0.4 MG SL tablet Place 1 tablet (0.4 mg total) under the tongue every 5 (five) minutes as needed. 02/15/20 05/15/20  Verta Ellen., NP  ondansetron (ZOFRAN ODT) 8 MG disintegrating tablet Take 1 tablet (8 mg total) by mouth 2 (two) times daily. 02/07/20   Lorin Picket, PA-C  ONE TOUCH ULTRA TEST test strip  11/20/16   [provider]  pantoprazole (PROTONIX) 40 MG tablet Take 1 tablet (40 mg total) by mouth daily. 02/15/20   Verta Ellen., NP  potassium chloride (K-DUR) 10 MEQ tablet Take 1 tablet (10 mEq total) by mouth daily. Patient taking differently: Take 10 mEq by mouth daily as needed (when taking furosemide).  07/11/17   Sherren Mocha, MD  predniSONE (DELTASONE) 50 MG tablet TAKE 1 TABLET BY MOUTH 13 HOURS PRIOR TO YOUR PROCEDURE, TAKE 1 TABLE BY MOUTH 7 HOURS PRIOR TO YOUR PROCEDURE, TAKE 1 TABLET BY MOUTH AS YOU ARE LEAVING HOME FOR YOUR PROCEDURE ALONG WITH A BENADRYL 50 MG TABLET 02/15/20   Verta Ellen., NP  promethazine (PHENERGAN) 25 MG  tablet Take 1 tablet (25 mg total) by mouth every 6 (six) hours as needed for nausea or vomiting. 02/07/20   Lorin Picket, PA-C  rosuvastatin (CRESTOR) 20 MG tablet Take 1 tablet (20 mg total) by mouth daily. 03/04/19 02/27/20  Sherren Mocha, MD  venlafaxine XR (EFFEXOR XR) 37.5 MG 24 hr capsule Take 1 capsule (37.5 mg total) by mouth at bedtime. 01/13/20 02/07/20  Suzzanne Cloud, NP    Family History  Problem Relation Age of Onset  . Depression Mother   . Anxiety disorder Mother   . Colon polyps Father   . Diabetes Father        2  . Hypertension Father   . Parkinson's disease Father   . Diabetes Other   . Heart disease Other   . Hypertension Other   . Hyperlipidemia Other   . Depression Other   . Migraines Other   . Stroke Paternal Grandmother   . Heart attack Neg Hx   . Colon cancer Neg Hx   . Stomach cancer Neg Hx   . Esophageal cancer Neg Hx      Social History   Tobacco Use  . Smoking status: Never Smoker  . Smokeless tobacco: Never Used  Substance Use Topics  . Alcohol use: Yes    Comment: rarely 1 every 6 months  . Drug use: No    Allergies as of 03/03/2020 - Review Complete 02/22/2020  Allergen Reaction Noted  . Prochlorperazine Rash 03/30/2015  . Ramipril Swelling, Rash, and Other (See Comments) 06/15/2013  . Shellfish-derived products Swelling 03/30/2015  . Atorvastatin Rash 03/30/2015  . Compazine  [prochlorperazine edisylate] Other (See Comments) 04/29/2013  . Etanercept Swelling and Rash 04/29/2013  . Infliximab Rash 03/30/2015  . Iohexol  08/02/2017  . Orencia [abatacept] Rash 06/15/2013  . Shellfish allergy Swelling 02/05/2012  . Tofacitinib Rash and Other (See Comments) 03/30/2015  . Actemra [tocilizumab]  01/06/2020  . Emgality [  galcanezumab-gnlm] Hives and Swelling 07/20/2019  . Prochlorperazine edisylate  10/16/2006  . Trokendi xr [topiramate er]  05/01/2019  . Amiodarone Nausea Only 06/15/2013  . Rituximab Rash 03/30/2015    Review of  Systems:    All systems reviewed and negative except where noted in HPI.   Physical Exam:  There were no vitals taken for this visit. No LMP recorded. Patient has had a hysterectomy. Psych:  Alert and cooperative. Normal mood and affect. General:   Alert,  Well-developed, well-nourished, pleasant and cooperative in NAD Head:  Normocephalic and atraumatic. Lungs:  Respirations even and unlabored.  Clear throughout to auscultation.   No wheezes, crackles, or rhonchi. No acute distress. Heart:  Regular rate and rhythm; no murmurs, clicks, rubs, or gallops. Abdomen:  Normal bowel sounds.  No bruits.  Soft, non-tender and non-distended without masses, hepatosplenomegaly or hernias noted.  No guarding or rebound tenderness.    Neurologic:  Alert and oriented x3;  grossly normal neurologically. Psych:  Alert and cooperative. Normal mood and affect.  Imaging Studies: CT Abdomen Pelvis Wo Contrast  Result Date: 02/29/2020 CLINICAL DATA:  54 year old female with right-sided abdominal pain. EXAM: CT ABDOMEN AND PELVIS WITHOUT CONTRAST TECHNIQUE: Multidetector CT imaging of the abdomen and pelvis was performed following the standard protocol without IV contrast. COMPARISON:  CT of the abdomen pelvis dated 05/22/2017. FINDINGS: Evaluation of this exam is limited in the absence of intravenous contrast. Lower chest: There is a 3 mm left lower lobe subpleural nodule. The visualized lung bases are otherwise clear. There is multi vessel coronary vascular calcification. No intra-abdominal free air or free fluid. Hepatobiliary: The liver is unremarkable. No intrahepatic biliary ductal dilatation. Cholecystectomy. No retained calcified stone noted in the central CBD. Pancreas: Unremarkable. No pancreatic ductal dilatation or surrounding inflammatory changes. Spleen: Normal in size without focal abnormality. Adrenals/Urinary Tract: The adrenal glands are unremarkable. There is no hydronephrosis on either side. Punctate  nonobstructing right renal calculi and bilateral renal vascular calcification. The visualized ureters and urinary bladder appear unremarkable. Stomach/Bowel: There is moderate stool throughout the colon. There is no bowel obstruction. There is an apparent focal area of wall thickening of the proximal ascending colon (series 2, image 54, coronal series 5, image 38, and sagittal series 6, image 29). This is likely related to underdistention. And infiltrative process or mild focal colitis is not entirely excluded. Clinical correlation and follow-up with colonoscopy or CT with oral contrast or barium enema study after resolution of acute symptoms recommended. The appendix is normal. Vascular/Lymphatic: There is moderate aortoiliac atherosclerotic disease. The IVC is unremarkable. No portal venous gas. There is no adenopathy. Reproductive: Hysterectomy. No adnexal masses. Other: None Musculoskeletal: Osteopenia. No acute osseous pathology. IMPRESSION: 1. Apparent focal area of wall thickening of the proximal ascending colon which may be related to underdistention or represent mild focal colitis. An infiltrative process is not excluded. Clinical correlation and follow-up with colonoscopy after resolution of acute symptoms recommended. No bowel obstruction. Normal appendix. 2. Punctate nonobstructing right renal calculi. No hydronephrosis. 3. Aortic Atherosclerosis (ICD10-I70.0). Electronically Signed   By: Anner Crete M.D.   On: 02/29/2020 23:03   DG Thoracic Spine 2 View  Result Date: 02/26/2020 CLINICAL DATA:  Dorsalgia EXAM: THORACIC SPINE 2 VIEWS COMPARISON:  Chest radiograph June 10, 2018 FINDINGS: Frontal and lateral views were obtained. No fracture or spondylolisthesis. There is mild disc space narrowing at several levels in the lower thoracic region. There are several small anterior and right-sided osteophytes inferiorly in the  thoracic region. No erosive change or paraspinous lesion. Visualized lungs  clear. Multiple sternal wires are fractured, similar to prior study. There is aortic atherosclerosis. IMPRESSION: Mild osteoarthritic change in the lower thoracic region. No fracture or spondylolisthesis. Multiple sternal wire fractures noted. Aortic Atherosclerosis (ICD10-I70.0). Electronically Signed   By: Lowella Grip III M.D.   On: 02/26/2020 14:10   DG Lumbar Spine Complete  Result Date: 02/26/2020 CLINICAL DATA:  Back pain. EXAM: LUMBAR SPINE - COMPLETE 4+ VIEW COMPARISON:  MRI 03/08/2018. FINDINGS: Surgical clips right upper quadrant. Paraspinal soft tissues are normal. Diffuse degenerative change. No acute bony abnormality. No evidence of fracture. Aortoiliac atherosclerotic vascular calcification. IMPRESSION: Diffuse degenerative change.  No acute bony abnormality. Electronically Signed   By: Marcello Moores  Register   On: 02/26/2020 14:11   CARDIAC CATHETERIZATION  Result Date: 02/22/2020  [place]amus lesion is 40% stenosed.  Prox LAD lesion is 100% stenosed.  SVG graft was not visualized due to known occlusion.  Origin lesion is 100% stenosed.  SVG graft was visualized by angiography and is normal in caliber.  Prox RCA to Mid RCA lesion is 99% stenosed.  LIMA graft was visualized by angiography and is normal in caliber.  The left ventricular systolic function is normal.  LV end diastolic pressure is normal.  The left ventricular ejection fraction is 55-65% by visual estimate.  There is no mitral valve regurgitation.  1. Severe double vessel CAD s/p 3V CABG 2. The LAD is occluded proximally. The mid and distal LAD and diagonal branch fill from the patent LIMA graft. The vein graft to the diagonal branch is known to be occluded and was not selectively engaged today.  3. The Circumflex is small in caliber and has mild plaque disease. The intermediate branch is a moderate caliber vessel with mild proximal stenosis.  4. The RCA is previously stented in the mid segment. The mid stented segment has  diffuse 99% stenosis. The vein graft to the distal branches is patent. 5. Normal LV systolic function Recommendations: Continue medical management of CAD    Assessment and Plan:   NATHALY CHADWICK is a 54 y.o. y/o female has been referred for abdominal pain and recently underwent CT scan of the abdomen that showed possible thickening of the right colon .  She also has postprandial diarrhea gas and bloating.  Very likely she has a combination of issues likely related to excess consumption of diet Coke related diarrhea from the artificial sugars compounded by the fact she takes daily NSAIDs which might have caused some NSAID related colitis.  In addition she has acid reflux for many years with breakthrough symptoms.  Plan  1. Colonoscopy to evaluate abnormality seen on the CT scan.  EGD to screen for Barrett's esophagus.  Suggested to use Pepcid at night and a wedge pillow at night. 2.  Stop consumption of all diet soda, artificial sugars, NSAIDs.  If the pain subsides with this intervention then she can resume diet soda at a quantity that her body can handle. 3.  Levsin as needed.  I have discussed alternative options, risks & benefits,  which include, but are not limited to, bleeding, infection, perforation,respiratory complication & drug reaction.  The patient agrees with this plan & written consent will be obtained.     Follow up as needed  Dr Lisa Bellows MD,MRCP(U.K)

## 2020-03-03 NOTE — Telephone Encounter (Signed)
Left message to return call. ° °Mychart message sent  °

## 2020-03-04 ENCOUNTER — Other Ambulatory Visit: Payer: Self-pay

## 2020-03-04 DIAGNOSIS — K219 Gastro-esophageal reflux disease without esophagitis: Secondary | ICD-10-CM

## 2020-03-04 DIAGNOSIS — R9389 Abnormal findings on diagnostic imaging of other specified body structures: Secondary | ICD-10-CM

## 2020-03-09 ENCOUNTER — Telehealth: Payer: Self-pay

## 2020-03-09 ENCOUNTER — Ambulatory Visit: Payer: 59 | Admitting: Physician Assistant

## 2020-03-09 NOTE — Progress Notes (Signed)
Cardiology Office Note  Date: 03/10/2020   ID: [name], [name] 1966/09/28, MRN [id number]  PCP:  McLean-Scocuzza, Nino Glow, MD  Cardiologist:  Sherren Mocha, MD Electrophysiologist:  None   Chief Complaint: CAD s/p Cardiac catheterization . Needs medical and Pharmacy clearance for endoscopies.  History of Present Illness: [name] is a 54 y.o. female with a history of CAD, inferior MI PCI to RCA. CTO of LAD with multivessel CABG 2010. History of GERD, hypertension, hyperlipidemia.  Repeat cath in 2017 showed marked coronary artery disease with patent grafts to LAD and RCA.  SVG to diagonal occluded.  Previously complained of some shortness of breath with activity and some leg swelling improved with Lasix. Stress Myoview for chest pain 05/2018 showed no significant ischemia or scar. Low risk study.  History of tachycardia secondary to Graves' disease.  Recent  TSH (< 0.01), T4 (1.73) , T3 ( 3.2).  Beta-blocker was adjusted and heart rate was better controlled.   Blood pressure elevated on 02/02/2020 with symptoms of h/a, dizziness, nausea.  She was having mild chest discomfort. Losartan was increased to 100 mg daily.  Amlodipine 5 mg was started. She was advised via telephone to come in for annual follow up a little sooner per Dr Burt Knack.   She continued to complain of episodes of chest pain, neck pain, back pain, nausea, and diaphoresis with or without exertion for 2-3 weeks at last office visit. She stated these symptoms can become worse with exertion. Also having some atypical symptoms with throat discomfort. She mentioned that she knows it has been ten years since her bypass surgery and is concerned one of her bypass grafts may be occluded. She states she is tired of feeling bad and wants to find the cause of her symptoms. She states theses symptoms feel similar to the symptoms she was having prior to the bypass surgery in 2010.   Stated her BP was much better since increasing the  Losartan dosage and starting Amlodipine. Having much less dizziness and headaches since that time.   Cardiac catheterization done on 2.22.2021. There were no interventions performed. Medical management was recommended. See cardiac catheterization report below.  Patient states she was hypertensive after the cardiac catheterization.  States her blood pressure is much better today.  A little on the low side at 98/52 but she denies any symptoms of lightheadedness, dizziness, or orthostatic symptoms.  States she was having some lower extremity swelling after catheterization and has started taking her as needed Lasix more.  She denies any progressive anginal symptoms, palpitations or arrhythmias, stroke or TIA-like symptoms, dyspeptic symptoms, or blood in stool or urine.  Denies any claudication-like symptoms, DVT or PE-like symptoms, or lower extremity edema.  She has upcoming EGD and Colonoscopy and GI is asking for medical and pharmacological clearance. Requesting a 5 day hold on Plavix prior to procedures. These are scheduled on 03/25/2020.  Patient has been cleared from a cardiac standpoint by Dr. Burt Knack to stop Plavix for 5 days prior to EGD/colonoscopy per a telephone encounter this morning.  Patient has a revised cardiac risk index of 1 total points.  Her perioperative risk of major cardiac event is 0.9%.   Past Medical History:  Diagnosis Date  . ACUT MI ANTEROLAT WALL SUBSQT EPIS CARE   . Acute maxillary sinusitis   . ALLERGIC RHINITIS, SEASONAL   . ARTHRITIS, RHEUMATOID    shoulders and hands Enbrel>leg swelling Dr. Estanislado Pandy  . Atrial fibrillation ([place])  a. after CABG.  . Back pain   . Bruxism (teeth grinding)   . CAD, ARTERY BYPASS GRAFT    a. DES to RCA in 2010 then LAD occlusion s/p CABG 3 06/07/2009 with LIMA to LAD, reverse SVG to D1, reverse SVG to distal RCA. b. Cath 05/08/2016 slightly hypodense region in the intermediate branch, however she had excellent flow, FFR was normal. Vein  graft to PDA and the posterolateral branch is patent, patent LIMA to LAD, occluded SVG to diagonal.  . CARPAL TUNNEL SYNDROME, BILATERAL   . CHOLELITHIASIS   . Contrast media allergy   . DERMATITIS, ALLERGIC   . DIABETES MELLITUS, TYPE I    on insulin pump dx'ed age 44 y.o   . DIABETIC  RETINOPATHY   . Hiatal hernia   . HYPERLIPIDEMIA-MIXED   . HYPERTHYROIDISM    Dr. Dollene Cleveland  . Insulin pump in place   . MIGRAINE W/O AURA W/INTRACT W/STATUS MIGRAINOSUS 02/19/2008  . Psoriasis   . SINUS TACHYCARDIA 11/08/2010  . SVT (supraventricular tachycardia) ([facility])    after s/p CABG  . TRIGGER FINGER    all fingers b/l hands   . URI     Past Surgical History:  Procedure Laterality Date  . ABDOMINAL HYSTERECTOMY     endometriomas b/l; total ? cervix removed; no h/o abnormal pap  . Caesarean section    . CARDIAC CATHETERIZATION N/A 05/08/2016   Procedure: Left Heart Cath and Cors/Grafts Angiography;  Surgeon: Burnell Blanks, MD;  Location: [facility];  Service: Cardiovascular;  Laterality: N/A;  . CARPAL TUNNEL RELEASE     b/l   . CATARACT EXTRACTION, BILATERAL    . CHOLECYSTECTOMY    . CORONARY ARTERY BYPASS GRAFT    . EYE SURGERY     laser x 2 for retinopathy   . LEFT HEART CATH AND CORS/GRAFTS ANGIOGRAPHY N/A 02/22/2020   Procedure: LEFT HEART CATH AND CORS/GRAFTS ANGIOGRAPHY;  Surgeon: Burnell Blanks, MD;  Location: [facility];  Service: Cardiovascular;  Laterality: N/A;  . TRIGGER FINGER RELEASE     b/l fingers all   . VITRECTOMY     b/l     Current Outpatient Medications  Medication Sig Dispense Refill  . amLODipine (NORVASC) 5 MG tablet Take 1 tablet (5 mg total) by mouth daily. 30 tablet 11  . aspirin EC 81 MG tablet Take 81 mg by mouth daily.    . CHELATED MAGNESIUM PO Take 250 mg by mouth daily.    . Cholecalciferol (VITAMIN D) 125 MCG (5000 UT) CAPS Take 5,000 Units by mouth daily.    . clopidogrel (PLAVIX) 75 MG tablet TAKE 1 TABLET BY  MOUTH ONCE DAILY 90 tablet 2  . cyclobenzaprine (FLEXERIL) 10 MG tablet Take 1 tablet (10 mg total) by mouth at bedtime. 30 tablet 0  . Diclofenac Potassium (CAMBIA) 50 MG PACK Take 50 mg by mouth as needed. 12 each 11  . EPINEPHrine 0.3 mg/0.3 mL IJ SOAJ injection Inject 0.3 mLs (0.3 mg total) into the muscle as needed for anaphylaxis. 1 Device prn  . folic acid (FOLVITE) 1 MG tablet .AKE 2 TABLETS (2 MG TOTAL) BY MOUTH DAILY. 180 tablet 1  . furosemide (LASIX) 40 MG tablet Take 40 mg by mouth daily as needed for edema.     . Golimumab ([place] ARIA IV) Inject into the vein.    . hyoscyamine (LEVSIN) 0.125 MG tablet Take 1 tablet (0.125 mg total) by mouth 3 (three) times  daily as needed. 90 tablet 2  . insulin lispro (HUMALOG) 100 UNIT/ML injection Inject 80-90 Units into the skin continuous. FOR USE IN INSULIN PUMP. TOTAL DAILY INSULIN DOSE = UP TO 90 UNITS.    Marland Kitchen isosorbide mononitrate (IMDUR) 60 MG 24 hr tablet Take 1 tablet (60 mg total) by mouth daily. 30 tablet 11  . Krill Oil 500 MG CAPS Take 500 mg by mouth daily.    Marland Kitchen leflunomide (ARAVA) 20 MG tablet Take 1 tablet (20 mg total) by mouth daily. 90 tablet 0  . losartan (COZAAR) 100 MG tablet Take 1 tablet (100 mg total) by mouth daily. 30 tablet 11  . methimazole (TAPAZOLE) 10 MG tablet Take 5-10 mg by mouth See admin instructions. Take 10 mg by mouth in the morning and 5 mg in the afternoon    . metoprolol tartrate (LOPRESSOR) 50 MG tablet Take 1 tablet (50 mg total) by mouth 2 (two) times daily. 180 tablet 3  . montelukast (SINGULAIR) 10 MG tablet TAKE 1 TABLET BY MOUTH AT BEDTIME 30 tablet 2  . nabumetone (RELAFEN) 750 MG tablet TAKE 1 TABLET BY MOUTH TWICE DAILY 60 tablet 0  . nitroGLYCERIN (NITROSTAT) 0.4 MG SL tablet Place 1 tablet (0.4 mg total) under the tongue every 5 (five) minutes as needed. 25 tablet 3  . ondansetron (ZOFRAN ODT) 8 MG disintegrating tablet Take 1 tablet (8 mg total) by mouth 2 (two) times daily. 6 tablet 0  .  ONE TOUCH ULTRA TEST test strip   1  . pantoprazole (PROTONIX) 40 MG tablet Take 1 tablet (40 mg total) by mouth daily. 30 tablet 11  . potassium chloride (K-DUR) 10 MEQ tablet Take 1 tablet (10 mEq total) by mouth daily. 90 tablet 3  . promethazine (PHENERGAN) 25 MG tablet Take 1 tablet (25 mg total) by mouth every 6 (six) hours as needed for nausea or vomiting. 20 tablet 0  . rosuvastatin (CRESTOR) 20 MG tablet Take 1 tablet (20 mg total) by mouth daily. 90 tablet 3   Current Facility-Administered Medications  Medication Dose Route Frequency Provider Last Rate Last Admin  . sodium chloride flush (NS) 0.9 % injection 3 mL  3 mL Intravenous Q12H Verta Ellen., NP       Allergies:  Actemra [tocilizumab], Prochlorperazine, Ramipril, Shellfish-derived products, Atorvastatin, Compazine  [prochlorperazine edisylate], Emgality [galcanezumab-gnlm], Etanercept, Infliximab, Iohexol, Orencia [abatacept], Prochlorperazine edisylate, Shellfish allergy, Tofacitinib, Trokendi xr [topiramate er], Amiodarone, and Rituximab   Social History: The patient  reports that she has never smoked. She has never used smokeless tobacco. She reports current alcohol use. She reports that she does not use drugs.   Family History: The patient's family history includes Anxiety disorder in her mother; Colon polyps in her father; Depression in her mother and another family member; Diabetes in her father and another family member; Heart disease in an other family member; Hyperlipidemia in an other family member; Hypertension in her father and another family member; Migraines in an other family member; Parkinson's disease in her father; Stroke in her paternal grandmother.   ROS:  Please see the history of present illness. Otherwise, complete review of systems is positive for none.  All other systems are reviewed and negative.   Physical Exam: VS:  BP (!) 98/52   Pulse 61   Ht 5\' 3"  (1.6 m)   Wt 170 lb (77.1 kg)   SpO2 97%    BMI 30.11 kg/m , BMI Body mass index is 30.11 kg/m.  Wt Readings from Last 3 Encounters:  03/10/20 170 lb (77.1 kg)  03/03/20 170 lb 9.6 oz (77.4 kg)  02/22/20 168 lb (76.2 kg)    General: Patient appears comfortable at rest. Neck: Supple, no elevated JVP or carotid bruits, no thyromegaly. Lungs: Clear to auscultation, nonlabored breathing at rest. Cardiac: Regular rate and rhythm, no S3 or significant systolic murmur, no pericardial rub. Extremities: No pitting edema, distal pulses 2+. Skin: Warm and dry. Right groin access clean and healed Musculoskeletal: No kyphosis. Neuropsychiatric: Alert and oriented x3, affect grossly appropriate.  ECG:  None today  Recent Labwork: 11/17/2019: ALT 21; AST 24 01/04/2020: TSH <0.01 Repeated and verified X2. 02/15/2020: BUN 9; Creatinine, Ser 0.80; Hemoglobin 13.4; Platelets 172; Potassium 4.4; Sodium 140     Component Value Date/Time   CHOL 166 01/04/2020 0937   TRIG 151.0 (H) 01/04/2020 0937   HDL 78.10 01/04/2020 0937   CHOLHDL 2 01/04/2020 0937   VLDL 30.2 01/04/2020 0937   LDLCALC 58 01/04/2020 0937    Other Studies Reviewed Today:  Cardiac catheterization 02/22/2020   Ramus lesion is 40% stenosed.  Prox LAD lesion is 100% stenosed.  SVG graft was not visualized due to known occlusion.  Origin lesion is 100% stenosed.  SVG graft was visualized by angiography and is normal in caliber.  Prox RCA to Mid RCA lesion is 99% stenosed.  LIMA graft was visualized by angiography and is normal in caliber.  The left ventricular systolic function is normal.  LV end diastolic pressure is normal.  The left ventricular ejection fraction is 55-65% by visual estimate.  There is no mitral valve regurgitation.   1. Severe double vessel CAD s/p 3V CABG 2. The LAD is occluded proximally. The mid and distal LAD and diagonal branch fill from the patent LIMA graft. The vein graft to the diagonal branch is known to be occluded and was not  selectively engaged today.   3. The Circumflex is small in caliber and has mild plaque disease. The intermediate branch is a moderate caliber vessel with mild proximal stenosis.   4. The RCA is previously stented in the mid segment. The mid stented segment has diffuse 99% stenosis. The vein graft to the distal branches is patent.  5. Normal LV systolic function  Recommendations: Continue medical management of CAD  Diagnostic Dominance: Right  Intervention       Echocardiogram March 11, 2019 IMPRESSIONS  1. The left ventricle has normal systolic function with an ejection fraction of 60-65%. The cavity size was normal. Left ventricular diastolic Doppler parameters are consistent with impaired relaxation. Indeterminate filling pressures.  2. The right ventricle has normal systolic function. The cavity was normal. There is no increase in right ventricular wall thickness.    Lexiscan Myoview stress test 05/2018  Probable normal perfusion and soft tissue attenuation Diaphragm No significant ischemia or scar  Nuclear stress EF: 62%.  This is a low risk study.  Cardiac catheterization May 08, 2016 1. Severe double vessel CAD s/p 3V CABG 2. The LAD has diffuse severe proximal stenosis. There is both antegrade flow down the LAD and retrograde filling from the patent LIMA graft. The Diagonal that was supplied by the vein graft is small in caliber. The vein graft to the diagonal is occluded.  3. The Circumflex is small in caliber and has mild plaque disease. The intermediate branch is large in caliber. The intermediate branch has a proximal 30% stenosis, slightly hypodense in appearance but with excellent flow down  the vessel. (FFR of this vessel is 0.91 with infusion of adenosine suggesting no significant flow obstruction). It is unclear if this represents a non-flow limiting plaque rupture or hypodense calcific lesion.  4. The RCA is previously stented in the mid segment. The mid stented  segment is completely occluded. The vein graft to the PDA and posterolateral branches is patent.   Recommendations: She presented with continuous chest pain for 48 hours with no objective evidence of ischemia (negative troponin, no ischemic EKG changes). No culprit lesion is noted. As above, the mildly hypodense plaque in the intermediate branch could represent plaque rupture without flow obstruction but FFR is in a normal range. I do not think this lesion is causing her chest pain given the excellent flow down the vessel. I will treat with ASA, Plavix in case of plaque rupture and will start Imdur. Consider GI related cause of her chest pain  Assessment and Plan:  1. CAD in native artery   2. Essential hypertension   3. Hyperlipidemia LDL goal <70    1. CAD in native artery Recent cardiac catheterization for continued complaints of anginal/exertional symptoms associated with neck pain and abdominal discomfort.  Status post bypass approximately 10 years ago.  Patient was concerned that one of her grass may be occluded or she may have new disease.  Cardiac cath performed on 02/22/2020. LAD was proximally occluded.  The mid and distal LAD and diagonal branch filled from the patent LIMA graft.  The vein graft to the diagonal branch is known to be occluded and was not selectively engaged during the cardiac catheterization circumflex was small in caliber and had mild plaque disease.  Intermediate branch is moderate caliber vessel with proximal mild stenosis.  RCA previously stented in the mid segment.  Mid stented segment has diffuse 99% stenosis the vein graft to the distal branches is patent.  Normal LV function continue medical management was recommended. Continue aspirin 81 mg p.o. daily, Plavix 75 mg daily, Imdur 60 mg daily, losartan 100 mg daily, metoprolol 50 mg p.o. twice daily, sublingual nitroglycerin.  Continue Crestor 20 mg daily   Patient has been cleared from a cardiac standpoint by Dr. Burt Knack  to stop Plavix for 5 days prior to EGD/colonoscopy per a telephone encounter this morning.  Patient has a revised cardiac risk index of 1 total points.  Her perioperative risk of major cardiac event is 0.9%.    2. Essential hypertension Blood pressure is low normal today.  Patient states she was hypertensive postop for a while but blood pressure is much better.  Patient is tolerating well blood pressure is 98/52.  Patient states she has been taking her as needed Lasix more often secondary to postop swelling.  This may have contributed to decrease in blood pressure.  Continue furosemide 40 mg as needed for swelling/shortness of breath.  Continue  3. Hyperlipidemia LDL goal <70 Continue Crestor 20 mg daily.  Recent lipid levels on 01/04/2020; TC 166 TRI G.  151, HDL 78, VLDL 30.2, LDL 58.    Medication Adjustments/Labs and Tests Ordered: Current medicines are reviewed at length with the patient today.  Concerns regarding medicines are outlined above.   Disposition: Follow-up with Dr Burt Knack in 3 months  Signed, Levell July, NP 03/10/2020 8:48 AM    [place]

## 2020-03-09 NOTE — Telephone Encounter (Signed)
   [facility] Pre-operative Risk Assessment    Request for surgical clearance:  1. What type of surgery is being performed? Upper and lower Endoscopy   2. When is this surgery scheduled? 03-25-20   3. What type of clearance is required (medical clearance vs. Pharmacy clearance to hold med vs. Both)?  Both  4. Are there any medications that need to be held prior to surgery and how long? Plavix 75 mg, 5 day hold   5. Practice name and name of physician performing surgery? [facility],   Dr. Jonathon Bellows   6. What is your office phone number [phone]    7.   What is your office fax number [phone]  8.   Anesthesia type (None, local, MAC, general) ?  General   Rushie Chestnut 03/09/2020, 3:59 PM  _________________________________________________________________   (provider comments below)

## 2020-03-10 ENCOUNTER — Encounter: Payer: Self-pay | Admitting: Internal Medicine

## 2020-03-10 ENCOUNTER — Encounter: Payer: Self-pay | Admitting: Physician Assistant

## 2020-03-10 ENCOUNTER — Ambulatory Visit (INDEPENDENT_AMBULATORY_CARE_PROVIDER_SITE_OTHER): Payer: 59 | Admitting: Internal Medicine

## 2020-03-10 ENCOUNTER — Other Ambulatory Visit: Payer: Self-pay

## 2020-03-10 ENCOUNTER — Ambulatory Visit: Payer: 59 | Admitting: Family Medicine

## 2020-03-10 VITALS — BP 98/52 | HR 61 | Ht 63.0 in | Wt 170.0 lb

## 2020-03-10 VITALS — BP 104/70 | HR 63 | Temp 96.9°F | Wt 171.0 lb

## 2020-03-10 DIAGNOSIS — I10 Essential (primary) hypertension: Secondary | ICD-10-CM

## 2020-03-10 DIAGNOSIS — K529 Noninfective gastroenteritis and colitis, unspecified: Secondary | ICD-10-CM

## 2020-03-10 DIAGNOSIS — M0609 Rheumatoid arthritis without rheumatoid factor, multiple sites: Secondary | ICD-10-CM | POA: Diagnosis not present

## 2020-03-10 DIAGNOSIS — E559 Vitamin D deficiency, unspecified: Secondary | ICD-10-CM

## 2020-03-10 DIAGNOSIS — E103599 Type 1 diabetes mellitus with proliferative diabetic retinopathy without macular edema, unspecified eye: Secondary | ICD-10-CM | POA: Diagnosis not present

## 2020-03-10 DIAGNOSIS — G8929 Other chronic pain: Secondary | ICD-10-CM

## 2020-03-10 DIAGNOSIS — E0500 Thyrotoxicosis with diffuse goiter without thyrotoxic crisis or storm: Secondary | ICD-10-CM | POA: Diagnosis not present

## 2020-03-10 DIAGNOSIS — I2510 Atherosclerotic heart disease of native coronary artery without angina pectoris: Secondary | ICD-10-CM | POA: Diagnosis not present

## 2020-03-10 DIAGNOSIS — M549 Dorsalgia, unspecified: Secondary | ICD-10-CM | POA: Diagnosis not present

## 2020-03-10 DIAGNOSIS — R911 Solitary pulmonary nodule: Secondary | ICD-10-CM | POA: Diagnosis not present

## 2020-03-10 DIAGNOSIS — M5126 Other intervertebral disc displacement, lumbar region: Secondary | ICD-10-CM | POA: Diagnosis not present

## 2020-03-10 DIAGNOSIS — R1031 Right lower quadrant pain: Secondary | ICD-10-CM | POA: Diagnosis not present

## 2020-03-10 DIAGNOSIS — E785 Hyperlipidemia, unspecified: Secondary | ICD-10-CM | POA: Diagnosis not present

## 2020-03-10 MED ORDER — METHIMAZOLE 10 MG PO TABS: 15.0000 mg | ORAL_TABLET | Freq: Every day | ORAL | Status: DC

## 2020-03-10 MED ORDER — AMLODIPINE BESYLATE 5 MG PO TABS: 2.5000 mg | ORAL_TABLET | Freq: Every day | ORAL | 3 refills | Status: DC

## 2020-03-10 NOTE — Telephone Encounter (Signed)
Faxed to [facility].

## 2020-03-10 NOTE — Patient Instructions (Addendum)
Medication Instructions:  Your physician recommends that you continue on your current medications as directed. Please refer to the Current Medication list given to you today.  *If you need a refill on your cardiac medications before your next appointment, please call your pharmacy*   Lab Work: None ordered  If you have labs (blood work) drawn today and your tests are completely normal, you will receive your results only by: Marland Kitchen MyChart Message (if you have MyChart) OR . A paper copy in the mail If you have any lab test that is abnormal or we need to change your treatment, we will call you to review the results.   Testing/Procedures: None ordered   Follow-Up: At [facility], you and your health needs are our priority.  As part of our continuing mission to provide you with exceptional heart care, we have created designated Provider Care Teams.  These Care Teams include your primary Cardiologist (physician) and Advanced Practice Providers (APPs -  Physician Assistants and Nurse Practitioners) who all work together to provide you with the care you need, when you need it.  We recommend signing up for the patient portal called "MyChart".  Sign up information is provided on this After Visit Summary.  MyChart is used to connect with patients for Virtual Visits (Telemedicine).  Patients are able to view lab/test results, encounter notes, upcoming appointments, etc.  Non-urgent messages can be sent to your provider as well.   To learn more about what you can do with MyChart, go to [email or web address].    Your next appointment:   3 month(s)  Someone will call you with this.  The format for your next appointment:   Either In Person or Virtual  Provider:   You may see Sherren Mocha, MD or one of the following Advanced Practice Providers on your designated Care Team:    Richardson Dopp, PA-C  [place], [place]  Daune Perch, NP    Other Instructions

## 2020-03-10 NOTE — Progress Notes (Signed)
Chief Complaint  Patient presents with  . Follow-up  . Back Pain    Patient would like to discuss past imaging and pain management options    F/u  1. 6/10 Chronic back pain mid and low back with flare in the past had MRI low back in 2019 and PRP tx with Dr. Ace Gins in [facility] which helped previous imaging with disc bulging and Xrays recently with arthritis in mid and low back pain. Peppermint oil in the past has helped and another topical given by Dr. Ace Gins. Pain limits ADLS and is moderate to severe mid and low back  2. CAD w/o chest pain with CAD and aortoiliac atherosclerosis moderate managing medically per cards post cath in 2021 BP low normal today on norvasc 5 mg qd, lasix 40 mg qd, losartan 100 mg qd Imdur 60  Lasix has helped with sob   3. Constipation noted on CT ab/pelvis 02/2020 with colitis right sided will have EGD/colonoscopy 3/26 with Dr. Vicente Males she is now off nsaids due to colitis and with f/u rheum Dr.Deveshwar on other options for RA as off plaquenil as well due to eye changes  -of note she couldn't tolerate miralax so given samples of Trulance by GI now she is not having diarrhea but constipation and  RLQ ab pain where colitis is noted on imaging   4. DM 1 A1C 7.0 f/u endocrine seen 02/2020 she is considering viacyte stem cell research to see if this will cure DM 1 on her own  5. Graves dz with abnormal tfts seen endocrine 02/2020 and tfts rechecked 02/23/20 TSH 0.576 normal Ft4 1.03 normal Ft3 2.4 normal  She never had RAI tx's and wants to hold for now though discussed with endocrine in the past. He did increase methimazole to 15 mg qd -for #5 and 6 she will f/u endocrien in 07/2020   6. cT ab/pelvis with incidental finding of 3 mm LLL nodule will w/u further disc with pt today    Review of Systems  Constitutional: Positive for malaise/fatigue. Negative for weight loss.  HENT: Negative for hearing loss.   Respiratory: Negative for shortness of breath.   Cardiovascular: Negative  for chest pain.  Gastrointestinal: Positive for abdominal pain and constipation. Negative for diarrhea.  Musculoskeletal: Negative for falls.  Skin: Negative for rash.  Psychiatric/Behavioral: Negative for depression.   Past Medical History:  Diagnosis Date  . ACUT MI ANTEROLAT WALL SUBSQT EPIS CARE   . Acute maxillary sinusitis   . ALLERGIC RHINITIS, SEASONAL   . ARTHRITIS, RHEUMATOID    shoulders and hands Enbrel>leg swelling Dr. Estanislado Pandy  . Atrial fibrillation ([place])    a. after CABG.  . Back pain   . Bruxism (teeth grinding)   . CAD, ARTERY BYPASS GRAFT    a. DES to RCA in 2010 then LAD occlusion s/p CABG 3 06/07/2009 with LIMA to LAD, reverse SVG to D1, reverse SVG to distal RCA. b. Cath 05/08/2016 slightly hypodense region in the intermediate branch, however she had excellent flow, FFR was normal. Vein graft to PDA and the posterolateral branch is patent, patent LIMA to LAD, occluded SVG to diagonal.  . CARPAL TUNNEL SYNDROME, BILATERAL   . CHOLELITHIASIS   . Contrast media allergy   . DERMATITIS, ALLERGIC   . DIABETES MELLITUS, TYPE I    on insulin pump dx'ed age 74 y.o   . DIABETIC  RETINOPATHY   . Hiatal hernia   . HYPERLIPIDEMIA-MIXED   . HYPERTHYROIDISM    Dr. Steffanie Dunn  [facility]  . Insulin pump in place   . MIGRAINE W/O AURA W/INTRACT W/STATUS MIGRAINOSUS 02/19/2008  . Psoriasis   . SINUS TACHYCARDIA 11/08/2010  . SVT (supraventricular tachycardia) (HCC)    after s/p CABG  . TRIGGER FINGER    all fingers b/l hands   . URI    Past Surgical History:  Procedure Laterality Date  . ABDOMINAL HYSTERECTOMY     endometriomas b/l; total ? cervix removed; no h/o abnormal pap  . Caesarean section    . CARDIAC CATHETERIZATION N/A 05/08/2016   Procedure: Left Heart Cath and Cors/Grafts Angiography;  Surgeon: Burnell Blanks, MD;  Location: [facility];  Service: Cardiovascular;  Laterality: N/A;  . CARPAL TUNNEL RELEASE     b/l   . CATARACT EXTRACTION, BILATERAL      . CHOLECYSTECTOMY    . CORONARY ARTERY BYPASS GRAFT    . EYE SURGERY     laser x 2 for retinopathy   . LEFT HEART CATH AND CORS/GRAFTS ANGIOGRAPHY N/A 02/22/2020   Procedure: LEFT HEART CATH AND CORS/GRAFTS ANGIOGRAPHY;  Surgeon: Burnell Blanks, MD;  Location: [facility];  Service: Cardiovascular;  Laterality: N/A;  . TRIGGER FINGER RELEASE     b/l fingers all   . VITRECTOMY     b/l    Family History  Problem Relation Age of Onset  . Depression Mother   . Anxiety disorder Mother   . Colon polyps Father   . Diabetes Father        2  . Hypertension Father   . Parkinson's disease Father   . Diabetes Other   . Heart disease Other   . Hypertension Other   . Hyperlipidemia Other   . Depression Other   . Migraines Other   . Stroke Paternal Grandmother   . Heart attack Neg Hx   . Colon cancer Neg Hx   . Stomach cancer Neg Hx   . Esophageal cancer Neg Hx    Social History   Socioeconomic History  . Marital status: Divorced    Spouse name: Not on file  . Number of children: Not on file  . Years of education: Not on file  . Highest education level: Not on file  Occupational History  . Not on file  Tobacco Use  . Smoking status: Never Smoker  . Smokeless tobacco: Never Used  Substance and Sexual Activity  . Alcohol use: Yes    Comment: rarely 1 every 6 months  . Drug use: No  . Sexual activity: Not Currently    Partners: Male    Birth control/protection: None  Other Topics Concern  . Not on file  Social History Narrative   Divorced. Has 2 kids(fraternal twins) daughters age 36. Works at [employer], Never smoked, denies ETOH, no drugs. Drinks diet coke. No exercise.       DPR daughters [name] and Jessly Stalzer twins          Social Determinants of Health   Financial Resource Strain:   . Difficulty of Paying Living Expenses:   Food Insecurity:   . Worried About Charity fundraiser in the Last Year:   . Arboriculturist in the Last Year:    Transportation Needs:   . Film/video editor (Medical):   Marland Kitchen Lack of Transportation (Non-Medical):   Physical Activity:   . Days of Exercise per Week:   . Minutes of Exercise per Session:   Stress:   .  Feeling of Stress :   Social Connections:   . Frequency of Communication with Friends and Family:   . Frequency of Social Gatherings with Friends and Family:   . Attends Religious Services:   . Active Member of Clubs or Organizations:   . Attends Archivist Meetings:   Marland Kitchen Marital Status:   Intimate Partner Violence:   . Fear of Current or Ex-Partner:   . Emotionally Abused:   Marland Kitchen Physically Abused:   . Sexually Abused:    Current Meds  Medication Sig  . amLODipine (NORVASC) 5 MG tablet Take 0.5 tablets (2.5 mg total) by mouth daily.  Marland Kitchen aspirin EC 81 MG tablet Take 81 mg by mouth daily.  . CHELATED MAGNESIUM PO Take 250 mg by mouth daily.  . Cholecalciferol (VITAMIN D) 125 MCG (5000 UT) CAPS Take 5,000 Units by mouth daily.  . clopidogrel (PLAVIX) 75 MG tablet TAKE 1 TABLET BY MOUTH ONCE DAILY  . cyclobenzaprine (FLEXERIL) 10 MG tablet Take 1 tablet (10 mg total) by mouth at bedtime.  . Diclofenac Potassium (CAMBIA) 50 MG PACK Take 50 mg by mouth as needed.  Marland Kitchen EPINEPHrine 0.3 mg/0.3 mL IJ SOAJ injection Inject 0.3 mLs (0.3 mg total) into the muscle as needed for anaphylaxis.  . folic acid (FOLVITE) 1 MG tablet .AKE 2 TABLETS (2 MG TOTAL) BY MOUTH DAILY.  . furosemide (LASIX) 40 MG tablet Take 40 mg by mouth daily as needed for edema.   . Golimumab ([place] ARIA IV) Inject into the vein.  . hyoscyamine (LEVSIN) 0.125 MG tablet Take 1 tablet (0.125 mg total) by mouth 3 (three) times daily as needed.  . insulin lispro (HUMALOG) 100 UNIT/ML injection Inject 80-90 Units into the skin continuous. FOR USE IN INSULIN PUMP. TOTAL DAILY INSULIN DOSE = UP TO 90 UNITS.  Marland Kitchen isosorbide mononitrate (IMDUR) 60 MG 24 hr tablet Take 1 tablet (60 mg total) by mouth daily.  Javier Docker Oil 500  MG CAPS Take 500 mg by mouth daily.  Marland Kitchen leflunomide (ARAVA) 20 MG tablet Take 1 tablet (20 mg total) by mouth daily.  Marland Kitchen losartan (COZAAR) 100 MG tablet Take 1 tablet (100 mg total) by mouth daily.  . methimazole (TAPAZOLE) 10 MG tablet Take 1.5 tablets (15 mg total) by mouth daily. Take 10 mg by mouth in the morning and 5 mg in the afternoon  . metoprolol tartrate (LOPRESSOR) 50 MG tablet Take 1 tablet (50 mg total) by mouth 2 (two) times daily.  . montelukast (SINGULAIR) 10 MG tablet TAKE 1 TABLET BY MOUTH AT BEDTIME  . nabumetone (RELAFEN) 750 MG tablet TAKE 1 TABLET BY MOUTH TWICE DAILY  . nitroGLYCERIN (NITROSTAT) 0.4 MG SL tablet Place 1 tablet (0.4 mg total) under the tongue every 5 (five) minutes as needed.  . ondansetron (ZOFRAN ODT) 8 MG disintegrating tablet Take 1 tablet (8 mg total) by mouth 2 (two) times daily.  . ONE TOUCH ULTRA TEST test strip   . pantoprazole (PROTONIX) 40 MG tablet Take 1 tablet (40 mg total) by mouth daily.  . potassium chloride (K-DUR) 10 MEQ tablet Take 1 tablet (10 mEq total) by mouth daily.  . promethazine (PHENERGAN) 25 MG tablet Take 1 tablet (25 mg total) by mouth every 6 (six) hours as needed for nausea or vomiting.  . rosuvastatin (CRESTOR) 20 MG tablet Take 1 tablet (20 mg total) by mouth daily.  . [DISCONTINUED] amLODipine (NORVASC) 5 MG tablet Take 1 tablet (5 mg total) by mouth daily.  . [  DISCONTINUED] methimazole (TAPAZOLE) 10 MG tablet Take 5-10 mg by mouth See admin instructions. Take 10 mg by mouth in the morning and 5 mg in the afternoon   Allergies  Allergen Reactions  . Actemra [Tocilizumab]   . Prochlorperazine Rash    Neuro problems per pt  . Ramipril Swelling, Rash and Other (See Comments)  . Shellfish-Derived Products Swelling    Shrimp   . Atorvastatin Rash    Elevated LFT's  . Compazine  [Prochlorperazine Edisylate] Other (See Comments)    Neurological reaction  . Emgality [Galcanezumab-Gnlm] Hives and Swelling  . Etanercept  Swelling and Rash  . Infliximab Rash  . Iohexol     Iv contrast dye -rash all over  . Orencia [Abatacept] Rash  . Prochlorperazine Edisylate     unknown  . Shellfish Allergy Swelling    Shrimp(Facial swelling)  . Tofacitinib Rash and Other (See Comments)    Severe abdominal pain   . [employer]]     [employer], memory issues, word finding issues  . Amiodarone Nausea Only  . Rituximab Rash    Causes a rash   Recent Results (from the past 2160 hour(s))  Vitamin D (25 hydroxy)     Status: Abnormal   Collection Time: 01/04/20  9:37 AM  Result Value Ref Range   VITD 26.29 (L) 30.00 - 100.00 ng/mL  Thyroid peroxidase antibody     Status: Abnormal   Collection Time: 01/04/20  9:37 AM  Result Value Ref Range   Thyroperoxidase Ab SerPl-aCnc 284 (H) <9 IU/mL  HgB A1c     Status: Abnormal   Collection Time: 01/04/20  9:37 AM  Result Value Ref Range   Hgb A1c MFr Bld 7.0 (H) 4.6 - 6.5 %    Comment: Glycemic Control Guidelines for People with Diabetes:Non Diabetic:  <6%Goal of Therapy: <7%Additional Action Suggested:  >8%   T3, free     Status: None   Collection Time: 01/04/20  9:37 AM  Result Value Ref Range   T3, Free 3.2 2.3 - 4.2 pg/mL  T4, free     Status: Abnormal   Collection Time: 01/04/20  9:37 AM  Result Value Ref Range   Free T4 1.73 (H) 0.60 - 1.60 ng/dL    Comment: Specimens from patients who are undergoing biotin therapy and /or ingesting biotin supplements may contain high levels of biotin.  The higher biotin concentration in these specimens interferes with this Free T4 assay.  Specimens that contain high levels  of biotin may cause false high results for this Free T4 assay.  Please interpret results in light of the total clinical presentation of the patient.    TSH     Status: Abnormal   Collection Time: 01/04/20  9:37 AM  Result Value Ref Range   TSH <0.01 Repeated and verified X2. (L) 0.35 - 4.50 uIU/mL  Lipid panel     Status: Abnormal   Collection  Time: 01/04/20  9:37 AM  Result Value Ref Range   Cholesterol 166 0 - 200 mg/dL    Comment: ATP III Classification       Desirable:  < 200 mg/dL               Borderline High:  200 - 239 mg/dL          High:  > = 240 mg/dL   Triglycerides 151.0 (H) 0.0 - 149.0 mg/dL    Comment: Normal:  <150 mg/dLBorderline High:  150 - 199  mg/dL   HDL 78.10 >39.00 mg/dL   VLDL 30.2 0.0 - 40.0 mg/dL   LDL Cholesterol 58 0 - 99 mg/dL   Total CHOL/HDL Ratio 2     Comment:                Men          Women1/2 Average Risk     3.4          3.3Average Risk          5.0          4.42X Average Risk          9.6          7.13X Average Risk          15.0          11.0                       NonHDL 87.77     Comment: NOTE:  Non-HDL goal should be 30 mg/dL higher than patient's LDL goal (i.e. LDL goal of < 70 mg/dL, would have non-HDL goal of < 100 mg/dL)  HM DIABETES EYE EXAM     Status: None   Collection Time: 01/06/20  3:52 PM  Result Value Ref Range   HM Diabetic Eye Exam    Urinalysis, Complete w Microscopic     Status: Abnormal   Collection Time: 02/07/20 10:05 AM  Result Value Ref Range   Color, Urine YELLOW YELLOW   APPearance CLEAR CLEAR   Specific Gravity, Urine <1.005 (L) 1.005 - 1.030   pH 5.5 5.0 - 8.0   Glucose, UA NEGATIVE NEGATIVE mg/dL   Hgb urine dipstick NEGATIVE NEGATIVE   Bilirubin Urine NEGATIVE NEGATIVE   Ketones, ur NEGATIVE NEGATIVE mg/dL   Protein, ur NEGATIVE NEGATIVE mg/dL   Nitrite NEGATIVE NEGATIVE   Leukocytes,Ua NEGATIVE NEGATIVE   Squamous Epithelial / LPF 0-5 0 - 5   WBC, UA 0-5 0 - 5 WBC/hpf   RBC / HPF NONE SEEN 0 - 5 RBC/hpf   Bacteria, UA FEW (A) NONE SEEN    Comment: Performed at [facility], [address], [place], [place] [address]  Novel Coronavirus, NAA (Hosp order, Send-out to Ref Lab; TAT 18-24 hrs     Status: None   Collection Time: 02/07/20 10:13 AM   Specimen: Nasopharyngeal Swab; Respiratory  Result Value Ref Range   SARS-CoV-2, NAA NOT  DETECTED NOT DETECTED    Comment: (NOTE) This nucleic acid amplification test was developed and its performance characteristics determined by [employer], [employer] and [employer]. Nucleic acid amplification tests include RT-PCR and TMA. This test has not been FDA cleared or approved. This test has been authorized by FDA under an Emergency Use Authorization (EUA). This test is only authorized for the duration of time the declaration that circumstances exist justifying the authorization of the emergency use of in vitro diagnostic tests for detection of SARS-CoV-2 virus and/or diagnosis of COVID-19 infection under section 564(b)(1) of the Act, 21 U.S.C. [id number]) (1), unless the authorization is terminated or revoked sooner. When diagnostic testing is negative, the possibility of a false negative result should be considered in the context of a patient's recent exposures and the presence of clinical signs and symptoms consistent with COVID-19. An individual without symptoms of COVID- 19 and who is not shedding SARS-CoV-2  virus would expect to have a negative (not detected) result in this assay. Performed At: [facility] [phone]  [place], [place] [id number] Rush Farmer MD [id number]    Coronavirus Source NASOPHARYNGEAL     Comment: Performed at [facility], [address], [place], [place] [address]  Basic metabolic panel     Status: Abnormal   Collection Time: 02/15/20  4:56 PM  Result Value Ref Range   Glucose 224 (H) 65 - 99 mg/dL   BUN 9 6 - 24 mg/dL   Creatinine, Ser 0.80 0.57 - 1.00 mg/dL   GFR calc non Af Amer 85 >59 mL/min/1.73   GFR calc Af Amer 98 >59 mL/min/1.73   BUN/Creatinine Ratio 11 9 - 23   Sodium 140 134 - 144 mmol/L   Potassium 4.4 3.5 - 5.2 mmol/L   Chloride 103 96 - 106 mmol/L   CO2 21 20 - 29 mmol/L   Calcium 9.3 8.7 - 10.2 mg/dL  CBC     Status: None   Collection Time: 02/15/20  4:56 PM  Result Value Ref Range   WBC 4.9 3.4 - 10.8  x10E3/uL   RBC 4.47 3.77 - 5.28 x10E6/uL   Hemoglobin 13.4 11.1 - 15.9 g/dL   Hematocrit 38.9 34.0 - 46.6 %   MCV 87 79 - 97 fL   MCH 30.0 26.6 - 33.0 pg   MCHC 34.4 31.5 - 35.7 g/dL   RDW 12.8 11.7 - 15.4 %   Platelets 172 150 - 450 x10E3/uL  SARS CORONAVIRUS 2 (TAT 6-24 HRS) Nasopharyngeal Nasopharyngeal Swab     Status: None   Collection Time: 02/19/20 10:33 AM   Specimen: Nasopharyngeal Swab  Result Value Ref Range   SARS Coronavirus 2 NEGATIVE NEGATIVE    Comment: (NOTE) SARS-CoV-2 target nucleic acids are NOT DETECTED. The SARS-CoV-2 RNA is generally detectable in upper and lower respiratory specimens during the acute phase of infection. Negative results do not preclude SARS-CoV-2 infection, do not rule out co-infections with other pathogens, and should not be used as the sole basis for treatment or other patient management decisions. Negative results must be combined with clinical observations, patient history, and epidemiological information. The expected result is Negative. Fact Sheet for Patients: SugarRoll.be Fact Sheet for Healthcare Providers: https://www.woods-mathews.com/ This test is not yet approved or cleared by the [place] FDA and  has been authorized for detection and/or diagnosis of SARS-CoV-2 by FDA under an Emergency Use Authorization (EUA). This EUA will remain  in effect (meaning this test can be used) for the duration of the COVID-19 declaration under Section 56 4(b)(1) of the Act, 21 U.S.C. section 360bbb-3(b)(1), unless the authorization is terminated or revoked sooner. Performed at [facility], [place] [address] [place] [address], [place], [place] [address]   Glucose, capillary     Status: Abnormal   Collection Time: 02/22/20  6:25 AM  Result Value Ref Range   Glucose-Capillary 142 (H) 70 - 99 mg/dL  Glucose, capillary     Status: None   Collection Time: 02/22/20  8:41 AM  Result Value Ref Range    Glucose-Capillary 87 70 - 99 mg/dL  Glucose, capillary     Status: Abnormal   Collection Time: 02/22/20 11:33 AM  Result Value Ref Range   Glucose-Capillary 266 (H) 70 - 99 mg/dL   Objective  Body mass index is 30.29 kg/m. Wt Readings from Last 3 Encounters:  03/10/20 171 lb (77.6 kg)  03/10/20 170 lb (77.1 kg)  03/03/20 170 lb 9.6 oz (77.4 kg)   Temp Readings from Last 3 Encounters:  03/10/20 (!) 96.9 F (36.1  C) (Temporal)  03/03/20 98.3 F (36.8 C)  02/22/20 (!) 97.5 F (36.4 C) (Skin)   BP Readings from Last 3 Encounters:  03/10/20 104/70  03/10/20 (!) 98/52  03/03/20 120/76   Pulse Readings from Last 3 Encounters:  03/10/20 63  03/10/20 61  03/03/20 64    Physical Exam Vitals and nursing note reviewed.  Constitutional:      Appearance: Normal appearance. She is well-developed and well-groomed. She is obese.  HENT:     Head: Normocephalic and atraumatic.  Eyes:     Conjunctiva/sclera: Conjunctivae normal.     Pupils: Pupils are equal, round, and reactive to light.  Cardiovascular:     Rate and Rhythm: Normal rate and regular rhythm.     Heart sounds: Normal heart sounds. No murmur.  Pulmonary:     Effort: Pulmonary effort is normal.     Breath sounds: Normal breath sounds.  Abdominal:     General: Abdomen is flat. Bowel sounds are normal.     Tenderness: There is abdominal tenderness in the right lower quadrant. There is rebound. There is no guarding.    Skin:    General: Skin is warm and dry.  Neurological:     General: No focal deficit present.     Mental Status: She is alert and oriented to person, place, and time. Mental status is at baseline.     Gait: Gait normal.  Psychiatric:        Attention and Perception: Attention and perception normal.        Mood and Affect: Mood and affect normal.        Speech: Speech normal.        Behavior: Behavior normal. Behavior is cooperative.        Thought Content: Thought content normal.        Cognition  and Memory: Cognition and memory normal.        Judgment: Judgment normal.     Assessment  Plan  Essential hypertension - Plan: amLODipine (NORVASC) 2.5 MG tablet reduce dose since BP running low normal  Cont other meds   Graves disease - Plan: methimazole (TAPAZOLE) 15 MG tablet f/u endocrien 07/2020   Lumbar herniated disc - Plan: Ambulatory referral to Physical Medicine Rehab Chronic back pain, unspecified back location, unspecified back pain laterality - Plan: Ambulatory referral to Physical Medicine Rehab  Lung nodule - Plan: CT Chest Wo Contrast   RA off plaquenil due to eye side effects, off nsaids due to colitis  CC Dr. Estanislado Pandy and reply  I was not in the favor of NSAIDs to start with as patient is diabetic and also on Plavix. She was advised to take Tylenol. The only other option will be to refer her to pain management. She would prefer to stay on Tylenol for RA  Colitis with RLQ ab pain -EGD colonoscopy sch 03/25/20 Dr. Vicente Males  HM  Flu shot utd pna 23 utd  Tdap utd covid vx had 2/2  Consider shingrix in future  mammo 11/11/19 negative  DEXA 11/11/19 normal  Colonoscopy 08/30/17 normal Dr. Silvio Pate h/o colitis, 2021 established Dr. Vicente Males colonoscopy EGD sch 03/25/20  Pap consider in the future  SPEP abnormal consider nephrotic proteinuria consider renal in future 10/15/2019 no abnormal bands but low urine creatinine and protein   Endocrine [facility] Dr. Steffanie Dunn appt 02/19/20 due to f/u 07/2020  Cards Dr. Gerrit Halls seen 03/10/20  [facility]-migraines  Dermatology Dr. Laurence Ferrari saw 12/2019 Rheumatology Dr. Estanislado Pandy  Hand surgery Dr.  Kuzma  Provider: Dr. Olivia Mackie McLean-Scocuzza-Internal Medicine

## 2020-03-10 NOTE — Patient Instructions (Addendum)
Lidocaine/salonpas pain patch  Dr. Sharlet Salina   Thoracic Strain Rehab Ask your health care provider which exercises are safe for you. Do exercises exactly as told by your health care provider and adjust them as directed. It is normal to feel mild stretching, pulling, tightness, or discomfort as you do these exercises. Stop right away if you feel sudden pain or your pain gets worse. Do not begin these exercises until told by your health care provider. Stretching and range-of-motion exercise This exercise warms up your muscles and joints and improves the movement and flexibility of your back and shoulders. This exercise also helps to relieve pain. Chest and spine stretch  1. Lie down on your back on a firm surface. 2. Roll a towel or a small blanket so it is about 4 inches (10 cm) in diameter. 3. Put the towel lengthwise under the middle of your back so it is under your spine, but not under your shoulder blades. 4. Put your hands behind your head and let your elbows fall to your sides. This will increase your stretch. 5. Take a deep breath (inhale). 6. Hold for __________ seconds. 7. Relax after you breathe out (exhale). Repeat __________ times. Complete this exercise __________ times a day. Strengthening exercises These exercises build strength and endurance in your back and your shoulder blade muscles. Endurance is the ability to use your muscles for a long time, even after they get tired. Alternating arm and leg raises  1. Get on your hands and knees on a firm surface. If you are on a hard floor, you may want to use padding, such as an exercise mat, to cushion your knees. 2. Line up your arms and legs. Your hands should be directly below your shoulders, and your knees should be directly below your hips. 3. Lift your left leg behind you. At the same time, raise your right arm and straighten it in front of you. ? Do not lift your leg higher than your hip. ? Do not lift your arm higher than your  shoulder. ? Keep your abdominal and back muscles tight. ? Keep your hips facing the ground. ? Do not arch your back. ? Keep your balance carefully, and do not hold your breath. 4. Hold for __________ seconds. 5. Slowly return to the starting position and repeat with your right leg and your left arm. Repeat __________ times. Complete this exercise __________ times a day. Straight arm rows This exercise is also called shoulder extension exercise. 1. Stand with your feet shoulder width apart. 2. Secure an exercise band to a stable object in front of you so the band is at or above shoulder height. 3. Hold one end of the exercise band in each hand. 4. Straighten your elbows and lift your hands up to shoulder height. 5. Step back, away from the secured end of the exercise band, until the band stretches. 6. Squeeze your shoulder blades together and pull your hands down to the sides of your thighs. Stop when your hands are straight down by your sides. This is shoulder extension. Do not let your hands go behind your body. 7. Hold for __________ seconds. 8. Slowly return to the starting position. Repeat __________ times. Complete this exercise __________ times a day. Prone shoulder external rotation 1. Lie on your abdomen on a firm bed so your left / right forearm hangs over the edge of the bed and your upper arm is on the bed, straight out from your body. This is the prone  position. ? Your elbow should be bent. ? Your palm should be facing your feet. 2. If instructed, hold a __________ weight in your hand. 3. Squeeze your shoulder blade toward the middle of your back. Do not let your shoulder lift toward your ear. 4. Keep your elbow bent in a 90-degree angle (right angle) while you slowly move your forearm up toward the ceiling. Move your forearm up to the height of the bed, toward your head. This is external rotation. ? Your upper arm should not move. ? At the top of the movement, your palm should  face the floor. 5. Hold for __________ seconds. 6. Slowly return to the starting position and relax your muscles. Repeat __________ times. Complete this exercise __________ times a day. Rowing scapular retraction This is an exercise in which the shoulder blades (scapulae) are pulled toward each other (retraction). 1. Sit in a stable chair without armrests, or stand up. 2. Secure an exercise band to a stable object in front of you so the band is at shoulder height. 3. Hold one end of the exercise band in each hand. Your palms should face down. 4. Bring your arms out straight in front of you. 5. Step back, away from the secured end of the exercise band, until the band stretches. 6. Pull the band backward. As you do this, bend your elbows and squeeze your shoulder blades together, but avoid letting the rest of your body move. Do not shrug your shoulders upward while you do this. 7. Stop when your elbows are at your sides or slightly behind your body. 8. Hold for __________ seconds. 9. Slowly straighten your arms to return to the starting position. Repeat __________ times. Complete this exercise __________ times a day. Posture and body mechanics Good posture and healthy body mechanics can help to relieve stress in your body's tissues and joints. Body mechanics refers to the movements and positions of your body while you do your daily activities. Posture is part of body mechanics. Good posture means:  Your spine is in its natural S-curve position (neutral).  Your shoulders are pulled back slightly.  Your head is not tipped forward. Follow these guidelines to improve your posture and body mechanics in your everyday activities. Standing   When standing, keep your spine neutral and your feet about hip width apart. Keep a slight bend in your knees. Your ears, shoulders, and hips should line up with each other.  When you do a task in which you lean forward while standing in one place for a long  time, place one foot up on a stable object that is 2-4 inches (5-10 cm) high, such as a footstool. This helps keep your spine neutral. Sitting   When sitting, keep your spine neutral and keep your feet flat on the floor. Use a footrest, if necessary, and keep your thighs parallel to the floor. Avoid rounding your shoulders, and avoid tilting your head forward.  When working at a desk or a computer, keep your desk at a height where your hands are slightly lower than your elbows. Slide your chair under your desk so you are close enough to maintain good posture.  When working at a computer, place your monitor at a height where you are looking straight ahead and you do not have to tilt your head forward or downward to look at the screen. Resting When lying down and resting, avoid positions that are most painful for you.  If you have pain with activities such  as sitting, bending, stooping, or squatting (flexion-basedactivities), lie in a position in which your body does not bend very much. For example, avoid curling up on your side with your arms and knees near your chest (fetal position).  If you have pain with activities such as standing for a long time or reaching with your arms (extension-basedactivities), lie with your spine in a neutral position and bend your knees slightly. Try the following positions: ? Lie on your side with a pillow between your knees. ? Lie on your back with a pillow under your knees.  Lifting   When lifting objects, keep your feet at least shoulder width apart and tighten your abdominal muscles.  Bend your knees and hips and keep your spine neutral. It is important to lift using the strength of your legs, not your back. Do not lock your knees straight out.  Always ask for help to lift heavy or awkward objects. This information is not intended to replace advice given to you by your health care provider. Make sure you discuss any questions you have with your health care  provider. Document Revised: 04/10/2019 Document Reviewed: 01/26/2019 Elsevier Patient Education  [place] or Strain Rehab Ask your health care provider which exercises are safe for you. Do exercises exactly as told by your health care provider and adjust them as directed. It is normal to feel mild stretching, pulling, tightness, or discomfort as you do these exercises. Stop right away if you feel sudden pain or your pain gets worse. Do not begin these exercises until told by your health care provider. Stretching and range-of-motion exercises These exercises warm up your muscles and joints and improve the movement and flexibility of your back. These exercises also help to relieve pain, numbness, and tingling. Lumbar rotation  8. Lie on your back on a firm surface and bend your knees. 9. Straighten your arms out to your sides so each arm forms a 90-degree angle (right angle) with a side of your body. 10. Slowly move (rotate) both of your knees to one side of your body until you feel a stretch in your lower back (lumbar). Try not to let your shoulders lift off the floor. 11. Hold this position for __________ seconds. 12. Tense your abdominal muscles and slowly move your knees back to the starting position. 13. Repeat this exercise on the other side of your body. Repeat __________ times. Complete this exercise __________ times a day. Single knee to chest  6. Lie on your back on a firm surface with both legs straight. 7. Bend one of your knees. Use your hands to move your knee up toward your chest until you feel a gentle stretch in your lower back and buttock. ? Hold your leg in this position by holding on to the front of your knee. ? Keep your other leg as straight as possible. 8. Hold this position for __________ seconds. 9. Slowly return to the starting position. 10. Repeat with your other leg. Repeat __________ times. Complete this exercise __________ times a day.  Prone extension on elbows  9. Lie on your abdomen on a firm surface (prone position). 10. Prop yourself up on your elbows. 11. Use your arms to help lift your chest up until you feel a gentle stretch in your abdomen and your lower back. ? This will place some of your body weight on your elbows. If this is uncomfortable, try stacking pillows under your chest. ? Your hips should stay  down, against the surface that you are lying on. Keep your hip and back muscles relaxed. 12. Hold this position for __________ seconds. 13. Slowly relax your upper body and return to the starting position. Repeat __________ times. Complete this exercise __________ times a day. Strengthening exercises These exercises build strength and endurance in your back. Endurance is the ability to use your muscles for a long time, even after they get tired. Pelvic tilt This exercise strengthens the muscles that lie deep in the abdomen. 7. Lie on your back on a firm surface. Bend your knees and keep your feet flat on the floor. 8. Tense your abdominal muscles. Tip your pelvis up toward the ceiling and flatten your lower back into the floor. ? To help with this exercise, you may place a small towel under your lower back and try to push your back into the towel. 9. Hold this position for __________ seconds. 10. Let your muscles relax completely before you repeat this exercise. Repeat __________ times. Complete this exercise __________ times a day. Alternating arm and leg raises  10. Get on your hands and knees on a firm surface. If you are on a hard floor, you may want to use padding, such as an exercise mat, to cushion your knees. 11. Line up your arms and legs. Your hands should be directly below your shoulders, and your knees should be directly below your hips. 12. Lift your left leg behind you. At the same time, raise your right arm and straighten it in front of you. ? Do not lift your leg higher than your hip. ? Do not  lift your arm higher than your shoulder. ? Keep your abdominal and back muscles tight. ? Keep your hips facing the ground. ? Do not arch your back. ? Keep your balance carefully, and do not hold your breath. 13. Hold this position for __________ seconds. 14. Slowly return to the starting position. 15. Repeat with your right leg and your left arm. Repeat __________ times. Complete this exercise __________ times a day. Abdominal set with straight leg raise  1. Lie on your back on a firm surface. 2. Bend one of your knees and keep your other leg straight. 3. Tense your abdominal muscles and lift your straight leg up, 4-6 inches (10-15 cm) off the ground. 4. Keep your abdominal muscles tight and hold this position for __________ seconds. ? Do not hold your breath. ? Do not arch your back. Keep it flat against the ground. 5. Keep your abdominal muscles tense as you slowly lower your leg back to the starting position. 6. Repeat with your other leg. Repeat __________ times. Complete this exercise __________ times a day. Single leg lower with bent knees 1. Lie on your back on a firm surface. 2. Tense your abdominal muscles and lift your feet off the floor, one foot at a time, so your knees and hips are bent in 90-degree angles (right angles). ? Your knees should be over your hips and your lower legs should be parallel to the floor. 3. Keeping your abdominal muscles tense and your knee bent, slowly lower one of your legs so your toe touches the ground. 4. Lift your leg back up to return to the starting position. ? Do not hold your breath. ? Do not let your back arch. Keep your back flat against the ground. 5. Repeat with your other leg. Repeat __________ times. Complete this exercise __________ times a day. Posture and body mechanics Good posture and  healthy body mechanics can help to relieve stress in your body's tissues and joints. Body mechanics refers to the movements and positions of your  body while you do your daily activities. Posture is part of body mechanics. Good posture means:  Your spine is in its natural S-curve position (neutral).  Your shoulders are pulled back slightly.  Your head is not tipped forward. Follow these guidelines to improve your posture and body mechanics in your everyday activities. Standing   When standing, keep your spine neutral and your feet about hip width apart. Keep a slight bend in your knees. Your ears, shoulders, and hips should line up.  When you do a task in which you stand in one place for a long time, place one foot up on a stable object that is 2-4 inches (5-10 cm) high, such as a footstool. This helps keep your spine neutral. Sitting   When sitting, keep your spine neutral and keep your feet flat on the floor. Use a footrest, if necessary, and keep your thighs parallel to the floor. Avoid rounding your shoulders, and avoid tilting your head forward.  When working at a desk or a computer, keep your desk at a height where your hands are slightly lower than your elbows. Slide your chair under your desk so you are close enough to maintain good posture.  When working at a computer, place your monitor at a height where you are looking straight ahead and you do not have to tilt your head forward or downward to look at the screen. Resting  When lying down and resting, avoid positions that are most painful for you.  If you have pain with activities such as sitting, bending, stooping, or squatting, lie in a position in which your body does not bend very much. For example, avoid curling up on your side with your arms and knees near your chest (fetal position).  If you have pain with activities such as standing for a long time or reaching with your arms, lie with your spine in a neutral position and bend your knees slightly. Try the following positions: ? Lying on your side with a pillow between your knees. ? Lying on your back with a pillow  under your knees. Lifting   When lifting objects, keep your feet at least shoulder width apart and tighten your abdominal muscles.  Bend your knees and hips and keep your spine neutral. It is important to lift using the strength of your legs, not your back. Do not lock your knees straight out.  Always ask for help to lift heavy or awkward objects. This information is not intended to replace advice given to you by your health care provider. Make sure you discuss any questions you have with your health care provider. Document Revised: 04/10/2019 Document Reviewed: 01/08/2019 Elsevier Patient Education  [place].  Back Exercises The following exercises strengthen the muscles that help to support the trunk and back. They also help to keep the lower back flexible. Doing these exercises can help to prevent back pain or lessen existing pain.  If you have back pain or discomfort, try doing these exercises 2-3 times each day or as told by your health care provider.  As your pain improves, do them once each day, but increase the number of times that you repeat the steps for each exercise (do more repetitions).  To prevent the recurrence of back pain, continue to do these exercises once each day or as told by your  health care provider. Do exercises exactly as told by your health care provider and adjust them as directed. It is normal to feel mild stretching, pulling, tightness, or discomfort as you do these exercises, but you should stop right away if you feel sudden pain or your pain gets worse. Exercises Single knee to chest Repeat these steps 3-5 times for each leg: 1. Lie on your back on a firm bed or the floor with your legs extended. 2. Bring one knee to your chest. Your other leg should stay extended and in contact with the floor. 3. Hold your knee in place by grabbing your knee or thigh with both hands and hold. 4. Pull on your knee until you feel a gentle stretch in your lower  back or buttocks. 5. Hold the stretch for 10-30 seconds. 6. Slowly release and straighten your leg. Pelvic tilt Repeat these steps 5-10 times: 1. Lie on your back on a firm bed or the floor with your legs extended. 2. Bend your knees so they are pointing toward the ceiling and your feet are flat on the floor. 3. Tighten your lower abdominal muscles to press your lower back against the floor. This motion will tilt your pelvis so your tailbone points up toward the ceiling instead of pointing to your feet or the floor. 4. With gentle tension and even breathing, hold this position for 5-10 seconds. Cat-cow Repeat these steps until your lower back becomes more flexible: 1. Get into a hands-and-knees position on a firm surface. Keep your hands under your shoulders, and keep your knees under your hips. You may place padding under your knees for comfort. 2. Let your head hang down toward your chest. Contract your abdominal muscles and point your tailbone toward the floor so your lower back becomes rounded like the back of a cat. 3. Hold this position for 5 seconds. 4. Slowly lift your head, let your abdominal muscles relax and point your tailbone up toward the ceiling so your back forms a sagging arch like the back of a cow. 5. Hold this position for 5 seconds.  Press-ups Repeat these steps 5-10 times: 1. Lie on your abdomen (face-down) on the floor. 2. Place your palms near your head, about shoulder-width apart. 3. Keeping your back as relaxed as possible and keeping your hips on the floor, slowly straighten your arms to raise the top half of your body and lift your shoulders. Do not use your back muscles to raise your upper torso. You may adjust the placement of your hands to make yourself more comfortable. 4. Hold this position for 5 seconds while you keep your back relaxed. 5. Slowly return to lying flat on the floor.  Bridges Repeat these steps 10 times: 1. Lie on your back on a firm surface.  2. Bend your knees so they are pointing toward the ceiling and your feet are flat on the floor. Your arms should be flat at your sides, next to your body. 3. Tighten your buttocks muscles and lift your buttocks off the floor until your waist is at almost the same height as your knees. You should feel the muscles working in your buttocks and the back of your thighs. If you do not feel these muscles, slide your feet 1-2 inches farther away from your buttocks. 4. Hold this position for 3-5 seconds. 5. Slowly lower your hips to the starting position, and allow your buttocks muscles to relax completely. If this exercise is too easy, try doing it with  your arms crossed over your chest. Abdominal crunches Repeat these steps 5-10 times: 1. Lie on your back on a firm bed or the floor with your legs extended. 2. Bend your knees so they are pointing toward the ceiling and your feet are flat on the floor. 3. Cross your arms over your chest. 4. Tip your chin slightly toward your chest without bending your neck. 5. Tighten your abdominal muscles and slowly raise your trunk (torso) high enough to lift your shoulder blades a tiny bit off the floor. Avoid raising your torso higher than that because it can put too much stress on your low back and does not help to strengthen your abdominal muscles. 6. Slowly return to your starting position. Back lifts Repeat these steps 5-10 times: 1. Lie on your abdomen (face-down) with your arms at your sides, and rest your forehead on the floor. 2. Tighten the muscles in your legs and your buttocks. 3. Slowly lift your chest off the floor while you keep your hips pressed to the floor. Keep the back of your head in line with the curve in your back. Your eyes should be looking at the floor. 4. Hold this position for 3-5 seconds. 5. Slowly return to your starting position. Contact a health care provider if:  Your back pain or discomfort gets much worse when you do an exercise.   Your worsening back pain or discomfort does not lessen within 2 hours after you exercise. If you have any of these problems, stop doing these exercises right away. Do not do them again unless your health care provider says that you can. Get help right away if:  You develop sudden, severe back pain. If this happens, stop doing the exercises right away. Do not do them again unless your health care provider says that you can. This information is not intended to replace advice given to you by your health care provider. Make sure you discuss any questions you have with your health care provider. Document Revised: 04/23/2019 Document Reviewed: 09/18/2018 Elsevier Patient Education  [place].

## 2020-03-10 NOTE — Telephone Encounter (Signed)
Ok to hold plavix 5 days prior to EGD/colonoscopy.

## 2020-03-11 ENCOUNTER — Telehealth: Payer: Self-pay | Admitting: Internal Medicine

## 2020-03-11 ENCOUNTER — Encounter: Payer: Self-pay | Admitting: Internal Medicine

## 2020-03-11 NOTE — Telephone Encounter (Signed)
Lm to call office and schedule fasting lab around 05/16/20 or after.

## 2020-03-14 ENCOUNTER — Other Ambulatory Visit: Payer: Self-pay | Admitting: Cardiovascular Disease

## 2020-03-14 ENCOUNTER — Other Ambulatory Visit: Payer: Self-pay | Admitting: Physician Assistant

## 2020-03-15 ENCOUNTER — Other Ambulatory Visit: Payer: Self-pay | Admitting: Internal Medicine

## 2020-03-15 DIAGNOSIS — M0609 Rheumatoid arthritis without rheumatoid factor, multiple sites: Secondary | ICD-10-CM | POA: Diagnosis not present

## 2020-03-15 DIAGNOSIS — M069 Rheumatoid arthritis, unspecified: Secondary | ICD-10-CM | POA: Diagnosis not present

## 2020-03-15 DIAGNOSIS — J309 Allergic rhinitis, unspecified: Secondary | ICD-10-CM

## 2020-03-15 MED ORDER — MONTELUKAST SODIUM 10 MG PO TABS: 10.0000 mg | ORAL_TABLET | Freq: Every day | ORAL | 3 refills | Status: DC

## 2020-03-15 NOTE — Addendum Note (Signed)
Addended by: Orland Mustard on: 03/15/2020 08:06 AM   Modules accepted: Orders

## 2020-03-17 ENCOUNTER — Ambulatory Visit: Payer: 59 | Admitting: Cardiovascular Disease

## 2020-03-17 ENCOUNTER — Ambulatory Visit
Admission: RE | Admit: 2020-03-17 | Discharge: 2020-03-17 | Disposition: A | Discharge status: Home / Self Care | Payer: 59 | Source: Ambulatory Visit | Attending: Internal Medicine | Admitting: Internal Medicine

## 2020-03-17 ENCOUNTER — Ambulatory Visit: Payer: 59

## 2020-03-17 ENCOUNTER — Other Ambulatory Visit: Payer: Self-pay

## 2020-03-17 DIAGNOSIS — J9811 Atelectasis: Secondary | ICD-10-CM | POA: Diagnosis not present

## 2020-03-17 DIAGNOSIS — R911 Solitary pulmonary nodule: Secondary | ICD-10-CM | POA: Diagnosis not present

## 2020-03-18 NOTE — Telephone Encounter (Signed)
Patient will call back in next week to schedule labs.

## 2020-03-22 DIAGNOSIS — M546 Pain in thoracic spine: Secondary | ICD-10-CM | POA: Diagnosis not present

## 2020-03-22 DIAGNOSIS — M25611 Stiffness of right shoulder, not elsewhere classified: Secondary | ICD-10-CM | POA: Diagnosis not present

## 2020-03-22 DIAGNOSIS — G894 Chronic pain syndrome: Secondary | ICD-10-CM | POA: Diagnosis not present

## 2020-03-22 DIAGNOSIS — M62830 Muscle spasm of back: Secondary | ICD-10-CM | POA: Diagnosis not present

## 2020-03-22 DIAGNOSIS — M25511 Pain in right shoulder: Secondary | ICD-10-CM | POA: Diagnosis not present

## 2020-03-22 DIAGNOSIS — M792 Neuralgia and neuritis, unspecified: Secondary | ICD-10-CM | POA: Diagnosis not present

## 2020-03-22 DIAGNOSIS — M6080 Other myositis, unspecified site: Secondary | ICD-10-CM | POA: Diagnosis not present

## 2020-03-22 DIAGNOSIS — M545 Low back pain: Secondary | ICD-10-CM | POA: Diagnosis not present

## 2020-03-22 DIAGNOSIS — M5137 Other intervertebral disc degeneration, lumbosacral region: Secondary | ICD-10-CM | POA: Diagnosis not present

## 2020-03-23 ENCOUNTER — Other Ambulatory Visit
Admission: RE | Admit: 2020-03-23 | Discharge: 2020-03-23 | Disposition: A | Discharge status: Home / Self Care | Payer: 59 | Source: Ambulatory Visit | Attending: Gastroenterology | Admitting: Gastroenterology

## 2020-03-23 ENCOUNTER — Other Ambulatory Visit: Payer: Self-pay

## 2020-03-23 DIAGNOSIS — Z7982 Long term (current) use of aspirin: Secondary | ICD-10-CM | POA: Diagnosis not present

## 2020-03-23 DIAGNOSIS — Z13810 Encounter for screening for upper gastrointestinal disorder: Secondary | ICD-10-CM | POA: Diagnosis not present

## 2020-03-23 DIAGNOSIS — M069 Rheumatoid arthritis, unspecified: Secondary | ICD-10-CM | POA: Diagnosis not present

## 2020-03-23 DIAGNOSIS — Z79899 Other long term (current) drug therapy: Secondary | ICD-10-CM | POA: Diagnosis not present

## 2020-03-23 DIAGNOSIS — E10319 Type 1 diabetes mellitus with unspecified diabetic retinopathy without macular edema: Secondary | ICD-10-CM | POA: Diagnosis not present

## 2020-03-23 DIAGNOSIS — Z888 Allergy status to other drugs, medicaments and biological substances status: Secondary | ICD-10-CM | POA: Diagnosis not present

## 2020-03-23 DIAGNOSIS — E785 Hyperlipidemia, unspecified: Secondary | ICD-10-CM | POA: Diagnosis not present

## 2020-03-23 DIAGNOSIS — Z9842 Cataract extraction status, left eye: Secondary | ICD-10-CM | POA: Diagnosis not present

## 2020-03-23 DIAGNOSIS — I2510 Atherosclerotic heart disease of native coronary artery without angina pectoris: Secondary | ICD-10-CM | POA: Diagnosis not present

## 2020-03-23 DIAGNOSIS — Z91013 Allergy to seafood: Secondary | ICD-10-CM | POA: Diagnosis not present

## 2020-03-23 DIAGNOSIS — Z794 Long term (current) use of insulin: Secondary | ICD-10-CM | POA: Diagnosis not present

## 2020-03-23 DIAGNOSIS — Z9841 Cataract extraction status, right eye: Secondary | ICD-10-CM | POA: Diagnosis not present

## 2020-03-23 DIAGNOSIS — I4891 Unspecified atrial fibrillation: Secondary | ICD-10-CM | POA: Diagnosis not present

## 2020-03-23 DIAGNOSIS — Z951 Presence of aortocoronary bypass graft: Secondary | ICD-10-CM | POA: Diagnosis not present

## 2020-03-23 DIAGNOSIS — R933 Abnormal findings on diagnostic imaging of other parts of digestive tract: Secondary | ICD-10-CM | POA: Diagnosis not present

## 2020-03-23 DIAGNOSIS — Z955 Presence of coronary angioplasty implant and graft: Secondary | ICD-10-CM | POA: Diagnosis not present

## 2020-03-23 DIAGNOSIS — Z20822 Contact with and (suspected) exposure to covid-19: Secondary | ICD-10-CM | POA: Diagnosis not present

## 2020-03-23 DIAGNOSIS — Z7902 Long term (current) use of antithrombotics/antiplatelets: Secondary | ICD-10-CM | POA: Diagnosis not present

## 2020-03-23 LAB — SARS CORONAVIRUS 2 (TAT 6-24 HRS): SARS Coronavirus 2: NEGATIVE

## 2020-03-25 ENCOUNTER — Encounter
Admission: RE | Disposition: A | Discharge status: Home / Self Care | Payer: Self-pay | Source: Home / Self Care | Attending: Gastroenterology

## 2020-03-25 ENCOUNTER — Encounter: Payer: Self-pay | Admitting: *Deleted

## 2020-03-25 ENCOUNTER — Ambulatory Visit: Payer: 59 | Admitting: Anesthesiology

## 2020-03-25 ENCOUNTER — Ambulatory Visit
Admission: RE | Admit: 2020-03-25 | Discharge: 2020-03-25 | Disposition: A | Discharge status: Home / Self Care | Payer: 59 | Attending: Gastroenterology | Admitting: Gastroenterology

## 2020-03-25 ENCOUNTER — Other Ambulatory Visit: Payer: Self-pay

## 2020-03-25 DIAGNOSIS — I4891 Unspecified atrial fibrillation: Secondary | ICD-10-CM | POA: Insufficient documentation

## 2020-03-25 DIAGNOSIS — Z955 Presence of coronary angioplasty implant and graft: Secondary | ICD-10-CM | POA: Insufficient documentation

## 2020-03-25 DIAGNOSIS — R933 Abnormal findings on diagnostic imaging of other parts of digestive tract: Secondary | ICD-10-CM | POA: Diagnosis not present

## 2020-03-25 DIAGNOSIS — Z9841 Cataract extraction status, right eye: Secondary | ICD-10-CM | POA: Insufficient documentation

## 2020-03-25 DIAGNOSIS — Z7982 Long term (current) use of aspirin: Secondary | ICD-10-CM | POA: Insufficient documentation

## 2020-03-25 DIAGNOSIS — Z888 Allergy status to other drugs, medicaments and biological substances status: Secondary | ICD-10-CM | POA: Insufficient documentation

## 2020-03-25 DIAGNOSIS — E785 Hyperlipidemia, unspecified: Secondary | ICD-10-CM | POA: Insufficient documentation

## 2020-03-25 DIAGNOSIS — E0590 Thyrotoxicosis, unspecified without thyrotoxic crisis or storm: Secondary | ICD-10-CM | POA: Diagnosis not present

## 2020-03-25 DIAGNOSIS — Z7902 Long term (current) use of antithrombotics/antiplatelets: Secondary | ICD-10-CM | POA: Diagnosis not present

## 2020-03-25 DIAGNOSIS — Z13810 Encounter for screening for upper gastrointestinal disorder: Secondary | ICD-10-CM | POA: Diagnosis not present

## 2020-03-25 DIAGNOSIS — Z9842 Cataract extraction status, left eye: Secondary | ICD-10-CM | POA: Diagnosis not present

## 2020-03-25 DIAGNOSIS — Z79899 Other long term (current) drug therapy: Secondary | ICD-10-CM | POA: Insufficient documentation

## 2020-03-25 DIAGNOSIS — K219 Gastro-esophageal reflux disease without esophagitis: Secondary | ICD-10-CM | POA: Diagnosis not present

## 2020-03-25 DIAGNOSIS — E103511 Type 1 diabetes mellitus with proliferative diabetic retinopathy with macular edema, right eye: Secondary | ICD-10-CM | POA: Diagnosis not present

## 2020-03-25 DIAGNOSIS — Z794 Long term (current) use of insulin: Secondary | ICD-10-CM | POA: Insufficient documentation

## 2020-03-25 DIAGNOSIS — I10 Essential (primary) hypertension: Secondary | ICD-10-CM | POA: Diagnosis not present

## 2020-03-25 DIAGNOSIS — I25119 Atherosclerotic heart disease of native coronary artery with unspecified angina pectoris: Secondary | ICD-10-CM | POA: Diagnosis not present

## 2020-03-25 DIAGNOSIS — Z91013 Allergy to seafood: Secondary | ICD-10-CM | POA: Insufficient documentation

## 2020-03-25 DIAGNOSIS — Z951 Presence of aortocoronary bypass graft: Secondary | ICD-10-CM | POA: Insufficient documentation

## 2020-03-25 DIAGNOSIS — M069 Rheumatoid arthritis, unspecified: Secondary | ICD-10-CM | POA: Insufficient documentation

## 2020-03-25 DIAGNOSIS — Z20822 Contact with and (suspected) exposure to covid-19: Secondary | ICD-10-CM | POA: Insufficient documentation

## 2020-03-25 DIAGNOSIS — E782 Mixed hyperlipidemia: Secondary | ICD-10-CM | POA: Diagnosis not present

## 2020-03-25 DIAGNOSIS — E10319 Type 1 diabetes mellitus with unspecified diabetic retinopathy without macular edema: Secondary | ICD-10-CM | POA: Insufficient documentation

## 2020-03-25 DIAGNOSIS — I2510 Atherosclerotic heart disease of native coronary artery without angina pectoris: Secondary | ICD-10-CM | POA: Insufficient documentation

## 2020-03-25 HISTORY — PX: COLONOSCOPY WITH PROPOFOL: SHX5780

## 2020-03-25 HISTORY — PX: ESOPHAGOGASTRODUODENOSCOPY (EGD) WITH PROPOFOL: SHX5813

## 2020-03-25 LAB — GLUCOSE, CAPILLARY: Glucose-Capillary: 191 mg/dL — ABNORMAL HIGH (ref 70–99)

## 2020-03-25 SURGERY — COLONOSCOPY WITH PROPOFOL
Anesthesia: General

## 2020-03-25 MED ORDER — LIDOCAINE 2% (20 MG/ML) 5 ML SYRINGE
INTRAMUSCULAR | Status: DC | PRN
  Administered 2020-03-25: 100 mg via INTRAVENOUS

## 2020-03-25 MED ORDER — ONDANSETRON HCL 4 MG/2ML IJ SOLN
INTRAMUSCULAR | Status: DC | PRN
  Administered 2020-03-25: 4 mg via INTRAVENOUS

## 2020-03-25 MED ORDER — SODIUM CHLORIDE 0.9 % IV SOLN
INTRAVENOUS | Status: DC
  Administered 2020-03-25: 1000 mL via INTRAVENOUS

## 2020-03-25 MED ORDER — PROPOFOL 500 MG/50ML IV EMUL
INTRAVENOUS | Status: DC | PRN
  Administered 2020-03-25: 200 ug/kg/min via INTRAVENOUS

## 2020-03-25 MED ORDER — PROPOFOL 500 MG/50ML IV EMUL
INTRAVENOUS | Status: AC
  Filled 2020-03-25: qty 50

## 2020-03-25 MED ORDER — ONDANSETRON HCL 4 MG/2ML IJ SOLN
INTRAMUSCULAR | Status: AC
  Filled 2020-03-25: qty 2

## 2020-03-25 MED ORDER — GLYCOPYRROLATE 0.2 MG/ML IJ SOLN
INTRAMUSCULAR | Status: DC | PRN
  Administered 2020-03-25: .2 mg via INTRAVENOUS

## 2020-03-25 MED ORDER — LIDOCAINE HCL (PF) 2 % IJ SOLN
INTRAMUSCULAR | Status: AC
  Filled 2020-03-25: qty 10

## 2020-03-25 MED ORDER — PROPOFOL 10 MG/ML IV BOLUS
INTRAVENOUS | Status: DC | PRN
  Administered 2020-03-25: 20 mg via INTRAVENOUS
  Administered 2020-03-25: 80 mg via INTRAVENOUS

## 2020-03-25 NOTE — Anesthesia Preprocedure Evaluation (Signed)
Anesthesia Evaluation  Patient identified by MRN, date of birth, ID band Patient awake    Reviewed: Allergy & Precautions, H&P , NPO status , Patient's Chart, lab work & pertinent test results, reviewed documented beta blocker date and time   Airway Mallampati: II   Neck ROM: full    Dental  (+) Teeth Intact   Pulmonary neg pulmonary ROS,    Pulmonary exam normal        Cardiovascular Exercise Tolerance: Good hypertension, On Medications + angina with exertion + CAD, + Past MI and + CABG  Atrial Fibrillation  Rhythm:regular Rate:Normal     Neuro/Psych  Headaches,  Neuromuscular disease negative psych ROS   GI/Hepatic Neg liver ROS, hiatal hernia, GERD  Medicated,  Endo/Other  diabetes, Well Controlled, Type 1, Insulin DependentHyperthyroidism   Renal/GU negative Renal ROS  negative genitourinary   Musculoskeletal   Abdominal   Peds  Hematology negative hematology ROS (+)   Anesthesia Other Findings Past Medical History: No date: ACUT MI ANTEROLAT WALL SUBSQT EPIS CARE No date: Acute maxillary sinusitis No date: ALLERGIC RHINITIS, SEASONAL No date: ARTHRITIS, RHEUMATOID     Comment:  shoulders and hands Enbrel>leg swelling Dr. Estanislado Pandy No date: Atrial fibrillation ([place])     Comment:  a. after CABG. No date: Back pain No date: Bruxism (teeth grinding) No date: CAD, ARTERY BYPASS GRAFT     Comment:  a. DES to RCA in 2010 then LAD occlusion s/p CABG 3               06/07/2009 with LIMA to LAD, reverse SVG to D1, reverse SVG              to distal RCA. b. Cath 05/08/2016 slightly hypodense region              in the intermediate branch, however she had excellent               flow, FFR was normal. Vein graft to PDA and the               posterolateral branch is patent, patent LIMA to LAD,               occluded SVG to diagonal. No date: CARPAL TUNNEL SYNDROME, BILATERAL No date: CHOLELITHIASIS No date: Contrast  media allergy No date: DERMATITIS, ALLERGIC No date: DIABETES MELLITUS, TYPE I     Comment:  on insulin pump dx'ed age 54 y.o  No date: DIABETIC  RETINOPATHY No date: Hiatal hernia No date: HYPERLIPIDEMIA-MIXED No date: HYPERTHYROIDISM     Comment:  Dr. Dollene Cleveland No date: Insulin pump in place 02/19/2008: MIGRAINE W/O AURA W/INTRACT W/STATUS MIGRAINOSUS No date: Psoriasis 11/08/2010: SINUS TACHYCARDIA No date: SVT (supraventricular tachycardia) (HCC)     Comment:  after s/p CABG No date: TRIGGER FINGER     Comment:  all fingers b/l hands  No date: URI Past Surgical History: No date: ABDOMINAL HYSTERECTOMY     Comment:  endometriomas b/l; total ? cervix removed; no h/o               abnormal pap No date: Caesarean section 05/08/2016: CARDIAC CATHETERIZATION; N/A     Comment:  Procedure: Left Heart Cath and Cors/Grafts Angiography;               Surgeon: Burnell Blanks, MD;  Location: [place] [facility];  Service: Cardiovascular;  Laterality:               N/A; No date: CARPAL TUNNEL RELEASE     Comment:  b/l  No date: CATARACT EXTRACTION, BILATERAL No date: CHOLECYSTECTOMY No date: CORONARY ARTERY BYPASS GRAFT No date: EYE SURGERY     Comment:  laser x 2 for retinopathy  02/22/2020: LEFT HEART CATH AND CORS/GRAFTS ANGIOGRAPHY; N/A     Comment:  Procedure: LEFT HEART CATH AND CORS/GRAFTS ANGIOGRAPHY;               Surgeon: Burnell Blanks, MD;  Location: [place] [facility];  Service: Cardiovascular;  Laterality:               N/A; No date: TRIGGER FINGER RELEASE     Comment:  b/l fingers all  No date: VITRECTOMY     Comment:  b/l  BMI    Body Mass Index: 30.11 kg/m     Reproductive/Obstetrics negative OB ROS                             Anesthesia Physical Anesthesia Plan  ASA: III  Anesthesia Plan: General   Post-op Pain Management:    Induction:   PONV Risk Score and Plan:   Airway  Management Planned:   Additional Equipment:   Intra-op Plan:   Post-operative Plan:   Informed Consent: I have reviewed the patients History and Physical, chart, labs and discussed the procedure including the risks, benefits and alternatives for the proposed anesthesia with the patient or authorized representative who has indicated his/her understanding and acceptance.     Dental Advisory Given  Plan Discussed with: CRNA  Anesthesia Plan Comments:         Anesthesia Quick Evaluation

## 2020-03-25 NOTE — Op Note (Signed)
[facility] Patient Name: [name] Procedure Date: 03/25/2020 7:16 AM MRN: [id number] Account #: [id number] Date of Birth: 1966-10-07 Admit Type: Outpatient Age: 54 Room: [facility] ENDO ROOM 4 Gender: Female Note Status: Finalized Procedure:             Colonoscopy Indications:           Abnormal CT of the GI tract Providers:             Jonathon Bellows MD, MD Referring MD:          Nino Glow Mclean-Scocuzza MD, MD (Referring MD) Medicines:             Monitored Anesthesia Care Complications:         No immediate complications. Procedure:             Pre-Anesthesia Assessment:                        - Prior to the procedure, a History and Physical was                         performed, and patient medications, allergies and                         sensitivities were reviewed. The patient's tolerance                         of previous anesthesia was reviewed.                        - The risks and benefits of the procedure and the                         sedation options and risks were discussed with the                         patient. All questions were answered and informed                         consent was obtained.                        - ASA Grade Assessment: II - A patient with mild                         systemic disease.                        After obtaining informed consent, the colonoscope was                         passed under direct vision. Throughout the procedure,                         the patient's blood pressure, pulse, and oxygen                         saturations were monitored continuously. The                         Colonoscope was introduced through  the anus and                         advanced to the the cecum, identified by the                         appendiceal orifice. The colonoscopy was performed                         with ease. The patient tolerated the procedure well.                         The quality of the  bowel preparation was excellent. Findings:      The entire examined colon appeared normal on direct and retroflexion       views. Impression:            - The entire examined colon is normal on direct and                         retroflexion views.                        - No specimens collected. Recommendation:        - Discharge patient to home (with escort).                        - Resume previous diet.                        - Continue present medications.                        - Repeat colonoscopy in 5 years for surveillance. Procedure Code(s):     --- Professional ---                        [phone], Colonoscopy, flexible; diagnostic, including                         collection of specimen(s) by brushing or washing, when                         performed (separate procedure) Diagnosis Code(s):     --- Professional ---                        R93.3, Abnormal findings on diagnostic imaging of                         other parts of digestive tract CPT copyright 2019 American Medical Association. All rights reserved. The codes documented in this report are preliminary and upon coder review may  be revised to meet current compliance requirements. Jonathon Bellows, MD Jonathon Bellows MD, MD 03/25/2020 7:56:33 AM This report has been signed electronically. Number of Addenda: 0 Note Initiated On: 03/25/2020 7:16 AM Scope Withdrawal Time: 0 hours 7 minutes 55 seconds  Total Procedure Duration: 0 hours 12 minutes 21 seconds  Estimated Blood Loss:  Estimated blood loss: none.      [facility]

## 2020-03-25 NOTE — Transfer of Care (Signed)
Immediate Anesthesia Transfer of Care Note  Patient: [name]  Procedure(s) Performed: COLONOSCOPY WITH PROPOFOL (N/A ) ESOPHAGOGASTRODUODENOSCOPY (EGD) WITH PROPOFOL (N/A )  Patient Location: Endoscopy Unit  Anesthesia Type:General  Level of Consciousness: awake, alert  and oriented  Airway & Oxygen Therapy: Patient connected to nasal cannula oxygen  Post-op Assessment: Post -op Vital signs reviewed and stable  Post vital signs: stable  Last Vitals:  Vitals Value Taken Time  BP 106/79 03/25/20 0759  Temp    Pulse 84 03/25/20 0759  Resp 15 03/25/20 0759  SpO2 100 % 03/25/20 0759  Vitals shown include unvalidated device data.  Last Pain:  Vitals:   03/25/20 0700  TempSrc: Temporal  PainSc: 0-No pain         Complications: No apparent anesthesia complications

## 2020-03-25 NOTE — Op Note (Signed)
[facility] Patient Name: [name] Procedure Date: 03/25/2020 7:17 AM MRN: [id number] Account #: [id number] Date of Birth: 1966-01-12 Admit Type: Outpatient Age: 54 Room: [facility] ENDO ROOM 4 Gender: Female Note Status: Finalized Procedure:             Upper GI endoscopy Indications:           Screening for Barrett's esophagus Providers:             Jonathon Bellows MD, MD Referring MD:          Nino Glow Mclean-Scocuzza MD, MD (Referring MD) Medicines:             Monitored Anesthesia Care Complications:         No immediate complications. Procedure:             Pre-Anesthesia Assessment:                        - Prior to the procedure, a History and Physical was                         performed, and patient medications, allergies and                         sensitivities were reviewed. The patient's tolerance                         of previous anesthesia was reviewed.                        - The risks and benefits of the procedure and the                         sedation options and risks were discussed with the                         patient. All questions were answered and informed                         consent was obtained.                        - ASA Grade Assessment: II - A patient with mild                         systemic disease.                        After obtaining informed consent, the endoscope was                         passed under direct vision. Throughout the procedure,                         the patient's blood pressure, pulse, and oxygen                         saturations were monitored continuously. The Endoscope                         was introduced through  the mouth, and advanced to the                         third part of duodenum. The upper GI endoscopy was                         accomplished with ease. The patient tolerated the                         procedure well. Findings:      The esophagus was normal.  The stomach was normal.      The examined duodenum was normal. Impression:            - Normal esophagus.                        - Normal stomach.                        - Normal examined duodenum.                        - No specimens collected. Recommendation:        - Perform a colonoscopy today. Procedure Code(s):     --- Professional ---                        [phone], Esophagogastroduodenoscopy, flexible,                         transoral; diagnostic, including collection of                         specimen(s) by brushing or washing, when performed                         (separate procedure) Diagnosis Code(s):     --- Professional ---                        Z13.810, Encounter for screening for upper                         gastrointestinal disorder CPT copyright 2019 American Medical Association. All rights reserved. The codes documented in this report are preliminary and upon coder review may  be revised to meet current compliance requirements. Jonathon Bellows, MD Jonathon Bellows MD, MD 03/25/2020 7:40:49 AM This report has been signed electronically. Number of Addenda: 0 Note Initiated On: 03/25/2020 7:17 AM Estimated Blood Loss:  Estimated blood loss: none.      [facility]

## 2020-03-25 NOTE — H&P (Signed)
Jonathon Bellows, MD [address], [place], [place], [place], [address] [address], [place], [place], [place], [address] Phone: [phone]  Fax: [phone]  Primary Care Physician:  McLean-Scocuzza, Nino Glow, MD   Pre-Procedure History & Physical: HPI:  [name] is a 54 y.o. female is here for an endoscopy and colonoscopy    Past Medical History:  Diagnosis Date  . ACUT MI ANTEROLAT WALL SUBSQT EPIS CARE   . Acute maxillary sinusitis   . ALLERGIC RHINITIS, SEASONAL   . ARTHRITIS, RHEUMATOID    shoulders and hands Enbrel>leg swelling Dr. Estanislado Pandy  . Atrial fibrillation ([place])    a. after CABG.  . Back pain   . Bruxism (teeth grinding)   . CAD, ARTERY BYPASS GRAFT    a. DES to RCA in 2010 then LAD occlusion s/p CABG 3 06/07/2009 with LIMA to LAD, reverse SVG to D1, reverse SVG to distal RCA. b. Cath 05/08/2016 slightly hypodense region in the intermediate branch, however she had excellent flow, FFR was normal. Vein graft to PDA and the posterolateral branch is patent, patent LIMA to LAD, occluded SVG to diagonal.  . CARPAL TUNNEL SYNDROME, BILATERAL   . CHOLELITHIASIS   . Contrast media allergy   . DERMATITIS, ALLERGIC   . DIABETES MELLITUS, TYPE I    on insulin pump dx'ed age 65 y.o   . DIABETIC  RETINOPATHY   . Hiatal hernia   . HYPERLIPIDEMIA-MIXED   . HYPERTHYROIDISM    Dr. Dollene Cleveland  . Insulin pump in place   . MIGRAINE W/O AURA W/INTRACT W/STATUS MIGRAINOSUS 02/19/2008  . Psoriasis   . SINUS TACHYCARDIA 11/08/2010  . SVT (supraventricular tachycardia) (HCC)    after s/p CABG  . TRIGGER FINGER    all fingers b/l hands   . URI     Past Surgical History:  Procedure Laterality Date  . ABDOMINAL HYSTERECTOMY     endometriomas b/l; total ? cervix removed; no h/o abnormal pap  . Caesarean section    . CARDIAC CATHETERIZATION N/A 05/08/2016   Procedure: Left Heart Cath and Cors/Grafts Angiography;  Surgeon: Burnell Blanks, MD;  Location: [place] [facility];  Service: Cardiovascular;  Laterality: N/A;  . CARPAL TUNNEL RELEASE     b/l   . CATARACT EXTRACTION, BILATERAL    . CHOLECYSTECTOMY    . CORONARY ARTERY BYPASS GRAFT    . EYE SURGERY     laser x 2 for retinopathy   . LEFT HEART CATH AND CORS/GRAFTS ANGIOGRAPHY N/A 02/22/2020   Procedure: LEFT HEART CATH AND CORS/GRAFTS ANGIOGRAPHY;  Surgeon: Burnell Blanks, MD;  Location: [place] [facility];  Service: Cardiovascular;  Laterality: N/A;  . TRIGGER FINGER RELEASE     b/l fingers all   . VITRECTOMY     b/l     Prior to Admission medications   Medication Sig Start Date End Date Taking? Authorizing Provider  amLODipine (NORVASC) 5 MG tablet Take 0.5 tablets (2.5 mg total) by mouth daily. 03/10/20  Yes McLean-Scocuzza, Nino Glow, MD  aspirin EC 81 MG tablet Take 81 mg by mouth daily.   Yes [provider]  CHELATED MAGNESIUM PO Take 250 mg by mouth daily.   Yes [provider]  Cholecalciferol (VITAMIN D) 125 MCG (5000 UT) CAPS Take 5,000 Units by mouth daily.   Yes [provider]  clopidogrel (PLAVIX) 75 MG tablet TAKE 1 TABLET BY MOUTH ONCE DAILY 03/15/20  Yes Sherren Mocha, MD  cyclobenzaprine (FLEXERIL) 10 MG tablet Take 1 tablet (10 mg total) by mouth at bedtime. 02/15/20  Yes Deveshwar, Abel Presto, MD  Diclofenac Potassium (CAMBIA) 50 MG PACK Take 50 mg by mouth as needed. 10/08/19  Yes Suzzanne Cloud, NP  EPINEPHrine 0.3 mg/0.3 mL IJ SOAJ injection Inject 0.3 mLs (0.3 mg total) into the muscle as needed for anaphylaxis. 03/09/19  Yes Breeback, Jade L, PA-C  folic acid (FOLVITE) 1 MG tablet .AKE 2 TABLETS (2 MG TOTAL) BY MOUTH DAILY. 11/30/19  Yes Ofilia Neas, PA-C  furosemide (LASIX) 40 MG tablet Take 40 mg by mouth daily as needed for edema.    Yes [provider]  Golimumab ([place] ARIA IV) Inject into the vein.   Yes [provider]  hyoscyamine (LEVSIN) 0.125 MG tablet Take 1 tablet (0.125 mg total) by mouth 3 (three)  times daily as needed. 03/03/20  Yes Jonathon Bellows, MD  insulin lispro (HUMALOG) 100 UNIT/ML injection Inject 80-90 Units into the skin continuous. FOR USE IN INSULIN PUMP. TOTAL DAILY INSULIN DOSE = UP TO 90 UNITS. 03/24/15  Yes [provider]  isosorbide mononitrate (IMDUR) 60 MG 24 hr tablet TAKE 1 TABLET (60 MG TOTAL) BY MOUTH DAILY. 03/15/20  Yes Sherren Mocha, MD  Krill Oil 500 MG CAPS Take 500 mg by mouth daily.   Yes [provider]  leflunomide (ARAVA) 20 MG tablet Take 1 tablet (20 mg total) by mouth daily. 01/06/20  Yes Ofilia Neas, PA-C  losartan (COZAAR) 100 MG tablet Take 1 tablet (100 mg total) by mouth daily. 02/02/20  Yes Sherren Mocha, MD  methimazole (TAPAZOLE) 10 MG tablet Take 1.5 tablets (15 mg total) by mouth daily. Take 10 mg by mouth in the morning and 5 mg in the afternoon 03/10/20  Yes McLean-Scocuzza, Nino Glow, MD  metoprolol tartrate (LOPRESSOR) 50 MG tablet TAKE 1 TABLET BY MOUTH TWICE DAILY 03/15/20  Yes Sherren Mocha, MD  montelukast (SINGULAIR) 10 MG tablet Take 1 tablet (10 mg total) by mouth at bedtime. 03/15/20  Yes McLean-Scocuzza, Nino Glow, MD  nabumetone (RELAFEN) 750 MG tablet TAKE 1 TABLET BY MOUTH TWICE DAILY 02/25/20  Yes Deveshwar, Abel Presto, MD  nitroGLYCERIN (NITROSTAT) 0.4 MG SL tablet Place 1 tablet (0.4 mg total) under the tongue every 5 (five) minutes as needed. 02/15/20 05/15/20 Yes Verta Ellen., NP  ondansetron (ZOFRAN ODT) 8 MG disintegrating tablet Take 1 tablet (8 mg total) by mouth 2 (two) times daily. 02/07/20  Yes Lorin Picket, PA-C  ONE TOUCH ULTRA TEST test strip  11/20/16  Yes [provider]  pantoprazole (PROTONIX) 40 MG tablet Take 1 tablet (40 mg total) by mouth daily. 02/15/20  Yes Verta Ellen., NP  potassium chloride (K-DUR) 10 MEQ tablet Take 1 tablet (10 mEq total) by mouth daily. 07/11/17  Yes Sherren Mocha, MD  promethazine (PHENERGAN) 25 MG tablet Take 1 tablet (25 mg total) by mouth every 6 (six)  hours as needed for nausea or vomiting. 02/07/20  Yes Lorin Picket, PA-C  rosuvastatin (CRESTOR) 20 MG tablet Take 1 tablet (20 mg total) by mouth daily. 03/15/20  Yes Sherren Mocha, MD    Allergies as of 03/04/2020 - Review Complete 03/03/2020  Allergen Reaction Noted  . Prochlorperazine Rash 03/30/2015  . Ramipril Swelling, Rash, and Other (See Comments) 06/15/2013  . Shellfish-derived products Swelling 03/30/2015  . Atorvastatin Rash 03/30/2015  . Compazine  [prochlorperazine edisylate] Other (See Comments) 04/29/2013  . Etanercept Swelling and  Rash 04/29/2013  . Infliximab Rash 03/30/2015  . Iohexol  08/02/2017  . Orencia [abatacept] Rash 06/15/2013  . Shellfish allergy Swelling 02/05/2012  . Tofacitinib Rash and Other (See Comments) 03/30/2015  . Actemra [tocilizumab]  01/06/2020  . Emgality [galcanezumab-gnlm] Hives and Swelling 07/20/2019  . Prochlorperazine edisylate  10/16/2006  . Trokendi xr [topiramate er]  05/01/2019  . Amiodarone Nausea Only 06/15/2013  . Rituximab Rash 03/30/2015    Family History  Problem Relation Age of Onset  . Depression Mother   . Anxiety disorder Mother   . Colon polyps Father   . Diabetes Father        2  . Hypertension Father   . Parkinson's disease Father   . Diabetes Other   . Heart disease Other   . Hypertension Other   . Hyperlipidemia Other   . Depression Other   . Migraines Other   . Stroke Paternal Grandmother   . Heart attack Neg Hx   . Colon cancer Neg Hx   . Stomach cancer Neg Hx   . Esophageal cancer Neg Hx     Social History   Socioeconomic History  . Marital status: Divorced    Spouse name: Not on file  . Number of children: Not on file  . Years of education: Not on file  . Highest education level: Not on file  Occupational History  . Not on file  Tobacco Use  . Smoking status: Never Smoker  . Smokeless tobacco: Never Used  Substance and Sexual Activity  . Alcohol use: Yes    Comment: rarely 1 every  6 months  . Drug use: No  . Sexual activity: Not Currently    Partners: Male    Birth control/protection: None  Other Topics Concern  . Not on file  Social History Narrative   Divorced. Has 2 kids(fraternal twins) daughters age 32. Works at [employer], Never smoked, denies ETOH, no drugs. Drinks diet coke. No exercise.       DPR daughters [name] and Elisabeth Ben twins          Social Determinants of Health   Financial Resource Strain:   . Difficulty of Paying Living Expenses:   Food Insecurity:   . Worried About Charity fundraiser in the Last Year:   . Arboriculturist in the Last Year:   Transportation Needs:   . Film/video editor (Medical):   Marland Kitchen Lack of Transportation (Non-Medical):   Physical Activity:   . Days of Exercise per Week:   . Minutes of Exercise per Session:   Stress:   . Feeling of Stress :   Social Connections:   . Frequency of Communication with Friends and Family:   . Frequency of Social Gatherings with Friends and Family:   . Attends Religious Services:   . Active Member of Clubs or Organizations:   . Attends Archivist Meetings:   Marland Kitchen Marital Status:   Intimate Partner Violence:   . Fear of Current or Ex-Partner:   . Emotionally Abused:   Marland Kitchen Physically Abused:   . Sexually Abused:     Review of Systems: See HPI, otherwise negative ROS  Physical Exam: BP (!) 157/85   Pulse 97   Temp (!) 95.8 F (35.4 C) (Temporal)   Resp 20   Ht 5\' 3"  (1.6 m)   Wt 77.1 kg   SpO2 100%   BMI 30.11 kg/m  General:   Alert,  pleasant and cooperative in  NAD Head:  Normocephalic and atraumatic. Neck:  Supple; no masses or thyromegaly. Lungs:  Clear throughout to auscultation, normal respiratory effort.    Heart:  +S1, +S2, Regular rate and rhythm, No edema. Abdomen:  Soft, nontender and nondistended. Normal bowel sounds, without guarding, and without rebound.   Neurologic:  Alert and  oriented x4;  grossly normal  neurologically.  Impression/Plan: [name] is here for an endoscopy and colonoscopy  to be performed for  evaluation of barrettes esophagus and abnormal CT scan of the abdomen     Risks, benefits, limitations, and alternatives regarding endoscopy have been reviewed with the patient.  Questions have been answered.  All parties agreeable.   Jonathon Bellows, MD  03/25/2020, 7:27 AM

## 2020-03-28 NOTE — Anesthesia Postprocedure Evaluation (Signed)
Anesthesia Post Note  Patient: [name]  Procedure(s) Performed: COLONOSCOPY WITH PROPOFOL (N/A ) ESOPHAGOGASTRODUODENOSCOPY (EGD) WITH PROPOFOL (N/A )  Anesthesia Type: General     Last Vitals:  Vitals:   03/25/20 0816 03/25/20 0826  BP: 137/72 140/73  Pulse:    Resp:    Temp:    SpO2:      Last Pain:  Vitals:   03/26/20 1314  TempSrc:   PainSc: 0-No pain                 Molli Barrows

## 2020-04-04 NOTE — Progress Notes (Signed)
Office Visit Note  Patient: [name]             Date of Birth: 10/15/1966           MRN: [id number]             PCP: McLean-Scocuzza, Nino Glow, MD Referring: Orland Mustard * Visit Date: 04/08/2020 Occupation: @GUAROCC @  Subjective:  Pain in multiple joints   History of Present Illness: [name] is a 54 y.o. female with history of seronegative rheumatoid arthritis.  Patient is on Simponi IV infusions every 8 weeks.  And Arava 20 mg by mouth daily.  She had to discontinue Plaquenil due to Plaquenil eye toxicity.  She also had to discontinue Relafen due to a bout of colitis.  She has been experiencing increased pain and joint inflammation since having to discontinue both of these medications.  She is currently having pain and inflammation in both hands, both wrist joints, both knee joints.  Yesterday she was experiencing increased discomfort in the right shoulder and right elbow joint.  She continues to have morning stiffness lasting at 1-1/2 hours daily.  She denies any psoriasis at this time.  She is due for her next Simponi infusion on 05/10/2020.   Activities of Daily Living:  Patient reports morning stiffness for 1.5 hours.   Patient Reports nocturnal pain.  Difficulty dressing/grooming: Denies Difficulty climbing stairs: Reports Difficulty getting out of chair: Reports Difficulty using hands for taps, buttons, cutlery, and/or writing: Reports  Review of Systems  Constitutional: Positive for fatigue.  HENT: Negative for mouth sores, mouth dryness and nose dryness.   Eyes: Positive for itching. Negative for pain, visual disturbance and dryness.  Respiratory: Negative for cough, hemoptysis, shortness of breath and difficulty breathing.   Cardiovascular: Positive for swelling in legs/feet. Negative for chest pain, palpitations and hypertension.  Gastrointestinal: Positive for constipation and diarrhea. Negative for blood in stool.  Endocrine: Negative for increased  urination.  Genitourinary: Negative for difficulty urinating and painful urination.  Musculoskeletal: Positive for arthralgias, joint pain, joint swelling, muscle weakness and morning stiffness. Negative for myalgias, muscle tenderness and myalgias.  Skin: Negative for color change, pallor, rash, hair loss, nodules/bumps, skin tightness, ulcers and sensitivity to sunlight.  Allergic/Immunologic: Negative for susceptible to infections.  Neurological: Negative for dizziness, numbness and headaches.  Hematological: Negative for bruising/bleeding tendency and swollen glands.  Psychiatric/Behavioral: Negative for depressed mood and sleep disturbance. The patient is not nervous/anxious.     PMFS History:  Patient Active Problem List   Diagnosis Date Noted  . Lung nodule 03/10/2020  . Colitis 03/10/2020  . Type 1 diabetes mellitus with proliferative retinopathy ([place]) 12/10/2019  . Abnormal thyroid function test 12/10/2019  . Bruxism (teeth grinding)   . Chronic migraine 10/07/2019  . Contusion of left hip 07/25/2019  . H/O multiple allergies 03/10/2019  . Hx of anaphylaxis 03/10/2019  . Cough 06/26/2018  . PND (post-nasal drip) 06/26/2018  . Vitamin D deficiency 07/04/2017  . B12 deficiency 07/04/2017  . Abnormal CT scan 06/19/2017  . Gastroesophageal reflux disease 06/19/2017  . Antiplatelet or antithrombotic long-term use 06/19/2017  . Nasal congestion 03/15/2017  . Graves disease 02/01/2017  . Sleep difficulties 02/01/2017  . No energy 02/01/2017  . Osteopenia 02/01/2017  . Hypertension, essential 05/08/2016  . IDDM (insulin dependent diabetes mellitus) 05/08/2016  . Coronary artery disease involving native coronary artery of native heart with angina pectoris ([place])   . Hyperthyroidism 11/09/2010  . SINUS TACHYCARDIA 11/08/2010  .  DERMATITIS, ALLERGIC 07/20/2010  . EDEMA 07/12/2010  . DIZZINESS 11/14/2009  . ATRIAL FIBRILLATION 07/20/2009  . CAD, ARTERY BYPASS GRAFT 06/07/2009    . ANGINA, STABLE/EXERTIONAL 06/02/2009  . HYPERLIPIDEMIA-MIXED 04/26/2009  . ACUT MI ANTEROLAT WALL SUBSQT EPIS CARE 04/26/2009  . Chest pain 04/26/2009  . DIABETIC  RETINOPATHY 04/25/2009  . CARPAL TUNNEL SYNDROME, BILATERAL 04/25/2009  . TRIGGER FINGER 04/25/2009  . MIGRAINE W/O AURA W/INTRACT W/STATUS MIGRAINOSUS 02/19/2008  . ALLERGIC RHINITIS, SEASONAL 10/16/2006  . CHOLELITHIASIS 10/16/2006  . Rheumatoid arthritis ([place]) 10/16/2006    Past Medical History:  Diagnosis Date  . ACUT MI ANTEROLAT WALL SUBSQT EPIS CARE   . Acute maxillary sinusitis   . ALLERGIC RHINITIS, SEASONAL   . ARTHRITIS, RHEUMATOID    shoulders and hands Enbrel>leg swelling Dr. Estanislado Pandy  . Atrial fibrillation ([place])    a. after CABG.  . Back pain   . Bruxism (teeth grinding)   . CAD, ARTERY BYPASS GRAFT    a. DES to RCA in 2010 then LAD occlusion s/p CABG 3 06/07/2009 with LIMA to LAD, reverse SVG to D1, reverse SVG to distal RCA. b. Cath 05/08/2016 slightly hypodense region in the intermediate branch, however she had excellent flow, FFR was normal. Vein graft to PDA and the posterolateral branch is patent, patent LIMA to LAD, occluded SVG to diagonal.  . CARPAL TUNNEL SYNDROME, BILATERAL   . CHOLELITHIASIS   . Contrast media allergy   . DERMATITIS, ALLERGIC   . DIABETES MELLITUS, TYPE I    on insulin pump dx'ed age 70 y.o   . DIABETIC  RETINOPATHY   . Hiatal hernia   . HYPERLIPIDEMIA-MIXED   . HYPERTHYROIDISM    Dr. Dollene Cleveland  . Insulin pump in place   . MIGRAINE W/O AURA W/INTRACT W/STATUS MIGRAINOSUS 02/19/2008  . Psoriasis   . SINUS TACHYCARDIA 11/08/2010  . SVT (supraventricular tachycardia) ([facility])    after s/p CABG  . TRIGGER FINGER    all fingers b/l hands   . URI     Family History  Problem Relation Age of Onset  . Depression Mother   . Anxiety disorder Mother   . Colon polyps Father   . Diabetes Father        2  . Hypertension Father   . Parkinson's disease Father   . Diabetes  Other   . Heart disease Other   . Hypertension Other   . Hyperlipidemia Other   . Depression Other   . Migraines Other   . Stroke Paternal Grandmother   . Heart attack Neg Hx   . Colon cancer Neg Hx   . Stomach cancer Neg Hx   . Esophageal cancer Neg Hx    Past Surgical History:  Procedure Laterality Date  . ABDOMINAL HYSTERECTOMY     endometriomas b/l; total ? cervix removed; no h/o abnormal pap  . Caesarean section    . CARDIAC CATHETERIZATION N/A 05/08/2016   Procedure: Left Heart Cath and Cors/Grafts Angiography;  Surgeon: Burnell Blanks, MD;  Location: [place];  Service: Cardiovascular;  Laterality: N/A;  . CARPAL TUNNEL RELEASE     b/l   . CATARACT EXTRACTION, BILATERAL    . CHOLECYSTECTOMY    . COLONOSCOPY WITH PROPOFOL N/A 03/25/2020   Procedure: COLONOSCOPY WITH PROPOFOL;  Surgeon: Jonathon Bellows, MD;  [facility] END[facility];  Service: Gastroenterology;  Laterality: N/A;  . CORONARY ARTERY BYPASS GRAFT    . ESOPHAGOGASTRODUODENOSCOPY (EGD) WITH PROPOFOL N/A 03/25/2020   Procedure: ESOPHAGOGASTRODUODENOSCOPY (  EGD) WITH PROPOFOL;  Surgeon: Jonathon Bellows, MD;  Location: [facility] ENDOSCOPY;  Service: Gastroenterology;  Laterality: N/A;  . EYE SURGERY     laser x 2 for retinopathy   . LEFT HEART CATH AND CORS/GRAFTS ANGIOGRAPHY N/A 02/22/2020   Procedure: LEFT HEART CATH AND CORS/GRAFTS ANGIOGRAPHY;  Surgeon: Burnell Blanks, MD;  Location: [place] [facility];  Service: Cardiovascular;  Laterality: N/A;  . TRIGGER FINGER RELEASE     b/l fingers all   . VITRECTOMY     b/l    Social History   Social History Narrative   Divorced. Has 2 kids(fraternal twins) daughters age 56. Works at [employer], Never smoked, denies ETOH, no drugs. Drinks diet coke. No exercise.       DPR daughters [name] and Indya Hanninen twins          Immunization History  Administered Date(s) Administered  . Influenza, Quadrivalent, Recombinant, Inj, Pf 10/14/2013  .  Influenza, Seasonal, Injecte, Preservative Fre 09/19/2016  . Influenza,inj,Quad PF,6+ Mos 09/30/2014, 09/03/2018  . Influenza,inj,quad, With Preservative 09/23/2017  . Influenza-Unspecified 09/30/2014, 09/30/2016, 09/03/2018, 09/10/2019  . PFIZER SARS-COV-2 Vaccination 12/28/2019, 01/18/2020  . Pneumococcal Conjugate-13 07/16/2016  . Pneumococcal Polysaccharide-23 01/01/2004, 10/15/2006, 01/04/2020  . Td 10/01/2003  . Tdap 01/09/2011, 06/25/2018     Objective: Vital Signs: BP (!) 111/57 (BP Location: Left Arm, Patient Position: Sitting, Cuff Size: Normal)   Pulse 60   Resp 14   Ht 5\' 3"  (1.6 m)   Wt 170 lb 3.2 oz (77.2 kg)   BMI 30.15 kg/m    Physical Exam Vitals and nursing note reviewed.  Constitutional:      Appearance: She is well-developed.  HENT:     Head: Normocephalic and atraumatic.  Eyes:     Conjunctiva/sclera: Conjunctivae normal.  Pulmonary:     Effort: Pulmonary effort is normal.  Abdominal:     General: Bowel sounds are normal.     Palpations: Abdomen is soft.  Musculoskeletal:     Cervical back: Normal range of motion.  Lymphadenopathy:     Cervical: No cervical adenopathy.  Skin:    General: Skin is warm and dry.     Capillary Refill: Capillary refill takes less than 2 seconds.  Neurological:     Mental Status: She is alert and oriented to person, place, and time.  Psychiatric:        Behavior: Behavior normal.      Musculoskeletal Exam: C-spine, thoracic spine, lumbar spine good range of motion.  No midline spinal tenderness.  Shoulder joints have good range of motion with discomfort in the right shoulder.  Mild bilateral elbow joint contractures.  Limited range of motion with tenderness and inflammation on the volar aspect of both wrists.  She has tenderness and synovitis of the right first and second MCPs and left second and third MCP joints.  Hip joints have limited range of motion with discomfort bilaterally.  Knee joints have good range of motion  with no warmth or effusion.  She has discomfort with range of motion of both knees.  Ankle joints have good range of motion with no tenderness or synovitis.  CDAI Exam: CDAI Score: 16.4  Patient Global: 7 mm; Provider Global: 7 mm Swollen: 6 ; Tender: 9  Joint Exam 04/08/2020      Right  Left  Wrist  Swollen Tender  Swollen Tender  MCP 1  Swollen Tender     MCP 2  Swollen Tender  Swollen Tender  MCP  3     Swollen Tender  MCP 4      Tender  Knee   Tender   Tender     Investigation: No additional findings.  Imaging: CT Chest Wo Contrast  Result Date: 03/18/2020 CLINICAL DATA:  Pulmonary nodule EXAM: CT CHEST WITHOUT CONTRAST TECHNIQUE: Multidetector CT imaging of the chest was performed following the standard protocol without IV contrast. COMPARISON:  CT abdomen and pelvis including lung bases February 29, 2020 FINDINGS: Cardiovascular: There is no demonstrable thoracic aortic aneurysm. Visualized great vessels appear unremarkable on this noncontrast enhanced study. There is aortic atherosclerosis. There are foci of native coronary artery calcification at multiple sites. Patient is status post coronary artery bypass grafting. There is no pericardial effusion or pericardial thickening. Mediastinum/Nodes: Thyroid appears unremarkable. There is no appreciable thoracic adenopathy. No esophageal lesions are appreciable. Lungs/Pleura: There is atelectatic change in the inferior lingula. There is also mild bibasilar atelectasis. There is no edema or airspace opacity. There is a 3 mm nodular opacity in the lateral segment of the left lower lobe seen on axial slice 93 series 3. There is a 2 mm nodular opacity in the anterior segment of the left lower lobe seen on axial slice 93 series 3. No larger parenchymal nodular opacities evident. No pleural effusions. Upper Abdomen: Gallbladder is absent. There are foci of arterial vascular calcification in visualized major mesenteric arterial vessels. Visualized  upper abdominal structures appear otherwise unremarkable. Musculoskeletal: No blastic or lytic bone lesions. Patient is status post median sternotomy. There is sternal separation with healing throughout much of the sternum in the postoperative regions. IMPRESSION: 1. Nodular opacities in the left lower lobe measuring 2-3 mm. No larger pulmonary nodular lesions evident. No follow-up needed if patient is low-risk (and has no known or suspected primary neoplasm). Non-contrast chest CT can be considered in 12 months if patient is high-risk. This recommendation follows the consensus statement: Guidelines for Management of Incidental Pulmonary Nodules Detected on CT Images: From the Fleischner Society 2017; Radiology 2017; 284:228-243. 2. Areas of mild atelectatic change in the lung bases and inferior lingula. No edema or airspace opacity. 3.  No adenopathy. 4. Aortic atherosclerosis. Multiple foci of native coronary artery calcification. Status post coronary artery bypass grafting. 5.  Gallbladder absent. Aortic Atherosclerosis (ICD10-I70.0). Electronically Signed   By: Lowella Grip III M.D.   On: 03/18/2020 08:07    Recent Labs: Lab Results  Component Value Date   WBC 4.9 02/15/2020   HGB 13.4 02/15/2020   PLT 172 02/15/2020   NA 140 02/15/2020   K 4.4 02/15/2020   CL 103 02/15/2020   CO2 21 02/15/2020   GLUCOSE 224 (H) 02/15/2020   BUN 9 02/15/2020   CREATININE 0.80 02/15/2020   BILITOT 0.6 11/17/2019   ALKPHOS 83 10/29/2019   AST 24 11/17/2019   ALT 21 11/17/2019   PROT 5.8 (L) 11/17/2019   ALBUMIN 3.6 10/29/2019   CALCIUM 9.3 02/15/2020   GFRAA 98 02/15/2020   QFTBGOLDPLUS NEGATIVE 10/07/2019    Speciality Comments: PLQ Eye Exam: 01/06/2020 ABNORMAL @ [facility]. Patient advised to discontinue PLQ at office visit on 01/06/2020.  Prior Therapy: methotrexate/Xeljanz/Actemra (GI upset), Enbrel (injection site reaction) and Orencia/Remicade/Humira/Cimzia/Rituxan  (inadequate response).  Procedures:  No procedures performed Allergies: Actemra [tocilizumab], Prochlorperazine, Ramipril, Shellfish-derived products, Atorvastatin, Compazine  [prochlorperazine edisylate], Emgality [galcanezumab-gnlm], Etanercept, Infliximab, Iohexol, Orencia [abatacept], Prochlorperazine edisylate, Shellfish allergy, Tofacitinib, Trokendi xr [topiramate er], Amiodarone, and Rituximab   Assessment / Plan:  Visit Diagnoses: Rheumatoid arthritis of multiple sites with negative rheumatoid factor (HCC) - RF negative, anti-CCP negative, positive ANA. side effects or an inadequate response to PLQ, methotrexate, Enbrel, Humira, Orencia, Xeljanz, Remicade, Cimzia, Rituxan, and actemra: She presents today with tenderness and inflammation in multiple joints as described above.  She has been having increased discomfort in both hands, both wrist joints, both knee joints.  She is currently on Simponi Aria IV infusions every 8 weeks and Arava 20 mg 1 tablet by mouth daily.  She had to discontinue Plaquenil due to Plaquenil eye toxicity.  She also discontinued Relafen recently due to a bout of colitis.  We discussed adding on sulfasalazine 500 mg 2 tablets by mouth twice daily.  Indications, contraindications, potential side effects of sulfasalazine were discussed today.  All questions were addressed and consent was obtained.  G6PD will be checked today.  Prescription pending lab results.  She is advised to notify us if she cannot tolerate taking sulfasalazine.  She will follow-up in the office in 6 weeks.  Medication counseling:  Baseline Immunosuppressant Labs TB GOLD Quantiferon TB Gold Latest Ref Rng & Units 10/07/2019  Quantiferon TB Gold Plus NEGATIVE NEGATIVE   Hepatitis Panel Hepatitis Latest Ref Rng & Units 10/07/2019  Hep B Surface Ag NON-REACTI NON-REACTIVE  Hep B IgM NON-REACTI NON-REACTIVE  Hep C Ab NON-REACTI NON-REACTIVE  Hep C Ab NON-REACTI NON-REACTIVE  Hep A IgM NON-REACTI  NON-REACTIVE   HIV Lab Results  Component Value Date   HIV NON-REACTIVE 10/07/2019   Immunoglobulins Immunoglobulin Electrophoresis Latest Ref Rng & Units 10/07/2019  IgA  47 - 310 mg/dL 161  IgG 600 - 1,640 mg/dL 520(L)  IgM 50 - 300 mg/dL 70   SPEP Serum Protein Electrophoresis Latest Ref Rng & Units 11/17/2019  Total Protein 6.1 - 8.1 g/dL 5.8(L)  Albumin 3.8 - 4.8 g/dL -  Alpha-1 0.2 - 0.3 g/dL -  Alpha-2 0.5 - 0.9 g/dL -  Beta Globulin 0.4 - 0.6 g/dL -  Beta 2 0.2 - 0.5 g/dL -  Gamma Globulin 0.8 - 1.7 g/dL -   G6PD No results found for: G6PDH TPMT No results found for: TPMT   Chest x-ray: 07/10/18   Does the patient have an allergy to sulfa drugs? No  Patient was counseled on the purpose, proper use, and adverse effects of sulfasalazine including risk of infection and chance of nausea, headache, and sun sensitivity.  Also discussed risk of skin rash and advised patient to stop the medication and let us know if she develops a rash. Also discussed for the potential of discoloration of the urine, sweat, or tears.  Advised patient to avoid live vaccines.  Recommend annual influenza, Pneumovax 23, Prevnar 13, and Shingrix as indicated.   Reviewed the importance of frequent labs to monitor liver, kidneys, and blood counts.  Standing orders placed and patient to return 1 month after starting therapy and then every 3 months.  Provided patient with educational materials on sulfasalazine and answered all questions.  Patient consented to sulfasalazine use, and consent will be uploaded into the media tab.    Patient dose will be 500 mg 2 tablets twice daily.  Prescription will be sent to pharmacy pending lab results and insurance approval.   High risk medication use - Simponi Aria IV infusions every 8 weeks and Arava 20 mg daily.  She will be starting on sulfasalazine 500 mg 2 tablets by mouth twice daily.  G6PD was checked today.  She will require lab  work in 1 month and every 3  months.- Plan: Glucose 6 phosphate dehydrogenase  Psoriasis: She has no psoriasis at this time.  Her psoriasis resolved after restarting on Simponi IV infusions.  Contracture of joint of both elbows - She has mild old contracture in her bilateral elbow joints.  No tenderness or synovitis was noted.  She has tenderness over the lateral epicondyle of the right elbow.  Other fatigue: Chronic and related to insomnia.  Other insomnia: She continues to experience interrupted sleep at night due to nocturnal pain.  She takes Flexeril 10 mg by mouth at bedtime for muscle spasms and insomnia.  Osteopenia of multiple sites - DEXA on 11/11/19: Left femur neck BMD 0.901 with T-score -1.0.  She is taking vitamin D supplement.  History of vitamin D deficiency: She is taking a vitamin D supplement.  DDD (degenerative disc disease), thoracic: No midline spinal tenderness.  Reviewed x-rays from 02/26/2020.  DDD (degenerative disc disease), lumbar: No midline spinal tenderness.  She experiences intermittent discomfort.  Reviewed x-rays from 02/26/2020.  Other medical conditions are listed as follows:  Abnormal SPEP  History of hyperlipidemia  History of hypertension  History of diabetes mellitus  History of gastroesophageal reflux (GERD)  History of Graves' disease  History of migraine  History of coronary artery disease  Antiplatelet or antithrombotic long-term use - she is on Plavix.    Orders: Orders Placed This Encounter  Procedures  . Glucose 6 phosphate dehydrogenase   No orders of the defined types were placed in this encounter.   Face-to-face time spent with patient was 30 minutes. Greater than 50% of time was spent in counseling and coordination of care.  Follow-Up Instructions: Return in about 6 weeks (around 05/20/2020) for Rheumatoid arthritis.   Ofilia Neas, PA-C   I examined and evaluated the patient with Hazel Sams PA.  Patient continues to have ongoing synovitis.   She has tried multiple medications in the past which all failed.  Today we are detailed discussion regarding different treatment options and their side effects.  After discussing indications side effects contraindications of Azulfidine EN she was in agreement to at the medication.  We will obtain G6PD.  If that is normal then she will start on Azulfidine EN 500 mg 2 tablets p.o. twice daily.  She has been advised to get labs in a month and then every 3 months to monitor for drug toxicity.  The plan of care was discussed as noted above.  Bo Merino, MD  Note - This record has been created using Editor, commissioning.  Chart creation errors have been sought, but may not always  have been located. Such creation errors do not reflect on  the standard of medical care.

## 2020-04-07 ENCOUNTER — Telehealth: Payer: Self-pay | Admitting: Pharmacist

## 2020-04-07 ENCOUNTER — Other Ambulatory Visit: Payer: Self-pay | Admitting: Gastroenterology

## 2020-04-07 ENCOUNTER — Other Ambulatory Visit: Payer: Self-pay

## 2020-04-07 MED ORDER — LINACLOTIDE 290 MCG PO CAPS: 290.0000 ug | ORAL_CAPSULE | Freq: Every day | ORAL | 3 refills | Status: DC

## 2020-04-07 NOTE — Telephone Encounter (Signed)
Called patient to schedule an appointment for the [facility] [facility]. I was unable to reach the patient so I left a HIPAA-compliant message requesting that the patient return my call.   

## 2020-04-08 ENCOUNTER — Ambulatory Visit: Payer: 59 | Admitting: Rheumatology

## 2020-04-08 ENCOUNTER — Encounter: Payer: Self-pay | Admitting: Rheumatology

## 2020-04-08 ENCOUNTER — Other Ambulatory Visit: Payer: Self-pay

## 2020-04-08 VITALS — BP 111/57 | HR 60 | Resp 14 | Ht 63.0 in | Wt 170.2 lb

## 2020-04-08 DIAGNOSIS — Z8679 Personal history of other diseases of the circulatory system: Secondary | ICD-10-CM

## 2020-04-08 DIAGNOSIS — Z8639 Personal history of other endocrine, nutritional and metabolic disease: Secondary | ICD-10-CM | POA: Diagnosis not present

## 2020-04-08 DIAGNOSIS — Z79899 Other long term (current) drug therapy: Secondary | ICD-10-CM | POA: Diagnosis not present

## 2020-04-08 DIAGNOSIS — M5136 Other intervertebral disc degeneration, lumbar region: Secondary | ICD-10-CM

## 2020-04-08 DIAGNOSIS — M24521 Contracture, right elbow: Secondary | ICD-10-CM

## 2020-04-08 DIAGNOSIS — M0609 Rheumatoid arthritis without rheumatoid factor, multiple sites: Secondary | ICD-10-CM

## 2020-04-08 DIAGNOSIS — R5383 Other fatigue: Secondary | ICD-10-CM

## 2020-04-08 DIAGNOSIS — L409 Psoriasis, unspecified: Secondary | ICD-10-CM | POA: Diagnosis not present

## 2020-04-08 DIAGNOSIS — G4709 Other insomnia: Secondary | ICD-10-CM

## 2020-04-08 DIAGNOSIS — Z8669 Personal history of other diseases of the nervous system and sense organs: Secondary | ICD-10-CM

## 2020-04-08 DIAGNOSIS — R778 Other specified abnormalities of plasma proteins: Secondary | ICD-10-CM

## 2020-04-08 DIAGNOSIS — M5134 Other intervertebral disc degeneration, thoracic region: Secondary | ICD-10-CM

## 2020-04-08 DIAGNOSIS — M8589 Other specified disorders of bone density and structure, multiple sites: Secondary | ICD-10-CM

## 2020-04-08 DIAGNOSIS — Z7902 Long term (current) use of antithrombotics/antiplatelets: Secondary | ICD-10-CM

## 2020-04-08 DIAGNOSIS — M24522 Contracture, left elbow: Secondary | ICD-10-CM

## 2020-04-08 DIAGNOSIS — Z8719 Personal history of other diseases of the digestive system: Secondary | ICD-10-CM

## 2020-04-08 NOTE — Patient Instructions (Signed)
Standing Labs We placed an order today for your standing lab work.    Please come back and get your standing labs in  1 month then every 3 months   We have open lab daily Monday through Thursday from 8:30-12:30 PM and 1:30-4:30 PM and Friday from 8:30-12:30 PM and 1:30-4:00 PM at the office of Dr. Bo Merino.   You may experience shorter wait times on Monday and Friday afternoons. The office is located at [address], [place], [place], [place] [address] No appointment is necessary.   Labs are drawn by [facility].  You may receive a bill from [facility] for your lab work.  If you wish to have your labs drawn at another location, please call the office 24 hours in advance to send orders.  If you have any questions regarding directions or hours of operation,  please call [phone].   Just as a reminder please drink plenty of water prior to coming for your lab work. Thanks!    Sulfasalazine tablets What is this medicine? SULFASALAZINE (sul fa SAL a zeen) is used to treat ulcerative colitis. This medicine may be used for other purposes; ask your health care provider or pharmacist if you have questions. COMMON BRAND NAME(S): Azulfidine, Sulfazine What should I tell my health care provider before I take this medicine? They need to know if you have any of these conditions:  asthma  blood disorders or anemia  glucose-6-phosphate dehydrogenase (G6PD) deficiency  intestinal obstruction  kidney disease  liver disease  porphyria  urinary tract obstruction  an unusual reaction to sulfasalazine, sulfa drugs, salicylates, or other medicines, foods, dyes, or preservatives  pregnant or trying to get pregnant  breast-feeding How should I use this medicine? Take this medicine by mouth with a full glass of water. Follow the directions on the prescription label. If the medicine upsets your stomach, take it with food or milk. Take your medicine at regular intervals. Do not take  your medicine more often than directed. Do not stop taking except on your doctor's advice. Talk to your pediatrician regarding the use of this medicine in children. While this drug may be prescribed for children as young as 6 years for selected conditions, precautions do apply. Patients over 67 years old may have a stronger reaction and need a smaller dose. Overdosage: If you think you have taken too much of this medicine contact a poison control center or emergency room at once. NOTE: This medicine is only for you. Do not share this medicine with others. What if I miss a dose? If you miss a dose, take it as soon as you can. If it is almost time for your next dose, take only that dose. Do not take double or extra doses. What may interact with this medicine?  digoxin  folic acid This list may not describe all possible interactions. Give your health care provider a list of all the medicines, herbs, non-prescription drugs, or dietary supplements you use. Also tell them if you smoke, drink alcohol, or use illegal drugs. Some items may interact with your medicine. What should I watch for while using this medicine? Visit your doctor or health care professional for regular checks on your progress. You will need frequent blood and urine checks. This medicine can make you more sensitive to the sun. Keep out of the sun. If you cannot avoid being in the sun, wear protective clothing and use sunscreen. Do not use sun lamps or tanning beds/booths. Drink plenty of water while  taking this medicine. What side effects may I notice from receiving this medicine? Side effects that you should report to your doctor or health care professional as soon as possible:  allergic reactions like skin rash, itching or hives, swelling of the face, lips, or tongue  fever, chills, or any other sign of infection  painful, difficult, or reduced urination  redness, blistering, peeling or loosening of the skin, including inside  the mouth  severe stomach pain  unusual bleeding or bruising  unusually weak or tired  yellowing of the skin or eyes Side effects that usually do not require medical attention (report to your doctor or health care professional if they continue or are bothersome):  headache  loss of appetite  nausea, vomiting  orange color to the urine  reduced sperm count This list may not describe all possible side effects. Call your doctor for medical advice about side effects. You may report side effects to FDA at 1-800-FDA-1088. Where should I keep my medicine? Keep out of the reach of children. Store at room temperature between 15 and 30 degrees C (59 and 86 degrees F). Keep container tightly closed. Throw away any unused medicine after the expiration date. NOTE: This sheet is a summary. It may not cover all possible information. If you have questions about this medicine, talk to your doctor, pharmacist, or health care provider.  2020 Elsevier/Gold Standard (2008-08-18 11:38:15)

## 2020-04-11 ENCOUNTER — Other Ambulatory Visit: Payer: Self-pay | Admitting: Cardiovascular Disease

## 2020-04-11 ENCOUNTER — Telehealth: Payer: Self-pay | Admitting: *Deleted

## 2020-04-11 ENCOUNTER — Encounter: Payer: Self-pay | Admitting: Rheumatology

## 2020-04-11 DIAGNOSIS — I10 Essential (primary) hypertension: Secondary | ICD-10-CM

## 2020-04-11 LAB — GLUCOSE 6 PHOSPHATE DEHYDROGENASE: G-6PDH: 14.8 U/g Hgb (ref 7.0–20.5)

## 2020-04-11 MED ORDER — PREDNISONE 5 MG PO TABS: ORAL_TABLET | ORAL | 0 refills | Status: DC

## 2020-04-11 MED ORDER — SULFASALAZINE 500 MG PO TABS: 1000.0000 mg | ORAL_TABLET | Freq: Two times a day (BID) | ORAL | 0 refills | Status: DC

## 2020-04-11 MED ORDER — POTASSIUM CHLORIDE ER 10 MEQ PO TBCR: 10.0000 meq | EXTENDED_RELEASE_TABLET | Freq: Every day | ORAL | 3 refills | Status: DC

## 2020-04-11 MED ORDER — FUROSEMIDE 40 MG PO TABS: 40.0000 mg | ORAL_TABLET | Freq: Every day | ORAL | 3 refills | Status: DC | PRN

## 2020-04-11 NOTE — Progress Notes (Signed)
G6PD WNL.  Please send in prescription for sulfasalazine as discussed.   Sulfasalazine 500 mg 2 tablets by mouth twice daily.

## 2020-04-11 NOTE — Telephone Encounter (Signed)
Per lab note: Sulfasalazine 500 mg 2 tablets by mouth twice daily.

## 2020-04-11 NOTE — Telephone Encounter (Signed)
Ok to send in prednisone taper starting at 10 mg tapering by 2.5 mg every 4 days.  Please advise the patient to monitor blood glucose very closely while taking prednisone.

## 2020-04-18 ENCOUNTER — Other Ambulatory Visit: Payer: Self-pay | Admitting: Physician Assistant

## 2020-04-18 ENCOUNTER — Ambulatory Visit
Admission: EM | Admit: 2020-04-18 | Discharge: 2020-04-18 | Disposition: A | Discharge status: Home / Self Care | Payer: 59 | Attending: Urgent Care | Admitting: Urgent Care

## 2020-04-18 ENCOUNTER — Other Ambulatory Visit: Payer: Self-pay | Admitting: Rheumatology

## 2020-04-18 DIAGNOSIS — R21 Rash and other nonspecific skin eruption: Secondary | ICD-10-CM

## 2020-04-18 DIAGNOSIS — L409 Psoriasis, unspecified: Secondary | ICD-10-CM

## 2020-04-18 DIAGNOSIS — D849 Immunodeficiency, unspecified: Secondary | ICD-10-CM

## 2020-04-18 DIAGNOSIS — R208 Other disturbances of skin sensation: Secondary | ICD-10-CM

## 2020-04-18 DIAGNOSIS — M069 Rheumatoid arthritis, unspecified: Secondary | ICD-10-CM

## 2020-04-18 MED ORDER — VALACYCLOVIR HCL 1 G PO TABS: 1000.0000 mg | ORAL_TABLET | Freq: Three times a day (TID) | ORAL | 0 refills | Status: AC

## 2020-04-18 NOTE — ED Triage Notes (Signed)
Pt presents with c/o pain to the right elbow, x1 week, that has progressed into a rash in the area since yesterday. Pt states the rash area does burn, denies itching. Pt has an area of small bumps around the elbow and pain radiates down her arm and up into her shoulder. Pt may have had an episode of possible shingles on her back about a year ago, was not confirmed. Pt denies fever/chills or other symptoms.

## 2020-04-18 NOTE — Telephone Encounter (Signed)
Last Visit: 04/08/20 Next Visit: 05/16/20  Okay to refill per Dr. Estanislado Pandy

## 2020-04-18 NOTE — Telephone Encounter (Signed)
Ok to refill Arava

## 2020-04-18 NOTE — Telephone Encounter (Signed)
Last Visit: 04/08/20 Next Visit: 05/16/20 Labs: cbc 02/15/20 WNL BMP Glucose 224  Okay to refill Arava?

## 2020-04-18 NOTE — ED Provider Notes (Signed)
[name]   MRN: [id number] DOB: February 25, 1966  Subjective:   [name] is a 54 y.o. female presenting for 1 week history of persistent painful rash over her right elbow.  Patient states that she actually underwent a steroid course for a rheumatoid arthritis flare over the same elbow.  She subsequently developed burning and stinging sensation over said elbow.  Pain can radiate down her forearm or up to her arm.  She had shingles about a year ago and states that it felt similar.  Denies drainage, contact with plants or other offending new exposures.  No current facility-administered medications for this encounter.  Current Outpatient Medications:  .  amLODipine (NORVASC) 5 MG tablet, Take 0.5 tablets (2.5 mg total) by mouth daily., Disp: 45 tablet, Rfl: 3 .  aspirin EC 81 MG tablet, Take 81 mg by mouth daily., Disp: , Rfl:  .  CHELATED MAGNESIUM PO, Take 250 mg by mouth daily., Disp: , Rfl:  .  Cholecalciferol (VITAMIN D) 125 MCG (5000 UT) CAPS, Take 5,000 Units by mouth daily., Disp: , Rfl:  .  clopidogrel (PLAVIX) 75 MG tablet, TAKE 1 TABLET BY MOUTH ONCE DAILY, Disp: 90 tablet, Rfl: 3 .  cyclobenzaprine (FLEXERIL) 10 MG tablet, TAKE 1 TABLET BY MOUTH AT BEDTIME., Disp: 30 tablet, Rfl: 0 .  Diclofenac Potassium (CAMBIA) 50 MG PACK, Take 50 mg by mouth as needed., Disp: 12 each, Rfl: 11 .  EPINEPHrine 0.3 mg/0.3 mL IJ SOAJ injection, Inject 0.3 mLs (0.3 mg total) into the muscle as needed for anaphylaxis., Disp: 1 Device, Rfl: prn .  folic acid (FOLVITE) 1 MG tablet, .AKE 2 TABLETS (2 MG TOTAL) BY MOUTH DAILY., Disp: 180 tablet, Rfl: 1 .  furosemide (LASIX) 40 MG tablet, Take 1 tablet (40 mg total) by mouth daily as needed for edema., Disp: 90 tablet, Rfl: 3 .  Golimumab (SIMPONI ARIA IV), Inject into the vein., Disp: , Rfl:  .  hyoscyamine (LEVSIN) 0.125 MG tablet, Take 1 tablet (0.125 mg total) by mouth 3 (three) times daily as needed., Disp: 90 tablet, Rfl: 2 .  insulin  lispro (HUMALOG) 100 UNIT/ML injection, Inject 80-90 Units into the skin continuous. FOR USE IN INSULIN PUMP. TOTAL DAILY INSULIN DOSE = UP TO 90 UNITS., Disp: , Rfl:  .  isosorbide mononitrate (IMDUR) 60 MG 24 hr tablet, TAKE 1 TABLET (60 MG TOTAL) BY MOUTH DAILY., Disp: 90 tablet, Rfl: 3 .  Krill Oil 500 MG CAPS, Take 500 mg by mouth daily., Disp: , Rfl:  .  leflunomide (ARAVA) 20 MG tablet, TAKE 1 TABLET BY MOUTH DAILY., Disp: 90 tablet, Rfl: 0 .  linaclotide (LINZESS) 290 MCG CAPS capsule, Take 1 capsule (290 mcg total) by mouth daily before breakfast., Disp: 90 capsule, Rfl: 3 .  losartan (COZAAR) 100 MG tablet, Take 1 tablet (100 mg total) by mouth daily., Disp: 30 tablet, Rfl: 11 .  metoprolol tartrate (LOPRESSOR) 50 MG tablet, TAKE 1 TABLET BY MOUTH TWICE DAILY, Disp: 180 tablet, Rfl: 3 .  montelukast (SINGULAIR) 10 MG tablet, Take 1 tablet (10 mg total) by mouth at bedtime., Disp: 90 tablet, Rfl: 3 .  nitroGLYCERIN (NITROSTAT) 0.4 MG SL tablet, Place 1 tablet (0.4 mg total) under the tongue every 5 (five) minutes as needed., Disp: 25 tablet, Rfl: 3 .  omeprazole (PRILOSEC) 40 MG capsule, , Disp: , Rfl:  .  ONE TOUCH ULTRA TEST test strip, , Disp: , Rfl: 1 .  pantoprazole (PROTONIX) 40 MG tablet, Take  1 tablet (40 mg total) by mouth daily., Disp: 30 tablet, Rfl: 11 .  potassium chloride (KLOR-CON) 10 MEQ tablet, Take 1 tablet (10 mEq total) by mouth daily., Disp: 90 tablet, Rfl: 3 .  predniSONE (DELTASONE) 5 MG tablet, Take 2 tabs po x  4 days, 1.5 tabs po x 4 days, 1 tab po x 4 days then 0.5 tab x 4 days ca, Disp: 20 tablet, Rfl: 0 .  promethazine (PHENERGAN) 25 MG tablet, Take 1 tablet (25 mg total) by mouth every 6 (six) hours as needed for nausea or vomiting., Disp: 20 tablet, Rfl: 0 .  rosuvastatin (CRESTOR) 20 MG tablet, Take 1 tablet (20 mg total) by mouth daily., Disp: 90 tablet, Rfl: 3 .  sulfaSALAzine (AZULFIDINE) 500 MG tablet, Take 2 tablets (1,000 mg total) by mouth 2 (two) times  daily., Disp: 120 tablet, Rfl: 0   Allergies  Allergen Reactions  . Actemra [Tocilizumab]   . Prochlorperazine Rash    Neuro problems per pt  . Ramipril Swelling, Rash and Other (See Comments)  . Shellfish-Derived Products Swelling    Shrimp   . Atorvastatin Rash    Elevated LFT's  . Compazine  [Prochlorperazine Edisylate] Other (See Comments)    Neurological reaction  . Emgality [Galcanezumab-Gnlm] Hives and Swelling  . Etanercept Swelling and Rash  . Infliximab Rash  . Iohexol     Iv contrast dye -rash all over  . Orencia [Abatacept] Rash  . Prochlorperazine Edisylate     unknown  . Shellfish Allergy Swelling    Shrimp(Facial swelling)  . Tofacitinib Rash and Other (See Comments)    Severe abdominal pain   . [employer]]     [employer], memory issues, word finding issues  . Amiodarone Nausea Only  . Rituximab Rash    Causes a rash    Past Medical History:  Diagnosis Date  . ACUT MI ANTEROLAT WALL SUBSQT EPIS CARE   . Acute maxillary sinusitis   . ALLERGIC RHINITIS, SEASONAL   . ARTHRITIS, RHEUMATOID    shoulders and hands Enbrel>leg swelling Dr. Estanislado Pandy  . Atrial fibrillation ([place])    a. after CABG.  . Back pain   . Bruxism (teeth grinding)   . CAD, ARTERY BYPASS GRAFT    a. DES to RCA in 2010 then LAD occlusion s/p CABG 3 06/07/2009 with LIMA to LAD, reverse SVG to D1, reverse SVG to distal RCA. b. Cath 05/08/2016 slightly hypodense region in the intermediate branch, however she had excellent flow, FFR was normal. Vein graft to PDA and the posterolateral branch is patent, patent LIMA to LAD, occluded SVG to diagonal.  . CARPAL TUNNEL SYNDROME, BILATERAL   . CHOLELITHIASIS   . Contrast media allergy   . DERMATITIS, ALLERGIC   . DIABETES MELLITUS, TYPE I    on insulin pump dx'ed age 68 y.o   . DIABETIC  RETINOPATHY   . Hiatal hernia   . HYPERLIPIDEMIA-MIXED   . HYPERTHYROIDISM    Dr. Dollene Cleveland  . Insulin pump in place   . MIGRAINE W/O AURA  W/INTRACT W/STATUS MIGRAINOSUS 02/19/2008  . Psoriasis   . SINUS TACHYCARDIA 11/08/2010  . SVT (supraventricular tachycardia) ([facility])    after s/p CABG  . TRIGGER FINGER    all fingers b/l hands   . URI      Past Surgical History:  Procedure Laterality Date  . ABDOMINAL HYSTERECTOMY     endometriomas b/l; total ? cervix removed; no h/o abnormal pap  .  Caesarean section    . CARDIAC CATHETERIZATION N/A 05/08/2016   Procedure: Left Heart Cath and Cors/Grafts Angiography;  Surgeon: Burnell Blanks, MD;  Location: [facility];  Service: Cardiovascular;  Laterality: N/A;  . CARPAL TUNNEL RELEASE     b/l   . CATARACT EXTRACTION, BILATERAL    . CHOLECYSTECTOMY    . COLONOSCOPY WITH PROPOFOL N/A 03/25/2020   Procedure: COLONOSCOPY WITH PROPOFOL;  Surgeon: Jonathon Bellows, MD;  Location[facility] ENDOSCOPY;  Service: Gastroenterology;  Laterality: N/A;  . CORONARY ARTERY BYPASS GRAFT    . ESOPHAGOGASTRODUODENOSCOPY (EGD) WITH PROPOFOL N/A 03/25/2020   Procedure: ESOPHAGOGASTRODUODENOSCOPY (EGD) WITH PROPOFOL;  Surgeon: Jonathon Bellows, MD;  Location: [facility] ENDOSCOPY;  Service: Gastroenterology;  Laterality: N/A;  . EYE SURGERY     laser x 2 for retinopathy   . LEFT HEART CATH AND CORS/GRAFTS ANGIOGRAPHY N/A 02/22/2020   Procedure: LEFT HEART CATH AND CORS/GRAFTS ANGIOGRAPHY;  Surgeon: Burnell Blanks, MD;  Location: [facility];  Service: Cardiovascular;  Laterality: N/A;  . TRIGGER FINGER RELEASE     b/l fingers all   . VITRECTOMY     b/l     Family History  Problem Relation Age of Onset  . Depression Mother   . Anxiety disorder Mother   . Colon polyps Father   . Diabetes Father        2  . Hypertension Father   . Parkinson's disease Father   . Diabetes Other   . Heart disease Other   . Hypertension Other   . Hyperlipidemia Other   . Depression Other   . Migraines Other   . Stroke Paternal Grandmother   . Heart attack Neg Hx   . Colon cancer Neg Hx   . Stomach  cancer Neg Hx   . Esophageal cancer Neg Hx     Social History   Tobacco Use  . Smoking status: Never Smoker  . Smokeless tobacco: Never Used  Substance Use Topics  . Alcohol use: Yes    Comment: rarely 1 every 6 months  . Drug use: No    ROS   Objective:   Vitals: BP 125/76 (BP Location: Left Arm)   Pulse 83   Temp 98.3 F (36.8 C) (Oral)   Resp 18   Ht 5\' 3"  (1.6 m)   Wt 170 lb (77.1 kg)   SpO2 96%   BMI 30.11 kg/m   Physical Exam Constitutional:      General: She is not in acute distress.    Appearance: Normal appearance. She is well-developed. She is not ill-appearing, toxic-appearing or diaphoretic.  HENT:     Head: Normocephalic and atraumatic.     Nose: Nose normal.     Mouth/Throat:     Mouth: Mucous membranes are moist.     Pharynx: Oropharynx is clear.  Eyes:     General: No scleral icterus.       Right eye: No discharge.        Left eye: No discharge.     Extraocular Movements: Extraocular movements intact.     Pupils: Pupils are equal, round, and reactive to light.  Cardiovascular:     Rate and Rhythm: Normal rate.  Pulmonary:     Effort: Pulmonary effort is normal.  Skin:    General: Skin is warm and dry.     Findings: Rash (Cluster of painful nodules over elbow as depicted) present.  Neurological:     General: No focal deficit present.  Mental Status: She is alert and oriented to person, place, and time.  Psychiatric:        Mood and Affect: Mood normal.        Behavior: Behavior normal.        Thought Content: Thought content normal.        Judgment: Judgment normal.           Assessment and Plan :   PDMP not reviewed this encounter.  1. Rash and nonspecific skin eruption   2. Burning sensation of skin   3. Rheumatoid arthritis, involving unspecified site, unspecified whether rheumatoid factor present ([place])   4. Psoriasis   5. Immunocompromised ([place])     In the absence of any known offending agents that would lead to  an irritant/contact dermatitis, will cover for shingles with Valtrex. Counseled patient on potential for adverse effects with medications prescribed/recommended today, ER and return-to-clinic precautions discussed, patient verbalized understanding.    Jaynee Eagles, [place] 04/18/20 1812

## 2020-04-18 NOTE — Discharge Instructions (Addendum)
Lets use Valtrex to cover for shingles rash.  You can take this 3 times daily for the next 10 days.  Please maintain all of your medications.  Come back to our clinic if the rash worsens or fails to resolve.

## 2020-04-21 ENCOUNTER — Telehealth: Payer: 59 | Admitting: Nurse Practitioner

## 2020-04-28 ENCOUNTER — Telehealth: Payer: Self-pay | Admitting: Internal Medicine

## 2020-04-28 NOTE — Telephone Encounter (Signed)
Please advise 

## 2020-04-28 NOTE — Telephone Encounter (Signed)
Pt wants to get shingles shot. Pt states that she just got over shingles. She wants to schedule fasting labs on the same day which are already in the system. Please call back to advise

## 2020-04-29 ENCOUNTER — Telehealth: Payer: Self-pay | Admitting: Internal Medicine

## 2020-04-29 NOTE — Telephone Encounter (Signed)
sch fasting labs 05/23/20 or after

## 2020-04-29 NOTE — Telephone Encounter (Signed)
I would wait 90 days after having shingles before shingrix vaccines 2nd dose due in 2 to less than 73months of 1st   Ok to schedule fasting labs when due   [place]

## 2020-05-02 NOTE — Telephone Encounter (Signed)
Patient informed and verbalized understanding

## 2020-05-02 NOTE — Telephone Encounter (Signed)
Pt scheduled for fasting labs 05/06 at 9:00am.

## 2020-05-02 NOTE — Telephone Encounter (Signed)
Patient labs rescheduled for 05/24 at 9:15. Patient verbalized understanding

## 2020-05-05 ENCOUNTER — Other Ambulatory Visit: Payer: 59

## 2020-05-09 ENCOUNTER — Other Ambulatory Visit: Payer: Self-pay | Admitting: Rheumatology

## 2020-05-09 NOTE — Telephone Encounter (Signed)
Last Visit: 04/08/2020 Next Visit: 05/23/2020 Labs: 02/15/2020 CBC, BMP: glucose 224  Patient was to return for labs in 1 month after starting SSZ. I advised patient and patient would like to have labs drawn at scheduled appointment on 05/23/2020.   Okay to refill sulfasalazine and flexeril?

## 2020-05-09 NOTE — Telephone Encounter (Signed)
Ok to refill flexeril and 30-day supply of SSZ until she has updated lab work at follow up visit on 05/23/20.

## 2020-05-11 NOTE — Progress Notes (Signed)
Office Visit Note  Patient: [name]             Date of Birth: 30-Oct-1966           MRN: [id number]             PCP: McLean-Scocuzza, Nino Glow, MD Referring: Orland Mustard * Visit Date: 05/23/2020 Occupation: @GUAROCC @  Subjective:  Pain in multiple joints   History of Present Illness: [name] is a 54 y.o. female with history of seronegative rheumatoid arthritis and DDD.  [name] is on Symphony Aria IV infusions, sulfasalazine 500 mg 2 tablets by mouth twice daily, and Arava 20 mg 1 tablet by mouth daily.  Her last Simponi infusion was on 05/12/2020 and [name] is scheduled for her next infusion on 06/09/2020.  [name] has been taking sulfasalazine for the past 6 weeks but has not noticed any improvement.  [name] states that her rheumatoid arthritis seems to be worsening.  [name] has pain and inflammation in multiple joints including the right shoulder, right elbow, right wrist, both hands, both knees, and both ankles.  [name] has been off of prednisone for about 1 month and has been taking ibuprofen at bedtime for pain relief.  [name] states that [name] has some psoriasis on both elbows and both knees but has not been using topical agents recently.  [name] experiences increased lower back pain if [name] stands for prolonged periods of time but this has improved since buying new shoes.    Activities of Daily Living:  Patient reports morning stiffness for 2  hours.   Patient Reports nocturnal pain.  Difficulty dressing/grooming: Reports Difficulty climbing stairs: Denies Difficulty getting out of chair: Reports Difficulty using hands for taps, buttons, cutlery, and/or writing: Reports  Review of Systems  Constitutional: Positive for fatigue.  HENT: Positive for mouth dryness. Negative for mouth sores and nose dryness.   Eyes: Positive for dryness. Negative for pain and visual disturbance.  Respiratory: Negative for cough, hemoptysis, shortness of breath and difficulty breathing.   Cardiovascular:  Positive for swelling in legs/feet. Negative for chest pain, palpitations and hypertension.  Gastrointestinal: Positive for constipation and diarrhea. Negative for blood in stool.  Endocrine: Negative for increased urination.  Genitourinary: Negative for difficulty urinating and painful urination.  Musculoskeletal: Positive for arthralgias, joint pain, joint swelling, morning stiffness and muscle tenderness. Negative for myalgias, muscle weakness and myalgias.  Skin: Positive for rash. Negative for color change, pallor, hair loss, nodules/bumps, skin tightness, ulcers and sensitivity to sunlight.  Allergic/Immunologic: Negative for susceptible to infections.  Neurological: Negative for dizziness and headaches.  Hematological: Negative for swollen glands.  Psychiatric/Behavioral: Positive for sleep disturbance. Negative for depressed mood. The patient is not nervous/anxious.     PMFS History:  Patient Active Problem List   Diagnosis Date Noted  . Snoring 05/12/2020  . Lung nodule 03/10/2020  . Colitis 03/10/2020  . Type 1 diabetes mellitus with proliferative retinopathy ([place]) 12/10/2019  . Abnormal thyroid function test 12/10/2019  . Bruxism (teeth grinding)   . Chronic migraine 10/07/2019  . Contusion of left hip 07/25/2019  . H/O multiple allergies 03/10/2019  . Hx of anaphylaxis 03/10/2019  . Cough 06/26/2018  . PND (post-nasal drip) 06/26/2018  . Vitamin D deficiency 07/04/2017  . B12 deficiency 07/04/2017  . Abnormal CT scan 06/19/2017  . Gastroesophageal reflux disease 06/19/2017  . Antiplatelet or antithrombotic long-term use 06/19/2017  . Nasal congestion 03/15/2017  . Graves disease 02/01/2017  . Sleep difficulties 02/01/2017  .  No energy 02/01/2017  . Osteopenia 02/01/2017  . Hypertension, essential 05/08/2016  . IDDM (insulin dependent diabetes mellitus) 05/08/2016  . Coronary artery disease involving native coronary artery of native heart with angina pectoris ([place])     . Hyperthyroidism 11/09/2010  . SINUS TACHYCARDIA 11/08/2010  . DERMATITIS, ALLERGIC 07/20/2010  . EDEMA 07/12/2010  . DIZZINESS 11/14/2009  . ATRIAL FIBRILLATION 07/20/2009  . CAD, ARTERY BYPASS GRAFT 06/07/2009  . ANGINA, STABLE/EXERTIONAL 06/02/2009  . HYPERLIPIDEMIA-MIXED 04/26/2009  . ACUT MI ANTEROLAT WALL SUBSQT EPIS CARE 04/26/2009  . Chest pain 04/26/2009  . DIABETIC  RETINOPATHY 04/25/2009  . CARPAL TUNNEL SYNDROME, BILATERAL 04/25/2009  . TRIGGER FINGER 04/25/2009  . MIGRAINE W/O AURA W/INTRACT W/STATUS MIGRAINOSUS 02/19/2008  . ALLERGIC RHINITIS, SEASONAL 10/16/2006  . CHOLELITHIASIS 10/16/2006  . Rheumatoid arthritis ([place]) 10/16/2006    Past Medical History:  Diagnosis Date  . ACUT MI ANTEROLAT WALL SUBSQT EPIS CARE   . Acute maxillary sinusitis   . ALLERGIC RHINITIS, SEASONAL   . ARTHRITIS, RHEUMATOID    shoulders and hands Enbrel>leg swelling Dr. Estanislado Pandy  . Atrial fibrillation ([place])    a. after CABG.  . Back pain   . Bruxism (teeth grinding)   . CAD, ARTERY BYPASS GRAFT    a. DES to RCA in 2010 then LAD occlusion s/p CABG 3 06/07/2009 with LIMA to LAD, reverse SVG to D1, reverse SVG to distal RCA. b. Cath 05/08/2016 slightly hypodense region in the intermediate branch, however [name] had excellent flow, FFR was normal. Vein graft to PDA and the posterolateral branch is patent, patent LIMA to LAD, occluded SVG to diagonal.  . CARPAL TUNNEL SYNDROME, BILATERAL   . CHOLELITHIASIS   . Contrast media allergy   . DERMATITIS, ALLERGIC   . DIABETES MELLITUS, TYPE I    on insulin pump dx'ed age 67 y.o   . DIABETIC  RETINOPATHY   . Hiatal hernia   . HYPERLIPIDEMIA-MIXED   . HYPERTHYROIDISM    Dr. Dollene Cleveland  . Insulin pump in place   . MIGRAINE W/O AURA W/INTRACT W/STATUS MIGRAINOSUS 02/19/2008  . Psoriasis   . SINUS TACHYCARDIA 11/08/2010  . SVT (supraventricular tachycardia) (H[facility])    after s/p CABG  . TRIGGER FINGER    all fingers b/l hands   . URI      Family History  Problem Relation Age of Onset  . Depression Mother   . Anxiety disorder Mother   . Colon polyps Father   . Diabetes Father        2  . Hypertension Father   . Parkinson's disease Father   . Diabetes Other   . Heart disease Other   . Hypertension Other   . Hyperlipidemia Other   . Depression Other   . Migraines Other   . Stroke Paternal Grandmother   . Heart attack Neg Hx   . Colon cancer Neg Hx   . Stomach cancer Neg Hx   . Esophageal cancer Neg Hx    Past Surgical History:  Procedure Laterality Date  . ABDOMINAL HYSTERECTOMY     endometriomas b/l; total ? cervix removed; no h/o abnormal pap  . Caesarean section    . CARDIAC CATHETERIZATION N/A 05/08/2016   Procedure: Left Heart Cath and Cors/Grafts Angiography;  Surgeon: Burnell Blanks, MD;  Location: [facility];  Service: Cardiovascular;  Laterality: N/A;  . CARPAL TUNNEL RELEASE     b/l   . CATARACT EXTRACTION, BILATERAL    . CHOLECYSTECTOMY    .  COLONOSCOPY WITH PROPOFOL N/A 03/25/2020   Procedure: COLONOSCOPY WITH PROPOFOL;  Surgeon: Jonathon Bellows, MD;  Location: [facility] ENDOSCOPY;  Service: Gastroenterology;  Laterality: N/A;  . CORONARY ARTERY BYPASS GRAFT    . ESOPHAGOGASTRODUODENOSCOPY (EGD) WITH PROPOFOL N/A 03/25/2020   Procedure: ESOPHAGOGASTRODUODENOSCOPY (EGD) WITH PROPOFOL;  Surgeon: Jonathon Bellows, MD;  Location[facility]COPY;  Service: Gastroenterology;  Laterality: N/A;  . EYE SURGERY     laser x 2 for retinopathy   . LEFT HEART CATH AND CORS/GRAFTS ANGIOGRAPHY N/A 02/22/2020   Procedure: LEFT HEART CATH AND CORS/GRAFTS ANGIOGRAPHY;  Surgeon: Burnell Blanks, MD;  Location: [facility];  Service: Cardiovascular;  Laterality: N/A;  . TRIGGER FINGER RELEASE     b/l fingers all   . VITRECTOMY     b/l    Social History   Social History Narrative   Divorced. Has 2 kids(fraternal twins) daughters age 6. Works at [employer], Never smoked, denies ETOH, no  drugs. Drinks diet coke. No exercise.       DPR daughters [name] and Trinda Milonas twins          Immunization History  Administered Date(s) Administered  . Influenza, Quadrivalent, Recombinant, Inj, Pf 10/14/2013  . Influenza, Seasonal, Injecte, Preservative Fre 09/19/2016  . Influenza,inj,Quad PF,6+ Mos 09/30/2014, 09/03/2018  . Influenza,inj,quad, With Preservative 09/23/2017  . Influenza-Unspecified 09/30/2014, 09/30/2016, 09/03/2018, 09/10/2019  . PFIZER SARS-COV-2 Vaccination 12/28/2019, 01/18/2020  . Pneumococcal Conjugate-13 07/16/2016  . Pneumococcal Polysaccharide-23 01/01/2004, 10/15/2006, 01/04/2020  . Td 10/01/2003  . Tdap 01/09/2011, 06/25/2018     Objective: Vital Signs: BP 105/61 (BP Location: Left Arm, Patient Position: Sitting, Cuff Size: Normal)   Pulse 61   Resp 16   Ht 5\' 3"  (1.6 m)   Wt 170 lb 3.2 oz (77.2 kg)   BMI 30.15 kg/m    Physical Exam Vitals and nursing note reviewed.  Constitutional:      Appearance: [name] is well-developed.  HENT:     Head: Normocephalic and atraumatic.  Eyes:     Conjunctiva/sclera: Conjunctivae normal.  Pulmonary:     Effort: Pulmonary effort is normal.  Abdominal:     General: Bowel sounds are normal.     Palpations: Abdomen is soft.  Musculoskeletal:     Cervical back: Normal range of motion.  Lymphadenopathy:     Cervical: No cervical adenopathy.  Skin:    General: Skin is warm and dry.     Capillary Refill: Capillary refill takes less than 2 seconds.  Neurological:     Mental Status: [name] is alert and oriented to person, place, and time.  Psychiatric:        Behavior: Behavior normal.      Musculoskeletal Exam: C-spine, thoracic spine, and lumbar spine good ROM.  Right shoulder has painful and limited ROM with abduction and internal rotation. Left shoulder has good ROM with no discomfort.  Bilateral elbow joint contractures.  Tenderness of the right elbow joint on exam.  Tenderness and inflammation of the  right wrist joint.  Tenderness and synovitis of the right 1st and 2nd MCPs and 3rd PIP.  Tenderness of the right 4th and 5th MCPs and left 1st and 4th MCP joints. Painful and limited ROM of both hip joints.  Tenderness over bilateral trochanteric bursitis.  Knee joints good ROM with no warmth or effusion.  Bilateral knee crepitus.  Tenderness of both ankle joints noted.  No tenderness of MTP joints.   CDAI Exam: CDAI Score: 15.4  Patient Global:  7 mm; Provider Global: 7 mm Swollen: 4 ; Tender: 12  Joint Exam 05/23/2020      Right  Left  Glenohumeral   Tender     Elbow   Tender     Wrist  Swollen Tender     MCP 1  Swollen Tender   Tender  MCP 2  Swollen Tender     MCP 4   Tender   Tender  MCP 5   Tender     PIP 3  Swollen Tender     Ankle   Tender   Tender     Investigation: No additional findings.  Imaging: No results found.  Recent Labs: Lab Results  Component Value Date   WBC 4.9 02/15/2020   HGB 13.4 02/15/2020   PLT 172 02/15/2020   NA 140 02/15/2020   K 4.4 02/15/2020   CL 103 02/15/2020   CO2 21 02/15/2020   GLUCOSE 224 (H) 02/15/2020   BUN 9 02/15/2020   CREATININE 0.80 02/15/2020   BILITOT 0.6 11/17/2019   ALKPHOS 83 10/29/2019   AST 24 11/17/2019   ALT 21 11/17/2019   PROT 5.8 (L) 11/17/2019   ALBUMIN 3.6 10/29/2019   CALCIUM 9.3 02/15/2020   GFRAA 98 02/15/2020   QFTBGOLDPLUS NEGATIVE 10/07/2019    Speciality Comments: PLQ Eye Exam: 01/06/2020 ABNORMAL @ [facility]. Patient advised to discontinue PLQ at office visit on 01/06/2020.  Prior Therapy: methotrexate/Xeljanz/Actemra (GI upset), Enbrel (injection site reaction) and Orencia/Remicade/Humira/Cimzia/Rituxan (inadequate response).  Procedures:  No procedures performed Allergies: Actemra [tocilizumab], Prochlorperazine, Ramipril, Shellfish-derived products, Atorvastatin, Compazine  [prochlorperazine edisylate], Emgality [galcanezumab-gnlm], Etanercept, Infliximab, Iohexol,  Orencia [abatacept], Prochlorperazine edisylate, Shellfish allergy, Tofacitinib, Trokendi xr [topiramate er], Amiodarone, and Rituximab      Assessment / Plan:     Visit Diagnoses: Rheumatoid arthritis of multiple sites with negative rheumatoid factor (HCC) - RF negative, anti-CCP negative, positive ANA: [name] has ongoing tenderness and synovitis of multiple joints.  [name] has tenderness of the right shoulder joint and right elbow.  Tenderness and synovitis of the right wrist, right 1st and 2nd MCP joints, and right 3rd PIP joint.  [name] has been experiencing increased pain in both hip joint and both knees joints.  [name] has limited ROM of both hip joints.  Bilateral knee crepitus noted.  [name] has tenderness of both ankle joints.  [name] has been experiencing increased joint stiffness, nocturnal pain, and difficulty with ADLs.  [name] has not noticed any improvement since starting on Sulfasalazine 500 mg 2 tablets by mouth twice daily 6 weeks ago.  [name] continues to take arava 20 mg 1 tablet by mouth daily and Simponi Aria IV infusions (most recent infusion on 05/12/20).  [name] is tolerating these medications without any side effects.  [name] had a recent shingles outbreak and was prescribed acyclovir, which [name] completed.  [name] has not had any other recent infections.  According to the patient [name] was clinically doing better while on Simponi Aria infusions, Plaquenil, and Relafen.  [name] had to discontinue Plaquenil due to an abnormal eye exam in January 2021.  [name] discontinued Relafen due to developing colitis.  In the past [name] discontinued methotrexate, Morrie Sheldon, and Actemra due to GI upset and discontinued Enbrel due to injection site reactions.  [name] had an inadequate response to Orencia, Remicade, Humira, Cimzia, and Rituxan.  Her treatment options are limited, but we discussed trying Rinvoq 15 mg 1 tablet by mouth daily.  Indications, contraindications, and potential side effects of  Rinvoq were discussed today.  All questions  were addressed and consent was obtained.  [name] was given a sample of Rinvoq today, and [name] was advised to wait to start until her updated lab work is back tomorrow.  [name] will discontinue Simponi Aria infusions and Sulfasalazine at this time.  [name] will continue taking arava and we will add on prednisone 10 mg po daily.  [name] will follow up in 6 weeks.   Counseled patient that Rinvoq is a JAK inhibitor indicated for Rheumatoid Arthritis.  Counseled patient on purpose, proper use, and adverse effects of Rinvoq.    Reviewed the most common adverse effects including infection, diarrhea, headaches.  Also reviewed rare adverse effects such as bowel injury and the need to contact us if they develop stomach pain during treatment. Counseled on the increase risk of venous thrombosis. Reviewed with patient that there is the possibility of an increased risk of malignancy but it is not well understood if this increased risk is due to the medication or the disease state.  Instructed patient that medication should be held for infection and prior to surgery.  Advised patient to avoid live vaccines. Recommend annual influenza, Pneumovax 23, Prevnar 13, and Shingrix as indicated.   Reviewed importance of routine lab monitoring including lipid panel.  Standing orders placed. Provided patient with medication education material and answered all questions.  Patient consented to Rinvoq.  Will upload into patient's chart.  Will apply through patient's insurance and update when we receive a response.    Patient dose will be 15 mg daily.  Prescription will be sent to pharmacy pending lab results and insurance approval.  High risk medication use -We will apply for Rinvoq 15 mg 1 tablet daily and [name] will continue taking arava 20 mg po daily.  [name] will start on prednisone 10 mg po daily. Inadequate response to Simponi Aria IV infusions and sulfasalazine 500 mg 2 tablets by mouth twice daily.  CBC and CMP were drawn today on 05/23/2020.   [name] will be due to update lab work in 1 month then every 3 months.  TB gold negative on 10/07/2019.  Psoriasis: [name] has psoriasis on the extensor surface of both elbows and both knees.  [name] was encouraged to use topical agents as needed.  Contracture of joint of both elbows: [name] has tenderness and intermittent inflammation in the right elbow joint.  Contractures of both elbows are unchanged.   Other insomnia - [name] is experiencing severe nocturnal pain.  [name] takes ibuprofen at bedtime to help her sleep as well as Flexeril 10 mg by mouth at bedtime for muscle spasms and insomnia.   Other fatigue: [name] has been experiencing increased fatigue due to have recurrent flares and nocturnal pain.   Osteopenia of multiple sites - DEXA on 11/11/19: Left femur neck BMD 0.901 with T-score -1.0.  [name] is taking vitamin D 5000 units daily.  DDD (degenerative disc disease), lumbar: [name] experiences intermittent lower back pain which is exacerbated by standing for long periods of time.  [name] takes Flexeril 10 mg a mouth at bedtime to help with muscle spasms.  DDD (degenerative disc disease), thoracic: [name] has no discomfort at this time.   History of vitamin D deficiency: [name] is taking vitamin D 5,000 units daily.   Other medical conditions are listed as follows:   Abnormal SPEP  History of hypertension  History of hyperlipidemia  History of coronary artery disease  History of Graves' disease  History of gastroesophageal reflux (GERD)  History of diabetes mellitus  History of migraine  Antiplatelet or antithrombotic long-term use - [name] is on Plavix.  Orders: No orders of the defined types were placed in this encounter.  No orders of the defined types were placed in this encounter.   Face-to-face time spent with patient was 30  minutes. Greater than 50% of time was spent in counseling and coordination of care.  Follow-Up Instructions: Return in about 6 weeks (around 07/04/2020) for Rheumatoid  arthritis.   Ofilia Neas, PA-C  Note - This record has been created using Dragon software.  Chart creation errors have been sought, but may not always  have been located. Such creation errors do not reflect on  the standard of medical care.

## 2020-05-12 ENCOUNTER — Other Ambulatory Visit: Payer: Self-pay

## 2020-05-12 ENCOUNTER — Encounter: Payer: Self-pay | Admitting: Neurology

## 2020-05-12 ENCOUNTER — Ambulatory Visit: Payer: 59 | Admitting: Neurology

## 2020-05-12 VITALS — BP 127/73 | HR 68 | Temp 97.5°F | Ht 63.0 in | Wt 169.0 lb

## 2020-05-12 DIAGNOSIS — R0683 Snoring: Secondary | ICD-10-CM | POA: Diagnosis not present

## 2020-05-12 DIAGNOSIS — IMO0002 Reserved for concepts with insufficient information to code with codable children: Secondary | ICD-10-CM

## 2020-05-12 DIAGNOSIS — G43709 Chronic migraine without aura, not intractable, without status migrainosus: Secondary | ICD-10-CM

## 2020-05-12 DIAGNOSIS — M069 Rheumatoid arthritis, unspecified: Secondary | ICD-10-CM | POA: Diagnosis not present

## 2020-05-12 NOTE — Progress Notes (Signed)
I have reviewed and agreed above plan. 

## 2020-05-12 NOTE — Patient Instructions (Signed)
It was great meet you today! I am going to send you for sleep evaluation  See you back in 4 months

## 2020-05-12 NOTE — Progress Notes (Signed)
PATIENT: [name] DOB: 08/01/1966  REASON FOR VISIT: follow up HISTORY FROM: patient  HISTORY OF PRESENT ILLNESS: Today 05/12/20  HISTORY Chlo… Bleak Jonesis a 54 year old female, seen in request byher primary care PA Marcos Eke evaluation of chronic migraine headache, initial evaluation was on October 07, 2019.  I have reviewed and summarized the referring note from the referring physician.She had past medical history of insulin-dependent diabetes, hypertension, hyperlipidemia, coronary artery disease, rheumatoid arthritis, psoriatic arthritis, on polypharmacy treatment  She reported a history of migraine headaches since teenager, her typical migraine bilateral retro-orbital area vertex area severe pounding headache with associated light noise sensitivity, nauseous, lasting for couple hours, recently she been having typical migraine once a week, but in between, she also has similar but milder degree of headaches, she often take ibuprofen as needed for migraine, not a candidate for triptan because of her chronic artery disease. Previously tried Topamax as preventive medications, she complains of side effect of mental slowing, Topamax was helpful. 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 She denies bilateral feet paresthesia,  Update January 13, 2020 SS: When last seen by [name], was started on lamotrigine, titrating to 100 mg twice a day, Cambia as needed.  She complained of joint pain with the medication, also has history of rheumatoid arthritis, psoriatic arthritis.  She decided to stay on Lamictal.  She has previously tried [employer] (had reaction of hives), Topamax, but had side effect of mental slowing.  She has not found Lamictal to be beneficial, reduce the dose to 50 mg twice a day, reports side effect of dizziness, stumbling, cannot find her words.  She says she is having 5 headaches a week, mild to moderate, only took Cambia once, was not  helpful.  She usually does not take any medication, does not miss work as result of headache.  With headache reports phonophobia, some nausea.  Her primary doctor mention the idea of sleep study, but infrequent snoring, no daytime drowsiness, patient does not think needed at this time.  Update May 12, 2020 SS: When last seen she was started on Effexor 37.5 mg at bedtime, stopped Lamictal.  She was unable to tolerate Effexor, due to elevated blood pressure.  She is no longer taking NSAIDs, as result of colitis.  She continues to report about 5 dull headaches a week, about 50% are  morning headaches.  The headaches are not significant enough to put her down, some may last all day.  She does not miss work.  She works as a Marine scientist.  In general, she does not sleep well, she usually gets up twice a night, she does snore (reported by her children, she sleeps alone with cats), she feels tired during the day.  She has had a recent bout of shingles to her right elbow. In the last year, has had 2 debilitating migraines, missing work.  With her headaches, she may have some photophobia, nausea, usually frontal, retro orbital. PCP has mentioned the idea of needing sleep study.   REVIEW OF SYSTEMS: Out of a complete 14 system review of symptoms, the patient complains only of the following symptoms, and all other reviewed systems are negative.  Headache   ALLERGIES: Allergies  Allergen Reactions  . Actemra [Tocilizumab]   . Prochlorperazine Rash    Neuro problems per pt  . Ramipril Swelling, Rash and Other (See Comments)  . Shellfish-Derived Products Swelling    Shrimp   . Atorvastatin Rash  Elevated LFT's  . Compazine  [Prochlorperazine Edisylate] Other (See Comments)    Neurological reaction  . Emgality [Galcanezumab-Gnlm] Hives and Swelling  . Etanercept Swelling and Rash  . Infliximab Rash  . Iohexol     Iv contrast dye -rash all over  . Orencia [Abatacept] Rash  . Prochlorperazine Edisylate      unknown  . Shellfish Allergy Swelling    Shrimp(Facial swelling)  . Tofacitinib Rash and Other (See Comments)    Severe abdominal pain   . Trokendi [employer]]     [employer], memory issues, word finding issues  . Amiodarone Nausea Only  . Rituximab Rash    Causes a rash    HOME MEDICATIONS: Outpatient Medications Prior to Visit  Medication Sig Dispense Refill  . amLODipine (NORVASC) 5 MG tablet Take 0.5 tablets (2.5 mg total) by mouth daily. 45 tablet 3  . aspirin EC 81 MG tablet Take 81 mg by mouth daily.    . CHELATED MAGNESIUM PO Take 250 mg by mouth daily.    . Cholecalciferol (VITAMIN D) 125 MCG (5000 UT) CAPS Take 5,000 Units by mouth daily.    . clopidogrel (PLAVIX) 75 MG tablet TAKE 1 TABLET BY MOUTH ONCE DAILY 90 tablet 3  . cyclobenzaprine (FLEXERIL) 10 MG tablet TAKE 1 TABLET BY MOUTH AT BEDTIME 30 tablet 0  . EPINEPHrine 0.3 mg/0.3 mL IJ SOAJ injection Inject 0.3 mLs (0.3 mg total) into the muscle as needed for anaphylaxis. 1 Device prn  . folic acid (FOLVITE) 1 MG tablet .AKE 2 TABLETS (2 MG TOTAL) BY MOUTH DAILY. 180 tablet 1  . furosemide (LASIX) 40 MG tablet Take 1 tablet (40 mg total) by mouth daily as needed for edema. 90 tablet 3  . Golimumab ([place] ARIA IV) Inject into the vein.    . hyoscyamine (LEVSIN) 0.125 MG tablet Take 1 tablet (0.125 mg total) by mouth 3 (three) times daily as needed. 90 tablet 2  . insulin lispro (HUMALOG) 100 UNIT/ML injection Inject 80-90 Units into the skin continuous. FOR USE IN INSULIN PUMP. TOTAL DAILY INSULIN DOSE = UP TO 90 UNITS.    Marland Kitchen isosorbide mononitrate (IMDUR) 60 MG 24 hr tablet TAKE 1 TABLET (60 MG TOTAL) BY MOUTH DAILY. 90 tablet 3  . Krill Oil 500 MG CAPS Take 500 mg by mouth daily.    Marland Kitchen leflunomide (ARAVA) 20 MG tablet TAKE 1 TABLET BY MOUTH DAILY. 90 tablet 0  . linaclotide (LINZESS) 290 MCG CAPS capsule Take 1 capsule (290 mcg total) by mouth daily before breakfast. 90 capsule 3  . losartan (COZAAR) 100 MG  tablet Take 1 tablet (100 mg total) by mouth daily. 30 tablet 11  . metoprolol tartrate (LOPRESSOR) 50 MG tablet TAKE 1 TABLET BY MOUTH TWICE DAILY 180 tablet 3  . montelukast (SINGULAIR) 10 MG tablet Take 1 tablet (10 mg total) by mouth at bedtime. 90 tablet 3  . nitroGLYCERIN (NITROSTAT) 0.4 MG SL tablet Place 1 tablet (0.4 mg total) under the tongue every 5 (five) minutes as needed. 25 tablet 3  . omeprazole (PRILOSEC) 40 MG capsule     . ONE TOUCH ULTRA TEST test strip   1  . pantoprazole (PROTONIX) 40 MG tablet Take 1 tablet (40 mg total) by mouth daily. 30 tablet 11  . potassium chloride (KLOR-CON) 10 MEQ tablet Take 1 tablet (10 mEq total) by mouth daily. 90 tablet 3  . predniSONE (DELTASONE) 5 MG tablet Take 2 tabs po x  4  days, 1.5 tabs po x 4 days, 1 tab po x 4 days then 0.5 tab x 4 days ca 20 tablet 0  . promethazine (PHENERGAN) 25 MG tablet Take 1 tablet (25 mg total) by mouth every 6 (six) hours as needed for nausea or vomiting. 20 tablet 0  . rosuvastatin (CRESTOR) 20 MG tablet Take 1 tablet (20 mg total) by mouth daily. 90 tablet 3  . sulfaSALAzine (AZULFIDINE) 500 MG tablet TAKE 2 TABLETS (1,000 MG) BY MOUTH 2 TIMES DAILY. 120 tablet 0  . Diclofenac Potassium (CAMBIA) 50 MG PACK Take 50 mg by mouth as needed. 12 each 11   No facility-administered medications prior to visit.    PAST MEDICAL HISTORY: Past Medical History:  Diagnosis Date  . ACUT MI ANTEROLAT WALL SUBSQT EPIS CARE   . Acute maxillary sinusitis   . ALLERGIC RHINITIS, SEASONAL   . ARTHRITIS, RHEUMATOID    shoulders and hands Enbrel>leg swelling Dr. Estanislado Pandy  . Atrial fibrillation ([place])    a. after CABG.  . Back pain   . Bruxism (teeth grinding)   . CAD, ARTERY BYPASS GRAFT    a. DES to RCA in 2010 then LAD occlusion s/p CABG 3 06/07/2009 with LIMA to LAD, reverse SVG to D1, reverse SVG to distal RCA. b. Cath 05/08/2016 slightly hypodense region in the intermediate branch, however she had excellent flow, FFR was  normal. Vein graft to PDA and the posterolateral branch is patent, patent LIMA to LAD, occluded SVG to diagonal.  . CARPAL TUNNEL SYNDROME, BILATERAL   . CHOLELITHIASIS   . Contrast media allergy   . DERMATITIS, ALLERGIC   . DIABETES MELLITUS, TYPE I    on insulin pump dx'ed age 2 y.o   . DIABETIC  RETINOPATHY   . Hiatal hernia   . HYPERLIPIDEMIA-MIXED   . HYPERTHYROIDISM    Dr. Dollene Cleveland  . Insulin pump in place   . MIGRAINE W/O AURA W/INTRACT W/STATUS MIGRAINOSUS 02/19/2008  . Psoriasis   . SINUS TACHYCARDIA 11/08/2010  . SVT (supraventricular tachycardia) (HCC)    after s/p CABG  . TRIGGER FINGER    all fingers b/l hands   . URI     PAST SURGICAL HISTORY: Past Surgical History:  Procedure Laterality Date  . ABDOMINAL HYSTERECTOMY     endometriomas b/l; total ? cervix removed; no h/o abnormal pap  . Caesarean section    . CARDIAC CATHETERIZATION N/A 05/08/2016   Procedure: Left Heart Cath and Cors/Grafts Angiography;  Surgeon: Burnell Blanks, MD;  Location: [place] [facility];  Service: Cardiovascular;  Laterality: N/A;  . CARPAL TUNNEL RELEASE     b/l   . CATARACT EXTRACTION, BILATERAL    . CHOLECYSTECTOMY    . COLONOSCOPY WITH PROPOFOL N/A 03/25/2020   Procedure: COLONOSCOPY WITH PROPOFOL;  Surgeon: Jonathon Bellows, MD;  Location: [facility] ENDOSCOPY;  Service: Gastroenterology;  Laterality: N/A;  . CORONARY ARTERY BYPASS GRAFT    . ESOPHAGOGASTRODUODENOSCOPY (EGD) WITH PROPOFOL N/A 03/25/2020   Procedure: ESOPHAGOGASTRODUODENOSCOPY (EGD) WITH PROPOFOL;  Surgeon: Jonathon Bellows, MD;  Location: [facility] ENDOSCOPY;  Service: Gastroenterology;  Laterality: N/A;  . EYE SURGERY     laser x 2 for retinopathy   . LEFT HEART CATH AND CORS/GRAFTS ANGIOGRAPHY N/A 02/22/2020   Procedure: LEFT HEART CATH AND CORS/GRAFTS ANGIOGRAPHY;  Surgeon: Burnell Blanks, MD;  Location: [place] [facility];  Service: Cardiovascular;  Laterality: N/A;  . TRIGGER FINGER RELEASE     b/l fingers  all   .  VITRECTOMY     b/l     FAMILY HISTORY: Family History  Problem Relation Age of Onset  . Depression Mother   . Anxiety disorder Mother   . Colon polyps Father   . Diabetes Father        2  . Hypertension Father   . Parkinson's disease Father   . Diabetes Other   . Heart disease Other   . Hypertension Other   . Hyperlipidemia Other   . Depression Other   . Migraines Other   . Stroke Paternal Grandmother   . Heart attack Neg Hx   . Colon cancer Neg Hx   . Stomach cancer Neg Hx   . Esophageal cancer Neg Hx     SOCIAL HISTORY: Social History   Socioeconomic History  . Marital status: Divorced    Spouse name: Not on file  . Number of children: Not on file  . Years of education: Not on file  . Highest education level: Not on file  Occupational History  . Not on file  Tobacco Use  . Smoking status: Never Smoker  . Smokeless tobacco: Never Used  Substance and Sexual Activity  . Alcohol use: Yes    Comment: rarely 1 every 6 months  . Drug use: No  . Sexual activity: Not Currently    Partners: Male    Birth control/protection: None  Other Topics Concern  . Not on file  Social History Narrative   Divorced. Has 2 kids(fraternal twins) daughters age 49. Works at [employer], Never smoked, denies ETOH, no drugs. Drinks diet coke. No exercise.       DPR daughters [name] and Fadumo Rahill twins          Social Determinants of Health   Financial Resource Strain:   . Difficulty of Paying Living Expenses:   Food Insecurity:   . Worried About Charity fundraiser in the Last Year:   . Arboriculturist in the Last Year:   Transportation Needs:   . Film/video editor (Medical):   Marland Kitchen Lack of Transportation (Non-Medical):   Physical Activity:   . Days of Exercise per Week:   . Minutes of Exercise per Session:   Stress:   . Feeling of Stress :   Social Connections:   . Frequency of Communication with Friends and Family:   . Frequency of Social  Gatherings with Friends and Family:   . Attends Religious Services:   . Active Member of Clubs or Organizations:   . Attends Archivist Meetings:   Marland Kitchen Marital Status:   Intimate Partner Violence:   . Fear of Current or Ex-Partner:   . Emotionally Abused:   Marland Kitchen Physically Abused:   . Sexually Abused:    PHYSICAL EXAM  Vitals:   05/12/20 1357  BP: 127/73  Pulse: 68  Temp: (!) 97.5 F (36.4 C)  Weight: 169 lb (76.7 kg)  Height: 5\' 3"  (1.6 m)   Body mass index is 29.94 kg/m.  Generalized: Well developed, in no acute distress   Neurological examination  Mentation: Alert oriented to time, place, history taking. Follows all commands speech and language fluent Cranial nerve II-XII: Pupils were equal round reactive to light. Extraocular movements were full, visual field were full on confrontational test. Facial sensation and strength were normal. Head turning and shoulder shrug  were normal and symmetric. Motor: The motor testing reveals 5 over 5 strength of all 4 extremities. Good symmetric motor tone is  noted throughout.  Sensory: Sensory testing is intact to soft touch on all 4 extremities. No evidence of extinction is noted.  Coordination: Cerebellar testing reveals good finger-nose-finger and heel-to-shin bilaterally.  Gait and station: Gait is normal. Tandem gait is normal.  Reflexes: Deep tendon reflexes are symmetric and normal bilaterally.   DIAGNOSTIC DATA (LABS, IMAGING, TESTING) - I reviewed patient records, labs, notes, testing and imaging myself where available.  Lab Results  Component Value Date   WBC 4.9 02/15/2020   HGB 13.4 02/15/2020   HCT 38.9 02/15/2020   MCV 87 02/15/2020   PLT 172 02/15/2020      Component Value Date/Time   NA 140 02/15/2020 1656   K 4.4 02/15/2020 1656   CL 103 02/15/2020 1656   CO2 21 02/15/2020 1656   GLUCOSE 224 (H) 02/15/2020 1656   GLUCOSE 174 (H) 11/17/2019 1404   BUN 9 02/15/2020 1656   CREATININE 0.80 02/15/2020  1656   CREATININE 0.98 11/17/2019 1404   CALCIUM 9.3 02/15/2020 1656   PROT 5.8 (L) 11/17/2019 1404   ALBUMIN 3.6 10/29/2019 0918   AST 24 11/17/2019 1404   ALT 21 11/17/2019 1404   ALKPHOS 83 10/29/2019 0918   BILITOT 0.6 11/17/2019 1404   GFRNONAA 85 02/15/2020 1656   GFRNONAA 66 11/17/2019 1404   GFRAA 98 02/15/2020 1656   GFRAA 77 11/17/2019 1404   Lab Results  Component Value Date   CHOL 166 01/04/2020   HDL 78.10 01/04/2020   LDLCALC 58 01/04/2020   TRIG 151.0 (H) 01/04/2020   CHOLHDL 2 01/04/2020   Lab Results  Component Value Date   HGBA1C 7.0 (H) 01/04/2020   Lab Results  Component Value Date   [id number] (H) 08/12/2019   Lab Results  Component Value Date   TSH <0.01 Repeated and verified X2. (L) 01/04/2020      ASSESSMENT AND PLAN 54 y.o. year old female  has a past medical history of ACUT MI ANTEROLAT WALL SUBSQT EPIS CARE, Acute maxillary sinusitis, ALLERGIC RHINITIS, SEASONAL, ARTHRITIS, RHEUMATOID, Atrial fibrillation (HCC), Back pain, Bruxism (teeth grinding), CAD, ARTERY BYPASS GRAFT, CARPAL TUNNEL SYNDROME, BILATERAL, CHOLELITHIASIS, Contrast media allergy, DERMATITIS, ALLERGIC, DIABETES MELLITUS, TYPE I, DIABETIC  RETINOPATHY, Hiatal hernia, HYPERLIPIDEMIA-MIXED, HYPERTHYROIDISM, Insulin pump in place, MIGRAINE W/O AURA W/INTRACT W/STATUS MIGRAINOSUS (02/19/2008), Psoriasis, SINUS TACHYCARDIA (11/08/2010), SVT (supraventricular tachycardia) ([place]), TRIGGER FINGER, and URI. here with:  1.  Chronic migraine headache 2.  Snoring, daytime fatigue -Reports on average 5 headaches a week, mild to moderate, dull ache -Has previously tried [employer] (had hives, local reaction to injection site), Topamax, Lamictal, Trokendi, amitriptyline, is on metoprolol, had high blood pressure with Effexor XR-stopped, is no longer able to take NSAIDs due to colitis, d/c Cambia, not a candidate for triptans due to HTN, CAD -History significant for HTN, CAD, DM, consideration  of possible OSA?, BMI 29.9, report of snoring, daytime fatigue -Will send her for sleep study evaluation, possible OSA contributing to headaches? Will not add on any additional medications at this time -For headache, we could possibly consider Ajovy, will hold off on Aimovig (new concerns to worsen pre-existing HTN), allergic reaction previously to Emgality at injection site (while patient was on prednisone), possibly even Botox in the future -Follow-up in 4 months, possibly sooner depending on results of sleep study   I spent 30 minutes of face-to-face and non-face-to-face time with patient.  This included previsit chart review, lab review, study review, order entry, electronic health record documentation, patient education.  Judson Roch  Dian Situ, DNP 05/12/2020, 2:49 PM [facility] [address] [place], [place] [address] [phone]

## 2020-05-16 ENCOUNTER — Ambulatory Visit: Payer: 59 | Admitting: Physician Assistant

## 2020-05-16 ENCOUNTER — Telehealth: Payer: Self-pay | Admitting: Pharmacist

## 2020-05-16 NOTE — Telephone Encounter (Signed)
Received fax from [facility].  Patient received Simponi Aria infusion 150 mg on 05/12/20 without issue.  CBC/CMP drawn with infusion and results pending.  Last CBC/BMP within normal limits except for elevated glucose on 02/15/20.  Last TB gold negative on 10/07/19.  Next infusion scheduled for 06/09/2020.   Mariella Saa, PharmD, [place], [place] Clinical Specialty Pharmacist [phone]  05/16/2020 8:54 AM

## 2020-05-23 ENCOUNTER — Other Ambulatory Visit: Payer: Self-pay

## 2020-05-23 ENCOUNTER — Encounter: Payer: Self-pay | Admitting: Physician Assistant

## 2020-05-23 ENCOUNTER — Other Ambulatory Visit (INDEPENDENT_AMBULATORY_CARE_PROVIDER_SITE_OTHER): Payer: 59

## 2020-05-23 ENCOUNTER — Ambulatory Visit: Payer: 59 | Admitting: Physician Assistant

## 2020-05-23 ENCOUNTER — Telehealth: Payer: Self-pay | Admitting: Pharmacist

## 2020-05-23 VITALS — BP 105/61 | HR 61 | Resp 16 | Ht 63.0 in | Wt 170.2 lb

## 2020-05-23 DIAGNOSIS — E0500 Thyrotoxicosis with diffuse goiter without thyrotoxic crisis or storm: Secondary | ICD-10-CM | POA: Diagnosis not present

## 2020-05-23 DIAGNOSIS — Z8719 Personal history of other diseases of the digestive system: Secondary | ICD-10-CM

## 2020-05-23 DIAGNOSIS — G4709 Other insomnia: Secondary | ICD-10-CM

## 2020-05-23 DIAGNOSIS — M51369 Other intervertebral disc degeneration, lumbar region without mention of lumbar back pain or lower extremity pain: Secondary | ICD-10-CM

## 2020-05-23 DIAGNOSIS — M0609 Rheumatoid arthritis without rheumatoid factor, multiple sites: Secondary | ICD-10-CM

## 2020-05-23 DIAGNOSIS — M5136 Other intervertebral disc degeneration, lumbar region: Secondary | ICD-10-CM | POA: Diagnosis not present

## 2020-05-23 DIAGNOSIS — M5134 Other intervertebral disc degeneration, thoracic region: Secondary | ICD-10-CM | POA: Diagnosis not present

## 2020-05-23 DIAGNOSIS — R778 Other specified abnormalities of plasma proteins: Secondary | ICD-10-CM

## 2020-05-23 DIAGNOSIS — M24521 Contracture, right elbow: Secondary | ICD-10-CM | POA: Diagnosis not present

## 2020-05-23 DIAGNOSIS — M8589 Other specified disorders of bone density and structure, multiple sites: Secondary | ICD-10-CM | POA: Diagnosis not present

## 2020-05-23 DIAGNOSIS — E103599 Type 1 diabetes mellitus with proliferative diabetic retinopathy without macular edema, unspecified eye: Secondary | ICD-10-CM

## 2020-05-23 DIAGNOSIS — Z79899 Other long term (current) drug therapy: Secondary | ICD-10-CM

## 2020-05-23 DIAGNOSIS — L409 Psoriasis, unspecified: Secondary | ICD-10-CM

## 2020-05-23 DIAGNOSIS — R5383 Other fatigue: Secondary | ICD-10-CM

## 2020-05-23 DIAGNOSIS — Z7902 Long term (current) use of antithrombotics/antiplatelets: Secondary | ICD-10-CM

## 2020-05-23 DIAGNOSIS — Z8679 Personal history of other diseases of the circulatory system: Secondary | ICD-10-CM

## 2020-05-23 DIAGNOSIS — I10 Essential (primary) hypertension: Secondary | ICD-10-CM

## 2020-05-23 DIAGNOSIS — M24522 Contracture, left elbow: Secondary | ICD-10-CM

## 2020-05-23 DIAGNOSIS — E559 Vitamin D deficiency, unspecified: Secondary | ICD-10-CM

## 2020-05-23 DIAGNOSIS — Z8669 Personal history of other diseases of the nervous system and sense organs: Secondary | ICD-10-CM

## 2020-05-23 DIAGNOSIS — Z8639 Personal history of other endocrine, nutritional and metabolic disease: Secondary | ICD-10-CM

## 2020-05-23 LAB — LIPID PANEL
Cholesterol: 144 mg/dL (ref 0–200)
HDL: 57.7 mg/dL (ref >39.00)
LDL Cholesterol: 61 mg/dL (ref 0–99)
NonHDL: 86.09
Total CHOL/HDL Ratio: 2
Triglycerides: 123 mg/dL (ref 0.0–149.0)
VLDL: 24.6 mg/dL (ref 0.0–40.0)

## 2020-05-23 LAB — COMPREHENSIVE METABOLIC PANEL
ALT: 12 U/L (ref 0–35)
AST: 17 U/L (ref 0–37)
Albumin: 4.3 g/dL (ref 3.5–5.2)
Alkaline Phosphatase: 95 U/L (ref 39–117)
BUN: 14 mg/dL (ref 6–23)
CO2: 27 mEq/L (ref 19–32)
Calcium: 9 mg/dL (ref 8.4–10.5)
Chloride: 103 mEq/L (ref 96–112)
Creatinine, Ser: 0.81 mg/dL (ref 0.40–1.20)
GFR: 74.09 mL/min (ref >60.00)
Glucose, Bld: 174 mg/dL — ABNORMAL HIGH (ref 70–99)
Potassium: 3.8 mEq/L (ref 3.5–5.1)
Sodium: 141 mEq/L (ref 135–145)
Total Bilirubin: 0.4 mg/dL (ref 0.2–1.2)
Total Protein: 6.1 g/dL (ref 6.0–8.3)

## 2020-05-23 LAB — VITAMIN D 25 HYDROXY (VIT D DEFICIENCY, FRACTURES): VITD: 60.98 ng/mL (ref 30.00–100.00)

## 2020-05-23 LAB — CBC WITH DIFFERENTIAL/PLATELET
Basophils Absolute: 0.1 10*3/uL (ref 0.0–0.1)
Basophils Relative: 1.7 % (ref 0.0–3.0)
Eosinophils Absolute: 0.2 10*3/uL (ref 0.0–0.7)
Eosinophils Relative: 4.9 % (ref 0.0–5.0)
HCT: 36.1 % (ref 36.0–46.0)
Hemoglobin: 12.4 g/dL (ref 12.0–15.0)
Lymphocytes Relative: 38.8 % (ref 12.0–46.0)
Lymphs Abs: 1.6 10*3/uL (ref 0.7–4.0)
MCHC: 34.4 g/dL (ref 30.0–36.0)
MCV: 92.8 fl (ref 78.0–100.0)
Monocytes Absolute: 0.5 10*3/uL (ref 0.1–1.0)
Monocytes Relative: 11.6 % (ref 3.0–12.0)
Neutro Abs: 1.8 10*3/uL (ref 1.4–7.7)
Neutrophils Relative %: 43 % (ref 43.0–77.0)
Platelets: 157 10*3/uL (ref 150.0–400.0)
RBC: 3.89 Mil/uL (ref 3.87–5.11)
RDW: 15.4 % (ref 11.5–15.5)
WBC: 4.2 10*3/uL (ref 4.0–10.5)

## 2020-05-23 LAB — HEMOGLOBIN A1C: Hgb A1c MFr Bld: 6.1 % (ref 4.6–6.5)

## 2020-05-23 LAB — T4, FREE: Free T4: 1.07 ng/dL (ref 0.60–1.60)

## 2020-05-23 LAB — T3, FREE: T3, Free: 3.2 pg/mL (ref 2.3–4.2)

## 2020-05-23 LAB — TSH: TSH: 0.77 u[IU]/mL (ref 0.35–4.50)

## 2020-05-23 MED ORDER — PREDNISONE 10 MG PO TABS
10.0000 mg | ORAL_TABLET | Freq: Every day | ORAL | 0 refills | Status: DC
Start: 2020-05-23 — End: 2020-07-14

## 2020-05-23 NOTE — Telephone Encounter (Signed)
Submitted a Prior Authorization request to [facility] for [facility] via Cover My Meds. Will update once we receive a response.  (Key: [id number])

## 2020-05-23 NOTE — Progress Notes (Signed)
Pharmacy Note  Subjective: Patient presents today to [facility] for follow up office visit. Patient seen by the pharmacist for counseling on Rinvoq for rheumatoid arthritis..    Objective:  CMP     Component Value Date/Time   NA 140 02/15/2020 1656   K 4.4 02/15/2020 1656   CL 103 02/15/2020 1656   CO2 21 02/15/2020 1656   GLUCOSE 224 (H) 02/15/2020 1656   GLUCOSE 174 (H) 11/17/2019 1404   BUN 9 02/15/2020 1656   CREATININE 0.80 02/15/2020 1656   CREATININE 0.98 11/17/2019 1404   CALCIUM 9.3 02/15/2020 1656   PROT 5.8 (L) 11/17/2019 1404   ALBUMIN 3.6 10/29/2019 0918   AST 24 11/17/2019 1404   ALT 21 11/17/2019 1404   ALKPHOS 83 10/29/2019 0918    CBC    Component Value Date/Time   WBC 4.9 02/15/2020 1656   WBC 3.8 11/17/2019 1404   RBC 4.47 02/15/2020 1656   RBC 4.50 11/17/2019 1404   HGB 13.4 02/15/2020 1656   HCT 38.9 02/15/2020 1656   PLT 172 02/15/2020 1656   MCV 87 02/15/2020 1656   MCH 30.0 02/15/2020 1656   MCH 29.1 11/17/2019 1404   MCHC 34.4 02/15/2020 1656   MCHC 34.6 11/17/2019 1404   RDW 12.8 02/15/2020 1656    Baseline Immunosuppressant Therapy Labs TB GOLD Quantiferon TB Gold Latest Ref Rng & Units 10/07/2019  Quantiferon TB Gold Plus NEGATIVE NEGATIVE   Hepatitis Panel Hepatitis Latest Ref Rng & Units 10/07/2019  Hep B Surface Ag NON-REACTI NON-REACTIVE  Hep B IgM NON-REACTI NON-REACTIVE  Hep C Ab NON-REACTI NON-REACTIVE  Hep C Ab NON-REACTI NON-REACTIVE  Hep A IgM NON-REACTI NON-REACTIVE   HIV Lab Results  Component Value Date   HIV NON-REACTIVE 10/07/2019   Immunoglobulins Immunoglobulin Electrophoresis Latest Ref Rng & Units 10/07/2019  IgA  47 - 310 mg/dL 161  IgG 600 - 1,640 mg/dL 520(L)  IgM 50 - 300 mg/dL 70   SPEP Serum Protein Electrophoresis Latest Ref Rng & Units 11/17/2019  Total Protein 6.1 - 8.1 g/dL 5.8(L)  Albumin 3.8 - 4.8 g/dL -  Alpha-1 0.2 - 0.3 g/dL -  Alpha-2 0.5 - 0.9 g/dL -  Beta Globulin 0.4  - 0.6 g/dL -  Beta 2 0.2 - 0.5 g/dL -  Gamma Globulin 0.8 - 1.7 g/dL -   G6PD Lab Results  Component Value Date   G6PDH 14.8 04/08/2020   TPMT No results found for: TPMT   Lipid Panel Lab Results  Component Value Date   CHOL 166 01/04/2020   HDL 78.10 01/04/2020   LDLCALC 58 01/04/2020   TRIG 151.0 (H) 01/04/2020   CHOLHDL 2 01/04/2020     Does patient have history of diverticulitis?  No  Assessment/Plan:  Counseled patient that Rinvoq is a JAK inhibitor indicated for Rheumatoid Arthritis.  Counseled patient on purpose, proper use, and adverse effects of Rinvoq.    Reviewed the most common adverse effects including infection, diarrhea, headaches.  Also reviewed rare adverse effects such as bowel injury and the need to contact us if they develop stomach pain during treatment. Counseled on the increase risk of venous thrombosis. Reviewed with patient that there is the possibility of an increased risk of malignancy but it is not well understood if this increased risk is due to the medication or the disease state.  Instructed patient that medication should be held for infection and prior to surgery.  Advised patient to avoid live vaccines. Recommend annual influenza,  Pneumovax 23, Prevnar 13, and Shingrix as indicated.    Reviewed the importance of routine lab monitoring including lipid panel.  Standing orders placed. Provided patient with medication education material and answered all questions.  Patient consented to Rinvoq.  Will upload into patient's chart.  Will apply through patient's insurance and update when we receive a response.  Patient dose will be 15 mg daily.  Prescription will be sent to pharmacy pending lab results and insurance approval.   Medication Samples have been provided to the patient.  Drug name: Rinvoq 15mg      Qty: 14 tabs   LOT: [id number]   Exp.Date: 08/27/2022  Dosing instructions: Take 1 tablet daily  The patient has been instructed regarding the correct  time, dose, and frequency of taking this medication, including desired effects and most common side effects.   Mariella Saa, PharmD, [place], CPP Clinical Specialty Pharmacist (Rheumatology and Pulmonology)  05/23/2020 12:00 PM

## 2020-05-23 NOTE — Patient Instructions (Signed)
Upadacitinib extended-release tablets What is this medicine? UPADACITINIB (ue PAD a SYE ti nib) is a medicine that works on the immune system. This medicine is used to treat rheumatoid arthritis. This medicine may be used for other purposes; ask your health care provider or pharmacist if you have questions. COMMON BRAND NAME(S): RINVOQ What should I tell my health care provider before I take this medicine? They need to know if you have any of these conditions:  cancer  diabetes  high cholesterol  HIV or AIDs  immune system problems  infection (especially a virus infection such as hepatitis B, chickenpox, cold sores, or herpes)  liver disease  low blood counts, like low white cell, platelets, or red cell counts  history of blood clots  lung or breathing disease, like asthma  organ transplant  stomach or intestine problems  tuberculosis, a positive skin test for tuberculosis, or have recently been in close contact with someone who has tuberculosis  an unusual or allergic reaction to upadacitinib, other medicines, foods, dyes or preservatives  pregnant or trying to get pregnant  breast-feeding How should I use this medicine? Take this medicine by mouth with a full glass of water. Follow the directions on the prescription label. Do not cut, crush, or chew this medicine. Swallow the tablets whole. You can take it with or without food. Take your medicine at regular intervals. Do not take more often then directed. Do not stop taking except on your doctor's advice. A special MedGuide will be given to you by the pharmacist with each prescription and refill. Be sure to read this information carefully each time. Talk to your pediatrician regarding the use of this medicine in children. Special care may be needed. Overdosage: If you think you have taken too much of this medicine contact a poison control center or emergency room at once. NOTE: This medicine is only for you. Do not share  this medicine with others. What if I miss a dose? If you miss a dose, take it as soon as you can. If it is almost time for your next dose, take only that dose. Do not take double or extra doses. What may interact with this medicine? Do not take this medicine with any of the following medications:  baricitinib  tofacitinib This medicine may also interact with the following medications:  azathioprine, cyclosporine, or other immunosuppressive drugs  biologic medicines such as abatacept, adalimumab, anakinra, certolizumab, etanercept, golimumab, infliximab, rituximab, secukinumab, tocilizumab, ustekinumab  certain medicines for fungal infections like ketoconazole, itraconazole, or posaconazole  certain medicines for seizures like carbamazepine, phenobarbital, phenytoin  clarithromycin  live vaccines  rifampin  supplements, such as St. John's wort This list may not describe all possible interactions. Give your health care provider a list of all the medicines, herbs, non-prescription drugs, or dietary supplements you use. Also tell them if you smoke, drink alcohol, or use illegal drugs. Some items may interact with your medicine. What should I watch for while using this medicine? Visit your healthcare professional for regular checks on your progress. Tell your healthcare professional if your symptoms do not start to get better or if they get worse. You may need blood work done while you are taking this medicine. Avoid taking products that contain aspirin, acetaminophen, ibuprofen, naproxen, or ketoprofen unless instructed by your doctor. These medicines may hide a fever. Call your doctor or healthcare professional for advice if you get a fever, chills or sore throat or other symptoms of a cold or flu. Do  not treat yourself. This drug decreases your body's ability to fight infections. Try to avoid being around people who are sick. Do not become pregnant while taking this medicine. Women  should inform their healthcare professional if they wish to become pregnant or think they might be pregnant. There is potential for serious side effects and harm to an unborn child. Talk to your healthcare professional for more information. Do not breast-feed an infant while taking this medicine or for 6 days after stopping it. What side effects may I notice from receiving this medicine? Side effects that you should report to your doctor or health care professional as soon as possible:  allergic reactions like skin rash, itching or hives, swelling of the face, lips, or tongue  breathing problems  signs and symptoms of a blood clot such as breathing problems; changes in vision; chest pain; severe, sudden headache; pain, swelling, warmth in the leg; trouble speaking; sudden numbness or weakness of the face, arm, or leg  sign and symptoms of infection like fever or chills; cough; sore throat; pain or trouble passing urine  signs and symptoms of liver injury like dark yellow or brown urine; general ill feeling or flu-like symptoms; light-colored stools; loss of appetite; nausea; right upper belly pain; unusually weak or tired; yellowing of the eyes or skin  stomach pain or a sudden change in bowel habits  unusually weak or tired Side effects that usually do not require medical attention (report these to your doctor or health care professional if they continue or are bothersome):  nausea  runny nose  sinus trouble This list may not describe all possible side effects. Call your doctor for medical advice about side effects. You may report side effects to FDA at 1-800-FDA-1088. Where should I keep my medicine? Keep out of the reach of children. Store between 2 and 25 degrees C (36 and 77 degrees F). Keep this medicine in the original container. Throw away any unused medicine after the expiration date. NOTE: This sheet is a summary. It may not cover all possible information. If you have questions  about this medicine, talk to your doctor, pharmacist, or health care provider.  2020 Elsevier/Gold Standard (2018-08-20 01:45:07)

## 2020-05-23 NOTE — Telephone Encounter (Signed)
Please start benefits investigation for Rinvoq for the treatment of rheumatoid arthritis. Prior Therapy: methotrexate/Xeljanz/Actemra (GI upset), Enbrel (injection site reaction) and Orencia/Remicade/Humira/Cimzia/Rituxan/Simponi/Simponi Aria (inadequate response).   Mariella Saa, PharmD, [place], CPP Clinical Specialty Pharmacist (Rheumatology and Pulmonology)  05/23/2020 2:03 PM

## 2020-05-23 NOTE — Addendum Note (Signed)
Addended by: Earnestine Mealing on: 05/23/2020 01:45 PM   Modules accepted: Orders

## 2020-05-25 ENCOUNTER — Ambulatory Visit (HOSPITAL_BASED_OUTPATIENT_CLINIC_OR_DEPARTMENT_OTHER): Payer: 59 | Admitting: Pharmacist

## 2020-05-25 ENCOUNTER — Other Ambulatory Visit: Payer: Self-pay

## 2020-05-25 DIAGNOSIS — Z79899 Other long term (current) drug therapy: Secondary | ICD-10-CM

## 2020-05-25 DIAGNOSIS — M0609 Rheumatoid arthritis without rheumatoid factor, multiple sites: Secondary | ICD-10-CM

## 2020-05-25 MED ORDER — RINVOQ 15 MG PO TB24: 15.0000 mg | ORAL_TABLET | Freq: Every day | ORAL | 0 refills | Status: DC

## 2020-05-25 NOTE — Telephone Encounter (Signed)
Received notification from [facility] regarding a prior authorization for [facility]. Authorization has been APPROVED from 05/24/2020 to 11/23/2020.   Authorization # [id number]  Left patient voicemail notifying of approval.  Mariella Saa, PharmD, BCACP, CPP Clinical Specialty Pharmacist (Rheumatology and Pulmonology)  05/25/2020 8:18 AM

## 2020-05-25 NOTE — Progress Notes (Signed)
  S: Patient presents today for review of their specialty medication.   Patient is currently taking Rinvoq for rheumatoid arthritis. Patient is managed by [facility] for this. Seen by [facility] 05/23/2020 for education. Samples provided at that appointment.   Dosing: Adult  Note: May be used as monotherapy or in combination with methotrexate or other nonbiologic disease-modifying antirheumatic drugs (DMARDs); use in combination with biologic DMARDS or potent immunosuppressants (eg, azathioprine, cyclosporine) is not recommended. Do not initiate therapy in patients with an absolute lymphocyte count <500/mm3, ANC <1,000/mm3, or hemoglobin <8 g/dL. Rheumatoid arthritis: Oral: 15 mg once daily.  Adherence: has not yet started   Efficacy:  Has not started   Monitoring: S/sx thromboembolism: none S/sx malignancy: none  S/sx of infection: none  Current adverse effects: none   O:     Lab Results  Component Value Date   WBC 4.2 05/23/2020   HGB 12.4 05/23/2020   HCT 36.1 05/23/2020   MCV 92.8 05/23/2020   PLT 157.0 05/23/2020      Chemistry      Component Value Date/Time   NA 141 05/23/2020 0847   NA 140 02/15/2020 1656   K 3.8 05/23/2020 0847   CL 103 05/23/2020 0847   CO2 27 05/23/2020 0847   BUN 14 05/23/2020 0847   BUN 9 02/15/2020 1656   CREATININE 0.81 05/23/2020 0847   CREATININE 0.98 11/17/2019 1404   GLU 119 07/16/2016 0000      Component Value Date/Time   CALCIUM 9.0 05/23/2020 0847   ALKPHOS 95 05/23/2020 0847   AST 17 05/23/2020 0847   ALT 12 05/23/2020 0847   BILITOT 0.4 05/23/2020 0847      Lab Results  Component Value Date   CHOL 144 05/23/2020   HDL 57.70 05/23/2020   LDLCALC 61 05/23/2020   TRIG 123.0 05/23/2020   CHOLHDL 2 05/23/2020    A/P: 1. Medication review: patient currently prescribed Rinvoq for rheumatoid arthritis. Reviewed the medication with the patient, including the following: Rinvoq is a medication used to treat rheumatoid arthritis.  Administer with or without food. Swallow tablet whole; do not crush, split, or chew. Possible adverse effects include increased risk of infection, GI upset, hematologic toxicity, hepatic effects, lipid abnormalities, increased risk of malignancy, thromboembolism. Avoid live vaccinations. No recommendations for any changes.  Benard Halsted, PharmD, [place] [phone]

## 2020-05-26 MED FILL — RINVOQ 15 MG TB24: 15 | 30 days supply | Qty: 30 | Fill #0

## 2020-06-06 ENCOUNTER — Telehealth: Payer: Self-pay | Admitting: Rheumatology

## 2020-06-06 NOTE — Telephone Encounter (Signed)
Tamekra from [facility] called stating patient cancelled her appointment on Thursday stating she has been switched to a different medication.  Tamekra requests a return call to let her know if they can discharge patient from their practice.  Please call [phone] Ext [phone]

## 2020-06-06 NOTE — Telephone Encounter (Signed)
Returned call and confirmed therapy switch. Patient will be discharged from [facility]. Nothing further needed.

## 2020-06-13 ENCOUNTER — Other Ambulatory Visit: Payer: Self-pay | Admitting: Physician Assistant

## 2020-06-13 NOTE — Telephone Encounter (Signed)
Last Visit: 05/23/2020  Next Visit: 07/06/2020  Last Fill: 05/09/2020  Okay to refill Flexeril?

## 2020-06-17 ENCOUNTER — Encounter: Payer: Self-pay | Admitting: Cardiovascular Disease

## 2020-06-17 ENCOUNTER — Ambulatory Visit: Payer: 59 | Admitting: Cardiovascular Disease

## 2020-06-17 ENCOUNTER — Other Ambulatory Visit: Payer: Self-pay

## 2020-06-17 VITALS — BP 116/68 | HR 53 | Ht 63.0 in | Wt 172.4 lb

## 2020-06-17 DIAGNOSIS — I25119 Atherosclerotic heart disease of native coronary artery with unspecified angina pectoris: Secondary | ICD-10-CM | POA: Diagnosis not present

## 2020-06-17 DIAGNOSIS — E782 Mixed hyperlipidemia: Secondary | ICD-10-CM | POA: Diagnosis not present

## 2020-06-17 DIAGNOSIS — I10 Essential (primary) hypertension: Secondary | ICD-10-CM | POA: Diagnosis not present

## 2020-06-17 NOTE — Progress Notes (Signed)
Cardiology Office Note:    Date:  06/17/2020   ID:  [name], [name] [birth year], MRN [id number]  PCP:  McLean-Scocuzza, Nino Glow, MD  [facility] Cardiologist:  Sherren Mocha, MD  [place] Electrophysiologist:  None   Referring MD: McLean-Scocuzza, Olivia Mackie *   Chief Complaint  Patient presents with  . Coronary Artery Disease    History of Present Illness:    [name] is a 54 y.o. female with a hx of coronary artery disease, initially presenting with an inferior wall MI treated with PCI of the right coronary artery.  She ultimately was treated with multivessel CABG in 2010 in the setting of a chronically occluded LAD.  Comorbid conditions include diabetes, hypertension, hyperlipidemia, gastroesophageal reflux disease.  She has also had Graves' disease and rheumatoid arthritis.  The patient is here alone today.  She is doing well.  She underwent cardiac catheterization in March which demonstrated stable coronary anatomy with continued patency of the LIMA to LAD graft and saphenous vein graft sequential to the RCA branches.  The vein graft to the diagonal is chronically occluded.  There is no significant obstruction in the left main or left circumflex.  Medical management was recommended.  She has occasional chest discomfort but does not notice typical pattern associated with physical activity.  Her heart rate has been increased in the late afternoon to early evening.  She notes that she feels her heart racing at times.  She has been taking her second dose of metoprolol at bedtime and she takes her first dose at about 6:00 in the morning before work.  She has had some leg edema but this is resolved with the use of daily Lasix and potassium supplementation.  She denies orthopnea or PND.  She has no shortness of breath.  Past Medical History:  Diagnosis Date  . ACUT MI ANTEROLAT WALL SUBSQT EPIS CARE   . Acute maxillary sinusitis   . ALLERGIC RHINITIS, SEASONAL   . ARTHRITIS,  RHEUMATOID    shoulders and hands Enbrel>leg swelling Dr. Estanislado Pandy  . Atrial fibrillation ([place])    a. after CABG.  . Back pain   . Bruxism (teeth grinding)   . CAD, ARTERY BYPASS GRAFT    a. DES to RCA in 2010 then LAD occlusion s/p CABG 3 06/07/2009 with LIMA to LAD, reverse SVG to D1, reverse SVG to distal RCA. b. Cath 05/08/2016 slightly hypodense region in the intermediate branch, however she had excellent flow, FFR was normal. Vein graft to PDA and the posterolateral branch is patent, patent LIMA to LAD, occluded SVG to diagonal.  . CARPAL TUNNEL SYNDROME, BILATERAL   . CHOLELITHIASIS   . Contrast media allergy   . DERMATITIS, ALLERGIC   . DIABETES MELLITUS, TYPE I    on insulin pump dx'ed age 83 y.o   . DIABETIC  RETINOPATHY   . Hiatal hernia   . HYPERLIPIDEMIA-MIXED   . HYPERTHYROIDISM    Dr. Dollene Cleveland  . Insulin pump in place   . MIGRAINE W/O AURA W/INTRACT W/STATUS MIGRAINOSUS 02/19/2008  . Psoriasis   . SINUS TACHYCARDIA 11/08/2010  . SVT (supraventricular tachycardia) (HCC)    after s/p CABG  . TRIGGER FINGER    all fingers b/l hands   . URI     Past Surgical History:  Procedure Laterality Date  . ABDOMINAL HYSTERECTOMY     endometriomas b/l; total ? cervix removed; no h/o abnormal pap  . Caesarean section    . CARDIAC CATHETERIZATION  N/A 05/08/2016   Procedure: Left Heart Cath and Cors/Grafts Angiography;  Surgeon: Burnell Blanks, MD;  Location: [place] [facility];  Service: Cardiovascular;  Laterality: N/A;  . CARPAL TUNNEL RELEASE     b/l   . CATARACT EXTRACTION, BILATERAL    . CHOLECYSTECTOMY    . COLONOSCOPY WITH PROPOFOL N/A 03/25/2020   Procedure: COLONOSCOPY WITH PROPOFOL;  Surgeon: Jonathon Bellows, MD;  Location: [facility] ENDOSCOPY;  Service: Gastroenterology;  Laterality: N/A;  . CORONARY ARTERY BYPASS GRAFT    . ESOPHAGOGASTRODUODENOSCOPY (EGD) WITH PROPOFOL N/A 03/25/2020   Procedure: ESOPHAGOGASTRODUODENOSCOPY (EGD) WITH PROPOFOL;  Surgeon: Jonathon Bellows, MD;  Location: [facility] ENDOSCOPY;  Service: Gastroenterology;  Laterality: N/A;  . EYE SURGERY     laser x 2 for retinopathy   . LEFT HEART CATH AND CORS/GRAFTS ANGIOGRAPHY N/A 02/22/2020   Procedure: LEFT HEART CATH AND CORS/GRAFTS ANGIOGRAPHY;  Surgeon: Burnell Blanks, MD;  Location: [place] [facility];  Service: Cardiovascular;  Laterality: N/A;  . TRIGGER FINGER RELEASE     b/l fingers all   . VITRECTOMY     b/l     Current Medications: Current Meds  Medication Sig  . amLODipine (NORVASC) 5 MG tablet Take 0.5 tablets (2.5 mg total) by mouth daily.  Marland Kitchen aspirin EC 81 MG tablet Take 81 mg by mouth daily.  . CHELATED MAGNESIUM PO Take 250 mg by mouth daily.  . Cholecalciferol (VITAMIN D) 125 MCG (5000 UT) CAPS Take 5,000 Units by mouth daily.  . clopidogrel (PLAVIX) 75 MG tablet TAKE 1 TABLET BY MOUTH ONCE DAILY  . cyclobenzaprine (FLEXERIL) 10 MG tablet TAKE 1 TABLET BY MOUTH AT BEDTIME  . EPINEPHrine 0.3 mg/0.3 mL IJ SOAJ injection Inject 0.3 mLs (0.3 mg total) into the muscle as needed for anaphylaxis.  . folic acid (FOLVITE) 1 MG tablet .AKE 2 TABLETS (2 MG TOTAL) BY MOUTH DAILY.  . furosemide (LASIX) 40 MG tablet Take 1 tablet (40 mg total) by mouth daily as needed for edema.  . hyoscyamine (LEVSIN) 0.125 MG tablet Take 1 tablet (0.125 mg total) by mouth 3 (three) times daily as needed.  . insulin lispro (HUMALOG) 100 UNIT/ML injection Inject 80-90 Units into the skin continuous. FOR USE IN INSULIN PUMP. TOTAL DAILY INSULIN DOSE = UP TO 90 UNITS.  Marland Kitchen isosorbide mononitrate (IMDUR) 60 MG 24 hr tablet TAKE 1 TABLET (60 MG TOTAL) BY MOUTH DAILY.  Marland Kitchen Krill Oil 500 MG CAPS Take 500 mg by mouth daily.  Marland Kitchen leflunomide (ARAVA) 20 MG tablet TAKE 1 TABLET BY MOUTH DAILY.  Marland Kitchen linaclotide (LINZESS) 290 MCG CAPS capsule Take 1 capsule (290 mcg total) by mouth daily before breakfast.  . losartan (COZAAR) 100 MG tablet Take 1 tablet (100 mg total) by mouth daily.  . metoprolol tartrate  (LOPRESSOR) 50 MG tablet TAKE 1 TABLET BY MOUTH TWICE DAILY  . montelukast (SINGULAIR) 10 MG tablet Take 1 tablet (10 mg total) by mouth at bedtime.  Marland Kitchen omeprazole (PRILOSEC) 40 MG capsule   . ONE TOUCH ULTRA TEST test strip   . pantoprazole (PROTONIX) 40 MG tablet Take 1 tablet (40 mg total) by mouth daily.  . potassium chloride (KLOR-CON) 10 MEQ tablet Take 1 tablet (10 mEq total) by mouth daily.  . predniSONE (DELTASONE) 10 MG tablet Take 1 tablet (10 mg total) by mouth daily with breakfast.  . promethazine (PHENERGAN) 25 MG tablet Take 1 tablet (25 mg total) by mouth every 6 (six) hours as needed for nausea or  vomiting.  . rosuvastatin (CRESTOR) 20 MG tablet Take 1 tablet (20 mg total) by mouth daily.  Marland Kitchen Upadacitinib ER (RINVOQ) 15 MG TB24 Take 15 mg by mouth daily.     Allergies:   Actemra [tocilizumab], Prochlorperazine, Ramipril, Shellfish-derived products, Atorvastatin, Compazine  [prochlorperazine edisylate], Emgality [galcanezumab-gnlm], Etanercept, Infliximab, Iohexol, Orencia [abatacept], Prochlorperazine edisylate, Shellfish allergy, Tofacitinib, Trokendi xr [topiramate er], Amiodarone, and Rituximab   Social History   Socioeconomic History  . Marital status: Divorced    Spouse name: Not on file  . Number of children: Not on file  . Years of education: Not on file  . Highest education level: Not on file  Occupational History  . Not on file  Tobacco Use  . Smoking status: Never Smoker  . Smokeless tobacco: Never Used  Vaping Use  . Vaping Use: Never used  Substance and Sexual Activity  . Alcohol use: Yes    Comment: rarely 1 every 6 months  . Drug use: No  . Sexual activity: Not Currently    Partners: Male    Birth control/protection: None  Other Topics Concern  . Not on file  Social History Narrative   Divorced. Has 2 kids(fraternal twins) daughters age 8. Works at [employer], Never smoked, denies ETOH, no drugs. Drinks diet coke. No exercise.        DPR daughters [name] and Aradia Estey twins          Social Determinants of Health   Financial Resource Strain:   . Difficulty of Paying Living Expenses:   Food Insecurity:   . Worried About Charity fundraiser in the Last Year:   . Arboriculturist in the Last Year:   Transportation Needs:   . Film/video editor (Medical):   Marland Kitchen Lack of Transportation (Non-Medical):   Physical Activity:   . Days of Exercise per Week:   . Minutes of Exercise per Session:   Stress:   . Feeling of Stress :   Social Connections:   . Frequency of Communication with Friends and Family:   . Frequency of Social Gatherings with Friends and Family:   . Attends Religious Services:   . Active Member of Clubs or Organizations:   . Attends Archivist Meetings:   Marland Kitchen Marital Status:      Family History: The patient's family history includes Anxiety disorder in her mother; Colon polyps in her father; Depression in her mother and another family member; Diabetes in her father and another family member; Heart disease in an other family member; Hyperlipidemia in an other family member; Hypertension in her father and another family member; Migraines in an other family member; Parkinson's disease in her father; Stroke in her paternal grandmother. There is no history of Heart attack, Colon cancer, Stomach cancer, or Esophageal cancer.  ROS:   Please see the history of present illness.    All other systems reviewed and are negative.  EKGs/Labs/Other Studies Reviewed:    The following studies were reviewed today: Cardiac catheterization 02/22/2020: Conclusion    Ramus lesion is 40% stenosed.  Prox LAD lesion is 100% stenosed.  SVG graft was not visualized due to known occlusion.  Origin lesion is 100% stenosed.  SVG graft was visualized by angiography and is normal in caliber.  Prox RCA to Mid RCA lesion is 99% stenosed.  LIMA graft was visualized by angiography and is normal in  caliber.  The left ventricular systolic function is normal.  LV end diastolic pressure  is normal.  The left ventricular ejection fraction is 55-65% by visual estimate.  There is no mitral valve regurgitation.   1. Severe double vessel CAD s/p 3V CABG 2. The LAD is occluded proximally. The mid and distal LAD and diagonal branch fill from the patent LIMA graft. The vein graft to the diagonal branch is known to be occluded and was not selectively engaged today.   3. The Circumflex is small in caliber and has mild plaque disease. The intermediate branch is a moderate caliber vessel with mild proximal stenosis.   4. The RCA is previously stented in the mid segment. The mid stented segment has diffuse 99% stenosis. The vein graft to the distal branches is patent.  5. Normal LV systolic function  Recommendations: Continue medical management of CAD   Coronary Diagrams  Diagnostic Dominance: Right   EKG:  EKG is not ordered today.    Recent Labs: 05/23/2020: ALT 12; BUN 14; Creatinine, Ser 0.81; Hemoglobin 12.4; Platelets 157.0; Potassium 3.8; Sodium 141; TSH 0.77  Recent Lipid Panel    Component Value Date/Time   CHOL 144 05/23/2020 0847   TRIG 123.0 05/23/2020 0847   HDL 57.70 05/23/2020 0847   CHOLHDL 2 05/23/2020 0847   VLDL 24.6 05/23/2020 0847   LDLCALC 61 05/23/2020 0847    Physical Exam:    VS:  BP 116/68 (BP Location: Right Arm, Patient Position: Sitting, Cuff Size: Normal)   Pulse (!) 53   Ht 5\' 3"  (1.6 m)   Wt 172 lb 6.4 oz (78.2 kg)   SpO2 99%   BMI 30.54 kg/m     Wt Readings from Last 3 Encounters:  06/17/20 172 lb 6.4 oz (78.2 kg)  05/23/20 170 lb 3.2 oz (77.2 kg)  05/12/20 169 lb (76.7 kg)     GEN:  Well nourished, well developed in no acute distress HEENT: Normal NECK: No JVD; No carotid bruits LYMPHATICS: No lymphadenopathy CARDIAC: RRR, no murmurs, rubs, gallops RESPIRATORY:  Clear to auscultation without rales, wheezing or rhonchi  ABDOMEN: Soft,  non-tender, non-distended MUSCULOSKELETAL:  No edema; No deformity  SKIN: Warm and dry NEUROLOGIC:  Alert and oriented x 3 PSYCHIATRIC:  Normal affect   ASSESSMENT:    1. Coronary artery disease involving native coronary artery of native heart with angina pectoris ([place])   2. Essential hypertension   3. Mixed hyperlipidemia    PLAN:    In order of problems listed above:  1. The patient is stable on her current medical program.  This includes aspirin and clopidogrel.  I would favor ongoing dual antiplatelet therapy considering her young age, presence of diabetes, 11 years out from CABG with one vein graft occluded.  If she needs to interrupt clopidogrel for surgical procedure this would be acceptable but overall I think she will benefit from long-term dual antiplatelet therapy.  She will continue on isosorbide and metoprolol for antianginal therapy. 2. Well-controlled on current medicines. 3. Lipids are excellent with recent lab values showing a cholesterol of 144, HDL 58, LDL 61.  She is treated with rosuvastatin 20 mg daily. 4.  5. Overall I think she is clinically stable and I will see her back in 1 year for follow-up evaluation.   Medication Adjustments/Labs and Tests Ordered: Current medicines are reviewed at length with the patient today.  Concerns regarding medicines are outlined above.  No orders of the defined types were placed in this encounter.  No orders of the defined types were placed in this encounter.  Patient Instructions  Medication Instructions:  Your provider recommends that you continue on your current medications as directed. Please refer to the Current Medication list given to you today.   *If you need a refill on your cardiac medications before your next appointment, please call your pharmacy*   Follow-Up: At [facility], you and your health needs are our priority.  As part of our continuing mission to provide you with exceptional heart care, we have  created designated Provider Care Teams.  These Care Teams include your primary Cardiologist (physician) and Advanced Practice Providers (APPs -  Physician Assistants and Nurse Practitioners) who all work together to provide you with the care you need, when you need it. Your next appointment:   12 month(s) The format for your next appointment:   In Person Provider:   You may see Sherren Mocha, MD or one of the following Advanced Practice Providers on your designated Care Team:    Richardson Dopp, PA-C  Robbie Lis, [place]      Signed, Sherren Mocha, MD  06/17/2020 9:29 AM    [place]

## 2020-06-17 NOTE — Patient Instructions (Signed)

## 2020-06-27 MED FILL — RINVOQ 15 MG TB24: 15 | 30 days supply | Qty: 30 | Fill #1

## 2020-06-29 NOTE — Progress Notes (Deleted)
Office Visit Note  Patient: [name]             Date of Birth: [birth year]           MRN: [id number]             PCP: McLean-Scocuzza, Nino Glow, MD Referring: Orland Mustard * Visit Date: 07/13/2020 Occupation: @GUAROCC @  Subjective:  No chief complaint on file.   History of Present Illness: [name] is a 54 y.o. female ***   Activities of Daily Living:  Patient reports morning stiffness for *** {minute/hour:19697}.   Patient {ACTIONS;DENIES/REPORTS:21021675::"Denies"} nocturnal pain.  Difficulty dressing/grooming: {ACTIONS;DENIES/REPORTS:21021675::"Denies"} Difficulty climbing stairs: {ACTIONS;DENIES/REPORTS:21021675::"Denies"} Difficulty getting out of chair: {ACTIONS;DENIES/REPORTS:21021675::"Denies"} Difficulty using hands for taps, buttons, cutlery, and/or writing: {ACTIONS;DENIES/REPORTS:21021675::"Denies"}  No Rheumatology ROS completed.   PMFS History:  Patient Active Problem List   Diagnosis Date Noted  . Snoring 05/12/2020  . Lung nodule 03/10/2020  . Colitis 03/10/2020  . Type 1 diabetes mellitus with proliferative retinopathy ([place]) 12/10/2019  . Abnormal thyroid function test 12/10/2019  . Bruxism (teeth grinding)   . Chronic migraine 10/07/2019  . Contusion of left hip 07/25/2019  . H/O multiple allergies 03/10/2019  . Hx of anaphylaxis 03/10/2019  . Cough 06/26/2018  . PND (post-nasal drip) 06/26/2018  . Vitamin D deficiency 07/04/2017  . B12 deficiency 07/04/2017  . Abnormal CT scan 06/19/2017  . Gastroesophageal reflux disease 06/19/2017  . Antiplatelet or antithrombotic long-term use 06/19/2017  . Nasal congestion 03/15/2017  . Graves disease 02/01/2017  . Sleep difficulties 02/01/2017  . No energy 02/01/2017  . Osteopenia 02/01/2017  . Hypertension, essential 05/08/2016  . IDDM (insulin dependent diabetes mellitus) 05/08/2016  . Coronary artery disease involving native coronary artery of native heart with angina pectoris ([place])    . Hyperthyroidism 11/09/2010  . SINUS TACHYCARDIA 11/08/2010  . DERMATITIS, ALLERGIC 07/20/2010  . EDEMA 07/12/2010  . DIZZINESS 11/14/2009  . ATRIAL FIBRILLATION 07/20/2009  . CAD, ARTERY BYPASS GRAFT 06/07/2009  . ANGINA, STABLE/EXERTIONAL 06/02/2009  . HYPERLIPIDEMIA-MIXED 04/26/2009  . ACUT MI ANTEROLAT WALL SUBSQT EPIS CARE 04/26/2009  . Chest pain 04/26/2009  . DIABETIC  RETINOPATHY 04/25/2009  . CARPAL TUNNEL SYNDROME, BILATERAL 04/25/2009  . TRIGGER FINGER 04/25/2009  . MIGRAINE W/O AURA W/INTRACT W/STATUS MIGRAINOSUS 02/19/2008  . ALLERGIC RHINITIS, SEASONAL 10/16/2006  . CHOLELITHIASIS 10/16/2006  . Rheumatoid arthritis ([place]) 10/16/2006    Past Medical History:  Diagnosis Date  . ACUT MI ANTEROLAT WALL SUBSQT EPIS CARE   . Acute maxillary sinusitis   . ALLERGIC RHINITIS, SEASONAL   . ARTHRITIS, RHEUMATOID    shoulders and hands Enbrel>leg swelling Dr. Estanislado Pandy  . Atrial fibrillation ([place])    a. after CABG.  . Back pain   . Bruxism (teeth grinding)   . CAD, ARTERY BYPASS GRAFT    a. DES to RCA in 2010 then LAD occlusion s/p CABG 3 06/07/2009 with LIMA to LAD, reverse SVG to D1, reverse SVG to distal RCA. b. Cath 05/08/2016 slightly hypodense region in the intermediate branch, however she had excellent flow, FFR was normal. Vein graft to PDA and the posterolateral branch is patent, patent LIMA to LAD, occluded SVG to diagonal.  . CARPAL TUNNEL SYNDROME, BILATERAL   . CHOLELITHIASIS   . Contrast media allergy   . DERMATITIS, ALLERGIC   . DIABETES MELLITUS, TYPE I    on insulin pump dx'ed age 5 y.o   . DIABETIC  RETINOPATHY   . Hiatal hernia   . HYPERLIPIDEMIA-MIXED   .  HYPERTHYROIDISM    Dr. Dollene Cleveland  . Insulin pump in place   . MIGRAINE W/O AURA W/INTRACT W/STATUS MIGRAINOSUS 02/19/2008  . Psoriasis   . SINUS TACHYCARDIA 11/08/2010  . SVT (supraventricular tachycardia) (HCC)    after s/p CABG  . TRIGGER FINGER    all fingers b/l hands   . URI       Family History  Problem Relation Age of Onset  . Depression Mother   . Anxiety disorder Mother   . Colon polyps Father   . Diabetes Father        2  . Hypertension Father   . Parkinson's disease Father   . Diabetes Other   . Heart disease Other   . Hypertension Other   . Hyperlipidemia Other   . Depression Other   . Migraines Other   . Stroke Paternal Grandmother   . Heart attack Neg Hx   . Colon cancer Neg Hx   . Stomach cancer Neg Hx   . Esophageal cancer Neg Hx    Past Surgical History:  Procedure Laterality Date  . ABDOMINAL HYSTERECTOMY     endometriomas b/l; total ? cervix removed; no h/o abnormal pap  . Caesarean section    . CARDIAC CATHETERIZATION N/A 05/08/2016   Procedure: Left Heart Cath and Cors/Grafts Angiography;  Surgeon: Burnell Blanks, MD;  Location: [place] [facility] LAB;  Service: Cardiovascular;  Laterality: N/A;  . CARPAL TUNNEL RELEASE     b/l   . CATARACT EXTRACTION, BILATERAL    . CHOLECYSTECTOMY    . COLONOSCOPY WITH PROPOFOL N/A 03/25/2020   Procedure: COLONOSCOPY WITH PROPOFOL;  Surgeon: Jonathon Bellows, MD;  Location: [facility] ENDOSCOPY;  Service: Gastroenterology;  Laterality: N/A;  . CORONARY ARTERY BYPASS GRAFT    . ESOPHAGOGASTRODUODENOSCOPY (EGD) WITH PROPOFOL N/A 03/25/2020   Procedure: ESOPHAGOGASTRODUODENOSCOPY (EGD) WITH PROPOFOL;  Surgeon: Jonathon Bellows, MD;  Location: [facility] ENDOSCOPY;  Service: Gastroenterology;  Laterality: N/A;  . EYE SURGERY     laser x 2 for retinopathy   . LEFT HEART CATH AND CORS/GRAFTS ANGIOGRAPHY N/A 02/22/2020   Procedure: LEFT HEART CATH AND CORS/GRAFTS ANGIOGRAPHY;  Surgeon: Burnell Blanks, MD;  Location: [place] [facility] LAB;  Service: Cardiovascular;  Laterality: N/A;  . TRIGGER FINGER RELEASE     b/l fingers all   . VITRECTOMY     b/l    Social History   Social History Narrative   Divorced. Has 2 kids(fraternal twins) daughters age 56. Works at [employer], Never smoked, denies ETOH, no  drugs. Drinks diet coke. No exercise.       DPR daughters [name] and Madelein Mahadeo twins          Immunization History  Administered Date(s) Administered  . Influenza, Quadrivalent, Recombinant, Inj, Pf 10/14/2013  . Influenza, Seasonal, Injecte, Preservative Fre 09/19/2016  . Influenza,inj,Quad PF,6+ Mos 09/30/2014, 09/03/2018  . Influenza,inj,quad, With Preservative 09/23/2017  . Influenza-Unspecified 09/30/2014, 09/30/2016, 09/03/2018, 09/10/2019  . PFIZER SARS-COV-2 Vaccination 12/28/2019, 01/18/2020  . Pneumococcal Conjugate-13 07/16/2016  . Pneumococcal Polysaccharide-23 01/01/2004, 10/15/2006, 01/04/2020  . Td 10/01/2003  . Tdap 01/09/2011, 06/25/2018     Objective: Vital Signs: There were no vitals taken for this visit.   Physical Exam   Musculoskeletal Exam: ***  CDAI Exam: CDAI Score: -- Patient Global: --; Provider Global: -- Swollen: --; Tender: -- Joint Exam 07/13/2020   No joint exam has been documented for this visit   There is currently no information documented on the homunculus. Go  to the Rheumatology activity and complete the homunculus joint exam.  Investigation: No additional findings.  Imaging: No results found.  Recent Labs: Lab Results  Component Value Date   WBC 4.2 05/23/2020   HGB 12.4 05/23/2020   PLT 157.0 05/23/2020   NA 141 05/23/2020   K 3.8 05/23/2020   CL 103 05/23/2020   CO2 27 05/23/2020   GLUCOSE 174 (H) 05/23/2020   BUN 14 05/23/2020   CREATININE 0.81 05/23/2020   BILITOT 0.4 05/23/2020   ALKPHOS 95 05/23/2020   AST 17 05/23/2020   ALT 12 05/23/2020   PROT 6.1 05/23/2020   ALBUMIN 4.3 05/23/2020   CALCIUM 9.0 05/23/2020   GFRAA 98 02/15/2020   QFTBGOLDPLUS NEGATIVE 10/07/2019    Speciality Comments: PLQ Eye Exam: 01/06/2020 ABNORMAL @ [facility]. Patient advised to discontinue PLQ at office visit on 01/06/2020.  Prior Therapy: methotrexate/Xeljanz/Actemra (GI upset), Enbrel (injection site  reaction) and Orencia/Remicade/Humira/Cimzia/Rituxan/Simponi/Simponi Aria (inadequate response).  Procedures:  No procedures performed Allergies: Actemra [tocilizumab], Prochlorperazine, Ramipril, Shellfish-derived products, Atorvastatin, Compazine  [prochlorperazine edisylate], Emgality [galcanezumab-gnlm], Etanercept, Infliximab, Iohexol, Orencia [abatacept], Prochlorperazine edisylate, Shellfish allergy, Tofacitinib, Trokendi xr [topiramate er], Amiodarone, and Rituximab   Assessment / Plan:     Visit Diagnoses: No diagnosis found.  Orders: No orders of the defined types were placed in this encounter.  No orders of the defined types were placed in this encounter.   Face-to-face time spent with patient was *** minutes. Greater than 50% of time was spent in counseling and coordination of care.  Follow-Up Instructions: No follow-ups on file.   Ofilia Neas, PA-C  Note - This record has been created using Dragon software.  Chart creation errors have been sought, but may not always  have been located. Such creation errors do not reflect on  the standard of medical care.

## 2020-07-06 ENCOUNTER — Ambulatory Visit: Payer: 59 | Admitting: Physician Assistant

## 2020-07-11 ENCOUNTER — Other Ambulatory Visit: Payer: Self-pay | Admitting: Rheumatology

## 2020-07-11 DIAGNOSIS — M5137 Other intervertebral disc degeneration, lumbosacral region: Secondary | ICD-10-CM | POA: Diagnosis not present

## 2020-07-11 DIAGNOSIS — M6080 Other myositis, unspecified site: Secondary | ICD-10-CM | POA: Diagnosis not present

## 2020-07-11 DIAGNOSIS — M47815 Spondylosis without myelopathy or radiculopathy, thoracolumbar region: Secondary | ICD-10-CM | POA: Diagnosis not present

## 2020-07-11 DIAGNOSIS — R5383 Other fatigue: Secondary | ICD-10-CM | POA: Diagnosis not present

## 2020-07-11 DIAGNOSIS — M545 Low back pain: Secondary | ICD-10-CM | POA: Diagnosis not present

## 2020-07-11 DIAGNOSIS — G894 Chronic pain syndrome: Secondary | ICD-10-CM | POA: Diagnosis not present

## 2020-07-11 DIAGNOSIS — M2560 Stiffness of unspecified joint, not elsewhere classified: Secondary | ICD-10-CM | POA: Diagnosis not present

## 2020-07-11 NOTE — Telephone Encounter (Signed)
Last Visit: 05/23/2020 Next Visit: 07/13/2020  Current Dose per office note on 05/23/2020: She takes Flexeril 10 mg a mouth at bedtime DX: DDD (degenerative disc disease), lumbar  Okay to refill flexeril?

## 2020-07-13 ENCOUNTER — Other Ambulatory Visit: Payer: Self-pay

## 2020-07-13 ENCOUNTER — Ambulatory Visit: Payer: 59 | Admitting: Physician Assistant

## 2020-07-13 NOTE — Progress Notes (Signed)
Office Visit Note  Patient: [name]             Date of Birth: 12/22/1966           MRN: [id number]             PCP: McLean-Scocuzza, Nino Glow, MD Referring: Orland Mustard * Visit Date: 07/14/2020 Occupation: @GUAROCC @  Subjective:  Lower back pain.   History of Present Illness: [name] is a 54 y.o. female with history of rheumatoid arthritis.  She was a started on Rinvoq at the last visit.  She has noticed improvement in her symptoms.  She still has some discomfort in her hands and knees.  She states recently she has been having discomfort in the lower back is been going on for 3 to 4 weeks.  The pain gets worse when she lifts something.  She experiences increased pain when walking upstairs.  She denies nocturnal pain in her lower back.  Activities of Daily Living:  Patient reports morning stiffness for 1-1.5 hours.   Patient Reports nocturnal pain.  Difficulty dressing/grooming: Denies Difficulty climbing stairs: Reports Difficulty getting out of chair: Denies Difficulty using hands for taps, buttons, cutlery, and/or writing: Reports  Review of Systems  Constitutional: Positive for fatigue. Negative for night sweats, weight gain and weight loss.  HENT: Negative for mouth sores, trouble swallowing, trouble swallowing, mouth dryness and nose dryness.   Eyes: Positive for dryness. Negative for pain, redness, itching and visual disturbance.  Respiratory: Negative for cough, shortness of breath and difficulty breathing.   Cardiovascular: Negative for chest pain, palpitations, hypertension, irregular heartbeat and swelling in legs/feet.  Gastrointestinal: Positive for constipation. Negative for blood in stool and diarrhea.  Endocrine: Negative for increased urination.  Genitourinary: Negative for difficulty urinating and vaginal dryness.  Musculoskeletal: Positive for arthralgias, joint pain, joint swelling and morning stiffness. Negative for myalgias, muscle  weakness, muscle tenderness and myalgias.  Skin: Negative for color change, rash, hair loss, redness, skin tightness, ulcers and sensitivity to sunlight.  Allergic/Immunologic: Negative for susceptible to infections.  Neurological: Negative for dizziness, numbness, headaches, memory loss, night sweats and weakness.  Hematological: Positive for bruising/bleeding tendency. Negative for swollen glands.  Psychiatric/Behavioral: Negative for depressed mood, confusion and sleep disturbance. The patient is not nervous/anxious.     PMFS History:  Patient Active Problem List   Diagnosis Date Noted  . Snoring 05/12/2020  . Lung nodule 03/10/2020  . Colitis 03/10/2020  . Type 1 diabetes mellitus with proliferative retinopathy ([place]) 12/10/2019  . Abnormal thyroid function test 12/10/2019  . Bruxism (teeth grinding)   . Chronic migraine 10/07/2019  . Contusion of left hip 07/25/2019  . H/O multiple allergies 03/10/2019  . Hx of anaphylaxis 03/10/2019  . Cough 06/26/2018  . PND (post-nasal drip) 06/26/2018  . Vitamin D deficiency 07/04/2017  . B12 deficiency 07/04/2017  . Abnormal CT scan 06/19/2017  . Gastroesophageal reflux disease 06/19/2017  . Antiplatelet or antithrombotic long-term use 06/19/2017  . Nasal congestion 03/15/2017  . Graves disease 02/01/2017  . Sleep difficulties 02/01/2017  . No energy 02/01/2017  . Osteopenia 02/01/2017  . Hypertension, essential 05/08/2016  . IDDM (insulin dependent diabetes mellitus) 05/08/2016  . Coronary artery disease involving native coronary artery of native heart with angina pectoris ([place])   . Hyperthyroidism 11/09/2010  . SINUS TACHYCARDIA 11/08/2010  . DERMATITIS, ALLERGIC 07/20/2010  . EDEMA 07/12/2010  . DIZZINESS 11/14/2009  . ATRIAL FIBRILLATION 07/20/2009  . CAD, ARTERY BYPASS GRAFT 06/07/2009  .  ANGINA, STABLE/EXERTIONAL 06/02/2009  . HYPERLIPIDEMIA-MIXED 04/26/2009  . ACUT MI ANTEROLAT WALL SUBSQT EPIS CARE 04/26/2009  . Chest  pain 04/26/2009  . DIABETIC  RETINOPATHY 04/25/2009  . CARPAL TUNNEL SYNDROME, BILATERAL 04/25/2009  . TRIGGER FINGER 04/25/2009  . MIGRAINE W/O AURA W/INTRACT W/STATUS MIGRAINOSUS 02/19/2008  . ALLERGIC RHINITIS, SEASONAL 10/16/2006  . CHOLELITHIASIS 10/16/2006  . Rheumatoid arthritis ([place]) 10/16/2006    Past Medical History:  Diagnosis Date  . ACUT MI ANTEROLAT WALL SUBSQT EPIS CARE   . Acute maxillary sinusitis   . ALLERGIC RHINITIS, SEASONAL   . ARTHRITIS, RHEUMATOID    shoulders and hands Enbrel>leg swelling Dr. Estanislado Pandy  . Atrial fibrillation ([place])    a. after CABG.  . Back pain   . Bruxism (teeth grinding)   . CAD, ARTERY BYPASS GRAFT    a. DES to RCA in 2010 then LAD occlusion s/p CABG 3 06/07/2009 with LIMA to LAD, reverse SVG to D1, reverse SVG to distal RCA. b. Cath 05/08/2016 slightly hypodense region in the intermediate branch, however she had excellent flow, FFR was normal. Vein graft to PDA and the posterolateral branch is patent, patent LIMA to LAD, occluded SVG to diagonal.  . CARPAL TUNNEL SYNDROME, BILATERAL   . CHOLELITHIASIS   . Contrast media allergy   . DERMATITIS, ALLERGIC   . DIABETES MELLITUS, TYPE I    on insulin pump dx'ed age 72 y.o   . DIABETIC  RETINOPATHY   . Hiatal hernia   . HYPERLIPIDEMIA-MIXED   . HYPERTHYROIDISM    Dr. Dollene Cleveland  . Insulin pump in place   . MIGRAINE W/O AURA W/INTRACT W/STATUS MIGRAINOSUS 02/19/2008  . Psoriasis   . SINUS TACHYCARDIA 11/08/2010  . SVT (supraventricular tachycardia) ([facility])    after s/p CABG  . TRIGGER FINGER    all fingers b/l hands   . URI     Family History  Problem Relation Age of Onset  . Depression Mother   . Anxiety disorder Mother   . Colon polyps Father   . Diabetes Father        2  . Hypertension Father   . Parkinson's disease Father   . Diabetes Other   . Heart disease Other   . Hypertension Other   . Hyperlipidemia Other   . Depression Other   . Migraines Other   . Stroke  Paternal Grandmother   . Heart attack Neg Hx   . Colon cancer Neg Hx   . Stomach cancer Neg Hx   . Esophageal cancer Neg Hx    Past Surgical History:  Procedure Laterality Date  . ABDOMINAL HYSTERECTOMY     endometriomas b/l; total ? cervix removed; no h/o abnormal pap  . Caesarean section    . CARDIAC CATHETERIZATION N/A 05/08/2016   Procedure: Left Heart Cath and Cors/Grafts Angiography;  Surgeon: Burnell Blanks, MD;  Location: [facility];  Service: Cardiovascular;  Laterality: N/A;  . CARPAL TUNNEL RELEASE     b/l   . CATARACT EXTRACTION, BILATERAL    . CHOLECYSTECTOMY    . COLONOSCOPY WITH PROPOFOL N/A 03/25/2020   Procedure: COLONOSCOPY WITH PROPOFOL;  Surgeon: Jonathon Bellows, MD;  Location: [facility] ENDOSCOPY;  Service: Gastroenterology;  Laterality: N/A;  . CORONARY ARTERY BYPASS GRAFT    . ESOPHAGOGASTRODUODENOSCOPY (EGD) WITH PROPOFOL N/A 03/25/2020   Procedure: ESOPHAGOGASTRODUODENOSCOPY (EGD) WITH PROPOFOL;  Surgeon: Jonathon Bellows, MD;  Location: [facility] ENDOSCOPY;  Service: Gastroenterology;  Laterality: N/A;  . EYE SURGERY  laser x 2 for retinopathy   . LEFT HEART CATH AND CORS/GRAFTS ANGIOGRAPHY N/A 02/22/2020   Procedure: LEFT HEART CATH AND CORS/GRAFTS ANGIOGRAPHY;  Surgeon: Burnell Blanks, MD;  Location: [facility];  Service: Cardiovascular;  Laterality: N/A;  . TRIGGER FINGER RELEASE     b/l fingers all   . VITRECTOMY     b/l    Social History   Social History Narrative   Divorced. Has 2 kids(fraternal twins) daughters age 24. Works at [employer], Never smoked, denies ETOH, no drugs. Drinks diet coke. No exercise.       DPR daughters [name] and Tracye Szuch twins          Immunization History  Administered Date(s) Administered  . Influenza, Quadrivalent, Recombinant, Inj, Pf 10/14/2013  . Influenza, Seasonal, Injecte, Preservative Fre 09/19/2016  . Influenza,inj,Quad PF,6+ Mos 09/30/2014, 09/03/2018  . Influenza,inj,quad,  With Preservative 09/23/2017  . Influenza-Unspecified 09/30/2014, 09/30/2016, 09/03/2018, 09/10/2019  . PFIZER SARS-COV-2 Vaccination 12/28/2019, 01/18/2020  . Pneumococcal Conjugate-13 07/16/2016  . Pneumococcal Polysaccharide-23 01/01/2004, 10/15/2006, 01/04/2020  . Td 10/01/2003  . Tdap 01/09/2011, 06/25/2018     Objective: Vital Signs: BP 126/78 (BP Location: Left Arm, Patient Position: Sitting, Cuff Size: Normal)   Pulse 68   Resp 15   Ht 5\' 3"  (1.6 m)   Wt 172 lb 9.6 oz (78.3 kg)   BMI 30.57 kg/m    Physical Exam Vitals and nursing note reviewed.  Constitutional:      Appearance: She is well-developed.  HENT:     Head: Normocephalic and atraumatic.  Eyes:     Conjunctiva/sclera: Conjunctivae normal.  Cardiovascular:     Rate and Rhythm: Normal rate and regular rhythm.     Heart sounds: Normal heart sounds.  Pulmonary:     Effort: Pulmonary effort is normal.     Breath sounds: Normal breath sounds.  Abdominal:     General: Bowel sounds are normal.     Palpations: Abdomen is soft.  Musculoskeletal:     Cervical back: Normal range of motion.  Lymphadenopathy:     Cervical: No cervical adenopathy.  Skin:    General: Skin is warm and dry.     Capillary Refill: Capillary refill takes less than 2 seconds.  Neurological:     Mental Status: She is alert and oriented to person, place, and time.  Psychiatric:        Behavior: Behavior normal.      Musculoskeletal Exam: Patient is stiffness with range of motion of her cervical spine.  She has painful range of motion of her lumbar spine.  Shoulder abduction is limited to 120 degrees bilaterally.  She has contractures in her elbows.  She is some limitation range of motion of her wrist joints.  She is thickening of MCPs PIPs and DIP joints.  No synovitis was noted.  Hip joints and knee joints with good range of motion.  She had no warmth or swelling in any joints.  His MTP and PIP thickening in her feet.  CDAI Exam: CDAI  Score: 0.6  Patient Global: 3 mm; Provider Global: 3 mm Swollen: 0 ; Tender: 1  Joint Exam 07/14/2020      Right  Left  Lumbar Spine   Tender        Investigation: No additional findings.  Imaging: No results found.  Recent Labs: Lab Results  Component Value Date   WBC 4.2 05/23/2020   HGB 12.4 05/23/2020   PLT 157.0 05/23/2020  NA 141 05/23/2020   K 3.8 05/23/2020   CL 103 05/23/2020   CO2 27 05/23/2020   GLUCOSE 174 (H) 05/23/2020   BUN 14 05/23/2020   CREATININE 0.81 05/23/2020   BILITOT 0.4 05/23/2020   ALKPHOS 95 05/23/2020   AST 17 05/23/2020   ALT 12 05/23/2020   PROT 6.1 05/23/2020   ALBUMIN 4.3 05/23/2020   CALCIUM 9.0 05/23/2020   GFRAA 98 02/15/2020   QFTBGOLDPLUS NEGATIVE 10/07/2019    Speciality Comments: PLQ Eye Exam: 01/06/2020 ABNORMAL @ [facility]. Patient advised to discontinue PLQ at office visit on 01/06/2020.  Prior Therapy: methotrexate/Xeljanz/Actemra (GI upset), Enbrel (injection site reaction) and Orencia/Remicade/Humira/Cimzia/Rituxan/Simponi/Simponi Aria (inadequate response).  Procedures:  No procedures performed Allergies: Actemra [tocilizumab], Prochlorperazine, Ramipril, Shellfish-derived products, Atorvastatin, Compazine  [prochlorperazine edisylate], Emgality [galcanezumab-gnlm], Etanercept, Infliximab, Iohexol, Orencia [abatacept], Prochlorperazine edisylate, Shellfish allergy, Tofacitinib, Trokendi xr [topiramate er], Amiodarone, and Rituximab   Assessment / Plan:     Visit Diagnoses: Rheumatoid arthritis of multiple sites with negative rheumatoid factor ([place]) -she is doing much better since she started taking Rinvoq.  She is on Rinvoq and Arava combination currently.  She continues to have some joint pain and stiffness but had no swelling.  On my examination she had no synovitis today.  Will obtain x-rays to monitor her disease process.  Plan: XR Hand 2 View Right, XR Hand 2 View Left, XR Foot 2 Views Right,  XR Foot 2 Views Left.  X-rays obtained today were consistent with rheumatoid arthritis and osteoarthritis.  No erosive changes were noted.  No radiographic progression was noted when compared to films of 2018.  High risk medication use - Plan: CBC with Differential/Platelet, COMPLETE METABOLIC PANEL WITH GFR we will check labs today and then every 3 months to monitor for drug toxicity.  She will need TB Gold with her next labs.  Psoriasis-she has no active psoriasis lesions.  Contracture of joint of both elbows-chronic.  Other insomnia-she takes Flexeril for insomnia.  Other fatigue  Osteopenia of multiple sites-she is on calcium and vitamin D.  Chronic midline low back pain without sciatica -she has chronic lower back pain which has been increased recently.  She had x-rays in February 2021 which are reviewed and it showed diffuse degenerative changes.  A handout on back exercises was given.  DDD (degenerative disc disease), lumbar  DDD (degenerative disc disease), thoracic  History of vitamin D deficiency  Abnormal SPEP  History of hypertension  History of hyperlipidemia  History of coronary artery disease  History of Graves' disease  History of gastroesophageal reflux (GERD)  History of diabetes mellitus  History of migraine  Orders: Orders Placed This Encounter  Procedures  . XR Hand 2 View Right  . XR Hand 2 View Left  . XR Foot 2 Views Right  . XR Foot 2 Views Left  . XR Lumbar Spine 2-3 Views  . CBC with Differential/Platelet  . COMPLETE METABOLIC PANEL WITH GFR   No orders of the defined types were placed in this encounter.    Follow-Up Instructions: Return in about 3 months (around 10/14/2020) for Rheumatoid arthritis.   Bo Merino, MD  Note - This record has been created using Editor, commissioning.  Chart creation errors have been sought, but may not always  have been located. Such creation errors do not reflect on  the standard of medical  care.

## 2020-07-14 ENCOUNTER — Ambulatory Visit: Payer: Self-pay

## 2020-07-14 ENCOUNTER — Other Ambulatory Visit: Payer: Self-pay

## 2020-07-14 ENCOUNTER — Ambulatory Visit: Payer: 59 | Admitting: Rheumatology

## 2020-07-14 ENCOUNTER — Encounter: Payer: Self-pay | Admitting: Physician Assistant

## 2020-07-14 VITALS — BP 126/78 | HR 68 | Resp 15 | Ht 63.0 in | Wt 172.6 lb

## 2020-07-14 DIAGNOSIS — G8929 Other chronic pain: Secondary | ICD-10-CM

## 2020-07-14 DIAGNOSIS — Z79899 Other long term (current) drug therapy: Secondary | ICD-10-CM

## 2020-07-14 DIAGNOSIS — M24521 Contracture, right elbow: Secondary | ICD-10-CM

## 2020-07-14 DIAGNOSIS — G4709 Other insomnia: Secondary | ICD-10-CM | POA: Diagnosis not present

## 2020-07-14 DIAGNOSIS — M5136 Other intervertebral disc degeneration, lumbar region: Secondary | ICD-10-CM | POA: Diagnosis not present

## 2020-07-14 DIAGNOSIS — R778 Other specified abnormalities of plasma proteins: Secondary | ICD-10-CM

## 2020-07-14 DIAGNOSIS — M8589 Other specified disorders of bone density and structure, multiple sites: Secondary | ICD-10-CM

## 2020-07-14 DIAGNOSIS — L409 Psoriasis, unspecified: Secondary | ICD-10-CM

## 2020-07-14 DIAGNOSIS — Z8719 Personal history of other diseases of the digestive system: Secondary | ICD-10-CM

## 2020-07-14 DIAGNOSIS — R5383 Other fatigue: Secondary | ICD-10-CM | POA: Diagnosis not present

## 2020-07-14 DIAGNOSIS — Z8679 Personal history of other diseases of the circulatory system: Secondary | ICD-10-CM

## 2020-07-14 DIAGNOSIS — Z8639 Personal history of other endocrine, nutritional and metabolic disease: Secondary | ICD-10-CM

## 2020-07-14 DIAGNOSIS — M545 Low back pain: Secondary | ICD-10-CM

## 2020-07-14 DIAGNOSIS — M0609 Rheumatoid arthritis without rheumatoid factor, multiple sites: Secondary | ICD-10-CM

## 2020-07-14 DIAGNOSIS — M5134 Other intervertebral disc degeneration, thoracic region: Secondary | ICD-10-CM

## 2020-07-14 DIAGNOSIS — Z8669 Personal history of other diseases of the nervous system and sense organs: Secondary | ICD-10-CM

## 2020-07-14 DIAGNOSIS — M24522 Contracture, left elbow: Secondary | ICD-10-CM

## 2020-07-14 NOTE — Patient Instructions (Addendum)
Standing Labs We placed an order today for your standing lab work.   Please have your standing labs drawn in October and every 3 months  If possible, please have your labs drawn 2 weeks prior to your appointment so that the provider can discuss your results at your appointment.  We have open lab daily Monday through Thursday from 8:30-12:30 PM and 1:30-4:30 PM and Friday from 8:30-12:30 PM and 1:30-4:00 PM at the office of Dr. Bo Merino, [facility].   Please be advised, patients with office appointments requiring lab work will take precedents over walk-in lab work.  If possible, please come for your lab work on Monday and Friday afternoons, as you may experience shorter wait times. The office is located at [address] [place], [place], [place] [address] No appointment is necessary.   Labs are drawn by [facility]. Please bring your co-pay at the time of your lab draw.  You may receive a bill from [facility] for your lab work.  If you wish to have your labs drawn at another location, please call the office 24 hours in advance to send orders.  If you have any questions regarding directions or hours of operation,  please call [phone].   As a reminder, please drink plenty of water prior to coming for your lab work. Thanks!  Back Exercises The following exercises strengthen the muscles that help to support the trunk and back. They also help to keep the lower back flexible. Doing these exercises can help to prevent back pain or lessen existing pain.  If you have back pain or discomfort, try doing these exercises 2-3 times each day or as told by your health care provider.  As your pain improves, do them once each day, but increase the number of times that you repeat the steps for each exercise (do more repetitions).  To prevent the recurrence of back pain, continue to do these exercises once each day or as told by your health care provider. Do exercises exactly as told by  your health care provider and adjust them as directed. It is normal to feel mild stretching, pulling, tightness, or discomfort as you do these exercises, but you should stop right away if you feel sudden pain or your pain gets worse. Exercises Single knee to chest Repeat these steps 3-5 times for each leg: 1. Lie on your back on a firm bed or the floor with your legs extended. 2. Bring one knee to your chest. Your other leg should stay extended and in contact with the floor. 3. Hold your knee in place by grabbing your knee or thigh with both hands and hold. 4. Pull on your knee until you feel a gentle stretch in your lower back or buttocks. 5. Hold the stretch for 10-30 seconds. 6. Slowly release and straighten your leg. Pelvic tilt Repeat these steps 5-10 times: 1. Lie on your back on a firm bed or the floor with your legs extended. 2. Bend your knees so they are pointing toward the ceiling and your feet are flat on the floor. 3. Tighten your lower abdominal muscles to press your lower back against the floor. This motion will tilt your pelvis so your tailbone points up toward the ceiling instead of pointing to your feet or the floor. 4. With gentle tension and even breathing, hold this position for 5-10 seconds. Cat-cow Repeat these steps until your lower back becomes more flexible: 1. Get into a hands-and-knees position on a firm surface. Keep  your hands under your shoulders, and keep your knees under your hips. You may place padding under your knees for comfort. 2. Let your head hang down toward your chest. Contract your abdominal muscles and point your tailbone toward the floor so your lower back becomes rounded like the back of a cat. 3. Hold this position for 5 seconds. 4. Slowly lift your head, let your abdominal muscles relax and point your tailbone up toward the ceiling so your back forms a sagging arch like the back of a cow. 5. Hold this position for 5 seconds.  Press-ups Repeat  these steps 5-10 times: 1. Lie on your abdomen (face-down) on the floor. 2. Place your palms near your head, about shoulder-width apart. 3. Keeping your back as relaxed as possible and keeping your hips on the floor, slowly straighten your arms to raise the top half of your body and lift your shoulders. Do not use your back muscles to raise your upper torso. You may adjust the placement of your hands to make yourself more comfortable. 4. Hold this position for 5 seconds while you keep your back relaxed. 5. Slowly return to lying flat on the floor.  Bridges Repeat these steps 10 times: 1. Lie on your back on a firm surface. 2. Bend your knees so they are pointing toward the ceiling and your feet are flat on the floor. Your arms should be flat at your sides, next to your body. 3. Tighten your buttocks muscles and lift your buttocks off the floor until your waist is at almost the same height as your knees. You should feel the muscles working in your buttocks and the back of your thighs. If you do not feel these muscles, slide your feet 1-2 inches farther away from your buttocks. 4. Hold this position for 3-5 seconds. 5. Slowly lower your hips to the starting position, and allow your buttocks muscles to relax completely. If this exercise is too easy, try doing it with your arms crossed over your chest. Abdominal crunches Repeat these steps 5-10 times: 1. Lie on your back on a firm bed or the floor with your legs extended. 2. Bend your knees so they are pointing toward the ceiling and your feet are flat on the floor. 3. Cross your arms over your chest. 4. Tip your chin slightly toward your chest without bending your neck. 5. Tighten your abdominal muscles and slowly raise your trunk (torso) high enough to lift your shoulder blades a tiny bit off the floor. Avoid raising your torso higher than that because it can put too much stress on your low back and does not help to strengthen your abdominal  muscles. 6. Slowly return to your starting position. Back lifts Repeat these steps 5-10 times: 1. Lie on your abdomen (face-down) with your arms at your sides, and rest your forehead on the floor. 2. Tighten the muscles in your legs and your buttocks. 3. Slowly lift your chest off the floor while you keep your hips pressed to the floor. Keep the back of your head in line with the curve in your back. Your eyes should be looking at the floor. 4. Hold this position for 3-5 seconds. 5. Slowly return to your starting position. Contact a health care provider if:  Your back pain or discomfort gets much worse when you do an exercise.  Your worsening back pain or discomfort does not lessen within 2 hours after you exercise. If you have any of these problems, stop doing these  exercises right away. Do not do them again unless your health care provider says that you can. Get help right away if:  You develop sudden, severe back pain. If this happens, stop doing the exercises right away. Do not do them again unless your health care provider says that you can. This information is not intended to replace advice given to you by your health care provider. Make sure you discuss any questions you have with your health care provider. Document Revised: 04/23/2019 Document Reviewed: 09/18/2018 Elsevier Patient Education  [place].

## 2020-07-15 LAB — COMPLETE METABOLIC PANEL WITH GFR
AG Ratio: 2.4 (calc) (ref 1.0–2.5)
ALT: 31 U/L — ABNORMAL HIGH (ref 6–29)
AST: 34 U/L (ref 10–35)
Albumin: 4.6 g/dL (ref 3.6–5.1)
Alkaline phosphatase (APISO): 91 U/L (ref 37–153)
BUN: 17 mg/dL (ref 7–25)
CO2: 30 mmol/L (ref 20–32)
Calcium: 9.4 mg/dL (ref 8.6–10.4)
Chloride: 99 mmol/L (ref 98–110)
Creat: 0.99 mg/dL (ref 0.50–1.05)
GFR, Est African American: 76 mL/min/{1.73_m2} (ref >=60)
GFR, Est Non African American: 66 mL/min/{1.73_m2} (ref >=60)
Globulin: 1.9 g/dL (calc) (ref 1.9–3.7)
Glucose, Bld: 107 mg/dL — ABNORMAL HIGH (ref 65–99)
Potassium: 3.6 mmol/L (ref 3.5–5.3)
Sodium: 139 mmol/L (ref 135–146)
Total Bilirubin: 0.5 mg/dL (ref 0.2–1.2)
Total Protein: 6.5 g/dL (ref 6.1–8.1)

## 2020-07-15 LAB — CBC WITH DIFFERENTIAL/PLATELET
Absolute Monocytes: 551 cells/uL (ref 200–950)
Basophils Absolute: 42 cells/uL (ref 0–200)
Basophils Relative: 0.8 %
Eosinophils Absolute: 133 cells/uL (ref 15–500)
Eosinophils Relative: 2.5 %
HCT: 39.6 % (ref 35.0–45.0)
Hemoglobin: 13.3 g/dL (ref 11.7–15.5)
Lymphs Abs: 2873 cells/uL (ref 850–3900)
MCH: 30 pg (ref 27.0–33.0)
MCHC: 33.6 g/dL (ref 32.0–36.0)
MCV: 89.2 fL (ref 80.0–100.0)
MPV: 10 fL (ref 7.5–12.5)
Monocytes Relative: 10.4 %
Neutro Abs: 1701 cells/uL (ref 1500–7800)
Neutrophils Relative %: 32.1 %
Platelets: 213 10*3/uL (ref 140–400)
RBC: 4.44 10*6/uL (ref 3.80–5.10)
RDW: 12.1 % (ref 11.0–15.0)
Total Lymphocyte: 54.2 %
WBC: 5.3 10*3/uL (ref 3.8–10.8)

## 2020-07-15 NOTE — Progress Notes (Signed)
ALT is mildly elevated.  Patient is on statins.  We will continue to monitor labs.

## 2020-07-25 ENCOUNTER — Other Ambulatory Visit: Payer: Self-pay | Admitting: Physician Assistant

## 2020-07-25 MED FILL — RINVOQ 15 MG TB24: 15 | 30 days supply | Qty: 30 | Fill #2

## 2020-07-25 NOTE — Telephone Encounter (Signed)
Last Visit: 07/14/2020 Next Visit: 10/13/2020 Labs: 07/14/2020 ALT is mildly elevated. Patient is on statins.   Current Dose per office note 05/23/2020: Arava 20 mg 1 tablet by mouth daily.   DX: Rheumatoid arthritis of multiple sites with negative rheumatoid factor   Okay to refill Arava?

## 2020-07-27 DIAGNOSIS — E1065 Type 1 diabetes mellitus with hyperglycemia: Secondary | ICD-10-CM | POA: Diagnosis not present

## 2020-07-27 DIAGNOSIS — E1039 Type 1 diabetes mellitus with other diabetic ophthalmic complication: Secondary | ICD-10-CM | POA: Diagnosis not present

## 2020-08-08 ENCOUNTER — Other Ambulatory Visit: Payer: Self-pay | Admitting: Rheumatology

## 2020-08-08 NOTE — Telephone Encounter (Signed)
Last Visit: 07/14/2020 Next Visit: 10/13/2020  Last Fill: 07/11/2020  Okay to refill Cyclobenzaprine?

## 2020-08-11 DIAGNOSIS — H524 Presbyopia: Secondary | ICD-10-CM | POA: Diagnosis not present

## 2020-08-11 DIAGNOSIS — H40053 Ocular hypertension, bilateral: Secondary | ICD-10-CM | POA: Diagnosis not present

## 2020-08-11 DIAGNOSIS — Z9841 Cataract extraction status, right eye: Secondary | ICD-10-CM | POA: Diagnosis not present

## 2020-08-11 DIAGNOSIS — H40023 Open angle with borderline findings, high risk, bilateral: Secondary | ICD-10-CM | POA: Diagnosis not present

## 2020-08-11 DIAGNOSIS — H52213 Irregular astigmatism, bilateral: Secondary | ICD-10-CM | POA: Diagnosis not present

## 2020-08-11 DIAGNOSIS — Z9842 Cataract extraction status, left eye: Secondary | ICD-10-CM | POA: Diagnosis not present

## 2020-08-11 DIAGNOSIS — H5212 Myopia, left eye: Secondary | ICD-10-CM | POA: Diagnosis not present

## 2020-08-11 LAB — HM DIABETES EYE EXAM

## 2020-08-18 ENCOUNTER — Other Ambulatory Visit: Payer: Self-pay | Admitting: Internal Medicine

## 2020-08-18 DIAGNOSIS — M0609 Rheumatoid arthritis without rheumatoid factor, multiple sites: Secondary | ICD-10-CM

## 2020-08-22 DIAGNOSIS — E0500 Thyrotoxicosis with diffuse goiter without thyrotoxic crisis or storm: Secondary | ICD-10-CM | POA: Diagnosis not present

## 2020-08-22 DIAGNOSIS — E10319 Type 1 diabetes mellitus with unspecified diabetic retinopathy without macular edema: Secondary | ICD-10-CM | POA: Diagnosis not present

## 2020-08-24 ENCOUNTER — Other Ambulatory Visit: Payer: Self-pay | Admitting: *Deleted

## 2020-08-24 DIAGNOSIS — M0609 Rheumatoid arthritis without rheumatoid factor, multiple sites: Secondary | ICD-10-CM

## 2020-08-24 MED ORDER — RINVOQ 15 MG PO TB24: 15.0000 mg | ORAL_TABLET | Freq: Every day | ORAL | 0 refills | Status: DC

## 2020-08-24 MED FILL — RINVOQ 15 MG TB24: 15 | 30 days supply | Qty: 30 | Fill #0

## 2020-08-24 NOTE — Telephone Encounter (Signed)
Refill request received via fax  Last Visit: 07/14/2020 Next Visit: 10/13/2020 Labs: 07/14/2020 ALT is mildly elevated. Patient is on statins. We will continue to monitor labs. TB Gold: 10/07/2019 Neg   Current Dose per office note 07/14/2020: not discussed   HE:NIDPOEUMPN arthritis of multiple sites with negative rheumatoid factor  Okay to refill Rinvoq?

## 2020-08-28 ENCOUNTER — Encounter (HOSPITAL_BASED_OUTPATIENT_CLINIC_OR_DEPARTMENT_OTHER): Payer: Self-pay | Admitting: Emergency Medicine

## 2020-08-28 ENCOUNTER — Emergency Department (HOSPITAL_BASED_OUTPATIENT_CLINIC_OR_DEPARTMENT_OTHER): Payer: 59

## 2020-08-28 ENCOUNTER — Emergency Department (HOSPITAL_BASED_OUTPATIENT_CLINIC_OR_DEPARTMENT_OTHER)
Admission: EM | Admit: 2020-08-28 | Discharge: 2020-08-28 | Disposition: A | Discharge status: Home / Self Care | Payer: 59 | Attending: Emergency Medicine | Admitting: Emergency Medicine

## 2020-08-28 ENCOUNTER — Other Ambulatory Visit: Payer: Self-pay

## 2020-08-28 DIAGNOSIS — Z7982 Long term (current) use of aspirin: Secondary | ICD-10-CM | POA: Insufficient documentation

## 2020-08-28 DIAGNOSIS — I25110 Atherosclerotic heart disease of native coronary artery with unstable angina pectoris: Secondary | ICD-10-CM | POA: Insufficient documentation

## 2020-08-28 DIAGNOSIS — I10 Essential (primary) hypertension: Secondary | ICD-10-CM | POA: Diagnosis not present

## 2020-08-28 DIAGNOSIS — R109 Unspecified abdominal pain: Secondary | ICD-10-CM | POA: Diagnosis present

## 2020-08-28 DIAGNOSIS — Z79899 Other long term (current) drug therapy: Secondary | ICD-10-CM | POA: Diagnosis not present

## 2020-08-28 DIAGNOSIS — I700 Atherosclerosis of aorta: Secondary | ICD-10-CM | POA: Diagnosis not present

## 2020-08-28 DIAGNOSIS — E10319 Type 1 diabetes mellitus with unspecified diabetic retinopathy without macular edema: Secondary | ICD-10-CM | POA: Insufficient documentation

## 2020-08-28 DIAGNOSIS — E039 Hypothyroidism, unspecified: Secondary | ICD-10-CM | POA: Insufficient documentation

## 2020-08-28 DIAGNOSIS — K8689 Other specified diseases of pancreas: Secondary | ICD-10-CM | POA: Diagnosis not present

## 2020-08-28 DIAGNOSIS — Z7984 Long term (current) use of oral hypoglycemic drugs: Secondary | ICD-10-CM | POA: Insufficient documentation

## 2020-08-28 DIAGNOSIS — K529 Noninfective gastroenteritis and colitis, unspecified: Secondary | ICD-10-CM | POA: Diagnosis not present

## 2020-08-28 DIAGNOSIS — I708 Atherosclerosis of other arteries: Secondary | ICD-10-CM | POA: Diagnosis not present

## 2020-08-28 DIAGNOSIS — K219 Gastro-esophageal reflux disease without esophagitis: Secondary | ICD-10-CM | POA: Diagnosis not present

## 2020-08-28 DIAGNOSIS — K3189 Other diseases of stomach and duodenum: Secondary | ICD-10-CM | POA: Diagnosis not present

## 2020-08-28 LAB — URINALYSIS, ROUTINE W REFLEX MICROSCOPIC
Bilirubin Urine: NEGATIVE
Glucose, UA: NEGATIVE mg/dL
Hgb urine dipstick: NEGATIVE
Ketones, ur: 15 mg/dL — AB
Leukocytes,Ua: NEGATIVE
Nitrite: NEGATIVE
Protein, ur: NEGATIVE mg/dL
Specific Gravity, Urine: 1.025 (ref 1.005–1.030)
pH: 5 (ref 5.0–8.0)

## 2020-08-28 LAB — CBC WITH DIFFERENTIAL/PLATELET
Abs Immature Granulocytes: 0.05 10*3/uL (ref 0.00–0.07)
Basophils Absolute: 0 10*3/uL (ref 0.0–0.1)
Basophils Relative: 0 %
Eosinophils Absolute: 0 10*3/uL (ref 0.0–0.5)
Eosinophils Relative: 0 %
HCT: 34.5 % — ABNORMAL LOW (ref 36.0–46.0)
Hemoglobin: 12 g/dL (ref 12.0–15.0)
Immature Granulocytes: 1 %
Lymphocytes Relative: 9 %
Lymphs Abs: 0.9 10*3/uL (ref 0.7–4.0)
MCH: 31.5 pg (ref 26.0–34.0)
MCHC: 34.8 g/dL (ref 30.0–36.0)
MCV: 90.6 fL (ref 80.0–100.0)
Monocytes Absolute: 0.6 10*3/uL (ref 0.1–1.0)
Monocytes Relative: 6 %
Neutro Abs: 8 10*3/uL — ABNORMAL HIGH (ref 1.7–7.7)
Neutrophils Relative %: 84 %
Platelets: 160 10*3/uL (ref 150–400)
RBC: 3.81 MIL/uL — ABNORMAL LOW (ref 3.87–5.11)
RDW: 12.3 % (ref 11.5–15.5)
WBC: 9.7 10*3/uL (ref 4.0–10.5)
nRBC: 0 % (ref 0.0–0.2)

## 2020-08-28 LAB — COMPREHENSIVE METABOLIC PANEL
ALT: 24 U/L (ref 0–44)
AST: 31 U/L (ref 15–41)
Albumin: 4.2 g/dL (ref 3.5–5.0)
Alkaline Phosphatase: 65 U/L (ref 38–126)
Anion gap: 11 (ref 5–15)
BUN: 19 mg/dL (ref 6–20)
CO2: 24 mmol/L (ref 22–32)
Calcium: 8.7 mg/dL — ABNORMAL LOW (ref 8.9–10.3)
Chloride: 103 mmol/L (ref 98–111)
Creatinine, Ser: 0.88 mg/dL (ref 0.44–1.00)
GFR calc Af Amer: >60 mL/min (ref >60)
GFR calc non Af Amer: >60 mL/min (ref >60)
Glucose, Bld: 177 mg/dL — ABNORMAL HIGH (ref 70–99)
Potassium: 3.7 mmol/L (ref 3.5–5.1)
Sodium: 138 mmol/L (ref 135–145)
Total Bilirubin: 0.7 mg/dL (ref 0.3–1.2)
Total Protein: 6.6 g/dL (ref 6.5–8.1)

## 2020-08-28 LAB — LIPASE, BLOOD: Lipase: 18 U/L (ref 11–51)

## 2020-08-28 MED ORDER — MORPHINE SULFATE (PF) 4 MG/ML IV SOLN
4.0000 mg | Freq: Once | INTRAVENOUS | Status: AC
  Administered 2020-08-28: 4 mg via INTRAVENOUS
  Filled 2020-08-28: qty 1

## 2020-08-28 MED ORDER — ONDANSETRON 4 MG PO TBDP: 4.0000 mg | ORAL_TABLET | Freq: Three times a day (TID) | ORAL | 0 refills | Status: DC | PRN

## 2020-08-28 MED ORDER — SODIUM CHLORIDE 0.9 % IV BOLUS
1000.0000 mL | Freq: Once | INTRAVENOUS | Status: AC
  Administered 2020-08-28: 1000 mL via INTRAVENOUS

## 2020-08-28 MED ORDER — DICYCLOMINE HCL 20 MG PO TABS: 20.0000 mg | ORAL_TABLET | Freq: Two times a day (BID) | ORAL | 0 refills | Status: DC | PRN

## 2020-08-28 MED ORDER — AZITHROMYCIN 250 MG PO TABS: 250.0000 mg | ORAL_TABLET | Freq: Every day | ORAL | 0 refills | Status: DC

## 2020-08-28 MED ORDER — ONDANSETRON HCL 4 MG/2ML IJ SOLN
4.0000 mg | Freq: Once | INTRAMUSCULAR | Status: AC
  Administered 2020-08-28: 4 mg via INTRAVENOUS
  Filled 2020-08-28: qty 2

## 2020-08-28 MED ORDER — PROMETHAZINE HCL 25 MG PO TABS: 25.0000 mg | ORAL_TABLET | Freq: Four times a day (QID) | ORAL | 0 refills | Status: DC | PRN

## 2020-08-28 NOTE — ED Notes (Signed)
Pt drinking contrast for ct scan

## 2020-08-28 NOTE — ED Provider Notes (Signed)
[facility] EMERGENCY DEPARTMENT Provider Note   CSN: [id number] Arrival date & time: 08/28/20  0845     History Chief Complaint  Patient presents with  . Abdominal Pain    [name] is a 54 y.o. female.  HPI      54 year old female with history of type 1 diabetes, coronary artery disease, hypothyroidism, hyperlipidemia, atrial fibrillation, cholecystectomy, hysterectomy, presents with concern for abdominal pain, nausea and vomiting.  Reports that she has had intermittent pain over the last 2 weeks, with significant worsening last night.  Reports no real triggers for the pain that she has had, however last night after dinner her pain significantly worsen.  Reports severe right lower quadrant abdominal pain with radiation as well to the upper right upper quadrant.  Describes as sharp pain.  Nothing seems to make it better or worse.  Reports associated nausea and vomiting which she had had occasionally, but reports since last night had several episodes.  Reports she had 2 episodes of diarrhea yesterday, and now feels the urge to have a bowel movement but cannot.  Denies any fevers but reports low baseline temperatures.  Reports she did have some dysuria this morning.   She has been vaccinated against COVID 19. NO known sick contacts.   Past Medical History:  Diagnosis Date  . ACUT MI ANTEROLAT WALL SUBSQT EPIS CARE   . Acute maxillary sinusitis   . ALLERGIC RHINITIS, SEASONAL   . ARTHRITIS, RHEUMATOID    shoulders and hands Enbrel>leg swelling Dr. Estanislado Pandy  . Atrial fibrillation ([place])    a. after CABG.  . Back pain   . Bruxism (teeth grinding)   . CAD, ARTERY BYPASS GRAFT    a. DES to RCA in 2010 then LAD occlusion s/p CABG 3 06/07/2009 with LIMA to LAD, reverse SVG to D1, reverse SVG to distal RCA. b. Cath 05/08/2016 slightly hypodense region in the intermediate branch, however she had excellent flow, FFR was normal. Vein graft to PDA and the posterolateral branch is  patent, patent LIMA to LAD, occluded SVG to diagonal.  . CARPAL TUNNEL SYNDROME, BILATERAL   . CHOLELITHIASIS   . Contrast media allergy   . DERMATITIS, ALLERGIC   . DIABETES MELLITUS, TYPE I    on insulin pump dx'ed age 79 y.o   . DIABETIC  RETINOPATHY   . Hiatal hernia   . HYPERLIPIDEMIA-MIXED   . HYPERTHYROIDISM    Dr. Dollene Cleveland  . Insulin pump in place   . MIGRAINE W/O AURA W/INTRACT W/STATUS MIGRAINOSUS 02/19/2008  . Psoriasis   . SINUS TACHYCARDIA 11/08/2010  . SVT (supraventricular tachycardia) ([facility])    after s/p CABG  . TRIGGER FINGER    all fingers b/l hands   . URI     Patient Active Problem List   Diagnosis Date Noted  . Snoring 05/12/2020  . Lung nodule 03/10/2020  . Colitis 03/10/2020  . Type 1 diabetes mellitus with proliferative retinopathy ([place]) 12/10/2019  . Abnormal thyroid function test 12/10/2019  . Bruxism (teeth grinding)   . Chronic migraine 10/07/2019  . Contusion of left hip 07/25/2019  . H/O multiple allergies 03/10/2019  . Hx of anaphylaxis 03/10/2019  . Cough 06/26/2018  . PND (post-nasal drip) 06/26/2018  . Vitamin D deficiency 07/04/2017  . B12 deficiency 07/04/2017  . Abnormal CT scan 06/19/2017  . Gastroesophageal reflux disease 06/19/2017  . Antiplatelet or antithrombotic long-term use 06/19/2017  . Nasal congestion 03/15/2017  . Graves disease 02/01/2017  . Sleep  difficulties 02/01/2017  . No energy 02/01/2017  . Osteopenia 02/01/2017  . Hypertension, essential 05/08/2016  . IDDM (insulin dependent diabetes mellitus) 05/08/2016  . Coronary artery disease involving native coronary artery of native heart with angina pectoris ([place])   . Hyperthyroidism 11/09/2010  . SINUS TACHYCARDIA 11/08/2010  . DERMATITIS, ALLERGIC 07/20/2010  . EDEMA 07/12/2010  . DIZZINESS 11/14/2009  . ATRIAL FIBRILLATION 07/20/2009  . CAD, ARTERY BYPASS GRAFT 06/07/2009  . ANGINA, STABLE/EXERTIONAL 06/02/2009  . HYPERLIPIDEMIA-MIXED 04/26/2009  . ACUT  MI ANTEROLAT WALL SUBSQT EPIS CARE 04/26/2009  . Chest pain 04/26/2009  . DIABETIC  RETINOPATHY 04/25/2009  . CARPAL TUNNEL SYNDROME, BILATERAL 04/25/2009  . TRIGGER FINGER 04/25/2009  . MIGRAINE W/O AURA W/INTRACT W/STATUS MIGRAINOSUS 02/19/2008  . ALLERGIC RHINITIS, SEASONAL 10/16/2006  . CHOLELITHIASIS 10/16/2006  . Rheumatoid arthritis ([place]) 10/16/2006    Past Surgical History:  Procedure Laterality Date  . ABDOMINAL HYSTERECTOMY     endometriomas b/l; total ? cervix removed; no h/o abnormal pap  . Caesarean section    . CARDIAC CATHETERIZATION N/A 05/08/2016   Procedure: Left Heart Cath and Cors/Grafts Angiography;  Surgeon: Burnell Blanks, MD;  Location: [place] [facility];  Service: Cardiovascular;  Laterality: N/A;  . CARPAL TUNNEL RELEASE     b/l   . CATARACT EXTRACTION, BILATERAL    . CHOLECYSTECTOMY    . COLONOSCOPY WITH PROPOFOL N/A 03/25/2020   Procedure: COLONOSCOPY WITH PROPOFOL;  Surgeon: Jonathon Bellows, MD;  Location: [facility] ENDOSCOPY;  Service: Gastroenterology;  Laterality: N/A;  . CORONARY ARTERY BYPASS GRAFT    . ESOPHAGOGASTRODUODENOSCOPY (EGD) WITH PROPOFOL N/A 03/25/2020   Procedure: ESOPHAGOGASTRODUODENOSCOPY (EGD) WITH PROPOFOL;  Surgeon: Jonathon Bellows, MD;  Location: [facility] ENDOSCOPY;  Service: Gastroenterology;  Laterality: N/A;  . EYE SURGERY     laser x 2 for retinopathy   . LEFT HEART CATH AND CORS/GRAFTS ANGIOGRAPHY N/A 02/22/2020   Procedure: LEFT HEART CATH AND CORS/GRAFTS ANGIOGRAPHY;  Surgeon: Burnell Blanks, MD;  Location: [place] [facility] LAB;  Service: Cardiovascular;  Laterality: N/A;  . TRIGGER FINGER RELEASE     b/l fingers all   . VITRECTOMY     b/l      OB History   No obstetric history on file.     Family History  Problem Relation Age of Onset  . Depression Mother   . Anxiety disorder Mother   . Colon polyps Father   . Diabetes Father        2  . Hypertension Father   . Parkinson's disease Father   . Diabetes Other   .  Heart disease Other   . Hypertension Other   . Hyperlipidemia Other   . Depression Other   . Migraines Other   . Stroke Paternal Grandmother   . Heart attack Neg Hx   . Colon cancer Neg Hx   . Stomach cancer Neg Hx   . Esophageal cancer Neg Hx     Social History   Tobacco Use  . Smoking status: Never Smoker  . Smokeless tobacco: Never Used  Vaping Use  . Vaping Use: Never used  Substance Use Topics  . Alcohol use: Yes    Comment: rarely 1 every 6 months  . Drug use: No    Home Medications Prior to Admission medications   Medication Sig Start Date End Date Taking? Authorizing Provider  amLODipine (NORVASC) 5 MG tablet Take 0.5 tablets (2.5 mg total) by mouth daily. 03/10/20   McLean-Scocuzza, Nino Glow, MD  aspirin  EC 81 MG tablet Take 81 mg by mouth daily.    [provider]  azithromycin (ZITHROMAX) 250 MG tablet Take 1 tablet (250 mg total) by mouth daily. Take first 2 tablets together, then 1 every day until finished. 08/28/20   Gareth Morgan, MD  CHELATED MAGNESIUM PO Take 250 mg by mouth daily.    [provider]  Cholecalciferol (VITAMIN D) 125 MCG (5000 UT) CAPS Take 5,000 Units by mouth daily.    [provider]  clopidogrel (PLAVIX) 75 MG tablet TAKE 1 TABLET BY MOUTH ONCE DAILY 03/15/20   Sherren Mocha, MD  cyclobenzaprine (FLEXERIL) 10 MG tablet TAKE 1 TABLET BY MOUTH AT BEDTIME 08/08/20   Bo Merino, MD  dicyclomine (BENTYL) 20 MG tablet Take 1 tablet (20 mg total) by mouth 2 (two) times daily as needed for spasms (abdominal pain). 08/28/20   Gareth Morgan, MD  EPINEPHrine 0.3 mg/0.3 mL IJ SOAJ injection Inject 0.3 mLs (0.3 mg total) into the muscle as needed for anaphylaxis. 03/09/19   Breeback, Royetta Car, PA-C  folic acid (FOLVITE) 1 MG tablet .AKE 2 TABLETS (2 MG TOTAL) BY MOUTH DAILY. 11/30/19   Ofilia Neas, PA-C  furosemide (LASIX) 40 MG tablet Take 1 tablet (40 mg total) by mouth daily as needed for edema. Patient taking  differently: Take 40 mg by mouth daily.  04/11/20   Sherren Mocha, MD  hyoscyamine (LEVSIN) 0.125 MG tablet Take 1 tablet (0.125 mg total) by mouth 3 (three) times daily as needed. 03/03/20   Jonathon Bellows, MD  insulin lispro (HUMALOG) 100 UNIT/ML injection Inject 80-90 Units into the skin continuous. FOR USE IN INSULIN PUMP. TOTAL DAILY INSULIN DOSE = UP TO 90 UNITS. 03/24/15   [provider]  isosorbide mononitrate (IMDUR) 60 MG 24 hr tablet TAKE 1 TABLET (60 MG TOTAL) BY MOUTH DAILY. 03/15/20   Sherren Mocha, MD  Javier Docker Oil 500 MG CAPS Take 500 mg by mouth daily.    [provider]  leflunomide (ARAVA) 20 MG tablet TAKE 1 TABLET BY MOUTH DAILY 07/25/20   Ofilia Neas, PA-C  linaclotide [facility]) 290 MCG CAPS capsule Take 1 capsule (290 mcg total) by mouth daily before breakfast. 04/07/20   Jonathon Bellows, MD  losartan (COZAAR) 100 MG tablet Take 1 tablet (100 mg total) by mouth daily. 02/02/20   Sherren Mocha, MD  metoprolol tartrate (LOPRESSOR) 50 MG tablet TAKE 1 TABLET BY MOUTH TWICE DAILY 03/15/20   Sherren Mocha, MD  nitroGLYCERIN (NITROSTAT) 0.4 MG SL tablet Place 1 tablet (0.4 mg total) under the tongue every 5 (five) minutes as needed. 02/15/20 07/14/20  Verta Ellen., NP  omeprazole (PRILOSEC) 40 MG capsule  02/15/20   [provider]  ONE TOUCH ULTRA TEST test strip  11/20/16   [provider]  pantoprazole (PROTONIX) 40 MG tablet Take 1 tablet (40 mg total) by mouth daily. 02/15/20   Verta Ellen., NP  potassium chloride (KLOR-CON) 10 MEQ tablet Take 1 tablet (10 mEq total) by mouth daily. 04/11/20   Sherren Mocha, MD  promethazine (PHENERGAN) 25 MG tablet Take 1 tablet (25 mg total) by mouth every 6 (six) hours as needed for nausea or vomiting. 08/28/20   Gareth Morgan, MD  rosuvastatin (CRESTOR) 20 MG tablet Take 1 tablet (20 mg total) by mouth daily. 03/15/20   Sherren Mocha, MD  Upadacitinib ER (RINVOQ) 15 MG TB24 Take 15 mg by mouth daily.  08/24/20   Ofilia Neas, PA-C  Allergies    Actemra [tocilizumab], Prochlorperazine, Ramipril, Shellfish-derived products, Atorvastatin, Compazine  [prochlorperazine edisylate], Emgality [galcanezumab-gnlm], Etanercept, Infliximab, Iohexol, Orencia [abatacept], Prochlorperazine edisylate, Shellfish allergy, Tofacitinib, Trokendi xr [topiramate er], Amiodarone, and Rituximab  Review of Systems   Review of Systems  Constitutional: Negative for fever.  HENT: Negative for sore throat.   Eyes: Negative for visual disturbance.  Respiratory: Negative for cough and shortness of breath.   Cardiovascular: Negative for chest pain.  Gastrointestinal: Positive for abdominal pain, diarrhea, nausea and vomiting.  Genitourinary: Negative for difficulty urinating.  Musculoskeletal: Negative for back pain and neck pain.  Skin: Negative for rash.  Neurological: Negative for syncope and headaches.    Physical Exam Updated Vital Signs BP 133/60 (BP Location: Right Arm)   Pulse 97   Temp 98.7 F (37.1 C) (Oral)   Resp 16   Ht 5\' 3"  (1.6 m)   Wt 79.4 kg   SpO2 98%   BMI 31.00 kg/m   Physical Exam Vitals and nursing note reviewed.  Constitutional:      General: She is not in acute distress.    Appearance: She is well-developed. She is not diaphoretic.  HENT:     Head: Normocephalic and atraumatic.  Eyes:     Conjunctiva/sclera: Conjunctivae normal.  Cardiovascular:     Rate and Rhythm: Normal rate and regular rhythm.     Heart sounds: Normal heart sounds. No murmur heard.  No friction rub. No gallop.   Pulmonary:     Effort: Pulmonary effort is normal. No respiratory distress.     Breath sounds: Normal breath sounds. No wheezing or rales.  Abdominal:     General: There is no distension.     Palpations: Abdomen is soft.     Tenderness: There is no abdominal tenderness. There is no guarding.  Musculoskeletal:        General: No tenderness.     Cervical back: Normal range of motion.    Skin:    General: Skin is warm and dry.     Findings: No erythema or rash.  Neurological:     Mental Status: She is alert and oriented to person, place, and time.     ED Results / Procedures / Treatments   Labs (all labs ordered are listed, but only abnormal results are displayed) Labs Reviewed  CBC WITH DIFFERENTIAL/PLATELET - Abnormal; Notable for the following components:      Result Value   RBC 3.81 (*)    HCT 34.5 (*)    Neutro Abs 8.0 (*)    All other components within normal limits  URINALYSIS, ROUTINE W REFLEX MICROSCOPIC - Abnormal; Notable for the following components:   Ketones, ur 15 (*)    All other components within normal limits  COMPREHENSIVE METABOLIC PANEL - Abnormal; Notable for the following components:   Glucose, Bld 177 (*)    Calcium 8.7 (*)    All other components within normal limits  LIPASE, BLOOD    EKG None  Radiology CT Abdomen Pelvis Wo Contrast  Result Date: 08/28/2020 CLINICAL DATA:  RIGHT lower quadrant pain with nausea, vomiting and diarrhea for several days, no fever EXAM: CT ABDOMEN AND PELVIS WITHOUT CONTRAST TECHNIQUE: Multidetector CT imaging of the abdomen and pelvis was performed following the standard protocol without IV contrast. Sagittal and coronal MPR images reconstructed from axial data set. Patient drank dilute oral contrast for exam. COMPARISON:  02/29/2020 FINDINGS: Lower chest: Minimal subsegmental atelectasis in lingula and LEFT lower lobe. Questionable tiny lung  nodule 1-2 mm diameter RIGHT lower lobe image 3. Additional questionable tiny pulmonary nodule 3 mm diameter LEFT lower lobe image 11. Hepatobiliary: Gallbladder surgically absent. Liver normal appearance. Pancreas: Atrophic without mass Spleen: Normal appearance Adrenals/Urinary Tract: Adrenal glands, kidneys, ureters, and bladder normal appearance Stomach/Bowel: Normal appendix. Wall thickening of the ascending colon with infiltrative changes of adjacent pericolic fat.  This has progressed since the previous exam in both thickness and length, now 12 cm length. This could represent an ascending colonic neoplasm or less likely progressive colitis and requires colonoscopic assessment. No evidence of bowel obstruction. Remainder of colon normal appearance. Small bowel loops unremarkable. Stomach well distended without focal abnormality. Vascular/Lymphatic: Extensive atherosclerotic calcifications aorta, iliac arteries, visceral arteries, coronary arteries. Aorta normal caliber. No adenopathy. Reproductive: Uterus surgically absent. Nonvisualization of ovaries. Other: No free air or free fluid.  No hernia. Musculoskeletal: Demineralized. IMPRESSION: Wall thickening of the ascending colon with infiltrative changes of adjacent pericolic fat, progressive since the previous exam, could represent ascending colonic neoplasm or less likely progressive colitis; colonoscopy recommended to exclude colonic neoplasm. No evidence of bowel obstruction or perforation. Extensive atherosclerotic disease changes as above. Tiny bibasilar pulmonary nodules, 1-2 mm RIGHT lower lobe and 3 mm LEFT lower lobe; based on sizes, these do not require follow-up CT chest assessment unless colonoscopy confirms presence of a colonic neoplasm, at which time staging CT chest would be recommended. Findings called to Dr. Billy Fischer on 08/29 at 1137 hrs. Aortic Atherosclerosis (ICD10-I70.0). Electronically Signed   By: Lavonia Dana M.D.   On: 08/28/2020 11:38    Procedures Procedures (including critical care time)  Medications Ordered in ED Medications  ondansetron (ZOFRAN) injection 4 mg (4 mg Intravenous Given 08/28/20 0932)  sodium chloride 0.9 % bolus 1,000 mL ( Intravenous Stopped 08/28/20 1051)  morphine 4 MG/ML injection 4 mg (4 mg Intravenous Given 08/28/20 0935)    ED Course  I have reviewed the triage vital signs and the nursing notes.  Pertinent labs & imaging results that were available during my  care of the patient were reviewed by me and considered in my medical decision making (see chart for details).    MDM Rules/Calculators/A&P                           54 year old female with history of type 1 diabetes, coronary artery disease, hypothyroidism, hyperlipidemia, atrial fibrillation, cholecystectomy, hysterectomy, presents with concern for abdominal pain, nausea and vomiting.    DDx includes appendicitis, pancreatitis, gastroparesis, DKA, pyelonephritis, nephrolithiasis, diverticulitis, torsion.  CT shows wall thickening, adjacent pericolic fat infiltrative changes which are progressive since previous exam and could represent ascending colonic neoplasm or less likely progressive colitis.  Discussed pulmonary nodules also with pt reports she has had these followed by PCP.   She works with her GI physician in [place] and reports a colonoscopy was performed after last CT showed findings and he did not see abnormalities, however discussed given progressive findings today we recommend repeat Colonoscopy and discussion with her GI physician as there is concern for neoplasm. Will treat also for possible infectious colitis with azithromycin. Patient discharged in stable condition with understanding of reasons to return.   Final Clinical Impression(s) / ED Diagnoses Final diagnoses:  Colitis    Rx / DC Orders ED Discharge Orders         Ordered    azithromycin (ZITHROMAX) 250 MG tablet  Daily        08/28/20 1142  ondansetron (ZOFRAN ODT) 4 MG disintegrating tablet  Every 8 hours PRN,   Status:  Discontinued        08/28/20 1142    dicyclomine (BENTYL) 20 MG tablet  2 times daily PRN        08/28/20 1143    promethazine (PHENERGAN) 25 MG tablet  Every 6 hours PRN        08/28/20 1211           Gareth Morgan, MD 08/29/20 1014

## 2020-08-28 NOTE — ED Triage Notes (Signed)
Pt c/o right sided abdominal pain x 1-2 weeks with N/V.

## 2020-08-29 ENCOUNTER — Other Ambulatory Visit: Payer: Self-pay

## 2020-08-29 DIAGNOSIS — R9389 Abnormal findings on diagnostic imaging of other specified body structures: Secondary | ICD-10-CM

## 2020-08-29 DIAGNOSIS — R109 Unspecified abdominal pain: Secondary | ICD-10-CM

## 2020-09-04 ENCOUNTER — Encounter: Payer: Self-pay | Admitting: Internal Medicine

## 2020-09-05 ENCOUNTER — Telehealth: Payer: Self-pay | Admitting: Internal Medicine

## 2020-09-05 NOTE — Telephone Encounter (Signed)
Dr. Vicente Males Can you review ED visit 08/28/20 GI related issue and CT scan done as well and sch pt f/u   Thanks [place]  IMPRESSION: Wall thickening of the ascending colon with infiltrative changes of adjacent pericolic fat, progressive since the previous exam, could represent ascending colonic neoplasm or less likely progressive colitis; colonoscopy recommended to exclude colonic neoplasm.  No evidence of bowel obstruction or perforation.  Extensive atherosclerotic disease changes as above.  Tiny bibasilar pulmonary nodules, 1-2 mm RIGHT lower lobe and 3 mm LEFT lower lobe; based on sizes, these do not require follow-up CT chest assessment unless colonoscopy confirms presence of a colonic neoplasm, at which time staging CT chest would be recommended.  Findings called to Dr. Billy Fischer on 08/29 at 1137 hrs.  Aortic Atherosclerosis (ICD10-I70.0).

## 2020-09-06 NOTE — Telephone Encounter (Signed)
Thank you :)

## 2020-09-06 NOTE — Telephone Encounter (Signed)
Good morning   I have reviewed it and discussed with Dr Lucky Cowboy last week  who is going to see her to perform an angiogram of her mesenteric  vessels to rule out stenosis . I suspect she is having recurrent ischemia and colitis . I have discussed with Lupita and will follow up on her after vascular visit . We did a recent colonoscopy and it was normal .  Regards  Deshay Blumenfeld

## 2020-09-13 ENCOUNTER — Other Ambulatory Visit: Payer: Self-pay | Admitting: Gastroenterology

## 2020-09-13 ENCOUNTER — Other Ambulatory Visit: Payer: Self-pay | Admitting: Rheumatology

## 2020-09-13 ENCOUNTER — Ambulatory Visit: Payer: 59 | Admitting: Neurology

## 2020-09-13 MED ORDER — DICYCLOMINE HCL 20 MG PO TABS: 20.0000 mg | ORAL_TABLET | Freq: Three times a day (TID) | ORAL | 0 refills | Status: DC | PRN

## 2020-09-13 NOTE — Telephone Encounter (Signed)
Last Visit: 07/14/2020 Next Visit: 10/13/2020  Last Fill: 08/08/2020  Okay to refill Cyclobenzaprine?

## 2020-09-14 ENCOUNTER — Ambulatory Visit: Payer: 59 | Admitting: Internal Medicine

## 2020-09-14 ENCOUNTER — Encounter: Payer: Self-pay | Admitting: Internal Medicine

## 2020-09-14 ENCOUNTER — Other Ambulatory Visit: Payer: Self-pay

## 2020-09-14 VITALS — BP 110/62 | HR 59 | Temp 98.1°F | Ht 63.0 in | Wt 173.8 lb

## 2020-09-14 DIAGNOSIS — Z8639 Personal history of other endocrine, nutritional and metabolic disease: Secondary | ICD-10-CM

## 2020-09-14 DIAGNOSIS — E10319 Type 1 diabetes mellitus with unspecified diabetic retinopathy without macular edema: Secondary | ICD-10-CM

## 2020-09-14 DIAGNOSIS — E669 Obesity, unspecified: Secondary | ICD-10-CM | POA: Insufficient documentation

## 2020-09-14 DIAGNOSIS — E039 Hypothyroidism, unspecified: Secondary | ICD-10-CM

## 2020-09-14 DIAGNOSIS — R110 Nausea: Secondary | ICD-10-CM | POA: Diagnosis not present

## 2020-09-14 DIAGNOSIS — E1159 Type 2 diabetes mellitus with other circulatory complications: Secondary | ICD-10-CM | POA: Diagnosis not present

## 2020-09-14 DIAGNOSIS — I152 Hypertension secondary to endocrine disorders: Secondary | ICD-10-CM | POA: Insufficient documentation

## 2020-09-14 DIAGNOSIS — Z1231 Encounter for screening mammogram for malignant neoplasm of breast: Secondary | ICD-10-CM | POA: Diagnosis not present

## 2020-09-14 DIAGNOSIS — Z23 Encounter for immunization: Secondary | ICD-10-CM

## 2020-09-14 DIAGNOSIS — Z0000 Encounter for general adult medical examination without abnormal findings: Secondary | ICD-10-CM | POA: Insufficient documentation

## 2020-09-14 DIAGNOSIS — I700 Atherosclerosis of aorta: Secondary | ICD-10-CM | POA: Insufficient documentation

## 2020-09-14 DIAGNOSIS — I10 Essential (primary) hypertension: Secondary | ICD-10-CM

## 2020-09-14 DIAGNOSIS — E66811 Obesity, class 1: Secondary | ICD-10-CM | POA: Insufficient documentation

## 2020-09-14 MED ORDER — PROMETHAZINE HCL 25 MG PO TABS: 12.5000 mg | ORAL_TABLET | Freq: Two times a day (BID) | ORAL | 2 refills | Status: DC | PRN

## 2020-09-14 NOTE — Progress Notes (Signed)
Pre visit review using our clinic review tool, if applicable. No additional management support is needed unless otherwise documented below in the visit note. 

## 2020-09-14 NOTE — Patient Instructions (Signed)
Zoster Vaccine, Recombinant injection What is this medicine? ZOSTER VACCINE (ZOS ter vak SEEN) is used to prevent shingles in adults 53 years old and over. This vaccine is not used to treat shingles or nerve pain from shingles. This medicine may be used for other purposes; ask your health care provider or pharmacist if you have questions. COMMON BRAND NAME(S): [facility] What should I tell my health care provider before I take this medicine? They need to know if you have any of these conditions:  blood disorders or disease  cancer like leukemia or lymphoma  immune system problems or therapy  an unusual or allergic reaction to vaccines, other medications, foods, dyes, or preservatives  pregnant or trying to get pregnant  breast-feeding How should I use this medicine? This vaccine is for injection in a muscle. It is given by a health care professional. Talk to your pediatrician regarding the use of this medicine in children. This medicine is not approved for use in children. Overdosage: If you think you have taken too much of this medicine contact a poison control center or emergency room at once. NOTE: This medicine is only for you. Do not share this medicine with others. What if I miss a dose? Keep appointments for follow-up (booster) doses as directed. It is important not to miss your dose. Call your doctor or health care professional if you are unable to keep an appointment. What may interact with this medicine?  medicines that suppress your immune system  medicines to treat cancer  steroid medicines like prednisone or cortisone This list may not describe all possible interactions. Give your health care provider a list of all the medicines, herbs, non-prescription drugs, or dietary supplements you use. Also tell them if you smoke, drink alcohol, or use illegal drugs. Some items may interact with your medicine. What should I watch for while using this medicine? Visit your doctor for  regular check ups. This vaccine, like all vaccines, may not fully protect everyone. What side effects may I notice from receiving this medicine? Side effects that you should report to your doctor or health care professional as soon as possible:  allergic reactions like skin rash, itching or hives, swelling of the face, lips, or tongue  breathing problems Side effects that usually do not require medical attention (report these to your doctor or health care professional if they continue or are bothersome):  chills  headache  fever  nausea, vomiting  redness, warmth, pain, swelling or itching at site where injected  tiredness This list may not describe all possible side effects. Call your doctor for medical advice about side effects. You may report side effects to FDA at 1-800-FDA-1088. Where should I keep my medicine? This vaccine is only given in a clinic, pharmacy, doctor's office, or other health care setting and will not be stored at home. NOTE: This sheet is a summary. It may not cover all possible information. If you have questions about this medicine, talk to your doctor, pharmacist, or health care provider.  2020 Elsevier/Gold Standard (2017-07-29 13:20:30)  Chronic Mesenteric Ischemia  Chronic mesenteric ischemia is poor blood flow (circulation) in the vessels that supply blood to the stomach, intestines, and liver (mesenteric organs). When the blood supply is severely restricted, these organs cannot work properly. This condition is also called mesenteric angina, or intestinal angina. This condition is a long-term (chronic) condition. It happens when an artery or vein that provides blood to the mesenteric organs gradually becomes blocked or narrows over time,  restricting the blood supply to these organs. What are the causes? This condition is commonly caused by fatty deposits that build up in an artery (plaque), which can narrow the artery and restrict blood flow. Other causes  include:  Weakened areas in blood vessel walls (aneurysms).  Conditions that cause twisting or inflammation of blood vessels, such as fibromuscular dysplasia or arteritis.  A disorder in which blood clots form in the veins (venous thrombosis).  Scarring and thickening (fibrosis) of blood vessels caused by radiation therapy.  A tear in the aorta, the body's main artery (aortic dissection).  Blood vessel problems after illegal drug use, such as use of cocaine.  Tumors in the nervous system (neurofibromatosis).  Certain autoimmune diseases, such as lupus. What increases the risk? The following factors may make you more likely to develop this condition:  Being female.  Being over age 71, especially if you have a history of heart problems.  Smoking.  Having congestive heart failure.  Having an irregular heartbeat (arrhythmia).  Having a history of heart attack or stroke.  Having diabetes.  Having high cholesterol.  Having high blood pressure (hypertension).  Being overweight or obese.  Having kidney disease (renal disease) that requires dialysis. What are the signs or symptoms? Symptoms of this condition include:  Pain or cramps in the abdomen that develop 15-60 minutes after a meal. This pain may last for 1-3 hours. Some people may develop a fear of eating because of this symptom.  Weight loss.  Diarrhea.  Bloody stool.  Nausea.  Vomiting.  Bloating.  Abdominal pain after stress or with exercise. How is this diagnosed? This condition is diagnosed based on:  Your medical history.  A physical exam.  Tests, such as: ? Ultrasound. ? CT scan. ? Blood tests. ? Urine tests. ? An imaging test that involves injecting a dye into your arteries to show blood flow through blood vessels (angiogram). This can help to show if there are any blockages in the vessels that lead to the intestines. ? Passing a small probe through the mouth and into the stomach to measure  the output of carbon dioxide (gastric tonometry). This can help to indicate whether there is decreased blood flow to the stomach and intestines. How is this treated? This condition may be treated with:  Dietary changes such as eating smaller, low-fat, meals more frequently.  Lifestyle changes to treat underlying conditions that contribute to the disease, such as high cholesterol and high blood pressure.  Medicines to reduce blood clotting and increase blood flow.  Surgery to remove the blockage, repair arteries or veins, and restore blood flow. This may involve: ? Angioplasty. This is surgery to widen the affected artery, reduce the blockage, and sometimes insert a small, mesh tube (stent). ? Bypass surgery. This may be done to go around (bypass) the blockage and reconnect healthy arteries or veins. ? Placing a stent in the affected area. This may be done to help keep blocked arteries open. Follow these instructions at home: Eating and drinking   Eat a heart-healthy diet. This includes fresh fruits and vegetables, whole grains, and lean proteins like chicken, fish, and beans.  Avoid foods that contain a lot of: ? Salt (sodium). ? Sugar. ? Saturated fat (such as red meat). ? Trans fat (such as in fried foods).  Stay hydrated. Drink enough fluid to keep your urine pale yellow. Lifestyle  Stay active and get regular exercise as told by your health care provider. Aim for 150 minutes of  moderate activity or 75 minutes of vigorous activity a week. Ask your health care provider what activities and forms of exercise are safe for you.  Maintain a healthy weight.  Work with your health care provider to manage your cholesterol.  Manage any other health problems you have, such as high blood pressure, diabetes, or heart rhythm problems.  Do not use any products that contain nicotine or tobacco, such as cigarettes, e-cigarettes, and chewing tobacco. If you need help quitting, ask your health  care provider. General instructions  Take over-the-counter and prescription medicines only as told by your health care provider.  Keep all follow-up visits as told by your health care provider. This is important.  You may need to take actions to prevent or treat constipation, such as: ? Drink enough fluid to keep your urine pale yellow. ? Take over-the-counter or prescription medicines. ? Eat foods that are high in fiber, such as beans, whole grains, and fresh fruits and vegetables. ? Limit foods that are high in fat and processed sugars, such as fried or sweet foods. Contact a health care provider if:  Your symptoms do not improve or they return after treatment.  You have a fever.  You are constipated. Get help right away if you:  Have severe abdominal pain.  Have severe chest pain.  Have shortness of breath.  Feel weak or dizzy.  Have fast or irregular heartbeats (palpitations).  Have numbness or weakness in your face, arm, or leg.  Are confused.  Have trouble speaking or people have trouble understanding what you are saying.  Have trouble urinating.  Have blood in your stool.  Have severe nausea, vomiting, or persistent diarrhea. These symptoms may represent a serious problem that is an emergency. Do not wait to see if the symptoms will go away. Get medical help right away. Call your local emergency services (911 in the [place] Do not drive yourself to the hospital. Summary  Mesenteric ischemia is poor circulation in the vessels that supply blood to the the stomach, intestines, and liver (mesenteric organs).  This condition happens when an artery or vein that provides blood to the mesenteric organs gradually becomes blocked or narrow, restricting the blood supply to the organs.  This condition is commonly caused by fatty deposits that build up in an artery (plaque), which can narrow the artery and restrict blood flow.  You are more likely to develop this condition  if you are over age 69 and have a history of heart problems, high blood pressure, diabetes, or high cholesterol.  This condition is usually treated with medicines, dietary and lifestyle changes, and surgery to remove the blockage, repair arteries or veins, and restore blood flow. This information is not intended to replace advice given to you by your health care provider. Make sure you discuss any questions you have with your health care provider. Document Revised: 08/22/2018 Document Reviewed: 08/22/2018 Elsevier Patient Education  2020 [employer].

## 2020-09-14 NOTE — Progress Notes (Signed)
Chief Complaint  Patient presents with  . Follow-up   Annual   1. C/w mesenteric ischemia pt having 10/10 ab pain 08/28/20 worse after eating food 08/27/20 and pain RUQ, RMQ and RLQ and CT c/w severe atherosclerosis multiple locations pain today 3/10 appt with vascular surgery for further imaging  She is on crestor 20 mg qhs  She has associated nausea at times esp with lunch and needs refill phenerghan  2. Wants shingrix shot today  3. Hypothyroidism controlled f/u endocrine TSH 1.14 and free T4 1.3 08/22/20 h/o graves  4. DM 1 A1C controlled 6.5 08/22/20 has insulin pump per endocrine last A1C 04/2020 6.1 5. htn controlled on norvasc 5 mg qd lasix 40. Losartan 100 mg qd    Review of Systems  Constitutional: Negative for weight loss.  HENT: Negative for hearing loss.   Eyes: Negative for blurred vision.  Respiratory: Negative for shortness of breath.   Cardiovascular: Negative for chest pain.  Gastrointestinal: Positive for abdominal pain and nausea.  Musculoskeletal: Negative for falls.  Skin: Negative for rash.  Neurological: Negative for headaches.  Psychiatric/Behavioral: Negative for depression.   Past Medical History:  Diagnosis Date  . ACUT MI ANTEROLAT WALL SUBSQT EPIS CARE   . Acute maxillary sinusitis   . ALLERGIC RHINITIS, SEASONAL   . ARTHRITIS, RHEUMATOID    shoulders and hands Enbrel>leg swelling Dr. Estanislado Pandy  . Atrial fibrillation ([place])    a. after CABG.  . Back pain   . Bruxism (teeth grinding)   . CAD, ARTERY BYPASS GRAFT    a. DES to RCA in 2010 then LAD occlusion s/p CABG 3 06/07/2009 with LIMA to LAD, reverse SVG to D1, reverse SVG to distal RCA. b. Cath 05/08/2016 slightly hypodense region in the intermediate branch, however she had excellent flow, FFR was normal. Vein graft to PDA and the posterolateral branch is patent, patent LIMA to LAD, occluded SVG to diagonal.  . CARPAL TUNNEL SYNDROME, BILATERAL   . CHOLELITHIASIS   . Contrast media allergy   .  DERMATITIS, ALLERGIC   . DIABETES MELLITUS, TYPE I    on insulin pump dx'ed age 48 y.o   . DIABETIC  RETINOPATHY   . Hiatal hernia   . HYPERLIPIDEMIA-MIXED   . HYPERTHYROIDISM    Dr. Dollene Cleveland  . Insulin pump in place   . MIGRAINE W/O AURA W/INTRACT W/STATUS MIGRAINOSUS 02/19/2008  . Psoriasis   . SINUS TACHYCARDIA 11/08/2010  . SVT (supraventricular tachycardia) (HCC)    after s/p CABG  . TRIGGER FINGER    all fingers b/l hands   . URI    Past Surgical History:  Procedure Laterality Date  . ABDOMINAL HYSTERECTOMY     endometriomas b/l; total ? cervix removed; no h/o abnormal pap  . Caesarean section    . CARDIAC CATHETERIZATION N/A 05/08/2016   Procedure: Left Heart Cath and Cors/Grafts Angiography;  Surgeon: Burnell Blanks, MD;  Location: [facility];  Service: Cardiovascular;  Laterality: N/A;  . CARPAL TUNNEL RELEASE     b/l   . CATARACT EXTRACTION, BILATERAL    . CHOLECYSTECTOMY    . COLONOSCOPY WITH PROPOFOL N/A 03/25/2020   Procedure: COLONOSCOPY WITH PROPOFOL;  Surgeon: Jonathon Bellows, MD;  Location: [facility] ENDOSCOPY;  Service: Gastroenterology;  Laterality: N/A;  . CORONARY ARTERY BYPASS GRAFT    . ESOPHAGOGASTRODUODENOSCOPY (EGD) WITH PROPOFOL N/A 03/25/2020   Procedure: ESOPHAGOGASTRODUODENOSCOPY (EGD) WITH PROPOFOL;  Surgeon: Jonathon Bellows, MD;  Location: [facility] ENDOSCOPY;  Service: Gastroenterology;  Laterality: N/A;  . EYE SURGERY     laser x 2 for retinopathy   . LEFT HEART CATH AND CORS/GRAFTS ANGIOGRAPHY N/A 02/22/2020   Procedure: LEFT HEART CATH AND CORS/GRAFTS ANGIOGRAPHY;  Surgeon: Burnell Blanks, MD;  Location: [facility];  Service: Cardiovascular;  Laterality: N/A;  . TRIGGER FINGER RELEASE     b/l fingers all   . VITRECTOMY     b/l    Family History  Problem Relation Age of Onset  . Depression Mother   . Anxiety disorder Mother   . Colon polyps Father   . Diabetes Father        2  . Hypertension Father   . Parkinson's  disease Father   . Diabetes Other   . Heart disease Other   . Hypertension Other   . Hyperlipidemia Other   . Depression Other   . Migraines Other   . Stroke Paternal Grandmother   . Heart attack Neg Hx   . Colon cancer Neg Hx   . Stomach cancer Neg Hx   . Esophageal cancer Neg Hx    Social History   Socioeconomic History  . Marital status: Divorced    Spouse name: Not on file  . Number of children: Not on file  . Years of education: Not on file  . Highest education level: Not on file  Occupational History  . Not on file  Tobacco Use  . Smoking status: Never Smoker  . Smokeless tobacco: Never Used  Vaping Use  . Vaping Use: Never used  Substance and Sexual Activity  . Alcohol use: Yes    Comment: rarely 1 every 6 months  . Drug use: No  . Sexual activity: Not Currently    Partners: Male    Birth control/protection: None  Other Topics Concern  . Not on file  Social History Narrative   Divorced. Has 2 kids(fraternal twins) daughters age 14. Works at [employer], Never smoked, denies ETOH, no drugs. Drinks diet coke. No exercise.       DPR daughters [name] and Merial Moritz twins          Social Determinants of Health   Financial Resource Strain:   . Difficulty of Paying Living Expenses: Not on file  Food Insecurity:   . Worried About Charity fundraiser in the Last Year: Not on file  . Ran Out of Food in the Last Year: Not on file  Transportation Needs:   . Lack of Transportation (Medical): Not on file  . Lack of Transportation (Non-Medical): Not on file  Physical Activity:   . Days of Exercise per Week: Not on file  . Minutes of Exercise per Session: Not on file  Stress:   . Feeling of Stress : Not on file  Social Connections:   . Frequency of Communication with Friends and Family: Not on file  . Frequency of Social Gatherings with Friends and Family: Not on file  . Attends Religious Services: Not on file  . Active Member of Clubs or  Organizations: Not on file  . Attends Archivist Meetings: Not on file  . Marital Status: Not on file  Intimate Partner Violence:   . Fear of Current or Ex-Partner: Not on file  . Emotionally Abused: Not on file  . Physically Abused: Not on file  . Sexually Abused: Not on file   Current Meds  Medication Sig  . amLODipine (NORVASC) 5 MG tablet Take 0.5 tablets (2.5  mg total) by mouth daily.  Marland Kitchen aspirin EC 81 MG tablet Take 81 mg by mouth daily.  . CHELATED MAGNESIUM PO Take 250 mg by mouth daily.  . clopidogrel (PLAVIX) 75 MG tablet TAKE 1 TABLET BY MOUTH ONCE DAILY  . cyclobenzaprine (FLEXERIL) 10 MG tablet TAKE 1 TABLET BY MOUTH AT BEDTIME  . dicyclomine (BENTYL) 20 MG tablet Take 1 tablet (20 mg total) by mouth 3 (three) times daily as needed for spasms (abdominal pain).  Marland Kitchen EPINEPHrine 0.3 mg/0.3 mL IJ SOAJ injection Inject 0.3 mLs (0.3 mg total) into the muscle as needed for anaphylaxis.  . folic acid (FOLVITE) 1 MG tablet .AKE 2 TABLETS (2 MG TOTAL) BY MOUTH DAILY.  . furosemide (LASIX) 40 MG tablet Take 1 tablet (40 mg total) by mouth daily as needed for edema. (Patient taking differently: Take 40 mg by mouth daily. )  . insulin lispro (HUMALOG) 100 UNIT/ML injection Inject 80-90 Units into the skin continuous. FOR USE IN INSULIN PUMP. TOTAL DAILY INSULIN DOSE = UP TO 90 UNITS.  Marland Kitchen isosorbide mononitrate (IMDUR) 60 MG 24 hr tablet TAKE 1 TABLET (60 MG TOTAL) BY MOUTH DAILY.  Marland Kitchen Krill Oil 500 MG CAPS Take 500 mg by mouth daily.  Marland Kitchen leflunomide (ARAVA) 20 MG tablet TAKE 1 TABLET BY MOUTH DAILY  . linaclotide (LINZESS) 290 MCG CAPS capsule Take 1 capsule (290 mcg total) by mouth daily before breakfast.  . losartan (COZAAR) 100 MG tablet Take 1 tablet (100 mg total) by mouth daily.  . metoprolol tartrate (LOPRESSOR) 50 MG tablet TAKE 1 TABLET BY MOUTH TWICE DAILY  . omeprazole (PRILOSEC) 40 MG capsule   . ONE TOUCH ULTRA TEST test strip   . pantoprazole (PROTONIX) 40 MG tablet  Take 1 tablet (40 mg total) by mouth daily.  . potassium chloride (KLOR-CON) 10 MEQ tablet Take 1 tablet (10 mEq total) by mouth daily.  . promethazine (PHENERGAN) 25 MG tablet Take 0.5-1 tablets (12.5-25 mg total) by mouth 2 (two) times daily as needed for nausea or vomiting.  . rosuvastatin (CRESTOR) 20 MG tablet Take 1 tablet (20 mg total) by mouth daily.  Marland Kitchen Upadacitinib ER (RINVOQ) 15 MG TB24 Take 15 mg by mouth daily.  . [DISCONTINUED] promethazine (PHENERGAN) 25 MG tablet Take 1 tablet (25 mg total) by mouth every 6 (six) hours as needed for nausea or vomiting.   Allergies  Allergen Reactions  . Actemra [Tocilizumab]   . Prochlorperazine Rash    Neuro problems per pt  . Ramipril Swelling, Rash and Other (See Comments)  . Shellfish-Derived Products Swelling    Shrimp   . Atorvastatin Rash    Elevated LFT's  . Compazine  [Prochlorperazine Edisylate] Other (See Comments)    Neurological reaction  . Emgality [Galcanezumab-Gnlm] Hives and Swelling  . Etanercept Swelling and Rash  . Infliximab Rash  . Iohexol     Iv contrast dye -rash all over  . Orencia [Abatacept] Rash  . Prochlorperazine Edisylate     unknown  . Shellfish Allergy Swelling    Shrimp(Facial swelling)  . Tofacitinib Rash and Other (See Comments)    Severe abdominal pain   . [employer]]     [employer], memory issues, word finding issues  . Amiodarone Nausea Only  . Rituximab Rash    Causes a rash   Recent Results (from the past 2160 hour(s))  CBC with Differential/Platelet     Status: None   Collection Time: 07/14/20  9:06 AM  Result Value Ref Range   WBC 5.3 3.8 - 10.8 Thousand/uL   RBC 4.44 3.80 - 5.10 Million/uL   Hemoglobin 13.3 11.7 - 15.5 g/dL   HCT 39.6 35 - 45 %   MCV 89.2 80.0 - 100.0 fL   MCH 30.0 27.0 - 33.0 pg   MCHC 33.6 32.0 - 36.0 g/dL   RDW 12.1 11.0 - 15.0 %   Platelets 213 140 - 400 Thousand/uL   MPV 10.0 7.5 - 12.5 fL   Neutro Abs 1,701 1,500 - 7,800 cells/uL    Lymphs Abs 2,873 850 - 3,900 cells/uL   Absolute Monocytes 551 200 - 950 cells/uL   Eosinophils Absolute 133 15 - 500 cells/uL   Basophils Absolute 42 0 - 200 cells/uL   Neutrophils Relative % 32.1 %   Total Lymphocyte 54.2 %   Monocytes Relative 10.4 %   Eosinophils Relative 2.5 %   Basophils Relative 0.8 %  COMPLETE METABOLIC PANEL WITH GFR     Status: Abnormal   Collection Time: 07/14/20  9:06 AM  Result Value Ref Range   Glucose, Bld 107 (H) 65 - 99 mg/dL    Comment: .            Fasting reference interval . For someone without known diabetes, a glucose value between 100 and 125 mg/dL is consistent with prediabetes and should be confirmed with a follow-up test. .    BUN 17 7 - 25 mg/dL   Creat 0.99 0.50 - 1.05 mg/dL    Comment: For patients >66 years of age, the reference limit for Creatinine is approximately 13% higher for people identified as African-American. .    GFR, Est Non African American 66 > OR = 60 mL/min/1.61m   GFR, Est African American 76 > OR = 60 mL/min/1.762m  BUN/Creatinine Ratio NOT APPLICABLE 6 - 22 (calc)   Sodium 139 135 - 146 mmol/L   Potassium 3.6 3.5 - 5.3 mmol/L   Chloride 99 98 - 110 mmol/L   CO2 30 20 - 32 mmol/L   Calcium 9.4 8.6 - 10.4 mg/dL   Total Protein 6.5 6.1 - 8.1 g/dL   Albumin 4.6 3.6 - 5.1 g/dL   Globulin 1.9 1.9 - 3.7 g/dL (calc)   AG Ratio 2.4 1.0 - 2.5 (calc)   Total Bilirubin 0.5 0.2 - 1.2 mg/dL   Alkaline phosphatase (APISO) 91 37 - 153 U/L   AST 34 10 - 35 U/L   ALT 31 (H) 6 - 29 U/L  HM DIABETES EYE EXAM     Status: Abnormal   Collection Time: 08/11/20 12:00 AM  Result Value Ref Range   HM Diabetic Eye Exam Retinopathy (A) No Retinopathy    Comment: dm1 with prolif ret. OU mac thinning/?glaucoma/brightwood  CBC with Differential     Status: Abnormal   Collection Time: 08/28/20  9:14 AM  Result Value Ref Range   WBC 9.7 4.0 - 10.5 K/uL   RBC 3.81 (L) 3.87 - 5.11 MIL/uL   Hemoglobin 12.0 12.0 - 15.0 g/dL   HCT  34.5 (L) 36 - 46 %   MCV 90.6 80.0 - 100.0 fL   MCH 31.5 26.0 - 34.0 pg   MCHC 34.8 30.0 - 36.0 g/dL   RDW 12.3 11.5 - 15.5 %   Platelets 160 150 - 400 K/uL   nRBC 0.0 0.0 - 0.2 %   Neutrophils Relative % 84 %   Neutro Abs 8.0 (H) 1.7 - 7.7 K/uL  Lymphocytes Relative 9 %   Lymphs Abs 0.9 0.7 - 4.0 K/uL   Monocytes Relative 6 %   Monocytes Absolute 0.6 0 - 1 K/uL   Eosinophils Relative 0 %   Eosinophils Absolute 0.0 0 - 0 K/uL   Basophils Relative 0 %   Basophils Absolute 0.0 0 - 0 K/uL   Immature Granulocytes 1 %   Abs Immature Granulocytes 0.05 0.00 - 0.07 K/uL    Comment: Performed at [facility], [address] [place], [place] [address]  Urinalysis, Routine w reflex microscopic     Status: Abnormal   Collection Time: 08/28/20  9:40 AM  Result Value Ref Range   Color, Urine YELLOW YELLOW   APPearance CLEAR CLEAR   Specific Gravity, Urine 1.025 1.005 - 1.030   pH 5.0 5.0 - 8.0   Glucose, UA NEGATIVE NEGATIVE mg/dL   Hgb urine dipstick NEGATIVE NEGATIVE   Bilirubin Urine NEGATIVE NEGATIVE   Ketones, ur 15 (A) NEGATIVE mg/dL   Protein, ur NEGATIVE NEGATIVE mg/dL   Nitrite NEGATIVE NEGATIVE   Leukocytes,Ua NEGATIVE NEGATIVE    Comment: Microscopic not done on urines with negative protein, blood, leukocytes, nitrite, or glucose < 500 mg/dL. Performed at [facility], [address] [place], [place] [address]   Comprehensive metabolic panel     Status: Abnormal   Collection Time: 08/28/20  9:55 AM  Result Value Ref Range   Sodium 138 135 - 145 mmol/L   Potassium 3.7 3.5 - 5.1 mmol/L   Chloride 103 98 - 111 mmol/L   CO2 24 22 - 32 mmol/L   Glucose, Bld 177 (H) 70 - 99 mg/dL    Comment: Glucose reference range applies only to samples taken after fasting for at least 8 hours.   BUN 19 6 - 20 mg/dL   Creatinine, Ser 0.88 0.44 - 1.00 mg/dL   Calcium 8.7 (L) 8.9 - 10.3 mg/dL   Total Protein 6.6 6.5 - 8.1 g/dL   Albumin 4.2 3.5 - 5.0 g/dL   AST 31 15  - 41 U/L   ALT 24 0 - 44 U/L   Alkaline Phosphatase 65 38 - 126 U/L   Total Bilirubin 0.7 0.3 - 1.2 mg/dL   GFR calc non Af Amer >60 >60 mL/min   GFR calc Af Amer >60 >60 mL/min   Anion gap 11 5 - 15    Comment: Performed at [facility], [address] [place], [place] [address]  Lipase, blood     Status: None   Collection Time: 08/28/20  9:55 AM  Result Value Ref Range   Lipase 18 11 - 51 U/L    Comment: Performed at [facility], [address] [place], [place] [address]   Objective  Body mass index is 30.79 kg/m. Wt Readings from Last 3 Encounters:  09/14/20 173 lb 12.8 oz (78.8 kg)  08/28/20 175 lb (79.4 kg)  07/14/20 172 lb 9.6 oz (78.3 kg)   Temp Readings from Last 3 Encounters:  09/14/20 98.1 F (36.7 C) (Oral)  08/28/20 98.7 F (37.1 C) (Oral)  05/12/20 (!) 97.5 F (36.4 C)   BP Readings from Last 3 Encounters:  09/14/20 110/62  08/28/20 133/60  07/14/20 126/78   Pulse Readings from Last 3 Encounters:  09/14/20 (!) 59  08/28/20 97  07/14/20 68    Physical Exam Vitals and nursing note reviewed.  Constitutional:      Appearance: Normal  appearance. She is well-developed and well-groomed. She is obese.  HENT:     Head: Normocephalic and atraumatic.  Eyes:     Conjunctiva/sclera: Conjunctivae normal.     Pupils: Pupils are equal, round, and reactive to light.  Cardiovascular:     Rate and Rhythm: Normal rate and regular rhythm.     Heart sounds: Normal heart sounds. No murmur heard.   Pulmonary:     Effort: Pulmonary effort is normal.     Breath sounds: Normal breath sounds.  Abdominal:     Tenderness: There is abdominal tenderness in the right upper quadrant and right lower quadrant. There is no guarding or rebound.     Comments: Right sided ab pain mild   Skin:    General: Skin is warm and dry.  Neurological:     General: No focal deficit present.     Mental Status: She is alert and oriented to person, place, and time.  Mental status is at baseline.     Gait: Gait normal.  Psychiatric:        Attention and Perception: Attention and perception normal.        Mood and Affect: Mood and affect normal.        Speech: Speech normal.        Behavior: Behavior normal. Behavior is cooperative.        Thought Content: Thought content normal.        Cognition and Memory: Cognition and memory normal.        Judgment: Judgment normal.     Assessment  Plan  Annual physical exam  Nausea - Plan: promethazine (PHENERGAN) 12.5-25 MG tablet bid prn  Hypertension associated with diabetes (HCC) Cont meds controlled on norvasc 5 mg qd lasix 40 Imdur 60 mg qd  Losartan 100 mg qd   Hypothyroidism, unspecified type F/u Endocrine labs 08/22/20 h/o Graves  Controlled type 1 diabetes mellitus with retinopathy of both eyes, macular edema presence unspecified, unspecified retinopathy severity ([place]) Had DM1 since age 47 y.o F/u endocrine A1C 6.5 08/22/20  Fu Dr. Zigmund Daniel   Obesity (BMI 30-39.9) rec healthy diet and exercise   Severe atherosclerosis in multiple BVs  F/u Dr. Lucky Cowboy upcoming further tx plan ? If will need anticoagulation I.e xarelto or resection of bowel with surgery based on further testing Per pt 08/28/20 Dr. Vicente Males not c/w neoplasm since had 2 colonoscopies and thinks abdominal pain related mesenteric ischemia   HM  Flu shotutd pna 23utd Tdap utd covid vx had 2/2disc booster in 6 months due to immunocompromised shingrix 1/2 due in 2-6 months 2nd dose  mammo 11/11/19 negative ordered DEXA 11/11/19 normal  Colonoscopy 08/30/17 normal Dr. Memory Dance colitis, 2021 established Dr. Vicente Males colonoscopy EGD sch 03/25/20  Pap no longer needed  S/p hysterectomy no h/o abnormal  Endometriosis total hysterectomy   SPEP abnormal consider nephrotic proteinuria consider renal in future 10/15/2019 no abnormal bands but low urine creatinine and protein  End[facility] Dr. Steffanie Dunn appt 02/19/20 due to f/u 07/2020   Cards Dr. Gerrit Halls seen 03/10/20  [facility]-migraines  Dermatology Dr. Laurence Ferrari saw 12/2019 Rheumatology Dr. Estanislado Pandy  Hand surgery Dr. Fredna Dow Provider: Dr. Olivia Mackie McLean-Scocuzza-Internal Medicine

## 2020-09-20 MED FILL — RINVOQ 15 MG TB24: 15 | 30 days supply | Qty: 30 | Fill #1

## 2020-09-21 ENCOUNTER — Other Ambulatory Visit: Payer: Self-pay | Admitting: Pharmacist

## 2020-09-21 DIAGNOSIS — M0609 Rheumatoid arthritis without rheumatoid factor, multiple sites: Secondary | ICD-10-CM

## 2020-09-21 MED ORDER — RINVOQ 15 MG PO TB24: 15.0000 mg | ORAL_TABLET | Freq: Every day | ORAL | 0 refills | Status: DC

## 2020-09-23 ENCOUNTER — Other Ambulatory Visit (INDEPENDENT_AMBULATORY_CARE_PROVIDER_SITE_OTHER): Payer: Self-pay | Admitting: Vascular Surgery

## 2020-09-23 DIAGNOSIS — R109 Unspecified abdominal pain: Secondary | ICD-10-CM

## 2020-09-23 DIAGNOSIS — R9389 Abnormal findings on diagnostic imaging of other specified body structures: Secondary | ICD-10-CM

## 2020-09-27 ENCOUNTER — Ambulatory Visit (INDEPENDENT_AMBULATORY_CARE_PROVIDER_SITE_OTHER): Payer: 59

## 2020-09-27 ENCOUNTER — Ambulatory Visit (INDEPENDENT_AMBULATORY_CARE_PROVIDER_SITE_OTHER): Payer: 59 | Admitting: Vascular Surgery

## 2020-09-27 ENCOUNTER — Encounter (INDEPENDENT_AMBULATORY_CARE_PROVIDER_SITE_OTHER): Payer: Self-pay | Admitting: Vascular Surgery

## 2020-09-27 ENCOUNTER — Other Ambulatory Visit: Payer: Self-pay

## 2020-09-27 VITALS — BP 129/74 | HR 61 | Ht 63.0 in | Wt 174.0 lb

## 2020-09-27 DIAGNOSIS — K529 Noninfective gastroenteritis and colitis, unspecified: Secondary | ICD-10-CM | POA: Diagnosis not present

## 2020-09-27 DIAGNOSIS — I25119 Atherosclerotic heart disease of native coronary artery with unspecified angina pectoris: Secondary | ICD-10-CM

## 2020-09-27 DIAGNOSIS — R9389 Abnormal findings on diagnostic imaging of other specified body structures: Secondary | ICD-10-CM | POA: Diagnosis not present

## 2020-09-27 DIAGNOSIS — E11319 Type 2 diabetes mellitus with unspecified diabetic retinopathy without macular edema: Secondary | ICD-10-CM | POA: Insufficient documentation

## 2020-09-27 DIAGNOSIS — R109 Unspecified abdominal pain: Secondary | ICD-10-CM | POA: Diagnosis not present

## 2020-09-27 DIAGNOSIS — I2510 Atherosclerotic heart disease of native coronary artery without angina pectoris: Secondary | ICD-10-CM | POA: Insufficient documentation

## 2020-09-27 DIAGNOSIS — E10319 Type 1 diabetes mellitus with unspecified diabetic retinopathy without macular edema: Secondary | ICD-10-CM

## 2020-09-27 DIAGNOSIS — E0500 Thyrotoxicosis with diffuse goiter without thyrotoxic crisis or storm: Secondary | ICD-10-CM | POA: Insufficient documentation

## 2020-09-27 DIAGNOSIS — I10 Essential (primary) hypertension: Secondary | ICD-10-CM | POA: Diagnosis not present

## 2020-09-27 NOTE — Assessment & Plan Note (Signed)
blood glucose control important in reducing the progression of atherosclerotic disease. Also, involved in wound healing. On appropriate medications.  

## 2020-09-27 NOTE — Assessment & Plan Note (Signed)
S/P CABG

## 2020-09-27 NOTE — Progress Notes (Signed)
Patient ID: [name], female   DOB: [birth year], 54 y.o.   MRN: [id number]  Chief Complaint  Patient presents with  . New Patient (Initial Visit)    Mclean-scocuzza. mesenetric     HPI [name] is a 54 y.o. female.  I am asked to see the patient by Dr. Terese Door for evaluation of mesenteric disease as a possible cause of recurrent colitis symptoms.  The patient is had 3 separate episodes of significant colitis.  She has abrupt onset of abdominal pain with bowel changes.  This has resolved each time with antibiotics and has not required surgical therapy.  As part of her most recent admission last month, she underwent a CT scan of the abdomen without contrast which I have reviewed.  Although this is an uninfused scan with wide cuts, she does have significant aortic and SMA calcification consistent with atherosclerotic disease.  No determination on the flow could be made from the study.  Since this hospitalization, she is doing much better.  No current abdominal pain or problems.  To evaluate her mesenteric perfusion a duplex was done today.  This demonstrated normal flow and velocities within the celiac artery, SMA, and IMA without obvious focal stenosis.     Past Medical History:  Diagnosis Date  . ACUT MI ANTEROLAT WALL SUBSQT EPIS CARE   . Acute maxillary sinusitis   . ALLERGIC RHINITIS, SEASONAL   . ARTHRITIS, RHEUMATOID    shoulders and hands Enbrel>leg swelling Dr. Estanislado Pandy  . Atrial fibrillation ([place])    a. after CABG.  . Back pain   . Bruxism (teeth grinding)   . CAD, ARTERY BYPASS GRAFT    a. DES to RCA in 2010 then LAD occlusion s/p CABG 3 06/07/2009 with LIMA to LAD, reverse SVG to D1, reverse SVG to distal RCA. b. Cath 05/08/2016 slightly hypodense region in the intermediate branch, however she had excellent flow, FFR was normal. Vein graft to PDA and the posterolateral branch is patent, patent LIMA to LAD, occluded SVG to diagonal.  . CARPAL TUNNEL SYNDROME,  BILATERAL   . CHOLELITHIASIS   . Contrast media allergy   . DERMATITIS, ALLERGIC   . DIABETES MELLITUS, TYPE I    on insulin pump dx'ed age 69 y.o   . DIABETIC  RETINOPATHY   . Hiatal hernia   . HYPERLIPIDEMIA-MIXED   . HYPERTHYROIDISM    Dr. Dollene Cleveland  . Insulin pump in place   . MIGRAINE W/O AURA W/INTRACT W/STATUS MIGRAINOSUS 02/19/2008  . Psoriasis   . SINUS TACHYCARDIA 11/08/2010  . SVT (supraventricular tachycardia) (HCC)    after s/p CABG  . TRIGGER FINGER    all fingers b/l hands   . URI     Past Surgical History:  Procedure Laterality Date  . ABDOMINAL HYSTERECTOMY     endometriomas b/l; total ? cervix removed; no h/o abnormal pap  . Caesarean section    . CARDIAC CATHETERIZATION N/A 05/08/2016   Procedure: Left Heart Cath and Cors/Grafts Angiography;  Surgeon: Burnell Blanks, MD;  Location: [facility];  Service: Cardiovascular;  Laterality: N/A;  . CARPAL TUNNEL RELEASE     b/l   . CATARACT EXTRACTION, BILATERAL    . CHOLECYSTECTOMY    . COLONOSCOPY WITH PROPOFOL N/A 03/25/2020   Procedure: COLONOSCOPY WITH PROPOFOL;  Surgeon: Jonathon Bellows, MD;  [facility] ENDOSCOPY;  Service: Gastroenterology;  Laterality: N/A;  . CORONARY ARTERY BYPASS GRAFT    . ESOPHAGOGASTRODUODENOSCOPY (EGD) WITH PROPOFOL  N/A 03/25/2020   Procedure: ESOPHAGOGASTRODUODENOSCOPY (EGD) WITH PROPOFOL;  Surgeon: Jonathon Bellows, MD;  Location: [facility] ENDOSCOPY;  Service: Gastroenterology;  Laterality: N/A;  . EYE SURGERY     laser x 2 for retinopathy   . LEFT HEART CATH AND CORS/GRAFTS ANGIOGRAPHY N/A 02/22/2020   Procedure: LEFT HEART CATH AND CORS/GRAFTS ANGIOGRAPHY;  Surgeon: Burnell Blanks, MD;  Location: [place] [facility];  Service: Cardiovascular;  Laterality: N/A;  . TRIGGER FINGER RELEASE     b/l fingers all   . VITRECTOMY     b/l      Family History  Problem Relation Age of Onset  . Depression Mother   . Anxiety disorder Mother   . Colon polyps Father   .  Diabetes Father        2  . Hypertension Father   . Parkinson's disease Father   . Diabetes Other   . Heart disease Other   . Hypertension Other   . Hyperlipidemia Other   . Depression Other   . Migraines Other   . Stroke Paternal Grandmother   . Heart attack Neg Hx   . Colon cancer Neg Hx   . Stomach cancer Neg Hx   . Esophageal cancer Neg Hx      Social History   Tobacco Use  . Smoking status: Never Smoker  . Smokeless tobacco: Never Used  Vaping Use  . Vaping Use: Never used  Substance Use Topics  . Alcohol use: Yes    Comment: rarely 1 every 6 months  . Drug use: No    Allergies  Allergen Reactions  . Actemra [Tocilizumab]   . Prochlorperazine Rash    Neuro problems per pt  . Ramipril Swelling, Rash and Other (See Comments)  . Shellfish-Derived Products Swelling    Shrimp   . Atorvastatin Rash    Elevated LFT's  . Compazine  [Prochlorperazine Edisylate] Other (See Comments)    Neurological reaction  . Emgality [Galcanezumab-Gnlm] Hives and Swelling  . Etanercept Swelling and Rash  . Infliximab Rash  . Iohexol     Iv contrast dye -rash all over  . Orencia [Abatacept] Rash  . Prochlorperazine Edisylate     unknown  . Shellfish Allergy Swelling    Shrimp(Facial swelling)  . Tofacitinib Rash and Other (See Comments)    Severe abdominal pain   . [employer]]     [employer], memory issues, word finding issues  . Amiodarone Nausea Only  . Rituximab Rash    Causes a rash    Current Outpatient Medications  Medication Sig Dispense Refill  . amLODipine (NORVASC) 5 MG tablet Take 0.5 tablets (2.5 mg total) by mouth daily. 45 tablet 3  . aspirin EC 81 MG tablet Take 81 mg by mouth daily.    . CHELATED MAGNESIUM PO Take 250 mg by mouth daily.    . clopidogrel (PLAVIX) 75 MG tablet TAKE 1 TABLET BY MOUTH ONCE DAILY 90 tablet 3  . cyclobenzaprine (FLEXERIL) 10 MG tablet TAKE 1 TABLET BY MOUTH AT BEDTIME 30 tablet 0  . dicyclomine (BENTYL) 20 MG  tablet Take 1 tablet (20 mg total) by mouth 3 (three) times daily as needed for spasms (abdominal pain). 270 tablet 0  . EPINEPHrine 0.3 mg/0.3 mL IJ SOAJ injection Inject 0.3 mLs (0.3 mg total) into the muscle as needed for anaphylaxis. 1 Device prn  . folic acid (FOLVITE) 1 MG tablet .AKE 2 TABLETS (2 MG TOTAL) BY MOUTH DAILY. 180 tablet  1  . furosemide (LASIX) 40 MG tablet Take 1 tablet (40 mg total) by mouth daily as needed for edema. (Patient taking differently: Take 40 mg by mouth daily. ) 90 tablet 3  . insulin lispro (HUMALOG) 100 UNIT/ML injection Inject 80-90 Units into the skin continuous. FOR USE IN INSULIN PUMP. TOTAL DAILY INSULIN DOSE = UP TO 90 UNITS.    Marland Kitchen isosorbide mononitrate (IMDUR) 60 MG 24 hr tablet TAKE 1 TABLET (60 MG TOTAL) BY MOUTH DAILY. 90 tablet 3  . Krill Oil 500 MG CAPS Take 500 mg by mouth daily.    Marland Kitchen leflunomide (ARAVA) 20 MG tablet TAKE 1 TABLET BY MOUTH DAILY 90 tablet 0  . linaclotide (LINZESS) 290 MCG CAPS capsule Take 1 capsule (290 mcg total) by mouth daily before breakfast. 90 capsule 3  . losartan (COZAAR) 100 MG tablet Take 1 tablet (100 mg total) by mouth daily. 30 tablet 11  . metoprolol tartrate (LOPRESSOR) 50 MG tablet TAKE 1 TABLET BY MOUTH TWICE DAILY 180 tablet 3  . omeprazole (PRILOSEC) 40 MG capsule     . ondansetron (ZOFRAN-ODT) 4 MG disintegrating tablet Take 4 mg by mouth every 8 (eight) hours as needed.    . ONE TOUCH ULTRA TEST test strip   1  . pantoprazole (PROTONIX) 40 MG tablet Take 1 tablet (40 mg total) by mouth daily. 30 tablet 11  . potassium chloride (KLOR-CON) 10 MEQ tablet Take 1 tablet (10 mEq total) by mouth daily. 90 tablet 3  . promethazine (PHENERGAN) 25 MG tablet Take 0.5-1 tablets (12.5-25 mg total) by mouth 2 (two) times daily as needed for nausea or vomiting. 60 tablet 2  . rosuvastatin (CRESTOR) 20 MG tablet Take 1 tablet (20 mg total) by mouth daily. 90 tablet 3  . Upadacitinib ER (RINVOQ) 15 MG TB24 Take 15 mg by mouth  daily. 90 tablet 0  . nitroGLYCERIN (NITROSTAT) 0.4 MG SL tablet Place 1 tablet (0.4 mg total) under the tongue every 5 (five) minutes as needed. 25 tablet 3   No current facility-administered medications for this visit.      REVIEW OF SYSTEMS (Negative unless checked)  Constitutional: _0 Weight loss  _1 Fever  _2 Chills Cardiac: _3 Chest pain   _4 Chest pressure   _5 Palpitations   _6 Shortness of breath when laying flat   _7 Shortness of breath at rest   _8 Shortness of breath with exertion. Vascular:  _9 Pain in legs with walking   _10 Pain in legs at rest   _11 Pain in legs when laying flat   _12 Claudication   _13 Pain in feet when walking  _14 Pain in feet at rest  _15 Pain in feet when laying flat   _16 History of DVT   _17 Phlebitis   _18 Swelling in legs   _19 Varicose veins   _20 Non-healing ulcers Pulmonary:   _21 Uses home oxygen   _22 Productive cough   _23 Hemoptysis   _24 Wheeze  _25 COPD   _26 Asthma Neurologic:  _27 Dizziness  _28 Blackouts   _29 Seizures   _30 History of stroke   _31 History of TIA  _32 Aphasia   _33 Temporary blindness   _34 Dysphagia   _35 Weakness or numbness in arms   _36 Weakness or numbness in legs Musculoskeletal:  _37 Arthritis   _38 Joint swelling   _39 Joint pain   _40 Low back pain Hematologic:  _41 Easy bruising  _42 Easy bleeding   _43 Hypercoagulable state   _44 Anemic  _45 Hepatitis Gastrointestinal:  _46 Blood in stool   _47 Vomiting blood  _48 Gastroesophageal reflux/heartburn   _49 Abdominal pain Genitourinary:  _50 Chronic kidney disease   _51 Difficult urination  _52 Frequent urination  _53 Burning with  urination   _0 Hematuria Skin:  _1 Rashes   _2 Ulcers   _3 Wounds Psychological:  _4 History of anxiety   _5  History of major depression.    Physical Exam BP 129/74   Pulse 61   Ht _6  (1.6 m)   Wt 174 lb (78.9 kg)   BMI 30.82 kg/m  Gen:  WD/WN, NAD Head: [place]/AT, No temporalis wasting.  Ear/Nose/Throat: Hearing grossly intact, nares w/o erythema or drainage, oropharynx w/o Erythema/Exudate Eyes: Conjunctiva clear, sclera  non-icteric  Neck: trachea midline.  No JVD.  Pulmonary:  Good air movement, respirations not labored, no use of accessory muscles  Cardiac: RRR, no JVD. Well healed sternotomy scar. Vascular:  Vessel Right Left  Radial Palpable Palpable                                   Gastrointestinal:. No masses, surgical incisions, or scars. Musculoskeletal: M/S 5/5 throughout.  Extremities without ischemic changes.  No deformity or atrophy. No edema. Neurologic: Sensation grossly intact in extremities.  Symmetrical.  Speech is fluent. Motor exam as listed above. Psychiatric: Judgment intact, Mood & affect appropriate for pt's clinical situation. Dermatologic: No rashes or ulcers noted.  No cellulitis or open wounds.    Radiology No results found.  Labs Recent Results (from the past 2160 hour(s))  CBC with Differential/Platelet     Status: None   Collection Time: 07/14/20  9:06 AM  Result Value Ref Range   WBC 5.3 3.8 - 10.8 Thousand/uL   RBC 4.44 3.80 - 5.10 Million/uL   Hemoglobin 13.3 11.7 - 15.5 g/dL   HCT 39.6 35 - 45 %   MCV 89.2 80.0 - 100.0 fL   MCH 30.0 27.0 - 33.0 pg   MCHC 33.6 32.0 - 36.0 g/dL   RDW 12.1 11.0 - 15.0 %   Platelets 213 140 - 400 Thousand/uL   MPV 10.0 7.5 - 12.5 fL   Neutro Abs 1,701 1,500 - 7,800 cells/uL   Lymphs Abs 2,873 850 - 3,900 cells/uL   Absolute Monocytes 551 200 - 950 cells/uL   Eosinophils Absolute 133 15 - 500 cells/uL   Basophils Absolute 42 0 - 200 cells/uL   Neutrophils Relative % 32.1 %   Total Lymphocyte 54.2 %   Monocytes Relative 10.4 %   Eosinophils Relative 2.5 %   Basophils Relative 0.8 %  COMPLETE METABOLIC PANEL WITH GFR     Status: Abnormal   Collection Time: 07/14/20  9:06 AM  Result Value Ref Range   Glucose, Bld 107 (H) 65 - 99 mg/dL    Comment: .            Fasting reference interval . For someone without known diabetes, a glucose value between 100 and 125 mg/dL is consistent with prediabetes and should be  confirmed with a follow-up test. .    BUN 17 7 - 25 mg/dL   Creat 0.99 0.50 - 1.05 mg/dL    Comment: For patients >29 years of age, the reference limit for Creatinine is approximately 13% higher for people identified as African-American. .    GFR, Est Non African American 66 > OR = 60 mL/min/1.63m   GFR, Est African American 76 > OR = 60 mL/min/1.769m  BUN/Creatinine Ratio NOT APPLICABLE 6 - 22 (calc)   Sodium 139 135 - 146 mmol/L   Potassium 3.6 3.5 - 5.3 mmol/L   Chloride 99 98 - 110 mmol/L  CO2 30 20 - 32 mmol/L   Calcium 9.4 8.6 - 10.4 mg/dL   Total Protein 6.5 6.1 - 8.1 g/dL   Albumin 4.6 3.6 - 5.1 g/dL   Globulin 1.9 1.9 - 3.7 g/dL (calc)   AG Ratio 2.4 1.0 - 2.5 (calc)   Total Bilirubin 0.5 0.2 - 1.2 mg/dL   Alkaline phosphatase (APISO) 91 37 - 153 U/L   AST 34 10 - 35 U/L   ALT 31 (H) 6 - 29 U/L  HM DIABETES EYE EXAM     Status: Abnormal   Collection Time: 08/11/20 12:00 AM  Result Value Ref Range   HM Diabetic Eye Exam Retinopathy (A) No Retinopathy    Comment: dm1 with prolif ret. OU mac thinning/?glaucoma/[place]  CBC with Differential     Status: Abnormal   Collection Time: 08/28/20  9:14 AM  Result Value Ref Range   WBC 9.7 4.0 - 10.5 K/uL   RBC 3.81 (L) 3.87 - 5.11 MIL/uL   Hemoglobin 12.0 12.0 - 15.0 g/dL   HCT 34.5 (L) 36 - 46 %   MCV 90.6 80.0 - 100.0 fL   MCH 31.5 26.0 - 34.0 pg   MCHC 34.8 30.0 - 36.0 g/dL   RDW 12.3 11.5 - 15.5 %   Platelets 160 150 - 400 K/uL   nRBC 0.0 0.0 - 0.2 %   Neutrophils Relative % 84 %   Neutro Abs 8.0 (H) 1.7 - 7.7 K/uL   Lymphocytes Relative 9 %   Lymphs Abs 0.9 0.7 - 4.0 K/uL   Monocytes Relative 6 %   Monocytes Absolute 0.6 0 - 1 K/uL   Eosinophils Relative 0 %   Eosinophils Absolute 0.0 0 - 0 K/uL   Basophils Relative 0 %   Basophils Absolute 0.0 0 - 0 K/uL   Immature Granulocytes 1 %   Abs Immature Granulocytes 0.05 0.00 - 0.07 K/uL    Comment: Performed at [facility], [address] [place], [place] [address]  Urinalysis, Routine w reflex microscopic     Status: Abnormal   Collection Time: 08/28/20  9:40 AM  Result Value Ref Range   Color, Urine YELLOW YELLOW   APPearance CLEAR CLEAR   Specific Gravity, Urine 1.025 1.005 - 1.030   pH 5.0 5.0 - 8.0   Glucose, UA NEGATIVE NEGATIVE mg/dL   Hgb urine dipstick NEGATIVE NEGATIVE   Bilirubin Urine NEGATIVE NEGATIVE   Ketones, ur 15 (A) NEGATIVE mg/dL   Protein, ur NEGATIVE NEGATIVE mg/dL   Nitrite NEGATIVE NEGATIVE   Leukocytes,Ua NEGATIVE NEGATIVE    Comment: Microscopic not done on urines with negative protein, blood, leukocytes, nitrite, or glucose < 500 mg/dL. Performed at [facility], [address] [place], [place] [address]   Comprehensive metabolic panel     Status: Abnormal   Collection Time: 08/28/20  9:55 AM  Result Value Ref Range   Sodium 138 135 - 145 mmol/L   Potassium 3.7 3.5 - 5.1 mmol/L   Chloride 103 98 - 111 mmol/L   CO2 24 22 - 32 mmol/L   Glucose, Bld 177 (H) 70 - 99 mg/dL    Comment: Glucose reference range applies only to samples taken after fasting for at least 8 hours.   BUN 19 6 - 20 mg/dL   Creatinine, Ser 0.88 0.44 - 1.00 mg/dL   Calcium 8.7 (L) 8.9 - 10.3 mg/dL   Total Protein 6.6 6.5 - 8.1 g/dL   Albumin  4.2 3.5 - 5.0 g/dL   AST 31 15 - 41 U/L   ALT 24 0 - 44 U/L   Alkaline Phosphatase 65 38 - 126 U/L   Total Bilirubin 0.7 0.3 - 1.2 mg/dL   GFR calc non Af Amer >60 >60 mL/min   GFR calc Af Amer >60 >60 mL/min   Anion gap 11 5 - 15    Comment: Performed at [facility], [address] [place], [place] [address]  Lipase, blood     Status: None   Collection Time: 08/28/20  9:55 AM  Result Value Ref Range   Lipase 18 11 - 51 U/L    Comment: Performed at [facility], [address] [place], [place] [address]    Assessment/Plan:  Diabetic retinopathy associated with type 1 diabetes mellitus ([place]) blood glucose control important in reducing  the progression of atherosclerotic disease. Also, involved in wound healing. On appropriate medications.   Hypertension, essential blood pressure control important in reducing the progression of atherosclerotic disease. On appropriate oral medications.   Coronary artery disease involving native coronary artery of native heart with angina pectoris (HCC) S/P CABG  Colitis To evaluate her mesenteric perfusion a duplex was done today.  This demonstrated normal flow and velocities within the celiac artery, SMA, and IMA without obvious focal stenosis.  We discussed that it is still possible that she has ischemic colitis as the cause of her colitis, but it does not appear to be from large vessel disease that would be amenable to revascularization.  Small vessel ischemic colitis is managed medically or with colectomy.  At this point, no vascular intervention is likely to be of any benefit.  We could monitor her mesenteric vessels for progression of disease on an annual or every other year basis given the findings of significant calcification on CT scan.      Leotis Pain 09/27/2020, 11:25 AM   This note was created with Dragon medical transcription system.  Any errors from dictation are unintentional.

## 2020-09-27 NOTE — Assessment & Plan Note (Signed)
blood pressure control important in reducing the progression of atherosclerotic disease. On appropriate oral medications.  

## 2020-09-27 NOTE — Assessment & Plan Note (Signed)
To evaluate her mesenteric perfusion a duplex was done today.  This demonstrated normal flow and velocities within the celiac artery, SMA, and IMA without obvious focal stenosis.  We discussed that it is still possible that she has ischemic colitis as the cause of her colitis, but it does not appear to be from large vessel disease that would be amenable to revascularization.  Small vessel ischemic colitis is managed medically or with colectomy.  At this point, no vascular intervention is likely to be of any benefit.  We could monitor her mesenteric vessels for progression of disease on an annual or every other year basis given the findings of significant calcification on CT scan.

## 2020-09-28 ENCOUNTER — Other Ambulatory Visit: Payer: Self-pay

## 2020-09-28 DIAGNOSIS — R109 Unspecified abdominal pain: Secondary | ICD-10-CM

## 2020-09-28 DIAGNOSIS — R9389 Abnormal findings on diagnostic imaging of other specified body structures: Secondary | ICD-10-CM

## 2020-10-03 NOTE — Progress Notes (Deleted)
Office Visit Note  Patient: [name]             Date of Birth: November 27, 1966           MRN: [id number]             PCP: McLean-Scocuzza, Nino Glow, MD Referring: Orland Mustard * Visit Date: 10/13/2020 Occupation: @GUAROCC @  Subjective:  No chief complaint on file.   History of Present Illness: [name] is a 54 y.o. female ***   Activities of Daily Living:  Patient reports morning stiffness for *** {minute/hour:19697}.   Patient {ACTIONS;DENIES/REPORTS:21021675::"Denies"} nocturnal pain.  Difficulty dressing/grooming: {ACTIONS;DENIES/REPORTS:21021675::"Denies"} Difficulty climbing stairs: {ACTIONS;DENIES/REPORTS:21021675::"Denies"} Difficulty getting out of chair: {ACTIONS;DENIES/REPORTS:21021675::"Denies"} Difficulty using hands for taps, buttons, cutlery, and/or writing: {ACTIONS;DENIES/REPORTS:21021675::"Denies"}  No Rheumatology ROS completed.   PMFS History:  Patient Active Problem List   Diagnosis Date Noted  . Diabetic retinopathy associated with type 1 diabetes mellitus ([place]) 09/27/2020  . Diabetic retinopathy ([place]) 09/27/2020  . Abrahm Mancia' disease 09/27/2020  . Chronic coronary artery disease 09/27/2020  . Coronary artery disease 09/27/2020  . Hypertension associated with diabetes ([place]) 09/14/2020  . Obesity (BMI 30-39.9) 09/14/2020  . Annual physical exam 09/14/2020  . Aortic atherosclerosis ([place]) 09/14/2020  . Snoring 05/12/2020  . Lung nodule 03/10/2020  . Colitis 03/10/2020  . Type 1 diabetes mellitus with proliferative retinopathy ([place]) 12/10/2019  . Abnormal thyroid function test 12/10/2019  . Bruxism (teeth grinding)   . Chronic migraine 10/07/2019  . Contusion of left hip 07/25/2019  . H/O multiple allergies 03/10/2019  . Hx of anaphylaxis 03/10/2019  . Cough 06/26/2018  . PND (post-nasal drip) 06/26/2018  . Lumbar strain, initial encounter 04/10/2018  . Thoracic myofascial strain 04/10/2018  . Vitamin D deficiency 07/04/2017  . B12  deficiency 07/04/2017  . Abnormal CT scan 06/19/2017  . Gastroesophageal reflux disease 06/19/2017  . Antiplatelet or antithrombotic long-term use 06/19/2017  . Nasal congestion 03/15/2017  . Dorleen Kissel disease 02/01/2017  . Sleep difficulties 02/01/2017  . No energy 02/01/2017  . Osteopenia 02/01/2017  . Bilateral hand pain 07/16/2016  . Acquired trigger finger 07/16/2016  . Hypertension, essential 05/08/2016  . IDDM (insulin dependent diabetes mellitus) 05/08/2016  . Coronary artery disease involving native coronary artery of native heart with angina pectoris ([place])   . Foot pain, right 08/30/2014  . Shoulder pain, right 08/30/2014  . Chronic pain syndrome 01/15/2014  . Multiple joint pain 01/15/2014  . Lesion of lower eyelid 04/21/2013  . Generalized constriction of visual field 03/31/2013  . Neoplasm of uncertain behavior of skin of eyelid 03/31/2013  . Posterior capsular opacification 03/31/2013  . Cyst of left lower eyelid 03/30/2013  . Pseudophakia of both eyes 03/30/2013  . Prurigo nodularis 02/05/2013  . Stasis dermatitis 02/05/2013  . Psoriasis 01/21/2013  . Trochanteric bursitis 11/10/2012  . Achilles tendinitis 07/11/2012  . Epiretinal membrane 06/17/2012  . Status post cataract extraction 04/30/2012  . Pes equinus, acquired 04/23/2012  . Tendinitis of ankle 04/23/2012  . Proliferative diabetic retinopathy of both eyes ([place]) 01/11/2012  . Ongoing use of possibly toxic medication 12/05/2011  . Hyperthyroidism 11/09/2010  . SINUS TACHYCARDIA 11/08/2010  . DERMATITIS, ALLERGIC 07/20/2010  . EDEMA 07/12/2010  . DIZZINESS 11/14/2009  . ATRIAL FIBRILLATION 07/20/2009  . CAD, ARTERY BYPASS GRAFT 06/07/2009  . ANGINA, STABLE/EXERTIONAL 06/02/2009  . HYPERLIPIDEMIA-MIXED 04/26/2009  . ACUT MI ANTEROLAT WALL SUBSQT EPIS CARE 04/26/2009  . Chest pain 04/26/2009  . DIABETIC  RETINOPATHY 04/25/2009  . CARPAL TUNNEL SYNDROME,  BILATERAL 04/25/2009  . TRIGGER FINGER 04/25/2009    . MIGRAINE W/O AURA W/INTRACT W/STATUS MIGRAINOSUS 02/19/2008  . ALLERGIC RHINITIS, SEASONAL 10/16/2006  . CHOLELITHIASIS 10/16/2006  . Rheumatoid arthritis ([place]) 10/16/2006    Past Medical History:  Diagnosis Date  . ACUT MI ANTEROLAT WALL SUBSQT EPIS CARE   . Acute maxillary sinusitis   . ALLERGIC RHINITIS, SEASONAL   . ARTHRITIS, RHEUMATOID    shoulders and hands Enbrel>leg swelling Dr. Estanislado Pandy  . Atrial fibrillation ([place])    a. after CABG.  . Back pain   . Bruxism (teeth grinding)   . CAD, ARTERY BYPASS GRAFT    a. DES to RCA in 2010 then LAD occlusion s/p CABG 3 06/07/2009 with LIMA to LAD, reverse SVG to D1, reverse SVG to distal RCA. b. Cath 05/08/2016 slightly hypodense region in the intermediate branch, however she had excellent flow, FFR was normal. Vein graft to PDA and the posterolateral branch is patent, patent LIMA to LAD, occluded SVG to diagonal.  . CARPAL TUNNEL SYNDROME, BILATERAL   . CHOLELITHIASIS   . Contrast media allergy   . DERMATITIS, ALLERGIC   . DIABETES MELLITUS, TYPE I    on insulin pump dx'ed age 63 y.o   . DIABETIC  RETINOPATHY   . Hiatal hernia   . HYPERLIPIDEMIA-MIXED   . HYPERTHYROIDISM    Dr. Dollene Cleveland  . Insulin pump in place   . MIGRAINE W/O AURA W/INTRACT W/STATUS MIGRAINOSUS 02/19/2008  . Psoriasis   . SINUS TACHYCARDIA 11/08/2010  . SVT (supraventricular tachycardia) ([facility])    after s/p CABG  . TRIGGER FINGER    all fingers b/l hands   . URI     Family History  Problem Relation Age of Onset  . Depression Mother   . Anxiety disorder Mother   . Colon polyps Father   . Diabetes Father        2  . Hypertension Father   . Parkinson's disease Father   . Diabetes Other   . Heart disease Other   . Hypertension Other   . Hyperlipidemia Other   . Depression Other   . Migraines Other   . Stroke Paternal Grandmother   . Heart attack Neg Hx   . Colon cancer Neg Hx   . Stomach cancer Neg Hx   . Esophageal cancer Neg Hx    Past  Surgical History:  Procedure Laterality Date  . ABDOMINAL HYSTERECTOMY     endometriomas b/l; total ? cervix removed; no h/o abnormal pap  . Caesarean section    . CARDIAC CATHETERIZATION N/A 05/08/2016   Procedure: Left Heart Cath and Cors/Grafts Angiography;  Surgeon: Burnell Blanks, MD;  Location: [facility];  Service: Cardiovascular;  Laterality: N/A;  . CARPAL TUNNEL RELEASE     b/l   . CATARACT EXTRACTION, BILATERAL    . CHOLECYSTECTOMY    . COLONOSCOPY WITH PROPOFOL N/A 03/25/2020   Procedure: COLONOSCOPY WITH PROPOFOL;  Surgeon: Jonathon Bellows, MD;  Location: [facility];  Service: Gastroenterology;  Laterality: N/A;  . CORONARY ARTERY BYPASS GRAFT    . ESOPHAGOGASTRODUODENOSCOPY (EGD) WITH PROPOFOL N/A 03/25/2020   Procedure: ESOPHAGOGASTRODUODENOSCOPY (EGD) WITH PROPOFOL;  Surgeon: Jonathon Bellows, MD;  Location: [facility] END[facility]COPY;  Service: Gastroenterology;  Laterality: N/A;  . EYE SURGERY     laser x 2 for retinopathy   . LEFT HEART CATH AND CORS/GRAFTS ANGIOGRAPHY N/A 02/22/2020   Procedure: LEFT HEART CATH AND CORS/GRAFTS ANGIOGRAPHY;  Surgeon: Burnell Blanks, MD;  Location: [facility];  Service: Cardiovascular;  Laterality: N/A;  . TRIGGER FINGER RELEASE     b/l fingers all   . VITRECTOMY     b/l    Social History   Social History Narrative   Divorced. Has 2 kids(fraternal twins) daughters age 75. Works at [employer], Never smoked, denies ETOH, no drugs. Drinks diet coke. No exercise.       DPR daughters [name] and Lakindra Wible twins          Immunization History  Administered Date(s) Administered  . Influenza, Quadrivalent, Recombinant, Inj, Pf 10/14/2013  . Influenza, Seasonal, Injecte, Preservative Fre 09/19/2016  . Influenza,inj,Quad PF,6+ Mos 09/30/2014, 09/03/2018  . Influenza,inj,quad, With Preservative 09/23/2017  . Influenza-Unspecified 09/30/2014, 09/30/2016, 09/03/2018, 09/10/2019  . PFIZER SARS-COV-2 Vaccination  12/28/2019, 01/18/2020  . Pneumococcal Conjugate-13 07/16/2016  . Pneumococcal Polysaccharide-23 01/01/2004, 10/15/2006, 01/04/2020  . Td 10/01/2003  . Tdap 01/09/2011, 06/25/2018  . Zoster Recombinat (Shingrix) 09/14/2020     Objective: Vital Signs: There were no vitals taken for this visit.   Physical Exam   Musculoskeletal Exam: ***  CDAI Exam: CDAI Score: -- Patient Global: --; Provider Global: -- Swollen: --; Tender: -- Joint Exam 10/13/2020   No joint exam has been documented for this visit   There is currently no information documented on the homunculus. Go to the Rheumatology activity and complete the homunculus joint exam.  Investigation: No additional findings.  Imaging: VAS [place] MESENTERIC  Result Date: 09/27/2020 ABDOMINAL VISCERAL Indications: Colitis hx              intermittant abd pain Limitations: Air/bowel gas. Performing Technologist: Concha Norway RVT  Examination Guidelines: A complete evaluation includes B-mode imaging, spectral Doppler, color Doppler, and power Doppler as needed of all accessible portions of each vessel. Bilateral testing is considered an integral part of a complete examination. Limited examinations for reoccurring indications may be performed as noted.  Duplex Findings: +--------------------+--------+--------+------+--------+ Mesenteric          PSV cm/sEDV cm/sPlaqueComments +--------------------+--------+--------+------+--------+ Aorta Prox             70                          +--------------------+--------+--------+------+--------+ Celiac Artery Origin   57                          +--------------------+--------+--------+------+--------+ SMA Proximal           90                          +--------------------+--------+--------+------+--------+ SMA Mid                58                          +--------------------+--------+--------+------+--------+ CHA                   131                           +--------------------+--------+--------+------+--------+ Splenic               106                          +--------------------+--------+--------+------+--------+    Summary: Largest  Aortic Diameter: 1.7 cm  Mesenteric: Normal Celiac artery , Superior Mesenteric artery, Inferior Mesenteric artery, Splenic artery and Hepatic artery findings.  *See table(s) above for measurements and observations.  Diagnosing physician: Leotis Pain MD  Electronically signed by Leotis Pain MD on 09/27/2020 at 12:26:42 PM.    Final     Recent Labs: Lab Results  Component Value Date   WBC 9.7 08/28/2020   HGB 12.0 08/28/2020   PLT 160 08/28/2020   NA 138 08/28/2020   K 3.7 08/28/2020   CL 103 08/28/2020   CO2 24 08/28/2020   GLUCOSE 177 (H) 08/28/2020   BUN 19 08/28/2020   CREATININE 0.88 08/28/2020   BILITOT 0.7 08/28/2020   ALKPHOS 65 08/28/2020   AST 31 08/28/2020   ALT 24 08/28/2020   PROT 6.6 08/28/2020   ALBUMIN 4.2 08/28/2020   CALCIUM 8.7 (L) 08/28/2020   GFRAA >60 08/28/2020   QFTBGOLDPLUS NEGATIVE 10/07/2019    Speciality Comments: PLQ Eye Exam: 01/06/2020 ABNORMAL @ [facility]. Patient advised to discontinue PLQ at office visit on 01/06/2020.  Prior Therapy: methotrexate/Xeljanz/Actemra (GI upset), Enbrel (injection site reaction) and Orencia/Remicade/Humira/Cimzia/Rituxan/Simponi/Simponi Aria (inadequate response).  Procedures:  No procedures performed Allergies: Actemra [tocilizumab], Prochlorperazine, Ramipril, Shellfish-derived products, Atorvastatin, Compazine  [prochlorperazine edisylate], Emgality [galcanezumab-gnlm], Etanercept, Infliximab, Iohexol, Orencia [abatacept], Prochlorperazine edisylate, Shellfish allergy, Tofacitinib, Trokendi xr [topiramate er], Amiodarone, and Rituximab   Assessment / Plan:     Visit Diagnoses: No diagnosis found.  Orders: No orders of the defined types were placed in this encounter.  No orders of the defined types were  placed in this encounter.   Face-to-face time spent with patient was *** minutes. Greater than 50% of time was spent in counseling and coordination of care.  Follow-Up Instructions: No follow-ups on file.   Earnestine Mealing, CMA  Note - This record has been created using Editor, commissioning.  Chart creation errors have been sought, but may not always  have been located. Such creation errors do not reflect on  the standard of medical care.

## 2020-10-04 ENCOUNTER — Ambulatory Visit
Admission: RE | Admit: 2020-10-04 | Discharge: 2020-10-04 | Disposition: A | Discharge status: Home / Self Care | Payer: 59 | Source: Ambulatory Visit | Attending: Gastroenterology | Admitting: Gastroenterology

## 2020-10-04 ENCOUNTER — Other Ambulatory Visit: Payer: Self-pay

## 2020-10-04 DIAGNOSIS — Z9049 Acquired absence of other specified parts of digestive tract: Secondary | ICD-10-CM | POA: Diagnosis not present

## 2020-10-04 DIAGNOSIS — R109 Unspecified abdominal pain: Secondary | ICD-10-CM | POA: Diagnosis not present

## 2020-10-04 DIAGNOSIS — R9389 Abnormal findings on diagnostic imaging of other specified body structures: Secondary | ICD-10-CM | POA: Diagnosis not present

## 2020-10-04 DIAGNOSIS — Z90710 Acquired absence of both cervix and uterus: Secondary | ICD-10-CM | POA: Diagnosis not present

## 2020-10-04 DIAGNOSIS — K529 Noninfective gastroenteritis and colitis, unspecified: Secondary | ICD-10-CM | POA: Diagnosis not present

## 2020-10-06 ENCOUNTER — Ambulatory Visit: Admission: RE | Admit: 2020-10-06 | Payer: 59 | Source: Ambulatory Visit

## 2020-10-06 ENCOUNTER — Encounter: Payer: Self-pay | Admitting: Internal Medicine

## 2020-10-07 NOTE — Progress Notes (Signed)
Office Visit Note  Patient: [name]             Date of Birth: [birth year]           MRN: [id number]             PCP: McLean-Scocuzza, Nino Glow, MD Referring: Orland Mustard * Visit Date: 10/10/2020 Occupation: @GUAROCC @  Subjective:  Fatigue   History of Present Illness: CIMBERLY STOFFEL is a 54 y.o. female with history of seronegative rheumatoid arthritis, psoriasis, and DDD. She is taking Rinvoq 15 mg 1 tablet by mouth daily and Arava 20 mg 1 tablet by mouth daily.  She has not missed any doses of Rinvoq or Arava recently.  She has noticed about a 40% improvement since starting on Rinvoq in June 2021.  She is tolerating both medications without any side effects.  She continues to experience intermittent pain and stiffness in both hands and both shoulder joints.  She has chronic lower back pain but denies any symptoms of radiculopathy.  She takes Flexeril 10 mg 1 tablet by mouth at bedtime for muscle spasms.  She continues to have significant fatigue and chronic insomnia.   Activities of Daily Living:  Patient reports morning stiffness for 1  hour.   Patient Reports nocturnal pain.  Difficulty dressing/grooming: Denies Difficulty climbing stairs: Reports Difficulty getting out of chair: Denies Difficulty using hands for taps, buttons, cutlery, and/or writing: Reports  Review of Systems  Constitutional: Positive for fatigue.  HENT: Negative for mouth sores, mouth dryness and nose dryness.   Eyes: Positive for dryness. Negative for pain and visual disturbance.  Respiratory: Negative for cough, hemoptysis, shortness of breath and difficulty breathing.   Cardiovascular: Negative for chest pain, palpitations, hypertension and swelling in legs/feet.  Gastrointestinal: Positive for constipation and diarrhea. Negative for blood in stool.  Endocrine: Negative for increased urination.  Genitourinary: Negative for painful urination.  Musculoskeletal: Positive for arthralgias,  joint pain, myalgias, muscle weakness, morning stiffness, muscle tenderness and myalgias. Negative for joint swelling.  Skin: Negative for color change, pallor, rash, hair loss, nodules/bumps, skin tightness, ulcers and sensitivity to sunlight.  Allergic/Immunologic: Negative for susceptible to infections.  Neurological: Positive for headaches. Negative for dizziness, numbness and weakness.  Hematological: Negative for swollen glands.  Psychiatric/Behavioral: Positive for sleep disturbance. Negative for depressed mood. The patient is not nervous/anxious.     PMFS History:  Patient Active Problem List   Diagnosis Date Noted   Diabetic retinopathy associated with type 1 diabetes mellitus ([place]) 09/27/2020   Diabetic retinopathy ([place]) 09/27/2020   Graves' disease 09/27/2020   Chronic coronary artery disease 09/27/2020   Coronary artery disease 09/27/2020   Hypertension associated with diabetes ([place]) 09/14/2020   Obesity (BMI 30-39.9) 09/14/2020   Annual physical exam 09/14/2020   Aortic atherosclerosis ([place]) 09/14/2020   Snoring 05/12/2020   Lung nodule 03/10/2020   Colitis 03/10/2020   Type 1 diabetes mellitus with proliferative retinopathy ([place]) 12/10/2019   Abnormal thyroid function test 12/10/2019   Bruxism (teeth grinding)    Chronic migraine 10/07/2019   Contusion of left hip 07/25/2019   H/O multiple allergies 03/10/2019   Hx of anaphylaxis 03/10/2019   Cough 06/26/2018   PND (post-nasal drip) 06/26/2018   Lumbar strain, initial encounter 04/10/2018   Thoracic myofascial strain 04/10/2018   Vitamin D deficiency 07/04/2017   B12 deficiency 07/04/2017   Abnormal CT scan 06/19/2017   Gastroesophageal reflux disease 06/19/2017   Antiplatelet or antithrombotic long-term use 06/19/2017  Nasal congestion 03/15/2017   Graves disease 02/01/2017   Sleep difficulties 02/01/2017   No energy 02/01/2017   Osteopenia 02/01/2017   Bilateral hand pain  07/16/2016   Acquired trigger finger 07/16/2016   Hypertension, essential 05/08/2016   IDDM (insulin dependent diabetes mellitus) 05/08/2016   Coronary artery disease involving native coronary artery of native heart with angina pectoris (HCC)    Foot pain, right 08/30/2014   Shoulder pain, right 08/30/2014   Chronic pain syndrome 01/15/2014   Multiple joint pain 01/15/2014   Lesion of lower eyelid 04/21/2013   Generalized constriction of visual field 03/31/2013   Neoplasm of uncertain behavior of skin of eyelid 03/31/2013   Posterior capsular opacification 03/31/2013   Cyst of left lower eyelid 03/30/2013   Pseudophakia of both eyes 03/30/2013   Prurigo nodularis 02/05/2013   Stasis dermatitis 02/05/2013   Psoriasis 01/21/2013   Trochanteric bursitis 11/10/2012   Achilles tendinitis 07/11/2012   Epiretinal membrane 06/17/2012   Status post cataract extraction 04/30/2012   Pes equinus, acquired 04/23/2012   Tendinitis of ankle 04/23/2012   Proliferative diabetic retinopathy of both eyes ([place]) 01/11/2012   Ongoing use of possibly toxic medication 12/05/2011   Hyperthyroidism 11/09/2010   SINUS TACHYCARDIA 11/08/2010   DERMATITIS, ALLERGIC 07/20/2010   EDEMA 07/12/2010   DIZZINESS 11/14/2009   ATRIAL FIBRILLATION 07/20/2009   CAD, ARTERY BYPASS GRAFT 06/07/2009   ANGINA, STABLE/EXERTIONAL 06/02/2009   HYPERLIPIDEMIA-MIXED 04/26/2009   ACUT MI ANTEROLAT WALL SUBSQT EPIS CARE 04/26/2009   Chest pain 04/26/2009   DIABETIC  RETINOPATHY 04/25/2009   CARPAL TUNNEL SYNDROME, BILATERAL 04/25/2009   TRIGGER FINGER 04/25/2009   MIGRAINE W/O AURA W/INTRACT W/STATUS MIGRAINOSUS 02/19/2008   ALLERGIC RHINITIS, SEASONAL 10/16/2006   CHOLELITHIASIS 10/16/2006   Rheumatoid arthritis ([place]) 10/16/2006    Past Medical History:  Diagnosis Date   ACUT MI ANTEROLAT WALL SUBSQT EPIS CARE    Acute maxillary sinusitis    ALLERGIC RHINITIS, SEASONAL      ARTHRITIS, RHEUMATOID    shoulders and hands Enbrel>leg swelling Dr. Estanislado Pandy   Atrial fibrillation ([place])    a. after CABG.   Back pain    Bruxism (teeth grinding)    CAD, ARTERY BYPASS GRAFT    a. DES to RCA in 2010 then LAD occlusion s/p CABG 3 06/07/2009 with LIMA to LAD, reverse SVG to D1, reverse SVG to distal RCA. b. Cath 05/08/2016 slightly hypodense region in the intermediate branch, however she had excellent flow, FFR was normal. Vein graft to PDA and the posterolateral branch is patent, patent LIMA to LAD, occluded SVG to diagonal.   CARPAL TUNNEL SYNDROME, BILATERAL    CHOLELITHIASIS    Contrast media allergy    DERMATITIS, ALLERGIC    DIABETES MELLITUS, TYPE I    on insulin pump dx'ed age 22 y.o    DIABETIC  RETINOPATHY    Hiatal hernia    HYPERLIPIDEMIA-MIXED    HYPERTHYROIDISM    Dr. Dollene Cleveland   Insulin pump in place    MIGRAINE W/O AURA W/INTRACT W/STATUS MIGRAINOSUS 02/19/2008   Psoriasis    SINUS TACHYCARDIA 11/08/2010   SVT (supraventricular tachycardia) (HCC)    after s/p CABG   TRIGGER FINGER    all fingers b/l hands    URI     Family History  Problem Relation Age of Onset   Depression Mother    Anxiety disorder Mother    Colon polyps Father    Diabetes Father        2  Hypertension Father    Parkinson's disease Father    Diabetes Other    Heart disease Other    Hypertension Other    Hyperlipidemia Other    Depression Other    Migraines Other    Stroke Paternal Grandmother    Heart attack Neg Hx    Colon cancer Neg Hx    Stomach cancer Neg Hx    Esophageal cancer Neg Hx    Past Surgical History:  Procedure Laterality Date   ABDOMINAL HYSTERECTOMY     endometriomas b/l; total ? cervix removed; no h/o abnormal pap   Caesarean section     CARDIAC CATHETERIZATION N/A 05/08/2016   Procedure: Left Heart Cath and Cors/Grafts Angiography;  Surgeon: Burnell Blanks, MD;  Location: [place];   Service: Cardiovascular;  Laterality: N/A;   CARPAL TUNNEL RELEASE     b/l    CATARACT EXTRACTION, BILATERAL     CHOLECYSTECTOMY     COLONOSCOPY WITH PROPOFOL N/A 03/25/2020   Procedure: COLONOSCOPY WITH PROPOFOL;  Surgeon: Jonathon Bellows, MD;  Location: [facility];  Service: Gastroenterology;  Laterality: N/A;   CORONARY ARTERY BYPASS GRAFT     ESOPHAGOGASTRODUODENOSCOPY (EGD) WITH PROPOFOL N/A 03/25/2020   Procedure: ESOPHAGOGASTRODUODENOSCOPY (EGD) WITH PROPOFOL;  Surgeon: Jonathon Bellows, MD;  Location: [facility] ENDOSCOPY;  Service: Gastroenterology;  Laterality: N/A;   EYE SURGERY     laser x 2 for retinopathy    LEFT HEART CATH AND CORS/GRAFTS ANGIOGRAPHY N/A 02/22/2020   Procedure: LEFT HEART CATH AND CORS/GRAFTS ANGIOGRAPHY;  Surgeon: Burnell Blanks, MD;  Location: [place] [facility];  Service: Cardiovascular;  Laterality: N/A;   TRIGGER FINGER RELEASE     b/l fingers all    VITRECTOMY     b/l    Social History   Social History Narrative   Divorced. Has 2 kids(fraternal twins) daughters age 27. Works at [employer], Never smoked, denies ETOH, no drugs. Drinks diet coke. No exercise.       DPR daughters [name] and Jolie Strohecker twins          Immunization History  Administered Date(s) Administered   Influenza, Quadrivalent, Recombinant, Inj, Pf 10/14/2013   Influenza, Seasonal, Injecte, Preservative Fre 09/19/2016   Influenza,inj,Quad PF,6+ Mos 09/30/2014, 09/03/2018   Influenza,inj,quad, With Preservative 09/23/2017   Influenza-Unspecified 09/30/2014, 09/30/2016, 09/03/2018, 09/10/2019   PFIZER SARS-COV-2 Vaccination 12/28/2019, 01/18/2020   Pneumococcal Conjugate-13 07/16/2016   Pneumococcal Polysaccharide-23 01/01/2004, 10/15/2006, 01/04/2020   Td 10/01/2003   Tdap 01/09/2011, 06/25/2018   Zoster Recombinat (Shingrix) 09/14/2020     Objective: Vital Signs: BP 122/75 (BP Location: Left Arm, Patient Position: Sitting, Cuff Size:  Normal)    Pulse (!) 58    Resp 14    Ht 5\' 3"  (1.6 m)    Wt 172 lb 12.8 oz (78.4 kg)    BMI 30.61 kg/m    Physical Exam Vitals and nursing note reviewed.  Constitutional:      Appearance: She is well-developed.  HENT:     Head: Normocephalic and atraumatic.  Eyes:     Conjunctiva/sclera: Conjunctivae normal.  Pulmonary:     Effort: Pulmonary effort is normal.  Abdominal:     Palpations: Abdomen is soft.  Musculoskeletal:     Cervical back: Normal range of motion.  Skin:    General: Skin is warm and dry.     Capillary Refill: Capillary refill takes less than 2 seconds.  Neurological:     Mental Status: She is alert and  oriented to person, place, and time.  Psychiatric:        Behavior: Behavior normal.      Musculoskeletal Exam: C-spine, thoracic spine, and lumbar spine good ROM.  Shoulder joints good ROM.  Tenderness over both AC joints.  Mild flexion contracture of both elbows.  Limited ROM of both wrist joints, R>L.  Tenderness of bilateral 3rd MCP joints.  Tenderness of the right 4th PIP. Complete fist formation bilaterally.  PIP and DIP thickening consistent with osteoarthritis of both hands.  Hip joints good ROM with discomfort in the right hip.  Knee joints good ROM with no warmth or effusion.  Tenderness of both ankle joints.   CDAI Exam: CDAI Score: 3.8  Patient Global: 4 mm; Provider Global: 4 mm Swollen: 0 ; Tender: 7  Joint Exam 10/10/2020      Right  Left  Acromioclavicular   Tender   Tender  MCP 3   Tender   Tender  PIP 4   Tender     Ankle   Tender   Tender     Investigation: No additional findings.  Imaging: CT ABDOMEN PELVIS WO CONTRAST  Result Date: 10/04/2020 CLINICAL DATA:  Ascending colitis.  Follow-up exam. EXAM: CT ABDOMEN AND PELVIS WITHOUT CONTRAST TECHNIQUE: Multidetector CT imaging of the abdomen and pelvis was performed following the standard protocol without IV contrast. COMPARISON:  08/28/2020 FINDINGS: Lower chest: Lung bases are clear.  Hepatobiliary: No focal hepatic lesion. Postcholecystectomy. No biliary dilatation. Pancreas: Pancreas is normal. No ductal dilatation. No pancreatic inflammation. Spleen: Normal spleen Adrenals/urinary tract: Adrenal glands and kidneys are normal. The ureters and bladder normal. Stomach/Bowel: Stomach, duodenum small-bowel normal. Cecum and terminal ileum normal. Appendix not identified. Interval resolution of thebowel wall thickening involving the ascending colon. Moderate volume stool in the ascending colon and transverse colon. Descending colon normal. Rectosigmoid colon normal. Vascular/Lymphatic: Abdominal aorta is normal caliber with atherosclerotic calcification. There is no retroperitoneal or periportal lymphadenopathy. No pelvic lymphadenopathy. Reproductive: Post hysterectomy.  Adnexa unremarkable Other: No free fluid. Musculoskeletal: No aggressive osseous lesion. IMPRESSION: 1. Resolution of bowel wall thickening of the ascending colon suggests resolution of colonic colitis. No evidence of malignancy. 2. Moderate volume stool in the colon. 3. Post cholecystectomy hysterectomy Electronically Signed   By: Suzy Bouchard M.D.   On: 10/04/2020 17:39   VAS [place] MESENTERIC  Result Date: 09/27/2020 ABDOMINAL VISCERAL Indications: Colitis hx              intermittant abd pain Limitations: Air/bowel gas. Performing Technologist: Concha Norway RVT  Examination Guidelines: A complete evaluation includes B-mode imaging, spectral Doppler, color Doppler, and power Doppler as needed of all accessible portions of each vessel. Bilateral testing is considered an integral part of a complete examination. Limited examinations for reoccurring indications may be performed as noted.  Duplex Findings: +--------------------+--------+--------+------+--------+  Mesenteric           PSV cm/s EDV cm/s Plaque Comments  +--------------------+--------+--------+------+--------+  Aorta Prox              70                               +--------------------+--------+--------+------+--------+  Celiac Artery Origin    57                              +--------------------+--------+--------+------+--------+  SMA Proximal  90                              +--------------------+--------+--------+------+--------+  SMA Mid                 58                              +--------------------+--------+--------+------+--------+  CHA                    131                              +--------------------+--------+--------+------+--------+  Splenic                106                              +--------------------+--------+--------+------+--------+    Summary: Largest Aortic Diameter: 1.7 cm  Mesenteric: Normal Celiac artery , Superior Mesenteric artery, Inferior Mesenteric artery, Splenic artery and Hepatic artery findings.  *See table(s) above for measurements and observations.  Diagnosing physician: Leotis Pain MD  Electronically signed by Leotis Pain MD on 09/27/2020 at 12:26:42 PM.    Final     Recent Labs: Lab Results  Component Value Date   WBC 9.7 08/28/2020   HGB 12.0 08/28/2020   PLT 160 08/28/2020   NA 138 08/28/2020   K 3.7 08/28/2020   CL 103 08/28/2020   CO2 24 08/28/2020   GLUCOSE 177 (H) 08/28/2020   BUN 19 08/28/2020   CREATININE 0.88 08/28/2020   BILITOT 0.7 08/28/2020   ALKPHOS 65 08/28/2020   AST 31 08/28/2020   ALT 24 08/28/2020   PROT 6.6 08/28/2020   ALBUMIN 4.2 08/28/2020   CALCIUM 8.7 (L) 08/28/2020   GFRAA >60 08/28/2020   QFTBGOLDPLUS NEGATIVE 10/07/2019    Speciality Comments: PLQ Eye Exam: 01/06/2020 ABNORMAL @ [facility]. Patient advised to discontinue PLQ at office visit on 01/06/2020.  Prior Therapy: methotrexate/Xeljanz/Actemra (GI upset), Enbrel (injection site reaction) and Orencia/Remicade/Humira/Cimzia/Rituxan/Simponi/Simponi Aria (inadequate response).  Procedures:  No procedures performed Allergies: Actemra [tocilizumab], Prochlorperazine, Ramipril,  Shellfish-derived products, Atorvastatin, Compazine  [prochlorperazine edisylate], Emgality [galcanezumab-gnlm], Etanercept, Infliximab, Iohexol, Orencia [abatacept], Prochlorperazine edisylate, Shellfish allergy, Tofacitinib, Trokendi xr [topiramate er], Amiodarone, and Rituximab   Assessment / Plan:     Visit Diagnoses: Rheumatoid arthritis of multiple sites with negative rheumatoid factor ([place]): She has no synovitis on exam.  She has tenderness of both AC joints, bilateral third MCP joints, and both ankle joints but no inflammation was noted.  Overall she is clinically been doing well on Rinvoq 15 mg 1 tablet by mouth daily and Arava 20 mg 1 tablet by mouth daily.  She is tolerating both medications without any side effects.  She has not missed any doses of Rinvoq or Arava recently.  She has noticed about a 40% improvement in her joint pain and inflammation on combination therapy.  She continues to have significant fatigue and was encouraged to increase her exercise regimen.  She will continue on the current treatment regimen.  She does not need any refills at this time.  She was advised to notify us if she develops increased joint pain or joint swelling.  She will follow-up in the office in 3 months.  High risk medication use - Rinvoq 15 mg 1 tablet by mouth once daily and Arava 20 mg 1 tablet by mouth daily.  Methotrexate/Xeljanz/Actemra (GI upset), Enbrel (injection site reaction) and Orencia/Remicade/Humira/Cimzia/Rituxan/Simponi/Simponi Aria (inadequate response) - Plan: COMPLETE METABOLIC PANEL WITH GFR, CBC with Differential/Platelet, QuantiFERON-TB Gold Plus CBC and CMP were updated on 08/28/20.  She will due to update lab work with November and every 3 months.  TB gold negative 10/07/19.  She is due to update TB gold today and once yearly.  Order for TB gold released. Lipid panel WNL on 05/23/20.  Screening for tuberculosis -TB gold will be checked today. Plan: QuantiFERON-TB Gold Plus  She has  received both covid-19 vaccines and was encouraged to receive the 3rd dose.  She was advised to hold Rinvoq one week after receiving both doses. She was advised to hold tylenol and NSAIDs 24 hour prior to the vaccine. She was advised to notify us or her PCP if she develops the covid-19 infection in order to receive the antibody infusion.  She was encouraged to continue to wear a mask and social distance.  She voiced understanding.   We discussed the importance of holding Rinvoq and Arava if she develops signs or symptoms of an infection and to resume once the infection has cleared.   Psoriasis: She has no active psoriasis at this time.   Contracture of joint of both elbows: Mild. No tenderness or inflammation noted on exam.   Osteopenia of multiple sites - Most recent DEXA on 11/11/19: The BMD measured at Femur Neck Left is 0.901 g/cm2 with a T-score of -1.0-Normal. Repeat DEXA in 5 years.  DDD (degenerative disc disease), lumbar: Chronic pain.  X-rays of the lumbar spine were updated on 02/26/2020 which revealed diffuse degenerative changes.  She is not experiencing any symptoms of radiculopathy at this time.  She takes Flexeril 10 mg 1 tablet by mouth at bedtime for muscle spasms.  DDD (degenerative disc disease), thoracic: No discomfort at this time.  History of vitamin D deficiency - Vitamin D 60.98 on 05/23/20.   Abnormal SPEP: 10/15/19  Other insomnia: She continues to have difficulty sleeping at night.  Good sleep hygiene was discussed.  Other fatigue: She has chronic fatigue secondary to insomnia.  We discussed the importance of increasing her exercise regimen.  History of hypertension: Blood pressure was 122/75 today in the office.  Association of heart disease with rheumatoid arthritis was discussed. Need to monitor blood pressure, cholesterol, and to exercise 30-60 minutes on daily basis was discussed. Poor dental hygiene can be a predisposing factor for rheumatoid arthritis. Good  dental hygiene was discussed.  History of hyperlipidemia: Lipid panel WNL 05/23/20.   Other medical conditions are listed as follows:   History of coronary artery disease  History of Graves' disease  History of gastroesophageal reflux (GERD)  History of diabetes mellitus  History of migraine    Orders: Orders Placed This Encounter  Procedures   COMPLETE METABOLIC PANEL WITH GFR   CBC with Differential/Platelet   QuantiFERON-TB Gold Plus   No orders of the defined types were placed in this encounter.    Follow-Up Instructions: Return in about 3 months (around 01/10/2021) for Rheumatoid arthritis, DDD.   Ofilia Neas, PA-C  Note - This record has been created using Dragon software.  Chart creation errors have been sought, but may not always  have been located. Such creation errors do not reflect on  the standard of medical care.

## 2020-10-10 ENCOUNTER — Ambulatory Visit (INDEPENDENT_AMBULATORY_CARE_PROVIDER_SITE_OTHER): Payer: 59 | Admitting: Physician Assistant

## 2020-10-10 ENCOUNTER — Encounter: Payer: Self-pay | Admitting: Physician Assistant

## 2020-10-10 ENCOUNTER — Other Ambulatory Visit: Payer: Self-pay

## 2020-10-10 VITALS — BP 122/75 | HR 58 | Resp 14 | Ht 63.0 in | Wt 172.8 lb

## 2020-10-10 DIAGNOSIS — Z8679 Personal history of other diseases of the circulatory system: Secondary | ICD-10-CM

## 2020-10-10 DIAGNOSIS — M5136 Other intervertebral disc degeneration, lumbar region: Secondary | ICD-10-CM

## 2020-10-10 DIAGNOSIS — M8589 Other specified disorders of bone density and structure, multiple sites: Secondary | ICD-10-CM | POA: Diagnosis not present

## 2020-10-10 DIAGNOSIS — Z8719 Personal history of other diseases of the digestive system: Secondary | ICD-10-CM

## 2020-10-10 DIAGNOSIS — M24521 Contracture, right elbow: Secondary | ICD-10-CM

## 2020-10-10 DIAGNOSIS — R778 Other specified abnormalities of plasma proteins: Secondary | ICD-10-CM | POA: Diagnosis not present

## 2020-10-10 DIAGNOSIS — Z79899 Other long term (current) drug therapy: Secondary | ICD-10-CM

## 2020-10-10 DIAGNOSIS — M0609 Rheumatoid arthritis without rheumatoid factor, multiple sites: Secondary | ICD-10-CM

## 2020-10-10 DIAGNOSIS — Z111 Encounter for screening for respiratory tuberculosis: Secondary | ICD-10-CM | POA: Diagnosis not present

## 2020-10-10 DIAGNOSIS — M5134 Other intervertebral disc degeneration, thoracic region: Secondary | ICD-10-CM

## 2020-10-10 DIAGNOSIS — G4709 Other insomnia: Secondary | ICD-10-CM

## 2020-10-10 DIAGNOSIS — Z8639 Personal history of other endocrine, nutritional and metabolic disease: Secondary | ICD-10-CM | POA: Diagnosis not present

## 2020-10-10 DIAGNOSIS — Z8669 Personal history of other diseases of the nervous system and sense organs: Secondary | ICD-10-CM

## 2020-10-10 DIAGNOSIS — R5383 Other fatigue: Secondary | ICD-10-CM

## 2020-10-10 DIAGNOSIS — L409 Psoriasis, unspecified: Secondary | ICD-10-CM

## 2020-10-10 DIAGNOSIS — M24522 Contracture, left elbow: Secondary | ICD-10-CM

## 2020-10-10 NOTE — Patient Instructions (Addendum)
Standing Labs We placed an order today for your standing lab work.   Please have your standing labs drawn in November and every 3 months   If possible, please have your labs drawn 2 weeks prior to your appointment so that the provider can discuss your results at your appointment.  We have open lab daily Monday through Thursday from 8:30-12:30 PM and 1:30-4:30 PM and Friday from 8:30-12:30 PM and 1:30-4:00 PM at the office of Dr. Bo Merino, [facility].   Please be advised, patients with office appointments requiring lab work will take precedents over walk-in lab work.  If possible, please come for your lab work on Monday and Friday afternoons, as you may experience shorter wait times. The office is located at [address], [place], [place], [place] [address] No appointment is necessary.   Labs are drawn by [facility]. Please bring your co-pay at the time of your lab draw.  You may receive a bill from [facility] for your lab work.  If you wish to have your labs drawn at another location, please call the office 24 hours in advance to send orders.  If you have any questions regarding directions or hours of operation,  please call [phone].   As a reminder, please drink plenty of water prior to coming for your lab work. Thanks!  COVID-19 vaccine recommendations:   COVID-19 vaccine is recommended for everyone (unless you are allergic to a vaccine component), even if you are on a medication that suppresses your immune system.   If you are on Methotrexate, Cellcept (mycophenolate), Rinvoq, Morrie Sheldon, and Olumiant- hold the medication for 1 week after each vaccine. Hold Methotrexate for 2 weeks after the single dose COVID-19 vaccine.   If you are on Orencia subcutaneous injection - hold medication one week prior to and one week after the first COVID-19 vaccine dose (only).   If you are on Orencia IV infusions- time vaccination administration so that the first COVID-19  vaccination will occur four weeks after the infusion and postpone the subsequent infusion by one week.   If you are on Cyclophosphamide or Rituxan infusions please contact your doctor prior to receiving the COVID-19 vaccine.   Do not take Tylenol or any anti-inflammatory medications (NSAIDs) 24 hours prior to the COVID-19 vaccination.   There is no direct evidence about the efficacy of the COVID-19 vaccine in individuals who are on medications that suppress the immune system.   Even if you are fully vaccinated, and you are on any medications that suppress your immune system, please continue to wear a mask, maintain at least six feet social distance and practice hand hygiene.   If you develop a COVID-19 infection, please contact your PCP or our office to determine if you need antibody infusion.  The booster vaccine is now available for immunocompromised patients. It is advised that if you had Pfizer vaccine you should get [employer] booster.  If you had a Moderna vaccine then you should get a Moderna booster. Johnson and Wynetta Emery does not have a booster vaccine at this time.  Please see the following web sites for updated information.   https://www.[email or web address].org/[email or web address]/0/Files/COVID-19-Vaccination-Patient-Resources.pdf  https://www.[email or web address].org/About-Us/Newsroom/Press-Releases/ID/1159

## 2020-10-12 LAB — QUANTIFERON-TB GOLD PLUS
Mitogen-NIL: >10 IU/mL
NIL: 0.04 IU/mL
QuantiFERON-TB Gold Plus: NEGATIVE
TB1-NIL: 0 IU/mL
TB2-NIL: 0 IU/mL

## 2020-10-12 NOTE — Progress Notes (Signed)
TB gold negative

## 2020-10-13 ENCOUNTER — Ambulatory Visit: Payer: 59 | Admitting: Physician Assistant

## 2020-10-14 ENCOUNTER — Other Ambulatory Visit: Payer: Self-pay | Admitting: Physician Assistant

## 2020-10-14 MED ORDER — CYCLOBENZAPRINE HCL 10 MG PO TABS
10.0000 mg | ORAL_TABLET | Freq: Every day | ORAL | 0 refills | Status: DC
Start: 2020-10-14 — End: 2020-11-10

## 2020-10-14 NOTE — Addendum Note (Signed)
Addended by: Carole Binning on: 10/14/2020 11:47 AM   Modules accepted: Orders

## 2020-10-14 NOTE — Telephone Encounter (Signed)
Last Visit: 10/10/2020 Next Visit: 01/06/2021  Last Fill: 09/13/2020  Okay to refill Cyclobenzaprine?

## 2020-10-17 NOTE — Progress Notes (Signed)
Patient's rheumatoid arthritis is well controlled currently.  She did not have any other features of vasculitis at the last visit.  The diagnosis of vasculitis can only be established by biopsy.  I hope this helps.Thank you, Bo Merino, MD

## 2020-10-18 NOTE — Progress Notes (Signed)
MRA is reasonable if she is a still symptomatic.  I would assume biopsy can only be done during colonoscopy if she is symptomatic as well.  I do not have any further suggestions.

## 2020-10-18 NOTE — Progress Notes (Signed)
Patient is known history of rheumatoid arthritis.  Her rheumatoid arthritis has been better controlled since she has been on Rinvoq.  You may consider obtaining pan-ANCA and sed rate.  Sed rate is going to be nonspecific.  The diagnosis of vasculitis cannot be made without a tissue biopsy.  If patient is asymptomatic you may wait until she became symptomatic so that biopsy can be obtained.

## 2020-10-20 ENCOUNTER — Other Ambulatory Visit: Payer: Self-pay | Admitting: *Deleted

## 2020-10-24 MED FILL — RINVOQ 15 MG TB24: 15 | 30 days supply | Qty: 30 | Fill #0

## 2020-10-26 DIAGNOSIS — E1039 Type 1 diabetes mellitus with other diabetic ophthalmic complication: Secondary | ICD-10-CM | POA: Diagnosis not present

## 2020-10-26 DIAGNOSIS — E1065 Type 1 diabetes mellitus with hyperglycemia: Secondary | ICD-10-CM | POA: Diagnosis not present

## 2020-10-27 ENCOUNTER — Other Ambulatory Visit: Payer: Self-pay | Admitting: Physician Assistant

## 2020-10-27 ENCOUNTER — Other Ambulatory Visit: Payer: Self-pay | Admitting: Rheumatology

## 2020-10-27 NOTE — Telephone Encounter (Signed)
Last Visit: 10/10/2020 Next Visit: 01/06/2021 Labs: 08/28/2020 Glucose 177, Calcium 8.7, RBC 3.81, Hct 34.5, Neutro Abs 8.0  Current Dose per office note on 10/10/2020: Arava 20 mg 1 tablet by mouth daily.  Dx: Rheumatoid arthritis of multiple sites with negative rheumatoid factor   Okay to refill Arava?

## 2020-11-09 ENCOUNTER — Other Ambulatory Visit: Payer: Self-pay | Admitting: Physician Assistant

## 2020-11-10 ENCOUNTER — Other Ambulatory Visit: Payer: Self-pay | Admitting: Physician Assistant

## 2020-11-10 NOTE — Telephone Encounter (Signed)
Last Visit: 10/10/2020 Next Visit: 01/06/2021  Last Fill: 10/14/2020  Okay to refill Cyclobenzaprine?

## 2020-11-10 NOTE — Telephone Encounter (Signed)
Received notification from [facility] regarding a prior authorization for [facility]. Authorization has been APPROVED from 11/09/20 to 11/08/21.   Authorization # [id number]

## 2020-11-11 ENCOUNTER — Other Ambulatory Visit: Payer: Self-pay

## 2020-11-11 DIAGNOSIS — R109 Unspecified abdominal pain: Secondary | ICD-10-CM

## 2020-11-11 DIAGNOSIS — R9389 Abnormal findings on diagnostic imaging of other specified body structures: Secondary | ICD-10-CM

## 2020-11-14 ENCOUNTER — Other Ambulatory Visit: Payer: Self-pay | Admitting: *Deleted

## 2020-11-21 MED FILL — RINVOQ 15 MG TB24: 15 | 30 days supply | Qty: 30 | Fill #1

## 2020-12-01 ENCOUNTER — Encounter: Payer: Self-pay | Admitting: Neurology

## 2020-12-01 ENCOUNTER — Ambulatory Visit: Payer: 59 | Admitting: Neurology

## 2020-12-01 VITALS — BP 126/82 | HR 65 | Ht 63.0 in | Wt 176.6 lb

## 2020-12-01 DIAGNOSIS — G43011 Migraine without aura, intractable, with status migrainosus: Secondary | ICD-10-CM | POA: Diagnosis not present

## 2020-12-01 DIAGNOSIS — R0683 Snoring: Secondary | ICD-10-CM

## 2020-12-01 NOTE — Patient Instructions (Signed)
Let's send for sleep evaluation  Depending on results, may consider Botox in the future See you back in 6 months

## 2020-12-01 NOTE — Progress Notes (Signed)
PATIENT: [name] DOB: [birth year]  REASON FOR VISIT: follow up HISTORY FROM: patient  HISTORY OF PRESENT ILLNESS: Today 12/01/20  HISTORY Lisa Ruben Jonesis a 54 year old female, seen in request byher primary care PA Marcos Eke evaluation of chronic migraine headache, initial evaluation was on October 07, 2019.  I have reviewed and summarized the referring note from the referring physician.She had past medical history of insulin-dependent diabetes, hypertension, hyperlipidemia, coronary artery disease, rheumatoid arthritis, psoriatic arthritis, on polypharmacy treatment  She reported a history of migraine headaches since teenager, her typical migraine bilateral retro-orbital area vertex area severe pounding headache with associated light noise sensitivity, nauseous, lasting for couple hours, recently she been having typical migraine once a week, but in between, she also has similar but milder degree of headaches, she often take ibuprofen as needed for migraine, not a candidate for triptan because of her chronic artery disease. Previously tried Topamax as preventive medications, she complains of side effect of mental slowing, Topamax was helpful. 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 She denies bilateral feet paresthesia,  Update January 13, 2020 BW:[facility] last seen by [name], was started on lamotrigine, titrating to 100 mg twice a day, Cambia as needed.She complained of joint pain with the medication, also has history of rheumatoid arthritis, psoriatic arthritis. She decided to stay on Lamictal.She has previously tried [employer] (had reaction of hives), Topamax, but hadside effect of mental slowing.  She has not found Lamictal to be beneficial, reduce the dose to 50 mg twice a day, reports side effect of dizziness,stumbling, cannot find her words. She says she is having 5 headaches a week, mild to moderate, only took Cambia once, was not  helpful. She usually does not take any medication, does not miss work as result of headache. With headache reports phonophobia, some nausea. Her primary doctor mention the idea of sleep study,but infrequent snoring, no daytime drowsiness, patient does not think needed at this time.  Update May 12, 2020 SS: When last seen she was started on Effexor 37.5 mg at bedtime, stopped Lamictal.  She was unable to tolerate Effexor, due to elevated blood pressure.  She is no longer taking NSAIDs, as result of colitis.  She continues to report about 5 dull headaches a week, about 50% are  morning headaches.  The headaches are not significant enough to put her down, some may last all day.  She does not miss work.  She works as a Marine scientist.  In general, she does not sleep well, she usually gets up twice a night, she does snore (reported by her children, she sleeps alone with cats), she feels tired during the day.  She has had a recent bout of shingles to her right elbow. In the last year, has had 2 debilitating migraines, missing work.  With her headaches, she may have some photophobia, nausea, usually frontal, retro orbital. PCP has mentioned the idea of needing sleep study.   Update December 01, 2020 TD:[id number] to report on average 4-5 dull headaches a week, 1-2 or morning headache, with photophobia, and some nausea (related to colitis?). Most seem to develop in mid afternoon.  Cannot take NSAIDs due to colitis, doesn't take any abortive medicine.  She is able to continue with activities.  She works as a Marine scientist in endoscopy.  Sleeps fairly well, occasional snoring, does have daytime fatigue.  Has 1 severe migraine every 3 months.  In August, had a bout of colitis.  Did not have  sleep evaluation after last evaluation. Has had normal ophthalmology evaluation.  REVIEW OF SYSTEMS: Out of a complete 14 system review of symptoms, the patient complains only of the following symptoms, and all other reviewed systems are  negative.  Headache  ALLERGIES: Allergies  Allergen Reactions   Actemra [Tocilizumab]    Prochlorperazine Rash    Neuro problems per pt   Ramipril Swelling, Rash and Other (See Comments)   Shellfish-Derived Products Swelling    Shrimp    Atorvastatin Rash    Elevated LFT's   Compazine  [Prochlorperazine Edisylate] Other (See Comments)    Neurological reaction   Emgality [Galcanezumab-Gnlm] Hives and Swelling   Etanercept Swelling and Rash   Infliximab Rash   Iohexol     Iv contrast dye -rash all over   Orencia [Abatacept] Rash   Prochlorperazine Edisylate     unknown   Shellfish Allergy Swelling    Shrimp(Facial swelling)   Tofacitinib Rash and Other (See Comments)    Severe abdominal pain    Trokendi [employer]]     [employer], memory issues, word finding issues   Amiodarone Nausea Only   Rituximab Rash    Causes a rash    HOME MEDICATIONS: Outpatient Medications Prior to Visit  Medication Sig Dispense Refill   amLODipine (NORVASC) 5 MG tablet Take 0.5 tablets (2.5 mg total) by mouth daily. 45 tablet 3   aspirin EC 81 MG tablet Take 81 mg by mouth daily.     CHELATED MAGNESIUM PO Take 250 mg by mouth daily.     clopidogrel (PLAVIX) 75 MG tablet TAKE 1 TABLET BY MOUTH ONCE DAILY 90 tablet 3   cyclobenzaprine (FLEXERIL) 10 MG tablet TAKE 1 TABLET BY MOUTH AT BEDTIME 30 tablet 0   dicyclomine (BENTYL) 20 MG tablet Take 1 tablet (20 mg total) by mouth 3 (three) times daily as needed for spasms (abdominal pain). 270 tablet 0   EPINEPHrine 0.3 mg/0.3 mL IJ SOAJ injection Inject 0.3 mLs (0.3 mg total) into the muscle as needed for anaphylaxis. 1 Device prn   folic acid (FOLVITE) 1 MG tablet .AKE 2 TABLETS (2 MG TOTAL) BY MOUTH DAILY. 180 tablet 1   furosemide (LASIX) 40 MG tablet Take 1 tablet (40 mg total) by mouth daily as needed for edema. (Patient taking differently: Take 40 mg by mouth daily. ) 90 tablet 3   insulin lispro (HUMALOG)  100 UNIT/ML injection Inject 80-90 Units into the skin continuous. FOR USE IN INSULIN PUMP. TOTAL DAILY INSULIN DOSE = UP TO 90 UNITS.     isosorbide mononitrate (IMDUR) 60 MG 24 hr tablet TAKE 1 TABLET (60 MG TOTAL) BY MOUTH DAILY. 90 tablet 3   Krill Oil 500 MG CAPS Take 500 mg by mouth daily.     leflunomide (ARAVA) 20 MG tablet TAKE 1 TABLET BY MOUTH DAILY 90 tablet 0   linaclotide (LINZESS) 290 MCG CAPS capsule Take 1 capsule (290 mcg total) by mouth daily before breakfast. 90 capsule 3   losartan (COZAAR) 100 MG tablet Take 1 tablet (100 mg total) by mouth daily. 30 tablet 11   metoprolol tartrate (LOPRESSOR) 50 MG tablet TAKE 1 TABLET BY MOUTH TWICE DAILY 180 tablet 3   omeprazole (PRILOSEC) 40 MG capsule      ondansetron (ZOFRAN-ODT) 4 MG disintegrating tablet Take 4 mg by mouth every 8 (eight) hours as needed.     ONE TOUCH ULTRA TEST test strip   1   pantoprazole (PROTONIX) 40 MG  tablet Take 1 tablet (40 mg total) by mouth daily. 30 tablet 11   potassium chloride (KLOR-CON) 10 MEQ tablet Take 1 tablet (10 mEq total) by mouth daily. 90 tablet 3   promethazine (PHENERGAN) 25 MG tablet Take 0.5-1 tablets (12.5-25 mg total) by mouth 2 (two) times daily as needed for nausea or vomiting. 60 tablet 2   rosuvastatin (CRESTOR) 20 MG tablet Take 1 tablet (20 mg total) by mouth daily. 90 tablet 3   Upadacitinib ER (RINVOQ) 15 MG TB24 Take 15 mg by mouth daily. 90 tablet 0   nitroGLYCERIN (NITROSTAT) 0.4 MG SL tablet Place 1 tablet (0.4 mg total) under the tongue every 5 (five) minutes as needed. 25 tablet 3   No facility-administered medications prior to visit.    PAST MEDICAL HISTORY: Past Medical History:  Diagnosis Date   ACUT MI ANTEROLAT WALL SUBSQT EPIS CARE    Acute maxillary sinusitis    ALLERGIC RHINITIS, SEASONAL    ARTHRITIS, RHEUMATOID    shoulders and hands Enbrel>leg swelling Dr. Estanislado Pandy   Atrial fibrillation [facility])    a. after CABG.   Back pain     Bruxism (teeth grinding)    CAD, ARTERY BYPASS GRAFT    a. DES to RCA in 2010 then LAD occlusion s/p CABG 3 06/07/2009 with LIMA to LAD, reverse SVG to D1, reverse SVG to distal RCA. b. Cath 05/08/2016 slightly hypodense region in the intermediate branch, however she had excellent flow, FFR was normal. Vein graft to PDA and the posterolateral branch is patent, patent LIMA to LAD, occluded SVG to diagonal.   CARPAL TUNNEL SYNDROME, BILATERAL    CHOLELITHIASIS    Contrast media allergy    DERMATITIS, ALLERGIC    DIABETES MELLITUS, TYPE I    on insulin pump dx'ed age 23 y.o    DIABETIC  RETINOPATHY    Hiatal hernia    HYPERLIPIDEMIA-MIXED    HYPERTHYROIDISM    Dr. Dollene Cleveland   Insulin pump in place    MIGRAINE W/O AURA W/INTRACT W/STATUS MIGRAINOSUS 02/19/2008   Psoriasis    SINUS TACHYCARDIA 11/08/2010   SVT (supraventricular tachycardia) (HCC)    after s/p CABG   TRIGGER FINGER    all fingers b/l hands    URI     PAST SURGICAL HISTORY: Past Surgical History:  Procedure Laterality Date   ABDOMINAL HYSTERECTOMY     endometriomas b/l; total ? cervix removed; no h/o abnormal pap   Caesarean section     CARDIAC CATHETERIZATION N/A 05/08/2016   Procedure: Left Heart Cath and Cors/Grafts Angiography;  Surgeon: Burnell Blanks, MD;  Location: [facility];  Service: Cardiovascular;  Laterality: N/A;   CARPAL TUNNEL RELEASE     b/l    CATARACT EXTRACTION, BILATERAL     CHOLECYSTECTOMY     COLONOSCOPY WITH PROPOFOL N/A 03/25/2020   Procedure: COLONOSCOPY WITH PROPOFOL;  Surgeon: Jonathon Bellows, MD;  Location: [facility] ENDOSCOPY;  Service: Gastroenterology;  Laterality: N/A;   CORONARY ARTERY BYPASS GRAFT     ESOPHAGOGASTRODUODENOSCOPY (EGD) WITH PROPOFOL N/A 03/25/2020   Procedure: ESOPHAGOGASTRODUODENOSCOPY (EGD) WITH PROPOFOL;  Surgeon: Jonathon Bellows, MD;  Location: [facility] ENDOSCOPY;  Service: Gastroenterology;  Laterality: N/A;   EYE SURGERY     laser x 2  for retinopathy    LEFT HEART CATH AND CORS/GRAFTS ANGIOGRAPHY N/A 02/22/2020   Procedure: LEFT HEART CATH AND CORS/GRAFTS ANGIOGRAPHY;  Surgeon: Burnell Blanks, MD;  Location: [facility];  Service: Cardiovascular;  Laterality: N/A;  TRIGGER FINGER RELEASE     b/l fingers all    VITRECTOMY     b/l     FAMILY HISTORY: Family History  Problem Relation Age of Onset   Depression Mother    Anxiety disorder Mother    Colon polyps Father    Diabetes Father        2   Hypertension Father    Parkinson's disease Father    Diabetes Other    Heart disease Other    Hypertension Other    Hyperlipidemia Other    Depression Other    Migraines Other    Stroke Paternal Grandmother    Heart attack Neg Hx    Colon cancer Neg Hx    Stomach cancer Neg Hx    Esophageal cancer Neg Hx     SOCIAL HISTORY: Social History   Socioeconomic History   Marital status: Divorced    Spouse name: Not on file   Number of children: Not on file   Years of education: Not on file   Highest education level: Not on file  Occupational History   Not on file  Tobacco Use   Smoking status: Never Smoker   Smokeless tobacco: Never Used  Vaping Use   Vaping Use: Never used  Substance and Sexual Activity   Alcohol use: Yes    Comment: rarely 1 every 6 months   Drug use: No   Sexual activity: Not Currently    Partners: Male    Birth control/protection: None  Other Topics Concern   Not on file  Social History Narrative   Divorced. Has 2 kids(fraternal twins) daughters age 60. Works at [employer], Never smoked, denies ETOH, no drugs. Drinks diet coke. No exercise.       DPR daughters [name] and Shye Doty twins          Social Determinants of Health   Financial Resource Strain:    Difficulty of Paying Living Expenses: Not on file  Food Insecurity:    Worried About Charity fundraiser in the Last Year: Not on file   [employer] of Food in  the Last Year: Not on file  Transportation Needs:    Lack of Transportation (Medical): Not on file   Lack of Transportation (Non-Medical): Not on file  Physical Activity:    Days of Exercise per Week: Not on file   Minutes of Exercise per Session: Not on file  Stress:    Feeling of Stress : Not on file  Social Connections:    Frequency of Communication with Friends and Family: Not on file   Frequency of Social Gatherings with Friends and Family: Not on file   Attends Religious Services: Not on file   Active Member of Clubs or Organizations: Not on file   Attends Archivist Meetings: Not on file   Marital Status: Not on file  Intimate Partner Violence:    Fear of Current or Ex-Partner: Not on file   Emotionally Abused: Not on file   Physically Abused: Not on file   Sexually Abused: Not on file   PHYSICAL EXAM  Vitals:   12/01/20 1505  BP: 126/82  Pulse: 65  Weight: 176 lb 9.6 oz (80.1 kg)  Height: 5\' 3"  (1.6 m)   Body mass index is 31.28 kg/m.  Generalized: Well developed, in no acute distress   Neurological examination  Mentation: Alert oriented to time, place, history taking. Follows all commands speech and language fluent Cranial nerve  II-XII: Pupils were equal round reactive to light. Extraocular movements were full, visual field were full on confrontational test. Facial sensation and strength were normal. Head turning and shoulder shrug  were normal and symmetric. Motor: The motor testing reveals 5 over 5 strength of all 4 extremities. Good symmetric motor tone is noted throughout.  Sensory: Sensory testing is intact to soft touch on all 4 extremities. No evidence of extinction is noted.  Coordination: Cerebellar testing reveals good finger-nose-finger and heel-to-shin bilaterally.  Gait and station: Gait is normal. Reflexes: Deep tendon reflexes are symmetric and normal bilaterally.   DIAGNOSTIC DATA (LABS, IMAGING, TESTING) - I reviewed  patient records, labs, notes, testing and imaging myself where available.  Lab Results  Component Value Date   WBC 9.7 08/28/2020   HGB 12.0 08/28/2020   HCT 34.5 (L) 08/28/2020   MCV 90.6 08/28/2020   PLT 160 08/28/2020      Component Value Date/Time   NA 138 08/28/2020 0955   NA 140 02/15/2020 1656   K 3.7 08/28/2020 0955   CL 103 08/28/2020 0955   CO2 24 08/28/2020 0955   GLUCOSE 177 (H) 08/28/2020 0955   BUN 19 08/28/2020 0955   BUN 9 02/15/2020 1656   CREATININE 0.88 08/28/2020 0955   CREATININE 0.99 07/14/2020 0906   CALCIUM 8.7 (L) 08/28/2020 0955   PROT 6.6 08/28/2020 0955   ALBUMIN 4.2 08/28/2020 0955   AST 31 08/28/2020 0955   ALT 24 08/28/2020 0955   ALKPHOS 65 08/28/2020 0955   BILITOT 0.7 08/28/2020 0955   GFRNONAA >60 08/28/2020 0955   GFRNONAA 66 07/14/2020 0906   GFRAA >60 08/28/2020 0955   GFRAA 76 07/14/2020 0906   Lab Results  Component Value Date   CHOL 144 05/23/2020   HDL 57.70 05/23/2020   LDLCALC 61 05/23/2020   TRIG 123.0 05/23/2020   CHOLHDL 2 05/23/2020   Lab Results  Component Value Date   HGBA1C 6.1 05/23/2020   Lab Results  Component Value Date   VITAMINB12 1,222 (H) 08/12/2019   Lab Results  Component Value Date   TSH 0.77 05/23/2020   ASSESSMENT AND PLAN 54 y.o. year old female  has a past medical history of ACUT MI ANTEROLAT WALL SUBSQT EPIS CARE, Acute maxillary sinusitis, ALLERGIC RHINITIS, SEASONAL, ARTHRITIS, RHEUMATOID, Atrial fibrillation (HCC), Back pain, Bruxism (teeth grinding), CAD, ARTERY BYPASS GRAFT, CARPAL TUNNEL SYNDROME, BILATERAL, CHOLELITHIASIS, Contrast media allergy, DERMATITIS, ALLERGIC, DIABETES MELLITUS, TYPE I, DIABETIC  RETINOPATHY, Hiatal hernia, HYPERLIPIDEMIA-MIXED, HYPERTHYROIDISM, Insulin pump in place, MIGRAINE W/O AURA W/INTRACT W/STATUS MIGRAINOSUS (02/19/2008), Psoriasis, SINUS TACHYCARDIA (11/08/2010), SVT (supraventricular tachycardia) ([place]), TRIGGER FINGER, and URI. here with:  1.  Chronic  migraine headache 2.  Snoring, daytime fatigue -Has previously tried [employer] (had hives, local reaction to injection site), Topamax, Lamictal, Trokendi, amitriptyline, is on metoprolol, had high blood pressure with Effexor XR-stopped, is no longer able to take NSAIDs due to colitis, d/c Cambia, not a candidate for triptans due to HTN, CAD -Will again sent for sleep study, consider possible OSA?  BMI 31, history significant for HTN, CAD, DM -If sleep study, is unremarkable, consider Botox trial in the future (dull 4-5 headaches a week, do have migraine features) -Follow-up in 6 months or sooner if needed, will await sleep study if any other reason contributing to headaches, PCP wanted sleep study as well   I spent 20 minutes of face-to-face and non-face-to-face time with patient.  This included previsit chart review, lab review, study review, order entry, electronic  health record documentation, patient education.  Butler Denmark, AGNP-C, DNP 12/01/2020, 4:22 PM [facility] [address] [place], [place] [address] [phone]

## 2020-12-06 ENCOUNTER — Telehealth: Payer: Self-pay | Admitting: Cardiovascular Disease

## 2020-12-06 NOTE — Telephone Encounter (Signed)
Pt c/o swelling: STAT is pt has developed SOB within 24 hours  1) How much weight have you gained and in what time span? Not sure   2) If swelling, where is the swelling located? Legs   3) Are you currently taking a fluid pill? Yes   4) Are you currently SOB? Upon exertion   5) Do you have a log of your daily weights (if so, list)? No   6) Have you gained 3 pounds in a day or 5 pounds in a week? No maybe a pound or two in a week   7) Have you traveled recently? No    Has been occurring for three weeks, but has began waking up with it for the past week. Please advise.

## 2020-12-06 NOTE — Telephone Encounter (Signed)
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 She states her feet/ankles/legs up to knees have about 3+ edema.  While her shoes still fit, her legs feel tight and heavy. She has been taking her PRN Lasix and potassium daily since this summer. She has not changed her diet and has not had increased salt recently.  She requests an afternoon appointment for evaluation due to her work schedule. Scheduled her for appointment with Kerin Ransom this Friday, 12/10. She understands her appointment will be at the [place] office. The patient was grateful for call and agrees with plan.

## 2020-12-06 NOTE — Addendum Note (Signed)
Addended by: Dorethea Clan on: 12/06/2020 09:21 AM   Modules accepted: Orders

## 2020-12-09 ENCOUNTER — Other Ambulatory Visit: Payer: Self-pay

## 2020-12-09 ENCOUNTER — Encounter: Payer: Self-pay | Admitting: Cardiology

## 2020-12-09 ENCOUNTER — Ambulatory Visit
Admission: RE | Admit: 2020-12-09 | Discharge: 2020-12-09 | Disposition: A | Discharge status: Home / Self Care | Payer: 59 | Source: Ambulatory Visit | Attending: Gastroenterology | Admitting: Gastroenterology

## 2020-12-09 ENCOUNTER — Ambulatory Visit: Payer: 59 | Admitting: Cardiology

## 2020-12-09 VITALS — BP 112/62 | HR 80 | Ht 63.0 in | Wt 176.2 lb

## 2020-12-09 DIAGNOSIS — I5031 Acute diastolic (congestive) heart failure: Secondary | ICD-10-CM | POA: Insufficient documentation

## 2020-12-09 DIAGNOSIS — E103599 Type 1 diabetes mellitus with proliferative diabetic retinopathy without macular edema, unspecified eye: Secondary | ICD-10-CM

## 2020-12-09 DIAGNOSIS — Z794 Long term (current) use of insulin: Secondary | ICD-10-CM

## 2020-12-09 DIAGNOSIS — E0500 Thyrotoxicosis with diffuse goiter without thyrotoxic crisis or storm: Secondary | ICD-10-CM

## 2020-12-09 DIAGNOSIS — I10 Essential (primary) hypertension: Secondary | ICD-10-CM

## 2020-12-09 DIAGNOSIS — E785 Hyperlipidemia, unspecified: Secondary | ICD-10-CM | POA: Diagnosis not present

## 2020-12-09 DIAGNOSIS — G43011 Migraine without aura, intractable, with status migrainosus: Secondary | ICD-10-CM | POA: Diagnosis not present

## 2020-12-09 DIAGNOSIS — K529 Noninfective gastroenteritis and colitis, unspecified: Secondary | ICD-10-CM | POA: Diagnosis not present

## 2020-12-09 DIAGNOSIS — R109 Unspecified abdominal pain: Secondary | ICD-10-CM | POA: Diagnosis not present

## 2020-12-09 DIAGNOSIS — R9389 Abnormal findings on diagnostic imaging of other specified body structures: Secondary | ICD-10-CM | POA: Insufficient documentation

## 2020-12-09 DIAGNOSIS — M0609 Rheumatoid arthritis without rheumatoid factor, multiple sites: Secondary | ICD-10-CM | POA: Diagnosis not present

## 2020-12-09 DIAGNOSIS — E119 Type 2 diabetes mellitus without complications: Secondary | ICD-10-CM | POA: Diagnosis not present

## 2020-12-09 DIAGNOSIS — I708 Atherosclerosis of other arteries: Secondary | ICD-10-CM | POA: Diagnosis not present

## 2020-12-09 DIAGNOSIS — Z951 Presence of aortocoronary bypass graft: Secondary | ICD-10-CM

## 2020-12-09 MED ORDER — GADOBUTROL 1 MMOL/ML IV SOLN
7.5000 mL | Freq: Once | INTRAVENOUS | Status: AC | PRN
  Administered 2020-12-09: 7.5 mL via INTRAVENOUS

## 2020-12-09 NOTE — Assessment & Plan Note (Signed)
Endocrinology following- if she is now hypothyroid that could contribute to her LE edema as well.

## 2020-12-09 NOTE — Progress Notes (Signed)
Cardiology Office Note:    Date:  12/09/2020   ID:  Lisa, [name] 07/21/1966, MRN [id number]  PCP:  McLean-Scocuzza, Nino Glow, MD  Cardiologist:  Sherren Mocha, MD  Electrophysiologist:  None   Referring MD: McLean-Scocuzza, [name] *   No chief complaint on file. LE edema  History of Present Illness:    [name] is a 54 y.o. female with a hx of type 1 diabetes.  She had bypass surgery in 2010 with an LIMA to LAD, SVG to RCA, and SVG to the diagonal.  Subsequent catheterization in 2017 and again in February 2021 showed patent grafts with a chronically occluded vein graft to the diagonal.  Medical therapy is recommended.  Echocardiogram done in March 2020 showed normal LV function.  She did have evidence of impaired relaxation but no LVH.  Other medical issues include treated hypertension, dyslipidemia, rheumatoid arthritis, Graves' disease, chronic migraines, and colitis.  Patient seen in the office today after she called complaining of lower extremity edema.  She tells me she has noted this for the last couple weeks.  Her edema is mainly in her shins, not necessarily in her feet.  Says she feels like her legs are "heavy".  She is not had orthopnea or increasing dyspnea on exertion.  Past Medical History:  Diagnosis Date  . ACUT MI ANTEROLAT WALL SUBSQT EPIS CARE   . Acute maxillary sinusitis   . ALLERGIC RHINITIS, SEASONAL   . ARTHRITIS, RHEUMATOID    shoulders and hands Enbrel>leg swelling Dr. Estanislado Pandy  . Atrial fibrillation ([place])    a. after CABG.  . Back pain   . Bruxism (teeth grinding)   . CAD, ARTERY BYPASS GRAFT    a. DES to RCA in 2010 then LAD occlusion s/p CABG 3 06/07/2009 with LIMA to LAD, reverse SVG to D1, reverse SVG to distal RCA. b. Cath 05/08/2016 slightly hypodense region in the intermediate branch, however she had excellent flow, FFR was normal. Vein graft to PDA and the posterolateral branch is patent, patent LIMA to LAD, occluded SVG to diagonal.  .  CARPAL TUNNEL SYNDROME, BILATERAL   . CHOLELITHIASIS   . Contrast media allergy   . DERMATITIS, ALLERGIC   . DIABETES MELLITUS, TYPE I    on insulin pump dx'ed age 53 y.o   . DIABETIC  RETINOPATHY   . Hiatal hernia   . HYPERLIPIDEMIA-MIXED   . HYPERTHYROIDISM    Dr. Dollene Cleveland  . Insulin pump in place   . MIGRAINE W/O AURA W/INTRACT W/STATUS MIGRAINOSUS 02/19/2008  . Psoriasis   . SINUS TACHYCARDIA 11/08/2010  . SVT (supraventricular tachycardia) (HCC)    after s/p CABG  . TRIGGER FINGER    all fingers b/l hands   . URI     Past Surgical History:  Procedure Laterality Date  . ABDOMINAL HYSTERECTOMY     endometriomas b/l; total ? cervix removed; no h/o abnormal pap  . Caesarean section    . CARDIAC CATHETERIZATION N/A 05/08/2016   Procedure: Left Heart Cath and Cors/Grafts Angiography;  Surgeon: Burnell Blanks, MD;  Location: [facility];  Service: Cardiovascular;  Laterality: N/A;  . CARPAL TUNNEL RELEASE     b/l   . CATARACT EXTRACTION, BILATERAL    . CHOLECYSTECTOMY    . COLONOSCOPY WITH PROPOFOL N/A 03/25/2020   Procedure: COLONOSCOPY WITH PROPOFOL;  Surgeon: Jonathon Bellows, MD;  Location: [facility] ENDOSCOPY;  Service: Gastroenterology;  Laterality: N/A;  . CORONARY ARTERY BYPASS GRAFT    .  ESOPHAGOGASTRODUODENOSCOPY (EGD) WITH PROPOFOL N/A 03/25/2020   Procedure: ESOPHAGOGASTRODUODENOSCOPY (EGD) WITH PROPOFOL;  Surgeon: Jonathon Bellows, MD;  Location: [facility] ENDOSCOPY;  Service: Gastroenterology;  Laterality: N/A;  . EYE SURGERY     laser x 2 for retinopathy   . LEFT HEART CATH AND CORS/GRAFTS ANGIOGRAPHY N/A 02/22/2020   Procedure: LEFT HEART CATH AND CORS/GRAFTS ANGIOGRAPHY;  Surgeon: Burnell Blanks, MD;  Location: [place];  Service: Cardiovascular;  Laterality: N/A;  . TRIGGER FINGER RELEASE     b/l fingers all   . VITRECTOMY     b/l     Current Medications: Current Meds  Medication Sig  . amLODipine (NORVASC) 5 MG tablet Take 0.5 tablets  (2.5 mg total) by mouth daily.  Marland Kitchen aspirin EC 81 MG tablet Take 81 mg by mouth daily.  . CHELATED MAGNESIUM PO Take 250 mg by mouth daily.  . clopidogrel (PLAVIX) 75 MG tablet TAKE 1 TABLET BY MOUTH ONCE DAILY  . cyclobenzaprine (FLEXERIL) 10 MG tablet TAKE 1 TABLET BY MOUTH AT BEDTIME  . dicyclomine (BENTYL) 20 MG tablet Take 1 tablet (20 mg total) by mouth 3 (three) times daily as needed for spasms (abdominal pain).  Marland Kitchen EPINEPHrine 0.3 mg/0.3 mL IJ SOAJ injection Inject 0.3 mLs (0.3 mg total) into the muscle as needed for anaphylaxis.  . folic acid (FOLVITE) 1 MG tablet .AKE 2 TABLETS (2 MG TOTAL) BY MOUTH DAILY.  . furosemide (LASIX) 40 MG tablet Take 1 tablet (40 mg total) by mouth daily as needed for edema. (Patient taking differently: Take 40 mg by mouth daily.)  . insulin lispro (HUMALOG) 100 UNIT/ML injection Inject 80-90 Units into the skin continuous. FOR USE IN INSULIN PUMP. TOTAL DAILY INSULIN DOSE = UP TO 90 UNITS.  Marland Kitchen isosorbide mononitrate (IMDUR) 60 MG 24 hr tablet TAKE 1 TABLET (60 MG TOTAL) BY MOUTH DAILY.  Marland Kitchen Krill Oil 500 MG CAPS Take 500 mg by mouth daily.  Marland Kitchen leflunomide (ARAVA) 20 MG tablet TAKE 1 TABLET BY MOUTH DAILY  . linaclotide (LINZESS) 290 MCG CAPS capsule Take 1 capsule (290 mcg total) by mouth daily before breakfast.  . losartan (COZAAR) 100 MG tablet Take 1 tablet (100 mg total) by mouth daily.  . metoprolol tartrate (LOPRESSOR) 50 MG tablet TAKE 1 TABLET BY MOUTH TWICE DAILY  . omeprazole (PRILOSEC) 40 MG capsule   . ondansetron (ZOFRAN-ODT) 4 MG disintegrating tablet Take 4 mg by mouth every 8 (eight) hours as needed.  . ONE TOUCH ULTRA TEST test strip   . pantoprazole (PROTONIX) 40 MG tablet Take 1 tablet (40 mg total) by mouth daily.  . potassium chloride (KLOR-CON) 10 MEQ tablet Take 1 tablet (10 mEq total) by mouth daily.  . promethazine (PHENERGAN) 25 MG tablet Take 0.5-1 tablets (12.5-25 mg total) by mouth 2 (two) times daily as needed for nausea or vomiting.   . rosuvastatin (CRESTOR) 20 MG tablet Take 1 tablet (20 mg total) by mouth daily.  Marland Kitchen Upadacitinib ER (RINVOQ) 15 MG TB24 Take 15 mg by mouth daily.     Allergies:   Actemra [tocilizumab], Prochlorperazine, Ramipril, Shellfish-derived products, Atorvastatin, Compazine  [prochlorperazine edisylate], Emgality [galcanezumab-gnlm], Etanercept, Infliximab, Iohexol, Orencia [abatacept], Prochlorperazine edisylate, Shellfish allergy, Tofacitinib, Trokendi xr [topiramate er], Amiodarone, and Rituximab   Social History   Socioeconomic History  . Marital status: Divorced    Spouse name: Not on file  . Number of children: Not on file  . Years of education: Not on file  . Highest education  level: Not on file  Occupational History  . Not on file  Tobacco Use  . Smoking status: Never Smoker  . Smokeless tobacco: Never Used  Vaping Use  . Vaping Use: Never used  Substance and Sexual Activity  . Alcohol use: Yes    Comment: rarely 1 every 6 months  . Drug use: No  . Sexual activity: Not Currently    Partners: Male    Birth control/protection: None  Other Topics Concern  . Not on file  Social History Narrative   Divorced. Has 2 kids(fraternal twins) daughters age 65. Works at [employer], Never smoked, denies ETOH, no drugs. Drinks diet coke. No exercise.       DPR daughters [name] and Clotile Whittington twins          Social Determinants of Health   Financial Resource Strain: Not on file  Food Insecurity: Not on file  Transportation Needs: Not on file  Physical Activity: Not on file  Stress: Not on file  Social Connections: Not on file     Family History: The patient's family history includes Anxiety disorder in her mother; Colon polyps in her father; Depression in her mother and another family member; Diabetes in her father and another family member; Heart disease in an other family member; Hyperlipidemia in an other family member; Hypertension in her father and another family  member; Migraines in an other family member; Parkinson's disease in her father; Stroke in her paternal grandmother. There is no history of Heart attack, Colon cancer, Stomach cancer, or Esophageal cancer.  ROS:   Please see the history of present illness. Seen by Dr Lucky Cowboy with Vascular Surgery for possible ischemic colitis.  For now no further work up.   She is scheduled for a sleep study in Jan.     All other systems reviewed and are negative.  EKGs/Labs/Other Studies Reviewed:    The following studies were reviewed today: Echo March 2020- IMPRESSIONS    1. The left ventricle has normal systolic function with an ejection  fraction of 60-65%. The cavity size was normal. Left ventricular diastolic  Doppler parameters are consistent with impaired relaxation. Indeterminate  filling pressures.  2. The right ventricle has normal systolic function. The cavity was  normal. There is no increase in right ventricular wall thickness.   EKG:  EKG is ordered today.  The ekg ordered today demonstrates NSR, HR 80, poor anterior RW  Recent Labs: 05/23/2020: TSH 0.77 08/28/2020: ALT 24; BUN 19; Creatinine, Ser 0.88; Hemoglobin 12.0; Platelets 160; Potassium 3.7; Sodium 138  Recent Lipid Panel    Component Value Date/Time   CHOL 144 05/23/2020 0847   TRIG 123.0 05/23/2020 0847   HDL 57.70 05/23/2020 0847   CHOLHDL 2 05/23/2020 0847   VLDL 24.6 05/23/2020 0847   LDLCALC 61 05/23/2020 0847    Physical Exam:    VS:  BP 112/62   Pulse 80   Ht 5\' 3"  (1.6 m)   Wt 176 lb 3.2 oz (79.9 kg)   BMI 31.21 kg/m     Wt Readings from Last 3 Encounters:  12/09/20 176 lb 3.2 oz (79.9 kg)  12/01/20 176 lb 9.6 oz (80.1 kg)  10/10/20 172 lb 12.8 oz (78.4 kg)     GEN: Overweight female, well developed in no acute distress HEENT: Normal NECK: No JVD; No carotid bruits CARDIAC: RRR, no murmurs, rubs, gallops RESPIRATORY:  Clear to auscultation without rales, wheezing or rhonchi  ABDOMEN: Soft,  non-tender, non-distended MUSCULOSKELETAL:  Trace tibial edema; No deformity  SKIN: Warm and dry NEUROLOGIC:  Alert and oriented x 3 PSYCHIATRIC:  Normal affect   ASSESSMENT:    Acute diastolic heart failure (HCC) Mild CHF- no pedal edema but she does have tibial edema which she reports is new.  She did have impaired diastolic function on her echo in March 2020.  I suggested she take an extra Lasix 40 mg for 3 days.  She has a f/u scheduled soon with her PCP.  Check BMP then and if she continues to have symptoms check BNP. Reminded to avoid sodium  Hx of CABG CABG x 3 2010-LIMA-LAD, SVG-RCA, SVG-Dx Cath 2017 and Feb 2021- patent grafts to LAD and RCA, SVG-Dx chronically occluded.  Medical Rx Echo March 2020- normal LVF  Type 1 diabetes mellitus with proliferative retinopathy ([place]) Followed by Dr Steffanie Dunn at [place] [facility]) Followed by Dr Alvino Blood' disease Endocrinology following- if she is now hypothyroid that could contribute to her LE edema as well.   PLAN:    Increase Lasix to 40 mg BID x 3 days- then resume normal dose.    Medication Adjustments/Labs and Tests Ordered: Current medicines are reviewed at length with the patient today.  Concerns regarding medicines are outlined above.  Orders Placed This Encounter  Procedures  . EKG 12-Lead   No orders of the defined types were placed in this encounter.   Patient Instructions  Medication Instructions:  INCREASE- Furosemide(Lasix) 40 mg by mouth twice a day for 3 days, then back to regular dose.  *If you need a refill on your cardiac medications before your next appointment, please call your pharmacy*   Lab Work: None Ordered    Testing/Procedures: None Ordered   Follow-Up: At [facility], you and your health needs are our priority.  As part of our continuing mission to provide you with exceptional heart care, we have created designated Provider Care Teams.  These Care Teams  include your primary Cardiologist (physician) and Advanced Practice Providers (APPs -  Physician Assistants and Nurse Practitioners) who all work together to provide you with the care you need, when you need it.  We recommend signing up for the patient portal called "MyChart".  Sign up information is provided on this After Visit Summary.  MyChart is used to connect with patients for Virtual Visits (Telemedicine).  Patients are able to view lab/test results, encounter notes, upcoming appointments, etc.  Non-urgent messages can be sent to your provider as well.   To learn more about what you can do with MyChart, go to [email or web address].         Angelena Form, PA-C  12/09/2020 3:46 PM    [facility]

## 2020-12-09 NOTE — Assessment & Plan Note (Signed)
Followed by Dr Kathee Delton

## 2020-12-09 NOTE — Assessment & Plan Note (Signed)
CABG x 3 2010-LIMA-LAD, SVG-RCA, SVG-Dx Cath 2017 and Feb 2021- patent grafts to LAD and RCA, SVG-Dx chronically occluded.  Medical Rx Echo March 2020- normal LVF

## 2020-12-09 NOTE — Assessment & Plan Note (Addendum)
Mild CHF- no pedal edema but she does have tibial edema which she reports is new.  She did have impaired diastolic function on her echo in March 2020.  I suggested she take an extra Lasix 40 mg for 3 days.  She has a f/u scheduled soon with her PCP.  Check BMP then and if she continues to have symptoms check BNP. Reminded to avoid sodium

## 2020-12-09 NOTE — Patient Instructions (Signed)
Medication Instructions:  INCREASE- Furosemide(Lasix) 40 mg by mouth twice a day for 3 days, then back to regular dose.  *If you need a refill on your cardiac medications before your next appointment, please call your pharmacy*   Lab Work: None Ordered    Testing/Procedures: None Ordered   Follow-Up: At [facility], you and your health needs are our priority.  As part of our continuing mission to provide you with exceptional heart care, we have created designated Provider Care Teams.  These Care Teams include your primary Cardiologist (physician) and Advanced Practice Providers (APPs -  Physician Assistants and Nurse Practitioners) who all work together to provide you with the care you need, when you need it.  We recommend signing up for the patient portal called "MyChart".  Sign up information is provided on this After Visit Summary.  MyChart is used to connect with patients for Virtual Visits (Telemedicine).  Patients are able to view lab/test results, encounter notes, upcoming appointments, etc.  Non-urgent messages can be sent to your provider as well.   To learn more about what you can do with MyChart, go to [email or web address].

## 2020-12-09 NOTE — Assessment & Plan Note (Signed)
Followed by Dr Steffanie Dunn at [facility]

## 2020-12-12 ENCOUNTER — Other Ambulatory Visit: Payer: Self-pay | Admitting: Physician Assistant

## 2020-12-12 NOTE — Telephone Encounter (Signed)
Last Visit: 10/10/2020 Next Visit: 01/06/2021  Last Fill Cyclobenzaprine: 11/10/2020  Okay to refill Cyclobenzaprine and Folic Acid?

## 2020-12-15 ENCOUNTER — Ambulatory Visit (INDEPENDENT_AMBULATORY_CARE_PROVIDER_SITE_OTHER): Payer: 59 | Admitting: Internal Medicine

## 2020-12-15 ENCOUNTER — Other Ambulatory Visit: Payer: Self-pay

## 2020-12-15 ENCOUNTER — Encounter: Payer: Self-pay | Admitting: Internal Medicine

## 2020-12-15 VITALS — BP 132/80 | HR 87 | Temp 98.1°F | Ht 63.0 in | Wt 175.2 lb

## 2020-12-15 DIAGNOSIS — I5032 Chronic diastolic (congestive) heart failure: Secondary | ICD-10-CM | POA: Diagnosis not present

## 2020-12-15 DIAGNOSIS — M549 Dorsalgia, unspecified: Secondary | ICD-10-CM | POA: Diagnosis not present

## 2020-12-15 DIAGNOSIS — R109 Unspecified abdominal pain: Secondary | ICD-10-CM

## 2020-12-15 DIAGNOSIS — E0590 Thyrotoxicosis, unspecified without thyrotoxic crisis or storm: Secondary | ICD-10-CM

## 2020-12-15 DIAGNOSIS — Z23 Encounter for immunization: Secondary | ICD-10-CM | POA: Diagnosis not present

## 2020-12-15 DIAGNOSIS — G8929 Other chronic pain: Secondary | ICD-10-CM | POA: Diagnosis not present

## 2020-12-15 DIAGNOSIS — R600 Localized edema: Secondary | ICD-10-CM

## 2020-12-15 NOTE — Patient Instructions (Signed)
Allergra/xyzal/claritin or zyrtec    Eustachian Tube Dysfunction  Eustachian tube dysfunction refers to a condition in which a blockage develops in the narrow passage that connects the middle ear to the back of the nose (eustachian tube). The eustachian tube regulates air pressure in the middle ear by letting air move between the ear and nose. It also helps to drain fluid from the middle ear space. Eustachian tube dysfunction can affect one or both ears. When the eustachian tube does not function properly, air pressure, fluid, or both can build up in the middle ear. What are the causes? This condition occurs when the eustachian tube becomes blocked or cannot open normally. Common causes of this condition include:  Ear infections.  Colds and other infections that affect the nose, mouth, and throat (upper respiratory tract).  Allergies.  Irritation from cigarette smoke.  Irritation from stomach acid coming up into the esophagus (gastroesophageal reflux). The esophagus is the tube that carries food from the mouth to the stomach.  Sudden changes in air pressure, such as from descending in an airplane or scuba diving.  Abnormal growths in the nose or throat, such as: ? Growths that line the nose (nasal polyps). ? Abnormal growth of cells (tumors). ? Enlarged tissue at the back of the throat (adenoids). What increases the risk? You are more likely to develop this condition if:  You smoke.  You are overweight.  You are a child who has: ? Certain birth defects of the mouth, such as cleft palate. ? Large tonsils or adenoids. What are the signs or symptoms? Common symptoms of this condition include:  A feeling of fullness in the ear.  Ear pain.  Clicking or popping noises in the ear.  Ringing in the ear.  Hearing loss.  Loss of balance.  Dizziness. Symptoms may get worse when the air pressure around you changes, such as when you travel to an area of high elevation, fly on an  airplane, or go scuba diving. How is this diagnosed? This condition may be diagnosed based on:  Your symptoms.  A physical exam of your ears, nose, and throat.  Tests, such as those that measure: ? The movement of your eardrum (tympanogram). ? Your hearing (audiometry). How is this treated? Treatment depends on the cause and severity of your condition.  In mild cases, you may relieve your symptoms by moving air into your ears. This is called "popping the ears."  In more severe cases, or if you have symptoms of fluid in your ears, treatment may include: ? Medicines to relieve congestion (decongestants). ? Medicines that treat allergies (antihistamines). ? Nasal sprays or ear drops that contain medicines that reduce swelling (steroids). ? A procedure to drain the fluid in your eardrum (myringotomy). In this procedure, a small tube is placed in the eardrum to:  Drain the fluid.  Restore the air in the middle ear space. ? A procedure to insert a balloon device through the nose to inflate the opening of the eustachian tube (balloon dilation). Follow these instructions at home: Lifestyle  Do not do any of the following until your health care provider approves: ? Travel to high altitudes. ? Fly in airplanes. ? Work in a Pension scheme manager or room. ? Scuba dive.  Do not use any products that contain nicotine or tobacco, such as cigarettes and e-cigarettes. If you need help quitting, ask your health care provider.  Keep your ears dry. Wear fitted earplugs during showering and bathing. Dry your ears completely  after. General instructions  Take over-the-counter and prescription medicines only as told by your health care provider.  Use techniques to help pop your ears as recommended by your health care provider. These may include: ? Chewing gum. ? Yawning. ? Frequent, forceful swallowing. ? Closing your mouth, holding your nose closed, and gently blowing as if you are trying to blow  air out of your nose.  Keep all follow-up visits as told by your health care provider. This is important. Contact a health care provider if:  Your symptoms do not go away after treatment.  Your symptoms come back after treatment.  You are unable to pop your ears.  You have: ? A fever. ? Pain in your ear. ? Pain in your head or neck. ? Fluid draining from your ear.  Your hearing suddenly changes.  You become very dizzy.  You lose your balance. Summary  Eustachian tube dysfunction refers to a condition in which a blockage develops in the eustachian tube.  It can be caused by ear infections, allergies, inhaled irritants, or abnormal growths in the nose or throat.  Symptoms include ear pain, hearing loss, or ringing in the ears.  Mild cases are treated with maneuvers to unblock the ears, such as yawning or ear popping.  Severe cases are treated with medicines. Surgery may also be done (rare). This information is not intended to replace advice given to you by your health care provider. Make sure you discuss any questions you have with your health care provider. Document Revised: 04/08/2018 Document Reviewed: 04/08/2018 Elsevier Patient Education  [place].

## 2020-12-15 NOTE — Progress Notes (Signed)
Chief Complaint  Patient presents with  . Follow-up   F/u  1. Left arm shingrix today  2. Edema recently saw cards 12/09/20 who increased lasix 40 mg bid x 3 days want wanted repeat BMET, bnp checked h/o diastolic heart failure  She wants thyroid ho graves/hyperthyroidism (variable TSH) checked with h/o leg edema endocrine appt sch 01/2021  3. Chronic abdominal pain 5/10 today intermittent no mesenteric ischemic noted or stenosis thought 2/2 ischemic colitis and no intervention rec. F/u yearly with vascular  4. Chronic back pain disc consider PT [facility] in future pt will let me know  MRI C spine 02/2018 IMPRESSION: Negative cervical MRI.  MRI T 02/2018  IMPRESSION: 1. Small, noncompressive disc protrusions at T9-10 and T11-12. 2. Dorsal epidural fat expansion without cord impingement.  MRI L spine  IMPRESSION: No impingement to explain right leg symptoms. No focal finding to explain back pain.   Review of Systems  Constitutional: Negative for weight loss.  HENT: Negative for hearing loss.   Eyes: Negative for blurred vision.  Respiratory: Negative for shortness of breath.   Cardiovascular: Negative for chest pain.  Gastrointestinal: Positive for abdominal pain.  Musculoskeletal: Positive for back pain.  Skin: Negative for rash.  Neurological: Negative for headaches.  Psychiatric/Behavioral: Negative for depression.   Past Medical History:  Diagnosis Date  . ACUT MI ANTEROLAT WALL SUBSQT EPIS CARE   . Acute maxillary sinusitis   . ALLERGIC RHINITIS, SEASONAL   . ARTHRITIS, RHEUMATOID    shoulders and hands Enbrel>leg swelling Dr. Estanislado Pandy  . Atrial fibrillation ([facility])    a. after CABG.  . Back pain   . Bruxism (teeth grinding)   . CAD, ARTERY BYPASS GRAFT    a. DES to RCA in 2010 then LAD occlusion s/p CABG 3 06/07/2009 with LIMA to LAD, reverse SVG to D1, reverse SVG to distal RCA. b. Cath 05/08/2016 slightly hypodense region in the intermediate branch, however she had  excellent flow, FFR was normal. Vein graft to PDA and the posterolateral branch is patent, patent LIMA to LAD, occluded SVG to diagonal.  . CARPAL TUNNEL SYNDROME, BILATERAL   . CHOLELITHIASIS   . Contrast media allergy   . DERMATITIS, ALLERGIC   . DIABETES MELLITUS, TYPE I    on insulin pump dx'ed age 23 y.o   . DIABETIC  RETINOPATHY   . Hiatal hernia   . HYPERLIPIDEMIA-MIXED   . HYPERTHYROIDISM    Dr. Dollene Cleveland  . Insulin pump in place   . MIGRAINE W/O AURA W/INTRACT W/STATUS MIGRAINOSUS 02/19/2008  . Psoriasis   . SINUS TACHYCARDIA 11/08/2010  . SVT (supraventricular tachycardia) ([facility])    after s/p CABG  . TRIGGER FINGER    all fingers b/l hands   . URI    Past Surgical History:  Procedure Laterality Date  . ABDOMINAL HYSTERECTOMY     endometriomas b/l; total ? cervix removed; no h/o abnormal pap  . Caesarean section    . CARDIAC CATHETERIZATION N/A 05/08/2016   Procedure: Left Heart Cath and Cors/Grafts Angiography;  Surgeon: Burnell Blanks, MD;  Location: [place] [facility];  Service: Cardiovascular;  Laterality: N/A;  . CARPAL TUNNEL RELEASE     b/l   . CATARACT EXTRACTION, BILATERAL    . CHOLECYSTECTOMY    . COLONOSCOPY WITH PROPOFOL N/A 03/25/2020   Procedure: COLONOSCOPY WITH PROPOFOL;  Surgeon: Jonathon Bellows, MD;  Location: [facility] ENDOSCOPY;  Service: Gastroenterology;  Laterality: N/A;  . CORONARY ARTERY BYPASS GRAFT    .  ESOPHAGOGASTRODUODENOSCOPY (EGD) WITH PROPOFOL N/A 03/25/2020   Procedure: ESOPHAGOGASTRODUODENOSCOPY (EGD) WITH PROPOFOL;  Surgeon: Jonathon Bellows, MD;  Location: [facility] ENDOSCOPY;  Service: Gastroenterology;  Laterality: N/A;  . EYE SURGERY     laser x 2 for retinopathy   . LEFT HEART CATH AND CORS/GRAFTS ANGIOGRAPHY N/A 02/22/2020   Procedure: LEFT HEART CATH AND CORS/GRAFTS ANGIOGRAPHY;  Surgeon: Burnell Blanks, MD;  Location: [place] [facility];  Service: Cardiovascular;  Laterality: N/A;  . TRIGGER FINGER RELEASE     b/l fingers all    . VITRECTOMY     b/l    Family History  Problem Relation Age of Onset  . Depression Mother   . Anxiety disorder Mother   . Colon polyps Father   . Diabetes Father        2  . Hypertension Father   . Parkinson's disease Father   . Diabetes Other   . Heart disease Other   . Hypertension Other   . Hyperlipidemia Other   . Depression Other   . Migraines Other   . Stroke Paternal Grandmother   . Heart attack Neg Hx   . Colon cancer Neg Hx   . Stomach cancer Neg Hx   . Esophageal cancer Neg Hx    Social History   Socioeconomic History  . Marital status: Divorced    Spouse name: Not on file  . Number of children: Not on file  . Years of education: Not on file  . Highest education level: Not on file  Occupational History  . Not on file  Tobacco Use  . Smoking status: Never Smoker  . Smokeless tobacco: Never Used  Vaping Use  . Vaping Use: Never used  Substance and Sexual Activity  . Alcohol use: Yes    Comment: rarely 1 every 6 months  . Drug use: No  . Sexual activity: Not Currently    Partners: Male    Birth control/protection: None  Other Topics Concern  . Not on file  Social History Narrative   Divorced. Has 2 kids(fraternal twins) daughters age 44. Works at [employer], Never smoked, denies ETOH, no drugs. Drinks diet coke. No exercise.       DPR daughters [name] and Meha Vidrine twins          Social Determinants of Health   Financial Resource Strain: Not on file  Food Insecurity: Not on file  Transportation Needs: Not on file  Physical Activity: Not on file  Stress: Not on file  Social Connections: Not on file  Intimate Partner Violence: Not on file   Current Meds  Medication Sig  . amLODipine (NORVASC) 5 MG tablet Take 0.5 tablets (2.5 mg total) by mouth daily.  Marland Kitchen aspirin EC 81 MG tablet Take 81 mg by mouth daily.  . CHELATED MAGNESIUM PO Take 250 mg by mouth daily.  . clopidogrel (PLAVIX) 75 MG tablet TAKE 1 TABLET BY MOUTH ONCE  DAILY  . cyclobenzaprine (FLEXERIL) 10 MG tablet TAKE 1 TABLET BY MOUTH AT BEDTIME  . EPINEPHrine 0.3 mg/0.3 mL IJ SOAJ injection Inject 0.3 mLs (0.3 mg total) into the muscle as needed for anaphylaxis.  . folic acid (FOLVITE) 1 MG tablet TAKE 2 TABLETS BY MOUTH DAILY.  . furosemide (LASIX) 40 MG tablet Take 1 tablet (40 mg total) by mouth daily as needed for edema. (Patient taking differently: Take 40 mg by mouth daily.)  . insulin lispro (HUMALOG) 100 UNIT/ML injection Inject 80-90 Units into the skin continuous.  FOR USE IN INSULIN PUMP. TOTAL DAILY INSULIN DOSE = UP TO 90 UNITS.  Marland Kitchen isosorbide mononitrate (IMDUR) 60 MG 24 hr tablet TAKE 1 TABLET (60 MG TOTAL) BY MOUTH DAILY.  Marland Kitchen Krill Oil 500 MG CAPS Take 500 mg by mouth daily.  Marland Kitchen leflunomide (ARAVA) 20 MG tablet TAKE 1 TABLET BY MOUTH DAILY  . linaclotide (LINZESS) 290 MCG CAPS capsule Take 1 capsule (290 mcg total) by mouth daily before breakfast.  . losartan (COZAAR) 100 MG tablet Take 1 tablet (100 mg total) by mouth daily.  . metoprolol tartrate (LOPRESSOR) 50 MG tablet TAKE 1 TABLET BY MOUTH TWICE DAILY  . omeprazole (PRILOSEC) 40 MG capsule   . ondansetron (ZOFRAN-ODT) 4 MG disintegrating tablet Take 4 mg by mouth every 8 (eight) hours as needed.  . ONE TOUCH ULTRA TEST test strip   . pantoprazole (PROTONIX) 40 MG tablet Take 1 tablet (40 mg total) by mouth daily.  . potassium chloride (KLOR-CON) 10 MEQ tablet Take 1 tablet (10 mEq total) by mouth daily.  . promethazine (PHENERGAN) 25 MG tablet Take 0.5-1 tablets (12.5-25 mg total) by mouth 2 (two) times daily as needed for nausea or vomiting.  . rosuvastatin (CRESTOR) 20 MG tablet Take 1 tablet (20 mg total) by mouth daily.  Marland Kitchen Upadacitinib ER (RINVOQ) 15 MG TB24 Take 15 mg by mouth daily.   Allergies  Allergen Reactions  . Actemra [Tocilizumab]   . Prochlorperazine Rash    Neuro problems per pt  . Ramipril Swelling, Rash and Other (See Comments)  . Shellfish-Derived Products  Swelling    Shrimp   . Atorvastatin Rash    Elevated LFT's  . Compazine  [Prochlorperazine Edisylate] Other (See Comments)    Neurological reaction  . Emgality [Galcanezumab-Gnlm] Hives and Swelling  . Etanercept Swelling and Rash  . Infliximab Rash  . Iohexol     Iv contrast dye -rash all over  . Orencia [Abatacept] Rash  . Prochlorperazine Edisylate     unknown  . Shellfish Allergy Swelling    Shrimp(Facial swelling)  . Tofacitinib Rash and Other (See Comments)    Severe abdominal pain   . [employer]]     [employer], memory issues, word finding issues  . Amiodarone Nausea Only  . Rituximab Rash    Causes a rash   Recent Results (from the past 2160 hour(s))  QuantiFERON-TB Gold Plus     Status: None   Collection Time: 10/10/20  2:38 PM  Result Value Ref Range   QuantiFERON-TB Gold Plus NEGATIVE NEGATIVE    Comment: Negative test result. M. tuberculosis complex  infection unlikely.    NIL 0.04 IU/mL   Mitogen-NIL >10.00 IU/mL   TB1-NIL 0.00 IU/mL   TB2-NIL 0.00 IU/mL    Comment: . The Nil tube value reflects the background interferon gamma immune response of the patient's blood sample. This value has been subtracted from the patient's displayed TB and Mitogen results. . Lower than expected results with the Mitogen tube prevent false-negative Quantiferon readings by detecting a patient with a potential immune suppressive condition and/or suboptimal pre-analytical specimen handling. . The TB1 Antigen tube is coated with the M. tuberculosis-specific antigens designed to elicit responses from TB antigen primed CD4+ helper T-lymphocytes. . The TB2 Antigen tube is coated with the M. tuberculosis-specific antigens designed to elicit responses from TB antigen primed CD4+ helper and CD8+ cytotoxic T-lymphocytes. . For additional information, please refer to https://education.[facility].com/faq/FAQ204 (This link is being provided for  informational/ educational  purposes only.) .   Pro b natriuretic peptide (BNP)     Status: None   Collection Time: 12/15/20  4:21 PM  Result Value Ref Range   NT-Pro BNP 60 0 - 249 pg/mL    Comment: The following cut-points have been suggested for the use of proBNP for the diagnostic evaluation of heart failure (HF) in patients with acute dyspnea: Modality                     Age           Optimal Cut                            (years)            Point ------------------------------------------------------ Diagnosis (rule in HF)        <50            450 pg/mL                           50 - 75            900 pg/mL                               >75           1800 pg/mL Exclusion (rule out HF)  Age independent     300 pg/mL   Basic metabolic panel     Status: None   Collection Time: 12/15/20  4:21 PM  Result Value Ref Range   Sodium 137 135 - 145 mEq/L   Potassium 3.7 3.5 - 5.1 mEq/L   Chloride 100 96 - 112 mEq/L   CO2 28 19 - 32 mEq/L   Glucose, Bld 83 70 - 99 mg/dL   BUN 15 6 - 23 mg/dL   Creatinine, Ser 0.89 0.40 - 1.20 mg/dL   GFR 74.17 >60.00 mL/min    Comment: Calculated using the CKD-EPI Creatinine Equation (2021)   Calcium 9.2 8.4 - 10.5 mg/dL  TSH     Status: None   Collection Time: 12/15/20  4:21 PM  Result Value Ref Range   TSH 0.88 0.35 - 4.50 uIU/mL   Objective  Body mass index is 31.04 kg/m. Wt Readings from Last 3 Encounters:  12/15/20 175 lb 3.2 oz (79.5 kg)  12/09/20 176 lb 3.2 oz (79.9 kg)  12/01/20 176 lb 9.6 oz (80.1 kg)   Temp Readings from Last 3 Encounters:  12/15/20 98.1 F (36.7 C) (Oral)  09/14/20 98.1 F (36.7 C) (Oral)  08/28/20 98.7 F (37.1 C) (Oral)   BP Readings from Last 3 Encounters:  12/15/20 132/80  12/09/20 112/62  12/01/20 126/82   Pulse Readings from Last 3 Encounters:  12/15/20 87  12/09/20 80  12/01/20 65    Physical Exam Vitals and nursing note reviewed.  Constitutional:      Appearance: Normal appearance. She  is well-developed and well-groomed. She is obese.  HENT:     Head: Normocephalic and atraumatic.  Eyes:     Conjunctiva/sclera: Conjunctivae normal.     Pupils: Pupils are equal, round, and reactive to light.  Cardiovascular:     Rate and Rhythm: Normal rate and regular rhythm.     Heart sounds: Normal heart sounds. No murmur heard.   Pulmonary:     Effort: Pulmonary effort  is normal.     Breath sounds: Normal breath sounds.  Skin:    General: Skin is warm and dry.  Neurological:     General: No focal deficit present.     Mental Status: She is alert and oriented to person, place, and time. Mental status is at baseline.     Gait: Gait normal.  Psychiatric:        Attention and Perception: Attention and perception normal.        Mood and Affect: Mood and affect normal.        Speech: Speech normal.        Behavior: Behavior normal. Behavior is cooperative.        Thought Content: Thought content normal.        Cognition and Memory: Cognition and memory normal.        Judgment: Judgment normal.     Assessment  Plan  Hypothyroidism, unspecified type - Plan: TSH F/u endocrine 01/2021   Leg edema - Plan: Pro b natriuretic peptide (BNP), Basic metabolic panel CC labs to cards  Chronic diastolic CHF (congestive heart failure) improved and stable leg edema improved  F/u cards   Chronic back pain mid back with h/o abnormal Mri T spine 02/2018 Consider [facility] at [facility] disc with pt  Chronic abdominal pain  F/u Gi and vascular prn consider if referred from mid back pain   HM  Flu shotutd 08/2020 prevnar utd  pna 23utd Tdap utd 06/25/18 covid vx had 2/2disc booster in 6 months due to immunocompromised sch 01/14/21 shingrix 2/2 given today   mammo 11/11/19 negative ordered sch 01/11/21 DEXA 11/11/19 normal  Colonoscopy 08/30/17 normal Dr. Memory Dance colitis, 2021 establishedDr. Vicente Males colonoscopy EGD sch 03/25/20 Pap no longer needed  S/p hysterectomy no h/o abnormal  Endometriosis  total hysterectomy   SPEP abnormal consider nephrotic proteinuria consider renal in future 10/15/2019 no abnormal bands but low urine creatinine and protein  [facility] Dr. Steffanie Dunn appt 2/19/21due to f/u 07/2020, 01/2021  Cards Dr. Rush Landmark 03/10/20 GNA-migraines  Dermatology Dr. Laurence Ferrari saw 12/2019 Rheumatology Dr. Estanislado Pandy  Hand surgery Dr. Fredna Dow Vascular Dr. Lucky Cowboy  Retinal specialist Dr. Tempie Hoist  GI: Dr. Jonathon Bellows   Provider: Dr. Olivia Mackie McLean-Scocuzza-Internal [facility]

## 2020-12-16 LAB — BASIC METABOLIC PANEL
BUN: 15 mg/dL (ref 6–23)
CO2: 28 mEq/L (ref 19–32)
Calcium: 9.2 mg/dL (ref 8.4–10.5)
Chloride: 100 mEq/L (ref 96–112)
Creatinine, Ser: 0.89 mg/dL (ref 0.40–1.20)
GFR: 74.17 mL/min (ref >60.00)
Glucose, Bld: 83 mg/dL (ref 70–99)
Potassium: 3.7 mEq/L (ref 3.5–5.1)
Sodium: 137 mEq/L (ref 135–145)

## 2020-12-16 LAB — TSH: TSH: 0.88 u[IU]/mL (ref 0.35–4.50)

## 2020-12-16 LAB — PRO B NATRIURETIC PEPTIDE: NT-Pro BNP: 60 pg/mL (ref 0–249)

## 2020-12-19 DIAGNOSIS — G8929 Other chronic pain: Secondary | ICD-10-CM | POA: Insufficient documentation

## 2020-12-19 DIAGNOSIS — M549 Dorsalgia, unspecified: Secondary | ICD-10-CM | POA: Insufficient documentation

## 2020-12-19 DIAGNOSIS — I5032 Chronic diastolic (congestive) heart failure: Secondary | ICD-10-CM | POA: Insufficient documentation

## 2020-12-26 MED FILL — RINVOQ 15 MG TB24: 15 | 30 days supply | Qty: 30 | Fill #2

## 2020-12-27 NOTE — Progress Notes (Signed)
Office Visit Note  Patient: [name]             Date of Birth: 21-Jun-1966           MRN: [id number]             PCP: McLean-Scocuzza, Nino Glow, MD Referring: Orland Mustard * Visit Date: 01/06/2021 Occupation: @GUAROCC @  Subjective:  Numbness in left hand   History of Present Illness: [name] is a 54 y.o. female with history of seronegative rheumatoid arthritis and DDD. She is taking rinvoq 15 mg 1 tablet by mouth daily and arava 20 mg 1 tablet by mouth daily. She is tolerating both medications without any side effects.  She denies missing any doses.  She has been experiencing increased pain and stiffness in both hands and both wrists, which she attributes to cooler weather temperatures.  She denies any joint swelling at this time. She has been experiencing intermittent paresthesias in the left hand (daytime and nocturnal symptoms).  She underwent carpal tunnel release surgery on both wrists in the past.  She plans on following up with Dr. Fredna Dow for further evaluation. She has also been experiencing pain and stiffness in the posterior aspect of the left knee.  she has noticed some buckling and instability in the left knee at times. She continues to have chronic lower back pain and stiffness.  She takes flexeril 10 mg at bedtime as needed for muscle spasms.  She denies any psoriasis.  She denies any recent infections.     Activities of Daily Living:  Patient reports morning stiffness for 1 hour.   Patient Reports nocturnal pain.  Difficulty dressing/grooming: Denies Difficulty climbing stairs: Reports Difficulty getting out of chair: Denies Difficulty using hands for taps, buttons, cutlery, and/or writing: Reports  Review of Systems  Constitutional: Positive for fatigue.  HENT: Negative for mouth sores, mouth dryness and nose dryness.   Eyes: Negative for pain, visual disturbance and dryness.  Respiratory: Negative for cough, hemoptysis, shortness of breath and  difficulty breathing.   Cardiovascular: Positive for swelling in legs/feet. Negative for chest pain, palpitations and hypertension.  Gastrointestinal: Positive for constipation. Negative for blood in stool and diarrhea.  Endocrine: Negative for increased urination.  Genitourinary: Negative for painful urination.  Musculoskeletal: Positive for arthralgias, joint pain, joint swelling and morning stiffness. Negative for myalgias, muscle weakness, muscle tenderness and myalgias.  Skin: Positive for nodules/bumps. Negative for color change, pallor, rash, hair loss, skin tightness, ulcers and sensitivity to sunlight.  Allergic/Immunologic: Negative for susceptible to infections.  Neurological: Positive for dizziness, headaches and weakness. Negative for numbness.  Hematological: Negative for swollen glands.  Psychiatric/Behavioral: Positive for sleep disturbance. Negative for depressed mood. The patient is not nervous/anxious.     PMFS History:  Patient Active Problem List   Diagnosis Date Noted  . Chronic diastolic CHF (congestive heart failure) ([place]) 12/19/2020  . Chronic back pain 12/19/2020  . Chronic abdominal pain 12/19/2020  . Acute diastolic heart failure ([place]) 12/09/2020  . Diabetic retinopathy associated with type 1 diabetes mellitus ([place]) 09/27/2020  . Diabetic retinopathy ([place]) 09/27/2020  . Graves' disease 09/27/2020  . Chronic coronary artery disease 09/27/2020  . Hypertension associated with diabetes ([employer]) 09/14/2020  . Obesity (BMI 30-39.9) 09/14/2020  . Annual physical exam 09/14/2020  . Aortic atherosclerosis ([place]) 09/14/2020  . Snoring 05/12/2020  . Lung nodule 03/10/2020  . Colitis 03/10/2020  . Type 1 diabetes mellitus with proliferative retinopathy ([place]) 12/10/2019  . Abnormal thyroid function  test 12/10/2019  . Bruxism (teeth grinding)   . Chronic migraine 10/07/2019  . Contusion of left hip 07/25/2019  . H/O multiple allergies 03/10/2019  . Hx of anaphylaxis  03/10/2019  . Cough 06/26/2018  . PND (post-nasal drip) 06/26/2018  . Lumbar strain, initial encounter 04/10/2018  . Thoracic myofascial strain 04/10/2018  . Vitamin D deficiency 07/04/2017  . B12 deficiency 07/04/2017  . Abnormal CT scan 06/19/2017  . Gastroesophageal reflux disease 06/19/2017  . Antiplatelet or antithrombotic long-term use 06/19/2017  . Nasal congestion 03/15/2017  . Graves disease 02/01/2017  . Sleep difficulties 02/01/2017  . No energy 02/01/2017  . Osteopenia 02/01/2017  . Bilateral hand pain 07/16/2016  . Acquired trigger finger 07/16/2016  . Hypertension, essential 05/08/2016  . Foot pain, right 08/30/2014  . Shoulder pain, right 08/30/2014  . Chronic pain syndrome 01/15/2014  . Multiple joint pain 01/15/2014  . Lesion of lower eyelid 04/21/2013  . Generalized constriction of visual field 03/31/2013  . Neoplasm of uncertain behavior of skin of eyelid 03/31/2013  . Posterior capsular opacification 03/31/2013  . Cyst of left lower eyelid 03/30/2013  . Pseudophakia of both eyes 03/30/2013  . Prurigo nodularis 02/05/2013  . Stasis dermatitis 02/05/2013  . Psoriasis 01/21/2013  . Trochanteric bursitis 11/10/2012  . Achilles tendinitis 07/11/2012  . Epiretinal membrane 06/17/2012  . Status post cataract extraction 04/30/2012  . Pes equinus, acquired 04/23/2012  . Tendinitis of ankle 04/23/2012  . Proliferative diabetic retinopathy of both eyes ([place]) 01/11/2012  . Ongoing use of possibly toxic medication 12/05/2011  . Hyperthyroidism 11/09/2010  . SINUS TACHYCARDIA 11/08/2010  . DERMATITIS, ALLERGIC 07/20/2010  . EDEMA 07/12/2010  . DIZZINESS 11/14/2009  . ATRIAL FIBRILLATION 07/20/2009  . Hx of CABG 06/07/2009  . ANGINA, STABLE/EXERTIONAL 06/02/2009  . Dyslipidemia, goal LDL below 70 04/26/2009  . ACUT MI ANTEROLAT WALL SUBSQT EPIS CARE 04/26/2009  . Chest pain 04/26/2009  . DIABETIC  RETINOPATHY 04/25/2009  . CARPAL TUNNEL SYNDROME, BILATERAL  04/25/2009  . TRIGGER FINGER 04/25/2009  . MIGRAINE W/O AURA W/INTRACT W/STATUS MIGRAINOSUS 02/19/2008  . ALLERGIC RHINITIS, SEASONAL 10/16/2006  . CHOLELITHIASIS 10/16/2006  . Rheumatoid arthritis ([place]) 10/16/2006    Past Medical History:  Diagnosis Date  . ACUT MI ANTEROLAT WALL SUBSQT EPIS CARE   . Acute maxillary sinusitis   . ALLERGIC RHINITIS, SEASONAL   . ARTHRITIS, RHEUMATOID    shoulders and hands Enbrel>leg swelling Dr. Estanislado Pandy  . Atrial fibrillation ([place])    a. after CABG.  . Back pain   . Bruxism (teeth grinding)   . CAD, ARTERY BYPASS GRAFT    a. DES to RCA in 2010 then LAD occlusion s/p CABG 3 06/07/2009 with LIMA to LAD, reverse SVG to D1, reverse SVG to distal RCA. b. Cath 05/08/2016 slightly hypodense region in the intermediate branch, however she had excellent flow, FFR was normal. Vein graft to PDA and the posterolateral branch is patent, patent LIMA to LAD, occluded SVG to diagonal.  . CARPAL TUNNEL SYNDROME, BILATERAL   . CHOLELITHIASIS   . Contrast media allergy   . DERMATITIS, ALLERGIC   . DIABETES MELLITUS, TYPE I    on insulin pump dx'ed age 54 y.o   . DIABETIC  RETINOPATHY   . Hiatal hernia   . HYPERLIPIDEMIA-MIXED   . HYPERTHYROIDISM    Dr. Dollene Cleveland  . Insulin pump in place   . MIGRAINE W/O AURA W/INTRACT W/STATUS MIGRAINOSUS 02/19/2008  . Psoriasis   . SINUS TACHYCARDIA 11/08/2010  . SVT (  supraventricular tachycardia) ([place])    after s/p CABG  . TRIGGER FINGER    all fingers b/l hands   . URI     Family History  Problem Relation Age of Onset  . Depression Mother   . Anxiety disorder Mother   . Colon polyps Father   . Diabetes Father        2  . Hypertension Father   . Parkinson's disease Father   . Diabetes Other   . Heart disease Other   . Hypertension Other   . Hyperlipidemia Other   . Depression Other   . Migraines Other   . Stroke Paternal Grandmother   . Heart attack Neg Hx   . Colon cancer Neg Hx   . Stomach cancer Neg Hx    . Esophageal cancer Neg Hx    Past Surgical History:  Procedure Laterality Date  . ABDOMINAL HYSTERECTOMY     endometriomas b/l; total ? cervix removed; no h/o abnormal pap  . Caesarean section    . CARDIAC CATHETERIZATION N/A 05/08/2016   Procedure: Left Heart Cath and Cors/Grafts Angiography;  Surgeon: Burnell Blanks, MD;  Location: [facility];  Service: Cardiovascular;  Laterality: N/A;  . CARPAL TUNNEL RELEASE     b/l   . CATARACT EXTRACTION, BILATERAL    . CHOLECYSTECTOMY    . COLONOSCOPY WITH PROPOFOL N/A 03/25/2020   Procedure: COLONOSCOPY WITH PROPOFOL;  Surgeon: Jonathon Bellows, MD;  Location: [facility] ENDOSCOPY;  Service: Gastroenterology;  Laterality: N/A;  . CORONARY ARTERY BYPASS GRAFT    . ESOPHAGOGASTRODUODENOSCOPY (EGD) WITH PROPOFOL N/A 03/25/2020   Procedure: ESOPHAGOGASTRODUODENOSCOPY (EGD) WITH PROPOFOL;  Surgeon: Jonathon Bellows, MD;  Location: [facility] ENDOSCOPY;  Service: Gastroenterology;  Laterality: N/A;  . EYE SURGERY     laser x 2 for retinopathy   . LEFT HEART CATH AND CORS/GRAFTS ANGIOGRAPHY N/A 02/22/2020   Procedure: LEFT HEART CATH AND CORS/GRAFTS ANGIOGRAPHY;  Surgeon: Burnell Blanks, MD;  Location: [facility];  Service: Cardiovascular;  Laterality: N/A;  . TRIGGER FINGER RELEASE     b/l fingers all   . VITRECTOMY     b/l    Social History   Social History Narrative   Divorced. Has 2 kids(fraternal twins) daughters age 38. Works at [employer], Never smoked, denies ETOH, no drugs. Drinks diet coke. No exercise.       DPR daughters [name] and Aleha Larimer twins          Immunization History  Administered Date(s) Administered  . Influenza, Quadrivalent, Recombinant, Inj, Pf 10/14/2013  . Influenza, Seasonal, Injecte, Preservative Fre 09/19/2016  . Influenza,inj,Quad PF,6+ Mos 09/30/2014, 09/03/2018  . Influenza,inj,quad, With Preservative 09/23/2017  . Influenza-Unspecified 09/30/2014, 09/30/2016, 09/03/2018,  09/10/2019, 09/28/2020  . PFIZER SARS-COV-2 Vaccination 12/28/2019, 01/18/2020  . Pneumococcal Conjugate-13 07/16/2016  . Pneumococcal Polysaccharide-23 01/01/2004, 10/15/2006, 01/04/2020  . Td 10/01/2003  . Tdap 01/09/2011, 06/25/2018  . Zoster Recombinat (Shingrix) 09/14/2020, 12/15/2020     Objective: Vital Signs: BP 128/74 (BP Location: Left Arm, Patient Position: Sitting, Cuff Size: Normal)   Pulse (!) 57   Ht 5\' 3"  (1.6 m)   Wt 175 lb 9.6 oz (79.7 kg)   BMI 31.11 kg/m    Physical Exam Vitals and nursing note reviewed.  Constitutional:      Appearance: She is well-developed and well-nourished.  HENT:     Head: Normocephalic and atraumatic.  Eyes:     Extraocular Movements: EOM normal.     Conjunctiva/sclera: Conjunctivae  normal.  Cardiovascular:     Pulses: Intact distal pulses.  Pulmonary:     Effort: Pulmonary effort is normal.  Abdominal:     Palpations: Abdomen is soft.  Musculoskeletal:     Cervical back: Normal range of motion.  Skin:    General: Skin is warm and dry.     Capillary Refill: Capillary refill takes less than 2 seconds.  Neurological:     Mental Status: She is alert and oriented to person, place, and time.  Psychiatric:        Mood and Affect: Mood and affect normal.        Behavior: Behavior normal.      Musculoskeletal Exam: C-spine limited ROM with lateral rotation.  Shoulder joints painful and slightly limited abduction and internal rotation bilaterally.  Bilateral elbow joint contractures.  Limited ROM of both wrist joints.  Tenderness on the dorsal aspect of the left wrist.  Ganglion cyst prior on volar aspect of the left wrist joint.  MCPs, PIPs, and DIPs good ROM with no synovitis.  PIP and DIP thickening consistent with osteoarthritis of both hands.  Hip joints good ROM with some discomfort in the right hip.  Tenderness over the right trochanteric bursa.  Knee joints good ROM with no discomfort.  Baker's cyst palpable behind the left knee.   No warmth or effusion of knee joints noted.  Ankle joints good ROM with no tenderness or inflammation. No tenderness of MTP joints.  CDAI Exam: CDAI Score: -- Patient Global: --; Provider Global: -- Swollen: --; Tender: -- Joint Exam 01/06/2021   No joint exam has been documented for this visit   There is currently no information documented on the homunculus. Go to the Rheumatology activity and complete the homunculus joint exam.  Investigation: No additional findings.  Imaging: MR ANGIO ABDOMEN W WO CONTRAST  Result Date: 12/09/2020 CLINICAL DATA:  Epigastric, left upper abdominal pain, right lower quadrant pain. History of colitis. Previous cholecystectomy. EXAM: MRA ABDOMEN AND PELVIS WITH CONTRAST TECHNIQUE: Multiplanar, multiecho pulse sequences of the abdomen and pelvis were obtained with intravenous contrast. Angiographic images of abdomen and pelvis were obtained using MRA technique with intravenous contrast. CONTRAST:  7.71mL GADAVIST GADOBUTROL 1 MMOL/ML IV SOLN COMPARISON:  CT 10/04/2020 and previous FINDINGS: ARTERIAL Aorta: Mild atheromatous irregularity. No aneurysm, dissection, or stenosis. Celiac axis:          Widely patent Superior mesenteric:  Widely patent, classic distal branch anatomy Left renal:           Single, patent Right renal:          Single, patent Inferior mesenteric:  Diminutive, patent Left iliac:           Patent Right iliac: Atheromatous plaque resulting in mild stenosis at the origin of the common iliac, otherwise widely patent. VENOUS Patent hepatic veins, portal vein, SMV, splenic vein, bilateral renal veins. IVC and iliac venous system unremarkable. No venous pathology identified. NONVASCULAR Lower chest: No pleural or pericardial effusion. No acute findings. Hepatobiliary: No mass or biliary ductal dilatation. Previous cholecystectomy. Pancreas: No mass, inflammatory changes, or other significant abnormality. Spleen: Within normal limits in size and  appearance. Adrenals/Urinary Tract: No masses identified. No evidence of hydronephrosis. Stomach/Bowel: No evidence of obstruction, inflammatory process, or abnormal fluid collections. Lymphatic: No pathologically enlarged lymph nodes. Reproductive: Previous hysterectomy.  No adnexal mass. Other: No ascites. Musculoskeletal: No suspicious bone lesions identified. IMPRESSION: 1. No significant abdominal arterial occlusive disease. 2. Mild stenosis at the  origin of the right common iliac artery. 3.  Aortic Atherosclerosis (ICD10-170.0). Electronically Signed   By: Lucrezia Europe M.D.   On: 12/09/2020 12:55    Recent Labs: Lab Results  Component Value Date   WBC 9.7 08/28/2020   HGB 12.0 08/28/2020   PLT 160 08/28/2020   NA 137 12/15/2020   K 3.7 12/15/2020   CL 100 12/15/2020   CO2 28 12/15/2020   GLUCOSE 83 12/15/2020   BUN 15 12/15/2020   CREATININE 0.89 12/15/2020   BILITOT 0.7 08/28/2020   ALKPHOS 65 08/28/2020   AST 31 08/28/2020   ALT 24 08/28/2020   PROT 6.6 08/28/2020   ALBUMIN 4.2 08/28/2020   CALCIUM 9.2 12/15/2020   GFRAA >60 08/28/2020   QFTBGOLDPLUS NEGATIVE 10/10/2020    Speciality Comments: PLQ Eye Exam: 01/06/2020 ABNORMAL @ [facility]. Patient advised to discontinue PLQ at office visit on 01/06/2020.  Prior Therapy: methotrexate/Xeljanz/Actemra (GI upset), Enbrel (injection site reaction) and Orencia/Remicade/Humira/Cimzia/Rituxan/Simponi/Simponi Aria (inadequate response).  Procedures:  No procedures performed Allergies: Actemra [tocilizumab], Prochlorperazine, Ramipril, Shellfish-derived products, Atorvastatin, Compazine  [prochlorperazine edisylate], Emgality [galcanezumab-gnlm], Etanercept, Infliximab, Iohexol, Orencia [abatacept], Prochlorperazine edisylate, Shellfish allergy, Tofacitinib, Trokendi xr [topiramate er], Amiodarone, and Rituximab   Assessment / Plan:     Visit Diagnoses: Rheumatoid arthritis of multiple sites with negative  rheumatoid factor (HCC) - Methotrexate/Xeljanz/Actemra (GI upset), Enbrel (injection site reaction) and Orencia/Remicade/Humira/Cimzia/Rituxan/Simponi/Simponi Aria (inadequate response): He has no synovitis on examination today.  She has not had any recent rheumatoid arthritis flares.  She is clinically doing well on Rinvoq 15 mg 1 tablet by mouth daily and Arava 20 mg 1 tablet by mouth daily.  She has not missed any doses of Arava or Enbrel recently.  She has been experiencing some increased pain and stiffness in both hands and both wrist joints. Discussed the use of arthritis compression gloves, voltaren gel, and natural antiinflammatories.  She has also been noticing paresthesias in her left hand.  She underwent carpal tunnel release surgery for both wrists in the past.  She plans on following up with Dr. Fredna Dow for further evaluation and management.  She has also been having some increased pain and stiffness in her left knee joint.  Baker's cyst was palpable in the posterior aspect of the left knee.  No warmth or effusion in the left knee joint was noted.  We discussed the use of a compression brace, ice, elevation, and the use of Voltaren gel.  She will continue taking Rinvoq and Arava as prescribed.  She does not need any refills at this time.  She was advised to notify us if she develops increased joint pain or joint swelling.  She will follow-up in the office in 5 months.   High risk medication use - Rinvoq 15 mg 1 tablet by mouth once daily and Arava 20 mg 1 tablet by mouth daily. TB gold negative on 10/10/20. BMP updated on 12/15/20.  She is overdue to update CBC and LFTs. Orders for CBC and CMP were released today.  Her next lab work will be due to April and every 3 months to monitor for drug toxicity.  Standing orders for CBC and CMP are in place. - Plan: CBC with Differential/Platelet, COMPLETE METABOLIC PANEL WITH GFR She has not had any recent infections.   Psoriasis: No active psoriasis at this  time.   Contracture of joint of both elbows - Mild. She has no tenderness or inflammation on exam.  Osteopenia of multiple sites -  Most recent DEXA on 11/11/19: The BMD measured at Femur Neck Left is 0.901 g/cm2 with a T-score of -1.0-Normal. Repeat DEXA in 5 years.  She continues to take a vitamin D supplement as recommended.   DDD (degenerative disc disease), thoracic: Chronic pain   DDD (degenerative disc disease), lumbar - X-rays of the lumbar spine were updated on 02/26/2020, which revealed diffuse degenerative changes.  She has chronic lower back pain and stiffness. She takes flexeril 10 mg by mouth at bedtime as needed for muscle spasms.   History of vitamin D deficiency: She continues to take a vitamin D supplement as recommended.   Abnormal SPEP - 10/15/19  Other fatigue: Chronic, stable.   Other insomnia: She has ongoing insomnia secondary to nocturnal pain. She takes flexeril 10 mg at bedtime as needed for insomnia and muscle spasms.   Left hand paresthesia: She has been experiencing intermittent paresthesias in the left hand. She had carpal tunnel release surgery in the past.  She has a ganglion cyst on the volar aspect of the left wrist.  She plans on following up with Dr. Merlyn Lot for further evaluation.  Other medical conditions are listed as follows:   History of hyperlipidemia - Lipid panel WNL 05/23/20.  History of hypertension  History of gastroesophageal reflux (GERD)  History of Graves' disease  History of coronary artery disease  History of migraine  History of diabetes mellitus  Orders: Orders Placed This Encounter  Procedures  . CBC with Differential/Platelet  . COMPLETE METABOLIC PANEL WITH GFR   No orders of the defined types were placed in this encounter.     Follow-Up Instructions: Return in about 5 months (around 06/06/2021) for Rheumatoid arthritis, DDD.   Gearldine Bienenstock, PA-C  Note - This record has been created using Dragon software.  Chart  creation errors have been sought, but may not always  have been located. Such creation errors do not reflect on  the standard of medical care.

## 2020-12-30 IMAGING — CT CT ABD-PELV W/O CM
1 of 2 series · 14 of 32 positions shown, 18 images · non-contrast
Comparison: CT of the abdomen pelvis dated 05/22/2017.

CLINICAL DATA: 53-year-old female with right-sided abdominal pain.

EXAM:
CT ABDOMEN AND PELVIS WITHOUT CONTRAST
TECHNIQUE: Multidetector CT imaging of the abdomen and pelvis was performed
following the standard protocol without IV contrast.

[Series 2: axial st · axial · 0.69mm/px · z∈[-892,-482]mm · 14 of 90 slices shown, 18 images]
[im 4/90  soft-tissue]
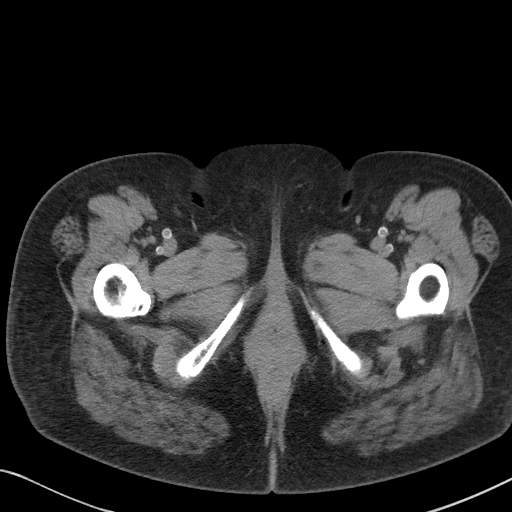
[im 4/90  bone]
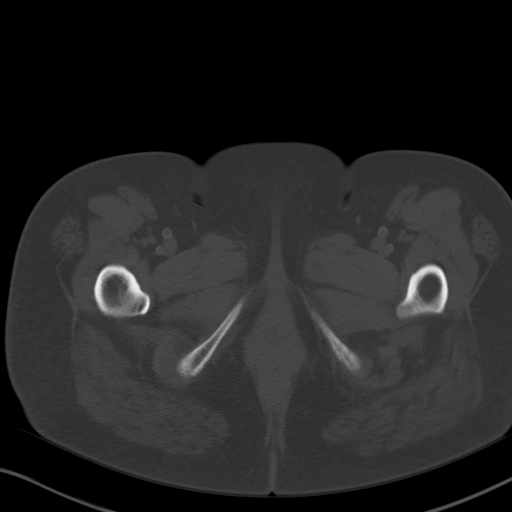
[im 12/90  soft-tissue]
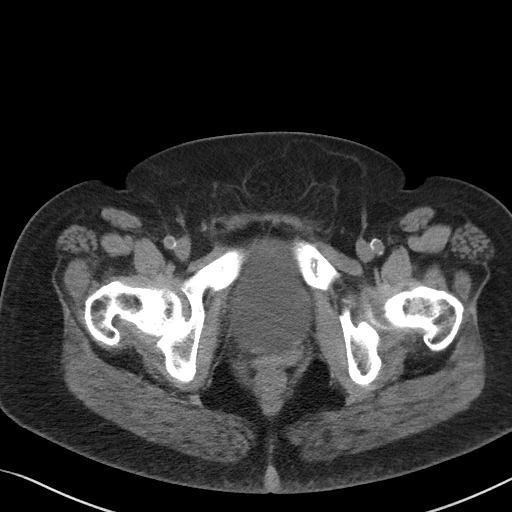
[im 19/90  soft-tissue]
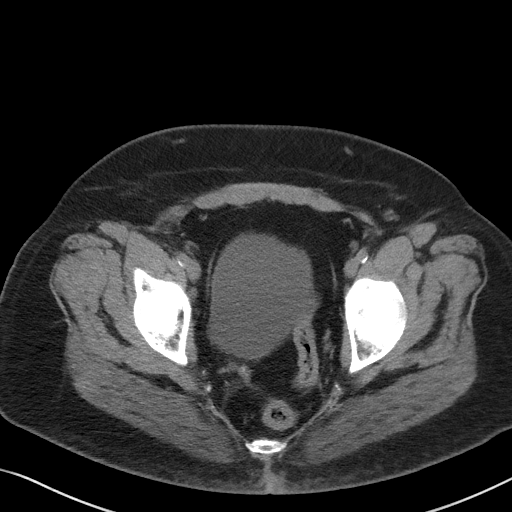
[im 26/90  soft-tissue]
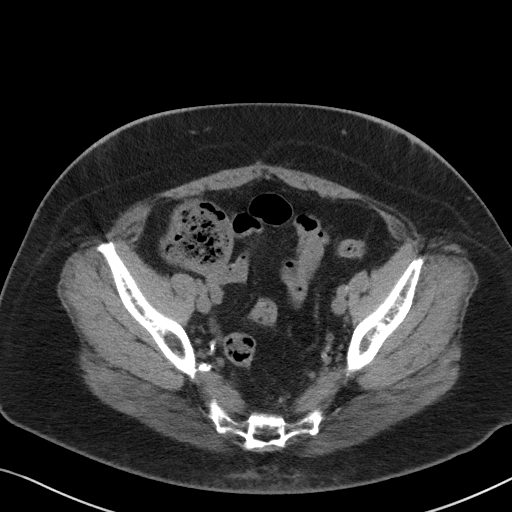
[im 34/90  soft-tissue]
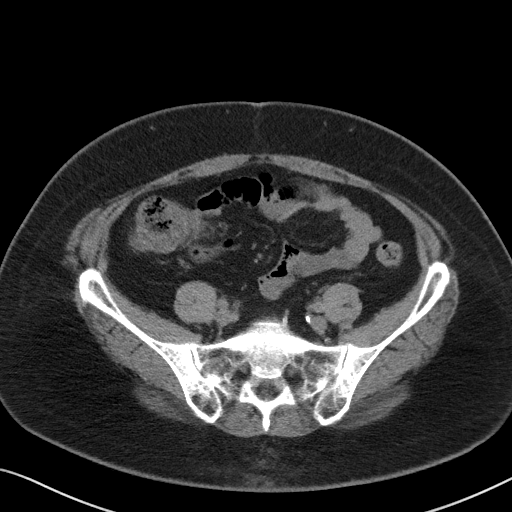
[im 41/90  soft-tissue]
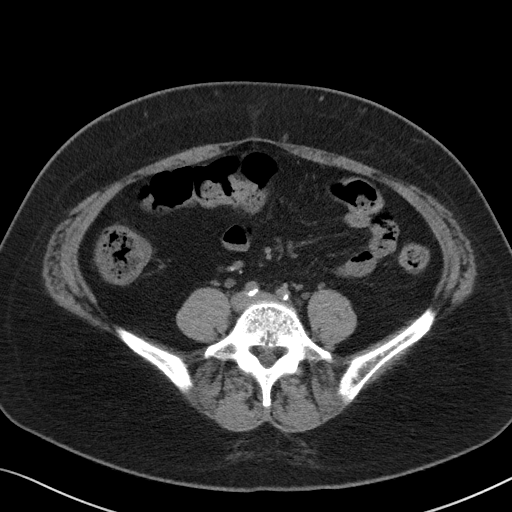
[im 49/90  soft-tissue]
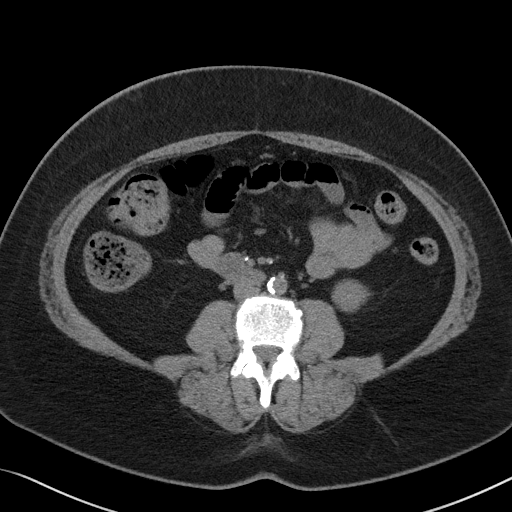
[im 56/90  soft-tissue]
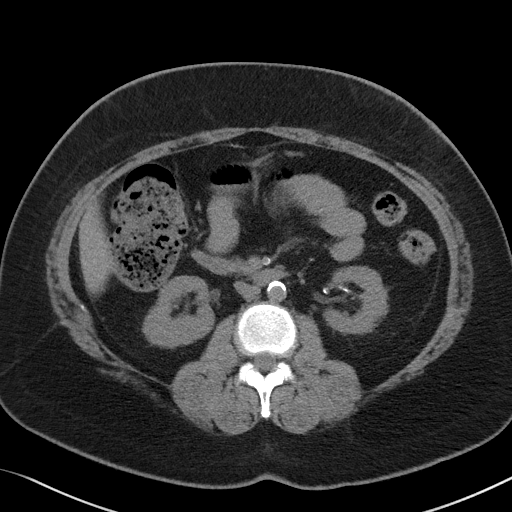
[im 64/90  soft-tissue]
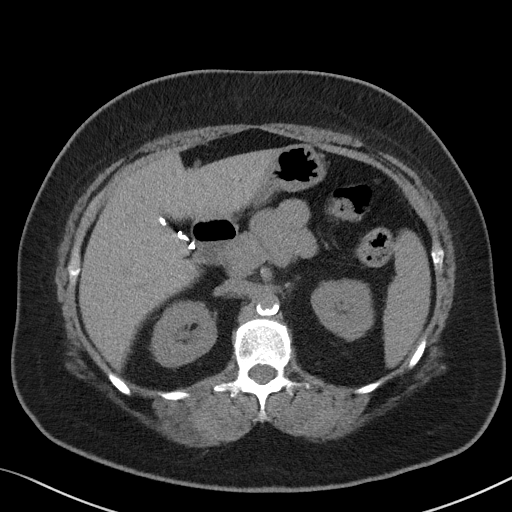
[im 64/90  bone]
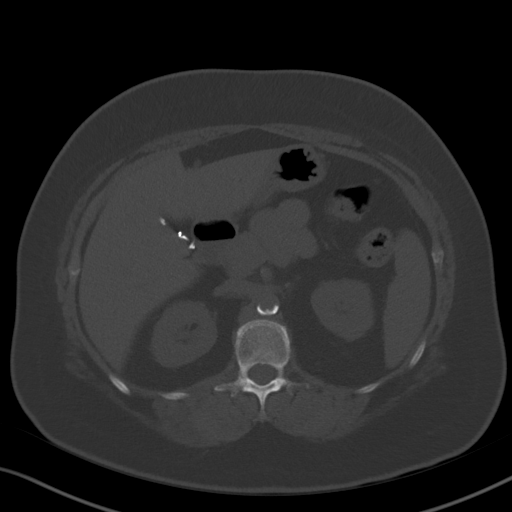
[im 71/90  soft-tissue]
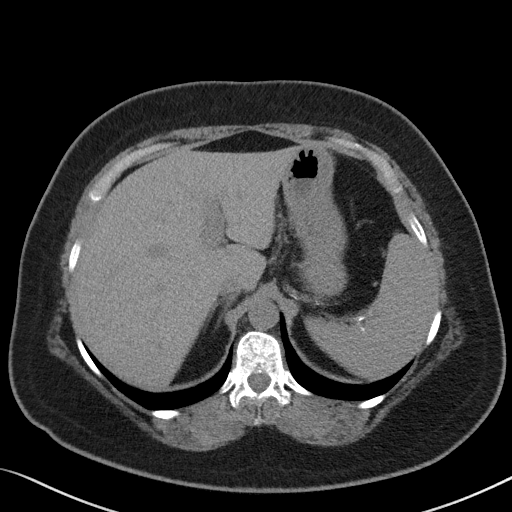
[im 75/90  lung]
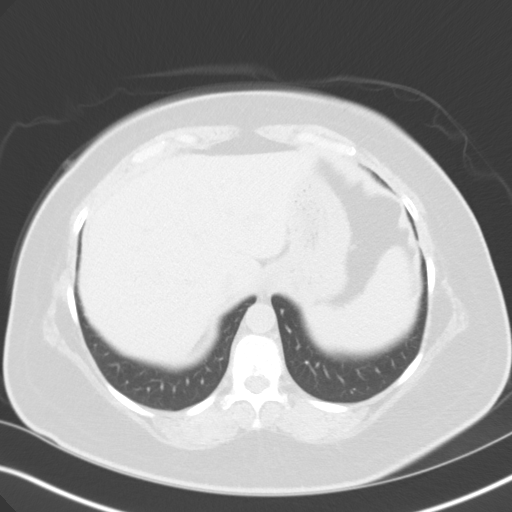
[im 78/90  soft-tissue]
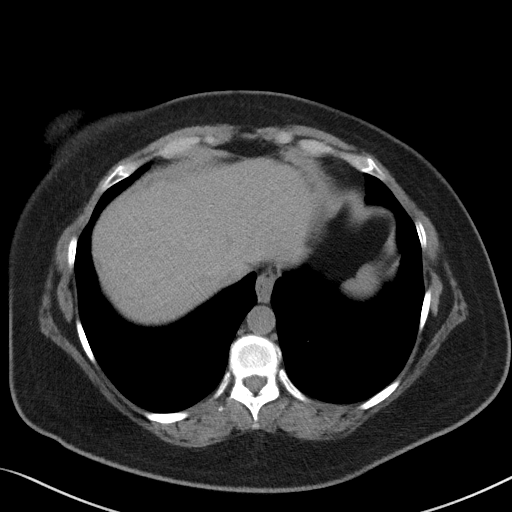
[im 78/90  lung]
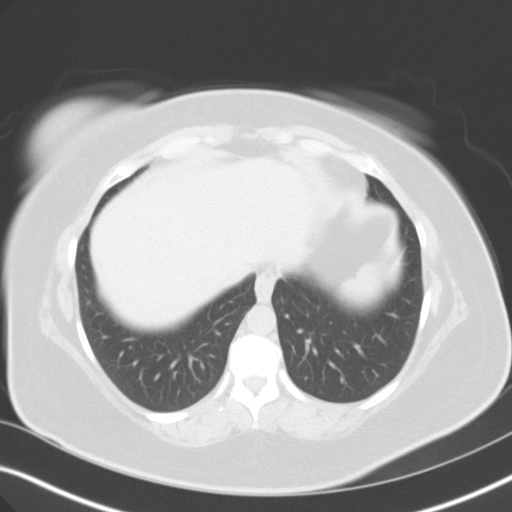
[im 82/90  lung]
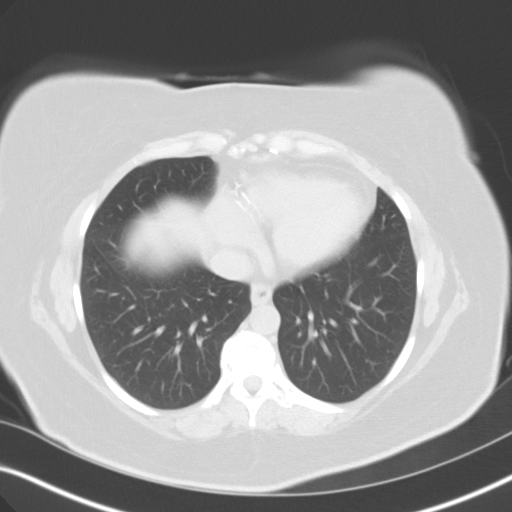
[im 86/90  soft-tissue]
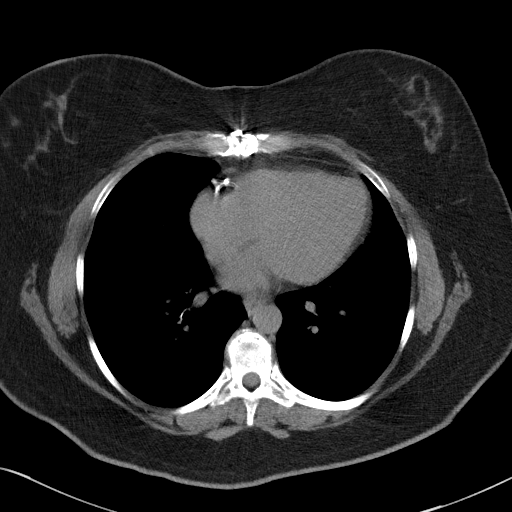
[im 86/90  lung]
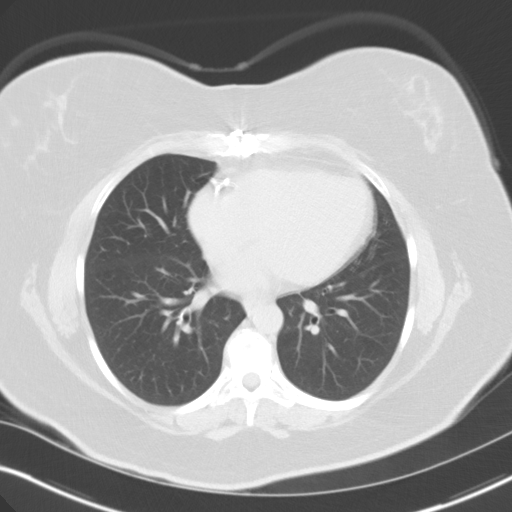

[14 of 32 positions shown; findings below may reference images not displayed]

FINDINGS: Evaluation of this exam is limited in the absence of intravenous
contrast.

Lower chest: There is a 3 mm left lower lobe subpleural nodule. The
visualized lung bases are otherwise clear. There is multi vessel
coronary vascular calcification.

No intra-abdominal free air or free fluid.

Hepatobiliary: The liver is unremarkable. No intrahepatic biliary
ductal dilatation. Cholecystectomy. No retained calcified stone
noted in the central CBD.

Pancreas: Unremarkable. No pancreatic ductal dilatation or
surrounding inflammatory changes.

Spleen: Normal in size without focal abnormality.

Adrenals/Urinary Tract: The adrenal glands are unremarkable. There
is no hydronephrosis on either side. Punctate nonobstructing right
renal calculi and bilateral renal vascular calcification. The
visualized ureters and urinary bladder appear unremarkable.

Stomach/Bowel: There is moderate stool throughout the colon. There
is no bowel obstruction. There is an apparent focal area of wall
thickening of the proximal ascending colon (series 2, image 54,
coronal series 5, image 38, and sagittal series 6, image 29). This
is likely related to underdistention. And infiltrative process or
mild focal colitis is not entirely excluded. Clinical correlation
and follow-up with colonoscopy or CT with oral contrast or barium
enema study after resolution of acute symptoms recommended. The
appendix is normal.

Vascular/Lymphatic: There is moderate aortoiliac atherosclerotic
disease. The IVC is unremarkable. No portal venous gas. There is no
adenopathy.

Reproductive: Hysterectomy. No adnexal masses.

Other: None

Musculoskeletal: Osteopenia. No acute osseous pathology.
IMPRESSION: 1. Apparent focal area of wall thickening of the proximal ascending
colon which may be related to underdistention or represent mild
focal colitis. An infiltrative process is not excluded. Clinical
correlation and follow-up with colonoscopy after resolution of acute
symptoms recommended. No bowel obstruction. Normal appendix.
2. Punctate nonobstructing right renal calculi. No hydronephrosis.
3. Aortic Atherosclerosis ([id number]).

## 2021-01-06 ENCOUNTER — Ambulatory Visit: Payer: 59 | Admitting: Physician Assistant

## 2021-01-06 ENCOUNTER — Encounter: Payer: Self-pay | Admitting: Physician Assistant

## 2021-01-06 ENCOUNTER — Other Ambulatory Visit: Payer: Self-pay

## 2021-01-06 ENCOUNTER — Encounter (INDEPENDENT_AMBULATORY_CARE_PROVIDER_SITE_OTHER): Payer: 59 | Admitting: Ophthalmology

## 2021-01-06 VITALS — BP 128/74 | HR 57 | Ht 63.0 in | Wt 175.6 lb

## 2021-01-06 DIAGNOSIS — I10 Essential (primary) hypertension: Secondary | ICD-10-CM | POA: Diagnosis not present

## 2021-01-06 DIAGNOSIS — G4709 Other insomnia: Secondary | ICD-10-CM

## 2021-01-06 DIAGNOSIS — M24522 Contracture, left elbow: Secondary | ICD-10-CM

## 2021-01-06 DIAGNOSIS — M24521 Contracture, right elbow: Secondary | ICD-10-CM | POA: Diagnosis not present

## 2021-01-06 DIAGNOSIS — M0609 Rheumatoid arthritis without rheumatoid factor, multiple sites: Secondary | ICD-10-CM

## 2021-01-06 DIAGNOSIS — Z8639 Personal history of other endocrine, nutritional and metabolic disease: Secondary | ICD-10-CM | POA: Diagnosis not present

## 2021-01-06 DIAGNOSIS — R5383 Other fatigue: Secondary | ICD-10-CM

## 2021-01-06 DIAGNOSIS — M5134 Other intervertebral disc degeneration, thoracic region: Secondary | ICD-10-CM | POA: Diagnosis not present

## 2021-01-06 DIAGNOSIS — R778 Other specified abnormalities of plasma proteins: Secondary | ICD-10-CM

## 2021-01-06 DIAGNOSIS — Z79899 Other long term (current) drug therapy: Secondary | ICD-10-CM

## 2021-01-06 DIAGNOSIS — Z8719 Personal history of other diseases of the digestive system: Secondary | ICD-10-CM

## 2021-01-06 DIAGNOSIS — E103593 Type 1 diabetes mellitus with proliferative diabetic retinopathy without macular edema, bilateral: Secondary | ICD-10-CM | POA: Diagnosis not present

## 2021-01-06 DIAGNOSIS — M5136 Other intervertebral disc degeneration, lumbar region: Secondary | ICD-10-CM

## 2021-01-06 DIAGNOSIS — M8589 Other specified disorders of bone density and structure, multiple sites: Secondary | ICD-10-CM | POA: Diagnosis not present

## 2021-01-06 DIAGNOSIS — Z8669 Personal history of other diseases of the nervous system and sense organs: Secondary | ICD-10-CM

## 2021-01-06 DIAGNOSIS — H35033 Hypertensive retinopathy, bilateral: Secondary | ICD-10-CM

## 2021-01-06 DIAGNOSIS — R202 Paresthesia of skin: Secondary | ICD-10-CM

## 2021-01-06 DIAGNOSIS — Z8679 Personal history of other diseases of the circulatory system: Secondary | ICD-10-CM

## 2021-01-06 DIAGNOSIS — L409 Psoriasis, unspecified: Secondary | ICD-10-CM

## 2021-01-06 LAB — COMPLETE METABOLIC PANEL WITH GFR
AG Ratio: 2.1 (calc) (ref 1.0–2.5)
ALT: 21 U/L (ref 6–29)
AST: 24 U/L (ref 10–35)
Albumin: 4.5 g/dL (ref 3.6–5.1)
Alkaline phosphatase (APISO): 77 U/L (ref 37–153)
BUN: 14 mg/dL (ref 7–25)
CO2: 29 mmol/L (ref 20–32)
Calcium: 9.4 mg/dL (ref 8.6–10.4)
Chloride: 104 mmol/L (ref 98–110)
Creat: 0.88 mg/dL (ref 0.50–1.05)
GFR, Est African American: 87 mL/min/{1.73_m2} (ref >=60)
GFR, Est Non African American: 75 mL/min/{1.73_m2} (ref >=60)
Globulin: 2.1 g/dL (calc) (ref 1.9–3.7)
Glucose, Bld: 135 mg/dL — ABNORMAL HIGH (ref 65–99)
Potassium: 4.3 mmol/L (ref 3.5–5.3)
Sodium: 141 mmol/L (ref 135–146)
Total Bilirubin: 0.5 mg/dL (ref 0.2–1.2)
Total Protein: 6.6 g/dL (ref 6.1–8.1)

## 2021-01-06 LAB — CBC WITH DIFFERENTIAL/PLATELET
Absolute Monocytes: 519 cells/uL (ref 200–950)
Basophils Absolute: 49 cells/uL (ref 0–200)
Basophils Relative: 0.8 %
Eosinophils Absolute: 183 cells/uL (ref 15–500)
Eosinophils Relative: 3 %
HCT: 36.2 % (ref 35.0–45.0)
Hemoglobin: 12.5 g/dL (ref 11.7–15.5)
Lymphs Abs: 2086 cells/uL (ref 850–3900)
MCH: 30.1 pg (ref 27.0–33.0)
MCHC: 34.5 g/dL (ref 32.0–36.0)
MCV: 87.2 fL (ref 80.0–100.0)
MPV: 10.2 fL (ref 7.5–12.5)
Monocytes Relative: 8.5 %
Neutro Abs: 3264 cells/uL (ref 1500–7800)
Neutrophils Relative %: 53.5 %
Platelets: 218 10*3/uL (ref 140–400)
RBC: 4.15 10*6/uL (ref 3.80–5.10)
RDW: 12.6 % (ref 11.0–15.0)
Total Lymphocyte: 34.2 %
WBC: 6.1 10*3/uL (ref 3.8–10.8)

## 2021-01-06 NOTE — Patient Instructions (Signed)
Standing Labs We placed an order today for your standing lab work.   Please have your standing labs drawn in April and every 3 months   If possible, please have your labs drawn 2 weeks prior to your appointment so that the provider can discuss your results at your appointment.  We have open lab daily Monday through Thursday from 8:30-12:30 PM and 1:30-4:30 PM and Friday from 8:30-12:30 PM and 1:30-4:00 PM at the office of Dr. Shaili Deveshwar, [facility].   Please be advised, patients with office appointments requiring lab work will take precedents over walk-in lab work.  If possible, please come for your lab work on Monday and Friday afternoons, as you may experience shorter wait times. The office is located at [address], [address], , [place] [address] No appointment is necessary.   Labs are drawn by [facility]. Please bring your co-pay at the time of your lab draw.  You may receive a bill from [facility] for your lab work.  If you wish to have your labs drawn at another location, please call the office 24 hours in advance to send orders.  If you have any questions regarding directions or hours of operation,  please call [phone].   As a reminder, please drink plenty of water prior to coming for your lab work. Thanks!   

## 2021-01-10 DIAGNOSIS — R609 Edema, unspecified: Secondary | ICD-10-CM

## 2021-01-11 ENCOUNTER — Ambulatory Visit: Payer: 59 | Admitting: Neurology

## 2021-01-11 ENCOUNTER — Ambulatory Visit (INDEPENDENT_AMBULATORY_CARE_PROVIDER_SITE_OTHER): Payer: 59

## 2021-01-11 ENCOUNTER — Other Ambulatory Visit: Payer: Self-pay

## 2021-01-11 ENCOUNTER — Encounter: Payer: Self-pay | Admitting: Neurology

## 2021-01-11 VITALS — BP 126/67 | HR 58 | Ht 63.0 in | Wt 176.0 lb

## 2021-01-11 DIAGNOSIS — E1042 Type 1 diabetes mellitus with diabetic polyneuropathy: Secondary | ICD-10-CM | POA: Diagnosis not present

## 2021-01-11 DIAGNOSIS — R0683 Snoring: Secondary | ICD-10-CM | POA: Diagnosis not present

## 2021-01-11 DIAGNOSIS — I480 Paroxysmal atrial fibrillation: Secondary | ICD-10-CM | POA: Diagnosis not present

## 2021-01-11 DIAGNOSIS — G43011 Migraine without aura, intractable, with status migrainosus: Secondary | ICD-10-CM | POA: Diagnosis not present

## 2021-01-11 DIAGNOSIS — Z951 Presence of aortocoronary bypass graft: Secondary | ICD-10-CM | POA: Diagnosis not present

## 2021-01-11 DIAGNOSIS — R2232 Localized swelling, mass and lump, left upper limb: Secondary | ICD-10-CM | POA: Diagnosis not present

## 2021-01-11 DIAGNOSIS — Z1231 Encounter for screening mammogram for malignant neoplasm of breast: Secondary | ICD-10-CM | POA: Diagnosis not present

## 2021-01-11 DIAGNOSIS — R202 Paresthesia of skin: Secondary | ICD-10-CM | POA: Diagnosis not present

## 2021-01-11 DIAGNOSIS — R200 Anesthesia of skin: Secondary | ICD-10-CM | POA: Diagnosis not present

## 2021-01-11 NOTE — Patient Instructions (Signed)

## 2021-01-11 NOTE — Progress Notes (Signed)
SLEEP MEDICINE CLINIC    Provider:  Larey Seat, MD  Primary Care Physician:  McLean-Scocuzza, Nino Glow, MD [place] [address]     Referring Provider:  Dr Marcial Pacas, MD   Mclean-scocuzza, Nino Glow, Md [address] [place],  [place] [address]          Chief Complaint according to patient   Patient presents with:    . New Patient (Initial Visit)           HISTORY OF PRESENT ILLNESS:  [name] is a 55 - year old  Caucasian female patient and seen on 01/11/2021 for a sleep consultation, .  Chief concern according to patient :    I have the pleasure of seeing [name] today, a right -handed caucasian female with a chronic sleep disorder, who  has a past medical history of ACUT MI ANTEROLAT WALL SUBSQT EPIS CARE, Acute maxillary sinusitis, ALLERGIC RHINITIS, SEASONAL, ARTHRITIS, RHEUMATOID,  Atrial fibrillation ([place]), SVT, Open heart surgery for BYPASS, triple vessel in 2010.  Back pain, Bruxism (teeth grinding), CAD, ARTERY BYPASS GRAFT, CARPAL TUNNEL SYNDROME, BILATERAL, CHOLELITHIASIS, Contrast media allergy, DERMATITIS, ALLERGIC, DIABETES MELLITUS, TYPE I, Insulin pump, DIABETIC  RETINOPATHY, Hiatal hernia, HYPERLIPIDEMIA-MIXED, HYPERTHYROIDISM, MIGRAINE W/O AURA W/INTRACT W/STATUS MIGRAINOSUS (02/19/2008), Psoriasis, SINUS TACHYCARDIA (11/08/2010), SVT (supraventricular tachycardia) ([place]), TRIGGER FINGER, and URI.   Sleep relevant medical history: Nocturia and sleep fragmentation, pain at night, neuropathy,  cervical spine injury, sinusitis .    Family medical /sleep history: mother on CPAP with OSA, father is a loud snorer, but [name] with insomnia.    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  and lives in a household with twin children- 43 years old. Family status is divorced.  The patient currently works day shift due to DM 1/ used to work in late shifts.  6 cats are present. Tobacco use -never .  ETOH use ; rare ,  Caffeine  intake in form of Coffee(/) Soda( diet coke). Regular exercise ; none -       Sleep habits are as follows: The patient's dinner time is between 5-6 PM. The patient goes to bed at 9 PM and continues to sleep for intervals of 1-2 hours, wakes for frequent bathroom breaks and is woken by the insulin pump. She reportedly snores. The bedroom is cool, quiet and dark.  The preferred sleep position is prone , without the support of any pillows.  Dreams are reportedly rare. 5.20 AM is the usual rise time. The patient wakes up with an alarm.  She reports not feeling refreshed or restored in AM, with symptoms such as dry mouth , morning headaches.. Naps are taken infrequently, those interfere with nocturnal sleep and are less refreshing than nocturnal sleep.    Review of Systems: Out of a complete 14 system review, the patient complains of only the following symptoms, and all other reviewed systems are negative.:  Fatigue, sleepiness , snoring, fragmented sleep, Insomnia   Rheumatoid arthritis.    How likely are you to doze in the following situations: 0 = not likely, 1 = slight chance, 2 = moderate chance, 3 = high chance   Sitting and Reading? Watching Television? Sitting inactive in a public place (theater or meeting)? As a passenger in a car for an hour without a break? Lying down in the afternoon when circumstances permit? Sitting and talking to someone? Sitting quietly after lunch without alcohol? In a car,  while stopped for a few minutes in traffic?   Total = 10/ 24 points   FSS endorsed at 42/ 63 points.   GDS-   Social History   Socioeconomic History  . Marital status: Divorced    Spouse name: Not on file  . Number of children: Not on file  . Years of education: Not on file  . Highest education level: Not on file  Occupational History  . Not on file  Tobacco Use  . Smoking status: Never Smoker  . Smokeless tobacco: Never Used  Vaping Use  . Vaping Use: Never used   Substance and Sexual Activity  . Alcohol use: Yes    Comment: rarely 1 every 6 months  . Drug use: No  . Sexual activity: Not Currently    Partners: Male    Birth control/protection: None  Other Topics Concern  . Not on file  Social History Narrative   Divorced. Has 2 kids(fraternal twins) daughters age 20. Works at [employer], Never smoked, denies ETOH, no drugs. Drinks diet coke. No exercise.       DPR daughters [name] and Elaynah Aring twins          Social Determinants of Health   Financial Resource Strain: Not on file  Food Insecurity: Not on file  Transportation Needs: Not on file  Physical Activity: Not on file  Stress: Not on file  Social Connections: Not on file    Family History  Problem Relation Age of Onset  . Depression Mother   . Anxiety disorder Mother   . Colon polyps Father   . Diabetes Father        2  . Hypertension Father   . Parkinson's disease Father   . Diabetes Other   . Heart disease Other   . Hypertension Other   . Hyperlipidemia Other   . Depression Other   . Migraines Other   . Stroke Paternal Grandmother   . Heart attack Neg Hx   . Colon cancer Neg Hx   . Stomach cancer Neg Hx   . Esophageal cancer Neg Hx     Past Medical History:  Diagnosis Date  . ACUT MI ANTEROLAT WALL SUBSQT EPIS CARE   . Acute maxillary sinusitis   . ALLERGIC RHINITIS, SEASONAL   . ARTHRITIS, RHEUMATOID    shoulders and hands Enbrel>leg swelling Dr. Estanislado Pandy  . Atrial fibrillation ([place])    a. after CABG.  . Back pain   . Bruxism (teeth grinding)   . CAD, ARTERY BYPASS GRAFT    a. DES to RCA in 2010 then LAD occlusion s/p CABG 3 06/07/2009 with LIMA to LAD, reverse SVG to D1, reverse SVG to distal RCA. b. Cath 05/08/2016 slightly hypodense region in the intermediate branch, however she had excellent flow, FFR was normal. Vein graft to PDA and the posterolateral branch is patent, patent LIMA to LAD, occluded SVG to diagonal.  . CARPAL TUNNEL  SYNDROME, BILATERAL   . CHOLELITHIASIS   . Contrast media allergy   . DERMATITIS, ALLERGIC   . DIABETES MELLITUS, TYPE I    on insulin pump dx'ed age 31 y.o   . DIABETIC  RETINOPATHY   . Hiatal hernia   . HYPERLIPIDEMIA-MIXED   . HYPERTHYROIDISM    Dr. Dollene Cleveland  . Insulin pump in place   . MIGRAINE W/O AURA W/INTRACT W/STATUS MIGRAINOSUS 02/19/2008  . Psoriasis   . SINUS TACHYCARDIA 11/08/2010  . SVT (supraventricular tachycardia) ([place])    after  s/p CABG  . TRIGGER FINGER    all fingers b/l hands   . URI     Past Surgical History:  Procedure Laterality Date  . ABDOMINAL HYSTERECTOMY     endometriomas b/l; total ? cervix removed; no h/o abnormal pap  . Caesarean section    . CARDIAC CATHETERIZATION N/A 05/08/2016   Procedure: Left Heart Cath and Cors/Grafts Angiography;  Surgeon: Burnell Blanks, MD;  Location: [facility];  Service: Cardiovascular;  Laterality: N/A;  . CARPAL TUNNEL RELEASE     b/l   . CATARACT EXTRACTION, BILATERAL    . CHOLECYSTECTOMY    . COLONOSCOPY WITH PROPOFOL N/A 03/25/2020   Procedure: COLONOSCOPY WITH PROPOFOL;  Surgeon: Jonathon Bellows, MD;  Location: [facility] ENDOSCOPY;  Service: Gastroenterology;  Laterality: N/A;  . CORONARY ARTERY BYPASS GRAFT    . ESOPHAGOGASTRODUODENOSCOPY (EGD) WITH PROPOFOL N/A 03/25/2020   Procedure: ESOPHAGOGASTRODUODENOSCOPY (EGD) WITH PROPOFOL;  Surgeon: Jonathon Bellows, MD;  Location[facility] END[facility]COPY;  Service: Gastroenterology;  Laterality: N/A;  . EYE SURGERY     laser x 2 for retinopathy   . LEFT HEART CATH AND CORS/GRAFTS ANGIOGRAPHY N/A 02/22/2020   Procedure: LEFT HEART CATH AND CORS/GRAFTS ANGIOGRAPHY;  Surgeon: Burnell Blanks, MD;  Location: [facility];  Service: Cardiovascular;  Laterality: N/A;  . TRIGGER FINGER RELEASE     b/l fingers all   . VITRECTOMY     b/l      Current Outpatient Medications on File Prior to Visit  Medication Sig Dispense Refill  . amLODipine (NORVASC) 5 MG  tablet Take 0.5 tablets (2.5 mg total) by mouth daily. 45 tablet 3  . aspirin EC 81 MG tablet Take 81 mg by mouth daily.    . CHELATED MAGNESIUM PO Take 250 mg by mouth daily.    . clopidogrel (PLAVIX) 75 MG tablet TAKE 1 TABLET BY MOUTH ONCE DAILY 90 tablet 3  . cyclobenzaprine (FLEXERIL) 10 MG tablet TAKE 1 TABLET BY MOUTH AT BEDTIME 30 tablet 0  . EPINEPHrine 0.3 mg/0.3 mL IJ SOAJ injection Inject 0.3 mLs (0.3 mg total) into the muscle as needed for anaphylaxis. 1 Device prn  . folic acid (FOLVITE) 1 MG tablet TAKE 2 TABLETS BY MOUTH DAILY. 180 tablet 1  . furosemide (LASIX) 40 MG tablet Take 1 tablet (40 mg total) by mouth daily as needed for edema. (Patient taking differently: Take 40 mg by mouth daily.) 90 tablet 3  . insulin lispro (HUMALOG) 100 UNIT/ML injection Inject 80-90 Units into the skin continuous. FOR USE IN INSULIN PUMP. TOTAL DAILY INSULIN DOSE = UP TO 90 UNITS.    Marland Kitchen isosorbide mononitrate (IMDUR) 60 MG 24 hr tablet TAKE 1 TABLET (60 MG TOTAL) BY MOUTH DAILY. 90 tablet 3  . Krill Oil 500 MG CAPS Take 500 mg by mouth daily.    Marland Kitchen leflunomide (ARAVA) 20 MG tablet TAKE 1 TABLET BY MOUTH DAILY 90 tablet 0  . linaclotide (LINZESS) 290 MCG CAPS capsule Take 1 capsule (290 mcg total) by mouth daily before breakfast. 90 capsule 3  . losartan (COZAAR) 100 MG tablet Take 1 tablet (100 mg total) by mouth daily. 30 tablet 11  . metoprolol tartrate (LOPRESSOR) 50 MG tablet TAKE 1 TABLET BY MOUTH TWICE DAILY 180 tablet 3  . omeprazole (PRILOSEC) 40 MG capsule     . ondansetron (ZOFRAN-ODT) 4 MG disintegrating tablet Take 4 mg by mouth every 8 (eight) hours as needed.    . ONE TOUCH ULTRA TEST  test strip   1  . pantoprazole (PROTONIX) 40 MG tablet Take 1 tablet (40 mg total) by mouth daily. 30 tablet 11  . potassium chloride (KLOR-CON) 10 MEQ tablet Take 1 tablet (10 mEq total) by mouth daily. 90 tablet 3  . promethazine (PHENERGAN) 25 MG tablet Take 0.5-1 tablets (12.5-25 mg total) by mouth 2  (two) times daily as needed for nausea or vomiting. 60 tablet 2  . rosuvastatin (CRESTOR) 20 MG tablet Take 1 tablet (20 mg total) by mouth daily. 90 tablet 3  . Upadacitinib ER (RINVOQ) 15 MG TB24 Take 15 mg by mouth daily. 90 tablet 0  . dicyclomine (BENTYL) 20 MG tablet Take 1 tablet (20 mg total) by mouth 3 (three) times daily as needed for spasms (abdominal pain). 270 tablet 0   No current facility-administered medications on file prior to visit.    Allergies  Allergen Reactions  . Actemra [Tocilizumab]   . Prochlorperazine Rash    Neuro problems per pt  . Ramipril Swelling, Rash and Other (See Comments)  . Shellfish-Derived Products Swelling    Shrimp   . Atorvastatin Rash    Elevated LFT's  . Compazine  [Prochlorperazine Edisylate] Other (See Comments)    Neurological reaction  . Emgality [Galcanezumab-Gnlm] Hives and Swelling  . Etanercept Swelling and Rash  . Infliximab Rash  . Iohexol     Iv contrast dye -rash all over  . Orencia [Abatacept] Rash  . Prochlorperazine Edisylate     unknown  . Shellfish Allergy Swelling    Shrimp(Facial swelling)  . Tofacitinib Rash and Other (See Comments)    Severe abdominal pain   . [employer]]     [employer], memory issues, word finding issues  . Amiodarone Nausea Only  . Rituximab Rash    Causes a rash    Physical exam:  Today's Vitals   01/11/21 1012  BP: 126/67  Pulse: (!) 58  Weight: 176 lb (79.8 kg)  Height: 5\' 3"  (1.6 m)   Body mass index is 31.18 kg/m.   Wt Readings from Last 3 Encounters:  01/11/21 176 lb (79.8 kg)  01/06/21 175 lb 9.6 oz (79.7 kg)  12/15/20 175 lb 3.2 oz (79.5 kg)     Ht Readings from Last 3 Encounters:  01/11/21 5\' 3"  (1.6 m)  01/06/21 5\' 3"  (1.6 m)  12/15/20 5\' 3"  (1.6 m)      General: The patient is awake, alert and appears not in acute distress. The patient is well groomed. Head: Normocephalic, atraumatic. Neck is supple. Mallampati 2 with elongated uvula. Pale ,   neck circumference:15.5  inches .  Nasal airflow is patent.  Retrognathia is not seen.  Dental status: broken tooth from bruxism. No TMJ clicks.  Cardiovascular:  Regular rate and cardiac rhythm by pulse,  without distended neck veins. Respiratory: Lungs are clear to auscultation.  Skin:  Without evidence of ankle edema, or rash. Trunk: The patient's posture is erect.   Neurologic exam : The patient is awake and alert, oriented to place and time.   Memory subjective described as intact.  Attention span & concentration ability appears normal.  Speech is fluent,  without  dysarthria, dysphonia or aphasia.  Mood and affect are appropriate.   Cranial nerves: no loss of smell or taste reported  Pupils are equal and briskly reactive to light. Funduscopic exam deferred. Bilateral vitrectomy.  Marland Kitchen  Extraocular movements in vertical and horizontal planes were intact and without nystagmus. No Diplopia.  Visual fields by finger perimetry are intact. Hearing was intact to soft voice and finger rubbing.    Facial sensation intact to fine touch.  Facial motor strength is symmetric and tongue and uvula move midline.  Neck ROM : rotation, tilt and flexion extension were normal for age and shoulder shrug was symmetrical.    Motor exam:  Symmetric bulk, tone and ROM.   Normal tone without cog wheeling, symmetrically decreased  grip strength, ROM restriction from rheumatoid arthritis.    Sensory:  Fine touch, pinprick and vibration were tested  and  normal.  Proprioception tested in the upper extremities was normal.   Coordination: Rapid alternating movements in the fingers/hands were of normal speed.  The Finger-to-nose maneuver was intact without evidence of ataxia, dysmetria or tremor.   Gait and station: Patient could rise unassisted from a seated position, walked without assistive device.  Stance is of normal width/ base and the patient turned with 3 steps.  Toe and heel walk were deferred.   Deep tendon reflexes: in the  upper and lower extremities are attenuated, not even trace on patella and achilles level.  Babinski response was deferred .      After spending a total time of 40 minutes face to face and additional time for physical and neurologic examination, review of laboratory studies,  personal review of imaging studies, reports and results of other testing and review of referral information / records as far as provided in visit, I have established the following assessments:  Mrs. Felling presents with 2 manifestations of autoimmune disease diabetes type 1 with insulin pump in place, and rheumatoid arthritis.  Both conditions have led to sleep fragmentation there is some discomfort and sometimes flank pain that wakes her up.  The insulin pump itself does require recalibration quite often and this can also lead to additional arousals from sleep.  She has been told that she snores but this seems not to be every night the case and she prefers to sleep in a prone position which makes muffled snoring.  Her BMI is elevated but she is not morbidly obese she is treated for hypertension and has bradycardia on medication.  Her headaches seem to be mostly migrainous headaches that arise as the day goes on and are associated with some nausea sometimes visual changes and phonophobia.  Headaches can also be dull and nonspecific present in the morning when she wakes up but she is usually not woken by headaches.    My Plan is to proceed with:  Migraine and vertigo explanation, possible triggers in sleep?   1) screening for OSA by HST or PSG,  2) screening for hypoxemia in a patient with metabolic disorder.  3) screening for PLMs, in the setting of neuropathy.   I would like to thank McLean-Scocuzza, Nino Glow, MD and Mclean-scocuzza, Nino Glow, Md [place],  [place] [address] for allowing me to meet with and to take care of this pleasant patient.    I plan to follow up either  personally or through our NP within 3 month.     Electronically signed by: Larey Seat, MD 01/11/2021 10:22 AM  [facility] and [facility] Board certified by The [employer] of Sleep Medicine and Diplomate of the [employer] of Sleep Medicine. Board certified In Neurology through the [place], Fellow of the [employer] of Neurology. Medical Director of [employer].

## 2021-01-12 ENCOUNTER — Encounter: Payer: Self-pay | Admitting: Neurology

## 2021-01-12 ENCOUNTER — Other Ambulatory Visit: Payer: Self-pay | Admitting: Cardiovascular Disease

## 2021-01-12 MED ORDER — FUROSEMIDE 40 MG PO TABS: 60.0000 mg | ORAL_TABLET | Freq: Every day | ORAL | 3 refills | Status: DC | PRN

## 2021-01-13 ENCOUNTER — Other Ambulatory Visit: Payer: Self-pay

## 2021-01-13 DIAGNOSIS — R202 Paresthesia of skin: Secondary | ICD-10-CM

## 2021-01-18 ENCOUNTER — Other Ambulatory Visit: Payer: Self-pay | Admitting: Orthopedic Surgery

## 2021-01-18 ENCOUNTER — Other Ambulatory Visit (HOSPITAL_COMMUNITY): Payer: Self-pay | Admitting: Orthopedic Surgery

## 2021-01-18 DIAGNOSIS — M25832 Other specified joint disorders, left wrist: Secondary | ICD-10-CM

## 2021-01-19 ENCOUNTER — Other Ambulatory Visit: Payer: Self-pay | Admitting: Cardiovascular Disease

## 2021-01-19 DIAGNOSIS — R600 Localized edema: Secondary | ICD-10-CM

## 2021-01-20 ENCOUNTER — Other Ambulatory Visit: Payer: Self-pay | Admitting: Physician Assistant

## 2021-01-20 DIAGNOSIS — M0609 Rheumatoid arthritis without rheumatoid factor, multiple sites: Secondary | ICD-10-CM

## 2021-01-20 NOTE — Telephone Encounter (Signed)
Last Visit: 01/06/2021 Next Visit: 06/08/2021 Labs: 01/06/2021, Glucose is 135. Rest of CMP WNL. CBC WNL.  TB Gold: 10/10/2020  Current Dose per office note 01/06/2021, Rinvoq 15 mg 1 tablet by mouth daily  DX: Rheumatoid arthritis of multiple sites with negative rheumatoid factor  Okay to refill Rinvoq?

## 2021-01-23 ENCOUNTER — Other Ambulatory Visit: Payer: Self-pay | Admitting: Physician Assistant

## 2021-01-23 ENCOUNTER — Other Ambulatory Visit: Payer: Self-pay | Admitting: Pharmacist

## 2021-01-23 DIAGNOSIS — M0609 Rheumatoid arthritis without rheumatoid factor, multiple sites: Secondary | ICD-10-CM

## 2021-01-23 MED ORDER — RINVOQ 15 MG PO TB24: ORAL_TABLET | ORAL | 2 refills | Status: DC

## 2021-01-23 MED ORDER — CYCLOBENZAPRINE HCL 10 MG PO TABS: 10.0000 mg | ORAL_TABLET | Freq: Every day | ORAL | 2 refills | Status: DC

## 2021-01-23 NOTE — Telephone Encounter (Signed)
Last Visit: 1//06/2021 Next Visit: 06/08/2021  Current Dose per office note on 01/06/2021, flexeril 10 mg at bedtime as needed for insomnia and muscle spasms.  DX: Rheumatoid arthritis of multiple sites with negative rheumatoid factor and insomnia  Okay to refill Flexeril?

## 2021-01-24 ENCOUNTER — Other Ambulatory Visit: Payer: Self-pay

## 2021-01-24 ENCOUNTER — Ambulatory Visit
Admission: RE | Admit: 2021-01-24 | Discharge: 2021-01-24 | Disposition: A | Discharge status: Home / Self Care | Payer: 59 | Source: Ambulatory Visit | Attending: Orthopedic Surgery | Admitting: Orthopedic Surgery

## 2021-01-24 DIAGNOSIS — R2232 Localized swelling, mass and lump, left upper limb: Secondary | ICD-10-CM | POA: Diagnosis not present

## 2021-01-24 DIAGNOSIS — E10319 Type 1 diabetes mellitus with unspecified diabetic retinopathy without macular edema: Secondary | ICD-10-CM | POA: Diagnosis not present

## 2021-01-24 DIAGNOSIS — E109 Type 1 diabetes mellitus without complications: Secondary | ICD-10-CM | POA: Diagnosis not present

## 2021-01-24 DIAGNOSIS — M25832 Other specified joint disorders, left wrist: Secondary | ICD-10-CM | POA: Diagnosis not present

## 2021-01-24 DIAGNOSIS — E1065 Type 1 diabetes mellitus with hyperglycemia: Secondary | ICD-10-CM | POA: Diagnosis not present

## 2021-01-24 DIAGNOSIS — E1039 Type 1 diabetes mellitus with other diabetic ophthalmic complication: Secondary | ICD-10-CM | POA: Diagnosis not present

## 2021-01-25 ENCOUNTER — Other Ambulatory Visit: Payer: Self-pay | Admitting: Rheumatology

## 2021-01-25 ENCOUNTER — Other Ambulatory Visit: Payer: Self-pay | Admitting: Physician Assistant

## 2021-01-25 NOTE — Telephone Encounter (Signed)
Last Visit: 1/72/022 Next Visit: 06/08/2021 Labs: 01/06/2021, Glucose is 135. Rest of CMP WNL. CBC WNL Current Dose per office note 01/06/2021, Arava 20 mg 1 tablet by mouth daily DX: Rheumatoid arthritis of multiple sites with negative rheumatoid factor  Okay to refill Arava?

## 2021-01-26 ENCOUNTER — Ambulatory Visit (INDEPENDENT_AMBULATORY_CARE_PROVIDER_SITE_OTHER): Payer: 59

## 2021-01-26 ENCOUNTER — Other Ambulatory Visit: Payer: Self-pay

## 2021-01-26 DIAGNOSIS — R600 Localized edema: Secondary | ICD-10-CM | POA: Diagnosis not present

## 2021-01-26 LAB — ECHOCARDIOGRAM COMPLETE
AR max vel: 1.35 cm2
AV Area VTI: 1.37 cm2
AV Area mean vel: 1.36 cm2
AV Mean grad: 4 mmHg
AV Peak grad: 7.1 mmHg
Ao pk vel: 1.33 m/s
Area-P 1/2: 4.06 cm2
S' Lateral: 2.5 cm

## 2021-01-26 MED ORDER — PERFLUTREN LIPID MICROSPHERE
1.0000 mL | INTRAVENOUS | Status: AC | PRN
  Administered 2021-01-26: 2 mL via INTRAVENOUS

## 2021-02-01 ENCOUNTER — Ambulatory Visit (INDEPENDENT_AMBULATORY_CARE_PROVIDER_SITE_OTHER): Payer: 59 | Admitting: Neurology

## 2021-02-01 DIAGNOSIS — E1042 Type 1 diabetes mellitus with diabetic polyneuropathy: Secondary | ICD-10-CM

## 2021-02-01 DIAGNOSIS — G4733 Obstructive sleep apnea (adult) (pediatric): Secondary | ICD-10-CM | POA: Diagnosis not present

## 2021-02-01 DIAGNOSIS — R0683 Snoring: Secondary | ICD-10-CM

## 2021-02-01 DIAGNOSIS — I480 Paroxysmal atrial fibrillation: Secondary | ICD-10-CM

## 2021-02-01 DIAGNOSIS — G43011 Migraine without aura, intractable, with status migrainosus: Secondary | ICD-10-CM

## 2021-02-01 DIAGNOSIS — Z951 Presence of aortocoronary bypass graft: Secondary | ICD-10-CM

## 2021-02-02 NOTE — Progress Notes (Signed)
I have reviewed and agreed above plan. 

## 2021-02-06 ENCOUNTER — Other Ambulatory Visit: Payer: Self-pay | Admitting: Family Medicine

## 2021-02-06 ENCOUNTER — Other Ambulatory Visit: Payer: Self-pay | Admitting: Physician Assistant

## 2021-02-06 ENCOUNTER — Other Ambulatory Visit: Payer: Self-pay | Admitting: Cardiovascular Disease

## 2021-02-06 ENCOUNTER — Other Ambulatory Visit: Payer: Self-pay | Admitting: Pharmacist

## 2021-02-06 DIAGNOSIS — M0609 Rheumatoid arthritis without rheumatoid factor, multiple sites: Secondary | ICD-10-CM

## 2021-02-06 MED ORDER — RINVOQ 15 MG PO TB24: ORAL_TABLET | ORAL | 2 refills | Status: DC

## 2021-02-06 MED FILL — RINVOQ 15 MG TB24: 15 | 30 days supply | Qty: 30 | Fill #0

## 2021-02-09 ENCOUNTER — Telehealth: Payer: Self-pay

## 2021-02-09 NOTE — Progress Notes (Signed)
   [facility] ([facility])  STUDY DATE: 02/09/21  DOB: December 31, 1966  MRN: [id number]  ORDERING CLINICIAN: Larey Seat, MD   REFERRING CLINICIAN: McLean-Scocuzza, Nino Glow, MD   CLINICAL INFORMATION/HISTORY: [name] isa right -handed caucasian female with a chronic sleep disorder, who has a past medical history of MI ANTEROLAT WALL SUBSQT EPIS CARE, Acute maxillary sinusitis, ALLERGIC RHINITIS, SEASONAL, ARTHRITIS, RHEUMATOID, Atrial fibrillation ([place]), SVT, Open heart surgery for BYPASS, triple vessel in 2010, DIABETES MELLITUS, TYPE I, Insulin pump, DIABETIC  RETINOPATHY, Hiatal hernia, HYPERLIPIDEMIA-MIXED, HYPERTHYROIDISM, MIGRAINE W/O AURA W/INTRACT W/STATUS MIGRAINOSUS (02/19/2008), Sleep related headaches.  Epworth sleepiness score: 10/24. BMI: 31.3 kg/m Neck Circumference: 15.5 "  FINDINGS:   Total Record Time (hours, min): 8 h 20 min Total Sleep Time (hours, min):  6 h 41 min  Percent REM (%):    34.49 %   Calculated pAHI (per hour): 31.6       REM pAHI: 67.0    NREM pAHI: 27.5 Supine AHI: N/A   Oxygen Saturation (%) Mean: 92  Minimum oxygen saturation (%):         81   O2 Saturation Range (%): 81-98  O2Saturation (minutes) <=88%: 1.1 min  Pulse Mean (bpm):    71  Pulse Range (45-100)   IMPRESSION:This HST confirmed the presence of  Severe OSA (obstructive sleep apnea) at AHI of 31.6/h. There is strong exacerbation during REM sleep but no significant hypoxia associated.    RECOMMENDATION: I recommend CPAP therapy for this degree and constellation of REM sleep dependent apnea. The autotitration device would be set to 5-17 cm water pressure, 2 cm EPR with heated humidification and mask of patient's choice.   INTERPRETING PHYSICIAN:  Larey Seat, MD  [facility] and [facility] Sleep Board certified by The [employer] of Sleep Medicine and Diplomate of the [employer] of Sleep Medicine. Board certified  In Neurology through the [place], Fellow of the [employer] of Neurology. Medical Director of [employer].

## 2021-02-09 NOTE — Telephone Encounter (Signed)
Ok to send in prednisone starting at 10 mg tapering by 2.5 mg every 4 days.  Please advise the patient to monitor blood glucose level closely.  Avoid taking NSAIDs while on prednisone and please advise to take prednisone with breakfast in the morning.  If her joint pain persists please schedule a sooner office visit for further evaluation.

## 2021-02-09 NOTE — Telephone Encounter (Signed)
Patient called stating she is having difficulty getting her Rinvoq delivered.  Patient states she spoke with the pharmacist at [facility] who is checking on the shipment.  Patient states she was due to take her medication 4 days ago and requesting to stop by the office for a sample.

## 2021-02-09 NOTE — Telephone Encounter (Signed)
Patient's prescription missed courier pick up. Pharmacy staff will deliver patient's med to [facility] today. Called patient and advised. No sample is needed.

## 2021-02-09 NOTE — Telephone Encounter (Signed)
Patient is having total body pain x 2 days, bilateral knee pain, bilateral hip pain, patient requesting prednisone,  [facility] [place],   Patient is having a nerve conduction study performed next week. Will prednisone interfere with that?

## 2021-02-10 ENCOUNTER — Other Ambulatory Visit: Payer: Self-pay | Admitting: Physician Assistant

## 2021-02-10 MED ORDER — PREDNISONE 5 MG PO TABS: ORAL_TABLET | ORAL | 0 refills | Status: DC

## 2021-02-15 ENCOUNTER — Ambulatory Visit (INDEPENDENT_AMBULATORY_CARE_PROVIDER_SITE_OTHER): Payer: 59 | Admitting: Pharmacist

## 2021-02-15 ENCOUNTER — Other Ambulatory Visit: Payer: Self-pay

## 2021-02-15 ENCOUNTER — Encounter: Payer: Self-pay | Admitting: Pharmacist

## 2021-02-15 VITALS — BP 118/68 | HR 63

## 2021-02-15 DIAGNOSIS — I152 Hypertension secondary to endocrine disorders: Secondary | ICD-10-CM

## 2021-02-15 DIAGNOSIS — E1159 Type 2 diabetes mellitus with other circulatory complications: Secondary | ICD-10-CM

## 2021-02-15 DIAGNOSIS — G5602 Carpal tunnel syndrome, left upper limb: Secondary | ICD-10-CM | POA: Diagnosis not present

## 2021-02-15 DIAGNOSIS — G5622 Lesion of ulnar nerve, left upper limb: Secondary | ICD-10-CM | POA: Diagnosis not present

## 2021-02-15 LAB — BASIC METABOLIC PANEL
BUN/Creatinine Ratio: 17 (ref 9–23)
BUN: 15 mg/dL (ref 6–24)
CO2: 22 mmol/L (ref 20–29)
Calcium: 9.1 mg/dL (ref 8.7–10.2)
Chloride: 100 mmol/L (ref 96–106)
Creatinine, Ser: 0.9 mg/dL (ref 0.57–1.00)
GFR calc Af Amer: 84 mL/min/{1.73_m2} (ref >59)
GFR calc non Af Amer: 73 mL/min/{1.73_m2} (ref >59)
Glucose: 105 mg/dL — ABNORMAL HIGH (ref 65–99)
Potassium: 4 mmol/L (ref 3.5–5.2)
Sodium: 139 mmol/L (ref 134–144)

## 2021-02-15 MED ORDER — METOPROLOL TARTRATE 50 MG PO TABS: 25.0000 mg | ORAL_TABLET | Freq: Two times a day (BID) | ORAL | 3 refills | Status: DC

## 2021-02-15 NOTE — Progress Notes (Signed)
Patient ID: TIARRA [name]                 DOB: 05-30-1966                      MRN: [id number]     HPI: [name] is a 55 y.o. female referred by Dr. Burt Knack to HTN clinic. PMH is significant for T1DM, hx of CABG, RA, DLD, Graves disease, and diastolic heart failure.  Recently she has been complaining of increased lower extremity edema and furosemide has been steadily increased.  Patient had normal echocardiogram and amlodipine was discontinued to see if that was the cause of the swelling.  Had normal labs on 01/06/21.  Patient presents today in good spirits. Is concerned regarding her thyroid due to her Graves disease.  Has gained weight recently despite eating the same foods.  Thinks that possibly her recent issues are related to her becoming more hypothyroid than hyperthyroid.  Has also needed to inject more insulin to cover her meals.  Wears an Minimed insulin infusion pump and has a continuous glucose monitor.  Has not noticed.  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 Reports this makes LEE worse since she is not able to elevate her legs.  Has not noticed a decrease in swelling since discontinuing amlodipine.  Of note, patient was only taking amlodipine 2.5mg  rather than 5 mg.    Does not sleep well at night.  Recently it is because of hand pain (which she just received a steroid injection) or because of having to frequently urinate.  Estimated she gets about 3 hours of sleep per night.    Does not typically eat breakfast. May eat some peanut butter crackers at work, [place] slider at lunch and then larger dinner which is usually a protein, vegetable and carbohydrate.  Drinks 3 diet cokes a day because she does not like water.   Has not been checking her blood pressure at home or at work reguraly.  Estimates she takes it once a week and think the last two readings were around 130/87 and 117.73.  Pulse has been running lower though.  Has been in  60s to low 60s.   Currently takes metoprolol 50 mg BID for tachycardia when she was hyperthyroid.  Last TSH was WNL and has endocrinology appointment next week.  Current HTN meds: furosemide 60mg  once a day, losartan 100mg  daily, metoprolol tartrate 50 mg BID, isosorbide 60mg  daily  Previously tried: amlodipine 2.5mg , losartan 25 and 50mg   BP goal: <130/80  Social History: no tobacco, very rarely alcohol   Wt Readings from Last 3 Encounters:  01/11/21 176 lb (79.8 kg)  01/06/21 175 lb 9.6 oz (79.7 kg)  12/15/20 175 lb 3.2 oz (79.5 kg)   BP Readings from Last 3 Encounters:  01/11/21 126/67  01/06/21 128/74  12/15/20 132/80   Pulse Readings from Last 3 Encounters:  01/11/21 (!) 58  01/06/21 (!) 57  12/15/20 87    Renal function: CrCl cannot be calculated (Patient's most recent lab result is older than the maximum 21 days allowed.).  Past Medical History:  Diagnosis Date  . ACUT MI ANTEROLAT WALL SUBSQT EPIS CARE   . Acute maxillary sinusitis   . ALLERGIC RHINITIS, SEASONAL   . ARTHRITIS, RHEUMATOID    shoulders and hands Enbrel>leg swelling Dr. Estanislado Pandy  . Atrial fibrillation ([place])    a. after CABG.  . Back pain   .  Bruxism (teeth grinding)   . CAD, ARTERY BYPASS GRAFT    a. DES to RCA in 2010 then LAD occlusion s/p CABG 3 06/07/2009 with LIMA to LAD, reverse SVG to D1, reverse SVG to distal RCA. b. Cath 05/08/2016 slightly hypodense region in the intermediate branch, however she had excellent flow, FFR was normal. Vein graft to PDA and the posterolateral branch is patent, patent LIMA to LAD, occluded SVG to diagonal.  . CARPAL TUNNEL SYNDROME, BILATERAL   . CHOLELITHIASIS   . Contrast media allergy   . DERMATITIS, ALLERGIC   . DIABETES MELLITUS, TYPE I    on insulin pump dx'ed age 76 y.o   . DIABETIC  RETINOPATHY   . Hiatal hernia   . HYPERLIPIDEMIA-MIXED   . HYPERTHYROIDISM    Dr. Dollene Cleveland  . Insulin pump in place   . MIGRAINE W/O AURA W/INTRACT W/STATUS MIGRAINOSUS 02/19/2008   . Psoriasis   . SINUS TACHYCARDIA 11/08/2010  . SVT (supraventricular tachycardia) (HCC)    after s/p CABG  . TRIGGER FINGER    all fingers b/l hands   . URI     Current Outpatient Medications on File Prior to Visit  Medication Sig Dispense Refill  . aspirin EC 81 MG tablet Take 81 mg by mouth daily.    . CHELATED MAGNESIUM PO Take 250 mg by mouth daily.    . clopidogrel (PLAVIX) 75 MG tablet TAKE 1 TABLET BY MOUTH ONCE DAILY 90 tablet 3  . cyclobenzaprine (FLEXERIL) 10 MG tablet Take 1 tablet (10 mg total) by mouth at bedtime. 30 tablet 2  . dicyclomine (BENTYL) 20 MG tablet Take 1 tablet (20 mg total) by mouth 3 (three) times daily as needed for spasms (abdominal pain). 270 tablet 0  . EPINEPHrine 0.3 mg/0.3 mL IJ SOAJ injection Inject 0.3 mLs (0.3 mg total) into the muscle as needed for anaphylaxis. 1 Device prn  . folic acid (FOLVITE) 1 MG tablet TAKE 2 TABLETS BY MOUTH DAILY. 180 tablet 1  . furosemide (LASIX) 40 MG tablet Take 1.5 tablets (60 mg total) by mouth daily as needed for edema. 90 tablet 3  . insulin lispro (HUMALOG) 100 UNIT/ML injection Inject 80-90 Units into the skin continuous. FOR USE IN INSULIN PUMP. TOTAL DAILY INSULIN DOSE = UP TO 90 UNITS.    Marland Kitchen isosorbide mononitrate (IMDUR) 60 MG 24 hr tablet TAKE 1 TABLET (60 MG TOTAL) BY MOUTH DAILY. 90 tablet 3  . Krill Oil 500 MG CAPS Take 500 mg by mouth daily.    Marland Kitchen leflunomide (ARAVA) 20 MG tablet TAKE 1 TABLET BY MOUTH DAILY 90 tablet 0  . linaclotide (LINZESS) 290 MCG CAPS capsule Take 1 capsule (290 mcg total) by mouth daily before breakfast. 90 capsule 3  . losartan (COZAAR) 100 MG tablet TAKE 1 TABLET (100 MG TOTAL) BY MOUTH DAILY. 90 tablet 3  . metoprolol tartrate (LOPRESSOR) 50 MG tablet TAKE 1 TABLET BY MOUTH TWICE DAILY 180 tablet 3  . omeprazole (PRILOSEC) 40 MG capsule     . ondansetron (ZOFRAN-ODT) 4 MG disintegrating tablet Take 4 mg by mouth every 8 (eight) hours as needed.    . ONE TOUCH ULTRA TEST test  strip   1  . pantoprazole (PROTONIX) 40 MG tablet TAKE 1 TABLET (40 MG TOTAL) BY MOUTH DAILY. 90 tablet 1  . potassium chloride (KLOR-CON) 10 MEQ tablet Take 1 tablet (10 mEq total) by mouth daily. 90 tablet 3  . predniSONE (DELTASONE) 5 MG tablet Take 2  tablets by mouth daily x4 days, 1.5 tablet by mouth daily x4 days, 1 tablet by mouth daily x4 days, 0.5 tablet by mouth x4 days. 20 tablet 0  . promethazine (PHENERGAN) 25 MG tablet Take 0.5-1 tablets (12.5-25 mg total) by mouth 2 (two) times daily as needed for nausea or vomiting. 60 tablet 2  . rosuvastatin (CRESTOR) 20 MG tablet Take 1 tablet (20 mg total) by mouth daily. 90 tablet 3  . Upadacitinib ER (RINVOQ) 15 MG TB24 TAKE 1 TABLET (15MG ) BY MOUTH DAILY 30 tablet 2   No current facility-administered medications on file prior to visit.    Allergies  Allergen Reactions  . Actemra [Tocilizumab]   . Prochlorperazine Rash    Neuro problems per pt  . Ramipril Swelling, Rash and Other (See Comments)  . Shellfish-Derived Products Swelling    Shrimp   . Atorvastatin Rash    Elevated LFT's  . Compazine  [Prochlorperazine Edisylate] Other (See Comments)    Neurological reaction  . Emgality [Galcanezumab-Gnlm] Hives and Swelling  . Etanercept Swelling and Rash  . Infliximab Rash  . Iohexol     Iv contrast dye -rash all over  . Orencia [Abatacept] Rash  . Prochlorperazine Edisylate     unknown  . Shellfish Allergy Swelling    Shrimp(Facial swelling)  . Tofacitinib Rash and Other (See Comments)    Severe abdominal pain   . [employer]]     [employer], memory issues, word finding issues  . Amiodarone Nausea Only  . Rituximab Rash    Causes a rash     Assessment/Plan:  1. Hypertension - Patient BP in room today 118/68 which is at goal of <130/80. Pulse low normal in room today but patient concerned since it has been low recently.   Patient concerned regarding her endocrine issues (Graves disease, T1DM) which she  believes are not controlled at the moment which is causing her to gain weight and have edema, although edema not noticeably present today.  Has endo appointment next week where thyroid labs will be run.  Since patient concerned regarding her pulse rate, will reduce metoprolol tartrate to 25mg  BID.  Recommended patient be more diligent with monitoring BP and pulse rate at home or work and continue to minimize salt intake.  Since BP at goal today even with discontinuation of amlodipine, no need to increase or add new medications.  Will check BMP to check kidney function since patient has been on high dose furosemide.  Consider HCTZ/chlorthalidone if BP increases. Will wait for results of endocrine appointment before considering spironolactone due to antiandrogenic properties.     Patient reports will call back if she wants f/u appt  Continue losartan 100mg  daily Continue isosorbide 60mg  daily Continue furosemide 60mg  daily Check BMP  Karren Cobble, PharmD, BCACP, CDCES, [place] [address] [place],  [address] Phone: [phone]; Fax: [phone] 02/15/2021 1:34 PM

## 2021-02-15 NOTE — Patient Instructions (Addendum)
It was nice meeting you today!  Your blood pressure goal is less than 130/80 and your pressure today was 118/68.    Since you are concerned regarding your pulse rate and your blood pressure is at goal, we can reduce your metoprolol to 25mg  twice daily  Continue your losartan 100mg  daily and your furosemide 60 mg daily for now  We will check your lab work to make sure the furosemide is not affecting your kidneys    Karren Cobble, PharmD, BCACP, [place], [place]. [address], [place], [place] [address] Phone: [phone]; Fax: [phone] 02/15/2021 11:37 AM

## 2021-02-17 ENCOUNTER — Telehealth: Payer: Self-pay | Admitting: Pharmacist

## 2021-02-17 DIAGNOSIS — G4733 Obstructive sleep apnea (adult) (pediatric): Secondary | ICD-10-CM | POA: Insufficient documentation

## 2021-02-17 DIAGNOSIS — E1042 Type 1 diabetes mellitus with diabetic polyneuropathy: Secondary | ICD-10-CM | POA: Insufficient documentation

## 2021-02-17 NOTE — Telephone Encounter (Signed)
Spoke with patient.  Relayed stable labs, will follow up after endocrinology appointment next week

## 2021-02-17 NOTE — Addendum Note (Signed)
Addended by: Larey Seat on: 02/17/2021 02:19 PM   Modules accepted: Orders

## 2021-02-17 NOTE — Procedures (Signed)
[facility] ([facility])  STUDY DATE: 02/09/21  DOB: [birth year]  MRN: [id number]  ORDERING CLINICIAN: Larey Seat, MD   REFERRING CLINICIAN: McLean-Scocuzza, Nino Glow, MD   CLINICAL INFORMATION/HISTORY: [name] isa right -handed caucasian female with a chronic sleep disorder, who has a past medical history of MI ANTEROLAT WALL SUBSQT EPIS CARE, Acute maxillary sinusitis, ALLERGIC RHINITIS, SEASONAL, ARTHRITIS, RHEUMATOID, Atrial fibrillation ([place]), SVT, Open heart surgery for BYPASS, triple vessel in 2010, DIABETES MELLITUS, TYPE I, Insulin pump, DIABETIC  RETINOPATHY, Hiatal hernia, HYPERLIPIDEMIA-MIXED, HYPERTHYROIDISM, MIGRAINE W/O AURA W/INTRACT W/STATUS MIGRAINOSUS (02/19/2008), Sleep related headaches.  Epworth sleepiness score: 10/24. BMI: 31.3 kg/m Neck Circumference: 15.5 "  FINDINGS:   Total Record Time (hours, min): 8 h 20 min Total Sleep Time (hours, min):  6 h 41 min  Percent REM (%):    34.49 %   Calculated pAHI (per hour): 31.6       REM pAHI: 67.0    NREM pAHI: 27.5 Supine AHI: N/A   Oxygen Saturation (%) Mean: 92  Minimum oxygen saturation (%):         81   O2 Saturation Range (%): 81-98  O2Saturation (minutes) <=88%: 1.1 min  Pulse Mean (bpm):    71  Pulse Range (45-100)   IMPRESSION:This HST confirmed the presence of  Severe OSA (obstructive sleep apnea) at AHI of 31.6/h. There is strong exacerbation during REM sleep but no significant hypoxia associated.    RECOMMENDATION: I recommend CPAP therapy for this degree and constellation of REM sleep dependent apnea. The autotitration device would be set to 5-17 cm water pressure, 2 cm EPR with heated humidification and mask of patient's choice.   INTERPRETING PHYSICIAN:  Larey Seat, MD  [facility] Sleep Board certified by The [employer] of Sleep Medicine and Fellow of the [employer] of Neurology. Medical Director of [employer].

## 2021-02-17 NOTE — Progress Notes (Signed)
IMPRESSION:This HST confirmed the presence of  Severe OSA (obstructive sleep apnea) at AHI of 31.6/h. There is strong exacerbation during REM sleep, but no significant hypoxia associated.    RECOMMENDATION: I recommend CPAP therapy for this degree and constellation of REM sleep dependent apnea. The autotitration device would be set to 5-17 cm water pressure, 2 cm EPR with heated humidification and mask of patient's choice.   INTERPRETING PHYSICIAN:  Larey Seat, MD

## 2021-02-22 ENCOUNTER — Telehealth: Payer: Self-pay | Admitting: Neurology

## 2021-02-22 DIAGNOSIS — E0500 Thyrotoxicosis with diffuse goiter without thyrotoxic crisis or storm: Secondary | ICD-10-CM | POA: Diagnosis not present

## 2021-02-22 DIAGNOSIS — E10319 Type 1 diabetes mellitus with unspecified diabetic retinopathy without macular edema: Secondary | ICD-10-CM | POA: Diagnosis not present

## 2021-02-22 NOTE — Telephone Encounter (Signed)
-----   Message from Larey Seat, MD sent at 02/17/2021  2:19 PM EST ----- IMPRESSION:This HST confirmed the presence of  Severe OSA (obstructive sleep apnea) at AHI of 31.6/h. There is strong exacerbation during REM sleep, but no significant hypoxia associated.    RECOMMENDATION: I recommend CPAP therapy for this degree and constellation of REM sleep dependent apnea. The autotitration device would be set to 5-17 cm water pressure, 2 cm EPR with heated humidification and mask of patient's choice.   INTERPRETING PHYSICIAN:  Larey Seat, MD

## 2021-02-22 NOTE — Telephone Encounter (Signed)
I called pt. I advised pt that Dr. Brett Fairy reviewed their sleep study results and found that pt has severe sleep apnea. Dr. Brett Fairy recommends that pt starts auto CPAP. I reviewed PAP compliance expectations with the pt. Pt is agreeable to starting a CPAP. I advised pt that an order will be sent to a DME, [facility] and [facility] will call the pt within about one week after they file with the pt's insurance. [facility]) will show the pt how to use the machine, fit for masks, and troubleshoot the CPAP if needed. A follow up appt was made for insurance purposes with Dr. Brett Fairy or NP within 31-90 days from being set up on the machine. Pt will call back to scheduled. A letter will be sent through my chart to inform the patient. Pt verbalized understanding of results. Pt had no questions at this time but was encouraged to call back if questions arise. I have sent the order to [facility]) and have received confirmation that they have received the order.

## 2021-03-01 ENCOUNTER — Encounter: Payer: 59 | Admitting: Neurology

## 2021-03-03 MED FILL — RINVOQ 15 MG TB24: 15 | 30 days supply | Qty: 30 | Fill #1

## 2021-03-13 ENCOUNTER — Other Ambulatory Visit: Payer: Self-pay | Admitting: Cardiovascular Disease

## 2021-03-14 ENCOUNTER — Other Ambulatory Visit: Payer: Self-pay | Admitting: Cardiovascular Disease

## 2021-03-17 ENCOUNTER — Encounter: Payer: Self-pay | Admitting: Internal Medicine

## 2021-03-17 ENCOUNTER — Other Ambulatory Visit: Payer: Self-pay

## 2021-03-17 ENCOUNTER — Ambulatory Visit: Payer: 59 | Admitting: Internal Medicine

## 2021-03-17 ENCOUNTER — Other Ambulatory Visit: Payer: Self-pay | Admitting: Internal Medicine

## 2021-03-17 VITALS — BP 140/80 | HR 87 | Temp 98.4°F | Ht 63.0 in | Wt 182.6 lb

## 2021-03-17 DIAGNOSIS — M549 Dorsalgia, unspecified: Secondary | ICD-10-CM

## 2021-03-17 DIAGNOSIS — G8929 Other chronic pain: Secondary | ICD-10-CM

## 2021-03-17 DIAGNOSIS — R937 Abnormal findings on diagnostic imaging of other parts of musculoskeletal system: Secondary | ICD-10-CM

## 2021-03-17 DIAGNOSIS — M5442 Lumbago with sciatica, left side: Secondary | ICD-10-CM

## 2021-03-17 DIAGNOSIS — M5416 Radiculopathy, lumbar region: Secondary | ICD-10-CM

## 2021-03-17 MED ORDER — CYCLOBENZAPRINE HCL 10 MG PO TABS: 10.0000 mg | ORAL_TABLET | Freq: Every day | ORAL | 2 refills | Status: DC

## 2021-03-17 MED ORDER — METHYLPREDNISOLONE ACETATE 40 MG/ML IJ SUSP
80.0000 mg | Freq: Once | INTRAMUSCULAR | Status: AC
  Administered 2021-03-17: 80 mg via INTRAMUSCULAR

## 2021-03-17 MED ORDER — TRAMADOL HCL 50 MG PO TABS: 50.0000 mg | ORAL_TABLET | Freq: Two times a day (BID) | ORAL | 0 refills | Status: DC | PRN

## 2021-03-17 NOTE — Progress Notes (Signed)
On going mid to low left side back pain for a few weeks. No known injuries or changes.  Patient was diagnosed with bulging disks a few years ago in the back.

## 2021-03-17 NOTE — Patient Instructions (Addendum)
Dr. Runell Gess. Vertell Limber   Herniated Disk  A herniated disk, also called a ruptured disk or slipped disk, occurs when a disk in the spine bulges out too far. Between the bones in the spine (vertebrae), there are oval disks that are made of a soft, spongy center filled with liquid that is surrounded by a tough outer ring. The disks connect the vertebrae, help the spine move, and keep the bones from rubbing against each other when you move. When you have a herniated disk, the spongy center of the disk bulges out or breaks through the outer ring. The spongy center can press on a nerve between the vertebrae and cause pain. This can occur anywhere in the back or neck area, but the lower back is most commonly affected. What are the causes? This condition may be caused by:  Age-related wear and tear.  Sudden injury, such as a strain or sprain. What increases the risk? The following factors may make you more likely to develop this condition:  Aging. This is the main risk factor for a herniated disk.  Being a man who is 37-62 years old.  Frequently doing activities that involve heavy lifting, bending, or twisting.  Not getting enough exercise.  Being overweight.  Using tobacco products. What are the signs or symptoms? Symptoms may vary depending on where your herniated disk is located.  A herniated disk in the lower back may cause: ? Sharp pain in part of the hips, buttocks, or legs. ? Sharp pain in the lower back, spreading down through the leg into the foot (sciatica).  A herniated disk in the neck may cause dizziness and vertigo. It may also cause pain or weakness in the neck, shoulder, or upper arm, forearm, or fingers. You may also have muscle weakness. You may have trouble:  Lifting your leg or arm.  Standing on your toes.  Squeezing tightly with one of your hands. Other symptoms may include:  Numbness or tingling in the affected areas of the hands, arms, feet, or  legs.  Inability to control when you urinate or when you have bowel movements. This is a rare but serious sign of a severe herniated disk in the lower back. How is this diagnosed? This condition may be diagnosed based on:  Your symptoms.  Your medical history.  A physical exam. The exam may include: ? A straight-leg test. For this test, you will lie on your back while your health care provider lifts your leg, keeping your knee straight. If you feel pain, you likely have a herniated disk. ? Neurologic tests. These include checking for numbness, reflexes, muscle strength, and problems with posture.  Imaging tests, such as: ? X-rays. ? MRI. ? CT scan. ? Electromyogram (EMG) to check the nerves that control muscles. How is this treated? Treatment for this condition may include:  A period of light, painless activity for a few days to several weeks. Complete bed rest is not recommended. ? If you have a herniated disk in your lower back, avoid sitting as it increases pressure on the disk.  Medicines. These may include: ? NSAIDs, such as ibuprofen, to help reduce pain and swelling. ? Muscle relaxants to prevent sudden tightening of the back muscles (back spasms). ? Prescription pain medicines, if you have severe pain.  Ice or heat therapy.  Steroid injections in the area of the herniated disk. These can help reduce pain and swelling.  Physical therapy to strengthen your back muscles. In many cases, symptoms go away  with treatment over a period of days or weeks. You will most likely be free of symptoms after 3-4 months. If other treatments do not help to relieve your symptoms, you may need surgery. Follow these instructions at home: Medicines  Take over-the-counter and prescription medicines only as told by your health care provider.  Ask your health care provider if the medicine prescribed to you: ? Requires you to avoid driving or using heavy machinery. ? Can cause constipation.  You may need to take these actions to prevent or treat constipation:  Drink enough fluid to keep your urine pale yellow.  Take over-the-counter or prescription medicines.  Eat foods that are high in fiber, such as beans, whole grains, and fresh fruits and vegetables.  Limit foods that are high in fat and processed sugars, such as fried or sweet foods. Managing pain, stiffness, and swelling  If directed, put ice on the painful area. Icing can help to relieve pain. To do this: ? Put ice in a plastic bag. ? Place a towel between your skin and the bag. ? Leave the ice on for 20 minutes, 2-3 times a day. ? Remove the ice if your skin turns bright red. This is very important. If you cannot feel pain, heat, or cold, you have a greater risk of damage to the area.  If directed, apply heat to the painful area as often as told by your health care provider. Heat can reduce the stiffness of your muscles. Use the heat source that your health care provider recommends, such as a moist heat pack or a heating pad. ? Place a towel between your skin and the heat source. ? Leave the heat on for 20-30 minutes. ? Remove the heat if your skin turns bright red. This is especially important if you are unable to feel pain, heat, or cold. You may have a greater risk of getting burned.      Activity  Avoid strict bed rest but only do activities that are painless.  Gradually return to your normal activities and exercise as told by your health care provider. Ask your health care provider what activities and exercises are safe for you.  Use good posture.  Avoid movements that cause pain.  Do not lift anything that is heavier than 10 lb (4.5 kg), or the limit that you are told, until your health care provider says that it is safe.  Do not sit or stand for long periods of time without changing positions.  Do not sit for long periods of time without getting up and moving around.  If physical therapy was  prescribed, do exercises as told by your health care provider.  Aim to strengthen muscles in your back and abdomen with exercises such as swimming or walking. General instructions  Do not use any products that contain nicotine or tobacco, such as cigarettes, e-cigarettes, and chewing tobacco. These products can delay healing. If you need help quitting, ask your health care provider.  Do not wear high-heeled shoes.  Do not sleep on your abdomen.  If you are overweight, work with your health care provider to lose weight safely.  Keep all follow-up visits. This is important. How is this prevented?  Maintain a healthy weight.  Maintain physical fitness. Do at least 150 minutes of moderate-intensity exercise each week, such as brisk walking or water aerobics.  When lifting objects: ? Keep your feet at least shoulder-width apart and tighten the muscles of your abdomen. ? Keep your spine neutral  as you bend your knees and hips. It is important to lift using the strength of your legs, not your back. Do not lock your knees straight out. ? Always ask for help to lift heavy or awkward objects. Contact a health care provider if you:  Have back pain or neck pain that does not get better after 6 weeks.  Have severe pain in your back, neck, legs, or arms.  Develop numbness, tingling, or weakness anywhere in your body. Get help right away if:  You cannot move your arms or legs.  You cannot control when you urinate or have bowel movements.  You feel dizzy or you faint.  You have shortness of breath. These symptoms may represent a serious problem that is an emergency. Do not wait to see if the symptoms will go away. Get medical help right away. Call your local emergency services (911 in the [place] Do not drive yourself to the hospital. Summary  A herniated disk, also called a ruptured disk or slipped disk, occurs when a disk in the spine bulges out too far (herniates).  This condition may  be caused by age-related wear and tear or a sudden injury.  Symptoms may vary depending on where your herniated disk is located.  Treatment may include rest, medicines, ice or heat therapy, steroid injections, and physical therapy.  If other treatments do not help to relieve your symptoms, you may need surgery. This information is not intended to replace advice given to you by your health care provider. Make sure you discuss any questions you have with your health care provider. Document Revised: 04/06/2020 Document Reviewed: 04/06/2020 Elsevier Patient Education  2021 [employer].

## 2021-03-20 ENCOUNTER — Other Ambulatory Visit: Payer: Self-pay | Admitting: Family

## 2021-03-20 ENCOUNTER — Other Ambulatory Visit: Payer: Self-pay | Admitting: Cardiovascular Disease

## 2021-03-20 ENCOUNTER — Telehealth: Payer: Self-pay

## 2021-03-20 DIAGNOSIS — R937 Abnormal findings on diagnostic imaging of other parts of musculoskeletal system: Secondary | ICD-10-CM

## 2021-03-20 DIAGNOSIS — G8929 Other chronic pain: Secondary | ICD-10-CM

## 2021-03-20 DIAGNOSIS — M549 Dorsalgia, unspecified: Secondary | ICD-10-CM

## 2021-03-20 DIAGNOSIS — M5442 Lumbago with sciatica, left side: Secondary | ICD-10-CM

## 2021-03-20 MED ORDER — CYCLOBENZAPRINE HCL 10 MG PO TABS
10.0000 mg | ORAL_TABLET | Freq: Three times a day (TID) | ORAL | 0 refills | Status: DC | PRN
Start: 2021-03-20 — End: 2021-03-20

## 2021-03-20 NOTE — Addendum Note (Signed)
Addended by: Burnard Hawthorne on: 03/20/2021 04:08 PM   Modules accepted: Orders

## 2021-03-20 NOTE — Telephone Encounter (Signed)
I sent in new rx Thanks sarah

## 2021-03-20 NOTE — Telephone Encounter (Signed)
Pt states that the traMADol (ULTRAM) 50 MG tablet is making her nauseated. She is taking promethazine (PHENERGAN) 25 MG tablet but it is not working. She is having to leave work right now. She is requesting a different medication.

## 2021-03-20 NOTE — Telephone Encounter (Signed)
[name], please call pt and let her know that Dr Aundra Dubin is out of the office today however she is back first thing in the morning regarding pain medication.    Please advise that she may trial taking flexeril 10mg  TID if she doesn't find too sedating. Please let her know as well that I reached out to referral coordinator to expedite scheduling MRI as ordered by Dr Aundra Dubin as well.    [name], how soon can we get MR lumbar and MR thoracic scheduled?

## 2021-03-20 NOTE — Telephone Encounter (Signed)
LM that new rx was sent.

## 2021-03-20 NOTE — Progress Notes (Signed)
Chief Complaint  Patient presents with  . Back Pain   F/u  1. Left mid and lower back pain worse x 3 weeks but injured years ago in 2019 pain 10/10 walking with object of 5 lbs triggers pain and she has to stop. She wants referral to NS. She has tens unit at home. Pain radiates down left leg/thigh with numbness and tingling. PT in the past since 2019 made pain worse and did not help  abnormal MRIs C, T and L spine  03/08/18 C spine MRI  FINDINGS: Alignment: Physiologic.  Vertebrae: No fracture, evidence of discitis, or bone lesion.  Cord: Normal signal and morphology.  Posterior Fossa, vertebral arteries, paraspinal tissues: Negative.  Disc levels:  Good disc height.  No noted facet degeneration.  No impingement.  IMPRESSION: Negative cervical MRI.   Electronically Signed   By: Monte Fantasia M.D.   On: 03/08/2018 20:45   03/08/18 T spine MRI   Disc levels:  T9-10 right paracentral disc protrusion that is small and noncompressive.  T11-12 tiny left paracentral disc protrusion.  The foramina are diffusely patent.  IMPRESSION: 1. Small, noncompressive disc protrusions at T9-10 and T11-12. 2. Dorsal epidural fat expansion without cord impingement.   Electronically Signed   By: Monte Fantasia M.D.   On: 03/08/2018 20:59   03/08/18 MRI L spine  Conus medullaris and cauda equina: Conus extends to the L1 level. Conus and cauda equina appear normal.  Paraspinal and other soft tissues: Lumbar subcutaneous reticulation, commonly seen and incidental. Tiny right renal cystic intensity, incidental.  Disc levels:  Good disc height and hydration. Borderline disc bulging at L4-5. No herniation, impingement, or facet spurring.  IMPRESSION: No impingement to explain right leg symptoms. No focal finding to explain back pain.   Electronically Signed   By: Monte Fantasia M.D.   On: 03/08/2018 20:43   Review of Systems  Constitutional: Negative  for weight loss.  HENT: Negative for hearing loss.   Eyes: Negative for blurred vision.  Cardiovascular: Negative for chest pain.  Musculoskeletal: Positive for back pain.  Skin: Negative for rash.   Past Medical History:  Diagnosis Date  . ACUT MI ANTEROLAT WALL SUBSQT EPIS CARE   . Acute maxillary sinusitis   . ALLERGIC RHINITIS, SEASONAL   . ARTHRITIS, RHEUMATOID    shoulders and hands Enbrel>leg swelling Dr. Estanislado Pandy  . Atrial fibrillation ([place])    a. after CABG.  . Back pain   . Bruxism (teeth grinding)   . CAD, ARTERY BYPASS GRAFT    a. DES to RCA in 2010 then LAD occlusion s/p CABG 3 06/07/2009 with LIMA to LAD, reverse SVG to D1, reverse SVG to distal RCA. b. Cath 05/08/2016 slightly hypodense region in the intermediate branch, however she had excellent flow, FFR was normal. Vein graft to PDA and the posterolateral branch is patent, patent LIMA to LAD, occluded SVG to diagonal.  . CARPAL TUNNEL SYNDROME, BILATERAL   . CHOLELITHIASIS   . Contrast media allergy   . DERMATITIS, ALLERGIC   . DIABETES MELLITUS, TYPE I    on insulin pump dx'ed age 50 y.o   . DIABETIC  RETINOPATHY   . Hiatal hernia   . HYPERLIPIDEMIA-MIXED   . HYPERTHYROIDISM    Dr. Dollene Cleveland  . Insulin pump in place   . MIGRAINE W/O AURA W/INTRACT W/STATUS MIGRAINOSUS 02/19/2008  . Psoriasis   . SINUS TACHYCARDIA 11/08/2010  . SVT (supraventricular tachycardia) ([facility])    after s/p CABG  .  TRIGGER FINGER    all fingers b/l hands   . URI    Past Surgical History:  Procedure Laterality Date  . ABDOMINAL HYSTERECTOMY     endometriomas b/l; total ? cervix removed; no h/o abnormal pap  . Caesarean section    . CARDIAC CATHETERIZATION N/A 05/08/2016   Procedure: Left Heart Cath and Cors/Grafts Angiography;  Surgeon: Burnell Blanks, MD;  Location: [facility];  Service: Cardiovascular;  Laterality: N/A;  . CARPAL TUNNEL RELEASE     b/l   . CATARACT EXTRACTION, BILATERAL    . CHOLECYSTECTOMY     . COLONOSCOPY WITH PROPOFOL N/A 03/25/2020   Procedure: COLONOSCOPY WITH PROPOFOL;  Surgeon: Jonathon Bellows, MD;  Location: [facility] ENDOSCOPY;  Service: Gastroenterology;  Laterality: N/A;  . CORONARY ARTERY BYPASS GRAFT    . ESOPHAGOGASTRODUODENOSCOPY (EGD) WITH PROPOFOL N/A 03/25/2020   Procedure: ESOPHAGOGASTRODUODENOSCOPY (EGD) WITH PROPOFOL;  Surgeon: Jonathon Bellows, MD;  Location: [facility] ENDOSCOPY;  Service: Gastroenterology;  Laterality: N/A;  . EYE SURGERY     laser x 2 for retinopathy   . LEFT HEART CATH AND CORS/GRAFTS ANGIOGRAPHY N/A 02/22/2020   Procedure: LEFT HEART CATH AND CORS/GRAFTS ANGIOGRAPHY;  Surgeon: Burnell Blanks, MD;  Location: [facility];  Service: Cardiovascular;  Laterality: N/A;  . TRIGGER FINGER RELEASE     b/l fingers all   . VITRECTOMY     b/l    Family History  Problem Relation Age of Onset  . Depression Mother   . Anxiety disorder Mother   . Colon polyps Father   . Diabetes Father        2  . Hypertension Father   . Parkinson's disease Father   . Diabetes Other   . Heart disease Other   . Hypertension Other   . Hyperlipidemia Other   . Depression Other   . Migraines Other   . Stroke Paternal Grandmother   . Heart attack Neg Hx   . Colon cancer Neg Hx   . Stomach cancer Neg Hx   . Esophageal cancer Neg Hx    Social History   Socioeconomic History  . Marital status: Divorced    Spouse name: Not on file  . Number of children: Not on file  . Years of education: Not on file  . Highest education level: Not on file  Occupational History  . Not on file  Tobacco Use  . Smoking status: Never Smoker  . Smokeless tobacco: Never Used  Vaping Use  . Vaping Use: Never used  Substance and Sexual Activity  . Alcohol use: Yes    Comment: rarely 1 every 6 months  . Drug use: No  . Sexual activity: Not Currently    Partners: Male    Birth control/protection: None  Other Topics Concern  . Not on file  Social History Narrative   Divorced.  Has 2 kids(fraternal twins) daughters age 25. Works at [employer], Never smoked, denies ETOH, no drugs. Drinks diet coke. No exercise.       DPR daughters [name] and Jhene Westmoreland twins          Social Determinants of Health   Financial Resource Strain: Not on file  Food Insecurity: Not on file  Transportation Needs: Not on file  Physical Activity: Not on file  Stress: Not on file  Social Connections: Not on file  Intimate Partner Violence: Not on file   Current Meds  Medication Sig  . aspirin EC 81 MG tablet  Take 81 mg by mouth daily.  . CHELATED MAGNESIUM PO Take 250 mg by mouth daily.  . clopidogrel (PLAVIX) 75 MG tablet TAKE 1 TABLET BY MOUTH ONCE DAILY  . EPINEPHrine 0.3 mg/0.3 mL IJ SOAJ injection Inject 0.3 mLs (0.3 mg total) into the muscle as needed for anaphylaxis.  . folic acid (FOLVITE) 1 MG tablet TAKE 2 TABLETS BY MOUTH DAILY.  . furosemide (LASIX) 40 MG tablet TAKE 1 TABLET (40 MG TOTAL) BY MOUTH DAILY AS NEEDED FOR EDEMA.  . insulin lispro (HUMALOG) 100 UNIT/ML injection Inject 80-90 Units into the skin continuous. FOR USE IN INSULIN PUMP. TOTAL DAILY INSULIN DOSE = UP TO 90 UNITS.  Marland Kitchen isosorbide mononitrate (IMDUR) 60 MG 24 hr tablet TAKE 1 TABLET (60 MG TOTAL) BY MOUTH DAILY.  Marland Kitchen Krill Oil 500 MG CAPS Take 500 mg by mouth daily.  Marland Kitchen leflunomide (ARAVA) 20 MG tablet TAKE 1 TABLET BY MOUTH DAILY  . linaclotide (LINZESS) 290 MCG CAPS capsule Take 1 capsule (290 mcg total) by mouth daily before breakfast.  . losartan (COZAAR) 100 MG tablet TAKE 1 TABLET (100 MG TOTAL) BY MOUTH DAILY.  . metoprolol tartrate (LOPRESSOR) 50 MG tablet Take 0.5 tablets (25 mg total) by mouth 2 (two) times daily.  Marland Kitchen omeprazole (PRILOSEC) 40 MG capsule   . ONE TOUCH ULTRA TEST test strip   . pantoprazole (PROTONIX) 40 MG tablet TAKE 1 TABLET (40 MG TOTAL) BY MOUTH DAILY.  Marland Kitchen potassium chloride (KLOR-CON) 10 MEQ tablet Take 1 tablet (10 mEq total) by mouth daily.  . rosuvastatin  (CRESTOR) 20 MG tablet Take 1 tablet (20 mg total) by mouth daily.  . traMADol (ULTRAM) 50 MG tablet Take 1 tablet (50 mg total) by mouth 2 (two) times daily as needed for up to 5 days.  Marland Kitchen Upadacitinib ER (RINVOQ) 15 MG TB24 TAKE 1 TABLET (15MG) BY MOUTH DAILY  . [DISCONTINUED] cyclobenzaprine (FLEXERIL) 10 MG tablet Take 1 tablet (10 mg total) by mouth at bedtime.   Allergies  Allergen Reactions  . Actemra [Tocilizumab]   . Prochlorperazine Rash    Neuro problems per pt  . Ramipril Swelling, Rash and Other (See Comments)  . Shellfish-Derived Products Swelling    Shrimp   . Atorvastatin Rash    Elevated LFT's  . Compazine  [Prochlorperazine Edisylate] Other (See Comments)    Neurological reaction  . Emgality [Galcanezumab-Gnlm] Hives and Swelling  . Etanercept Swelling and Rash  . Infliximab Rash  . Iohexol     Iv contrast dye -rash all over  . Orencia [Abatacept] Rash  . Prochlorperazine Edisylate     unknown  . Shellfish Allergy Swelling    Shrimp(Facial swelling)  . Tofacitinib Rash and Other (See Comments)    Severe abdominal pain   . [employer]]     [employer], memory issues, word finding issues  . Amiodarone Nausea Only  . Rituximab Rash    Causes a rash   Recent Results (from the past 2160 hour(s))  CBC with Differential/Platelet     Status: None   Collection Time: 01/06/21 12:00 AM  Result Value Ref Range   WBC 6.1 3.8 - 10.8 Thousand/uL   RBC 4.15 3.80 - 5.10 Million/uL   Hemoglobin 12.5 11.7 - 15.5 g/dL   HCT 36.2 35.0 - 45.0 %   MCV 87.2 80.0 - 100.0 fL   MCH 30.1 27.0 - 33.0 pg   MCHC 34.5 32.0 - 36.0 g/dL   RDW 12.6 11.0 -  15.0 %   Platelets 218 140 - 400 Thousand/uL   MPV 10.2 7.5 - 12.5 fL   Neutro Abs 3,264 1,500 - 7,800 cells/uL   Lymphs Abs 2,086 850 - 3,900 cells/uL   Absolute Monocytes 519 200 - 950 cells/uL   Eosinophils Absolute 183 15 - 500 cells/uL   Basophils Absolute 49 0 - 200 cells/uL   Neutrophils Relative % 53.5 %    Total Lymphocyte 34.2 %   Monocytes Relative 8.5 %   Eosinophils Relative 3.0 %   Basophils Relative 0.8 %  COMPLETE METABOLIC PANEL WITH GFR     Status: Abnormal   Collection Time: 01/06/21 12:00 AM  Result Value Ref Range   Glucose, Bld 135 (H) 65 - 99 mg/dL    Comment: .            Fasting reference interval . For someone without known diabetes, a glucose value >125 mg/dL indicates that they may have diabetes and this should be confirmed with a follow-up test. .    BUN 14 7 - 25 mg/dL   Creat 0.88 0.50 - 1.05 mg/dL    Comment: For patients >80 years of age, the reference limit for Creatinine is approximately 13% higher for people identified as African-American. .    GFR, Est Non African American 75 > OR = 60 mL/min/1.30m   GFR, Est African American 87 > OR = 60 mL/min/1.780m  BUN/Creatinine Ratio NOT APPLICABLE 6 - 22 (calc)   Sodium 141 135 - 146 mmol/L   Potassium 4.3 3.5 - 5.3 mmol/L   Chloride 104 98 - 110 mmol/L   CO2 29 20 - 32 mmol/L   Calcium 9.4 8.6 - 10.4 mg/dL   Total Protein 6.6 6.1 - 8.1 g/dL   Albumin 4.5 3.6 - 5.1 g/dL   Globulin 2.1 1.9 - 3.7 g/dL (calc)   AG Ratio 2.1 1.0 - 2.5 (calc)   Total Bilirubin 0.5 0.2 - 1.2 mg/dL   Alkaline phosphatase (APISO) 77 37 - 153 U/L   AST 24 10 - 35 U/L   ALT 21 6 - 29 U/L  ECHOCARDIOGRAM COMPLETE     Status: None   Collection Time: 01/26/21  2:28 PM  Result Value Ref Range   AR max vel 1.35 cm2   AV Peak grad 7.1 mmHg   Ao pk vel 1.33 m/s   S' Lateral 2.50 cm   Area-P 1/2 4.06 cm2   AV Area VTI 1.37 cm2   AV Mean grad 4.0 mmHg   AV Area mean vel 1.36 cm2  Basic Metabolic Panel (BMET)     Status: Abnormal   Collection Time: 02/15/21 11:41 AM  Result Value Ref Range   Glucose 105 (H) 65 - 99 mg/dL   BUN 15 6 - 24 mg/dL   Creatinine, Ser 0.90 0.57 - 1.00 mg/dL   GFR calc non Af Amer 73 >59 mL/min/1.73   GFR calc Af Amer 84 >59 mL/min/1.73    Comment: **In accordance with recommendations from the NKF-ASN  Task force,**   [facility] is in the process of updating its eGFR calculation to the   2021 CKD-EPI creatinine equation that estimates kidney function   without a race variable.    BUN/Creatinine Ratio 17 9 - 23   Sodium 139 134 - 144 mmol/L   Potassium 4.0 3.5 - 5.2 mmol/L   Chloride 100 96 - 106 mmol/L   CO2 22 20 - 29 mmol/L   Calcium 9.1 8.7 - 10.2  mg/dL   Objective  Body mass index is 32.35 kg/m. Wt Readings from Last 3 Encounters:  03/17/21 182 lb 9.6 oz (82.8 kg)  01/11/21 176 lb (79.8 kg)  01/06/21 175 lb 9.6 oz (79.7 kg)   Temp Readings from Last 3 Encounters:  03/17/21 98.4 F (36.9 C) (Oral)  12/15/20 98.1 F (36.7 C) (Oral)  09/14/20 98.1 F (36.7 C) (Oral)   BP Readings from Last 3 Encounters:  03/17/21 140/80  02/15/21 118/68  01/11/21 126/67   Pulse Readings from Last 3 Encounters:  03/17/21 87  02/15/21 63  01/11/21 (!) 58    Physical Exam Vitals and nursing note reviewed.  Constitutional:      Appearance: Normal appearance. She is well-developed and well-groomed. She is obese.  HENT:     Head: Normocephalic and atraumatic.  Eyes:     Conjunctiva/sclera: Conjunctivae normal.     Pupils: Pupils are equal, round, and reactive to light.  Cardiovascular:     Rate and Rhythm: Normal rate and regular rhythm.     Heart sounds: Normal heart sounds. No murmur heard.   Pulmonary:     Effort: Pulmonary effort is normal.     Breath sounds: Normal breath sounds.  Musculoskeletal:     Thoracic back: Tenderness present.     Lumbar back: Tenderness present. Positive right straight leg raise test and positive left straight leg raise test.  Skin:    General: Skin is warm and dry.  Neurological:     General: No focal deficit present.     Mental Status: She is alert and oriented to person, place, and time. Mental status is at baseline.     Gait: Gait normal.  Psychiatric:        Attention and Perception: Attention and perception normal.        Mood and  Affect: Mood normal.        Speech: Speech normal.        Behavior: Behavior normal. Behavior is cooperative.        Thought Content: Thought content normal.        Cognition and Memory: Cognition and memory normal.        Judgment: Judgment normal.     Assessment  Plan  Chronic midline low back pain with left-sided sciatica - Plan: Ambulatory referral to Neurosurgery, traMADol (ULTRAM) 50 MG tablet, cyclobenzaprine (FLEXERIL) 10 MG tablet, methylPREDNISolone acetate (DEPO-MEDROL) injection 80 mg, MR Thoracic Spine Wo Contrast, MR Lumbar Spine Wo Contrast  Chronic mid back pain - Plan: Ambulatory referral to Neurosurgery, traMADol (ULTRAM) 50 MG tablet, cyclobenzaprine (FLEXERIL) 10 MG tablet, methylPREDNISolone acetate (DEPO-MEDROL) injection 80 mg, MR Thoracic Spine Wo Contrast, MR Lumbar Spine Wo Contrast  Abnormal MRI, lumbar spine - Plan: Ambulatory referral to Neurosurgery, traMADol (ULTRAM) 50 MG tablet, cyclobenzaprine (FLEXERIL) 10 MG tablet, methylPREDNISolone acetate (DEPO-MEDROL) injection 80 mg, MR Thoracic Spine Wo Contrast, MR Lumbar Spine Wo Contrast  Abnormal MRI, thoracic spine - Plan: Ambulatory referral to Neurosurgery, traMADol (ULTRAM) 50 MG tablet, cyclobenzaprine (FLEXERIL) 10 MG tablet, methylPREDNISolone acetate (DEPO-MEDROL) injection 80 mg, MR Thoracic Spine Wo Contrast, MR Lumbar Spine Wo Contrast  Lumbar radiculopathy - Plan: MR Thoracic Spine Wo Contrast, MR Lumbar Spine Wo Contrast  HM  Flu shotutd 08/2020 prevnar utd  pna 23utd Tdap utd 06/25/18 covid vx had 2/2disc booster in 6 months due to immunocompromised sch 01/14/21 shingrix 2/2 given today   mammo 11/11/19 negativeordered sch 01/11/21 DEXA 11/11/19 normal  Colonoscopy 08/30/17 normal Dr. Memory Dance  colitis, 2021 establishedDr. Vicente Males colonoscopy EGD sch 03/25/20 Papno longer neededS/p hysterectomy no h/o abnormal  Endometriosis total hysterectomy   SPEP abnormal consider nephrotic  proteinuria consider renal in future 10/15/2019 no abnormal bands but low urine creatinine and protein  [facility] Dr. Steffanie Dunn appt 2/19/21due to f/u 07/2020, 01/2021  Cards Dr. Rush Landmark 03/10/20 GNA-migraines  Dermatology Dr. Laurence Ferrari saw 12/2019 Rheumatology Dr. Estanislado Pandy  Hand surgery Dr. Fredna Dow Vascular Dr. Lucky Cowboy  Retinal specialist Dr. Tempie Hoist  GI: Dr. Jonathon Bellows     Provider: Dr. Olivia Mackie [facility]

## 2021-03-20 NOTE — Telephone Encounter (Signed)
Dr. Olivia Mackie did send in, but was too soon to refill bc she wrote it as one per night at bedtime & not more than that. She checked Saturday & could not be filled for 5 more days. She asked could you change the way script is written & send again?

## 2021-03-20 NOTE — Telephone Encounter (Signed)
Please advise 

## 2021-03-21 ENCOUNTER — Telehealth: Payer: Self-pay | Admitting: Internal Medicine

## 2021-03-21 NOTE — Telephone Encounter (Signed)
FYI

## 2021-03-21 NOTE — Telephone Encounter (Signed)
Good morning!  Pt has been scheduled for both MRI's.

## 2021-03-21 NOTE — Telephone Encounter (Signed)
lft vm with appt information. Thanks

## 2021-03-22 ENCOUNTER — Other Ambulatory Visit: Payer: Self-pay | Admitting: Internal Medicine

## 2021-03-22 DIAGNOSIS — R110 Nausea: Secondary | ICD-10-CM

## 2021-03-22 MED ORDER — PROMETHAZINE HCL 25 MG PO TABS: 12.5000 mg | ORAL_TABLET | Freq: Two times a day (BID) | ORAL | 2 refills | Status: DC | PRN

## 2021-03-22 MED ORDER — ONDANSETRON HCL 4 MG PO TABS: 4.0000 mg | ORAL_TABLET | Freq: Three times a day (TID) | ORAL | 0 refills | Status: DC | PRN

## 2021-03-22 NOTE — Telephone Encounter (Signed)
She can alternate phenerghan with zofran for nausea with tramadol

## 2021-03-23 NOTE — Telephone Encounter (Signed)
What does she want for pain then anything else shes tolerated for pain in the past percocet, oxycodone?

## 2021-03-23 NOTE — Telephone Encounter (Signed)
Patient states she can not do Oxy she states she is willing to try Percocet but is unsure how this will do for her.

## 2021-03-23 NOTE — Telephone Encounter (Signed)
Patient states that she has tried this and it did not help. Patient states that she has stopped taking the tramadol.

## 2021-03-27 ENCOUNTER — Other Ambulatory Visit: Payer: Self-pay | Admitting: Internal Medicine

## 2021-03-27 MED ORDER — OXYCODONE-ACETAMINOPHEN 5-325 MG PO TABS
1.0000 | ORAL_TABLET | Freq: Two times a day (BID) | ORAL | 0 refills | Status: DC | PRN
Start: 2021-03-27 — End: 2021-03-27

## 2021-03-27 MED ORDER — OXYCODONE HCL 5 MG PO TABS
5.0000 mg | ORAL_TABLET | Freq: Two times a day (BID) | ORAL | 0 refills | Status: DC | PRN
Start: 2021-03-27 — End: 2021-03-27

## 2021-03-27 NOTE — Telephone Encounter (Signed)
Call pharmacy please and cancel oxycodone and tramadol she cant take these but wants to try percocet for pain sent this in   She can take nausea medication 30 min before pain medication but percocet had oxycodone in it too  Ultimately she will need to f/u with specialty for her chronic back pain issues   Thank you

## 2021-03-27 NOTE — Addendum Note (Signed)
Addended by: Orland Mustard on: 03/27/2021 10:13 AM   Modules accepted: Orders

## 2021-03-27 NOTE — Telephone Encounter (Signed)
Patient informed and verbalized understanding.   Called and canceled medications with the pharmacy [facility]

## 2021-03-27 NOTE — Addendum Note (Signed)
Addended by: Orland Mustard on: 03/27/2021 10:10 AM   Modules accepted: Orders

## 2021-03-28 ENCOUNTER — Other Ambulatory Visit (HOSPITAL_COMMUNITY): Payer: Self-pay

## 2021-03-29 DIAGNOSIS — G5622 Lesion of ulnar nerve, left upper limb: Secondary | ICD-10-CM | POA: Diagnosis not present

## 2021-03-29 DIAGNOSIS — G5602 Carpal tunnel syndrome, left upper limb: Secondary | ICD-10-CM | POA: Diagnosis not present

## 2021-03-29 MED FILL — RINVOQ 15 MG TB24: 15 | 30 days supply | Qty: 30 | Fill #2

## 2021-04-02 ENCOUNTER — Ambulatory Visit
Admission: RE | Admit: 2021-04-02 | Discharge: 2021-04-02 | Disposition: A | Discharge status: Home / Self Care | Payer: 59 | Source: Ambulatory Visit | Attending: Internal Medicine | Admitting: Internal Medicine

## 2021-04-02 ENCOUNTER — Other Ambulatory Visit: Payer: Self-pay

## 2021-04-02 DIAGNOSIS — M5442 Lumbago with sciatica, left side: Secondary | ICD-10-CM

## 2021-04-02 DIAGNOSIS — G8929 Other chronic pain: Secondary | ICD-10-CM | POA: Diagnosis not present

## 2021-04-02 DIAGNOSIS — M5416 Radiculopathy, lumbar region: Secondary | ICD-10-CM | POA: Diagnosis not present

## 2021-04-02 DIAGNOSIS — M4804 Spinal stenosis, thoracic region: Secondary | ICD-10-CM | POA: Diagnosis not present

## 2021-04-02 DIAGNOSIS — R937 Abnormal findings on diagnostic imaging of other parts of musculoskeletal system: Secondary | ICD-10-CM | POA: Diagnosis not present

## 2021-04-02 DIAGNOSIS — M545 Low back pain, unspecified: Secondary | ICD-10-CM | POA: Diagnosis not present

## 2021-04-02 DIAGNOSIS — M549 Dorsalgia, unspecified: Secondary | ICD-10-CM | POA: Diagnosis not present

## 2021-04-03 ENCOUNTER — Encounter: Payer: Self-pay | Admitting: Internal Medicine

## 2021-04-03 ENCOUNTER — Other Ambulatory Visit: Payer: 59

## 2021-04-03 ENCOUNTER — Ambulatory Visit: Payer: 59

## 2021-04-06 ENCOUNTER — Other Ambulatory Visit: Payer: Self-pay | Admitting: Orthopedic Surgery

## 2021-04-06 DIAGNOSIS — M5134 Other intervertebral disc degeneration, thoracic region: Secondary | ICD-10-CM | POA: Diagnosis not present

## 2021-04-06 DIAGNOSIS — M5136 Other intervertebral disc degeneration, lumbar region: Secondary | ICD-10-CM | POA: Diagnosis not present

## 2021-04-07 ENCOUNTER — Ambulatory Visit (INDEPENDENT_AMBULATORY_CARE_PROVIDER_SITE_OTHER): Payer: 59 | Admitting: Physician Assistant

## 2021-04-07 ENCOUNTER — Telehealth: Payer: Self-pay | Admitting: *Deleted

## 2021-04-07 ENCOUNTER — Encounter: Payer: Self-pay | Admitting: Physician Assistant

## 2021-04-07 ENCOUNTER — Other Ambulatory Visit: Payer: Self-pay

## 2021-04-07 ENCOUNTER — Other Ambulatory Visit: Payer: Self-pay | Admitting: Cardiovascular Disease

## 2021-04-07 VITALS — BP 134/76 | HR 66 | Resp 14 | Ht 63.0 in | Wt 183.2 lb

## 2021-04-07 DIAGNOSIS — Z8679 Personal history of other diseases of the circulatory system: Secondary | ICD-10-CM

## 2021-04-07 DIAGNOSIS — M5134 Other intervertebral disc degeneration, thoracic region: Secondary | ICD-10-CM | POA: Diagnosis not present

## 2021-04-07 DIAGNOSIS — G4709 Other insomnia: Secondary | ICD-10-CM

## 2021-04-07 DIAGNOSIS — G5602 Carpal tunnel syndrome, left upper limb: Secondary | ICD-10-CM

## 2021-04-07 DIAGNOSIS — M24521 Contracture, right elbow: Secondary | ICD-10-CM

## 2021-04-07 DIAGNOSIS — M7062 Trochanteric bursitis, left hip: Secondary | ICD-10-CM

## 2021-04-07 DIAGNOSIS — L409 Psoriasis, unspecified: Secondary | ICD-10-CM | POA: Diagnosis not present

## 2021-04-07 DIAGNOSIS — Z8639 Personal history of other endocrine, nutritional and metabolic disease: Secondary | ICD-10-CM | POA: Diagnosis not present

## 2021-04-07 DIAGNOSIS — Z79899 Other long term (current) drug therapy: Secondary | ICD-10-CM | POA: Diagnosis not present

## 2021-04-07 DIAGNOSIS — R5383 Other fatigue: Secondary | ICD-10-CM

## 2021-04-07 DIAGNOSIS — Z8669 Personal history of other diseases of the nervous system and sense organs: Secondary | ICD-10-CM

## 2021-04-07 DIAGNOSIS — M0609 Rheumatoid arthritis without rheumatoid factor, multiple sites: Secondary | ICD-10-CM

## 2021-04-07 DIAGNOSIS — M8589 Other specified disorders of bone density and structure, multiple sites: Secondary | ICD-10-CM

## 2021-04-07 DIAGNOSIS — R778 Other specified abnormalities of plasma proteins: Secondary | ICD-10-CM

## 2021-04-07 DIAGNOSIS — M5136 Other intervertebral disc degeneration, lumbar region: Secondary | ICD-10-CM

## 2021-04-07 DIAGNOSIS — M24522 Contracture, left elbow: Secondary | ICD-10-CM

## 2021-04-07 DIAGNOSIS — Z8719 Personal history of other diseases of the digestive system: Secondary | ICD-10-CM

## 2021-04-07 MED ORDER — TRIAMCINOLONE ACETONIDE 40 MG/ML IJ SUSP
40.0000 mg | INTRAMUSCULAR | Status: AC | PRN
  Administered 2021-04-07: 40 mg via INTRA_ARTICULAR

## 2021-04-07 MED ORDER — LIDOCAINE HCL 1 % IJ SOLN
1.5000 mL | INTRAMUSCULAR | Status: AC | PRN
  Administered 2021-04-07: 1.5 mL

## 2021-04-07 MED ORDER — ROSUVASTATIN CALCIUM 20 MG PO TABS
20.0000 mg | ORAL_TABLET | Freq: Every day | ORAL | 0 refills | Status: DC
  Filled 2021-04-07: qty 90, 90d supply, fill #0

## 2021-04-07 NOTE — Telephone Encounter (Signed)
Yes she's at low risk of holding ASA and plavix for surgery as requested. thanks

## 2021-04-07 NOTE — Progress Notes (Signed)
Office Visit Note  Patient: [name]             Date of Birth: Jul 16, 1966           MRN: [id number]             PCP: McLean-Scocuzza, Nino Glow, MD Referring: Orland Mustard * Visit Date: 04/07/2021 Occupation: @GUAROCC @  Subjective:  Left trochanteric bursitis   History of Present Illness: [name] is a 55 y.o. female with history of seronegative rheumatoid arthritis and DDD.  She is currently taking rinvoq 15 mg 1 tablet by mouth daily and arava 20 mg 1 tablet by mouth daily.   She hs not missed any doses recently.  She presents today with discomfort due to left trochanteric bursitis.  She states that her discomfort started about 3 weeks ago and has progressively been worsening.  She has been experiencing nocturnal pain as well as difficulty walking for prolonged distances.  She states that at times the pain radiates down her left IT band.  Patient reports that she has also been experiencing increased lower back pain.  She states that her overall discomfort has been more difficult to manage since having to discontinue Relafen and Plaquenil in the past.  She is not experiencing any joint swelling at this time.  She continues to have numbness in the left hand and is scheduled for left carpal tunnel release and left ulnar nerve release with Dr. Fredna Dow on 06/06/2021.  Going to the patient she had a cortisone injection which alleviated the pain but the numbness has remained. She denies any recent infections.        Activities of Daily Living:  Patient reports morning stiffness for 1-1.5 hours.   Patient Reports nocturnal pain.  Difficulty dressing/grooming: Denies Difficulty climbing stairs: Reports Difficulty getting out of chair: Denies Difficulty using hands for taps, buttons, cutlery, and/or writing: Reports  Review of Systems  Constitutional: Positive for fatigue.  HENT: Negative for mouth sores, mouth dryness and nose dryness.   Eyes: Negative for pain, itching and  dryness.  Respiratory: Negative for shortness of breath and difficulty breathing.   Cardiovascular: Negative for chest pain and palpitations.  Gastrointestinal: Positive for constipation. Negative for blood in stool and diarrhea.  Endocrine: Negative for increased urination.  Genitourinary: Negative for difficulty urinating.  Musculoskeletal: Positive for arthralgias, joint pain, joint swelling, myalgias, morning stiffness, muscle tenderness and myalgias.  Skin: Negative for color change, rash and redness.  Allergic/Immunologic: Negative for susceptible to infections.  Neurological: Positive for numbness and weakness. Negative for dizziness, headaches and memory loss.  Hematological: Negative for bruising/bleeding tendency.  Psychiatric/Behavioral: Negative for confusion.    PMFS History:  Patient Active Problem List   Diagnosis Date Noted  . Abnormal MRI, lumbar spine 03/20/2021  . Abnormal MRI, thoracic spine 03/20/2021  . Chronic mid back pain 03/17/2021  . Chronic midline low back pain with left-sided sciatica 03/17/2021  . Type 1 diabetes mellitus with diabetic polyneuropathy ([place]) 02/17/2021  . Severe obstructive sleep apnea-hypopnea syndrome 02/17/2021  . Chronic diastolic CHF (congestive heart failure) ([place]) 12/19/2020  . Chronic back pain 12/19/2020  . Chronic abdominal pain 12/19/2020  . Acute diastolic heart failure ([place]) 12/09/2020  . Diabetic retinopathy associated with type 1 diabetes mellitus ([place]) 09/27/2020  . Diabetic retinopathy ([place]) 09/27/2020  . Graves' disease 09/27/2020  . Chronic coronary artery disease 09/27/2020  . Hypertension associated with diabetes ([place]) 09/14/2020  . Obesity (BMI 30-39.9) 09/14/2020  . Annual physical  exam 09/14/2020  . Aortic atherosclerosis ([place]) 09/14/2020  . Snoring 05/12/2020  . Lung nodule 03/10/2020  . Colitis 03/10/2020  . Type 1 diabetes mellitus with proliferative retinopathy ([place]) 12/10/2019  . Abnormal thyroid  function test 12/10/2019  . Bruxism (teeth grinding)   . Chronic migraine 10/07/2019  . Contusion of left hip 07/25/2019  . H/O multiple allergies 03/10/2019  . Hx of anaphylaxis 03/10/2019  . Cough 06/26/2018  . PND (post-nasal drip) 06/26/2018  . Lumbar strain, initial encounter 04/10/2018  . Thoracic myofascial strain 04/10/2018  . Vitamin D deficiency 07/04/2017  . B12 deficiency 07/04/2017  . Abnormal CT scan 06/19/2017  . Gastroesophageal reflux disease 06/19/2017  . Antiplatelet or antithrombotic long-term use 06/19/2017  . Nasal congestion 03/15/2017  . Graves disease 02/01/2017  . Sleep difficulties 02/01/2017  . No energy 02/01/2017  . Osteopenia 02/01/2017  . Bilateral hand pain 07/16/2016  . Acquired trigger finger 07/16/2016  . Hypertension, essential 05/08/2016  . Foot pain, right 08/30/2014  . Shoulder pain, right 08/30/2014  . Chronic pain syndrome 01/15/2014  . Multiple joint pain 01/15/2014  . Lesion of lower eyelid 04/21/2013  . Generalized constriction of visual field 03/31/2013  . Neoplasm of uncertain behavior of skin of eyelid 03/31/2013  . Posterior capsular opacification 03/31/2013  . Cyst of left lower eyelid 03/30/2013  . Pseudophakia of both eyes 03/30/2013  . Prurigo nodularis 02/05/2013  . Stasis dermatitis 02/05/2013  . Psoriasis 01/21/2013  . Trochanteric bursitis 11/10/2012  . Achilles tendinitis 07/11/2012  . Epiretinal membrane 06/17/2012  . Status post cataract extraction 04/30/2012  . Pes equinus, acquired 04/23/2012  . Tendinitis of ankle 04/23/2012  . Proliferative diabetic retinopathy of both eyes ([place]) 01/11/2012  . Ongoing use of possibly toxic medication 12/05/2011  . Hyperthyroidism 11/09/2010  . SINUS TACHYCARDIA 11/08/2010  . DERMATITIS, ALLERGIC 07/20/2010  . EDEMA 07/12/2010  . DIZZINESS 11/14/2009  . ATRIAL FIBRILLATION 07/20/2009  . Hx of CABG 06/07/2009  . ANGINA, STABLE/EXERTIONAL 06/02/2009  . Dyslipidemia, goal  LDL below 70 04/26/2009  . ACUT MI ANTEROLAT WALL SUBSQT EPIS CARE 04/26/2009  . Chest pain 04/26/2009  . DIABETIC  RETINOPATHY 04/25/2009  . CARPAL TUNNEL SYNDROME, BILATERAL 04/25/2009  . TRIGGER FINGER 04/25/2009  . MIGRAINE W/O AURA W/INTRACT W/STATUS MIGRAINOSUS 02/19/2008  . ALLERGIC RHINITIS, SEASONAL 10/16/2006  . CHOLELITHIASIS 10/16/2006  . Rheumatoid arthritis ([place]) 10/16/2006    Past Medical History:  Diagnosis Date  . ACUT MI ANTEROLAT WALL SUBSQT EPIS CARE   . Acute maxillary sinusitis   . ALLERGIC RHINITIS, SEASONAL   . ARTHRITIS, RHEUMATOID    shoulders and hands Enbrel>leg swelling Dr. Estanislado Pandy  . Atrial fibrillation ([place])    a. after CABG.  . Back pain   . Bruxism (teeth grinding)   . CAD, ARTERY BYPASS GRAFT    a. DES to RCA in 2010 then LAD occlusion s/p CABG 3 06/07/2009 with LIMA to LAD, reverse SVG to D1, reverse SVG to distal RCA. b. Cath 05/08/2016 slightly hypodense region in the intermediate branch, however she had excellent flow, FFR was normal. Vein graft to PDA and the posterolateral branch is patent, patent LIMA to LAD, occluded SVG to diagonal.  . CARPAL TUNNEL SYNDROME, BILATERAL   . CHOLELITHIASIS   . Contrast media allergy   . DERMATITIS, ALLERGIC   . DIABETES MELLITUS, TYPE I    on insulin pump dx'ed age 42 y.o   . DIABETIC  RETINOPATHY   . Hiatal hernia   . HYPERLIPIDEMIA-MIXED   .  HYPERTHYROIDISM    Dr. Dollene Cleveland  . Insulin pump in place   . MIGRAINE W/O AURA W/INTRACT W/STATUS MIGRAINOSUS 02/19/2008  . Psoriasis   . SINUS TACHYCARDIA 11/08/2010  . SVT (supraventricular tachycardia) (HCC)    after s/p CABG  . TRIGGER FINGER    all fingers b/l hands   . URI     Family History  Problem Relation Age of Onset  . Depression Mother   . Anxiety disorder Mother   . Colon polyps Father   . Diabetes Father        2  . Hypertension Father   . Parkinson's disease Father   . Diabetes Other   . Heart disease Other   . Hypertension Other    . Hyperlipidemia Other   . Depression Other   . Migraines Other   . Stroke Paternal Grandmother   . Heart attack Neg Hx   . Colon cancer Neg Hx   . Stomach cancer Neg Hx   . Esophageal cancer Neg Hx    Past Surgical History:  Procedure Laterality Date  . ABDOMINAL HYSTERECTOMY     endometriomas b/l; total ? cervix removed; no h/o abnormal pap  . Caesarean section    . CARDIAC CATHETERIZATION N/A 05/08/2016   Procedure: Left Heart Cath and Cors/Grafts Angiography;  Surgeon: Burnell Blanks, MD;  Location: [facility];  Service: Cardiovascular;  Laterality: N/A;  . CARPAL TUNNEL RELEASE     b/l   . CATARACT EXTRACTION, BILATERAL    . CHOLECYSTECTOMY    . COLONOSCOPY WITH PROPOFOL N/A 03/25/2020   Procedure: COLONOSCOPY WITH PROPOFOL;  Surgeon: Jonathon Bellows, MD;  Location[facility] ENDOSCOPY;  Service: Gastroenterology;  Laterality: N/A;  . CORONARY ARTERY BYPASS GRAFT    . ESOPHAGOGASTRODUODENOSCOPY (EGD) WITH PROPOFOL N/A 03/25/2020   Procedure: ESOPHAGOGASTRODUODENOSCOPY (EGD) WITH PROPOFOL;  Surgeon: Jonathon Bellows, MD;  Location: [facility] ENDOSCOPY;  Service: Gastroenterology;  Laterality: N/A;  . EYE SURGERY     laser x 2 for retinopathy   . LEFT HEART CATH AND CORS/GRAFTS ANGIOGRAPHY N/A 02/22/2020   Procedure: LEFT HEART CATH AND CORS/GRAFTS ANGIOGRAPHY;  Surgeon: Burnell Blanks, MD;  Location: [place] [facility];  Service: Cardiovascular;  Laterality: N/A;  . TRIGGER FINGER RELEASE     b/l fingers all   . VITRECTOMY     b/l    Social History   Social History Narrative   Divorced. Has 2 kids(fraternal twins) daughters age 23. Works at [employer], Never smoked, denies ETOH, no drugs. Drinks diet coke. No exercise.       DPR daughters [name] and Ashleyanne Hemmingway twins          Immunization History  Administered Date(s) Administered  . Influenza, Quadrivalent, Recombinant, Inj, Pf 10/14/2013  . Influenza, Seasonal, Injecte, Preservative Fre 09/19/2016   . Influenza,inj,Quad PF,6+ Mos 09/30/2014, 09/03/2018  . Influenza,inj,quad, With Preservative 09/23/2017  . Influenza-Unspecified 09/30/2014, 09/30/2016, 09/03/2018, 09/10/2019, 09/28/2020  . PFIZER(Purple Top)SARS-COV-2 Vaccination 12/28/2019, 01/18/2020  . Pneumococcal Conjugate-13 07/16/2016  . Pneumococcal Polysaccharide-23 01/01/2004, 10/15/2006, 01/04/2020  . Td 10/01/2003  . Tdap 01/09/2011, 06/25/2018  . Zoster Recombinat (Shingrix) 09/14/2020, 12/15/2020     Objective: Vital Signs: BP 134/76 (BP Location: Left Arm, Patient Position: Sitting, Cuff Size: Normal)   Pulse 66   Resp 14   Ht 5\' 3"  (1.6 m)   Wt 183 lb 3.2 oz (83.1 kg)   BMI 32.45 kg/m    Physical Exam Vitals and nursing note reviewed.  Constitutional:      Appearance: She is well-developed.  HENT:     Head: Normocephalic and atraumatic.  Eyes:     Conjunctiva/sclera: Conjunctivae normal.  Pulmonary:     Effort: Pulmonary effort is normal.  Abdominal:     Palpations: Abdomen is soft.  Musculoskeletal:     Cervical back: Normal range of motion.  Skin:    General: Skin is warm and dry.     Capillary Refill: Capillary refill takes less than 2 seconds.  Neurological:     Mental Status: She is alert and oriented to person, place, and time.  Psychiatric:        Behavior: Behavior normal.      Musculoskeletal Exam: C-spine limited range of motion with lateral rotation.  Thoracic and lumbar spine a painful range of motion.  Shoulder joints have painful range of motion with slightly limited abduction and internal rotation.  Bilateral elbow joint contractures noted.  Wrist joints have good range of motion with no tenderness to palpation.  No tenderness or synovitis over MCP joints.  PIP and DIP thickening consistent with osteoarthritis of both hands noted.  Complete fist formation bilaterally.  Left hip has limited range of motion.  Tenderness palpation over the left trochanteric bursa.  Knee joints have good  range of motion with no warmth or effusion.  She has tenderness location over the left ankle joint but no synovitis was noted.  CDAI Exam: CDAI Score: 1  Patient Global: 6 mm; Provider Global: 4 mm Swollen: 0 ; Tender: 2  Joint Exam 04/07/2021      Right  Left  Hip      Tender  Ankle      Tender   There is currently no information documented on the homunculus. Go to the Rheumatology activity and complete the homunculus joint exam.  Investigation: No additional findings.  Imaging: MR Thoracic Spine Wo Contrast  Result Date: 04/03/2021 CLINICAL DATA:  Mid lower back pain with left leg radiculopathy. EXAM: MRI THORACIC AND LUMBAR SPINE WITHOUT CONTRAST TECHNIQUE: Multiplanar and multiecho pulse sequences of the thoracic and lumbar spine were obtained without intravenous contrast. COMPARISON:  MRI March 08, 2018. FINDINGS: MRI THORACIC SPINE FINDINGS Alignment: Normal. Vertebrae: Vertebral body heights are maintained. No specific evidence of acute fracture, discitis/osteomyelitis, or suspicious bone lesion. Scattered T1 hyperintense benign vertebral venous malformations. Cord:  Normal cord signal. Paraspinal and other soft tissues: Unremarkable. Disc levels: Prominent dorsal epidural fat throughout the thoracic spine, resulting in mild multilevel canal stenosis that is similar to prior. Suspected slight increase in a small right paracentral disc protrusion and T9-T10 which contacts and flattens the right ventral cord. Similar small left paracentral disc protrusion at T11-T12, which minimally effaces ventral CSF. No evidence of significant foraminal stenosis. MRI LUMBAR SPINE FINDINGS Segmentation:  Standard. Alignment: Normal. Vertebrae: Vertebral body heights are maintained. No specific evidence of acute fracture, discitis/osteomyelitis, or suspicious bone lesion. Conus medullaris and cauda equina: Conus extends to the L1 level. Conus appears normal. Paraspinal and other soft tissues: Right renal  cyst. Otherwise unremarkable. Disc levels: T12-L1: No significant disc protrusion, foraminal stenosis, or canal stenosis. L1-L2: No significant disc protrusion, foraminal stenosis, or canal stenosis. L2-L3: Mild facet hypertrophy without significant canal or foraminal stenosis. L3-L4: Mild broad disc bulge and mild bilateral facet hypertrophy. Ligamentum flavum thickening. Mild canal stenosis, slightly progressed. Mild left foraminal stenosis. L4-L5: Left eccentric disc bulge, mild to moderate facet hypertrophy and ligamentum flavum thickening. Mild canal and left subarticular recess  stenosis, slightly progressed from prior. Disc contacts the descending left L5 nerve roots without evidence of impingement. Mild left foraminal stenosis, similar to prior. No significant right foraminal stenosis. L5-S1: Mild disc bulging and mild facet hypertrophy without significant canal or foraminal stenosis. IMPRESSION: MR THORACIC SPINE IMPRESSION 1. Suspected slight increase in a small right paracentral disc protrusion at T9-T10 that contacts and flattens the right ventral cord. 2. Prominent dorsal epidural fat throughout the thoracic spine results in mild multilevel canal stenosis that is similar to prior. 3. No significant foraminal stenosis in the thoracic spine. MR LUMBAR SPINE IMPRESSION 1. Mild canal stenosis at L3-L4 and L4-L5, slightly progressed. Mild left subarticular recess stenosis at L4-L5 where disc contacts the descending left L5 nerve roots without evidence of impingement. 2. Similar mild left foraminal stenosis at L3-L4 and L4-L5. Electronically Signed   By: Margaretha Sheffield MD   On: 04/03/2021 11:25   MR Lumbar Spine Wo Contrast  Result Date: 04/03/2021 CLINICAL DATA:  Mid lower back pain with left leg radiculopathy. EXAM: MRI THORACIC AND LUMBAR SPINE WITHOUT CONTRAST TECHNIQUE: Multiplanar and multiecho pulse sequences of the thoracic and lumbar spine were obtained without intravenous contrast. COMPARISON:   MRI March 08, 2018. FINDINGS: MRI THORACIC SPINE FINDINGS Alignment: Normal. Vertebrae: Vertebral body heights are maintained. No specific evidence of acute fracture, discitis/osteomyelitis, or suspicious bone lesion. Scattered T1 hyperintense benign vertebral venous malformations. Cord:  Normal cord signal. Paraspinal and other soft tissues: Unremarkable. Disc levels: Prominent dorsal epidural fat throughout the thoracic spine, resulting in mild multilevel canal stenosis that is similar to prior. Suspected slight increase in a small right paracentral disc protrusion and T9-T10 which contacts and flattens the right ventral cord. Similar small left paracentral disc protrusion at T11-T12, which minimally effaces ventral CSF. No evidence of significant foraminal stenosis. MRI LUMBAR SPINE FINDINGS Segmentation:  Standard. Alignment: Normal. Vertebrae: Vertebral body heights are maintained. No specific evidence of acute fracture, discitis/osteomyelitis, or suspicious bone lesion. Conus medullaris and cauda equina: Conus extends to the L1 level. Conus appears normal. Paraspinal and other soft tissues: Right renal cyst. Otherwise unremarkable. Disc levels: T12-L1: No significant disc protrusion, foraminal stenosis, or canal stenosis. L1-L2: No significant disc protrusion, foraminal stenosis, or canal stenosis. L2-L3: Mild facet hypertrophy without significant canal or foraminal stenosis. L3-L4: Mild broad disc bulge and mild bilateral facet hypertrophy. Ligamentum flavum thickening. Mild canal stenosis, slightly progressed. Mild left foraminal stenosis. L4-L5: Left eccentric disc bulge, mild to moderate facet hypertrophy and ligamentum flavum thickening. Mild canal and left subarticular recess stenosis, slightly progressed from prior. Disc contacts the descending left L5 nerve roots without evidence of impingement. Mild left foraminal stenosis, similar to prior. No significant right foraminal stenosis. L5-S1: Mild disc  bulging and mild facet hypertrophy without significant canal or foraminal stenosis. IMPRESSION: MR THORACIC SPINE IMPRESSION 1. Suspected slight increase in a small right paracentral disc protrusion at T9-T10 that contacts and flattens the right ventral cord. 2. Prominent dorsal epidural fat throughout the thoracic spine results in mild multilevel canal stenosis that is similar to prior. 3. No significant foraminal stenosis in the thoracic spine. MR LUMBAR SPINE IMPRESSION 1. Mild canal stenosis at L3-L4 and L4-L5, slightly progressed. Mild left subarticular recess stenosis at L4-L5 where disc contacts the descending left L5 nerve roots without evidence of impingement. 2. Similar mild left foraminal stenosis at L3-L4 and L4-L5. Electronically Signed   By: Margaretha Sheffield MD   On: 04/03/2021 11:25    Recent Labs: Lab Results  Component Value Date   WBC 6.1 01/06/2021   HGB 12.5 01/06/2021   PLT 218 01/06/2021   NA 139 02/15/2021   K 4.0 02/15/2021   CL 100 02/15/2021   CO2 22 02/15/2021   GLUCOSE 105 (H) 02/15/2021   BUN 15 02/15/2021   CREATININE 0.90 02/15/2021   BILITOT 0.5 01/06/2021   ALKPHOS 65 08/28/2020   AST 24 01/06/2021   ALT 21 01/06/2021   PROT 6.6 01/06/2021   ALBUMIN 4.2 08/28/2020   CALCIUM 9.1 02/15/2021   GFRAA 84 02/15/2021   QFTBGOLDPLUS NEGATIVE 10/10/2020    Speciality Comments: PLQ Eye Exam: 01/06/2020 ABNORMAL @ [facility]. Patient advised to discontinue PLQ at office visit on 01/06/2020.  Prior Therapy: methotrexate/Xeljanz/Actemra (GI upset), Enbrel (injection site reaction) and Orencia/Remicade/Humira/Cimzia/Rituxan/Simponi/Simponi Aria (inadequate response).  Procedures:  Large Joint Inj: L greater trochanter on 04/07/2021 9:02 AM Indications: pain Details: 27 G 1.5 in needle, lateral approach  Arthrogram: No  Medications: 40 mg triamcinolone acetonide 40 MG/ML; 1.5 mL lidocaine 1 % Aspirate: 0 mL Outcome: tolerated well, no  immediate complications Procedure, treatment alternatives, risks and benefits explained, specific risks discussed. Consent was given by the patient. Immediately prior to procedure a time out was called to verify the correct patient, procedure, equipment, support staff and site/side marked as required. Patient was prepped and draped in the usual sterile fashion.     Allergies: Actemra [tocilizumab], Prochlorperazine, Ramipril, Shellfish-derived products, Atorvastatin, Compazine  [prochlorperazine edisylate], Emgality [galcanezumab-gnlm], Etanercept, Infliximab, Iohexol, Orencia [abatacept], Prochlorperazine edisylate, Shellfish allergy, Tofacitinib, Trokendi xr [topiramate er], Tramadol, Amiodarone, and Rituximab   Assessment / Plan:     Visit Diagnoses: Rheumatoid arthritis of multiple sites with negative rheumatoid factor (HCC) - Methotrexate/Xeljanz/Actemra (GI upset), Enbrel (injection site reaction) and Orencia/Remicade/Humira/Cimzia/Rituxan/Simponi/Simponi Aria (inadequate response): She has no synovitis on exam.  She continues to have arthralgias and joint stiffness.  She has been experiencing increased generalized pain since discontinuing Relafen as recommended by her GI specialist due to recurrent colitis.  Overall she is clinically doing well on Rinvoq 15 mg 1 tablet by mouth daily and Arava 20 mg 1 tablet by mouth daily.  She is tolerating both medications without any side effects.  She continues to take oxycodone as needed for pain relief.  She will continue on the current treatment regimen.  She does not need any refills at this time.  She was advised to notify us if she develops increased joint pain or joint swelling.  She will follow-up in the office in 5 months.  High risk medication use - Rinvoq 15 mg 1 tablet by mouth once daily and Arava 20 mg 1 tablet by mouth daily. CBC and CMP updated on 01/06/21.  She is due to update lab work.  Orders for CBC and CMP released.  Her next lab work will  be due in July and every 3 months.  Standing orders for CBC and CMP are in place.  TB gold negative on 10/10/2020.  Lipid panel within normal limits on 05/23/2020.   - Plan: CBC with Differential/Platelet, COMPLETE METABOLIC PANEL WITH GFR She has not had any recent infections.  We discussed the importance of holding Rinvoq and Arava if she develops signs or symptoms of an infection and to resume once the infection has completely cleared.  She voiced understanding. She is not planning on receiving the booster dose. Discussed the black box warning for Jak inhibitors including increased risk for MACE and malignancy. Association of heart disease with  rheumatoid arthritis was discussed. Need to monitor blood pressure, cholesterol, and to exercise 30-60 minutes on daily basis was discussed.  Psoriasis: She has no active psoriasis at this time.   Contracture of joint of both elbows: Unchanged.  No tenderness or inflammation noted on exam.   Osteopenia of multiple sites - Most recent DEXA on 11/11/19: The BMD measured at Femur Neck Left is 0.901 g/cm2 with a T-score of -1.0-Normal. Repeat DEXA in 5 years.    DDD (degenerative disc disease), thoracic: Chronic pain.  MRI of thoracic spine performed on 04/02/21: Suspected slight increase in a small right paracentral disc protrusion at T9-10 that contacts and flattens the right ventral cord.  Prominent dorsal epidural fat throughout the thoracic spine results in mild multilevel canal stenosis that is similar to prior.   DDD (degenerative disc disease), lumbar: Chronic pain.  She had a MRI of the lumbar spine on 04/02/21: Mild canal stenosis at L3-L4 and L4-L5, slightly progressed.  She takes oxycodone at bedtime for pain relief.   Trochanteric bursitis, left hip: She has tenderness palpation over the left trochanteric bursa.  Her discomfort started about 3 weeks ago and has progressively been worsening.  She has difficulty walking for prolonged periods of time as  well as lying on her left side at night due to the discomfort.  She requested a left trochanteric bursa cortisone injection.  She tolerated procedure well.  Procedure note was completed above.  Aftercare was discussed. She was advised to notify us if her symptoms persist or worsen.    Carpal tunnel syndrome, left upper limb: She is scheduled for carpal tunnel release by Dr. Fredna Dow on 06/06/2021.  We discussed that we recommend holding [place] and Rinvoq  for 1 week prior to surgery and then she will require clearance by Dr. Fredna Dow prior to resuming these medications during the postoperative period.   Other fatigue: Stable.  Other insomnia: She continues to have nocturnal pain contributing to insomnia.   History of hyperlipidemia:  Discussed the importance of close monitoring.    History of hypertension: BP was 134/76.  Discussed the importance of monitoring her blood pressure closely.    Other medical conditions are listed as follows:   History of gastroesophageal reflux (GERD)  History of Graves' disease  History of coronary artery disease  History of migraine  History of diabetes mellitus: On Insulin.   History of vitamin D deficiency  Abnormal SPEP  Orders: Orders Placed This Encounter  Procedures  . Large Joint Inj  . CBC with Differential/Platelet  . COMPLETE METABOLIC PANEL WITH GFR   No orders of the defined types were placed in this encounter.   Follow-Up Instructions: Return in about 5 months (around 09/07/2021) for Rheumatoid arthritis, DDD.   Ofilia Neas, PA-C  Note - This record has been created using Dragon software.  Chart creation errors have been sought, but may not always  have been located. Such creation errors do not reflect on  the standard of medical care.

## 2021-04-07 NOTE — Patient Instructions (Signed)
Standing Labs We placed an order today for your standing lab work.   Please have your standing labs drawn in July and every 3 months   If possible, please have your labs drawn 2 weeks prior to your appointment so that the provider can discuss your results at your appointment.  We have open lab daily Monday through Thursday from 1:30-4:30 PM and Friday from 1:30-4:00 PM at the office of Dr. Shaili Deveshwar, [facility].   Please be advised, all patients with office appointments requiring lab work will take precedents over walk-in lab work.  If possible, please come for your lab work on Monday and Friday afternoons, as you may experience shorter wait times. The office is located at [address], [address], [place], [place] [address] No appointment is necessary.   Labs are drawn by [facility]. Please bring your co-pay at the time of your lab draw.  You may receive a bill from [facility] for your lab work.  If you wish to have your labs drawn at another location, please call the office 24 hours in advance to send orders.  If you have any questions regarding directions or hours of operation,  please call [phone].   As a reminder, please drink plenty of water prior to coming for your lab work. Thanks!     

## 2021-04-07 NOTE — Telephone Encounter (Signed)
   [facility] Pre-operative Risk Assessment    Patient Name: [name]  DOB: [birth year]  MRN: [id number]   HEARTCARE STAFF: - Please ensure there is not already an duplicate clearance open for this procedure. - Under Visit Info/Reason for Call, type in Other and utilize the format Clearance MM/DD/YY or Clearance TBD. Do not use dashes or single digits. - If request is for dental extraction, please clarify the # of teeth to be extracted.  Request for surgical clearance:  1. What type of surgery is being performed? LEFT CARPAL TUNNEL RELEASE, LEFT ULNAR NERVE DECOMPRESSION WITH POSSIBLE TRANSPOSITION   2. When is this surgery scheduled? 06/06/21   3. What type of clearance is required (medical clearance vs. Pharmacy clearance to hold med vs. Both)? MEDICAL  4. Are there any medications that need to be held prior to surgery and how long? ASA AND PLAVIX   5. Practice name and name of physician performing surgery? [facility]; DR. Iroquois   6. What is the office phone number? [phone]   7.   What is the office fax number? [place].   Anesthesia type (None, local, MAC, general) ? AXILLARY BLOCK   Julaine Hua 04/07/2021, 10:49 AM  _________________________________________________________________   (provider comments below)

## 2021-04-07 NOTE — Telephone Encounter (Signed)
   Patient Name: [name]  DOB: 03-19-1966  MRN: [id number]   Primary Cardiologist: Sherren Mocha, MD  Chart reviewed as part of pre-operative protocol coverage. Patient was contacted 04/07/2021 in reference to pre-operative risk assessment for pending surgery as outlined below.  JESSICCA STITZER was last seen on 12/09/2021 by Kerin Ransom PA-C.  Since that day, ALONZO LOVING has done well without chest pain or shortness of breath.  She is clearly able to accomplish more than 4 METS of activity.  Therefore, based on ACC/AHA guidelines, the patient would be at acceptable risk for the planned procedure without further cardiovascular testing.   The patient was advised that if she develops new symptoms prior to surgery to contact our office to arrange for a follow-up visit, and she verbalized understanding.  I will route this recommendation to the requesting party via Epic fax function and remove from pre-op pool. Please call with questions.  Dr. Burt Knack to review her aspirin and Plavix and see if she is able to hold both medication for 5 days prior to the surgery and restart as soon as possible afterward.  Please forward your response to P CV [place], PA 04/07/2021, 1:59 PM

## 2021-04-08 LAB — COMPLETE METABOLIC PANEL WITH GFR
AG Ratio: 2.1 (calc) (ref 1.0–2.5)
ALT: 21 U/L (ref 6–29)
AST: 21 U/L (ref 10–35)
Albumin: 4.6 g/dL (ref 3.6–5.1)
Alkaline phosphatase (APISO): 88 U/L (ref 37–153)
BUN: 18 mg/dL (ref 7–25)
CO2: 30 mmol/L (ref 20–32)
Calcium: 9.5 mg/dL (ref 8.6–10.4)
Chloride: 103 mmol/L (ref 98–110)
Creat: 0.89 mg/dL (ref 0.50–1.05)
GFR, Est African American: 86 mL/min/{1.73_m2} (ref >=60)
GFR, Est Non African American: 74 mL/min/{1.73_m2} (ref >=60)
Globulin: 2.2 g/dL (calc) (ref 1.9–3.7)
Glucose, Bld: 112 mg/dL — ABNORMAL HIGH (ref 65–99)
Potassium: 3.8 mmol/L (ref 3.5–5.3)
Sodium: 141 mmol/L (ref 135–146)
Total Bilirubin: 0.4 mg/dL (ref 0.2–1.2)
Total Protein: 6.8 g/dL (ref 6.1–8.1)

## 2021-04-08 LAB — CBC WITH DIFFERENTIAL/PLATELET
Absolute Monocytes: 600 cells/uL (ref 200–950)
Basophils Absolute: 41 cells/uL (ref 0–200)
Basophils Relative: 0.6 %
Eosinophils Absolute: 228 cells/uL (ref 15–500)
Eosinophils Relative: 3.3 %
HCT: 37 % (ref 35.0–45.0)
Hemoglobin: 12.4 g/dL (ref 11.7–15.5)
Lymphs Abs: 1842 cells/uL (ref 850–3900)
MCH: 29.7 pg (ref 27.0–33.0)
MCHC: 33.5 g/dL (ref 32.0–36.0)
MCV: 88.5 fL (ref 80.0–100.0)
MPV: 9.8 fL (ref 7.5–12.5)
Monocytes Relative: 8.7 %
Neutro Abs: 4188 cells/uL (ref 1500–7800)
Neutrophils Relative %: 60.7 %
Platelets: 239 10*3/uL (ref 140–400)
RBC: 4.18 10*6/uL (ref 3.80–5.10)
RDW: 12.7 % (ref 11.0–15.0)
Total Lymphocyte: 26.7 %
WBC: 6.9 10*3/uL (ref 3.8–10.8)

## 2021-04-09 NOTE — Progress Notes (Signed)
Glucose is 112. Rest of CMP WNL.  CBC WNL.

## 2021-04-24 DIAGNOSIS — E1065 Type 1 diabetes mellitus with hyperglycemia: Secondary | ICD-10-CM | POA: Diagnosis not present

## 2021-04-24 DIAGNOSIS — E1039 Type 1 diabetes mellitus with other diabetic ophthalmic complication: Secondary | ICD-10-CM | POA: Diagnosis not present

## 2021-04-24 DIAGNOSIS — E10319 Type 1 diabetes mellitus with unspecified diabetic retinopathy without macular edema: Secondary | ICD-10-CM | POA: Diagnosis not present

## 2021-04-24 DIAGNOSIS — E109 Type 1 diabetes mellitus without complications: Secondary | ICD-10-CM | POA: Diagnosis not present

## 2021-04-26 ENCOUNTER — Other Ambulatory Visit: Payer: Self-pay | Admitting: Physician Assistant

## 2021-04-26 ENCOUNTER — Other Ambulatory Visit (HOSPITAL_COMMUNITY): Payer: Self-pay

## 2021-04-26 ENCOUNTER — Other Ambulatory Visit: Payer: Self-pay | Admitting: Cardiovascular Disease

## 2021-04-26 DIAGNOSIS — M0609 Rheumatoid arthritis without rheumatoid factor, multiple sites: Secondary | ICD-10-CM

## 2021-04-26 NOTE — Telephone Encounter (Signed)
Next Visit: 09/14/2021  Last Visit: 04/07/2021  Last Fill: 02/06/2021  DX:  Rheumatoid arthritis of multiple sites with negative rheumatoid factor   Current Dose per office note 04/07/2021, Rinvoq 15 mg 1 tablet by mouth once daily   Labs: 04/07/2021, Glucose is 112. Rest of CMP WNL. CBC WNL.   TB Gold:  10/10/2020, negative  Okay to refill Rinvoq?

## 2021-04-26 NOTE — Telephone Encounter (Signed)
Please check on status of lab work

## 2021-04-27 ENCOUNTER — Other Ambulatory Visit: Payer: Self-pay

## 2021-04-27 ENCOUNTER — Other Ambulatory Visit (HOSPITAL_COMMUNITY): Payer: Self-pay

## 2021-04-27 ENCOUNTER — Other Ambulatory Visit: Payer: Self-pay | Admitting: Pharmacist

## 2021-04-27 DIAGNOSIS — M0609 Rheumatoid arthritis without rheumatoid factor, multiple sites: Secondary | ICD-10-CM

## 2021-04-27 MED ORDER — RINVOQ 15 MG PO TB24
ORAL_TABLET | ORAL | 2 refills | Status: DC
  Filled 2021-04-27: qty 30, 30d supply, fill #0
  Filled 2021-05-30: qty 30, 30d supply, fill #1
  Filled 2021-07-04: qty 30, 30d supply, fill #2

## 2021-04-27 MED ORDER — RINVOQ 15 MG PO TB24
ORAL_TABLET | ORAL | 2 refills | Status: DC
  Filled 2021-04-27: qty 30, fill #0

## 2021-04-27 MED ORDER — ROSUVASTATIN CALCIUM 20 MG PO TABS
20.0000 mg | ORAL_TABLET | Freq: Every day | ORAL | 2 refills | Status: DC
  Filled 2021-04-27 – 2021-06-18 (×2): qty 90, 90d supply, fill #0
  Filled 2021-09-24: qty 90, 90d supply, fill #1
  Filled 2021-12-17: qty 90, 90d supply, fill #2

## 2021-04-28 ENCOUNTER — Other Ambulatory Visit: Payer: Self-pay

## 2021-04-28 ENCOUNTER — Other Ambulatory Visit: Payer: Self-pay | Admitting: Physician Assistant

## 2021-04-28 MED ORDER — CYCLOBENZAPRINE HCL 10 MG PO TABS
ORAL_TABLET | ORAL | 2 refills | Status: DC
  Filled 2021-04-28: qty 30, 30d supply, fill #0
  Filled 2021-05-29: qty 30, 30d supply, fill #1
  Filled 2021-06-18: qty 30, 30d supply, fill #2

## 2021-05-01 ENCOUNTER — Other Ambulatory Visit: Payer: Self-pay | Admitting: Cardiovascular Disease

## 2021-05-01 ENCOUNTER — Other Ambulatory Visit: Payer: Self-pay

## 2021-05-01 DIAGNOSIS — E1159 Type 2 diabetes mellitus with other circulatory complications: Secondary | ICD-10-CM

## 2021-05-01 DIAGNOSIS — I152 Hypertension secondary to endocrine disorders: Secondary | ICD-10-CM

## 2021-05-01 MED ORDER — METOPROLOL TARTRATE 50 MG PO TABS
25.0000 mg | ORAL_TABLET | Freq: Two times a day (BID) | ORAL | 3 refills | Status: DC
  Filled 2021-05-01: qty 90, 90d supply, fill #0
  Filled 2021-07-22: qty 90, 90d supply, fill #1
  Filled 2021-11-02: qty 90, 90d supply, fill #2
  Filled 2022-01-14: qty 90, 90d supply, fill #3

## 2021-05-01 MED ORDER — INSULIN LISPRO 100 UNIT/ML IJ SOLN
INTRAMUSCULAR | 5 refills | Status: DC
  Filled 2021-05-01: qty 90, 90d supply, fill #0
  Filled 2021-07-18: qty 90, 90d supply, fill #1

## 2021-05-01 MED ORDER — LEFLUNOMIDE 20 MG PO TABS
ORAL_TABLET | Freq: Every day | ORAL | 0 refills | Status: DC
  Filled 2021-05-01: qty 90, 90d supply, fill #0

## 2021-05-01 NOTE — Telephone Encounter (Signed)
Next Visit: 09/14/2021  Last Visit: 04/07/2021  Last Fill: 01/25/2021  DX: Rheumatoid arthritis of multiple sites with negative rheumatoid factor   Current Dose per office note 04/07/2021, Arava 20 mg 1 tablet by mouth daily  Labs: 04/07/2021, Glucose is 112. Rest of CMP WNL. CBC WNL.   Okay to refill Arava?

## 2021-05-02 ENCOUNTER — Other Ambulatory Visit: Payer: Self-pay

## 2021-05-03 ENCOUNTER — Other Ambulatory Visit: Payer: Self-pay

## 2021-05-07 ENCOUNTER — Other Ambulatory Visit: Payer: Self-pay | Admitting: Cardiovascular Disease

## 2021-05-07 DIAGNOSIS — I10 Essential (primary) hypertension: Secondary | ICD-10-CM

## 2021-05-07 MED FILL — Pantoprazole Sodium EC Tab 40 MG (Base Equiv): ORAL | 90 days supply | Qty: 90 | Fill #0 | Status: AC

## 2021-05-08 ENCOUNTER — Other Ambulatory Visit: Payer: Self-pay

## 2021-05-08 MED ORDER — POTASSIUM CHLORIDE ER 10 MEQ PO TBCR
10.0000 meq | EXTENDED_RELEASE_TABLET | Freq: Every day | ORAL | 3 refills | Status: DC
  Filled 2021-05-08: qty 90, 90d supply, fill #0
  Filled 2021-07-22: qty 90, 90d supply, fill #1
  Filled 2021-11-14: qty 90, 90d supply, fill #2
  Filled 2022-01-27: qty 90, 90d supply, fill #3

## 2021-05-17 ENCOUNTER — Ambulatory Visit: Payer: 59 | Admitting: Internal Medicine

## 2021-05-23 ENCOUNTER — Ambulatory Visit: Payer: 59 | Attending: Family Medicine | Admitting: Pharmacist

## 2021-05-23 ENCOUNTER — Other Ambulatory Visit (HOSPITAL_COMMUNITY): Payer: Self-pay

## 2021-05-23 ENCOUNTER — Other Ambulatory Visit: Payer: Self-pay

## 2021-05-23 DIAGNOSIS — Z79899 Other long term (current) drug therapy: Secondary | ICD-10-CM

## 2021-05-23 NOTE — Progress Notes (Signed)
  S: Patient presents today for review of their specialty medication.   Patient is currently taking Rinvoq for rheumatoid arthritis. Patient is managed by Hazel Sams for this.   Dosing: Adult  Note: May be used as monotherapy or in combination with methotrexate or other nonbiologic disease-modifying antirheumatic drugs (DMARDs); use in combination with biologic DMARDS or potent immunosuppressants (eg, azathioprine, cyclosporine) is not recommended. Do not initiate therapy in patients with an absolute lymphocyte count <500/mm3, ANC <1,000/mm3, or hemoglobin <8 g/dL. Rheumatoid arthritis: Oral: 15 mg once daily.  Adherence: confirmed   Efficacy: confirmed   Monitoring: S/sx thromboembolism: denies  S/sx malignancy: denies  S/sx of infection: denies   Current adverse effects: denies   O:   Lab Results  Component Value Date   WBC 6.9 04/07/2021   HGB 12.4 04/07/2021   HCT 37.0 04/07/2021   MCV 88.5 04/07/2021   PLT 239 04/07/2021      Chemistry      Component Value Date/Time   NA 141 04/07/2021 0855   NA 139 02/15/2021 1141   K 3.8 04/07/2021 0855   CL 103 04/07/2021 0855   CO2 30 04/07/2021 0855   BUN 18 04/07/2021 0855   BUN 15 02/15/2021 1141   CREATININE 0.89 04/07/2021 0855   GLU 119 07/16/2016 0000      Component Value Date/Time   CALCIUM 9.5 04/07/2021 0855   ALKPHOS 65 08/28/2020 0955   AST 21 04/07/2021 0855   ALT 21 04/07/2021 0855   BILITOT 0.4 04/07/2021 0855      Lab Results  Component Value Date   CHOL 144 05/23/2020   HDL 57.70 05/23/2020   LDLCALC 61 05/23/2020   TRIG 123.0 05/23/2020   CHOLHDL 2 05/23/2020    A/P: 1. Medication review: patient currently taking Rinvoq for rheumatoid arthritis and doing well on the medication. Reviewed the medication with the patient, including the following: Rinvoq is a medication used to treat rheumatoid arthritis. Administer with or without food. Swallow tablet whole; do not crush, split, or chew.  Possible adverse effects include increased risk of infection, GI upset, hematologic toxicity, hepatic effects, lipid abnormalities, increased risk of malignancy, thromboembolism. Avoid live vaccinations. No recommendations for any changes.  Benard Halsted, PharmD, [facility] [place], [place] [phone]

## 2021-05-25 ENCOUNTER — Other Ambulatory Visit (HOSPITAL_COMMUNITY): Payer: Self-pay

## 2021-05-25 DIAGNOSIS — G4733 Obstructive sleep apnea (adult) (pediatric): Secondary | ICD-10-CM | POA: Diagnosis not present

## 2021-05-26 ENCOUNTER — Other Ambulatory Visit: Payer: Self-pay

## 2021-05-26 MED ORDER — FUROSEMIDE 40 MG PO TABS
ORAL_TABLET | ORAL | 1 refills | Status: DC
  Filled 2021-05-26: qty 90, 60d supply, fill #0

## 2021-05-30 ENCOUNTER — Other Ambulatory Visit: Payer: Self-pay

## 2021-05-30 ENCOUNTER — Other Ambulatory Visit (HOSPITAL_COMMUNITY): Payer: Self-pay

## 2021-05-30 ENCOUNTER — Encounter (HOSPITAL_BASED_OUTPATIENT_CLINIC_OR_DEPARTMENT_OTHER): Payer: Self-pay | Admitting: Orthopedic Surgery

## 2021-06-01 ENCOUNTER — Encounter (HOSPITAL_BASED_OUTPATIENT_CLINIC_OR_DEPARTMENT_OTHER)
Admission: RE | Admit: 2021-06-01 | Discharge: 2021-06-01 | Disposition: A | Discharge status: Home / Self Care | Payer: 59 | Source: Ambulatory Visit | Attending: Orthopedic Surgery | Admitting: Orthopedic Surgery

## 2021-06-01 ENCOUNTER — Ambulatory Visit: Payer: 59 | Admitting: Neurology

## 2021-06-01 ENCOUNTER — Other Ambulatory Visit (HOSPITAL_COMMUNITY): Payer: Self-pay

## 2021-06-01 ENCOUNTER — Other Ambulatory Visit: Payer: Self-pay

## 2021-06-01 DIAGNOSIS — Z951 Presence of aortocoronary bypass graft: Secondary | ICD-10-CM | POA: Diagnosis not present

## 2021-06-01 DIAGNOSIS — Z9641 Presence of insulin pump (external) (internal): Secondary | ICD-10-CM | POA: Diagnosis not present

## 2021-06-01 DIAGNOSIS — Z888 Allergy status to other drugs, medicaments and biological substances status: Secondary | ICD-10-CM | POA: Diagnosis not present

## 2021-06-01 DIAGNOSIS — Z7902 Long term (current) use of antithrombotics/antiplatelets: Secondary | ICD-10-CM | POA: Diagnosis not present

## 2021-06-01 DIAGNOSIS — Z91041 Radiographic dye allergy status: Secondary | ICD-10-CM | POA: Diagnosis not present

## 2021-06-01 DIAGNOSIS — Z91013 Allergy to seafood: Secondary | ICD-10-CM | POA: Diagnosis not present

## 2021-06-01 DIAGNOSIS — Z955 Presence of coronary angioplasty implant and graft: Secondary | ICD-10-CM | POA: Diagnosis not present

## 2021-06-01 DIAGNOSIS — M069 Rheumatoid arthritis, unspecified: Secondary | ICD-10-CM | POA: Diagnosis not present

## 2021-06-01 DIAGNOSIS — E10319 Type 1 diabetes mellitus with unspecified diabetic retinopathy without macular edema: Secondary | ICD-10-CM | POA: Diagnosis not present

## 2021-06-01 DIAGNOSIS — G5602 Carpal tunnel syndrome, left upper limb: Secondary | ICD-10-CM | POA: Diagnosis not present

## 2021-06-01 DIAGNOSIS — G5622 Lesion of ulnar nerve, left upper limb: Secondary | ICD-10-CM | POA: Diagnosis not present

## 2021-06-01 DIAGNOSIS — Z79899 Other long term (current) drug therapy: Secondary | ICD-10-CM | POA: Diagnosis not present

## 2021-06-01 DIAGNOSIS — Z794 Long term (current) use of insulin: Secondary | ICD-10-CM | POA: Diagnosis not present

## 2021-06-01 LAB — BASIC METABOLIC PANEL
Anion gap: 9 (ref 5–15)
BUN: 13 mg/dL (ref 6–20)
CO2: 27 mmol/L (ref 22–32)
Calcium: 9.2 mg/dL (ref 8.9–10.3)
Chloride: 101 mmol/L (ref 98–111)
Creatinine, Ser: 0.86 mg/dL (ref 0.44–1.00)
GFR, Estimated: >60 mL/min (ref >60)
Glucose, Bld: 158 mg/dL — ABNORMAL HIGH (ref 70–99)
Potassium: 3.7 mmol/L (ref 3.5–5.1)
Sodium: 137 mmol/L (ref 135–145)

## 2021-06-01 NOTE — Progress Notes (Signed)

## 2021-06-02 ENCOUNTER — Other Ambulatory Visit: Payer: Self-pay

## 2021-06-06 ENCOUNTER — Encounter (HOSPITAL_BASED_OUTPATIENT_CLINIC_OR_DEPARTMENT_OTHER): Payer: Self-pay | Admitting: Orthopedic Surgery

## 2021-06-06 ENCOUNTER — Other Ambulatory Visit: Payer: Self-pay

## 2021-06-06 ENCOUNTER — Ambulatory Visit (HOSPITAL_BASED_OUTPATIENT_CLINIC_OR_DEPARTMENT_OTHER): Payer: 59 | Admitting: Anesthesiology

## 2021-06-06 ENCOUNTER — Ambulatory Visit (HOSPITAL_BASED_OUTPATIENT_CLINIC_OR_DEPARTMENT_OTHER)
Admission: RE | Admit: 2021-06-06 | Discharge: 2021-06-06 | Disposition: A | Discharge status: Home / Self Care | Payer: 59 | Source: Ambulatory Visit | Attending: Orthopedic Surgery | Admitting: Orthopedic Surgery

## 2021-06-06 ENCOUNTER — Encounter (HOSPITAL_BASED_OUTPATIENT_CLINIC_OR_DEPARTMENT_OTHER)
Admission: RE | Disposition: A | Discharge status: Home / Self Care | Payer: Self-pay | Source: Ambulatory Visit | Attending: Orthopedic Surgery

## 2021-06-06 DIAGNOSIS — G5602 Carpal tunnel syndrome, left upper limb: Secondary | ICD-10-CM | POA: Diagnosis not present

## 2021-06-06 DIAGNOSIS — Z91041 Radiographic dye allergy status: Secondary | ICD-10-CM | POA: Insufficient documentation

## 2021-06-06 DIAGNOSIS — G5622 Lesion of ulnar nerve, left upper limb: Secondary | ICD-10-CM | POA: Diagnosis not present

## 2021-06-06 DIAGNOSIS — Z7902 Long term (current) use of antithrombotics/antiplatelets: Secondary | ICD-10-CM | POA: Insufficient documentation

## 2021-06-06 DIAGNOSIS — E10319 Type 1 diabetes mellitus with unspecified diabetic retinopathy without macular edema: Secondary | ICD-10-CM | POA: Diagnosis not present

## 2021-06-06 DIAGNOSIS — Z794 Long term (current) use of insulin: Secondary | ICD-10-CM | POA: Diagnosis not present

## 2021-06-06 DIAGNOSIS — Z888 Allergy status to other drugs, medicaments and biological substances status: Secondary | ICD-10-CM | POA: Diagnosis not present

## 2021-06-06 DIAGNOSIS — Z79899 Other long term (current) drug therapy: Secondary | ICD-10-CM | POA: Diagnosis not present

## 2021-06-06 DIAGNOSIS — Z9641 Presence of insulin pump (external) (internal): Secondary | ICD-10-CM | POA: Insufficient documentation

## 2021-06-06 DIAGNOSIS — Z91013 Allergy to seafood: Secondary | ICD-10-CM | POA: Insufficient documentation

## 2021-06-06 DIAGNOSIS — G8918 Other acute postprocedural pain: Secondary | ICD-10-CM | POA: Diagnosis not present

## 2021-06-06 DIAGNOSIS — E785 Hyperlipidemia, unspecified: Secondary | ICD-10-CM | POA: Diagnosis not present

## 2021-06-06 DIAGNOSIS — Z955 Presence of coronary angioplasty implant and graft: Secondary | ICD-10-CM | POA: Insufficient documentation

## 2021-06-06 DIAGNOSIS — M069 Rheumatoid arthritis, unspecified: Secondary | ICD-10-CM | POA: Insufficient documentation

## 2021-06-06 DIAGNOSIS — E559 Vitamin D deficiency, unspecified: Secondary | ICD-10-CM | POA: Diagnosis not present

## 2021-06-06 DIAGNOSIS — Z951 Presence of aortocoronary bypass graft: Secondary | ICD-10-CM | POA: Insufficient documentation

## 2021-06-06 HISTORY — DX: Other specified postprocedural states: Z98.890

## 2021-06-06 HISTORY — PX: ULNAR NERVE TRANSPOSITION: SHX2595

## 2021-06-06 HISTORY — PX: CARPAL TUNNEL RELEASE: SHX101

## 2021-06-06 HISTORY — DX: Sleep apnea, unspecified: G47.30

## 2021-06-06 HISTORY — DX: Other specified postprocedural states: R11.2

## 2021-06-06 LAB — GLUCOSE, CAPILLARY
Glucose-Capillary: 107 mg/dL — ABNORMAL HIGH (ref 70–99)
Glucose-Capillary: 147 mg/dL — ABNORMAL HIGH (ref 70–99)
Glucose-Capillary: 68 mg/dL — ABNORMAL LOW (ref 70–99)

## 2021-06-06 SURGERY — CARPAL TUNNEL RELEASE
Anesthesia: Regional | Site: Wrist | Laterality: Left

## 2021-06-06 MED ORDER — BUPIVACAINE HCL (PF) 0.5 % IJ SOLN
INTRAMUSCULAR | Status: DC | PRN
  Administered 2021-06-06: 15 mL via PERINEURAL

## 2021-06-06 MED ORDER — FENTANYL CITRATE (PF) 100 MCG/2ML IJ SOLN
INTRAMUSCULAR | Status: DC | PRN
  Administered 2021-06-06 (×2): 50 ug via INTRAVENOUS

## 2021-06-06 MED ORDER — ONDANSETRON HCL 4 MG/2ML IJ SOLN
INTRAMUSCULAR | Status: AC
  Filled 2021-06-06: qty 2

## 2021-06-06 MED ORDER — EPHEDRINE 5 MG/ML INJ
INTRAVENOUS | Status: AC
  Filled 2021-06-06: qty 10

## 2021-06-06 MED ORDER — CEFAZOLIN SODIUM-DEXTROSE 2-4 GM/100ML-% IV SOLN
2.0000 g | INTRAVENOUS | Status: AC
  Administered 2021-06-06: 2 g via INTRAVENOUS

## 2021-06-06 MED ORDER — MIDAZOLAM HCL 2 MG/2ML IJ SOLN
INTRAMUSCULAR | Status: AC
  Filled 2021-06-06: qty 2

## 2021-06-06 MED ORDER — AMISULPRIDE (ANTIEMETIC) 5 MG/2ML IV SOLN
INTRAVENOUS | Status: AC
  Filled 2021-06-06: qty 4

## 2021-06-06 MED ORDER — LIDOCAINE HCL (CARDIAC) PF 100 MG/5ML IV SOSY
PREFILLED_SYRINGE | INTRAVENOUS | Status: DC | PRN
  Administered 2021-06-06: 60 mg via INTRAVENOUS

## 2021-06-06 MED ORDER — ONDANSETRON HCL 4 MG/2ML IJ SOLN
INTRAMUSCULAR | Status: DC | PRN
  Administered 2021-06-06: 4 mg via INTRAVENOUS

## 2021-06-06 MED ORDER — PROPOFOL 10 MG/ML IV BOLUS
INTRAVENOUS | Status: DC | PRN
  Administered 2021-06-06: 150 mg via INTRAVENOUS

## 2021-06-06 MED ORDER — CEFAZOLIN SODIUM-DEXTROSE 2-4 GM/100ML-% IV SOLN
INTRAVENOUS | Status: AC
  Filled 2021-06-06: qty 100

## 2021-06-06 MED ORDER — OXYCODONE-ACETAMINOPHEN 5-325 MG PO TABS
1.0000 | ORAL_TABLET | ORAL | 0 refills | Status: DC | PRN
  Filled 2021-06-06: qty 20, 4d supply, fill #0

## 2021-06-06 MED ORDER — BUPIVACAINE HCL (PF) 0.25 % IJ SOLN
INTRAMUSCULAR | Status: DC | PRN
  Administered 2021-06-06: 9 mL

## 2021-06-06 MED ORDER — DEXTROSE 50 % IV SOLN
25.0000 mL | Freq: Once | INTRAVENOUS | Status: AC
  Administered 2021-06-06: 25 mL via INTRAVENOUS

## 2021-06-06 MED ORDER — FENTANYL CITRATE (PF) 100 MCG/2ML IJ SOLN
INTRAMUSCULAR | Status: AC
  Filled 2021-06-06: qty 2

## 2021-06-06 MED ORDER — SCOPOLAMINE 1 MG/3DAYS TD PT72
1.0000 | MEDICATED_PATCH | Freq: Once | TRANSDERMAL | Status: DC
  Administered 2021-06-06: 1.5 mg via TRANSDERMAL

## 2021-06-06 MED ORDER — DEXAMETHASONE SODIUM PHOSPHATE 10 MG/ML IJ SOLN
INTRAMUSCULAR | Status: DC | PRN
  Administered 2021-06-06: 5 mg via INTRAVENOUS

## 2021-06-06 MED ORDER — BUPIVACAINE HCL (PF) 0.25 % IJ SOLN
INTRAMUSCULAR | Status: AC
  Filled 2021-06-06: qty 30

## 2021-06-06 MED ORDER — SUCCINYLCHOLINE CHLORIDE 200 MG/10ML IV SOSY
PREFILLED_SYRINGE | INTRAVENOUS | Status: AC
  Filled 2021-06-06: qty 10

## 2021-06-06 MED ORDER — PHENYLEPHRINE 40 MCG/ML (10ML) SYRINGE FOR IV PUSH (FOR BLOOD PRESSURE SUPPORT)
PREFILLED_SYRINGE | INTRAVENOUS | Status: AC
  Filled 2021-06-06: qty 10

## 2021-06-06 MED ORDER — DEXTROSE 50 % IV SOLN
INTRAVENOUS | Status: AC
  Filled 2021-06-06: qty 50

## 2021-06-06 MED ORDER — LACTATED RINGERS IV SOLN: INTRAVENOUS | Status: DC

## 2021-06-06 MED ORDER — DEXAMETHASONE SODIUM PHOSPHATE 10 MG/ML IJ SOLN
INTRAMUSCULAR | Status: AC
  Filled 2021-06-06: qty 1

## 2021-06-06 MED ORDER — SCOPOLAMINE 1 MG/3DAYS TD PT72
MEDICATED_PATCH | TRANSDERMAL | Status: AC
  Filled 2021-06-06: qty 1

## 2021-06-06 MED ORDER — LIDOCAINE HCL (PF) 2 % IJ SOLN
INTRAMUSCULAR | Status: AC
  Filled 2021-06-06: qty 5

## 2021-06-06 MED ORDER — FENTANYL CITRATE (PF) 100 MCG/2ML IJ SOLN
100.0000 ug | Freq: Once | INTRAMUSCULAR | Status: AC
Start: 2021-06-06 — End: 2021-06-06
  Administered 2021-06-06: 100 ug via INTRAVENOUS

## 2021-06-06 MED ORDER — AMISULPRIDE (ANTIEMETIC) 5 MG/2ML IV SOLN
10.0000 mg | Freq: Once | INTRAVENOUS | Status: AC | PRN
Start: 2021-06-06 — End: 2021-06-06
  Administered 2021-06-06: 10 mg via INTRAVENOUS

## 2021-06-06 MED ORDER — FENTANYL CITRATE (PF) 100 MCG/2ML IJ SOLN: 25.0000 ug | INTRAMUSCULAR | Status: DC | PRN

## 2021-06-06 MED ORDER — PHENYLEPHRINE HCL (PRESSORS) 10 MG/ML IV SOLN
INTRAVENOUS | Status: DC | PRN
  Administered 2021-06-06: 80 ug via INTRAVENOUS

## 2021-06-06 MED ORDER — BUPIVACAINE LIPOSOME 1.3 % IJ SUSP
INTRAMUSCULAR | Status: DC | PRN
  Administered 2021-06-06: 10 mL via PERINEURAL

## 2021-06-06 MED ORDER — SCOPOLAMINE 1 MG/3DAYS TD PT72: 1.0000 | MEDICATED_PATCH | TRANSDERMAL | Status: DC

## 2021-06-06 MED ORDER — MIDAZOLAM HCL 2 MG/2ML IJ SOLN
2.0000 mg | Freq: Once | INTRAMUSCULAR | Status: AC
  Administered 2021-06-06: 2 mg via INTRAVENOUS

## 2021-06-06 MED ORDER — ACETAMINOPHEN 10 MG/ML IV SOLN: 1000.0000 mg | Freq: Once | INTRAVENOUS | Status: DC | PRN

## 2021-06-06 SURGICAL SUPPLY — 48 items
APL PRP STRL LF DISP 70% ISPRP (MISCELLANEOUS) ×2
BLADE MINI RND TIP GREEN BEAV (BLADE) IMPLANT
BLADE SURG 15 STRL LF DISP TIS (BLADE) ×2 IMPLANT
BLADE SURG 15 STRL SS (BLADE) ×3
BNDG CMPR 9X4 STRL LF SNTH (GAUZE/BANDAGES/DRESSINGS) ×2
BNDG COHESIVE 3X5 TAN STRL LF (GAUZE/BANDAGES/DRESSINGS) ×6 IMPLANT
BNDG ESMARK 4X9 LF (GAUZE/BANDAGES/DRESSINGS) ×3 IMPLANT
BNDG GAUZE ELAST 4 BULKY (GAUZE/BANDAGES/DRESSINGS) ×3 IMPLANT
CHLORAPREP W/TINT 26 (MISCELLANEOUS) ×3 IMPLANT
CORD BIPOLAR FORCEPS 12FT (ELECTRODE) ×3 IMPLANT
COVER BACK TABLE 60X90IN (DRAPES) ×3 IMPLANT
COVER MAYO STAND STRL (DRAPES) ×3 IMPLANT
COVER WAND RF STERILE (DRAPES) IMPLANT
CUFF TOURN SGL QUICK 18X3 (MISCELLANEOUS) ×3 IMPLANT
CUFF TOURN SGL QUICK 18X4 (TOURNIQUET CUFF) ×3 IMPLANT
DECANTER SPIKE VIAL GLASS SM (MISCELLANEOUS) IMPLANT
DRAPE EXTREMITY T 121X128X90 (DISPOSABLE) ×3 IMPLANT
DRAPE SURG 17X23 STRL (DRAPES) ×3 IMPLANT
DRSG PAD ABDOMINAL 8X10 ST (GAUZE/BANDAGES/DRESSINGS) ×3 IMPLANT
GAUZE 4X4 16PLY RFD (DISPOSABLE) IMPLANT
GAUZE SPONGE 4X4 12PLY STRL (GAUZE/BANDAGES/DRESSINGS) ×3 IMPLANT
GAUZE XEROFORM 1X8 LF (GAUZE/BANDAGES/DRESSINGS) ×3 IMPLANT
GLOVE SURG ORTHO LTX SZ8 (GLOVE) ×3 IMPLANT
GLOVE SURG UNDER POLY LF SZ8.5 (GLOVE) ×3 IMPLANT
GOWN STRL REUS W/ TWL LRG LVL3 (GOWN DISPOSABLE) ×2 IMPLANT
GOWN STRL REUS W/TWL LRG LVL3 (GOWN DISPOSABLE) ×3
GOWN STRL REUS W/TWL XL LVL3 (GOWN DISPOSABLE) ×3 IMPLANT
LOOP VESSEL MAXI BLUE (MISCELLANEOUS) IMPLANT
NDL PRECISIONGLIDE 27X1.5 (NEEDLE) IMPLANT
NEEDLE PRECISIONGLIDE 27X1.5 (NEEDLE) IMPLANT
NS IRRIG 1000ML POUR BTL (IV SOLUTION) ×3 IMPLANT
PACK BASIN DAY SURGERY FS (CUSTOM PROCEDURE TRAY) ×3 IMPLANT
PAD CAST 3X4 CTTN HI CHSV (CAST SUPPLIES) IMPLANT
PAD CAST 4YDX4 CTTN HI CHSV (CAST SUPPLIES) ×2 IMPLANT
PADDING CAST COTTON 3X4 STRL (CAST SUPPLIES)
PADDING CAST COTTON 4X4 STRL (CAST SUPPLIES) ×3
SLEEVE SCD COMPRESS KNEE MED (STOCKING) ×3 IMPLANT
SPLINT PLASTER CAST XFAST 3X15 (CAST SUPPLIES) IMPLANT
SPLINT PLASTER XTRA FASTSET 3X (CAST SUPPLIES)
STOCKINETTE 4X48 STRL (DRAPES) ×3 IMPLANT
SUT ETHILON 4 0 PS 2 18 (SUTURE) ×3 IMPLANT
SUT VIC AB 2-0 SH 27 (SUTURE) ×3
SUT VIC AB 2-0 SH 27XBRD (SUTURE) ×2 IMPLANT
SUT VICRYL 4-0 PS2 18IN ABS (SUTURE) ×3 IMPLANT
SYR BULB EAR ULCER 3OZ GRN STR (SYRINGE) ×3 IMPLANT
SYR CONTROL 10ML LL (SYRINGE) IMPLANT
TOWEL GREEN STERILE FF (TOWEL DISPOSABLE) ×3 IMPLANT
UNDERPAD 30X36 HEAVY ABSORB (UNDERPADS AND DIAPERS) ×3 IMPLANT

## 2021-06-06 NOTE — Anesthesia Procedure Notes (Signed)
Procedure Name: LMA Insertion Date/Time: 06/06/2021 8:58 AM Performed by: Willa Frater, CRNA Pre-anesthesia Checklist: Patient identified, Emergency Drugs available, Suction available and Patient being monitored Patient Re-evaluated:Patient Re-evaluated prior to induction Oxygen Delivery Method: Circle system utilized Preoxygenation: Pre-oxygenation with 100% oxygen Induction Type: IV induction Ventilation: Mask ventilation without difficulty LMA: LMA inserted LMA Size: 4.0 Number of attempts: 1 Airway Equipment and Method: Bite block Placement Confirmation: positive ETCO2 Tube secured with: Tape Dental Injury: Teeth and Oropharynx as per pre-operative assessment

## 2021-06-06 NOTE — Anesthesia Preprocedure Evaluation (Addendum)
Anesthesia Evaluation  Patient identified by MRN, date of birth, ID band Patient awake    Reviewed: Allergy & Precautions, NPO status , Patient's Chart, lab work & pertinent test results  History of Anesthesia Complications (+) PONV  Airway Mallampati: II  TM Distance: >3 FB Neck ROM: Full    Dental  (+) Implants   Pulmonary sleep apnea ,    Pulmonary exam normal        Cardiovascular hypertension, Pt. on medications and Pt. on home beta blockers + CAD, + Past MI, + Cardiac Stents (2010, on Plavix), + CABG (2010) and +CHF   Rhythm:Regular Rate:Normal     Neuro/Psych  Headaches, negative psych ROS   GI/Hepatic Neg liver ROS, GERD  Medicated,  Endo/Other  diabetes, Type 2, Insulin DependentHyperthyroidism   Renal/GU negative Renal ROS  negative genitourinary   Musculoskeletal  (+) Arthritis , Osteoarthritis,    Abdominal (+)  Abdomen: soft.    Peds  Hematology negative hematology ROS (+)   Anesthesia Other Findings   Reproductive/Obstetrics                            Anesthesia Physical Anesthesia Plan  ASA: III  Anesthesia Plan: Regional and General   Post-op Pain Management:  Regional for Post-op pain   Induction: Intravenous  PONV Risk Score and Plan: 4 or greater and Ondansetron, Dexamethasone, Propofol infusion, Midazolam, Treatment may vary due to age or medical condition and Scopolamine patch - Pre-op  Airway Management Planned: Simple Face Mask, Natural Airway and Nasal Cannula  Additional Equipment: None  Intra-op Plan:   Post-operative Plan:   Informed Consent: I have reviewed the patients History and Physical, chart, labs and discussed the procedure including the risks, benefits and alternatives for the proposed anesthesia with the patient or authorized representative who has indicated his/her understanding and acceptance.     Dental advisory given  Plan  Discussed with: CRNA  Anesthesia Plan Comments: (ECHO 01/22: 1. Left ventricular ejection fraction, by estimation, is 60 to 65%. The  left ventricle has normal function. The left ventricle has no regional  wall motion abnormalities. Left ventricular diastolic parameters were  normal.  2. Right ventricular systolic function is normal. The right ventricular  size is normal.  3. The mitral valve is normal in structure. No evidence of mitral valve  regurgitation. No evidence of mitral stenosis.  4. The aortic valve was not well visualized. Aortic valve regurgitation  is not visualized.  5. The inferior vena cava is normal in size with greater than 50%  respiratory variability, suggesting right atrial pressure of 3 mmHg.  Lab Results      Component                Value               Date                      WBC                      6.9                 04/07/2021                HGB                      12.4  04/07/2021                HCT                      37.0                04/07/2021                MCV                      88.5                04/07/2021                PLT                      239                 04/07/2021           Lab Results      Component                Value               Date                      NA                       137                 06/01/2021                K                        3.7                 06/01/2021                CO2                      27                  06/01/2021                GLUCOSE                  158 (H)             06/01/2021                BUN                      13                  06/01/2021                CREATININE               0.86                06/01/2021                CALCIUM                  9.2                 06/01/2021  GFRNONAA                 >60                 06/01/2021                GFRAA                    86                  04/07/2021          )     Anesthesia Quick  Evaluation

## 2021-06-06 NOTE — Anesthesia Postprocedure Evaluation (Signed)
Anesthesia Post Note  Patient: [name]  Procedure(s) Performed: CARPAL TUNNEL RELEASE (Left Wrist) ULNAR NERVE DECOMPRESSION (Left Elbow)     Patient location during evaluation: PACU Anesthesia Type: Regional and General Level of consciousness: awake and alert Pain management: pain level controlled Vital Signs Assessment: post-procedure vital signs reviewed and stable Respiratory status: spontaneous breathing, nonlabored ventilation, respiratory function stable and patient connected to nasal cannula oxygen Cardiovascular status: blood pressure returned to baseline and stable Postop Assessment: no apparent nausea or vomiting Anesthetic complications: no   No complications documented.  Last Vitals:  Vitals:   06/06/21 1050 06/06/21 1115  BP:  (!) 147/75  Pulse: 67 72  Resp: 12 16  Temp:  36.7 C  SpO2: 93% 95%    Last Pain:  Vitals:   06/06/21 1115  TempSrc:   PainSc: 4                  Rachel Samples P Narely Nobles

## 2021-06-06 NOTE — H&P (Signed)
[name] is an 55 y.o. female.   Chief Complaint:numbness left hand HPI: [name] is a 55 year old right hand dominant female who is a former patient of Dr. Rhona Raider who has had bilateral carpal tunnel releases done by Dr. Daylene Katayama and Dr. Laurina Bustle, left side by Dr. Daylene Katayama and right side by Dr. Laurina Bustle 20 years ago.[name] had an injection to the left carpal canal which gave her minimal relief. [name] has positive nerve conductions at both the ulnar nerve at her elbow and carpal tunnel. This reveals a significant motor delay of 7.5 to the median nerve at the wrist prolonged sensory of 4.8 with a decreased slowing of the ulnar nerve at the elbow. [name] has history of diabetes thyroid problems and arthritis with no history of gout. [name] is complaining of numbness and tingling of all of her fingers awaken her at constant basis at night nerve conductions were done by Dr. Tamsen Roers.      Past Medical History:  Diagnosis Date  . ACUT MI ANTEROLAT WALL SUBSQT EPIS CARE   . Acute maxillary sinusitis   . ALLERGIC RHINITIS, SEASONAL   . ARTHRITIS, RHEUMATOID    shoulders and hands Enbrel>leg swelling Dr. Estanislado Pandy  . Atrial fibrillation ([place])    a. after CABG.  . Back pain   . Bruxism (teeth grinding)   . CAD, ARTERY BYPASS GRAFT    a. DES to RCA in 2010 then LAD occlusion s/p CABG 3 06/07/2009 with LIMA to LAD, reverse SVG to D1, reverse SVG to distal RCA. b. Cath 05/08/2016 slightly hypodense region in the intermediate branch, however [name] had excellent flow, FFR was normal. Vein graft to PDA and the posterolateral branch is patent, patent LIMA to LAD, occluded SVG to diagonal.  . CARPAL TUNNEL SYNDROME, BILATERAL   . CHOLELITHIASIS   . Contrast media allergy   . DERMATITIS, ALLERGIC   . DIABETES MELLITUS, TYPE I    on insulin pump dx'ed age 44 y.o   . DIABETIC  RETINOPATHY   . Hiatal hernia   . HYPERLIPIDEMIA-MIXED   . HYPERTHYROIDISM    Dr. Dollene Cleveland  . Insulin pump in place   . MIGRAINE W/O  AURA W/INTRACT W/STATUS MIGRAINOSUS 02/19/2008  . PONV (postoperative nausea and vomiting)   . Psoriasis   . SINUS TACHYCARDIA 11/08/2010  . SVT (supraventricular tachycardia) ([facility])    after s/p CABG  . TRIGGER FINGER    all fingers b/l hands   . URI     Past Surgical History:  Procedure Laterality Date  . ABDOMINAL HYSTERECTOMY     endometriomas b/l; total ? cervix removed; no h/o abnormal pap  . Caesarean section    . CARDIAC CATHETERIZATION N/A 05/08/2016   Procedure: Left Heart Cath and Cors/Grafts Angiography;  Surgeon: Burnell Blanks, MD;  Location: [facility];  Service: Cardiovascular;  Laterality: N/A;  . CARPAL TUNNEL RELEASE     b/l   . CATARACT EXTRACTION, BILATERAL    . CHOLECYSTECTOMY    . COLONOSCOPY WITH PROPOFOL N/A 03/25/2020   Procedure: COLONOSCOPY WITH PROPOFOL;  Surgeon: Jonathon Bellows, MD;  Location: [facility];  Service: Gastroenterology;  Laterality: N/A;  . CORONARY ARTERY BYPASS GRAFT    . ESOPHAGOGASTRODUODENOSCOPY (EGD) WITH PROPOFOL N/A 03/25/2020   Procedure: ESOPHAGOGASTRODUODENOSCOPY (EGD) WITH PROPOFOL;  Surgeon: Jonathon Bellows, MD;  Location: [facility];  Service: Gastroenterology;  Laterality: N/A;  . EYE SURGERY     laser x 2 for retinopathy   . LEFT HEART  CATH AND CORS/GRAFTS ANGIOGRAPHY N/A 02/22/2020   Procedure: LEFT HEART CATH AND CORS/GRAFTS ANGIOGRAPHY;  Surgeon: Burnell Blanks, MD;  Location: [facility];  Service: Cardiovascular;  Laterality: N/A;  . TRIGGER FINGER RELEASE     b/l fingers all   . VITRECTOMY     b/l     Family History  Problem Relation Age of Onset  . Depression Mother   . Anxiety disorder Mother   . Colon polyps Father   . Diabetes Father        2  . Hypertension Father   . Parkinson's disease Father   . Diabetes Other   . Heart disease Other   . Hypertension Other   . Hyperlipidemia Other   . Depression Other   . Migraines Other   . Stroke Paternal Grandmother   . Heart  attack Neg Hx   . Colon cancer Neg Hx   . Stomach cancer Neg Hx   . Esophageal cancer Neg Hx    Social History:  reports that [name] has never smoked. [name] has never used smokeless tobacco. [name] reports current alcohol use. [name] reports that [name] does not use drugs.  Allergies:  Allergies  Allergen Reactions  . Actemra [Tocilizumab]   . Prochlorperazine Rash    Neuro problems per pt  . Ramipril Swelling, Rash and Other (See Comments)  . Shellfish-Derived Products Swelling    Shrimp   . Atorvastatin Rash    Elevated LFT's  . Compazine  [Prochlorperazine Edisylate] Other (See Comments)    Neurological reaction  . Emgality [Galcanezumab-Gnlm] Hives and Swelling  . Etanercept Swelling and Rash  . Infliximab Rash  . Iohexol     Iv contrast dye -rash all over  . Orencia [Abatacept] Rash  . Prochlorperazine Edisylate     unknown  . Shellfish Allergy Swelling    Shrimp(Facial swelling)  . Tofacitinib Rash and Other (See Comments)    Severe abdominal pain   . [employer]]     [employer], memory issues, word finding issues  . Tramadol     Nausea   . Amiodarone Nausea Only  . Rituximab Rash    Causes a rash    No medications prior to admission.    No results found for this or any previous visit (from the past 48 hour(s)).  No results found.   Pertinent items are noted in HPI.  Height 5\' 3"  (1.6 m), weight 81.2 kg.  General appearance: alert, cooperative and appears stated age Head: Normocephalic, without obvious abnormality Neck: no JVD Resp: clear to auscultation bilaterally Cardio: regular rate and rhythm, S1, S2 normal, no murmur, click, rub or gallop GI: soft, non-tender; bowel sounds normal; no masses,  no organomegaly Extremities: numbness left hand Pulses: 2+ and symmetric Skin: Skin color, texture, turgor normal. No rashes or lesions Neurologic: Grossly normal Incision/Wound: na  Assessment/Plan Assessment:  1. Entrapment of left ulnar nerve   2. Carpal tunnel syndrome of left wrist    Plan: We have discussed surgical intervention which [name] would like to proceed with. Prepare postoperative course been discussed along with risk and complications. [name] is aware there is no guarantee to the surgery the possibility of infection recurrence injury to arteries nerves tendons complete relief symptoms and dystrophy. [name] is scheduled for decompression possible transposition ulnar nerve left elbow with carpal tunnel release left hand as an outpatient under regional anesthesia. Questions are encouraged and answered to her satisfaction. [name] is advised we are attempting  to halt the process give the opportunity to get better but cannot make the nerve get better.    Daryll Brod 06/06/2021, 5:58 AM

## 2021-06-06 NOTE — Anesthesia Procedure Notes (Signed)
Anesthesia Regional Block: Supraclavicular block   Pre-Anesthetic Checklist: ,, timeout performed, Correct Patient, Correct Site, Correct Laterality, Correct Procedure, Correct Position, site marked, Risks and benefits discussed,  Surgical consent,  Pre-op evaluation,  At surgeon's request and post-op pain management  Laterality: Left  Prep: Dura Prep       Needles:  Injection technique: Single-shot  Needle Type: Echogenic Stimulator Needle     Needle Length: 5cm  Needle Gauge: 20     Additional Needles:   Procedures:,,,, ultrasound used (permanent image in chart),,,,  Narrative:  Start time: 06/06/2021 8:10 AM End time: 06/06/2021 8:15 AM Injection made incrementally with aspirations every 5 mL.  Performed by: Personally  Anesthesiologist: Darral Dash, DO  Additional Notes: Patient identified. Risks/Benefits/Options discussed with patient including but not limited to bleeding, infection, nerve damage, failed block, incomplete pain control. Patient expressed understanding and wished to proceed. All questions were answered. Sterile technique was used throughout the entire procedure. Please see nursing notes for vital signs. Aspirated in 5cc intervals with injection for negative confirmation. Patient was given instructions on fall risk and not to get out of bed. All questions and concerns addressed with instructions to call with any issues or inadequate analgesia.

## 2021-06-06 NOTE — Transfer of Care (Signed)
Immediate Anesthesia Transfer of Care Note  Patient: [name]  Procedure(s) Performed: CARPAL TUNNEL RELEASE (Left Wrist) ULNAR NERVE DECOMPRESSION/TRANSPOSITION (Left Elbow)  Patient Location: PACU  Anesthesia Type:General  Level of Consciousness: awake, alert , oriented, drowsy and patient cooperative  Airway & Oxygen Therapy: Patient Spontanous Breathing and Patient connected to face mask oxygen  Post-op Assessment: Report given to RN and Post -op Vital signs reviewed and stable  Post vital signs: Reviewed and stable  Last Vitals:  Vitals Value Taken Time  BP    Temp    Pulse 71 06/06/21 1004  Resp    SpO2 91 % 06/06/21 1004  Vitals shown include unvalidated device data.  Last Pain:  Vitals:   06/06/21 0721  TempSrc: Oral  PainSc: 0-No pain      Patients Stated Pain Goal: 3 (22/97/98 9211)  Complications: No complications documented.

## 2021-06-06 NOTE — Brief Op Note (Addendum)
06/06/2021  9:49 AM  PATIENT:  [name]  55 y.o. female  PRE-OPERATIVE DIAGNOSIS:  LEFT MEDIAN NEUROPATHY; LEFT ULNAR NEUROPATHY AT ELBOW  POST-OPERATIVE DIAGNOSIS:  LEFT MEDIAN NEUROPATHY; LEFT ULNAR NEUROPATHY AT ELBOW  PROCEDURE:  Procedure(s) with comments: CARPAL TUNNEL RELEASE (Left) - AXILLARY BLOCK ULNAR NERVE DECOMPRESSION/TRANSPOSITION (Left) - AXILLARY BLOCK  SURGEON:  Surgeon(s) and Role:    Daryll Brod, MD - Primary  PHYSICIAN ASSISTANT:   ASSISTANTS: R Dasnoit,PAC   ANESTHESIA:   local and regional  EBL:  10 mL   BLOOD ADMINISTERED:none  DRAINS: none   LOCAL MEDICATIONS USED: 9 cc of quarter percent bupivacaine  SPECIMEN:  No Specimen  DISPOSITION OF SPECIMEN:  N/A  COUNTS:  YES  TOURNIQUET:  * Missing tourniquet times found for documented tourniquets in log: [id number] *  DICTATION: .Viviann Spare Dictation  PLAN OF CARE: Discharge to home after PACU  PATIENT DISPOSITION:  PACU - hemodynamically stable.

## 2021-06-06 NOTE — Op Note (Signed)
NAME: [name] MEDICAL RECORD NO: [id number] DATE OF BIRTH: Aug 14, 1966 FACILITY: [facility] LOCATION: [facility] PHYSICIAN: Wynonia Sours, MD   OPERATIVE REPORT   DATE OF PROCEDURE: 06/06/21    PREOPERATIVE DIAGNOSIS:   Carpal tunnel syndrome recurrent cubital tunnel syndrome left hand   POSTOPERATIVE DIAGNOSIS:   Same   PROCEDURE:   Decompression median nerve with Neurolysis and decompression ulnar nerve left elbow left hand   SURGEON: Daryll Brod, M.D.   ASSISTANT: Leverne Humbles, [facility]   ANESTHESIA:  Regional with sedation and general   INTRAVENOUS FLUIDS:  Per anesthesia flow sheet.   ESTIMATED BLOOD LOSS:  Minimal.   COMPLICATIONS:  None.   SPECIMENS:  none   TOURNIQUET TIME:    Total Tourniquet Time Documented: Upper Arm (Left) - 48 minutes Total: Upper Arm (Left) - 48 minutes    DISPOSITION:  Stable to PACU.   INDICATIONS: Patient is a 55 year old female with a history of numbness and tingling bilateral hands she is post carpal tunnel releases bilaterally.  Nerve conductions reveal continue significant changes in the median and ulnar nerve left arm.  She is desirous proceeding to have read release of the carpal canal with decompression possible transposition of the ulnar nerve at the left elbow.  Pre-.  Postoperative course been discussed along with risks and complications.  She is aware that there is no guarantee to the surgery the possibility of infection recurrence injury to arteries nerves tendons incomplete relief symptoms and dystrophy.  Preoperative.  Patient seen the extremity marked by both patient and surgeon antibiotic given a supraclavicular block was carried out without difficulty under the direction the anesthesia department  OPERATIVE COURSE: She is brought the operating room placed in the supine position with the left arm free.  She was prepped with ChloraPrep a 3-minute dry time allowed and a timeout taken to confirm patient  procedure.  A general anesthetic was also given.  The limb was exsanguinated with an Esmarch bandage turn placed on the arm was inflated to 250 mmHg.  A longitudinal incision was made in the left palm carried to the ulnar side and then along the distal forearm.  This carried down through subcutaneous tissue.  Bleeders were electrocauterized with bipolar.  The median nerve was identified proximally and urgent tissue.  The fascia was then released going from proximal to distal direction.  Significant scarring was present about the nerve and the overlying retinaculum and underlying tenosynovial tissue.  With careful blunt and sharp dissection the median nerve was entirely released over its entire course.  Compression was apparent.  Motor branch entered into muscle distally.  An epineural lysis of the nerve was then performed with the significant scarring about it.  The wrist was placed full range of motion the nerve was able to freely move.  Wound was copiously irrigated with saline.  Skin was closed interrupted 4-0 nylon sutures.  The ulnar nerve at the elbow was then addressed.  A longitudinal incision was made just posterior to the medial epicondyle carried down through subcutaneous tissue.  Bleeders were electrocauterized with bipolar.  Vision was approximately 4 cm in length.  The dissection was carried down opening Osborne's fascia on its posterior aspect.  The nerve was identified.  This was then followed proximally.  A blunt edges were then used to dissect the overlying skin and subcutaneous tissue away from the brachial fascia.  A knee retractor was placed proximally a K MI guide for carpal tunnel release was  then inserted between the ulnar nerve proximally and the fascia.  Angled ENT scissors straight nature were then used to release the fascia proximally for approximately 8 cm.  Attention was then directed distally.  The subcutaneous tissue and skin was then dissected free from the flexor carpi ulnaris  muscle.  The knee retractor was placed allowing visualization of the flexor carpi ulnaris and the superficial fascia was then released blunt scissors.  The muscle was then separated dorsal palmar branches.  The K MI guide was then inserted between the nerve and the deep fascia and the deep fascia was released using the angled ENT scissors for approximately 8 cm distally.  The elbow was fully flexed.  No subluxation to the nerve was present.  The wound was copiously irrigated with saline.  Osborne's fascia was then sutured to the posterior subcutaneous tissue in the posterior incision.  This was done with 2-0 Vicryl sutures.  The subcutaneous tissue was closed with interrupted 4-0 Vicryl and the skin with interrupted 4-0 nylon sutures.  A local infiltration of 9 cc of bupivacaine was given totally.  A sterile compressive dressing was applied to each wound.  Deflation of the tourniquet all fingers immediately pink.  She was taken to the recovery room for observation in satisfactory condition.  She will be discharged home to return to the [facility] in 1 week on Percocet.   Daryll Brod, MD Electronically signed, 06/06/21

## 2021-06-06 NOTE — Discharge Instructions (Addendum)

## 2021-06-06 NOTE — Progress Notes (Signed)
Assisted Dr. Jana Half with left, ultrasound guided, supraclavicular block. Side rails up, monitors on throughout procedure. See vital signs in flow sheet. Tolerated Procedure well.

## 2021-06-07 NOTE — Addendum Note (Signed)
Addendum  created 06/07/21 0857 by Willa Frater, CRNA   Charge Capture section accepted

## 2021-06-08 ENCOUNTER — Encounter (HOSPITAL_BASED_OUTPATIENT_CLINIC_OR_DEPARTMENT_OTHER): Payer: Self-pay | Admitting: Orthopedic Surgery

## 2021-06-08 ENCOUNTER — Ambulatory Visit: Payer: 59 | Admitting: Rheumatology

## 2021-06-11 MED FILL — Clopidogrel Bisulfate Tab 75 MG (Base Equiv): ORAL | 90 days supply | Qty: 90 | Fill #0 | Status: AC

## 2021-06-11 MED FILL — Folic Acid Tab 1 MG: ORAL | 90 days supply | Qty: 180 | Fill #0 | Status: AC

## 2021-06-11 MED FILL — Isosorbide Mononitrate Tab ER 24HR 60 MG: ORAL | 90 days supply | Qty: 90 | Fill #0 | Status: AC

## 2021-06-12 ENCOUNTER — Other Ambulatory Visit: Payer: Self-pay

## 2021-06-18 ENCOUNTER — Other Ambulatory Visit: Payer: Self-pay | Admitting: Gastroenterology

## 2021-06-19 ENCOUNTER — Other Ambulatory Visit: Payer: Self-pay | Admitting: Gastroenterology

## 2021-06-19 ENCOUNTER — Other Ambulatory Visit: Payer: Self-pay

## 2021-06-20 ENCOUNTER — Other Ambulatory Visit: Payer: Self-pay

## 2021-06-22 ENCOUNTER — Other Ambulatory Visit: Payer: Self-pay

## 2021-06-23 ENCOUNTER — Other Ambulatory Visit: Payer: Self-pay

## 2021-06-25 DIAGNOSIS — G4733 Obstructive sleep apnea (adult) (pediatric): Secondary | ICD-10-CM | POA: Diagnosis not present

## 2021-06-27 ENCOUNTER — Other Ambulatory Visit (HOSPITAL_COMMUNITY): Payer: Self-pay

## 2021-06-27 ENCOUNTER — Encounter: Payer: Self-pay | Admitting: Internal Medicine

## 2021-06-27 ENCOUNTER — Other Ambulatory Visit: Payer: Self-pay | Admitting: Gastroenterology

## 2021-06-27 ENCOUNTER — Other Ambulatory Visit: Payer: Self-pay

## 2021-06-28 ENCOUNTER — Other Ambulatory Visit: Payer: Self-pay

## 2021-06-29 ENCOUNTER — Other Ambulatory Visit (HOSPITAL_COMMUNITY): Payer: Self-pay

## 2021-06-29 ENCOUNTER — Other Ambulatory Visit: Payer: Self-pay | Admitting: Internal Medicine

## 2021-06-29 DIAGNOSIS — B3731 Acute candidiasis of vulva and vagina: Secondary | ICD-10-CM

## 2021-06-29 MED ORDER — FLUCONAZOLE 150 MG PO TABS
150.0000 mg | ORAL_TABLET | Freq: Once | ORAL | 0 refills | Status: AC
  Filled 2021-06-29: qty 2, 3d supply, fill #0

## 2021-06-29 NOTE — Telephone Encounter (Signed)
Please advise 

## 2021-06-30 ENCOUNTER — Other Ambulatory Visit: Payer: Self-pay

## 2021-07-04 ENCOUNTER — Other Ambulatory Visit (HOSPITAL_COMMUNITY): Payer: Self-pay

## 2021-07-04 ENCOUNTER — Other Ambulatory Visit: Payer: Self-pay

## 2021-07-04 ENCOUNTER — Other Ambulatory Visit: Payer: Self-pay | Admitting: Gastroenterology

## 2021-07-04 MED FILL — Glucose Blood Test Strip: 30 days supply | Qty: 300 | Fill #0 | Status: AC

## 2021-07-05 ENCOUNTER — Other Ambulatory Visit: Payer: Self-pay

## 2021-07-06 ENCOUNTER — Other Ambulatory Visit: Payer: Self-pay

## 2021-07-06 MED ORDER — LINACLOTIDE 290 MCG PO CAPS
290.0000 ug | ORAL_CAPSULE | Freq: Every day | ORAL | 6 refills | Status: DC
  Filled 2021-07-06: qty 30, 30d supply, fill #0
  Filled 2021-08-05: qty 30, 30d supply, fill #1

## 2021-07-07 ENCOUNTER — Other Ambulatory Visit: Payer: Self-pay

## 2021-07-10 ENCOUNTER — Other Ambulatory Visit (HOSPITAL_COMMUNITY): Payer: Self-pay

## 2021-07-14 DIAGNOSIS — Z79899 Other long term (current) drug therapy: Secondary | ICD-10-CM

## 2021-07-14 DIAGNOSIS — R609 Edema, unspecified: Secondary | ICD-10-CM

## 2021-07-18 ENCOUNTER — Other Ambulatory Visit: Payer: Self-pay

## 2021-07-18 MED ORDER — TORSEMIDE 20 MG PO TABS
20.0000 mg | ORAL_TABLET | Freq: Every day | ORAL | 1 refills | Status: DC
  Filled 2021-07-18: qty 90, 90d supply, fill #0
  Filled 2021-10-18: qty 90, 90d supply, fill #1

## 2021-07-19 ENCOUNTER — Other Ambulatory Visit: Payer: Self-pay

## 2021-07-20 ENCOUNTER — Other Ambulatory Visit: Payer: Self-pay

## 2021-07-22 ENCOUNTER — Other Ambulatory Visit: Payer: Self-pay | Admitting: Internal Medicine

## 2021-07-22 ENCOUNTER — Other Ambulatory Visit: Payer: Self-pay

## 2021-07-24 ENCOUNTER — Other Ambulatory Visit: Payer: Self-pay

## 2021-07-25 ENCOUNTER — Other Ambulatory Visit: Payer: Self-pay

## 2021-07-25 DIAGNOSIS — G4733 Obstructive sleep apnea (adult) (pediatric): Secondary | ICD-10-CM | POA: Diagnosis not present

## 2021-07-25 MED FILL — Cyclobenzaprine HCl Tab 10 MG: ORAL | 30 days supply | Qty: 30 | Fill #0 | Status: AC

## 2021-07-26 ENCOUNTER — Other Ambulatory Visit: Payer: Self-pay

## 2021-07-26 ENCOUNTER — Other Ambulatory Visit: Payer: Self-pay | Admitting: Physician Assistant

## 2021-07-26 ENCOUNTER — Other Ambulatory Visit (HOSPITAL_COMMUNITY): Payer: Self-pay

## 2021-07-26 DIAGNOSIS — E109 Type 1 diabetes mellitus without complications: Secondary | ICD-10-CM | POA: Diagnosis not present

## 2021-07-26 DIAGNOSIS — M0609 Rheumatoid arthritis without rheumatoid factor, multiple sites: Secondary | ICD-10-CM

## 2021-07-26 DIAGNOSIS — E1039 Type 1 diabetes mellitus with other diabetic ophthalmic complication: Secondary | ICD-10-CM | POA: Diagnosis not present

## 2021-07-26 DIAGNOSIS — E10319 Type 1 diabetes mellitus with unspecified diabetic retinopathy without macular edema: Secondary | ICD-10-CM | POA: Diagnosis not present

## 2021-07-26 DIAGNOSIS — Z79899 Other long term (current) drug therapy: Secondary | ICD-10-CM | POA: Diagnosis not present

## 2021-07-26 DIAGNOSIS — E1065 Type 1 diabetes mellitus with hyperglycemia: Secondary | ICD-10-CM | POA: Diagnosis not present

## 2021-07-26 DIAGNOSIS — G4733 Obstructive sleep apnea (adult) (pediatric): Secondary | ICD-10-CM | POA: Diagnosis not present

## 2021-07-26 LAB — CBC WITH DIFFERENTIAL/PLATELET
Absolute Monocytes: 468 cells/uL (ref 200–950)
Basophils Absolute: 21 cells/uL (ref 0–200)
Basophils Relative: 0.4 %
Eosinophils Absolute: 99 cells/uL (ref 15–500)
Eosinophils Relative: 1.9 %
HCT: 35.5 % (ref 35.0–45.0)
Hemoglobin: 12.1 g/dL (ref 11.7–15.5)
Lymphs Abs: 2111 cells/uL (ref 850–3900)
MCH: 29.7 pg (ref 27.0–33.0)
MCHC: 34.1 g/dL (ref 32.0–36.0)
MCV: 87 fL (ref 80.0–100.0)
MPV: 9.4 fL (ref 7.5–12.5)
Monocytes Relative: 9 %
Neutro Abs: 2501 cells/uL (ref 1500–7800)
Neutrophils Relative %: 48.1 %
Platelets: 237 10*3/uL (ref 140–400)
RBC: 4.08 10*6/uL (ref 3.80–5.10)
RDW: 13 % (ref 11.0–15.0)
Total Lymphocyte: 40.6 %
WBC: 5.2 10*3/uL (ref 3.8–10.8)

## 2021-07-26 LAB — COMPLETE METABOLIC PANEL WITH GFR
AG Ratio: 2 (calc) (ref 1.0–2.5)
ALT: 20 U/L (ref 6–29)
AST: 29 U/L (ref 10–35)
Albumin: 4.3 g/dL (ref 3.6–5.1)
Alkaline phosphatase (APISO): 91 U/L (ref 37–153)
BUN: 15 mg/dL (ref 7–25)
CO2: 29 mmol/L (ref 20–32)
Calcium: 8.8 mg/dL (ref 8.6–10.4)
Chloride: 98 mmol/L (ref 98–110)
Creat: 1 mg/dL (ref 0.50–1.03)
Globulin: 2.2 g/dL (calc) (ref 1.9–3.7)
Glucose, Bld: 155 mg/dL — ABNORMAL HIGH (ref 65–99)
Potassium: 3.9 mmol/L (ref 3.5–5.3)
Sodium: 136 mmol/L (ref 135–146)
Total Bilirubin: 0.4 mg/dL (ref 0.2–1.2)
Total Protein: 6.5 g/dL (ref 6.1–8.1)
eGFR: 67 mL/min/{1.73_m2} (ref >=60)

## 2021-07-26 MED ORDER — RINVOQ 15 MG PO TB24
ORAL_TABLET | ORAL | 2 refills | Status: DC
  Filled 2021-07-26: qty 30, 30d supply, fill #0

## 2021-07-26 NOTE — Telephone Encounter (Signed)
Next Visit: 09/14/2021   Last Visit: 04/07/2021   Last Fill: 04/27/2021  DX: Rheumatoid arthritis of multiple sites with negative rheumatoid factor    Current Dose per office note 04/07/2021,Rinvoq 15 mg 1 tablet by mouth once daily  Labs: 04/07/2021, Glucose is 112. Rest of CMP WNL.  CBC WNL.   TB Gold: 10/10/2020 Neg    Patient advised she is due to update labs. Patient will come today to update labs.   Okay to refill Rinvoq?

## 2021-07-27 NOTE — Progress Notes (Signed)
Glucose is 155.  Rest of CMP WNL. CBC WNL.

## 2021-07-31 ENCOUNTER — Other Ambulatory Visit (HOSPITAL_COMMUNITY): Payer: Self-pay

## 2021-07-31 ENCOUNTER — Other Ambulatory Visit: Payer: Self-pay | Admitting: Pharmacist

## 2021-07-31 DIAGNOSIS — M0609 Rheumatoid arthritis without rheumatoid factor, multiple sites: Secondary | ICD-10-CM

## 2021-07-31 MED ORDER — RINVOQ 15 MG PO TB24
15.0000 mg | ORAL_TABLET | Freq: Every day | ORAL | 1 refills | Status: DC
  Filled 2021-07-31: qty 30, fill #0
  Filled 2021-08-31: qty 30, 30d supply, fill #0
  Filled 2021-09-25: qty 30, 30d supply, fill #1

## 2021-08-01 ENCOUNTER — Telehealth: Payer: Self-pay

## 2021-08-01 NOTE — Telephone Encounter (Signed)
Called and lvm for pt to bring cpap machine and power cord for tomorrows appt (08/02/21) for [place] to do a manual data download.

## 2021-08-02 ENCOUNTER — Ambulatory Visit: Payer: 59 | Admitting: Neurology

## 2021-08-02 ENCOUNTER — Encounter: Payer: Self-pay | Admitting: Neurology

## 2021-08-02 ENCOUNTER — Other Ambulatory Visit: Payer: Self-pay

## 2021-08-02 ENCOUNTER — Other Ambulatory Visit: Payer: 59

## 2021-08-02 VITALS — BP 120/73 | HR 81 | Ht 63.0 in | Wt 183.0 lb

## 2021-08-02 DIAGNOSIS — Z951 Presence of aortocoronary bypass graft: Secondary | ICD-10-CM | POA: Diagnosis not present

## 2021-08-02 DIAGNOSIS — Z9989 Dependence on other enabling machines and devices: Secondary | ICD-10-CM

## 2021-08-02 DIAGNOSIS — G4733 Obstructive sleep apnea (adult) (pediatric): Secondary | ICD-10-CM | POA: Diagnosis not present

## 2021-08-02 DIAGNOSIS — G43011 Migraine without aura, intractable, with status migrainosus: Secondary | ICD-10-CM

## 2021-08-02 DIAGNOSIS — I480 Paroxysmal atrial fibrillation: Secondary | ICD-10-CM | POA: Diagnosis not present

## 2021-08-02 MED ORDER — UBRELVY 100 MG PO TABS
ORAL_TABLET | ORAL | 0 refills | Status: DC
  Filled 2021-08-02: qty 12, 12d supply, fill #0
  Filled 2021-08-04: qty 10, 30d supply, fill #0

## 2021-08-02 MED ORDER — UBRELVY 100 MG PO TABS: 100.0000 mg | ORAL_TABLET | ORAL | 0 refills | Status: DC | PRN

## 2021-08-02 NOTE — Patient Instructions (Signed)

## 2021-08-02 NOTE — Progress Notes (Signed)
SLEEP MEDICINE CLINIC    Provider:  Larey Seat, MD  Primary Care Physician:  McLean-Scocuzza, Nino Glow, MD [place] [address]     Referring Provider:  Dr Marcial Pacas, MD          Chief Complaint according to patient   Patient presents with:     New Patient (Initial Visit)           HISTORY OF PRESENT ILLNESS:  [name] is a 55 - year old  Caucasian female patient and seen on 08/02/2021 after HST and  a sleep consultation, based on a referral for migraine and possible sleep apnea as a cause. Dr Krista Blue.   Mrs. Myrtle reports that she was not surprised to find that she had sleep apnea her home sleep test dated from 17 February 2021 confirmed 8 hours of recorded sleep 6 hours 41 minutes were to sleep time by the algorithm provided and 35% of the sleep during REM sleep.  Her AHI was high 31.6/h and there was a almost double as high number in rem sleep.  Oxygenation or hypoxia was not prevalent.  We recommended CPAP therapy for the REM sleep depending on severity of apnea and an auto titration device was ordered this was to be set between 5 and 17 cmH2O pressure was 2 cm EPR heated humidification and with a mask of patient's choice.  She began using the autotitrator finally in May due to supply chain restraints we have been difficult to have her difficulties to give our patients the necessary CPAP machines on these CPAP machines do not have the Wi-Fi modem that they require an ST card to give [place] therapeutic data.  The patient has not used the machine 24 out of 30 days with an average of 4.4 hours daily use.  However there were 10 days where she did not quite make the 4-hour mark.  Her residual AHI now is 3.1 which is a significant decrease in comparison to her baseline study her air leak is only 7.4 L/min that is very good and her residual AHI is do not contain central apneas which is also important.  So at the 95th percentile pressure stands 7.2 cmH2O so based on  the data I would not need to change any of her settings.  She reports however the Epworth Sleepiness Scale at 7 points at this time and that her migraine has not improved but worsened. He wakes still up with headaches, but is now woken by HA. No nausea. Blurred vision in AM, hearing - she reports phonophobia. Noise sensitivity. Some nights she went to bed with headaches. Increase in frequency.  She has failed Emgality. Has not had ubrelvy.  She is awaiting botox with dr Krista Blue, limited medical options due to CAD.        Marland Kitchen  Chief concern according to patient :    I have the pleasure of seeing [name] today, a right -handed caucasian female with a chronic sleep disorder, who  has a past medical history of ACUT MI ANTEROLAT WALL SUBSQT EPIS CARE, Acute maxillary sinusitis, ALLERGIC RHINITIS, SEASONAL, ARTHRITIS, RHEUMATOID,  Atrial fibrillation ([place]), SVT, Open heart surgery for BYPASS, triple vessel in 2010.  Back pain, Bruxism (teeth grinding), CAD, ARTERY BYPASS GRAFT, CARPAL TUNNEL SYNDROME, BILATERAL, CHOLELITHIASIS, Contrast media allergy, DERMATITIS, ALLERGIC, DIABETES MELLITUS, TYPE I, Insulin pump, DIABETIC  RETINOPATHY, Hiatal hernia, HYPERLIPIDEMIA-MIXED, HYPERTHYROIDISM, MIGRAINE W/O AURA W/INTRACT W/STATUS MIGRAINOSUS (02/19/2008), Psoriasis, SINUS TACHYCARDIA (  11/08/2010), SVT (supraventricular tachycardia) ([place]), TRIGGER FINGER, and URI.   Sleep relevant medical history: Nocturia and sleep fragmentation, pain at night, neuropathy,  cervical spine injury, sinusitis .  Family medical /sleep history: mother on CPAP with OSA, father is a loud snorer, but [name] with insomnia.  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  and lives in a household with twin children- 34 years old. Family status is divorced.  The patient currently works day shift due to DM 1/ used to work in late shifts.  6 cats are present. Tobacco use -never .  ETOH use ; rare ,  Caffeine intake in form of  Coffee(/) Soda( diet coke). Regular exercise ; none -       Sleep habits are as follows: The patient's dinner time is between 5-6 PM. The patient goes to bed at 9 PM and continues to sleep for intervals of 1-2 hours, wakes for frequent bathroom breaks and is woken by the insulin pump. She reportedly snores. The bedroom is cool, quiet and dark.  The preferred sleep position is prone , without the support of any pillows.Dreams are reportedly rare. 5.20 AM is the usual rise time. The patient wakes up with an alarm.  She reports not feeling refreshed or restored in AM, with symptoms such as dry mouth , morning headaches.. Naps are taken infrequently, those interfere with nocturnal sleep and are less refreshing than nocturnal sleep.    Review of Systems: Out of a complete 14 system review, the patient complains of only the following symptoms, and all other reviewed systems are negative.:  Fatigue, sleepiness , snoring, fragmented sleep, Insomnia   Phonophobia.  Rheumatoid arthritis.    How likely are you to doze in the following situations: 0 = not likely, 1 = slight chance, 2 = moderate chance, 3 = high chance   Sitting and Reading? Watching Television? Sitting inactive in a public place (theater or meeting)? As a passenger in a car for an hour without a break? Lying down in the afternoon when circumstances permit? Sitting and talking to someone? Sitting quietly after lunch without alcohol? In a car, while stopped for a few minutes in traffic?   Total =  down to 7 from 10/ 24 points   FSS endorsed at 42/ 63 points. FSS at 92  GDS- no depression endorsed   Social History   Socioeconomic History   Marital status: Divorced    Spouse name: Not on file   Number of children: Not on file   Years of education: Not on file   Highest education level: Not on file  Occupational History   Not on file  Tobacco Use   Smoking status: Never   Smokeless tobacco: Never  Vaping Use   Vaping  Use: Never used  Substance and Sexual Activity   Alcohol use: Yes    Comment: rarely 1 every 6 months   Drug use: No   Sexual activity: Not Currently    Partners: Male    Birth control/protection: None  Other Topics Concern   Not on file  Social History Narrative   Divorced. Has 2 kids(fraternal twins) daughters age 68. Works at [employer], Never smoked, denies ETOH, no drugs. Drinks diet coke. No exercise.       DPR daughters [name] and Nalaya Pettaway twins          Social Determinants of Health   Financial Resource Strain: Not on file  Food Insecurity: Not on  file  Transportation Needs: Not on file  Physical Activity: Not on file  Stress: Not on file  Social Connections: Not on file    Family History  Problem Relation Age of Onset   Depression Mother    Anxiety disorder Mother    Colon polyps Father    Diabetes Father        2   Hypertension Father    Parkinson's disease Father    Diabetes Other    Heart disease Other    Hypertension Other    Hyperlipidemia Other    Depression Other    Migraines Other    Stroke Paternal Grandmother    Heart attack Neg Hx    Colon cancer Neg Hx    Stomach cancer Neg Hx    Esophageal cancer Neg Hx     Past Medical History:  Diagnosis Date   ACUT MI ANTEROLAT WALL SUBSQT EPIS CARE    Acute maxillary sinusitis    ALLERGIC RHINITIS, SEASONAL    ARTHRITIS, RHEUMATOID    shoulders and hands Enbrel>leg swelling Dr. Estanislado Pandy   Atrial fibrillation ([place])    a. after CABG.   Back pain    Bruxism (teeth grinding)    CAD, ARTERY BYPASS GRAFT    a. DES to RCA in 2010 then LAD occlusion s/p CABG 3 06/07/2009 with LIMA to LAD, reverse SVG to D1, reverse SVG to distal RCA. b. Cath 05/08/2016 slightly hypodense region in the intermediate branch, however she had excellent flow, FFR was normal. Vein graft to PDA and the posterolateral branch is patent, patent LIMA to LAD, occluded SVG to diagonal.   CARPAL TUNNEL SYNDROME, BILATERAL     CHOLELITHIASIS    Contrast media allergy    DERMATITIS, ALLERGIC    DIABETES MELLITUS, TYPE I    on insulin pump dx'ed age 27 y.o    DIABETIC  RETINOPATHY    Hiatal hernia    HYPERLIPIDEMIA-MIXED    HYPERTHYROIDISM    Dr. Dollene Cleveland   Insulin pump in place    MIGRAINE W/O AURA W/INTRACT W/STATUS MIGRAINOSUS 02/19/2008   PONV (postoperative nausea and vomiting)    Psoriasis    SINUS TACHYCARDIA 11/08/2010   Sleep apnea    not using machine yet    SVT (supraventricular tachycardia) ([place])    after s/p CABG   TRIGGER FINGER    all fingers b/l hands    URI     Past Surgical History:  Procedure Laterality Date   ABDOMINAL HYSTERECTOMY     endometriomas b/l; total ? cervix removed; no h/o abnormal pap   Caesarean section     CARDIAC CATHETERIZATION N/A 05/08/2016   Procedure: Left Heart Cath and Cors/Grafts Angiography;  Surgeon: Burnell Blanks, MD;  Location: [facility];  Service: Cardiovascular;  Laterality: N/A;   CARPAL TUNNEL RELEASE     b/l    CARPAL TUNNEL RELEASE Left 06/06/2021   Procedure: CARPAL TUNNEL RELEASE;  Surgeon: Daryll Brod, MD;  Location: [facility];  Service: Orthopedics;  Laterality: Left;  AXILLARY BLOCK   CATARACT EXTRACTION, BILATERAL     CHOLECYSTECTOMY     COLONOSCOPY WITH PROPOFOL N/A 03/25/2020   Procedure: COLONOSCOPY WITH PROPOFOL;  Surgeon: Jonathon Bellows, MD;  [facility];  Service: Gastroenterology;  Laterality: N/A;   CORONARY ARTERY BYPASS GRAFT     ESOPHAGOGASTRODUODENOSCOPY (EGD) WITH PROPOFOL N/A 03/25/2020   Procedure: ESOPHAGOGASTRODUODENOSCOPY (EGD) WITH PROPOFOL;  Surgeon: Jonathon Bellows, MD;  Location: [facility] ENDOSCOPY;  Service:  Gastroenterology;  Laterality: N/A;   EYE SURGERY     laser x 2 for retinopathy    LEFT HEART CATH AND CORS/GRAFTS ANGIOGRAPHY N/A 02/22/2020   Procedure: LEFT HEART CATH AND CORS/GRAFTS ANGIOGRAPHY;  Surgeon: Burnell Blanks, MD;  Location: [place] [facility];   Service: Cardiovascular;  Laterality: N/A;   TRIGGER FINGER RELEASE     b/l fingers all    ULNAR NERVE TRANSPOSITION Left 06/06/2021   Procedure: ULNAR NERVE DECOMPRESSION;  Surgeon: Daryll Brod, MD;  Location: [place];  Service: Orthopedics;  Laterality: Left;  AXILLARY BLOCK   VITRECTOMY     b/l      Current Outpatient Medications on File Prior to Visit  Medication Sig Dispense Refill   aspirin EC 81 MG tablet Take 81 mg by mouth daily.     CHELATED MAGNESIUM PO Take 250 mg by mouth daily.     clopidogrel (PLAVIX) 75 MG tablet TAKE 1 TABLET BY MOUTH ONCE DAILY 90 tablet 3   cyclobenzaprine (FLEXERIL) 10 MG tablet TAKE 1 TABLET (10 MG TOTAL) BY MOUTH 3 (THREE) TIMES DAILY AS NEEDED FOR UP TO 14 DAYS FOR MUSCLE SPASMS. 30 tablet 0   cyclobenzaprine (FLEXERIL) 10 MG tablet TAKE ONE TABLET BY MOUTH AT BEDTIME. 30 tablet 2   EPINEPHrine 0.3 mg/0.3 mL IJ SOAJ injection Inject 0.3 mLs (0.3 mg total) into the muscle as needed for anaphylaxis. 1 Device prn   folic acid (FOLVITE) 1 MG tablet TAKE 2 TABLETS BY MOUTH DAILY. 180 tablet 1   glucose blood test strip USE TO CHECK BLOOD SUGAR 10 TIMES DAILY (Patient taking differently: USE TO CHECK BLOOD SUGAR 10 TIMES DAILY) 300 strip 5   insulin lispro (HUMALOG) 100 UNIT/ML injection Inject 80-90 Units into the skin continuous. FOR USE IN INSULIN PUMP. TOTAL DAILY INSULIN DOSE = UP TO 90 UNITS.     insulin lispro (HUMALOG) 100 UNIT/ML injection INJECT UP TO 90 UNITS DAILY IN INSULIN PUMP 30 mL 5   isosorbide mononitrate (IMDUR) 60 MG 24 hr tablet TAKE 1 TABLET BY MOUTH DAILY. 90 tablet 3   Krill Oil 500 MG CAPS Take 500 mg by mouth daily.     leflunomide (ARAVA) 20 MG tablet TAKE 1 TABLET BY MOUTH DAILY 90 tablet 0   linaclotide (LINZESS) 290 MCG CAPS capsule Take 1 capsule (290 mcg total) by mouth daily before breakfast. 30 capsule 6   losartan (COZAAR) 100 MG tablet TAKE 1 TABLET BY MOUTH DAILY 90 tablet 3   metoprolol tartrate  (LOPRESSOR) 50 MG tablet Take 0.5 tablets (25 mg total) by mouth 2 (two) times daily. 90 tablet 3   omeprazole (PRILOSEC) 40 MG capsule      ondansetron (ZOFRAN) 4 MG tablet TAKE 1 TABLET BY MOUTH EVERY 8 HOURS AS NEEDED. ALTERNATE WITH PHENERGHAN 40 tablet 0   ondansetron (ZOFRAN-ODT) 4 MG disintegrating tablet Take 4 mg by mouth every 8 (eight) hours as needed.     ONE TOUCH ULTRA TEST test strip   1   pantoprazole (PROTONIX) 40 MG tablet TAKE 1 TABLET BY MOUTH DAILY. 90 tablet 1   potassium chloride (KLOR-CON) 10 MEQ tablet Take 1 tablet (10 mEq total) by mouth daily. 90 tablet 3   promethazine (PHENERGAN) 25 MG tablet TAKE 1/2- 1 TABLETS (12.5-25 MG TOTAL) BY MOUTH 2 TIMES DAILY AS NEEDED FOR NAUSEA OR VOMITING. 60 tablet 2   rosuvastatin (CRESTOR) 20 MG tablet Take 1 tablet (20 mg total) by mouth daily.  90 tablet 2   torsemide (DEMADEX) 20 MG tablet Take 1 tablet (20 mg total) by mouth daily. 90 tablet 1   Upadacitinib ER (RINVOQ) 15 MG TB24 Take 1 tablet (15 mg) by mouth daily. 30 tablet 1   dicyclomine (BENTYL) 20 MG tablet Take 1 tablet (20 mg total) by mouth 3 (three) times daily as needed for spasms (abdominal pain). 270 tablet 0   No current facility-administered medications on file prior to visit.    Allergies  Allergen Reactions   Actemra [Tocilizumab]    Prochlorperazine Rash    Neuro problems per pt   Ramipril Swelling, Rash and Other (See Comments)   Shellfish-Derived Products Swelling    Shrimp    Atorvastatin Rash    Elevated LFT's   Compazine  [Prochlorperazine Edisylate] Other (See Comments)    Neurological reaction   Emgality [Galcanezumab-Gnlm] Hives and Swelling   Etanercept Swelling and Rash   Infliximab Rash   Iohexol     Iv contrast dye -rash all over   Orencia [Abatacept] Rash   Prochlorperazine Edisylate     unknown   Shellfish Allergy Swelling    Shrimp(Facial swelling)   Tofacitinib Rash and Other (See Comments)    Severe abdominal pain     [employer]]     [employer], memory issues, word finding issues   Tramadol     Nausea    Amiodarone Nausea Only   Rituximab Rash    Causes a rash    Physical exam:  Today's Vitals   08/02/21 1532  BP: 120/73  Pulse: 81  Weight: 183 lb (83 kg)  Height: '5\' 3"'$  (1.6 m)   Body mass index is 32.42 kg/m.   Wt Readings from Last 3 Encounters:  08/02/21 183 lb (83 kg)  06/06/21 183 lb 6.8 oz (83.2 kg)  04/07/21 183 lb 3.2 oz (83.1 kg)     Ht Readings from Last 3 Encounters:  08/02/21 '5\' 3"'$  (1.6 m)  06/06/21 '5\' 3"'$  (1.6 m)  04/07/21 '5\' 3"'$  (1.6 m)      General: The patient is awake, alert and appears not in acute distress. The patient is well groomed. Head: Normocephalic, atraumatic. Neck is supple. Mallampati 2 with elongated uvula. Pale ,  neck circumference:15.5  inches .  Nasal airflow is patent.  Retrognathia is not seen.  Dental status: broken tooth from bruxism. No TMJ clicks.  Cardiovascular:  Regular rate and cardiac rhythm by pulse,  without distended neck veins. Respiratory: Lungs are clear to auscultation.  Skin:  Without evidence of ankle edema, or rash. Trunk: The patient's posture is erect.   Neurologic exam : The patient is awake and alert, oriented to place and time.   Memory subjective described as intact.  Attention span & concentration ability appears normal.  Speech is fluent,  without  dysarthria, dysphonia or aphasia.  Mood and affect are appropriate.   Cranial nerves: no loss of smell or taste reported  Pupils are equal and briskly reactive to light. Funduscopic exam deferred. Bilateral vitrectomy.  Marland Kitchen  Extraocular movements in vertical and horizontal planes were intact and without nystagmus. No Diplopia.  Facial motor strength is symmetric and tongue and uvula move midline.  Neck ROM : rotation, tilt and flexion extension were normal for age and shoulder shrug was symmetrical.    Motor exam:  Symmetric bulk, tone and ROM.   Normal  tone without cog wheeling, symmetrically decreased  grip strength, ROM restriction from rheumatoid arthritis.  After spending a total time of 20 minutes face to face and additional time for physical and neurologic examination, review of laboratory studies,  personal review of imaging studies, reports and results of other testing and review of referral information / records as far as provided in visit, I have established the following assessments:  Mrs. Gora presents with 2 manifestations of autoimmune disease diabetes type 1 with insulin pump in place, and rheumatoid arthritis.  Both conditions have led to sleep fragmentation there is some discomfort and sometimes flank pain that wakes her up.   The insulin pump itself does require recalibration quite often and this can also lead to additional arousals from sleep.   She has been told that she snores but this seems not to be every night the case and she prefers to sleep in a prone position which makes muffled snoring.  Her BMI is elevated but she is not morbidly obese she is treated for hypertension and has bradycardia on medication.   Her headaches have presented as AM with phonophobia, no improvement on CPAP. Marland Kitchen    My Plan is to proceed with: Mrs. Meller has been moderately compliant with her CPAP use but this is mainly related to recent surgery and just briefly that she return back to work.  So her work and therefore sleep hours would be much more regular in the near future.  She has used the machine 24 out of 30 days 80% Current Outpatient Medications  Medication Sig Dispense Refill   aspirin EC 81 MG tablet Take 81 mg by mouth daily.     CHELATED MAGNESIUM PO Take 250 mg by mouth daily.     clopidogrel (PLAVIX) 75 MG tablet TAKE 1 TABLET BY MOUTH ONCE DAILY 90 tablet 3   cyclobenzaprine (FLEXERIL) 10 MG tablet TAKE 1 TABLET (10 MG TOTAL) BY MOUTH 3 (THREE) TIMES DAILY AS NEEDED FOR UP TO 14 DAYS FOR MUSCLE SPASMS. 30 tablet 0    cyclobenzaprine (FLEXERIL) 10 MG tablet TAKE ONE TABLET BY MOUTH AT BEDTIME. 30 tablet 2   EPINEPHrine 0.3 mg/0.3 mL IJ SOAJ injection Inject 0.3 mLs (0.3 mg total) into the muscle as needed for anaphylaxis. 1 Device prn   folic acid (FOLVITE) 1 MG tablet TAKE 2 TABLETS BY MOUTH DAILY. 180 tablet 1   glucose blood test strip USE TO CHECK BLOOD SUGAR 10 TIMES DAILY (Patient taking differently: USE TO CHECK BLOOD SUGAR 10 TIMES DAILY) 300 strip 5   insulin lispro (HUMALOG) 100 UNIT/ML injection Inject 80-90 Units into the skin continuous. FOR USE IN INSULIN PUMP. TOTAL DAILY INSULIN DOSE = UP TO 90 UNITS.     insulin lispro (HUMALOG) 100 UNIT/ML injection INJECT UP TO 90 UNITS DAILY IN INSULIN PUMP 30 mL 5   isosorbide mononitrate (IMDUR) 60 MG 24 hr tablet TAKE 1 TABLET BY MOUTH DAILY. 90 tablet 3   Krill Oil 500 MG CAPS Take 500 mg by mouth daily.     leflunomide (ARAVA) 20 MG tablet TAKE 1 TABLET BY MOUTH DAILY 90 tablet 0   linaclotide (LINZESS) 290 MCG CAPS capsule Take 1 capsule (290 mcg total) by mouth daily before breakfast. 30 capsule 6   losartan (COZAAR) 100 MG tablet TAKE 1 TABLET BY MOUTH DAILY 90 tablet 3   metoprolol tartrate (LOPRESSOR) 50 MG tablet Take 0.5 tablets (25 mg total) by mouth 2 (two) times daily. 90 tablet 3   omeprazole (PRILOSEC) 40 MG capsule      ondansetron (ZOFRAN) 4 MG  tablet TAKE 1 TABLET BY MOUTH EVERY 8 HOURS AS NEEDED. ALTERNATE WITH PHENERGHAN 40 tablet 0   ondansetron (ZOFRAN-ODT) 4 MG disintegrating tablet Take 4 mg by mouth every 8 (eight) hours as needed.     ONE TOUCH ULTRA TEST test strip   1   pantoprazole (PROTONIX) 40 MG tablet TAKE 1 TABLET BY MOUTH DAILY. 90 tablet 1   potassium chloride (KLOR-CON) 10 MEQ tablet Take 1 tablet (10 mEq total) by mouth daily. 90 tablet 3   promethazine (PHENERGAN) 25 MG tablet TAKE 1/2- 1 TABLETS (12.5-25 MG TOTAL) BY MOUTH 2 TIMES DAILY AS NEEDED FOR NAUSEA OR VOMITING. 60 tablet 2   rosuvastatin (CRESTOR) 20 MG  tablet Take 1 tablet (20 mg total) by mouth daily. 90 tablet 2   torsemide (DEMADEX) 20 MG tablet Take 1 tablet (20 mg total) by mouth daily. 90 tablet 1   Upadacitinib ER (RINVOQ) 15 MG TB24 Take 1 tablet (15 mg) by mouth daily. 30 tablet 1   dicyclomine (BENTYL) 20 MG tablet Take 1 tablet (20 mg total) by mouth 3 (three) times daily as needed for spasms (abdominal pain). 270 tablet 0   No current facility-administered medications for this visit.   Managed to use it every night over 4 hours.  She reports waking up with headaches migrainous headaches headaches that are associated with phonophobia now.  When I need to report to Dr. Thayer Headings that apparently the treatment of apnea which is successful as the AHI is reduced to 3.1/h has not influenced the headache frequency at least not positively.  So the patient has not been on Ubrelvy for acute migraine headache treatment and she is awaiting a possible Botox therapy trial.  I am sorry to report that her sleep apnea treatment did not improve her headaches also I would like to continue with her sleep apnea treatment based on those data.  She will see the sleep clinic again in 12 months.    She is using a small nasal mask. Headgear is reportedly too loose.    I would like to thank Dr Krista Blue  for allowing me to meet with and to take care of this pleasant patient.    CPAP follow up through our NP within 12-15 month.     Electronically signed by: Larey Seat, MD 08/02/2021 3:56 PM  [facility] Sleep Board certified by The [employer] of Sleep Medicine and Diplomate of the [employer] of Sleep Medicine. Board certified In Neurology through the [place], Fellow of the [employer] of Neurology. Medical Director of [employer].

## 2021-08-03 ENCOUNTER — Other Ambulatory Visit: Payer: Self-pay

## 2021-08-03 ENCOUNTER — Telehealth: Payer: Self-pay | Admitting: Neurology

## 2021-08-03 NOTE — Telephone Encounter (Signed)
PA submitted through CMM/medimpact. KEY: [id number] Awaiting questions.

## 2021-08-03 NOTE — Telephone Encounter (Signed)
PA approved The authorization is effective for a maximum of 6 fills from 08/03/2021 to 02/02/2022

## 2021-08-03 NOTE — Telephone Encounter (Signed)
PA questions submitted. Will wait to hear back

## 2021-08-04 ENCOUNTER — Other Ambulatory Visit: Payer: Self-pay

## 2021-08-05 ENCOUNTER — Other Ambulatory Visit: Payer: Self-pay | Admitting: Family Medicine

## 2021-08-05 ENCOUNTER — Other Ambulatory Visit: Payer: Self-pay | Admitting: Physician Assistant

## 2021-08-06 ENCOUNTER — Other Ambulatory Visit: Payer: Self-pay | Admitting: Physician Assistant

## 2021-08-06 ENCOUNTER — Other Ambulatory Visit: Payer: Self-pay | Admitting: Family Medicine

## 2021-08-06 ENCOUNTER — Other Ambulatory Visit: Payer: Self-pay

## 2021-08-07 ENCOUNTER — Other Ambulatory Visit: Payer: Self-pay

## 2021-08-07 MED ORDER — LINACLOTIDE 290 MCG PO CAPS
290.0000 ug | ORAL_CAPSULE | Freq: Every day | ORAL | 3 refills | Status: DC
  Filled 2021-08-07: qty 90, 90d supply, fill #0
  Filled 2021-11-02: qty 90, 90d supply, fill #1
  Filled 2022-01-27: qty 90, 90d supply, fill #2
  Filled 2022-05-02: qty 90, 90d supply, fill #3

## 2021-08-07 MED FILL — Pantoprazole Sodium EC Tab 40 MG (Base Equiv): ORAL | 90 days supply | Qty: 90 | Fill #0 | Status: AC

## 2021-08-07 MED FILL — Leflunomide Tab 20 MG: ORAL | 90 days supply | Qty: 90 | Fill #0 | Status: AC

## 2021-08-07 NOTE — Telephone Encounter (Signed)
Next Visit: 09/14/2021  Last Visit: 04/07/2021  Last Fill: 05/01/2021  DX: Rheumatoid arthritis of multiple sites with negative rheumatoid factor  Current Dose per office note 04/07/2021: Arava 20 mg 1 tablet by mouth daily.   Labs: 07/26/2021 Glucose is 155.  Rest of CMP WNL. CBC WNL.   Okay to refill Arava?

## 2021-08-10 ENCOUNTER — Encounter: Payer: Self-pay | Admitting: Internal Medicine

## 2021-08-21 ENCOUNTER — Other Ambulatory Visit (HOSPITAL_COMMUNITY): Payer: Self-pay

## 2021-08-22 DIAGNOSIS — E10319 Type 1 diabetes mellitus with unspecified diabetic retinopathy without macular edema: Secondary | ICD-10-CM | POA: Diagnosis not present

## 2021-08-22 DIAGNOSIS — E0500 Thyrotoxicosis with diffuse goiter without thyrotoxic crisis or storm: Secondary | ICD-10-CM | POA: Diagnosis not present

## 2021-08-22 LAB — HEMOGLOBIN A1C: Hemoglobin A1C: 6.7

## 2021-08-23 ENCOUNTER — Other Ambulatory Visit: Payer: Self-pay

## 2021-08-23 MED ORDER — OZEMPIC (0.25 OR 0.5 MG/DOSE) 2 MG/1.5ML ~~LOC~~ SOPN
PEN_INJECTOR | SUBCUTANEOUS | 5 refills | Status: DC
  Filled 2021-08-23: qty 4.5, 84d supply, fill #0
  Filled 2021-11-14: qty 1.5, 28d supply, fill #1
  Filled 2021-12-11: qty 1.5, 28d supply, fill #2
  Filled 2022-01-08: qty 1.5, 28d supply, fill #3

## 2021-08-23 NOTE — Progress Notes (Signed)
Adeli needs to use her CPAP more and longer - residual AHI was very low- good result, but compliance is poor.

## 2021-08-25 DIAGNOSIS — G4733 Obstructive sleep apnea (adult) (pediatric): Secondary | ICD-10-CM | POA: Diagnosis not present

## 2021-08-31 ENCOUNTER — Other Ambulatory Visit (HOSPITAL_COMMUNITY): Payer: Self-pay

## 2021-08-31 ENCOUNTER — Other Ambulatory Visit: Payer: Self-pay

## 2021-08-31 MED FILL — Cyclobenzaprine HCl Tab 10 MG: ORAL | 30 days supply | Qty: 30 | Fill #1 | Status: AC

## 2021-08-31 NOTE — Progress Notes (Signed)
Office Visit Note  Patient: [name]             Date of Birth: 1966/10/16           MRN: [id number]             PCP: McLean-Scocuzza, Nino Glow, MD Referring: Orland Mustard * Visit Date: 09/14/2021 Occupation: '@GUAROCC'$ @  Subjective:  Medication management   History of Present Illness: [name] is a 55 y.o. female with a history of rheumatoid arthritis and osteoarthritis.  She states she had underwent left carpal tunnel release and left ulnar release on June 06, 2021.  She had to stop her immunosuppressant medications 2 weeks prior and restarted 2 weeks later.  She did not experience a flare.  She states her joints are doing well without any increased joint pain or swelling.  Activities of Daily Living:  Patient reports morning stiffness for 1-1.5 hours.   Patient Reports nocturnal pain.  Difficulty dressing/grooming: Denies Difficulty climbing stairs: Denies Difficulty getting out of chair: Denies Difficulty using hands for taps, buttons, cutlery, and/or writing: Reports  Review of Systems  Constitutional:  Positive for fatigue.  HENT:  Negative for mouth sores, mouth dryness and nose dryness.   Eyes:  Positive for dryness. Negative for pain and itching.  Respiratory:  Negative for shortness of breath and difficulty breathing.   Cardiovascular:  Negative for chest pain and palpitations.  Gastrointestinal:  Positive for constipation. Negative for blood in stool and diarrhea.  Endocrine: Negative for increased urination.  Genitourinary:  Negative for difficulty urinating.  Musculoskeletal:  Positive for myalgias, morning stiffness, muscle tenderness and myalgias. Negative for joint pain, joint pain and joint swelling.  Skin:  Negative for color change, rash and redness.  Allergic/Immunologic: Negative for susceptible to infections.  Neurological:  Positive for numbness and headaches. Negative for dizziness, memory loss and weakness.  Hematological:  Positive for  bruising/bleeding tendency.  Psychiatric/Behavioral:  Negative for confusion.    PMFS History:  Patient Active Problem List   Diagnosis Date Noted   OSA on CPAP 08/02/2021   Abnormal MRI, lumbar spine 03/20/2021   Abnormal MRI, thoracic spine 03/20/2021   Chronic mid back pain 03/17/2021   Chronic midline low back pain with left-sided sciatica 03/17/2021   Type 1 diabetes mellitus with diabetic polyneuropathy ([place]) 02/17/2021   Severe obstructive sleep apnea-hypopnea syndrome 02/17/2021   Chronic diastolic CHF (congestive heart failure) ([place]) 12/19/2020   Chronic back pain 12/19/2020   Chronic abdominal pain [id number]   Acute diastolic heart failure ([place]) 12/09/2020   Diabetic retinopathy associated with type 1 diabetes mellitus ([place]) 09/27/2020   Diabetic retinopathy ([place]) 09/27/2020   Graves' disease 09/27/2020   Chronic coronary artery disease 09/27/2020   Hypertension associated with diabetes ([place]) 09/14/2020   Obesity (BMI 30-39.9) 09/14/2020   Annual physical exam 09/14/2020   Aortic atherosclerosis ([place]) 09/14/2020   Snoring 05/12/2020   Lung nodule 03/10/2020   Colitis 03/10/2020   Type 1 diabetes mellitus with proliferative retinopathy ([place]) 12/10/2019   Abnormal thyroid function test 12/10/2019   Bruxism (teeth grinding)    Chronic migraine 10/07/2019   Contusion of left hip 07/25/2019   H/O multiple allergies 03/10/2019   Hx of anaphylaxis 03/10/2019   Cough 06/26/2018   PND (post-nasal drip) 06/26/2018   Lumbar strain, initial encounter 04/10/2018   Thoracic myofascial strain 04/10/2018   Vitamin D deficiency 07/04/2017   B12 deficiency 07/04/2017   Abnormal CT scan 06/19/2017   Gastroesophageal reflux  disease 06/19/2017   Antiplatelet or antithrombotic long-term use 06/19/2017   Nasal congestion 03/15/2017   Graves disease 02/01/2017   Sleep difficulties 02/01/2017   No energy 02/01/2017   Osteopenia 02/01/2017   Bilateral hand pain 07/16/2016    Acquired trigger finger 07/16/2016   Hypertension, essential 05/08/2016   Foot pain, right 08/30/2014   Shoulder pain, right 08/30/2014   Chronic pain syndrome 01/15/2014   Multiple joint pain 01/15/2014   Lesion of lower eyelid 04/21/2013   Generalized constriction of visual field 03/31/2013   Neoplasm of uncertain behavior of skin of eyelid 03/31/2013   Posterior capsular opacification 03/31/2013   Cyst of left lower eyelid 03/30/2013   Pseudophakia of both eyes 03/30/2013   Prurigo nodularis 02/05/2013   Stasis dermatitis 02/05/2013   Psoriasis 01/21/2013   Trochanteric bursitis 11/10/2012   Achilles tendinitis 07/11/2012   Epiretinal membrane 06/17/2012   Status post cataract extraction 04/30/2012   Pes equinus, acquired 04/23/2012   Tendinitis of ankle 04/23/2012   Proliferative diabetic retinopathy of both eyes ([place]) 01/11/2012   Ongoing use of possibly toxic medication 12/05/2011   Hyperthyroidism 11/09/2010   SINUS TACHYCARDIA 11/08/2010   DERMATITIS, ALLERGIC 07/20/2010   EDEMA 07/12/2010   DIZZINESS 11/14/2009   ATRIAL FIBRILLATION 07/20/2009   Hx of CABG 06/07/2009   ANGINA, STABLE/EXERTIONAL 06/02/2009   Dyslipidemia, goal LDL below 70 04/26/2009   ACUT MI ANTEROLAT WALL SUBSQT EPIS CARE 04/26/2009   Chest pain 04/26/2009   DIABETIC  RETINOPATHY 04/25/2009   CARPAL TUNNEL SYNDROME, BILATERAL 04/25/2009   TRIGGER FINGER 04/25/2009   MIGRAINE W/O AURA W/INTRACT W/STATUS MIGRAINOSUS 02/19/2008   ALLERGIC RHINITIS, SEASONAL 10/16/2006   CHOLELITHIASIS 10/16/2006   Rheumatoid arthritis ([place]) 10/16/2006    Past Medical History:  Diagnosis Date   ACUT MI ANTEROLAT WALL SUBSQT EPIS CARE    Acute maxillary sinusitis    ALLERGIC RHINITIS, SEASONAL    ARTHRITIS, RHEUMATOID    shoulders and hands Enbrel>leg swelling Dr. Estanislado Pandy   Atrial fibrillation ([place])    a. after CABG.   Back pain    Bruxism (teeth grinding)    CAD, ARTERY BYPASS GRAFT    a. DES to RCA in  2010 then LAD occlusion s/p CABG 3 06/07/2009 with LIMA to LAD, reverse SVG to D1, reverse SVG to distal RCA. b. Cath 05/08/2016 slightly hypodense region in the intermediate branch, however she had excellent flow, FFR was normal. Vein graft to PDA and the posterolateral branch is patent, patent LIMA to LAD, occluded SVG to diagonal.   CARPAL TUNNEL SYNDROME, BILATERAL    CHOLELITHIASIS    Contrast media allergy    DERMATITIS, ALLERGIC    DIABETES MELLITUS, TYPE I    on insulin pump dx'ed age 72 y.o    DIABETIC  RETINOPATHY    Hiatal hernia    HYPERLIPIDEMIA-MIXED    HYPERTHYROIDISM    Dr. Dollene Cleveland   Insulin pump in place    MIGRAINE W/O AURA W/INTRACT W/STATUS MIGRAINOSUS 02/19/2008   PONV (postoperative nausea and vomiting)    Psoriasis    SINUS TACHYCARDIA 11/08/2010   Sleep apnea    not using machine yet    SVT (supraventricular tachycardia) ([place])    after s/p CABG   TRIGGER FINGER    all fingers b/l hands    URI     Family History  Problem Relation Age of Onset   Depression Mother    Anxiety disorder Mother    Colon polyps Father    Diabetes  Father        2   Hypertension Father    Parkinson's disease Father    Diabetes Other    Heart disease Other    Hypertension Other    Hyperlipidemia Other    Depression Other    Migraines Other    Stroke Paternal Grandmother    Heart attack Neg Hx    Colon cancer Neg Hx    Stomach cancer Neg Hx    Esophageal cancer Neg Hx    Past Surgical History:  Procedure Laterality Date   ABDOMINAL HYSTERECTOMY     endometriomas b/l; total ? cervix removed; no h/o abnormal pap   Caesarean section     CARDIAC CATHETERIZATION N/A 05/08/2016   Procedure: Left Heart Cath and Cors/Grafts Angiography;  Surgeon: Burnell Blanks, MD;  Location: [facility];  Service: Cardiovascular;  Laterality: N/A;   CARPAL TUNNEL RELEASE     b/l    CARPAL TUNNEL RELEASE Left 06/06/2021   Procedure: CARPAL TUNNEL RELEASE;  Surgeon: Daryll Brod, MD;  Location: [facility];  Service: Orthopedics;  Laterality: Left;  AXILLARY BLOCK   CATARACT EXTRACTION, BILATERAL     CHOLECYSTECTOMY     COLONOSCOPY WITH PROPOFOL N/A 03/25/2020   Procedure: COLONOSCOPY WITH PROPOFOL;  Surgeon: Jonathon Bellows, MD;  Location[facility];  Service: Gastroenterology;  Laterality: N/A;   CORONARY ARTERY BYPASS GRAFT     ESOPHAGOGASTRODUODENOSCOPY (EGD) WITH PROPOFOL N/A 03/25/2020   Procedure: ESOPHAGOGASTRODUODENOSCOPY (EGD) WITH PROPOFOL;  Surgeon: Jonathon Bellows, MD;  Location[facility] ENDOSCOPY;  Service: Gastroenterology;  Laterality: N/A;   EYE SURGERY     laser x 2 for retinopathy    LEFT HEART CATH AND CORS/GRAFTS ANGIOGRAPHY N/A 02/22/2020   Procedure: LEFT HEART CATH AND CORS/GRAFTS ANGIOGRAPHY;  Surgeon: Burnell Blanks, MD;  Location: [facility];  Service: Cardiovascular;  Laterality: N/A;   TRIGGER FINGER RELEASE     b/l fingers all    ULNAR NERVE TRANSPOSITION Left 06/06/2021   Procedure: ULNAR NERVE DECOMPRESSION;  Surgeon: Daryll Brod, MD;  Location: [facility];  Service: Orthopedics;  Laterality: Left;  AXILLARY BLOCK   VITRECTOMY     b/l    Social History   Social History Narrative   Divorced. Has 2 kids(fraternal twins) daughters age 26. Works at [employer], Never smoked, denies ETOH, no drugs. Drinks diet coke. No exercise.       DPR daughters [name] and Santoya Heming twins          Immunization History  Administered Date(s) Administered   Influenza, Quadrivalent, Recombinant, Inj, Pf 10/14/2013   Influenza, Seasonal, Injecte, Preservative Fre 09/19/2016   Influenza,inj,Quad PF,6+ Mos 09/30/2014, 09/03/2018   Influenza,inj,quad, With Preservative 09/23/2017   Influenza-Unspecified 09/30/2014, 09/30/2016, 09/03/2018, 09/10/2019, 09/28/2020   PFIZER(Purple Top)SARS-COV-2 Vaccination 12/28/2019, 01/18/2020   Pneumococcal Conjugate-13 07/16/2016   Pneumococcal Polysaccharide-23  01/01/2004, 10/15/2006, 01/04/2020   Td 10/01/2003   Tdap 01/09/2011, 06/25/2018   Zoster Recombinat (Shingrix) 09/14/2020, 12/15/2020     Objective: Vital Signs: BP 133/81 (BP Location: Left Arm, Patient Position: Sitting, Cuff Size: Normal)   Pulse 76   Ht '5\' 3"'$  (1.6 m)   Wt 175 lb 12.8 oz (79.7 kg)   BMI 31.14 kg/m    Physical Exam Vitals and nursing note reviewed.  Constitutional:      Appearance: She is well-developed.  HENT:     Head: Normocephalic and atraumatic.  Eyes:     Conjunctiva/sclera: Conjunctivae normal.  Cardiovascular:     Rate and Rhythm: Normal rate and regular rhythm.     Heart sounds: Normal heart sounds.  Pulmonary:     Effort: Pulmonary effort is normal.     Breath sounds: Normal breath sounds.  Abdominal:     General: Bowel sounds are normal.     Palpations: Abdomen is soft.  Musculoskeletal:     Cervical back: Normal range of motion.  Lymphadenopathy:     Cervical: No cervical adenopathy.  Skin:    General: Skin is warm and dry.     Capillary Refill: Capillary refill takes less than 2 seconds.  Neurological:     Mental Status: She is alert and oriented to person, place, and time.  Psychiatric:        Behavior: Behavior normal.     Musculoskeletal Exam: C-spine was in good range of motion.  She has stiffness range of motion of her shoulder joints.  She has contractures in her bilateral elbows without any synovitis.  She had some tenderness over the left elbow at the surgical site.  She had recent left carpal tunnel release and is scarring from that.  No synovitis was noted over MCPs.  PIP and DIP thickening was noted.  She had tenderness over right trochanteric bursa.  Knee joints are in good range of motion without any warmth swelling or effusion.  There was no tenderness over ankles or MTPs.  CDAI Exam: CDAI Score: 0.4  Patient Global: 2 mm; Provider Global: 2 mm Swollen: 0 ; Tender: 0  Joint Exam 09/14/2021   No joint exam has been  documented for this visit   There is currently no information documented on the homunculus. Go to the Rheumatology activity and complete the homunculus joint exam.  Investigation: No additional findings.  Imaging: No results found.  Recent Labs: Lab Results  Component Value Date   WBC 5.2 07/26/2021   HGB 12.1 07/26/2021   PLT 237 07/26/2021   NA 136 07/26/2021   K 3.9 07/26/2021   CL 98 07/26/2021   CO2 29 07/26/2021   GLUCOSE 155 (H) 07/26/2021   BUN 15 07/26/2021   CREATININE 1.00 07/26/2021   BILITOT 0.4 07/26/2021   ALKPHOS 65 08/28/2020   AST 29 07/26/2021   ALT 20 07/26/2021   PROT 6.5 07/26/2021   ALBUMIN 4.2 08/28/2020   CALCIUM 8.8 07/26/2021   GFRAA 86 04/07/2021   QFTBGOLDPLUS NEGATIVE 10/10/2020    Speciality Comments: PLQ Eye Exam: 01/06/2020 ABNORMAL @ [facility]. Patient advised to discontinue PLQ at office visit on 01/06/2020.  Prior Therapy: methotrexate/Xeljanz/Actemra (GI upset), Enbrel (injection site reaction) and Orencia/Remicade/Humira/Cimzia/Rituxan/Simponi/Simponi Aria (inadequate response).  Procedures:  No procedures performed Allergies: Actemra [tocilizumab], Prochlorperazine, Ramipril, Shellfish-derived products, Atorvastatin, Compazine  [prochlorperazine edisylate], Emgality [galcanezumab-gnlm], Etanercept, Infliximab, Iohexol, Orencia [abatacept], Prochlorperazine edisylate, Shellfish allergy, Tofacitinib, Trokendi xr [topiramate er], Tramadol, Amiodarone, and Rituximab   Assessment / Plan:     Visit Diagnoses: Rheumatoid arthritis of multiple sites with negative rheumatoid factor (HCC) - Methotrexate/Xeljanz/Actemra (GI upset), Enbrel (injection site reaction) and Orencia/Remicade/Humira/Cimzia/Rituxan/Simponi/Simponi Aria (inadequate response): She has been on the combination of Rinvoq and leflunomide which has been working well for her.  She stopped the medications for a month at the time of surgery 2 weeks prior and  2 weeks after the surgery in June but did not have a flare.  She had no synovitis on examination today.  We discussed tapering leflunomide once she is 2 years symptom free.  High risk medication use - Rinvoq 15 mg 1 tablet by mouth once daily and Arava 20 mg 1 tablet by mouth daily.  Labs obtained from July 26, 2021 were reviewed which were within normal limits.  We will continue to monitor labs every 3 months.  Updated information regarding realization was given.  She does not want to get COVID-19 booster as she had reaction to the previous booster.  She already received her Shingrix vaccine.  She was advised to stop her Rinvoq and leflunomide in case she develops an infection and restart after infection resolves.  Psoriasis-she had no active lesions.  Contracture of joint of both elbows - Unchanged.  She had recent left ulnar nerve release and left carpal tunnel release.  DDD (degenerative disc disease), thoracic-she denies any discomfort.  DDD (degenerative disc disease), lumbar - She had a MRI of the lumbar spine on 04/02/21: Mild canal stenosis at L3-L4 and L4-L5, slightly progressed.  She is doing well.  Trochanteric bursitis, right hip-she is having increased symptoms of right trochanteric bursitis.  Left trochanteric bursitis resolved.  IT band stretches were demonstrated and discussed.  Osteopenia of multiple sites - Most recent DEXA on 11/11/19: The BMD measured at Femur Neck Left is 0.901 g/cm2 with a T-score of -1.0-Normal.  History of vitamin D deficiency-vitamin D supplement was discussed.  Other insomnia-off and on.  Other fatigue-related to insomnia.  History of hypertension-blood pressure was normal today.  History of hyperlipidemia  History of coronary artery disease  History of diabetes mellitus  History of gastroesophageal reflux (GERD)  History of Graves' disease  Abnormal SPEP -she had an abnormal SPEP in 2020.  We will repeat SPEP with her next labs.  Plan:  Serum protein electrophoresis with reflex  History of migraine  Orders: Orders Placed This Encounter  Procedures   Serum protein electrophoresis with reflex    No orders of the defined types were placed in this encounter.    Follow-Up Instructions: Return in about 5 months (around 02/14/2022) for Rheumatoid arthritis.   Bo Merino, MD  Note - This record has been created using Editor, commissioning.  Chart creation errors have been sought, but may not always  have been located. Such creation errors do not reflect on  the standard of medical care.

## 2021-09-14 ENCOUNTER — Encounter: Payer: Self-pay | Admitting: Rheumatology

## 2021-09-14 ENCOUNTER — Ambulatory Visit: Payer: 59 | Admitting: Rheumatology

## 2021-09-14 ENCOUNTER — Other Ambulatory Visit: Payer: Self-pay

## 2021-09-14 VITALS — BP 133/81 | HR 76 | Ht 63.0 in | Wt 175.8 lb

## 2021-09-14 DIAGNOSIS — M5136 Other intervertebral disc degeneration, lumbar region: Secondary | ICD-10-CM | POA: Diagnosis not present

## 2021-09-14 DIAGNOSIS — M0609 Rheumatoid arthritis without rheumatoid factor, multiple sites: Secondary | ICD-10-CM | POA: Diagnosis not present

## 2021-09-14 DIAGNOSIS — Z8639 Personal history of other endocrine, nutritional and metabolic disease: Secondary | ICD-10-CM | POA: Diagnosis not present

## 2021-09-14 DIAGNOSIS — Z79899 Other long term (current) drug therapy: Secondary | ICD-10-CM | POA: Diagnosis not present

## 2021-09-14 DIAGNOSIS — G4709 Other insomnia: Secondary | ICD-10-CM

## 2021-09-14 DIAGNOSIS — M8589 Other specified disorders of bone density and structure, multiple sites: Secondary | ICD-10-CM | POA: Diagnosis not present

## 2021-09-14 DIAGNOSIS — M7061 Trochanteric bursitis, right hip: Secondary | ICD-10-CM | POA: Diagnosis not present

## 2021-09-14 DIAGNOSIS — Z8719 Personal history of other diseases of the digestive system: Secondary | ICD-10-CM

## 2021-09-14 DIAGNOSIS — M24521 Contracture, right elbow: Secondary | ICD-10-CM

## 2021-09-14 DIAGNOSIS — M7062 Trochanteric bursitis, left hip: Secondary | ICD-10-CM

## 2021-09-14 DIAGNOSIS — Z8669 Personal history of other diseases of the nervous system and sense organs: Secondary | ICD-10-CM

## 2021-09-14 DIAGNOSIS — Z8679 Personal history of other diseases of the circulatory system: Secondary | ICD-10-CM

## 2021-09-14 DIAGNOSIS — G5602 Carpal tunnel syndrome, left upper limb: Secondary | ICD-10-CM

## 2021-09-14 DIAGNOSIS — R778 Other specified abnormalities of plasma proteins: Secondary | ICD-10-CM

## 2021-09-14 DIAGNOSIS — M5134 Other intervertebral disc degeneration, thoracic region: Secondary | ICD-10-CM

## 2021-09-14 DIAGNOSIS — L409 Psoriasis, unspecified: Secondary | ICD-10-CM

## 2021-09-14 DIAGNOSIS — M24522 Contracture, left elbow: Secondary | ICD-10-CM

## 2021-09-14 DIAGNOSIS — R5383 Other fatigue: Secondary | ICD-10-CM

## 2021-09-14 NOTE — Patient Instructions (Signed)
Standing Labs We placed an order today for your standing lab work.   Please have your standing labs drawn in October and every 3 months  If possible, please have your labs drawn 2 weeks prior to your appointment so that the provider can discuss your results at your appointment.  Please note that you may see your imaging and lab results in [place] before we have reviewed them. We may be awaiting multiple results to interpret others before contacting you. Please allow our office up to 72 hours to thoroughly review all of the results before contacting the office for clarification of your results.  We have open lab daily: Monday through Thursday from 1:30-4:30 PM and Friday from 1:30-4:00 PM at the office of Dr. Bo Merino, [facility].   Please be advised, all patients with office appointments requiring lab work will take precedent over walk-in lab work.  If possible, please come for your lab work on Monday and Friday afternoons, as you may experience shorter wait times. The office is located at [address], [place], [place], [place] [address] No appointment is necessary.   Labs are drawn by [facility]. Please bring your co-pay at the time of your lab draw.  You may receive a bill from [facility] for your lab work.  If you wish to have your labs drawn at another location, please call the office 24 hours in advance to send orders.  If you have any questions regarding directions or hours of operation,  please call [phone].   As a reminder, please drink plenty of water prior to coming for your lab work. Thanks!   Vaccines You are taking a medication(s) that can suppress your immune system.  The following immunizations are recommended: Flu annually Covid-19  Td/Tdap (tetanus, diphtheria, pertussis) every 10 years Pneumonia (Prevnar 15 then Pneumovax 23 at least 1 year apart.  Alternatively, can take Prevnar 20 without needing additional dose) Shingrix: 2 doses from 4 weeks  to 6 months apart  Please check with your PCP to make sure you are up to date.   If you test POSITIVE for COVID19 and have MILD to MODERATE symptoms: First, call your PCP if you would like to receive COVID19 treatment AND Hold your medications during the infection and for at least 1 week after your symptoms have resolved: Injectable medication (Benlysta, Cimzia, Cosentyx, Enbrel, Humira, Orencia, Remicade, Simponi, Stelara, Taltz, Tremfya) Methotrexate Leflunomide (Arava) Azathioprine Mycophenolate (Cellcept) Roma Kayser, or Rinvoq Otezla If you take Actemra or Kevzara, you DO NOT need to hold these for COVID19 infection.  If you test POSITIVE for COVID19 and have NO symptoms: First, call your PCP if you would like to receive COVID19 treatment AND Hold your medications for at least 10 days after the day that you tested positive Injectable medication (Benlysta, Cimzia, Cosentyx, Enbrel, Humira, Orencia, Remicade, Simponi, Stelara, Taltz, Tremfya) Methotrexate Leflunomide (Arava) Azathioprine Mycophenolate (Cellcept) Roma Kayser, or Rinvoq Otezla If you take Actemra or Kevzara, you DO NOT need to hold these for COVID19 infection.  If you have signs or symptoms of an infection or start antibiotics: First, call your PCP for workup of your infection. Hold your medication through the infection, until you complete your antibiotics, and until symptoms resolve if you take the following: Injectable medication (Actemra, Benlysta, Cimzia, Cosentyx, Enbrel, Humira, Kevzara, Orencia, Remicade, Simponi, Stelara, Taltz, Tremfya) Methotrexate Leflunomide (Arava) Mycophenolate (Cellcept) Roma Kayser, or Rinvoq  Heart Disease Prevention   Your inflammatory disease increases your risk of heart disease which  includes heart attack, stroke, atrial fibrillation (irregular heartbeats), high blood pressure, heart failure and atherosclerosis (plaque in the arteries).  It is important to  reduce your risk by:   Keep blood pressure, cholesterol, and blood sugar at healthy levels   Smoking Cessation   Maintain a healthy weight  BMI 20-25   Eat a healthy diet  Plenty of fresh fruit, vegetables, and whole grains  Limit saturated fats, foods high in sodium, and added sugars  DASH and Mediterranean diet   Increase physical activity  Recommend moderate physically activity for 150 minutes per week/ 30 minutes a day for five days a week These can be broken up into three separate ten-minute sessions during the day.   Reduce Stress  Meditation, slow breathing exercises, yoga, coloring books  Dental visits twice a year

## 2021-09-18 ENCOUNTER — Other Ambulatory Visit (HOSPITAL_COMMUNITY): Payer: Self-pay

## 2021-09-22 ENCOUNTER — Other Ambulatory Visit: Payer: Self-pay

## 2021-09-22 MED FILL — Isosorbide Mononitrate Tab ER 24HR 60 MG: ORAL | 90 days supply | Qty: 90 | Fill #1 | Status: AC

## 2021-09-24 MED FILL — Clopidogrel Bisulfate Tab 75 MG (Base Equiv): ORAL | 90 days supply | Qty: 90 | Fill #1 | Status: AC

## 2021-09-24 MED FILL — Cyclobenzaprine HCl Tab 10 MG: ORAL | 30 days supply | Qty: 30 | Fill #2 | Status: AC

## 2021-09-25 ENCOUNTER — Other Ambulatory Visit (HOSPITAL_COMMUNITY): Payer: Self-pay

## 2021-09-25 ENCOUNTER — Other Ambulatory Visit: Payer: Self-pay

## 2021-09-28 ENCOUNTER — Other Ambulatory Visit (HOSPITAL_COMMUNITY): Payer: Self-pay

## 2021-10-02 ENCOUNTER — Other Ambulatory Visit: Payer: Self-pay

## 2021-10-03 ENCOUNTER — Other Ambulatory Visit (HOSPITAL_COMMUNITY): Payer: Self-pay

## 2021-10-03 DIAGNOSIS — G4733 Obstructive sleep apnea (adult) (pediatric): Secondary | ICD-10-CM | POA: Diagnosis not present

## 2021-10-05 DIAGNOSIS — Z9841 Cataract extraction status, right eye: Secondary | ICD-10-CM | POA: Diagnosis not present

## 2021-10-05 DIAGNOSIS — H40023 Open angle with borderline findings, high risk, bilateral: Secondary | ICD-10-CM | POA: Diagnosis not present

## 2021-10-05 DIAGNOSIS — E103553 Type 1 diabetes mellitus with stable proliferative diabetic retinopathy, bilateral: Secondary | ICD-10-CM | POA: Diagnosis not present

## 2021-10-05 DIAGNOSIS — H40053 Ocular hypertension, bilateral: Secondary | ICD-10-CM | POA: Diagnosis not present

## 2021-10-05 DIAGNOSIS — H5212 Myopia, left eye: Secondary | ICD-10-CM | POA: Diagnosis not present

## 2021-10-05 DIAGNOSIS — H52213 Irregular astigmatism, bilateral: Secondary | ICD-10-CM | POA: Diagnosis not present

## 2021-10-05 DIAGNOSIS — Z9842 Cataract extraction status, left eye: Secondary | ICD-10-CM | POA: Diagnosis not present

## 2021-10-05 LAB — HM DIABETES EYE EXAM

## 2021-10-13 ENCOUNTER — Ambulatory Visit: Payer: 59 | Admitting: Internal Medicine

## 2021-10-18 ENCOUNTER — Other Ambulatory Visit: Payer: Self-pay

## 2021-10-18 ENCOUNTER — Encounter: Payer: Self-pay | Admitting: Internal Medicine

## 2021-10-18 MED ORDER — INSULIN LISPRO 100 UNIT/ML IJ SOLN
INTRAMUSCULAR | 5 refills | Status: DC
  Filled 2021-10-18: qty 30, 30d supply, fill #0
  Filled 2021-12-20: qty 90, 90d supply, fill #1
  Filled 2022-02-22: qty 60, 60d supply, fill #2

## 2021-10-18 MED FILL — Glucose Blood Test Strip: 30 days supply | Qty: 300 | Fill #1 | Status: AC

## 2021-10-18 MED FILL — Losartan Potassium Tab 100 MG: ORAL | 90 days supply | Qty: 90 | Fill #0 | Status: AC

## 2021-10-19 ENCOUNTER — Other Ambulatory Visit: Payer: Self-pay

## 2021-10-19 DIAGNOSIS — L98412 Non-pressure chronic ulcer of buttock with fat layer exposed: Secondary | ICD-10-CM | POA: Diagnosis not present

## 2021-10-19 MED ORDER — ACYCLOVIR 400 MG PO TABS
ORAL_TABLET | ORAL | 1 refills | Status: DC
  Filled 2021-10-19: qty 40, 13d supply, fill #0

## 2021-10-19 MED ORDER — MUPIROCIN 2 % EX OINT
TOPICAL_OINTMENT | CUTANEOUS | 1 refills | Status: DC
  Filled 2021-10-19: qty 22, 7d supply, fill #0
  Filled 2022-01-28: qty 22, 7d supply, fill #1

## 2021-10-23 ENCOUNTER — Other Ambulatory Visit (HOSPITAL_COMMUNITY): Payer: Self-pay

## 2021-10-24 ENCOUNTER — Telehealth: Payer: Self-pay

## 2021-10-24 ENCOUNTER — Other Ambulatory Visit (HOSPITAL_COMMUNITY): Payer: Self-pay

## 2021-10-24 NOTE — Telephone Encounter (Signed)
Received notification from [employer] that patient's current PA is expiring. Submitted PA renewal via CMM.  Received notification from [facility] regarding a prior authorization for [facility]. Authorization has been APPROVED from 10/24/2021 to 10/23/2022.   Patient can continue to fill through [facility]: [phone]   Authorization # [id number] CMM Key: Cornelius Moras   [employer] and [employer] updated with new PA info.

## 2021-10-26 ENCOUNTER — Other Ambulatory Visit (HOSPITAL_COMMUNITY): Payer: Self-pay

## 2021-10-26 DIAGNOSIS — E1039 Type 1 diabetes mellitus with other diabetic ophthalmic complication: Secondary | ICD-10-CM | POA: Diagnosis not present

## 2021-10-26 DIAGNOSIS — E1065 Type 1 diabetes mellitus with hyperglycemia: Secondary | ICD-10-CM | POA: Diagnosis not present

## 2021-10-26 DIAGNOSIS — E109 Type 1 diabetes mellitus without complications: Secondary | ICD-10-CM | POA: Diagnosis not present

## 2021-10-26 DIAGNOSIS — E10319 Type 1 diabetes mellitus with unspecified diabetic retinopathy without macular edema: Secondary | ICD-10-CM | POA: Diagnosis not present

## 2021-10-27 ENCOUNTER — Other Ambulatory Visit: Payer: Self-pay | Admitting: Physician Assistant

## 2021-10-27 ENCOUNTER — Other Ambulatory Visit: Payer: Self-pay | Admitting: Pharmacist

## 2021-10-27 ENCOUNTER — Other Ambulatory Visit (HOSPITAL_COMMUNITY): Payer: Self-pay

## 2021-10-27 DIAGNOSIS — Z79899 Other long term (current) drug therapy: Secondary | ICD-10-CM

## 2021-10-27 DIAGNOSIS — Z111 Encounter for screening for respiratory tuberculosis: Secondary | ICD-10-CM

## 2021-10-27 DIAGNOSIS — Z9225 Personal history of immunosupression therapy: Secondary | ICD-10-CM

## 2021-10-27 DIAGNOSIS — M0609 Rheumatoid arthritis without rheumatoid factor, multiple sites: Secondary | ICD-10-CM

## 2021-10-27 MED ORDER — RINVOQ 15 MG PO TB24
15.0000 mg | ORAL_TABLET | Freq: Every day | ORAL | 1 refills | Status: DC
  Filled 2021-10-27: qty 30, 30d supply, fill #0
  Filled 2021-11-27: qty 30, 30d supply, fill #1

## 2021-10-27 MED ORDER — RINVOQ 15 MG PO TB24
15.0000 mg | ORAL_TABLET | Freq: Every day | ORAL | 1 refills | Status: DC
  Filled 2021-10-27: qty 30, 30d supply, fill #0

## 2021-10-27 NOTE — Telephone Encounter (Signed)
Next Visit: 02/20/2022  Last Visit: 09/14/2021  Last Fill: 07/31/2021  [id number] arthritis of multiple sites with negative rheumatoid factor   Current Dose per office note 09/14/2021: Rinvoq 15 mg 1 tablet by mouth once daily   Labs: 07/26/2021 Glucose is 155.  Rest of CMP WNL. CBC WNL.   TB Gold: 10/10/2020 Neg    Patient advised she is due to update labs and will come on Tuesday to update.   Okay to refill Rinvoq?

## 2021-10-28 ENCOUNTER — Other Ambulatory Visit (HOSPITAL_COMMUNITY): Payer: Self-pay

## 2021-10-30 ENCOUNTER — Other Ambulatory Visit (HOSPITAL_COMMUNITY): Payer: Self-pay

## 2021-10-31 ENCOUNTER — Other Ambulatory Visit: Payer: Self-pay | Admitting: *Deleted

## 2021-10-31 DIAGNOSIS — Z79899 Other long term (current) drug therapy: Secondary | ICD-10-CM

## 2021-10-31 DIAGNOSIS — Z9225 Personal history of immunosupression therapy: Secondary | ICD-10-CM | POA: Diagnosis not present

## 2021-10-31 DIAGNOSIS — Z111 Encounter for screening for respiratory tuberculosis: Secondary | ICD-10-CM | POA: Diagnosis not present

## 2021-10-31 DIAGNOSIS — R778 Other specified abnormalities of plasma proteins: Secondary | ICD-10-CM

## 2021-11-02 ENCOUNTER — Other Ambulatory Visit: Payer: Self-pay | Admitting: Family Medicine

## 2021-11-02 ENCOUNTER — Other Ambulatory Visit: Payer: Self-pay | Admitting: Cardiovascular Disease

## 2021-11-02 ENCOUNTER — Other Ambulatory Visit: Payer: Self-pay | Admitting: Internal Medicine

## 2021-11-02 NOTE — Progress Notes (Signed)
TB Gold is negative.

## 2021-11-03 ENCOUNTER — Telehealth: Payer: Self-pay

## 2021-11-03 ENCOUNTER — Other Ambulatory Visit: Payer: Self-pay | Admitting: Internal Medicine

## 2021-11-03 ENCOUNTER — Other Ambulatory Visit: Payer: Self-pay | Admitting: Cardiovascular Disease

## 2021-11-03 ENCOUNTER — Other Ambulatory Visit: Payer: Self-pay

## 2021-11-03 MED ORDER — PANTOPRAZOLE SODIUM 40 MG PO TBEC
DELAYED_RELEASE_TABLET | Freq: Every day | ORAL | 2 refills | Status: DC
  Filled 2021-11-03: qty 90, 90d supply, fill #0

## 2021-11-03 MED ORDER — CYCLOBENZAPRINE HCL 10 MG PO TABS
ORAL_TABLET | ORAL | 2 refills | Status: DC
  Filled 2021-11-03: qty 30, 30d supply, fill #0
  Filled 2021-11-29: qty 30, 30d supply, fill #1
  Filled 2022-01-07: qty 30, 30d supply, fill #2

## 2021-11-03 NOTE — Telephone Encounter (Signed)
Patient had a question about her SPEP being abnormal. Patient advised her IFE interpretation is still pending. Patient advised once it results Dr. Estanislado Pandy will review and give her recommendations. Patient advised we will call once she has reviewed.

## 2021-11-03 NOTE — Telephone Encounter (Signed)
Patient called stating she was able to review the results of her labwork last night and would like to discuss them with Dr. Estanislado Pandy or her nurse.  Patient states she will not be available to talk until 2:00 pm today.

## 2021-11-07 LAB — COMPLETE METABOLIC PANEL WITH GFR
AG Ratio: 2.2 (calc) (ref 1.0–2.5)
ALT: 18 U/L (ref 6–29)
AST: 23 U/L (ref 10–35)
Albumin: 4.7 g/dL (ref 3.6–5.1)
Alkaline phosphatase (APISO): 95 U/L (ref 37–153)
BUN: 15 mg/dL (ref 7–25)
CO2: 28 mmol/L (ref 20–32)
Calcium: 9.2 mg/dL (ref 8.6–10.4)
Chloride: 98 mmol/L (ref 98–110)
Creat: 1.03 mg/dL (ref 0.50–1.03)
Globulin: 2.1 g/dL (calc) (ref 1.9–3.7)
Glucose, Bld: 160 mg/dL — ABNORMAL HIGH (ref 65–99)
Potassium: 3.8 mmol/L (ref 3.5–5.3)
Sodium: 137 mmol/L (ref 135–146)
Total Bilirubin: 0.5 mg/dL (ref 0.2–1.2)
Total Protein: 6.8 g/dL (ref 6.1–8.1)
eGFR: 65 mL/min/{1.73_m2} (ref >=60)

## 2021-11-07 LAB — PROTEIN ELECTROPHORESIS, SERUM, WITH REFLEX
Albumin ELP: 4.3 g/dL (ref 3.8–4.8)
Alpha 1: 0.3 g/dL (ref 0.2–0.3)
Alpha 2: 0.9 g/dL (ref 0.5–0.9)
Beta 2: 0.3 g/dL (ref 0.2–0.5)
Beta Globulin: 0.4 g/dL (ref 0.4–0.6)
Gamma Globulin: 0.5 g/dL — ABNORMAL LOW (ref 0.8–1.7)
Total Protein: 6.7 g/dL (ref 6.1–8.1)

## 2021-11-07 LAB — QUANTIFERON-TB GOLD PLUS
Mitogen-NIL: 8.38 IU/mL
NIL: 0.03 IU/mL
QuantiFERON-TB Gold Plus: NEGATIVE
TB1-NIL: 0.01 IU/mL
TB2-NIL: 0.02 IU/mL

## 2021-11-07 LAB — CBC WITH DIFFERENTIAL/PLATELET
Absolute Monocytes: 385 cells/uL (ref 200–950)
Basophils Absolute: 31 cells/uL (ref 0–200)
Basophils Relative: 0.6 %
Eosinophils Absolute: 78 cells/uL (ref 15–500)
Eosinophils Relative: 1.5 %
HCT: 38.6 % (ref 35.0–45.0)
Hemoglobin: 13.2 g/dL (ref 11.7–15.5)
Lymphs Abs: 1986 cells/uL (ref 850–3900)
MCH: 30 pg (ref 27.0–33.0)
MCHC: 34.2 g/dL (ref 32.0–36.0)
MCV: 87.7 fL (ref 80.0–100.0)
MPV: 9.6 fL (ref 7.5–12.5)
Monocytes Relative: 7.4 %
Neutro Abs: 2720 cells/uL (ref 1500–7800)
Neutrophils Relative %: 52.3 %
Platelets: 251 10*3/uL (ref 140–400)
RBC: 4.4 10*6/uL (ref 3.80–5.10)
RDW: 13 % (ref 11.0–15.0)
Total Lymphocyte: 38.2 %
WBC: 5.2 10*3/uL (ref 3.8–10.8)

## 2021-11-07 LAB — IFE INTERPRETATION: Immunofix Electr Int: NOT DETECTED

## 2021-11-14 ENCOUNTER — Encounter: Payer: Self-pay | Admitting: Internal Medicine

## 2021-11-14 ENCOUNTER — Other Ambulatory Visit: Payer: Self-pay | Admitting: Physician Assistant

## 2021-11-14 ENCOUNTER — Ambulatory Visit: Payer: 59 | Admitting: Internal Medicine

## 2021-11-14 ENCOUNTER — Other Ambulatory Visit: Payer: Self-pay

## 2021-11-14 VITALS — BP 110/70 | HR 81 | Temp 97.0°F | Ht 63.0 in | Wt 169.0 lb

## 2021-11-14 DIAGNOSIS — J310 Chronic rhinitis: Secondary | ICD-10-CM | POA: Diagnosis not present

## 2021-11-14 DIAGNOSIS — E1042 Type 1 diabetes mellitus with diabetic polyneuropathy: Secondary | ICD-10-CM | POA: Diagnosis not present

## 2021-11-14 DIAGNOSIS — E1159 Type 2 diabetes mellitus with other circulatory complications: Secondary | ICD-10-CM | POA: Diagnosis not present

## 2021-11-14 DIAGNOSIS — Z0000 Encounter for general adult medical examination without abnormal findings: Secondary | ICD-10-CM

## 2021-11-14 DIAGNOSIS — Z1231 Encounter for screening mammogram for malignant neoplasm of breast: Secondary | ICD-10-CM | POA: Diagnosis not present

## 2021-11-14 DIAGNOSIS — E103599 Type 1 diabetes mellitus with proliferative diabetic retinopathy without macular edema, unspecified eye: Secondary | ICD-10-CM

## 2021-11-14 DIAGNOSIS — I152 Hypertension secondary to endocrine disorders: Secondary | ICD-10-CM

## 2021-11-14 DIAGNOSIS — Z87892 Personal history of anaphylaxis: Secondary | ICD-10-CM

## 2021-11-14 DIAGNOSIS — Z889 Allergy status to unspecified drugs, medicaments and biological substances status: Secondary | ICD-10-CM

## 2021-11-14 MED ORDER — IPRATROPIUM BROMIDE 0.06 % NA SOLN
2.0000 | Freq: Four times a day (QID) | NASAL | 12 refills | Status: DC | PRN
  Filled 2021-11-14: qty 15, 19d supply, fill #0

## 2021-11-14 MED ORDER — EPINEPHRINE 0.3 MG/0.3ML IJ SOAJ
0.3000 mg | INTRAMUSCULAR | 2 refills | Status: AC | PRN
  Filled 2021-11-14: qty 2, 2d supply, fill #0

## 2021-11-14 MED ORDER — LEFLUNOMIDE 20 MG PO TABS
ORAL_TABLET | Freq: Every day | ORAL | 0 refills | Status: DC
  Filled 2021-11-14: qty 90, 90d supply, fill #0

## 2021-11-14 NOTE — Patient Instructions (Addendum)
Consider xyzal at night and zyrtec daytime  Constipation, Adult Constipation is when a person has fewer than three bowel movements in a week, has difficulty having a bowel movement, or has stools (feces) that are dry, hard, or larger than normal. Constipation may be caused by an underlying condition. It may become worse with age if a person takes certain medicines and does not take in enough fluids. Follow these instructions at home: Eating and drinking  Eat foods that have a lot of fiber, such as beans, whole grains, and fresh fruits and vegetables. Limit foods that are low in fiber and high in fat and processed sugars, such as fried or sweet foods. These include french fries, hamburgers, cookies, candies, and soda. Drink enough fluid to keep your urine pale yellow. General instructions Exercise regularly or as told by your health care provider. Try to do 150 minutes of moderate exercise each week. Use the bathroom when you have the urge to go. Do not hold it in. Take over-the-counter and prescription medicines only as told by your health care provider. This includes any fiber supplements. During bowel movements: Practice deep breathing while relaxing the lower abdomen. Practice pelvic floor relaxation. Watch your condition for any changes. Let your health care provider know about them. Keep all follow-up visits as told by your health care provider. This is important. Contact a health care provider if: You have pain that gets worse. You have a fever. You do not have a bowel movement after 4 days. You vomit. You are not hungry or you lose weight. You are bleeding from the opening between the buttocks (anus). You have thin, pencil-like stools. Get help right away if: You have a fever and your symptoms suddenly get worse. You leak stool or have blood in your stool. Your abdomen is bloated. You have severe pain in your abdomen. You feel dizzy or you faint. Summary Constipation is when a  person has fewer than three bowel movements in a week, has difficulty having a bowel movement, or has stools (feces) that are dry, hard, or larger than normal. Eat foods that have a lot of fiber, such as beans, whole grains, and fresh fruits and vegetables. Drink enough fluid to keep your urine pale yellow. Take over-the-counter and prescription medicines only as told by your health care provider. This includes any fiber supplements. This information is not intended to replace advice given to you by your health care provider. Make sure you discuss any questions you have with your health care provider. Document Revised: 11/04/2019 Document Reviewed: 11/04/2019 Elsevier Patient Education  [place].

## 2021-11-14 NOTE — Telephone Encounter (Signed)
Next Visit: 02/20/2022  Last Visit: 09/14/2021   Last Fill: 08/07/2021   DX: Rheumatoid arthritis of multiple sites with negative rheumatoid factor   Current Dose per office note 09/14/2021: Arava 20 mg 1 tablet by mouth daily  Labs: 10/31/2021 CBC WNL. Glucose is 160. Rest of CMP WNL.   Okay to refill Arava?

## 2021-11-14 NOTE — Progress Notes (Signed)
Chief Complaint  Patient presents with   Follow-up   Annual  1. Anaphylaxis will need refill of epi pen  2. Mammogram due 01/11/22  3. Chronic back pain today 6/10 and generalized pain due to RA chronic back pain PRP helped in the past injection had 1 since last visit with Dr. Ace Gins and another in the past which last about 2 years for chronic back pain but PT has never helped  4. HTN with DM controlled on bolus insulin pump 90 units or less and ozempic 0.5 weekly lost wt and no side effects ozempic On losartan 100 mg qd, lopressor 25 mg bid, crestor 20 mg qhs, torsemide 20 mg  A1C 08/22/21 6.7  5. Thyroid d/o last tsh 1.17 and T4 1.21   Review of Systems  Constitutional:  Negative for weight loss.  HENT:  Negative for hearing loss.   Eyes:  Negative for blurred vision.  Respiratory:  Negative for shortness of breath.   Cardiovascular:  Negative for chest pain.  Gastrointestinal:  Negative for abdominal pain and blood in stool.  Genitourinary:  Negative for dysuria.  Musculoskeletal:  Negative for falls and joint pain.  Skin:  Negative for rash.  Neurological:  Negative for headaches.  Psychiatric/Behavioral:  Negative for depression.   Past Medical History:  Diagnosis Date   ACUT MI ANTEROLAT WALL SUBSQT EPIS CARE    Acute maxillary sinusitis    ALLERGIC RHINITIS, SEASONAL    ARTHRITIS, RHEUMATOID    shoulders and hands Enbrel>leg swelling Dr. Estanislado Pandy   Atrial fibrillation [facility])    a. after CABG.   Back pain    Bruxism (teeth grinding)    CAD, ARTERY BYPASS GRAFT    a. DES to RCA in 2010 then LAD occlusion s/p CABG 3 06/07/2009 with LIMA to LAD, reverse SVG to D1, reverse SVG to distal RCA. b. Cath 05/08/2016 slightly hypodense region in the intermediate branch, however she had excellent flow, FFR was normal. Vein graft to PDA and the posterolateral branch is patent, patent LIMA to LAD, occluded SVG to diagonal.   CARPAL TUNNEL SYNDROME, BILATERAL    CHOLELITHIASIS    Contrast  media allergy    DERMATITIS, ALLERGIC    DIABETES MELLITUS, TYPE I    on insulin pump dx'ed age 55 y.o    DIABETIC  RETINOPATHY    Hiatal hernia    HYPERLIPIDEMIA-MIXED    HYPERTHYROIDISM    Dr. Dollene Cleveland   Insulin pump in place    MIGRAINE W/O AURA W/INTRACT W/STATUS MIGRAINOSUS 02/19/2008   PONV (postoperative nausea and vomiting)    Psoriasis    SINUS TACHYCARDIA 11/08/2010   Sleep apnea    not using machine yet    SVT (supraventricular tachycardia) ([place])    after s/p CABG   TRIGGER FINGER    all fingers b/l hands    URI    Past Surgical History:  Procedure Laterality Date   ABDOMINAL HYSTERECTOMY     endometriomas b/l; total ? cervix removed; no h/o abnormal pap   Caesarean section     CARDIAC CATHETERIZATION N/A 05/08/2016   Procedure: Left Heart Cath and Cors/Grafts Angiography;  Surgeon: Burnell Blanks, MD;  Location: [place] [facility];  Service: Cardiovascular;  Laterality: N/A;   CARPAL TUNNEL RELEASE     b/l    CARPAL TUNNEL RELEASE Left 06/06/2021   Procedure: CARPAL TUNNEL RELEASE;  Surgeon: Daryll Brod, MD;  Location: [place];  Service: Orthopedics;  Laterality: Left;  AXILLARY  BLOCK   CATARACT EXTRACTION, BILATERAL     CHOLECYSTECTOMY     COLONOSCOPY WITH PROPOFOL N/A 03/25/2020   Procedure: COLONOSCOPY WITH PROPOFOL;  Surgeon: Jonathon Bellows, MD;  Location: [facility] ENDOSCOPY;  Service: Gastroenterology;  Laterality: N/A;   CORONARY ARTERY BYPASS GRAFT     ESOPHAGOGASTRODUODENOSCOPY (EGD) WITH PROPOFOL N/A 03/25/2020   Procedure: ESOPHAGOGASTRODUODENOSCOPY (EGD) WITH PROPOFOL;  Surgeon: Jonathon Bellows, MD;  [facility] END[facility]COPY;  Service: Gastroenterology;  Laterality: N/A;   EYE SURGERY     laser x 2 for retinopathy    LEFT HEART CATH AND CORS/GRAFTS ANGIOGRAPHY N/A 02/22/2020   Procedure: LEFT HEART CATH AND CORS/GRAFTS ANGIOGRAPHY;  Surgeon: Burnell Blanks, MD;  Location: [place] [facility];  Service: Cardiovascular;  Laterality:  N/A;   TRIGGER FINGER RELEASE     b/l fingers all    ULNAR NERVE TRANSPOSITION Left 06/06/2021   Procedure: ULNAR NERVE DECOMPRESSION;  Surgeon: Daryll Brod, MD;  Location: [place];  Service: Orthopedics;  Laterality: Left;  AXILLARY BLOCK   VITRECTOMY     b/l    Family History  Problem Relation Age of Onset   Depression Mother    Anxiety disorder Mother    Colon polyps Father    Diabetes Father        2   Hypertension Father    Parkinson's disease Father    Diabetes Other    Heart disease Other    Hypertension Other    Hyperlipidemia Other    Depression Other    Migraines Other    Stroke Paternal Grandmother    Heart attack Neg Hx    Colon cancer Neg Hx    Stomach cancer Neg Hx    Esophageal cancer Neg Hx    Social History   Socioeconomic History   Marital status: Divorced    Spouse name: Not on file   Number of children: Not on file   Years of education: Not on file   Highest education level: Not on file  Occupational History   Not on file  Tobacco Use   Smoking status: Never   Smokeless tobacco: Never  Vaping Use   Vaping Use: Never used  Substance and Sexual Activity   Alcohol use: Yes    Comment: rarely 1 every 6 months   Drug use: No   Sexual activity: Not Currently    Partners: Male    Birth control/protection: None  Other Topics Concern   Not on file  Social History Narrative   Divorced. Has 2 kids(fraternal twins) daughters age 37. Works at [employer], Never smoked, denies ETOH, no drugs. Drinks diet coke. No exercise.       DPR daughters [name] and Chaunda Vandergriff twins          Social Determinants of Health   Financial Resource Strain: Not on file  Food Insecurity: Not on file  Transportation Needs: Not on file  Physical Activity: Not on file  Stress: Not on file  Social Connections: Not on file  Intimate Partner Violence: Not on file   Current Meds  Medication Sig   aspirin EC 81 MG tablet Take 81 mg by mouth  daily.   CHELATED MAGNESIUM PO Take 250 mg by mouth daily.   clopidogrel (PLAVIX) 75 MG tablet TAKE 1 TABLET BY MOUTH ONCE DAILY   cyclobenzaprine (FLEXERIL) 10 MG tablet TAKE 1 TABLET (10 MG TOTAL) BY MOUTH 3 (THREE) TIMES DAILY AS NEEDED FOR UP TO 14 DAYS FOR MUSCLE  SPASMS.   cyclobenzaprine (FLEXERIL) 10 MG tablet TAKE ONE TABLET BY MOUTH AT BEDTIME.   folic acid (FOLVITE) 1 MG tablet TAKE 2 TABLETS BY MOUTH DAILY.   glucose blood test strip USE TO CHECK BLOOD SUGAR 10 TIMES DAILY (Patient taking differently: USE TO CHECK BLOOD SUGAR 10 TIMES DAILY)   insulin lispro (HUMALOG) 100 UNIT/ML injection Inject 80-90 Units into the skin continuous. FOR USE IN INSULIN PUMP. TOTAL DAILY INSULIN DOSE = UP TO 90 UNITS.   insulin lispro (HUMALOG) 100 UNIT/ML injection INJECT UP TO 90 UNITS DAILY IN INSULIN PUMP   ipratropium (ATROVENT) 0.06 % nasal spray Place 2 sprays into both nostrils 4 (four) times daily as needed for rhinitis.   isosorbide mononitrate (IMDUR) 60 MG 24 hr tablet TAKE 1 TABLET BY MOUTH DAILY.   Krill Oil 500 MG CAPS Take 500 mg by mouth daily.   leflunomide (ARAVA) 20 MG tablet TAKE 1 TABLET BY MOUTH DAILY   linaclotide (LINZESS) 290 MCG CAPS capsule Take 1 capsule (290 mcg total) by mouth daily before breakfast.   losartan (COZAAR) 100 MG tablet TAKE 1 TABLET BY MOUTH DAILY   metoprolol tartrate (LOPRESSOR) 50 MG tablet Take 0.5 tablets (25 mg total) by mouth 2 (two) times daily.   ondansetron (ZOFRAN) 4 MG tablet TAKE 1 TABLET BY MOUTH EVERY 8 HOURS AS NEEDED. ALTERNATE WITH PHENERGHAN   ondansetron (ZOFRAN-ODT) 4 MG disintegrating tablet Take 4 mg by mouth every 8 (eight) hours as needed.   ONE TOUCH ULTRA TEST test strip    pantoprazole (PROTONIX) 40 MG tablet TAKE 1 TABLET BY MOUTH DAILY. (Patient taking differently: Take 40 mg by mouth 2 (two) times daily.)   potassium chloride (KLOR-CON) 10 MEQ tablet Take 1 tablet (10 mEq total) by mouth daily.   promethazine (PHENERGAN) 25 MG  tablet TAKE 1/2- 1 TABLETS (12.5-25 MG TOTAL) BY MOUTH 2 TIMES DAILY AS NEEDED FOR NAUSEA OR VOMITING.   rosuvastatin (CRESTOR) 20 MG tablet Take 1 tablet (20 mg total) by mouth daily.   Semaglutide,0.25 or 0.5MG/DOS, (OZEMPIC, 0.25 OR 0.5 MG/DOSE,) 2 MG/1.5ML SOPN Inject one half mg into the skin once a week.   torsemide (DEMADEX) 20 MG tablet Take 1 tablet (20 mg total) by mouth daily.   UBRELVY 100 MG TABS Take 100 mg by mouth as needed.   Ubrogepant (UBRELVY) 100 MG TABS Take 1 tablet by mouth as needed   Upadacitinib ER (RINVOQ) 15 MG TB24 Take 1 tablet (15 mg) by mouth daily.   [DISCONTINUED] EPINEPHrine 0.3 mg/0.3 mL IJ SOAJ injection Inject 0.3 mLs (0.3 mg total) into the muscle as needed for anaphylaxis.   Allergies  Allergen Reactions   Actemra [Tocilizumab]    Prochlorperazine Rash    Neuro problems per pt   Ramipril Swelling, Rash and Other (See Comments)   Shellfish-Derived Products Swelling    Shrimp    Atorvastatin Rash    Elevated LFT's   Compazine  [Prochlorperazine Edisylate] Other (See Comments)    Neurological reaction   Emgality [Galcanezumab-Gnlm] Hives and Swelling   Etanercept Swelling and Rash   Infliximab Rash   Iohexol     Iv contrast dye -rash all over   Orencia [Abatacept] Rash   Prochlorperazine Edisylate     unknown   Shellfish Allergy Swelling    Shrimp(Facial swelling)   Tofacitinib Rash and Other (See Comments)    Severe abdominal pain    [employer]]     [employer], memory issues,  word finding issues   Tramadol     Nausea    Amiodarone Nausea Only   Rituximab Rash    Causes a rash   Recent Results (from the past 2160 hour(s))  HM DIABETES EYE EXAM     Status: Abnormal   Collection Time: 10/05/21 12:00 AM  Result Value Ref Range   HM Diabetic Eye Exam Retinopathy (A) No Retinopathy    Comment: type 1 dm PDR [place] [facility] Dr. Jacinto Reap   CBC with Differential/Platelet     Status: None   Collection Time: 10/31/21  2:16 PM   Result Value Ref Range   WBC 5.2 3.8 - 10.8 Thousand/uL   RBC 4.40 3.80 - 5.10 Million/uL   Hemoglobin 13.2 11.7 - 15.5 g/dL   HCT 38.6 35.0 - 45.0 %   MCV 87.7 80.0 - 100.0 fL   MCH 30.0 27.0 - 33.0 pg   MCHC 34.2 32.0 - 36.0 g/dL   RDW 13.0 11.0 - 15.0 %   Platelets 251 140 - 400 Thousand/uL   MPV 9.6 7.5 - 12.5 fL   Neutro Abs 2,720 1,500 - 7,800 cells/uL   Lymphs Abs 1,986 850 - 3,900 cells/uL   Absolute Monocytes 385 200 - 950 cells/uL   Eosinophils Absolute 78 15 - 500 cells/uL   Basophils Absolute 31 0 - 200 cells/uL   Neutrophils Relative % 52.3 %   Total Lymphocyte 38.2 %   Monocytes Relative 7.4 %   Eosinophils Relative 1.5 %   Basophils Relative 0.6 %  COMPLETE METABOLIC PANEL WITH GFR     Status: Abnormal   Collection Time: 10/31/21  2:16 PM  Result Value Ref Range   Glucose, Bld 160 (H) 65 - 99 mg/dL    Comment: .            Fasting reference interval . For someone without known diabetes, a glucose value >125 mg/dL indicates that they may have diabetes and this should be confirmed with a follow-up test. .    BUN 15 7 - 25 mg/dL   Creat 1.03 0.50 - 1.03 mg/dL   eGFR 65 > OR = 60 mL/min/1.54m    Comment: The eGFR is based on the CKD-EPI 2021 equation. To calculate  the new eGFR from a previous Creatinine or Cystatin C result, go to https://www.kidney.org/professionals/ kdoqi/gfr%5Fcalculator    BUN/Creatinine Ratio NOT APPLICABLE 6 - 22 (calc)   Sodium 137 135 - 146 mmol/L   Potassium 3.8 3.5 - 5.3 mmol/L   Chloride 98 98 - 110 mmol/L   CO2 28 20 - 32 mmol/L   Calcium 9.2 8.6 - 10.4 mg/dL   Total Protein 6.8 6.1 - 8.1 g/dL   Albumin 4.7 3.6 - 5.1 g/dL   Globulin 2.1 1.9 - 3.7 g/dL (calc)   AG Ratio 2.2 1.0 - 2.5 (calc)   Total Bilirubin 0.5 0.2 - 1.2 mg/dL   Alkaline phosphatase (APISO) 95 37 - 153 U/L   AST 23 10 - 35 U/L   ALT 18 6 - 29 U/L  QuantiFERON-TB Gold Plus     Status: None   Collection Time: 10/31/21  2:16 PM  Result Value Ref Range    QuantiFERON-TB Gold Plus NEGATIVE NEGATIVE    Comment: Negative test result. M. tuberculosis complex  infection unlikely.    NIL 0.03 IU/mL   Mitogen-NIL 8.38 IU/mL   TB1-NIL 0.01 IU/mL   TB2-NIL 0.02 IU/mL    Comment: . The Nil tube value reflects the background interferon gamma  immune response of the patient's blood sample. This value has been subtracted from the patient's displayed TB and Mitogen results. . Lower than expected results with the Mitogen tube prevent false-negative Quantiferon readings by detecting a patient with a potential immune suppressive condition and/or suboptimal pre-analytical specimen handling. . The TB1 Antigen tube is coated with the M. tuberculosis-specific antigens designed to elicit responses from TB antigen primed CD4+ helper T-lymphocytes. . The TB2 Antigen tube is coated with the M. tuberculosis-specific antigens designed to elicit responses from TB antigen primed CD4+ helper and CD8+ cytotoxic T-lymphocytes. . For additional information, please refer to https://education.questdiagnostics.com/faq/FAQ204 (This link is being provided for informational/ educational purposes only.) .   Serum protein electrophoresis with reflex     Status: Abnormal   Collection Time: 10/31/21  2:16 PM  Result Value Ref Range   Total Protein 6.7 6.1 - 8.1 g/dL   Albumin ELP 4.3 3.8 - 4.8 g/dL   Alpha 1 0.3 0.2 - 0.3 g/dL   Alpha 2 0.9 0.5 - 0.9 g/dL   Beta Globulin 0.4 0.4 - 0.6 g/dL   Beta 2 0.3 0.2 - 0.5 g/dL   Gamma Globulin 0.5 (L) 0.8 - 1.7 g/dL   Abnormal Protein Band1 NOTE NONE DETECTED g/dL   SPE Interp.      Comment: . Consistent with hypogammaglobulinemia. Serum free light  chains or urine immunofixation should be considered if  plasma cell dyscrasias are a possible clinical  diagnosis. .   IFE Interpretation     Status: None   Collection Time: 10/31/21  2:16 PM  Result Value Ref Range   Immunofix Electr Int NO MONOCLONAL PROTEIN  DETECTED    Objective  Body mass index is 29.94 kg/m. Wt Readings from Last 3 Encounters:  11/14/21 169 lb (76.7 kg)  09/14/21 175 lb 12.8 oz (79.7 kg)  08/02/21 183 lb (83 kg)   Temp Readings from Last 3 Encounters:  11/14/21 (!) 97 F (36.1 C) (Temporal)  06/06/21 98.1 F (36.7 C)  03/17/21 98.4 F (36.9 C) (Oral)   BP Readings from Last 3 Encounters:  11/14/21 110/70  09/14/21 133/81  08/02/21 120/73   Pulse Readings from Last 3 Encounters:  11/14/21 81  09/14/21 76  08/02/21 81    Physical Exam Vitals and nursing note reviewed.  Constitutional:      Appearance: Normal appearance. She is well-developed and well-groomed.  HENT:     Head: Normocephalic and atraumatic.  Eyes:     Conjunctiva/sclera: Conjunctivae normal.     Pupils: Pupils are equal, round, and reactive to light.  Cardiovascular:     Rate and Rhythm: Normal rate and regular rhythm.     Heart sounds: Normal heart sounds. No murmur heard. Pulmonary:     Effort: Pulmonary effort is normal.     Breath sounds: Normal breath sounds.  Abdominal:     General: Abdomen is flat. Bowel sounds are normal.     Tenderness: There is no abdominal tenderness.  Musculoskeletal:        General: No tenderness.  Skin:    General: Skin is warm and dry.  Neurological:     General: No focal deficit present.     Mental Status: She is alert and oriented to person, place, and time. Mental status is at baseline.     Cranial Nerves: Cranial nerves 2-12 are intact.     Gait: Gait is intact.  Psychiatric:        Attention and Perception: Attention and perception  normal.        Mood and Affect: Mood and affect normal.        Speech: Speech normal.        Behavior: Behavior normal. Behavior is cooperative.        Thought Content: Thought content normal.        Cognition and Memory: Cognition and memory normal.        Judgment: Judgment normal.    Assessment  Plan  Annual physical exam  Flu shot utd 08/2021 prevnar  utd  pna 23 utd  Tdap utd 06/25/18 covid vx had 2/2 declines further as of 11/15//22 note had covid shingrix 2/2    mammo 11/11/19 negative ordered sch 01/11/21 [place] 12/2021  DEXA 11/11/19 normal   Colonoscopy 08/30/17 normal Dr. Silvio Pate h/o colitis, 2021 established Dr. Vicente Males colonoscopy EGD sch 03/25/20   Pap no longer needed  S/p hysterectomy no h/o abnormal  Endometriosis total hysterectomy    SPEP abnormal consider nephrotic proteinuria consider renal in future 10/15/2019 no abnormal bands but low urine creatinine and protein    [facility] Dr. Steffanie Dunn appt 02/19/20 due to f/u 07/2020, 01/2021  Cards Dr. Gerrit Halls seen 03/10/20  [facility]-migraines  Dermatology Dr. Laurence Ferrari saw 12/2019 Rheumatology Dr. Estanislado Pandy  Hand surgery Dr. Fredna Dow Vascular Dr. Lucky Cowboy  Retinal specialist Dr. Tempie Hoist  GI: Dr. Jonathon Bellows     Rhinitis, unspecified type - Plan: ipratropium (ATROVENT) 0.06 % nasal spray   H/O multiple allergies - Plan: EPINEPHrine 0.3 mg/0.3 mL IJ SOAJ injection Can with AR can take xyzal qhs and zyrtec in am with prn atrovent rhinitis  Hx of anaphylaxis - Plan: EPINEPHrine 0.3 mg/0.3 mL IJ SOAJ injection  Type 1 diabetes mellitus with proliferative retinopathy, macular edema presence unspecified, unspecified laterality, unspecified proliferative retinopathy type (HCC)  On insulin pump 90 units or less and ozempic 0.5 weekly lost wt and no side effects ozempic  Provider: Dr. Olivia Mackie  [facility]

## 2021-11-22 ENCOUNTER — Other Ambulatory Visit: Payer: Self-pay

## 2021-11-22 DIAGNOSIS — E10319 Type 1 diabetes mellitus with unspecified diabetic retinopathy without macular edema: Secondary | ICD-10-CM | POA: Diagnosis not present

## 2021-11-22 DIAGNOSIS — E0500 Thyrotoxicosis with diffuse goiter without thyrotoxic crisis or storm: Secondary | ICD-10-CM | POA: Diagnosis not present

## 2021-11-22 MED ORDER — "INSULIN SYRINGE-NEEDLE U-100 31G X 5/16"" 0.3 ML MISC"
12 refills | Status: DC
  Filled 2021-11-22: qty 100, 30d supply, fill #0

## 2021-11-24 ENCOUNTER — Other Ambulatory Visit (HOSPITAL_COMMUNITY): Payer: Self-pay

## 2021-11-24 ENCOUNTER — Other Ambulatory Visit: Payer: Self-pay

## 2021-11-27 ENCOUNTER — Other Ambulatory Visit (HOSPITAL_COMMUNITY): Payer: Self-pay

## 2021-11-28 ENCOUNTER — Other Ambulatory Visit (HOSPITAL_COMMUNITY): Payer: Self-pay

## 2021-11-29 ENCOUNTER — Other Ambulatory Visit (HOSPITAL_COMMUNITY): Payer: Self-pay

## 2021-11-29 ENCOUNTER — Other Ambulatory Visit: Payer: Self-pay | Admitting: Physician Assistant

## 2021-11-30 ENCOUNTER — Other Ambulatory Visit: Payer: Self-pay | Admitting: Physician Assistant

## 2021-11-30 ENCOUNTER — Other Ambulatory Visit: Payer: Self-pay

## 2021-12-05 ENCOUNTER — Other Ambulatory Visit (HOSPITAL_COMMUNITY): Payer: Self-pay

## 2021-12-06 ENCOUNTER — Other Ambulatory Visit: Payer: Self-pay

## 2021-12-11 MED FILL — Isosorbide Mononitrate Tab ER 24HR 60 MG: ORAL | 90 days supply | Qty: 90 | Fill #2 | Status: AC

## 2021-12-12 ENCOUNTER — Other Ambulatory Visit: Payer: Self-pay

## 2021-12-17 ENCOUNTER — Other Ambulatory Visit: Payer: Self-pay | Admitting: Physician Assistant

## 2021-12-17 MED FILL — Clopidogrel Bisulfate Tab 75 MG (Base Equiv): ORAL | 90 days supply | Qty: 90 | Fill #2 | Status: AC

## 2021-12-18 ENCOUNTER — Other Ambulatory Visit: Payer: Self-pay

## 2021-12-19 ENCOUNTER — Other Ambulatory Visit (HOSPITAL_COMMUNITY): Payer: Self-pay

## 2021-12-19 ENCOUNTER — Other Ambulatory Visit: Payer: Self-pay

## 2021-12-19 ENCOUNTER — Other Ambulatory Visit: Payer: Self-pay | Admitting: Rheumatology

## 2021-12-19 DIAGNOSIS — M0609 Rheumatoid arthritis without rheumatoid factor, multiple sites: Secondary | ICD-10-CM

## 2021-12-19 MED ORDER — RINVOQ 15 MG PO TB24
15.0000 mg | ORAL_TABLET | Freq: Every day | ORAL | 2 refills | Status: DC
  Filled 2021-12-26: qty 30, 30d supply, fill #0

## 2021-12-19 NOTE — Telephone Encounter (Signed)
Next Visit: 02/20/2022   Last Visit: 09/14/2021    Last Fill: 10/27/2021  Current Dose per office note 09/14/2021: Rinvoq 15 mg 1 tablet by mouth once daily    Labs: 10/31/2021 CBC WNL. Glucose is 160. Rest of CMP WNL.   TB Gold: 10/31/2021 Neg  Okay to refill Rinvoq?

## 2021-12-20 ENCOUNTER — Other Ambulatory Visit: Payer: Self-pay

## 2021-12-21 ENCOUNTER — Other Ambulatory Visit: Payer: Self-pay

## 2021-12-21 MED ORDER — CONTOUR NEXT TEST VI STRP
ORAL_STRIP | 5 refills | Status: DC
  Filled 2021-12-21: qty 300, 30d supply, fill #0

## 2021-12-26 ENCOUNTER — Other Ambulatory Visit: Payer: Self-pay | Admitting: Pharmacist

## 2021-12-26 ENCOUNTER — Other Ambulatory Visit (HOSPITAL_COMMUNITY): Payer: Self-pay

## 2021-12-26 DIAGNOSIS — G4733 Obstructive sleep apnea (adult) (pediatric): Secondary | ICD-10-CM | POA: Diagnosis not present

## 2021-12-26 DIAGNOSIS — M0609 Rheumatoid arthritis without rheumatoid factor, multiple sites: Secondary | ICD-10-CM

## 2021-12-26 MED ORDER — RINVOQ 15 MG PO TB24
15.0000 mg | ORAL_TABLET | Freq: Every day | ORAL | 2 refills | Status: DC
  Filled 2021-12-26: qty 30, 30d supply, fill #0
  Filled 2022-01-19: qty 30, 30d supply, fill #1
  Filled 2022-02-16: qty 30, 30d supply, fill #2

## 2021-12-29 ENCOUNTER — Other Ambulatory Visit: Payer: Self-pay | Admitting: Gastroenterology

## 2021-12-29 ENCOUNTER — Other Ambulatory Visit: Payer: Self-pay

## 2021-12-29 MED ORDER — PANTOPRAZOLE SODIUM 40 MG PO TBEC
40.0000 mg | DELAYED_RELEASE_TABLET | Freq: Two times a day (BID) | ORAL | 3 refills | Status: DC
  Filled 2021-12-29 – 2022-01-08 (×2): qty 180, 90d supply, fill #0
  Filled 2022-04-04: qty 180, 90d supply, fill #1
  Filled 2022-07-06: qty 180, 90d supply, fill #2
  Filled 2022-10-03: qty 180, 90d supply, fill #3

## 2022-01-03 ENCOUNTER — Other Ambulatory Visit: Payer: Self-pay

## 2022-01-04 ENCOUNTER — Other Ambulatory Visit: Payer: Self-pay

## 2022-01-05 ENCOUNTER — Encounter (INDEPENDENT_AMBULATORY_CARE_PROVIDER_SITE_OTHER): Payer: 59 | Admitting: Ophthalmology

## 2022-01-05 ENCOUNTER — Other Ambulatory Visit: Payer: Self-pay

## 2022-01-05 DIAGNOSIS — E103593 Type 1 diabetes mellitus with proliferative diabetic retinopathy without macular edema, bilateral: Secondary | ICD-10-CM | POA: Diagnosis not present

## 2022-01-05 DIAGNOSIS — H35033 Hypertensive retinopathy, bilateral: Secondary | ICD-10-CM

## 2022-01-05 DIAGNOSIS — I10 Essential (primary) hypertension: Secondary | ICD-10-CM | POA: Diagnosis not present

## 2022-01-08 ENCOUNTER — Other Ambulatory Visit: Payer: Self-pay

## 2022-01-09 ENCOUNTER — Encounter: Payer: Self-pay | Admitting: Adult Health

## 2022-01-09 ENCOUNTER — Other Ambulatory Visit: Payer: Self-pay

## 2022-01-09 ENCOUNTER — Telehealth (INDEPENDENT_AMBULATORY_CARE_PROVIDER_SITE_OTHER): Payer: 59 | Admitting: Adult Health

## 2022-01-09 ENCOUNTER — Telehealth: Payer: Self-pay | Admitting: Internal Medicine

## 2022-01-09 VITALS — Ht 62.99 in | Wt 167.0 lb

## 2022-01-09 DIAGNOSIS — R42 Dizziness and giddiness: Secondary | ICD-10-CM

## 2022-01-09 DIAGNOSIS — J029 Acute pharyngitis, unspecified: Secondary | ICD-10-CM | POA: Diagnosis not present

## 2022-01-09 DIAGNOSIS — R0981 Nasal congestion: Secondary | ICD-10-CM

## 2022-01-09 DIAGNOSIS — H9201 Otalgia, right ear: Secondary | ICD-10-CM | POA: Diagnosis not present

## 2022-01-09 MED ORDER — AMOXICILLIN-POT CLAVULANATE 875-125 MG PO TABS
1.0000 | ORAL_TABLET | Freq: Two times a day (BID) | ORAL | 0 refills | Status: DC
  Filled 2022-01-09: qty 20, 10d supply, fill #0

## 2022-01-09 NOTE — Telephone Encounter (Signed)
Patient scheduled to see michelle Flinchum today at, 01/09/22 at 4:30

## 2022-01-09 NOTE — Patient Instructions (Signed)
Dizziness Dizziness is a common problem. It makes you feel unsteady or light-headed. You may feel like you are about to pass out (faint). Dizziness can lead to getting hurt if you stumble or fall. Dizziness can be caused by many things, including: Medicines. Not having enough water in your body (dehydration). Illness. Follow these instructions at home: Eating and drinking  Drink enough fluid to keep your pee (urine) pale yellow. This helps to keep you from getting dehydrated. Try to drink more clear fluids, such as water. Do not drink alcohol. Limit how much caffeine you drink or eat, if your doctor tells you to do that. Limit how much salt (sodium) you drink or eat, if your doctor tells you to do that. Activity pibNameAmoxicillin; Clavulanic Acid Tablets What is this medication? AMOXICILLIN; CLAVULANIC ACID (a mox i SIL in; KLAV yoo lan ic AS id) treats infections caused by bacteria. It belongs to a group of medications called penicillin antibiotics. It will not treat colds, the flu, or infections caused by viruses. This medicine may be used for other purposes; ask your health care provider or pharmacist if you have questions. COMMON BRAND NAME(S): Augmentin What should I tell my care team before I take this medication? They need to know if you have any of these conditions: Kidney disease Liver disease Mononucleosis Stomach or intestine problems such as colitis An unusual or allergic reaction to amoxicillin, other penicillin or cephalosporin antibiotics, clavulanic acid, other medications, foods, dyes, or preservatives Pregnant or trying to get pregnant Breast-feeding How should I use this medication? Take this medication by mouth. Take it as directed on the prescription label at the same time every day. Take it with food at the start of a meal or snack. Take all of this medication unless your care team tells you to stop it early. Keep taking it even if you think you are better. Talk  to your care team about the use of this medication in children. While it may be prescribed for selected conditions, precautions do apply. Overdosage: If you think you have taken too much of this medicine contact a poison control center or emergency room at once. NOTE: This medicine is only for you. Do not share this medicine with others. What if I miss a dose? If you miss a dose, take it as soon as you can. If it is almost time for your next dose, take only that dose. Do not take double or extra doses. What may interact with this medication? Allopurinol Anticoagulants Birth control pills Methotrexate Probenecid This list may not describe all possible interactions. Give your health care provider a list of all the medicines, herbs, non-prescription drugs, or dietary supplements you use. Also tell them if you smoke, drink alcohol, or use illegal drugs. Some items may interact with your medicine. What should I watch for while using this medication? Tell your care team if your symptoms do not start to get better or if they get worse. This medication may cause serious skin reactions. They can happen weeks to months after starting the medication. Contact your care team right away if you notice fevers or flu-like symptoms with a rash. The rash may be red or purple and then turn into blisters or peeling of the skin. Or, you might notice a red rash with swelling of the face, lips or lymph nodes in your neck or under your arms. Do not treat diarrhea with over the counter products. Contact your care team if you have diarrhea that lasts  more than 2 days or if it is severe and watery. If you have diabetes, you may get a false-positive result for sugar in your urine. Check with your care team. Birth control may not work properly while you are taking this medication. Talk to your care team about using an extra method of birth control. What side effects may I notice from receiving this medication? Side effects  that you should report to your care team as soon as possible: Allergic reactions--skin rash, itching, hives, swelling of the face, lips, tongue, or throat Liver injury--right upper belly pain, loss of appetite, nausea, light-colored stool, dark yellow or brown urine, yellowing skin or eyes, unusual weakness or fatigue Redness, blistering, peeling, or loosening of the skin, including inside the mouth Severe diarrhea, fever Unusual vaginal discharge, itching, or odor Side effects that usually do not require medical attention (report to your care team if they continue or are bothersome): Diarrhea Nausea Vomiting This list may not describe all possible side effects. Call your doctor for medical advice about side effects. You may report side effects to FDA at 1-800-FDA-1088. Where should I keep my medication? Keep out of the reach of children and pets. Store at room temperature between 20 and 25 degrees C (68 and 77 degrees F). Throw away any unused medication after the expiration date. NOTE: This sheet is a summary. It may not cover all possible information. If you have questions about this medicine, talk to your doctor, pharmacist, or health care provider.  2022 Elsevier/Gold Standard (2020-12-11 00:00:00) Earache, Adult An earache, or ear pain, can be caused by many things, including: An infection. Ear wax buildup. Ear pressure. Something in the ear that should not be there (foreign body). A sore throat. Tooth problems. Jaw problems. Treatment of the earache will depend on the cause. If the cause is not clear or cannot be determined, you may need to watch your symptoms until your earache goes away or until a cause is found. Follow these instructions at home: Medicines Take or apply over-the-counter and prescription medicines only as told by your health care provider. If you were prescribed an antibiotic medicine, use it as told by your health care provider. Do not stop using the  antibiotic even if you start to feel better. Do not put anything in your ear other than medicine that is prescribed by your health care provider. Managing pain If directed, apply heat to the affected area as often as told by your health care provider. Use the heat source that your health care provider recommends, such as a moist heat pack or a heating pad. Place a towel between your skin and the heat source. Leave the heat on for 20-30 minutes. Remove the heat if your skin turns bright red. This is especially important if you are unable to feel pain, heat, or cold. You may have a greater risk of getting burned. If directed, put ice on the affected area as often as told by your health care provider. To do this:   Put ice in a plastic bag. Place a towel between your skin and the bag. Leave the ice on for 20 minutes, 2-3 times a day. General instructions Pay attention to any changes in your symptoms. Try resting in an upright position instead of lying down. This may help to reduce pressure in your ear and relieve pain. Chew gum if it helps to relieve your ear pain. Treat any allergies as told by your health care provider. Drink enough fluid to  keep your urine pale yellow. It is up to you to get the results of any tests that were done. Ask your health care provider, or the department that is doing the tests, when your results will be ready. Keep all follow-up visits as told by your health care provider. This is important. Contact a health care provider if: Your pain does not improve within 2 days. Your earache gets worse. You have new symptoms. You have a fever. Get help right away if you: Have a severe headache. Have a stiff neck. Have trouble swallowing. Have redness or swelling behind your ear. Have fluid or blood coming from your ear. Have hearing loss. Feel dizzy. Summary An earache, or ear pain, can be caused by many things. Treatment of the earache will depend on the cause.  Follow recommendations from your health care provider to treat your ear pain. If the cause is not clear or cannot be determined, you may need to watch your symptoms until your earache goes away or until a cause is found. Keep all follow-up visits as told by your health care provider. This is important. This information is not intended to replace advice given to you by your health care provider. Make sure you discuss any questions you have with your health care provider. Document Revised: 07/25/2019 Document Reviewed: 07/25/2019 Elsevier Patient Education  [place].

## 2022-01-09 NOTE — Telephone Encounter (Signed)
Pt is having dizzy spells and sometimes vomiting from them. Pt said its been going on for about week n half. Sent to access nurse

## 2022-01-09 NOTE — Progress Notes (Signed)
Virtual Visit via Video Note  I connected with [name] on 01/09/22 at  4:30 PM EST by a video enabled telemedicine application and verified that I am speaking with the correct person using two identifiers.  Location: Patient: at home  Provider: Provider: Provider's office at  [facility], [place].      I discussed the limitations of evaluation and management by telemedicine and the availability of in person appointments. The patient expressed understanding and agreed to proceed.  History of Present Illness:  Patient reports she has had some  dizziness 1.5 weeks ago. She vomited once Wednesday last week, she stayed home on Thursday last week from work.  She has been working since then. She feels sudden head movements make her nauseated. Mild headache rates as 5/10 mostly frontal/ eyes/ no vision changes, no eye pain. Recent eye exam last week was normal per patient.  started this afternoon. Denies any numbness or weakness on either side body bo speech changes.  She does have some sinus drainage. Right ear pain, pressure and fluid possibly in it.  Sinus pressure.   Last week she tried dramamine and it did help her symptoms.   Blood sugars have been slightly off, running 130' today.   Rheumatology on [facility] and [facility].   Blood pressure reported as 130/79 pulse 83.  Denies any known exposures except for Christmas, was exposed to covid then. She does work in hospital.  She has tested x 1 and negative.  Denies any syncopal episodes.   Patient  denies any fever, body aches,chills, rash, chest pain, shortness of breath, nausea,  no more vomiting, since vomiting once last week on Wednesday or diarrhea.  Denies dizziness, lightheadedness, pre syncopal or syncopal episodes.    Past Medical History:  Diagnosis Date   ACUT MI ANTEROLAT WALL SUBSQT EPIS CARE    Acute maxillary sinusitis    ALLERGIC RHINITIS, SEASONAL    ARTHRITIS, RHEUMATOID     shoulders and hands Enbrel>leg swelling Dr. Estanislado Pandy   Atrial fibrillation [facility])    a. after CABG.   Back pain    Bruxism (teeth grinding)    CAD, ARTERY BYPASS GRAFT    a. DES to RCA in 2010 then LAD occlusion s/p CABG 3 06/07/2009 with LIMA to LAD, reverse SVG to D1, reverse SVG to distal RCA. b. Cath 05/08/2016 slightly hypodense region in the intermediate branch, however she had excellent flow, FFR was normal. Vein graft to PDA and the posterolateral branch is patent, patent LIMA to LAD, occluded SVG to diagonal.   CARPAL TUNNEL SYNDROME, BILATERAL    CHOLELITHIASIS    Contrast media allergy    DERMATITIS, ALLERGIC    DIABETES MELLITUS, TYPE I    on insulin pump dx'ed age 56 y.o    DIABETIC  RETINOPATHY    Hiatal hernia    HYPERLIPIDEMIA-MIXED    HYPERTHYROIDISM    Dr. Dollene Cleveland   Insulin pump in place    MIGRAINE W/O AURA W/INTRACT W/STATUS MIGRAINOSUS 02/19/2008   PONV (postoperative nausea and vomiting)    Psoriasis    SINUS TACHYCARDIA 11/08/2010   Sleep apnea    not using machine yet    SVT (supraventricular tachycardia) ([place])    after s/p CABG   TRIGGER FINGER    all fingers b/l hands    URI     [facility] Dr. Steffanie Dunn appt 02/19/20 due to f/u 07/2020, 01/2021  Cards Dr. Gerrit Halls seen 03/10/20  [facility]-migraines  Dermatology Dr. Laurence Ferrari saw 12/2019 Rheumatology Dr. Estanislado Pandy  Hand surgery Dr. Fredna Dow Vascular Dr. Lucky Cowboy  Retinal specialist Dr. Tempie Hoist  GI: Dr. Jonathon Bellows    Observations/Objective:    Patient is alert and oriented and responsive to questions Engages in conversation with provider. Speaks in full sentences without any pauses without any shortness of breath or distress.    Assessment and Plan:  1. Dizziness Labs ordered. She prefers to continue trying the dramamine as it seemed to help, vital signs blood pressure and heart rate are within normal limits per patients reports. Increase fluids. Keep blood pressure and blood sugar logs.   - CBC with Differential/Platelet; Future - Comprehensive metabolic panel; Future - TSH; Future - Hemoglobin A1c; Future - COVID-19, Flu A+B and RSV; Future  2. Right ear pain Will test for viral infection, since can not see in ear video visit and sinus symptoms over 1 week will cover with Augmentin, for otitis media and sinusitis possible.  - COVID-19, Flu A+B and RSV; Future - amoxicillin-clavulanate (AUGMENTIN) 875-125 MG tablet; Take 1 tablet by mouth 2 (two) times daily.  Dispense: 20 tablet; Refill: 0  3. Sore throat Improving, no problems swallowing, sore throat was worse initially and now improving.  - COVID-19, Flu A+B and RSV; Future - Culture, Group A Strep; Future  4. Sinus congestion Treating as sinusitis.  - CBC with Differential/Platelet; Future - Comprehensive metabolic panel; Future - TSH; Future - Hemoglobin A1c; Future - COVID-19, Flu A+B and RSV; Future  Follow Up Instructions: Labs and testing 01/10/21 and in person visit at anytime if any symptoms worsening at anytime.  Advised in person evaluation at anytime is advised if any symptoms do not improve, worsen or change at any given time.  Red Flags discussed. The patient was given clear instructions to go to ER or return to medical center if any red flags develop, symptoms do not improve, worsen or new problems develop. They verbalized understanding.  Return in about 1 week (around 01/16/2022), or if symptoms worsen or fail to improve, for at any time for any worsening symptoms.    I discussed the assessment and treatment plan with the patient. The patient was provided an opportunity to ask questions and all were answered. The patient agreed with the plan and demonstrated an understanding of the instructions.   The patient was advised to call back or seek an in-person evaluation if the symptoms worsen or if the condition fails to improve as anticipated.    Marcille Buffy, FNP

## 2022-01-10 ENCOUNTER — Other Ambulatory Visit: Payer: Self-pay

## 2022-01-11 ENCOUNTER — Other Ambulatory Visit (INDEPENDENT_AMBULATORY_CARE_PROVIDER_SITE_OTHER): Payer: 59

## 2022-01-11 ENCOUNTER — Other Ambulatory Visit: Payer: Self-pay

## 2022-01-11 DIAGNOSIS — H9201 Otalgia, right ear: Secondary | ICD-10-CM | POA: Diagnosis not present

## 2022-01-11 DIAGNOSIS — R0981 Nasal congestion: Secondary | ICD-10-CM | POA: Diagnosis not present

## 2022-01-11 DIAGNOSIS — R42 Dizziness and giddiness: Secondary | ICD-10-CM

## 2022-01-11 DIAGNOSIS — J029 Acute pharyngitis, unspecified: Secondary | ICD-10-CM

## 2022-01-12 LAB — COMPREHENSIVE METABOLIC PANEL
ALT: 16 U/L (ref 0–35)
AST: 20 U/L (ref 0–37)
Albumin: 4.4 g/dL (ref 3.5–5.2)
Alkaline Phosphatase: 80 U/L (ref 39–117)
BUN: 10 mg/dL (ref 6–23)
CO2: 28 mEq/L (ref 19–32)
Calcium: 8.8 mg/dL (ref 8.4–10.5)
Chloride: 101 mEq/L (ref 96–112)
Creatinine, Ser: 0.9 mg/dL (ref 0.40–1.20)
GFR: 72.63 mL/min (ref >60.00)
Glucose, Bld: 108 mg/dL — ABNORMAL HIGH (ref 70–99)
Potassium: 3.5 mEq/L (ref 3.5–5.1)
Sodium: 140 mEq/L (ref 135–145)
Total Bilirubin: 0.4 mg/dL (ref 0.2–1.2)
Total Protein: 6.4 g/dL (ref 6.0–8.3)

## 2022-01-12 LAB — CBC WITH DIFFERENTIAL/PLATELET
Basophils Absolute: 0 10*3/uL (ref 0.0–0.1)
Basophils Relative: 0.6 % (ref 0.0–3.0)
Eosinophils Absolute: 0.1 10*3/uL (ref 0.0–0.7)
Eosinophils Relative: 2.3 % (ref 0.0–5.0)
HCT: 34.8 % — ABNORMAL LOW (ref 36.0–46.0)
Hemoglobin: 11.9 g/dL — ABNORMAL LOW (ref 12.0–15.0)
Lymphocytes Relative: 42.4 % (ref 12.0–46.0)
Lymphs Abs: 1.9 10*3/uL (ref 0.7–4.0)
MCHC: 34.2 g/dL (ref 30.0–36.0)
MCV: 88 fl (ref 78.0–100.0)
Monocytes Absolute: 0.4 10*3/uL (ref 0.1–1.0)
Monocytes Relative: 9.4 % (ref 3.0–12.0)
Neutro Abs: 2.1 10*3/uL (ref 1.4–7.7)
Neutrophils Relative %: 45.3 % (ref 43.0–77.0)
Platelets: 222 10*3/uL (ref 150.0–400.0)
RBC: 3.96 Mil/uL (ref 3.87–5.11)
RDW: 13.5 % (ref 11.5–15.5)
WBC: 4.6 10*3/uL (ref 4.0–10.5)

## 2022-01-12 LAB — COVID-19, FLU A+B AND RSV
Influenza A, NAA: NOT DETECTED
Influenza B, NAA: NOT DETECTED
RSV, NAA: NOT DETECTED
SARS-CoV-2, NAA: NOT DETECTED

## 2022-01-12 LAB — HEMOGLOBIN A1C: Hgb A1c MFr Bld: 6.8 % — ABNORMAL HIGH (ref 4.6–6.5)

## 2022-01-12 LAB — TSH: TSH: 1.07 u[IU]/mL (ref 0.35–5.50)

## 2022-01-13 LAB — CULTURE, GROUP A STREP
MICRO NUMBER:: 12863082
SPECIMEN QUALITY:: ADEQUATE

## 2022-01-13 NOTE — Progress Notes (Signed)
Cbc with mild elevated glucose.  CBC slightly low hemoglobin, need to check tibc ferritin and iron and return for lab draw. Any heavy vaginal bleeding or rectal bleeding.  Hemoglobin A1C is diabetic range. Strep test negative.  Covid flu and rsv is negative.  TSH for thyroid is negative.  Follow up advised with Dr. Merlyn Albert her endocrinologist for her diabetes.

## 2022-01-14 ENCOUNTER — Other Ambulatory Visit: Payer: Self-pay | Admitting: Cardiovascular Disease

## 2022-01-14 MED FILL — Losartan Potassium Tab 100 MG: ORAL | 90 days supply | Qty: 90 | Fill #1 | Status: AC

## 2022-01-15 ENCOUNTER — Other Ambulatory Visit: Payer: Self-pay | Admitting: Cardiovascular Disease

## 2022-01-15 ENCOUNTER — Other Ambulatory Visit (INDEPENDENT_AMBULATORY_CARE_PROVIDER_SITE_OTHER): Payer: 59

## 2022-01-15 ENCOUNTER — Other Ambulatory Visit: Payer: Self-pay

## 2022-01-15 ENCOUNTER — Telehealth: Payer: Self-pay

## 2022-01-15 DIAGNOSIS — D649 Anemia, unspecified: Secondary | ICD-10-CM

## 2022-01-15 MED FILL — Torsemide Tab 20 MG: ORAL | 30 days supply | Qty: 30 | Fill #0 | Status: AC

## 2022-01-15 NOTE — Telephone Encounter (Signed)
Labs ordered for IBC+Ferritin for future labs

## 2022-01-15 NOTE — Addendum Note (Signed)
Addended by: Baron Hamper on: 01/15/2022 08:14 AM   Modules accepted: Orders

## 2022-01-16 ENCOUNTER — Other Ambulatory Visit: Payer: Self-pay

## 2022-01-16 ENCOUNTER — Ambulatory Visit
Admission: RE | Admit: 2022-01-16 | Discharge: 2022-01-16 | Disposition: A | Discharge status: Home / Self Care | Payer: 59 | Source: Ambulatory Visit | Attending: Internal Medicine | Admitting: Internal Medicine

## 2022-01-16 ENCOUNTER — Encounter: Payer: Self-pay | Admitting: Adult Health

## 2022-01-16 ENCOUNTER — Other Ambulatory Visit: Payer: Self-pay | Admitting: Adult Health

## 2022-01-16 DIAGNOSIS — D509 Iron deficiency anemia, unspecified: Secondary | ICD-10-CM

## 2022-01-16 DIAGNOSIS — Z1231 Encounter for screening mammogram for malignant neoplasm of breast: Secondary | ICD-10-CM | POA: Insufficient documentation

## 2022-01-16 DIAGNOSIS — D649 Anemia, unspecified: Secondary | ICD-10-CM | POA: Insufficient documentation

## 2022-01-16 LAB — IBC + FERRITIN
Ferritin: 48.8 ng/mL (ref 10.0–291.0)
Iron: 59 ug/dL (ref 42–145)
Saturation Ratios: 16.3 % — ABNORMAL LOW (ref 20.0–50.0)
TIBC: 361.2 ug/dL (ref 250.0–450.0)
Transferrin: 258 mg/dL (ref 212.0–360.0)

## 2022-01-16 MED ORDER — IRON (FERROUS SULFATE) 325 (65 FE) MG PO TABS
325.0000 mg | ORAL_TABLET | Freq: Every day | ORAL | 0 refills | Status: DC
  Filled 2022-01-16: qty 90, fill #0

## 2022-01-16 NOTE — Progress Notes (Signed)
Meds ordered this encounter  Medications   Iron, Ferrous Sulfate, 325 (65 Fe) MG TABS    Sig: Take 325 mg by mouth daily.    Dispense:  90 tablet    Refill:  0     Orders Placed This Encounter  Procedures   Iron, TIBC and Ferritin Panel    Standing Status:   Future    Standing Expiration Date:   01/16/2023   CBC with Differential/Platelet    Standing Status:   Future    Standing Expiration Date:   01/16/2023

## 2022-01-16 NOTE — Progress Notes (Signed)
Iron saturations are low, will start   Iron as sent to pharmacy once daily, increase fiber, stool softer if needed to prevent constipation from iron. Recheck TIBC ferritin and CBC in 3 months labs added and scheduled. Needs follow up to discuss, any heavy vaginal bleeding or rectal bleeding or dark stools?  May need EGD and gastroenterology referral if she does not have one.

## 2022-01-17 ENCOUNTER — Other Ambulatory Visit: Payer: Self-pay

## 2022-01-18 ENCOUNTER — Other Ambulatory Visit (HOSPITAL_COMMUNITY): Payer: Self-pay

## 2022-01-19 ENCOUNTER — Other Ambulatory Visit (HOSPITAL_COMMUNITY): Payer: Self-pay

## 2022-01-22 ENCOUNTER — Other Ambulatory Visit (HOSPITAL_COMMUNITY): Payer: Self-pay

## 2022-01-23 ENCOUNTER — Ambulatory Visit: Payer: 59 | Admitting: Internal Medicine

## 2022-01-23 ENCOUNTER — Other Ambulatory Visit (HOSPITAL_COMMUNITY): Payer: Self-pay

## 2022-01-24 ENCOUNTER — Other Ambulatory Visit: Payer: Self-pay | Admitting: Internal Medicine

## 2022-01-25 ENCOUNTER — Other Ambulatory Visit: Payer: Self-pay

## 2022-01-25 MED ORDER — CYCLOBENZAPRINE HCL 10 MG PO TABS
ORAL_TABLET | ORAL | 2 refills | Status: DC
  Filled 2022-01-25 – 2022-01-26 (×2): qty 30, 30d supply, fill #0
  Filled 2022-03-04: qty 30, 30d supply, fill #1
  Filled 2022-04-04: qty 30, 30d supply, fill #2

## 2022-01-26 ENCOUNTER — Other Ambulatory Visit: Payer: Self-pay

## 2022-01-26 ENCOUNTER — Encounter: Payer: Self-pay | Admitting: Internal Medicine

## 2022-01-26 ENCOUNTER — Ambulatory Visit: Payer: 59 | Admitting: Internal Medicine

## 2022-01-26 VITALS — BP 130/64 | HR 74 | Temp 98.0°F | Ht 62.99 in | Wt 171.8 lb

## 2022-01-26 DIAGNOSIS — R591 Generalized enlarged lymph nodes: Secondary | ICD-10-CM | POA: Diagnosis not present

## 2022-01-26 DIAGNOSIS — D509 Iron deficiency anemia, unspecified: Secondary | ICD-10-CM | POA: Diagnosis not present

## 2022-01-26 DIAGNOSIS — R771 Abnormality of globulin: Secondary | ICD-10-CM | POA: Diagnosis not present

## 2022-01-26 NOTE — Progress Notes (Signed)
Chief Complaint  Patient presents with   Anemia   F/u 1. Anemia  h/h drop this is 1st time and h/o blood transfusion in 2010 with heart surgery s/p hysterectomy egd nl 03/25/20 and colonoscopy 03/25/20 no sig changes f/u in 5 years Dr. Vicente Males s/p hysterectomy she is on plavix and aspirin for CAD no blood in stools but black been taking iron x 1 week  H/o IFE abnormal and low gamma gobulin  2. Sore throat and ? Glands swollen in neck  Review of Systems  Constitutional:  Negative for weight loss.  HENT:  Negative for hearing loss.   Eyes:  Negative for blurred vision.  Respiratory:  Negative for shortness of breath.   Cardiovascular:  Negative for chest pain.  Gastrointestinal:  Negative for abdominal pain and blood in stool.  Genitourinary:  Negative for dysuria.  Musculoskeletal:  Negative for falls and joint pain.  Skin:  Negative for rash.  Neurological:  Negative for headaches.  Psychiatric/Behavioral:  Negative for depression.   Past Medical History:  Diagnosis Date   ACUT MI ANTEROLAT WALL SUBSQT EPIS CARE    Acute maxillary sinusitis    ALLERGIC RHINITIS, SEASONAL    ARTHRITIS, RHEUMATOID    shoulders and hands Enbrel>leg swelling Dr. Estanislado Pandy   Atrial fibrillation [facility])    a. after CABG.   Back pain    Bruxism (teeth grinding)    CAD, ARTERY BYPASS GRAFT    a. DES to RCA in 2010 then LAD occlusion s/p CABG 3 06/07/2009 with LIMA to LAD, reverse SVG to D1, reverse SVG to distal RCA. b. Cath 05/08/2016 slightly hypodense region in the intermediate branch, however she had excellent flow, FFR was normal. Vein graft to PDA and the posterolateral branch is patent, patent LIMA to LAD, occluded SVG to diagonal.   CARPAL TUNNEL SYNDROME, BILATERAL    CHOLELITHIASIS    Contrast media allergy    DERMATITIS, ALLERGIC    DIABETES MELLITUS, TYPE I    on insulin pump dx'ed age 34 y.o    DIABETIC  RETINOPATHY    Hiatal hernia    HYPERLIPIDEMIA-MIXED    HYPERTHYROIDISM    Dr. Dollene Cleveland   Insulin pump in place    MIGRAINE W/O AURA W/INTRACT W/STATUS MIGRAINOSUS 02/19/2008   PONV (postoperative nausea and vomiting)    Psoriasis    SINUS TACHYCARDIA 11/08/2010   Sleep apnea    not using machine yet    SVT (supraventricular tachycardia) ([place])    after s/p CABG   TRIGGER FINGER    all fingers b/l hands    URI    Past Surgical History:  Procedure Laterality Date   ABDOMINAL HYSTERECTOMY     endometriomas b/l; total ? cervix removed; no h/o abnormal pap   Caesarean section     CARDIAC CATHETERIZATION N/A 05/08/2016   Procedure: Left Heart Cath and Cors/Grafts Angiography;  Surgeon: Burnell Blanks, MD;  Location: [place] [facility];  Service: Cardiovascular;  Laterality: N/A;   CARPAL TUNNEL RELEASE     b/l    CARPAL TUNNEL RELEASE Left 06/06/2021   Procedure: CARPAL TUNNEL RELEASE;  Surgeon: Daryll Brod, MD;  Location: [place];  Service: Orthopedics;  Laterality: Left;  AXILLARY BLOCK   CATARACT EXTRACTION, BILATERAL     CHOLECYSTECTOMY     COLONOSCOPY WITH PROPOFOL N/A 03/25/2020   Procedure: COLONOSCOPY WITH PROPOFOL;  Surgeon: Jonathon Bellows, MD;  Location: [facility] ENDOSCOPY;  Service: Gastroenterology;  Laterality: N/A;   CORONARY  ARTERY BYPASS GRAFT     ESOPHAGOGASTRODUODENOSCOPY (EGD) WITH PROPOFOL N/A 03/25/2020   Procedure: ESOPHAGOGASTRODUODENOSCOPY (EGD) WITH PROPOFOL;  Surgeon: Jonathon Bellows, MD;  Location: [facility] ENDOSCOPY;  Service: Gastroenterology;  Laterality: N/A;   EYE SURGERY     laser x 2 for retinopathy    LEFT HEART CATH AND CORS/GRAFTS ANGIOGRAPHY N/A 02/22/2020   Procedure: LEFT HEART CATH AND CORS/GRAFTS ANGIOGRAPHY;  Surgeon: Burnell Blanks, MD;  Location: [place] [facility];  Service: Cardiovascular;  Laterality: N/A;   TRIGGER FINGER RELEASE     b/l fingers all    ULNAR NERVE TRANSPOSITION Left 06/06/2021   Procedure: ULNAR NERVE DECOMPRESSION;  Surgeon: Daryll Brod, MD;  Location: [place];   Service: Orthopedics;  Laterality: Left;  AXILLARY BLOCK   VITRECTOMY     b/l    Family History  Problem Relation Age of Onset   Depression Mother    Anxiety disorder Mother    Colon polyps Father    Diabetes Father        2   Hypertension Father    Parkinson's disease Father    Diabetes Other    Heart disease Other    Hypertension Other    Hyperlipidemia Other    Depression Other    Migraines Other    Stroke Paternal Grandmother    Heart attack Neg Hx    Colon cancer Neg Hx    Stomach cancer Neg Hx    Esophageal cancer Neg Hx    Social History   Socioeconomic History   Marital status: Divorced    Spouse name: Not on file   Number of children: Not on file   Years of education: Not on file   Highest education level: Not on file  Occupational History   Not on file  Tobacco Use   Smoking status: Never   Smokeless tobacco: Never  Vaping Use   Vaping Use: Never used  Substance and Sexual Activity   Alcohol use: Yes    Comment: rarely 1 every 6 months   Drug use: No   Sexual activity: Not Currently    Partners: Male    Birth control/protection: None  Other Topics Concern   Not on file  Social History Narrative   Divorced. Has 2 kids(fraternal twins) daughters age 81. Works at [employer], Never smoked, denies ETOH, no drugs. Drinks diet coke. No exercise.       DPR daughters [name] and Jackilyn Umphlett twins          Social Determinants of Health   Financial Resource Strain: Not on file  Food Insecurity: Not on file  Transportation Needs: Not on file  Physical Activity: Not on file  Stress: Not on file  Social Connections: Not on file  Intimate Partner Violence: Not on file   Current Meds  Medication Sig   aspirin EC 81 MG tablet Take 81 mg by mouth daily.   CHELATED MAGNESIUM PO Take 250 mg by mouth daily.   clopidogrel (PLAVIX) 75 MG tablet TAKE 1 TABLET BY MOUTH ONCE DAILY   cyclobenzaprine (FLEXERIL) 10 MG tablet TAKE ONE TABLET BY MOUTH AT  BEDTIME.   EPINEPHrine 0.3 mg/0.3 mL IJ SOAJ injection Inject 0.3 mg into the muscle as needed for anaphylaxis.   glucose blood (CONTOUR NEXT TEST) test strip USE TO CHECK BLOOD SUGAR 10 TIMES DAILY   insulin lispro (HUMALOG) 100 UNIT/ML injection Inject 80-90 Units into the skin continuous. FOR USE IN INSULIN PUMP. TOTAL DAILY  INSULIN DOSE = UP TO 90 UNITS.   insulin lispro (HUMALOG) 100 UNIT/ML injection INJECT UP TO 90 UNITS DAILY IN INSULIN PUMP   Insulin Syringe-Needle U-100 (BD INSULIN SYRINGE U/F) 31G X 5/16" 0.3 ML MISC use with insulin injections 3 times daily in the event of insulin pump failure.   ipratropium (ATROVENT) 0.06 % nasal spray Place 2 sprays into both nostrils 4 (four) times daily as needed for rhinitis.   Iron, Ferrous Sulfate, 325 (65 Fe) MG TABS Take 325 mg by mouth daily.   isosorbide mononitrate (IMDUR) 60 MG 24 hr tablet TAKE 1 TABLET BY MOUTH DAILY.   Krill Oil 500 MG CAPS Take 500 mg by mouth daily.   leflunomide (ARAVA) 20 MG tablet TAKE 1 TABLET BY MOUTH DAILY   linaclotide (LINZESS) 290 MCG CAPS capsule Take 1 capsule (290 mcg total) by mouth daily before breakfast.   losartan (COZAAR) 100 MG tablet TAKE 1 TABLET BY MOUTH DAILY   metoprolol tartrate (LOPRESSOR) 50 MG tablet Take 0.5 tablets (25 mg total) by mouth 2 (two) times daily.   mupirocin ointment (BACTROBAN) 2 % Apply topically 3 (three) times daily for 7 days   ondansetron (ZOFRAN) 4 MG tablet TAKE 1 TABLET BY MOUTH EVERY 8 HOURS AS NEEDED. ALTERNATE WITH PHENERGHAN   ondansetron (ZOFRAN-ODT) 4 MG disintegrating tablet Take 4 mg by mouth every 8 (eight) hours as needed.   ONE TOUCH ULTRA TEST test strip    pantoprazole (PROTONIX) 40 MG tablet Take 1 tablet (40 mg total) by mouth 2 (two) times daily.   potassium chloride (KLOR-CON) 10 MEQ tablet Take 1 tablet (10 mEq total) by mouth daily.   promethazine (PHENERGAN) 25 MG tablet TAKE 1/2- 1 TABLETS (12.5-25 MG TOTAL) BY MOUTH 2 TIMES DAILY AS NEEDED FOR  NAUSEA OR VOMITING.   rosuvastatin (CRESTOR) 20 MG tablet Take 1 tablet (20 mg total) by mouth daily.   Semaglutide,0.25 or 0.5MG/DOS, (OZEMPIC, 0.25 OR 0.5 MG/DOSE,) 2 MG/1.5ML SOPN Inject one half mg into the skin once a week.   torsemide (DEMADEX) 20 MG tablet Take 1 tablet (20 mg total) by mouth daily. Stop Lasix. Please call our office to schedule an appointment for any future refills Thank you. [phone]. 1st attempt   UBRELVY 100 MG TABS Take 100 mg by mouth as needed.   Ubrogepant (UBRELVY) 100 MG TABS Take 1 tablet by mouth as needed   Upadacitinib ER (RINVOQ) 15 MG TB24 Take 1 tablet (15 mg) by mouth daily.   Allergies  Allergen Reactions   Actemra [Tocilizumab]    Prochlorperazine Rash    Neuro problems per pt   Ramipril Swelling, Rash and Other (See Comments)   Shellfish-Derived Products Swelling    Shrimp    Atorvastatin Rash    Elevated LFT's   Compazine  [Prochlorperazine Edisylate] Other (See Comments)    Neurological reaction   Emgality [Galcanezumab-Gnlm] Hives and Swelling   Etanercept Swelling and Rash   Infliximab Rash   Iohexol     Iv contrast dye -rash all over   Orencia [Abatacept] Rash   Prochlorperazine Edisylate     unknown   Shellfish Allergy Swelling    Shrimp(Facial swelling)   Tofacitinib Rash and Other (See Comments)    Severe abdominal pain    [employer]]     [employer], memory issues, word finding issues   Tramadol     Nausea    Amiodarone Nausea Only   Rituximab Rash    Causes  a rash   Recent Results (from the past 2160 hour(s))  CBC with Differential/Platelet     Status: None   Collection Time: 10/31/21  2:16 PM  Result Value Ref Range   WBC 5.2 3.8 - 10.8 Thousand/uL   RBC 4.40 3.80 - 5.10 Million/uL   Hemoglobin 13.2 11.7 - 15.5 g/dL   HCT 38.6 35.0 - 45.0 %   MCV 87.7 80.0 - 100.0 fL   MCH 30.0 27.0 - 33.0 pg   MCHC 34.2 32.0 - 36.0 g/dL   RDW 13.0 11.0 - 15.0 %   Platelets 251 140 - 400 Thousand/uL   MPV  9.6 7.5 - 12.5 fL   Neutro Abs 2,720 1,500 - 7,800 cells/uL   Lymphs Abs 1,986 850 - 3,900 cells/uL   Absolute Monocytes 385 200 - 950 cells/uL   Eosinophils Absolute 78 15 - 500 cells/uL   Basophils Absolute 31 0 - 200 cells/uL   Neutrophils Relative % 52.3 %   Total Lymphocyte 38.2 %   Monocytes Relative 7.4 %   Eosinophils Relative 1.5 %   Basophils Relative 0.6 %  COMPLETE METABOLIC PANEL WITH GFR     Status: Abnormal   Collection Time: 10/31/21  2:16 PM  Result Value Ref Range   Glucose, Bld 160 (H) 65 - 99 mg/dL    Comment: .            Fasting reference interval . For someone without known diabetes, a glucose value >125 mg/dL indicates that they may have diabetes and this should be confirmed with a follow-up test. .    BUN 15 7 - 25 mg/dL   Creat 1.03 0.50 - 1.03 mg/dL   eGFR 65 > OR = 60 mL/min/1.58m    Comment: The eGFR is based on the CKD-EPI 2021 equation. To calculate  the new eGFR from a previous Creatinine or Cystatin C result, go to https://www.kidney.org/professionals/ kdoqi/gfr%5Fcalculator    BUN/Creatinine Ratio NOT APPLICABLE 6 - 22 (calc)   Sodium 137 135 - 146 mmol/L   Potassium 3.8 3.5 - 5.3 mmol/L   Chloride 98 98 - 110 mmol/L   CO2 28 20 - 32 mmol/L   Calcium 9.2 8.6 - 10.4 mg/dL   Total Protein 6.8 6.1 - 8.1 g/dL   Albumin 4.7 3.6 - 5.1 g/dL   Globulin 2.1 1.9 - 3.7 g/dL (calc)   AG Ratio 2.2 1.0 - 2.5 (calc)   Total Bilirubin 0.5 0.2 - 1.2 mg/dL   Alkaline phosphatase (APISO) 95 37 - 153 U/L   AST 23 10 - 35 U/L   ALT 18 6 - 29 U/L  QuantiFERON-TB Gold Plus     Status: None   Collection Time: 10/31/21  2:16 PM  Result Value Ref Range   QuantiFERON-TB Gold Plus NEGATIVE NEGATIVE    Comment: Negative test result. M. tuberculosis complex  infection unlikely.    NIL 0.03 IU/mL   Mitogen-NIL 8.38 IU/mL   TB1-NIL 0.01 IU/mL   TB2-NIL 0.02 IU/mL    Comment: . The Nil tube value reflects the background interferon gamma immune response of  the patient's blood sample. This value has been subtracted from the patient's displayed TB and Mitogen results. . Lower than expected results with the Mitogen tube prevent false-negative Quantiferon readings by detecting a patient with a potential immune suppressive condition and/or suboptimal pre-analytical specimen handling. . The TB1 Antigen tube is coated with the M. tuberculosis-specific antigens designed to elicit responses from TB antigen primed CD4+ helper T-lymphocytes. .Marland Kitchen  The TB2 Antigen tube is coated with the M. tuberculosis-specific antigens designed to elicit responses from TB antigen primed CD4+ helper and CD8+ cytotoxic T-lymphocytes. . For additional information, please refer to https://education.questdiagnostics.com/faq/FAQ204 (This link is being provided for informational/ educational purposes only.) .   Serum protein electrophoresis with reflex     Status: Abnormal   Collection Time: 10/31/21  2:16 PM  Result Value Ref Range   Total Protein 6.7 6.1 - 8.1 g/dL   Albumin ELP 4.3 3.8 - 4.8 g/dL   Alpha 1 0.3 0.2 - 0.3 g/dL   Alpha 2 0.9 0.5 - 0.9 g/dL   Beta Globulin 0.4 0.4 - 0.6 g/dL   Beta 2 0.3 0.2 - 0.5 g/dL   Gamma Globulin 0.5 (L) 0.8 - 1.7 g/dL   Abnormal Protein Band1 NOTE NONE DETECTED g/dL   SPE Interp.      Comment: . Consistent with hypogammaglobulinemia. Serum free light  chains or urine immunofixation should be considered if  plasma cell dyscrasias are a possible clinical  diagnosis. .   IFE Interpretation     Status: None   Collection Time: 10/31/21  2:16 PM  Result Value Ref Range   Immunofix Electr Int NO MONOCLONAL PROTEIN DETECTED   Hemoglobin A1c     Status: Abnormal   Collection Time: 01/11/22  2:53 PM  Result Value Ref Range   Hgb A1c MFr Bld 6.8 (H) 4.6 - 6.5 %    Comment: Glycemic Control Guidelines for People with Diabetes:Non Diabetic:  <6%Goal of Therapy: <7%Additional Action Suggested:  >8%   TSH     Status: None    Collection Time: 01/11/22  2:53 PM  Result Value Ref Range   TSH 1.07 0.35 - 5.50 uIU/mL  Comprehensive metabolic panel     Status: Abnormal   Collection Time: 01/11/22  2:53 PM  Result Value Ref Range   Sodium 140 135 - 145 mEq/L   Potassium 3.5 3.5 - 5.1 mEq/L   Chloride 101 96 - 112 mEq/L   CO2 28 19 - 32 mEq/L   Glucose, Bld 108 (H) 70 - 99 mg/dL   BUN 10 6 - 23 mg/dL   Creatinine, Ser 0.90 0.40 - 1.20 mg/dL   Total Bilirubin 0.4 0.2 - 1.2 mg/dL   Alkaline Phosphatase 80 39 - 117 U/L   AST 20 0 - 37 U/L   ALT 16 0 - 35 U/L   Total Protein 6.4 6.0 - 8.3 g/dL   Albumin 4.4 3.5 - 5.2 g/dL   GFR 72.63 >60.00 mL/min    Comment: Calculated using the CKD-EPI Creatinine Equation (2021)   Calcium 8.8 8.4 - 10.5 mg/dL  CBC with Differential/Platelet     Status: Abnormal   Collection Time: 01/11/22  2:53 PM  Result Value Ref Range   WBC 4.6 4.0 - 10.5 K/uL   RBC 3.96 3.87 - 5.11 Mil/uL   Hemoglobin 11.9 (L) 12.0 - 15.0 g/dL   HCT 34.8 (L) 36.0 - 46.0 %   MCV 88.0 78.0 - 100.0 fl   MCHC 34.2 30.0 - 36.0 g/dL   RDW 13.5 11.5 - 15.5 %   Platelets 222.0 150.0 - 400.0 K/uL   Neutrophils Relative % 45.3 43.0 - 77.0 %   Lymphocytes Relative 42.4 12.0 - 46.0 %   Monocytes Relative 9.4 3.0 - 12.0 %   Eosinophils Relative 2.3 0.0 - 5.0 %   Basophils Relative 0.6 0.0 - 3.0 %   Neutro Abs 2.1 1.4 - 7.7  K/uL   Lymphs Abs 1.9 0.7 - 4.0 K/uL   Monocytes Absolute 0.4 0.1 - 1.0 K/uL   Eosinophils Absolute 0.1 0.0 - 0.7 K/uL   Basophils Absolute 0.0 0.0 - 0.1 K/uL  Culture, Group A Strep     Status: None   Collection Time: 01/11/22  4:12 PM   Specimen: Throat  Result Value Ref Range   MICRO NUMBER: [id number]    SPECIMEN QUALITY: Adequate    SOURCE: NOT GIVEN    STATUS: FINAL    RESULT: No group A Streptococcus isolated   COVID-19, Flu A+B and RSV     Status: None   Collection Time: 01/11/22  4:12 PM   Specimen: Nasal Swab   Nasal Swab  Previously  Result Value Ref Range   SARS-CoV-2,  NAA Not Detected Not Detected   Influenza A, NAA Not Detected Not Detected   Influenza B, NAA Not Detected Not Detected   RSV, NAA Not Detected Not Detected   Test Information: Comment     Comment: This nucleic acid amplification test was developed and its performance characteristics determined by [employer]. Nucleic acid amplification tests include RT-PCR and TMA. This test has not been FDA cleared or approved. This test has been authorized by FDA under an Emergency Use Authorization (EUA). This test is only authorized for the duration of time the declaration that circumstances exist justifying the authorization of the emergency use of in vitro diagnostic tests for detection of SARS-CoV-2 virus and/or diagnosis of COVID-19 infection under section 564(b)(1) of the Act, 21 U.S.C. 224MGN-0(I) (1), unless the authorization is terminated or revoked sooner. When diagnostic testing is negative, the possibility of a false negative result should be considered in the context of a patient's recent exposures and the presence of clinical signs and symptoms consistent with COVID-19. An individual without symptoms of COVID-19 and who is not shedding SARS-CoV-2 virus wo uld expect to have a negative (not detected) result in this assay.   IBC + Ferritin     Status: Abnormal   Collection Time: 01/15/22  3:36 PM  Result Value Ref Range   Iron 59 42 - 145 ug/dL   Transferrin 258.0 212.0 - 360.0 mg/dL   Saturation Ratios 16.3 (L) 20.0 - 50.0 %   Ferritin 48.8 10.0 - 291.0 ng/mL   TIBC 361.2 250.0 - 450.0 mcg/dL   Objective  Body mass index is 30.44 kg/m. Wt Readings from Last 3 Encounters:  01/26/22 171 lb 12.8 oz (77.9 kg)  01/09/22 167 lb (75.8 kg)  11/14/21 169 lb (76.7 kg)   Temp Readings from Last 3 Encounters:  01/26/22 98 F (36.7 C) (Oral)  11/14/21 (!) 97 F (36.1 C) (Temporal)  06/06/21 98.1 F (36.7 C)   BP Readings from Last 3 Encounters:  01/26/22 130/64  11/14/21  110/70  09/14/21 133/81   Pulse Readings from Last 3 Encounters:  01/26/22 74  11/14/21 81  09/14/21 76    Physical Exam Vitals and nursing note reviewed.  Constitutional:      Appearance: Normal appearance. She is well-developed and well-groomed.  HENT:     Head: Normocephalic and atraumatic.  Eyes:     Conjunctiva/sclera: Conjunctivae normal.     Pupils: Pupils are equal, round, and reactive to light.  Cardiovascular:     Rate and Rhythm: Normal rate and regular rhythm.     Heart sounds: Normal heart sounds. No murmur heard. Pulmonary:     Effort: Pulmonary effort is normal.  Breath sounds: Normal breath sounds.  Abdominal:     General: Abdomen is flat. Bowel sounds are normal.     Tenderness: There is no abdominal tenderness.  Musculoskeletal:        General: No tenderness.  Skin:    General: Skin is warm and dry.  Neurological:     General: No focal deficit present.     Mental Status: She is alert and oriented to person, place, and time. Mental status is at baseline.     Cranial Nerves: Cranial nerves 2-12 are intact.     Gait: Gait is intact.  Psychiatric:        Attention and Perception: Attention and perception normal.        Mood and Affect: Mood and affect normal.        Speech: Speech normal.        Behavior: Behavior normal. Behavior is cooperative.        Thought Content: Thought content normal.        Cognition and Memory: Cognition and memory normal.        Judgment: Judgment normal.    Assessment  Plan  Iron deficiency anemia, unspecified iron deficiency anemia type - Plan: Ambulatory referral to Hematology / Oncology further w/u  On oral iron Consider capsule study Dr. Vicente Males if normal h/o w/u  Egd/colonoscopy utd  S/p hysterectomy  On aspirin and plavix for cad   Abnormal gamma globulin level - Plan: Ambulatory referral to Hematology / Oncology  Lymphadenopathy of head and neck - Plan: US SOFT TISSUE HEAD & NECK (NON-THYROID)   HM Flu shot  utd 08/2021 prevnar utd  pna 23 utd  Tdap utd 06/25/18 covid vx had 2/2 declines further as of 11/15//22 note had covid shingrix 2/2    mammo 11/11/19 negative ordered sch 01/11/21 [place] 12/2021   DEXA 11/11/19 normal    Colonoscopy 08/30/17 normal Dr. Silvio Pate h/o colitis, 2021 established Dr. Vicente Males colonoscopy EGD sch 03/25/20    Pap no longer needed  S/p hysterectomy no h/o abnormal  Endometriosis total hysterectomy    SPEP abnormal consider nephrotic proteinuria consider renal in future 10/15/2019 no abnormal bands but low urine creatinine and protein    [facility] Dr. Steffanie Dunn appt 02/19/20 due to f/u 07/2020, 01/2021  Cards Dr. Gerrit Halls seen 03/10/20  [facility]-migraines  Dermatology Dr. Laurence Ferrari saw 12/2019 Rheumatology Dr. Estanislado Pandy  Hand surgery Dr. Fredna Dow Vascular Dr. Lucky Cowboy  Retinal specialist Dr. Tempie Hoist  GI: Dr. Jonathon Bellows      Provider: Dr. Olivia Mackie [facility]

## 2022-01-26 NOTE — Patient Instructions (Signed)
Dr. Janese Banks  Phone Fax E-mail Address  [phone] Not available [place] [address]     Specialties

## 2022-01-27 ENCOUNTER — Other Ambulatory Visit: Payer: Self-pay | Admitting: Physician Assistant

## 2022-01-29 ENCOUNTER — Telehealth: Payer: Self-pay

## 2022-01-29 ENCOUNTER — Other Ambulatory Visit: Payer: Self-pay

## 2022-01-29 MED ORDER — LEFLUNOMIDE 20 MG PO TABS
ORAL_TABLET | Freq: Every day | ORAL | 0 refills | Status: DC
  Filled 2022-01-29: qty 90, 90d supply, fill #0

## 2022-01-29 NOTE — Telephone Encounter (Signed)
Next Visit: 02/20/2022   Last Visit: 09/14/2021    Last Fill: 11/14/2021   DX: Rheumatoid arthritis of multiple sites with negative rheumatoid factor    Current Dose per office note 09/14/2021: Arava 20 mg 1 tablet by mouth daily   Labs: 01/11/2022 Hgb 11.9, Hct 34.8,  Glucose 108   Okay to refill Arava?

## 2022-01-29 NOTE — Telephone Encounter (Signed)
I have submitted a PA request for Ubrelvy 100mg  on CMM, Key: [id number] - PA Case ID: [id number]. Awaiting determination from [employer].

## 2022-01-30 ENCOUNTER — Other Ambulatory Visit: Payer: Self-pay

## 2022-01-30 MED ORDER — DEXCOM G6 SENSOR MISC
99 refills | Status: DC
  Filled 2022-01-30 – 2022-01-31 (×3): qty 9, 90d supply, fill #0

## 2022-01-30 MED ORDER — DEXCOM G6 TRANSMITTER MISC
99 refills | Status: DC
  Filled 2022-01-30: qty 1, 90d supply, fill #0

## 2022-01-30 MED ORDER — DEXCOM G6 RECEIVER DEVI
0 refills | Status: DC
  Filled 2022-01-30: qty 1, 30d supply, fill #0

## 2022-01-30 MED ORDER — SKIN TAC ADHESIVE BARRIER WIPE MISC: 99 refills | Status: DC

## 2022-01-31 ENCOUNTER — Other Ambulatory Visit: Payer: Self-pay

## 2022-02-01 ENCOUNTER — Ambulatory Visit
Admission: RE | Admit: 2022-02-01 | Discharge: 2022-02-01 | Disposition: A | Discharge status: Home / Self Care | Payer: 59 | Source: Ambulatory Visit | Attending: Internal Medicine | Admitting: Internal Medicine

## 2022-02-01 ENCOUNTER — Other Ambulatory Visit: Payer: Self-pay

## 2022-02-01 DIAGNOSIS — R591 Generalized enlarged lymph nodes: Secondary | ICD-10-CM | POA: Insufficient documentation

## 2022-02-01 DIAGNOSIS — R590 Localized enlarged lymph nodes: Secondary | ICD-10-CM | POA: Diagnosis not present

## 2022-02-01 DIAGNOSIS — M542 Cervicalgia: Secondary | ICD-10-CM | POA: Diagnosis not present

## 2022-02-01 NOTE — Telephone Encounter (Signed)
PA approved for the pt from 01/31/2022-01/30/2023 through [employer]

## 2022-02-02 ENCOUNTER — Encounter: Payer: Self-pay | Admitting: *Deleted

## 2022-02-05 ENCOUNTER — Other Ambulatory Visit: Payer: Self-pay

## 2022-02-05 MED ORDER — OZEMPIC (0.25 OR 0.5 MG/DOSE) 2 MG/1.5ML ~~LOC~~ SOPN
PEN_INJECTOR | SUBCUTANEOUS | 5 refills | Status: DC
  Filled 2022-02-05: qty 4.5, 84d supply, fill #0

## 2022-02-06 ENCOUNTER — Encounter: Payer: Self-pay | Admitting: Oncology

## 2022-02-06 ENCOUNTER — Inpatient Hospital Stay: Payer: 59 | Attending: Oncology | Admitting: Oncology

## 2022-02-06 ENCOUNTER — Other Ambulatory Visit: Payer: Self-pay

## 2022-02-06 ENCOUNTER — Inpatient Hospital Stay: Payer: 59

## 2022-02-06 VITALS — BP 112/68 | HR 74 | Temp 96.4°F | Resp 16 | Ht 62.99 in | Wt 168.0 lb

## 2022-02-06 DIAGNOSIS — E109 Type 1 diabetes mellitus without complications: Secondary | ICD-10-CM

## 2022-02-06 DIAGNOSIS — M069 Rheumatoid arthritis, unspecified: Secondary | ICD-10-CM | POA: Diagnosis not present

## 2022-02-06 DIAGNOSIS — D801 Nonfamilial hypogammaglobulinemia: Secondary | ICD-10-CM | POA: Insufficient documentation

## 2022-02-06 DIAGNOSIS — D509 Iron deficiency anemia, unspecified: Secondary | ICD-10-CM | POA: Diagnosis not present

## 2022-02-06 DIAGNOSIS — E1042 Type 1 diabetes mellitus with diabetic polyneuropathy: Secondary | ICD-10-CM | POA: Diagnosis not present

## 2022-02-06 DIAGNOSIS — Z79899 Other long term (current) drug therapy: Secondary | ICD-10-CM | POA: Diagnosis not present

## 2022-02-07 ENCOUNTER — Encounter: Payer: Self-pay | Admitting: Oncology

## 2022-02-07 NOTE — Progress Notes (Addendum)
Hematology/Oncology Consult note [facility] [phone] [phone]  Patient Care Team: McLean-Scocuzza, Nino Glow, MD as PCP - General (Internal Medicine) Sherren Mocha, MD as PCP - Cardiology (Cardiology) Susette Racer, MD Marcial Pacas, MD as Consulting Physician (Neurology) Marcial Pacas, MD as Referring Physician (Neurology) Sindy Guadeloupe, MD as Consulting Physician (Hematology and Oncology)   Name of the patient: [name]  [id number]  [birth year]    Reason for referral-anemia   Referring physician-Dr. Mclean-Scocuzza  Date of visit: 02/07/22   History of presenting illness- Patient is a 56 year old female with past medical history significant for obstructive sleep apnea, type 1 diabetes, hypertension, rheumatoid arthritis among other medical problems.  She has been referred for anemia.  Her most recent CBC from 01/11/2022 showed white count of 4.6, H&H of 11.9/34.8 with an MCV of 88 and a platelet count of 222.  Iron studies showed a low iron saturation of 16% and a ferritin of 48 with a TIBC that was normal at 361.  TSH normal.  Patient has started taking oral iron about 2 to 3 weeks ago.  She denies any bleeding in her stool or urine.  She has had EGD and colonoscopy by Dr. Vicente Males in March 2021.  EGD was normal colonoscopy was also normal and a repeat colonoscopy was recommended in 5 years  ECOG PS- 1  Pain scale- 0   Review of systems- Review of Systems  Constitutional:  Positive for malaise/fatigue. Negative for chills, fever and weight loss.  HENT:  Negative for congestion, ear discharge and nosebleeds.   Eyes:  Negative for blurred vision.  Respiratory:  Negative for cough, hemoptysis, sputum production, shortness of breath and wheezing.   Cardiovascular:  Negative for chest pain, palpitations, orthopnea and claudication.  Gastrointestinal:  Negative for abdominal pain, blood in stool, constipation, diarrhea, heartburn, melena,  nausea and vomiting.  Genitourinary:  Negative for dysuria, flank pain, frequency, hematuria and urgency.  Musculoskeletal:  Negative for back pain, joint pain and myalgias.  Skin:  Negative for rash.  Neurological:  Negative for dizziness, tingling, focal weakness, seizures, weakness and headaches.  Endo/Heme/Allergies:  Does not bruise/bleed easily.  Psychiatric/Behavioral:  Negative for depression and suicidal ideas. The patient does not have insomnia.    Allergies  Allergen Reactions   Actemra [Tocilizumab]    Prochlorperazine Rash    Neuro problems per pt   Ramipril Swelling, Rash and Other (See Comments)   Shellfish-Derived Products Swelling    Shrimp    Atorvastatin Rash    Elevated LFT's   Compazine  [Prochlorperazine Edisylate] Other (See Comments)    Neurological reaction   Emgality [Galcanezumab-Gnlm] Hives and Swelling   Etanercept Swelling and Rash   Infliximab Rash   Iohexol     Iv contrast dye -rash all over   Orencia [Abatacept] Rash   Prochlorperazine Edisylate     unknown   Shellfish Allergy Swelling    Shrimp(Facial swelling)   Tofacitinib Rash and Other (See Comments)    Severe abdominal pain    Tro[employer]]     Brain fog, memory issues, word finding issues   Tramadol     Nausea    Amiodarone Nausea Only   Rituximab Rash    Causes a rash    Patient Active Problem List   Diagnosis Date Noted   Iron deficiency anemia 01/16/2022   Right ear pain 01/09/2022   Sore throat 01/09/2022   OSA on CPAP 08/02/2021   Abnormal MRI,  lumbar spine 03/20/2021   Abnormal MRI, thoracic spine 03/20/2021   Chronic mid back pain 03/17/2021   Chronic midline low back pain with left-sided sciatica 03/17/2021   Type 1 diabetes mellitus with diabetic polyneuropathy ([employer]) 02/17/2021   Severe obstructive sleep apnea-hypopnea syndrome 02/17/2021   Chronic diastolic CHF (congestive heart failure) ([place]) 12/19/2020   Chronic back pain 12/19/2020   Chronic  abdominal pain 05/10/210   Acute diastolic heart failure ([place]) 12/09/2020   Diabetic retinopathy associated with type 1 diabetes mellitus ([place]) 09/27/2020   Diabetic retinopathy ([place]) 09/27/2020   Graves' disease 09/27/2020   Chronic coronary artery disease 09/27/2020   Hypertension associated with diabetes ([place]) 09/14/2020   Obesity (BMI 30-39.9) 09/14/2020   Annual physical exam 09/14/2020   Aortic atherosclerosis ([place]) 09/14/2020   Snoring 05/12/2020   Lung nodule 03/10/2020   Colitis 03/10/2020   Type 1 diabetes mellitus with proliferative retinopathy ([place]) 12/10/2019   Abnormal thyroid function test 12/10/2019   Bruxism (teeth grinding)    Chronic migraine 10/07/2019   Contusion of left hip 07/25/2019   H/O multiple allergies 03/10/2019   Hx of anaphylaxis 03/10/2019   Cough 06/26/2018   PND (post-nasal drip) 06/26/2018   Lumbar strain, initial encounter 04/10/2018   Thoracic myofascial strain 04/10/2018   Vitamin D deficiency 07/04/2017   B12 deficiency 07/04/2017   Abnormal CT scan 06/19/2017   Gastroesophageal reflux disease 06/19/2017   Antiplatelet or antithrombotic long-term use 06/19/2017   Sinus congestion 03/15/2017   Graves disease 02/01/2017   Sleep difficulties 02/01/2017   No energy 02/01/2017   Osteopenia 02/01/2017   Bilateral hand pain 07/16/2016   Acquired trigger finger 07/16/2016   Hypertension, essential 05/08/2016   Foot pain, right 08/30/2014   Shoulder pain, right 08/30/2014   Chronic pain syndrome 01/15/2014   Multiple joint pain 01/15/2014   Lesion of lower eyelid 04/21/2013   Generalized constriction of visual field 03/31/2013   Neoplasm of uncertain behavior of skin of eyelid 03/31/2013   Posterior capsular opacification 03/31/2013   Cyst of left lower eyelid 03/30/2013   Pseudophakia of both eyes 03/30/2013   Prurigo nodularis 02/05/2013   Stasis dermatitis 02/05/2013   Psoriasis 01/21/2013   Trochanteric bursitis 11/10/2012    Achilles tendinitis 07/11/2012   Epiretinal membrane 06/17/2012   Status post cataract extraction 04/30/2012   Pes equinus, acquired 04/23/2012   Tendinitis of ankle 04/23/2012   Proliferative diabetic retinopathy of both eyes ([place]) 01/11/2012   Ongoing use of possibly toxic medication 12/05/2011   Hyperthyroidism 11/09/2010   SINUS TACHYCARDIA 11/08/2010   DERMATITIS, ALLERGIC 07/20/2010   EDEMA 07/12/2010   Dizziness 11/14/2009   ATRIAL FIBRILLATION 07/20/2009   Hx of CABG 06/07/2009   ANGINA, STABLE/EXERTIONAL 06/02/2009   Dyslipidemia, goal LDL below 70 04/26/2009   ACUT MI ANTEROLAT WALL SUBSQT EPIS CARE 04/26/2009   Chest pain 04/26/2009   DIABETIC  RETINOPATHY 04/25/2009   CARPAL TUNNEL SYNDROME, BILATERAL 04/25/2009   TRIGGER FINGER 04/25/2009   MIGRAINE W/O AURA W/INTRACT W/STATUS MIGRAINOSUS 02/19/2008   ALLERGIC RHINITIS, SEASONAL 10/16/2006   CHOLELITHIASIS 10/16/2006   Rheumatoid arthritis ([place]) 10/16/2006     Past Medical History:  Diagnosis Date   ACUT MI ANTEROLAT WALL SUBSQT EPIS CARE    Acute maxillary sinusitis    ALLERGIC RHINITIS, SEASONAL    ARTHRITIS, RHEUMATOID    shoulders and hands Enbrel>leg swelling Dr. Estanislado Pandy   Atrial fibrillation ([place])    a. after CABG.   Back pain    Bruxism (teeth grinding)  CAD, ARTERY BYPASS GRAFT    a. DES to RCA in 2010 then LAD occlusion s/p CABG 3 06/07/2009 with LIMA to LAD, reverse SVG to D1, reverse SVG to distal RCA. b. Cath 05/08/2016 slightly hypodense region in the intermediate branch, however she had excellent flow, FFR was normal. Vein graft to PDA and the posterolateral branch is patent, patent LIMA to LAD, occluded SVG to diagonal.   CARPAL TUNNEL SYNDROME, BILATERAL    CHOLELITHIASIS    Contrast media allergy    DERMATITIS, ALLERGIC    DIABETES MELLITUS, TYPE I    on insulin pump dx'ed age 36 y.o    DIABETIC  RETINOPATHY    Hiatal hernia    HYPERLIPIDEMIA-MIXED    HYPERTHYROIDISM    Dr. Dollene [place]   IDA (iron deficiency anemia)    Insulin pump in place    Lymphadenopathy of head and neck    MIGRAINE W/O AURA W/INTRACT W/STATUS MIGRAINOSUS 02/19/2008   PONV (postoperative nausea and vomiting)    Psoriasis    SINUS TACHYCARDIA 11/08/2010   Sleep apnea    not using machine yet    SVT (supraventricular tachycardia) ([employer])    after s/p CABG   TRIGGER FINGER    all fingers b/l hands    URI      Past Surgical History:  Procedure Laterality Date   ABDOMINAL HYSTERECTOMY     endometriomas b/l; total ? cervix removed; no h/o abnormal pap   Caesarean section     CARDIAC CATHETERIZATION N/A 05/08/2016   Procedure: Left Heart Cath and Cors/Grafts Angiography;  Surgeon: Burnell Blanks, MD;  Location: [facility];  Service: Cardiovascular;  Laterality: N/A;   CARPAL TUNNEL RELEASE     b/l    CARPAL TUNNEL RELEASE Left 06/06/2021   Procedure: CARPAL TUNNEL RELEASE;  Surgeon: Daryll Brod, MD;  Location: [place];  Service: Orthopedics;  Laterality: Left;  AXILLARY BLOCK   CATARACT EXTRACTION, BILATERAL     CHOLECYSTECTOMY     COLONOSCOPY WITH PROPOFOL N/A 03/25/2020   Procedure: COLONOSCOPY WITH PROPOFOL;  Surgeon: Jonathon Bellows, MD;  Location[facility] END[facility];  Service: Gastroenterology;  Laterality: N/A;   CORONARY ARTERY BYPASS GRAFT     ESOPHAGOGASTRODUODENOSCOPY (EGD) WITH PROPOFOL N/A 03/25/2020   Procedure: ESOPHAGOGASTRODUODENOSCOPY (EGD) WITH PROPOFOL;  Surgeon: Jonathon Bellows, MD;  Location: [facility] ENDOSCOPY;  Service: Gastroenterology;  Laterality: N/A;   EYE SURGERY     laser x 2 for retinopathy    LEFT HEART CATH AND CORS/GRAFTS ANGIOGRAPHY N/A 02/22/2020   Procedure: LEFT HEART CATH AND CORS/GRAFTS ANGIOGRAPHY;  Surgeon: Burnell Blanks, MD;  Location: [place] [facility];  Service: Cardiovascular;  Laterality: N/A;   TRIGGER FINGER RELEASE     b/l fingers all    ULNAR NERVE TRANSPOSITION Left 06/06/2021   Procedure: ULNAR NERVE  DECOMPRESSION;  Surgeon: Daryll Brod, MD;  Location: [place];  Service: Orthopedics;  Laterality: Left;  AXILLARY BLOCK   VITRECTOMY     b/l     Social History   Socioeconomic History   Marital status: Divorced    Spouse name: Not on file   Number of children: Not on file   Years of education: Not on file   Highest education level: Not on file  Occupational History   Not on file  Tobacco Use   Smoking status: Never   Smokeless tobacco: Never  Vaping Use   Vaping Use: Never used  Substance and Sexual Activity  Alcohol use: Yes    Comment: rarely 1 every 6 months   Drug use: No   Sexual activity: Not Currently    Partners: Male    Birth control/protection: None  Other Topics Concern   Not on file  Social History Narrative   Divorced. Has 2 kids(fraternal twins) daughters age 25. Works at [employer], Never smoked, denies ETOH, no drugs. Drinks diet coke. No exercise.       DPR daughters [name] and Soraya Paquette twins          Social Determinants of Health   Financial Resource Strain: Not on file  Food Insecurity: Not on file  Transportation Needs: Not on file  Physical Activity: Not on file  Stress: Not on file  Social Connections: Not on file  Intimate Partner Violence: Not on file     Family History  Problem Relation Age of Onset   Depression Mother    Anxiety disorder Mother    Colon polyps Father    Diabetes Father        2   Hypertension Father    Parkinson's disease Father    Stroke Paternal Grandmother    Heart disease Other    Hypertension Other    Hyperlipidemia Other    Depression Other    Migraines Other    Heart attack Neg Hx    Colon cancer Neg Hx    Stomach cancer Neg Hx    Esophageal cancer Neg Hx      Current Outpatient Medications:    aspirin EC 81 MG tablet, Take 81 mg by mouth daily., Disp: , Rfl:    clopidogrel (PLAVIX) 75 MG tablet, TAKE 1 TABLET BY MOUTH ONCE DAILY, Disp: 90 tablet, Rfl: 3    Continuous Blood Gluc Receiver (DEXCOM G6 RECEIVER) DEVI, Dispense and use as directed, Disp: 1 each, Rfl: 0   Continuous Blood Gluc Sensor (DEXCOM G6 SENSOR) MISC, Dispense and use as directed.  Change sensor every 10 days., Disp: 9 each, Rfl: 99   Continuous Blood Gluc Transmit (DEXCOM G6 TRANSMITTER) MISC, Dispense and use as directed.  Change transmitter every 90 days., Disp: 1 each, Rfl: 99   cyclobenzaprine (FLEXERIL) 10 MG tablet, TAKE ONE TABLET BY MOUTH AT BEDTIME., Disp: 30 tablet, Rfl: 2   EPINEPHrine 0.3 mg/0.3 mL IJ SOAJ injection, Inject 0.3 mg into the muscle as needed for anaphylaxis., Disp: 2 each, Rfl: 2   glucose blood (CONTOUR NEXT TEST) test strip, USE TO CHECK BLOOD SUGAR 10 TIMES DAILY, Disp: 300 each, Rfl: 5   insulin lispro (HUMALOG) 100 UNIT/ML injection, INJECT UP TO 90 UNITS DAILY IN INSULIN PUMP, Disp: 30 mL, Rfl: 5   Insulin Syringe-Needle U-100 (BD INSULIN SYRINGE U/F) 31G X 5/16" 0.3 ML MISC, use with insulin injections 3 times daily in the event of insulin pump failure., Disp: 100 each, Rfl: 12   Iron, Ferrous Sulfate, 325 (65 Fe) MG TABS, Take 325 mg by mouth daily., Disp: 90 tablet, Rfl: 0   isosorbide mononitrate (IMDUR) 60 MG 24 hr tablet, TAKE 1 TABLET BY MOUTH DAILY., Disp: 90 tablet, Rfl: 3   Krill Oil 500 MG CAPS, Take 500 mg by mouth daily., Disp: , Rfl:    leflunomide (ARAVA) 20 MG tablet, TAKE 1 TABLET BY MOUTH DAILY, Disp: 90 tablet, Rfl: 0   linaclotide (LINZESS) 290 MCG CAPS capsule, Take 1 capsule (290 mcg total) by mouth daily before breakfast., Disp: 90 capsule, Rfl: 3   losartan (COZAAR) 100 MG  tablet, TAKE 1 TABLET BY MOUTH DAILY, Disp: 90 tablet, Rfl: 3   metoprolol tartrate (LOPRESSOR) 50 MG tablet, Take 0.5 tablets (25 mg total) by mouth 2 (two) times daily., Disp: 90 tablet, Rfl: 3   mupirocin ointment (BACTROBAN) 2 %, Apply topically 3 (three) times daily for 7 days (Patient taking differently: Apply 1 application topically 2 (two) times daily  as needed.), Disp: 30 g, Rfl: 1   ondansetron (ZOFRAN-ODT) 4 MG disintegrating tablet, Take 4 mg by mouth every 8 (eight) hours as needed., Disp: , Rfl:    ONE TOUCH ULTRA TEST test strip, , Disp: , Rfl: 1   pantoprazole (PROTONIX) 40 MG tablet, Take 1 tablet (40 mg total) by mouth 2 (two) times daily., Disp: 180 tablet, Rfl: 3   potassium chloride (KLOR-CON) 10 MEQ tablet, Take 1 tablet (10 mEq total) by mouth daily., Disp: 90 tablet, Rfl: 3   promethazine (PHENERGAN) 25 MG tablet, TAKE 1/2- 1 TABLETS (12.5-25 MG TOTAL) BY MOUTH 2 TIMES DAILY AS NEEDED FOR NAUSEA OR VOMITING., Disp: 60 tablet, Rfl: 2   rosuvastatin (CRESTOR) 20 MG tablet, Take 1 tablet (20 mg total) by mouth daily., Disp: 90 tablet, Rfl: 2   Semaglutide,0.25 or 0.5MG /DOS, (OZEMPIC, 0.25 OR 0.5 MG/DOSE,) 2 MG/1.5ML SOPN, Inject one half mg into the skin once a week., Disp: 1.5 mL, Rfl: 5   torsemide (DEMADEX) 20 MG tablet, Take 1 tablet (20 mg total) by mouth daily. Stop Lasix. Please call our office to schedule an appointment for any future refills Thank you. [phone]. 1st attempt, Disp: 30 tablet, Rfl: 0   Ubrogepant (UBRELVY) 100 MG TABS, Take 1 tablet by mouth as needed, Disp: 12 tablet, Rfl: 0   Upadacitinib ER (RINVOQ) 15 MG TB24, Take 1 tablet (15 mg) by mouth daily., Disp: 30 tablet, Rfl: 2   CHELATED MAGNESIUM PO, Take 250 mg by mouth daily. (Patient not taking: Reported on 02/06/2022), Disp: , Rfl:    dicyclomine (BENTYL) 20 MG tablet, Take 1 tablet (20 mg total) by mouth 3 (three) times daily as needed for spasms (abdominal pain). (Patient not taking: Reported on 09/14/2021), Disp: 270 tablet, Rfl: 0   ipratropium (ATROVENT) 0.06 % nasal spray, Place 2 sprays into both nostrils 4 (four) times daily as needed for rhinitis. (Patient not taking: Reported on 02/06/2022), Disp: 15 mL, Rfl: 12   Physical exam:  Vitals:   02/07/22 0851  BP: 112/68  Pulse: 74  Resp: 16  Temp: (!) 96.4 F (35.8 C)  TempSrc: Tympanic   Weight: 168 lb (76.2 kg)  Height: 5' 2.99" (1.6 m)   Physical Exam Constitutional:      General: She is not in acute distress. Cardiovascular:     Rate and Rhythm: Normal rate and regular rhythm.     Heart sounds: Normal heart sounds.  Pulmonary:     Effort: Pulmonary effort is normal.     Breath sounds: Normal breath sounds.  Abdominal:     General: Bowel sounds are normal.     Palpations: Abdomen is soft.  Skin:    General: Skin is warm and dry.  Neurological:     Mental Status: She is alert and oriented to person, place, and time.       CMP Latest Ref Rng & Units 01/11/2022  Glucose 70 - 99 mg/dL 108(H)  BUN 6 - 23 mg/dL 10  Creatinine 0.40 - 1.20 mg/dL 0.90  Sodium 135 - 145 mEq/L 140  Potassium 3.5 - 5.1 mEq/L 3.5  Chloride 96 - 112 mEq/L 101  CO2 19 - 32 mEq/L 28  Calcium 8.4 - 10.5 mg/dL 8.8  Total Protein 6.0 - 8.3 g/dL 6.4  Total Bilirubin 0.2 - 1.2 mg/dL 0.4  Alkaline Phos 39 - 117 U/L 80  AST 0 - 37 U/L 20  ALT 0 - 35 U/L 16   CBC Latest Ref Rng & Units 01/11/2022  WBC 4.0 - 10.5 K/uL 4.6  Hemoglobin 12.0 - 15.0 g/dL 11.9(L)  Hematocrit 36.0 - 46.0 % 34.8(L)  Platelets 150.0 - 400.0 K/uL 222.0    No images are attached to the encounter.  US SOFT TISSUE HEAD & NECK (NON-THYROID)  Result Date: 02/03/2022 CLINICAL DATA:  Lymphadenopathy of the head neck pain EXAM: ULTRASOUND OF HEAD/NECK SOFT TISSUES TECHNIQUE: Ultrasound examination of the head and neck soft tissues was performed in the area of clinical concern. COMPARISON:  None. FINDINGS: Ultrasound evaluation of the neck demonstrates no discrete sonographic abnormality. IMPRESSION: Ultrasound evaluation of the neck demonstrates no discrete sonographic abnormality. If the patient's symptoms continue, further evaluation with contrast-enhanced neck CT should be considered. Electronically Signed   By: Miachel Roux M.D.   On: 02/03/2022 06:44   MM 3D SCREEN BREAST BILATERAL  Result Date: 01/16/2022 CLINICAL  DATA:  Screening. EXAM: DIGITAL SCREENING BILATERAL MAMMOGRAM WITH TOMOSYNTHESIS AND CAD TECHNIQUE: Bilateral screening digital craniocaudal and mediolateral oblique mammograms were obtained. Bilateral screening digital breast tomosynthesis was performed. The images were evaluated with computer-aided detection. COMPARISON:  Previous exam(s). ACR Breast Density Category b: There are scattered areas of fibroglandular density. FINDINGS: There are no findings suspicious for malignancy. IMPRESSION: No mammographic evidence of malignancy. A result letter of this screening mammogram will be mailed directly to the patient. RECOMMENDATION: Screening mammogram in one year. (Code:SM-B-01Y) BI-RADS CATEGORY  1: Negative. Electronically Signed   By: Everlean Alstrom M.D.   On: 01/16/2022 16:27   Assessment and plan- Patient is a 56 y.o. female referred for normocytic anemia  Patient's hemoglobin at baseline runs around 12.  She was noted to have mild anemia in January 2023 when her hemoglobin dropped down to 11.9.  MCV was normal.  Ferritin normal at 48 although iron saturation mildly low at 16%.  TIBC was normal.  She probably has an element of mild iron deficiency.  She does not require IV iron at this time and I would recommend that she should continue taking oral iron.  Patient also had SPEP done which did not show any evidence of M protein but showed mild hypogammaglobulinemia.  This can be seen in the setting of autoimmune disorders such as rheumatoid arthritis and type 1 diabetes as well.  I will check CBC ferritin iron studies B12 folate myeloma panel and serum free light chains in 3 months Followed by in person or video visit thereafter   Thank you for this kind referral and the opportunity to participate in the care of this patient   Visit Diagnosis 1. Iron deficiency anemia, unspecified iron deficiency anemia type     Dr. Randa Evens, MD, MPH [facility] [phone] 02/07/2022

## 2022-02-09 NOTE — Progress Notes (Signed)
Office Visit Note  Patient: [name]             Date of Birth: 1966/02/03           MRN: [id number]             PCP: McLean-Scocuzza, Nino Glow, MD Referring: Orland Mustard * Visit Date: 02/20/2022 Occupation: @GUAROCC @  Subjective:  Pain in multiple joints   History of Present Illness: [name] is a 56 y.o. female with history of seronegative rheumatoid arthritis and DDD.  She is taking rinvoq 15 mg 1 tablet by mouth daily and arava 20 mg 1 tablet by mouth daily.  She is tolerating both medications without any side effects and has not missed any doses recently.  She has not had any recent infections.  She has been experiencing increased arthralgias and joint stiffness for the past 2 to 3 weeks.  She has had increased discomfort in her right hip and stated it is painful for her to sit for long periods of time especially while in the car.  She denies any injury or fall prior to the onset of symptoms.  She denies any groin pain.  She is also been having increased pain in both hands and both knee joints.  She has noticed intermittent swelling in her hands and wrist joints.  She has been taking ibuprofen sparingly for pain relief. She denies any recent infections.     Activities of Daily Living:  Patient reports morning stiffness for all day. Patient Reports nocturnal pain.  Difficulty dressing/grooming: Denies Difficulty climbing stairs: Reports Difficulty getting out of chair: Reports Difficulty using hands for taps, buttons, cutlery, and/or writing: Denies  Review of Systems  Constitutional:  Positive for fatigue.  HENT:  Negative for mouth sores, mouth dryness and nose dryness.   Eyes:  Positive for dryness. Negative for pain and itching.  Respiratory:  Negative for shortness of breath and difficulty breathing.   Cardiovascular:  Negative for chest pain and palpitations.  Gastrointestinal:  Positive for constipation. Negative for diarrhea.  Endocrine: Negative  for increased urination.  Genitourinary:  Negative for difficulty urinating.  Musculoskeletal:  Positive for joint pain, joint pain, joint swelling and morning stiffness. Negative for myalgias, muscle tenderness and myalgias.  Skin:  Negative for color change, rash and redness.  Allergic/Immunologic: Negative for susceptible to infections.  Neurological:  Positive for headaches. Negative for dizziness, numbness, memory loss and weakness.  Hematological:  Positive for bruising/bleeding tendency.  Psychiatric/Behavioral:  Negative for confusion.    PMFS History:  Patient Active Problem List   Diagnosis Date Noted   Iron deficiency anemia 01/16/2022   Right ear pain 01/09/2022   Sore throat 01/09/2022   OSA on CPAP 08/02/2021   Abnormal MRI, lumbar spine 03/20/2021   Abnormal MRI, thoracic spine 03/20/2021   Chronic mid back pain 03/17/2021   Chronic midline low back pain with left-sided sciatica 03/17/2021   Type 1 diabetes mellitus with diabetic polyneuropathy ([place]) 02/17/2021   Severe obstructive sleep apnea-hypopnea syndrome 02/17/2021   Chronic diastolic CHF (congestive heart failure) ([place]) 12/19/2020   Chronic back pain 12/19/2020   Chronic abdominal pain 51/76/1607   Acute diastolic heart failure ([place]) 12/09/2020   Diabetic retinopathy associated with type 1 diabetes mellitus ([place]) 09/27/2020   Diabetic retinopathy ([place]) 09/27/2020   Graves' disease 09/27/2020   Chronic coronary artery disease 09/27/2020   Hypertension associated with diabetes ([place]) 09/14/2020   Obesity (BMI 30-39.9) 09/14/2020   Annual physical exam  09/14/2020   Aortic atherosclerosis ([place]) 09/14/2020   Snoring 05/12/2020   Lung nodule 03/10/2020   Colitis 03/10/2020   Type 1 diabetes mellitus with proliferative retinopathy ([place]) 12/10/2019   Abnormal thyroid function test 12/10/2019   Bruxism (teeth grinding)    Chronic migraine 10/07/2019   Contusion of left hip 07/25/2019   H/O multiple allergies  03/10/2019   Hx of anaphylaxis 03/10/2019   Cough 06/26/2018   PND (post-nasal drip) 06/26/2018   Lumbar strain, initial encounter 04/10/2018   Thoracic myofascial strain 04/10/2018   Vitamin D deficiency 07/04/2017   B12 deficiency 07/04/2017   Abnormal CT scan 06/19/2017   Gastroesophageal reflux disease 06/19/2017   Antiplatelet or antithrombotic long-term use 06/19/2017   Sinus congestion 03/15/2017   Graves disease 02/01/2017   Sleep difficulties 02/01/2017   No energy 02/01/2017   Osteopenia 02/01/2017   Bilateral hand pain 07/16/2016   Acquired trigger finger 07/16/2016   Hypertension, essential 05/08/2016   Foot pain, right 08/30/2014   Shoulder pain, right 08/30/2014   Chronic pain syndrome 01/15/2014   Multiple joint pain 01/15/2014   Lesion of lower eyelid 04/21/2013   Generalized constriction of visual field 03/31/2013   Neoplasm of uncertain behavior of skin of eyelid 03/31/2013   Posterior capsular opacification 03/31/2013   Cyst of left lower eyelid 03/30/2013   Pseudophakia of both eyes 03/30/2013   Prurigo nodularis 02/05/2013   Stasis dermatitis 02/05/2013   Psoriasis 01/21/2013   Trochanteric bursitis 11/10/2012   Achilles tendinitis 07/11/2012   Epiretinal membrane 06/17/2012   Status post cataract extraction 04/30/2012   Pes equinus, acquired 04/23/2012   Tendinitis of ankle 04/23/2012   Proliferative diabetic retinopathy of both eyes ([place]) 01/11/2012   Ongoing use of possibly toxic medication 12/05/2011   Hyperthyroidism 11/09/2010   SINUS TACHYCARDIA 11/08/2010   DERMATITIS, ALLERGIC 07/20/2010   EDEMA 07/12/2010   Dizziness 11/14/2009   ATRIAL FIBRILLATION 07/20/2009   Hx of CABG 06/07/2009   ANGINA, STABLE/EXERTIONAL 06/02/2009   Dyslipidemia, goal LDL below 70 04/26/2009   ACUT MI ANTEROLAT WALL SUBSQT EPIS CARE 04/26/2009   Chest pain 04/26/2009   DIABETIC  RETINOPATHY 04/25/2009   CARPAL TUNNEL SYNDROME, BILATERAL 04/25/2009   TRIGGER  FINGER 04/25/2009   MIGRAINE W/O AURA W/INTRACT W/STATUS MIGRAINOSUS 02/19/2008   ALLERGIC RHINITIS, SEASONAL 10/16/2006   CHOLELITHIASIS 10/16/2006   Rheumatoid arthritis ([place]) 10/16/2006    Past Medical History:  Diagnosis Date   ACUT MI ANTEROLAT WALL SUBSQT EPIS CARE    Acute maxillary sinusitis    ALLERGIC RHINITIS, SEASONAL    Anemia    ARTHRITIS, RHEUMATOID    shoulders and hands Enbrel>leg swelling Dr. Estanislado Pandy   Atrial fibrillation [facility])    a. after CABG.   Back pain    Bruxism (teeth grinding)    CAD, ARTERY BYPASS GRAFT    a. DES to RCA in 2010 then LAD occlusion s/p CABG 3 06/07/2009 with LIMA to LAD, reverse SVG to D1, reverse SVG to distal RCA. b. Cath 05/08/2016 slightly hypodense region in the intermediate branch, however she had excellent flow, FFR was normal. Vein graft to PDA and the posterolateral branch is patent, patent LIMA to LAD, occluded SVG to diagonal.   CARPAL TUNNEL SYNDROME, BILATERAL    CHOLELITHIASIS    Contrast media allergy    DERMATITIS, ALLERGIC    DIABETES MELLITUS, TYPE I    on insulin pump dx'ed age 3 y.o    DIABETIC  RETINOPATHY    Hiatal hernia  HYPERLIPIDEMIA-MIXED    HYPERTHYROIDISM    Dr. Dollene Cleveland   IDA (iron deficiency anemia)    Insulin pump in place    Lymphadenopathy of head and neck    MIGRAINE W/O AURA W/INTRACT W/STATUS MIGRAINOSUS 02/19/2008   PONV (postoperative nausea and vomiting)    Psoriasis    SINUS TACHYCARDIA 11/08/2010   Sleep apnea    not using machine yet    SVT (supraventricular tachycardia) ([facility])    after s/p CABG   TRIGGER FINGER    all fingers b/l hands    URI     Family History  Problem Relation Age of Onset   Depression Mother    Anxiety disorder Mother    Colon polyps Father    Diabetes Father        2   Hypertension Father    Parkinson's disease Father    Stroke Paternal Grandmother    Heart disease Other    Hypertension Other    Hyperlipidemia Other    Depression Other     Migraines Other    Heart attack Neg Hx    Colon cancer Neg Hx    Stomach cancer Neg Hx    Esophageal cancer Neg Hx    Past Surgical History:  Procedure Laterality Date   ABDOMINAL HYSTERECTOMY     endometriomas b/l; total ? cervix removed; no h/o abnormal pap   Caesarean section     CARDIAC CATHETERIZATION N/A 05/08/2016   Procedure: Left Heart Cath and Cors/Grafts Angiography;  Surgeon: Burnell Blanks, MD;  Location: [place] [facility];  Service: Cardiovascular;  Laterality: N/A;   CARPAL TUNNEL RELEASE     b/l    CARPAL TUNNEL RELEASE Left 06/06/2021   Procedure: CARPAL TUNNEL RELEASE;  Surgeon: Daryll Brod, MD;  Location: [place];  Service: Orthopedics;  Laterality: Left;  AXILLARY BLOCK   CATARACT EXTRACTION, BILATERAL     CHOLECYSTECTOMY     COLONOSCOPY WITH PROPOFOL N/A 03/25/2020   Procedure: COLONOSCOPY WITH PROPOFOL;  Surgeon: Jonathon Bellows, MD;  Location[facility] ENDOSCOPY;  Service: Gastroenterology;  Laterality: N/A;   CORONARY ARTERY BYPASS GRAFT     ESOPHAGOGASTRODUODENOSCOPY (EGD) WITH PROPOFOL N/A 03/25/2020   Procedure: ESOPHAGOGASTRODUODENOSCOPY (EGD) WITH PROPOFOL;  Surgeon: Jonathon Bellows, MD;  Location[facility] END[facility]COPY;  Service: Gastroenterology;  Laterality: N/A;   EYE SURGERY     laser x 2 for retinopathy    LEFT HEART CATH AND CORS/GRAFTS ANGIOGRAPHY N/A 02/22/2020   Procedure: LEFT HEART CATH AND CORS/GRAFTS ANGIOGRAPHY;  Surgeon: Burnell Blanks, MD;  Location: [place] [facility];  Service: Cardiovascular;  Laterality: N/A;   TRIGGER FINGER RELEASE     b/l fingers all    ULNAR NERVE TRANSPOSITION Left 06/06/2021   Procedure: ULNAR NERVE DECOMPRESSION;  Surgeon: Daryll Brod, MD;  Location: [place];  Service: Orthopedics;  Laterality: Left;  AXILLARY BLOCK   VITRECTOMY     b/l    Social History   Social History Narrative   Divorced. Has 2 kids(fraternal twins) daughters age 42. Works at [employer], Never  smoked, denies ETOH, no drugs. Drinks diet coke. No exercise.       DPR daughters [name] and Ilana Prezioso twins          Immunization History  Administered Date(s) Administered   Influenza Inj Mdck Quad With Preservative 09/30/2014   Influenza, Quadrivalent, Recombinant, Inj, Pf 10/14/2013   Influenza, Seasonal, Injecte, Preservative Fre 09/19/2016   Influenza,inj,Quad PF,6+ Mos 09/30/2014,  09/23/2017, 09/03/2018   Influenza,inj,quad, With Preservative 09/23/2017   Influenza-Unspecified 09/30/2014, 09/30/2016, 09/03/2018, 09/10/2019, 09/28/2020, 09/28/2021   PFIZER(Purple Top)SARS-COV-2 Vaccination 12/28/2019, 01/18/2020   Pneumococcal Conjugate-13 07/16/2016   Pneumococcal Polysaccharide-23 01/01/2004, 10/15/2006, 01/04/2020   Td 10/01/2003   Tdap 01/09/2011, 06/25/2018   Zoster Recombinat (Shingrix) 09/14/2020, 12/15/2020     Objective: Vital Signs: BP 122/78 (BP Location: Left Arm, Patient Position: Sitting, Cuff Size: Normal)    Pulse 81    Ht 5\' 3"  (1.6 m)    Wt 170 lb 6.4 oz (77.3 kg)    BMI 30.19 kg/m    Physical Exam Vitals and nursing note reviewed.  Constitutional:      Appearance: She is well-developed.  HENT:     Head: Normocephalic and atraumatic.  Eyes:     Conjunctiva/sclera: Conjunctivae normal.  Cardiovascular:     Rate and Rhythm: Normal rate and regular rhythm.     Heart sounds: Normal heart sounds.  Pulmonary:     Effort: Pulmonary effort is normal.     Breath sounds: Normal breath sounds.  Abdominal:     General: Bowel sounds are normal.     Palpations: Abdomen is soft.  Musculoskeletal:     Cervical back: Normal range of motion.  Lymphadenopathy:     Cervical: No cervical adenopathy.  Skin:    General: Skin is warm and dry.     Capillary Refill: Capillary refill takes less than 2 seconds.  Neurological:     Mental Status: She is alert and oriented to person, place, and time.  Psychiatric:        Behavior: Behavior normal.      Musculoskeletal Exam: C-spine, thoracic spine, and lumbar spine have good ROM.  Shoulder joints have slightly limited ROM with stiffness.  Elbow joint tenderness bilaterally.  Bilateral elbow joint contractures noted.  Wrist joints and MCPs have good ROM with no synovitis.  Tenderness over several MCP and PIP joints. Hip joints have good ROM with no groin pain.  No tenderness over trochanteric bursa. Tenderness over the right piriformis muscle.  Knee joints have good ROM with no warmth or effusion.  Ankle joints have good ROM with some tenderness over the right ankle.   CDAI Exam: CDAI Score: 6  Patient Global: 5 mm; Provider Global: 5 mm Swollen: 0 ; Tender: 5  Joint Exam 02/20/2022      Right  Left  MCP 1      Tender  MCP 2   Tender   Tender  PIP 3   Tender     PIP 5   Tender        Investigation: No additional findings.  Imaging: US SOFT TISSUE HEAD & NECK (NON-THYROID)  Result Date: 02/03/2022 CLINICAL DATA:  Lymphadenopathy of the head neck pain EXAM: ULTRASOUND OF HEAD/NECK SOFT TISSUES TECHNIQUE: Ultrasound examination of the head and neck soft tissues was performed in the area of clinical concern. COMPARISON:  None. FINDINGS: Ultrasound evaluation of the neck demonstrates no discrete sonographic abnormality. IMPRESSION: Ultrasound evaluation of the neck demonstrates no discrete sonographic abnormality. If the patient's symptoms continue, further evaluation with contrast-enhanced neck CT should be considered. Electronically Signed   By: Miachel Roux M.D.   On: 02/03/2022 06:44    Recent Labs: Lab Results  Component Value Date   WBC 4.6 01/11/2022   HGB 11.9 (L) 01/11/2022   PLT 222.0 01/11/2022   NA 140 01/11/2022   K 3.5 01/11/2022   CL 101 01/11/2022   CO2 28  01/11/2022   GLUCOSE 108 (H) 01/11/2022   BUN 10 01/11/2022   CREATININE 0.90 01/11/2022   BILITOT 0.4 01/11/2022   ALKPHOS 80 01/11/2022   AST 20 01/11/2022   ALT 16 01/11/2022   PROT 6.4 01/11/2022    ALBUMIN 4.4 01/11/2022   CALCIUM 8.8 01/11/2022   GFRAA 86 04/07/2021   QFTBGOLDPLUS NEGATIVE 10/31/2021    Speciality Comments: PLQ Eye Exam: 01/06/2020 ABNORMAL @ [facility]. Patient advised to discontinue PLQ at office visit on 01/06/2020.  Prior Therapy: methotrexate/Xeljanz/Actemra (GI upset), Enbrel (injection site reaction) and Orencia/Remicade/Humira/Cimzia/Rituxan/Simponi/Simponi Aria (inadequate response).  Procedures:  Piriformis-Right  Date/Time: 02/20/2022 3:58 PM Performed by: Bo Merino, MD Authorized by: Bo Merino, MD   Consent Given by:  Patient Site marked: the procedure site was marked   Timeout: prior to procedure the correct patient, procedure, and site was verified   Indications:  Pain Total # of Trigger Points:  1 Location: hip   Location comment:  Right piriformis Needle Size:  27 G Approach:  Dorsal Medications #1:  0.5 mL lidocaine 1 %; 30 mg triamcinolone acetonide 40 MG/ML Patient tolerance:  Patient tolerated the procedure well with no immediate complications Allergies: Actemra [tocilizumab], Ramipril, Shellfish-derived products, Atorvastatin, Compazine  [prochlorperazine edisylate], Emgality [galcanezumab-gnlm], Etanercept, Infliximab, Iohexol, Orencia [abatacept], Prochlorperazine edisylate, Tofacitinib, Trokendi xr [topiramate er], Tramadol, Amiodarone, and Rituximab    Assessment / Plan:     Visit Diagnoses: Rheumatoid arthritis of multiple sites with negative rheumatoid factor ([place]) - Methotrexate/Xeljanz/Actemra (GI upset), Enbrel (injection site reaction) and Orencia/Remicade/Humira/Cimzia/Rituxan/Simponi/Simponi Aria (inadequate response): She has no synovitis on examination today.  She presents today with increased pain in both hands and both knee joints for the past 2 weeks.  She has been taking Rinvoq 15 mg 1 tablet daily and Arava 20 mg daily as prescribed.  She continues to tolerate both medications without  any side effects and has not missed any doses recently.  She has not had any recent infections.  She is unsure of the trigger for her current flare.  She has been experiencing increased nocturnal pain and has had increased pain in the right piriformis muscle. Overall she feels this combination of medications has been effective at managing her rheumatoid arthritis.  No medication changes will be made at this time.  She was advised to notify us if her symptoms persist or worsen.  She will follow-up in the office in 5 months.  High risk medication use - Rinvoq 15 mg 1 tablet by mouth once daily and Arava 20 mg 1 tablet by mouth daily. CBC and CMP drawn on 01/11/2022.  Patient will be due to update lab work in April and every 3 months to monitor for drug toxicity.  TB Gold negative on 10/31/2021. She has not had any recent infections.  Discussed the importance of holding Rinvoq and Arava if she develops signs or symptoms of infection and to resume once the infection is completely cleared. Discussed the black box warning associated with taking a Jak inhibitor including increased risk for major cardiovascular events and malignancy. Association of heart disease with rheumatoid arthritis was discussed. Need to monitor blood pressure, cholesterol, and to exercise 30-60 minutes on daily basis was discussed.  Psoriasis: No active psoriasis currently.  Contracture of joint of both elbows - Unchanged.  She has some tenderness palpation over bilateral elbow joints.  No synovitis or olecranon bursitis was noted.  DDD (degenerative disc disease), thoracic: No midline spinal tenderness.  DDD (degenerative disc  disease), lumbar -  She had a MRI of the lumbar spine on 04/02/21: Mild canal stenosis at L3-L4 and L4-L5, slightly progressed.  No midline spinal tenderness.  Trochanteric bursitis, right hip: Resolved  Piriformis syndrome of right side: She presents today with discomfort in the right hip consistent with  piriformis syndrome.  Her symptoms started 2 to 3 weeks ago with no injury or fall prior to the onset of symptoms.  She is not experiencing significant discomfort especially when sitting for prolonged periods of time or while riding in the car.  She requested a right piriformis cortisone injection today.  She tolerated the procedure well.  Procedure note was completed above.  Aftercare was discussed.  She was advised to monitor blood glucose closely following a cortisone injection today.  Osteopenia of multiple sites - Most recent DEXA on 11/11/19: The BMD measured at Femur Neck Left is 0.901 g/cm2 with a T-score of -1.0-Normal.  No recent falls or fractures.  Other insomnia: She experiences interrupted sleep at night due to nocturnal pain.  Discussed the importance of good sleep hygiene.  Other fatigue: She has had some increased fatigue which she attributes to being diagnosed with iron deficiency anemia.  She has started on iron which she has been taking as prescribed.  History of hypertension: Blood pressure was 122/78 today in the office.  History of vitamin D deficiency  History of diabetes mellitus: Patient was advised to monitor blood glucose closely found a cortisone injection today.  History of gastroesophageal reflux (GERD)  History of hyperlipidemia  History of coronary artery disease  History of migraine  History of Graves' disease  Abnormal SPEP - she had an abnormal SPEP in 2020.   Orders: Orders Placed This Encounter  Procedures   Trigger Point Inj   No orders of the defined types were placed in this encounter.   Follow-Up Instructions: Return in about 5 months (around 07/20/2022) for Rheumatoid arthritis, DDD.   Ofilia Neas, PA-C  Note - This record has been created using Dragon software.  Chart creation errors have been sought, but may not always  have been located. Such creation errors do not reflect on  the standard of medical care.

## 2022-02-14 ENCOUNTER — Other Ambulatory Visit: Payer: Self-pay

## 2022-02-14 ENCOUNTER — Other Ambulatory Visit (HOSPITAL_COMMUNITY): Payer: Self-pay

## 2022-02-15 ENCOUNTER — Other Ambulatory Visit: Payer: Self-pay

## 2022-02-16 ENCOUNTER — Other Ambulatory Visit (HOSPITAL_COMMUNITY): Payer: Self-pay

## 2022-02-18 ENCOUNTER — Other Ambulatory Visit: Payer: Self-pay | Admitting: Cardiovascular Disease

## 2022-02-19 ENCOUNTER — Other Ambulatory Visit: Payer: Self-pay

## 2022-02-19 MED ORDER — TORSEMIDE 20 MG PO TABS
20.0000 mg | ORAL_TABLET | Freq: Every day | ORAL | 0 refills | Status: DC
  Filled 2022-02-19: qty 15, 15d supply, fill #0

## 2022-02-20 ENCOUNTER — Encounter: Payer: Self-pay | Admitting: Physician Assistant

## 2022-02-20 ENCOUNTER — Other Ambulatory Visit (HOSPITAL_COMMUNITY): Payer: Self-pay

## 2022-02-20 ENCOUNTER — Ambulatory Visit: Payer: 59 | Admitting: Physician Assistant

## 2022-02-20 ENCOUNTER — Other Ambulatory Visit: Payer: Self-pay

## 2022-02-20 VITALS — BP 122/78 | HR 81 | Ht 63.0 in | Wt 170.4 lb

## 2022-02-20 DIAGNOSIS — M5134 Other intervertebral disc degeneration, thoracic region: Secondary | ICD-10-CM | POA: Diagnosis not present

## 2022-02-20 DIAGNOSIS — M7061 Trochanteric bursitis, right hip: Secondary | ICD-10-CM

## 2022-02-20 DIAGNOSIS — L409 Psoriasis, unspecified: Secondary | ICD-10-CM | POA: Diagnosis not present

## 2022-02-20 DIAGNOSIS — G4709 Other insomnia: Secondary | ICD-10-CM

## 2022-02-20 DIAGNOSIS — R5383 Other fatigue: Secondary | ICD-10-CM

## 2022-02-20 DIAGNOSIS — Z8669 Personal history of other diseases of the nervous system and sense organs: Secondary | ICD-10-CM

## 2022-02-20 DIAGNOSIS — M8589 Other specified disorders of bone density and structure, multiple sites: Secondary | ICD-10-CM | POA: Diagnosis not present

## 2022-02-20 DIAGNOSIS — R778 Other specified abnormalities of plasma proteins: Secondary | ICD-10-CM

## 2022-02-20 DIAGNOSIS — M5136 Other intervertebral disc degeneration, lumbar region: Secondary | ICD-10-CM

## 2022-02-20 DIAGNOSIS — M24521 Contracture, right elbow: Secondary | ICD-10-CM | POA: Diagnosis not present

## 2022-02-20 DIAGNOSIS — M24522 Contracture, left elbow: Secondary | ICD-10-CM

## 2022-02-20 DIAGNOSIS — G5701 Lesion of sciatic nerve, right lower limb: Secondary | ICD-10-CM | POA: Diagnosis not present

## 2022-02-20 DIAGNOSIS — Z8639 Personal history of other endocrine, nutritional and metabolic disease: Secondary | ICD-10-CM

## 2022-02-20 DIAGNOSIS — Z79899 Other long term (current) drug therapy: Secondary | ICD-10-CM

## 2022-02-20 DIAGNOSIS — Z8679 Personal history of other diseases of the circulatory system: Secondary | ICD-10-CM

## 2022-02-20 DIAGNOSIS — M0609 Rheumatoid arthritis without rheumatoid factor, multiple sites: Secondary | ICD-10-CM | POA: Diagnosis not present

## 2022-02-20 DIAGNOSIS — Z8719 Personal history of other diseases of the digestive system: Secondary | ICD-10-CM

## 2022-02-20 MED ORDER — TRIAMCINOLONE ACETONIDE 40 MG/ML IJ SUSP
30.0000 mg | INTRAMUSCULAR | Status: AC | PRN
  Administered 2022-02-20: 30 mg via INTRAMUSCULAR

## 2022-02-20 MED ORDER — LIDOCAINE HCL 1 % IJ SOLN
0.5000 mL | INTRAMUSCULAR | Status: AC | PRN
  Administered 2022-02-20: .5 mL

## 2022-02-22 ENCOUNTER — Other Ambulatory Visit: Payer: Self-pay

## 2022-02-27 ENCOUNTER — Other Ambulatory Visit: Payer: Self-pay

## 2022-02-27 MED ORDER — FREESTYLE LITE TEST VI STRP
ORAL_STRIP | 99 refills | Status: DC
  Filled 2022-02-27: qty 100, 30d supply, fill #0
  Filled 2022-06-22: qty 100, 30d supply, fill #1

## 2022-02-27 MED ORDER — FREESTYLE FREEDOM LITE W/DEVICE KIT
PACK | 0 refills | Status: DC
  Filled 2022-02-27: qty 1, 1d supply, fill #0

## 2022-02-27 MED ORDER — FREESTYLE LANCETS MISC
99 refills | Status: AC
  Filled 2022-02-27: qty 100, 30d supply, fill #0

## 2022-03-01 ENCOUNTER — Encounter: Payer: Self-pay | Admitting: Cardiovascular Disease

## 2022-03-01 ENCOUNTER — Other Ambulatory Visit: Payer: Self-pay

## 2022-03-01 ENCOUNTER — Ambulatory Visit: Payer: 59 | Admitting: Cardiovascular Disease

## 2022-03-01 VITALS — BP 124/62 | HR 76 | Ht 63.0 in | Wt 170.0 lb

## 2022-03-01 DIAGNOSIS — Z951 Presence of aortocoronary bypass graft: Secondary | ICD-10-CM

## 2022-03-01 DIAGNOSIS — I2510 Atherosclerotic heart disease of native coronary artery without angina pectoris: Secondary | ICD-10-CM | POA: Diagnosis not present

## 2022-03-01 DIAGNOSIS — E782 Mixed hyperlipidemia: Secondary | ICD-10-CM

## 2022-03-01 DIAGNOSIS — I10 Essential (primary) hypertension: Secondary | ICD-10-CM | POA: Diagnosis not present

## 2022-03-01 NOTE — Patient Instructions (Signed)
Medication Instructions:  ?Your physician recommends that you continue on your current medications as directed. Please refer to the Current Medication list given to you today. ? ?*If you need a refill on your cardiac medications before your next appointment, please call your pharmacy* ? ? ?Lab Work: ?NONE ?If you have labs (blood work) drawn today and your tests are completely normal, you will receive your results only by: ?MyChart Message (if you have MyChart) OR ?A paper copy in the mail ?If you have any lab test that is abnormal or we need to change your treatment, we will call you to review the results. ? ? ?Testing/Procedures: ?NONE ? ? ?Follow-Up: ?At [facility], you and your health needs are our priority.  As part of our continuing mission to provide you with exceptional heart care, we have created designated Provider Care Teams.  These Care Teams include your primary Cardiologist (physician) and Advanced Practice Providers (APPs -  Physician Assistants and Nurse Practitioners) who all work together to provide you with the care you need, when you need it. ? ?Your next appointment:   ?1 year(s) ? ?The format for your next appointment:   ?In Person ? ?Provider:   ?Sherren Mocha, MD   ? ? ?  ?

## 2022-03-01 NOTE — Progress Notes (Signed)
Cardiology Office Note:    Date:  03/01/2022   ID:  Lerlene, Treadwell 1966-08-02, MRN [id number]  PCP:  McLean-Scocuzza, Nino Glow, MD   [facility] Providers Cardiologist:  Sherren Mocha, MD     Referring MD: McLean-Scocuzza, Olivia Mackie *   Chief Complaint  Patient presents with   Coronary Artery Disease    History of Present Illness:    [name] is a 56 y.o. female with a hx of coronary artery disease, initially presenting with an inferior wall MI treated with PCI of the right coronary artery.  She ultimately was treated with multivessel CABG in 2010 in the setting of a chronically occluded LAD.  Comorbid conditions include diabetes, hypertension, hyperlipidemia, gastroesophageal reflux disease.  She has also had Graves' disease and rheumatoid arthritis.  The patient is here alone today.  She is doing well other than complaint of generalized fatigue.  She did have a lot of problems with edema in the past.  When she switched from furosemide to torsemide, her edema completely resolved.  She is pleased with her response and remains on torsemide 20 mg daily.  She denies orthopnea, PND, heart palpitations, chest pain, chest pressure, or shortness of breath.  The patient is compliant with her medicines.  Her twin daughters went through the early college and both have now graduated college at age 74.  One of them is working for [employer] and the other is considering medical school.    Past Medical History:  Diagnosis Date   ACUT MI ANTEROLAT WALL SUBSQT EPIS CARE    Acute maxillary sinusitis    ALLERGIC RHINITIS, SEASONAL    Anemia    ARTHRITIS, RHEUMATOID    shoulders and hands Enbrel>leg swelling Dr. Estanislado Pandy   Atrial fibrillation [facility])    a. after CABG.   Back pain    Bruxism (teeth grinding)    CAD, ARTERY BYPASS GRAFT    a. DES to RCA in 2010 then LAD occlusion s/p CABG 3 06/07/2009 with LIMA to LAD, reverse SVG to D1, reverse SVG to distal RCA. b. Cath 05/08/2016 slightly  hypodense region in the intermediate branch, however she had excellent flow, FFR was normal. Vein graft to PDA and the posterolateral branch is patent, patent LIMA to LAD, occluded SVG to diagonal.   CARPAL TUNNEL SYNDROME, BILATERAL    CHOLELITHIASIS    Contrast media allergy    DERMATITIS, ALLERGIC    DIABETES MELLITUS, TYPE I    on insulin pump dx'ed age 75 y.o    DIABETIC  RETINOPATHY    Hiatal hernia    HYPERLIPIDEMIA-MIXED    HYPERTHYROIDISM    Dr. Dollene Cleveland   IDA (iron deficiency anemia)    Insulin pump in place    Lymphadenopathy of head and neck    MIGRAINE W/O AURA W/INTRACT W/STATUS MIGRAINOSUS 02/19/2008   PONV (postoperative nausea and vomiting)    Psoriasis    SINUS TACHYCARDIA 11/08/2010   Sleep apnea    not using machine yet    SVT (supraventricular tachycardia) ([place])    after s/p CABG   TRIGGER FINGER    all fingers b/l hands    URI     Past Surgical History:  Procedure Laterality Date   ABDOMINAL HYSTERECTOMY     endometriomas b/l; total ? cervix removed; no h/o abnormal pap   Caesarean section     CARDIAC CATHETERIZATION N/A 05/08/2016   Procedure: Left Heart Cath and Cors/Grafts Angiography;  Surgeon: Burnell Blanks, MD;  Location: [place] [facility];  Service: Cardiovascular;  Laterality: N/A;   CARPAL TUNNEL RELEASE     b/l    CARPAL TUNNEL RELEASE Left 06/06/2021   Procedure: CARPAL TUNNEL RELEASE;  Surgeon: Daryll Brod, MD;  Location: [place];  Service: Orthopedics;  Laterality: Left;  AXILLARY BLOCK   CATARACT EXTRACTION, BILATERAL     CHOLECYSTECTOMY     COLONOSCOPY WITH PROPOFOL N/A 03/25/2020   Procedure: COLONOSCOPY WITH PROPOFOL;  Surgeon: Jonathon Bellows, MD;  Location: [facility] ENDOSCOPY;  Service: Gastroenterology;  Laterality: N/A;   CORONARY ARTERY BYPASS GRAFT     ESOPHAGOGASTRODUODENOSCOPY (EGD) WITH PROPOFOL N/A 03/25/2020   Procedure: ESOPHAGOGASTRODUODENOSCOPY (EGD) WITH PROPOFOL;  Surgeon: Jonathon Bellows, MD;   Location: [facility] ENDOSCOPY;  Service: Gastroenterology;  Laterality: N/A;   EYE SURGERY     laser x 2 for retinopathy    LEFT HEART CATH AND CORS/GRAFTS ANGIOGRAPHY N/A 02/22/2020   Procedure: LEFT HEART CATH AND CORS/GRAFTS ANGIOGRAPHY;  Surgeon: Burnell Blanks, MD;  Location: [facility];  Service: Cardiovascular;  Laterality: N/A;   TRIGGER FINGER RELEASE     b/l fingers all    ULNAR NERVE TRANSPOSITION Left 06/06/2021   Procedure: ULNAR NERVE DECOMPRESSION;  Surgeon: Daryll Brod, MD;  Location: [place];  Service: Orthopedics;  Laterality: Left;  AXILLARY BLOCK   VITRECTOMY     b/l     Current Medications: Current Meds  Medication Sig   aspirin EC 81 MG tablet Take 81 mg by mouth daily.   Blood Glucose Monitoring Suppl (FREESTYLE FREEDOM LITE) w/Device KIT Use as directed by physician to check blood sugar   CHELATED MAGNESIUM PO Take 250 mg by mouth daily.   clopidogrel (PLAVIX) 75 MG tablet TAKE 1 TABLET BY MOUTH ONCE DAILY   Continuous Blood Gluc Receiver (DEXCOM G6 RECEIVER) DEVI Dispense and use as directed   Continuous Blood Gluc Sensor (DEXCOM G6 SENSOR) MISC Dispense and use as directed.  Change sensor every 10 days.   Continuous Blood Gluc Transmit (DEXCOM G6 TRANSMITTER) MISC Dispense and use as directed.  Change transmitter every 90 days.   cyclobenzaprine (FLEXERIL) 10 MG tablet TAKE ONE TABLET BY MOUTH AT BEDTIME.   dicyclomine (BENTYL) 20 MG tablet Take 1 tablet (20 mg total) by mouth 3 (three) times daily as needed for spasms (abdominal pain).   EPINEPHrine 0.3 mg/0.3 mL IJ SOAJ injection Inject 0.3 mg into the muscle as needed for anaphylaxis.   glucose blood (CONTOUR NEXT TEST) test strip USE TO CHECK BLOOD SUGAR 10 TIMES DAILY   glucose blood (FREESTYLE LITE) test strip Use to check blood sugar 3 time(s) daily   insulin lispro (HUMALOG) 100 UNIT/ML injection INJECT UP TO 90 UNITS DAILY IN INSULIN PUMP   Insulin Syringe-Needle U-100 (BD  INSULIN SYRINGE U/F) 31G X 5/16" 0.3 ML MISC use with insulin injections 3 times daily in the event of insulin pump failure.   ipratropium (ATROVENT) 0.06 % nasal spray Place 2 sprays into both nostrils 4 (four) times daily as needed for rhinitis.   Iron, Ferrous Sulfate, 325 (65 Fe) MG TABS Take 325 mg by mouth daily.   isosorbide mononitrate (IMDUR) 60 MG 24 hr tablet TAKE 1 TABLET BY MOUTH DAILY.   Krill Oil 500 MG CAPS Take 500 mg by mouth daily.   Lancets (FREESTYLE) lancets Use to check blood sugar 3 time(s) daily.   leflunomide (ARAVA) 20 MG tablet TAKE 1 TABLET BY MOUTH DAILY   linaclotide (LINZESS)  290 MCG CAPS capsule Take 1 capsule (290 mcg total) by mouth daily before breakfast.   losartan (COZAAR) 100 MG tablet TAKE 1 TABLET BY MOUTH DAILY   metoprolol tartrate (LOPRESSOR) 50 MG tablet Take 0.5 tablets (25 mg total) by mouth 2 (two) times daily.   mupirocin ointment (BACTROBAN) 2 % Apply topically 3 (three) times daily for 7 days (Patient taking differently: Apply 1 application topically 2 (two) times daily as needed.)   ondansetron (ZOFRAN-ODT) 4 MG disintegrating tablet Take 4 mg by mouth every 8 (eight) hours as needed.   ONE TOUCH ULTRA TEST test strip    pantoprazole (PROTONIX) 40 MG tablet Take 1 tablet (40 mg total) by mouth 2 (two) times daily.   potassium chloride (KLOR-CON) 10 MEQ tablet Take 1 tablet (10 mEq total) by mouth daily.   promethazine (PHENERGAN) 25 MG tablet TAKE 1/2- 1 TABLETS (12.5-25 MG TOTAL) BY MOUTH 2 TIMES DAILY AS NEEDED FOR NAUSEA OR VOMITING.   rosuvastatin (CRESTOR) 20 MG tablet Take 1 tablet (20 mg total) by mouth daily.   Semaglutide,0.25 or 0.5MG/DOS, (OZEMPIC, 0.25 OR 0.5 MG/DOSE,) 2 MG/1.5ML SOPN Inject one half mg into the skin once a week.   torsemide (DEMADEX) 20 MG tablet Take 1 tablet (20 mg total) by mouth daily. Pt needs to make appt with provider for further refills - 2nd attempt   Ubrogepant (UBRELVY) 100 MG TABS Take 1 tablet by mouth  as needed   Upadacitinib ER (RINVOQ) 15 MG TB24 Take 1 tablet (15 mg) by mouth daily.     Allergies:   Actemra [tocilizumab], Ramipril, Shellfish-derived products, Atorvastatin, Compazine  [prochlorperazine edisylate], Emgality [galcanezumab-gnlm], Etanercept, Infliximab, Iohexol, Orencia [abatacept], Prochlorperazine edisylate, Tofacitinib, Trokendi xr [topiramate er], Tramadol, Amiodarone, and Rituximab   Social History   Socioeconomic History   Marital status: Divorced    Spouse name: Not on file   Number of children: Not on file   Years of education: Not on file   Highest education level: Not on file  Occupational History   Not on file  Tobacco Use   Smoking status: Never    Passive exposure: Never   Smokeless tobacco: Never  Vaping Use   Vaping Use: Never used  Substance and Sexual Activity   Alcohol use: Yes    Comment: rarely 1 every 6 months   Drug use: No   Sexual activity: Not Currently    Partners: Male    Birth control/protection: None  Other Topics Concern   Not on file  Social History Narrative   Divorced. Has 2 kids(fraternal twins) daughters age 64. Works at [employer], Never smoked, denies ETOH, no drugs. Drinks diet coke. No exercise.       DPR daughters [name] and Cyenna Rebello twins          Social Determinants of Health   Financial Resource Strain: Not on file  Food Insecurity: Not on file  Transportation Needs: Not on file  Physical Activity: Not on file  Stress: Not on file  Social Connections: Not on file     Family History: The patient's family history includes Anxiety disorder in her mother; Colon polyps in her father; Depression in her mother and another family member; Diabetes in her father; Heart disease in an other family member; Hyperlipidemia in an other family member; Hypertension in her father and another family member; Migraines in an other family member; Parkinson's disease in her father; Stroke in her paternal  grandmother. There is no history of  Heart attack, Colon cancer, Stomach cancer, or Esophageal cancer.  ROS:   Please see the history of present illness.    All other systems reviewed and are negative.  EKGs/Labs/Other Studies Reviewed:    The following studies were reviewed today: Echo 01/26/2021:  1. Left ventricular ejection fraction, by estimation, is 60 to 65%. The  left ventricle has normal function. The left ventricle has no regional  wall motion abnormalities. Left ventricular diastolic parameters were  normal.   2. Right ventricular systolic function is normal. The right ventricular  size is normal.   3. The mitral valve is normal in structure. No evidence of mitral valve  regurgitation. No evidence of mitral stenosis.   4. The aortic valve was not well visualized. Aortic valve regurgitation  is not visualized.   5. The inferior vena cava is normal in size with greater than 50%  respiratory variability, suggesting right atrial pressure of 3 mmHg.   Myoview 06/06/2018: Probable normal perfusion and soft tissue attenuation Diaphragm No significant ischemia or scar Nuclear stress EF: 62%. This is a low risk study.   Cardiac Cath 02/22/20: Ramus lesion is 40% stenosed. Prox LAD lesion is 100% stenosed. SVG graft was not visualized due to known occlusion. Origin lesion is 100% stenosed. SVG graft was visualized by angiography and is normal in caliber. Prox RCA to Mid RCA lesion is 99% stenosed. LIMA graft was visualized by angiography and is normal in caliber. The left ventricular systolic function is normal. LV end diastolic pressure is normal. The left ventricular ejection fraction is 55-65% by visual estimate. There is no mitral valve regurgitation.   1. Severe double vessel CAD s/p 3V CABG 2. The LAD is occluded proximally. The mid and distal LAD and diagonal branch fill from the patent LIMA graft. The vein graft to the diagonal branch is known to be occluded and was not  selectively engaged today.   3. The Circumflex is small in caliber and has mild plaque disease. The intermediate branch is a moderate caliber vessel with mild proximal stenosis.   4. The RCA is previously stented in the mid segment. The mid stented segment has diffuse 99% stenosis. The vein graft to the distal branches is patent.  5. Normal LV systolic function   Recommendations: Continue medical management of CAD   EKG:  EKG is ordered today.  The ekg ordered today demonstrates normal sinus rhythm 76 bpm, low voltage QRS, otherwise normal  Recent Labs: 01/11/2022: ALT 16; BUN 10; Creatinine, Ser 0.90; Hemoglobin 11.9; Platelets 222.0; Potassium 3.5; Sodium 140; TSH 1.07  Recent Lipid Panel    Component Value Date/Time   CHOL 144 05/23/2020 0847   TRIG 123.0 05/23/2020 0847   HDL 57.70 05/23/2020 0847   CHOLHDL 2 05/23/2020 0847   VLDL 24.6 05/23/2020 0847   LDLCALC 61 05/23/2020 0847     Risk Assessment/Calculations:           Physical Exam:    VS:  BP 124/62    Pulse 76    Ht $R'5\' 3"'LG$  (1.6 m)    Wt 170 lb (77.1 kg)    SpO2 95%    BMI 30.11 kg/m     Wt Readings from Last 3 Encounters:  03/01/22 170 lb (77.1 kg)  02/20/22 170 lb 6.4 oz (77.3 kg)  02/07/22 168 lb (76.2 kg)     GEN:  Well nourished, well developed in no acute distress HEENT: Normal NECK: No JVD; No carotid bruits LYMPHATICS: No lymphadenopathy CARDIAC:  RRR, no murmurs, rubs, gallops RESPIRATORY:  Clear to auscultation without rales, wheezing or rhonchi  ABDOMEN: Soft, non-tender, non-distended MUSCULOSKELETAL:  No edema; No deformity  SKIN: Warm and dry NEUROLOGIC:  Alert and oriented x 3 PSYCHIATRIC:  Normal affect   ASSESSMENT:    1. Coronary artery disease involving native coronary artery of native heart without angina pectoris   2. Hx of CABG   3. Essential hypertension   4. Mixed hyperlipidemia    PLAN:    In order of problems listed above:  In type I diabetic with premature coronary  artery disease.  She remains on dual antiplatelet therapy with aspirin and clopidogrel.  She is treated with a beta-blocker and long-acting nitrate.  She currently is doing very well with no anginal chest pain.  Most recent cardiac catheterization reviewed as outlined above. As above Blood pressure is well controlled on a combination of losartan, isosorbide, and metoprolol. Last lipids reviewed with an LDL cholesterol of 61 mg/dL.  Recent transaminases are normal with an ALT of 16.  Continue current management.  Treated with a high intensity statin drug.  We will        Medication Adjustments/Labs and Tests Ordered: Current medicines are reviewed at length with the patient today.  Concerns regarding medicines are outlined above.  Orders Placed This Encounter  Procedures   EKG 12-Lead   No orders of the defined types were placed in this encounter.   Patient Instructions  Medication Instructions:  Your physician recommends that you continue on your current medications as directed. Please refer to the Current Medication list given to you today.  *If you need a refill on your cardiac medications before your next appointment, please call your pharmacy*   Lab Work: NONE If you have labs (blood work) drawn today and your tests are completely normal, you will receive your results only by: [place] (if you have MyChart) OR A paper copy in the mail If you have any lab test that is abnormal or we need to change your treatment, we will call you to review the results.   Testing/Procedures: NONE   Follow-Up: At [facility], you and your health needs are our priority.  As part of our continuing mission to provide you with exceptional heart care, we have created designated Provider Care Teams.  These Care Teams include your primary Cardiologist (physician) and Advanced Practice Providers (APPs -  Physician Assistants and Nurse Practitioners) who all work together to provide you with  the care you need, when you need it.  Your next appointment:   1 year(s)  The format for your next appointment:   In Person  Provider:   Sherren Mocha, MD         Signed, Sherren Mocha, MD  03/01/2022 7:42 PM    [place]

## 2022-03-02 ENCOUNTER — Other Ambulatory Visit: Payer: Self-pay

## 2022-03-05 ENCOUNTER — Other Ambulatory Visit: Payer: Self-pay

## 2022-03-07 ENCOUNTER — Other Ambulatory Visit: Payer: Self-pay | Admitting: Cardiovascular Disease

## 2022-03-07 ENCOUNTER — Other Ambulatory Visit: Payer: Self-pay

## 2022-03-07 MED ORDER — INSULIN LISPRO 100 UNIT/ML IJ SOLN
INTRAMUSCULAR | 5 refills | Status: DC
  Filled 2022-06-15: qty 90, 90d supply, fill #0
  Filled 2022-09-20: qty 90, 90d supply, fill #1

## 2022-03-07 MED ORDER — TORSEMIDE 20 MG PO TABS
20.0000 mg | ORAL_TABLET | Freq: Every day | ORAL | 3 refills | Status: DC
  Filled 2022-03-07 (×2): qty 90, 90d supply, fill #0
  Filled 2022-06-03: qty 90, 90d supply, fill #1
  Filled 2022-09-03: qty 90, 90d supply, fill #2
  Filled 2022-12-16: qty 90, 90d supply, fill #3

## 2022-03-08 ENCOUNTER — Other Ambulatory Visit: Payer: Self-pay

## 2022-03-13 ENCOUNTER — Other Ambulatory Visit (HOSPITAL_COMMUNITY): Payer: Self-pay

## 2022-03-13 ENCOUNTER — Other Ambulatory Visit: Payer: Self-pay | Admitting: Rheumatology

## 2022-03-13 DIAGNOSIS — M0609 Rheumatoid arthritis without rheumatoid factor, multiple sites: Secondary | ICD-10-CM

## 2022-03-13 MED ORDER — RINVOQ 15 MG PO TB24
15.0000 mg | ORAL_TABLET | Freq: Every day | ORAL | 2 refills | Status: DC
  Filled ????-??-??: fill #0

## 2022-03-13 NOTE — Telephone Encounter (Signed)
Next Visit: 07/19/2022 ? ?Last Visit: 02/20/2022 ? ?Last Fill: 12/26/2021 ? ?OI:NOMVEHMCNO arthritis of multiple sites with negative rheumatoid factor  ? ?Current Dose per office note 02/20/2022: Rinvoq 15 mg 1 tablet by mouth once daily  ? ?Labs: 01/11/2022 Hgb 11.9, Hct 34.8, Glucose 108,  ? ?TB Gold: 10/31/2021 Neg   ? ?Okay to refill Rinvoq?  ?

## 2022-03-14 ENCOUNTER — Other Ambulatory Visit (HOSPITAL_COMMUNITY): Payer: Self-pay

## 2022-03-15 ENCOUNTER — Other Ambulatory Visit: Payer: Self-pay | Admitting: Pharmacist

## 2022-03-15 ENCOUNTER — Other Ambulatory Visit (HOSPITAL_COMMUNITY): Payer: Self-pay

## 2022-03-15 MED ORDER — RINVOQ 15 MG PO TB24
15.0000 mg | ORAL_TABLET | Freq: Every day | ORAL | 2 refills | Status: DC
  Filled 2022-03-15: qty 30, 30d supply, fill #0
  Filled 2022-04-13: qty 30, 30d supply, fill #1
  Filled 2022-05-10: qty 30, 30d supply, fill #2

## 2022-03-15 NOTE — Telephone Encounter (Signed)
Please offer referral to orthopedics or physical therapy.  It is too soon for a repeat injection.

## 2022-03-19 ENCOUNTER — Ambulatory Visit (INDEPENDENT_AMBULATORY_CARE_PROVIDER_SITE_OTHER): Payer: 59

## 2022-03-19 ENCOUNTER — Encounter: Payer: Self-pay | Admitting: Physician Assistant

## 2022-03-19 ENCOUNTER — Ambulatory Visit: Payer: 59 | Admitting: Physician Assistant

## 2022-03-19 DIAGNOSIS — M25551 Pain in right hip: Secondary | ICD-10-CM

## 2022-03-19 NOTE — Progress Notes (Signed)
? ?Office Visit Note ?  ?Patient: [name]           ?Date of Birth: 1966/04/30           ?MRN: [id number] ?Visit Date: 03/19/2022 ?             ?Requested by: Lisa Neas, PA-C ?[address] ?[address] ?[place],  [place] [address] ?PCP: [name], Lisa Glow, MD ? ? ?Assessment & Plan: ?Visit Diagnoses:  ?1. Pain in right hip   ? ? ?Plan: Discussed with her formal physical therapy for IT band stretching she defers.  Therefore she shown stretching exercises she can do on her own.  We will see her back in 4 weeks to see what type of response she had to this.  May consider repeat trochanteric injection at that time.  Questions were encouraged and answered. ? ?Follow-Up Instructions: Return in about 4 weeks (around 04/16/2022).  ? ?Orders:  ?Orders Placed This Encounter  ?Procedures  ? XR HIP UNILAT W OR W/O PELVIS 2-3 VIEWS RIGHT  ? ?No orders of the defined types were placed in this encounter. ? ? ? ? Procedures: ?No procedures performed ? ? ?Clinical Data: ?No additional findings. ? ? ?Subjective: ?Chief Complaint  ?Patient presents with  ? Right Hip - Pain  ? ? ?HPI ?[name] is a 56 year old female comes in today with right hip pain.  She states has been ongoing for the past 2 months no injury.  She states Dr. Colon [name] tried an injection in his trochanteric injection less than 30 days ago.  She is having 5-6 out of 10 pain over the lateral aspect of her right lateral hip and buttocks region.  No groin pain.  No radicular symptoms.  She states she occasional ibuprofen but does not supposed to take this due to the fact she is on Plavix. ? ? ?Review of Systems ?See HPI otherwise negative ? ?Objective: ?Vital Signs: There were no vitals taken for this visit. ? ?Physical Exam ?General well-developed well-nourished female no acute distress. ?Psych: Alert and oriented x3 ?Ortho Exam ?Bilateral hips good range of motion of both hips.  Tenderness over the trochanteric region right greater than left hip. ?Specialty  Comments:  ?No specialty comments available. ? ?Imaging: ?XR HIP UNILAT W OR W/O PELVIS 2-3 VIEWS RIGHT ? ?Result Date: 03/19/2022 ?AP pelvis and lateral view of the right hip: Bilateral hips well located.  Hip joints well-preserved.  No acute fractures or bony abnormalities.  ? ? ?PMFS History: ?Patient Active Problem List  ? Diagnosis Date Noted  ? Iron deficiency anemia 01/16/2022  ? Right ear pain 01/09/2022  ? Sore throat 01/09/2022  ? OSA on CPAP 08/02/2021  ? Abnormal MRI, lumbar spine 03/20/2021  ? Abnormal MRI, thoracic spine 03/20/2021  ? Chronic mid back pain 03/17/2021  ? Chronic midline low back pain with left-sided sciatica 03/17/2021  ? Type 1 diabetes mellitus with diabetic polyneuropathy ([place]) 02/17/2021  ? Severe obstructive sleep apnea-hypopnea syndrome 02/17/2021  ? Chronic diastolic CHF (congestive heart failure) ([place]) 12/19/2020  ? Chronic back pain 12/19/2020  ? Chronic abdominal pain 12/19/2020  ? Acute diastolic heart failure ([place]) 12/09/2020  ? Diabetic retinopathy associated with type 1 diabetes mellitus ([place]) 09/27/2020  ? Diabetic retinopathy ([place]) 09/27/2020  ? Graves' disease 09/27/2020  ? Chronic coronary artery disease 09/27/2020  ? Hypertension associated with diabetes ([place]) 09/14/2020  ? Obesity (BMI 30-39.9) 09/14/2020  ? Annual physical exam 09/14/2020  ? Aortic atherosclerosis ([place]) 09/14/2020  ?  Snoring 05/12/2020  ? Lung nodule 03/10/2020  ? Colitis 03/10/2020  ? Type 1 diabetes mellitus with proliferative retinopathy ([place]) 12/10/2019  ? Abnormal thyroid function test 12/10/2019  ? Bruxism (teeth grinding)   ? Chronic migraine 10/07/2019  ? Contusion of left hip 07/25/2019  ? H/O multiple allergies 03/10/2019  ? Hx of anaphylaxis 03/10/2019  ? Cough 06/26/2018  ? PND (post-nasal drip) 06/26/2018  ? Lumbar strain, initial encounter 04/10/2018  ? Thoracic myofascial strain 04/10/2018  ? Vitamin D deficiency 07/04/2017  ? B12 deficiency 07/04/2017  ? Abnormal CT scan 06/19/2017  ?  Gastroesophageal reflux disease 06/19/2017  ? Antiplatelet or antithrombotic long-term use 06/19/2017  ? Sinus congestion 03/15/2017  ? Graves disease 02/01/2017  ? Sleep difficulties 02/01/2017  ? No energy 02/01/2017  ? Osteopenia 02/01/2017  ? Bilateral hand pain 07/16/2016  ? Acquired trigger finger 07/16/2016  ? Hypertension, essential 05/08/2016  ? Foot pain, right 08/30/2014  ? Shoulder pain, right 08/30/2014  ? Chronic pain syndrome 01/15/2014  ? Multiple joint pain 01/15/2014  ? Lesion of lower eyelid 04/21/2013  ? Generalized constriction of visual field 03/31/2013  ? Neoplasm of uncertain behavior of skin of eyelid 03/31/2013  ? Posterior capsular opacification 03/31/2013  ? Cyst of left lower eyelid 03/30/2013  ? Pseudophakia of both eyes 03/30/2013  ? Prurigo nodularis 02/05/2013  ? Stasis dermatitis 02/05/2013  ? Psoriasis 01/21/2013  ? Trochanteric bursitis 11/10/2012  ? Achilles tendinitis 07/11/2012  ? Epiretinal membrane 06/17/2012  ? Status post cataract extraction 04/30/2012  ? Pes equinus, acquired 04/23/2012  ? Tendinitis of ankle 04/23/2012  ? Proliferative diabetic retinopathy of both eyes ([place]) 01/11/2012  ? Ongoing use of possibly toxic medication 12/05/2011  ? Hyperthyroidism 11/09/2010  ? SINUS TACHYCARDIA 11/08/2010  ? DERMATITIS, ALLERGIC 07/20/2010  ? EDEMA 07/12/2010  ? Dizziness 11/14/2009  ? ATRIAL FIBRILLATION 07/20/2009  ? Hx of CABG 06/07/2009  ? ANGINA, STABLE/EXERTIONAL 06/02/2009  ? Dyslipidemia, goal LDL below 70 04/26/2009  ? ACUT MI ANTEROLAT WALL SUBSQT EPIS CARE 04/26/2009  ? Chest pain 04/26/2009  ? DIABETIC  RETINOPATHY 04/25/2009  ? CARPAL TUNNEL SYNDROME, BILATERAL 04/25/2009  ? TRIGGER FINGER 04/25/2009  ? MIGRAINE W/O AURA W/INTRACT W/STATUS MIGRAINOSUS 02/19/2008  ? ALLERGIC RHINITIS, SEASONAL 10/16/2006  ? CHOLELITHIASIS 10/16/2006  ? Rheumatoid arthritis ([place]) 10/16/2006  ? ?Past Medical History:  ?Diagnosis Date  ? ACUT MI ANTEROLAT WALL SUBSQT EPIS CARE   ?  Acute maxillary sinusitis   ? ALLERGIC RHINITIS, SEASONAL   ? Anemia   ? ARTHRITIS, RHEUMATOID   ? shoulders and hands Enbrel>leg swelling Dr. Estanislado Pandy  ? Atrial fibrillation ([employer])   ? a. after CABG.  ? Back pain   ? Bruxism (teeth grinding)   ? CAD, ARTERY BYPASS GRAFT   ? a. DES to RCA in 2010 then LAD occlusion s/p CABG ?3 06/07/2009 with LIMA to LAD, reverse SVG to D1, reverse SVG to distal RCA. b. Cath 05/08/2016 slightly hypodense region in the intermediate branch, however she had excellent flow, FFR was normal. Vein graft to PDA and the posterolateral branch is patent, patent LIMA to LAD, occluded SVG to diagonal.  ? CARPAL TUNNEL SYNDROME, BILATERAL   ? CHOLELITHIASIS   ? Contrast media allergy   ? DERMATITIS, ALLERGIC   ? DIABETES MELLITUS, TYPE I   ? on insulin pump dx'ed age 56 y.o   ? DIABETIC  RETINOPATHY   ? Hiatal hernia   ? HYPERLIPIDEMIA-MIXED   ? HYPERTHYROIDISM   ? Dr.  Dollene Cleveland  ? IDA (iron deficiency anemia)   ? Insulin pump in place   ? Lymphadenopathy of head and neck   ? MIGRAINE W/O AURA W/INTRACT W/STATUS MIGRAINOSUS 02/19/2008  ? PONV (postoperative nausea and vomiting)   ? Psoriasis   ? SINUS TACHYCARDIA 11/08/2010  ? Sleep apnea   ? not using machine yet   ? SVT (supraventricular tachycardia) ([place])   ? after s/p CABG  ? TRIGGER FINGER   ? all fingers b/l hands   ? URI   ?  ?Family History  ?Problem Relation Age of Onset  ? Depression Mother   ? Anxiety disorder Mother   ? Lisa polyps Father   ? Diabetes Father   ?     2  ? Hypertension Father   ? Parkinson's disease Father   ? Stroke Paternal Grandmother   ? Heart disease Other   ? Hypertension Other   ? Hyperlipidemia Other   ? Depression Other   ? Migraines Other   ? Heart attack Neg Hx   ? Lisa cancer Neg Hx   ? Stomach cancer Neg Hx   ? Esophageal cancer Neg Hx   ?  ?Past Surgical History:  ?Procedure Laterality Date  ? ABDOMINAL HYSTERECTOMY    ? endometriomas b/l; total ? cervix removed; no h/o abnormal pap  ? Caesarean section     ? CARDIAC CATHETERIZATION N/A 05/08/2016  ? Procedure: Left Heart Cath and Cors/Grafts Angiography;  Surgeon: Burnell Blanks, MD;  Location: [facility];  Service: Cardiovascular;  Ladean Raya

## 2022-03-21 ENCOUNTER — Other Ambulatory Visit (HOSPITAL_COMMUNITY): Payer: Self-pay

## 2022-03-23 ENCOUNTER — Other Ambulatory Visit: Payer: Self-pay

## 2022-03-23 ENCOUNTER — Other Ambulatory Visit: Payer: Self-pay | Admitting: Cardiovascular Disease

## 2022-03-23 MED ORDER — ISOSORBIDE MONONITRATE ER 60 MG PO TB24
ORAL_TABLET | Freq: Every day | ORAL | 3 refills | Status: DC
  Filled 2022-03-23: qty 90, 90d supply, fill #0
  Filled 2022-06-22: qty 90, 90d supply, fill #1
  Filled 2022-06-25: qty 33, 33d supply, fill #1
  Filled 2022-06-25: qty 57, 57d supply, fill #1
  Filled 2022-09-20: qty 90, 90d supply, fill #2
  Filled 2022-12-16: qty 90, 90d supply, fill #3

## 2022-03-28 DIAGNOSIS — G4733 Obstructive sleep apnea (adult) (pediatric): Secondary | ICD-10-CM | POA: Diagnosis not present

## 2022-04-05 ENCOUNTER — Other Ambulatory Visit: Payer: Self-pay

## 2022-04-08 ENCOUNTER — Other Ambulatory Visit: Payer: Self-pay | Admitting: Cardiovascular Disease

## 2022-04-09 ENCOUNTER — Ambulatory Visit: Payer: 59 | Admitting: Family Medicine

## 2022-04-09 ENCOUNTER — Other Ambulatory Visit: Payer: Self-pay | Admitting: Cardiovascular Disease

## 2022-04-09 ENCOUNTER — Telehealth: Payer: 59 | Admitting: Physician Assistant

## 2022-04-09 ENCOUNTER — Other Ambulatory Visit: Payer: Self-pay

## 2022-04-09 DIAGNOSIS — J028 Acute pharyngitis due to other specified organisms: Secondary | ICD-10-CM

## 2022-04-09 DIAGNOSIS — B9689 Other specified bacterial agents as the cause of diseases classified elsewhere: Secondary | ICD-10-CM

## 2022-04-09 MED ORDER — AMOXICILLIN 500 MG PO CAPS
500.0000 mg | ORAL_CAPSULE | Freq: Two times a day (BID) | ORAL | 0 refills | Status: AC
  Filled 2022-04-09: qty 20, 10d supply, fill #0

## 2022-04-09 MED FILL — Losartan Potassium Tab 100 MG: ORAL | 90 days supply | Qty: 90 | Fill #0 | Status: AC

## 2022-04-09 NOTE — Progress Notes (Signed)
E-Visit for Sore Throat - Strep Symptoms ? ?We are sorry that you are not feeling well.  Here is how we plan to help! ? ?Based on what you have shared with me it is likely that you have strep pharyngitis.  Strep pharyngitis is inflammation and infection in the back of the throat.  This is an infection cause by bacteria and is treated with antibiotics.  I have prescribed Amoxicillin 500 mg twice a day for 10 days. For throat pain, we recommend over the counter oral pain relief medications such as acetaminophen or aspirin, or anti-inflammatory medications such as ibuprofen or naproxen sodium. Topical treatments such as oral throat lozenges or sprays may be used as needed. Strep infections are not as easily transmitted as other respiratory infections, however we still recommend that you avoid close contact with loved ones, especially the very young and elderly.  Remember to wash your hands thoroughly throughout the day as this is the number one way to prevent the spread of infection and wipe down door knobs and counters with disinfectant. ? ? ?Home Care: ?Only take medications as instructed by your medical team. ?Complete the entire course of an antibiotic. ?Do not take these medications with alcohol. ?A steam or ultrasonic humidifier can help congestion.  You can place a towel over your head and breathe in the steam from hot water coming from a faucet. ?Avoid close contacts especially the very young and the elderly. ?Cover your mouth when you cough or sneeze. ?Always remember to wash your hands. ? ?Get Help Right Away If: ?You develop worsening fever or sinus pain. ?You develop a severe head ache or visual changes. ?Your symptoms persist after you have completed your treatment plan. ? ?Make sure you ?Understand these instructions. ?Will watch your condition. ?Will get help right away if you are not doing well or get worse. ? ? ?Thank you for choosing an e-visit. ? ?Your e-visit answers were reviewed by a board  certified advanced clinical practitioner to complete your personal care plan. Depending upon the condition, your plan could have included both over the counter or prescription medications. ? ?Please review your pharmacy choice. Make sure the pharmacy is open so you can pick up prescription now. If there is a problem, you may contact your provider through MyChart messaging and have the prescription routed to another pharmacy.  Your safety is important to us. If you have drug allergies check your prescription carefully.  ? ?For the next 24 hours you can use MyChart to ask questions about today's visit, request a non-urgent call back, or ask for a work or school excuse. ?You will get an email in the next two days asking about your experience. I hope that your e-visit has been valuable and will speed your recovery. ? ? ?I provided 5 minutes of non face-to-face time during this encounter for chart review and documentation.  ? ?

## 2022-04-12 ENCOUNTER — Ambulatory Visit (INDEPENDENT_AMBULATORY_CARE_PROVIDER_SITE_OTHER): Payer: 59

## 2022-04-12 ENCOUNTER — Ambulatory Visit
Admission: RE | Admit: 2022-04-12 | Discharge: 2022-04-12 | Disposition: A | Discharge status: Home / Self Care | Payer: 59 | Source: Ambulatory Visit | Attending: Internal Medicine | Admitting: Internal Medicine

## 2022-04-12 ENCOUNTER — Other Ambulatory Visit: Payer: Self-pay

## 2022-04-12 VITALS — BP 136/77 | HR 65 | Temp 98.9°F | Resp 16

## 2022-04-12 DIAGNOSIS — J00 Acute nasopharyngitis [common cold]: Secondary | ICD-10-CM | POA: Insufficient documentation

## 2022-04-12 DIAGNOSIS — R059 Cough, unspecified: Secondary | ICD-10-CM | POA: Diagnosis not present

## 2022-04-12 DIAGNOSIS — J45909 Unspecified asthma, uncomplicated: Secondary | ICD-10-CM | POA: Insufficient documentation

## 2022-04-12 DIAGNOSIS — R0989 Other specified symptoms and signs involving the circulatory and respiratory systems: Secondary | ICD-10-CM | POA: Diagnosis not present

## 2022-04-12 DIAGNOSIS — Z20822 Contact with and (suspected) exposure to covid-19: Secondary | ICD-10-CM | POA: Insufficient documentation

## 2022-04-12 DIAGNOSIS — J029 Acute pharyngitis, unspecified: Secondary | ICD-10-CM | POA: Diagnosis not present

## 2022-04-12 LAB — RESP PANEL BY RT-PCR (FLU A&B, COVID) ARPGX2
Influenza A by PCR: NEGATIVE
Influenza B by PCR: NEGATIVE
SARS Coronavirus 2 by RT PCR: NEGATIVE

## 2022-04-12 MED ORDER — PREDNISONE 20 MG PO TABS
20.0000 mg | ORAL_TABLET | Freq: Every day | ORAL | 0 refills | Status: DC
  Filled 2022-04-12: qty 5, 5d supply, fill #0

## 2022-04-12 MED ORDER — GUAIFENESIN-CODEINE 100-10 MG/5ML PO SOLN: 5.0000 mL | Freq: Three times a day (TID) | ORAL | 0 refills | Status: DC | PRN

## 2022-04-12 MED ORDER — ALBUTEROL SULFATE HFA 108 (90 BASE) MCG/ACT IN AERS
2.0000 | INHALATION_SPRAY | RESPIRATORY_TRACT | 0 refills | Status: DC | PRN
  Filled 2022-04-12: qty 18, 25d supply, fill #0

## 2022-04-12 MED ORDER — GUAIFENESIN-CODEINE 100-10 MG/5ML PO SOLN
5.0000 mL | Freq: Every evening | ORAL | 0 refills | Status: DC | PRN
  Filled 2022-04-12: qty 120, 24d supply, fill #0

## 2022-04-12 NOTE — ED Triage Notes (Signed)
Pt present cough with congestion and sore throat. Symptom started a week ago. Pt has a virtue visit on Monday and was prescribed Antibiotics but symptoms seem to be getting worst.  ?

## 2022-04-12 NOTE — ED Provider Notes (Signed)
[facility]    CSN: [id number] Arrival date & time: 04/12/22  1914      History   Chief Complaint Chief Complaint  Patient presents with   Cough    Cough,  sore throat, headache, congestion - Entered by patient    HPI [name] is a 56 y.o. female who presents due to her cough getting worse. She has had nose congestion, rhinitis, cough and ST x one week and had a virtual visit 4 days ago and was placed on amoxicillin to treat possible strep. She started having a low grade temp, so she thought she best come in. Did a home Covid test and was negative this week. Has HA and has had decreased appetite. Has not been exposed to any sick people or patients.  Her cough is non productive. She has had similar symptoms in the past related to allergic asthma   Past Medical History:  Diagnosis Date   ACUT MI ANTEROLAT WALL SUBSQT EPIS CARE    Acute maxillary sinusitis    ALLERGIC RHINITIS, SEASONAL    Anemia    ARTHRITIS, RHEUMATOID    shoulders and hands Enbrel>leg swelling Dr. Corliss Skains   Atrial fibrillation [facility])    a. after CABG.   Back pain    Bruxism (teeth grinding)    CAD, ARTERY BYPASS GRAFT    a. DES to RCA in 2010 then LAD occlusion s/p CABG 3 06/07/2009 with LIMA to LAD, reverse SVG to D1, reverse SVG to distal RCA. b. Cath 05/08/2016 slightly hypodense region in the intermediate branch, however she had excellent flow, FFR was normal. Vein graft to PDA and the posterolateral branch is patent, patent LIMA to LAD, occluded SVG to diagonal.   CARPAL TUNNEL SYNDROME, BILATERAL    CHOLELITHIASIS    Contrast media allergy    DERMATITIS, ALLERGIC    DIABETES MELLITUS, TYPE I    on insulin pump dx'ed age 39 y.o    DIABETIC  RETINOPATHY    Hiatal hernia    HYPERLIPIDEMIA-MIXED    HYPERTHYROIDISM    Dr. Osborne Casco   IDA (iron deficiency anemia)    Insulin pump in place    Lymphadenopathy of head and neck    MIGRAINE W/O AURA W/INTRACT W/STATUS MIGRAINOSUS  02/19/2008   PONV (postoperative nausea and vomiting)    Psoriasis    SINUS TACHYCARDIA 11/08/2010   Sleep apnea    not using machine yet    SVT (supraventricular tachycardia) (HCC)    after s/p CABG   TRIGGER FINGER    all fingers b/l hands    URI     Patient Active Problem List   Diagnosis Date Noted   Iron deficiency anemia 01/16/2022   Right ear pain 01/09/2022   Sore throat 01/09/2022   OSA on CPAP 08/02/2021   Abnormal MRI, lumbar spine 03/20/2021   Abnormal MRI, thoracic spine 03/20/2021   Chronic mid back pain 03/17/2021   Chronic midline low back pain with left-sided sciatica 03/17/2021   Type 1 diabetes mellitus with diabetic polyneuropathy (HCC) 02/17/2021   Severe obstructive sleep apnea-hypopnea syndrome 02/17/2021   Chronic diastolic CHF (congestive heart failure) (HCC) 12/19/2020   Chronic back pain 12/19/2020   Chronic abdominal pain 12/19/2020   Acute diastolic heart failure (HCC) 12/09/2020   Diabetic retinopathy associated with type 1 diabetes mellitus (HCC) 09/27/2020   Diabetic retinopathy (HCC) 09/27/2020   Graves' disease 09/27/2020   Chronic coronary artery disease 09/27/2020   Hypertension associated with diabetes (  HCC) 09/14/2020   Obesity (BMI 30-39.9) 09/14/2020   Annual physical exam 09/14/2020   Aortic atherosclerosis (HCC) 09/14/2020   Snoring 05/12/2020   Lung nodule 03/10/2020   Colitis 03/10/2020   Type 1 diabetes mellitus with proliferative retinopathy (HCC) 12/10/2019   Abnormal thyroid function test 12/10/2019   Bruxism (teeth grinding)    Chronic migraine 10/07/2019   Contusion of left hip 07/25/2019   H/O multiple allergies 03/10/2019   Hx of anaphylaxis 03/10/2019   Cough 06/26/2018   PND (post-nasal drip) 06/26/2018   Lumbar strain, initial encounter 04/10/2018   Thoracic myofascial strain 04/10/2018   Vitamin D deficiency 07/04/2017   B12 deficiency 07/04/2017   Abnormal CT scan 06/19/2017   Gastroesophageal reflux  disease 06/19/2017   Antiplatelet or antithrombotic long-term use 06/19/2017   Sinus congestion 03/15/2017   Graves disease 02/01/2017   Sleep difficulties 02/01/2017   No energy 02/01/2017   Osteopenia 02/01/2017   Bilateral hand pain 07/16/2016   Acquired trigger finger 07/16/2016   Hypertension, essential 05/08/2016   Foot pain, right 08/30/2014   Shoulder pain, right 08/30/2014   Chronic pain syndrome 01/15/2014   Multiple joint pain 01/15/2014   Lesion of lower eyelid 04/21/2013   Generalized constriction of visual field 03/31/2013   Neoplasm of uncertain behavior of skin of eyelid 03/31/2013   Posterior capsular opacification 03/31/2013   Cyst of left lower eyelid 03/30/2013   Pseudophakia of both eyes 03/30/2013   Prurigo nodularis 02/05/2013   Stasis dermatitis 02/05/2013   Psoriasis 01/21/2013   Trochanteric bursitis 11/10/2012   Achilles tendinitis 07/11/2012   Epiretinal membrane 06/17/2012   Status post cataract extraction 04/30/2012   Pes equinus, acquired 04/23/2012   Tendinitis of ankle 04/23/2012   Proliferative diabetic retinopathy of both eyes (HCC) 01/11/2012   Ongoing use of possibly toxic medication 12/05/2011   Hyperthyroidism 11/09/2010   SINUS TACHYCARDIA 11/08/2010   DERMATITIS, ALLERGIC 07/20/2010   EDEMA 07/12/2010   Dizziness 11/14/2009   ATRIAL FIBRILLATION 07/20/2009   Hx of CABG 06/07/2009   ANGINA, STABLE/EXERTIONAL 06/02/2009   Dyslipidemia, goal LDL below 70 04/26/2009   ACUT MI ANTEROLAT WALL SUBSQT EPIS CARE 04/26/2009   Chest pain 04/26/2009   DIABETIC  RETINOPATHY 04/25/2009   CARPAL TUNNEL SYNDROME, BILATERAL 04/25/2009   TRIGGER FINGER 04/25/2009   MIGRAINE W/O AURA W/INTRACT W/STATUS MIGRAINOSUS 02/19/2008   ALLERGIC RHINITIS, SEASONAL 10/16/2006   CHOLELITHIASIS 10/16/2006   Rheumatoid arthritis (HCC) 10/16/2006    Past Surgical History:  Procedure Laterality Date   ABDOMINAL HYSTERECTOMY     endometriomas b/l; total ?  cervix removed; no h/o abnormal pap   Caesarean section     CARDIAC CATHETERIZATION N/A 05/08/2016   Procedure: Left Heart Cath and Cors/Grafts Angiography;  Surgeon: Kathleene Hazel, MD;  Location: [facility];  Service: Cardiovascular;  Laterality: N/A;   CARPAL TUNNEL RELEASE     b/l    CARPAL TUNNEL RELEASE Left 06/06/2021   Procedure: CARPAL TUNNEL RELEASE;  Surgeon: Cindee Salt, MD;  Location: [facility];  Service: Orthopedics;  Laterality: Left;  AXILLARY BLOCK   CATARACT EXTRACTION, BILATERAL     CHOLECYSTECTOMY     COLONOSCOPY WITH PROPOFOL N/A 03/25/2020   Procedure: COLONOSCOPY WITH PROPOFOL;  Surgeon: Wyline Mood, MD;  Location: [facility];  Service: Gastroenterology;  Laterality: N/A;   CORONARY ARTERY BYPASS GRAFT     ESOPHAGOGASTRODUODENOSCOPY (EGD) WITH PROPOFOL N/A 03/25/2020   Procedure: ESOPHAGOGASTRODUODENOSCOPY (EGD) WITH PROPOFOL;  Surgeon: Wyline Mood, MD;  Location: [facility];  Service: Gastroenterology;  Laterality: N/A;   EYE SURGERY     laser x 2 for retinopathy    LEFT HEART CATH AND CORS/GRAFTS ANGIOGRAPHY N/A 02/22/2020   Procedure: LEFT HEART CATH AND CORS/GRAFTS ANGIOGRAPHY;  Surgeon: Kathleene Hazel, MD;  Location: [facility];  Service: Cardiovascular;  Laterality: N/A;   TRIGGER FINGER RELEASE     b/l fingers all    ULNAR NERVE TRANSPOSITION Left 06/06/2021   Procedure: ULNAR NERVE DECOMPRESSION;  Surgeon: Cindee Salt, MD;  Location: [facility];  Service: Orthopedics;  Laterality: Left;  AXILLARY BLOCK   VITRECTOMY     b/l     OB History   No obstetric history on file.      Home Medications    Prior to Admission medications   Medication Sig Start Date End Date Taking? Authorizing Provider  amoxicillin (AMOXIL) 500 MG capsule Take 1 capsule (500 mg total) by mouth 2 (two) times daily for 10 days. 04/09/22 04/19/22  Margaretann Loveless, PA-C  aspirin EC 81 MG tablet Take 81 mg by mouth  daily.    [provider]  Blood Glucose Monitoring Suppl (FREESTYLE FREEDOM LITE) w/Device KIT Use as directed by physician to check blood sugar 02/27/22     CHELATED MAGNESIUM PO Take 250 mg by mouth daily.    [provider]  Continuous Blood Gluc Receiver (DEXCOM G6 RECEIVER) DEVI Dispense and use as directed 01/30/22     Continuous Blood Gluc Sensor (DEXCOM G6 SENSOR) MISC Dispense and use as directed.  Change sensor every 10 days. 01/30/22     Continuous Blood Gluc Transmit (DEXCOM G6 TRANSMITTER) MISC Dispense and use as directed.  Change transmitter every 90 days. 01/30/22     cyclobenzaprine (FLEXERIL) 10 MG tablet TAKE ONE TABLET BY MOUTH AT BEDTIME. 01/25/22   McLean-Scocuzza, Pasty Spillers, MD  dicyclomine (BENTYL) 20 MG tablet Take 1 tablet (20 mg total) by mouth 3 (three) times daily as needed for spasms (abdominal pain). 09/13/20 03/01/22  Wyline Mood, MD  EPINEPHrine 0.3 mg/0.3 mL IJ SOAJ injection Inject 0.3 mg into the muscle as needed for anaphylaxis. 11/14/21   McLean-Scocuzza, Pasty Spillers, MD  glucose blood (FREESTYLE LITE) test strip Use to check blood sugar 3 time(s) daily 02/27/22     insulin lispro (HUMALOG) 100 UNIT/ML injection INJECT UP TO 90 UNITS DAILY IN INSULIN PUMP 03/07/22     Insulin Syringe-Needle U-100 (BD INSULIN SYRINGE U/F) 31G X 5/16" 0.3 ML MISC use with insulin injections 3 times daily in the event of insulin pump failure. 11/22/21     ipratropium (ATROVENT) 0.06 % nasal spray Place 2 sprays into both nostrils 4 (four) times daily as needed for rhinitis. 11/14/21   McLean-Scocuzza, Pasty Spillers, MD  Iron, Ferrous Sulfate, 325 (65 Fe) MG TABS Take 325 mg by mouth daily. 01/16/22   Flinchum, Eula Fried, FNP  isosorbide mononitrate (IMDUR) 60 MG 24 hr tablet TAKE 1 TABLET BY MOUTH DAILY. 03/23/22   Tonny Bollman, MD  Boris Lown Oil 500 MG CAPS Take 500 mg by mouth daily.    [provider]  Lancets (FREESTYLE) lancets Use to check blood sugar 3 time(s) daily.  02/27/22     leflunomide (ARAVA) 20 MG tablet TAKE 1 TABLET BY MOUTH DAILY 01/29/22 01/29/23  Gearldine Bienenstock, PA-C  linaclotide [facility]) 290 MCG CAPS capsule Take 1 capsule (290 mcg total) by mouth daily before breakfast. 08/07/21   Wyline Mood, MD  losartan (COZAAR) 100 MG tablet TAKE 1 TABLET BY MOUTH DAILY 04/09/22 04/09/23  Tonny Bollman, MD  metoprolol tartrate (LOPRESSOR) 50 MG tablet Take 0.5 tablets (25 mg total) by mouth 2 (two) times daily. 05/01/21   Tonny Bollman, MD  mupirocin ointment (BACTROBAN) 2 % Apply topically 3 (three) times daily for 7 days Patient taking differently: Apply 1 application topically 2 (two) times daily as needed. 10/19/21     ondansetron (ZOFRAN-ODT) 4 MG disintegrating tablet Take 4 mg by mouth every 8 (eight) hours as needed. 08/28/20   [provider]  ONE TOUCH ULTRA TEST test strip  11/20/16   [provider]  pantoprazole (PROTONIX) 40 MG tablet Take 1 tablet (40 mg total) by mouth 2 (two) times daily. 12/29/21 12/29/22  Wyline Mood, MD  potassium chloride (KLOR-CON) 10 MEQ tablet Take 1 tablet (10 mEq total) by mouth daily. 05/08/21 05/14/22  Tonny Bollman, MD  promethazine (PHENERGAN) 25 MG tablet TAKE 1/2- 1 TABLETS (12.5-25 MG TOTAL) BY MOUTH 2 TIMES DAILY AS NEEDED FOR NAUSEA OR VOMITING. 03/22/21 03/22/22  McLean-Scocuzza, Pasty Spillers, MD  rosuvastatin (CRESTOR) 20 MG tablet Take 1 tablet (20 mg total) by mouth daily. 04/27/21   Tonny Bollman, MD  Semaglutide,0.25 or 0.5MG /DOS, (OZEMPIC, 0.25 OR 0.5 MG/DOSE,) 2 MG/1.5ML SOPN Inject one half mg into the skin once a week. 02/05/22     torsemide (DEMADEX) 20 MG tablet Take 1 tablet (20 mg total) by mouth daily. 03/07/22   Tonny Bollman, MD  Ubrogepant (UBRELVY) 100 MG TABS Take 1 tablet by mouth as needed 08/02/21   Dohmeier, Porfirio Mylar, MD  Upadacitinib ER (RINVOQ) 15 MG TB24 Take 1 tablet (15 mg) by mouth daily. 03/15/22 03/15/23  Quentin Angst, MD    Family History Family History  Problem  Relation Age of Onset   Depression Mother    Anxiety disorder Mother    Colon polyps Father    Diabetes Father        2   Hypertension Father    Parkinson's disease Father    Stroke Paternal Grandmother    Heart disease Other    Hypertension Other    Hyperlipidemia Other    Depression Other    Migraines Other    Heart attack Neg Hx    Colon cancer Neg Hx    Stomach cancer Neg Hx    Esophageal cancer Neg Hx     Social History Social History   Tobacco Use   Smoking status: Never    Passive exposure: Never   Smokeless tobacco: Never  Vaping Use   Vaping Use: Never used  Substance Use Topics   Alcohol use: Yes    Comment: rarely 1 every 6 months   Drug use: No     Allergies   Actemra [tocilizumab], Ramipril, Shellfish-derived products, Atorvastatin, Compazine  [prochlorperazine edisylate], Emgality [galcanezumab-gnlm], Etanercept, Infliximab, Iohexol, Orencia [abatacept], Prochlorperazine edisylate, Tofacitinib, Trokendi xr [topiramate er], Tramadol, Amiodarone, and Rituximab   Review of Systems Review of Systems  Constitutional:  Positive for appetite change, chills, fatigue and fever.  HENT:  Positive for congestion, postnasal drip, rhinorrhea and sore throat. Negative for ear discharge, ear pain and trouble swallowing.   Eyes:  Negative for discharge.  Respiratory:  Positive for cough. Negative for wheezing.   Hematological:  Negative for adenopathy.    Physical Exam Triage Vital Signs ED Triage Vitals  Enc Vitals Group     BP 04/12/22 1000 136/77     Pulse Rate 04/12/22 1000  65     Resp 04/12/22 1000 16     Temp 04/12/22 1000 98.9 F (37.2 C)     Temp Source 04/12/22 1000 Oral     SpO2 04/12/22 1000 97 %     Weight --      Height --      Head Circumference --      Peak Flow --      Pain Score 04/12/22 1001 6     Pain Loc --      Pain Edu? --      Excl. in GC? --    No data found.  Updated Vital Signs BP 136/77 (BP Location: Right Arm)   Pulse  65   Temp 98.9 F (37.2 C) (Oral)   Resp 16   SpO2 97%   Visual Acuity Right Eye Distance:   Left Eye Distance:   Bilateral Distance:    Right Eye Near:   Left Eye Near:    Bilateral Near:      Physical Exam Vitals signs and nursing note reviewed.  Constitutional:      General: She is not in acute distress.    Appearance: Normal appearance. She is not ill-appearing, toxic-appearing or diaphoretic.  HENT:     Head: Normocephalic.     Right Ear: Tympanic membrane, ear canal and external ear normal.     Left Ear: Tympanic membrane, ear canal and external ear normal.     Nose: Nose normal.     Mouth/Throat:     Mouth: Mucous membranes are moist.  Eyes:     General: No scleral icterus.       Right eye: No discharge.        Left eye: No discharge.     Conjunctiva/sclera: Conjunctivae normal.  Neck:     Musculoskeletal: Neck supple. No neck rigidity.  Cardiovascular:     Rate and Rhythm: Normal rate and regular rhythm.     Heart sounds: No murmur.  Pulmonary:     Effort: Pulmonary effort is normal.     Breath sounds: Normal breath sounds.   Musculoskeletal: Normal range of motion.  Lymphadenopathy:     Cervical: No cervical adenopathy.  Skin:    General: Skin is warm and dry.     Coloration: Skin is not jaundiced.     Findings: No rash.  Neurological:     Mental Status: She is alert and oriented to person, place, and time.     Gait: Gait normal.  Psychiatric:        Mood and Affect: Mood normal.        Behavior: Behavior normal.        Thought Content: Thought content normal.        Judgment: Judgment normal.    UC Treatments / Results  Labs (all labs ordered are listed, but only abnormal results are displayed) Labs Reviewed  RESP PANEL BY RT-PCR (FLU A&B, COVID) [id number]  Covid and FluA&B negative.   EKG   Radiology No results found.  Procedures Procedures (including critical care time)  Medications Ordered in UC Medications - No data to  display  Initial Impression / Assessment and Plan / UC Course  I have reviewed the triage vital signs and the nursing notes.  Pertinent labs & imaging results that were available during my care of the patient were reviewed by me and considered in my medical decision making (see chart for details).  I placed her on prednisone, Proventil inhaler and Rob  AC as noted   Final Clinical Impressions(s) / UC Diagnoses   Final diagnoses:  None   Discharge Instructions   None    ED Prescriptions   None    PDMP not reviewed this encounter.   Garey Ham, PA-C 04/12/22 1221

## 2022-04-13 ENCOUNTER — Other Ambulatory Visit (HOSPITAL_COMMUNITY): Payer: Self-pay

## 2022-04-16 ENCOUNTER — Ambulatory Visit: Payer: 59 | Admitting: Physician Assistant

## 2022-04-16 DIAGNOSIS — E10319 Type 1 diabetes mellitus with unspecified diabetic retinopathy without macular edema: Secondary | ICD-10-CM | POA: Diagnosis not present

## 2022-04-16 DIAGNOSIS — Z794 Long term (current) use of insulin: Secondary | ICD-10-CM | POA: Diagnosis not present

## 2022-04-18 ENCOUNTER — Other Ambulatory Visit (HOSPITAL_COMMUNITY): Payer: Self-pay

## 2022-04-22 ENCOUNTER — Encounter: Payer: Self-pay | Admitting: Cardiovascular Disease

## 2022-04-22 DIAGNOSIS — R002 Palpitations: Secondary | ICD-10-CM

## 2022-04-26 ENCOUNTER — Ambulatory Visit (INDEPENDENT_AMBULATORY_CARE_PROVIDER_SITE_OTHER): Payer: 59

## 2022-04-26 DIAGNOSIS — R002 Palpitations: Secondary | ICD-10-CM

## 2022-04-26 NOTE — Telephone Encounter (Signed)
Please place a 3 day ZIO monitor to further evaluate. thanks ?

## 2022-04-26 NOTE — Telephone Encounter (Signed)
Called and spoke to patient to inform recommendation for Zio 3 day. Pt agrees with plan and requests it be mailed to her house. Order placed at this time.  ?

## 2022-04-26 NOTE — Progress Notes (Unsigned)
Enrolled patient for a 3 day Zio XT monitor to be mailed to patients home  

## 2022-04-30 DIAGNOSIS — R002 Palpitations: Secondary | ICD-10-CM | POA: Diagnosis not present

## 2022-05-03 ENCOUNTER — Other Ambulatory Visit: Payer: Self-pay

## 2022-05-07 ENCOUNTER — Inpatient Hospital Stay: Payer: 59 | Attending: Oncology

## 2022-05-07 DIAGNOSIS — Z79899 Other long term (current) drug therapy: Secondary | ICD-10-CM | POA: Diagnosis not present

## 2022-05-07 DIAGNOSIS — D649 Anemia, unspecified: Secondary | ICD-10-CM | POA: Insufficient documentation

## 2022-05-07 DIAGNOSIS — I10 Essential (primary) hypertension: Secondary | ICD-10-CM | POA: Diagnosis not present

## 2022-05-07 DIAGNOSIS — D509 Iron deficiency anemia, unspecified: Secondary | ICD-10-CM

## 2022-05-07 DIAGNOSIS — I4891 Unspecified atrial fibrillation: Secondary | ICD-10-CM | POA: Diagnosis not present

## 2022-05-07 DIAGNOSIS — M069 Rheumatoid arthritis, unspecified: Secondary | ICD-10-CM | POA: Diagnosis not present

## 2022-05-07 DIAGNOSIS — G4733 Obstructive sleep apnea (adult) (pediatric): Secondary | ICD-10-CM | POA: Insufficient documentation

## 2022-05-07 LAB — CBC WITH DIFFERENTIAL/PLATELET
Abs Immature Granulocytes: 0.02 10*3/uL (ref 0.00–0.07)
Basophils Absolute: 0 10*3/uL (ref 0.0–0.1)
Basophils Relative: 1 %
Eosinophils Absolute: 0.1 10*3/uL (ref 0.0–0.5)
Eosinophils Relative: 2 %
HCT: 37.6 % (ref 36.0–46.0)
Hemoglobin: 12.8 g/dL (ref 12.0–15.0)
Immature Granulocytes: 0 %
Lymphocytes Relative: 39 %
Lymphs Abs: 1.9 10*3/uL (ref 0.7–4.0)
MCH: 29.9 pg (ref 26.0–34.0)
MCHC: 34 g/dL (ref 30.0–36.0)
MCV: 87.9 fL (ref 80.0–100.0)
Monocytes Absolute: 0.5 10*3/uL (ref 0.1–1.0)
Monocytes Relative: 10 %
Neutro Abs: 2.4 10*3/uL (ref 1.7–7.7)
Neutrophils Relative %: 48 %
Platelets: 188 10*3/uL (ref 150–400)
RBC: 4.28 MIL/uL (ref 3.87–5.11)
RDW: 12.5 % (ref 11.5–15.5)
WBC: 5 10*3/uL (ref 4.0–10.5)
nRBC: 0 % (ref 0.0–0.2)

## 2022-05-07 LAB — FERRITIN: Ferritin: 42 ng/mL (ref 11–307)

## 2022-05-07 LAB — IRON AND TIBC
Iron: 65 ug/dL (ref 28–170)
Saturation Ratios: 16 % (ref 10.4–31.8)
TIBC: 398 ug/dL (ref 250–450)
UIBC: 333 ug/dL

## 2022-05-07 LAB — VITAMIN B12: Vitamin B-12: 3490 pg/mL — ABNORMAL HIGH (ref 180–914)

## 2022-05-07 LAB — FOLATE: Folate: 29 ng/mL (ref >5.9)

## 2022-05-08 DIAGNOSIS — R002 Palpitations: Secondary | ICD-10-CM | POA: Diagnosis not present

## 2022-05-08 LAB — KAPPA/LAMBDA LIGHT CHAINS
Kappa free light chain: 21.2 mg/L — ABNORMAL HIGH (ref 3.3–19.4)
Kappa, lambda light chain ratio: 1.56 (ref 0.26–1.65)
Lambda free light chains: 13.6 mg/L (ref 5.7–26.3)

## 2022-05-10 ENCOUNTER — Other Ambulatory Visit: Payer: Self-pay | Admitting: Internal Medicine

## 2022-05-10 ENCOUNTER — Other Ambulatory Visit: Payer: Self-pay | Admitting: Physician Assistant

## 2022-05-10 ENCOUNTER — Other Ambulatory Visit (HOSPITAL_COMMUNITY): Payer: Self-pay

## 2022-05-10 ENCOUNTER — Other Ambulatory Visit: Payer: Self-pay | Admitting: Cardiovascular Disease

## 2022-05-10 DIAGNOSIS — I10 Essential (primary) hypertension: Secondary | ICD-10-CM

## 2022-05-11 ENCOUNTER — Inpatient Hospital Stay (HOSPITAL_BASED_OUTPATIENT_CLINIC_OR_DEPARTMENT_OTHER): Payer: 59 | Admitting: Oncology

## 2022-05-11 ENCOUNTER — Other Ambulatory Visit: Payer: Self-pay

## 2022-05-11 DIAGNOSIS — D509 Iron deficiency anemia, unspecified: Secondary | ICD-10-CM

## 2022-05-11 LAB — MULTIPLE MYELOMA PANEL, SERUM
Albumin SerPl Elph-Mcnc: 4.3 g/dL (ref 2.9–4.4)
Albumin/Glob SerPl: 1.7 (ref 0.7–1.7)
Alpha 1: 0.3 g/dL (ref 0.0–0.4)
Alpha2 Glob SerPl Elph-Mcnc: 0.8 g/dL (ref 0.4–1.0)
B-Globulin SerPl Elph-Mcnc: 1 g/dL (ref 0.7–1.3)
Gamma Glob SerPl Elph-Mcnc: 0.5 g/dL (ref 0.4–1.8)
Globulin, Total: 2.6 g/dL (ref 2.2–3.9)
IgA: 142 mg/dL (ref 87–352)
IgG (Immunoglobin G), Serum: 538 mg/dL — ABNORMAL LOW (ref 586–1602)
IgM (Immunoglobulin M), Srm: 60 mg/dL (ref 26–217)
Total Protein ELP: 6.9 g/dL (ref 6.0–8.5)

## 2022-05-11 MED ORDER — POTASSIUM CHLORIDE ER 10 MEQ PO TBCR
10.0000 meq | EXTENDED_RELEASE_TABLET | Freq: Every day | ORAL | 3 refills | Status: DC
  Filled 2022-05-11: qty 90, 90d supply, fill #0
  Filled 2022-08-06: qty 90, 90d supply, fill #1
  Filled 2022-11-09: qty 90, 90d supply, fill #2
  Filled 2023-01-02 – 2023-02-07 (×2): qty 90, 90d supply, fill #3

## 2022-05-11 MED ORDER — LEFLUNOMIDE 20 MG PO TABS
ORAL_TABLET | Freq: Every day | ORAL | 0 refills | Status: DC
  Filled 2022-05-11: qty 30, 30d supply, fill #0

## 2022-05-11 NOTE — Telephone Encounter (Signed)
Next Visit: 07/19/2022 ? ?Last Visit: 02/20/2022 ? ?Last Fill: 01/29/2022 ? ?DX: Rheumatoid arthritis of multiple sites with negative rheumatoid factor ? ?Current Dose per office note 02/20/2022: Arava 20 mg 1 tablet by mouth daily ? ?Labs: 05/07/2022 CBC WNL, 01/11/2022 CMP: Glucose 108 ? ?Left message to advise patient she is due to update labs.  ? ?Okay to refill Arava?  ?

## 2022-05-12 NOTE — Progress Notes (Signed)
I connected with [name] on 05/12/22 at  3:00 PM EDT by video enabled telemedicine visit and verified that I am speaking with the correct person using two identifiers. ?  ?I discussed the limitations, risks, security and privacy concerns of performing an evaluation and management service by telemedicine and the availability of in-person appointments. I also discussed with the patient that there may be a patient responsible charge related to this service. The patient expressed understanding and agreed to proceed. ? ?Other persons participating in the visit and their role in the encounter:  none ? ?Patient's location:  work ?Provider's location:  work ? ?Chief Complaint:  discuss results of bloodwork and routine follow-up of anemia ? ?History of present illness:  Patient is a 56 year old female with past medical history significant for obstructive sleep apnea, type 1 diabetes, hypertension, rheumatoid arthritis among other medical problems.  She has been referred for anemia.  Her most recent CBC from 01/11/2022 showed white count of 4.6, H&H of 11.9/34.8 with an MCV of 88 and a platelet count of 222.  Iron studies showed a low iron saturation of 16% and a ferritin of 48 with a TIBC that was normal at 361.  TSH normal.  Patient has started taking oral iron about 2 to 3 weeks ago.  She denies any bleeding in her stool or urine.  She has had EGD and colonoscopy by Dr. Vicente Males in March 2021.  EGD was normal colonoscopy was also normal and a repeat colonoscopy was recommended in 5 years ?  ? ?Interval history patient is currently doing well and denies any specific concerns at this time ? ? ?Review of Systems  ?Constitutional:  Negative for chills, fever, malaise/fatigue and weight loss.  ?HENT:  Negative for congestion, ear discharge and nosebleeds.   ?Eyes:  Negative for blurred vision.  ?Respiratory:  Negative for cough, hemoptysis, sputum production, shortness of breath and wheezing.   ?Cardiovascular:  Negative for  chest pain, palpitations, orthopnea and claudication.  ?Gastrointestinal:  Negative for abdominal pain, blood in stool, constipation, diarrhea, heartburn, melena, nausea and vomiting.  ?Genitourinary:  Negative for dysuria, flank pain, frequency, hematuria and urgency.  ?Musculoskeletal:  Negative for back pain, joint pain and myalgias.  ?Skin:  Negative for rash.  ?Neurological:  Negative for dizziness, tingling, focal weakness, seizures, weakness and headaches.  ?Endo/Heme/Allergies:  Does not bruise/bleed easily.  ?Psychiatric/Behavioral:  Negative for depression and suicidal ideas. The patient does not have insomnia.   ? ?Allergies  ?Allergen Reactions  ? Actemra [Tocilizumab]   ? Ramipril Swelling, Rash and Other (See Comments)  ? Shellfish-Derived Products Swelling  ?  Shrimp ?  ? Atorvastatin Rash  ?  Elevated LFT's  ? Compazine  [Prochlorperazine Edisylate] Other (See Comments)  ?  Neurological reaction  ? Emgality [Galcanezumab-Gnlm] Hives and Swelling  ? Etanercept Swelling and Rash  ? Infliximab Rash  ? Iohexol   ?  Iv contrast dye -rash all over  ? Orencia [Abatacept] Rash  ? Prochlorperazine Edisylate   ?  unknown  ? Tofacitinib Rash and Other (See Comments)  ?  Severe abdominal pain ?  ? [facility]]   ?  Brain fog, memory issues, word finding issues  ? Tramadol   ?  Nausea ?  ? Amiodarone Nausea Only  ? Rituximab Rash  ?  Causes a rash  ? ? ?Past Medical History:  ?Diagnosis Date  ? ACUT MI ANTEROLAT WALL SUBSQT EPIS CARE   ? Acute maxillary sinusitis   ?  ALLERGIC RHINITIS, SEASONAL   ? Anemia   ? ARTHRITIS, RHEUMATOID   ? shoulders and hands Enbrel>leg swelling Dr. Estanislado Pandy  ? Atrial fibrillation ([place])   ? a. after CABG.  ? Back pain   ? Bruxism (teeth grinding)   ? CAD, ARTERY BYPASS GRAFT   ? a. DES to RCA in 2010 then LAD occlusion s/p CABG ?3 06/07/2009 with LIMA to LAD, reverse SVG to D1, reverse SVG to distal RCA. b. Cath 05/08/2016 slightly hypodense region in the intermediate  branch, however she had excellent flow, FFR was normal. Vein graft to PDA and the posterolateral branch is patent, patent LIMA to LAD, occluded SVG to diagonal.  ? CARPAL TUNNEL SYNDROME, BILATERAL   ? CHOLELITHIASIS   ? Contrast media allergy   ? DERMATITIS, ALLERGIC   ? DIABETES MELLITUS, TYPE I   ? on insulin pump dx'ed age 41 y.o   ? DIABETIC  RETINOPATHY   ? Hiatal hernia   ? HYPERLIPIDEMIA-MIXED   ? HYPERTHYROIDISM   ? Dr. Dollene Cleveland  ? IDA (iron deficiency anemia)   ? Insulin pump in place   ? Lymphadenopathy of head and neck   ? MIGRAINE W/O AURA W/INTRACT W/STATUS MIGRAINOSUS 02/19/2008  ? PONV (postoperative nausea and vomiting)   ? Psoriasis   ? SINUS TACHYCARDIA 11/08/2010  ? Sleep apnea   ? not using machine yet   ? SVT (supraventricular tachycardia) ([place])   ? after s/p CABG  ? TRIGGER FINGER   ? all fingers b/l hands   ? URI   ? ? ?Past Surgical History:  ?Procedure Laterality Date  ? ABDOMINAL HYSTERECTOMY    ? endometriomas b/l; total ? cervix removed; no h/o abnormal pap  ? Caesarean section    ? CARDIAC CATHETERIZATION N/A 05/08/2016  ? Procedure: Left Heart Cath and Cors/Grafts Angiography;  Surgeon: Burnell Blanks, MD;  Location: [place] [facility];  Service: Cardiovascular;  Laterality: N/A;  ? CARPAL TUNNEL RELEASE    ? b/l   ? CARPAL TUNNEL RELEASE Left 06/06/2021  ? Procedure: CARPAL TUNNEL RELEASE;  Surgeon: Daryll Brod, MD;  Location: [place];  Service: Orthopedics;  Laterality: Left;  AXILLARY BLOCK  ? CATARACT EXTRACTION, BILATERAL    ? CHOLECYSTECTOMY    ? COLONOSCOPY WITH PROPOFOL N/A 03/25/2020  ? Procedure: COLONOSCOPY WITH PROPOFOL;  Surgeon: Jonathon Bellows, MD;  Location: [facility] ENDOSCOPY;  Service: Gastroenterology;  Laterality: N/A;  ? CORONARY ARTERY BYPASS GRAFT    ? ESOPHAGOGASTRODUODENOSCOPY (EGD) WITH PROPOFOL N/A 03/25/2020  ? Procedure: ESOPHAGOGASTRODUODENOSCOPY (EGD) WITH PROPOFOL;  Surgeon: Jonathon Bellows, MD;  Location: [facility] ENDOSCOPY;  Service:  Gastroenterology;  Laterality: N/A;  ? EYE SURGERY    ? laser x 2 for retinopathy   ? LEFT HEART CATH AND CORS/GRAFTS ANGIOGRAPHY N/A 02/22/2020  ? Procedure: LEFT HEART CATH AND CORS/GRAFTS ANGIOGRAPHY;  Surgeon: Burnell Blanks, MD;  Location: [place] [facility];  Service: Cardiovascular;  Laterality: N/A;  ? TRIGGER FINGER RELEASE    ? b/l fingers all   ? ULNAR NERVE TRANSPOSITION Left 06/06/2021  ? Procedure: ULNAR NERVE DECOMPRESSION;  Surgeon: Daryll Brod, MD;  Location: [place];  Service: Orthopedics;  Laterality: Left;  AXILLARY BLOCK  ? VITRECTOMY    ? b/l   ? ? ?Social History  ? ?Socioeconomic History  ? Marital status: Divorced  ?  Spouse name: Not on file  ? Number of children: Not on file  ? Years of education: Not on  file  ? Highest education level: Not on file  ?Occupational History  ? Not on file  ?Tobacco Use  ? Smoking status: Never  ?  Passive exposure: Never  ? Smokeless tobacco: Never  ?Vaping Use  ? Vaping Use: Never used  ?Substance and Sexual Activity  ? Alcohol use: Yes  ?  Comment: rarely 1 every 6 months  ? Drug use: No  ? Sexual activity: Not Currently  ?  Partners: Male  ?  Birth control/protection: None  ?Other Topics Concern  ? Not on file  ?Social History Narrative  ? Divorced. Has 2 kids(fraternal twins) daughters age 23. Works at [employer], Never smoked, denies ETOH, no drugs. Drinks diet coke. No exercise.   ?   ? DPR daughters [name] and Lenox Ladouceur twins   ?   ?   ? ?Social Determinants of Health  ? ?Financial Resource Strain: Not on file  ?Food Insecurity: Not on file  ?Transportation Needs: Not on file  ?Physical Activity: Not on file  ?Stress: Not on file  ?Social Connections: Not on file  ?Intimate Partner Violence: Not on file  ? ? ?Family History  ?Problem Relation Age of [place]  ? Depression Mother   ? Anxiety disorder Mother   ? Colon polyps Father   ? Diabetes Father   ?     2  ? Hypertension Father   ? Parkinson's disease Father   ?  Stroke Paternal Grandmother   ? Heart disease Other   ? Hypertension Other   ? Hyperlipidemia Other   ? Depression Other   ? Migraines Other   ? Heart attack Neg Hx   ? Colon cancer Neg Hx   ? Stomach cancer Neg Hx

## 2022-05-14 ENCOUNTER — Other Ambulatory Visit: Payer: Self-pay

## 2022-05-14 ENCOUNTER — Other Ambulatory Visit: Payer: Self-pay | Admitting: Internal Medicine

## 2022-05-14 DIAGNOSIS — E10319 Type 1 diabetes mellitus with unspecified diabetic retinopathy without macular edema: Secondary | ICD-10-CM | POA: Diagnosis not present

## 2022-05-14 MED FILL — Cyclobenzaprine HCl Tab 10 MG: ORAL | 30 days supply | Qty: 30 | Fill #0 | Status: AC

## 2022-05-15 ENCOUNTER — Other Ambulatory Visit: Payer: Self-pay | Admitting: Cardiovascular Disease

## 2022-05-15 ENCOUNTER — Ambulatory Visit: Payer: 59 | Admitting: Internal Medicine

## 2022-05-15 ENCOUNTER — Encounter: Payer: Self-pay | Admitting: Internal Medicine

## 2022-05-15 VITALS — BP 120/62 | HR 96 | Temp 97.8°F | Resp 14 | Ht 63.0 in | Wt 176.2 lb

## 2022-05-15 DIAGNOSIS — E103599 Type 1 diabetes mellitus with proliferative diabetic retinopathy without macular edema, unspecified eye: Secondary | ICD-10-CM

## 2022-05-15 DIAGNOSIS — N3000 Acute cystitis without hematuria: Secondary | ICD-10-CM

## 2022-05-15 DIAGNOSIS — R5383 Other fatigue: Secondary | ICD-10-CM | POA: Diagnosis not present

## 2022-05-15 DIAGNOSIS — I152 Hypertension secondary to endocrine disorders: Secondary | ICD-10-CM

## 2022-05-15 DIAGNOSIS — I491 Atrial premature depolarization: Secondary | ICD-10-CM

## 2022-05-15 DIAGNOSIS — L989 Disorder of the skin and subcutaneous tissue, unspecified: Secondary | ICD-10-CM

## 2022-05-15 DIAGNOSIS — E1159 Type 2 diabetes mellitus with other circulatory complications: Secondary | ICD-10-CM

## 2022-05-15 DIAGNOSIS — I25118 Atherosclerotic heart disease of native coronary artery with other forms of angina pectoris: Secondary | ICD-10-CM

## 2022-05-15 DIAGNOSIS — E559 Vitamin D deficiency, unspecified: Secondary | ICD-10-CM | POA: Diagnosis not present

## 2022-05-15 DIAGNOSIS — Z1231 Encounter for screening mammogram for malignant neoplasm of breast: Secondary | ICD-10-CM

## 2022-05-15 DIAGNOSIS — Z951 Presence of aortocoronary bypass graft: Secondary | ICD-10-CM | POA: Diagnosis not present

## 2022-05-15 DIAGNOSIS — E0590 Thyrotoxicosis, unspecified without thyrotoxic crisis or storm: Secondary | ICD-10-CM

## 2022-05-15 DIAGNOSIS — R946 Abnormal results of thyroid function studies: Secondary | ICD-10-CM

## 2022-05-15 DIAGNOSIS — I10 Essential (primary) hypertension: Secondary | ICD-10-CM | POA: Diagnosis not present

## 2022-05-15 DIAGNOSIS — I493 Ventricular premature depolarization: Secondary | ICD-10-CM

## 2022-05-15 NOTE — Progress Notes (Signed)
Chief Complaint  ?Patient presents with  ? Follow-up  ?  6 mon, disc about fatigue. Denies any unusual pain.  ? ?F/u  ?Fatigue ?Sleep Is not consistent at night since a kid up and down due to urinating at night and never slept well since a kid ?Osa not using recently  ?Cad and recently hr with walking slowly went up to 129 metoprolol was increased 25 mg bid to 50 mg bid and she has f/u with Dr. Burt Knack sooner to determine if stress test needed s/p cabg and last cath in 2021 with +CAD ?H/o hyperthyroidism she does have endocrine f/u next week as well  ?She also has a h/o vitamin D def ?She does not think she is depressed ? ?Right scalp lesion using bactroban oint but was oozing pus at one time appt with Dr. Laurence Ferrari 06/27/22 previously had another scalp lesion biopsied  ? ? ? ?Review of Systems  ?Constitutional:  Positive for malaise/fatigue. Negative for weight loss.  ?HENT:  Negative for hearing loss.   ?Eyes:  Negative for blurred vision.  ?Respiratory:  Negative for shortness of breath.   ?Cardiovascular:  Negative for chest pain.  ?Gastrointestinal:  Negative for abdominal pain and blood in stool.  ?Genitourinary:  Negative for dysuria.  ?Musculoskeletal:  Negative for falls and joint pain.  ?Skin:  Negative for rash.  ?Neurological:  Negative for headaches.  ?Psychiatric/Behavioral:  Negative for depression.   ?Past Medical History:  ?Diagnosis Date  ? ACUT MI ANTEROLAT WALL SUBSQT EPIS CARE   ? Acute maxillary sinusitis   ? ALLERGIC RHINITIS, SEASONAL   ? Anemia   ? ARTHRITIS, RHEUMATOID   ? shoulders and hands Enbrel>leg swelling Dr. Estanislado Pandy  ? Atrial fibrillation ([place])   ? a. after CABG.  ? Back pain   ? Bruxism (teeth grinding)   ? CAD, ARTERY BYPASS GRAFT   ? a. DES to RCA in 2010 then LAD occlusion s/p CABG ?3 06/07/2009 with LIMA to LAD, reverse SVG to D1, reverse SVG to distal RCA. b. Cath 05/08/2016 slightly hypodense region in the intermediate branch, however she had excellent flow, FFR was normal. Vein graft  to PDA and the posterolateral branch is patent, patent LIMA to LAD, occluded SVG to diagonal.  ? CARPAL TUNNEL SYNDROME, BILATERAL   ? CHOLELITHIASIS   ? Contrast media allergy   ? DERMATITIS, ALLERGIC   ? DIABETES MELLITUS, TYPE I   ? on insulin pump dx'ed age 56 y.o   ? DIABETIC  RETINOPATHY   ? Hiatal hernia   ? HYPERLIPIDEMIA-MIXED   ? HYPERTHYROIDISM   ? Dr. Dollene Cleveland  ? IDA (iron deficiency anemia)   ? Insulin pump in place   ? Lymphadenopathy of head and neck   ? MIGRAINE W/O AURA W/INTRACT W/STATUS MIGRAINOSUS 02/19/2008  ? PONV (postoperative nausea and vomiting)   ? Psoriasis   ? SINUS TACHYCARDIA 11/08/2010  ? Sleep apnea   ? not using machine yet   ? SVT (supraventricular tachycardia) ([place])   ? after s/p CABG  ? TRIGGER FINGER   ? all fingers b/l hands   ? URI   ? ?Past Surgical History:  ?Procedure Laterality Date  ? ABDOMINAL HYSTERECTOMY    ? endometriomas b/l; total ? cervix removed; no h/o abnormal pap  ? Caesarean section    ? CARDIAC CATHETERIZATION N/A 05/08/2016  ? Procedure: Left Heart Cath and Cors/Grafts Angiography;  Surgeon: Burnell Blanks, MD;  Location: [place] [facility];  Service: Cardiovascular;  Laterality: N/A;  ? CARPAL TUNNEL RELEASE    ? b/l   ? CARPAL TUNNEL RELEASE Left 06/06/2021  ? Procedure: CARPAL TUNNEL RELEASE;  Surgeon: Daryll Brod, MD;  Location: [place];  Service: Orthopedics;  Laterality: Left;  AXILLARY BLOCK  ? CATARACT EXTRACTION, BILATERAL    ? CHOLECYSTECTOMY    ? COLONOSCOPY WITH PROPOFOL N/A 03/25/2020  ? Procedure: COLONOSCOPY WITH PROPOFOL;  Surgeon: Jonathon Bellows, MD;  Location[facility] END[facility];  Service: Gastroenterology;  Laterality: N/A;  ? CORONARY ARTERY BYPASS GRAFT    ? ESOPHAGOGASTRODUODENOSCOPY (EGD) WITH PROPOFOL N/A 03/25/2020  ? Procedure: ESOPHAGOGASTRODUODENOSCOPY (EGD) WITH PROPOFOL;  Surgeon: Jonathon Bellows, MD;  Location: [facility] ENDOSCOPY;  Service: Gastroenterology;  Laterality: N/A;  ? EYE SURGERY    ? laser x 2 for  retinopathy   ? LEFT HEART CATH AND CORS/GRAFTS ANGIOGRAPHY N/A 02/22/2020  ? Procedure: LEFT HEART CATH AND CORS/GRAFTS ANGIOGRAPHY;  Surgeon: Burnell Blanks, MD;  Location: [place] [facility];  Service: Cardiovascular;  Laterality: N/A;  ? TRIGGER FINGER RELEASE    ? b/l fingers all   ? ULNAR NERVE TRANSPOSITION Left 06/06/2021  ? Procedure: ULNAR NERVE DECOMPRESSION;  Surgeon: Daryll Brod, MD;  Location: [place];  Service: Orthopedics;  Laterality: Left;  AXILLARY BLOCK  ? VITRECTOMY    ? b/l   ? ?Family History  ?Problem Relation Age of [place]  ? Depression Mother   ? Anxiety disorder Mother   ? Colon polyps Father   ? Diabetes Father   ?     2  ? Hypertension Father   ? Parkinson's disease Father   ? Stroke Paternal Grandmother   ? Heart disease Other   ? Hypertension Other   ? Hyperlipidemia Other   ? Depression Other   ? Migraines Other   ? Heart attack Neg Hx   ? Colon cancer Neg Hx   ? Stomach cancer Neg Hx   ? Esophageal cancer Neg Hx   ? ?Social History  ? ?Socioeconomic History  ? Marital status: Divorced  ?  Spouse name: Not on file  ? Number of children: Not on file  ? Years of education: Not on file  ? Highest education level: Not on file  ?Occupational History  ? Not on file  ?Tobacco Use  ? Smoking status: Never  ?  Passive exposure: Never  ? Smokeless tobacco: Never  ?Vaping Use  ? Vaping Use: Never used  ?Substance and Sexual Activity  ? Alcohol use: Yes  ?  Comment: rarely 1 every 6 months  ? Drug use: No  ? Sexual activity: Not Currently  ?  Partners: Male  ?  Birth control/protection: None  ?Other Topics Concern  ? Not on file  ?Social History Narrative  ? Divorced. Has 2 kids(fraternal twins) daughters age 26. Works at [employer], Never smoked, denies ETOH, no drugs. Drinks diet coke. No exercise.   ?   ? DPR daughters [name] and Linsy Ehresman twins   ?   ?   ? ?Social Determinants of Health  ? ?Financial Resource Strain: Not on file  ?Food Insecurity: Not on  file  ?Transportation Needs: Not on file  ?Physical Activity: Not on file  ?Stress: Not on file  ?Social Connections: Not on file  ?Intimate Partner Violence: Not on file  ? ?Current Meds  ?Medication Sig  ? albuterol (VENTOLIN HFA) 108 (90 Base) MCG/ACT inhaler Inhale 2 puffs into the lungs every 4 (four) hours  as needed for wheezing or shortness of breath.  ? aspirin EC 81 MG tablet Take 81 mg by mouth daily.  ? Blood Glucose Monitoring Suppl (FREESTYLE FREEDOM LITE) w/Device KIT Use as directed by physician to check blood sugar  ? CHELATED MAGNESIUM PO Take 250 mg by mouth daily.  ? Continuous Blood Gluc Receiver (DEXCOM G6 RECEIVER) DEVI Dispense and use as directed  ? Continuous Blood Gluc Sensor (DEXCOM G6 SENSOR) MISC Dispense and use as directed.  Change sensor every 10 days.  ? Continuous Blood Gluc Transmit (DEXCOM G6 TRANSMITTER) MISC Dispense and use as directed.  Change transmitter every 90 days.  ? cyclobenzaprine (FLEXERIL) 10 MG tablet TAKE ONE TABLET BY MOUTH AT BEDTIME.  ? EPINEPHrine 0.3 mg/0.3 mL IJ SOAJ injection Inject 0.3 mg into the muscle as needed for anaphylaxis.  ? glucose blood (FREESTYLE LITE) test strip Use to check blood sugar 3 time(s) daily  ? insulin lispro (HUMALOG) 100 UNIT/ML injection INJECT UP TO 90 UNITS DAILY IN INSULIN PUMP  ? Insulin Syringe-Needle U-100 (BD INSULIN SYRINGE U/F) 31G X 5/16" 0.3 ML MISC use with insulin injections 3 times daily in the event of insulin pump failure.  ? ipratropium (ATROVENT) 0.06 % nasal spray Place 2 sprays into both nostrils 4 (four) times daily as needed for rhinitis.  ? Iron, Ferrous Sulfate, 325 (65 Fe) MG TABS Take 325 mg by mouth daily.  ? isosorbide mononitrate (IMDUR) 60 MG 24 hr tablet TAKE 1 TABLET BY MOUTH DAILY.  ? Krill Oil 500 MG CAPS Take 500 mg by mouth daily.  ? Lancets (FREESTYLE) lancets Use to check blood sugar 3 time(s) daily.  ? leflunomide (ARAVA) 20 MG tablet TAKE 1 TABLET BY MOUTH DAILY  ? linaclotide (LINZESS) 290  MCG CAPS capsule Take 1 capsule (290 mcg total) by mouth daily before breakfast.  ? losartan (COZAAR) 100 MG tablet TAKE 1 TABLET BY MOUTH DAILY  ? metoprolol tartrate (LOPRESSOR) 50 MG tablet Take 0.5 table

## 2022-05-15 NOTE — Telephone Encounter (Signed)
Spoke with Dr Burt Knack in clinic and asked him to look over her monitor. He stated that it was benign and I could call patient. Returned call to patient today who states that she has seen the results and really was wanting to know if she should continue taking Metoprolol Tartrate '50mg'$  twice daily (she was increased due to these palpitations-previously on '25mg'$  BID) and if it was safe for her to exercise. She states that she's still having the episodes of tachycardia that occur randomly when at rest (heart rate as high as 129bpm), but every time with any amount of activity, and last approx 1 min. Pt condones SOB with each episode that she describes as "feeling like I can't catch my breath" and "chest heaviness." No other complaints. Last stress test was done in 2019, discussed with patient that we may need to repeat so that we can rule out exercise-induced arrhythmias/ischemia, which she was a proponent for. She also states that she has graves disease, but there levels intermittently drop significantly as well, so she is scheduled to have TSH checked on 05/23/22 with her endocrinologist. Will route to MD for review. ?

## 2022-05-15 NOTE — Addendum Note (Signed)
Addended by: Ma Hillock on: 05/15/2022 04:42 PM ? ? Modules accepted: Orders ? ?

## 2022-05-15 NOTE — Telephone Encounter (Signed)
Order placed at this time and pt called to make aware. ?

## 2022-05-15 NOTE — Patient Instructions (Addendum)
?repeat stress and heart cath? ?-cards Dr. Burt Knack ?Use your cpap  ? ?Fatigue ?If you have fatigue, you feel tired all the time and have a lack of energy or a lack of motivation. Fatigue may make it difficult to start or complete tasks because of exhaustion. ?Occasional or mild fatigue is often a normal response to activity or life. However, long-term (chronic) or extreme fatigue may be a symptom of a medical condition such as: ?Depression. ?Not having enough red blood cells or hemoglobin in the blood (anemia). ?A problem with a small gland located in the lower front part of the neck (thyroid disorder). ?Rheumatologic conditions. These are problems related to the body's defense system (immune system). ?Infections, especially certain viral infections. ?Fatigue can also lead to negative health outcomes over time. ?Follow these instructions at home: ?Medicines ?Take over-the-counter and prescription medicines only as told by your health care provider. ?Take a multivitamin if told by your health care provider. ?Do not use herbal or dietary supplements unless they are approved by your health care provider. ?Eating and drinking ? ?Avoid heavy meals in the evening. ?Eat a well-balanced diet, which includes lean proteins, whole grains, plenty of fruits and vegetables, and low-fat dairy products. ?Avoid eating or drinking too many products with caffeine in them. ?Avoid alcohol. ?Drink enough fluid to keep your urine pale yellow. ?Activity ? ?Exercise regularly, as told by your health care provider. ?Use or practice techniques to help you relax, such as yoga, tai chi, meditation, or massage therapy. ?Lifestyle ?Change situations that cause you stress. Try to keep your work and personal schedules in balance. ?Do not use recreational or illegal drugs. ?General instructions ?Monitor your fatigue for any changes. ?Go to bed and get up at the same time every day. ?Avoid fatigue by pacing yourself during the day and getting enough  sleep at night. ?Maintain a healthy weight. ?Contact a health care provider if: ?Your fatigue does not get better. ?You have a fever. ?You suddenly lose or gain weight. ?You have headaches. ?You have trouble falling asleep or sleeping through the night. ?You feel angry, guilty, anxious, or sad. ?You have swelling in your legs or another part of your body. ?Get help right away if: ?You feel confused, feel like you might faint, or faint. ?Your vision is blurry or you have a severe headache. ?You have severe pain in your abdomen, your back, or the area between your waist and hips (pelvis). ?You have chest pain, shortness of breath, or an irregular or fast heartbeat. ?You are unable to urinate, or you urinate less than normal. ?You have abnormal bleeding from the rectum, nose, lungs, nipples, or, if you are female, the vagina. ?You vomit blood. ?You have thoughts about hurting yourself or others. ?These symptoms may be an emergency. Get help right away. Call 911. ?Do not wait to see if the symptoms will go away. ?Do not drive yourself to the hospital. ?Get help right away if you feel like you may hurt yourself or others, or have thoughts about taking your own life. Go to your nearest emergency room or: ?Call 911. ?Call the [place] at [phone] or [phone]. This is open 24 hours a day. ?Text the Crisis Text Line at 4450370230. ?Summary ?If you have fatigue, you feel tired all the time and have a lack of energy or a lack of motivation. ?Fatigue may make it difficult to start or complete tasks because of exhaustion. ?Long-term (chronic) or extreme fatigue may be a symptom  of a medical condition. ?Exercise regularly, as told by your health care provider. ?Change situations that cause you stress. Try to keep your work and personal schedules in balance. ?This information is not intended to replace advice given to you by your health care provider. Make sure you discuss any questions you have with your  health care provider. ?Document Revised: 10/09/2021 Document Reviewed: 10/09/2021 ?Elsevier Patient Education ? [place]. ? ?

## 2022-05-15 NOTE — Telephone Encounter (Signed)
Thx Sarah. I reviewed your notes and agree on stress testing. Please order an exercise Myoview stress test. thanks ?

## 2022-05-16 LAB — URINE CULTURE
MICRO NUMBER:: 13402530
SPECIMEN QUALITY:: ADEQUATE

## 2022-05-16 LAB — URINALYSIS, ROUTINE W REFLEX MICROSCOPIC
Bilirubin Urine: NEGATIVE
Glucose, UA: NEGATIVE
Hgb urine dipstick: NEGATIVE
Ketones, ur: NEGATIVE
Leukocytes,Ua: NEGATIVE
Nitrite: NEGATIVE
Protein, ur: NEGATIVE
Specific Gravity, Urine: 1.005 (ref 1.001–1.035)
pH: 5.5 (ref 5.0–8.0)

## 2022-05-16 LAB — COMPREHENSIVE METABOLIC PANEL
ALT: 19 U/L (ref 0–35)
AST: 22 U/L (ref 0–37)
Albumin: 4.5 g/dL (ref 3.5–5.2)
Alkaline Phosphatase: 63 U/L (ref 39–117)
BUN: 16 mg/dL (ref 6–23)
CO2: 30 mEq/L (ref 19–32)
Calcium: 8.9 mg/dL (ref 8.4–10.5)
Chloride: 100 mEq/L (ref 96–112)
Creatinine, Ser: 0.97 mg/dL (ref 0.40–1.20)
GFR: 66.23 mL/min (ref >60.00)
Glucose, Bld: 125 mg/dL — ABNORMAL HIGH (ref 70–99)
Potassium: 4 mEq/L (ref 3.5–5.1)
Sodium: 137 mEq/L (ref 135–145)
Total Bilirubin: 0.3 mg/dL (ref 0.2–1.2)
Total Protein: 6.8 g/dL (ref 6.0–8.3)

## 2022-05-16 LAB — TSH: TSH: 1.03 u[IU]/mL (ref 0.35–5.50)

## 2022-05-16 LAB — T4, FREE: Free T4: 0.9 ng/dL (ref 0.60–1.60)

## 2022-05-16 LAB — VITAMIN D 25 HYDROXY (VIT D DEFICIENCY, FRACTURES): VITD: 80.3 ng/mL (ref 30.00–100.00)

## 2022-05-16 LAB — HEMOGLOBIN A1C: Hgb A1c MFr Bld: 7 % — ABNORMAL HIGH (ref 4.6–6.5)

## 2022-05-16 LAB — T3, FREE: T3, Free: 2.6 pg/mL (ref 2.3–4.2)

## 2022-05-17 ENCOUNTER — Ambulatory Visit: Payer: 59 | Admitting: Physician Assistant

## 2022-05-18 ENCOUNTER — Telehealth (HOSPITAL_COMMUNITY): Payer: Self-pay | Admitting: Radiology

## 2022-05-18 NOTE — Telephone Encounter (Signed)
Patient given detailed instructions per Myocardial Perfusion Study Information Sheet for the test on 05/21/2022 at 8:00. Patient notified to arrive 15 minutes early and that it is imperative to arrive on time for appointment to keep from having the test rescheduled.  If you need to cancel or reschedule your appointment, please call the office within 24 hours of your appointment. . Patient verbalized understanding.EHK

## 2022-05-21 ENCOUNTER — Other Ambulatory Visit (HOSPITAL_COMMUNITY): Payer: Self-pay

## 2022-05-21 ENCOUNTER — Ambulatory Visit (HOSPITAL_COMMUNITY): Payer: 59 | Attending: Internal Medicine

## 2022-05-21 DIAGNOSIS — R002 Palpitations: Secondary | ICD-10-CM | POA: Diagnosis not present

## 2022-05-21 LAB — MYOCARDIAL PERFUSION IMAGING
Estimated workload: 7
Exercise duration (min): 6 min
Exercise duration (sec): 0 s
LV dias vol: 48 mL (ref 46–106)
LV sys vol: 14 mL
MPHR: 166 {beats}/min
Nuc Stress EF: 71 %
Peak HR: 146 {beats}/min
Percent HR: 87 %
Rest HR: 76 {beats}/min
Rest Nuclear Isotope Dose: 10.2 mCi
SDS: 0
SRS: 2
SSS: 2
Stress Nuclear Isotope Dose: 31.9 mCi
TID: 0.95

## 2022-05-21 MED ORDER — TECHNETIUM TC 99M TETROFOSMIN IV KIT
10.2000 | PACK | Freq: Once | INTRAVENOUS | Status: AC | PRN
  Administered 2022-05-21: 10.2 via INTRAVENOUS

## 2022-05-21 MED ORDER — TECHNETIUM TC 99M TETROFOSMIN IV KIT
31.9000 | PACK | Freq: Once | INTRAVENOUS | Status: AC | PRN
  Administered 2022-05-21: 31.9 via INTRAVENOUS

## 2022-05-22 ENCOUNTER — Telehealth: Payer: Self-pay

## 2022-05-22 DIAGNOSIS — E10319 Type 1 diabetes mellitus with unspecified diabetic retinopathy without macular edema: Secondary | ICD-10-CM | POA: Diagnosis not present

## 2022-05-22 DIAGNOSIS — E0500 Thyrotoxicosis with diffuse goiter without thyrotoxic crisis or storm: Secondary | ICD-10-CM | POA: Diagnosis not present

## 2022-05-22 NOTE — Telephone Encounter (Signed)
-----   Message from Sherren Mocha, MD sent at 05/21/2022 10:06 PM EDT ----- Normal study with normal perfusion, normal LV function. Excellent result.

## 2022-05-22 NOTE — Telephone Encounter (Signed)
Called patient with results. She understands, but stated that her HR is still increasing tremendously with minimal exercise. States that just walking in her yard yesterday at a normal pace caused her HR to reach 130's. It goes back down as soon as she rests and she has no CP or lingering SOB (condones SOB when HR shoots up suddenly, but goes away with rest). States she has doubled her Metoprolol Tartrate and uses '50mg'$  twice daily, but no real relief. She wants to be able to resume exercising and would like Dr Burt Knack to advise on this or potential medication changes that may help her reach her goal. Will route to [place].

## 2022-05-23 ENCOUNTER — Other Ambulatory Visit: Payer: Self-pay

## 2022-05-23 MED ORDER — METOPROLOL TARTRATE 50 MG PO TABS
50.0000 mg | ORAL_TABLET | Freq: Two times a day (BID) | ORAL | 3 refills | Status: DC
  Filled 2022-05-23: qty 180, 90d supply, fill #0
  Filled 2022-08-23: qty 180, 90d supply, fill #1
  Filled 2022-11-09: qty 180, 90d supply, fill #2

## 2022-05-23 NOTE — Telephone Encounter (Signed)
Called patient and reviewed Dr Antionette Char comments. She understands. Will continue on her Metoprolol at '50mg'$  twice daily and will begin to exercise as tolerated.

## 2022-05-23 NOTE — Telephone Encounter (Signed)
Recent monitor showed a maximum HR of 110 bpm. To date cardiac testing has been negative. Stress test showed maximum HR of 146 bpm occurring at 6 minutes on the Bruce protocol. I don't know what else to suggest other than a slowly progressive, graded exercise program. Would continue on her current Rx.

## 2022-05-23 NOTE — Addendum Note (Signed)
Addended by: Ma Hillock on: 05/23/2022 04:56 PM   Modules accepted: Orders

## 2022-05-24 ENCOUNTER — Ambulatory Visit: Payer: 59 | Attending: Internal Medicine | Admitting: Pharmacist

## 2022-05-24 ENCOUNTER — Other Ambulatory Visit: Payer: Self-pay

## 2022-05-24 DIAGNOSIS — Z79899 Other long term (current) drug therapy: Secondary | ICD-10-CM

## 2022-05-24 MED ORDER — OZEMPIC (1 MG/DOSE) 4 MG/3ML ~~LOC~~ SOPN
1.0000 mg | PEN_INJECTOR | SUBCUTANEOUS | 5 refills | Status: DC
  Filled 2022-05-24: qty 3, 28d supply, fill #0
  Filled 2022-06-20: qty 9, 84d supply, fill #1
  Filled 2022-09-20: qty 6, 56d supply, fill #2

## 2022-05-24 NOTE — Progress Notes (Signed)
  S: Patient presents today for review of their specialty medication.   Patient is currently taking Rinvoq for rheumatoid arthritis. Patient is managed by [facility]el [facility] for this.   Dosing: Adult  Note: May be used as monotherapy or in combination with methotrexate or other nonbiologic disease-modifying antirheumatic drugs (DMARDs); use in combination with biologic DMARDS or potent immunosuppressants (eg, azathioprine, cyclosporine) is not recommended. Do not initiate therapy in patients with an absolute lymphocyte count <500/mm3, ANC <1,000/mm3, or hemoglobin <8 g/dL. Rheumatoid arthritis: Oral: 15 mg once daily.  Adherence: confirmed   Efficacy: reports that she continues to do well with this medication.    Monitoring: S/sx thromboembolism: denies  S/sx malignancy: denies  S/sx of infection: denies   Current adverse effects: denies   O:   Lab Results  Component Value Date   WBC 5.0 05/07/2022   HGB 12.8 05/07/2022   HCT 37.6 05/07/2022   MCV 87.9 05/07/2022   PLT 188 05/07/2022      Chemistry      Component Value Date/Time   NA 137 05/15/2022 1557   NA 139 02/15/2021 1141   K 4.0 05/15/2022 1557   CL 100 05/15/2022 1557   CO2 30 05/15/2022 1557   BUN 16 05/15/2022 1557   BUN 15 02/15/2021 1141   CREATININE 0.97 05/15/2022 1557   CREATININE 1.03 10/31/2021 1416   GLU 119 07/16/2016 0000      Component Value Date/Time   CALCIUM 8.9 05/15/2022 1557   ALKPHOS 63 05/15/2022 1557   AST 22 05/15/2022 1557   ALT 19 05/15/2022 1557   BILITOT 0.3 05/15/2022 1557      Lab Results  Component Value Date   CHOL 144 05/23/2020   HDL 57.70 05/23/2020   LDLCALC 61 05/23/2020   TRIG 123.0 05/23/2020   CHOLHDL 2 05/23/2020    A/P: 1. Medication review: patient currently taking Rinvoq for rheumatoid arthritis and doing well on the medication. Reviewed the medication with the patient, including the following: Rinvoq is a medication used to treat rheumatoid arthritis.  Administer with or without food. Swallow tablet whole; do not crush, split, or chew. Possible adverse effects include increased risk of infection, GI upset, hematologic toxicity, hepatic effects, lipid abnormalities, increased risk of malignancy, thromboembolism. Avoid live vaccinations. No recommendations for any changes.  Benard Halsted, PharmD, [facility], [place] [phone]

## 2022-06-03 ENCOUNTER — Other Ambulatory Visit: Payer: Self-pay

## 2022-06-04 ENCOUNTER — Other Ambulatory Visit: Payer: Self-pay

## 2022-06-05 ENCOUNTER — Telehealth: Payer: Self-pay

## 2022-06-05 ENCOUNTER — Other Ambulatory Visit: Payer: Self-pay | Admitting: Internal Medicine

## 2022-06-05 ENCOUNTER — Other Ambulatory Visit: Payer: Self-pay

## 2022-06-05 ENCOUNTER — Other Ambulatory Visit (HOSPITAL_COMMUNITY): Payer: Self-pay

## 2022-06-05 DIAGNOSIS — M0609 Rheumatoid arthritis without rheumatoid factor, multiple sites: Secondary | ICD-10-CM

## 2022-06-05 MED ORDER — AMOXICILLIN 500 MG PO CAPS
ORAL_CAPSULE | ORAL | 0 refills | Status: DC
  Filled 2022-06-05: qty 40, 10d supply, fill #0

## 2022-06-05 MED ORDER — RINVOQ 15 MG PO TB24
15.0000 mg | ORAL_TABLET | Freq: Every day | ORAL | 2 refills | Status: DC
  Filled ????-??-??: fill #0

## 2022-06-05 MED ORDER — CHLORHEXIDINE GLUCONATE 0.12 % MT SOLN
OROMUCOSAL | 12 refills | Status: DC
Start: 2022-06-05 — End: 2022-07-19
  Filled 2022-06-05: qty 473, 15d supply, fill #0

## 2022-06-05 NOTE — Telephone Encounter (Signed)
Next Visit: 07/19/2022   Last Visit: 02/20/2022   Last Fill: 03/15/2022  DX: Rheumatoid arthritis of multiple sites with negative rheumatoid factor   Current Dose per office note 02/20/2022:  Labs: 05/07/2022 CBC WNL 05/15/2022 CMP Glucose 125  TB Gold: 10/31/2021 Neg   Okay to refill Rinvoq?

## 2022-06-05 NOTE — Telephone Encounter (Signed)
LVM for patient to see if we could move her appt with Dr. Laurence Ferrari to 6/22.  Lurlean Horns., RMA

## 2022-06-07 ENCOUNTER — Other Ambulatory Visit: Payer: Self-pay | Admitting: Pharmacist

## 2022-06-07 ENCOUNTER — Other Ambulatory Visit (HOSPITAL_COMMUNITY): Payer: Self-pay

## 2022-06-07 DIAGNOSIS — M0609 Rheumatoid arthritis without rheumatoid factor, multiple sites: Secondary | ICD-10-CM

## 2022-06-07 MED ORDER — RINVOQ 15 MG PO TB24
15.0000 mg | ORAL_TABLET | Freq: Every day | ORAL | 2 refills | Status: DC
  Filled 2022-06-07: qty 30, 30d supply, fill #0
  Filled 2022-07-06: qty 30, 30d supply, fill #1

## 2022-06-14 ENCOUNTER — Other Ambulatory Visit (HOSPITAL_COMMUNITY): Payer: Self-pay

## 2022-06-15 ENCOUNTER — Other Ambulatory Visit: Payer: Self-pay

## 2022-06-15 MED FILL — Cyclobenzaprine HCl Tab 10 MG: ORAL | 30 days supply | Qty: 30 | Fill #1 | Status: AC

## 2022-06-20 ENCOUNTER — Other Ambulatory Visit: Payer: Self-pay | Admitting: Physician Assistant

## 2022-06-20 ENCOUNTER — Other Ambulatory Visit: Payer: Self-pay

## 2022-06-20 MED ORDER — LEFLUNOMIDE 20 MG PO TABS
ORAL_TABLET | Freq: Every day | ORAL | 0 refills | Status: DC
  Filled 2022-06-20: qty 90, 90d supply, fill #0

## 2022-06-20 NOTE — Telephone Encounter (Signed)
Next Visit: 07/19/2022  Last Visit: 02/20/2022  Last Fill: 05/11/2022  DX: Rheumatoid arthritis of multiple sites with negative rheumatoid factor   Current Dose per office note 02/20/2022: Arava 20 mg 1 tablet by mouth daily  Labs: 05/15/2022, Glusoe 125,   Okay to refill Miranda?

## 2022-06-22 ENCOUNTER — Other Ambulatory Visit: Payer: Self-pay

## 2022-06-22 ENCOUNTER — Other Ambulatory Visit: Payer: Self-pay | Admitting: Cardiovascular Disease

## 2022-06-22 MED FILL — Rosuvastatin Calcium Tab 20 MG: ORAL | 90 days supply | Qty: 90 | Fill #0 | Status: AC

## 2022-06-25 ENCOUNTER — Other Ambulatory Visit: Payer: Self-pay

## 2022-06-26 ENCOUNTER — Other Ambulatory Visit: Payer: Self-pay

## 2022-06-27 ENCOUNTER — Other Ambulatory Visit: Payer: Self-pay

## 2022-06-27 ENCOUNTER — Ambulatory Visit: Payer: 59 | Admitting: Dermatology

## 2022-06-27 DIAGNOSIS — L720 Epidermal cyst: Secondary | ICD-10-CM | POA: Diagnosis not present

## 2022-06-27 DIAGNOSIS — D492 Neoplasm of unspecified behavior of bone, soft tissue, and skin: Secondary | ICD-10-CM

## 2022-06-27 DIAGNOSIS — L732 Hidradenitis suppurativa: Secondary | ICD-10-CM | POA: Diagnosis not present

## 2022-06-27 DIAGNOSIS — L739 Follicular disorder, unspecified: Secondary | ICD-10-CM | POA: Diagnosis not present

## 2022-06-27 MED ORDER — MUPIROCIN 2 % EX OINT
1.0000 | TOPICAL_OINTMENT | CUTANEOUS | 0 refills | Status: DC
  Filled 2022-06-27: qty 22, 10d supply, fill #0

## 2022-06-27 MED ORDER — CLINDAMYCIN PHOSPHATE 1 % EX SOLN
Freq: Every day | CUTANEOUS | 3 refills | Status: DC
  Filled 2022-06-27: qty 60, 20d supply, fill #0
  Filled 2022-08-06: qty 60, 20d supply, fill #1
  Filled 2023-02-21: qty 60, 20d supply, fill #2

## 2022-06-27 MED ORDER — KETOCONAZOLE 2 % EX SHAM
1.0000 | MEDICATED_SHAMPOO | CUTANEOUS | 2 refills | Status: DC
  Filled 2022-06-27: qty 120, 20d supply, fill #0
  Filled 2022-08-06: qty 120, 20d supply, fill #1
  Filled 2023-02-21: qty 120, 20d supply, fill #2

## 2022-06-27 NOTE — Progress Notes (Signed)
Follow-Up Visit   Subjective  [name] is a 56 y.o. female who presents for the following: check bumps (Scalp, few months, painful, pt picks at), growth (R axilla, pt said it will open sometimes with white stuff that comes out), and check spot (R inner thigh, 23m burns but never opens up).   The following portions of the chart were reviewed this encounter and updated as appropriate:   Tobacco  Allergies  Meds  Problems  Med Hx  Surg Hx  Fam Hx      Review of Systems:  No other skin or systemic complaints except as noted in HPI or Assessment and Plan.  Objective  Well appearing patient in no apparent distress; mood and affect are within normal limits.  A focused examination was performed including scalp, bil axilla, breast, groin. Relevant physical exam findings are noted in the Assessment and Plan.  Right Axilla Small subcutaneous papule 0.6cm  R post thigh 1.3cm hyperpigmented sq nodule  scalp Few scattered superficial ulcerations and excoriations, rare perifollicular erythematous papule  buttocks, breast, groin Few scattered scars buttocks, rare scar and double open comedone inguinal folds    Assessment & Plan  Epidermal inclusion cyst Right Axilla  Cyst with symptoms and/or recent change.  Discussed surgical excision to remove, including resulting scar and possible recurrence.  Patient will schedule for surgery. Pre-op information given.    Neoplasm of skin R post thigh  R/O Cyst vs other Plan punch bx at f/u  Folliculitis scalp  Start Ketoconazole 2% shampoo 3x/wk, let sit 10 minutes and rinse out Start Clindamycin solution qd to aa bumps on scalp Start Mupirocin oint bid/tid to open sores on scalp  May consider Doxycyline in future  ketoconazole (NIZORAL) 2 % shampoo - scalp Apply 1 Application topically 3 (three) times a week. Wash scalp 3 times weekly, leave in for 10 minutes then rinse out  clindamycin (CLEOCIN T) 1 % external solution  - scalp Apply topically daily. Qd to scalp prn flares, qd to aa bumps buttocks, breast, inguinal folds  mupirocin ointment (BACTROBAN) 2 % - scalp Apply 1 Application topically as directed. Bid to tid to open sores in scalp  Hidradenitis suppurativa buttocks, breast, groin  Chronic and persistent condition with duration or expected duration over one year. Condition is symptomatic/ bothersome to patient. Not currently at goal.  Hidradenitis Suppurativa is a chronic; persistent; non-curable, but treatable condition due to abnormal inflamed sweat glands in the body folds (axilla, inframammary, groin, medial thighs), causing recurrent painful draining cysts and scarring. It can be associated with severe scarring acne and cysts; also abscesses and scarring of scalp. The goal is control and prevention of flares, as it is not curable. Scars are permanent and can be thickened. Treatment may include daily use of topical medication and oral antibiotics.  Oral isotretinoin may also be helpful.  For more severe cases, Humira (a biologic injection) may be prescribed to decrease the inflammatory process and prevent flares.  When indicated, inflamed cysts may also be treated surgically.   Start Hibiclense vs Cln Sport wash qd, samples of Cln bodywash given Start Clindamycin solution qd to aa buttocks, breast, inguinal folds  Consider doxycycline and/or spironolactone if not clearing with clindamycin   Return for surgery for cyst R axilla and punch bx R post thigh at same time, 32m/u folliculitis, HS.  I, Sonya Hupman, RMA, am acting as scribe for VIForest GleasonMD .  Documentation: I have reviewed the above documentation for  accuracy and completeness, and I agree with the above.  Forest Gleason, MD

## 2022-06-27 NOTE — Patient Instructions (Addendum)
 Pre-Operative Instructions  You are scheduled for a surgical procedure at [facility]. We recommend you read the following instructions. If you have any questions or concerns, please call the office at [phone].  Shower and wash the entire body with soap and water the day of your surgery paying special attention to cleansing at and around the planned surgery site.  Avoid aspirin or aspirin containing products at least fourteen (14) days prior to your surgical procedure and for at least one week (7 Days) after your surgical procedure. If you take aspirin on a regular basis for heart disease or history of stroke or for any other reason, we may recommend you continue taking aspirin but please notify us if you take this on a regular basis. Aspirin can cause more bleeding to occur during surgery as well as prolonged bleeding and bruising after surgery.   Avoid other nonsteroidal pain medications at least one week prior to surgery and at least one week prior to your surgery. These include medications such as Ibuprofen (Motrin, Advil and Nuprin), Naprosyn, Voltaren, Relafen, etc. If medications are used for therapeutic reasons, please inform us as they can cause increased bleeding or prolonged bleeding during and bruising after surgical procedures.   Please advise us if you are taking any "blood thinner" medications such as Coumadin or Dipyridamole or Plavix or similar medications. These cause increased bleeding and prolonged bleeding during procedures and bruising after surgical procedures. We may have to consider discontinuing these medications briefly prior to and shortly after your surgery if safe to do so.   Please inform us of all medications you are currently taking. All medications that are taken regularly should be taken the day of surgery as you always do. Nevertheless, we need to be informed of what medications you are taking prior to surgery to know whether they will affect the  procedure or cause any complications.   Please inform us of any medication allergies. Also inform us of whether you have allergies to Latex or rubber products or whether you have had any adverse reaction to Lidocaine or Epinephrine.  Please inform us of any prosthetic or artificial body parts such as artificial heart valve, joint replacements, etc., or similar condition that might require preoperative antibiotics.   We recommend avoidance of alcohol at least two weeks prior to surgery and continued avoidance for at least two weeks after surgery.   We recommend discontinuation of tobacco smoking at least two weeks prior to surgery and continued abstinence for at least two weeks after surgery.  Do not plan strenuous exercise, strenuous work or strenuous lifting for approximately four weeks after your surgery.   We request if you are unable to make your scheduled surgical appointment, please call us at least a week in advance or as soon as you are aware of a problem so that we can cancel or reschedule the appointment.   You MAY TAKE TYLENOL (acetaminophen) for pain as it is not a blood thinner.   PLEASE PLAN TO BE IN TOWN FOR TWO WEEKS FOLLOWING SURGERY, THIS IS IMPORTANT SO YOU CAN BE CHECKED FOR DRESSING CHANGES, SUTURE REMOVAL AND TO MONITOR FOR POSSIBLE COMPLICATIONS.    Due to recent changes in healthcare laws, you may see results of your pathology and/or laboratory studies on MyChart before the doctors have had a chance to review them. We understand that in some cases there may be results that are confusing or concerning to you. Please understand that not all results are   received at the same time and often the doctors may need to interpret multiple results in order to provide you with the best plan of care or course of treatment. Therefore, we ask that you please give us 2 business days to thoroughly review all your results before contacting the office for clarification. Should we see a  critical lab result, you will be contacted sooner.   If You Need Anything After Your Visit  If you have any questions or concerns for your doctor, please call our main line at [phone] and press option 4 to reach your doctor's medical assistant. If no one answers, please leave a voicemail as directed and we will return your call as soon as possible. Messages left after 4 pm will be answered the following business day.   You may also send us a message via MyChart. We typically respond to MyChart messages within 1-2 business days.  For prescription refills, please ask your pharmacy to contact our office. Our fax number is [phone].  If you have an urgent issue when the clinic is closed that cannot wait until the next business day, you can page your doctor at the number below.    Please note that while we do our best to be available for urgent issues outside of office hours, we are not available 24/7.   If you have an urgent issue and are unable to reach us, you may choose to seek medical care at your doctor's office, retail clinic, urgent care center, or emergency room.  If you have a medical emergency, please immediately call 911 or go to the emergency department.  Pager Numbers  - Dr. Kowalski: [phone]  - Dr. Moye: [phone]  - Dr. Stewart: [phone]  In the event of inclement weather, please call our main line at [phone] for an update on the status of any delays or closures.  Dermatology Medication Tips: Please keep the boxes that topical medications come in in order to help keep track of the instructions about where and how to use these. Pharmacies typically print the medication instructions only on the boxes and not directly on the medication tubes.   If your medication is too expensive, please contact our office at [phone] option 4 or send us a message through MyChart.   We are unable to tell what your co-pay for medications will be in advance as  this is different depending on your insurance coverage. However, we may be able to find a substitute medication at lower cost or fill out paperwork to get insurance to cover a needed medication.   If a prior authorization is required to get your medication covered by your insurance company, please allow us 1-2 business days to complete this process.  Drug prices often vary depending on where the prescription is filled and some pharmacies may offer cheaper prices.  The website www.goodrx.com contains coupons for medications through different pharmacies. The prices here do not account for what the cost may be with help from insurance (it may be cheaper with your insurance), but the website can give you the price if you did not use any insurance.  - You can print the associated coupon and take it with your prescription to the pharmacy.  - You may also stop by our office during regular business hours and pick up a GoodRx coupon card.  - If you need your prescription sent electronically to a different pharmacy, notify our office through [facility] MyChart or by phone at [phone] option 4.       Si Usted Necesita Algo Despus de Su Visita  Tambin puede enviarnos un mensaje a travs de MyChart. Por lo general respondemos a los mensajes de MyChart en el transcurso de 1 a 2 das hbiles.  Para renovar recetas, por favor pida a su farmacia que se ponga en contacto con nuestra oficina. Nuestro nmero de fax es el [phone].  Si tiene un asunto urgente cuando la clnica est cerrada y que no puede esperar hasta el siguiente da hbil, puede llamar/localizar a su doctor(a) al nmero que aparece a continuacin.   Por favor, tenga en cuenta que aunque hacemos todo lo posible para estar disponibles para asuntos urgentes fuera del horario de oficina, no estamos disponibles las 24 horas del da, los 7 das de la semana.   Si tiene un problema urgente y no puede comunicarse con nosotros, puede optar por  buscar atencin mdica  en el consultorio de su doctor(a), en una clnica privada, en un centro de atencin urgente o en una sala de emergencias.  Si tiene una emergencia mdica, por favor llame inmediatamente al 911 o vaya a la sala de emergencias.  Nmeros de bper  - Dr. Kowalski: [phone]  - Dra. Moye: [phone]  - Dra. Stewart: [phone]  En caso de inclemencias del tiempo, por favor llame a nuestra lnea principal al [phone] para una actualizacin sobre el estado de cualquier retraso o cierre.  Consejos para la medicacin en dermatologa: Por favor, guarde las cajas en las que vienen los medicamentos de uso tpico para ayudarle a seguir las instrucciones sobre dnde y cmo usarlos. Las farmacias generalmente imprimen las instrucciones del medicamento slo en las cajas y no directamente en los tubos del medicamento.   Si su medicamento es muy caro, por favor, pngase en contacto con nuestra oficina llamando al [phone] y presione la opcin 4 o envenos un mensaje a travs de MyChart.   No podemos decirle cul ser su copago por los medicamentos por adelantado ya que esto es diferente dependiendo de la cobertura de su seguro. Sin embargo, es posible que podamos encontrar un medicamento sustituto a menor costo o llenar un formulario para que el seguro cubra el medicamento que se considera necesario.   Si se requiere una autorizacin previa para que su compaa de seguros cubra su medicamento, por favor permtanos de 1 a 2 das hbiles para completar este proceso.  Los precios de los medicamentos varan con frecuencia dependiendo del lugar de dnde se surte la receta y alguna farmacias pueden ofrecer precios ms baratos.  El sitio web www.goodrx.com tiene cupones para medicamentos de diferentes farmacias. Los precios aqu no tienen en cuenta lo que podra costar con la ayuda del seguro (puede ser ms barato con su seguro), pero el sitio web puede darle el precio si no  utiliz ningn seguro.  - Puede imprimir el cupn correspondiente y llevarlo con su receta a la farmacia.  - Tambin puede pasar por nuestra oficina durante el horario de atencin regular y recoger una tarjeta de cupones de GoodRx.  - Si necesita que su receta se enve electrnicamente a una farmacia diferente, informe a nuestra oficina a travs de MyChart de [place] o por telfono llamando al [phone] y presione la opcin 4.  

## 2022-06-28 ENCOUNTER — Other Ambulatory Visit: Payer: Self-pay

## 2022-07-03 ENCOUNTER — Encounter: Payer: Self-pay | Admitting: Dermatology

## 2022-07-06 ENCOUNTER — Other Ambulatory Visit: Payer: Self-pay

## 2022-07-06 ENCOUNTER — Other Ambulatory Visit (HOSPITAL_COMMUNITY): Payer: Self-pay

## 2022-07-06 NOTE — Progress Notes (Signed)
Office Visit Note  Patient: [name]             Date of Birth: Aug 19, 1966           MRN: [id number]             PCP: McLean-Scocuzza, Nino Glow, MD Referring: Orland Mustard * Visit Date: 07/19/2022 Occupation: '@GUAROCC'$ @  Subjective:  Medication management  History of Present Illness: [name] is a 56 y.o. female history of rheumatoid arthritis, osteoarthritis and degenerative disc disease.  She states she has been under a lot of stress at work recently.  She has been having increased pain in her both hands, right hip and right knee.  She has not noticed swelling.  She has been taking Rinvoq 1 tablet daily and Arava 20 mg p.o. daily without any interruption.  She denies any side effects from the medications.  She continues to have neck and lower back pain.  Activities of Daily Living:  Patient reports morning stiffness for 2-3 hours.   Patient Reports nocturnal pain.  Difficulty dressing/grooming: Denies Difficulty climbing stairs: Reports Difficulty getting out of chair: Reports Difficulty using hands for taps, buttons, cutlery, and/or writing: Reports  Review of Systems  Constitutional:  Positive for fatigue.  HENT:  Positive for mouth sores. Negative for mouth dryness.   Eyes:  Positive for dryness.  Respiratory:  Negative for shortness of breath.   Cardiovascular:  Negative for chest pain and palpitations.  Gastrointestinal:  Positive for constipation. Negative for blood in stool and diarrhea.  Endocrine: Negative for increased urination.  Genitourinary:  Negative for involuntary urination.  Musculoskeletal:  Positive for joint pain, joint pain, joint swelling, myalgias, morning stiffness, muscle tenderness and myalgias. Negative for muscle weakness.  Skin:  Negative for color change, rash, hair loss and sensitivity to sunlight.  Allergic/Immunologic: Negative for susceptible to infections.  Neurological:  Positive for headaches. Negative for dizziness.   Hematological:  Negative for swollen glands.  Psychiatric/Behavioral:  Positive for sleep disturbance. Negative for depressed mood. The patient is not nervous/anxious.     PMFS History:  Patient Active Problem List   Diagnosis Date Noted   Premature atrial contractions 05/15/2022   PVC's (premature ventricular contractions) 05/15/2022   Iron deficiency anemia 01/16/2022   Right ear pain 01/09/2022   Sore throat 01/09/2022   OSA on CPAP 08/02/2021   Abnormal MRI, lumbar spine 03/20/2021   Abnormal MRI, thoracic spine 03/20/2021   Chronic mid back pain 03/17/2021   Chronic midline low back pain with left-sided sciatica 03/17/2021   Type 1 diabetes mellitus with diabetic polyneuropathy ([place]) 02/17/2021   Severe obstructive sleep apnea-hypopnea syndrome 02/17/2021   Chronic diastolic CHF (congestive heart failure) ([place]) 12/19/2020   Chronic back pain 12/19/2020   Chronic abdominal pain 73/71/0626   Acute diastolic heart failure ([place]) 12/09/2020   Diabetic retinopathy associated with type 1 diabetes mellitus ([place]) 09/27/2020   Diabetic retinopathy ([place]) 09/27/2020   Graves' disease 09/27/2020   Chronic coronary artery disease 09/27/2020   Hypertension associated with diabetes ([place]) 09/14/2020   Obesity (BMI 30-39.9) 09/14/2020   Annual physical exam 09/14/2020   Aortic atherosclerosis ([place]) 09/14/2020   Snoring 05/12/2020   Lung nodule 03/10/2020   Colitis 03/10/2020   Type 1 diabetes mellitus with proliferative retinopathy ([place]) 12/10/2019   Abnormal thyroid function test 12/10/2019   Bruxism (teeth grinding)    Chronic migraine 10/07/2019   Contusion of left hip 07/25/2019   H/O multiple allergies 03/10/2019  Hx of anaphylaxis 03/10/2019   Cough 06/26/2018   PND (post-nasal drip) 06/26/2018   Lumbar strain, initial encounter 04/10/2018   Thoracic myofascial strain 04/10/2018   Vitamin D deficiency 07/04/2017   B12 deficiency 07/04/2017   Abnormal CT scan 06/19/2017    Gastroesophageal reflux disease 06/19/2017   Antiplatelet or antithrombotic long-term use 06/19/2017   Sinus congestion 03/15/2017   Graves disease 02/01/2017   Sleep difficulties 02/01/2017   No energy 02/01/2017   Osteopenia 02/01/2017   Bilateral hand pain 07/16/2016   Acquired trigger finger 07/16/2016   Hypertension, essential 05/08/2016   Foot pain, right 08/30/2014   Shoulder pain, right 08/30/2014   Chronic pain syndrome 01/15/2014   Multiple joint pain 01/15/2014   Lesion of lower eyelid 04/21/2013   Generalized constriction of visual field 03/31/2013   Neoplasm of uncertain behavior of skin of eyelid 03/31/2013   Posterior capsular opacification 03/31/2013   Cyst of left lower eyelid 03/30/2013   Pseudophakia of both eyes 03/30/2013   Prurigo nodularis 02/05/2013   Stasis dermatitis 02/05/2013   Psoriasis 01/21/2013   Trochanteric bursitis 11/10/2012   Achilles tendinitis 07/11/2012   Epiretinal membrane 06/17/2012   Status post cataract extraction 04/30/2012   Pes equinus, acquired 04/23/2012   Tendinitis of ankle 04/23/2012   Proliferative diabetic retinopathy of both eyes ([place]) 01/11/2012   Ongoing use of possibly toxic medication 12/05/2011   Hyperthyroidism 11/09/2010   SINUS TACHYCARDIA 11/08/2010   DERMATITIS, ALLERGIC 07/20/2010   EDEMA 07/12/2010   Dizziness 11/14/2009   ATRIAL FIBRILLATION 07/20/2009   Hx of CABG 06/07/2009   ANGINA, STABLE/EXERTIONAL 06/02/2009   Dyslipidemia, goal LDL below 70 04/26/2009   ACUT MI ANTEROLAT WALL SUBSQT EPIS CARE 04/26/2009   Chest pain 04/26/2009   DIABETIC  RETINOPATHY 04/25/2009   CARPAL TUNNEL SYNDROME, BILATERAL 04/25/2009   TRIGGER FINGER 04/25/2009   MIGRAINE W/O AURA W/INTRACT W/STATUS MIGRAINOSUS 02/19/2008   ALLERGIC RHINITIS, SEASONAL 10/16/2006   CHOLELITHIASIS 10/16/2006   Rheumatoid arthritis ([place]) 10/16/2006    Past Medical History:  Diagnosis Date   ACUT MI ANTEROLAT WALL SUBSQT EPIS CARE     Acute maxillary sinusitis    ALLERGIC RHINITIS, SEASONAL    Anemia    ARTHRITIS, RHEUMATOID    shoulders and hands Enbrel>leg swelling Dr. Estanislado Pandy   Atrial fibrillation [facility])    a. after CABG.   Back pain    Bruxism (teeth grinding)    CAD, ARTERY BYPASS GRAFT    a. DES to RCA in 2010 then LAD occlusion s/p CABG 3 06/07/2009 with LIMA to LAD, reverse SVG to D1, reverse SVG to distal RCA. b. Cath 05/08/2016 slightly hypodense region in the intermediate branch, however she had excellent flow, FFR was normal. Vein graft to PDA and the posterolateral branch is patent, patent LIMA to LAD, occluded SVG to diagonal.   CARPAL TUNNEL SYNDROME, BILATERAL    CHOLELITHIASIS    Contrast media allergy    DERMATITIS, ALLERGIC    DIABETES MELLITUS, TYPE I    on insulin pump dx'ed age 81 y.o    DIABETIC  RETINOPATHY    Hiatal hernia    HYPERLIPIDEMIA-MIXED    HYPERTHYROIDISM    Dr. Dollene [place]   IDA (iron deficiency anemia)    Insulin pump in place    Lymphadenopathy of head and neck    MIGRAINE W/O AURA W/INTRACT W/STATUS MIGRAINOSUS 02/19/2008   PONV (postoperative nausea and vomiting)    Psoriasis    SINUS TACHYCARDIA 11/08/2010   Sleep apnea  not using machine yet    SVT (supraventricular tachycardia) ([facility])    after s/p CABG   TRIGGER FINGER    all fingers b/l hands    URI     Family History  Problem Relation Age of Onset   Depression Mother    Anxiety disorder Mother    Colon polyps Father    Diabetes Father        2   Hypertension Father    Parkinson's disease Father    Stroke Paternal Grandmother    Heart disease Other    Hypertension Other    Hyperlipidemia Other    Depression Other    Migraines Other    Heart attack Neg Hx    Colon cancer Neg Hx    Stomach cancer Neg Hx    Esophageal cancer Neg Hx    Past Surgical History:  Procedure Laterality Date   ABDOMINAL HYSTERECTOMY     endometriomas b/l; total ? cervix removed; no h/o abnormal pap   Caesarean section      CARDIAC CATHETERIZATION N/A 05/08/2016   Procedure: Left Heart Cath and Cors/Grafts Angiography;  Surgeon: Burnell Blanks, MD;  Location: [facility];  Service: Cardiovascular;  Laterality: N/A;   CARPAL TUNNEL RELEASE     b/l    CARPAL TUNNEL RELEASE Left 06/06/2021   Procedure: CARPAL TUNNEL RELEASE;  Surgeon: Daryll Brod, MD;  Location: [facility];  Service: Orthopedics;  Laterality: Left;  AXILLARY BLOCK   CATARACT EXTRACTION, BILATERAL     CHOLECYSTECTOMY     COLONOSCOPY WITH PROPOFOL N/A 03/25/2020   Procedure: COLONOSCOPY WITH PROPOFOL;  Surgeon: Jonathon Bellows, MD;  Location: [facility] ENDOSCOPY;  Service: Gastroenterology;  Laterality: N/A;   CORONARY ARTERY BYPASS GRAFT     ESOPHAGOGASTRODUODENOSCOPY (EGD) WITH PROPOFOL N/A 03/25/2020   Procedure: ESOPHAGOGASTRODUODENOSCOPY (EGD) WITH PROPOFOL;  Surgeon: Jonathon Bellows, MD;  Location: [facility] ENDOSCOPY;  Service: Gastroenterology;  Laterality: N/A;   EYE SURGERY     laser x 2 for retinopathy    LEFT HEART CATH AND CORS/GRAFTS ANGIOGRAPHY N/A 02/22/2020   Procedure: LEFT HEART CATH AND CORS/GRAFTS ANGIOGRAPHY;  Surgeon: Burnell Blanks, MD;  Location: [facility];  Service: Cardiovascular;  Laterality: N/A;   TRIGGER FINGER RELEASE     b/l fingers all    ULNAR NERVE TRANSPOSITION Left 06/06/2021   Procedure: ULNAR NERVE DECOMPRESSION;  Surgeon: Daryll Brod, MD;  Location: [place];  Service: Orthopedics;  Laterality: Left;  AXILLARY BLOCK   VITRECTOMY     b/l    Social History   Social History Narrative   Divorced. Has 2 kids(fraternal twins) daughters age 15. Works at [employer], Never smoked, denies ETOH, no drugs. Drinks diet coke. No exercise.       DPR daughters [name] and Alishah Schulte twins          Immunization History  Administered Date(s) Administered   Influenza Inj Mdck Quad With Preservative 09/30/2014   Influenza, Quadrivalent, Recombinant, Inj, Pf  10/14/2013   Influenza, Seasonal, Injecte, Preservative Fre 09/19/2016   Influenza,inj,Quad PF,6+ Mos 09/30/2014, 09/23/2017, 09/03/2018   Influenza,inj,quad, With Preservative 09/23/2017   Influenza-Unspecified 09/30/2014, 09/30/2016, 09/03/2018, 09/10/2019, 09/28/2020, 09/28/2021   PFIZER(Purple Top)SARS-COV-2 Vaccination 12/28/2019, 01/18/2020   Pneumococcal Conjugate-13 07/16/2016   Pneumococcal Polysaccharide-23 01/01/2004, 10/15/2006, 01/04/2020   Td 10/01/2003   Tdap 01/09/2011, 06/25/2018   Zoster Recombinat (Shingrix) 09/14/2020, 12/15/2020     Objective: Vital Signs: BP 128/76 (BP Location: Left Arm, Patient  Position: Sitting, Cuff Size: Normal)   Pulse 80   Ht '5\' 3"'$  (1.6 m)   Wt 171 lb (77.6 kg)   BMI 30.29 kg/m    Physical Exam Vitals and nursing note reviewed.  Constitutional:      Appearance: She is well-developed.  HENT:     Head: Normocephalic and atraumatic.  Eyes:     Conjunctiva/sclera: Conjunctivae normal.  Cardiovascular:     Rate and Rhythm: Normal rate and regular rhythm.     Heart sounds: Normal heart sounds.  Pulmonary:     Effort: Pulmonary effort is normal.     Breath sounds: Normal breath sounds.  Abdominal:     General: Bowel sounds are normal.     Palpations: Abdomen is soft.  Musculoskeletal:     Cervical back: Normal range of motion.  Lymphadenopathy:     Cervical: No cervical adenopathy.  Skin:    General: Skin is warm and dry.     Capillary Refill: Capillary refill takes less than 2 seconds.  Neurological:     Mental Status: She is alert and oriented to person, place, and time.  Psychiatric:        Behavior: Behavior normal.      Musculoskeletal Exam: She had limited lateral rotation of the cervical spine.  She had painful range of motion of lumbar spine.  She discomfort range of motion of her right shoulder joint.  She had contracture in her left elbow joint without synovitis.  Wrist joints and MCPs were in good range of motion  with no synovitis.  She had bilateral PIP and DIP thickening.  Hip joints with good range of motion.  She had warmth on palpation of her right knee joint with discomfort.  Left knee joint was in good range of motion.  There was no tenderness over ankles or MTPs.  CDAI Exam: CDAI Score: 1.8  Patient Global: 5 mm; Provider Global: 3 mm Swollen: 0 ; Tender: 1  Joint Exam 07/19/2022      Right  Left  Knee   Tender        Investigation: No additional findings.  Imaging: No results found.  Recent Labs: Lab Results  Component Value Date   WBC 5.0 05/07/2022   HGB 12.8 05/07/2022   PLT 188 05/07/2022   NA 137 05/15/2022   K 4.0 05/15/2022   CL 100 05/15/2022   CO2 30 05/15/2022   GLUCOSE 125 (H) 05/15/2022   BUN 16 05/15/2022   CREATININE 0.97 05/15/2022   BILITOT 0.3 05/15/2022   ALKPHOS 63 05/15/2022   AST 22 05/15/2022   ALT 19 05/15/2022   PROT 6.8 05/15/2022   ALBUMIN 4.5 05/15/2022   CALCIUM 8.9 05/15/2022   GFRAA 86 04/07/2021   QFTBGOLDPLUS NEGATIVE 10/31/2021    Speciality Comments: PLQ Eye Exam: 01/06/2020 ABNORMAL @ [facility]. Patient advised to discontinue PLQ at office visit on 01/06/2020.  Prior Therapy: methotrexate/Xeljanz/Actemra (GI upset), Enbrel (injection site reaction) and Orencia/Remicade/Humira/Cimzia/Rituxan/Simponi/Simponi Aria (inadequate response).  Procedures:  Large Joint Inj: R knee on 07/19/2022 3:26 PM Indications: pain Details: 27 G 1.5 in needle, medial approach  Arthrogram: No  Medications: 40 mg triamcinolone acetonide 40 MG/ML; 1.5 mL lidocaine 1 % Aspirate: 0 mL Outcome: tolerated well, no immediate complications Procedure, treatment alternatives, risks and benefits explained, specific risks discussed. Consent was given by the patient. Immediately prior to procedure a time out was called to verify the correct patient, procedure, equipment, support staff and site/side  marked as required. Patient was prepped  and draped in the usual sterile fashion.     Allergies: Actemra [tocilizumab], Ramipril, Shellfish-derived products, Atorvastatin, Compazine  [prochlorperazine edisylate], Emgality [galcanezumab-gnlm], Etanercept, Infliximab, Iohexol, Orencia [abatacept], Prochlorperazine edisylate, Tofacitinib, Trokendi xr [topiramate er], Tramadol, Amiodarone, and Rituximab   Assessment / Plan:     Visit Diagnoses: Rheumatoid arthritis of multiple sites with negative rheumatoid factor ([place]) - Methotrexate/Xeljanz/Actemra (GI upset), Enbrel (injection site reaction) and Orencia/Remicade/Humira/Cimzia/Rituxan/Simponi/Simponi Aria (inadequate response): She has been experiencing increased pain and discomfort in multiple joints.  She states that her workload has increased.  She has been under a lot of stress.  She has been having discomfort in her right shoulder, bilateral hands, right hip and right knee joint.  She is going to [place] coming weekend and would like to have right knee injected.  She continues to have neck and lower back pain.  She states on the current medications her blood glucose is very well controlled.  High risk medication use - Rinvoq 15 mg 1 tablet by mouth once daily and Arava 20 mg 1 tablet by mouth daily.  Labs from May 2023 were reviewed which were within normal limits.  She would like to have labs today as she will be out of town.  We will get CBC with differential and CMP with GFR today.  TB gold will be due in November 2023.  Increased risk of blood clots, major cardiovascular events and malignancy with Rinvoq use was discussed.  She is advised to stop Rinvoq and leflunomide if she develops an infection and resume after the infection resolves.  Information on immunization was placed in the AVS.  Psoriasis-she had no active lesions.  Contracture of joint of both elbows-unchanged with no synovitis.  Trochanteric bursitis, right hip -she has recurrence of trochanteric bursitis.  IT band  stretches were discussed.  Chronic pain of right knee-she has been experiencing increased pain and discomfort in her right knee joint.  Warmth was noted on palpation.  I will obtain x-ray of the right knee joint.  Per patient's request after informed consent was obtained right knee joint was injected with 1-1/2 mL of 1% lidocaine and Kenalog.  Patient states that her blood sugar has been well controlled.  She will continue to monitor blood glucose level.  DDD (degenerative disc disease), thoracic-chronic pain.  DDD (degenerative disc disease), lumbar - She had a MRI of the lumbar spine on 04/02/21: Mild canal stenosis at L3-L4 and L4-L5, slightly progressed.  She continues to have lower back pain.  Piriformis syndrome of right side-improved.  Osteopenia of multiple sites - Most recent DEXA on 11/11/19: The BMD measured at Femur Neck Left is 0.901 g/cm2 with a T-score of -1.0-Normal.   Other medical problems are listed as follows:  Other fatigue  Other insomnia  History of vitamin D deficiency  History of hypertension-blood pressure was normal today.  History of hyperlipidemia  History of coronary artery disease  History of diabetes mellitus  History of gastroesophageal reflux (GERD)  History of Graves' disease  History of migraine  Abnormal SPEP - she had an abnormal SPEP in 2020.   Orders: Orders Placed This Encounter  Procedures   Large Joint Inj   XR KNEE 3 VIEW RIGHT   CBC with Differential/Platelet   COMPLETE METABOLIC PANEL WITH GFR   No orders of the defined types were placed in this encounter.   Follow-Up Instructions: Return in about 5 months (around 12/19/2022) for Rheumatoid arthritis, Osteoarthritis.  Bo Merino, MD  Note - This record has been created using Editor, commissioning.  Chart creation errors have been sought, but may not always  have been located. Such creation errors do not reflect on  the standard of medical care.

## 2022-07-09 MED FILL — Losartan Potassium Tab 100 MG: ORAL | 90 days supply | Qty: 90 | Fill #1 | Status: AC

## 2022-07-09 MED FILL — Cyclobenzaprine HCl Tab 10 MG: ORAL | 30 days supply | Qty: 30 | Fill #2 | Status: AC

## 2022-07-10 ENCOUNTER — Other Ambulatory Visit: Payer: Self-pay

## 2022-07-10 MED ORDER — INSULIN DETEMIR 100 UNIT/ML ~~LOC~~ SOLN
SUBCUTANEOUS | 0 refills | Status: DC
  Filled 2022-07-10: qty 10, 27d supply, fill #0

## 2022-07-11 ENCOUNTER — Other Ambulatory Visit: Payer: Self-pay

## 2022-07-11 ENCOUNTER — Other Ambulatory Visit: Payer: Self-pay | Admitting: Cardiovascular Disease

## 2022-07-12 ENCOUNTER — Other Ambulatory Visit (HOSPITAL_COMMUNITY): Payer: Self-pay

## 2022-07-12 MED FILL — Clopidogrel Bisulfate Tab 75 MG (Base Equiv): ORAL | 90 days supply | Qty: 90 | Fill #0 | Status: CN

## 2022-07-12 NOTE — Telephone Encounter (Signed)
Pt's pharmacy is requesting a refill on clopidogrel. This medication is no longer on pt's medication list. Does pt still supposed to be taking this medication? Please address

## 2022-07-12 NOTE — Telephone Encounter (Signed)
Per OV note on 03/01/22: In type I diabetic with premature coronary artery disease.  She remains on dual antiplatelet therapy with aspirin and clopidogrel.  She is treated with a beta-blocker and long-acting nitrate.  She currently is doing very well with no anginal chest pain.  Most recent cardiac catheterization reviewed as outlined above.  Prior prescription expired in march of this year. Will send refill in now.

## 2022-07-13 ENCOUNTER — Encounter (HOSPITAL_COMMUNITY): Payer: Self-pay

## 2022-07-13 ENCOUNTER — Other Ambulatory Visit: Payer: Self-pay

## 2022-07-13 ENCOUNTER — Other Ambulatory Visit (HOSPITAL_COMMUNITY): Payer: Self-pay

## 2022-07-16 DIAGNOSIS — Z794 Long term (current) use of insulin: Secondary | ICD-10-CM | POA: Diagnosis not present

## 2022-07-16 DIAGNOSIS — E10319 Type 1 diabetes mellitus with unspecified diabetic retinopathy without macular edema: Secondary | ICD-10-CM | POA: Diagnosis not present

## 2022-07-19 ENCOUNTER — Ambulatory Visit: Payer: 59 | Admitting: Rheumatology

## 2022-07-19 ENCOUNTER — Other Ambulatory Visit: Payer: Self-pay

## 2022-07-19 ENCOUNTER — Ambulatory Visit (INDEPENDENT_AMBULATORY_CARE_PROVIDER_SITE_OTHER): Payer: 59

## 2022-07-19 ENCOUNTER — Encounter: Payer: Self-pay | Admitting: Rheumatology

## 2022-07-19 VITALS — BP 128/76 | HR 80 | Ht 63.0 in | Wt 171.0 lb

## 2022-07-19 DIAGNOSIS — M25561 Pain in right knee: Secondary | ICD-10-CM | POA: Diagnosis not present

## 2022-07-19 DIAGNOSIS — G4709 Other insomnia: Secondary | ICD-10-CM

## 2022-07-19 DIAGNOSIS — Z79899 Other long term (current) drug therapy: Secondary | ICD-10-CM | POA: Diagnosis not present

## 2022-07-19 DIAGNOSIS — G8929 Other chronic pain: Secondary | ICD-10-CM

## 2022-07-19 DIAGNOSIS — Z8669 Personal history of other diseases of the nervous system and sense organs: Secondary | ICD-10-CM

## 2022-07-19 DIAGNOSIS — M24521 Contracture, right elbow: Secondary | ICD-10-CM | POA: Diagnosis not present

## 2022-07-19 DIAGNOSIS — Z8639 Personal history of other endocrine, nutritional and metabolic disease: Secondary | ICD-10-CM

## 2022-07-19 DIAGNOSIS — M5134 Other intervertebral disc degeneration, thoracic region: Secondary | ICD-10-CM | POA: Diagnosis not present

## 2022-07-19 DIAGNOSIS — L409 Psoriasis, unspecified: Secondary | ICD-10-CM

## 2022-07-19 DIAGNOSIS — M0609 Rheumatoid arthritis without rheumatoid factor, multiple sites: Secondary | ICD-10-CM

## 2022-07-19 DIAGNOSIS — M8589 Other specified disorders of bone density and structure, multiple sites: Secondary | ICD-10-CM | POA: Diagnosis not present

## 2022-07-19 DIAGNOSIS — R5383 Other fatigue: Secondary | ICD-10-CM | POA: Diagnosis not present

## 2022-07-19 DIAGNOSIS — M7061 Trochanteric bursitis, right hip: Secondary | ICD-10-CM

## 2022-07-19 DIAGNOSIS — M5136 Other intervertebral disc degeneration, lumbar region: Secondary | ICD-10-CM

## 2022-07-19 DIAGNOSIS — R778 Other specified abnormalities of plasma proteins: Secondary | ICD-10-CM

## 2022-07-19 DIAGNOSIS — M24522 Contracture, left elbow: Secondary | ICD-10-CM

## 2022-07-19 DIAGNOSIS — Z8719 Personal history of other diseases of the digestive system: Secondary | ICD-10-CM

## 2022-07-19 DIAGNOSIS — Z8679 Personal history of other diseases of the circulatory system: Secondary | ICD-10-CM

## 2022-07-19 DIAGNOSIS — G5701 Lesion of sciatic nerve, right lower limb: Secondary | ICD-10-CM

## 2022-07-19 MED ORDER — TRIAMCINOLONE ACETONIDE 40 MG/ML IJ SUSP
40.0000 mg | INTRAMUSCULAR | Status: AC | PRN
  Administered 2022-07-19: 40 mg via INTRA_ARTICULAR

## 2022-07-19 MED ORDER — LIDOCAINE HCL 1 % IJ SOLN
1.5000 mL | INTRAMUSCULAR | Status: AC | PRN
  Administered 2022-07-19: 1.5 mL

## 2022-07-19 NOTE — Patient Instructions (Addendum)
Standing Labs We placed an order today for your standing lab work.   Please have your standing labs drawn in October and every 3 months  If possible, please have your labs drawn 2 weeks prior to your appointment so that the provider can discuss your results at your appointment.  Please note that you may see your imaging and lab results in MyChart before we have reviewed them. We may be awaiting multiple results to interpret others before contacting you. Please allow our office up to 72 hours to thoroughly review all of the results before contacting the office for clarification of your results.  We have open lab daily: Monday through Thursday from 1:30-4:30 PM and Friday from 1:30-4:00 PM at the office of Dr. Stephania Macfarlane, [facility].   Please be advised, all patients with office appointments requiring lab work will take precedent over walk-in lab work.  If possible, please come for your lab work on Monday and Friday afternoons, as you may experience shorter wait times. The office is located at [address], [address], [place],  [address] No appointment is necessary.   Labs are drawn by [facility]. Please bring your co-pay at the time of your lab draw.  You may receive a bill from [facility] for your lab work.  Please note if you are on Hydroxychloroquine and and an order has been placed for a Hydroxychloroquine level, you will need to have it drawn 4 hours or more after your last dose.  If you wish to have your labs drawn at another location, please call the office 24 hours in advance to send orders.  If you have any questions regarding directions or hours of operation,  please call [phone].   As a reminder, please drink plenty of water prior to coming for your lab work. Thanks!   Vaccines You are taking a medication(s) that can suppress your immune system.  The following immunizations are recommended: Flu annually Covid-19  Td/Tdap (tetanus, diphtheria,  pertussis) every 10 years Pneumonia (Prevnar 15 then Pneumovax 23 at least 1 year apart.  Alternatively, can take Prevnar 20 without needing additional dose) Shingrix: 2 doses from 4 weeks to 6 months apart  Please check with your PCP to make sure you are up to date.   If you have signs or symptoms of an infection or start antibiotics: First, call your PCP for workup of your infection. Hold your medication through the infection, until you complete your antibiotics, and until symptoms resolve if you take the following: Injectable medication (Actemra, Benlysta, Cimzia, Cosentyx, Enbrel, Humira, Kevzara, Orencia, Remicade, Simponi, Stelara, Taltz, Tremfya) Methotrexate Leflunomide (Arava) Mycophenolate (Cellcept) Xeljanz, Olumiant, or Rinvoq  

## 2022-07-20 ENCOUNTER — Other Ambulatory Visit (HOSPITAL_COMMUNITY): Payer: Self-pay

## 2022-07-20 ENCOUNTER — Other Ambulatory Visit: Payer: Self-pay

## 2022-07-20 LAB — COMPLETE METABOLIC PANEL WITH GFR
AG Ratio: 2.2 (calc) (ref 1.0–2.5)
ALT: 18 U/L (ref 6–29)
AST: 23 U/L (ref 10–35)
Albumin: 4.3 g/dL (ref 3.6–5.1)
Alkaline phosphatase (APISO): 64 U/L (ref 37–153)
BUN: 11 mg/dL (ref 7–25)
CO2: 30 mmol/L (ref 20–32)
Calcium: 9.1 mg/dL (ref 8.6–10.4)
Chloride: 100 mmol/L (ref 98–110)
Creat: 0.86 mg/dL (ref 0.50–1.03)
Globulin: 2 g/dL (calc) (ref 1.9–3.7)
Glucose, Bld: 143 mg/dL — ABNORMAL HIGH (ref 65–99)
Potassium: 3.5 mmol/L (ref 3.5–5.3)
Sodium: 140 mmol/L (ref 135–146)
Total Bilirubin: 0.3 mg/dL (ref 0.2–1.2)
Total Protein: 6.3 g/dL (ref 6.1–8.1)
eGFR: 80 mL/min/{1.73_m2} (ref >=60)

## 2022-07-20 LAB — CBC WITH DIFFERENTIAL/PLATELET
Absolute Monocytes: 364 cells/uL (ref 200–950)
Basophils Absolute: 22 cells/uL (ref 0–200)
Basophils Relative: 0.6 %
Eosinophils Absolute: 72 cells/uL (ref 15–500)
Eosinophils Relative: 2 %
HCT: 34 % — ABNORMAL LOW (ref 35.0–45.0)
Hemoglobin: 11.6 g/dL — ABNORMAL LOW (ref 11.7–15.5)
Lymphs Abs: 1627 cells/uL (ref 850–3900)
MCH: 29.7 pg (ref 27.0–33.0)
MCHC: 34.1 g/dL (ref 32.0–36.0)
MCV: 87 fL (ref 80.0–100.0)
MPV: 9.7 fL (ref 7.5–12.5)
Monocytes Relative: 10.1 %
Neutro Abs: 1516 cells/uL (ref 1500–7800)
Neutrophils Relative %: 42.1 %
Platelets: 220 10*3/uL (ref 140–400)
RBC: 3.91 10*6/uL (ref 3.80–5.10)
RDW: 12.9 % (ref 11.0–15.0)
Total Lymphocyte: 45.2 %
WBC: 3.6 10*3/uL — ABNORMAL LOW (ref 3.8–10.8)

## 2022-07-20 MED FILL — Clopidogrel Bisulfate Tab 75 MG (Base Equiv): ORAL | 30 days supply | Qty: 30 | Fill #0 | Status: AC

## 2022-07-20 NOTE — Progress Notes (Signed)
White cell count is low due to immune suppression.  Hemoglobin is low and stable.  Glucose is elevated.

## 2022-07-30 ENCOUNTER — Other Ambulatory Visit (HOSPITAL_COMMUNITY): Payer: Self-pay

## 2022-07-30 ENCOUNTER — Other Ambulatory Visit: Payer: Self-pay | Admitting: Pharmacist

## 2022-07-30 DIAGNOSIS — M0609 Rheumatoid arthritis without rheumatoid factor, multiple sites: Secondary | ICD-10-CM

## 2022-07-30 MED ORDER — RINVOQ 15 MG PO TB24
15.0000 mg | ORAL_TABLET | Freq: Every day | ORAL | 2 refills | Status: DC
  Filled 2022-07-30 – 2022-08-03 (×2): qty 30, 30d supply, fill #0
  Filled 2022-09-04: qty 30, 30d supply, fill #1
  Filled 2022-11-07: qty 30, 30d supply, fill #2

## 2022-07-30 NOTE — Telephone Encounter (Signed)
Rx for Rinvoq '15mg'$  daily every 7 days sent to [facility] for hospital-based cost pricing.   Total qty remaining is 22m    DKnox Saliva PharmD, MPH, BCPS, CPP Clinical Pharmacist (Rheumatology and Pulmonology)

## 2022-08-01 ENCOUNTER — Ambulatory Visit: Payer: 59 | Admitting: Dermatology

## 2022-08-01 ENCOUNTER — Other Ambulatory Visit (HOSPITAL_COMMUNITY): Payer: Self-pay

## 2022-08-01 DIAGNOSIS — L720 Epidermal cyst: Secondary | ICD-10-CM

## 2022-08-01 DIAGNOSIS — D492 Neoplasm of unspecified behavior of bone, soft tissue, and skin: Secondary | ICD-10-CM

## 2022-08-01 NOTE — Progress Notes (Unsigned)
   Follow-Up Visit   Subjective  [name] is a 56 y.o. female who presents for the following: Procedure (Patient here today for excision of cyst at right axilla. ).   The following portions of the chart were reviewed this encounter and updated as appropriate:   Tobacco  Allergies  Meds  Problems  Med Hx  Surg Hx  Fam Hx      Review of Systems:  No other skin or systemic complaints except as noted in HPI or Assessment and Plan.  Objective  Well appearing patient in no apparent distress; mood and affect are within normal limits.  A focused examination was performed including right thigh, right axilla. Relevant physical exam findings are noted in the Assessment and Plan.  right axilla Small subcutaneous papule 0.6cm  right posterior thigh 1.3 cm hyperpigmented sq nodule     Assessment & Plan  Neoplasm of skin (2) right axilla  Skin excision  Lesion length (cm):  0.8 Margin per side (cm):  0.1 Total excision diameter (cm):  1 Informed consent: discussed and consent obtained   Timeout: patient name, date of birth, surgical site, and procedure verified   Procedure prep:  Patient was prepped and draped in usual sterile fashion Prep type:  Chlorhexidine Anesthesia: the lesion was anesthetized in a standard fashion   Anesthetic:  1% lidocaine w/ epinephrine 1-100,000 buffered w/ 8.4% NaHCO3 (3 cc lido w/epi, 3 cc bupivicaine) Instrument used comment:  15c Hemostasis achieved with: pressure and electrodesiccation    Skin repair Complexity:  Intermediate Final length (cm):  1.9 Informed consent: discussed and consent obtained   Timeout: patient name, date of birth, surgical site, and procedure verified   Procedure prep:  Patient was prepped and draped in usual sterile fashion Prep type:  Chlorhexidine Anesthesia: the lesion was anesthetized in a standard fashion   Anesthetic:  1% lidocaine w/ epinephrine 1-100,000 local infiltration Reason for type of repair:  reduce tension to allow closure, reduce the risk of dehiscence, infection, and necrosis, preserve normal anatomy and preserve normal anatomical and functional relationships   Undermining: edges undermined   Subcutaneous layers (deep stitches):  Suture size:  4-0 Suture type: Vicryl (polyglactin 910)   Fine/surface layer approximation (top stitches):  Suture size:  4-0 Suture type: Prolene (polypropylene)   Suture removal (days):  8 Hemostasis achieved with: suture, pressure and electrodesiccation Outcome: patient tolerated procedure well with no complications   Post-procedure details: wound care instructions given   Additional details:  Mupirocin and a pressure dressing applied  Specimen 1 - Surgical pathology Differential Diagnosis: r/o Cyst vs Other  Check Margins: No Small subcutaneous papule 0.6cm  right posterior thigh  Favor cyst at right posterior thigh No suspicious features on dermoscopy, benign appearing, decreased in size   Return in about 8 days (around 08/09/2022) for Suture Removal.  Graciella Belton, RMA, am acting as scribe for Forest Gleason, MD .  Documentation: I have reviewed the above documentation for accuracy and completeness, and I agree with the above.  Forest Gleason, MD

## 2022-08-01 NOTE — Progress Notes (Deleted)
   Follow-Up Visit   Subjective  [name] is a 56 y.o. female who presents for the following: Procedure (Patient here today for excision of cyst at right axilla. ).  Patient also here for punch biopsy at right posterior thigh.  The following portions of the chart were reviewed this encounter and updated as appropriate:       Review of Systems:  No other skin or systemic complaints except as noted in HPI or Assessment and Plan.  Objective  Well appearing patient in no apparent distress; mood and affect are within normal limits.  A focused examination was performed including right thigh, right axilla. Relevant physical exam findings are noted in the Assessment and Plan.  right axilla Small subcutaneous papule 0.6cm  right posterior thigh 1.3 cm hyperpigmented sq nodule     Assessment & Plan  Neoplasm of skin (2) right axilla  Skin excision  Lesion length (cm):  0.6 Informed consent: discussed and consent obtained   Timeout: patient name, date of birth, surgical site, and procedure verified   Procedure prep:  Patient was prepped and draped in usual sterile fashion Prep type:  Chlorhexidine Anesthesia: the lesion was anesthetized in a standard fashion   Anesthetic:  1% lidocaine w/ epinephrine 1-100,000 buffered w/ 8.4% NaHCO3 (3 cc lido w/epi, 3 cc bupivicaine) Instrument used comment:  15c Hemostasis achieved with: pressure and electrodesiccation    Skin repair Complexity:  Intermediate Final length (cm):  1.9 Informed consent: discussed and consent obtained   Timeout: patient name, date of birth, surgical site, and procedure verified   Procedure prep:  Patient was prepped and draped in usual sterile fashion Prep type:  Chlorhexidine Anesthesia: the lesion was anesthetized in a standard fashion   Anesthetic:  1% lidocaine w/ epinephrine 1-100,000 local infiltration Reason for type of repair: reduce tension to allow closure, reduce the risk of dehiscence,  infection, and necrosis, preserve normal anatomy and preserve normal anatomical and functional relationships   Undermining: edges undermined   Subcutaneous layers (deep stitches):  Suture size:  4-0 Suture type: Vicryl (polyglactin 910)   Fine/surface layer approximation (top stitches):  Suture size:  4-0 Suture type: Prolene (polypropylene)   Suture removal (days):  8 Hemostasis achieved with: suture, pressure and electrodesiccation Outcome: patient tolerated procedure well with no complications   Post-procedure details: wound care instructions given   Additional details:  Mupirocin and a pressure dressing applied  Specimen 1 - Surgical pathology Differential Diagnosis: r/o Cyst vs Other  Check Margins: No Small subcutaneous papule 0.6cm  right posterior thigh  Favor cyst at right posterior thigh No suspicious features on dermoscopy, benign appearing, decreased in size   Return in about 8 days (around 08/09/2022) for Suture Removal.  Graciella Belton, RMA, am acting as scribe for Forest Gleason, MD .

## 2022-08-01 NOTE — Patient Instructions (Signed)
Wound Care Instructions for After Surgery  On the day following your surgery, you should begin doing daily dressing changes until your sutures are removed: Remove the bandage. Cleanse the wound gently with soap and water.  Make sure you then dry the skin surrounding the wound completely or the tape will not stick to the skin. Do not use cotton balls on the wound. After the wound is clean and dry, apply the ointment (either prescription antibiotic prescribed by your doctor or plain Vaseline if nothing was prescribed) gently with a Q-tip. If you are using a bandaid to cover: Apply a bandaid large enough to cover the entire wound. If you do not have a bandaid large enough to cover the wound OR if you are sensitive to bandaid adhesive: Cut a non-stick pad (such as Telfa) to fit the size of the wound.  Cover the wound with the non-stick pad. If the wound is draining, you may want to add a small amount of gauze on top of the non-stick pad for a little added compression to the area. Use tape to seal the area completely.  For the next 1-2 weeks: Be sure to keep the wound moist with ointment 24/7 to ensure best healing. If you are unable to cover the wound with a bandage to hold the ointment in place, you may need to reapply the ointment several times a day. Do not bend over or lift heavy items to reduce the chance of elevated blood pressure to the wound. Do not participate in particularly strenuous activities.  Below is a list of dressing supplies you might need.  Cotton-tipped applicators - Q-tips Gauze pads (2x2 and/or 4x4) - All-Purpose Sponges New and clean tube of petroleum jelly (Vaseline) OR prescription antibiotic ointment if prescribed Either a bandaid large enough to cover the entire wound OR non-stick dressing material (Telfa) and Tape (Paper or Hypafix)  FOR ADULT SURGERY PATIENTS: If you need something for pain relief, you may take 1 extra strength Tylenol (acetaminophen) and 2  ibuprofen (200 mg) together every 4 hours as needed. (Do not take these medications if you are allergic to them or if you know you cannot take them for any other reason). Typically you may only need pain medication for 1-3 days.   Comments on the Post-Operative Period Slight swelling and redness often appear around the wound. This is normal and will disappear within several days following the surgery. The healing wound will drain a brownish-red-yellow discharge during healing. This is a normal phase of wound healing. As the wound begins to heal, the drainage may increase in amount. Again, this drainage is normal. Notify us if the drainage becomes persistently bloody, excessively swollen, or intensely painful or develops a foul odor or red streaks.  The healing wound will also typically be itchy. This is normal. If you have severe or persistent pain, Notify us if the discomfort is severe or persistent. Avoid alcoholic beverages when taking pain medicine.  In Case of Wound Hemorrhage A wound hemorrhage is when the bandage suddenly becomes soaked with bright red blood and flows profusely. If this happens, sit down or lie down with your head elevated. If the wound has a dressing on it, do not remove the dressing. Apply pressure to the existing gauze. If the wound is not covered, use a gauze pad to apply pressure and continue applying the pressure for 20 minutes without peeking. DO NOT COVER THE WOUND WITH A LARGE TOWEL OR WASH CLOTH. Release your hand from the   wound site but do not remove the dressing. If the bleeding has stopped, gently clean around the wound. Leave the dressing in place for 24 hours if possible. This wait time allows the blood vessels to close off so that you do not spark a new round of bleeding by disrupting the newly clotted blood vessels with an immediate dressing change. If the bleeding does not subside, continue to hold pressure for 40 minutes. If bleeding continues, page your  physician, contact an After Hours clinic or go to the Emergency Room.  Due to recent changes in healthcare laws, you may see results of your pathology and/or laboratory studies on MyChart before the doctors have had a chance to review them. We understand that in some cases there may be results that are confusing or concerning to you. Please understand that not all results are received at the same time and often the doctors may need to interpret multiple results in order to provide you with the best plan of care or course of treatment. Therefore, we ask that you please give us 2 business days to thoroughly review all your results before contacting the office for clarification. Should we see a critical lab result, you will be contacted sooner.   If You Need Anything After Your Visit  If you have any questions or concerns for your doctor, please call our main line at [phone] and press option 4 to reach your doctor's medical assistant. If no one answers, please leave a voicemail as directed and we will return your call as soon as possible. Messages left after 4 pm will be answered the following business day.   You may also send us a message via MyChart. We typically respond to MyChart messages within 1-2 business days.  For prescription refills, please ask your pharmacy to contact our office. Our fax number is [phone].  If you have an urgent issue when the clinic is closed that cannot wait until the next business day, you can page your doctor at the number below.    Please note that while we do our best to be available for urgent issues outside of office hours, we are not available 24/7.   If you have an urgent issue and are unable to reach us, you may choose to seek medical care at your doctor's office, retail clinic, urgent care center, or emergency room.  If you have a medical emergency, please immediately call 911 or go to the emergency department.  Pager Numbers  - Dr. Kowalski:  [phone]  - Dr. Moye: [phone]  - Dr. Stewart: [phone]  In the event of inclement weather, please call our main line at [phone] for an update on the status of any delays or closures.  Dermatology Medication Tips: Please keep the boxes that topical medications come in in order to help keep track of the instructions about where and how to use these. Pharmacies typically print the medication instructions only on the boxes and not directly on the medication tubes.   If your medication is too expensive, please contact our office at [phone] option 4 or send us a message through MyChart.   We are unable to tell what your co-pay for medications will be in advance as this is different depending on your insurance coverage. However, we may be able to find a substitute medication at lower cost or fill out paperwork to get insurance to cover a needed medication.   If a prior authorization is required to get your medication covered by   your insurance company, please allow us 1-2 business days to complete this process.  Drug prices often vary depending on where the prescription is filled and some pharmacies may offer cheaper prices.  The website www.goodrx.com contains coupons for medications through different pharmacies. The prices here do not account for what the cost may be with help from insurance (it may be cheaper with your insurance), but the website can give you the price if you did not use any insurance.  - You can print the associated coupon and take it with your prescription to the pharmacy.  - You may also stop by our office during regular business hours and pick up a GoodRx coupon card.  - If you need your prescription sent electronically to a different pharmacy, notify our office through [facility] MyChart or by phone at [phone] option 4.     Si Usted Necesita Algo Despus de Su Visita  Tambin puede enviarnos un mensaje a travs de MyChart. Por lo general  respondemos a los mensajes de MyChart en el transcurso de 1 a 2 das hbiles.  Para renovar recetas, por favor pida a su farmacia que se ponga en contacto con nuestra oficina. Nuestro nmero de fax es el [phone].  Si tiene un asunto urgente cuando la clnica est cerrada y que no puede esperar hasta el siguiente da hbil, puede llamar/localizar a su doctor(a) al nmero que aparece a continuacin.   Por favor, tenga en cuenta que aunque hacemos todo lo posible para estar disponibles para asuntos urgentes fuera del horario de oficina, no estamos disponibles las 24 horas del da, los 7 das de la semana.   Si tiene un problema urgente y no puede comunicarse con nosotros, puede optar por buscar atencin mdica  en el consultorio de su doctor(a), en una clnica privada, en un centro de atencin urgente o en una sala de emergencias.  Si tiene una emergencia mdica, por favor llame inmediatamente al 911 o vaya a la sala de emergencias.  Nmeros de bper  - Dr. Kowalski: [phone]  - Dra. Moye: [phone]  - Dra. Stewart: [phone]  En caso de inclemencias del tiempo, por favor llame a nuestra lnea principal al [phone] para una actualizacin sobre el estado de cualquier retraso o cierre.  Consejos para la medicacin en dermatologa: Por favor, guarde las cajas en las que vienen los medicamentos de uso tpico para ayudarle a seguir las instrucciones sobre dnde y cmo usarlos. Las farmacias generalmente imprimen las instrucciones del medicamento slo en las cajas y no directamente en los tubos del medicamento.   Si su medicamento es muy caro, por favor, pngase en contacto con nuestra oficina llamando al [phone] y presione la opcin 4 o envenos un mensaje a travs de MyChart.   No podemos decirle cul ser su copago por los medicamentos por adelantado ya que esto es diferente dependiendo de la cobertura de su seguro. Sin embargo, es posible que podamos encontrar un  medicamento sustituto a menor costo o llenar un formulario para que el seguro cubra el medicamento que se considera necesario.   Si se requiere una autorizacin previa para que su compaa de seguros cubra su medicamento, por favor permtanos de 1 a 2 das hbiles para completar este proceso.  Los precios de los medicamentos varan con frecuencia dependiendo del lugar de dnde se surte la receta y alguna farmacias pueden ofrecer precios ms baratos.  El sitio web www.goodrx.com tiene cupones para medicamentos de diferentes farmacias. Los precios aqu no tienen   en cuenta lo que podra costar con la ayuda del seguro (puede ser ms barato con su seguro), pero el sitio web puede darle el precio si no utiliz ningn seguro.  - Puede imprimir el cupn correspondiente y llevarlo con su receta a la farmacia.  - Tambin puede pasar por nuestra oficina durante el horario de atencin regular y recoger una tarjeta de cupones de GoodRx.  - Si necesita que su receta se enve electrnicamente a una farmacia diferente, informe a nuestra oficina a travs de MyChart de [facility] o por telfono llamando al [phone] y presione la opcin 4.  

## 2022-08-02 ENCOUNTER — Other Ambulatory Visit (HOSPITAL_COMMUNITY): Payer: Self-pay

## 2022-08-02 ENCOUNTER — Telehealth: Payer: Self-pay

## 2022-08-02 ENCOUNTER — Ambulatory Visit: Payer: 59 | Admitting: Family Medicine

## 2022-08-02 ENCOUNTER — Encounter: Payer: Self-pay | Admitting: Dermatology

## 2022-08-02 NOTE — Telephone Encounter (Signed)
Patient advised pathology results showed benign cyst, no further treatment needed. Lurlean Horns., RMA

## 2022-08-02 NOTE — Telephone Encounter (Signed)
LVM for patient to see how she is doing after yesterday's surgery.  Lurlean Horns., RMA

## 2022-08-02 NOTE — Telephone Encounter (Signed)
-----   Message from Alfonso Patten, MD sent at 08/02/2022  4:54 PM EDT ----- Skin (M), right axilla EPIDERMAL INCLUSION CYST --> benign cyst, no additional treatment needed  MAs please call. Thank you!

## 2022-08-03 ENCOUNTER — Other Ambulatory Visit (HOSPITAL_COMMUNITY): Payer: Self-pay

## 2022-08-06 ENCOUNTER — Other Ambulatory Visit: Payer: Self-pay | Admitting: Gastroenterology

## 2022-08-06 ENCOUNTER — Other Ambulatory Visit: Payer: Self-pay | Admitting: Internal Medicine

## 2022-08-06 ENCOUNTER — Other Ambulatory Visit: Payer: Self-pay

## 2022-08-06 ENCOUNTER — Telehealth: Payer: Self-pay | Admitting: *Deleted

## 2022-08-06 MED ORDER — LINACLOTIDE 290 MCG PO CAPS
290.0000 ug | ORAL_CAPSULE | Freq: Every day | ORAL | 3 refills | Status: DC
  Filled 2022-08-06: qty 90, 90d supply, fill #0
  Filled 2022-11-09: qty 90, 90d supply, fill #1
  Filled 2023-02-07: qty 90, 90d supply, fill #2
  Filled 2023-05-12: qty 90, 90d supply, fill #3

## 2022-08-06 MED ORDER — CYCLOBENZAPRINE HCL 10 MG PO TABS
ORAL_TABLET | ORAL | 2 refills | Status: DC
  Filled 2022-08-06: qty 27, 27d supply, fill #0
  Filled 2022-08-06: qty 3, 3d supply, fill #0
  Filled 2022-09-03: qty 30, 30d supply, fill #1
  Filled 2022-10-03: qty 30, 30d supply, fill #2

## 2022-08-06 NOTE — Telephone Encounter (Signed)
Patient contacted the office asking about her lab results. Patient states her last labs showed her Hgb and HCT are a little low. Patient states she does see someone for this but they are out of town for 2 weeks. She wanted to know if she should do anything about this. Patient advised they are only slightly out of the normal range and we are going to monitor it.

## 2022-08-08 ENCOUNTER — Other Ambulatory Visit (HOSPITAL_COMMUNITY): Payer: Self-pay

## 2022-08-09 ENCOUNTER — Other Ambulatory Visit: Payer: Self-pay

## 2022-08-09 ENCOUNTER — Ambulatory Visit
Admission: RE | Admit: 2022-08-09 | Discharge: 2022-08-09 | Disposition: A | Discharge status: Home / Self Care | Payer: 59 | Source: Ambulatory Visit | Attending: Nurse Practitioner | Admitting: Nurse Practitioner

## 2022-08-09 ENCOUNTER — Ambulatory Visit (INDEPENDENT_AMBULATORY_CARE_PROVIDER_SITE_OTHER): Payer: 59 | Admitting: Dermatology

## 2022-08-09 VITALS — BP 158/78 | HR 82 | Temp 98.4°F | Resp 16 | Ht 63.0 in | Wt 171.1 lb

## 2022-08-09 DIAGNOSIS — E669 Obesity, unspecified: Secondary | ICD-10-CM | POA: Insufficient documentation

## 2022-08-09 DIAGNOSIS — Z4802 Encounter for removal of sutures: Secondary | ICD-10-CM

## 2022-08-09 DIAGNOSIS — Z6830 Body mass index (BMI) 30.0-30.9, adult: Secondary | ICD-10-CM | POA: Diagnosis not present

## 2022-08-09 DIAGNOSIS — R051 Acute cough: Secondary | ICD-10-CM | POA: Insufficient documentation

## 2022-08-09 DIAGNOSIS — Z20822 Contact with and (suspected) exposure to covid-19: Secondary | ICD-10-CM | POA: Diagnosis not present

## 2022-08-09 LAB — RESP PANEL BY RT-PCR (FLU A&B, COVID) ARPGX2
Influenza A by PCR: NEGATIVE
Influenza B by PCR: NEGATIVE
SARS Coronavirus 2 by RT PCR: NEGATIVE

## 2022-08-09 MED ORDER — PREDNISONE 10 MG PO TABS
ORAL_TABLET | ORAL | 0 refills | Status: DC
  Filled 2022-08-09: qty 21, 6d supply, fill #0

## 2022-08-09 MED ORDER — GUAIFENESIN-CODEINE 100-10 MG/5ML PO SOLN: 5.0000 mL | Freq: Three times a day (TID) | ORAL | 0 refills | Status: DC | PRN

## 2022-08-09 MED ORDER — GUAIFENESIN-CODEINE 100-10 MG/5ML PO SYRP
ORAL_SOLUTION | ORAL | 0 refills | Status: DC
  Filled 2022-08-09: qty 120, 8d supply, fill #0

## 2022-08-09 MED ORDER — BENZONATATE 100 MG PO CAPS
100.0000 mg | ORAL_CAPSULE | Freq: Three times a day (TID) | ORAL | 0 refills | Status: AC | PRN
  Filled 2022-08-09: qty 30, 10d supply, fill #0

## 2022-08-09 NOTE — ED Triage Notes (Signed)
Pt c/o cough, nasal congestion. Started about 11 days ago. Denies fever.

## 2022-08-09 NOTE — Patient Instructions (Signed)
Due to recent changes in healthcare laws, you may see results of your pathology and/or laboratory studies on MyChart before the doctors have had a chance to review them. We understand that in some cases there may be results that are confusing or concerning to you. Please understand that not all results are received at the same time and often the doctors may need to interpret multiple results in order to provide you with the best plan of care or course of treatment. Therefore, we ask that you please give us 2 business days to thoroughly review all your results before contacting the office for clarification. Should we see a critical lab result, you will be contacted sooner.   If You Need Anything After Your Visit  If you have any questions or concerns for your doctor, please call our main line at [phone] and press option 4 to reach your doctor's medical assistant. If no one answers, please leave a voicemail as directed and we will return your call as soon as possible. Messages left after 4 pm will be answered the following business day.   You may also send us a message via MyChart. We typically respond to MyChart messages within 1-2 business days.  For prescription refills, please ask your pharmacy to contact our office. Our fax number is [phone].  If you have an urgent issue when the clinic is closed that cannot wait until the next business day, you can page your doctor at the number below.    Please note that while we do our best to be available for urgent issues outside of office hours, we are not available 24/7.   If you have an urgent issue and are unable to reach us, you may choose to seek medical care at your doctor's office, retail clinic, urgent care center, or emergency room.  If you have a medical emergency, please immediately call 911 or go to the emergency department.  Pager Numbers  - Dr. Kowalski: [phone]  - Dr. Moye: [phone]  - Dr. Stewart:  [phone]  In the event of inclement weather, please call our main line at [phone] for an update on the status of any delays or closures.  Dermatology Medication Tips: Please keep the boxes that topical medications come in in order to help keep track of the instructions about where and how to use these. Pharmacies typically print the medication instructions only on the boxes and not directly on the medication tubes.   If your medication is too expensive, please contact our office at [phone] option 4 or send us a message through MyChart.   We are unable to tell what your co-pay for medications will be in advance as this is different depending on your insurance coverage. However, we may be able to find a substitute medication at lower cost or fill out paperwork to get insurance to cover a needed medication.   If a prior authorization is required to get your medication covered by your insurance company, please allow us 1-2 business days to complete this process.  Drug prices often vary depending on where the prescription is filled and some pharmacies may offer cheaper prices.  The website www.goodrx.com contains coupons for medications through different pharmacies. The prices here do not account for what the cost may be with help from insurance (it may be cheaper with your insurance), but the website can give you the price if you did not use any insurance.  - You can print the associated coupon and take it with   your prescription to the pharmacy.  - You may also stop by our office during regular business hours and pick up a GoodRx coupon card.  - If you need your prescription sent electronically to a different pharmacy, notify our office through [facility] MyChart or by phone at [phone] option 4.     Si Usted Necesita Algo Despus de Su Visita  Tambin puede enviarnos un mensaje a travs de MyChart. Por lo general respondemos a los mensajes de MyChart en el transcurso de 1 a 2  das hbiles.  Para renovar recetas, por favor pida a su farmacia que se ponga en contacto con nuestra oficina. Nuestro nmero de fax es el [phone].  Si tiene un asunto urgente cuando la clnica est cerrada y que no puede esperar hasta el siguiente da hbil, puede llamar/localizar a su doctor(a) al nmero que aparece a continuacin.   Por favor, tenga en cuenta que aunque hacemos todo lo posible para estar disponibles para asuntos urgentes fuera del horario de oficina, no estamos disponibles las 24 horas del da, los 7 das de la semana.   Si tiene un problema urgente y no puede comunicarse con nosotros, puede optar por buscar atencin mdica  en el consultorio de su doctor(a), en una clnica privada, en un centro de atencin urgente o en una sala de emergencias.  Si tiene una emergencia mdica, por favor llame inmediatamente al 911 o vaya a la sala de emergencias.  Nmeros de bper  - Dr. Kowalski: [phone]  - Dra. Moye: [phone]  - Dra. Stewart: [phone]  En caso de inclemencias del tiempo, por favor llame a nuestra lnea principal al [phone] para una actualizacin sobre el estado de cualquier retraso o cierre.  Consejos para la medicacin en dermatologa: Por favor, guarde las cajas en las que vienen los medicamentos de uso tpico para ayudarle a seguir las instrucciones sobre dnde y cmo usarlos. Las farmacias generalmente imprimen las instrucciones del medicamento slo en las cajas y no directamente en los tubos del medicamento.   Si su medicamento es muy caro, por favor, pngase en contacto con nuestra oficina llamando al [phone] y presione la opcin 4 o envenos un mensaje a travs de MyChart.   No podemos decirle cul ser su copago por los medicamentos por adelantado ya que esto es diferente dependiendo de la cobertura de su seguro. Sin embargo, es posible que podamos encontrar un medicamento sustituto a menor costo o llenar un formulario para que el  seguro cubra el medicamento que se considera necesario.   Si se requiere una autorizacin previa para que su compaa de seguros cubra su medicamento, por favor permtanos de 1 a 2 das hbiles para completar este proceso.  Los precios de los medicamentos varan con frecuencia dependiendo del lugar de dnde se surte la receta y alguna farmacias pueden ofrecer precios ms baratos.  El sitio web www.goodrx.com tiene cupones para medicamentos de diferentes farmacias. Los precios aqu no tienen en cuenta lo que podra costar con la ayuda del seguro (puede ser ms barato con su seguro), pero el sitio web puede darle el precio si no utiliz ningn seguro.  - Puede imprimir el cupn correspondiente y llevarlo con su receta a la farmacia.  - Tambin puede pasar por nuestra oficina durante el horario de atencin regular y recoger una tarjeta de cupones de GoodRx.  - Si necesita que su receta se enve electrnicamente a una farmacia diferente, informe a nuestra oficina a travs de MyChart de [place]   o por telfono llamando al [phone] y presione la opcin 4.  

## 2022-08-09 NOTE — Progress Notes (Signed)
   Follow-Up Visit   Subjective  [name] is a 56 y.o. female who presents for the following: Suture / Staple Removal (Patient here today for suture removal. ).   The following portions of the chart were reviewed this encounter and updated as appropriate:   Tobacco  Allergies  Meds  Problems  Med Hx  Surg Hx  Fam Hx      Review of Systems:  No other skin or systemic complaints except as noted in HPI or Assessment and Plan.  Objective  Well appearing patient in no apparent distress; mood and affect are within normal limits.  A focused examination was performed including right axilla. Relevant physical exam findings are noted in the Assessment and Plan.    Assessment & Plan   Encounter for Removal of Sutures - Incision site at the right axilla is clean, dry and intact - Wound cleansed, sutures removed, wound cleansed and steri strips applied.  - Discussed pathology results showing benign cyst  - Patient advised to keep steri-strips dry until they fall off. - Scars remodel for a full year. - Once steri-strips fall off, patient can apply over-the-counter silicone scar cream each night to help with scar remodeling if desired. - Patient advised to call with any concerns or if they notice any new or changing lesions.  Return for Folliculitis, as scheduled.  Graciella Belton, RMA, am acting as scribe for Forest Gleason, MD .  Documentation: I have reviewed the above documentation for accuracy and completeness, and I agree with the above.  Forest Gleason, MD

## 2022-08-09 NOTE — ED Provider Notes (Signed)
[facility]    CSN: [id number] Arrival date & time: 08/09/22  1150      History   Chief Complaint Chief Complaint  Patient presents with   Cough   Appointment    HPI [name] is a 56 y.o. female.   HPI   She is complaining of a 11 day history of nasal congestion with cough. She reports the phlegm is clear. She has been using daytime and nighttime cough medication with minimal relief.. Denies fever, chills, headache, dizziness, visual changes, shortness of breath, dyspnea on exertion, chest pain, nausea, vomiting. Her daughter did have a stomach virus.  Past Medical History:  Diagnosis Date   ACUT MI ANTEROLAT WALL SUBSQT EPIS CARE    Acute maxillary sinusitis    ALLERGIC RHINITIS, SEASONAL    Anemia    ARTHRITIS, RHEUMATOID    shoulders and hands Enbrel>leg swelling Dr. Estanislado Pandy   Atrial fibrillation [facility])    a. after CABG.   Back pain    Bruxism (teeth grinding)    CAD, ARTERY BYPASS GRAFT    a. DES to RCA in 2010 then LAD occlusion s/p CABG 3 06/07/2009 with LIMA to LAD, reverse SVG to D1, reverse SVG to distal RCA. b. Cath 05/08/2016 slightly hypodense region in the intermediate branch, however she had excellent flow, FFR was normal. Vein graft to PDA and the posterolateral branch is patent, patent LIMA to LAD, occluded SVG to diagonal.   CARPAL TUNNEL SYNDROME, BILATERAL    CHOLELITHIASIS    Contrast media allergy    DERMATITIS, ALLERGIC    DIABETES MELLITUS, TYPE I    on insulin pump dx'ed age 62 y.o    DIABETIC  RETINOPATHY    Hiatal hernia    HYPERLIPIDEMIA-MIXED    HYPERTHYROIDISM    Dr. Dollene Cleveland   IDA (iron deficiency anemia)    Insulin pump in place    Lymphadenopathy of head and neck    MIGRAINE W/O AURA W/INTRACT W/STATUS MIGRAINOSUS 02/19/2008   PONV (postoperative nausea and vomiting)    Psoriasis    SINUS TACHYCARDIA 11/08/2010   Sleep apnea    not using machine yet    SVT (supraventricular tachycardia) ([place])    after s/p  CABG   TRIGGER FINGER    all fingers b/l hands    URI     Patient Active Problem List   Diagnosis Date Noted   Premature atrial contractions 05/15/2022   PVC's (premature ventricular contractions) 05/15/2022   Iron deficiency anemia 01/16/2022   Right ear pain 01/09/2022   Sore throat 01/09/2022   OSA on CPAP 08/02/2021   Abnormal MRI, lumbar spine 03/20/2021   Abnormal MRI, thoracic spine 03/20/2021   Chronic mid back pain 03/17/2021   Chronic midline low back pain with left-sided sciatica 03/17/2021   Type 1 diabetes mellitus with diabetic polyneuropathy ([place]) 02/17/2021   Severe obstructive sleep apnea-hypopnea syndrome 02/17/2021   Chronic diastolic CHF (congestive heart failure) ([place]) 12/19/2020   Chronic back pain 12/19/2020   Chronic abdominal pain 71/05/2693   Acute diastolic heart failure ([place]) 12/09/2020   Diabetic retinopathy associated with type 1 diabetes mellitus ([place]) 09/27/2020   Diabetic retinopathy ([place]) 09/27/2020   Graves' disease 09/27/2020   Chronic coronary artery disease 09/27/2020   Hypertension associated with diabetes ([place]) 09/14/2020   Obesity (BMI 30-39.9) 09/14/2020   Annual physical exam 09/14/2020   Aortic atherosclerosis ([place]) 09/14/2020   Snoring 05/12/2020   Lung nodule 03/10/2020   Colitis 03/10/2020  Type 1 diabetes mellitus with proliferative retinopathy ([place]) 12/10/2019   Abnormal thyroid function test 12/10/2019   Bruxism (teeth grinding)    Chronic migraine 10/07/2019   Contusion of left hip 07/25/2019   H/O multiple allergies 03/10/2019   Hx of anaphylaxis 03/10/2019   Cough 06/26/2018   PND (post-nasal drip) 06/26/2018   Lumbar strain, initial encounter 04/10/2018   Thoracic myofascial strain 04/10/2018   Vitamin D deficiency 07/04/2017   B12 deficiency 07/04/2017   Abnormal CT scan 06/19/2017   Gastroesophageal reflux disease 06/19/2017   Antiplatelet or antithrombotic long-term use 06/19/2017   Sinus congestion  03/15/2017   Graves disease 02/01/2017   Sleep difficulties 02/01/2017   No energy 02/01/2017   Osteopenia 02/01/2017   Bilateral hand pain 07/16/2016   Acquired trigger finger 07/16/2016   Hypertension, essential 05/08/2016   Foot pain, right 08/30/2014   Shoulder pain, right 08/30/2014   Chronic pain syndrome 01/15/2014   Multiple joint pain 01/15/2014   Lesion of lower eyelid 04/21/2013   Generalized constriction of visual field 03/31/2013   Neoplasm of uncertain behavior of skin of eyelid 03/31/2013   Posterior capsular opacification 03/31/2013   Cyst of left lower eyelid 03/30/2013   Pseudophakia of both eyes 03/30/2013   Prurigo nodularis 02/05/2013   Stasis dermatitis 02/05/2013   Psoriasis 01/21/2013   Trochanteric bursitis 11/10/2012   Achilles tendinitis 07/11/2012   Epiretinal membrane 06/17/2012   Status post cataract extraction 04/30/2012   Pes equinus, acquired 04/23/2012   Tendinitis of ankle 04/23/2012   Proliferative diabetic retinopathy of both eyes ([place]) 01/11/2012   Ongoing use of possibly toxic medication 12/05/2011   Hyperthyroidism 11/09/2010   SINUS TACHYCARDIA 11/08/2010   DERMATITIS, ALLERGIC 07/20/2010   EDEMA 07/12/2010   Dizziness 11/14/2009   ATRIAL FIBRILLATION 07/20/2009   Hx of CABG 06/07/2009   ANGINA, STABLE/EXERTIONAL 06/02/2009   Dyslipidemia, goal LDL below 70 04/26/2009   ACUT MI ANTEROLAT WALL SUBSQT EPIS CARE 04/26/2009   Chest pain 04/26/2009   DIABETIC  RETINOPATHY 04/25/2009   CARPAL TUNNEL SYNDROME, BILATERAL 04/25/2009   TRIGGER FINGER 04/25/2009   MIGRAINE W/O AURA W/INTRACT W/STATUS MIGRAINOSUS 02/19/2008   ALLERGIC RHINITIS, SEASONAL 10/16/2006   CHOLELITHIASIS 10/16/2006   Rheumatoid arthritis ([place]) 10/16/2006    Past Surgical History:  Procedure Laterality Date   ABDOMINAL HYSTERECTOMY     endometriomas b/l; total ? cervix removed; no h/o abnormal pap   Caesarean section     CARDIAC CATHETERIZATION N/A 05/08/2016    Procedure: Left Heart Cath and Cors/Grafts Angiography;  Surgeon: Burnell Blanks, MD;  Location: [facility];  Service: Cardiovascular;  Laterality: N/A;   CARPAL TUNNEL RELEASE     b/l    CARPAL TUNNEL RELEASE Left 06/06/2021   Procedure: CARPAL TUNNEL RELEASE;  Surgeon: Daryll Brod, MD;  Location: [facility];  Service: Orthopedics;  Laterality: Left;  AXILLARY BLOCK   CATARACT EXTRACTION, BILATERAL     CHOLECYSTECTOMY     COLONOSCOPY WITH PROPOFOL N/A 03/25/2020   Procedure: COLONOSCOPY WITH PROPOFOL;  Surgeon: Jonathon Bellows, MD;  Location: [facility] ENDOSCOPY;  Service: Gastroenterology;  Laterality: N/A;   CORONARY ARTERY BYPASS GRAFT     ESOPHAGOGASTRODUODENOSCOPY (EGD) WITH PROPOFOL N/A 03/25/2020   Procedure: ESOPHAGOGASTRODUODENOSCOPY (EGD) WITH PROPOFOL;  Surgeon: Jonathon Bellows, MD;  Location: [facility] ENDOSCOPY;  Service: Gastroenterology;  Laterality: N/A;   EYE SURGERY     laser x 2 for retinopathy    LEFT HEART CATH AND CORS/GRAFTS ANGIOGRAPHY N/A 02/22/2020  Procedure: LEFT HEART CATH AND CORS/GRAFTS ANGIOGRAPHY;  Surgeon: Burnell Blanks, MD;  Location: [facility];  Service: Cardiovascular;  Laterality: N/A;   TRIGGER FINGER RELEASE     b/l fingers all    ULNAR NERVE TRANSPOSITION Left 06/06/2021   Procedure: ULNAR NERVE DECOMPRESSION;  Surgeon: Daryll Brod, MD;  Location: [facility];  Service: Orthopedics;  Laterality: Left;  AXILLARY BLOCK   VITRECTOMY     b/l     OB History   No obstetric history on file.      Home Medications    Prior to Admission medications   Medication Sig Start Date End Date Taking? Authorizing Provider  albuterol (VENTOLIN HFA) 108 (90 Base) MCG/ACT inhaler Inhale 2 puffs into the lungs every 4 (four) hours as needed for wheezing or shortness of breath. 04/12/22  Yes Rodriguez-Southworth, Sandrea Matte  aspirin EC 81 MG tablet Take 81 mg by mouth daily.   Yes [provider]  benzonatate  (TESSALON) 100 MG capsule Take 1 capsule (100 mg total) by mouth 3 (three) times daily as needed for up to 10 days for cough. Never suck or chew on a benzonatate capsule. 08/09/22 08/19/22 Yes Vevelyn Francois, NP  CHELATED MAGNESIUM PO Take 250 mg by mouth daily.   Yes [provider]  clopidogrel (PLAVIX) 75 MG tablet TAKE 1 TABLET BY MOUTH ONCE DAILY 07/12/22  Yes Sherren Mocha, MD  cyclobenzaprine (FLEXERIL) 10 MG tablet TAKE ONE TABLET BY MOUTH AT BEDTIME. 08/06/22  Yes Dutch Quint B, FNP  guaiFENesin-codeine 100-10 MG/5ML syrup Take 5 mLs by mouth 3 (three) times daily as needed for cough. 08/09/22  Yes Shellsea Borunda, Diona Foley, NP  insulin detemir (LEVEMIR) 100 UNIT/ML injection Inject 36 units into the skin once daily in the event of insulin pump failure. 07/09/22  Yes   insulin lispro (HUMALOG) 100 UNIT/ML injection INJECT UP TO 90 UNITS DAILY IN INSULIN PUMP 03/07/22  Yes   isosorbide mononitrate (IMDUR) 60 MG 24 hr tablet TAKE 1 TABLET BY MOUTH DAILY. 03/23/22  Yes Sherren Mocha, MD  Krill Oil 500 MG CAPS Take 500 mg by mouth daily.   Yes [provider]  leflunomide (ARAVA) 20 MG tablet TAKE 1 TABLET BY MOUTH DAILY 06/20/22 06/20/23 Yes Ofilia Neas, PA-C  linaclotide [facility]) 290 MCG CAPS capsule Take 1 capsule (290 mcg total) by mouth daily before breakfast. 08/06/22  Yes Jonathon Bellows, MD  losartan (COZAAR) 100 MG tablet TAKE 1 TABLET BY MOUTH DAILY 04/09/22 04/09/23 Yes Sherren Mocha, MD  metoprolol tartrate (LOPRESSOR) 50 MG tablet Take 1 tablet (50 mg total) by mouth 2 (two) times daily. 05/23/22  Yes Sherren Mocha, MD  ondansetron (ZOFRAN-ODT) 4 MG disintegrating tablet Take 4 mg by mouth every 8 (eight) hours as needed. 08/28/20  Yes [provider]  ONE TOUCH ULTRA TEST test strip  11/20/16  Yes [provider]  pantoprazole (PROTONIX) 40 MG tablet Take 1 tablet (40 mg total) by mouth 2 (two) times daily. 12/29/21 12/29/22 Yes Jonathon Bellows, MD  potassium chloride  (KLOR-CON) 10 MEQ tablet Take 1 tablet (10 mEq total) by mouth daily. 05/11/22 05/11/23 Yes Sherren Mocha, MD  predniSONE (DELTASONE) 10 MG tablet Take 6 tablets by mouth on day 1, then decrease by one tablet each day 08/09/22  Yes Vevelyn Francois, NP  rosuvastatin (CRESTOR) 20 MG tablet Take 1 tablet (20 mg total) by mouth daily. 06/22/22  Yes Sherren Mocha, MD  Semaglutide, 1 MG/DOSE, (  OZEMPIC, 1 MG/DOSE,) 4 MG/3ML SOPN Inject 1 mg into the skin once a week. 05/24/22  Yes   torsemide (DEMADEX) 20 MG tablet Take 1 tablet (20 mg total) by mouth daily. 03/07/22  Yes Sherren Mocha, MD  Upadacitinib ER (RINVOQ) 15 MG TB24 Take 1 tablet (15 mg) by mouth daily. 07/30/22 07/30/23 Yes Deveshwar, Abel Presto, MD  Blood Glucose Monitoring Suppl (FREESTYLE FREEDOM LITE) w/Device KIT Use as directed by physician to check blood sugar 02/27/22     clindamycin (CLEOCIN T) 1 % external solution Apply topically daily. Qd to scalp prn flares, qd to aa bumps buttocks, breast, inguinal folds 06/27/22 06/27/23  Moye, [place], MD  Continuous Blood Gluc Receiver (DEXCOM G6 RECEIVER) DEVI Dispense and use as directed 01/30/22     Continuous Blood Gluc Sensor (DEXCOM G6 SENSOR) MISC Dispense and use as directed.  Change sensor every 10 days. 01/30/22     Continuous Blood Gluc Transmit (DEXCOM G6 TRANSMITTER) MISC Dispense and use as directed.  Change transmitter every 90 days. 01/30/22     dicyclomine (BENTYL) 20 MG tablet Take 1 tablet (20 mg total) by mouth 3 (three) times daily as needed for spasms (abdominal pain). 09/13/20 07/19/22  Jonathon Bellows, MD  EPINEPHrine 0.3 mg/0.3 mL IJ SOAJ injection Inject 0.3 mg into the muscle as needed for anaphylaxis. 11/14/21   McLean-Scocuzza, Nino Glow, MD  glucose blood (FREESTYLE LITE) test strip Use to check blood sugar 3 time(s) daily 02/27/22     guaiFENesin-codeine (ROBITUSSIN AC) 100-10 MG/5ML syrup Take 5 mLs by mouth 3 (three) times daily as needed for cough. 08/09/22     Insulin Syringe-Needle  U-100 (BD INSULIN SYRINGE U/F) 31G X 5/16" 0.3 ML MISC use with insulin injections 3 times daily in the event of insulin pump failure. 11/22/21     ipratropium (ATROVENT) 0.06 % nasal spray Place 2 sprays into both nostrils 4 (four) times daily as needed for rhinitis. 11/14/21   McLean-Scocuzza, Nino Glow, MD  ketoconazole (NIZORAL) 2 % shampoo Apply 1 Application topically 3 (three) times a week. Wash scalp 3 times weekly, leave in for 10 minutes then rinse out 06/27/22   Moye, [place], MD  Lancets (FREESTYLE) lancets Use to check blood sugar 3 time(s) daily. 02/27/22     mupirocin ointment (BACTROBAN) 2 % Apply 1 Application topically as directed. Bid to tid to open sores in scalp 06/27/22   Moye, [place], MD  promethazine (PHENERGAN) 25 MG tablet TAKE 1/2- 1 TABLETS (12.5-25 MG TOTAL) BY MOUTH 2 TIMES DAILY AS NEEDED FOR NAUSEA OR VOMITING. 03/22/21 07/19/22  McLean-Scocuzza, Nino Glow, MD    Family History Family History  Problem Relation Age of Onset   Depression Mother    Anxiety disorder Mother    Colon polyps Father    Diabetes Father        2   Hypertension Father    Parkinson's disease Father    Stroke Paternal Grandmother    Heart disease Other    Hypertension Other    Hyperlipidemia Other    Depression Other    Migraines Other    Heart attack Neg Hx    Colon cancer Neg Hx    Stomach cancer Neg Hx    Esophageal cancer Neg Hx     Social History Social History   Tobacco Use   Smoking status: Never    Passive exposure: Never   Smokeless tobacco: Never  Vaping Use   Vaping Use: Never used  Substance Use Topics  Alcohol use: Yes    Comment: rarely 1 every 6 months   Drug use: No     Allergies   Actemra [tocilizumab], Ramipril, Shellfish-derived products, Atorvastatin, Compazine  [prochlorperazine edisylate], Emgality [galcanezumab-gnlm], Etanercept, Infliximab, Iohexol, Orencia [abatacept], Prochlorperazine edisylate, Tofacitinib, Trokendi xr [topiramate er], Tramadol,  Amiodarone, and Rituximab   Review of Systems Review of Systems   Physical Exam Triage Vital Signs ED Triage Vitals  Enc Vitals Group     BP 08/09/22 1235 (!) 158/78     Pulse Rate 08/09/22 1235 82     Resp 08/09/22 1235 16     Temp 08/09/22 1235 98.4 F (36.9 C)     Temp Source 08/09/22 1235 Oral     SpO2 08/09/22 1235 99 %     Weight 08/09/22 1232 171 lb 1.2 oz (77.6 kg)     Height 08/09/22 1232 _0  (1.6 m)     Head Circumference --      Peak Flow --      Pain Score 08/09/22 1231 5     Pain Loc --      Pain Edu? --      Excl. in [place]? --    No data found.  Updated Vital Signs BP (!) 158/78 (BP Location: Right Arm)   Pulse 82   Temp 98.4 F (36.9 C) (Oral)   Resp 16   Ht _1  (1.6 m)   Wt 171 lb 1.2 oz (77.6 kg)   SpO2 99%   BMI 30.30 kg/m   Visual Acuity Right Eye Distance:   Left Eye Distance:   Bilateral Distance:    Right Eye Near:   Left Eye Near:    Bilateral Near:     Physical Exam Constitutional:      General: She is not in acute distress.    Appearance: She is obese. She is not toxic-appearing or diaphoretic.  HENT:     Head: Normocephalic and atraumatic.     Right Ear: Tympanic membrane normal.     Left Ear: Tympanic membrane normal.     Nose: Nose normal.     Mouth/Throat:     Mouth: Mucous membranes are moist.  Cardiovascular:     Rate and Rhythm: Normal rate and regular rhythm.     Pulses: Normal pulses.     Heart sounds: Normal heart sounds.  Pulmonary:     Effort: Pulmonary effort is normal.     Comments: dimished Musculoskeletal:        General: Normal range of motion.     Cervical back: Normal range of motion.  Skin:    General: Skin is warm and dry.     Capillary Refill: Capillary refill takes less than 2 seconds.  Neurological:     General: No focal deficit present.     Mental Status: She is alert and oriented to person, place, and time.  Psychiatric:        Mood and Affect: Mood normal.        Behavior: Behavior  normal.      UC Treatments / Results  Labs (all labs ordered are listed, but only abnormal results are displayed) Labs Reviewed  RESP PANEL BY RT-PCR (FLU A&B, COVID) [id number]    EKG   Radiology No results found.  Procedures Procedures (including critical care time)  Medications Ordered in UC Medications - No data to display  Initial Impression / Assessment and Plan / UC Course  I have reviewed the triage vital signs and  the nursing notes.  Pertinent labs & imaging results that were available during my care of the patient were reviewed by me and considered in my medical decision making (see chart for details).    Cough Final Clinical Impressions(s) / UC Diagnoses   Final diagnoses:  Acute cough     Discharge Instructions      Your COVID, Influenza and Strep Test are all negative. Prednisone Dosepak  We encourage conservative treatment with symptom relief. We encourage you to use Tylenol alternating with Ibuprofen for your fever if not contraindicated. (Remember to use as directed do not exceed daily dosing recommendations) We also encourage salt water gargles for your sore throat. You should also consider throat lozenges and chloraseptic spray.  Your cough can be soothed with a cough suppressant, Guaifenesin-codeine 5 ml every 8 hours prn  We have prescribed you a cough suppressant to be taken as  directed.       ED Prescriptions     Medication Sig Dispense Auth. Provider   benzonatate (TESSALON) 100 MG capsule Take 1 capsule (100 mg total) by mouth 3 (three) times daily as needed for up to 10 days for cough. Never suck or chew on a benzonatate capsule. 30 capsule Dionisio David M, NP   guaiFENesin-codeine 100-10 MG/5ML syrup Take 5 mLs by mouth 3 (three) times daily as needed for cough. 120 mL Vevelyn Francois, NP   predniSONE (DELTASONE) 10 MG tablet Take 6 tablets by mouth on day 1, then decrease by one tablet each day 21 tablet Vevelyn Francois, NP      I  have reviewed the PDMP during this encounter.   Dionisio David [place], [place] 08/09/22 [phone]

## 2022-08-09 NOTE — Discharge Instructions (Addendum)
Your COVID, Influenza and Strep Test are all negative. Prednisone Dosepak  We encourage conservative treatment with symptom relief. We encourage you to use Tylenol alternating with Ibuprofen for your fever if not contraindicated. (Remember to use as directed do not exceed daily dosing recommendations) We also encourage salt water gargles for your sore throat. You should also consider throat lozenges and chloraseptic spray.  Your cough can be soothed with a cough suppressant, Guaifenesin-codeine 5 ml every 8 hours prn  We have prescribed you a cough suppressant to be taken as  directed.

## 2022-08-18 DIAGNOSIS — E10319 Type 1 diabetes mellitus with unspecified diabetic retinopathy without macular edema: Secondary | ICD-10-CM | POA: Diagnosis not present

## 2022-08-23 ENCOUNTER — Encounter: Payer: Self-pay | Admitting: Dermatology

## 2022-08-23 ENCOUNTER — Other Ambulatory Visit: Payer: Self-pay

## 2022-08-23 MED FILL — Clopidogrel Bisulfate Tab 75 MG (Base Equiv): ORAL | 30 days supply | Qty: 30 | Fill #1 | Status: AC

## 2022-08-31 ENCOUNTER — Other Ambulatory Visit (HOSPITAL_COMMUNITY): Payer: Self-pay

## 2022-08-31 LAB — PROTEIN / CREATININE RATIO, URINE: Creatinine, Urine: 18.5

## 2022-09-03 ENCOUNTER — Other Ambulatory Visit: Payer: Self-pay

## 2022-09-04 ENCOUNTER — Other Ambulatory Visit (HOSPITAL_COMMUNITY): Payer: Self-pay

## 2022-09-04 ENCOUNTER — Other Ambulatory Visit: Payer: Self-pay

## 2022-09-05 ENCOUNTER — Ambulatory Visit: Payer: 59 | Admitting: Nurse Practitioner

## 2022-09-05 ENCOUNTER — Encounter: Payer: Self-pay | Admitting: Nurse Practitioner

## 2022-09-05 VITALS — BP 110/62 | HR 75 | Temp 96.3°F | Resp 14 | Ht 63.0 in | Wt 171.0 lb

## 2022-09-05 DIAGNOSIS — K2100 Gastro-esophageal reflux disease with esophagitis, without bleeding: Secondary | ICD-10-CM

## 2022-09-05 DIAGNOSIS — E1159 Type 2 diabetes mellitus with other circulatory complications: Secondary | ICD-10-CM

## 2022-09-05 DIAGNOSIS — R058 Other specified cough: Secondary | ICD-10-CM | POA: Diagnosis not present

## 2022-09-05 DIAGNOSIS — I10 Essential (primary) hypertension: Secondary | ICD-10-CM | POA: Diagnosis not present

## 2022-09-05 DIAGNOSIS — M0609 Rheumatoid arthritis without rheumatoid factor, multiple sites: Secondary | ICD-10-CM | POA: Diagnosis not present

## 2022-09-05 DIAGNOSIS — E0500 Thyrotoxicosis with diffuse goiter without thyrotoxic crisis or storm: Secondary | ICD-10-CM

## 2022-09-05 DIAGNOSIS — D649 Anemia, unspecified: Secondary | ICD-10-CM | POA: Diagnosis not present

## 2022-09-05 DIAGNOSIS — I5032 Chronic diastolic (congestive) heart failure: Secondary | ICD-10-CM

## 2022-09-05 DIAGNOSIS — R0981 Nasal congestion: Secondary | ICD-10-CM

## 2022-09-05 DIAGNOSIS — I152 Hypertension secondary to endocrine disorders: Secondary | ICD-10-CM

## 2022-09-05 DIAGNOSIS — Z951 Presence of aortocoronary bypass graft: Secondary | ICD-10-CM | POA: Diagnosis not present

## 2022-09-05 DIAGNOSIS — I2510 Atherosclerotic heart disease of native coronary artery without angina pectoris: Secondary | ICD-10-CM

## 2022-09-05 DIAGNOSIS — E538 Deficiency of other specified B group vitamins: Secondary | ICD-10-CM | POA: Diagnosis not present

## 2022-09-05 DIAGNOSIS — E10319 Type 1 diabetes mellitus with unspecified diabetic retinopathy without macular edema: Secondary | ICD-10-CM

## 2022-09-05 NOTE — Assessment & Plan Note (Signed)
Recheck CBC today, pending result

## 2022-09-05 NOTE — Assessment & Plan Note (Signed)
Patient currently followed by Dr. Zigmund Daniel, specialist yearly.  Continue following with Dr. Zigmund Daniel as recommended

## 2022-09-05 NOTE — Assessment & Plan Note (Addendum)
Currently maintained on Imdur, losartan, metoprolol.  Continue taking medication as prescribed follow-up with cardiology as recommended

## 2022-09-05 NOTE — Assessment & Plan Note (Signed)
Patient currently managed by Dr. Sherren Mocha cardiologist.  Patient is on statin therapy.  Continue following with cardiology and taking medication as prescribed.

## 2022-09-05 NOTE — Assessment & Plan Note (Signed)
Followed by Dr. Sherren Mocha.  Continue taking medication as prescribed inclusive of antihypertensives and diuretics.

## 2022-09-05 NOTE — Assessment & Plan Note (Signed)
Has been going on for several months.  Patient is concerned she may have asthma or some other condition.  Albuterol inhaler does help when she uses it.  States she is currently on antihistamine, PPI, nasal steroid.  We will try her on Atrovent nasal spray if no improvement can consider using Singulair or ICS if felt warranted.

## 2022-09-05 NOTE — Progress Notes (Signed)
Established Patient Office Visit  Subjective   Patient ID: [name], female    DOB: 12/19/1966  Age: 56 y.o. MRN: [id number]  Chief Complaint  Patient presents with   Transfer of Care    From [facility]    Cough    And stuffy nose, lingering. Had this so severe that in August she went to Urgent care to be treated. Symptoms still lingering but mild. This has happened 2 times this year. Questioning asthma issue?   follow up/discuss low Hematocrit/Hemoglobin.    Patient presents for transfer of care   Rheumatoid arthritis: Patient currently managed by Dr. Corena Pilgrim, rheumatologist.  Patient is currently on Rinvoq and [place].  Patient also takes cyclobenzaprine 10 mg nightly that is managed by rheumatologist.  CHF/CABG/hypertension: Patient currently managed by Dr. Sherren Mocha.  He manages patient's torsemide, rosuvastatin, potassium, metoprolol, losartan, isosorbide mononitrate, and clopidogrel. DM1: Patient currently managed by Susette Racer endocrinologist.  Patient uses a mixture of fast acting insulin and insulin pump and Levemir if needed when insulin pump fails.  She also has a CGM.  Patient does have history of hyperthyroidism with Graves' disease that endocrinology also manages Diabetic retinopathy: Patient currently managed by Dr. Zigmund Daniel retina specialist.  Patient is followed yearly  Chronic constipation/GERD: Patient is currently being managed by Dr. Vicente Males.  He currently manages patient's pantoprazole and Linzess.  Patient states she has had 3 colonoscopies over the past 10 years    H/H: Patient is concerned because her hemoglobin and hematocrit was lower approximately month ago.  Prior to that was normal prior to the last normal was abnormal (low).  Patient has seen Dr.Rao in the past who is a hematology/oncology specialist and sees her on a as needed basis.  Cough: States that she has had 2 episodes over the last 6 months. States she has been labeled  as an allergic asthmatic. States that she still has cough at night and in the am and feels stuffy. States that she is on zyrtec currently. Has tried Flonase. States that the albuterol inhaler will help when she got really coughy. States that she has used it with the acute treatment. Twice over the past 10 months.     Review of Systems  Constitutional:  Positive for malaise/fatigue. Negative for chills and fever.  HENT:  Positive for congestion.   Respiratory:  Positive for cough. Negative for shortness of breath.   Cardiovascular:  Positive for leg swelling. Negative for chest pain.      Objective:     BP 110/62   Pulse 75   Temp (!) 96.3 F (35.7 C)   Resp 14   Ht '5\' 3"'$  (1.6 m)   Wt 171 lb (77.6 kg)   SpO2 94%   BMI 30.29 kg/m    Physical Exam Vitals and nursing note reviewed.  Constitutional:      Appearance: Normal appearance. She is obese.  HENT:     Right Ear: Tympanic membrane, ear canal and external ear normal.     Left Ear: Tympanic membrane, ear canal and external ear normal.     Mouth/Throat:     Mouth: Mucous membranes are moist.     Pharynx: Oropharynx is clear.  Cardiovascular:     Rate and Rhythm: Normal rate and regular rhythm.     Pulses: Normal pulses.     Heart sounds: Normal heart sounds.  Pulmonary:     Effort: Pulmonary effort is normal.     Breath sounds:  Normal breath sounds.  Musculoskeletal:     Right lower leg: No edema.     Left lower leg: No edema.  Neurological:     General: No focal deficit present.     Mental Status: She is alert.     Comments: Bilateral upper and lower extremity strength 5/5  Psychiatric:        Mood and Affect: Mood normal.        Behavior: Behavior normal.        Thought Content: Thought content normal.        Judgment: Judgment normal.      No results found for any visits on 09/05/22.    The ASCVD Risk score (Arnett DK, et al., 2019) failed to calculate for the following reasons:   The patient has a  prior MI or stroke diagnosis    Assessment & Plan:   Problem List Items Addressed This Visit       Cardiovascular and Mediastinum   Hypertension, essential   Hypertension associated with diabetes ([place])    Currently maintained on Imdur, losartan, metoprolol.  Continue taking medication as prescribed follow-up with cardiology as recommended      Chronic coronary artery disease    Patient currently managed by Dr. Sherren Mocha cardiologist.  Patient is on statin therapy.  Continue following with cardiology and taking medication as prescribed.      Chronic diastolic CHF (congestive heart failure) ([place])    Followed by Dr. Sherren Mocha.  Continue taking medication as prescribed inclusive of antihypertensives and diuretics.        Digestive   Gastroesophageal reflux disease     Endocrine   Graves disease   Diabetic retinopathy [facility])    Patient currently followed by Dr. Zigmund Daniel, specialist yearly.  Continue following with Dr. Zigmund Daniel as recommended        Musculoskeletal and Integument   Rheumatoid arthritis ([place])     Other   Hx of CABG   Nasal congestion    Ongoing issue.  Patient does use Flonase regularly does have a prescription for Atrovent nasal spray the patient has quit using.  She is to be on Singulair in the past.  We will ask patient to use ipratropium nasal spray for approximately 10 days a day via MyChart.  We will consider adding back on Singulair at that point      B12 deficiency   Cough    Has been going on for several months.  Patient is concerned she may have asthma or some other condition.  Albuterol inhaler does help when she uses it.  States she is currently on antihistamine, PPI, nasal steroid.  We will try her on Atrovent nasal spray if no improvement can consider using Singulair or ICS if felt warranted.      Low hemoglobin - Primary    Recheck CBC today, pending result      Relevant Orders   CBC    Return in about 3 months (around  12/05/2022) for CPE and labs.    Romilda Garret, NP

## 2022-09-05 NOTE — Assessment & Plan Note (Signed)
Ongoing issue.  Patient does use Flonase regularly does have a prescription for Atrovent nasal spray the patient has quit using.  She is to be on Singulair in the past.  We will ask patient to use ipratropium nasal spray for approximately 10 days a day via MyChart.  We will consider adding back on Singulair at that point

## 2022-09-05 NOTE — Patient Instructions (Addendum)
Nice to see you today Try using the atrovent nasal spray for approx 10 days and then update me if it helps. If not we cna consider singular. Follow up with me in 3 months for your physical and labs, sooner if you need me

## 2022-09-06 LAB — CBC
HCT: 35.2 % — ABNORMAL LOW (ref 36.0–46.0)
Hemoglobin: 12.2 g/dL (ref 12.0–15.0)
MCHC: 34.6 g/dL (ref 30.0–36.0)
MCV: 88.5 fl (ref 78.0–100.0)
Platelets: 243 10*3/uL (ref 150.0–400.0)
RBC: 3.98 Mil/uL (ref 3.87–5.11)
RDW: 13.9 % (ref 11.5–15.5)
WBC: 4.8 10*3/uL (ref 4.0–10.5)

## 2022-09-10 ENCOUNTER — Other Ambulatory Visit (HOSPITAL_COMMUNITY): Payer: Self-pay

## 2022-09-17 DIAGNOSIS — E10319 Type 1 diabetes mellitus with unspecified diabetic retinopathy without macular edema: Secondary | ICD-10-CM | POA: Diagnosis not present

## 2022-09-17 DIAGNOSIS — E0500 Thyrotoxicosis with diffuse goiter without thyrotoxic crisis or storm: Secondary | ICD-10-CM | POA: Diagnosis not present

## 2022-09-17 DIAGNOSIS — Z9641 Presence of insulin pump (external) (internal): Secondary | ICD-10-CM | POA: Diagnosis not present

## 2022-09-19 ENCOUNTER — Ambulatory Visit: Payer: 59 | Admitting: Internal Medicine

## 2022-09-20 ENCOUNTER — Other Ambulatory Visit: Payer: Self-pay | Admitting: Physician Assistant

## 2022-09-20 MED FILL — Clopidogrel Bisulfate Tab 75 MG (Base Equiv): ORAL | 30 days supply | Qty: 30 | Fill #2 | Status: AC

## 2022-09-20 MED FILL — Rosuvastatin Calcium Tab 20 MG: ORAL | 90 days supply | Qty: 90 | Fill #1 | Status: AC

## 2022-09-21 ENCOUNTER — Other Ambulatory Visit: Payer: Self-pay

## 2022-09-21 MED ORDER — LEFLUNOMIDE 20 MG PO TABS
ORAL_TABLET | Freq: Every day | ORAL | 0 refills | Status: DC
  Filled 2022-09-21: qty 90, 90d supply, fill #0

## 2022-09-21 NOTE — Telephone Encounter (Signed)
Next Visit: 12/10/2022  Last Visit: 07/19/2022  Last Fill: 06/20/2022  DX: Rheumatoid arthritis of multiple sites with negative rheumatoid factor   Current Dose per office note 07/19/2022: Arava 20 mg 1 tablet by mouth daily  Labs: 07/19/2022 White cell count is low due to immune suppression.  Hemoglobin is low and stable.  Glucose is elevated.  Okay to refill Arava?

## 2022-09-27 ENCOUNTER — Ambulatory Visit: Payer: 59 | Admitting: Dermatology

## 2022-10-04 ENCOUNTER — Ambulatory Visit: Payer: 59 | Admitting: Physician Assistant

## 2022-10-04 ENCOUNTER — Other Ambulatory Visit: Payer: Self-pay

## 2022-10-04 ENCOUNTER — Ambulatory Visit (INDEPENDENT_AMBULATORY_CARE_PROVIDER_SITE_OTHER): Payer: 59

## 2022-10-04 ENCOUNTER — Encounter: Payer: Self-pay | Admitting: Physician Assistant

## 2022-10-04 DIAGNOSIS — M545 Low back pain, unspecified: Secondary | ICD-10-CM | POA: Diagnosis not present

## 2022-10-04 DIAGNOSIS — M25551 Pain in right hip: Secondary | ICD-10-CM

## 2022-10-04 MED ORDER — METHOCARBAMOL 500 MG PO TABS
500.0000 mg | ORAL_TABLET | Freq: Every day | ORAL | 1 refills | Status: DC
  Filled 2022-10-04: qty 30, 30d supply, fill #0
  Filled 2022-11-02: qty 30, 30d supply, fill #1

## 2022-10-04 NOTE — Addendum Note (Signed)
Addended by: Robyne Peers on: 10/04/2022 04:25 PM   Modules accepted: Orders

## 2022-10-04 NOTE — Progress Notes (Signed)
Office Visit Note   Patient: [name]           Date of Birth: [birth year]           MRN: [id number] Visit Date: 10/04/2022              Requested by: Michela Pitcher, NP [address],  [place] [address] PCP: Michela Pitcher, NP   Assessment & Plan: Visit Diagnoses:  1. Pain in right hip   2. Acute right-sided low back pain without sciatica     Plan:  We will send her to formal physical therapy for back and for her trochanteric bursitis.  They will work on IT band stretching on the right, core strengthening, modalities, stretching and home exercise program  Follow-Up Instructions: Return in about 6 weeks (around 11/15/2022).   Orders:  Orders Placed This Encounter  Procedures   XR HIP UNILAT W OR W/O PELVIS 2-3 VIEWS RIGHT   XR Lumbar Spine 2-3 Views   Meds ordered this encounter  Medications   methocarbamol (ROBAXIN) 500 MG tablet    Sig: Take 1 tablet (500 mg total) by mouth at bedtime.    Dispense:  30 tablet    Refill:  1      Procedures: No procedures performed   Clinical Data: No additional findings.   Subjective: Chief Complaint  Patient presents with   Right Hip - Pain   Lower Back - Pain    HPI [name] comes in today for right hip pain and also low back pain.  Saw her back in March and gave her a trochanteric injection in her right hip as she states that this helped.  She has had more hip pain since falling off the bed and also off a golf cart.  She is having pain lateral aspect of the right hip.  She denies any numbness tingling down the leg.  Denies any waking pain regards to her back.  Denies any bowel or bladder dysfunction or saddle anesthesia like symptoms.  Negative for fevers or chills.  She is on chronic Plavix however she does take ibuprofen for back and hip pain and states this is only thing that really helps.  She is diabetic and reports her hemoglobin A1c is 6.5.  Review of Systems  Constitutional:  Negative for chills and  fever.     Objective: Vital Signs: There were no vitals taken for this visit.  Physical Exam Constitutional:      Appearance: She is not ill-appearing or diaphoretic.  Pulmonary:     Effort: Pulmonary effort is normal.  Neurological:     Mental Status: She is alert and oriented to person, place, and time.  Psychiatric:        Mood and Affect: Mood normal.     Ortho Exam Bilateral hips full range of motion both hips.  Right hip discomfort with external and internal rotation.  Tenderness over the right hip trochanteric region. Lower extremities: Negative straight leg raise bilaterally.  Tight hamstrings bilaterally.  5 out of 5 strength throughout lower extremities against resistance. Specialty Comments:  No specialty comments available.  Imaging: XR Lumbar Spine 2-3 Views  Result Date: 10/04/2022 Lumbar spine 2 views: Normal lordotic curvature.  Lower thoracic upper lumbar disc space narrowing otherwise disc base well-maintained.  No spondylolisthesis.  Facet arthritic changes L4-5 L5-S1.  Arthrosclerosis aorta.  No acute fractures.  XR HIP UNILAT W OR W/O PELVIS 2-3 VIEWS RIGHT  Result Date:  10/04/2022 AP pelvis lateral view of the right hip: Bilateral hips well located.  No acute fractures or bony abnormalities.  Right hip joints well-maintained.    PMFS History: Patient Active Problem List   Diagnosis Date Noted   Premature atrial contractions 05/15/2022   PVC's (premature ventricular contractions) 05/15/2022   Low hemoglobin 01/16/2022   Right ear pain 01/09/2022   Sore throat 01/09/2022   OSA on CPAP 08/02/2021   Abnormal MRI, lumbar spine 03/20/2021   Abnormal MRI, thoracic spine 03/20/2021   Chronic mid back pain 03/17/2021   Chronic midline low back pain with left-sided sciatica 03/17/2021   Type 1 diabetes mellitus with diabetic polyneuropathy ([place]) 02/17/2021   Severe obstructive sleep apnea-hypopnea syndrome 02/17/2021   Chronic diastolic CHF (congestive  heart failure) ([place]) 12/19/2020   Chronic back pain 12/19/2020   Chronic abdominal pain 45/62/5638   Acute diastolic heart failure ([place]) 12/09/2020   Diabetic retinopathy associated with type 1 diabetes mellitus ([place]) 09/27/2020   Diabetic retinopathy ([place]) 09/27/2020   Graves' disease 09/27/2020   Chronic coronary artery disease 09/27/2020   Hypertension associated with diabetes ([place]) 09/14/2020   Obesity (BMI 30-39.9) 09/14/2020   Annual physical exam 09/14/2020   Aortic atherosclerosis ([place]) 09/14/2020   Snoring 05/12/2020   Lung nodule 03/10/2020   Colitis 03/10/2020   Type 1 diabetes mellitus with proliferative retinopathy ([place]) 12/10/2019   Abnormal thyroid function test 12/10/2019   Bruxism (teeth grinding)    Chronic migraine 10/07/2019   Contusion of left hip 07/25/2019   H/O multiple allergies 03/10/2019   Hx of anaphylaxis 03/10/2019   Cough 06/26/2018   PND (post-nasal drip) 06/26/2018   Lumbar strain, initial encounter 04/10/2018   Thoracic myofascial strain 04/10/2018   Vitamin D deficiency 07/04/2017   B12 deficiency 07/04/2017   Abnormal CT scan 06/19/2017   Gastroesophageal reflux disease 06/19/2017   Antiplatelet or antithrombotic long-term use 06/19/2017   Nasal congestion 03/15/2017   Graves disease 02/01/2017   Sleep difficulties 02/01/2017   No energy 02/01/2017   Osteopenia 02/01/2017   Bilateral hand pain 07/16/2016   Acquired trigger finger 07/16/2016   Hypertension, essential 05/08/2016   Foot pain, right 08/30/2014   Shoulder pain, right 08/30/2014   Chronic pain syndrome 01/15/2014   Multiple joint pain 01/15/2014   Lesion of lower eyelid 04/21/2013   Generalized constriction of visual field 03/31/2013   Neoplasm of uncertain behavior of skin of eyelid 03/31/2013   Posterior capsular opacification 03/31/2013   Cyst of left lower eyelid 03/30/2013   Pseudophakia of both eyes 03/30/2013   Prurigo nodularis 02/05/2013   Stasis dermatitis  02/05/2013   Psoriasis 01/21/2013   Trochanteric bursitis 11/10/2012   Achilles tendinitis 07/11/2012   Epiretinal membrane 06/17/2012   Status post cataract extraction 04/30/2012   Pes equinus, acquired 04/23/2012   Tendinitis of ankle 04/23/2012   Proliferative diabetic retinopathy of both eyes ([place]) 01/11/2012   Ongoing use of possibly toxic medication 12/05/2011   Hyperthyroidism 11/09/2010   SINUS TACHYCARDIA 11/08/2010   DERMATITIS, ALLERGIC 07/20/2010   EDEMA 07/12/2010   Dizziness 11/14/2009   ATRIAL FIBRILLATION 07/20/2009   Hx of CABG 06/07/2009   ANGINA, STABLE/EXERTIONAL 06/02/2009   Dyslipidemia, goal LDL below 70 04/26/2009   ACUT MI ANTEROLAT WALL SUBSQT EPIS CARE 04/26/2009   Chest pain 04/26/2009   DIABETIC  RETINOPATHY 04/25/2009   CARPAL TUNNEL SYNDROME, BILATERAL 04/25/2009   TRIGGER FINGER 04/25/2009   MIGRAINE W/O AURA W/INTRACT W/STATUS MIGRAINOSUS 02/19/2008   ALLERGIC RHINITIS, SEASONAL  10/16/2006   CHOLELITHIASIS 10/16/2006   Rheumatoid arthritis ([place]) 10/16/2006   Past Medical History:  Diagnosis Date   ACUT MI ANTEROLAT WALL SUBSQT EPIS CARE    Acute maxillary sinusitis    ALLERGIC RHINITIS, SEASONAL    Anemia    ARTHRITIS, RHEUMATOID    shoulders and hands Enbrel>leg swelling Dr. Estanislado Pandy   Atrial fibrillation [facility])    a. after CABG.   Back pain    Bruxism (teeth grinding)    CAD, ARTERY BYPASS GRAFT    a. DES to RCA in 2010 then LAD occlusion s/p CABG 3 06/07/2009 with LIMA to LAD, reverse SVG to D1, reverse SVG to distal RCA. b. Cath 05/08/2016 slightly hypodense region in the intermediate branch, however she had excellent flow, FFR was normal. Vein graft to PDA and the posterolateral branch is patent, patent LIMA to LAD, occluded SVG to diagonal.   CARPAL TUNNEL SYNDROME, BILATERAL    CHOLELITHIASIS    Contrast media allergy    DERMATITIS, ALLERGIC    DIABETES MELLITUS, TYPE I    on insulin pump dx'ed age 63 y.o    DIABETIC  RETINOPATHY     Hiatal hernia    HYPERLIPIDEMIA-MIXED    HYPERTHYROIDISM    Dr. Dollene Cleveland   IDA (iron deficiency anemia)    Insulin pump in place    Lymphadenopathy of head and neck    MIGRAINE W/O AURA W/INTRACT W/STATUS MIGRAINOSUS 02/19/2008   PONV (postoperative nausea and vomiting)    Psoriasis    SINUS TACHYCARDIA 11/08/2010   Sleep apnea    not using machine yet    SVT (supraventricular tachycardia)    after s/p CABG   TRIGGER FINGER    all fingers b/l hands    URI     Family History  Problem Relation Age of Onset   Depression Mother    Anxiety disorder Mother    Colon polyps Father    Diabetes Father        2   Hypertension Father    Parkinson's disease Father    Stroke Paternal Grandmother    Heart disease Other    Hypertension Other    Hyperlipidemia Other    Depression Other    Migraines Other    Heart attack Neg Hx    Colon cancer Neg Hx    Stomach cancer Neg Hx    Esophageal cancer Neg Hx     Past Surgical History:  Procedure Laterality Date   ABDOMINAL HYSTERECTOMY     endometriomas b/l; total ? cervix removed; no h/o abnormal pap   Caesarean section     CARDIAC CATHETERIZATION N/A 05/08/2016   Procedure: Left Heart Cath and Cors/Grafts Angiography;  Surgeon: Burnell Blanks, MD;  Location: [place] [facility];  Service: Cardiovascular;  Laterality: N/A;   CARPAL TUNNEL RELEASE     b/l    CARPAL TUNNEL RELEASE Left 06/06/2021   Procedure: CARPAL TUNNEL RELEASE;  Surgeon: Daryll Brod, MD;  Location: [place];  Service: Orthopedics;  Laterality: Left;  AXILLARY BLOCK   CATARACT EXTRACTION, BILATERAL     CHOLECYSTECTOMY     COLONOSCOPY WITH PROPOFOL N/A 03/25/2020   Procedure: COLONOSCOPY WITH PROPOFOL;  Surgeon: Jonathon Bellows, MD;  Location: [facility]COPY;  Service: Gastroenterology;  Laterality: N/A;   CORONARY ARTERY BYPASS GRAFT     ESOPHAGOGASTRODUODENOSCOPY (EGD) WITH PROPOFOL N/A 03/25/2020   Procedure: ESOPHAGOGASTRODUODENOSCOPY (EGD)  WITH PROPOFOL;  Surgeon: Jonathon Bellows, MD;  Location: [facility] ENDOSCOPY;  Service:  Gastroenterology;  Laterality: N/A;   EYE SURGERY     laser x 2 for retinopathy    LEFT HEART CATH AND CORS/GRAFTS ANGIOGRAPHY N/A 02/22/2020   Procedure: LEFT HEART CATH AND CORS/GRAFTS ANGIOGRAPHY;  Surgeon: Burnell Blanks, MD;  Location: [place] [facility];  Service: Cardiovascular;  Laterality: N/A;   TRIGGER FINGER RELEASE     b/l fingers all    ULNAR NERVE TRANSPOSITION Left 06/06/2021   Procedure: ULNAR NERVE DECOMPRESSION;  Surgeon: Daryll Brod, MD;  Location: [place];  Service: Orthopedics;  Laterality: Left;  AXILLARY BLOCK   VITRECTOMY     b/l    Social History   Occupational History   Not on file  Tobacco Use   Smoking status: Never    Passive exposure: Never   Smokeless tobacco: Never  Vaping Use   Vaping Use: Never used  Substance and Sexual Activity   Alcohol use: Yes    Comment: rarely 1 every 6 months   Drug use: No   Sexual activity: Not Currently    Partners: Male    Birth control/protection: None

## 2022-10-05 ENCOUNTER — Other Ambulatory Visit: Payer: Self-pay

## 2022-10-08 ENCOUNTER — Other Ambulatory Visit (HOSPITAL_COMMUNITY): Payer: Self-pay

## 2022-10-08 DIAGNOSIS — Z794 Long term (current) use of insulin: Secondary | ICD-10-CM | POA: Diagnosis not present

## 2022-10-08 DIAGNOSIS — E10319 Type 1 diabetes mellitus with unspecified diabetic retinopathy without macular edema: Secondary | ICD-10-CM | POA: Diagnosis not present

## 2022-10-09 DIAGNOSIS — Z9842 Cataract extraction status, left eye: Secondary | ICD-10-CM | POA: Diagnosis not present

## 2022-10-09 DIAGNOSIS — H40053 Ocular hypertension, bilateral: Secondary | ICD-10-CM | POA: Diagnosis not present

## 2022-10-09 DIAGNOSIS — Z9841 Cataract extraction status, right eye: Secondary | ICD-10-CM | POA: Diagnosis not present

## 2022-10-09 DIAGNOSIS — E103553 Type 1 diabetes mellitus with stable proliferative diabetic retinopathy, bilateral: Secondary | ICD-10-CM | POA: Diagnosis not present

## 2022-10-09 DIAGNOSIS — H40023 Open angle with borderline findings, high risk, bilateral: Secondary | ICD-10-CM | POA: Diagnosis not present

## 2022-10-09 DIAGNOSIS — H52213 Irregular astigmatism, bilateral: Secondary | ICD-10-CM | POA: Diagnosis not present

## 2022-10-09 DIAGNOSIS — H5212 Myopia, left eye: Secondary | ICD-10-CM | POA: Diagnosis not present

## 2022-10-09 LAB — HM DIABETES EYE EXAM

## 2022-10-10 ENCOUNTER — Other Ambulatory Visit (HOSPITAL_COMMUNITY): Payer: Self-pay

## 2022-10-11 ENCOUNTER — Other Ambulatory Visit: Payer: Self-pay | Admitting: Gastroenterology

## 2022-10-11 ENCOUNTER — Telehealth: Payer: Self-pay | Admitting: Pharmacist

## 2022-10-11 ENCOUNTER — Encounter: Payer: Self-pay | Admitting: Optometry

## 2022-10-11 MED FILL — Losartan Potassium Tab 100 MG: ORAL | 90 days supply | Qty: 90 | Fill #2 | Status: AC

## 2022-10-11 MED FILL — Clopidogrel Bisulfate Tab 75 MG (Base Equiv): ORAL | 30 days supply | Qty: 30 | Fill #3 | Status: CN

## 2022-10-11 NOTE — Telephone Encounter (Signed)
Per Rinaldo Ratel, current Rinvoq PA set to expire on 10/23/22. Submitted a Prior Authorization RENEWAL request to [facility] for RINVOQ via CoverMyMeds. Will update once we receive a response.  Key: [id number]  [name] Saliva, PharmD, MPH, BCPS, CPP Clinical Pharmacist (Rheumatology and Pulmonology)

## 2022-10-12 ENCOUNTER — Other Ambulatory Visit (HOSPITAL_COMMUNITY): Payer: Self-pay

## 2022-10-12 ENCOUNTER — Other Ambulatory Visit: Payer: Self-pay

## 2022-10-12 MED FILL — Clopidogrel Bisulfate Tab 75 MG (Base Equiv): ORAL | 30 days supply | Qty: 30 | Fill #3 | Status: CN

## 2022-10-15 ENCOUNTER — Other Ambulatory Visit: Payer: Self-pay | Admitting: Gastroenterology

## 2022-10-15 ENCOUNTER — Other Ambulatory Visit (HOSPITAL_COMMUNITY): Payer: Self-pay

## 2022-10-15 ENCOUNTER — Other Ambulatory Visit: Payer: Self-pay

## 2022-10-15 MED ORDER — PANTOPRAZOLE SODIUM 40 MG PO TBEC
40.0000 mg | DELAYED_RELEASE_TABLET | Freq: Two times a day (BID) | ORAL | 3 refills | Status: DC
  Filled 2022-10-15 – 2023-01-02 (×2): qty 180, 90d supply, fill #0
  Filled 2023-04-08: qty 180, 90d supply, fill #1
  Filled 2023-07-11: qty 180, 90d supply, fill #2
  Filled 2023-10-10: qty 180, 90d supply, fill #3

## 2022-10-15 MED FILL — Clopidogrel Bisulfate Tab 75 MG (Base Equiv): ORAL | 30 days supply | Qty: 30 | Fill #3 | Status: AC

## 2022-10-16 ENCOUNTER — Other Ambulatory Visit: Payer: Self-pay

## 2022-10-17 NOTE — Telephone Encounter (Signed)
Received notification from [facility] regarding a prior authorization for [facility]. Authorization has been APPROVED from  10/15/2022 to 10/15/2023  Patient must continue to fill through [place]: [phone]   Authorization # [id number]  [employer] updated  [name] Saliva, PharmD, MPH, BCPS, CPP Clinical Pharmacist (Rheumatology and Pulmonology)

## 2022-10-23 ENCOUNTER — Ambulatory Visit: Payer: 59 | Admitting: Nurse Practitioner

## 2022-10-23 ENCOUNTER — Encounter: Payer: Self-pay | Admitting: Nurse Practitioner

## 2022-10-23 ENCOUNTER — Other Ambulatory Visit: Payer: Self-pay

## 2022-10-23 VITALS — BP 100/56 | HR 62 | Temp 96.6°F | Resp 12 | Ht 63.0 in | Wt 168.0 lb

## 2022-10-23 DIAGNOSIS — R3915 Urgency of urination: Secondary | ICD-10-CM | POA: Insufficient documentation

## 2022-10-23 DIAGNOSIS — N12 Tubulo-interstitial nephritis, not specified as acute or chronic: Secondary | ICD-10-CM | POA: Diagnosis not present

## 2022-10-23 LAB — POC URINALSYSI DIPSTICK (AUTOMATED)
Bilirubin, UA: NEGATIVE
Blood, UA: NEGATIVE
Glucose, UA: NEGATIVE
Ketones, UA: NEGATIVE
Nitrite, UA: NEGATIVE
Protein, UA: NEGATIVE
Spec Grav, UA: 1.015 (ref 1.010–1.025)
Urobilinogen, UA: 0.2 E.U./dL
pH, UA: 5 (ref 5.0–8.0)

## 2022-10-23 MED ORDER — SULFAMETHOXAZOLE-TRIMETHOPRIM 800-160 MG PO TABS
1.0000 | ORAL_TABLET | Freq: Two times a day (BID) | ORAL | 0 refills | Status: AC
  Filled 2022-10-23: qty 14, 7d supply, fill #0

## 2022-10-23 NOTE — Assessment & Plan Note (Signed)
UA in office. 

## 2022-10-23 NOTE — Progress Notes (Signed)
Acute Office Visit  Subjective:     Patient ID: [name], female    DOB: May 08, 1966, 56 y.o.   MRN: [id number]  Chief Complaint  Patient presents with   Urinary Frequency    And urgency, back pain started at first on 10/20/22 ad got worse yesterday and now having the urination frequency and nausea.     Patient is in today for urinary complaints  States that symptoms started on 10/20/2022.  States a few days before she did not feel well  States that she is took nap yesterday.  Back, nuasea, urinary frequency, No vomiting, dysuria, no vaginal discharge   Review of Systems  Constitutional:  Positive for chills. Negative for fever.  Respiratory:  Negative for shortness of breath.   Cardiovascular:  Negative for chest pain.  Gastrointestinal:  Positive for nausea. Negative for abdominal pain and vomiting.  Genitourinary:  Positive for frequency and urgency. Negative for dysuria and hematuria.  Musculoskeletal:  Positive for back pain.        Objective:    BP (!) 100/56   Pulse 62   Temp (!) 96.6 F (35.9 C) (Temporal)   Resp 12   Ht '5\' 3"'$  (1.6 m)   Wt 168 lb (76.2 kg)   SpO2 97%   BMI 29.76 kg/m    Physical Exam Vitals and nursing note reviewed.  Constitutional:      Appearance: Normal appearance.  Cardiovascular:     Rate and Rhythm: Normal rate and regular rhythm.     Heart sounds: Normal heart sounds.  Pulmonary:     Effort: Pulmonary effort is normal.     Breath sounds: Normal breath sounds.  Abdominal:     General: Bowel sounds are normal. There is no distension.     Palpations: There is no mass.     Tenderness: There is no abdominal tenderness. There is right CVA tenderness and left CVA tenderness (R>L).     Hernia: No hernia is present.  Neurological:     Mental Status: She is alert.     Results for orders placed or performed in visit on 10/23/22  Protein / creatinine ratio, urine  Result Value Ref Range   Creatinine, Urine 18.5   POCT  Urinalysis Dipstick (Automated)  Result Value Ref Range   Color, UA yellow    Clarity, UA clear    Glucose, UA Negative Negative   Bilirubin, UA negative    Ketones, UA negative    Spec Grav, UA 1.015 1.010 - 1.025   Blood, UA negative    pH, UA 5.0 5.0 - 8.0   Protein, UA Negative Negative   Urobilinogen, UA 0.2 0.2 or 1.0 E.U./dL   Nitrite, UA negative    Leukocytes, UA Moderate (2+) (A) Negative        Assessment & Plan:   Problem List Items Addressed This Visit       Genitourinary   Pyelonephritis    Given UA and physical exam we will treat with Bactrim DS 1 tab twice daily 7 days to cover for pyelonephritis.  Patient is on immunocompromising medications.  Pending urine culture.  Follow-up if no improvement      Relevant Medications   sulfamethoxazole-trimethoprim (BACTRIM DS) 800-160 MG tablet     Other   Urinary urgency - Primary    UA in office      Relevant Orders   POCT Urinalysis Dipstick (Automated) (Completed)   Urine Culture    Meds ordered this  encounter  Medications   sulfamethoxazole-trimethoprim (BACTRIM DS) 800-160 MG tablet    Sig: Take 1 tablet by mouth 2 (two) times daily for 7 days.    Dispense:  14 tablet    Refill:  0    Order Specific Question:   Supervising Provider    Answer:   Loura Pardon A [[id number]]    Return if symptoms worsen or fail to improve, for keep appointment with me in December .  Romilda Garret, NP

## 2022-10-23 NOTE — Assessment & Plan Note (Signed)
Given UA and physical exam we will treat with Bactrim DS 1 tab twice daily 7 days to cover for pyelonephritis.  Patient is on immunocompromising medications.  Pending urine culture.  Follow-up if no improvement

## 2022-10-23 NOTE — Patient Instructions (Addendum)
Nice to see you today If you do not start improving or get worse let me know Keep your physical with me in December

## 2022-10-25 LAB — URINE CULTURE
MICRO NUMBER:: 14093379
SPECIMEN QUALITY:: ADEQUATE

## 2022-10-29 ENCOUNTER — Encounter (INDEPENDENT_AMBULATORY_CARE_PROVIDER_SITE_OTHER): Payer: Self-pay

## 2022-11-02 ENCOUNTER — Other Ambulatory Visit: Payer: Self-pay

## 2022-11-06 ENCOUNTER — Telehealth: Payer: Self-pay | Admitting: Pharmacist

## 2022-11-06 ENCOUNTER — Other Ambulatory Visit (HOSPITAL_COMMUNITY): Payer: Self-pay

## 2022-11-06 NOTE — Telephone Encounter (Signed)
Received message from [facility] staff that patient has not filled Rinvoq since September 2023. ATC patient to further inquire.  Unable to reach. Left VM requesting return call

## 2022-11-07 ENCOUNTER — Other Ambulatory Visit (HOSPITAL_COMMUNITY): Payer: Self-pay

## 2022-11-08 ENCOUNTER — Other Ambulatory Visit (HOSPITAL_COMMUNITY): Payer: Self-pay

## 2022-11-08 NOTE — Telephone Encounter (Signed)
Per dispense history, patient has filled Rinvoq on 11/08/22 and called pharmacy today. Left VM with patient advising her to reach out with any questions or concerns in future. Closing encounter.  [name] Saliva, PharmD, MPH, BCPS, CPP Clinical Pharmacist (Rheumatology and Pulmonology)

## 2022-11-09 ENCOUNTER — Other Ambulatory Visit: Payer: Self-pay

## 2022-11-15 ENCOUNTER — Other Ambulatory Visit: Payer: Self-pay

## 2022-11-15 ENCOUNTER — Ambulatory Visit: Payer: 59 | Admitting: Physician Assistant

## 2022-11-15 ENCOUNTER — Encounter: Payer: Self-pay | Admitting: Physician Assistant

## 2022-11-15 DIAGNOSIS — M25551 Pain in right hip: Secondary | ICD-10-CM

## 2022-11-15 DIAGNOSIS — M545 Low back pain, unspecified: Secondary | ICD-10-CM | POA: Diagnosis not present

## 2022-11-15 MED ORDER — CYCLOBENZAPRINE HCL 10 MG PO TABS
10.0000 mg | ORAL_TABLET | Freq: Three times a day (TID) | ORAL | 0 refills | Status: DC | PRN
Start: 2022-11-15 — End: 2022-12-17
  Filled 2022-11-15: qty 30, 10d supply, fill #0

## 2022-11-15 NOTE — Progress Notes (Signed)
HPI: [name] returns today for follow-up of her right hip and low back pain.  She states that pain really has not changed.  She continues to have low back pain and having right lateral hip pain.  No real radicular symptoms down either leg.  She unfortunately did not go to therapy.  She is asking for the Robaxin to be stopped in order to go back on Flexeril as she has found this more beneficial in the past.  Review of systems: See HPI otherwise negative or noncontributory.  Physical exam: General well-developed well-nourished female no acute distress. Bilateral hips: Good range of motion of both hips without pain.  Tenderness over the right hip trochanteric region.  Negative straight leg raise bilaterally.  Tight hamstrings on the right.  Impression: Low back pain without radicular symptoms Right hip trochanteric bursitis  Plan we will send in Flexeril.  We will also set her up for physical therapy here as there was some miscommunication getting her set up elsewhere.  We will see her back in 4 to 6 weeks if her pain persist.  Questions were encouraged and answered at length.

## 2022-11-17 MED FILL — Clopidogrel Bisulfate Tab 75 MG (Base Equiv): ORAL | 30 days supply | Qty: 30 | Fill #4 | Status: AC

## 2022-11-18 ENCOUNTER — Other Ambulatory Visit (HOSPITAL_COMMUNITY): Payer: Self-pay

## 2022-11-19 ENCOUNTER — Other Ambulatory Visit (HOSPITAL_COMMUNITY): Payer: Self-pay

## 2022-11-26 ENCOUNTER — Encounter: Payer: Self-pay | Admitting: Physical Therapy

## 2022-11-26 ENCOUNTER — Ambulatory Visit: Payer: 59 | Attending: Physician Assistant | Admitting: Physical Therapy

## 2022-11-26 DIAGNOSIS — M25551 Pain in right hip: Secondary | ICD-10-CM | POA: Insufficient documentation

## 2022-11-26 DIAGNOSIS — M545 Low back pain, unspecified: Secondary | ICD-10-CM | POA: Insufficient documentation

## 2022-11-26 DIAGNOSIS — M5459 Other low back pain: Secondary | ICD-10-CM | POA: Insufficient documentation

## 2022-11-26 DIAGNOSIS — M6281 Muscle weakness (generalized): Secondary | ICD-10-CM | POA: Insufficient documentation

## 2022-11-26 NOTE — Therapy (Unsigned)
OUTPATIENT PHYSICAL THERAPY THORACOLUMBAR EVALUATION  Patient Name: [name] MRN: [id number] DOB:Feb 25, 1966, 56 y.o., female Today's Date: 11/26/2022  END OF SESSION:  PT End of Session - 11/26/22 1743     Visit Number 1    Number of Visits 16    Date for PT Re-Evaluation 01/21/23    Authorization Type 1/10 - 11/26/2022 initial eval    PT Start Time 1545    PT Stop Time 1645    PT Time Calculation (min) 60 min    Activity Tolerance Patient limited by pain    Behavior During Therapy WFL for tasks assessed/performed            Past Medical History:  Diagnosis Date   ACUT MI ANTEROLAT WALL SUBSQT EPIS CARE    Acute maxillary sinusitis    ALLERGIC RHINITIS, SEASONAL    Anemia    ARTHRITIS, RHEUMATOID    shoulders and hands Enbrel>leg swelling Dr. Estanislado Pandy   Atrial fibrillation [facility])    a. after CABG.   Back pain    Bruxism (teeth grinding)    CAD, ARTERY BYPASS GRAFT    a. DES to RCA in 2010 then LAD occlusion s/p CABG 3 06/07/2009 with LIMA to LAD, reverse SVG to D1, reverse SVG to distal RCA. b. Cath 05/08/2016 slightly hypodense region in the intermediate branch, however she had excellent flow, FFR was normal. Vein graft to PDA and the posterolateral branch is patent, patent LIMA to LAD, occluded SVG to diagonal.   CARPAL TUNNEL SYNDROME, BILATERAL    CHOLELITHIASIS    Contrast media allergy    DERMATITIS, ALLERGIC    DIABETES MELLITUS, TYPE I    on insulin pump dx'ed age 46 y.o    DIABETIC  RETINOPATHY    Hiatal hernia    HYPERLIPIDEMIA-MIXED    HYPERTHYROIDISM    Dr. Dollene [place]   IDA (iron deficiency anemia)    Insulin pump in place    Lymphadenopathy of head and neck    MIGRAINE W/O AURA W/INTRACT W/STATUS MIGRAINOSUS 02/19/2008   PONV (postoperative nausea and vomiting)    Psoriasis    SINUS TACHYCARDIA 11/08/2010   Sleep apnea    not using machine yet    SVT (supraventricular tachycardia)    after s/p CABG   TRIGGER FINGER    all fingers b/l  hands    URI    Past Surgical History:  Procedure Laterality Date   ABDOMINAL HYSTERECTOMY     endometriomas b/l; total ? cervix removed; no h/o abnormal pap   Caesarean section     CARDIAC CATHETERIZATION N/A 05/08/2016   Procedure: Left Heart Cath and Cors/Grafts Angiography;  Surgeon: Burnell Blanks, MD;  Location: [place] [facility];  Service: Cardiovascular;  Laterality: N/A;   CARPAL TUNNEL RELEASE     b/l    CARPAL TUNNEL RELEASE Left 06/06/2021   Procedure: CARPAL TUNNEL RELEASE;  Surgeon: Daryll Brod, MD;  Location: [place];  Service: Orthopedics;  Laterality: Left;  AXILLARY BLOCK   CATARACT EXTRACTION, BILATERAL     CHOLECYSTECTOMY     COLONOSCOPY WITH PROPOFOL N/A 03/25/2020   Procedure: COLONOSCOPY WITH PROPOFOL;  Surgeon: Jonathon Bellows, MD;  Location: [facility] ENDOSCOPY;  Service: Gastroenterology;  Laterality: N/A;   CORONARY ARTERY BYPASS GRAFT     ESOPHAGOGASTRODUODENOSCOPY (EGD) WITH PROPOFOL N/A 03/25/2020   Procedure: ESOPHAGOGASTRODUODENOSCOPY (EGD) WITH PROPOFOL;  Surgeon: Jonathon Bellows, MD;  Location: [facility] ENDOSCOPY;  Service: Gastroenterology;  Laterality: N/A;   EYE SURGERY  laser x 2 for retinopathy    LEFT HEART CATH AND CORS/GRAFTS ANGIOGRAPHY N/A 02/22/2020   Procedure: LEFT HEART CATH AND CORS/GRAFTS ANGIOGRAPHY;  Surgeon: Burnell Blanks, MD;  Location: [place] [facility];  Service: Cardiovascular;  Laterality: N/A;   TRIGGER FINGER RELEASE     b/l fingers all    ULNAR NERVE TRANSPOSITION Left 06/06/2021   Procedure: ULNAR NERVE DECOMPRESSION;  Surgeon: Daryll Brod, MD;  Location: [place];  Service: Orthopedics;  Laterality: Left;  AXILLARY BLOCK   VITRECTOMY     b/l    Patient Active Problem List   Diagnosis Date Noted   Urinary urgency 10/23/2022   Pyelonephritis 10/23/2022   Premature atrial contractions 05/15/2022   PVC's (premature ventricular contractions) 05/15/2022   Low hemoglobin 01/16/2022   Right  ear pain 01/09/2022   Sore throat 01/09/2022   OSA on CPAP 08/02/2021   Abnormal MRI, lumbar spine 03/20/2021   Abnormal MRI, thoracic spine 03/20/2021   Chronic mid back pain 03/17/2021   Chronic midline low back pain with left-sided sciatica 03/17/2021   Type 1 diabetes mellitus with diabetic polyneuropathy ([place]) 02/17/2021   Severe obstructive sleep apnea-hypopnea syndrome 02/17/2021   Chronic diastolic CHF (congestive heart failure) ([place]) 12/19/2020   Chronic back pain 12/19/2020   Chronic abdominal pain 74/94/4967   Acute diastolic heart failure ([place]) 12/09/2020   Diabetic retinopathy associated with type 1 diabetes mellitus ([place]) 09/27/2020   Diabetic retinopathy ([place]) 09/27/2020   Graves' disease 09/27/2020   Chronic coronary artery disease 09/27/2020   Hypertension associated with diabetes ([place]) 09/14/2020   Obesity (BMI 30-39.9) 09/14/2020   Annual physical exam 09/14/2020   Aortic atherosclerosis ([place]) 09/14/2020   Snoring 05/12/2020   Lung nodule 03/10/2020   Colitis 03/10/2020   Type 1 diabetes mellitus with proliferative retinopathy ([place]) 12/10/2019   Abnormal thyroid function test 12/10/2019   Bruxism (teeth grinding)    Chronic migraine 10/07/2019   Contusion of left hip 07/25/2019   H/O multiple allergies 03/10/2019   Hx of anaphylaxis 03/10/2019   Cough 06/26/2018   PND (post-nasal drip) 06/26/2018   Lumbar strain, initial encounter 04/10/2018   Thoracic myofascial strain 04/10/2018   Vitamin D deficiency 07/04/2017   B12 deficiency 07/04/2017   Abnormal CT scan 06/19/2017   Gastroesophageal reflux disease 06/19/2017   Antiplatelet or antithrombotic long-term use 06/19/2017   Nasal congestion 03/15/2017   Graves disease 02/01/2017   Sleep difficulties 02/01/2017   No energy 02/01/2017   Osteopenia 02/01/2017   Bilateral hand pain 07/16/2016   Acquired trigger finger 07/16/2016   Hypertension, essential 05/08/2016   Foot pain, right 08/30/2014    Shoulder pain, right 08/30/2014   Chronic pain syndrome 01/15/2014   Multiple joint pain 01/15/2014   Lesion of lower eyelid 04/21/2013   Generalized constriction of visual field 03/31/2013   Neoplasm of uncertain behavior of skin of eyelid 03/31/2013   Posterior capsular opacification 03/31/2013   Cyst of left lower eyelid 03/30/2013   Pseudophakia of both eyes 03/30/2013   Prurigo nodularis 02/05/2013   Stasis dermatitis 02/05/2013   Psoriasis 01/21/2013   Trochanteric bursitis 11/10/2012   Achilles tendinitis 07/11/2012   Epiretinal membrane 06/17/2012   Status post cataract extraction 04/30/2012   Pes equinus, acquired 04/23/2012   Tendinitis of ankle 04/23/2012   Proliferative diabetic retinopathy of both eyes ([place]) 01/11/2012   Ongoing use of possibly toxic medication 12/05/2011   Hyperthyroidism 11/09/2010   SINUS TACHYCARDIA 11/08/2010   DERMATITIS, ALLERGIC 07/20/2010  EDEMA 07/12/2010   Dizziness 11/14/2009   ATRIAL FIBRILLATION 07/20/2009   Hx of CABG 06/07/2009   ANGINA, STABLE/EXERTIONAL 06/02/2009   Dyslipidemia, goal LDL below 70 04/26/2009   ACUT MI ANTEROLAT WALL SUBSQT EPIS CARE 04/26/2009   Chest pain 04/26/2009   DIABETIC  RETINOPATHY 04/25/2009   CARPAL TUNNEL SYNDROME, BILATERAL 04/25/2009   TRIGGER FINGER 04/25/2009   MIGRAINE W/O AURA W/INTRACT W/STATUS MIGRAINOSUS 02/19/2008   ALLERGIC RHINITIS, SEASONAL 10/16/2006   CHOLELITHIASIS 10/16/2006   Rheumatoid arthritis ([place]) 10/16/2006    PCP: Karl Ito  REFERRING PROVIDER: Erskine Emery  REFERRING DIAG:  M25.551 (ICD-10-CM) - Pain in right hip  M54.50 (ICD-10-CM) - Acute right-sided low back pain without sciatica   Rationale for Evaluation and Treatment: Rehabilitation  THERAPY DIAG:  Muscle weakness (generalized)  Other low back pain  Pain in right hip  ONSET DATE: 3 months ago  SUBJECTIVE:                                                                                                                                                                                            SUBJECTIVE STATEMENT: Patient reports they have been experiencing pain in their (R) hip and low back. Patient reports experiencing (R) hip pain since the early summer which started insidiously at first and got worse when they fell on their (R) hip when they were riding a golf cart at a wedding. Patient reports their low back pain began 3 months ago insidiously. Patient has had low back issues before but is experiencing increased back pain now. Patient received a steroid injection in the summer in her (R) hip but reports it didn't help decrease her pain levels. Patient reports stiffness and soreness the (R) hip and low back when waking up, both get worse toward end of the day as they do more activity, and reports she often experiences sharp pain in her back that can wake her up at night.   PERTINENT HISTORY:  Patient reports they have been experiencing pain in their (R) hip and low back. Patient reports experiencing (R) hip pain since the early summer which started insidiously at first and got worse when they fell on their (R) hip when they were riding a golf cart at a wedding. Patient reports their low back pain began 3 months ago insidiously. Patient has had low back issues before but is experiencing increased back pain now. 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 Patient reports stiffness and soreness  the (R) hip and low back when waking up, both get worse toward end of the day as they do more activity, and reports she often experiences sharp pain in her back that can wake her up at night. Patient is a 57 year old female with a PMH of acute MI, Anemia, RA, Afib, LBP, CAD, CTS, DM type 1, Hiatal hernia, HLD, hyperthyroidism, sinus tachycardia, hysterectomy,  C-section sx, CT release.    PAIN:   Back Pain:  Are you having pain? Yes: NPRS scale: 5/10 Pain location: (R) low back Pain description: sore, achy pain Aggravating factors: pushing and pulling  Relieving factors: heat, ibuprofen  Back pain:7/10 worst, 3/10 at best.  *Patient reported taking ibuprofen for pain even though she knows she is not supposed to on blood thinners*  (R) Hip Pain Are you having pain? Yes: NPRS scale: 5/10 Pain location: Greater trochanter Pain description: sore and sometimes sharp Aggravating factors: driving and sitting Relieving factors: Ibuprofen   Hip pain: 8/10 worst, 5/10 at best.  PRECAUTIONS: None  WEIGHT BEARING RESTRICTIONS: No  FALLS:  Has patient fallen in last 6 months? Yes. Number of falls 1 *Golf cart fall*  LIVING ENVIRONMENT: Lives with: lives with their family and lives with their daughters Lives in: House/apartment Stairs: Yes: Internal: 14 steps; none and External: 1 steps; none Has following equipment at home: None  OCCUPATION: Nurse  PLOF: Independent  PATIENT GOALS:   1) Feel better, decrease pain levels  2) Get back to cardio dancing   NEXT MD VISIT: N/A  OBJECTIVE:   DIAGNOSTIC FINDINGS:   Lumbar spine 2 views: Normal lordotic curvature.  Lower thoracic upper  lumbar disc space narrowing otherwise disc base well-maintained.  No  spondylolisthesis.  Facet arthritic changes L4-5 L5-S1.  Arthrosclerosis  aorta.  No acute fractures.   AP pelvis lateral view of the right hip: Bilateral hips well located.  No  acute fractures or bony abnormalities. Right hip joints well-maintained.   PATIENT SURVEYS:  FOTO 57% Oswestry : To perform next session  SCREENING FOR RED FLAGS: Bowel or bladder incontinence: No Spinal tumors: No Cauda equina syndrome: No Compression fracture: No Abdominal aneurysm: No  COGNITION: Overall cognitive status: Within functional limits for tasks assessed     SENSATION: Not  tested  MUSCLE LENGTH: Hamstrings: Right 55* deg; Left 83 deg  POSTURE: rounded shoulders and forward head  PALPATION: TTP on (R) low back and lumbar paraspinals. Lumbar spine hypomobile and sore with CPAs to lumbar spine.   LUMBAR ROM:   AROM eval  Flexion WFL, finger tips to AJL  Extension 10 deg  Right lateral flexion 15  Left lateral flexion 18  Right rotation 27  Left rotation 45   (Blank rows = not tested)  Pain with OP with every lumbar ROM, worse pain on the right side  LOWER EXTREMITY ROM:     Active  Right eval Left eval  Hip flexion 95* sharp 105  Hip extension    Hip abduction 25* sharp 35  Hip adduction WFL* pain at ER [facility]  Hip internal rotation 18* 20  Hip external rotation 23* 25   (Blank rows = not tested) *pain  LOWER EXTREMITY MMT:    MMT Right eval Left eval  Hip flexion 4/5* 5/5  Hip extension 3/5* (unable to assess further due to pain)  5/5  Hip abduction 4/5 5/5  Hip adduction 5/5 5/5  Hip internal rotation 4/5* (4/10 pain) 5/5  Hip external rotation 4/5* (6/10  pain) 5/5   (Blank rows = not tested)  Core strength: lowering legs to table in supine causes pain in back and (R) hip   LUMBAR SPECIAL TESTS:  Straight leg raise test: Unable to assess due to pain, no reports of N/T however   Seated Piriformis Test: (+) on right side   FUNCTIONAL TESTS:  30 seconds chair stand test: 9 STS  GAIT: Not tested today   TODAY'S TREATMENT:                                                                                                                              DATE: 11/26/2022   Not treatment today, initial evaluation    PATIENT EDUCATION:  Education details: body mechanics, pain management, POC Person educated: Patient Education method: Explanation and Demonstration Education comprehension: verbalized understanding  HOME EXERCISE PROGRAM:  Create next and demonstrate next session.   ASSESSMENT:  CLINICAL IMPRESSION:  Patient is  a 56 y.o. female who was seen today for physical therapy evaluation and treatment for low back pain and pain in her right hip. The patient first experienced insidious (R) hip pain in the early summer and then fell on their (R) hip aggravating it even more. Patient reports they are also experiencing low back pain which began 3 months ago insidiously. Patient presents with decreased AROM of their right hip and decreased strength limited by pain in multiple motions. The patient also presents with decreased lumbar AROM and pain with OP in all directions.The patient presents with a positive seated piriformis test on right side. The patient presents with concordant signs and symptoms of greater trochanteric pain syndrome and mechanical low back pain. Patient's condition has the potential to improve in response to therapy and will benefit from skilled therapy to address mobility and strength deficits.The anticipated improvement is attainable and reasonable in a generally predictable time.   OBJECTIVE IMPAIRMENTS: decreased activity tolerance, decreased balance, decreased endurance, decreased mobility, difficulty walking, decreased ROM, decreased strength, impaired flexibility, improper body mechanics, and pain.   ACTIVITY LIMITATIONS: carrying, lifting, bending, sitting, standing, squatting, sleeping, and stairs  PARTICIPATION LIMITATIONS: cleaning, driving, shopping, community activity, and occupation  PERSONAL FACTORS: Age and 3+ comorbidities:    Patient is a 56 year old female with a PMH of acute MI, Anemia, RA, Afib, LBP, CAD, CTS, DM type 1, Hiatal hernia, HLD, hyperthyroidism, sinus tachycardia, hysterectomy, C-section sx, CT release are also affecting patient's functional outcome.   REHAB POTENTIAL: Good  CLINICAL DECISION MAKING: Stable/uncomplicated  EVALUATION COMPLEXITY: Low   GOALS: Goals reviewed with patient? Yes  SHORT TERM GOALS: Target date: 12/24/2022  Patient will be independent in  home exercise program to improve strength/mobility for better functional independence with ADLs. Baseline: 11/26/2022: Give patient HEP next session Goal status: INITIAL  LONG TERM GOALS: Target date: 01/21/2023  Patient will increase FOTO score to equal to or greater than   66% to demonstrate statistically significant improvement in mobility and  quality of life.  Baseline: 11/26/2022: 57% Goal status: INITIAL  2.   Patient will complete 14 sit to stands in < 30 seconds indicating an increased LE strength and improved balance without pain increase. Baseline: 11/26/2022: 9 STS in 30 sec Goal status: INITIAL  3.  Patient will improve (R) hamstring length to > 75 deg without pain to increase functional capability of her (R) hip and overall mobility.  Baseline: 11/26/2022: Hamstrings: Right 55* deg Goal status: INITIAL  4.  Patient will report a worst pain of 3/10 on VAS to improve tolerance with ADLs and reduced symptoms with activities.  Baseline: 11/26/2022: 5/10 resting pain in LB and (R) hip Goal status: INITIAL  5.  Patient will reduce ODI score as to demonstrate minimal disability with ADLs including improved sleeping tolerance, walking/sitting tolerance etc for better mobility with ADLs.  Baseline: 11/26/2022: Patient started ODI but didn't complete back page. Perform next session Goal status: INITIAL  PLAN:  PT FREQUENCY: 2x/week  PT DURATION: 8 weeks  PLANNED INTERVENTIONS: Therapeutic exercises, Therapeutic activity, Neuromuscular re-education, Balance training, Gait training, Patient/Family education, Self Care, Joint mobilization, Joint manipulation, Stair training, Vestibular training, Canalith repositioning, DME instructions, Aquatic Therapy, Dry Needling, Electrical stimulation, Spinal manipulation, Spinal mobilization, Cryotherapy, Moist heat, scar mobilization, Taping, Traction, Ultrasound, Manual therapy, and Re-evaluation.  PLAN FOR NEXT SESSION: Create HEP.  Demonstrate HEP and make sure patient has good understanding of it. Have patient finish ODI.    Sudie Bailey, Student-PT 11/26/2022, 5:47 PM

## 2022-11-26 NOTE — Therapy (Unsigned)
OUTPATIENT PHYSICAL THERAPY THORACOLUMBAR EVALUATION   Patient Name: [name] MRN: [id number] DOB:Feb 18, 1966, 56 y.o., female Today's Date: 11/26/2022  END OF SESSION:   Past Medical History:  Diagnosis Date   ACUT MI ANTEROLAT WALL SUBSQT EPIS CARE    Acute maxillary sinusitis    ALLERGIC RHINITIS, SEASONAL    Anemia    ARTHRITIS, RHEUMATOID    shoulders and hands Enbrel>leg swelling Dr. Estanislado Pandy   Atrial fibrillation [facility])    a. after CABG.   Back pain    Bruxism (teeth grinding)    CAD, ARTERY BYPASS GRAFT    a. DES to RCA in 2010 then LAD occlusion s/p CABG 3 06/07/2009 with LIMA to LAD, reverse SVG to D1, reverse SVG to distal RCA. b. Cath 05/08/2016 slightly hypodense region in the intermediate branch, however she had excellent flow, FFR was normal. Vein graft to PDA and the posterolateral branch is patent, patent LIMA to LAD, occluded SVG to diagonal.   CARPAL TUNNEL SYNDROME, BILATERAL    CHOLELITHIASIS    Contrast media allergy    DERMATITIS, ALLERGIC    DIABETES MELLITUS, TYPE I    on insulin pump dx'ed age 66 y.o    DIABETIC  RETINOPATHY    Hiatal hernia    HYPERLIPIDEMIA-MIXED    HYPERTHYROIDISM    Dr. Dollene Cleveland   IDA (iron deficiency anemia)    Insulin pump in place    Lymphadenopathy of head and neck    MIGRAINE W/O AURA W/INTRACT W/STATUS MIGRAINOSUS 02/19/2008   PONV (postoperative nausea and vomiting)    Psoriasis    SINUS TACHYCARDIA 11/08/2010   Sleep apnea    not using machine yet    SVT (supraventricular tachycardia)    after s/p CABG   TRIGGER FINGER    all fingers b/l hands    URI    Past Surgical History:  Procedure Laterality Date   ABDOMINAL HYSTERECTOMY     endometriomas b/l; total ? cervix removed; no h/o abnormal pap   Caesarean section     CARDIAC CATHETERIZATION N/A 05/08/2016   Procedure: Left Heart Cath and Cors/Grafts Angiography;  Surgeon: Burnell Blanks, MD;  Location: [place] [facility];  Service:  Cardiovascular;  Laterality: N/A;   CARPAL TUNNEL RELEASE     b/l    CARPAL TUNNEL RELEASE Left 06/06/2021   Procedure: CARPAL TUNNEL RELEASE;  Surgeon: Daryll Brod, MD;  Location: [place];  Service: Orthopedics;  Laterality: Left;  AXILLARY BLOCK   CATARACT EXTRACTION, BILATERAL     CHOLECYSTECTOMY     COLONOSCOPY WITH PROPOFOL N/A 03/25/2020   Procedure: COLONOSCOPY WITH PROPOFOL;  Surgeon: Jonathon Bellows, MD;  Location: [facility]COPY;  Service: Gastroenterology;  Laterality: N/A;   CORONARY ARTERY BYPASS GRAFT     ESOPHAGOGASTRODUODENOSCOPY (EGD) WITH PROPOFOL N/A 03/25/2020   Procedure: ESOPHAGOGASTRODUODENOSCOPY (EGD) WITH PROPOFOL;  Surgeon: Jonathon Bellows, MD;  Location: [facility] ENDOSCOPY;  Service: Gastroenterology;  Laterality: N/A;   EYE SURGERY     laser x 2 for retinopathy    LEFT HEART CATH AND CORS/GRAFTS ANGIOGRAPHY N/A 02/22/2020   Procedure: LEFT HEART CATH AND CORS/GRAFTS ANGIOGRAPHY;  Surgeon: Burnell Blanks, MD;  Location: [place] [facility];  Service: Cardiovascular;  Laterality: N/A;   TRIGGER FINGER RELEASE     b/l fingers all    ULNAR NERVE TRANSPOSITION Left 06/06/2021   Procedure: ULNAR NERVE DECOMPRESSION;  Surgeon: Daryll Brod, MD;  Location: [place];  Service: Orthopedics;  Laterality: Left;  AXILLARY BLOCK   VITRECTOMY     b/l    Patient Active Problem List   Diagnosis Date Noted   Urinary urgency 10/23/2022   Pyelonephritis 10/23/2022   Premature atrial contractions 05/15/2022   PVC's (premature ventricular contractions) 05/15/2022   Low hemoglobin 01/16/2022   Right ear pain 01/09/2022   Sore throat 01/09/2022   OSA on CPAP 08/02/2021   Abnormal MRI, lumbar spine 03/20/2021   Abnormal MRI, thoracic spine 03/20/2021   Chronic mid back pain 03/17/2021   Chronic midline low back pain with left-sided sciatica 03/17/2021   Type 1 diabetes mellitus with diabetic polyneuropathy ([place]) 02/17/2021   Severe obstructive sleep  apnea-hypopnea syndrome 02/17/2021   Chronic diastolic CHF (congestive heart failure) ([place]) 12/19/2020   Chronic back pain 12/19/2020   Chronic abdominal pain 48/88/9169   Acute diastolic heart failure ([place]) 12/09/2020   Diabetic retinopathy associated with type 1 diabetes mellitus ([place]) 09/27/2020   Diabetic retinopathy ([place]) 09/27/2020   Graves' disease 09/27/2020   Chronic coronary artery disease 09/27/2020   Hypertension associated with diabetes ([place]) 09/14/2020   Obesity (BMI 30-39.9) 09/14/2020   Annual physical exam 09/14/2020   Aortic atherosclerosis ([place]) 09/14/2020   Snoring 05/12/2020   Lung nodule 03/10/2020   Colitis 03/10/2020   Type 1 diabetes mellitus with proliferative retinopathy ([place]) 12/10/2019   Abnormal thyroid function test 12/10/2019   Bruxism (teeth grinding)    Chronic migraine 10/07/2019   Contusion of left hip 07/25/2019   H/O multiple allergies 03/10/2019   Hx of anaphylaxis 03/10/2019   Cough 06/26/2018   PND (post-nasal drip) 06/26/2018   Lumbar strain, initial encounter 04/10/2018   Thoracic myofascial strain 04/10/2018   Vitamin D deficiency 07/04/2017   B12 deficiency 07/04/2017   Abnormal CT scan 06/19/2017   Gastroesophageal reflux disease 06/19/2017   Antiplatelet or antithrombotic long-term use 06/19/2017   Nasal congestion 03/15/2017   Graves disease 02/01/2017   Sleep difficulties 02/01/2017   No energy 02/01/2017   Osteopenia 02/01/2017   Bilateral hand pain 07/16/2016   Acquired trigger finger 07/16/2016   Hypertension, essential 05/08/2016   Foot pain, right 08/30/2014   Shoulder pain, right 08/30/2014   Chronic pain syndrome 01/15/2014   Multiple joint pain 01/15/2014   Lesion of lower eyelid 04/21/2013   Generalized constriction of visual field 03/31/2013   Neoplasm of uncertain behavior of skin of eyelid 03/31/2013   Posterior capsular opacification 03/31/2013   Cyst of left lower eyelid 03/30/2013   Pseudophakia of both  eyes 03/30/2013   Prurigo nodularis 02/05/2013   Stasis dermatitis 02/05/2013   Psoriasis 01/21/2013   Trochanteric bursitis 11/10/2012   Achilles tendinitis 07/11/2012   Epiretinal membrane 06/17/2012   Status post cataract extraction 04/30/2012   Pes equinus, acquired 04/23/2012   Tendinitis of ankle 04/23/2012   Proliferative diabetic retinopathy of both eyes ([place]) 01/11/2012   Ongoing use of possibly toxic medication 12/05/2011   Hyperthyroidism 11/09/2010   SINUS TACHYCARDIA 11/08/2010   DERMATITIS, ALLERGIC 07/20/2010   EDEMA 07/12/2010   Dizziness 11/14/2009   ATRIAL FIBRILLATION 07/20/2009   Hx of CABG 06/07/2009   ANGINA, STABLE/EXERTIONAL 06/02/2009   Dyslipidemia, goal LDL below 70 04/26/2009   ACUT MI ANTEROLAT WALL SUBSQT EPIS CARE 04/26/2009   Chest pain 04/26/2009   DIABETIC  RETINOPATHY 04/25/2009   CARPAL TUNNEL SYNDROME, BILATERAL 04/25/2009   TRIGGER FINGER 04/25/2009   MIGRAINE W/O AURA W/INTRACT W/STATUS MIGRAINOSUS 02/19/2008   ALLERGIC RHINITIS, SEASONAL 10/16/2006   CHOLELITHIASIS 10/16/2006   Rheumatoid  arthritis ([place]) 10/16/2006    PCP: Michela Pitcher, NP   REFERRING PROVIDER: Pete Pelt, PA-C   REFERRING DIAG:  [phone] (ICD-10-CM) - Pain in right hip  M54.50 (ICD-10-CM) - Acute right-sided low back pain without sciatica    Rationale for Evaluation and Treatment: Rehabilitation  THERAPY DIAG:  No diagnosis found.  ONSET DATE: ***  SUBJECTIVE:                                                                                                                                                                                           SUBJECTIVE STATEMENT: ***  PERTINENT HISTORY: From orthopedic visit 1/16 with Erskine Emery, PA-C  [place] returns today for follow-up of her right hip and low back pain.  She states that pain really has not changed.  She continues to have low back pain and having right lateral hip pain.  No real radicular  symptoms down either leg.  She unfortunately did not go to therapy.  She is asking for the Robaxin to be stopped in order to go back on Flexeril as she has found this more beneficial in the past.  Bilateral hips: Good range of motion of both hips without pain.  Tenderness over the right hip trochanteric region.  Negative straight leg raise bilaterally.  Tight hamstrings on the right.  We will send her to formal physical therapy for back and for her trochanteric bursitis.  They will work on IT band stretching on the right, core strengthening, modalities, stretching and home exercise program   PAIN:  Are you having pain? {OPRCPAIN:27236}  PRECAUTIONS: {Therapy precautions:24002}  WEIGHT BEARING RESTRICTIONS: {Yes ***/No:24003}  FALLS:  Has patient fallen in last 6 months? {fallsyesno:27318}  LIVING ENVIRONMENT: Lives with: {OPRC lives with:25569::"lives with their family"} Lives in: {Lives in:25570} Stairs: {opstairs:27293} Has following equipment at home: {Assistive devices:23999}  OCCUPATION: ***  PLOF: {PLOF:24004}  PATIENT GOALS: ***  NEXT MD VISIT:   OBJECTIVE:   DIAGNOSTIC FINDINGS:  ***  PATIENT SURVEYS:  {rehab surveys:24030}  SCREENING FOR RED FLAGS: Bowel or bladder incontinence: {Yes/No:304960894} Spinal tumors: {Yes/No:304960894} Cauda equina syndrome: {Yes/No:304960894} Compression fracture: {Yes/No:304960894} Abdominal aneurysm: {Yes/No:304960894}  COGNITION: Overall cognitive status: {cognition:24006}     SENSATION: {sensation:27233}  MUSCLE LENGTH: Hamstrings: Right *** deg; Left *** deg Thomas test: Right *** deg; Left *** deg  POSTURE: {posture:25561}  PALPATION: ***  LUMBAR ROM:   AROM eval  Flexion   Extension   Right lateral flexion   Left lateral flexion   Right rotation   Left rotation    (Blank rows = not tested)  LOWER EXTREMITY ROM:     {  AROM/PROM:27142}  Right eval Left eval  Hip flexion    Hip extension    Hip  abduction    Hip adduction    Hip internal rotation    Hip external rotation    Knee flexion    Knee extension    Ankle dorsiflexion    Ankle plantarflexion    Ankle inversion    Ankle eversion     (Blank rows = not tested)  LOWER EXTREMITY MMT:    MMT Right eval Left eval  Hip flexion    Hip extension    Hip abduction    Hip adduction    Hip internal rotation    Hip external rotation    Knee flexion    Knee extension    Ankle dorsiflexion    Ankle plantarflexion    Ankle inversion    Ankle eversion     (Blank rows = not tested)  LUMBAR SPECIAL TESTS:  {lumbar special test:25242}  FUNCTIONAL TESTS:  {Functional tests:24029}  GAIT: Distance walked: *** Assistive device utilized: {Assistive devices:23999} Level of assistance: {Levels of assistance:24026} Comments: ***  TODAY'S TREATMENT:                                                                                                                              DATE: ***    PATIENT EDUCATION:  Education details: *** Person educated: {Person educated:25204} Education method: {Education Method:25205} Education comprehension: {Education Comprehension:25206}  HOME EXERCISE PROGRAM: ***  ASSESSMENT:  CLINICAL IMPRESSION: Patient is a *** y.o. *** who was seen today for physical therapy evaluation and treatment for ***.   OBJECTIVE IMPAIRMENTS: {opptimpairments:25111}.   ACTIVITY LIMITATIONS: {activitylimitations:27494}  PARTICIPATION LIMITATIONS: {participationrestrictions:25113}  PERSONAL FACTORS: {Personal factors:25162} are also affecting patient's functional outcome.   REHAB POTENTIAL: {rehabpotential:25112}  CLINICAL DECISION MAKING: {clinical decision making:25114}  EVALUATION COMPLEXITY: {Evaluation complexity:25115}   GOALS: Goals reviewed with patient? {yes/no:20286}  SHORT TERM GOALS: Target date: ***  *** Baseline: Goal status: {GOALSTATUS:25110}  2.  *** Baseline:  Goal  status: {GOALSTATUS:25110}  3.  *** Baseline:  Goal status: {GOALSTATUS:25110}  4.  *** Baseline:  Goal status: {GOALSTATUS:25110}  5.  *** Baseline:  Goal status: {GOALSTATUS:25110}  6.  *** Baseline:  Goal status: {GOALSTATUS:25110}  LONG TERM GOALS: Target date: ***  *** Baseline:  Goal status: {GOALSTATUS:25110}  2.  *** Baseline:  Goal status: {GOALSTATUS:25110}  3.  *** Baseline:  Goal status: {GOALSTATUS:25110}  4.  *** Baseline:  Goal status: {GOALSTATUS:25110}  5.  *** Baseline:  Goal status: {GOALSTATUS:25110}  6.  *** Baseline:  Goal status: {GOALSTATUS:25110}  PLAN:  PT FREQUENCY: {rehab frequency:25116}  PT DURATION: {rehab duration:25117}  PLANNED INTERVENTIONS: {rehab planned interventions:25118::"Therapeutic exercises","Therapeutic activity","Neuromuscular re-education","Balance training","Gait training","Patient/Family education","Self Care","Joint mobilization"}.  PLAN FOR NEXT SESSION: ***   Particia Lather, PT 11/26/2022, 9:56 AM

## 2022-11-27 NOTE — Progress Notes (Signed)
Office Visit Note  Patient: [name]             Date of Birth: [birth year]           MRN: [id number]             PCP: Michela Pitcher, NP Referring: Orland Mustard * Visit Date: 12/10/2022 Occupation: _0 @  Subjective:  Increased pain   History of Present Illness: [name] is a 56 y.o. female with history of seronegative rheumatoid arthritis.  She is currently taking Rinvoq 15 mg 1 tablet by mouth once daily and Arava 20 mg 1 tablet by mouth daily.  She is tolerating combination therapy without any side effects.  She has not missed any doses of arava or Rinvoq recently.  She denies any recent or recurrent infections.  She states for the past 2-week she has been experiencing generalized pain involving multiple joints.  She states that her quality of life has been suffering due to the severity of pain she experiences.  She is having pain in her lower back, both hips, both hands, and both feet.  She is not sure if her increased arthralgias and joint stiffness are due to weather changes, her work schedule, or due to her medications no longer working.  She has been taking ibuprofen for pain relief which she is not supposed to do due to history of colitis. Patient reports that she had the annual flu shot and is up-to-date with the Shingrix and pneumonia vaccine.  She has not yet had the RSV vaccine but plans on reaching out to her PCP.    Activities of Daily Living:  Patient reports morning stiffness for all day. Patient Reports nocturnal pain.  Difficulty dressing/grooming: Denies Difficulty climbing stairs: Reports Difficulty getting out of chair: Denies Difficulty using hands for taps, buttons, cutlery, and/or writing: Reports  Review of Systems  Constitutional:  Positive for fatigue.  HENT:  Negative for mouth sores, mouth dryness and nose dryness.   Eyes:  Positive for photophobia and dryness. Negative for pain and visual disturbance.  Respiratory:  Negative for  cough, hemoptysis, shortness of breath and difficulty breathing.   Cardiovascular:  Positive for swelling in legs/feet. Negative for chest pain, palpitations and hypertension.  Gastrointestinal:  Negative for blood in stool, constipation and diarrhea.  Endocrine: Negative for increased urination.  Genitourinary:  Negative for painful urination.  Musculoskeletal:  Positive for joint pain, joint pain, joint swelling, myalgias, muscle weakness, morning stiffness, muscle tenderness and myalgias.  Skin:  Negative for color change, pallor, rash, hair loss, nodules/bumps, skin tightness, ulcers and sensitivity to sunlight.  Allergic/Immunologic: Negative for susceptible to infections.  Neurological:  Positive for headaches. Negative for dizziness, numbness and weakness.  Hematological:  Negative for swollen glands.  Psychiatric/Behavioral:  Positive for sleep disturbance. Negative for depressed mood. The patient is not nervous/anxious.     PMFS History:  Patient Active Problem List   Diagnosis Date Noted   Left foot pain 12/05/2022   Urinary urgency 10/23/2022   Pyelonephritis 10/23/2022   Premature atrial contractions 05/15/2022   PVC's (premature ventricular contractions) 05/15/2022   Low hemoglobin 01/16/2022   Right ear pain 01/09/2022   Sore throat 01/09/2022   OSA on CPAP 08/02/2021   Abnormal MRI, lumbar spine 03/20/2021   Abnormal MRI, thoracic spine 03/20/2021   Chronic mid back pain 03/17/2021   Chronic midline low back pain with left-sided sciatica 03/17/2021   Type 1 diabetes mellitus with diabetic polyneuropathy ([place]) 02/17/2021  Severe obstructive sleep apnea-hypopnea syndrome 02/17/2021   Chronic diastolic CHF (congestive heart failure) ([place]) 12/19/2020   Chronic back pain 12/19/2020   Chronic abdominal pain 82/80/0349   Acute diastolic heart failure ([place]) 12/09/2020   Diabetic retinopathy associated with type 1 diabetes mellitus ([place]) 09/27/2020   Diabetic retinopathy  ([place]) 09/27/2020   Graves' disease 09/27/2020   Chronic coronary artery disease 09/27/2020   Hypertension associated with diabetes ([place]) 09/14/2020   Obesity (BMI 30-39.9) 09/14/2020   Preventative health care 09/14/2020   Aortic atherosclerosis ([place]) 09/14/2020   Snoring 05/12/2020   Lung nodule 03/10/2020   Colitis 03/10/2020   Type 1 diabetes mellitus with proliferative retinopathy ([place]) 12/10/2019   Abnormal thyroid function test 12/10/2019   Bruxism (teeth grinding)    Chronic migraine 10/07/2019   Contusion of left hip 07/25/2019   H/O multiple allergies 03/10/2019   Hx of anaphylaxis 03/10/2019   Cough 06/26/2018   PND (post-nasal drip) 06/26/2018   Lumbar strain, initial encounter 04/10/2018   Thoracic myofascial strain 04/10/2018   Vitamin D deficiency 07/04/2017   B12 deficiency 07/04/2017   Abnormal CT scan 06/19/2017   Gastroesophageal reflux disease 06/19/2017   Antiplatelet or antithrombotic long-term use 06/19/2017   Nasal congestion 03/15/2017   Graves disease 02/01/2017   Sleep difficulties 02/01/2017   No energy 02/01/2017   Osteopenia 02/01/2017   Bilateral hand pain 07/16/2016   Acquired trigger finger 07/16/2016   Hypertension, essential 05/08/2016   Foot pain, right 08/30/2014   Shoulder pain, right 08/30/2014   Chronic pain syndrome 01/15/2014   Multiple joint pain 01/15/2014   Lesion of lower eyelid 04/21/2013   Generalized constriction of visual field 03/31/2013   Neoplasm of uncertain behavior of skin of eyelid 03/31/2013   Posterior capsular opacification 03/31/2013   Cyst of left lower eyelid 03/30/2013   Pseudophakia of both eyes 03/30/2013   Prurigo nodularis 02/05/2013   Stasis dermatitis 02/05/2013   Psoriasis 01/21/2013   Trochanteric bursitis 11/10/2012   Achilles tendinitis 07/11/2012   Epiretinal membrane 06/17/2012   Status post cataract extraction 04/30/2012   Pes equinus, acquired 04/23/2012   Tendinitis of ankle 04/23/2012    Proliferative diabetic retinopathy of both eyes ([place]) 01/11/2012   Ongoing use of possibly toxic medication 12/05/2011   Hyperthyroidism 11/09/2010   SINUS TACHYCARDIA 11/08/2010   DERMATITIS, ALLERGIC 07/20/2010   EDEMA 07/12/2010   Dizziness 11/14/2009   ATRIAL FIBRILLATION 07/20/2009   Hx of CABG 06/07/2009   ANGINA, STABLE/EXERTIONAL 06/02/2009   Dyslipidemia, goal LDL below 70 04/26/2009   ACUT MI ANTEROLAT WALL SUBSQT EPIS CARE 04/26/2009   Chest pain 04/26/2009   DIABETIC  RETINOPATHY 04/25/2009   CARPAL TUNNEL SYNDROME, BILATERAL 04/25/2009   TRIGGER FINGER 04/25/2009   MIGRAINE W/O AURA W/INTRACT W/STATUS MIGRAINOSUS 02/19/2008   ALLERGIC RHINITIS, SEASONAL 10/16/2006   CHOLELITHIASIS 10/16/2006   Rheumatoid arthritis ([place]) 10/16/2006    Past Medical History:  Diagnosis Date   ACUT MI ANTEROLAT WALL SUBSQT EPIS CARE    Acute maxillary sinusitis    ALLERGIC RHINITIS, SEASONAL    Anemia    ARTHRITIS, RHEUMATOID    shoulders and hands Enbrel>leg swelling Dr. Estanislado Pandy   Atrial fibrillation [facility])    a. after CABG.   Back pain    Bruxism (teeth grinding)    CAD, ARTERY BYPASS GRAFT    a. DES to RCA in 2010 then LAD occlusion s/p CABG 3 06/07/2009 with LIMA to LAD, reverse SVG to D1, reverse SVG to distal RCA. b. Cath  05/08/2016 slightly hypodense region in the intermediate branch, however she had excellent flow, FFR was normal. Vein graft to PDA and the posterolateral branch is patent, patent LIMA to LAD, occluded SVG to diagonal.   CARPAL TUNNEL SYNDROME, BILATERAL    CHOLELITHIASIS    Contrast media allergy    DERMATITIS, ALLERGIC    DIABETES MELLITUS, TYPE I    on insulin pump dx'ed age 46 y.o    DIABETIC  RETINOPATHY    Hiatal hernia    HYPERLIPIDEMIA-MIXED    HYPERTHYROIDISM    Dr. Dollene Cleveland   IDA (iron deficiency anemia)    Insulin pump in place    Lymphadenopathy of head and neck    MIGRAINE W/O AURA W/INTRACT W/STATUS MIGRAINOSUS 02/19/2008   PONV  (postoperative nausea and vomiting)    Psoriasis    SINUS TACHYCARDIA 11/08/2010   Sleep apnea    not using machine yet    SVT (supraventricular tachycardia)    after s/p CABG   TRIGGER FINGER    all fingers b/l hands    URI     Family History  Problem Relation Age of Onset   Depression Mother    Anxiety disorder Mother    Colon polyps Father    Diabetes Father        2   Hypertension Father    Parkinson's disease Father    Stroke Paternal Grandmother    Heart disease Other    Hypertension Other    Hyperlipidemia Other    Depression Other    Migraines Other    Heart attack Neg Hx    Colon cancer Neg Hx    Stomach cancer Neg Hx    Esophageal cancer Neg Hx    Past Surgical History:  Procedure Laterality Date   ABDOMINAL HYSTERECTOMY     endometriomas b/l; total ? cervix removed; no h/o abnormal pap   Caesarean section     CARDIAC CATHETERIZATION N/A 05/08/2016   Procedure: Left Heart Cath and Cors/Grafts Angiography;  Surgeon: Burnell Blanks, MD;  Location: [facility];  Service: Cardiovascular;  Laterality: N/A;   CARPAL TUNNEL RELEASE     b/l    CARPAL TUNNEL RELEASE Left 06/06/2021   Procedure: CARPAL TUNNEL RELEASE;  Surgeon: Daryll Brod, MD;  Location: [place];  Service: Orthopedics;  Laterality: Left;  AXILLARY BLOCK   CATARACT EXTRACTION, BILATERAL     CHOLECYSTECTOMY     COLONOSCOPY WITH PROPOFOL N/A 03/25/2020   Procedure: COLONOSCOPY WITH PROPOFOL;  Surgeon: Jonathon Bellows, MD;  Location: [facility] ENDOSCOPY;  Service: Gastroenterology;  Laterality: N/A;   CORONARY ARTERY BYPASS GRAFT     ESOPHAGOGASTRODUODENOSCOPY (EGD) WITH PROPOFOL N/A 03/25/2020   Procedure: ESOPHAGOGASTRODUODENOSCOPY (EGD) WITH PROPOFOL;  Surgeon: Jonathon Bellows, MD;  Location: [facility] ENDOSCOPY;  Service: Gastroenterology;  Laterality: N/A;   EYE SURGERY     laser x 2 for retinopathy    LEFT HEART CATH AND CORS/GRAFTS ANGIOGRAPHY N/A 02/22/2020   Procedure: LEFT HEART CATH  AND CORS/GRAFTS ANGIOGRAPHY;  Surgeon: Burnell Blanks, MD;  Location: [facility];  Service: Cardiovascular;  Laterality: N/A;   TRIGGER FINGER RELEASE     b/l fingers all    ULNAR NERVE TRANSPOSITION Left 06/06/2021   Procedure: ULNAR NERVE DECOMPRESSION;  Surgeon: Daryll Brod, MD;  Location: [facility];  Service: Orthopedics;  Laterality: Left;  AXILLARY BLOCK   VITRECTOMY     b/l    Social History   Social History Narrative  Divorced. Has 2 kids(fraternal twins) daughters age 81. Works at  [employer], Never smoked, denies ETOH, no drugs. Drinks diet coke. No exercise.       DPR daughters [name] and Darlisa Spruiell twins          Immunization History  Administered Date(s) Administered   Influenza Inj Mdck Quad With Preservative 09/30/2014   Influenza, Quadrivalent, Recombinant, Inj, Pf 10/14/2013   Influenza, Seasonal, Injecte, Preservative Fre 09/19/2016   Influenza,inj,Quad PF,6+ Mos 09/30/2014, 09/23/2017, 09/03/2018, 10/02/2022   Influenza,inj,quad, With Preservative 09/23/2017   Influenza-Unspecified 09/30/2014, 09/30/2016, 09/03/2018, 09/10/2019, 09/28/2020, 09/28/2021   PFIZER(Purple Top)SARS-COV-2 Vaccination 12/28/2019, 01/18/2020   Pneumococcal Conjugate-13 07/16/2016   Pneumococcal Polysaccharide-23 01/01/2004, 10/15/2006, 01/04/2020   Td 10/01/2003   Tdap 01/09/2011, 06/25/2018   Zoster Recombinat (Shingrix) 09/14/2020, 12/15/2020     Objective: Vital Signs: BP 130/80 (BP Location: Left Arm, Patient Position: Sitting, Cuff Size: Normal)   Pulse 71   Resp 15   Ht 5' 3" (1.6 m)   Wt 170 lb 3.2 oz (77.2 kg)   BMI 30.15 kg/m    Physical Exam Vitals and nursing note reviewed.  Constitutional:      Appearance: She is well-developed.  HENT:     Head: Normocephalic and atraumatic.  Eyes:     Conjunctiva/sclera: Conjunctivae normal.  Cardiovascular:     Rate and Rhythm: Normal rate and regular rhythm.     Heart sounds: Normal  heart sounds.  Pulmonary:     Effort: Pulmonary effort is normal.     Breath sounds: Normal breath sounds.  Abdominal:     General: Bowel sounds are normal.     Palpations: Abdomen is soft.  Musculoskeletal:     Cervical back: Normal range of motion.  Skin:    General: Skin is warm and dry.     Capillary Refill: Capillary refill takes less than 2 seconds.  Neurological:     Mental Status: She is alert and oriented to person, place, and time.  Psychiatric:        Behavior: Behavior normal.      Musculoskeletal Exam: Generalized hyperalgesia.  C-spine has limited range of motion with discomfort.  Painful range of motion of the lumbar spine.  Shoulder joints have painful range of motion bilaterally.  Bilateral elbow joint contractures.  Wrist joints, MCPs, PIPs, DIPs have good range of motion with no synovitis.  PIP and DIP thickening consistent with osteoarthritis of both hands.  Complete fist formation bilaterally.  Painful range of motion of both hip joints.  Knee joints have good range of motion with no warmth or effusion.  Ankle joints have good range of motion.  Tenderness over the dorsal aspect of the left foot.  She has tenderness over the MTP joints.  PIP and DIP thickening consistent with osteoarthritis of both feet.  CDAI Exam: CDAI Score: -- Patient Global: 5 mm; Provider Global: 5 mm Swollen: --; Tender: -- Joint Exam 12/10/2022   No joint exam has been documented for this visit   There is currently no information documented on the homunculus. Go to the Rheumatology activity and complete the homunculus joint exam.  Investigation: No additional findings.  Imaging: DG Foot Complete Left  Result Date: 12/07/2022 CLINICAL DATA:  5th metatarsal pain EXAM: LEFT FOOT - COMPLETE 3+ VIEW COMPARISON:  07/14/2020 FINDINGS: Diffuse osteopenia. Normal alignment. No fracture or other acute bone abnormality. Small calcaneal spur. No significant osseous degenerative change. Scattered  arterial calcifications. IMPRESSION: No acute findings. Electronically Signed  By: Lucrezia Europe M.D.   On: 12/07/2022 11:33    Recent Labs: Lab Results  Component Value Date   WBC 4.0 12/05/2022   HGB 11.7 (L) 12/05/2022   PLT 231.0 12/05/2022   NA 138 12/05/2022   K 3.7 12/05/2022   CL 100 12/05/2022   CO2 30 12/05/2022   GLUCOSE 172 (H) 12/05/2022   BUN 13 12/05/2022   CREATININE 0.92 12/05/2022   BILITOT 0.3 12/05/2022   ALKPHOS 66 12/05/2022   AST 21 12/05/2022   ALT 15 12/05/2022   PROT 6.4 12/05/2022   ALBUMIN 4.5 12/05/2022   CALCIUM 8.7 12/05/2022   GFRAA 86 04/07/2021   QFTBGOLDPLUS NEGATIVE 10/31/2021    Speciality Comments: PLQ Eye Exam: 01/06/2020 ABNORMAL @ [facility]. Patient advised to discontinue PLQ at office visit on 01/06/2020.  Prior Therapy: methotrexate/Xeljanz/Actemra (GI upset), Enbrel (injection site reaction) and Orencia/Remicade/Humira/Cimzia/Rituxan/Simponi/Simponi Aria (inadequate response).  Procedures:  No procedures performed Allergies: Actemra [tocilizumab], Ramipril, Shellfish-derived products, Atorvastatin, Compazine  [prochlorperazine edisylate], Emgality [galcanezumab-gnlm], Etanercept, Infliximab, Iohexol, Orencia [abatacept], Prochlorperazine edisylate, Tofacitinib, Trokendi xr [topiramate er], Tramadol, Amiodarone, and Rituximab    Assessment / Plan:     Visit Diagnoses: Rheumatoid arthritis of multiple sites with negative rheumatoid factor ([place]) - Methotrexate/Xeljanz/Actemra (GI upset), Enbrel (injection site reaction) and Orencia/Remicade/Humira/Cimzia/Rituxan/Simponi/Simponi Aria (inadequate response):  Patient presents today with increased generalized pain involving multiple joints.  She feels that she has been having a flare for the past 2 weeks and is unsure if it is due to weather changes, activity level at work, or if her medications are no longer adequately controlling her rheumatoid arthritis.  She remains on  Rinvoq 15 mg 1 tablet daily and Arava 20 mg 1 tablet daily.  She has not missed any doses of these medications recently.  She has noticed some swelling in the left foot intermittently.  She had x-rays of the left foot on 12/05/2022 which did not reveal any acute abnormalities.  She is also been having increased pain in both hands and her left wrist joint.  She is not having to take ibuprofen more frequently for symptomatic relief.  ESR and CRP will be checked today for further evaluation.  If her inflammatory markers are high I recommend scheduling an ultrasound to assess for synovitis.  She was in agreement.  Referral to pain management will also be placed today.  In the meantime she will remain on Rinvoq and Arava as prescribed.  She will follow up in 3 months or sooner if needed.  - Plan: Sedimentation rate, C-reactive protein, Ambulatory referral to Physical Medicine Rehab  High risk medication use - Rinvoq 15 mg 1 tablet by mouth once daily and Arava 20 mg 1 tablet by mouth daily. CBC and CMP updated on 12/05/22. Her next lab work will be due in March and every 3 months. Standing orders for CBC and CMP remain in place.  Patient had updated TB gold at work and will have results sent to [place].  No recent or recurrent infections.  Discussed the importance of holding Rinvoq and arava if she develops signs or symptoms of an infection and to resume once the infection has completely cleared.   She received the annual flu shot.  She is up-to-date with the Shingrix and pneumonia vaccines.  She is planning on getting the RSV vaccine.  Psoriasis: No active psoriasis at this time.   Contracture of joint of both elbows: Unchanged.  Tenderness of both elbows, right > left.  Trochanteric bursitis, right hip: Tenderness over the right trochanteric bursa.  She is currently under the care of physical therapy.  Chronic pain of right knee: Good range of motion of the right knee joint on examination today.  No warmth or  effusion noted.  DDD (degenerative disc disease), thoracic - Chronic pain.  Referral to pain management was placed today.  Plan: Ambulatory referral to Physical Medicine Rehab  DDD (degenerative disc disease), lumbar - She had a MRI of the lumbar spine on 04/02/21: Mild canal stenosis at L3-L4 and L4-L5, slightly progressed.  She had updated x-rays of the lumbar spine on 10/04/2022.  Chronic pain.  She is currently going to physical therapy but has not had any improvement in her symptoms thus far.  She has been using a heating pad and taking ibuprofen as needed for pain relief.  A referral to Dr. Dagoberto Ligas at pain management was placed today.  - Plan: Ambulatory referral to Physical Medicine Rehab  Osteopenia of multiple sites - Most recent DEXA on 11/11/19: The BMD measured at Femur Neck Left is 0.901 g/cm2 with a T-score of -1.0-Normal.   Due to update DEXA in November 2025.   Piriformis syndrome of right side: Intermittent discomfort. Currently going to PT.   Other fatigue: Chronic.   Other insomnia: She experiences nocturnal pain which contributes to difficulty sleeping at night.   History of vitamin D deficiency  Other medical conditions are listed as follows:  History of gastroesophageal reflux (GERD)  History of Graves' disease  History of coronary artery disease  History of diabetes mellitus  History of hyperlipidemia  History of hypertension: Blood pressure was 130/80 today in the office.  History of migraine  Abnormal SPEP - She had an abnormal SPEP in 2020.  Orders: Orders Placed This Encounter  Procedures   Sedimentation rate   C-reactive protein   Ambulatory referral to Physical Medicine Rehab   No orders of the defined types were placed in this encounter.     Follow-Up Instructions: Return in about 3 months (around 03/11/2023) for Rheumatoid arthritis, Osteoarthritis.   Ofilia Neas, PA-C  Note - This record has been created using Dragon software.  Chart  creation errors have been sought, but may not always  have been located. Such creation errors do not reflect on  the standard of medical care.

## 2022-11-30 ENCOUNTER — Other Ambulatory Visit: Payer: Self-pay | Admitting: Rheumatology

## 2022-11-30 ENCOUNTER — Other Ambulatory Visit: Payer: Self-pay

## 2022-11-30 ENCOUNTER — Other Ambulatory Visit (HOSPITAL_COMMUNITY): Payer: Self-pay

## 2022-11-30 DIAGNOSIS — Z79899 Other long term (current) drug therapy: Secondary | ICD-10-CM

## 2022-11-30 DIAGNOSIS — Z111 Encounter for screening for respiratory tuberculosis: Secondary | ICD-10-CM

## 2022-11-30 DIAGNOSIS — M0609 Rheumatoid arthritis without rheumatoid factor, multiple sites: Secondary | ICD-10-CM

## 2022-11-30 NOTE — Telephone Encounter (Signed)
Next Visit: 12/10/2022  Last Visit: 07/19/2022  Last Fill: 07/30/2022  [id number] arthritis of multiple sites with negative rheumatoid factor   Current Dose per office note on 07/19/2022: Rinvoq 15 mg 1 tablet by mouth once daily   Labs: 07/19/2022 White cell count is low due to immune suppression.  Hemoglobin is low and stable.  Glucose is elevated.  09/05/2022 CBC: HCT 35.2  TB Gold: 10/31/2021 negative    Advised patient she is due to update labs, patient verbalized understanding and will update on Monday when she sees her PCP. Patient will have TB gold results sent to [place] from [facility].   Okay to refill rinvoq?

## 2022-12-03 ENCOUNTER — Other Ambulatory Visit (HOSPITAL_COMMUNITY): Payer: Self-pay

## 2022-12-03 ENCOUNTER — Other Ambulatory Visit: Payer: Self-pay | Admitting: Nurse Practitioner

## 2022-12-03 DIAGNOSIS — Z1231 Encounter for screening mammogram for malignant neoplasm of breast: Secondary | ICD-10-CM

## 2022-12-03 MED ORDER — RINVOQ 15 MG PO TB24
15.0000 mg | ORAL_TABLET | Freq: Every day | ORAL | 2 refills | Status: DC
  Filled 2022-12-03: qty 30, 30d supply, fill #0
  Filled 2022-12-28: qty 30, 30d supply, fill #1
  Filled 2023-01-25: qty 30, 30d supply, fill #2

## 2022-12-04 ENCOUNTER — Other Ambulatory Visit (HOSPITAL_COMMUNITY): Payer: Self-pay

## 2022-12-04 DIAGNOSIS — E10319 Type 1 diabetes mellitus with unspecified diabetic retinopathy without macular edema: Secondary | ICD-10-CM | POA: Diagnosis not present

## 2022-12-05 ENCOUNTER — Encounter: Payer: Self-pay | Admitting: Nurse Practitioner

## 2022-12-05 ENCOUNTER — Ambulatory Visit (INDEPENDENT_AMBULATORY_CARE_PROVIDER_SITE_OTHER): Payer: 59 | Admitting: Nurse Practitioner

## 2022-12-05 ENCOUNTER — Ambulatory Visit (INDEPENDENT_AMBULATORY_CARE_PROVIDER_SITE_OTHER)
Admission: RE | Admit: 2022-12-05 | Discharge: 2022-12-05 | Disposition: A | Discharge status: Home / Self Care | Payer: 59 | Source: Ambulatory Visit | Attending: Nurse Practitioner | Admitting: Nurse Practitioner

## 2022-12-05 VITALS — BP 122/68 | HR 92 | Temp 97.2°F | Ht 63.0 in | Wt 170.0 lb

## 2022-12-05 DIAGNOSIS — E0590 Thyrotoxicosis, unspecified without thyrotoxic crisis or storm: Secondary | ICD-10-CM | POA: Diagnosis not present

## 2022-12-05 DIAGNOSIS — Z0000 Encounter for general adult medical examination without abnormal findings: Secondary | ICD-10-CM

## 2022-12-05 DIAGNOSIS — E1159 Type 2 diabetes mellitus with other circulatory complications: Secondary | ICD-10-CM | POA: Diagnosis not present

## 2022-12-05 DIAGNOSIS — E559 Vitamin D deficiency, unspecified: Secondary | ICD-10-CM | POA: Diagnosis not present

## 2022-12-05 DIAGNOSIS — M79672 Pain in left foot: Secondary | ICD-10-CM

## 2022-12-05 DIAGNOSIS — I2510 Atherosclerotic heart disease of native coronary artery without angina pectoris: Secondary | ICD-10-CM | POA: Diagnosis not present

## 2022-12-05 DIAGNOSIS — I10 Essential (primary) hypertension: Secondary | ICD-10-CM | POA: Diagnosis not present

## 2022-12-05 DIAGNOSIS — G4733 Obstructive sleep apnea (adult) (pediatric): Secondary | ICD-10-CM | POA: Diagnosis not present

## 2022-12-05 DIAGNOSIS — I152 Hypertension secondary to endocrine disorders: Secondary | ICD-10-CM

## 2022-12-05 DIAGNOSIS — M7732 Calcaneal spur, left foot: Secondary | ICD-10-CM | POA: Diagnosis not present

## 2022-12-05 NOTE — Assessment & Plan Note (Signed)
Discussed age-appropriate musicians.  Patient is up-to-date on mammogram, Pap smear, colonoscopy.  Patient up-to-date on vaccinations that she qualifies for.  Patient was given information at discharge in regards to preventative healthcare maintenance for her age range with anticipatory guidance.

## 2022-12-05 NOTE — Assessment & Plan Note (Signed)
Not here as of late.  Patient is followed by [facility] for sleep medicine.

## 2022-12-05 NOTE — Progress Notes (Signed)
Established Patient Office Visit  Subjective   Patient ID: [name], female    DOB: 1966/06/25  Age: 56 y.o. MRN: [id number]  Chief Complaint  Patient presents with   Annual Exam    HPI  for complete physical and follow up of chronic conditions.  DM1: Patient currently followed by Dr. Steffanie Dunn, endocrine allergy.  Patient is on insulin pump along with Ozempic to help with weight loss.   CAD: Patient is followed by Dr. Burt Knack.  Patient currently on antiplatelet therapy with clopidogrel.  Also manages her Imdur  RA:.  Patient is followed by Dr. Vonna Drafts, rheumatology.  Patient is managed on written Juluis Rainier and [place]  Constipation: Chronic constipation has been seen by GI currently on Linzess  CPAP: Has a CPAP but not wearing it as of late. [facility] is the neurology group that follows her sleep medicine  Immunizations: -Tetanus:2019 -Influenza: up to date -Shingles:12/15/2020, 09/14/2020 -Pneumonia:  updated Covid: pfizer x2   -HPV: aged out  Diet: [place].  2 meals with a snack. Diet  Coke very little  water.  Exercise: No regular exercise. States that she in in PT  [place] exam: Completes annually 10/09/2022 Dr Zigmund Daniel for retina yearly  Dental exam: Completes semi-annually   Pap Smear:  hysterectomy Mammogram: due 12/2022  Colonoscopy: Completed in 03/25/2020, repeat 5 years Lung Cancer Screening: NA  Dexa: Too young  Sleep: states that she goes to bed around [id number] and gets up around 520. Does not feel rested. Does not snore       Review of Systems  Constitutional:  Negative for chills and fever.  Respiratory:  Negative for shortness of breath.   Cardiovascular:  Positive for leg swelling. Negative for chest pain.  Gastrointestinal:  Positive for constipation and diarrhea. Negative for abdominal pain, nausea and vomiting.       BM daily sometimes   Genitourinary:  Negative for dysuria and hematuria.  Neurological:  Negative for tingling and headaches.   Psychiatric/Behavioral:  Negative for hallucinations and suicidal ideas.       Objective:     BP 122/68 (BP Location: Left Arm, Patient Position: Sitting, Cuff Size: Normal)   Pulse 92   Temp (!) 97.2 F (36.2 C) (Temporal)   Ht '5\' 3"'$  (1.6 m)   Wt 170 lb (77.1 kg)   SpO2 97%   BMI 30.11 kg/m    Physical Exam Vitals and nursing note reviewed.  Constitutional:      Appearance: Normal appearance.  HENT:     Right Ear: Tympanic membrane, ear canal and external ear normal.     Left Ear: Tympanic membrane, ear canal and external ear normal.     Mouth/Throat:     Mouth: Mucous membranes are moist.     Pharynx: Oropharynx is clear.  Eyes:     Extraocular Movements: Extraocular movements intact.     Pupils: Pupils are equal, round, and reactive to light.  Cardiovascular:     Rate and Rhythm: Normal rate and regular rhythm.     Heart sounds: Normal heart sounds.  Pulmonary:     Effort: Pulmonary effort is normal.     Breath sounds: Normal breath sounds.  Abdominal:     General: Bowel sounds are normal. There is no distension.     Palpations: There is no mass.     Tenderness: There is no abdominal tenderness.     Hernia: No hernia is present.  Musculoskeletal:     Right lower leg: No  edema.     Left lower leg: No edema.       Feet:  Feet:     Comments: Some mild tenderness to palpation.  No erythema edema or ecchymosis appreciated. Lymphadenopathy:     Cervical: No cervical adenopathy.  Skin:    General: Skin is warm.  Neurological:     General: No focal deficit present.     Mental Status: She is alert.  Psychiatric:        Mood and Affect: Mood normal.        Behavior: Behavior normal.        Thought Content: Thought content normal.        Judgment: Judgment normal.      No results found for any visits on 12/05/22.    The ASCVD Risk score (Arnett DK, et al., 2019) failed to calculate for the following reasons:   The patient has a prior MI or stroke  diagnosis    Assessment & Plan:   Problem List Items Addressed This Visit       Cardiovascular and Mediastinum   Hypertension, essential    Patient currently followed and managed by cardiology.  Patient is currently on Imdur and torsemide.      Hypertension associated with diabetes ([place])   Chronic coronary artery disease    Patient is followed by Dr. Sherren Mocha.  She has had a CABG in the past and is on clopidogrel      Relevant Orders   Lipid panel     Respiratory   OSA on CPAP    Not here as of late.  Patient is followed by [facility] for sleep medicine.        Endocrine   Hyperthyroidism    Patient is followed by Dr. Quentin Ore endocrinology.  For hyperthyroidism and Graves' disease.  Patient requested testing TSH T3-T4      Relevant Orders   T3, free   T4, free   TSH     Other   Vitamin D deficiency    Pending vitamin D level today      Relevant Orders   VITAMIN D 25 Hydroxy (Vit-D Deficiency, Fractures)   Preventative health care - Primary    Discussed age-appropriate musicians.  Patient is up-to-date on mammogram, Pap smear, colonoscopy.  Patient up-to-date on vaccinations that she qualifies for.  Patient was given information at discharge in regards to preventative healthcare maintenance for her age range with anticipatory guidance.      Relevant Orders   CBC   Comprehensive metabolic panel   TSH   Lipid panel   Left foot pain    Pending x-ray of left foot.      Relevant Orders   DG Foot Complete Left    Return in about 6 months (around 06/06/2023) for recheck on chronic conditions.    Romilda Garret, NP

## 2022-12-05 NOTE — Assessment & Plan Note (Signed)
Patient currently followed and managed by cardiology.  Patient is currently on Imdur and torsemide.

## 2022-12-05 NOTE — Patient Instructions (Signed)
Nice to see you today I will be in touch with the labs Follow up 6 months, sooner if you need me

## 2022-12-05 NOTE — Assessment & Plan Note (Signed)
Pending x-ray of left foot.

## 2022-12-05 NOTE — Assessment & Plan Note (Signed)
Patient is followed by Dr. Quentin Ore endocrinology.  For hyperthyroidism and Graves' disease.  Patient requested testing TSH T3-T4

## 2022-12-05 NOTE — Assessment & Plan Note (Signed)
Patient is followed by Dr. Sherren Mocha.  She has had a CABG in the past and is on clopidogrel

## 2022-12-05 NOTE — Assessment & Plan Note (Signed)
Pending vitamin D level today

## 2022-12-06 LAB — TSH: TSH: 1.02 u[IU]/mL (ref 0.35–5.50)

## 2022-12-06 LAB — LIPID PANEL
Cholesterol: 133 mg/dL (ref 0–200)
HDL: 62.4 mg/dL (ref >39.00)
LDL Cholesterol: 52 mg/dL (ref 0–99)
NonHDL: 70.73
Total CHOL/HDL Ratio: 2
Triglycerides: 95 mg/dL (ref 0.0–149.0)
VLDL: 19 mg/dL (ref 0.0–40.0)

## 2022-12-06 LAB — T4, FREE: Free T4: 0.83 ng/dL (ref 0.60–1.60)

## 2022-12-06 LAB — COMPREHENSIVE METABOLIC PANEL
ALT: 15 U/L (ref 0–35)
AST: 21 U/L (ref 0–37)
Albumin: 4.5 g/dL (ref 3.5–5.2)
Alkaline Phosphatase: 66 U/L (ref 39–117)
BUN: 13 mg/dL (ref 6–23)
CO2: 30 mEq/L (ref 19–32)
Calcium: 8.7 mg/dL (ref 8.4–10.5)
Chloride: 100 mEq/L (ref 96–112)
Creatinine, Ser: 0.92 mg/dL (ref 0.40–1.20)
GFR: 70.3 mL/min (ref >60.00)
Glucose, Bld: 172 mg/dL — ABNORMAL HIGH (ref 70–99)
Potassium: 3.7 mEq/L (ref 3.5–5.1)
Sodium: 138 mEq/L (ref 135–145)
Total Bilirubin: 0.3 mg/dL (ref 0.2–1.2)
Total Protein: 6.4 g/dL (ref 6.0–8.3)

## 2022-12-06 LAB — CBC
HCT: 34.1 % — ABNORMAL LOW (ref 36.0–46.0)
Hemoglobin: 11.7 g/dL — ABNORMAL LOW (ref 12.0–15.0)
MCHC: 34.3 g/dL (ref 30.0–36.0)
MCV: 88.1 fl (ref 78.0–100.0)
Platelets: 231 10*3/uL (ref 150.0–400.0)
RBC: 3.87 Mil/uL (ref 3.87–5.11)
RDW: 12.9 % (ref 11.5–15.5)
WBC: 4 10*3/uL (ref 4.0–10.5)

## 2022-12-06 LAB — VITAMIN D 25 HYDROXY (VIT D DEFICIENCY, FRACTURES): VITD: 74.19 ng/mL (ref 30.00–100.00)

## 2022-12-06 LAB — T3, FREE: T3, Free: 3.5 pg/mL (ref 2.3–4.2)

## 2022-12-07 ENCOUNTER — Encounter: Payer: Self-pay | Admitting: Nurse Practitioner

## 2022-12-10 ENCOUNTER — Encounter: Payer: Self-pay | Admitting: Physician Assistant

## 2022-12-10 ENCOUNTER — Ambulatory Visit: Payer: 59 | Attending: Physician Assistant | Admitting: Physician Assistant

## 2022-12-10 VITALS — BP 130/80 | HR 71 | Resp 15 | Ht 63.0 in | Wt 170.2 lb

## 2022-12-10 DIAGNOSIS — M5134 Other intervertebral disc degeneration, thoracic region: Secondary | ICD-10-CM

## 2022-12-10 DIAGNOSIS — M25561 Pain in right knee: Secondary | ICD-10-CM

## 2022-12-10 DIAGNOSIS — M7061 Trochanteric bursitis, right hip: Secondary | ICD-10-CM

## 2022-12-10 DIAGNOSIS — M8589 Other specified disorders of bone density and structure, multiple sites: Secondary | ICD-10-CM | POA: Diagnosis not present

## 2022-12-10 DIAGNOSIS — Z79899 Other long term (current) drug therapy: Secondary | ICD-10-CM | POA: Diagnosis not present

## 2022-12-10 DIAGNOSIS — L409 Psoriasis, unspecified: Secondary | ICD-10-CM | POA: Diagnosis not present

## 2022-12-10 DIAGNOSIS — M0609 Rheumatoid arthritis without rheumatoid factor, multiple sites: Secondary | ICD-10-CM | POA: Diagnosis not present

## 2022-12-10 DIAGNOSIS — Z8679 Personal history of other diseases of the circulatory system: Secondary | ICD-10-CM

## 2022-12-10 DIAGNOSIS — M24521 Contracture, right elbow: Secondary | ICD-10-CM

## 2022-12-10 DIAGNOSIS — M24522 Contracture, left elbow: Secondary | ICD-10-CM

## 2022-12-10 DIAGNOSIS — R778 Other specified abnormalities of plasma proteins: Secondary | ICD-10-CM

## 2022-12-10 DIAGNOSIS — G8929 Other chronic pain: Secondary | ICD-10-CM

## 2022-12-10 DIAGNOSIS — G5701 Lesion of sciatic nerve, right lower limb: Secondary | ICD-10-CM

## 2022-12-10 DIAGNOSIS — Z8639 Personal history of other endocrine, nutritional and metabolic disease: Secondary | ICD-10-CM

## 2022-12-10 DIAGNOSIS — R5383 Other fatigue: Secondary | ICD-10-CM

## 2022-12-10 DIAGNOSIS — M5136 Other intervertebral disc degeneration, lumbar region: Secondary | ICD-10-CM | POA: Diagnosis not present

## 2022-12-10 DIAGNOSIS — Z8669 Personal history of other diseases of the nervous system and sense organs: Secondary | ICD-10-CM

## 2022-12-10 DIAGNOSIS — Z8719 Personal history of other diseases of the digestive system: Secondary | ICD-10-CM

## 2022-12-10 DIAGNOSIS — G4709 Other insomnia: Secondary | ICD-10-CM

## 2022-12-11 LAB — C-REACTIVE PROTEIN: CRP: 0.3 mg/L (ref <8.0)

## 2022-12-11 LAB — SEDIMENTATION RATE: Sed Rate: 6 mm/h (ref 0–30)

## 2022-12-11 NOTE — Progress Notes (Signed)
ESR and CRP WNL.  Please clarify if the patient would like to schedule an ultrasound to assess for synovitis.

## 2022-12-12 ENCOUNTER — Ambulatory Visit: Payer: 59 | Attending: Physician Assistant

## 2022-12-12 DIAGNOSIS — M5459 Other low back pain: Secondary | ICD-10-CM | POA: Diagnosis not present

## 2022-12-12 DIAGNOSIS — M25551 Pain in right hip: Secondary | ICD-10-CM | POA: Insufficient documentation

## 2022-12-12 DIAGNOSIS — M6281 Muscle weakness (generalized): Secondary | ICD-10-CM | POA: Insufficient documentation

## 2022-12-12 NOTE — Therapy (Signed)
OUTPATIENT PHYSICAL THERAPY THORACOLUMBAR TREATMENT  Patient Name: [name] MRN: [id number] DOB:[birth year], 56 y.o., female Today's Date: 12/12/2022  END OF SESSION:  PT End of Session - 12/12/22 1621     Visit Number 2    Number of Visits 16    Date for PT Re-Evaluation 01/21/23    Authorization Type 1/10 - 11/26/2022 initial eval    PT Start Time 1515    PT Stop Time 1600    PT Time Calculation (min) 45 min    Activity Tolerance Patient tolerated treatment well    Behavior During Therapy WFL for tasks assessed/performed            Past Medical History:  Diagnosis Date   ACUT MI ANTEROLAT WALL SUBSQT EPIS CARE    Acute maxillary sinusitis    ALLERGIC RHINITIS, SEASONAL    Anemia    ARTHRITIS, RHEUMATOID    shoulders and hands Enbrel>leg swelling Dr. Estanislado Pandy   Atrial fibrillation [facility])    a. after CABG.   Back pain    Bruxism (teeth grinding)    CAD, ARTERY BYPASS GRAFT    a. DES to RCA in 2010 then LAD occlusion s/p CABG 3 06/07/2009 with LIMA to LAD, reverse SVG to D1, reverse SVG to distal RCA. b. Cath 05/08/2016 slightly hypodense region in the intermediate branch, however she had excellent flow, FFR was normal. Vein graft to PDA and the posterolateral branch is patent, patent LIMA to LAD, occluded SVG to diagonal.   CARPAL TUNNEL SYNDROME, BILATERAL    CHOLELITHIASIS    Contrast media allergy    DERMATITIS, ALLERGIC    DIABETES MELLITUS, TYPE I    on insulin pump dx'ed age 66 y.o    DIABETIC  RETINOPATHY    Hiatal hernia    HYPERLIPIDEMIA-MIXED    HYPERTHYROIDISM    Dr. Dollene Cleveland   IDA (iron deficiency anemia)    Insulin pump in place    Lymphadenopathy of head and neck    MIGRAINE W/O AURA W/INTRACT W/STATUS MIGRAINOSUS 02/19/2008   PONV (postoperative nausea and vomiting)    Psoriasis    SINUS TACHYCARDIA 11/08/2010   Sleep apnea    not using machine yet    SVT (supraventricular tachycardia)    after s/p CABG   TRIGGER FINGER    all  fingers b/l hands    URI    Past Surgical History:  Procedure Laterality Date   ABDOMINAL HYSTERECTOMY     endometriomas b/l; total ? cervix removed; no h/o abnormal pap   Caesarean section     CARDIAC CATHETERIZATION N/A 05/08/2016   Procedure: Left Heart Cath and Cors/Grafts Angiography;  Surgeon: Burnell Blanks, MD;  Location: [place] [facility];  Service: Cardiovascular;  Laterality: N/A;   CARPAL TUNNEL RELEASE     b/l    CARPAL TUNNEL RELEASE Left 06/06/2021   Procedure: CARPAL TUNNEL RELEASE;  Surgeon: Daryll Brod, MD;  Location: [place];  Service: Orthopedics;  Laterality: Left;  AXILLARY BLOCK   CATARACT EXTRACTION, BILATERAL     CHOLECYSTECTOMY     COLONOSCOPY WITH PROPOFOL N/A 03/25/2020   Procedure: COLONOSCOPY WITH PROPOFOL;  Surgeon: Jonathon Bellows, MD;  Location: [facility] ENDOSCOPY;  Service: Gastroenterology;  Laterality: N/A;   CORONARY ARTERY BYPASS GRAFT     ESOPHAGOGASTRODUODENOSCOPY (EGD) WITH PROPOFOL N/A 03/25/2020   Procedure: ESOPHAGOGASTRODUODENOSCOPY (EGD) WITH PROPOFOL;  Surgeon: Jonathon Bellows, MD;  Location: [facility] ENDOSCOPY;  Service: Gastroenterology;  Laterality: N/A;   EYE SURGERY  laser x 2 for retinopathy    LEFT HEART CATH AND CORS/GRAFTS ANGIOGRAPHY N/A 02/22/2020   Procedure: LEFT HEART CATH AND CORS/GRAFTS ANGIOGRAPHY;  Surgeon: Burnell Blanks, MD;  Location: [place] [facility];  Service: Cardiovascular;  Laterality: N/A;   TRIGGER FINGER RELEASE     b/l fingers all    ULNAR NERVE TRANSPOSITION Left 06/06/2021   Procedure: ULNAR NERVE DECOMPRESSION;  Surgeon: Daryll Brod, MD;  Location: [place];  Service: Orthopedics;  Laterality: Left;  AXILLARY BLOCK   VITRECTOMY     b/l    Patient Active Problem List   Diagnosis Date Noted   Left foot pain 12/05/2022   Urinary urgency 10/23/2022   Pyelonephritis 10/23/2022   Premature atrial contractions 05/15/2022   PVC's (premature ventricular contractions)  05/15/2022   Low hemoglobin 01/16/2022   Right ear pain 01/09/2022   Sore throat 01/09/2022   OSA on CPAP 08/02/2021   Abnormal MRI, lumbar spine 03/20/2021   Abnormal MRI, thoracic spine 03/20/2021   Chronic mid back pain 03/17/2021   Chronic midline low back pain with left-sided sciatica 03/17/2021   Type 1 diabetes mellitus with diabetic polyneuropathy ([place]) 02/17/2021   Severe obstructive sleep apnea-hypopnea syndrome 02/17/2021   Chronic diastolic CHF (congestive heart failure) ([place]) 12/19/2020   Chronic back pain 12/19/2020   Chronic abdominal pain 96/29/5284   Acute diastolic heart failure ([place]) 12/09/2020   Diabetic retinopathy associated with type 1 diabetes mellitus ([place]) 09/27/2020   Diabetic retinopathy ([place]) 09/27/2020   Graves' disease 09/27/2020   Chronic coronary artery disease 09/27/2020   Hypertension associated with diabetes ([place]) 09/14/2020   Obesity (BMI 30-39.9) 09/14/2020   Preventative health care 09/14/2020   Aortic atherosclerosis ([place]) 09/14/2020   Snoring 05/12/2020   Lung nodule 03/10/2020   Colitis 03/10/2020   Type 1 diabetes mellitus with proliferative retinopathy ([place]) 12/10/2019   Abnormal thyroid function test 12/10/2019   Bruxism (teeth grinding)    Chronic migraine 10/07/2019   Contusion of left hip 07/25/2019   H/O multiple allergies 03/10/2019   Hx of anaphylaxis 03/10/2019   Cough 06/26/2018   PND (post-nasal drip) 06/26/2018   Lumbar strain, initial encounter 04/10/2018   Thoracic myofascial strain 04/10/2018   Vitamin D deficiency 07/04/2017   B12 deficiency 07/04/2017   Abnormal CT scan 06/19/2017   Gastroesophageal reflux disease 06/19/2017   Antiplatelet or antithrombotic long-term use 06/19/2017   Nasal congestion 03/15/2017   Graves disease 02/01/2017   Sleep difficulties 02/01/2017   No energy 02/01/2017   Osteopenia 02/01/2017   Bilateral hand pain 07/16/2016   Acquired trigger finger 07/16/2016   Hypertension,  essential 05/08/2016   Foot pain, right 08/30/2014   Shoulder pain, right 08/30/2014   Chronic pain syndrome 01/15/2014   Multiple joint pain 01/15/2014   Lesion of lower eyelid 04/21/2013   Generalized constriction of visual field 03/31/2013   Neoplasm of uncertain behavior of skin of eyelid 03/31/2013   Posterior capsular opacification 03/31/2013   Cyst of left lower eyelid 03/30/2013   Pseudophakia of both eyes 03/30/2013   Prurigo nodularis 02/05/2013   Stasis dermatitis 02/05/2013   Psoriasis 01/21/2013   Trochanteric bursitis 11/10/2012   Achilles tendinitis 07/11/2012   Epiretinal membrane 06/17/2012   Status post cataract extraction 04/30/2012   Pes equinus, acquired 04/23/2012   Tendinitis of ankle 04/23/2012   Proliferative diabetic retinopathy of both eyes ([place]) 01/11/2012   Ongoing use of possibly toxic medication 12/05/2011   Hyperthyroidism 11/09/2010   SINUS TACHYCARDIA  11/08/2010   DERMATITIS, ALLERGIC 07/20/2010   EDEMA 07/12/2010   Dizziness 11/14/2009   ATRIAL FIBRILLATION 07/20/2009   Hx of CABG 06/07/2009   ANGINA, STABLE/EXERTIONAL 06/02/2009   Dyslipidemia, goal LDL below 70 04/26/2009   ACUT MI ANTEROLAT WALL SUBSQT EPIS CARE 04/26/2009   Chest pain 04/26/2009   DIABETIC  RETINOPATHY 04/25/2009   CARPAL TUNNEL SYNDROME, BILATERAL 04/25/2009   TRIGGER FINGER 04/25/2009   MIGRAINE W/O AURA W/INTRACT W/STATUS MIGRAINOSUS 02/19/2008   ALLERGIC RHINITIS, SEASONAL 10/16/2006   CHOLELITHIASIS 10/16/2006   Rheumatoid arthritis ([place]) 10/16/2006   PCP: Karl Ito  REFERRING PROVIDER: Erskine Emery  REFERRING DIAG:  M25.551 (ICD-10-CM) - Pain in right hip  M54.50 (ICD-10-CM) - Acute right-sided low back pain without sciatica   Rationale for Evaluation and Treatment: Rehabilitation  THERAPY DIAG:  Muscle weakness (generalized)  Other low back pain  Pain in right hip  ONSET DATE: 3 months ago  SUBJECTIVE:                                                                                                                                                                                            SUBJECTIVE STATEMENT: Patient reports their back and (R) hip has been painful over the last 2 weeks since they came in for the initial evaluation. Patient reports their average low back pain over the last week around a 6/10, and hip average hip pain a 5/10 on the NPRS. Patient did report they experienced a shooting pain down their (R) leg on Monday when they moved suddenly. Patient reports some increased stiffness and soreness in her low back and (R) hip toward the end of their work day and when they do more activity.  PERTINENT HISTORY:  Patient reports they have been experiencing pain in their (R) hip and low back. Patient reports experiencing (R) hip pain since the early summer which started insidiously at first and got worse when they fell on their (R) hip when they were riding a golf cart at a wedding. Patient reports their low back pain began 3 months ago insidiously. Patient has had low back issues before but is experiencing increased back pain now. 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 Patient reports stiffness and soreness the (R) hip and low back when waking up, both get worse toward end of the day as they do more activity, and reports she often experiences sharp pain in her back that can wake her up at night. Patient is  a 56 year old female with a PMH of acute MI, Anemia, RA, Afib, LBP, CAD, CTS, DM type 1, Hiatal hernia, HLD, hyperthyroidism, sinus tachycardia, hysterectomy, C-section sx, CT release.   PAIN:  Back Pain:  Are you having pain? Yes: NPRS scale: 5/10 Pain location: (R) low back Pain description: sore, achy pain Aggravating factors: pushing and pulling  Relieving factors:  heat, ibuprofen  Back pain:7/10 worst, 3/10 at best.  *Patient reported taking ibuprofen for pain even though she knows she is not supposed to on blood thinners*  (R) Hip Pain Are you having pain? Yes: NPRS scale: 5/10 Pain location: Greater trochanter Pain description: sore and sometimes sharp Aggravating factors: driving and sitting Relieving factors: Ibuprofen   Hip pain: 8/10 worst, 5/10 at best.  PRECAUTIONS: None  WEIGHT BEARING RESTRICTIONS: No  FALLS:  Has patient fallen in last 6 months? Yes. 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  Lower thoracic upper  lumbar disc space narrowing otherwise disc base well-maintained.  No  spondylolisthesis.  Facet arthritic changes L4-5 L5-S1.  Arthrosclerosis  aorta.  No acute fractures.   AP pelvis lateral view of the right hip: Bilateral hips well located.  No  acute fractures or bony abnormalities. Right hip joints well-maintained.   PATIENT SURVEYS:  FOTO 57% Oswestry : To perform next session  SCREENING FOR RED FLAGS: Bowel or bladder incontinence: No Spinal tumors: No Cauda equina syndrome: No Compression fracture: No Abdominal aneurysm: No  COGNITION: Overall cognitive status: Within functional limits for tasks assessed     SENSATION: Not tested  MUSCLE LENGTH: Hamstrings: Right 55* deg; Left 83 deg  POSTURE: rounded shoulders and forward head  PALPATION: TTP on (R) low back and lumbar  paraspinals. Lumbar spine hypomobile and sore with CPAs to lumbar spine.   LUMBAR ROM:   AROM eval  Flexion WFL, finger tips to AJL  Extension 10 deg  Right lateral flexion 15  Left lateral flexion 18  Right rotation 27  Left rotation 45   (Blank rows = not tested)  Pain with OP with every lumbar ROM, worse pain on the right side  LOWER EXTREMITY ROM:     Active  Right eval Left eval  Hip flexion 95* sharp 105  Hip extension    Hip abduction 25* sharp 35  Hip adduction WFL* pain at ER [facility]  Hip internal rotation 18* 20  Hip external rotation 23* 25   (Blank rows = not tested) *pain  LOWER EXTREMITY MMT:    MMT Right eval Left eval  Hip flexion 4/5* 5/5  Hip extension 3/5* (unable to assess further due to pain)  5/5  Hip abduction 4/5 5/5  Hip adduction 5/5 5/5  Hip internal rotation 4/5* (4/10 pain) 5/5  Hip external rotation 4/5* (6/10 pain) 5/5   (Blank rows = not tested)  *pain  Core strength: lowering legs to table in supine causes pain in back and (R) hip   LUMBAR SPECIAL  TESTS:  Straight leg raise test: Unable to assess due to pain, no reports of N/T however   Seated Piriformis Test: (+) on right side   FUNCTIONAL TESTS:  30 seconds chair stand test: 9 STS  GAIT: Not tested today   TODAY'S TREATMENT:                                                                                                                              DATE: 12/12/22   Manual Therapy:  - Manual HS stretch 3x30 secs ea - Lumbar and thoracic CPAs grade 1-2 1x30 sec ea level *Hypomobile through lumbar spine and thoracic spine, some TTP around the L2 and T11 level*  - STM to thoracic paraspinals 5 min  There Ex:  HEP Review and Education   - Seated Thoracic Flexion and Rotation with Swiss Ball 1x10x5 sec - Supine Lower Trunk Rotation 1x10 ea - Seated Piriformis Stretch  2x30 sec (B) - Seated Hip Abduction with Resistance  2x10 BTB - Seated Hip Adduction Isometrics with Ball  2x10x 2 second hold - Supine Sciatic Nerve Glide 1x10 - Seated Slump Nerve Glide 1x10  Trigger Point Dry Needling (TDN), unbilled Education performed with patient regarding potential benefit of TDN. Reviewed precautions and risks with patient. Reviewed special precautions/risks over lung fields which include pneumothorax. Reviewed signs and symptoms of pneumothorax and advised pt to go to ER immediately if these symptoms develop advise them of dry needling treatment. Extensive time spent with pt to ensure full understanding of TDN risks. Pt provided verbal consent to treatment. TDN performed to  with 0.25 x 40 single needle placements with local twitch response (LTR). Pistoning technique utilized. Improved pain-free motion following intervention. Muscles targeted include (B) glutes and lumbar paraspinals.   PATIENT EDUCATION:  Education details: Economist, pain management, POC Person educated: Patient Education method: Explanation and Demonstration Education comprehension: verbalized understanding  HOME EXERCISE PROGRAM:  Access Code: [email or web address] URL: [email or web address] Date: 12/12/2022 Prepared by: Janna Arch  Exercises - Seated Thoracic Flexion and Rotation with Swiss Ball  - 1 x daily - 7 x weekly - 2 sets - 10 reps - 5 hold - Supine Lower Trunk Rotation  - 1 x daily - 7 x weekly - 2 sets - 10 reps - 5 hold - Seated Piriformis Stretch  - 1 x daily - 7 x weekly - 1 sets - 3 reps - 30 hold - Seated Hip Abduction with Resistance  - 1 x daily - 7 x weekly - 3 sets - 10 reps - 1 hold - Seated Hip Adduction Isometrics with Ball  - 1 x daily - 7 x weekly - 3 sets - 10 reps - 5 hold - Supine Sciatic Nerve Glide  - 1 x daily - 7 x weekly - 2 sets - 10 reps - 2 hold - Seated Slump Nerve Glide  - 1 x daily - 7 x weekly - 2 sets - 10 reps -  2 hold  ASSESSMENT:  CLINICAL IMPRESSION: The patient presents to therapy 2 weeks following her initial evaluation similar to her  presentation that day. The patient still presents with pain and increased tension in her low back and (B) hips. Manual therapy was performed on the patient prior to therapeutic exercise to decrease pain levels and decrease built up tension from working all day. The patient was given her HEP and was educated on proper form, dosage, and frequency. The patient verbalized understanding of what is expected of her with her HEP. Pt will continue to benefit from skilled physical therapy intervention to address impairments, improve QOL, and attain therapy goals.    OBJECTIVE IMPAIRMENTS: decreased activity tolerance, decreased balance, decreased endurance, decreased mobility, difficulty walking, decreased ROM, decreased strength, impaired flexibility, improper body mechanics, and pain.   ACTIVITY LIMITATIONS: carrying, lifting, bending, sitting, standing, squatting, sleeping, and stairs  PARTICIPATION LIMITATIONS: cleaning, driving, shopping, community activity, and occupation  PERSONAL FACTORS: Age and 3+ comorbidities:    Patient is a 56 year old female with a PMH of acute MI, Anemia, RA, Afib, LBP, CAD, CTS, DM type 1, Hiatal hernia, HLD, hyperthyroidism, sinus tachycardia, hysterectomy, C-section sx, CT release are also affecting patient's functional outcome.   REHAB POTENTIAL: Good  CLINICAL DECISION MAKING: Stable/uncomplicated  EVALUATION COMPLEXITY: Low   GOALS: Goals reviewed with patient? Yes  SHORT TERM GOALS: Target date: 12/24/2022  Patient will be independent in home exercise program to improve strength/mobility for better functional independence with ADLs. Baseline: 11/26/2022: Give patient HEP next session Goal status: INITIAL  LONG TERM GOALS: Target date: 01/21/2023  Patient will increase FOTO score to equal to or greater than   66% to demonstrate statistically significant improvement in mobility and quality of life.  Baseline: 11/26/2022: 57% Goal status: INITIAL  2.    Patient will complete 14 sit to stands in < 30 seconds indicating an increased LE strength and improved balance without pain increase. Baseline: 11/26/2022: 9 STS in 30 sec Goal status: INITIAL  3.  Patient will improve (R) hamstring length to > 75 deg without pain to increase functional capability of her (R) hip and overall mobility.  Baseline: 11/26/2022: Hamstrings: Right 55* deg Goal status: INITIAL  4.  Patient will report a worst pain of 3/10 on VAS to improve tolerance with ADLs and reduced symptoms with activities.  Baseline: 11/26/2022: 5/10 resting pain in LB and (R) hip Goal status: INITIAL  5.  Patient will reduce ODI score as to demonstrate minimal disability with ADLs including improved sleeping tolerance, walking/sitting tolerance etc for better mobility with ADLs.  Baseline: 11/26/2022: Patient started ODI but didn't complete back page. Perform next session Goal status: INITIAL  PLAN:  PT FREQUENCY: 2x/week  PT DURATION: 8 weeks  PLANNED INTERVENTIONS: Therapeutic exercises, Therapeutic activity, Neuromuscular re-education, Balance training, Gait training, Patient/Family education, Self Care, Joint mobilization, Joint manipulation, Stair training, Vestibular training, Canalith repositioning, DME instructions, Aquatic Therapy, Dry Needling, Electrical stimulation, Spinal manipulation, Spinal mobilization, Cryotherapy, Moist heat, scar mobilization, Taping, Traction, Ultrasound, Manual therapy, and Re-evaluation.  PLAN FOR NEXT SESSION: Create HEP. Demonstrate HEP and make sure patient has good understanding of it. Have patient finish ODI.   Sudie Bailey SPT  This entire session was performed under direct supervision and direction of a licensed therapist/therapist assistant . I have personally read, edited and approve of the note as written.  Janna Arch, PT 12/12/2022, 4:22 PM   This entire session was performed under direct supervision and direction of  a licensed  Chiropractor . I have personally read, edited and approve of the note as written.

## 2022-12-13 ENCOUNTER — Encounter: Payer: Self-pay | Admitting: Physical Medicine and Rehabilitation

## 2022-12-16 ENCOUNTER — Other Ambulatory Visit: Payer: Self-pay

## 2022-12-17 ENCOUNTER — Other Ambulatory Visit: Payer: Self-pay | Admitting: Physician Assistant

## 2022-12-17 ENCOUNTER — Other Ambulatory Visit: Payer: Self-pay

## 2022-12-17 MED ORDER — INSULIN LISPRO 100 UNIT/ML IJ SOLN
90.0000 [IU] | Freq: Every day | INTRAMUSCULAR | 5 refills | Status: DC
  Filled 2022-12-17: qty 30, 30d supply, fill #0
  Filled 2022-12-19: qty 90, 90d supply, fill #0
  Filled 2023-03-19: qty 30, 33d supply, fill #1
  Filled 2023-03-22: qty 90, 90d supply, fill #1

## 2022-12-17 MED FILL — Clopidogrel Bisulfate Tab 75 MG (Base Equiv): ORAL | 90 days supply | Qty: 90 | Fill #5 | Status: CN

## 2022-12-17 MED FILL — Cyclobenzaprine HCl Tab 10 MG: ORAL | 10 days supply | Qty: 30 | Fill #0 | Status: CN

## 2022-12-17 MED FILL — Clopidogrel Bisulfate Tab 75 MG (Base Equiv): ORAL | 90 days supply | Qty: 90 | Fill #0 | Status: AC
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
## 2022-12-18 ENCOUNTER — Ambulatory Visit: Payer: 59

## 2022-12-18 DIAGNOSIS — M5459 Other low back pain: Secondary | ICD-10-CM

## 2022-12-18 DIAGNOSIS — M25551 Pain in right hip: Secondary | ICD-10-CM | POA: Diagnosis not present

## 2022-12-18 DIAGNOSIS — M6281 Muscle weakness (generalized): Secondary | ICD-10-CM | POA: Diagnosis not present

## 2022-12-19 ENCOUNTER — Other Ambulatory Visit: Payer: Self-pay

## 2022-12-20 ENCOUNTER — Other Ambulatory Visit: Payer: Self-pay | Admitting: Rheumatology

## 2022-12-20 ENCOUNTER — Other Ambulatory Visit: Payer: Self-pay

## 2022-12-21 ENCOUNTER — Other Ambulatory Visit: Payer: Self-pay

## 2022-12-21 ENCOUNTER — Other Ambulatory Visit (HOSPITAL_BASED_OUTPATIENT_CLINIC_OR_DEPARTMENT_OTHER): Payer: Self-pay

## 2022-12-21 MED FILL — Cyclobenzaprine HCl Tab 10 MG: ORAL | 10 days supply | Qty: 30 | Fill #0 | Status: CN

## 2022-12-25 ENCOUNTER — Other Ambulatory Visit (HOSPITAL_COMMUNITY): Payer: Self-pay

## 2022-12-26 ENCOUNTER — Other Ambulatory Visit (HOSPITAL_COMMUNITY): Payer: Self-pay

## 2022-12-28 ENCOUNTER — Other Ambulatory Visit (HOSPITAL_COMMUNITY): Payer: Self-pay

## 2022-12-28 ENCOUNTER — Other Ambulatory Visit: Payer: Self-pay | Admitting: Rheumatology

## 2022-12-28 MED ORDER — LEFLUNOMIDE 20 MG PO TABS
20.0000 mg | ORAL_TABLET | Freq: Every day | ORAL | 0 refills | Status: DC
  Filled 2022-12-28 – 2023-01-02 (×9): qty 90, 90d supply, fill #0

## 2022-12-28 NOTE — Telephone Encounter (Signed)
Next Visit: 03/14/2023  Last Visit: 12/10/2022  Last Fill: 09/21/2022  DX: Rheumatoid arthritis of multiple sites with negative rheumatoid factor   Current Dose per office note 12/10/2022: Arava 20 mg 1 tablet by mouth daily.   Labs: 12/05/2022 Hgb 11.7, Hct 34.1, Glucose 172  Okay to refill Arava?

## 2023-01-01 ENCOUNTER — Other Ambulatory Visit: Payer: Self-pay

## 2023-01-01 ENCOUNTER — Other Ambulatory Visit (HOSPITAL_COMMUNITY): Payer: Self-pay

## 2023-01-01 ENCOUNTER — Telehealth: Payer: Self-pay | Admitting: Gastroenterology

## 2023-01-01 ENCOUNTER — Other Ambulatory Visit: Payer: Self-pay | Admitting: Gastroenterology

## 2023-01-01 MED ORDER — METRONIDAZOLE 500 MG PO TABS
500.0000 mg | ORAL_TABLET | Freq: Three times a day (TID) | ORAL | 0 refills | Status: AC
  Filled 2023-01-01: qty 30, 10d supply, fill #0

## 2023-01-01 MED ORDER — CIPROFLOXACIN HCL 500 MG PO TABS
500.0000 mg | ORAL_TABLET | Freq: Two times a day (BID) | ORAL | 0 refills | Status: AC
  Filled 2023-01-01: qty 20, 10d supply, fill #0

## 2023-01-01 NOTE — Telephone Encounter (Signed)
Maritza she called in with right lower quadrant pain similar symptoms in the past possible colitis.  Commence on ciprofloxacin 500 mg twice daily and Flagyl 500 mg 3 times daily for 10 days  Dr Jonathon Bellows MD,MRCP [facility]) Gastroenterology/Hepatology Pager: [phone]

## 2023-01-01 NOTE — Telephone Encounter (Signed)
Dr. Vicente Males sent me a message stating that he had sent patient her antibiotics.

## 2023-01-02 ENCOUNTER — Other Ambulatory Visit: Payer: Self-pay

## 2023-01-02 ENCOUNTER — Other Ambulatory Visit (HOSPITAL_COMMUNITY): Payer: Self-pay

## 2023-01-03 ENCOUNTER — Other Ambulatory Visit (HOSPITAL_COMMUNITY): Payer: Self-pay
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
## 2023-01-08 ENCOUNTER — Telehealth: Payer: Self-pay

## 2023-01-08 ENCOUNTER — Ambulatory Visit
Admission: RE | Admit: 2023-01-08 | Discharge: 2023-01-08 | Disposition: A | Discharge status: Home / Self Care | Payer: Commercial Managed Care - PPO | Source: Ambulatory Visit | Attending: Gastroenterology | Admitting: Gastroenterology

## 2023-01-08 ENCOUNTER — Ambulatory Visit: Payer: Commercial Managed Care - PPO

## 2023-01-08 DIAGNOSIS — G8929 Other chronic pain: Secondary | ICD-10-CM | POA: Diagnosis not present

## 2023-01-08 DIAGNOSIS — R109 Unspecified abdominal pain: Secondary | ICD-10-CM | POA: Diagnosis not present

## 2023-01-08 DIAGNOSIS — I700 Atherosclerosis of aorta: Secondary | ICD-10-CM | POA: Diagnosis not present

## 2023-01-08 DIAGNOSIS — R1084 Generalized abdominal pain: Secondary | ICD-10-CM | POA: Diagnosis not present

## 2023-01-08 NOTE — Telephone Encounter (Signed)
Dr. Vicente Males sent me a secured chat wanting for me to order a CT Scan Abdomen and Pelvis with and without contrast for abdominal pain. However, I told him that she was allergic to contrast. Therefore, he then stated to order it without contrast. Once I scheduled it, I called the patient to let her know. She did ot answer my call, so I messaed Dr. Vicente Males to let her know that she is to go now to the [facility].

## 2023-01-09 NOTE — Progress Notes (Signed)
I have informed her the results

## 2023-01-10 ENCOUNTER — Other Ambulatory Visit: Payer: Self-pay

## 2023-01-10 MED FILL — Losartan Potassium Tab 100 MG: ORAL | 90 days supply | Qty: 90 | Fill #3 | Status: AC

## 2023-01-10 MED FILL — Rosuvastatin Calcium Tab 20 MG: ORAL | 90 days supply | Qty: 90 | Fill #2 | Status: AC

## 2023-01-11 ENCOUNTER — Encounter (INDEPENDENT_AMBULATORY_CARE_PROVIDER_SITE_OTHER): Payer: Commercial Managed Care - PPO | Admitting: Ophthalmology

## 2023-01-11 DIAGNOSIS — H35033 Hypertensive retinopathy, bilateral: Secondary | ICD-10-CM | POA: Diagnosis not present

## 2023-01-11 DIAGNOSIS — E103593 Type 1 diabetes mellitus with proliferative diabetic retinopathy without macular edema, bilateral: Secondary | ICD-10-CM | POA: Diagnosis not present

## 2023-01-11 DIAGNOSIS — I10 Essential (primary) hypertension: Secondary | ICD-10-CM

## 2023-01-14 ENCOUNTER — Ambulatory Visit: Payer: Commercial Managed Care - PPO | Attending: Physician Assistant | Admitting: Physical Therapy

## 2023-01-14 DIAGNOSIS — M6281 Muscle weakness (generalized): Secondary | ICD-10-CM | POA: Insufficient documentation

## 2023-01-14 DIAGNOSIS — M25551 Pain in right hip: Secondary | ICD-10-CM | POA: Insufficient documentation

## 2023-01-14 DIAGNOSIS — M5459 Other low back pain: Secondary | ICD-10-CM | POA: Insufficient documentation
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
## 2023-01-16 ENCOUNTER — Other Ambulatory Visit: Payer: Self-pay
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
## 2023-01-18 ENCOUNTER — Ambulatory Visit
Admission: RE | Admit: 2023-01-18 | Discharge: 2023-01-18 | Disposition: A | Discharge status: Home / Self Care | Payer: Commercial Managed Care - PPO | Source: Ambulatory Visit | Attending: Nurse Practitioner | Admitting: Nurse Practitioner

## 2023-01-18 DIAGNOSIS — Z1231 Encounter for screening mammogram for malignant neoplasm of breast: Secondary | ICD-10-CM | POA: Insufficient documentation

## 2023-01-21 ENCOUNTER — Ambulatory Visit: Payer: Commercial Managed Care - PPO | Admitting: Physical Therapy

## 2023-01-21 ENCOUNTER — Encounter: Payer: Self-pay | Admitting: Physical Therapy

## 2023-01-21 DIAGNOSIS — M5459 Other low back pain: Secondary | ICD-10-CM

## 2023-01-21 DIAGNOSIS — M6281 Muscle weakness (generalized): Secondary | ICD-10-CM

## 2023-01-21 DIAGNOSIS — M25551 Pain in right hip: Secondary | ICD-10-CM
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
## 2023-01-23 ENCOUNTER — Other Ambulatory Visit (HOSPITAL_COMMUNITY): Payer: Self-pay

## 2023-01-24 ENCOUNTER — Encounter: Payer: Self-pay | Admitting: Physical Therapy

## 2023-01-24 ENCOUNTER — Ambulatory Visit: Payer: Commercial Managed Care - PPO | Admitting: Physical Therapy

## 2023-01-24 DIAGNOSIS — M6281 Muscle weakness (generalized): Secondary | ICD-10-CM

## 2023-01-24 DIAGNOSIS — M25551 Pain in right hip: Secondary | ICD-10-CM

## 2023-01-24 DIAGNOSIS — M5459 Other low back pain: Secondary | ICD-10-CM
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
## 2023-01-25 ENCOUNTER — Other Ambulatory Visit: Payer: Self-pay

## 2023-01-25 ENCOUNTER — Other Ambulatory Visit (HOSPITAL_COMMUNITY): Payer: Self-pay

## 2023-01-28 ENCOUNTER — Other Ambulatory Visit (HOSPITAL_COMMUNITY): Payer: Self-pay

## 2023-01-28 ENCOUNTER — Ambulatory Visit: Payer: Commercial Managed Care - PPO

## 2023-01-28 DIAGNOSIS — M6281 Muscle weakness (generalized): Secondary | ICD-10-CM | POA: Diagnosis not present

## 2023-01-28 DIAGNOSIS — M5459 Other low back pain: Secondary | ICD-10-CM | POA: Diagnosis not present

## 2023-01-28 DIAGNOSIS — M25551 Pain in right hip: Secondary | ICD-10-CM | POA: Diagnosis not present

## 2023-01-28 NOTE — Addendum Note (Signed)
Addended by: Rivka Barbara B on: 01/28/2023 09:16 AM   Modules accepted: Orders
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
## 2023-01-30 ENCOUNTER — Other Ambulatory Visit (HOSPITAL_COMMUNITY): Payer: Self-pay

## 2023-01-30 ENCOUNTER — Ambulatory Visit: Payer: Commercial Managed Care - PPO | Admitting: Physical Therapy

## 2023-01-30 ENCOUNTER — Encounter: Payer: Self-pay | Admitting: Physical Therapy

## 2023-01-30 DIAGNOSIS — M6281 Muscle weakness (generalized): Secondary | ICD-10-CM | POA: Diagnosis not present

## 2023-01-30 DIAGNOSIS — M25551 Pain in right hip: Secondary | ICD-10-CM | POA: Diagnosis not present

## 2023-01-30 DIAGNOSIS — M5459 Other low back pain: Secondary | ICD-10-CM | POA: Diagnosis not present

## 2023-02-04 ENCOUNTER — Ambulatory Visit: Payer: Commercial Managed Care - PPO | Attending: Physician Assistant

## 2023-02-04 DIAGNOSIS — M25551 Pain in right hip: Secondary | ICD-10-CM | POA: Insufficient documentation

## 2023-02-04 DIAGNOSIS — M6281 Muscle weakness (generalized): Secondary | ICD-10-CM | POA: Insufficient documentation

## 2023-02-04 DIAGNOSIS — M5459 Other low back pain: Secondary | ICD-10-CM | POA: Insufficient documentation
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
## 2023-02-06 ENCOUNTER — Encounter: Payer: Self-pay | Admitting: Physical Therapy

## 2023-02-06 ENCOUNTER — Ambulatory Visit: Payer: Commercial Managed Care - PPO | Admitting: Physical Therapy

## 2023-02-06 DIAGNOSIS — M25551 Pain in right hip: Secondary | ICD-10-CM

## 2023-02-06 DIAGNOSIS — E10319 Type 1 diabetes mellitus with unspecified diabetic retinopathy without macular edema: Secondary | ICD-10-CM | POA: Diagnosis not present

## 2023-02-06 DIAGNOSIS — M6281 Muscle weakness (generalized): Secondary | ICD-10-CM

## 2023-02-06 DIAGNOSIS — M5459 Other low back pain: Secondary | ICD-10-CM
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
## 2023-02-07 ENCOUNTER — Other Ambulatory Visit: Payer: Self-pay
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
## 2023-02-11 ENCOUNTER — Ambulatory Visit: Payer: Commercial Managed Care - PPO

## 2023-02-11 DIAGNOSIS — M5459 Other low back pain: Secondary | ICD-10-CM

## 2023-02-11 DIAGNOSIS — M6281 Muscle weakness (generalized): Secondary | ICD-10-CM | POA: Diagnosis not present

## 2023-02-11 DIAGNOSIS — M25551 Pain in right hip: Secondary | ICD-10-CM

## 2023-02-13 ENCOUNTER — Ambulatory Visit: Payer: Commercial Managed Care - PPO | Admitting: Physical Therapy

## 2023-02-13 DIAGNOSIS — M6281 Muscle weakness (generalized): Secondary | ICD-10-CM | POA: Diagnosis not present

## 2023-02-13 DIAGNOSIS — M5459 Other low back pain: Secondary | ICD-10-CM

## 2023-02-13 DIAGNOSIS — M25551 Pain in right hip: Secondary | ICD-10-CM | POA: Diagnosis not present

## 2023-02-13 NOTE — Therapy (Unsigned)
OUTPATIENT PHYSICAL THERAPY THORACOLUMBAR TREATMENT   Patient Name: [name] MRN: [id number] DOB:[birth year], 57 y.o., female Today's Date: 02/14/2023  END OF SESSION:  PT End of Session - 02/13/23 1600     Visit Number 12    Number of Visits 23    Date for PT Re-Evaluation 03/25/23    Authorization Type 9/10 - 11/26/2022 initial eval    Progress Note Due on Visit 10    PT Start Time 1600    PT Stop Time 1640    PT Time Calculation (min) 40 min    Activity Tolerance Patient tolerated treatment well;No increased pain    Behavior During Therapy WFL for tasks assessed/performed                     Past Medical History:  Diagnosis Date   ACUT MI ANTEROLAT WALL SUBSQT EPIS CARE    Acute maxillary sinusitis    ALLERGIC RHINITIS, SEASONAL    Anemia    ARTHRITIS, RHEUMATOID    shoulders and hands Enbrel>leg swelling Dr. Estanislado Pandy   Atrial fibrillation [facility])    a. after CABG.   Back pain    Bruxism (teeth grinding)    CAD, ARTERY BYPASS GRAFT    a. DES to RCA in 2010 then LAD occlusion s/p CABG 3 06/07/2009 with LIMA to LAD, reverse SVG to D1, reverse SVG to distal RCA. b. Cath 05/08/2016 slightly hypodense region in the intermediate branch, however she had excellent flow, FFR was normal. Vein graft to PDA and the posterolateral branch is patent, patent LIMA to LAD, occluded SVG to diagonal.   CARPAL TUNNEL SYNDROME, BILATERAL    CHOLELITHIASIS    Contrast media allergy    DERMATITIS, ALLERGIC    DIABETES MELLITUS, TYPE I    on insulin pump dx'ed age 7 y.o    DIABETIC  RETINOPATHY    Hiatal hernia    HYPERLIPIDEMIA-MIXED    HYPERTHYROIDISM    Dr. Dollene Cleveland   IDA (iron deficiency anemia)    Insulin pump in place    Lymphadenopathy of head and neck    MIGRAINE W/O AURA W/INTRACT W/STATUS MIGRAINOSUS 02/19/2008   PONV (postoperative nausea and vomiting)    Psoriasis    SINUS TACHYCARDIA 11/08/2010   Sleep apnea    not using machine yet    SVT  (supraventricular tachycardia)    after s/p CABG   TRIGGER FINGER    all fingers b/l hands    URI    Past Surgical History:  Procedure Laterality Date   ABDOMINAL HYSTERECTOMY     endometriomas b/l; total ? cervix removed; no h/o abnormal pap   Caesarean section     CARDIAC CATHETERIZATION N/A 05/08/2016   Procedure: Left Heart Cath and Cors/Grafts Angiography;  Surgeon: Burnell Blanks, MD;  Location: [place] [facility];  Service: Cardiovascular;  Laterality: N/A;   CARPAL TUNNEL RELEASE     b/l    CARPAL TUNNEL RELEASE Left 06/06/2021   Procedure: CARPAL TUNNEL RELEASE;  Surgeon: Daryll Brod, MD;  Location: [place];  Service: Orthopedics;  Laterality: Left;  AXILLARY BLOCK   CATARACT EXTRACTION, BILATERAL     CHOLECYSTECTOMY     COLONOSCOPY WITH PROPOFOL N/A 03/25/2020   Procedure: COLONOSCOPY WITH PROPOFOL;  Surgeon: Jonathon Bellows, MD;  Location: [facility] ENDOSCOPY;  Service: Gastroenterology;  Laterality: N/A;   CORONARY ARTERY BYPASS GRAFT     ESOPHAGOGASTRODUODENOSCOPY (EGD) WITH PROPOFOL N/A 03/25/2020   Procedure: ESOPHAGOGASTRODUODENOSCOPY (EGD) WITH  PROPOFOL;  Surgeon: Jonathon Bellows, MD;  Location[facility]COPY;  Service: Gastroenterology;  Laterality: N/A;   EYE SURGERY     laser x 2 for retinopathy    LEFT HEART CATH AND CORS/GRAFTS ANGIOGRAPHY N/A 02/22/2020   Procedure: LEFT HEART CATH AND CORS/GRAFTS ANGIOGRAPHY;  Surgeon: Burnell Blanks, MD;  Location: [place] [facility];  Service: Cardiovascular;  Laterality: N/A;   TRIGGER FINGER RELEASE     b/l fingers all    ULNAR NERVE TRANSPOSITION Left 06/06/2021   Procedure: ULNAR NERVE DECOMPRESSION;  Surgeon: Daryll Brod, MD;  Location: [place];  Service: Orthopedics;  Laterality: Left;  AXILLARY BLOCK   VITRECTOMY     b/l    Patient Active Problem List   Diagnosis Date Noted   Left foot pain 12/05/2022   Urinary urgency 10/23/2022   Pyelonephritis 10/23/2022   Premature atrial  contractions 05/15/2022   PVC's (premature ventricular contractions) 05/15/2022   Low hemoglobin 01/16/2022   Right ear pain 01/09/2022   Sore throat 01/09/2022   OSA on CPAP 08/02/2021   Abnormal MRI, lumbar spine 03/20/2021   Abnormal MRI, thoracic spine 03/20/2021   Chronic mid back pain 03/17/2021   Chronic midline low back pain with left-sided sciatica 03/17/2021   Type 1 diabetes mellitus with diabetic polyneuropathy ([place]) 02/17/2021   Severe obstructive sleep apnea-hypopnea syndrome 02/17/2021   Chronic diastolic CHF (congestive heart failure) ([place]) 12/19/2020   Chronic back pain 12/19/2020   Chronic abdominal pain [id number]   Acute diastolic heart failure ([place]) 12/09/2020   Diabetic retinopathy associated with type 1 diabetes mellitus ([place]) 09/27/2020   Diabetic retinopathy ([place]) 09/27/2020   Graves' disease 09/27/2020   Chronic coronary artery disease 09/27/2020   Hypertension associated with diabetes ([place]) 09/14/2020   Obesity (BMI 30-39.9) 09/14/2020   Preventative health care 09/14/2020   Aortic atherosclerosis ([place]) 09/14/2020   Snoring 05/12/2020   Lung nodule 03/10/2020   Colitis 03/10/2020   Type 1 diabetes mellitus with proliferative retinopathy ([place]) 12/10/2019   Abnormal thyroid function test 12/10/2019   Bruxism (teeth grinding)    Chronic migraine 10/07/2019   Contusion of left hip 07/25/2019   H/O multiple allergies 03/10/2019   Hx of anaphylaxis 03/10/2019   Cough 06/26/2018   PND (post-nasal drip) 06/26/2018   Lumbar strain, initial encounter 04/10/2018   Thoracic myofascial strain 04/10/2018   Vitamin D deficiency 07/04/2017   B12 deficiency 07/04/2017   Abnormal CT scan 06/19/2017   Gastroesophageal reflux disease 06/19/2017   Antiplatelet or antithrombotic long-term use 06/19/2017   Nasal congestion 03/15/2017   Graves disease 02/01/2017   Sleep difficulties 02/01/2017   No energy 02/01/2017   Osteopenia 02/01/2017   Bilateral hand pain  07/16/2016   Acquired trigger finger 07/16/2016   Hypertension, essential 05/08/2016   Foot pain, right 08/30/2014   Shoulder pain, right 08/30/2014   Chronic pain syndrome 01/15/2014   Multiple joint pain 01/15/2014   Lesion of lower eyelid 04/21/2013   Generalized constriction of visual field 03/31/2013   Neoplasm of uncertain behavior of skin of eyelid 03/31/2013   Posterior capsular opacification 03/31/2013   Cyst of left lower eyelid 03/30/2013   Pseudophakia of both eyes 03/30/2013   Prurigo nodularis 02/05/2013   Stasis dermatitis 02/05/2013   Psoriasis 01/21/2013   Trochanteric bursitis 11/10/2012   Achilles tendinitis 07/11/2012   Epiretinal membrane 06/17/2012   Status post cataract extraction 04/30/2012   Pes equinus, acquired 04/23/2012   Tendinitis of ankle 04/23/2012   Proliferative  diabetic retinopathy of both eyes ([place]) 01/11/2012   Ongoing use of possibly toxic medication 12/05/2011   Hyperthyroidism 11/09/2010   SINUS TACHYCARDIA 11/08/2010   DERMATITIS, ALLERGIC 07/20/2010   EDEMA 07/12/2010   Dizziness 11/14/2009   ATRIAL FIBRILLATION 07/20/2009   Hx of CABG 06/07/2009   ANGINA, STABLE/EXERTIONAL 06/02/2009   Dyslipidemia, goal LDL below 70 04/26/2009   ACUT MI ANTEROLAT WALL SUBSQT EPIS CARE 04/26/2009   Chest pain 04/26/2009   DIABETIC  RETINOPATHY 04/25/2009   CARPAL TUNNEL SYNDROME, BILATERAL 04/25/2009   TRIGGER FINGER 04/25/2009   MIGRAINE W/O AURA W/INTRACT W/STATUS MIGRAINOSUS 02/19/2008   ALLERGIC RHINITIS, SEASONAL 10/16/2006   CHOLELITHIASIS 10/16/2006   Rheumatoid arthritis ([place]) 10/16/2006   PCP: Karl Ito  REFERRING PROVIDER: Erskine Emery  REFERRING DIAG:  M25.551 (ICD-10-CM) - Pain in right hip  M54.50 (ICD-10-CM) - Acute right-sided low back pain without sciatica   Rationale for Evaluation and Treatment: Rehabilitation  THERAPY DIAG:  Pain in right hip  Other low back pain  Muscle weakness (generalized)  ONSET DATE:  3 months ago  SUBJECTIVE:                                                                                                                                                                                           SUBJECTIVE STATEMENT:  Patient reports some improvement in left hip pain but does have slight increase in low back pain this date.  Still pain in both areas with hip pain reduced from -8/10 to 5 of 10 at this time.  Patient also reports she has been working a lot and had to work over 10 hours of overtime this week which she thinks could be contributing to her discomfort.  PERTINENT HISTORY:  Patient reports they have been experiencing pain in their (R) hip and low back. Patient reports experiencing (R) hip pain since the early summer which started insidiously at first and got worse when they fell on their (R) hip when they were riding a golf cart at a wedding. Patient reports their low back pain began 3 months ago insidiously. Patient has had low back issues before but is experiencing increased back pain now. 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 Patient reports stiffness and soreness the (R) hip and low back when waking up, both get worse toward end of the day as they do more activity, and reports she often experiences sharp pain in her back that can wake her up at night. Patient  is a 57 year old female with a PMH of acute MI, Anemia, RA, Afib, LBP, CAD, CTS, DM type 1, Hiatal hernia, HLD, hyperthyroidism, sinus tachycardia, hysterectomy, C-section sx, CT release.   PAIN:  Back Pain:  Are you having pain? Yes: NPRS scale: 6/10 Pain location: (R) low back   Pain description: sore, achy pain Aggravating factors: pushing and pulling  Relieving factors: heat, ibuprofen     (R) Hip Pain Are you having pain? Yes: NPRS scale:  6/10 Pain location: Greater trochanter and post hip  Pain description: sore and sometimes sharp Aggravating factors: driving and sitting Relieving factors: Ibuprofen   Hip pain: 8/10 worst, 5/10 at best.  PRECAUTIONS: None  WEIGHT BEARING RESTRICTIONS: No  FALLS:  Has patient fallen in last 6 months? Yes. 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  Lower thoracic upper  lumbar disc space narrowing otherwise disc base well-maintained.  No  spondylolisthesis.  Facet arthritic changes L4-5 L5-S1.  Arthrosclerosis  aorta.  No acute fractures.   AP pelvis lateral view of the right hip: Bilateral hips well located.  No  acute fractures or bony abnormalities. Right hip joints well-maintained.   PATIENT SURVEYS:  FOTO 57% Oswestry : To perform next session  SCREENING FOR RED FLAGS: Bowel or bladder incontinence: No Spinal tumors: No Cauda equina syndrome: No Compression fracture: No Abdominal aneurysm: No  COGNITION: Overall cognitive status: Within functional limits for tasks assessed     SENSATION: Not tested  MUSCLE LENGTH: Hamstrings: Right 55* deg; Left 83 deg  POSTURE: rounded shoulders and forward head  PALPATION: TTP on (R) low back and lumbar paraspinals. Lumbar spine hypomobile and sore with CPAs to lumbar spine.   LUMBAR ROM:   AROM eval  Flexion WFL, finger tips to AJL  Extension 10 deg  Right lateral flexion 15  Left lateral flexion 18   Right rotation 27  Left rotation 45   (Blank rows = not tested)  Pain with OP with every lumbar ROM, worse pain on the right side  LOWER EXTREMITY ROM:     Active  Right eval Left eval  Hip flexion 95* sharp 105  Hip extension    Hip abduction 25* sharp 35  Hip adduction WFL* pain at ER [facility]  Hip internal rotation 18* 20  Hip external rotation 23* 25   (Blank rows = not tested) *pain  LOWER EXTREMITY MMT:    MMT Right eval Left eval  Hip flexion 4/5* 5/5  Hip extension 3/5* (unable to assess further due to pain)  5/5  Hip abduction 4/5 5/5  Hip adduction 5/5 5/5  Hip internal rotation 4/5* (4/10 pain) 5/5  Hip external rotation 4/5* (6/10 pain) 5/5   (Blank rows = not tested)  *pain  Core strength: lowering legs to table in supine causes pain in back and (R) hip   LUMBAR SPECIAL TESTS:  Straight leg raise test: Unable to assess due to pain, no reports of N/T  however   Seated Piriformis Test: (+) on right side   FUNCTIONAL TESTS:  30 seconds chair stand test: 9 STS  GAIT: Not tested today   TODAY'S TREATMENT:                                                                                                                              DATE: 02/14/23     Manual Therapy:   Hamstring lengthening stretch 60 seconds RLE  Single knee to chest 60 seconds RLE Figure four stretch 60 seconds RLE   Roller to: R IT band and TFL x 6 minutes   TE supine Bridge with red TB around knees for increased gluteal activation 2 x 10   sidelying Clamshell 2x10 with RTB R LE only, some discomfort in target musculature.  Reverse clamshells  2 x 10  R LE only   Manual hip flexor stretch 2 x 45 sec on the R   Sitting: Figure four stretch 60 seconds  Prone: Hamstring curl 10x Hip extension RLE 2 x 10       PATIENT EDUCATION:  Education details: Economist, pain management, POC Person educated: Patient Education method: Explanation and Demonstration Education  comprehension: verbalized understanding  HOME EXERCISE PROGRAM:  Access Code: [email or web address] URL: [email or web address] Date: 12/12/2022 Prepared by: Janna Arch  Exercises - Seated Thoracic Flexion and Rotation with Swiss Ball  - 1 x daily - 7 x weekly - 2 sets - 10 reps - 5 hold - Supine Lower Trunk Rotation  - 1 x daily - 7 x weekly - 2 sets - 10 reps - 5 hold - Seated Piriformis Stretch  - 1 x daily - 7 x weekly - 1 sets - 3 reps - 30 hold - Seated Hip Abduction with Resistance  - 1 x daily - 7 x weekly - 3 sets - 10 reps - 1 hold - Seated Hip Adduction Isometrics with Ball  - 1 x daily - 7 x weekly - 3 sets - 10 reps - 5 hold - Supine Sciatic Nerve Glide  - 1 x daily - 7 x weekly - 2 sets - 10 reps - 2 hold - Seated Slump Nerve Glide  - 1 x daily - 7 x weekly - 2 sets - 10 reps - 2 hold  ASSESSMENT:  CLINICAL IMPRESSION:  Patient presents with good motivation for completion of physical therapy activities.  Patient did experience slight improvement in hip pain since last session for similar interventions targeting the TFL and the IT band on her right side were continued today to see if further improvements in hip pain could come as a result.  Patient's low back pain was slightly increased but was not significantly limiting at this time.  Patient will continue to benefit with skilled physical therapy to improve her pain and function.   OBJECTIVE IMPAIRMENTS: decreased activity tolerance, decreased balance, decreased endurance, decreased mobility, difficulty walking, decreased ROM, decreased strength, impaired flexibility, improper  body mechanics, and pain.   ACTIVITY LIMITATIONS: carrying, lifting, bending, sitting, standing, squatting, sleeping, and stairs  PARTICIPATION LIMITATIONS: cleaning, driving, shopping, community activity, and occupation  PERSONAL FACTORS: Age and 3+ comorbidities:    Patient is a 57 year old female with a PMH of acute MI, Anemia, RA, Afib, LBP,  CAD, CTS, DM type 1, Hiatal hernia, HLD, hyperthyroidism, sinus tachycardia, hysterectomy, C-section sx, CT release are also affecting patient's functional outcome.   REHAB POTENTIAL: Good  CLINICAL DECISION MAKING: Stable/uncomplicated  EVALUATION COMPLEXITY: Low   GOALS: Goals reviewed with patient? Yes  SHORT TERM GOALS: Target date: 02/11/2023    Patient will be independent in home exercise program to improve strength/mobility for better functional independence with ADLs. Baseline: 11/26/2022: Give patient HEP next session 1/29: HEP intermittent compliance  Goal status: Partially Met   LONG TERM GOALS: Target date: 03/25/2023    Patient will increase FOTO score to equal to or greater than   66% to demonstrate statistically significant improvement in mobility and quality of life.  Baseline: 11/26/2022: 57% 1/29: 49% 2/7:47% Goal status: Ongoing  2.   Patient will complete 14 sit to stands in < 30 seconds indicating an increased LE strength and improved balance without pain increase. Baseline: 11/26/2022: 9 STS in 30 sec 1/29: 10  Goal status: Partially Met   3.  Patient will improve (R) hamstring length to > 75 deg without pain to increase functional capability of her (R) hip and overall mobility.  Baseline: 11/26/2022: Hamstrings: Right 55* deg 1/29: 70 degrees Goal status:Partially Met  4.  Patient will report a worst pain of 3/10 on VAS to improve tolerance with ADLs and reduced symptoms with activities.  Baseline: 11/26/2022: 5/10 resting pain in LB and (R) hip 1/29: 8/10 due to spasm; resting pain 6/10  Goal status: Ongoing.   5.  Patient will reduce MODI score as to demonstrate minimal disability with ADLs including improved sleeping tolerance, walking/sitting tolerance etc for better mobility with ADLs.  Baseline: 11/26/2022: Patient started ODI but didn't complete back page. Perform next session 1/29 : 38% Goal status: Partially Met  PLAN:  PT FREQUENCY:  2x/week  PT DURATION: 8 weeks  PLANNED INTERVENTIONS: Therapeutic exercises, Therapeutic activity, Neuromuscular re-education, Balance training, Gait training, Patient/Family education, Self Care, Joint mobilization, Joint manipulation, Stair training, Vestibular training, Canalith repositioning, DME instructions, Aquatic Therapy, Dry Needling, Electrical stimulation, Spinal manipulation, Spinal mobilization, Cryotherapy, Moist heat, scar mobilization, Taping, Traction, Ultrasound, Manual therapy, and Re-evaluation.  PLAN FOR NEXT SESSION: Continue HEP, Dry needling if certified to do so, progress core muscle activation and hip strength as pt tolerates. Responded well to Trigger point mobilization with movement.     Particia Lather PT  02/14/2023, 8:04 AM

## 2023-02-14 ENCOUNTER — Encounter: Payer: Self-pay | Admitting: Physical Therapy
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
## 2023-02-18 ENCOUNTER — Ambulatory Visit: Payer: Commercial Managed Care - PPO

## 2023-02-18 DIAGNOSIS — M5459 Other low back pain: Secondary | ICD-10-CM | POA: Diagnosis not present

## 2023-02-18 DIAGNOSIS — M6281 Muscle weakness (generalized): Secondary | ICD-10-CM | POA: Diagnosis not present

## 2023-02-18 DIAGNOSIS — M25551 Pain in right hip: Secondary | ICD-10-CM
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
## 2023-02-21 ENCOUNTER — Other Ambulatory Visit: Payer: Self-pay

## 2023-02-21 ENCOUNTER — Other Ambulatory Visit: Payer: Self-pay | Admitting: Physician Assistant

## 2023-02-21 ENCOUNTER — Other Ambulatory Visit (HOSPITAL_COMMUNITY): Payer: Self-pay

## 2023-02-21 DIAGNOSIS — M0609 Rheumatoid arthritis without rheumatoid factor, multiple sites: Secondary | ICD-10-CM

## 2023-02-21 MED ORDER — OZEMPIC (1 MG/DOSE) 4 MG/3ML ~~LOC~~ SOPN
1.0000 mg | PEN_INJECTOR | SUBCUTANEOUS | 5 refills | Status: DC
  Filled 2023-02-21: qty 3, 28d supply, fill #0
  Filled 2023-06-10: qty 3, 28d supply, fill #1

## 2023-02-21 MED ORDER — RINVOQ 15 MG PO TB24
15.0000 mg | ORAL_TABLET | Freq: Every day | ORAL | 2 refills | Status: DC
  Filled 2023-02-21: qty 30, 30d supply, fill #0
  Filled 2023-04-09: qty 30, 30d supply, fill #1
  Filled 2023-04-29: qty 30, 30d supply, fill #2

## 2023-02-21 NOTE — Telephone Encounter (Signed)
Next Visit: 03/14/2023  Last Visit: 12/10/2022  Last Fill: 12/03/2022  [id number] arthritis of multiple sites with negative rheumatoid factor   Current Dose per office note 12/10/2022: Rinvoq 15 mg 1 tablet by mouth once daily   Labs: 12/05/2022 Hemoglobin 11.7  HCT 34.1  Glucose 172  TB Gold: 10/10/2022 Negative   Okay to refill Rinvoq?

## 2023-02-25 ENCOUNTER — Telehealth: Payer: Self-pay | Admitting: Physician Assistant

## 2023-02-25 ENCOUNTER — Ambulatory Visit: Payer: Commercial Managed Care - PPO

## 2023-02-25 DIAGNOSIS — M6281 Muscle weakness (generalized): Secondary | ICD-10-CM

## 2023-02-25 DIAGNOSIS — M25551 Pain in right hip: Secondary | ICD-10-CM

## 2023-02-25 DIAGNOSIS — M5459 Other low back pain: Secondary | ICD-10-CM | POA: Diagnosis not present
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
## 2023-02-25 NOTE — Telephone Encounter (Signed)
Did you mean right hip?

## 2023-02-25 NOTE — Telephone Encounter (Signed)
Patient sent MyChart message no response "good afternoon [name], I have been going to PT for several weeks now and the back pain is getting better but still have pain. The hip is still causing pain with little relief from PT.  PT suggested I give you an update and see if you wanted to do more imaging.  Thank you,  [name] "

## 2023-02-25 NOTE — Telephone Encounter (Signed)
Gil's response  Hi Nylaya, [employer] of where your message was for 7 days no one can tell me.   It was not where I was able to see it.  Sorry for that.  We can obtain an MRI of your left hip if that is still where you are having most of the pain.  I would be happy to order this and then have you follow-up with [place] to go over the MRI.   Take care , Artis Delay

## 2023-02-25 NOTE — Telephone Encounter (Signed)
Ok ordered

## 2023-02-26 ENCOUNTER — Other Ambulatory Visit (HOSPITAL_COMMUNITY): Payer: Self-pay

## 2023-02-27 ENCOUNTER — Other Ambulatory Visit: Payer: Self-pay

## 2023-02-28 ENCOUNTER — Ambulatory Visit: Payer: Commercial Managed Care - PPO | Admitting: Physical Therapy

## 2023-02-28 NOTE — Progress Notes (Unsigned)
Office Visit Note  Patient: [name]             Date of Birth: Sep 05, 1966           MRN: [id number]             PCP: Michela Pitcher, NP Referring: Michela Pitcher, NP Visit Date: 03/14/2023 Occupation: '@GUAROCC'$ @  Subjective:  Pain in multiple joints   History of Present Illness: [name] is a 57 y.o. female with history of seronegative rheumatoid arthritis and DDD.  Patient is taking Rinvoq 15 mg 1 tablet by mouth once daily and Arava 20 mg 1 tablet by mouth daily.  She is tolerating Rinvoq and Arava without any side effects.  She has not missed any doses recently.  She has not had any recent or recurrent infections.  Patient reports that she continues to have chronic pain involving multiple joints.  She states that her pain level has been severe.  She has also been experiencing significant fatigue on a daily basis.  She states that she has a pain management appointment scheduled next Friday to establish care with Dr. Dagoberto Ligas.  She has not yet had the ultrasound of her hands performed with Dr. Estanislado Pandy and states the appointment is not scheduled until 04/24/23.  She continues to have persistent with in both hands. She states she is also been experiencing significant discomfort in her right hip and lower back.  She completed physical therapy without improvement in her symptoms.  She has been under the care of of orthopedics.  She had an MRI of the right hip on 03/11/2023 which was consistent with osteoarthritis and tendinosis.  She has a follow-up visit scheduled to discuss the next steps in treatment.    Activities of Daily Living:  Patient reports morning stiffness for 1.5 hours.   Patient Reports nocturnal pain.  Difficulty dressing/grooming: Reports Difficulty climbing stairs: Reports Difficulty getting out of chair: Reports Difficulty using hands for taps, buttons, cutlery, and/or writing: Reports  Review of Systems  Constitutional:  Positive for fatigue.  HENT:  Positive  for sore tongue. Negative for mouth sores and mouth dryness.   Eyes:  Positive for dryness.  Respiratory:  Negative for shortness of breath.   Cardiovascular:  Negative for chest pain and palpitations.  Gastrointestinal:  Negative for blood in stool, constipation and diarrhea.  Endocrine: Negative for increased urination.  Genitourinary:  Negative for involuntary urination.  Musculoskeletal:  Positive for joint pain, gait problem, joint pain, joint swelling, myalgias, muscle weakness, morning stiffness, muscle tenderness and myalgias.  Skin:  Positive for color change. Negative for rash, hair loss and sensitivity to sunlight.  Allergic/Immunologic: Positive for susceptible to infections.  Neurological:  Positive for dizziness and headaches.  Hematological:  Negative for swollen glands.  Psychiatric/Behavioral:  Negative for depressed mood and sleep disturbance. The patient is not nervous/anxious.     PMFS History:  Patient Active Problem List   Diagnosis Date Noted   Left foot pain 12/05/2022   Urinary urgency 10/23/2022   Pyelonephritis 10/23/2022   Premature atrial contractions 05/15/2022   PVC's (premature ventricular contractions) 05/15/2022   Low hemoglobin 01/16/2022   Right ear pain 01/09/2022   Sore throat 01/09/2022   OSA on CPAP 08/02/2021   Abnormal MRI, lumbar spine 03/20/2021   Abnormal MRI, thoracic spine 03/20/2021   Chronic mid back pain 03/17/2021   Chronic midline low back pain with left-sided sciatica 03/17/2021   Type 1 diabetes mellitus with diabetic  polyneuropathy ([place]) 02/17/2021   Severe obstructive sleep apnea-hypopnea syndrome 02/17/2021   Chronic diastolic CHF (congestive heart failure) ([place]) 12/19/2020   Chronic back pain 12/19/2020   Chronic abdominal pain [id number]   Acute diastolic heart failure ([place]) 12/09/2020   Diabetic retinopathy associated with type 1 diabetes mellitus ([place]) 09/27/2020   Diabetic retinopathy ([place]) 09/27/2020   Graves'  disease 09/27/2020   Chronic coronary artery disease 09/27/2020   Hypertension associated with diabetes ([place]) 09/14/2020   Obesity (BMI 30-39.9) 09/14/2020   Preventative health care 09/14/2020   Aortic atherosclerosis ([place]) 09/14/2020   Snoring 05/12/2020   Lung nodule 03/10/2020   Colitis 03/10/2020   Type 1 diabetes mellitus with proliferative retinopathy ([place]) 12/10/2019   Abnormal thyroid function test 12/10/2019   Bruxism (teeth grinding)    Chronic migraine 10/07/2019   Contusion of left hip 07/25/2019   H/O multiple allergies 03/10/2019   Hx of anaphylaxis 03/10/2019   Cough 06/26/2018   PND (post-nasal drip) 06/26/2018   Lumbar strain, initial encounter 04/10/2018   Thoracic myofascial strain 04/10/2018   Vitamin D deficiency 07/04/2017   B12 deficiency 07/04/2017   Abnormal CT scan 06/19/2017   Gastroesophageal reflux disease 06/19/2017   Antiplatelet or antithrombotic long-term use 06/19/2017   Nasal congestion 03/15/2017   Graves disease 02/01/2017   Sleep difficulties 02/01/2017   No energy 02/01/2017   Osteopenia 02/01/2017   Bilateral hand pain 07/16/2016   Acquired trigger finger 07/16/2016   Hypertension, essential 05/08/2016   Foot pain, right 08/30/2014   Shoulder pain, right 08/30/2014   Chronic pain syndrome 01/15/2014   Multiple joint pain 01/15/2014   Lesion of lower eyelid 04/21/2013   Generalized constriction of visual field 03/31/2013   Neoplasm of uncertain behavior of skin of eyelid 03/31/2013   Posterior capsular opacification 03/31/2013   Cyst of left lower eyelid 03/30/2013   Pseudophakia of both eyes 03/30/2013   Prurigo nodularis 02/05/2013   Stasis dermatitis 02/05/2013   Psoriasis 01/21/2013   Trochanteric bursitis 11/10/2012   Achilles tendinitis 07/11/2012   Epiretinal membrane 06/17/2012   Status post cataract extraction 04/30/2012   Pes equinus, acquired 04/23/2012   Tendinitis of ankle 04/23/2012   Proliferative diabetic  retinopathy of both eyes ([place]) 01/11/2012   Ongoing use of possibly toxic medication 12/05/2011   Hyperthyroidism 11/09/2010   SINUS TACHYCARDIA 11/08/2010   DERMATITIS, ALLERGIC 07/20/2010   EDEMA 07/12/2010   Dizziness 11/14/2009   ATRIAL FIBRILLATION 07/20/2009   Hx of CABG 06/07/2009   ANGINA, STABLE/EXERTIONAL 06/02/2009   Dyslipidemia, goal LDL below 70 04/26/2009   ACUT MI ANTEROLAT WALL SUBSQT EPIS CARE 04/26/2009   Chest pain 04/26/2009   DIABETIC  RETINOPATHY 04/25/2009   CARPAL TUNNEL SYNDROME, BILATERAL 04/25/2009   TRIGGER FINGER 04/25/2009   MIGRAINE W/O AURA W/INTRACT W/STATUS MIGRAINOSUS 02/19/2008   ALLERGIC RHINITIS, SEASONAL 10/16/2006   CHOLELITHIASIS 10/16/2006   Rheumatoid arthritis ([place]) 10/16/2006    Past Medical History:  Diagnosis Date   ACUT MI ANTEROLAT WALL SUBSQT EPIS CARE    Acute maxillary sinusitis    ALLERGIC RHINITIS, SEASONAL    Anemia    ARTHRITIS, RHEUMATOID    shoulders and hands Enbrel>leg swelling Dr. Estanislado Pandy   Atrial fibrillation [facility])    a. after CABG.   Back pain    Bruxism (teeth grinding)    CAD, ARTERY BYPASS GRAFT    a. DES to RCA in 2010 then LAD occlusion s/p CABG 3 06/07/2009 with LIMA to LAD, reverse SVG to D1, reverse SVG  to distal RCA. b. Cath 05/08/2016 slightly hypodense region in the intermediate branch, however she had excellent flow, FFR was normal. Vein graft to PDA and the posterolateral branch is patent, patent LIMA to LAD, occluded SVG to diagonal.   CARPAL TUNNEL SYNDROME, BILATERAL    CHOLELITHIASIS    Contrast media allergy    DERMATITIS, ALLERGIC    DIABETES MELLITUS, TYPE I    on insulin pump dx'ed age 17 y.o    DIABETIC  RETINOPATHY    Hiatal hernia    HYPERLIPIDEMIA-MIXED    HYPERTHYROIDISM    Dr. Dollene Cleveland   IDA (iron deficiency anemia)    Insulin pump in place    Lymphadenopathy of head and neck    MIGRAINE W/O AURA W/INTRACT W/STATUS MIGRAINOSUS 02/19/2008   PONV (postoperative nausea and  vomiting)    Psoriasis    SINUS TACHYCARDIA 11/08/2010   Sleep apnea    not using machine yet    SVT (supraventricular tachycardia)    after s/p CABG   Tendonitis    TRIGGER FINGER    all fingers b/l hands    URI     Family History  Problem Relation Age of Onset   Depression Mother    Anxiety disorder Mother    Colon polyps Father    Diabetes Father        2   Hypertension Father    Parkinson's disease Father    Stroke Paternal Grandmother    Heart disease Other    Hypertension Other    Hyperlipidemia Other    Depression Other    Migraines Other    Heart attack Neg Hx    Colon cancer Neg Hx    Stomach cancer Neg Hx    Esophageal cancer Neg Hx    Past Surgical History:  Procedure Laterality Date   ABDOMINAL HYSTERECTOMY     endometriomas b/l; total ? cervix removed; no h/o abnormal pap   Caesarean section     CARDIAC CATHETERIZATION N/A 05/08/2016   Procedure: Left Heart Cath and Cors/Grafts Angiography;  Surgeon: Burnell Blanks, MD;  Location: [place] [facility];  Service: Cardiovascular;  Laterality: N/A;   CARPAL TUNNEL RELEASE     b/l    CARPAL TUNNEL RELEASE Left 06/06/2021   Procedure: CARPAL TUNNEL RELEASE;  Surgeon: Daryll Brod, MD;  Location: [place];  Service: Orthopedics;  Laterality: Left;  AXILLARY BLOCK   CATARACT EXTRACTION, BILATERAL     CHOLECYSTECTOMY     COLONOSCOPY WITH PROPOFOL N/A 03/25/2020   Procedure: COLONOSCOPY WITH PROPOFOL;  Surgeon: Jonathon Bellows, MD;  [facility] END[facility];  Service: Gastroenterology;  Laterality: N/A;   CORONARY ARTERY BYPASS GRAFT     ESOPHAGOGASTRODUODENOSCOPY (EGD) WITH PROPOFOL N/A 03/25/2020   Procedure: ESOPHAGOGASTRODUODENOSCOPY (EGD) WITH PROPOFOL;  Surgeon: Jonathon Bellows, MD;  Location: [facility] ENDOSCOPY;  Service: Gastroenterology;  Laterality: N/A;   EYE SURGERY     laser x 2 for retinopathy    LEFT HEART CATH AND CORS/GRAFTS ANGIOGRAPHY N/A 02/22/2020   Procedure: LEFT HEART CATH AND  CORS/GRAFTS ANGIOGRAPHY;  Surgeon: Burnell Blanks, MD;  Location: [place] [facility];  Service: Cardiovascular;  Laterality: N/A;   TRIGGER FINGER RELEASE     b/l fingers all    ULNAR NERVE TRANSPOSITION Left 06/06/2021   Procedure: ULNAR NERVE DECOMPRESSION;  Surgeon: Daryll Brod, MD;  Location: [place];  Service: Orthopedics;  Laterality: Left;  AXILLARY BLOCK   VITRECTOMY     b/l  Social History   Social History Narrative   Divorced. Has 2 kids(fraternal twins) daughters age 50. Works at  [employer], Never smoked, denies ETOH, no drugs. Drinks diet coke. No exercise.       DPR daughters [name] and Marcilla Staber twins          Immunization History  Administered Date(s) Administered   Influenza Inj Mdck Quad With Preservative 09/30/2014   Influenza, Quadrivalent, Recombinant, Inj, Pf 10/14/2013   Influenza, Seasonal, Injecte, Preservative Fre 09/19/2016   Influenza,inj,Quad PF,6+ Mos 09/30/2014, 09/23/2017, 09/03/2018, 10/02/2022   Influenza,inj,quad, With Preservative 09/23/2017   Influenza-Unspecified 09/30/2014, 09/30/2016, 09/03/2018, 09/10/2019, 09/28/2020, 09/28/2021   PFIZER(Purple Top)SARS-COV-2 Vaccination 12/28/2019, 01/18/2020   Pneumococcal Conjugate-13 07/16/2016   Pneumococcal Polysaccharide-23 01/01/2004, 10/15/2006, 01/04/2020   Td 10/01/2003   Tdap 01/09/2011, 06/25/2018   Zoster Recombinat (Shingrix) 09/14/2020, 12/15/2020     Objective: Vital Signs: BP 131/72 (BP Location: Left Arm, Patient Position: Sitting, Cuff Size: Normal)   Pulse 78   Resp 15   Ht '5\' 3"'$  (1.6 m)   Wt 178 lb (80.7 kg)   BMI 31.53 kg/m    Physical Exam Vitals and nursing note reviewed.  Constitutional:      Appearance: She is well-developed.  HENT:     Head: Normocephalic and atraumatic.  Eyes:     Conjunctiva/sclera: Conjunctivae normal.  Cardiovascular:     Rate and Rhythm: Normal rate and regular rhythm.     Heart sounds: Normal heart sounds.   Pulmonary:     Effort: Pulmonary effort is normal.     Breath sounds: Normal breath sounds.  Abdominal:     General: Bowel sounds are normal.     Palpations: Abdomen is soft.  Musculoskeletal:     Cervical back: Normal range of motion.  Skin:    General: Skin is warm and dry.     Capillary Refill: Capillary refill takes less than 2 seconds.  Neurological:     Mental Status: She is alert and oriented to person, place, and time.  Psychiatric:        Behavior: Behavior normal.      Musculoskeletal Exam: Generalized hyperalgesia and positive tender points on exam.  C-spine has painful limited range of motion especially lateral rotation.  Painful range of motion of the lumbar spine.  Shoulder joints have discomfort and stiffness bilaterally.  Elbow joints have good range of motion with no tenderness or synovitis.  Tenderness over both wrist joints.  Tenderness over several MCP and PIP joints but no obvious synovitis was noted.  Complete fist formation noted bilaterally.  PIP and DIP thickening noted.  Painful range of motion of both hip joints, right greater than left.  Knee joints have good range of motion with some discomfort in the right knee.  Ankle joints have good range of motion with no tenderness or joint swelling.  CDAI Exam: CDAI Score: 10.2  Patient Global: 6 mm; Provider Global: 6 mm Swollen: 0 ; Tender: 10  Joint Exam 03/14/2023      Right  Left  Glenohumeral   Tender   Tender  Elbow   Tender     Wrist   Tender   Tender  MCP 1   Tender     MCP 2      Tender  MCP 4   Tender     MCP 5   Tender     Hip   Tender        Investigation: No additional findings.  Imaging:  MR Hip Right w/o contrast  Result Date: 03/13/2023 CLINICAL DATA:  Right hip pain for 6 months.  No known injury. EXAM: MR OF THE RIGHT HIP WITHOUT CONTRAST TECHNIQUE: Multiplanar, multisequence MR imaging was performed. No intravenous contrast was administered. COMPARISON:  None Available. FINDINGS:  Bones: No fracture, stress change or worrisome lesion is identified. No subchondral cyst formation or edema about the hips. No avascular necrosis of the femoral heads. Articular cartilage and labrum Articular cartilage:  Minimally degenerated. Labrum: The superior labrum appears mildly frayed but no tear is identified. Joint or bursal effusion Joint effusion:  None. Bursae: Negative. Muscles and tendons Muscles and tendons: Intact. Mildly to moderately increased T2 signal at the hamstring origins bilaterally is compatible with tendinosis and worse on the left. Other findings Miscellaneous:   The patient is status post hysterectomy. IMPRESSION: 1. Mild appearing right hip osteoarthritis. 2. Mild to moderate appearing bilateral hamstring origin tendinosis without tear appears worse on the left. Electronically Signed   By: Inge Rise M.D.   On: 03/13/2023 11:55    Recent Labs: Lab Results  Component Value Date   WBC 4.0 12/05/2022   HGB 11.7 (L) 12/05/2022   PLT 231.0 12/05/2022   NA 138 12/05/2022   K 3.7 12/05/2022   CL 100 12/05/2022   CO2 30 12/05/2022   GLUCOSE 172 (H) 12/05/2022   BUN 13 12/05/2022   CREATININE 0.92 12/05/2022   BILITOT 0.3 12/05/2022   ALKPHOS 66 12/05/2022   AST 21 12/05/2022   ALT 15 12/05/2022   PROT 6.4 12/05/2022   ALBUMIN 4.5 12/05/2022   CALCIUM 8.7 12/05/2022   GFRAA 86 04/07/2021   QFTBGOLDPLUS NEGATIVE 10/31/2021    Speciality Comments: TB Gold: 10/10/2022 Neg  PLQ Eye Exam: 01/06/2020 ABNORMAL @ [facility]. Patient advised to discontinue PLQ at office visit on 01/06/2020.  Prior Therapy: methotrexate/Xeljanz/Actemra (GI upset), Enbrel (injection site reaction) and Orencia/Remicade/Humira/Cimzia/Rituxan/Simponi/Simponi Aria (inadequate response).  Procedures:  No procedures performed Allergies: Actemra [tocilizumab], Ramipril, Shellfish-derived products, Atorvastatin, Compazine  [prochlorperazine edisylate], Emgality  [galcanezumab-gnlm], Etanercept, Infliximab, Iohexol, Orencia [abatacept], Prochlorperazine edisylate, Tofacitinib, Trokendi xr [topiramate er], Tramadol, Amiodarone, and Rituximab       Assessment / Plan:     Visit Diagnoses: Rheumatoid arthritis of multiple sites with negative rheumatoid factor (HCC) - Methotrexate/Xeljanz/Actemra (GI upset), Enbrel (injection site reaction) and Orencia/Remicade/Humira/Cimzia/Rituxan/Simponi/Simponi Aria (inadequate response): ESR and CRP were within normal limits on 02/10/2022.  She has had persistent pain involving multiple joints since her last office visit.  She remains on Rinvoq 15 mg 1 tablet by mouth daily and Arava 20 mg 1 tablet by mouth daily.  She has been tolerating combination therapy without any side effects and has not had any gaps in therapy.  She has been having severe pain in both hands and both wrist joints as well as in the right hip joint.  She had an MRI of the right hip on 03/10/2022 which did not reveal any synovitis or a joint effusion-findings were consistent with mild osteoarthritis.  An ultrasound of both hands was performed today to assess for synovitis.  Findings were negative for synovitis and tenosynovitis which was reassuring.  Patient was encouraged to remain on Rinvoq and [place] as prescribed.  She will be establishing care with pain management on 03/22/2023.  Bilateral hand pain - ESR and CRP WNL on 12/10/22.  Ultrasound of both hands was performed today which was negative for synovitis or tenosynovitis. Plan to continue on Rinvoq and [place]  as prescribed.  Plan: [place] LIMITED JOINT SPACE STRUCTURES UP BILAT  High risk medication use - Rinvoq 15 mg 1 tablet by mouth once daily and Arava 20 mg 1 tablet by mouth daily.  TB gold negative on 10/13/22.  Lipid panel WNL on 12/05/22.  Cbc and CMP updated on 12/05/22. Orders for CBC and CMP released today.  Her next lab work will be due in June and every 3 months.  No recent or recurrent  infections.  Discussed the importance of holding rinvoq and arava if she develops signs or symptoms of an infection and to resume once the infection has completely cleared.  - Plan: CBC with Differential/Platelet, COMPLETE METABOLIC PANEL WITH GFR  Psoriasis: No active psoriasis noted on examination today.  Contracture of joint of both elbows: Unchanged.  No active inflammation noted.  Right hip pain: Under the care of orthopedics.  Completed PT with minimal to no improvement in her symptoms.  MRI of the right hip on 03/11/2023: Mild appearing right hip osteoarthritis.  Mild to moderate appearing bilateral hamstring origin tendinosis without tear. Persistent pain.  Scheduled to follow-up at Ortho care on 03/21/2023.  Upcoming appointment scheduled at pain management on 03/22/2023.  Chronic pain of right knee: Discomfort with range of motion.  No warmth or effusion noted today.  DDD (degenerative disc disease), thoracic: Chronic pain.   DDD (degenerative disc disease), lumbar - She had a MRI of the lumbar spine on 04/02/21: Mild canal stenosis at L3-L4 and L4-L5, slightly progressed. Chronic pain.  Scheduled to establish care with Dr. Dagoberto Ligas on 03/22/23.  A refill of Flexeril 10 mg 1 tablet at bedtime as needed for muscle spasms was sent to the pharmacy today.  Osteopenia of multiple sites - DEXA on 11/11/19: The BMD measured at Femur Neck Left is 0.901 g/cm2 with a T-score of -1.0-Normal.   Due to update DEXA in November 2025  Other fatigue -She has been experiencing significant fatigue on a daily basis.  She has been having interrupted sleep at night due to nocturnal pain.  She has been having difficulty performing her responsibilities at work due to the severity of fatigue.  Her seems to be secondary to chronic pain syndrome.  She has an upcoming appointment with pain management scheduled to establish care.  Patient has requested to have updated TSH at today.  Plan: TSH  Other insomnia: She has been  having difficulty sleeping at night due to nocturnal pain.  Patient requested a refill of Flexeril 10 mg 1 tablet at bedtime to be sent to the pharmacy.  History of vitamin D deficiency  History of gastroesophageal reflux (GERD)  History of Graves' disease: Patient requested to have TSH updated today.  History of coronary artery disease  History of diabetes mellitus  History of hyperlipidemia  History of hypertension: Blood pressure was 131/72 today in the office.  History of migraine    Orders: Orders Placed This Encounter  Procedures   [place] LIMITED JOINT SPACE STRUCTURES UP BILAT   CBC with Differential/Platelet   COMPLETE METABOLIC PANEL WITH GFR   TSH   Meds ordered this encounter  Medications   cyclobenzaprine (FLEXERIL) 10 MG tablet    Sig: Take 1 tablet (10 mg total) by mouth at bedtime as needed for muscle spasms.    Dispense:  30 tablet    Refill:  0     Follow-Up Instructions: Return in about 3 months (around 06/14/2023) for Rheumatoid arthritis, DDD.   Ofilia Neas, PA-C  Note - This record has been created using Dragon software.  Chart creation errors have been sought, but may not always  have been located. Such creation errors do not reflect on  the standard of medical care.  

## 2023-02-28 NOTE — Progress Notes (Unsigned)
Cardiology Office Note:    Date:  03/06/2023   ID:  Telesia, Wehunt 1966-03-15, MRN [id number]  PCP:  Michela Pitcher, NP   [facility] Providers Cardiologist:  Sherren Mocha, MD     Referring MD: Michela Pitcher, NP   Chief Complaint: follow-up CAD  History of Present Illness:    [name] is a very pleasant 57 y.o. female with a hx of CAD s/p CABG x 3, type 1 diabetes, HTN, Graves' disease, HLD, rheumatoid arthritis, GERD, and chronic anemia.   She initially presented with an inferior wall MI treated with PCI of the right coronary artery. Ultimately was treated with multivessel CABG in 2010 in the setting of chronically occluded LAD.  Last cardiology clinic visit was 03/01/2022 with Dr. Burt Knack.  At that time she reported generalized fatigue. History of problems with edema, however it completely resolved when she was switched from furosemide to torsemide. Twin daughters had recently graduated from early college at age of 15, one considering medical school. No adjustments were made to treatment regimen and one year follow-up was advised.  She contacted our office 04/22/22 to report elevated HR with minimal activity.  Metoprolol was increased to 50 mg twice daily. 3 day Zio revealed basic rhythm normal sinus rhythm with an average HR 76 bpm, rare PVCs and PACs, no sustained arrhythmias. She reported feelings of unable to catch her breath and chest heaviness.  She underwent exercise Myoview on 05/21/2022 that revealed normal perfusion, normal LV function.  Today, she is here alone for follow-up.Reports she feels fatigued every day at about 2 pm. She works 7a - 3p at [employer] in [place]. Reports she cannot pinpoint a specific time when this started to feel more fatigued but "is tired of being tired." Does not correlate increased fatigue with timing of increasing metoprolol dose specifically. Has history of chronic anemia, has been on iron in the past but is not on it currently.  She denies  chest pain, shortness of breath, edema, PND, orthopnea, presyncope, syncope. No palpitations since being on higher dose of metoprolol. Has bilateral LE edema that improves over night. Blood sugar has been elevated recently, has soon appointment with those providers.   Past Medical History:  Diagnosis Date   ACUT MI ANTEROLAT WALL SUBSQT EPIS CARE    Acute maxillary sinusitis    ALLERGIC RHINITIS, SEASONAL    Anemia    ARTHRITIS, RHEUMATOID    shoulders and hands Enbrel>leg swelling Dr. Estanislado Pandy   Atrial fibrillation [facility])    a. after CABG.   Back pain    Bruxism (teeth grinding)    CAD, ARTERY BYPASS GRAFT    a. DES to RCA in 2010 then LAD occlusion s/p CABG 3 06/07/2009 with LIMA to LAD, reverse SVG to D1, reverse SVG to distal RCA. b. Cath 05/08/2016 slightly hypodense region in the intermediate branch, however she had excellent flow, FFR was normal. Vein graft to PDA and the posterolateral branch is patent, patent LIMA to LAD, occluded SVG to diagonal.   CARPAL TUNNEL SYNDROME, BILATERAL    CHOLELITHIASIS    Contrast media allergy    DERMATITIS, ALLERGIC    DIABETES MELLITUS, TYPE I    on insulin pump dx'ed age 89 y.o    DIABETIC  RETINOPATHY    Hiatal hernia    HYPERLIPIDEMIA-MIXED    HYPERTHYROIDISM    Dr. Dollene Cleveland   IDA (iron deficiency anemia)    Insulin pump in place  Lymphadenopathy of head and neck    MIGRAINE W/O AURA W/INTRACT W/STATUS MIGRAINOSUS 02/19/2008   PONV (postoperative nausea and vomiting)    Psoriasis    SINUS TACHYCARDIA 11/08/2010   Sleep apnea    not using machine yet    SVT (supraventricular tachycardia)    after s/p CABG   TRIGGER FINGER    all fingers b/l hands    URI     Past Surgical History:  Procedure Laterality Date   ABDOMINAL HYSTERECTOMY     endometriomas b/l; total ? cervix removed; no h/o abnormal pap   Caesarean section     CARDIAC CATHETERIZATION N/A 05/08/2016   Procedure: Left Heart Cath and Cors/Grafts Angiography;   Surgeon: Burnell Blanks, MD;  Location: [place] [facility];  Service: Cardiovascular;  Laterality: N/A;   CARPAL TUNNEL RELEASE     b/l    CARPAL TUNNEL RELEASE Left 06/06/2021   Procedure: CARPAL TUNNEL RELEASE;  Surgeon: Daryll Brod, MD;  Location: [place];  Service: Orthopedics;  Laterality: Left;  AXILLARY BLOCK   CATARACT EXTRACTION, BILATERAL     CHOLECYSTECTOMY     COLONOSCOPY WITH PROPOFOL N/A 03/25/2020   Procedure: COLONOSCOPY WITH PROPOFOL;  Surgeon: Jonathon Bellows, MD;  Location[facility] END[facility];  Service: Gastroenterology;  Laterality: N/A;   CORONARY ARTERY BYPASS GRAFT     ESOPHAGOGASTRODUODENOSCOPY (EGD) WITH PROPOFOL N/A 03/25/2020   Procedure: ESOPHAGOGASTRODUODENOSCOPY (EGD) WITH PROPOFOL;  Surgeon: Jonathon Bellows, MD;  Location: [facility] ENDOSCOPY;  Service: Gastroenterology;  Laterality: N/A;   EYE SURGERY     laser x 2 for retinopathy    LEFT HEART CATH AND CORS/GRAFTS ANGIOGRAPHY N/A 02/22/2020   Procedure: LEFT HEART CATH AND CORS/GRAFTS ANGIOGRAPHY;  Surgeon: Burnell Blanks, MD;  Location: [place] [facility];  Service: Cardiovascular;  Laterality: N/A;   TRIGGER FINGER RELEASE     b/l fingers all    ULNAR NERVE TRANSPOSITION Left 06/06/2021   Procedure: ULNAR NERVE DECOMPRESSION;  Surgeon: Daryll Brod, MD;  Location: [place];  Service: Orthopedics;  Laterality: Left;  AXILLARY BLOCK   VITRECTOMY     b/l     Current Medications: Current Meds  Medication Sig   albuterol (VENTOLIN HFA) 108 (90 Base) MCG/ACT inhaler Inhale 2 puffs into the lungs every 4 (four) hours as needed for wheezing or shortness of breath.   aspirin EC 81 MG tablet Take 81 mg by mouth daily.   Blood Glucose Monitoring Suppl (FREESTYLE FREEDOM LITE) w/Device KIT Use as directed by physician to check blood sugar   CHELATED MAGNESIUM PO Take 250 mg by mouth daily.   clopidogrel (PLAVIX) 75 MG tablet TAKE 1 TABLET BY MOUTH ONCE DAILY   Continuous Blood Gluc  Sensor (DEXCOM G6 SENSOR) MISC Dispense and use as directed.  Change sensor every 10 days.   Continuous Blood Gluc Transmit (DEXCOM G6 TRANSMITTER) MISC Dispense and use as directed.  Change transmitter every 90 days.   EPINEPHrine 0.3 mg/0.3 mL IJ SOAJ injection Inject 0.3 mg into the muscle as needed for anaphylaxis.   glucose blood (FREESTYLE LITE) test strip Use to check blood sugar 3 time(s) daily   insulin detemir (LEVEMIR) 100 UNIT/ML injection Inject 36 units into the skin once daily in the event of insulin pump failure.   insulin lispro (HUMALOG) 100 UNIT/ML injection Inject 0.9 mLs (90 Units total) into the skin daily in insulin pump.   Insulin Syringe-Needle U-100 (BD INSULIN SYRINGE U/F) 31G X 5/16" 0.3 ML MISC  use with insulin injections 3 times daily in the event of insulin pump failure.   isosorbide mononitrate (IMDUR) 60 MG 24 hr tablet TAKE 1 TABLET BY MOUTH DAILY.   ketoconazole (NIZORAL) 2 % shampoo Apply 1 Application topically 3 (three) times a week. Wash scalp 3 times weekly, leave in for 10 minutes then rinse out   Krill Oil 500 MG CAPS Take 500 mg by mouth daily.   Lancets (FREESTYLE) lancets Use to check blood sugar 3 time(s) daily.   leflunomide (ARAVA) 20 MG tablet Take 1 tablet (20 mg total) by mouth daily.   linaclotide (LINZESS) 290 MCG CAPS capsule Take 1 capsule (290 mcg total) by mouth daily before breakfast.   metoprolol tartrate (LOPRESSOR) 50 MG tablet Take 0.5 tablets (25 mg total) by mouth 2 (two) times daily.   mupirocin ointment (BACTROBAN) 2 % Apply 1 Application topically as directed. Bid to tid to open sores in scalp   ondansetron (ZOFRAN-ODT) 4 MG disintegrating tablet Take 4 mg by mouth every 8 (eight) hours as needed.   pantoprazole (PROTONIX) 40 MG tablet Take 1 tablet (40 mg total) by mouth 2 (two) times daily.   rosuvastatin (CRESTOR) 20 MG tablet Take 1 tablet (20 mg total) by mouth daily.   Semaglutide, 1 MG/DOSE, (OZEMPIC, 1 MG/DOSE,) 4 MG/3ML  SOPN Inject 1 mg into the skin once a week.   torsemide (DEMADEX) 20 MG tablet Take 1 tablet (20 mg total) by mouth daily.   Upadacitinib ER (RINVOQ) 15 MG TB24 Take 1 tablet (15 mg) by mouth daily.   [DISCONTINUED] metoprolol tartrate (LOPRESSOR) 50 MG tablet Take 50 mg by mouth 2 (two) times daily.   [DISCONTINUED] potassium chloride (KLOR-CON) 10 MEQ tablet Take 1 tablet (10 mEq total) by mouth daily.     Allergies:   Actemra [tocilizumab], Ramipril, Shellfish-derived products, Atorvastatin, Compazine  [prochlorperazine edisylate], Emgality [galcanezumab-gnlm], Etanercept, Infliximab, Iohexol, Orencia [abatacept], Prochlorperazine edisylate, Tofacitinib, Trokendi xr [topiramate er], Tramadol, Amiodarone, and Rituximab   Social History   Socioeconomic History   Marital status: Divorced    Spouse name: Not on file   Number of children: Not on file   Years of education: Not on file   Highest education level: Not on file  Occupational History   Not on file  Tobacco Use   Smoking status: Never    Passive exposure: Never   Smokeless tobacco: Never  Vaping Use   Vaping Use: Never used  Substance and Sexual Activity   Alcohol use: Yes    Comment: rarely 1 every 6 months   Drug use: No   Sexual activity: Not Currently    Partners: Male    Birth control/protection: None  Other Topics Concern   Not on file  Social History Narrative   Divorced. Has 2 kids(fraternal twins) daughters age 41. Works at  [employer], Never smoked, denies ETOH, no drugs. Drinks diet coke. No exercise.       DPR daughters [name] and [name] Reckling twins          Social Determinants of Health   Financial Resource Strain: Not on file  Food Insecurity: Not on file  Transportation Needs: Not on file  Physical Activity: Not on file  Stress: Not on file  Social Connections: Not on file     Family History: The patient's family history includes Anxiety disorder in her mother; Colon polyps in her father;  Depression in her mother and another family member; Diabetes in her father; Heart disease in  an other family member; Hyperlipidemia in an other family member; Hypertension in her father and another family member; Migraines in an other family member; Parkinson's disease in her father; Stroke in her paternal grandmother. There is no history of Heart attack, Colon cancer, Stomach cancer, or Esophageal cancer.  ROS:   Please see the history of present illness.    + fatigue All other systems reviewed and are negative.  Labs/Other Studies Reviewed:    The following studies were reviewed today:  Exercise Myoview 05/21/22   Exercise stress test   Clinically and electrically negative for ischemia   Myoview scan shows normal perfusion.  No ischemia   Left ventricular function is normal. Nuclear stress EF: 71 %. The left ventricular ejection fraction is hyperdynamic (>65%). End diastolic cavity size is normal.   Prior study available for comparison from 06/06/2018.  Unchanged    Cardiac monitor 05/14/2022 Patient had a min HR of 60 bpm, max HR of 110 bpm, and avg HR of 76 bpm. Predominant underlying rhythm was Sinus Rhythm. Isolated SVEs were rare (<1.0%), and no SVE Couplets or SVE Triplets were present. Isolated VEs were rare (<1.0%), and no VE Couplets  or VE Triplets were present.   SUMMARY: The basic rhythm is normal sinus with an average heart rate of 76 bpm.  There are rare PVCs and PACs.  There were no sustained arrhythmias.  Benign heart monitor result.    Echo 01/26/21  1. Left ventricular ejection fraction, by estimation, is 60 to 65%. The  left ventricle has normal function. The left ventricle has no regional  wall motion abnormalities. Left ventricular diastolic parameters were  normal.   2. Right ventricular systolic function is normal. The right ventricular  size is normal.   3. The mitral valve is normal in structure. No evidence of mitral valve  regurgitation. No evidence of mitral  stenosis.   4. The aortic valve was not well visualized. Aortic valve regurgitation  is not visualized.   5. The inferior vena cava is normal in size with greater than 50%  respiratory variability, suggesting right atrial pressure of 3 mmHg.   Recent Labs: 12/05/2022: ALT 15; BUN 13; Creatinine, Ser 0.92; Hemoglobin 11.7; Platelets 231.0; Potassium 3.7; Sodium 138; TSH 1.02  Recent Lipid Panel    Component Value Date/Time   CHOL 133 12/05/2022 1558   TRIG 95.0 12/05/2022 1558   HDL 62.40 12/05/2022 1558   CHOLHDL 2 12/05/2022 1558   VLDL 19.0 12/05/2022 1558   LDLCALC 52 12/05/2022 1558     Risk Assessment/Calculations:         Physical Exam:    VS:  BP 124/60   Pulse 69   Ht '5\' 3"'$  (1.6 m)   Wt 175 lb 12.8 oz (79.7 kg)   SpO2 98%   BMI 31.14 kg/m     Wt Readings from Last 3 Encounters:  03/05/23 175 lb 12.8 oz (79.7 kg)  12/10/22 170 lb 3.2 oz (77.2 kg)  12/05/22 170 lb (77.1 kg)     GEN:  Well nourished, well developed in no acute distress HEENT: Normal NECK: No JVD; No carotid bruits CARDIAC: RRR, no murmurs, rubs, gallops RESPIRATORY:  Clear to auscultation without rales, wheezing or rhonchi  ABDOMEN: Soft, non-tender, non-distended MUSCULOSKELETAL:  No edema; No deformity. 2+ pedal pulses, equal bilaterally SKIN: Warm and dry NEUROLOGIC:  Alert and oriented x 3 PSYCHIATRIC:  Normal affect   EKG:  EKG is ordered today.  The ekg ordered today demonstrates  normal sinus rhythm at 65 bpm, low voltage QRS, QR pattern in V1 to suggest right ventricular conduction delay, no acute change from previous  Diagnoses:    1. Coronary artery disease involving native coronary artery of native heart without angina pectoris   2. Hyperlipidemia LDL goal <70   3. Palpitations   4. Chronic fatigue   5. Essential hypertension    Assessment and Plan:     Fatigue: Ongoing fatigue for approximately 1 year. History of chronic anemia and  type 1 DM. Blood sugar has been  elevated recently. She has follow-up appointments with these providers soon. Will trial lowering her dose of metoprolol to 25 mg BID to see if this improves fatigue. Advised her to resume 50 mg BID if palpitations or chest pain worsen on lower dose.   Palpitations: Quiescent at this time. Continue beta blocker.   CAD without angina: s/p CABG 2010. Normal perfusion, LV function on NST 05/21/22. She denies chest pain, dyspnea, or other symptoms concerning for angina. No indication for further ischemic evaluation at this time. No bleeding concerns. Continue GDMT including aspirin, Imdur, torsemide, rosuvastatin, clopidogrel.   Hyperlipidemia LDL goal < 55: LDL 52 on 12/05/22  Hypertension: BP is well controlled. Advised her to continue to monitor on lower dose of metoprolol.      Disposition: 6 months with Dr. Burt Knack  Medication Adjustments/Labs and Tests Ordered: Current medicines are reviewed at length with the patient today.  Concerns regarding medicines are outlined above.  Orders Placed This Encounter  Procedures   EKG 12-Lead   Meds ordered this encounter  Medications   metoprolol tartrate (LOPRESSOR) 50 MG tablet    Sig: Take 0.5 tablets (25 mg total) by mouth 2 (two) times daily.    Dispense:  90 tablet    Refill:  3    Patient Instructions  Medication Instructions:   DECREASE Metoprolol one half (0.5) tablet by mouth (25 mg) twice daily.  You can go back to ( 50 mg) twice daily if needed.   *If you need a refill on your cardiac medications before your next appointment, please call your pharmacy*   Lab Work:  None ordered.  If you have labs (blood work) drawn today and your tests are completely normal, you will receive your results only by: [place] (if you have MyChart) OR A paper copy in the mail If you have any lab test that is abnormal or we need to change your treatment, we will call you to review the results.   Testing/Procedures:  None  ordered.   Follow-Up: At [facility], you and your health needs are our priority.  As part of our continuing mission to provide you with exceptional heart care, we have created designated Provider Care Teams.  These Care Teams include your primary Cardiologist (physician) and Advanced Practice Providers (APPs -  Physician Assistants and Nurse Practitioners) who all work together to provide you with the care you need, when you need it.  We recommend signing up for the patient portal called "MyChart".  Sign up information is provided on this After Visit Summary.  MyChart is used to connect with patients for Virtual Visits (Telemedicine).  Patients are able to view lab/test results, encounter notes, upcoming appointments, etc.  Non-urgent messages can be sent to your provider as well.   To learn more about what you can do with MyChart, go to [email or web address].    Your next appointment:   6 month(s)  Provider:   Legrand Como  Burt Knack, MD       Signed, Ann Maki, Lanice Schwab, NP  03/06/2023 6:49 AM    [place]

## 2023-03-04 ENCOUNTER — Ambulatory Visit: Payer: Commercial Managed Care - PPO

## 2023-03-05 ENCOUNTER — Encounter: Payer: Self-pay | Admitting: Nurse Practitioner

## 2023-03-05 ENCOUNTER — Ambulatory Visit: Payer: Commercial Managed Care - PPO | Attending: Nurse Practitioner | Admitting: Nurse Practitioner

## 2023-03-05 VITALS — BP 124/60 | HR 69 | Ht 63.0 in | Wt 175.8 lb

## 2023-03-05 DIAGNOSIS — E785 Hyperlipidemia, unspecified: Secondary | ICD-10-CM

## 2023-03-05 DIAGNOSIS — I2510 Atherosclerotic heart disease of native coronary artery without angina pectoris: Secondary | ICD-10-CM

## 2023-03-05 DIAGNOSIS — R5382 Chronic fatigue, unspecified: Secondary | ICD-10-CM

## 2023-03-05 DIAGNOSIS — R002 Palpitations: Secondary | ICD-10-CM | POA: Diagnosis not present

## 2023-03-05 DIAGNOSIS — I10 Essential (primary) hypertension: Secondary | ICD-10-CM

## 2023-03-05 MED ORDER — METOPROLOL TARTRATE 50 MG PO TABS: 25.0000 mg | ORAL_TABLET | Freq: Two times a day (BID) | ORAL | 3 refills | Status: DC

## 2023-03-05 NOTE — Patient Instructions (Signed)
Medication Instructions:   DECREASE Metoprolol one half (0.5) tablet by mouth (25 mg) twice daily.  You can go back to ( 50 mg) twice daily if needed.   *If you need a refill on your cardiac medications before your next appointment, please call your pharmacy*   Lab Work:  None ordered.  If you have labs (blood work) drawn today and your tests are completely normal, you will receive your results only by: [place] (if you have MyChart) OR A paper copy in the mail If you have any lab test that is abnormal or we need to change your treatment, we will call you to review the results.   Testing/Procedures:  None ordered.   Follow-Up: At [facility], you and your health needs are our priority.  As part of our continuing mission to provide you with exceptional heart care, we have created designated Provider Care Teams.  These Care Teams include your primary Cardiologist (physician) and Advanced Practice Providers (APPs -  Physician Assistants and Nurse Practitioners) who all work together to provide you with the care you need, when you need it.  We recommend signing up for the patient portal called "MyChart".  Sign up information is provided on this After Visit Summary.  MyChart is used to connect with patients for Virtual Visits (Telemedicine).  Patients are able to view lab/test results, encounter notes, upcoming appointments, etc.  Non-urgent messages can be sent to your provider as well.   To learn more about what you can do with MyChart, go to [email or web address].    Your next appointment:   6 month(s)  Provider:   Sherren Mocha, MD

## 2023-03-06 ENCOUNTER — Encounter: Payer: Self-pay | Admitting: Nurse Practitioner

## 2023-03-06 ENCOUNTER — Ambulatory Visit: Payer: Commercial Managed Care - PPO | Admitting: Physical Therapy

## 2023-03-07 ENCOUNTER — Encounter: Payer: Self-pay | Admitting: Radiology

## 2023-03-10 ENCOUNTER — Other Ambulatory Visit: Payer: Self-pay | Admitting: Cardiovascular Disease

## 2023-03-10 ENCOUNTER — Other Ambulatory Visit: Payer: Self-pay

## 2023-03-10 ENCOUNTER — Other Ambulatory Visit: Payer: Self-pay | Admitting: Physician Assistant

## 2023-03-10 MED FILL — Clopidogrel Bisulfate Tab 75 MG (Base Equiv): ORAL | 90 days supply | Qty: 90 | Fill #1 | Status: AC

## 2023-03-11 ENCOUNTER — Ambulatory Visit
Admission: RE | Admit: 2023-03-11 | Discharge: 2023-03-11 | Disposition: A | Discharge status: Home / Self Care | Payer: Commercial Managed Care - PPO | Source: Ambulatory Visit | Attending: Physician Assistant | Admitting: Physician Assistant

## 2023-03-11 ENCOUNTER — Other Ambulatory Visit: Payer: Self-pay

## 2023-03-11 ENCOUNTER — Other Ambulatory Visit: Payer: Self-pay | Admitting: *Deleted

## 2023-03-11 DIAGNOSIS — M25551 Pain in right hip: Secondary | ICD-10-CM

## 2023-03-11 DIAGNOSIS — M1611 Unilateral primary osteoarthritis, right hip: Secondary | ICD-10-CM | POA: Diagnosis not present

## 2023-03-11 MED ORDER — LEFLUNOMIDE 20 MG PO TABS
20.0000 mg | ORAL_TABLET | Freq: Every day | ORAL | 0 refills | Status: DC
  Filled 2023-03-11 – 2023-03-28 (×2): qty 90, 90d supply, fill #0

## 2023-03-11 MED ORDER — TORSEMIDE 20 MG PO TABS
20.0000 mg | ORAL_TABLET | Freq: Every day | ORAL | 3 refills | Status: DC
  Filled 2023-03-11: qty 90, 90d supply, fill #0
  Filled 2023-06-10: qty 90, 90d supply, fill #1
  Filled 2023-09-01: qty 90, 90d supply, fill #2
  Filled 2023-12-01: qty 90, 90d supply, fill #3

## 2023-03-11 MED ORDER — METOPROLOL TARTRATE 50 MG PO TABS
25.0000 mg | ORAL_TABLET | Freq: Two times a day (BID) | ORAL | 3 refills | Status: DC
  Filled 2023-03-11: qty 90, 90d supply, fill #0
  Filled 2023-06-10: qty 90, 90d supply, fill #1
  Filled 2023-09-01: qty 90, 90d supply, fill #2
  Filled 2023-12-01: qty 90, 90d supply, fill #3

## 2023-03-11 MED ORDER — ISOSORBIDE MONONITRATE ER 60 MG PO TB24
60.0000 mg | ORAL_TABLET | Freq: Every day | ORAL | 3 refills | Status: DC
  Filled 2023-03-11: qty 90, 90d supply, fill #0
  Filled 2023-06-10: qty 90, 90d supply, fill #1
  Filled 2023-09-27: qty 90, 90d supply, fill #2
  Filled 2023-12-27: qty 90, 90d supply, fill #3

## 2023-03-11 NOTE — Telephone Encounter (Signed)
Next Visit: 03/14/2023  Last Visit: 12/10/2022  Last Fill: 12/28/2022  DX:  Rheumatoid arthritis of multiple sites with negative rheumatoid factor    Current Dose per office note 12/10/2022: Arava 20 mg 1 tablet by mouth daily   Labs: 12/05/2022 Hgb 11.7, Hct 34.1, Glucose 172  Patient to update labs at upcoming appointment on 03/14/2023  Okay to refill Arava?

## 2023-03-13 ENCOUNTER — Encounter: Payer: Self-pay | Admitting: Physical Therapy

## 2023-03-14 ENCOUNTER — Ambulatory Visit: Payer: Commercial Managed Care - PPO | Attending: Physician Assistant | Admitting: Physician Assistant

## 2023-03-14 ENCOUNTER — Other Ambulatory Visit: Payer: Self-pay

## 2023-03-14 ENCOUNTER — Encounter: Payer: Self-pay | Admitting: Physician Assistant

## 2023-03-14 ENCOUNTER — Ambulatory Visit (INDEPENDENT_AMBULATORY_CARE_PROVIDER_SITE_OTHER): Payer: Commercial Managed Care - PPO

## 2023-03-14 VITALS — BP 131/72 | HR 78 | Resp 15 | Ht 63.0 in | Wt 178.0 lb

## 2023-03-14 DIAGNOSIS — G5701 Lesion of sciatic nerve, right lower limb: Secondary | ICD-10-CM | POA: Diagnosis not present

## 2023-03-14 DIAGNOSIS — M5134 Other intervertebral disc degeneration, thoracic region: Secondary | ICD-10-CM | POA: Diagnosis not present

## 2023-03-14 DIAGNOSIS — M25561 Pain in right knee: Secondary | ICD-10-CM | POA: Diagnosis not present

## 2023-03-14 DIAGNOSIS — M79641 Pain in right hand: Secondary | ICD-10-CM

## 2023-03-14 DIAGNOSIS — M5136 Other intervertebral disc degeneration, lumbar region: Secondary | ICD-10-CM

## 2023-03-14 DIAGNOSIS — Z79899 Other long term (current) drug therapy: Secondary | ICD-10-CM | POA: Diagnosis not present

## 2023-03-14 DIAGNOSIS — M25551 Pain in right hip: Secondary | ICD-10-CM | POA: Diagnosis not present

## 2023-03-14 DIAGNOSIS — M8589 Other specified disorders of bone density and structure, multiple sites: Secondary | ICD-10-CM

## 2023-03-14 DIAGNOSIS — Z8639 Personal history of other endocrine, nutritional and metabolic disease: Secondary | ICD-10-CM

## 2023-03-14 DIAGNOSIS — Z8669 Personal history of other diseases of the nervous system and sense organs: Secondary | ICD-10-CM

## 2023-03-14 DIAGNOSIS — M7061 Trochanteric bursitis, right hip: Secondary | ICD-10-CM

## 2023-03-14 DIAGNOSIS — Z8719 Personal history of other diseases of the digestive system: Secondary | ICD-10-CM

## 2023-03-14 DIAGNOSIS — Z8679 Personal history of other diseases of the circulatory system: Secondary | ICD-10-CM

## 2023-03-14 DIAGNOSIS — L409 Psoriasis, unspecified: Secondary | ICD-10-CM

## 2023-03-14 DIAGNOSIS — M24522 Contracture, left elbow: Secondary | ICD-10-CM

## 2023-03-14 DIAGNOSIS — M0609 Rheumatoid arthritis without rheumatoid factor, multiple sites: Secondary | ICD-10-CM

## 2023-03-14 DIAGNOSIS — G4709 Other insomnia: Secondary | ICD-10-CM

## 2023-03-14 DIAGNOSIS — M79642 Pain in left hand: Secondary | ICD-10-CM | POA: Diagnosis not present

## 2023-03-14 DIAGNOSIS — R5383 Other fatigue: Secondary | ICD-10-CM | POA: Diagnosis not present

## 2023-03-14 DIAGNOSIS — M24521 Contracture, right elbow: Secondary | ICD-10-CM | POA: Diagnosis not present

## 2023-03-14 DIAGNOSIS — G8929 Other chronic pain: Secondary | ICD-10-CM

## 2023-03-14 MED ORDER — CYCLOBENZAPRINE HCL 10 MG PO TABS
10.0000 mg | ORAL_TABLET | Freq: Every evening | ORAL | 0 refills | Status: DC | PRN
  Filled 2023-03-14: qty 30, 30d supply, fill #0

## 2023-03-14 NOTE — Patient Instructions (Signed)
Standing Labs We placed an order today for your standing lab work.   Please have your standing labs drawn in June and every 3 months   Please have your labs drawn 2 weeks prior to your appointment so that the provider can discuss your lab results at your appointment, if possible.  Please note that you may see your imaging and lab results in MyChart before we have reviewed them. We will contact you once all results are reviewed. Please allow our office up to 72 hours to thoroughly review all of the results before contacting the office for clarification of your results.  WALK-IN LAB HOURS  Monday through Thursday from 8:00 am -12:30 pm and 1:00 pm-5:00 pm and Friday from 8:00 am-12:00 pm.  Patients with office visits requiring labs will be seen before walk-in labs.  You may encounter longer than normal wait times. Please allow additional time. Wait times may be shorter on  Monday and Thursday afternoons.  We do not book appointments for walk-in labs. We appreciate your patience and understanding with our staff.   Labs are drawn by [facility]. Please bring your co-pay at the time of your lab draw.  You may receive a bill from [facility] for your lab work.  Please note if you are on Hydroxychloroquine and and an order has been placed for a Hydroxychloroquine level,  you will need to have it drawn 4 hours or more after your last dose.  If you wish to have your labs drawn at another location, please call the office 24 hours in advance so we can fax the orders.  The office is located at [address], [address], [place], [place] [address]   If you have any questions regarding directions or hours of operation,  please call [phone].   As a reminder, please drink plenty of water prior to coming for your lab work. Thanks!  

## 2023-03-15 LAB — COMPLETE METABOLIC PANEL WITH GFR
AG Ratio: 2.3 (calc) (ref 1.0–2.5)
ALT: 26 U/L (ref 6–29)
AST: 29 U/L (ref 10–35)
Albumin: 4.5 g/dL (ref 3.6–5.1)
Alkaline phosphatase (APISO): 66 U/L (ref 37–153)
BUN: 14 mg/dL (ref 7–25)
CO2: 28 mmol/L (ref 20–32)
Calcium: 9.4 mg/dL (ref 8.6–10.4)
Chloride: 100 mmol/L (ref 98–110)
Creat: 0.84 mg/dL (ref 0.50–1.03)
Globulin: 2 g/dL (calc) (ref 1.9–3.7)
Glucose, Bld: 121 mg/dL — ABNORMAL HIGH (ref 65–99)
Potassium: 4 mmol/L (ref 3.5–5.3)
Sodium: 138 mmol/L (ref 135–146)
Total Bilirubin: 0.3 mg/dL (ref 0.2–1.2)
Total Protein: 6.5 g/dL (ref 6.1–8.1)
eGFR: 82 mL/min/{1.73_m2} (ref >=60)

## 2023-03-15 LAB — CBC WITH DIFFERENTIAL/PLATELET
Absolute Monocytes: 367 cells/uL (ref 200–950)
Basophils Absolute: 22 cells/uL (ref 0–200)
Basophils Relative: 0.6 %
Eosinophils Absolute: 90 cells/uL (ref 15–500)
Eosinophils Relative: 2.5 %
HCT: 35.4 % (ref 35.0–45.0)
Hemoglobin: 12.1 g/dL (ref 11.7–15.5)
Lymphs Abs: 1526 cells/uL (ref 850–3900)
MCH: 29.5 pg (ref 27.0–33.0)
MCHC: 34.2 g/dL (ref 32.0–36.0)
MCV: 86.3 fL (ref 80.0–100.0)
MPV: 9.6 fL (ref 7.5–12.5)
Monocytes Relative: 10.2 %
Neutro Abs: 1595 cells/uL (ref 1500–7800)
Neutrophils Relative %: 44.3 %
Platelets: 219 10*3/uL (ref 140–400)
RBC: 4.1 10*6/uL (ref 3.80–5.10)
RDW: 12.9 % (ref 11.0–15.0)
Total Lymphocyte: 42.4 %
WBC: 3.6 10*3/uL — ABNORMAL LOW (ref 3.8–10.8)

## 2023-03-15 LAB — TSH: TSH: 1.04 mIU/L

## 2023-03-15 NOTE — Progress Notes (Signed)
TSH WNL. Glucose is 121. Rest of CMP WNL.  WBC count is borderline low-3.6. rest of CBC Wnl.  Recommend rechecking CBC with diff in 1 month.

## 2023-03-18 ENCOUNTER — Other Ambulatory Visit: Payer: Self-pay | Admitting: *Deleted

## 2023-03-18 DIAGNOSIS — M0609 Rheumatoid arthritis without rheumatoid factor, multiple sites: Secondary | ICD-10-CM

## 2023-03-18 DIAGNOSIS — Z79899 Other long term (current) drug therapy: Secondary | ICD-10-CM

## 2023-03-18 DIAGNOSIS — L409 Psoriasis, unspecified: Secondary | ICD-10-CM

## 2023-03-19 ENCOUNTER — Other Ambulatory Visit: Payer: Self-pay

## 2023-03-20 ENCOUNTER — Ambulatory Visit: Payer: Commercial Managed Care - PPO | Admitting: Physical Therapy

## 2023-03-21 ENCOUNTER — Ambulatory Visit: Payer: Commercial Managed Care - PPO | Admitting: Physician Assistant

## 2023-03-21 ENCOUNTER — Other Ambulatory Visit: Payer: Self-pay

## 2023-03-21 ENCOUNTER — Other Ambulatory Visit (HOSPITAL_COMMUNITY): Payer: Self-pay

## 2023-03-21 ENCOUNTER — Encounter: Payer: Self-pay | Admitting: Physician Assistant

## 2023-03-21 DIAGNOSIS — M7061 Trochanteric bursitis, right hip: Secondary | ICD-10-CM | POA: Diagnosis not present

## 2023-03-21 DIAGNOSIS — M76899 Other specified enthesopathies of unspecified lower limb, excluding foot: Secondary | ICD-10-CM

## 2023-03-21 DIAGNOSIS — M25551 Pain in right hip: Secondary | ICD-10-CM | POA: Diagnosis not present

## 2023-03-21 MED ORDER — METHYLPREDNISOLONE ACETATE 40 MG/ML IJ SUSP
40.0000 mg | INTRAMUSCULAR | Status: AC | PRN
  Administered 2023-03-21: 40 mg via INTRA_ARTICULAR

## 2023-03-21 MED ORDER — LIDOCAINE HCL 1 % IJ SOLN
3.0000 mL | INTRAMUSCULAR | Status: AC | PRN
  Administered 2023-03-21: 3 mL

## 2023-03-21 NOTE — Progress Notes (Signed)
HPI: [name] returns today to go over the MRI of the right hip.  She still having pain in both hips right greater than left.  Also has buttocks pain bilaterally.  She has had prior trochanteric injection on the right which was given by Dr. Estanislado Pandy [place] last year.  This gave her no real relief.  She has tried formal therapy for hamstring stretching IT band stretching really does not feel that this has been helpful.  She does state that while doing PT that it helped a little and that dry needling helped a little.  However pain returned soon after finishing therapy.  She is having no real radicular pain down either leg.  She takes ibuprofen daily for severe pain.  Takes Flexeril at night.  She is on Plavix. MRIs reviewed with the patient images shown.  Shows mild right hip osteoarthritis.  Also hamstring origin tendinosis mild to moderate left greater than right no tears.  Bursa was negative right hip.  No joint effusion.   Review of systems: See HPI.  Physical exam bilateral hips good range of motion.  She has tried.  Tenderness bilaterally.  Tenderness bilaterally at the hamstring origin.   Plan: Will have her see Dr. [name] for evaluation for possible injection or treatment of the hamstring tendinosis bilaterally.  Today we will try a right hip trochanteric injection.  Hemoglobin A1c is 6.5.     Procedure Note  Patient: [name]             Date of Birth: 1966/11/27           MRN: [id number]             Visit Date: 03/21/2023  Procedures: Visit Diagnoses:  1. Pain in right hip   2. Trochanteric bursitis, right hip   3. Hamstring tendinitis     Large Joint Inj: R greater trochanter on 03/21/2023 4:47 PM Indications: pain Details: 22 G 1.5 in needle, lateral approach  Arthrogram: No  Medications: 3 mL lidocaine 1 %; 40 mg methylPREDNISolone acetate 40 MG/ML Outcome: tolerated well, no immediate complications Procedure, treatment alternatives, risks and benefits explained,  specific risks discussed. Consent was given by the patient. Immediately prior to procedure a time out was called to verify the correct patient, procedure, equipment, support staff and site/side marked as required. Patient was prepped and draped in the usual sterile fashion.

## 2023-03-22 ENCOUNTER — Encounter
Payer: Commercial Managed Care - PPO | Attending: Physical Medicine and Rehabilitation | Admitting: Physical Medicine and Rehabilitation

## 2023-03-22 ENCOUNTER — Other Ambulatory Visit: Payer: Self-pay

## 2023-03-22 ENCOUNTER — Encounter: Payer: Self-pay | Admitting: Physical Medicine and Rehabilitation

## 2023-03-22 VITALS — BP 117/73 | HR 81 | Ht 63.0 in | Wt 175.2 lb

## 2023-03-22 DIAGNOSIS — M7061 Trochanteric bursitis, right hip: Secondary | ICD-10-CM

## 2023-03-22 DIAGNOSIS — M7918 Myalgia, other site: Secondary | ICD-10-CM | POA: Insufficient documentation

## 2023-03-22 DIAGNOSIS — G43011 Migraine without aura, intractable, with status migrainosus: Secondary | ICD-10-CM | POA: Diagnosis not present

## 2023-03-22 DIAGNOSIS — M0609 Rheumatoid arthritis without rheumatoid factor, multiple sites: Secondary | ICD-10-CM | POA: Diagnosis not present

## 2023-03-22 DIAGNOSIS — G894 Chronic pain syndrome: Secondary | ICD-10-CM | POA: Diagnosis not present

## 2023-03-22 DIAGNOSIS — M25551 Pain in right hip: Secondary | ICD-10-CM

## 2023-03-22 MED ORDER — ONDANSETRON HCL 4 MG PO TABS
4.0000 mg | ORAL_TABLET | Freq: Four times a day (QID) | ORAL | 3 refills | Status: DC | PRN
  Filled 2023-03-22: qty 60, 15d supply, fill #0
  Filled 2023-03-22: qty 30, 8d supply, fill #0

## 2023-03-22 MED ORDER — DULOXETINE HCL 20 MG PO CPEP
ORAL_CAPSULE | ORAL | 5 refills | Status: DC
  Filled 2023-03-22: qty 90, 30d supply, fill #0

## 2023-03-22 NOTE — Patient Instructions (Signed)
Pt is a 57 yr old female with hx of RA, (-) rh factor, thoracic DDD, DM with retinopathy- Ac 6.5, migraines without aura, and MI/CABG in past-  Also has Graves disease; and just dx'd with tendinitis in B/L hamstring tendinosis- s/p recent R injection (due to DM only did one)- no CKD Also has myofascial pain syndrome   Here for evaluation of chronic pain.    Trigger point injections- will try to get on wait list-   2. Low dose naltrexone-wait on this unless Duloxetine doesn't work.    3.  Will try Cymbalta 20 mg QHS x 1 week, then 40 mg QHS x 1 week then 60 mg QHS- for musculoskeletal pain, myofascial pain.    4. Has Zofran but not enough- will send in Zofran 4 mg q6 hours prn- # 90- 5 refills.    5. Lidocaine patches- try over the counter- 12 hrs on;12 hrs- on shoulders/upper traps and around neck if need be- do at NIGHT!   6. Theracane or The Muscle Hook- youtube has great videos on how ot use- Hold pressure 2-4 minutes on MUSCLES that are tight - start out with mild pressure- increase  pressure as muscle relaxes and you feel relax some. It will be more effective after trigger point injections.    7. Stop PT for hips- was making pain worse   8. F/U  on wait list ASAP for trigger pointinjections and 3 months for f/u.   9. I'd like to hear form [facility] in ~ 1 month- to let me know how things going.   10 Trigger points can help migraines- scalenes and levators affected most by migraines- best spots for migraine treatment

## 2023-03-22 NOTE — Progress Notes (Signed)
Subjective:    Patient ID: [name], female    DOB: 07-27-1966, 57 y.o.   MRN: [id number]  HPI  Pt is a 57 yr old female with hx of RA, (-) rh factor, thoracic DDD, DM with retinopathy- Ac 6.5, migraines without aura, and MI/CABG in past-  Also has Graves disease; and just dx'd with tendinitis in B/L hamstring tendinosis- s/p recent R injection (due to DM only did one)- no CKD  Here for evaluation of chronic pain.    Hasn't really been on pain meds No nerve pain- no neuropathy-   Injection for R hip didn't help yet.   Tried: Ibuprofen- takes only to take 800 mg 1x/day- on plavix already-  But feels like needs ot do something- helps minimally Takes flexeril at night- calms her down so can sleep- doesn't take for muscle spasms.  Been a long time- pain meds make her nauseated Tried voltaren gel- was using on R hip- helped a little-  Doesn't think has tried Cymbalta for pain.  Have taken Tramadol- makes nauseated- just a couple days. Not real helpful for pain.  Tylenol #3- codeine makes her nauseated.  Has tried Norco if had surgery- doesn't do well- takes phenergan and sleeping.  Oxy makes her feel like will pull skin off.    Wants to try anything, that MIGHT work- tired of being in pain- and tired.   Gets nauseated easily from pain meds.    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  Pain Inventory Average Pain 5 Pain Right Now 8 My pain is constant, sharp, and aching  In the last 24 hours, has pain interfered with the following? General activity 7 Relation with others 4 Enjoyment of life 8 What TIME of day is your pain at its worst? morning , daytime, evening, and night Sleep (in general) Poor  Pain is worse with: walking, bending, sitting, and some activites Pain improves with:  . Relief from Meds: 3  walk without assistance how many minutes can you walk? unlimited ability to climb steps?  yes do you drive?  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 I need assistance with the following:  household duties  weakness trouble walking spasms  New pt  New pt    Family History  Problem Relation Age of Onset   Depression Mother    Anxiety disorder Mother    Colon polyps Father    Diabetes Father        2   Hypertension Father    Parkinson's disease Father    Stroke Paternal Grandmother    Heart disease Other    Hypertension Other    Hyperlipidemia Other    Depression Other    Migraines Other    Heart attack Neg Hx    Colon cancer Neg Hx    Stomach cancer Neg Hx    Esophageal cancer Neg Hx    Social History   Socioeconomic History   Marital status: Divorced    Spouse name: Not on file   Number of children: Not on file   Years of education: Not on file   Highest education level: Not on file  Occupational History   Not on file  Tobacco Use   Smoking status: Never    Passive exposure: Never   Smokeless tobacco: Never  Vaping Use   Vaping Use: Never used  Substance and Sexual Activity   Alcohol use: Yes    Comment: rarely 1  every 6 months   Drug use: No   Sexual activity: Not Currently    Partners: Male    Birth control/protection: None  Other Topics Concern   Not on file  Social History Narrative   Divorced. Has 2 kids(fraternal twins) daughters age 34. Works at  [employer], Never smoked, denies ETOH, no drugs. Drinks diet coke. No exercise.       DPR daughters [name] and Kenzlei Kemnitz twins          Social Determinants of Health   Financial Resource Strain: Not on file  Food Insecurity: Not on file  Transportation Needs: Not on file  Physical Activity: Not on file  Stress: Not on file  Social Connections: Not on file   Past Surgical History:  Procedure Laterality Date   ABDOMINAL HYSTERECTOMY     endometriomas b/l; total ? cervix removed; no h/o abnormal pap   Caesarean section     CARDIAC CATHETERIZATION N/A 05/08/2016   Procedure: Left Heart Cath and Cors/Grafts Angiography;   Surgeon: Burnell Blanks, MD;  Location: [facility];  Service: Cardiovascular;  Laterality: N/A;   CARPAL TUNNEL RELEASE     b/l    CARPAL TUNNEL RELEASE Left 06/06/2021   Procedure: CARPAL TUNNEL RELEASE;  Surgeon: Daryll Brod, MD;  Location: [facility];  Service: Orthopedics;  Laterality: Left;  AXILLARY BLOCK   CATARACT EXTRACTION, BILATERAL     CHOLECYSTECTOMY     COLONOSCOPY WITH PROPOFOL N/A 03/25/2020   Procedure: COLONOSCOPY WITH PROPOFOL;  Surgeon: Jonathon Bellows, MD;  Location[facility] ENDOSCOPY;  Service: Gastroenterology;  Laterality: N/A;   CORONARY ARTERY BYPASS GRAFT     ESOPHAGOGASTRODUODENOSCOPY (EGD) WITH PROPOFOL N/A 03/25/2020   Procedure: ESOPHAGOGASTRODUODENOSCOPY (EGD) WITH PROPOFOL;  Surgeon: Jonathon Bellows, MD;  Location: [facility] ENDOSCOPY;  Service: Gastroenterology;  Laterality: N/A;   EYE SURGERY     laser x 2 for retinopathy    LEFT HEART CATH AND CORS/GRAFTS ANGIOGRAPHY N/A 02/22/2020   Procedure: LEFT HEART CATH AND CORS/GRAFTS ANGIOGRAPHY;  Surgeon: Burnell Blanks, MD;  Location: [facility];  Service: Cardiovascular;  Laterality: N/A;   TRIGGER FINGER RELEASE     b/l fingers all    ULNAR NERVE TRANSPOSITION Left 06/06/2021   Procedure: ULNAR NERVE DECOMPRESSION;  Surgeon: Daryll Brod, MD;  Location: [facility];  Service: Orthopedics;  Laterality: Left;  AXILLARY BLOCK   VITRECTOMY     b/l    Past Medical History:  Diagnosis Date   ACUT MI ANTEROLAT WALL SUBSQT EPIS CARE    Acute maxillary sinusitis    ALLERGIC RHINITIS, SEASONAL    Anemia    ARTHRITIS, RHEUMATOID    shoulders and hands Enbrel>leg swelling Dr. Estanislado Pandy   Atrial fibrillation [facility])    a. after CABG.   Back pain    Bruxism (teeth grinding)    CAD, ARTERY BYPASS GRAFT    a. DES to RCA in 2010 then LAD occlusion s/p CABG 3 06/07/2009 with LIMA to LAD, reverse SVG to D1, reverse SVG to distal RCA. b. Cath 05/08/2016 slightly hypodense region in the  intermediate branch, however she had excellent flow, FFR was normal. Vein graft to PDA and the posterolateral branch is patent, patent LIMA to LAD, occluded SVG to diagonal.   CARPAL TUNNEL SYNDROME, BILATERAL    CHOLELITHIASIS    Contrast media allergy    DERMATITIS, ALLERGIC    DIABETES MELLITUS, TYPE I    on insulin pump dx'ed age  80 y.o    DIABETIC  RETINOPATHY    Hiatal hernia    HYPERLIPIDEMIA-MIXED    HYPERTHYROIDISM    Dr. Dollene Cleveland   IDA (iron deficiency anemia)    Insulin pump in place    Lymphadenopathy of head and neck    MIGRAINE W/O AURA W/INTRACT W/STATUS MIGRAINOSUS 02/19/2008   PONV (postoperative nausea and vomiting)    Psoriasis    SINUS TACHYCARDIA 11/08/2010   Sleep apnea    not using machine yet    SVT (supraventricular tachycardia)    after s/p CABG   Tendonitis    TRIGGER FINGER    all fingers b/l hands    URI    BP 117/73   Pulse 81   Ht 5\' 3"  (1.6 m)   Wt 175 lb 3.2 oz (79.5 kg)   SpO2 97%   BMI 31.04 kg/m   Opioid Risk Score:   Fall Risk Score:  `1  Depression screen [facility] 2/9     03/22/2023    3:17 PM 05/15/2022    3:01 PM 11/14/2021    3:42 PM 09/14/2020   11:56 AM 03/10/2020    4:03 PM 02/16/2020   12:48 PM 03/09/2019    1:21 PM  Depression screen PHQ 2/9  Decreased Interest 0 0 0 0 0 0 2  Down, Depressed, Hopeless 0 0 0 0 0 0 0  PHQ - 2 Score 0 0 0 0 0 0 2  Altered sleeping 1      3  Tired, decreased energy 3      3  Change in appetite 0      0  Feeling bad or failure about yourself  0      0  Trouble concentrating 0      0  Moving slowly or fidgety/restless 0      0  Suicidal thoughts 0      0  PHQ-9 Score 4      8  Difficult doing work/chores Not difficult at all      Somewhat difficult      Review of Systems  Musculoskeletal:  Positive for back pain.       Bilateral shoulder pain Bilateral hand pain Bilateral knee pain Bilateral elbow pain Buttocks pain Bilateral shoulder blade pain   All other systems reviewed and  are negative.     Objective:   Physical Exam  Awake, alert, appropriate, sitting on table, Graves diease eyes, NAD Holding self stiffly.  TTP over trigger points in upper traps, levators, scalenes, and rhomboids; B/L also pecs B/L   Cannot fully extend elbows Also TTP ovr trigger points in forearms - L>R  TTP over R deltoid- more than actual shoulder joint.        Assessment & Plan:   Pt is a 57 yr old female with hx of RA, (-) rh factor, thoracic DDD, DM with retinopathy- Ac 6.5, migraines without aura, and MI/CABG in past-  Also has Graves disease; and just dx'd with tendinitis in B/L hamstring tendinosis- s/p recent R injection (due to DM only did one)- no CKD Also has myofascial pain syndrome   Here for evaluation of chronic pain.    Trigger point injections- will try to get on wait list-   2. Low dose naltrexone-wait on this unless Duloxetine doesn't work.    3.  Will try Cymbalta 20 mg QHS x 1 week, then 40 mg QHS x 1 week then 60 mg QHS- for musculoskeletal pain, myofascial  pain.    4. Has Zofran but not enough- will send in Zofran 4 mg q6 hours prn- # 90- 5 refills.    5. Lidocaine patches- try over the counter- 12 hrs on;12 hrs- on shoulders/upper traps and around neck if need be- do at NIGHT!   6. Theracane or The Muscle Hook- youtube has great videos on how ot use- Hold pressure 2-4 minutes on MUSCLES that are tight - start out with mild pressure- increase  pressure as muscle relaxes and you feel relax some. It will be more effective after trigger point injections.    7. Stop PT for hips- was making pain worse   8. F/U  on wait list ASAP for trigger point injections and 3 months for f/u.   9. I'd like to hear form [facility] in ~ 1 month- to let me know how things going.   10 Trigger points can help migraines- scalenes and levators affected most by migraines- best spots for migraine treatment   I spent a total of  36  minutes on total care today- >50%  coordination of care- due to  discussion and education on trigger points and treatment and side effects of meds

## 2023-03-25 ENCOUNTER — Ambulatory Visit: Payer: Commercial Managed Care - PPO

## 2023-03-25 ENCOUNTER — Other Ambulatory Visit: Payer: Self-pay

## 2023-03-25 ENCOUNTER — Other Ambulatory Visit (HOSPITAL_COMMUNITY): Payer: Self-pay

## 2023-03-26 ENCOUNTER — Ambulatory Visit: Payer: Commercial Managed Care - PPO | Admitting: Sports Medicine

## 2023-03-26 ENCOUNTER — Encounter: Payer: Self-pay | Admitting: Sports Medicine

## 2023-03-26 DIAGNOSIS — M25551 Pain in right hip: Secondary | ICD-10-CM

## 2023-03-26 DIAGNOSIS — R29898 Other symptoms and signs involving the musculoskeletal system: Secondary | ICD-10-CM

## 2023-03-26 DIAGNOSIS — M76899 Other specified enthesopathies of unspecified lower limb, excluding foot: Secondary | ICD-10-CM

## 2023-03-26 DIAGNOSIS — M25552 Pain in left hip: Secondary | ICD-10-CM | POA: Diagnosis not present

## 2023-03-26 NOTE — Progress Notes (Signed)
Bilateral hip pain Right worse than left Had injection last week from [facility]  Here to talk about next options; hip injection vs. Shockwave   Patient was instructed in 10 minutes of therapeutic exercises for bilateral hip pain to improve strength, ROM and function according to my instructions and plan of care by a Certified Athletic Trainer during the office visit. A customized handout was provided and demonstration of proper technique shown and discussed. Patient did perform exercises and demonstrate understanding through teachback.  All questions discussed and answered.

## 2023-03-26 NOTE — Progress Notes (Signed)
[name] - 57 y.o. female MRN [id number]  Date of birth: 07-06-1966  Office Visit Note: Visit Date: 03/26/2023 PCP: Michela Pitcher, NP Referred by: Pete Pelt, PA-C  Subjective: Chief Complaint  Patient presents with   Right Hip - Pain   HPI: [name] is a pleasant 57 y.o. female who presents today for right posterior buttock pain. Bilateral hip/buttock pain.  She has bilateral posterior hip pain near the ischial bursa.  Right has been more bothersome than the left.  Does have a history of formal physical therapy as well as dry needling that helped temporarily, but her pain has returned.  Ibuprofen as needed for severe pain only, she is on Plavix and has a history of CAD.  Last seen by my partner, Benita Stabile, did proceed with trochanteric bursa injection last week, does not feel like this helped her pain much.  Pain continues over the posterior buttock right greater than left.  Pertinent ROS were reviewed with the patient and found to be negative unless otherwise specified above in HPI.   Assessment & Plan: Visit Diagnoses:  1. Hamstring tendinitis   2. Weakness of right hip   3. Bilateral hip pain    Plan: Discussed with Jemeka the etiology of her right greater than left hip pain -she does have MRI confirmed insertional tendinosis of the hamstring musculature bilaterally.  She does have weakness of the hip gluteal muscles, right greater than left.  I do think that this is contributing to her pain as well.  We discussed all treatment options, she is limited on NSAIDs given her CAD.  Through shared decision-making, elected to proceed with trial of extracorporeal shockwave therapy to the right posterior lateral hip and ischial bursa.  Patient tolerated well.  She will proceed next week for an additional treatment, after 2 treatments we will see if this is beneficial.  May consider this on the left side as well.  Other considerations of treatment may be ischial bursa or  proximal hamstring tendon injection under ultrasound, we will hold on this for now.  I do want her to start strengthening her hip abductors and gluteal muscles, she preferred home therapy over physical therapy.  My athletic trainer, Lilia Pro did review a customized handout for hip strengthening with her in the room today.  She is to start this tomorrow, goal for once daily. F/u in 1 week.  Follow-up: Return in about 1 week (around 04/02/2023) for next 1-2 weeks for posterior R-hip.   Meds & Orders: No orders of the defined types were placed in this encounter.  No orders of the defined types were placed in this encounter.    Procedures: Procedure: ECSWT Indications:  Ischial Bursitis/Proximal hamstring tendinosis   Procedure Details Consent: Risks of procedure as well as the alternatives and risks of each were explained to the patient.  Verbal consent for procedure obtained. Time Out: Verified patient identification, verified procedure, site was marked, verified correct patient position. The area was cleaned with alcohol swab.     The right ischial tuberosity, hamstring origin, and gluteus med/min was targeted for Extracorporeal shockwave therapy.    Preset: Trochanteric/Ischial Bursitis Power Level: 120 mJ Frequency: 10 Hz Impulse/cycles: 2500 Head size: Regular   Patient tolerated procedure well without immediate complications.       Clinical History: No specialty comments available.  She reports that she has never smoked. She has never been exposed to tobacco smoke. She has never used smokeless tobacco.  Recent Labs    05/15/22 1557  HGBA1C 7.0*    Objective:    Physical Exam  Gen: Well-appearing, in no acute distress; non-toxic CV: Regular Rate. Well-perfused. Warm.  Resp: Breathing unlabored on room air; no wheezing. Psych: Fluid speech in conversation; appropriate affect; normal thought process Neuro: Sensation intact throughout. No gross coordination deficits.   Ortho  Exam - Bilateral hips: Positive TTP over bilateral trochanters, right greater than left.  There is no overlying swelling or redness.  Positive TTP at the ischial bursa on the common hamstring tendon origin.  There is weakness 4/5 with resisted hip abduction on the right compared to the left.  She does have some guarding with internal rotation and FADIR testing of the hip that is mildly tender.  Positive FABER test.   Imaging:  CLINICAL DATA:  Right hip pain for 6 months.  No known injury.   EXAM: MR OF THE RIGHT HIP WITHOUT CONTRAST   TECHNIQUE: Multiplanar, multisequence MR imaging was performed. No intravenous contrast was administered.   COMPARISON:  None Available.   FINDINGS: Bones: No fracture, stress change or worrisome lesion is identified. No subchondral cyst formation or edema about the hips. No avascular necrosis of the femoral heads.   Articular cartilage and labrum   Articular cartilage:  Minimally degenerated.   Labrum: The superior labrum appears mildly frayed but no tear is identified.   Joint or bursal effusion   Joint effusion:  None.   Bursae: Negative.   Muscles and tendons   Muscles and tendons: Intact. Mildly to moderately increased T2 signal at the hamstring origins bilaterally is compatible with tendinosis and worse on the left.   Other findings   Miscellaneous:   The patient is status post hysterectomy.   IMPRESSION: 1. Mild appearing right hip osteoarthritis. 2. Mild to moderate appearing bilateral hamstring origin tendinosis without tear appears worse on the left.     Electronically Signed   By: Inge Rise M.D.   On: 03/13/2023 11:55     Past Medical/Family/Surgical/Social History: Medications & Allergies reviewed per EMR, new medications updated. Patient Active Problem List   Diagnosis Date Noted   Myofascial pain dysfunction syndrome 03/22/2023   Left foot pain 12/05/2022   Urinary urgency 10/23/2022   Pyelonephritis  10/23/2022   Premature atrial contractions 05/15/2022   PVC's (premature ventricular contractions) 05/15/2022   Low hemoglobin 01/16/2022   Right ear pain 01/09/2022   Sore throat 01/09/2022   OSA on CPAP 08/02/2021   Abnormal MRI, lumbar spine 03/20/2021   Abnormal MRI, thoracic spine 03/20/2021   Chronic mid back pain 03/17/2021   Chronic midline low back pain with left-sided sciatica 03/17/2021   Type 1 diabetes mellitus with diabetic polyneuropathy ([place]) 02/17/2021   Severe obstructive sleep apnea-hypopnea syndrome 02/17/2021   Chronic diastolic CHF (congestive heart failure) ([place]) 12/19/2020   Chronic back pain 12/19/2020   Chronic abdominal pain [id number]   Acute diastolic heart failure ([place]) 12/09/2020   Diabetic retinopathy associated with type 1 diabetes mellitus ([place]) 09/27/2020   Diabetic retinopathy ([place]) 09/27/2020   Graves' disease 09/27/2020   Chronic coronary artery disease 09/27/2020   Hypertension associated with diabetes ([place]) 09/14/2020   Obesity (BMI 30-39.9) 09/14/2020   Preventative health care 09/14/2020   Aortic atherosclerosis ([place]) 09/14/2020   Snoring 05/12/2020   Lung nodule 03/10/2020   Colitis 03/10/2020   Type 1 diabetes mellitus with proliferative retinopathy ([place]) 12/10/2019   Abnormal thyroid function test 12/10/2019   Bruxism (teeth grinding)  Chronic migraine 10/07/2019   Contusion of left hip 07/25/2019   H/O multiple allergies 03/10/2019   Hx of anaphylaxis 03/10/2019   Cough 06/26/2018   PND (post-nasal drip) 06/26/2018   Lumbar strain, initial encounter 04/10/2018   Thoracic myofascial strain 04/10/2018   Vitamin D deficiency 07/04/2017   B12 deficiency 07/04/2017   Abnormal CT scan 06/19/2017   Gastroesophageal reflux disease 06/19/2017   Antiplatelet or antithrombotic long-term use 06/19/2017   Nasal congestion 03/15/2017   Graves disease 02/01/2017   Sleep difficulties 02/01/2017   No energy 02/01/2017   Osteopenia  02/01/2017   Bilateral hand pain 07/16/2016   Acquired trigger finger 07/16/2016   Hypertension, essential 05/08/2016   Foot pain, right 08/30/2014   Shoulder pain, right 08/30/2014   Chronic pain syndrome 01/15/2014   Multiple joint pain 01/15/2014   Lesion of lower eyelid 04/21/2013   Generalized constriction of visual field 03/31/2013   Neoplasm of uncertain behavior of skin of eyelid 03/31/2013   Posterior capsular opacification 03/31/2013   Cyst of left lower eyelid 03/30/2013   Pseudophakia of both eyes 03/30/2013   Prurigo nodularis 02/05/2013   Stasis dermatitis 02/05/2013   Psoriasis 01/21/2013   Trochanteric bursitis 11/10/2012   Achilles tendinitis 07/11/2012   Epiretinal membrane 06/17/2012   Status post cataract extraction 04/30/2012   Pes equinus, acquired 04/23/2012   Tendinitis of ankle 04/23/2012   Proliferative diabetic retinopathy of both eyes ([place]) 01/11/2012   Ongoing use of possibly toxic medication 12/05/2011   Hyperthyroidism 11/09/2010   SINUS TACHYCARDIA 11/08/2010   DERMATITIS, ALLERGIC 07/20/2010   EDEMA 07/12/2010   Dizziness 11/14/2009   ATRIAL FIBRILLATION 07/20/2009   Hx of CABG 06/07/2009   ANGINA, STABLE/EXERTIONAL 06/02/2009   Dyslipidemia, goal LDL below 70 04/26/2009   ACUT MI ANTEROLAT WALL SUBSQT EPIS CARE 04/26/2009   Chest pain 04/26/2009   DIABETIC  RETINOPATHY 04/25/2009   CARPAL TUNNEL SYNDROME, BILATERAL 04/25/2009   TRIGGER FINGER 04/25/2009   MIGRAINE W/O AURA W/INTRACT W/STATUS MIGRAINOSUS 02/19/2008   ALLERGIC RHINITIS, SEASONAL 10/16/2006   CHOLELITHIASIS 10/16/2006   Rheumatoid arthritis ([place]) 10/16/2006   Past Medical History:  Diagnosis Date   ACUT MI ANTEROLAT WALL SUBSQT EPIS CARE    Acute maxillary sinusitis    ALLERGIC RHINITIS, SEASONAL    Anemia    ARTHRITIS, RHEUMATOID    shoulders and hands Enbrel>leg swelling Dr. Estanislado Pandy   Atrial fibrillation [facility])    a. after CABG.   Back pain    Bruxism (teeth  grinding)    CAD, ARTERY BYPASS GRAFT    a. DES to RCA in 2010 then LAD occlusion s/p CABG 3 06/07/2009 with LIMA to LAD, reverse SVG to D1, reverse SVG to distal RCA. b. Cath 05/08/2016 slightly hypodense region in the intermediate branch, however she had excellent flow, FFR was normal. Vein graft to PDA and the posterolateral branch is patent, patent LIMA to LAD, occluded SVG to diagonal.   CARPAL TUNNEL SYNDROME, BILATERAL    CHOLELITHIASIS    Contrast media allergy    DERMATITIS, ALLERGIC    DIABETES MELLITUS, TYPE I    on insulin pump dx'ed age 24 y.o    DIABETIC  RETINOPATHY    Hiatal hernia    HYPERLIPIDEMIA-MIXED    HYPERTHYROIDISM    Dr. Dollene Cleveland   IDA (iron deficiency anemia)    Insulin pump in place    Lymphadenopathy of head and neck    MIGRAINE W/O AURA W/INTRACT W/STATUS MIGRAINOSUS 02/19/2008   PONV (postoperative  nausea and vomiting)    Psoriasis    SINUS TACHYCARDIA 11/08/2010   Sleep apnea    not using machine yet    SVT (supraventricular tachycardia)    after s/p CABG   Tendonitis    TRIGGER FINGER    all fingers b/l hands    URI    Family History  Problem Relation Age of Onset   Depression Mother    Anxiety disorder Mother    Colon polyps Father    Diabetes Father        2   Hypertension Father    Parkinson's disease Father    Stroke Paternal Grandmother    Heart disease Other    Hypertension Other    Hyperlipidemia Other    Depression Other    Migraines Other    Heart attack Neg Hx    Colon cancer Neg Hx    Stomach cancer Neg Hx    Esophageal cancer Neg Hx    Past Surgical History:  Procedure Laterality Date   ABDOMINAL HYSTERECTOMY     endometriomas b/l; total ? cervix removed; no h/o abnormal pap   Caesarean section     CARDIAC CATHETERIZATION N/A 05/08/2016   Procedure: Left Heart Cath and Cors/Grafts Angiography;  Surgeon: Burnell Blanks, MD;  Location: [facility];  Service: Cardiovascular;  Laterality: N/A;   CARPAL  TUNNEL RELEASE     b/l    CARPAL TUNNEL RELEASE Left 06/06/2021   Procedure: CARPAL TUNNEL RELEASE;  Surgeon: Daryll Brod, MD;  Location: [facility];  Service: Orthopedics;  Laterality: Left;  AXILLARY BLOCK   CATARACT EXTRACTION, BILATERAL     CHOLECYSTECTOMY     COLONOSCOPY WITH PROPOFOL N/A 03/25/2020   Procedure: COLONOSCOPY WITH PROPOFOL;  Surgeon: Jonathon Bellows, MD;  Location: [facility] ENDOSCOPY;  Service: Gastroenterology;  Laterality: N/A;   CORONARY ARTERY BYPASS GRAFT     ESOPHAGOGASTRODUODENOSCOPY (EGD) WITH PROPOFOL N/A 03/25/2020   Procedure: ESOPHAGOGASTRODUODENOSCOPY (EGD) WITH PROPOFOL;  Surgeon: Jonathon Bellows, MD;  Location: [facility] ENDOSCOPY;  Service: Gastroenterology;  Laterality: N/A;   EYE SURGERY     laser x 2 for retinopathy    LEFT HEART CATH AND CORS/GRAFTS ANGIOGRAPHY N/A 02/22/2020   Procedure: LEFT HEART CATH AND CORS/GRAFTS ANGIOGRAPHY;  Surgeon: Burnell Blanks, MD;  Location: [facility] [facility];  Service: Cardiovascular;  Laterality: N/A;   TRIGGER FINGER RELEASE     b/l fingers all    ULNAR NERVE TRANSPOSITION Left 06/06/2021   Procedure: ULNAR NERVE DECOMPRESSION;  Surgeon: Daryll Brod, MD;  Location: [place];  Service: Orthopedics;  Laterality: Left;  AXILLARY BLOCK   VITRECTOMY     b/l    Social History   Occupational History   Not on file  Tobacco Use   Smoking status: Never    Passive exposure: Never   Smokeless tobacco: Never  Vaping Use   Vaping Use: Never used  Substance and Sexual Activity   Alcohol use: Yes    Comment: rarely 1 every 6 months   Drug use: No   Sexual activity: Not Currently    Partners: Male    Birth control/protection: None

## 2023-03-27 ENCOUNTER — Ambulatory Visit: Payer: Commercial Managed Care - PPO | Admitting: Physical Therapy

## 2023-03-27 ENCOUNTER — Other Ambulatory Visit (HOSPITAL_COMMUNITY): Payer: Self-pay

## 2023-03-29 ENCOUNTER — Other Ambulatory Visit: Payer: Self-pay

## 2023-04-01 ENCOUNTER — Ambulatory Visit: Payer: Commercial Managed Care - PPO

## 2023-04-02 ENCOUNTER — Other Ambulatory Visit: Payer: Self-pay

## 2023-04-03 ENCOUNTER — Encounter: Payer: Self-pay | Admitting: Physical Therapy

## 2023-04-04 ENCOUNTER — Encounter: Payer: Self-pay | Admitting: Sports Medicine

## 2023-04-04 ENCOUNTER — Ambulatory Visit: Payer: Commercial Managed Care - PPO | Admitting: Sports Medicine

## 2023-04-04 DIAGNOSIS — M25552 Pain in left hip: Secondary | ICD-10-CM

## 2023-04-04 DIAGNOSIS — M25551 Pain in right hip: Secondary | ICD-10-CM

## 2023-04-04 DIAGNOSIS — M76899 Other specified enthesopathies of unspecified lower limb, excluding foot: Secondary | ICD-10-CM | POA: Diagnosis not present

## 2023-04-04 DIAGNOSIS — R29898 Other symptoms and signs involving the musculoskeletal system: Secondary | ICD-10-CM | POA: Diagnosis not present

## 2023-04-04 NOTE — Progress Notes (Signed)
She stated her hip is a little better.  She has been doing the HEP. She stated it was a small improvement but she will take anything better than last week.

## 2023-04-04 NOTE — Progress Notes (Signed)
[name] - 57 y.o. female MRN [id number]  Date of birth: 1966-09-13  Office Visit Note: Visit Date: 04/04/2023 PCP: Lisa Pitcher, NP Referred by: Lisa Pitcher, NP  Subjective: Chief Complaint  Patient presents with   Right Hip - Follow-up   HPI: [name] is a pleasant 57 y.o. female who presents today for follow-up of right posterior buttock pain and bilateral hip pain.  At last visit we did a trial of extracorporeal shockwave therapy and gave her some home exercises to perform.  She has been doing these.  She does feel like she is about 15-20% improved after the first treatment of ECSWT.  No pain with the home exercises, but she does feel that her right hip is weaker than the left.  Pertinent ROS were reviewed with the patient and found to be negative unless otherwise specified above in HPI.   Assessment & Plan: Visit Diagnoses:  1. Hamstring tendinitis   2. Weakness of right hip   3. Bilateral hip pain    Plan: Discussed with [name] the nature of her pain which is proximal hamstring tendinopathy as well as some weakness of the gluteal muscles of the right hip.  She did get some relief from the first trial of extracorporeal shockwave therapy, we did repeat this again today.  She will continue her home exercise strengthening program to help the strength become symmetric of both hips.  Did proceed with an IM Toradol injection to help control the pain as well.  As she is finding this beneficial, will be present for additional ECSWT.  We did discuss the role of a ultrasound-guided ischial tuberosity injection at some point if she is not making the progress she wishes with the above treatment modalities.  We will hold on this for now.   Treatment considerations: -Nitroglycerin patch protocol -Low-dose oral prednisone  Follow-up: Return in about 1 week (around 04/11/2023) for with [name] for R-lateral hip pain.   Meds & Orders: No orders of the defined types were placed in  this encounter.  No orders of the defined types were placed in this encounter.    Procedures:  Procedure: ECSWT Indications:  Ischial Bursitis/Proximal hamstring tendinosis   Procedure Details Consent: Risks of procedure as well as the alternatives and risks of each were explained to the patient.  Verbal consent for procedure obtained. Time Out: Verified patient identification, verified procedure, site was marked, verified correct patient position. The area was cleaned with alcohol swab.     The right ischial tuberosity, hamstring origin, and gluteus med/min was targeted for Extracorporeal shockwave therapy.    Preset: Trochanteric/Ischial Bursitis Power Level: 120 mJ Frequency: 12 Hz Impulse/cycles: 3000 Head size: Regular   Patient tolerated procedure well without immediate complications.     - 1cc of Toradol 30 minutes/mL was administered in the right gluteal musculature today  Clinical History: No specialty comments available.  She reports that she has never smoked. She has never been exposed to tobacco smoke. She has never used smokeless tobacco.  Recent Labs    05/15/22 1557  HGBA1C 7.0*    Objective:   Vital Signs: There were no vitals taken for this visit.  Physical Exam  Gen: Well-appearing, in no acute distress; non-toxic CV: Regular Rate. Well-perfused. Warm.  Resp: Breathing unlabored on room air; no wheezing. Psych: Fluid speech in conversation; appropriate affect; normal thought process Neuro: Sensation intact throughout. No gross coordination deficits.   Ortho Exam - Right hip: Mild TTP  over the right greater trochanter as well as the ischial tuberosity.  Still with 4/5 weakness with resisted hip abduction on the right.  No overlying soft tissue or swelling.  Imaging: No results found.  Past Medical/Family/Surgical/Social History: Medications & Allergies reviewed per EMR, new medications updated. Patient Active Problem List   Diagnosis Date Noted    Myofascial pain dysfunction syndrome 03/22/2023   Left foot pain 12/05/2022   Urinary urgency 10/23/2022   Pyelonephritis 10/23/2022   Premature atrial contractions 05/15/2022   PVC's (premature ventricular contractions) 05/15/2022   Low hemoglobin 01/16/2022   Right ear pain 01/09/2022   Sore throat 01/09/2022   OSA on CPAP 08/02/2021   Abnormal MRI, lumbar spine 03/20/2021   Abnormal MRI, thoracic spine 03/20/2021   Chronic mid back pain 03/17/2021   Chronic midline low back pain with left-sided sciatica 03/17/2021   Type 1 diabetes mellitus with diabetic polyneuropathy 02/17/2021   Severe obstructive sleep apnea-hypopnea syndrome 02/17/2021   Chronic diastolic CHF (congestive heart failure) 12/19/2020   Chronic back pain 12/19/2020   Chronic abdominal pain [id number]   Acute diastolic heart failure [id number]   Diabetic retinopathy associated with type 1 diabetes mellitus 09/27/2020   Diabetic retinopathy 09/27/2020   Graves' disease 09/27/2020   Chronic coronary artery disease 09/27/2020   Hypertension associated with diabetes 09/14/2020   Obesity (BMI 30-39.9) 09/14/2020   Preventative health care 09/14/2020   Aortic atherosclerosis 09/14/2020   Snoring 05/12/2020   Lung nodule 03/10/2020   Colitis 03/10/2020   Type 1 diabetes mellitus with proliferative retinopathy 12/10/2019   Abnormal thyroid function test 12/10/2019   Bruxism (teeth grinding)    Chronic migraine 10/07/2019   Contusion of left hip 07/25/2019   H/O multiple allergies 03/10/2019   Hx of anaphylaxis 03/10/2019   Cough 06/26/2018   PND (post-nasal drip) 06/26/2018   Lumbar strain, initial encounter 04/10/2018   Thoracic myofascial strain 04/10/2018   Vitamin D deficiency 07/04/2017   B12 deficiency 07/04/2017   Abnormal CT scan 06/19/2017   Gastroesophageal reflux disease 06/19/2017   Antiplatelet or antithrombotic long-term use 06/19/2017   Nasal congestion 03/15/2017   Graves disease 02/01/2017    Sleep difficulties 02/01/2017   No energy 02/01/2017   Osteopenia 02/01/2017   Bilateral hand pain 07/16/2016   Acquired trigger finger 07/16/2016   Hypertension, essential 05/08/2016   Foot pain, right 08/30/2014   Shoulder pain, right 08/30/2014   Chronic pain syndrome 01/15/2014   Multiple joint pain 01/15/2014   Lesion of lower eyelid 04/21/2013   Generalized constriction of visual field 03/31/2013   Neoplasm of uncertain behavior of skin of eyelid 03/31/2013   Posterior capsular opacification 03/31/2013   Cyst of left lower eyelid 03/30/2013   Pseudophakia of both eyes 03/30/2013   Prurigo nodularis 02/05/2013   Stasis dermatitis 02/05/2013   Psoriasis 01/21/2013   Trochanteric bursitis 11/10/2012   Achilles tendinitis 07/11/2012   Epiretinal membrane 06/17/2012   Status post cataract extraction 04/30/2012   Pes equinus, acquired 04/23/2012   Tendinitis of ankle 04/23/2012   Proliferative diabetic retinopathy of both eyes 01/11/2012   Ongoing use of possibly toxic medication 12/05/2011   Hyperthyroidism 11/09/2010   SINUS TACHYCARDIA 11/08/2010   DERMATITIS, ALLERGIC 07/20/2010   EDEMA 07/12/2010   Dizziness 11/14/2009   ATRIAL FIBRILLATION 07/20/2009   Hx of CABG 06/07/2009   ANGINA, STABLE/EXERTIONAL 06/02/2009   Dyslipidemia, goal LDL below 70 04/26/2009   ACUT MI ANTEROLAT WALL SUBSQT EPIS CARE 04/26/2009   Chest pain 04/26/2009  DIABETIC  RETINOPATHY 04/25/2009   CARPAL TUNNEL SYNDROME, BILATERAL 04/25/2009   TRIGGER FINGER 04/25/2009   MIGRAINE W/O AURA W/INTRACT W/STATUS MIGRAINOSUS 02/19/2008   ALLERGIC RHINITIS, SEASONAL 10/16/2006   CHOLELITHIASIS 10/16/2006   Rheumatoid arthritis 10/16/2006   Past Medical History:  Diagnosis Date   ACUT MI ANTEROLAT WALL SUBSQT EPIS CARE    Acute maxillary sinusitis    ALLERGIC RHINITIS, SEASONAL    Anemia    ARTHRITIS, RHEUMATOID    shoulders and hands Enbrel>leg swelling Dr. Estanislado Pandy   Atrial fibrillation  [facility])    a. after CABG.   Back pain    Bruxism (teeth grinding)    CAD, ARTERY BYPASS GRAFT    a. DES to RCA in 2010 then LAD occlusion s/p CABG 3 06/07/2009 with LIMA to LAD, reverse SVG to D1, reverse SVG to distal RCA. b. Cath 05/08/2016 slightly hypodense region in the intermediate branch, however she had excellent flow, FFR was normal. Vein graft to PDA and the posterolateral branch is patent, patent LIMA to LAD, occluded SVG to diagonal.   CARPAL TUNNEL SYNDROME, BILATERAL    CHOLELITHIASIS    Contrast media allergy    DERMATITIS, ALLERGIC    DIABETES MELLITUS, TYPE I    on insulin pump dx'ed age 48 y.o    DIABETIC  RETINOPATHY    Hiatal hernia    HYPERLIPIDEMIA-MIXED    HYPERTHYROIDISM    Dr. Dollene Cleveland   IDA (iron deficiency anemia)    Insulin pump in place    Lymphadenopathy of head and neck    MIGRAINE W/O AURA W/INTRACT W/STATUS MIGRAINOSUS 02/19/2008   PONV (postoperative nausea and vomiting)    Psoriasis    SINUS TACHYCARDIA 11/08/2010   Sleep apnea    not using machine yet    SVT (supraventricular tachycardia)    after s/p CABG   Tendonitis    TRIGGER FINGER    all fingers b/l hands    URI    Family History  Problem Relation Age of Onset   Depression Mother    Anxiety disorder Mother    Colon polyps Father    Diabetes Father        2   Hypertension Father    Parkinson's disease Father    Stroke Paternal Grandmother    Heart disease Other    Hypertension Other    Hyperlipidemia Other    Depression Other    Migraines Other    Heart attack Neg Hx    Colon cancer Neg Hx    Stomach cancer Neg Hx    Esophageal cancer Neg Hx    Past Surgical History:  Procedure Laterality Date   ABDOMINAL HYSTERECTOMY     endometriomas b/l; total ? cervix removed; no h/o abnormal pap   Caesarean section     CARDIAC CATHETERIZATION N/A 05/08/2016   Procedure: Left Heart Cath and Cors/Grafts Angiography;  Surgeon: Burnell Blanks, MD;  Location: [place];   Service: Cardiovascular;  Laterality: N/A;   CARPAL TUNNEL RELEASE     b/l    CARPAL TUNNEL RELEASE Left 06/06/2021   Procedure: CARPAL TUNNEL RELEASE;  Surgeon: Daryll Brod, MD;  Location: [place];  Service: Orthopedics;  Laterality: Left;  AXILLARY BLOCK   CATARACT EXTRACTION, BILATERAL     CHOLECYSTECTOMY     COLONOSCOPY WITH PROPOFOL N/A 03/25/2020   Procedure: COLONOSCOPY WITH PROPOFOL;  Surgeon: Jonathon Bellows, MD;  Location: [facility] ENDOSCOPY;  Service: Gastroenterology;  Laterality: N/A;   CORONARY  ARTERY BYPASS GRAFT     ESOPHAGOGASTRODUODENOSCOPY (EGD) WITH PROPOFOL N/A 03/25/2020   Procedure: ESOPHAGOGASTRODUODENOSCOPY (EGD) WITH PROPOFOL;  Surgeon: Jonathon Bellows, MD;  Location[facility] ENDOSCOPY;  Service: Gastroenterology;  Laterality: N/A;   EYE SURGERY     laser x 2 for retinopathy    LEFT HEART CATH AND CORS/GRAFTS ANGIOGRAPHY N/A 02/22/2020   Procedure: LEFT HEART CATH AND CORS/GRAFTS ANGIOGRAPHY;  Surgeon: Burnell Blanks, MD;  Location: [facility];  Service: Cardiovascular;  Laterality: N/A;   TRIGGER FINGER RELEASE     b/l fingers all    ULNAR NERVE TRANSPOSITION Left 06/06/2021   Procedure: ULNAR NERVE DECOMPRESSION;  Surgeon: Daryll Brod, MD;  Location: [place];  Service: Orthopedics;  Laterality: Left;  AXILLARY BLOCK   VITRECTOMY     b/l    Social History   Occupational History   Not on file  Tobacco Use   Smoking status: Never    Passive exposure: Never   Smokeless tobacco: Never  Vaping Use   Vaping Use: Never used  Substance and Sexual Activity   Alcohol use: Yes    Comment: rarely 1 every 6 months   Drug use: No   Sexual activity: Not Currently    Partners: Male    Birth control/protection: None

## 2023-04-08 ENCOUNTER — Other Ambulatory Visit: Payer: Self-pay | Admitting: Physician Assistant

## 2023-04-09 ENCOUNTER — Other Ambulatory Visit (HOSPITAL_COMMUNITY): Payer: Self-pay

## 2023-04-09 ENCOUNTER — Other Ambulatory Visit: Payer: Self-pay

## 2023-04-09 MED ORDER — CYCLOBENZAPRINE HCL 10 MG PO TABS
10.0000 mg | ORAL_TABLET | Freq: Every evening | ORAL | 0 refills | Status: DC | PRN
  Filled 2023-04-09 – 2023-04-11 (×2): qty 30, 30d supply, fill #0

## 2023-04-09 NOTE — Telephone Encounter (Signed)
Last Fill: 03/14/2023  Next Visit: 06/13/2023  Last Visit: 03/14/2023  Dx: Other insomnia   Current Dose per office note on 03/14/2023: Flexeril 10 mg 1 tablet at bedtime   Okay to refill Flexeril?

## 2023-04-10 ENCOUNTER — Encounter: Payer: Self-pay | Admitting: Physical Therapy

## 2023-04-11 ENCOUNTER — Other Ambulatory Visit: Payer: Self-pay

## 2023-04-11 ENCOUNTER — Ambulatory Visit: Payer: Commercial Managed Care - PPO | Admitting: Family Medicine

## 2023-04-11 ENCOUNTER — Encounter: Payer: Self-pay | Admitting: Family Medicine

## 2023-04-11 ENCOUNTER — Other Ambulatory Visit: Payer: Self-pay | Admitting: Cardiovascular Disease

## 2023-04-11 VITALS — BP 130/60 | HR 88 | Temp 97.9°F | Ht 63.0 in | Wt 175.2 lb

## 2023-04-11 DIAGNOSIS — R051 Acute cough: Secondary | ICD-10-CM

## 2023-04-11 MED ORDER — AZITHROMYCIN 250 MG PO TABS
ORAL_TABLET | ORAL | 0 refills | Status: DC
  Filled 2023-04-11: qty 6, 5d supply, fill #0

## 2023-04-11 MED ORDER — GUAIFENESIN-CODEINE 100-10 MG/5ML PO SYRP
5.0000 mL | ORAL_SOLUTION | Freq: Every evening | ORAL | 0 refills | Status: DC | PRN
  Filled 2023-04-11: qty 180, 18d supply, fill #0

## 2023-04-11 MED ORDER — PREDNISONE 20 MG PO TABS
ORAL_TABLET | ORAL | 0 refills | Status: DC
  Filled 2023-04-11: qty 15, 7d supply, fill #0

## 2023-04-11 MED ORDER — ALBUTEROL SULFATE HFA 108 (90 BASE) MCG/ACT IN AERS
2.0000 | INHALATION_SPRAY | RESPIRATORY_TRACT | 0 refills | Status: DC | PRN
  Filled 2023-04-11: qty 6.7, 25d supply, fill #0

## 2023-04-11 NOTE — Assessment & Plan Note (Signed)
Acute, potentially allergic or viral in origin, now with new fever more concerning for possible bacterial superinfection.  Patient states she gets allergic asthma this time of year. Will treat with prednisone taper, albuterol inhaler 2 puffs every 4-6 hours as needed, cough suppressant at night.  If she is not improving within the next 24 to 48 hours she will start azithromycin 5-day course to cover for possible bacterial superinfection. Continue allergy medications. Return and ER precautions provided.

## 2023-04-11 NOTE — Patient Instructions (Signed)
Complete prednisone taper.  Use Mucinex DM during the day for cough and can use prescription cough suppressant at night. Call if shortness of breath worsening, if severe go to the emergency department.

## 2023-04-11 NOTE — Progress Notes (Signed)
Patient ID: [name], female    DOB: 08-18-1966, 57 y.o.   MRN: [id number]  This visit was conducted in person.  BP 130/60   Pulse 88   Temp 97.9 F (36.6 C) (Temporal)   Ht  (1.6 m)   Wt 175 lb 4 oz (79.5 kg)   SpO2 96%   BMI 31.04 kg/m    CC:  Chief Complaint  Patient presents with   Cough    X 2 weeks   Sore Throat   Ear Pain    Right   Nasal Congestion    Subjective:   HPI: [name] is a 57 y.o. female patient of [facility] with history of atrial fibrillation, CHF,  diabetes type 1  presenting on 04/11/2023 for Cough (X 2 weeks), Sore Throat, Ear Pain (Right), and Nasal Congestion    Date of onset:  2 weeks  Initial symptoms included cough  sore throat,  nasal congestion Symptoms progressed to right ear pain and  worsening cough... now productive.   No recent  subjective fever in last few days, chills.  Facial pain and pressure. Mild  SOB,  wheeze.    Sick contacts:  works at hospital. COVID testing:   none    She has tried to treat with  mucinex daya nd night OTC.     No history of chronic lung disease such as  ( has history of allergic asthma) or COPD. Non-smoker.   Typically has allergic asthma symptoms this time of year.  Using albuterol  as needed in past and anti histamine claritin.      Relevant past medical, surgical, family and social history reviewed and updated as indicated. Interim medical history since our last visit reviewed. Allergies and medications reviewed and updated. Outpatient Medications Prior to Visit  Medication Sig Dispense Refill   Ascorbic Acid 500 MG CAPS Take by mouth.     aspirin EC 81 MG tablet Take 81 mg by mouth daily.     Blood Glucose Monitoring Suppl (FREESTYLE FREEDOM LITE) w/Device KIT Use as directed by physician to check blood sugar 1 kit 0   CHELATED MAGNESIUM PO Take 250 mg by mouth daily.     clopidogrel (PLAVIX) 75 MG tablet TAKE 1 TABLET BY MOUTH ONCE DAILY 90 tablet 3   Continuous Blood  Gluc Sensor (DEXCOM G6 SENSOR) MISC Dispense and use as directed.  Change sensor every 10 days. 9 each 99   Continuous Blood Gluc Transmit (DEXCOM G6 TRANSMITTER) MISC Dispense and use as directed.  Change transmitter every 90 days. 1 each 99   cyclobenzaprine (FLEXERIL) 10 MG tablet Take 1 tablet (10 mg total) by mouth at bedtime as needed for muscle spasms. 30 tablet 0   DULoxetine (CYMBALTA) 20 MG capsule Take 1 capsule (20 mg total) by mouth at bedtime for 7 days, THEN 2 capsules (40 mg total) at bedtime for 7 days, THEN 3 capsules (60 mg total) at bedtime for chronic pain. 90 capsule 5   EPINEPHrine 0.3 mg/0.3 mL IJ SOAJ injection Inject 0.3 mg into the muscle as needed for anaphylaxis. 2 each 2   glucose blood (FREESTYLE LITE) test strip Use to check blood sugar 3 time(s) daily 100 each 99   insulin detemir (LEVEMIR) 100 UNIT/ML injection Inject 36 units into the skin once daily in the event of insulin pump failure. 10 mL 0   insulin lispro (HUMALOG) 100 UNIT/ML injection Inject 0.9 mLs (90 Units total) into the  skin daily in insulin pump. 30 mL 5   Insulin Syringe-Needle U-100 (BD INSULIN SYRINGE U/F) 31G X 5/16" 0.3 ML MISC use with insulin injections 3 times daily in the event of insulin pump failure. 100 each 12   isosorbide mononitrate (IMDUR) 60 MG 24 hr tablet Take 1 tablet (60 mg total) by mouth daily. 90 tablet 3   ketoconazole (NIZORAL) 2 % shampoo Apply 1 Application topically 3 (three) times a week. Wash scalp 3 times weekly, leave in for 10 minutes then rinse out 120 mL 2   Krill Oil 500 MG CAPS Take 500 mg by mouth daily.     Lancets (FREESTYLE) lancets Use to check blood sugar 3 time(s) daily. 100 each 99   leflunomide (ARAVA) 20 MG tablet Take 1 tablet (20 mg total) by mouth daily. 90 tablet 0   linaclotide (LINZESS) 290 MCG CAPS capsule Take 1 capsule (290 mcg total) by mouth daily before breakfast. 90 capsule 3   metoprolol tartrate (LOPRESSOR) 50 MG tablet Take 0.5 tablets (25  mg total) by mouth 2 (two) times daily. 90 tablet 3   mupirocin ointment (BACTROBAN) 2 % Apply 1 Application topically as directed. Bid to tid to open sores in scalp 22 g 0   ondansetron (ZOFRAN) 4 MG tablet Take 1 tablet (4 mg total) by mouth every 6 (six) hours as needed for nausea or vomiting. 90 tablet 3   ondansetron (ZOFRAN-ODT) 4 MG disintegrating tablet Take 4 mg by mouth every 8 (eight) hours as needed.     pantoprazole (PROTONIX) 40 MG tablet Take 1 tablet (40 mg total) by mouth 2 (two) times daily. 180 tablet 3   rosuvastatin (CRESTOR) 20 MG tablet Take 1 tablet (20 mg total) by mouth daily. 90 tablet 2   Semaglutide, 1 MG/DOSE, (OZEMPIC, 1 MG/DOSE,) 4 MG/3ML SOPN Inject 1 mg into the skin once a week. 3 mL 5   torsemide (DEMADEX) 20 MG tablet Take 1 tablet (20 mg total) by mouth daily. 90 tablet 3   Upadacitinib ER (RINVOQ) 15 MG TB24 Take 1 tablet (15 mg) by mouth daily. 30 tablet 2   albuterol (VENTOLIN HFA) 108 (90 Base) MCG/ACT inhaler Inhale 2 puffs into the lungs every 4 (four) hours as needed for wheezing or shortness of breath. 18 g 0   promethazine (PHENERGAN) 25 MG tablet TAKE 1/2- 1 TABLETS (12.5-25 MG TOTAL) BY MOUTH 2 TIMES DAILY AS NEEDED FOR NAUSEA OR VOMITING. 60 tablet 2   No facility-administered medications prior to visit.     Per HPI unless specifically indicated in ROS section below Review of Systems  Constitutional:  Negative for fatigue and fever.  HENT:  Positive for congestion, ear pain, postnasal drip, sinus pressure and sore throat.   Eyes:  Negative for pain.  Respiratory:  Positive for cough. Negative for shortness of breath.   Cardiovascular:  Negative for chest pain, palpitations and leg swelling.  Gastrointestinal:  Negative for abdominal pain.  Genitourinary:  Negative for dysuria and vaginal bleeding.  Musculoskeletal:  Negative for back pain.  Neurological:  Negative for syncope, light-headedness and headaches.  Psychiatric/Behavioral:   Negative for dysphoric mood.    Objective:  BP 130/60   Pulse 88   Temp 97.9 F (36.6 C) (Temporal)   Ht 5\' 3"  (1.6 m)   Wt 175 lb 4 oz (79.5 kg)   SpO2 96%   BMI 31.04 kg/m   Wt Readings from Last 3 Encounters:  04/11/23 175 lb 4 oz (79.5  kg)  03/22/23 175 lb 3.2 oz (79.5 kg)  03/14/23 178 lb (80.7 kg)      Physical Exam Constitutional:      General: She is not in acute distress.    Appearance: She is well-developed. She is not ill-appearing or toxic-appearing.  HENT:     Head: Normocephalic.     Right Ear: Hearing, ear canal and external ear normal. A middle ear effusion is present. Tympanic membrane is not erythematous, retracted or bulging.     Left Ear: Hearing, ear canal and external ear normal. A middle ear effusion is present. Tympanic membrane is not erythematous, retracted or bulging.     Nose: Mucosal edema and rhinorrhea present.     Right Turbinates: Swollen and pale.     Left Turbinates: Swollen and pale.     Right Sinus: No maxillary sinus tenderness or frontal sinus tenderness.     Left Sinus: No maxillary sinus tenderness or frontal sinus tenderness.     Mouth/Throat:     Pharynx: Uvula midline.  Eyes:     General: Lids are normal. Lids are everted, no foreign bodies appreciated.     Conjunctiva/sclera: Conjunctivae normal.     Pupils: Pupils are equal, round, and reactive to light.  Neck:     Thyroid: No thyroid mass or thyromegaly.     Vascular: No carotid bruit.     Trachea: Trachea normal.  Cardiovascular:     Rate and Rhythm: Normal rate and regular rhythm.     Pulses: Normal pulses.     Heart sounds: Normal heart sounds, S1 normal and S2 normal. No murmur heard.    No friction rub. No gallop.  Pulmonary:     Effort: Pulmonary effort is normal. No tachypnea or respiratory distress.     Breath sounds: Normal breath sounds. No decreased breath sounds, wheezing, rhonchi or rales.     Comments: Constant dry hacking cough Musculoskeletal:      Cervical back: Normal range of motion and neck supple.  Skin:    General: Skin is warm and dry.     Findings: No rash.  Neurological:     Mental Status: She is alert.  Psychiatric:        Mood and Affect: Mood is not anxious or depressed.        Speech: Speech normal.        Behavior: Behavior normal. Behavior is cooperative.        Judgment: Judgment normal.       Results for orders placed or performed in visit on 03/14/23  CBC with Differential/Platelet  Result Value Ref Range   WBC 3.6 (L) 3.8 - 10.8 Thousand/uL   RBC 4.10 3.80 - 5.10 Million/uL   Hemoglobin 12.1 11.7 - 15.5 g/dL   HCT 16.1 09.6 - 04.5 %   MCV 86.3 80.0 - 100.0 fL   MCH 29.5 27.0 - 33.0 pg   MCHC 34.2 32.0 - 36.0 g/dL   RDW 40.9 81.1 - 91.4 %   Platelets 219 140 - 400 Thousand/uL   MPV 9.6 7.5 - 12.5 fL   Neutro Abs 1,595 1,500 - 7,800 cells/uL   Lymphs Abs 1,526 850 - 3,900 cells/uL   Absolute Monocytes 367 200 - 950 cells/uL   Eosinophils Absolute 90 15 - 500 cells/uL   Basophils Absolute 22 0 - 200 cells/uL   Neutrophils Relative % 44.3 %   Total Lymphocyte 42.4 %   Monocytes Relative 10.2 %   Eosinophils Relative 2.5 %  Basophils Relative 0.6 %  COMPLETE METABOLIC PANEL WITH GFR  Result Value Ref Range   Glucose, Bld 121 (H) 65 - 99 mg/dL   BUN 14 7 - 25 mg/dL   Creat 1.61 0.96 - 0.45 mg/dL   eGFR 82 > OR = 60 WU/JWJ/1.91Y7   BUN/Creatinine Ratio SEE NOTE: 6 - 22 (calc)   Sodium 138 135 - 146 mmol/L   Potassium 4.0 3.5 - 5.3 mmol/L   Chloride 100 98 - 110 mmol/L   CO2 28 20 - 32 mmol/L   Calcium 9.4 8.6 - 10.4 mg/dL   Total Protein 6.5 6.1 - 8.1 g/dL   Albumin 4.5 3.6 - 5.1 g/dL   Globulin 2.0 1.9 - 3.7 g/dL (calc)   AG Ratio 2.3 1.0 - 2.5 (calc)   Total Bilirubin 0.3 0.2 - 1.2 mg/dL   Alkaline phosphatase (APISO) 66 37 - 153 U/L   AST 29 10 - 35 U/L   ALT 26 6 - 29 U/L  TSH  Result Value Ref Range   TSH 1.04 mIU/L   *Note: Due to a large number of results and/or encounters for the  requested time period, some results have not been displayed. A complete set of results can be found in Results Review.    Assessment and Plan  Acute cough Assessment & Plan: Acute, potentially allergic or viral in origin, now with new fever more concerning for possible bacterial superinfection.  Patient states she gets allergic asthma this time of year. Will treat with prednisone taper, albuterol inhaler 2 puffs every 4-6 hours as needed, cough suppressant at night.  If she is not improving within the next 24 to 48 hours she will start azithromycin 5-day course to cover for possible bacterial superinfection. Continue allergy medications. Return and ER precautions provided.   Other orders -     Albuterol Sulfate HFA; Inhale 2 puffs into the lungs every 4 (four) hours as needed for wheezing or shortness of breath.  Dispense: 6.7 g; Refill: 0 -     predniSONE; 3 tabs by mouth daily x 3 days, then 2 tabs by mouth daily x 2 days then 1 tab by mouth daily x 2 days  Dispense: 15 tablet; Refill: 0 -     guaiFENesin-Codeine; Take 5-10 mLs by mouth at bedtime as needed for cough.  Dispense: 180 mL; Refill: 0 -     Azithromycin; 2 tab po x 1 day then 1 tab po daily  Dispense: 6 tablet; Refill: 0    No follow-ups on file.   Kerby Nora, MD

## 2023-04-12 ENCOUNTER — Ambulatory Visit: Payer: Commercial Managed Care - PPO | Admitting: Sports Medicine

## 2023-04-12 ENCOUNTER — Encounter: Payer: Self-pay | Admitting: Sports Medicine

## 2023-04-12 DIAGNOSIS — M7061 Trochanteric bursitis, right hip: Secondary | ICD-10-CM | POA: Diagnosis not present

## 2023-04-12 DIAGNOSIS — M76899 Other specified enthesopathies of unspecified lower limb, excluding foot: Secondary | ICD-10-CM | POA: Diagnosis not present

## 2023-04-12 DIAGNOSIS — R29898 Other symptoms and signs involving the musculoskeletal system: Secondary | ICD-10-CM

## 2023-04-12 NOTE — Progress Notes (Signed)
[name] - 57 y.o. female MRN [id number]  Date of birth: 1966-05-21  Office Visit Note: Visit Date: 04/12/2023 PCP: Eden Emms, NP Referred by: Eden Emms, NP  Subjective: Chief Complaint  Patient presents with   Right Hip - Follow-up   HPI: [name] is a pleasant 57 y.o. female who presents today for right posterior buttock and lateral hip pain.  She has done 2 trials of extracorporeal shockwave therapy.  Does at this point she is about 25 to 30% improved.  She unfortunately did have a fall where she bruised her knee and hip and flared up her back on Sunday, but is starting to feel better.  She has been doing the home rehab for the hip, but did stop here this week after she had her follow-up. She currently had a flareup of her allergies and so is on a 6-day prednisone taper currently.  Pertinent ROS were reviewed with the patient and found to be negative unless otherwise specified above in HPI.   Assessment & Plan: Visit Diagnoses:  1. Hamstring tendinitis   2. Weakness of right hip   3. Trochanteric bursitis, right hip    Plan: She is finding improvement from her proximal hamstring tendinopathy and ischial tuberosity bursitis.  We did repeat shockwave treatment today.  She will continue her 6-day prednisone taper prescribed by her family medicine physician.  Hopefully this actually helps her hip as well.  Starting on Monday I would like her to resume her home hip strengthening to help with her lateral hip abductors and posterior hip musculature.  She will follow-up in 1-2-week for repeat treatment and evaluation.  Did discuss the role for ultrasound-guided ischial tuberosity injection; could also consider nitroglycerin patch protocol.  Follow-up: Return in about 1 week (around 04/19/2023) for f/u right post-lat hip pain.   Meds & Orders: No orders of the defined types were placed in this encounter.  No orders of the defined types were placed in this encounter.     Procedures: Procedure: ECSWT Indications:  Ischial Bursitis/Proximal hamstring tendinosis   Procedure Details Consent: Risks of procedure as well as the alternatives and risks of each were explained to the patient.  Verbal consent for procedure obtained. Time Out: Verified patient identification, verified procedure, site was marked, verified correct patient position. The area was cleaned with alcohol swab.     The right ischial tuberosity, hamstring origin, and gluteus med/min was targeted for Extracorporeal shockwave therapy.    Preset: Trochanteric/Ischial Bursitis Power Level: 120 mJ Frequency: 12 Hz Impulse/cycles: 3200 Head size: Regular   Patient tolerated procedure well without immediate complications.      Clinical History: No specialty comments available.  She reports that she has never smoked. She has never been exposed to tobacco smoke. She has never used smokeless tobacco.  Recent Labs    05/15/22 1557  HGBA1C 7.0*    Objective:   Vital Signs: There were no vitals taken for this visit.  Physical Exam  Gen: Well-appearing, in no acute distress; non-toxic CV: Well-perfused. Warm.  Resp: Breathing unlabored on room air; no wheezing. Psych: Fluid speech in conversation; appropriate affect; normal thought process Neuro: Sensation intact throughout. No gross coordination deficits.   Ortho Exam - Right hip: Mild TTP over the right greater trochanter as well as the ischial tuberosity and proximal posterior hamstring tendon origin. No overlying soft tissue or swelling.   Imaging: No results found.  Past Medical/Family/Surgical/Social History: Medications & Allergies reviewed per  EMR, new medications updated. Patient Active Problem List   Diagnosis Date Noted   Myofascial pain dysfunction syndrome 03/22/2023   Left foot pain 12/05/2022   Urinary urgency 10/23/2022   Pyelonephritis 10/23/2022   Premature atrial contractions 05/15/2022   PVC's (premature  ventricular contractions) 05/15/2022   Low hemoglobin 01/16/2022   Right ear pain 01/09/2022   Sore throat 01/09/2022   OSA on CPAP 08/02/2021   Abnormal MRI, lumbar spine 03/20/2021   Abnormal MRI, thoracic spine 03/20/2021   Chronic mid back pain 03/17/2021   Chronic midline low back pain with left-sided sciatica 03/17/2021   Type 1 diabetes mellitus with diabetic polyneuropathy 02/17/2021   Severe obstructive sleep apnea-hypopnea syndrome 02/17/2021   Chronic diastolic CHF (congestive heart failure) 12/19/2020   Chronic back pain 12/19/2020   Chronic abdominal pain 12/19/2020   Acute diastolic heart failure 12/09/2020   Diabetic retinopathy associated with type 1 diabetes mellitus 09/27/2020   Diabetic retinopathy 09/27/2020   Graves' disease 09/27/2020   Chronic coronary artery disease 09/27/2020   Hypertension associated with diabetes 09/14/2020   Obesity (BMI 30-39.9) 09/14/2020   Preventative health care 09/14/2020   Aortic atherosclerosis 09/14/2020   Snoring 05/12/2020   Lung nodule 03/10/2020   Colitis 03/10/2020   Type 1 diabetes mellitus with proliferative retinopathy 12/10/2019   Abnormal thyroid function test 12/10/2019   Bruxism (teeth grinding)    Chronic migraine 10/07/2019   Contusion of left hip 07/25/2019   H/O multiple allergies 03/10/2019   Hx of anaphylaxis 03/10/2019   Cough 06/26/2018   PND (post-nasal drip) 06/26/2018   Lumbar strain, initial encounter 04/10/2018   Thoracic myofascial strain 04/10/2018   Vitamin D deficiency 07/04/2017   B12 deficiency 07/04/2017   Abnormal CT scan 06/19/2017   Gastroesophageal reflux disease 06/19/2017   Antiplatelet or antithrombotic long-term use 06/19/2017   Nasal congestion 03/15/2017   Graves disease 02/01/2017   Sleep difficulties 02/01/2017   No energy 02/01/2017   Osteopenia 02/01/2017   Bilateral hand pain 07/16/2016   Acquired trigger finger 07/16/2016   Hypertension, essential 05/08/2016   Foot  pain, right 08/30/2014   Shoulder pain, right 08/30/2014   Chronic pain syndrome 01/15/2014   Multiple joint pain 01/15/2014   Lesion of lower eyelid 04/21/2013   Generalized constriction of visual field 03/31/2013   Neoplasm of uncertain behavior of skin of eyelid 03/31/2013   Posterior capsular opacification 03/31/2013   Cyst of left lower eyelid 03/30/2013   Pseudophakia of both eyes 03/30/2013   Prurigo nodularis 02/05/2013   Stasis dermatitis 02/05/2013   Psoriasis 01/21/2013   Trochanteric bursitis 11/10/2012   Achilles tendinitis 07/11/2012   Epiretinal membrane 06/17/2012   Status post cataract extraction 04/30/2012   Pes equinus, acquired 04/23/2012   Tendinitis of ankle 04/23/2012   Proliferative diabetic retinopathy of both eyes 01/11/2012   Ongoing use of possibly toxic medication 12/05/2011   Hyperthyroidism 11/09/2010   SINUS TACHYCARDIA 11/08/2010   DERMATITIS, ALLERGIC 07/20/2010   EDEMA 07/12/2010   Dizziness 11/14/2009   ATRIAL FIBRILLATION 07/20/2009   Hx of CABG 06/07/2009   ANGINA, STABLE/EXERTIONAL 06/02/2009   Dyslipidemia, goal LDL below 70 04/26/2009   ACUT MI ANTEROLAT WALL SUBSQT EPIS CARE 04/26/2009   Chest pain 04/26/2009   DIABETIC  RETINOPATHY 04/25/2009   CARPAL TUNNEL SYNDROME, BILATERAL 04/25/2009   TRIGGER FINGER 04/25/2009   MIGRAINE W/O AURA W/INTRACT W/STATUS MIGRAINOSUS 02/19/2008   ALLERGIC RHINITIS, SEASONAL 10/16/2006   CHOLELITHIASIS 10/16/2006   Rheumatoid arthritis 10/16/2006   Past  Medical History:  Diagnosis Date   ACUT MI ANTEROLAT WALL SUBSQT EPIS CARE    Acute maxillary sinusitis    ALLERGIC RHINITIS, SEASONAL    Anemia    ARTHRITIS, RHEUMATOID    shoulders and hands Enbrel>leg swelling Dr. Corliss Skains   Atrial fibrillation    a. after CABG.   Back pain    Bruxism (teeth grinding)    CAD, ARTERY BYPASS GRAFT    a. DES to RCA in 2010 then LAD occlusion s/p CABG 3 06/07/2009 with LIMA to LAD, reverse SVG to D1, reverse  SVG to distal RCA. b. Cath 05/08/2016 slightly hypodense region in the intermediate branch, however she had excellent flow, FFR was normal. Vein graft to PDA and the posterolateral branch is patent, patent LIMA to LAD, occluded SVG to diagonal.   CARPAL TUNNEL SYNDROME, BILATERAL    CHOLELITHIASIS    Contrast media allergy    DERMATITIS, ALLERGIC    DIABETES MELLITUS, TYPE I    on insulin pump dx'ed age 13 y.o    DIABETIC  RETINOPATHY    Hiatal hernia    HYPERLIPIDEMIA-MIXED    HYPERTHYROIDISM    Dr. Osborne Casco   IDA (iron deficiency anemia)    Insulin pump in place    Lymphadenopathy of head and neck    MIGRAINE W/O AURA W/INTRACT W/STATUS MIGRAINOSUS 02/19/2008   PONV (postoperative nausea and vomiting)    Psoriasis    SINUS TACHYCARDIA 11/08/2010   Sleep apnea    not using machine yet    SVT (supraventricular tachycardia)    after s/p CABG   Tendonitis    TRIGGER FINGER    all fingers b/l hands    URI    Family History  Problem Relation Age of Onset   Depression Mother    Anxiety disorder Mother    Colon polyps Father    Diabetes Father        2   Hypertension Father    Parkinson's disease Father    Stroke Paternal Grandmother    Heart disease Other    Hypertension Other    Hyperlipidemia Other    Depression Other    Migraines Other    Heart attack Neg Hx    Colon cancer Neg Hx    Stomach cancer Neg Hx    Esophageal cancer Neg Hx    Past Surgical History:  Procedure Laterality Date   ABDOMINAL HYSTERECTOMY     endometriomas b/l; total ? cervix removed; no h/o abnormal pap   Caesarean section     CARDIAC CATHETERIZATION N/A 05/08/2016   Procedure: Left Heart Cath and Cors/Grafts Angiography;  Surgeon: Kathleene Hazel, MD;  Location: [facility];  Service: Cardiovascular;  Laterality: N/A;   CARPAL TUNNEL RELEASE     b/l    CARPAL TUNNEL RELEASE Left 06/06/2021   Procedure: CARPAL TUNNEL RELEASE;  Surgeon: Cindee Salt, MD;  Location: [facility];  Service: Orthopedics;  Laterality: Left;  AXILLARY BLOCK   CATARACT EXTRACTION, BILATERAL     CHOLECYSTECTOMY     COLONOSCOPY WITH PROPOFOL N/A 03/25/2020   Procedure: COLONOSCOPY WITH PROPOFOL;  Surgeon: Wyline Mood, MD;  Location: [facility];  Service: Gastroenterology;  Laterality: N/A;   CORONARY ARTERY BYPASS GRAFT     ESOPHAGOGASTRODUODENOSCOPY (EGD) WITH PROPOFOL N/A 03/25/2020   Procedure: ESOPHAGOGASTRODUODENOSCOPY (EGD) WITH PROPOFOL;  Surgeon: Wyline Mood, MD;  Location: [facility] ENDOSCOPY;  Service: Gastroenterology;  Laterality: N/A;   EYE SURGERY     laser  x 2 for retinopathy    LEFT HEART CATH AND CORS/GRAFTS ANGIOGRAPHY N/A 02/22/2020   Procedure: LEFT HEART CATH AND CORS/GRAFTS ANGIOGRAPHY;  Surgeon: Kathleene Hazel, MD;  Location: [facility];  Service: Cardiovascular;  Laterality: N/A;   TRIGGER FINGER RELEASE     b/l fingers all    ULNAR NERVE TRANSPOSITION Left 06/06/2021   Procedure: ULNAR NERVE DECOMPRESSION;  Surgeon: Cindee Salt, MD;  Location: [facility];  Service: Orthopedics;  Laterality: Left;  AXILLARY BLOCK   VITRECTOMY     b/l    Social History   Occupational History   Not on file  Tobacco Use   Smoking status: Never    Passive exposure: Never   Smokeless tobacco: Never  Vaping Use   Vaping Use: Never used  Substance and Sexual Activity   Alcohol use: Yes    Comment: rarely 1 every 6 months   Drug use: No   Sexual activity: Not Currently    Partners: Male    Birth control/protection: None

## 2023-04-17 ENCOUNTER — Other Ambulatory Visit: Payer: Self-pay | Admitting: Cardiovascular Disease

## 2023-04-17 ENCOUNTER — Encounter: Payer: Commercial Managed Care - PPO | Admitting: Physical Therapy

## 2023-04-17 ENCOUNTER — Encounter: Payer: Self-pay | Admitting: Cardiovascular Disease

## 2023-04-17 ENCOUNTER — Other Ambulatory Visit: Payer: Self-pay

## 2023-04-17 MED ORDER — LOSARTAN POTASSIUM 100 MG PO TABS
100.0000 mg | ORAL_TABLET | Freq: Every day | ORAL | 3 refills | Status: DC
  Filled 2023-04-17: qty 90, 90d supply, fill #0
  Filled 2023-07-11: qty 90, 90d supply, fill #1
  Filled 2023-10-20: qty 90, 90d supply, fill #2
  Filled 2024-01-20: qty 90, 90d supply, fill #3

## 2023-04-18 ENCOUNTER — Other Ambulatory Visit: Payer: Self-pay | Admitting: *Deleted

## 2023-04-18 ENCOUNTER — Encounter: Payer: Self-pay | Admitting: Physical Medicine and Rehabilitation

## 2023-04-18 ENCOUNTER — Encounter: Payer: Self-pay | Admitting: Sports Medicine

## 2023-04-18 ENCOUNTER — Ambulatory Visit: Payer: Commercial Managed Care - PPO | Admitting: Sports Medicine

## 2023-04-18 DIAGNOSIS — R29898 Other symptoms and signs involving the musculoskeletal system: Secondary | ICD-10-CM

## 2023-04-18 DIAGNOSIS — L409 Psoriasis, unspecified: Secondary | ICD-10-CM | POA: Diagnosis not present

## 2023-04-18 DIAGNOSIS — Z79899 Other long term (current) drug therapy: Secondary | ICD-10-CM | POA: Diagnosis not present

## 2023-04-18 DIAGNOSIS — M25551 Pain in right hip: Secondary | ICD-10-CM

## 2023-04-18 DIAGNOSIS — M0609 Rheumatoid arthritis without rheumatoid factor, multiple sites: Secondary | ICD-10-CM

## 2023-04-18 DIAGNOSIS — M76899 Other specified enthesopathies of unspecified lower limb, excluding foot: Secondary | ICD-10-CM | POA: Diagnosis not present

## 2023-04-18 LAB — CBC WITH DIFFERENTIAL/PLATELET
Absolute Monocytes: 421 cells/uL (ref 200–950)
Basophils Absolute: 22 cells/uL (ref 0–200)
Basophils Relative: 0.4 %
Eosinophils Absolute: 119 cells/uL (ref 15–500)
Eosinophils Relative: 2.2 %
HCT: 33.7 % — ABNORMAL LOW (ref 35.0–45.0)
Hemoglobin: 11.4 g/dL — ABNORMAL LOW (ref 11.7–15.5)
Lymphs Abs: 1939 cells/uL (ref 850–3900)
MCH: 29.8 pg (ref 27.0–33.0)
MCHC: 33.8 g/dL (ref 32.0–36.0)
MCV: 88.2 fL (ref 80.0–100.0)
MPV: 9.2 fL (ref 7.5–12.5)
Monocytes Relative: 7.8 %
Neutro Abs: 2900 cells/uL (ref 1500–7800)
Neutrophils Relative %: 53.7 %
Platelets: 235 10*3/uL (ref 140–400)
RBC: 3.82 10*6/uL (ref 3.80–5.10)
RDW: 12.8 % (ref 11.0–15.0)
Total Lymphocyte: 35.9 %
WBC: 5.4 10*3/uL (ref 3.8–10.8)

## 2023-04-18 NOTE — Progress Notes (Signed)
[name] - 57 y.o. female MRN [id number]  Date of birth: [birth year]  Office Visit Note: Visit Date: 04/18/2023 PCP: Eden Emms, NP Referred by: Eden Emms, NP  Subjective: Chief Complaint  Patient presents with   Right Hip - Follow-up   HPI: [name] is a pleasant 57 y.o. female who presents today for follow-up of right posterior lateral hip pain.  She has had 3 trials of extracorporeal shockwave therapy, feels like she is noticing significant improvement.  She did previously have a flareup of her allergies and so did just complete a 6-day prednisone taper which she also believes to have had significantly improved her pain.  Has pain over the posterior insertional hamstrings with sitting and rides in the car.  Has pain over the lateral hip when walking or prolonged standing.  Pertinent ROS were reviewed with the patient and found to be negative unless otherwise specified above in HPI.   Assessment & Plan: Visit Diagnoses:  1. Hamstring tendinitis   2. Greater trochanteric pain syndrome of right lower extremity   3. Weakness of right hip    Plan: Discussed with [name] that I am glad she is finding benefit from ESWT for both the proximal hamstring tendinopathy as well as her greater trochanteric pain syndrome.  We believe her previous prednisone prescription help this as well.  We did repeat shockwave treatment today.  I would like to start spacing out these treatments, she may return in 2 weeks for reevaluation and repeat treatment.  Now that her acute pain from her fall has settled down, I would like her to get back into consistent home rehab exercises for the lateral hip and her posterior insertional hamstring pain.  Alternative treatment considerations if not improving: Ultrasound-guided ischial tuberosity injection or GT-injection; nitroglycerin patch protocol.  Follow-up: Return in about 2 weeks (around 05/02/2023) for for R-posterior-lat hip pain.   Meds &  Orders: No orders of the defined types were placed in this encounter.  No orders of the defined types were placed in this encounter.    Procedures:  Procedure: ECSWT Indications:  Ischial Bursitis/Proximal hamstring tendinosis   Procedure Details Consent: Risks of procedure as well as the alternatives and risks of each were explained to the patient.  Verbal consent for procedure obtained. Time Out: Verified patient identification, verified procedure, site was marked, verified correct patient position. The area was cleaned with alcohol swab.     The right ischial tuberosity, hamstring origin, and gluteus med/min was targeted for Extracorporeal shockwave therapy.    Preset: Trochanteric/Ischial Bursitis Power Level: 120 mJ Frequency: 12 Hz Impulse/cycles: 3400 Head size: Regular   Patient tolerated procedure well without immediate complications.      Clinical History: No specialty comments available.  She reports that she has never smoked. She has never been exposed to tobacco smoke. She has never used smokeless tobacco.  Recent Labs    05/15/22 1557  HGBA1C 7.0*    Objective:   Vital Signs: There were no vitals taken for this visit.  Physical Exam  Gen: Well-appearing, in no acute distress; non-toxic CV: Well-perfused. Warm.  Resp: Breathing unlabored on room air; no wheezing. Psych: Fluid speech in conversation; appropriate affect; normal thought process Neuro: Sensation intact throughout. No gross coordination deficits.   Ortho Exam -  Right hip: Mild TTP over the right greater trochanter as well as the ischial tuberosity and proximal posterior hamstring tendon origin. No overlying soft tissue or swelling.  There is  improved strength with resisted hip abduction, but still slightly weaker than the contralateral side, specifically with fatigability.    Imaging: No results found.  Past Medical/Family/Surgical/Social History: Medications & Allergies reviewed per EMR,  new medications updated. Patient Active Problem List   Diagnosis Date Noted   Myofascial pain dysfunction syndrome 03/22/2023   Left foot pain 12/05/2022   Urinary urgency 10/23/2022   Pyelonephritis 10/23/2022   Premature atrial contractions 05/15/2022   PVC's (premature ventricular contractions) 05/15/2022   Low hemoglobin 01/16/2022   Right ear pain 01/09/2022   Sore throat 01/09/2022   OSA on CPAP 08/02/2021   Abnormal MRI, lumbar spine 03/20/2021   Abnormal MRI, thoracic spine 03/20/2021   Chronic mid back pain 03/17/2021   Chronic midline low back pain with left-sided sciatica 03/17/2021   Type 1 diabetes mellitus with diabetic polyneuropathy 02/17/2021   Severe obstructive sleep apnea-hypopnea syndrome 02/17/2021   Chronic diastolic CHF (congestive heart failure) 12/19/2020   Chronic back pain 12/19/2020   Chronic abdominal pain 12/19/2020   Acute diastolic heart failure 12/09/2020   Diabetic retinopathy associated with type 1 diabetes mellitus 09/27/2020   Diabetic retinopathy 09/27/2020   Graves' disease 09/27/2020   Chronic coronary artery disease 09/27/2020   Hypertension associated with diabetes 09/14/2020   Obesity (BMI 30-39.9) 09/14/2020   Preventative health care 09/14/2020   Aortic atherosclerosis 09/14/2020   Snoring 05/12/2020   Lung nodule 03/10/2020   Colitis 03/10/2020   Type 1 diabetes mellitus with proliferative retinopathy 12/10/2019   Abnormal thyroid function test 12/10/2019   Bruxism (teeth grinding)    Chronic migraine 10/07/2019   Contusion of left hip 07/25/2019   H/O multiple allergies 03/10/2019   Hx of anaphylaxis 03/10/2019   Cough 06/26/2018   PND (post-nasal drip) 06/26/2018   Lumbar strain, initial encounter 04/10/2018   Thoracic myofascial strain 04/10/2018   Vitamin D deficiency 07/04/2017   B12 deficiency 07/04/2017   Abnormal CT scan 06/19/2017   Gastroesophageal reflux disease 06/19/2017   Antiplatelet or antithrombotic  long-term use 06/19/2017   Nasal congestion 03/15/2017   Graves disease 02/01/2017   Sleep difficulties 02/01/2017   No energy 02/01/2017   Osteopenia 02/01/2017   Bilateral hand pain 07/16/2016   Acquired trigger finger 07/16/2016   Hypertension, essential 05/08/2016   Foot pain, right 08/30/2014   Shoulder pain, right 08/30/2014   Chronic pain syndrome 01/15/2014   Multiple joint pain 01/15/2014   Lesion of lower eyelid 04/21/2013   Generalized constriction of visual field 03/31/2013   Neoplasm of uncertain behavior of skin of eyelid 03/31/2013   Posterior capsular opacification 03/31/2013   Cyst of left lower eyelid 03/30/2013   Pseudophakia of both eyes 03/30/2013   Prurigo nodularis 02/05/2013   Stasis dermatitis 02/05/2013   Psoriasis 01/21/2013   Trochanteric bursitis 11/10/2012   Achilles tendinitis 07/11/2012   Epiretinal membrane 06/17/2012   Status post cataract extraction 04/30/2012   Pes equinus, acquired 04/23/2012   Tendinitis of ankle 04/23/2012   Proliferative diabetic retinopathy of both eyes 01/11/2012   Ongoing use of possibly toxic medication 12/05/2011   Hyperthyroidism 11/09/2010   SINUS TACHYCARDIA 11/08/2010   DERMATITIS, ALLERGIC 07/20/2010   EDEMA 07/12/2010   Dizziness 11/14/2009   ATRIAL FIBRILLATION 07/20/2009   Hx of CABG 06/07/2009   ANGINA, STABLE/EXERTIONAL 06/02/2009   Dyslipidemia, goal LDL below 70 04/26/2009   ACUT MI ANTEROLAT WALL SUBSQT EPIS CARE 04/26/2009   Chest pain 04/26/2009   DIABETIC  RETINOPATHY 04/25/2009   CARPAL TUNNEL SYNDROME, BILATERAL  04/25/2009   TRIGGER FINGER 04/25/2009   MIGRAINE W/O AURA W/INTRACT W/STATUS MIGRAINOSUS 02/19/2008   ALLERGIC RHINITIS, SEASONAL 10/16/2006   CHOLELITHIASIS 10/16/2006   Rheumatoid arthritis 10/16/2006   Past Medical History:  Diagnosis Date   ACUT MI ANTEROLAT WALL SUBSQT EPIS CARE    Acute maxillary sinusitis    ALLERGIC RHINITIS, SEASONAL    Anemia    ARTHRITIS,  RHEUMATOID    shoulders and hands Enbrel>leg swelling Dr. Corliss Skains   Atrial fibrillation    a. after CABG.   Back pain    Bruxism (teeth grinding)    CAD, ARTERY BYPASS GRAFT    a. DES to RCA in 2010 then LAD occlusion s/p CABG 3 06/07/2009 with LIMA to LAD, reverse SVG to D1, reverse SVG to distal RCA. b. Cath 05/08/2016 slightly hypodense region in the intermediate branch, however she had excellent flow, FFR was normal. Vein graft to PDA and the posterolateral branch is patent, patent LIMA to LAD, occluded SVG to diagonal.   CARPAL TUNNEL SYNDROME, BILATERAL    CHOLELITHIASIS    Contrast media allergy    DERMATITIS, ALLERGIC    DIABETES MELLITUS, TYPE I    on insulin pump dx'ed age 35 y.o    DIABETIC  RETINOPATHY    Hiatal hernia    HYPERLIPIDEMIA-MIXED    HYPERTHYROIDISM    Dr. Osborne Casco   IDA (iron deficiency anemia)    Insulin pump in place    Lymphadenopathy of head and neck    MIGRAINE W/O AURA W/INTRACT W/STATUS MIGRAINOSUS 02/19/2008   PONV (postoperative nausea and vomiting)    Psoriasis    SINUS TACHYCARDIA 11/08/2010   Sleep apnea    not using machine yet    SVT (supraventricular tachycardia)    after s/p CABG   Tendonitis    TRIGGER FINGER    all fingers b/l hands    URI    Family History  Problem Relation Age of Onset   Depression Mother    Anxiety disorder Mother    Colon polyps Father    Diabetes Father        2   Hypertension Father    Parkinson's disease Father    Stroke Paternal Grandmother    Heart disease Other    Hypertension Other    Hyperlipidemia Other    Depression Other    Migraines Other    Heart attack Neg Hx    Colon cancer Neg Hx    Stomach cancer Neg Hx    Esophageal cancer Neg Hx    Past Surgical History:  Procedure Laterality Date   ABDOMINAL HYSTERECTOMY     endometriomas b/l; total ? cervix removed; no h/o abnormal pap   Caesarean section     CARDIAC CATHETERIZATION N/A 05/08/2016   Procedure: Left Heart Cath and  Cors/Grafts Angiography;  Surgeon: Kathleene Hazel, MD;  Location: [facility];  Service: Cardiovascular;  Laterality: N/A;   CARPAL TUNNEL RELEASE     b/l    CARPAL TUNNEL RELEASE Left 06/06/2021   Procedure: CARPAL TUNNEL RELEASE;  Surgeon: Cindee Salt, MD;  Location: [facility];  Service: Orthopedics;  Laterality: Left;  AXILLARY BLOCK   CATARACT EXTRACTION, BILATERAL     CHOLECYSTECTOMY     COLONOSCOPY WITH PROPOFOL N/A 03/25/2020   Procedure: COLONOSCOPY WITH PROPOFOL;  Surgeon: Wyline Mood, MD;  Location: [facility] ENDOSCOPY;  Service: Gastroenterology;  Laterality: N/A;   CORONARY ARTERY BYPASS GRAFT     ESOPHAGOGASTRODUODENOSCOPY (EGD) WITH PROPOFOL  N/A 03/25/2020   Procedure: ESOPHAGOGASTRODUODENOSCOPY (EGD) WITH PROPOFOL;  Surgeon: Wyline Mood, MD;  Location: [facility] ENDOSCOPY;  Service: Gastroenterology;  Laterality: N/A;   EYE SURGERY     laser x 2 for retinopathy    LEFT HEART CATH AND CORS/GRAFTS ANGIOGRAPHY N/A 02/22/2020   Procedure: LEFT HEART CATH AND CORS/GRAFTS ANGIOGRAPHY;  Surgeon: Kathleene Hazel, MD;  Location: [facility];  Service: Cardiovascular;  Laterality: N/A;   TRIGGER FINGER RELEASE     b/l fingers all    ULNAR NERVE TRANSPOSITION Left 06/06/2021   Procedure: ULNAR NERVE DECOMPRESSION;  Surgeon: Cindee Salt, MD;  Location: [facility];  Service: Orthopedics;  Laterality: Left;  AXILLARY BLOCK   VITRECTOMY     b/l    Social History   Occupational History   Not on file  Tobacco Use   Smoking status: Never    Passive exposure: Never   Smokeless tobacco: Never  Vaping Use   Vaping Use: Never used  Substance and Sexual Activity   Alcohol use: Yes    Comment: rarely 1 every 6 months   Drug use: No   Sexual activity: Not Currently    Partners: Male    Birth control/protection: None

## 2023-04-18 NOTE — Progress Notes (Signed)
Doing better; Has noticed significant improvement

## 2023-04-19 NOTE — Progress Notes (Signed)
Hemoglobin is low.  White cell count is normal.  Patient should take multivitamin with iron.

## 2023-04-23 ENCOUNTER — Other Ambulatory Visit (HOSPITAL_COMMUNITY): Payer: Self-pay

## 2023-04-24 ENCOUNTER — Encounter: Payer: Commercial Managed Care - PPO | Admitting: Physical Therapy

## 2023-04-24 ENCOUNTER — Other Ambulatory Visit: Payer: Self-pay | Admitting: Rheumatology

## 2023-04-26 ENCOUNTER — Other Ambulatory Visit (HOSPITAL_COMMUNITY): Payer: Self-pay

## 2023-04-29 ENCOUNTER — Encounter: Payer: Commercial Managed Care - PPO | Admitting: Physical Therapy

## 2023-04-29 ENCOUNTER — Other Ambulatory Visit (HOSPITAL_COMMUNITY): Payer: Self-pay

## 2023-05-01 ENCOUNTER — Encounter: Payer: Commercial Managed Care - PPO | Admitting: Physical Therapy

## 2023-05-02 ENCOUNTER — Ambulatory Visit: Payer: Commercial Managed Care - PPO | Admitting: Sports Medicine

## 2023-05-02 ENCOUNTER — Encounter: Payer: Self-pay | Admitting: Sports Medicine

## 2023-05-02 DIAGNOSIS — M76899 Other specified enthesopathies of unspecified lower limb, excluding foot: Secondary | ICD-10-CM | POA: Diagnosis not present

## 2023-05-02 DIAGNOSIS — E10319 Type 1 diabetes mellitus with unspecified diabetic retinopathy without macular edema: Secondary | ICD-10-CM | POA: Diagnosis not present

## 2023-05-02 DIAGNOSIS — M25551 Pain in right hip: Secondary | ICD-10-CM

## 2023-05-02 NOTE — Progress Notes (Signed)
[name] - 57 y.o. female MRN [id number]  Date of birth: 1966-06-27  Office Visit Note: Visit Date: 05/02/2023 PCP: Eden Emms, NP Referred by: Eden Emms, NP  Subjective: Chief Complaint  Patient presents with   Right Hip - Follow-up   HPI: [name] is a pleasant 57 y.o. female who presents today for follow-up of right posterior lateral hip pain.  We have had 4 sessions of extracorporeal shockwave therapy, she does continue to notice good improvement.  She has gotten back into home rehab exercises for both the lateral hip and her insertional hamstring pain.  Did do a lot of walking last week which slightly flared up the lateral hip pain, although her posterior hamstring and buttock pain is certainly improved as well.  Pertinent ROS were reviewed with the patient and found to be negative unless otherwise specified above in HPI.   Assessment & Plan: Visit Diagnoses:  1. Hamstring tendinitis   2. Greater trochanteric pain syndrome of right lower extremity    Plan: [name] continues finding benefit from extracorporeal shockwave therapy as well as her home exercises.  I also appreciate improved strength of her lateral hip abductors which is becoming more symmetric which will continue to help reduce her pain as well.  We did repeat shockwave treatment today, tolerated well.  We will continue spacing these out, may return in 2 weeks for reevaluation and repeat treatment.  Continue her home rehab exercises for the lateral hip and her posterior insertional hamstring pain.  Okay for Tylenol as needed.  Follow-up: Return in about 2 weeks (around 05/16/2023) for posterolateral hip.   Meds & Orders: No orders of the defined types were placed in this encounter.  No orders of the defined types were placed in this encounter.    Procedures: Procedure: ECSWT Indications:  Ischial Bursitis/Proximal hamstring tendinosis   Procedure Details Consent: Risks of procedure as well as the  alternatives and risks of each were explained to the patient.  Verbal consent for procedure obtained. Time Out: Verified patient identification, verified procedure, site was marked, verified correct patient position. The area was cleaned with alcohol swab.     The right ischial tuberosity, hamstring origin, and gluteus med/min was targeted for Extracorporeal shockwave therapy.    Preset: Trochanteric/Ischial Bursitis Power Level: 120 mJ Frequency: 14 Hz Impulse/cycles: 3600 (1800 and 1800) Head size: Regular   Patient tolerated procedure well without immediate complications.       Clinical History: No specialty comments available.  She reports that she has never smoked. She has never been exposed to tobacco smoke. She has never used smokeless tobacco.  Recent Labs    05/15/22 1557  HGBA1C 7.0*    Objective:   Vital Signs: There were no vitals taken for this visit.  Physical Exam  Gen: Well-appearing, in no acute distress; non-toxic CV: Well-perfused. Warm.  Resp: Breathing unlabored on room air; no wheezing. Psych: Fluid speech in conversation; appropriate affect; normal thought process Neuro: Sensation intact throughout. No gross coordination deficits.   Ortho Exam - Right hip: Mild TTP over the greater trochanter, improved strength compared to prior exams although still slightly weaker with resisted hip abduction on the right compared to the left.  Imaging: No results found.  Past Medical/Family/Surgical/Social History: Medications & Allergies reviewed per EMR, new medications updated. Patient Active Problem List   Diagnosis Date Noted   Myofascial pain dysfunction syndrome 03/22/2023   Left foot pain 12/05/2022   Urinary urgency 10/23/2022  Pyelonephritis 10/23/2022   Premature atrial contractions 05/15/2022   PVC's (premature ventricular contractions) 05/15/2022   Low hemoglobin 01/16/2022   Right ear pain 01/09/2022   Sore throat 01/09/2022   OSA on CPAP  08/02/2021   Abnormal MRI, lumbar spine 03/20/2021   Abnormal MRI, thoracic spine 03/20/2021   Chronic mid back pain 03/17/2021   Chronic midline low back pain with left-sided sciatica 03/17/2021   Type 1 diabetes mellitus with diabetic polyneuropathy (HCC) 02/17/2021   Severe obstructive sleep apnea-hypopnea syndrome 02/17/2021   Chronic diastolic CHF (congestive heart failure) (HCC) 12/19/2020   Chronic back pain 12/19/2020   Chronic abdominal pain 12/19/2020   Acute diastolic heart failure (HCC) 12/09/2020   Diabetic retinopathy associated with type 1 diabetes mellitus (HCC) 09/27/2020   Diabetic retinopathy (HCC) 09/27/2020   Graves' disease 09/27/2020   Chronic coronary artery disease 09/27/2020   Hypertension associated with diabetes (HCC) 09/14/2020   Obesity (BMI 30-39.9) 09/14/2020   Preventative health care 09/14/2020   Aortic atherosclerosis (HCC) 09/14/2020   Snoring 05/12/2020   Lung nodule 03/10/2020   Colitis 03/10/2020   Type 1 diabetes mellitus with proliferative retinopathy (HCC) 12/10/2019   Abnormal thyroid function test 12/10/2019   Bruxism (teeth grinding)    Chronic migraine 10/07/2019   Contusion of left hip 07/25/2019   H/O multiple allergies 03/10/2019   Hx of anaphylaxis 03/10/2019   Cough 06/26/2018   PND (post-nasal drip) 06/26/2018   Lumbar strain, initial encounter 04/10/2018   Thoracic myofascial strain 04/10/2018   Vitamin D deficiency 07/04/2017   B12 deficiency 07/04/2017   Abnormal CT scan 06/19/2017   Gastroesophageal reflux disease 06/19/2017   Antiplatelet or antithrombotic long-term use 06/19/2017   Nasal congestion 03/15/2017   Graves disease 02/01/2017   Sleep difficulties 02/01/2017   No energy 02/01/2017   Osteopenia 02/01/2017   Bilateral hand pain 07/16/2016   Acquired trigger finger 07/16/2016   Hypertension, essential 05/08/2016   Foot pain, right 08/30/2014   Shoulder pain, right 08/30/2014   Chronic pain syndrome  01/15/2014   Multiple joint pain 01/15/2014   Lesion of lower eyelid 04/21/2013   Generalized constriction of visual field 03/31/2013   Neoplasm of uncertain behavior of skin of eyelid 03/31/2013   Posterior capsular opacification 03/31/2013   Cyst of left lower eyelid 03/30/2013   Pseudophakia of both eyes 03/30/2013   Prurigo nodularis 02/05/2013   Stasis dermatitis 02/05/2013   Psoriasis 01/21/2013   Trochanteric bursitis 11/10/2012   Achilles tendinitis 07/11/2012   Epiretinal membrane 06/17/2012   Status post cataract extraction 04/30/2012   Pes equinus, acquired 04/23/2012   Tendinitis of ankle 04/23/2012   Proliferative diabetic retinopathy of both eyes (HCC) 01/11/2012   Ongoing use of possibly toxic medication 12/05/2011   Hyperthyroidism 11/09/2010   SINUS TACHYCARDIA 11/08/2010   DERMATITIS, ALLERGIC 07/20/2010   EDEMA 07/12/2010   Dizziness 11/14/2009   ATRIAL FIBRILLATION 07/20/2009   Hx of CABG 06/07/2009   ANGINA, STABLE/EXERTIONAL 06/02/2009   Dyslipidemia, goal LDL below 70 04/26/2009   ACUT MI ANTEROLAT WALL SUBSQT EPIS CARE 04/26/2009   Chest pain 04/26/2009   DIABETIC  RETINOPATHY 04/25/2009   CARPAL TUNNEL SYNDROME, BILATERAL 04/25/2009   TRIGGER FINGER 04/25/2009   MIGRAINE W/O AURA W/INTRACT W/STATUS MIGRAINOSUS 02/19/2008   ALLERGIC RHINITIS, SEASONAL 10/16/2006   CHOLELITHIASIS 10/16/2006   Rheumatoid arthritis (HCC) 10/16/2006   Past Medical History:  Diagnosis Date   ACUT MI ANTEROLAT WALL SUBSQT EPIS CARE    Acute maxillary sinusitis  ALLERGIC RHINITIS, SEASONAL    Anemia    ARTHRITIS, RHEUMATOID    shoulders and hands Enbrel>leg swelling Dr. Corliss Skains   Atrial fibrillation [facility])    a. after CABG.   Back pain    Bruxism (teeth grinding)    CAD, ARTERY BYPASS GRAFT    a. DES to RCA in 2010 then LAD occlusion s/p CABG 3 06/07/2009 with LIMA to LAD, reverse SVG to D1, reverse SVG to distal RCA. b. Cath 05/08/2016 slightly hypodense region in  the intermediate branch, however she had excellent flow, FFR was normal. Vein graft to PDA and the posterolateral branch is patent, patent LIMA to LAD, occluded SVG to diagonal.   CARPAL TUNNEL SYNDROME, BILATERAL    CHOLELITHIASIS    Contrast media allergy    DERMATITIS, ALLERGIC    DIABETES MELLITUS, TYPE I    on insulin pump dx'ed age 58 y.o    DIABETIC  RETINOPATHY    Hiatal hernia    HYPERLIPIDEMIA-MIXED    HYPERTHYROIDISM    Dr. Osborne Casco   IDA (iron deficiency anemia)    Insulin pump in place    Lymphadenopathy of head and neck    MIGRAINE W/O AURA W/INTRACT W/STATUS MIGRAINOSUS 02/19/2008   PONV (postoperative nausea and vomiting)    Psoriasis    SINUS TACHYCARDIA 11/08/2010   Sleep apnea    not using machine yet    SVT (supraventricular tachycardia)    after s/p CABG   Tendonitis    TRIGGER FINGER    all fingers b/l hands    URI    Family History  Problem Relation Age of Onset   Depression Mother    Anxiety disorder Mother    Colon polyps Father    Diabetes Father        2   Hypertension Father    Parkinson's disease Father    Stroke Paternal Grandmother    Heart disease Other    Hypertension Other    Hyperlipidemia Other    Depression Other    Migraines Other    Heart attack Neg Hx    Colon cancer Neg Hx    Stomach cancer Neg Hx    Esophageal cancer Neg Hx    Past Surgical History:  Procedure Laterality Date   ABDOMINAL HYSTERECTOMY     endometriomas b/l; total ? cervix removed; no h/o abnormal pap   Caesarean section     CARDIAC CATHETERIZATION N/A 05/08/2016   Procedure: Left Heart Cath and Cors/Grafts Angiography;  Surgeon: Kathleene Hazel, MD;  [facility]: [facility];  Service: Cardiovascular;  Laterality: N/A;   CARPAL TUNNEL RELEASE     b/l    CARPAL TUNNEL RELEASE Left 06/06/2021   Procedure: CARPAL TUNNEL RELEASE;  Surgeon: Cindee Salt, MD;  Location: [facility];  Service: Orthopedics;  Laterality: Left;  AXILLARY  BLOCK   CATARACT EXTRACTION, BILATERAL     CHOLECYSTECTOMY     COLONOSCOPY WITH PROPOFOL N/A 03/25/2020   Procedure: COLONOSCOPY WITH PROPOFOL;  Surgeon: Wyline Mood, MD;  Location: [facility] END[facility]COPY;  Service: Gastroenterology;  Laterality: N/A;   CORONARY ARTERY BYPASS GRAFT     ESOPHAGOGASTRODUODENOSCOPY (EGD) WITH PROPOFOL N/A 03/25/2020   Procedure: ESOPHAGOGASTRODUODENOSCOPY (EGD) WITH PROPOFOL;  Surgeon: Wyline Mood, MD;  Location: [facility] ENDOSCOPY;  Service: Gastroenterology;  Laterality: N/A;   EYE SURGERY     laser x 2 for retinopathy    LEFT HEART CATH AND CORS/GRAFTS ANGIOGRAPHY N/A 02/22/2020   Procedure: LEFT HEART CATH AND  CORS/GRAFTS ANGIOGRAPHY;  Surgeon: Kathleene Hazel, MD;  Location: [facility];  Service: Cardiovascular;  Laterality: N/A;   TRIGGER FINGER RELEASE     b/l fingers all    ULNAR NERVE TRANSPOSITION Left 06/06/2021   Procedure: ULNAR NERVE DECOMPRESSION;  Surgeon: Cindee Salt, MD;  Location: [facility];  Service: Orthopedics;  Laterality: Left;  AXILLARY BLOCK   VITRECTOMY     b/l    Social History   Occupational History   Not on file  Tobacco Use   Smoking status: Never    Passive exposure: Never   Smokeless tobacco: Never  Vaping Use   Vaping Use: Never used  Substance and Sexual Activity   Alcohol use: Yes    Comment: rarely 1 every 6 months   Drug use: No   Sexual activity: Not Currently    Partners: Male    Birth control/protection: None

## 2023-05-02 NOTE — Progress Notes (Signed)
Doing good; does feel like the shockwave is helping. States pain started to come back within last few days

## 2023-05-03 ENCOUNTER — Other Ambulatory Visit (HOSPITAL_COMMUNITY): Payer: Self-pay

## 2023-05-12 ENCOUNTER — Other Ambulatory Visit: Payer: Self-pay | Admitting: Rheumatology

## 2023-05-13 ENCOUNTER — Other Ambulatory Visit: Payer: Self-pay

## 2023-05-13 MED ORDER — CYCLOBENZAPRINE HCL 10 MG PO TABS
10.0000 mg | ORAL_TABLET | Freq: Every evening | ORAL | 0 refills | Status: DC | PRN
  Filled 2023-05-13: qty 30, 30d supply, fill #0

## 2023-05-13 NOTE — Telephone Encounter (Signed)
Last Fill: 04/09/2023  Next Visit: 06/13/2023  Last Visit: 03/14/2023  Dx: DDD (degenerative disc disease), lumbar   Current Dose per office note on on 03/14/2023:  Flexeril 10 mg 1 tablet at bedtime as needed   Okay to refill flexeril?

## 2023-05-14 ENCOUNTER — Other Ambulatory Visit: Payer: Self-pay

## 2023-05-14 ENCOUNTER — Telehealth: Payer: Self-pay | Admitting: *Deleted

## 2023-05-14 MED ORDER — POTASSIUM CHLORIDE ER 10 MEQ PO TBCR
10.0000 meq | EXTENDED_RELEASE_TABLET | Freq: Every day | ORAL | 3 refills | Status: DC
  Filled 2023-05-14: qty 90, 90d supply, fill #0
  Filled 2023-08-15: qty 90, 90d supply, fill #1

## 2023-05-14 NOTE — Telephone Encounter (Signed)
Potassium chloride 10 mEq sent to [facility] pharmacy.

## 2023-05-14 NOTE — Telephone Encounter (Signed)
Pt calling in today stated when pt seen Eligha Bridegroom, NP all pt's medications got canceled.  I do not see this.  Pt needs K-dur 10 meq daily sent to [facility] pharmacy for # 90. Do not see this on pt's medication list. Will send to [facility] to [place]. Please send in if correct.

## 2023-05-15 ENCOUNTER — Other Ambulatory Visit: Payer: Self-pay

## 2023-05-16 ENCOUNTER — Ambulatory Visit: Payer: Commercial Managed Care - PPO | Admitting: Sports Medicine

## 2023-05-21 ENCOUNTER — Encounter: Payer: Self-pay | Admitting: Sports Medicine

## 2023-05-21 ENCOUNTER — Ambulatory Visit: Payer: Commercial Managed Care - PPO | Admitting: Sports Medicine

## 2023-05-21 DIAGNOSIS — M76899 Other specified enthesopathies of unspecified lower limb, excluding foot: Secondary | ICD-10-CM | POA: Diagnosis not present

## 2023-05-21 DIAGNOSIS — M25551 Pain in right hip: Secondary | ICD-10-CM | POA: Diagnosis not present

## 2023-05-21 NOTE — Progress Notes (Signed)
[name] - 57 y.o. female MRN [id number]  Date of birth: [birth year]  Office Visit Note: Visit Date: 05/21/2023 PCP: Eden Emms, NP Referred by: Eden Emms, NP  Subjective: Chief Complaint  Patient presents with   Right Hip - Pain   HPI: [name] is a pleasant 57 y.o. female who presents today for follow-up of right posterior lateral hip pain.  In total, she has had about 5 sessions of extracorporeal shockwave therapy, continues to notice improvement.  Had been back to almost pain-free, but has been doing a lot of walking, mowing recently.  Has pain on the lateral side with inclines.  Pain in the buttock only with sitting.  No new injury.  Very infrequent ibuprofen use.  Pertinent ROS were reviewed with the patient and found to be negative unless otherwise specified above in HPI.   Assessment & Plan: Visit Diagnoses:  1. Hamstring tendinitis   2. Greater trochanteric pain syndrome of right lower extremity    Plan: [name] has been finding the relief of her pain from shockwave and her home exercises, did do a lot more walking and physical activity which slightly exacerbated her pain.  Will proceed with 1-2 additional shockwave treatments but PT is about 1 week apart to help with cumulative effect.  She will perform her hip stabilization and lateral abduction series 3 times weekly.  Okay for Tylenol as needed.  Will follow-up in 1-week for repeat treatment and eval.  Follow-up: Return in about 1 week (around 05/28/2023) for f/u for hip (reg visit).   Meds & Orders: No orders of the defined types were placed in this encounter.  No orders of the defined types were placed in this encounter.    Procedures: Procedure: ECSWT Indications:  Ischial Bursitis/Proximal hamstring tendinosis   Procedure Details Consent: Risks of procedure as well as the alternatives and risks of each were explained to the patient.  Verbal consent for procedure obtained. Time Out: Verified  patient identification, verified procedure, site was marked, verified correct patient position. The area was cleaned with alcohol swab.     The right ischial tuberosity, hamstring origin, and gluteus med/min was targeted for Extracorporeal shockwave therapy.    Preset: Trochanteric/Ischial Bursitis Power Level: 120 mJ Frequency: 14 Hz Impulse/cycles: 3800 (1900 and 1900) Head size: Regular   Patient tolerated procedure well without immediate complications.        Clinical History: No specialty comments available.  She reports that she has never smoked. She has never been exposed to tobacco smoke. She has never used smokeless tobacco. No results for input(s): "HGBA1C", "LABURIC" in the last 8760 hours.  Objective:   Vital Signs: There were no vitals taken for this visit.  Physical Exam  Gen: Well-appearing, in no acute distress; non-toxic [id number]. Warm.  Resp: Breathing unlabored on room air; no wheezing. Psych: Fluid speech in conversation; appropriate affect; normal thought process Neuro: Sensation intact throughout. No gross coordination deficits.   Ortho Exam -There is a mild TTP over the greater trochanter in over the ischial tuberosity.  Full range of motion about the hip.  Very mild weakness with resisted hip abduction compared to the left, although no Trendelenburg gait noted.  Imaging: No results found.  Past Medical/Family/Surgical/Social History: Medications & Allergies reviewed per EMR, new medications updated. Patient Active Problem List   Diagnosis Date Noted   Myofascial pain dysfunction syndrome 03/22/2023   Left foot pain 12/05/2022   Urinary urgency 10/23/2022   Pyelonephritis 10/23/2022  Premature atrial contractions 05/15/2022   PVC's (premature ventricular contractions) 05/15/2022   Low hemoglobin 01/16/2022   Right ear pain 01/09/2022   Sore throat 01/09/2022   OSA on CPAP 08/02/2021   Abnormal MRI, lumbar spine 03/20/2021   Abnormal MRI,  thoracic spine 03/20/2021   Chronic mid back pain 03/17/2021   Chronic midline low back pain with left-sided sciatica 03/17/2021   Type 1 diabetes mellitus with diabetic polyneuropathy (HCC) 02/17/2021   Severe obstructive sleep apnea-hypopnea syndrome 02/17/2021   Chronic diastolic CHF (congestive heart failure) (HCC) 12/19/2020   Chronic back pain 12/19/2020   Chronic abdominal pain 12/19/2020   Acute diastolic heart failure (HCC) 12/09/2020   Diabetic retinopathy associated with type 1 diabetes mellitus (HCC) 09/27/2020   Diabetic retinopathy (HCC) 09/27/2020   Graves' disease 09/27/2020   Chronic coronary artery disease 09/27/2020   Hypertension associated with diabetes (HCC) 09/14/2020   Obesity (BMI 30-39.9) 09/14/2020   Preventative health care 09/14/2020   Aortic atherosclerosis (HCC) 09/14/2020   Snoring 05/12/2020   Lung nodule 03/10/2020   Colitis 03/10/2020   Type 1 diabetes mellitus with proliferative retinopathy (HCC) 12/10/2019   Abnormal thyroid function test 12/10/2019   Bruxism (teeth grinding)    Chronic migraine 10/07/2019   Contusion of left hip 07/25/2019   H/O multiple allergies 03/10/2019   Hx of anaphylaxis 03/10/2019   Cough 06/26/2018   PND (post-nasal drip) 06/26/2018   Lumbar strain, initial encounter 04/10/2018   Thoracic myofascial strain 04/10/2018   Vitamin D deficiency 07/04/2017   B12 deficiency 07/04/2017   Abnormal CT scan 06/19/2017   Gastroesophageal reflux disease 06/19/2017   Antiplatelet or antithrombotic long-term use 06/19/2017   Nasal congestion 03/15/2017   Graves disease 02/01/2017   Sleep difficulties 02/01/2017   No energy 02/01/2017   Osteopenia 02/01/2017   Bilateral hand pain 07/16/2016   Acquired trigger finger 07/16/2016   Hypertension, essential 05/08/2016   Foot pain, right 08/30/2014   Shoulder pain, right 08/30/2014   Chronic pain syndrome 01/15/2014   Multiple joint pain 01/15/2014   Lesion of lower eyelid  04/21/2013   Generalized constriction of visual field 03/31/2013   Neoplasm of uncertain behavior of skin of eyelid 03/31/2013   Posterior capsular opacification 03/31/2013   Cyst of left lower eyelid 03/30/2013   Pseudophakia of both eyes 03/30/2013   Prurigo nodularis 02/05/2013   Stasis dermatitis 02/05/2013   Psoriasis 01/21/2013   Trochanteric bursitis 11/10/2012   Achilles tendinitis 07/11/2012   Epiretinal membrane 06/17/2012   Status post cataract extraction 04/30/2012   Pes equinus, acquired 04/23/2012   Tendinitis of ankle 04/23/2012   Proliferative diabetic retinopathy of both eyes (HCC) 01/11/2012   Ongoing use of possibly toxic medication 12/05/2011   Hyperthyroidism 11/09/2010   SINUS TACHYCARDIA 11/08/2010   DERMATITIS, ALLERGIC 07/20/2010   EDEMA 07/12/2010   Dizziness 11/14/2009   ATRIAL FIBRILLATION 07/20/2009   Hx of CABG 06/07/2009   ANGINA, STABLE/EXERTIONAL 06/02/2009   Dyslipidemia, goal LDL below 70 04/26/2009   ACUT MI ANTEROLAT WALL SUBSQT EPIS CARE 04/26/2009   Chest pain 04/26/2009   DIABETIC  RETINOPATHY 04/25/2009   CARPAL TUNNEL SYNDROME, BILATERAL 04/25/2009   TRIGGER FINGER 04/25/2009   MIGRAINE W/O AURA W/INTRACT W/STATUS MIGRAINOSUS 02/19/2008   ALLERGIC RHINITIS, SEASONAL 10/16/2006   CHOLELITHIASIS 10/16/2006   Rheumatoid arthritis (HCC) 10/16/2006   Past Medical History:  Diagnosis Date   ACUT MI ANTEROLAT WALL SUBSQT EPIS CARE    Acute maxillary sinusitis    ALLERGIC RHINITIS, SEASONAL  Anemia    ARTHRITIS, RHEUMATOID    shoulders and hands Enbrel>leg swelling Dr. Corliss Skains   Atrial fibrillation [facility]    a. after CABG.   Back pain    Bruxism (teeth grinding)    CAD, ARTERY BYPASS GRAFT    a. DES to RCA in 2010 then LAD occlusion s/p CABG 3 06/07/2009 with LIMA to LAD, reverse SVG to D1, reverse SVG to distal RCA. b. Cath 05/08/2016 slightly hypodense region in the intermediate branch, however she had excellent flow, FFR was  normal. Vein graft to PDA and the posterolateral branch is patent, patent LIMA to LAD, occluded SVG to diagonal.   CARPAL TUNNEL SYNDROME, BILATERAL    CHOLELITHIASIS    Contrast media allergy    DERMATITIS, ALLERGIC    DIABETES MELLITUS, TYPE I    on insulin pump dx'ed age 36 y.o    DIABETIC  RETINOPATHY    Hiatal hernia    HYPERLIPIDEMIA-MIXED    HYPERTHYROIDISM    Dr. Osborne Casco   IDA (iron deficiency anemia)    Insulin pump in place    Lymphadenopathy of head and neck    MIGRAINE W/O AURA W/INTRACT W/STATUS MIGRAINOSUS 02/19/2008   PONV (postoperative nausea and vomiting)    Psoriasis    SINUS TACHYCARDIA 11/08/2010   Sleep apnea    not using machine yet    SVT (supraventricular tachycardia)    after s/p CABG   Tendonitis    TRIGGER FINGER    all fingers b/l hands    URI    Family History  Problem Relation Age of Onset   Depression Mother    Anxiety disorder Mother    Colon polyps Father    Diabetes Father        2   Hypertension Father    Parkinson's disease Father    Stroke Paternal Grandmother    Heart disease Other    Hypertension Other    Hyperlipidemia Other    Depression Other    Migraines Other    Heart attack Neg Hx    Colon cancer Neg Hx    Stomach cancer Neg Hx    Esophageal cancer Neg Hx    Past Surgical History:  Procedure Laterality Date   ABDOMINAL HYSTERECTOMY     endometriomas b/l; total ? cervix removed; no h/o abnormal pap   Caesarean section     CARDIAC CATHETERIZATION N/A 05/08/2016   Procedure: Left Heart Cath and Cors/Grafts Angiography;  Surgeon: Kathleene Hazel, MD;  Location: [facility];  Service: Cardiovascular;  Laterality: N/A;   CARPAL TUNNEL RELEASE     b/l    CARPAL TUNNEL RELEASE Left 06/06/2021   Procedure: CARPAL TUNNEL RELEASE;  Surgeon: Cindee Salt, MD;  Location: [facility];  Service: Orthopedics;  Laterality: Left;  AXILLARY BLOCK   CATARACT EXTRACTION, BILATERAL     CHOLECYSTECTOMY      COLONOSCOPY WITH PROPOFOL N/A 03/25/2020   Procedure: COLONOSCOPY WITH PROPOFOL;  Surgeon: Wyline Mood, MD;  Location: [facility] END[facility]COPY;  Service: Gastroenterology;  Laterality: N/A;   CORONARY ARTERY BYPASS GRAFT     ESOPHAGOGASTRODUODENOSCOPY (EGD) WITH PROPOFOL N/A 03/25/2020   Procedure: ESOPHAGOGASTRODUODENOSCOPY (EGD) WITH PROPOFOL;  Surgeon: Wyline Mood, MD;  Location: [facility] ENDOSCOPY;  Service: Gastroenterology;  Laterality: N/A;   EYE SURGERY     laser x 2 for retinopathy    LEFT HEART CATH AND CORS/GRAFTS ANGIOGRAPHY N/A 02/22/2020   Procedure: LEFT HEART CATH AND CORS/GRAFTS ANGIOGRAPHY;  Surgeon: Verne Carrow  D, MD;  Location: [facility];  Service: Cardiovascular;  Laterality: N/A;   TRIGGER FINGER RELEASE     b/l fingers all    ULNAR NERVE TRANSPOSITION Left 06/06/2021   Procedure: ULNAR NERVE DECOMPRESSION;  Surgeon: Cindee Salt, MD;  Location: [facility];  Service: Orthopedics;  Laterality: Left;  AXILLARY BLOCK   VITRECTOMY     b/l    Social History   Occupational History   Not on file  Tobacco Use   Smoking status: Never    Passive exposure: Never   Smokeless tobacco: Never  Vaping Use   Vaping Use: Never used  Substance and Sexual Activity   Alcohol use: Yes    Comment: rarely 1 every 6 months   Drug use: No   Sexual activity: Not Currently    Partners: Male    Birth control/protection: None

## 2023-05-21 NOTE — Progress Notes (Signed)
Shockwave helping; Starting to hurt some since its been 2.5 weeks since last treatment

## 2023-05-28 ENCOUNTER — Other Ambulatory Visit: Payer: Self-pay | Admitting: Physician Assistant

## 2023-05-28 ENCOUNTER — Other Ambulatory Visit (HOSPITAL_COMMUNITY): Payer: Self-pay

## 2023-05-28 DIAGNOSIS — M0609 Rheumatoid arthritis without rheumatoid factor, multiple sites: Secondary | ICD-10-CM

## 2023-05-29 ENCOUNTER — Other Ambulatory Visit: Payer: Self-pay

## 2023-05-29 ENCOUNTER — Other Ambulatory Visit (HOSPITAL_COMMUNITY): Payer: Self-pay

## 2023-05-29 MED ORDER — RINVOQ 15 MG PO TB24
15.0000 mg | ORAL_TABLET | Freq: Every day | ORAL | 0 refills | Status: DC
Start: 2023-05-29 — End: 2023-09-04
  Filled 2023-05-29: qty 30, 30d supply, fill #0
  Filled 2023-06-28: qty 30, 30d supply, fill #1
  Filled 2023-07-26: qty 30, 30d supply, fill #2

## 2023-05-29 NOTE — Telephone Encounter (Signed)
Last Fill: 02/21/2023  Labs: 04/18/2023 CBC: Hemoglobin is low.  White cell count is normal.  Patient should take multivitamin with iron.  03/14/2023 TSH WNL. Glucose is 121. Rest of CMP WNL. WBC count is borderline low-3.6. rest of CBC Wnl.  Recommend rechecking CBC with diff in 1 month.  TB Gold: 10/13/2022  negative   Next Visit: 06/13/2023  Last Visit: 03/14/2023  [id number] arthritis of multiple sites with negative rheumatoid factor   Current Dose per office note on 03/14/2023: Rinvoq 15 mg 1 tablet by mouth once daily   Okay to refill Rinvoq?

## 2023-05-30 ENCOUNTER — Ambulatory Visit: Payer: Commercial Managed Care - PPO | Admitting: Sports Medicine

## 2023-05-30 ENCOUNTER — Encounter: Payer: Self-pay | Admitting: Sports Medicine

## 2023-05-30 DIAGNOSIS — R29898 Other symptoms and signs involving the musculoskeletal system: Secondary | ICD-10-CM | POA: Diagnosis not present

## 2023-05-30 DIAGNOSIS — G5701 Lesion of sciatic nerve, right lower limb: Secondary | ICD-10-CM

## 2023-05-30 DIAGNOSIS — M25551 Pain in right hip: Secondary | ICD-10-CM | POA: Diagnosis not present

## 2023-05-30 NOTE — Progress Notes (Signed)
Office Visit Note  Patient: [name]             Date of Birth: [birth year]           MRN: [id number]             PCP: Eden Emms, NP Referring: Eden Emms, NP Visit Date: 06/13/2023 Occupation: @GUAROCC @  Subjective:  Chronic pain and fatigue   History of Present Illness: [name] is a 57 y.o. female with history of seronegative rheumatoid arthritis.  Patient remains on Rinvoq 15 mg 1 tablet by mouth once daily and Arava 20 mg 1 tablet by mouth daily she is tolerating combination therapy without any side effects.  She has not missed any doses recently.  Patient states she continues to have chronic pain and stiffness involving multiple joints.  She is currently having stiffness in both hands.  She has been under the care of Dr. Shon Baton for management of bilateral hip pain which has gradually been improving.  She is also established care with Dr. Berline Chough for pain management.  She tried Cymbalta but discontinued due to intolerance-dizziness.  She will be following up with Dr. Berline Chough on 06/28/2023 to discuss trying naltrexone as well as for trigger point injections.  Patient states she continues to have profound fatigue on a daily basis.  Her energy level has not improved. She denies any recent or recurrent infections.  She denies any new medical conditions.   Activities of Daily Living:  Patient reports morning stiffness for 60-90 minutes.   Patient Reports nocturnal pain.  Difficulty dressing/grooming: Denies Difficulty climbing stairs: Reports Difficulty getting out of chair: Reports Difficulty using hands for taps, buttons, cutlery, and/or writing: Reports  Review of Systems  Constitutional:  Positive for fatigue.  HENT:  Negative for mouth sores and mouth dryness.   Eyes:  Positive for dryness.  Respiratory:  Positive for shortness of breath.   Cardiovascular:  Negative for chest pain and palpitations.  Gastrointestinal:  Negative for blood in stool, constipation and  diarrhea.  Endocrine: Negative for increased urination.  Genitourinary:  Negative for involuntary urination.  Musculoskeletal:  Positive for joint pain, gait problem, joint pain, joint swelling, myalgias, muscle weakness, morning stiffness, muscle tenderness and myalgias.  Skin:  Negative for color change, rash, hair loss and sensitivity to sunlight.  Allergic/Immunologic: Negative for susceptible to infections.  Neurological:  Positive for dizziness and headaches.  Hematological:  Negative for swollen glands.  Psychiatric/Behavioral:  Positive for sleep disturbance. Negative for depressed mood. The patient is not nervous/anxious.     PMFS History:  Patient Active Problem List   Diagnosis Date Noted   Lower extremity edema 06/12/2023   Myofascial pain dysfunction syndrome 03/22/2023   Left foot pain 12/05/2022   Urinary urgency 10/23/2022   Pyelonephritis 10/23/2022   Premature atrial contractions 05/15/2022   PVC's (premature ventricular contractions) 05/15/2022   Low hemoglobin 01/16/2022   Otalgia of right ear 01/09/2022   Sore throat 01/09/2022   OSA on CPAP 08/02/2021   Abnormal MRI, lumbar spine 03/20/2021   Abnormal MRI, thoracic spine 03/20/2021   Chronic mid back pain 03/17/2021   Chronic midline low back pain with left-sided sciatica 03/17/2021   Type 1 diabetes mellitus with diabetic polyneuropathy (HCC) 02/17/2021   Severe obstructive sleep apnea-hypopnea syndrome 02/17/2021   Chronic diastolic CHF (congestive heart failure) (HCC) 12/19/2020   Chronic back pain 12/19/2020   Chronic abdominal pain 12/19/2020   Acute diastolic heart failure (HCC) 12/09/2020  Diabetic retinopathy associated with type 1 diabetes mellitus (HCC) 09/27/2020   Diabetic retinopathy (HCC) 09/27/2020   Graves' disease 09/27/2020   Chronic coronary artery disease 09/27/2020   Hypertension associated with diabetes (HCC) 09/14/2020   Obesity (BMI 30-39.9) 09/14/2020   Preventative health care  09/14/2020   Aortic atherosclerosis (HCC) 09/14/2020   Snoring 05/12/2020   Lung nodule 03/10/2020   Colitis 03/10/2020   Type 1 diabetes mellitus with proliferative retinopathy (HCC) 12/10/2019   Abnormal thyroid function test 12/10/2019   Bruxism (teeth grinding)    Chronic migraine 10/07/2019   Contusion of left hip 07/25/2019   H/O multiple allergies 03/10/2019   Hx of anaphylaxis 03/10/2019   Cough 06/26/2018   PND (post-nasal drip) 06/26/2018   Lumbar strain, initial encounter 04/10/2018   Thoracic myofascial strain 04/10/2018   Vitamin D deficiency 07/04/2017   B12 deficiency 07/04/2017   Abnormal CT scan 06/19/2017   Gastroesophageal reflux disease 06/19/2017   Antiplatelet or antithrombotic long-term use 06/19/2017   Nasal congestion 03/15/2017   Graves disease 02/01/2017   Sleep difficulties 02/01/2017   No energy 02/01/2017   Osteopenia 02/01/2017   Bilateral hand pain 07/16/2016   Acquired trigger finger 07/16/2016   Hypertension, essential 05/08/2016   Foot pain, right 08/30/2014   Shoulder pain, right 08/30/2014   Chronic pain syndrome 01/15/2014   Multiple joint pain 01/15/2014   Lesion of lower eyelid 04/21/2013   Generalized constriction of visual field 03/31/2013   Neoplasm of uncertain behavior of skin of eyelid 03/31/2013   Posterior capsular opacification 03/31/2013   Cyst of left lower eyelid 03/30/2013   Pseudophakia of both eyes 03/30/2013   Prurigo nodularis 02/05/2013   Stasis dermatitis 02/05/2013   Psoriasis 01/21/2013   Trochanteric bursitis 11/10/2012   Achilles tendinitis 07/11/2012   Epiretinal membrane 06/17/2012   Status post cataract extraction 04/30/2012   Pes equinus, acquired 04/23/2012   Tendinitis of ankle 04/23/2012   Proliferative diabetic retinopathy of both eyes (HCC) 01/11/2012   Ongoing use of possibly toxic medication 12/05/2011   Hyperthyroidism 11/09/2010   SINUS TACHYCARDIA 11/08/2010   DERMATITIS, ALLERGIC  07/20/2010   EDEMA 07/12/2010   Dizziness 11/14/2009   ATRIAL FIBRILLATION 07/20/2009   Hx of CABG 06/07/2009   ANGINA, STABLE/EXERTIONAL 06/02/2009   Dyslipidemia, goal LDL below 70 04/26/2009   ACUT MI ANTEROLAT WALL SUBSQT EPIS CARE 04/26/2009   Chest pain 04/26/2009   DIABETIC  RETINOPATHY 04/25/2009   CARPAL TUNNEL SYNDROME, BILATERAL 04/25/2009   TRIGGER FINGER 04/25/2009   MIGRAINE W/O AURA W/INTRACT W/STATUS MIGRAINOSUS 02/19/2008   ALLERGIC RHINITIS, SEASONAL 10/16/2006   CHOLELITHIASIS 10/16/2006   Rheumatoid arthritis (HCC) 10/16/2006    Past Medical History:  Diagnosis Date   ACUT MI ANTEROLAT WALL SUBSQT EPIS CARE    Acute maxillary sinusitis    ALLERGIC RHINITIS, SEASONAL    Anemia    ARTHRITIS, RHEUMATOID    shoulders and hands Enbrel>leg swelling Dr. Corliss Skains   Atrial fibrillation [facility])    a. after CABG.   Back pain    Bruxism (teeth grinding)    CAD, ARTERY BYPASS GRAFT    a. DES to RCA in 2010 then LAD occlusion s/p CABG 3 06/07/2009 with LIMA to LAD, reverse SVG to D1, reverse SVG to distal RCA. b. Cath 05/08/2016 slightly hypodense region in the intermediate branch, however she had excellent flow, FFR was normal. Vein graft to PDA and the posterolateral branch is patent, patent LIMA to LAD, occluded SVG to diagonal.   CARPAL TUNNEL  SYNDROME, BILATERAL    CHOLELITHIASIS    Contrast media allergy    DERMATITIS, ALLERGIC    DIABETES MELLITUS, TYPE I    on insulin pump dx'ed age 4 y.o    DIABETIC  RETINOPATHY    Hiatal hernia    HYPERLIPIDEMIA-MIXED    HYPERTHYROIDISM    Dr. Osborne Casco   IDA (iron deficiency anemia)    Insulin pump in place    Lymphadenopathy of head and neck    MIGRAINE W/O AURA W/INTRACT W/STATUS MIGRAINOSUS 02/19/2008   PONV (postoperative nausea and vomiting)    Psoriasis    SINUS TACHYCARDIA 11/08/2010   Sleep apnea    not using machine yet    SVT (supraventricular tachycardia)    after s/p CABG   Tendonitis    TRIGGER  FINGER    all fingers b/l hands    URI     Family History  Problem Relation Age of Onset   Depression Mother    Anxiety disorder Mother    Colon polyps Father    Diabetes Father        2   Hypertension Father    Parkinson's disease Father    Stroke Paternal Grandmother    Heart disease Other    Hypertension Other    Hyperlipidemia Other    Depression Other    Migraines Other    Heart attack Neg Hx    Colon cancer Neg Hx    Stomach cancer Neg Hx    Esophageal cancer Neg Hx    Past Surgical History:  Procedure Laterality Date   ABDOMINAL HYSTERECTOMY     endometriomas b/l; total ? cervix removed; no h/o abnormal pap   Caesarean section     CARDIAC CATHETERIZATION N/A 05/08/2016   Procedure: Left Heart Cath and Cors/Grafts Angiography;  Surgeon: Kathleene Hazel, MD;  Location: [facility];  Service: Cardiovascular;  Laterality: N/A;   CARPAL TUNNEL RELEASE     b/l    CARPAL TUNNEL RELEASE Left 06/06/2021   Procedure: CARPAL TUNNEL RELEASE;  Surgeon: Cindee Salt, MD;  Location: [facility];  Service: Orthopedics;  Laterality: Left;  AXILLARY BLOCK   CATARACT EXTRACTION, BILATERAL     CHOLECYSTECTOMY     COLONOSCOPY WITH PROPOFOL N/A 03/25/2020   Procedure: COLONOSCOPY WITH PROPOFOL;  Surgeon: Wyline Mood, MD;  Location: [facility];  Service: Gastroenterology;  Laterality: N/A;   CORONARY ARTERY BYPASS GRAFT     ESOPHAGOGASTRODUODENOSCOPY (EGD) WITH PROPOFOL N/A 03/25/2020   Procedure: ESOPHAGOGASTRODUODENOSCOPY (EGD) WITH PROPOFOL;  Surgeon: Wyline Mood, MD;  Location: [facility];  Service: Gastroenterology;  Laterality: N/A;   EYE SURGERY     laser x 2 for retinopathy    LEFT HEART CATH AND CORS/GRAFTS ANGIOGRAPHY N/A 02/22/2020   Procedure: LEFT HEART CATH AND CORS/GRAFTS ANGIOGRAPHY;  Surgeon: Kathleene Hazel, MD;  Location: [facility];  Service: Cardiovascular;  Laterality: N/A;   TRIGGER FINGER RELEASE     b/l fingers all     ULNAR NERVE TRANSPOSITION Left 06/06/2021   Procedure: ULNAR NERVE DECOMPRESSION;  Surgeon: Cindee Salt, MD;  Location: [facility];  Service: Orthopedics;  Laterality: Left;  AXILLARY BLOCK   VITRECTOMY     b/l    Social History   Social History Narrative   Divorced. Has 2 kids(fraternal twins) daughters age 40. Works at  [employer], Never smoked, denies ETOH, no drugs. Drinks diet coke. No exercise.       DPR daughters  Melissa and Loraine Grip twins          Immunization History  Administered Date(s) Administered   Influenza Inj Mdck Quad With Preservative 09/30/2014   Influenza, Quadrivalent, Recombinant, Inj, Pf 10/14/2013   Influenza, Seasonal, Injecte, Preservative Fre 09/19/2016   Influenza,inj,Quad PF,6+ Mos 09/30/2014, 09/23/2017, 09/03/2018, 10/02/2022   Influenza,inj,quad, With Preservative 09/23/2017   Influenza-Unspecified 09/30/2014, 09/30/2016, 09/03/2018, 09/10/2019, 09/28/2020, 09/28/2021   PFIZER(Purple Top)SARS-COV-2 Vaccination 12/28/2019, 01/18/2020   Pneumococcal Conjugate-13 07/16/2016   Pneumococcal Polysaccharide-23 01/01/2004, 10/15/2006, 01/04/2020   Td 10/01/2003   Tdap 01/09/2011, 06/25/2018   Zoster Recombinat (Shingrix) 09/14/2020, 12/15/2020     Objective: Vital Signs: BP 108/68 (BP Location: Left Arm, Patient Position: Sitting, Cuff Size: Normal)   Pulse 90   Resp 12   Ht 5\' 3"  (1.6 m)   Wt 176 lb (79.8 kg)   BMI 31.18 kg/m    Physical Exam Vitals and nursing note reviewed.  Constitutional:      Appearance: She is well-developed.  HENT:     Head: Normocephalic and atraumatic.  Eyes:     Conjunctiva/sclera: Conjunctivae normal.  Cardiovascular:     Rate and Rhythm: Normal rate and regular rhythm.     Heart sounds: Normal heart sounds.  Pulmonary:     Effort: Pulmonary effort is normal.     Breath sounds: Normal breath sounds.  Abdominal:     General: Bowel sounds are normal.     Palpations: Abdomen is soft.   Musculoskeletal:     Cervical back: Normal range of motion.  Lymphadenopathy:     Cervical: No cervical adenopathy.  Skin:    General: Skin is warm and dry.     Capillary Refill: Capillary refill takes less than 2 seconds.  Neurological:     Mental Status: She is alert and oriented to person, place, and time.  Psychiatric:        Behavior: Behavior normal.      Musculoskeletal Exam: Generalized hyperalgesia and positive tender points.  C-spine limited ROM.  Painful ROM of both shoulders.  Elbow joints have good ROM.  Small nodule on extensor surface of the left elbow.  Wrist joints, MCPs, PIPs, and DIPs good ROM with no synovitis.  PIP and DIP thickening consistent with OA of both hands.  Painful ROM of both hips.  Knee joints have good ROM with no effusion.  Ankle joints have good ROM with no tenderness or swelling.   CDAI Exam: CDAI Score: 3.7  Patient Global: 5 / 100; Provider Global: 2 / 100 Swollen: 0 ; Tender: 5  Joint Exam 06/13/2023      Right  Left  Glenohumeral   Tender   Tender  MCP 1   Tender     Hip   Tender   Tender     Investigation: No additional findings.  Imaging: No results found.  Recent Labs: Lab Results  Component Value Date   WBC 4.2 06/12/2023   HGB 11.8 (L) 06/12/2023   PLT 231.0 06/12/2023   NA 134 (L) 06/12/2023   K 3.9 06/12/2023   CL 96 06/12/2023   CO2 29 06/12/2023   GLUCOSE 118 (H) 06/12/2023   BUN 15 06/12/2023   CREATININE 0.92 06/12/2023   BILITOT 0.5 06/12/2023   ALKPHOS 67 06/12/2023   AST 32 06/12/2023   ALT 24 06/12/2023   PROT 6.6 06/12/2023   ALBUMIN 4.4 06/12/2023   CALCIUM 8.9 06/12/2023   GFRAA 86 04/07/2021   QFTBGOLDPLUS NEGATIVE 10/31/2021  Speciality Comments: TB Gold: 10/10/2022 Neg  PLQ Eye Exam: 01/06/2020 ABNORMAL @ [facility]. Patient advised to discontinue PLQ at office visit on 01/06/2020.  Prior Therapy: methotrexate/Xeljanz/Actemra (GI upset), Enbrel (injection site  reaction) and Orencia/Remicade/Humira/Cimzia/Rituxan/Simponi/Simponi Aria (inadequate response).  Procedures:  No procedures performed Allergies: Actemra [tocilizumab], Ramipril, Shellfish-derived products, Atorvastatin, Compazine  [prochlorperazine edisylate], Emgality [galcanezumab-gnlm], Etanercept, Infliximab, Iohexol, Orencia [abatacept], Prochlorperazine edisylate, Tofacitinib, Trokendi xr [topiramate er], Tramadol, Amiodarone, and Rituximab    Assessment / Plan:     Visit Diagnoses: Rheumatoid arthritis of multiple sites with negative rheumatoid factor (HCC): Ultrasound of both hands negative for synovitis and tenosynovitis on 03/14/2023.  Patient remains on Rinvoq 15 mg 1 tablet by mouth daily and Arava 20 mg 1 tablet daily.  She is tolerating combination therapy and has not missed any doses recently.  She continues to have chronic pain and stiffness involving multiple joints.  She has stiffness in both hands but no active synovitis was noted today.  She has established care with Dr. Berline Chough for pain management and was started on Cymbalta.  She discontinued Cymbalta due to intolerance.  She is planning on initiating a trial of naltrexone after following up with Dr. Berline Chough on 06/28/2023. She will remain on Rinvoq and Arava as prescribed.  She was advised to notify us if she develops signs or symptoms of a flare.  She will follow-up in the office in 5 months or sooner if needed.  High risk medication use - Rinvoq 15 mg 1 tablet by mouth once daily and Arava 20 mg 1 tablet by mouth daily. CBC and CMP updated 06/12/23.  Her next lab work will be due in September and every 3 months.  TB gold negative on 10/13/22.  No recent or recurrent infections.  Discussed the importance of holding rinvoq and arava if she develops signs or symptoms of an infection and to resume once the infection has completely cleared.   Previous therapy: Methotrexate/Xeljanz/Actemra (GI upset), Enbrel (injection site reaction) and  Orencia/Remicade/Humira/Cimzia/Rituxan/Simponi/Simponi Aria (inadequate response):   Psoriasis: Not currently active.  Contracture of joint of both elbows: Unchanged.   Chronic pain of right knee: No warmth or effusion noted today.   DDD (degenerative disc disease), thoracic: Postural thoracic kyphosis noted.   DDD (degenerative disc disease), lumbar: Chronic pain.  Under the care of Dr. Shon Baton.  She takes flexeril 10 mg 1 tablet by mouth at bedtime for muscle spasms and insomnia.    Osteopenia of multiple sites: DEXA on 11/11/19: The BMD measured at Femur Neck Left is 0.901 g/cm2 with a T-score of -1.0-Normal. Due to update DEXA in November 2025   Piriformis syndrome of right side: under care of Dr. Shon Baton.   Other fatigue: She continues to have significant fatigue on a daily basis.  She remains anemic.  Evaluated by hematology.  Patient plans on following up with her PCP to further discuss her level of fatigue and anemia.  Other insomnia: She takes Flexeril 10 mg at bedtime for muscle spasms and insomnia.  She is also been taking magnesium and has tried melatonin in the past.  Other medical conditions are listed as follows:   History of gastroesophageal reflux (GERD)  History of vitamin D deficiency  History of Graves' disease  History of coronary artery disease  History of diabetes mellitus  History of hyperlipidemia  History of hypertension: BP was 108/68 today in the office.   History of migraine  Orders: No orders of the defined types were  placed in this encounter.  No orders of the defined types were placed in this encounter.    Follow-Up Instructions: Return in about 5 months (around 11/13/2023) for Rheumatoid arthritis.   Gearldine Bienenstock, PA-C  Note - This record has been created using Dragon software.  Chart creation errors have been sought, but may not always  have been located. Such creation errors do not reflect on  the standard of medical care.

## 2023-05-30 NOTE — Progress Notes (Signed)
[name] - 57 y.o. female MRN [id number]  Date of birth: [birth year]  Office Visit Note: Visit Date: 05/30/2023 PCP: Eden Emms, NP Referred by: Eden Emms, NP  Subjective: No chief complaint on file.  HPI: [name] is a pleasant 57 y.o. female who presents today for follow-up of right posterior lateral hip pain.  She has had about 6 sessions of extracorporeal shockwave therapy, continues to notice improvement but not quite as quick as we were hoping.  [place] and feels like walking on the beach exacerbated her pain.  Pain over the lateral side near the greater trochanter.  Less pain near the proximal hamstring insertion and more so over the mid buttock.  Pertinent ROS were reviewed with the patient and found to be negative unless otherwise specified above in HPI.   Assessment & Plan: Visit Diagnoses:  1. Greater trochanteric pain syndrome of right lower extremity   2. Weakness of right hip   3. Piriformis syndrome of right side    Plan: Discussed with [facility] treatment options for her posterior and lateral hip pain.  She has been making improvements with extracorporeal shockwave therapy but the lateral hip was certainly flared up from her ambulation at the beach.  We did perform extracorporeal shockwave therapy to the greater trochanteric/lateral hip as well as the piriformis muscle.  She will follow-up in about 1.5 weeks and given the exacerbation of her pain we will plan for possible ultrasound-guided greater trochanteric injection.  If her pain has subsided by that time we will hold off consider additional ESWT.  I think she is dealing less with ischial tuberosity and insertional hamstring tendinosis and more piriformis syndrome today.  She will continue her home hip stabilization and lateral abduction exercises 3 times weekly.  Follow-up: Return for F/u either 6/11 or 6/12 for US-guided greater troch injection.   Meds &  Orders: No orders of the defined types were placed in this encounter.  No orders of the defined types were placed in this encounter.    Procedures: Procedure: ECSWT Indications:  Ischial Bursitis/Piriformis syndrome   Procedure Details Consent: Risks of procedure as well as the alternatives and risks of each were explained to the patient.  Verbal consent for procedure obtained. Time Out: Verified patient identification, verified procedure, site was marked, verified correct patient position. The area was cleaned with alcohol swab.     The right piriformis and gluteus med/min was targeted for Extracorporeal shockwave therapy.    Preset: Trochanteric bursitis Power Level: 120 mJ Frequency: 14 Hz Impulse/cycles: 3800 (2000 and 1800) Head size: Regular   Patient tolerated procedure well without immediate complications.       Clinical History: No specialty comments available.  She reports that she has never smoked. She has never been exposed to tobacco smoke. She has never used smokeless tobacco. No results for input(s): "HGBA1C", "LABURIC" in the last 8760 hours.  Objective:   Vital Signs: There were no vitals taken for this visit.  Physical Exam  Gen: Well-appearing, in no acute distress; non-toxic CV: Regular Rate. Well-perfused. Warm.  Resp: Breathing unlabored on room air; no wheezing. Psych: Fluid speech in conversation; appropriate affect; normal thought process Neuro: Sensation intact throughout. No gross coordination deficits.   Ortho Exam - Right hip: + TTP over the greater trochanter as well as the mid belly of the piriformis muscle.  No true TTP over the ischial tuberosity today.  There is pain with resisted hip abduction but improved strength.  There is mild pain in the mid buttock with internal/external rotation about the hip.  Imaging: No results found.  Past Medical/Family/Surgical/Social History: Medications & Allergies reviewed per EMR, new medications  updated. Patient Active Problem List   Diagnosis Date Noted   Myofascial pain dysfunction syndrome 03/22/2023   Left foot pain 12/05/2022   Urinary urgency 10/23/2022   Pyelonephritis 10/23/2022   Premature atrial contractions 05/15/2022   PVC's (premature ventricular contractions) 05/15/2022   Low hemoglobin 01/16/2022   Right ear pain 01/09/2022   Sore throat 01/09/2022   OSA on CPAP 08/02/2021   Abnormal MRI, lumbar spine 03/20/2021   Abnormal MRI, thoracic spine 03/20/2021   Chronic mid back pain 03/17/2021   Chronic midline low back pain with left-sided sciatica 03/17/2021   Type 1 diabetes mellitus with diabetic polyneuropathy (HCC) 02/17/2021   Severe obstructive sleep apnea-hypopnea syndrome 02/17/2021   Chronic diastolic CHF (congestive heart failure) (HCC) 12/19/2020   Chronic back pain 12/19/2020   Chronic abdominal pain 12/19/2020   Acute diastolic heart failure (HCC) 12/09/2020   Diabetic retinopathy associated with type 1 diabetes mellitus (HCC) 09/27/2020   Diabetic retinopathy (HCC) 09/27/2020   Graves' disease 09/27/2020   Chronic coronary artery disease 09/27/2020   Hypertension associated with diabetes (HCC) 09/14/2020   Obesity (BMI 30-39.9) 09/14/2020   Preventative health care 09/14/2020   Aortic atherosclerosis (HCC) 09/14/2020   Snoring 05/12/2020   Lung nodule 03/10/2020   Colitis 03/10/2020   Type 1 diabetes mellitus with proliferative retinopathy (HCC) 12/10/2019   Abnormal thyroid function test 12/10/2019   Bruxism (teeth grinding)    Chronic migraine 10/07/2019   Contusion of left hip 07/25/2019   H/O multiple allergies 03/10/2019   Hx of anaphylaxis 03/10/2019   Cough 06/26/2018   PND (post-nasal drip) 06/26/2018   Lumbar strain, initial encounter 04/10/2018   Thoracic myofascial strain 04/10/2018   Vitamin D deficiency 07/04/2017   B12 deficiency 07/04/2017   Abnormal CT scan 06/19/2017   Gastroesophageal reflux disease 06/19/2017    Antiplatelet or antithrombotic long-term use 06/19/2017   Nasal congestion 03/15/2017   Graves disease 02/01/2017   Sleep difficulties 02/01/2017   No energy 02/01/2017   Osteopenia 02/01/2017   Bilateral hand pain 07/16/2016   Acquired trigger finger 07/16/2016   Hypertension, essential 05/08/2016   Foot pain, right 08/30/2014   Shoulder pain, right 08/30/2014   Chronic pain syndrome 01/15/2014   Multiple joint pain 01/15/2014   Lesion of lower eyelid 04/21/2013   Generalized constriction of visual field 03/31/2013   Neoplasm of uncertain behavior of skin of eyelid 03/31/2013   Posterior capsular opacification 03/31/2013   Cyst of left lower eyelid 03/30/2013   Pseudophakia of both eyes 03/30/2013   Prurigo nodularis 02/05/2013   Stasis dermatitis 02/05/2013   Psoriasis 01/21/2013   Trochanteric bursitis 11/10/2012   Achilles tendinitis 07/11/2012   Epiretinal membrane 06/17/2012   Status post cataract extraction 04/30/2012   Pes equinus, acquired 04/23/2012   Tendinitis of ankle 04/23/2012   Proliferative diabetic retinopathy of both eyes (HCC) 01/11/2012   Ongoing use of possibly toxic medication 12/05/2011   Hyperthyroidism 11/09/2010   SINUS TACHYCARDIA 11/08/2010   DERMATITIS, ALLERGIC 07/20/2010   EDEMA 07/12/2010   Dizziness 11/14/2009   ATRIAL FIBRILLATION 07/20/2009   Hx of CABG 06/07/2009   ANGINA, STABLE/EXERTIONAL 06/02/2009   Dyslipidemia, goal LDL below 70 04/26/2009   ACUT MI ANTEROLAT WALL SUBSQT EPIS CARE 04/26/2009  Chest pain 04/26/2009   DIABETIC  RETINOPATHY 04/25/2009   CARPAL TUNNEL SYNDROME, BILATERAL 04/25/2009   TRIGGER FINGER 04/25/2009   MIGRAINE W/O AURA W/INTRACT W/STATUS MIGRAINOSUS 02/19/2008   ALLERGIC RHINITIS, SEASONAL 10/16/2006   CHOLELITHIASIS 10/16/2006   Rheumatoid arthritis (HCC) 10/16/2006   Past Medical History:  Diagnosis Date   ACUT MI ANTEROLAT WALL SUBSQT EPIS CARE    Acute maxillary sinusitis    ALLERGIC RHINITIS,  SEASONAL    Anemia    ARTHRITIS, RHEUMATOID    shoulders and hands Enbrel>leg swelling Dr. Corliss Skains   Atrial fibrillation [facility])    a. after CABG.   Back pain    Bruxism (teeth grinding)    CAD, ARTERY BYPASS GRAFT    a. DES to RCA in 2010 then LAD occlusion s/p CABG 3 06/07/2009 with LIMA to LAD, reverse SVG to D1, reverse SVG to distal RCA. b. Cath 05/08/2016 slightly hypodense region in the intermediate branch, however she had excellent flow, FFR was normal. Vein graft to PDA and the posterolateral branch is patent, patent LIMA to LAD, occluded SVG to diagonal.   CARPAL TUNNEL SYNDROME, BILATERAL    CHOLELITHIASIS    Contrast media allergy    DERMATITIS, ALLERGIC    DIABETES MELLITUS, TYPE I    on insulin pump dx'ed age 57 y.o    DIABETIC  RETINOPATHY    Hiatal hernia    HYPERLIPIDEMIA-MIXED    HYPERTHYROIDISM    Dr. Osborne Casco   IDA (iron deficiency anemia)    Insulin pump in place    Lymphadenopathy of head and neck    MIGRAINE W/O AURA W/INTRACT W/STATUS MIGRAINOSUS 02/19/2008   PONV (postoperative nausea and vomiting)    Psoriasis    SINUS TACHYCARDIA 11/08/2010   Sleep apnea    not using machine yet    SVT (supraventricular tachycardia)    after s/p CABG   Tendonitis    TRIGGER FINGER    all fingers b/l hands    URI    Family History  Problem Relation Age of Onset   Depression Mother    Anxiety disorder Mother    Colon polyps Father    Diabetes Father        2   Hypertension Father    Parkinson's disease Father    Stroke Paternal Grandmother    Heart disease Other    Hypertension Other    Hyperlipidemia Other    Depression Other    Migraines Other    Heart attack Neg Hx    Colon cancer Neg Hx    Stomach cancer Neg Hx    Esophageal cancer Neg Hx    Past Surgical History:  Procedure Laterality Date   ABDOMINAL HYSTERECTOMY     endometriomas b/l; total ? cervix removed; no h/o abnormal pap   Caesarean section     CARDIAC CATHETERIZATION N/A 05/08/2016    Procedure: Left Heart Cath and Cors/Grafts Angiography;  Surgeon: Kathleene Hazel, MD;  Location: [facility];  Service: Cardiovascular;  Laterality: N/A;   CARPAL TUNNEL RELEASE     b/l    CARPAL TUNNEL RELEASE Left 06/06/2021   Procedure: CARPAL TUNNEL RELEASE;  Surgeon: Cindee Salt, MD;  Location: [facility];  Service: Orthopedics;  Laterality: Left;  AXILLARY BLOCK   CATARACT EXTRACTION, BILATERAL     CHOLECYSTECTOMY     COLONOSCOPY WITH PROPOFOL N/A 03/25/2020   Procedure: COLONOSCOPY WITH PROPOFOL;  Surgeon: Wyline Mood, MD;  Location: [facility] ENDOSCOPY;  Service: Gastroenterology;  Laterality: N/A;   CORONARY ARTERY BYPASS GRAFT     ESOPHAGOGASTRODUODENOSCOPY (EGD) WITH PROPOFOL N/A 03/25/2020   Procedure: ESOPHAGOGASTRODUODENOSCOPY (EGD) WITH PROPOFOL;  Surgeon: Wyline Mood, MD;  Location: [facility] END[facility]COPY;  Service: Gastroenterology;  Laterality: N/A;   EYE SURGERY     laser x 2 for retinopathy    LEFT HEART CATH AND CORS/GRAFTS ANGIOGRAPHY N/A 02/22/2020   Procedure: LEFT HEART CATH AND CORS/GRAFTS ANGIOGRAPHY;  Surgeon: Kathleene Hazel, MD;  Location: [facility];  Service: Cardiovascular;  Laterality: N/A;   TRIGGER FINGER RELEASE     b/l fingers all    ULNAR NERVE TRANSPOSITION Left 06/06/2021   Procedure: ULNAR NERVE DECOMPRESSION;  Surgeon: Cindee Salt, MD;  Location: [facility];  Service: Orthopedics;  Laterality: Left;  AXILLARY BLOCK   VITRECTOMY     b/l    Social History   Occupational History   Not on file  Tobacco Use   Smoking status: Never    Passive exposure: Never   Smokeless tobacco: Never  Vaping Use   Vaping Use: Never used  Substance and Sexual Activity   Alcohol use: Yes    Comment: rarely 1 every 6 months   Drug use: No   Sexual activity: Not Currently    Partners: Male    Birth control/protection: None

## 2023-05-30 NOTE — Progress Notes (Signed)
States she is a little sore today But feels the shockwave is helping

## 2023-05-31 ENCOUNTER — Other Ambulatory Visit: Payer: Self-pay

## 2023-06-03 ENCOUNTER — Encounter: Payer: Commercial Managed Care - PPO | Admitting: Physical Therapy

## 2023-06-06 ENCOUNTER — Ambulatory Visit: Payer: Self-pay | Admitting: Nurse Practitioner

## 2023-06-10 ENCOUNTER — Other Ambulatory Visit: Payer: Self-pay | Admitting: Cardiovascular Disease

## 2023-06-10 ENCOUNTER — Other Ambulatory Visit: Payer: Self-pay

## 2023-06-10 ENCOUNTER — Other Ambulatory Visit: Payer: Self-pay | Admitting: Physician Assistant

## 2023-06-10 ENCOUNTER — Encounter: Payer: Commercial Managed Care - PPO | Admitting: Physical Therapy

## 2023-06-10 MED ORDER — ROSUVASTATIN CALCIUM 20 MG PO TABS
20.0000 mg | ORAL_TABLET | Freq: Every day | ORAL | 2 refills | Status: DC
  Filled 2023-06-10: qty 90, 90d supply, fill #0
  Filled 2023-09-08: qty 90, 90d supply, fill #1
  Filled 2023-12-01: qty 90, 90d supply, fill #2

## 2023-06-10 MED ORDER — CYCLOBENZAPRINE HCL 10 MG PO TABS
10.0000 mg | ORAL_TABLET | Freq: Every evening | ORAL | 0 refills | Status: DC | PRN
  Filled 2023-06-10: qty 30, 30d supply, fill #0

## 2023-06-10 MED FILL — Clopidogrel Bisulfate Tab 75 MG (Base Equiv): ORAL | 30 days supply | Qty: 30 | Fill #2 | Status: AC

## 2023-06-10 NOTE — Telephone Encounter (Signed)
Last Fill: 05/13/2023  Next Visit: 06/13/2023  Last Visit: 03/14/2023  Dx: DDD (degenerative disc disease), lumbar   Current Dose per office note on 03/14/2023: Flexeril 10 mg 1 tablet at bedtime as needed for muscle spasms   Okay to refill Flexeril?

## 2023-06-12 ENCOUNTER — Ambulatory Visit: Payer: Commercial Managed Care - PPO | Admitting: Nurse Practitioner

## 2023-06-12 ENCOUNTER — Encounter: Payer: Self-pay | Admitting: Nurse Practitioner

## 2023-06-12 VITALS — BP 108/60 | HR 88 | Temp 97.6°F | Resp 16 | Ht 63.0 in | Wt 178.5 lb

## 2023-06-12 DIAGNOSIS — I5032 Chronic diastolic (congestive) heart failure: Secondary | ICD-10-CM | POA: Diagnosis not present

## 2023-06-12 DIAGNOSIS — H6991 Unspecified Eustachian tube disorder, right ear: Secondary | ICD-10-CM

## 2023-06-12 DIAGNOSIS — R052 Subacute cough: Secondary | ICD-10-CM

## 2023-06-12 DIAGNOSIS — R600 Localized edema: Secondary | ICD-10-CM | POA: Diagnosis not present

## 2023-06-12 DIAGNOSIS — I10 Essential (primary) hypertension: Secondary | ICD-10-CM

## 2023-06-12 DIAGNOSIS — E0590 Thyrotoxicosis, unspecified without thyrotoxic crisis or storm: Secondary | ICD-10-CM | POA: Diagnosis not present

## 2023-06-12 DIAGNOSIS — H9201 Otalgia, right ear: Secondary | ICD-10-CM

## 2023-06-12 NOTE — Patient Instructions (Signed)
Nice to see you today  I will be in touch with the labs once I have reviewed them Follow up with me in 6 months for your physical and labs

## 2023-06-12 NOTE — Progress Notes (Signed)
Established Patient Office Visit  Subjective   Patient ID: [name], female    DOB: 1966-02-05  Age: 57 y.o. MRN: [id number]  Chief Complaint  Patient presents with   Medical Management of Chronic Issues    6 month follow up     HPI  DM: Patient currently followed by [facility] through [facility].  Last A1c was 03/12/2023 at 6.5%  HTN: Patient is followed by cardiology.  She is currently managed on torsemide 20 mg, metoprolol 25 mg twice daily losartan 100 mg, isosorbide 60 mg.  She is followed by Tonny Bollman, MD  Hyperthyroid: Patient is currently followed by Endocrinology. Currently not on any hyperthyroid medications  RA: Patient currently followed by rheumatologist and on Rinvoq  Edema: state sthat she has been having edema. States that she has been taking her medications as prescribed. States that she has been having fatigue that has been long standing issue that has been steadily worsening. State sthat she has been having a cough. Will clear phlgem and worse in the morning. Can be through the day but not as bad. States that she may have an increased cough with laying down    Patient is already on Afrin, fluticasone.  She is recently finished a course of oral steroids and has ipratropium nasal spray at home that she is used in the past without benefit.   Review of Systems  Constitutional:  Positive for malaise/fatigue. Negative for chills and fever.  HENT:  Positive for ear pain. Negative for ear discharge.   Respiratory:  Negative for shortness of breath.   Cardiovascular:  Positive for leg swelling. Negative for chest pain.  Neurological:  Negative for tingling.      Objective:     BP 108/60   Pulse 88   Temp 97.6 F (36.4 C)   Resp 16   Ht 5\' 3"  (1.6 m)   Wt 178 lb 8 oz (81 kg)   SpO2 96%   BMI 31.62 kg/m  BP Readings from Last 3 Encounters:  06/12/23 108/60  04/11/23 130/60  03/22/23 117/73   Wt Readings from Last 3  Encounters:  06/12/23 178 lb 8 oz (81 kg)  04/11/23 175 lb 4 oz (79.5 kg)  03/22/23 175 lb 3.2 oz (79.5 kg)      Physical Exam Vitals and nursing note reviewed.  Constitutional:      Appearance: Normal appearance.  HENT:     Right Ear: Ear canal and external ear normal.     Left Ear: Ear canal and external ear normal.     Ears:     Comments: Clear fluid behind bilateral tympanic membranes  Cardiovascular:     Rate and Rhythm: Normal rate and regular rhythm.     Pulses:          Dorsalis pedis pulses are 2+ on the right side and 2+ on the left side.     Heart sounds: Normal heart sounds.  Pulmonary:     Effort: Pulmonary effort is normal.     Breath sounds: Normal breath sounds.  Musculoskeletal:     Right lower leg: 1+ Pitting Edema present.     Left lower leg: 1+ Pitting Edema present.  Neurological:     Mental Status: She is alert.      No results found for any visits on 06/12/23.    The ASCVD Risk score (Arnett DK, et al., 2019) failed to calculate for the following reasons:   The patient  has a prior MI or stroke diagnosis    Assessment & Plan:   Problem List Items Addressed This Visit       Cardiovascular and Mediastinum   Hypertension, essential - Primary    Patient is currently following cardiology patient is on isosorbide, torsemide, losartan, metoprolol.  Continue taking medication as prescribed follow-up cardiologist recommended      Relevant Orders   CBC   Comprehensive metabolic panel   Chronic diastolic CHF (congestive heart failure) (HCC)    History of the same has been on furosemide in the past and metaxalone per patient report.  Will switch to torsemide she started hearing fluid and has pitting edema in office will check BNP.      Relevant Orders   Brain natriuretic peptide     Endocrine   Hyperthyroidism    History of the same is followed by endocrinology.  Not currently on any medications in the setting of having fatigue we will check to  make sure she has not went to hypothyroidism      Relevant Orders   TSH     Other   Cough    Patient on the oral course of steroids, Flonase, Afrin, ipratropium nasal spray.  Patient also currently on Protonix 40 mg twice daily      Otalgia of right ear    Likely secondary to eustachian tube dysfunction.  Patient currently on Flonase and Afrin.  Recently follow-up finished a course of oral prednisone.  Patient has tried ipratropium nasal spray in the past without benefit regarding her PND and cough      Lower extremity edema    In the setting of history of hypertensive and CHF will check BMP.  Patient is on therapy inclusive of torsemide 20 mg daily      Relevant Orders   Brain natriuretic peptide   Other Visit Diagnoses     Eustachian tube disorder, right           Return in about 6 months (around 12/12/2023) for CPE and Labs.    Audria Nine, NP

## 2023-06-12 NOTE — Assessment & Plan Note (Addendum)
Patient on the oral course of steroids, Flonase, Afrin, ipratropium nasal spray.  Patient also currently on Protonix 40 mg twice daily

## 2023-06-12 NOTE — Assessment & Plan Note (Signed)
Likely secondary to eustachian tube dysfunction.  Patient currently on Flonase and Afrin.  Recently follow-up finished a course of oral prednisone.  Patient has tried ipratropium nasal spray in the past without benefit regarding her PND and cough

## 2023-06-12 NOTE — Assessment & Plan Note (Signed)
In the setting of history of hypertensive and CHF will check BMP.  Patient is on therapy inclusive of torsemide 20 mg daily

## 2023-06-12 NOTE — Assessment & Plan Note (Signed)
History of the same has been on furosemide in the past and metaxalone per patient report.  Will switch to torsemide she started hearing fluid and has pitting edema in office will check BNP.

## 2023-06-12 NOTE — Assessment & Plan Note (Signed)
Patient is currently following cardiology patient is on isosorbide, torsemide, losartan, metoprolol.  Continue taking medication as prescribed follow-up cardiologist recommended

## 2023-06-12 NOTE — Assessment & Plan Note (Signed)
History of the same is followed by endocrinology.  Not currently on any medications in the setting of having fatigue we will check to make sure she has not went to hypothyroidism

## 2023-06-13 ENCOUNTER — Ambulatory Visit: Payer: Commercial Managed Care - PPO | Attending: Physician Assistant | Admitting: Physician Assistant

## 2023-06-13 ENCOUNTER — Encounter: Payer: Self-pay | Admitting: Physician Assistant

## 2023-06-13 VITALS — BP 108/68 | HR 90 | Resp 12 | Ht 63.0 in | Wt 176.0 lb

## 2023-06-13 DIAGNOSIS — M5136 Other intervertebral disc degeneration, lumbar region: Secondary | ICD-10-CM

## 2023-06-13 DIAGNOSIS — Z8719 Personal history of other diseases of the digestive system: Secondary | ICD-10-CM

## 2023-06-13 DIAGNOSIS — G5701 Lesion of sciatic nerve, right lower limb: Secondary | ICD-10-CM

## 2023-06-13 DIAGNOSIS — M8589 Other specified disorders of bone density and structure, multiple sites: Secondary | ICD-10-CM | POA: Diagnosis not present

## 2023-06-13 DIAGNOSIS — M5134 Other intervertebral disc degeneration, thoracic region: Secondary | ICD-10-CM | POA: Diagnosis not present

## 2023-06-13 DIAGNOSIS — M24521 Contracture, right elbow: Secondary | ICD-10-CM

## 2023-06-13 DIAGNOSIS — M25561 Pain in right knee: Secondary | ICD-10-CM | POA: Diagnosis not present

## 2023-06-13 DIAGNOSIS — R5383 Other fatigue: Secondary | ICD-10-CM | POA: Diagnosis not present

## 2023-06-13 DIAGNOSIS — Z79899 Other long term (current) drug therapy: Secondary | ICD-10-CM | POA: Diagnosis not present

## 2023-06-13 DIAGNOSIS — L409 Psoriasis, unspecified: Secondary | ICD-10-CM

## 2023-06-13 DIAGNOSIS — M0609 Rheumatoid arthritis without rheumatoid factor, multiple sites: Secondary | ICD-10-CM | POA: Diagnosis not present

## 2023-06-13 DIAGNOSIS — Z8669 Personal history of other diseases of the nervous system and sense organs: Secondary | ICD-10-CM

## 2023-06-13 DIAGNOSIS — G4709 Other insomnia: Secondary | ICD-10-CM

## 2023-06-13 DIAGNOSIS — M24522 Contracture, left elbow: Secondary | ICD-10-CM

## 2023-06-13 DIAGNOSIS — G8929 Other chronic pain: Secondary | ICD-10-CM

## 2023-06-13 DIAGNOSIS — Z8639 Personal history of other endocrine, nutritional and metabolic disease: Secondary | ICD-10-CM

## 2023-06-13 DIAGNOSIS — Z8679 Personal history of other diseases of the circulatory system: Secondary | ICD-10-CM

## 2023-06-13 LAB — COMPREHENSIVE METABOLIC PANEL
ALT: 24 U/L (ref 0–35)
AST: 32 U/L (ref 0–37)
Albumin: 4.4 g/dL (ref 3.5–5.2)
Alkaline Phosphatase: 67 U/L (ref 39–117)
BUN: 15 mg/dL (ref 6–23)
CO2: 29 mEq/L (ref 19–32)
Calcium: 8.9 mg/dL (ref 8.4–10.5)
Chloride: 96 mEq/L (ref 96–112)
Creatinine, Ser: 0.92 mg/dL (ref 0.40–1.20)
GFR: 70.04 mL/min (ref >60.00)
Glucose, Bld: 118 mg/dL — ABNORMAL HIGH (ref 70–99)
Potassium: 3.9 mEq/L (ref 3.5–5.1)
Sodium: 134 mEq/L — ABNORMAL LOW (ref 135–145)
Total Bilirubin: 0.5 mg/dL (ref 0.2–1.2)
Total Protein: 6.6 g/dL (ref 6.0–8.3)

## 2023-06-13 LAB — CBC
HCT: 34.6 % — ABNORMAL LOW (ref 36.0–46.0)
Hemoglobin: 11.8 g/dL — ABNORMAL LOW (ref 12.0–15.0)
MCHC: 34.1 g/dL (ref 30.0–36.0)
MCV: 90.2 fl (ref 78.0–100.0)
Platelets: 231 10*3/uL (ref 150.0–400.0)
RBC: 3.84 Mil/uL — ABNORMAL LOW (ref 3.87–5.11)
RDW: 13.7 % (ref 11.5–15.5)
WBC: 4.2 10*3/uL (ref 4.0–10.5)

## 2023-06-13 LAB — TSH: TSH: 1.34 u[IU]/mL (ref 0.35–5.50)

## 2023-06-13 LAB — BRAIN NATRIURETIC PEPTIDE: Pro B Natriuretic peptide (BNP): 8 pg/mL (ref 0.0–100.0)

## 2023-06-13 NOTE — Patient Instructions (Signed)
Standing Labs We placed an order today for your standing lab work.   Please have your standing labs drawn in September and every 3 months   Please have your labs drawn 2 weeks prior to your appointment so that the provider can discuss your lab results at your appointment, if possible.  Please note that you may see your imaging and lab results in MyChart before we have reviewed them. We will contact you once all results are reviewed. Please allow our office up to 72 hours to thoroughly review all of the results before contacting the office for clarification of your results.  WALK-IN LAB HOURS  Monday through Thursday from 8:00 am -12:30 pm and 1:00 pm-5:00 pm and Friday from 8:00 am-12:00 pm.  Patients with office visits requiring labs will be seen before walk-in labs.  You may encounter longer than normal wait times. Please allow additional time. Wait times may be shorter on  Monday and Thursday afternoons.  We do not book appointments for walk-in labs. We appreciate your patience and understanding with our staff.   Labs are drawn by [facility]. Please bring your co-pay at the time of your lab draw.  You may receive a bill from [facility] for your lab work.  Please note if you are on Hydroxychloroquine and and an order has been placed for a Hydroxychloroquine level,  you will need to have it drawn 4 hours or more after your last dose.  If you wish to have your labs drawn at another location, please call the office 24 hours in advance so we can fax the orders.  The office is located at [address], [address], [place], [place] [address]   If you have any questions regarding directions or hours of operation,  please call [phone].   As a reminder, please drink plenty of water prior to coming for your lab work. Thanks!  

## 2023-06-17 ENCOUNTER — Encounter: Payer: Commercial Managed Care - PPO | Admitting: Physical Therapy

## 2023-06-17 ENCOUNTER — Encounter: Payer: Self-pay | Admitting: Sports Medicine

## 2023-06-17 ENCOUNTER — Other Ambulatory Visit: Payer: Self-pay

## 2023-06-17 ENCOUNTER — Ambulatory Visit: Payer: Commercial Managed Care - PPO | Admitting: Sports Medicine

## 2023-06-17 VITALS — Ht 63.0 in | Wt 176.0 lb

## 2023-06-17 DIAGNOSIS — M545 Low back pain, unspecified: Secondary | ICD-10-CM

## 2023-06-17 DIAGNOSIS — M25551 Pain in right hip: Secondary | ICD-10-CM | POA: Diagnosis not present

## 2023-06-17 MED ORDER — METHYLPREDNISOLONE ACETATE 40 MG/ML IJ SUSP
40.0000 mg | INTRAMUSCULAR | Status: AC | PRN
Start: 2023-06-17 — End: 2023-06-17
  Administered 2023-06-17: 40 mg via INTRA_ARTICULAR

## 2023-06-17 MED ORDER — BUPIVACAINE HCL 0.25 % IJ SOLN
2.0000 mL | INTRAMUSCULAR | Status: AC | PRN
Start: 2023-06-17 — End: 2023-06-17
  Administered 2023-06-17: 2 mL via INTRA_ARTICULAR

## 2023-06-17 MED ORDER — LIDOCAINE HCL 1 % IJ SOLN
2.0000 mL | INTRAMUSCULAR | Status: AC | PRN
Start: 2023-06-17 — End: 2023-06-17
  Administered 2023-06-17: 2 mL

## 2023-06-17 NOTE — Progress Notes (Signed)
[name] - 57 y.o. female MRN [id number]  Date of birth: Jul 26, 1966  Office Visit Note: Visit Date: 06/17/2023 PCP: Lisa Emms, NP Referred by: Lisa Emms, NP  Subjective: Chief Complaint  Patient presents with   Right Hip - Pain, Follow-up    Patient returns for follow up of right hip pain. She has had previous relief from shockwave therapy, however, does not notice that it has helped the past two times. She has pain with walking and climbing the stairs. If pain is really bad, she will take ibuprofen. This tends to help some. She has been having low back pain since last week. She has not had an injury. Pain is across the lower back and does not radiate into legs.   HPI: [name] is a pleasant 57 y.o. female who presents today for acute on chronic right hip pain.  [name] has been receiving extracorporeal shockwave therapy for the insertional hamstring as well as more recently over the lateral hip.  Her buttock and hamstring pain is significantly improved but she has been having more pain over the lateral hip.  Has been making good progress with extracorporeal shockwave therapy although the last 2 sessions have not been as helpful.  Using Tylenol, very infrequent ibuprofen as needed.  Also states that her back has flared up as well so her whole body is slightly more sore/painful.  Denies any radicular symptoms or numbness tingling into the legs.  Pertinent ROS were reviewed with the patient and found to be negative unless otherwise specified above in HPI.   Assessment & Plan: Visit Diagnoses:  1. Greater trochanteric pain syndrome of right lower extremity   2. Pain in right hip   3. Acute right-sided low back pain without sciatica    Plan: Discussed with [name] we made great progress from her insertional hamstring tendinosis with shockwave, initially had partial relief from the lateral hip and gluteus muscles but the last 2 sessions were not as helpful.  Through shared  decision-making, elected to proceed with ultrasound-guided greater trochanteric injection, patient tolerated well.  She may use ice, Tylenol for any postinjection pain.  I would like her to have about 48 hours of modified activity and rest, she then may return to her home hip stabilization exercises and routine activity.  I would like to see how she does in about a month after this, she may call me or return sooner if any issues arise.  Follow-up: Return in about 1 month (around 07/17/2023), or if symptoms worsen or fail to improve, for R-hip .   Meds & Orders: No orders of the defined types were placed in this encounter.   Orders Placed This Encounter  Procedures   Large Joint Inj   US Guided Needle Placement - No Linked Charges     Procedures: Large Joint Inj: R greater trochanter on 06/17/2023 3:13 PM Indications: pain and diagnostic evaluation Details: 22 G 3.5 in needle, ultrasound-guided lateral approach Medications: 2 mL lidocaine 1 %; 2 mL bupivacaine 0.25 %; 40 mg methylPREDNISolone acetate 40 MG/ML Outcome: tolerated well, no immediate complications  US-Guided Greater Trochanteric Bursa Injection, Right After discussion on risks/benefits/indications and informed verbal consent was obtained, a timeout was performed. The patient was lying in lateral recumbent position on exam table. Using ultrasound guidance, the greater trochanter was identified. The area overlying the trochanteric bursa was then prepped with Betadine and alcohol swabs. Following sterile precautions, ultrasound was reapplied to visualize needle guidance with a  22-gauge 3.5" needle utilizing an in-plane approach to inject the bursa with 2:2:1 lidocaine:bupivicaine:methylprednisolone. Delivery of the injectate was visualized into the region of hypoechoic fluid of the greater trochanteric bursa. Patient tolerated procedure well without immediate complications.    Procedure, treatment alternatives, risks and benefits  explained, specific risks discussed. Consent was given by the patient. Immediately prior to procedure a time out was called to verify the correct patient, procedure, equipment, support staff and site/side marked as required. Patient was prepped and draped in the usual sterile fashion.      -1 cc of 30 mg/mL of Toradol was administered into the right buttock today     Clinical History: No specialty comments available.  She reports that she has never smoked. She has never been exposed to tobacco smoke. She has never used smokeless tobacco. No results for input(s): "HGBA1C", "LABURIC" in the last 8760 hours.  Objective:   Vital Signs: Ht 5\' 3"  (1.6 m)   Wt 176 lb (79.8 kg)   BMI 31.18 kg/m   Physical Exam  Gen: Well-appearing, in no acute distress; non-toxic CV: Well-perfused. Warm.  Resp: Breathing unlabored on room air; no wheezing. Psych: Fluid speech in conversation; appropriate affect; normal thought process Neuro: Sensation intact throughout. No gross coordination deficits.   Ortho Exam - Right hip: + TTP over greater trochanter.  The right hip has no mechanical blocks to internal or external rotation.  There is mild weakness to resisted lateral abduction on the right compared to the left side, although improved from prior visits.  No surrounding redness or swelling.  Imaging: No results found.  Past Medical/Family/Surgical/Social History: Medications & Allergies reviewed per EMR, new medications updated. Patient Active Problem List   Diagnosis Date Noted   Lower extremity edema 06/12/2023   Myofascial pain dysfunction syndrome 03/22/2023   Left foot pain 12/05/2022   Urinary urgency 10/23/2022   Pyelonephritis 10/23/2022   Premature atrial contractions 05/15/2022   PVC's (premature ventricular contractions) 05/15/2022   Low hemoglobin 01/16/2022   Otalgia of right ear 01/09/2022   Sore throat 01/09/2022   OSA on CPAP 08/02/2021   Abnormal MRI, lumbar spine 03/20/2021    Abnormal MRI, thoracic spine 03/20/2021   Chronic mid back pain 03/17/2021   Chronic midline low back pain with left-sided sciatica 03/17/2021   Type 1 diabetes mellitus with diabetic polyneuropathy (HCC) 02/17/2021   Severe obstructive sleep apnea-hypopnea syndrome 02/17/2021   Chronic diastolic CHF (congestive heart failure) (HCC) 12/19/2020   Chronic back pain 12/19/2020   Chronic abdominal pain 12/19/2020   Acute diastolic heart failure (HCC) 12/09/2020   Diabetic retinopathy associated with type 1 diabetes mellitus (HCC) 09/27/2020   Diabetic retinopathy (HCC) 09/27/2020   Graves' disease 09/27/2020   Chronic coronary artery disease 09/27/2020   Hypertension associated with diabetes (HCC) 09/14/2020   Obesity (BMI 30-39.9) 09/14/2020   Preventative health care 09/14/2020   Aortic atherosclerosis (HCC) 09/14/2020   Snoring 05/12/2020   Lung nodule 03/10/2020   Colitis 03/10/2020   Type 1 diabetes mellitus with proliferative retinopathy (HCC) 12/10/2019   Abnormal thyroid function test 12/10/2019   Bruxism (teeth grinding)    Chronic migraine 10/07/2019   Contusion of left hip 07/25/2019   H/O multiple allergies 03/10/2019   Hx of anaphylaxis 03/10/2019   Cough 06/26/2018   PND (post-nasal drip) 06/26/2018   Lumbar strain, initial encounter 04/10/2018   Thoracic myofascial strain 04/10/2018   Vitamin D deficiency 07/04/2017   B12 deficiency 07/04/2017   Abnormal CT scan  06/19/2017   Gastroesophageal reflux disease 06/19/2017   Antiplatelet or antithrombotic long-term use 06/19/2017   Nasal congestion 03/15/2017   Graves disease 02/01/2017   Sleep difficulties 02/01/2017   No energy 02/01/2017   Osteopenia 02/01/2017   Bilateral hand pain 07/16/2016   Acquired trigger finger 07/16/2016   Hypertension, essential 05/08/2016   Foot pain, right 08/30/2014   Shoulder pain, right 08/30/2014   Chronic pain syndrome 01/15/2014   Multiple joint pain 01/15/2014   Lesion of  lower eyelid 04/21/2013   Generalized constriction of visual field 03/31/2013   Neoplasm of uncertain behavior of skin of eyelid 03/31/2013   Posterior capsular opacification 03/31/2013   Cyst of left lower eyelid 03/30/2013   Pseudophakia of both eyes 03/30/2013   Prurigo nodularis 02/05/2013   Stasis dermatitis 02/05/2013   Psoriasis 01/21/2013   Trochanteric bursitis 11/10/2012   Achilles tendinitis 07/11/2012   Epiretinal membrane 06/17/2012   Status post cataract extraction 04/30/2012   Pes equinus, acquired 04/23/2012   Tendinitis of ankle 04/23/2012   Proliferative diabetic retinopathy of both eyes (HCC) 01/11/2012   Ongoing use of possibly toxic medication 12/05/2011   Hyperthyroidism 11/09/2010   SINUS TACHYCARDIA 11/08/2010   DERMATITIS, ALLERGIC 07/20/2010   EDEMA 07/12/2010   Dizziness 11/14/2009   ATRIAL FIBRILLATION 07/20/2009   Hx of CABG 06/07/2009   ANGINA, STABLE/EXERTIONAL 06/02/2009   Dyslipidemia, goal LDL below 70 04/26/2009   ACUT MI ANTEROLAT WALL SUBSQT EPIS CARE 04/26/2009   Chest pain 04/26/2009   DIABETIC  RETINOPATHY 04/25/2009   CARPAL TUNNEL SYNDROME, BILATERAL 04/25/2009   TRIGGER FINGER 04/25/2009   MIGRAINE W/O AURA W/INTRACT W/STATUS MIGRAINOSUS 02/19/2008   ALLERGIC RHINITIS, SEASONAL 10/16/2006   CHOLELITHIASIS 10/16/2006   Rheumatoid arthritis (HCC) 10/16/2006   Past Medical History:  Diagnosis Date   ACUT MI ANTEROLAT WALL SUBSQT EPIS CARE    Acute maxillary sinusitis    ALLERGIC RHINITIS, SEASONAL    Anemia    ARTHRITIS, RHEUMATOID    shoulders and hands Enbrel>leg swelling Dr. Corliss Skains   Atrial fibrillation [facility])    a. after CABG.   Back pain    Bruxism (teeth grinding)    CAD, ARTERY BYPASS GRAFT    a. DES to RCA in 2010 then LAD occlusion s/p CABG 3 06/07/2009 with LIMA to LAD, reverse SVG to D1, reverse SVG to distal RCA. b. Cath 05/08/2016 slightly hypodense region in the intermediate branch, however she had excellent flow,  FFR was normal. Vein graft to PDA and the posterolateral branch is patent, patent LIMA to LAD, occluded SVG to diagonal.   CARPAL TUNNEL SYNDROME, BILATERAL    CHOLELITHIASIS    Contrast media allergy    DERMATITIS, ALLERGIC    DIABETES MELLITUS, TYPE I    on insulin pump dx'ed age 44 y.o    DIABETIC  RETINOPATHY    Hiatal hernia    HYPERLIPIDEMIA-MIXED    HYPERTHYROIDISM    Dr. Osborne Casco   IDA (iron deficiency anemia)    Insulin pump in place    Lymphadenopathy of head and neck    MIGRAINE W/O AURA W/INTRACT W/STATUS MIGRAINOSUS 02/19/2008   PONV (postoperative nausea and vomiting)    Psoriasis    SINUS TACHYCARDIA 11/08/2010   Sleep apnea    not using machine yet    SVT (supraventricular tachycardia)    after s/p CABG   Tendonitis    TRIGGER FINGER    all fingers b/l hands    URI    Family History  Problem Relation Age of Onset   Depression Mother    Anxiety disorder Mother    Colon polyps Father    Diabetes Father        2   Hypertension Father    Parkinson's disease Father    Stroke Paternal Grandmother    Heart disease Other    Hypertension Other    Hyperlipidemia Other    Depression Other    Migraines Other    Heart attack Neg Hx    Colon cancer Neg Hx    Stomach cancer Neg Hx    Esophageal cancer Neg Hx    Past Surgical History:  Procedure Laterality Date   ABDOMINAL HYSTERECTOMY     endometriomas b/l; total ? cervix removed; no h/o abnormal pap   Caesarean section     CARDIAC CATHETERIZATION N/A 05/08/2016   Procedure: Left Heart Cath and Cors/Grafts Angiography;  Surgeon: Kathleene Hazel, MD;  Location: [facility];  Service: Cardiovascular;  Laterality: N/A;   CARPAL TUNNEL RELEASE     b/l    CARPAL TUNNEL RELEASE Left 06/06/2021   Procedure: CARPAL TUNNEL RELEASE;  Surgeon: Cindee Salt, MD;  Location: [facility];  Service: Orthopedics;  Laterality: Left;  AXILLARY BLOCK   CATARACT EXTRACTION, BILATERAL      CHOLECYSTECTOMY     COLONOSCOPY WITH PROPOFOL N/A 03/25/2020   Procedure: COLONOSCOPY WITH PROPOFOL;  Surgeon: Wyline Mood, MD;  Location: [facility];  Service: Gastroenterology;  Laterality: N/A;   CORONARY ARTERY BYPASS GRAFT     ESOPHAGOGASTRODUODENOSCOPY (EGD) WITH PROPOFOL N/A 03/25/2020   Procedure: ESOPHAGOGASTRODUODENOSCOPY (EGD) WITH PROPOFOL;  Surgeon: Wyline Mood, MD;  Location: [facility] END[facility]COPY;  Service: Gastroenterology;  Laterality: N/A;   EYE SURGERY     laser x 2 for retinopathy    LEFT HEART CATH AND CORS/GRAFTS ANGIOGRAPHY N/A 02/22/2020   Procedure: LEFT HEART CATH AND CORS/GRAFTS ANGIOGRAPHY;  Surgeon: Kathleene Hazel, MD;  Location: [facility];  Service: Cardiovascular;  Laterality: N/A;   TRIGGER FINGER RELEASE     b/l fingers all    ULNAR NERVE TRANSPOSITION Left 06/06/2021   Procedure: ULNAR NERVE DECOMPRESSION;  Surgeon: Cindee Salt, MD;  Location: [facility];  Service: Orthopedics;  Laterality: Left;  AXILLARY BLOCK   VITRECTOMY     b/l    Social History   Occupational History   Not on file  Tobacco Use   Smoking status: Never    Passive exposure: Never   Smokeless tobacco: Never  Vaping Use   Vaping Use: Never used  Substance and Sexual Activity   Alcohol use: Yes    Comment: rarely 1 every 6 months   Drug use: No   Sexual activity: Not Currently    Partners: Male    Birth control/protection: None

## 2023-06-18 ENCOUNTER — Ambulatory Visit
Admission: EM | Admit: 2023-06-18 | Discharge: 2023-06-18 | Disposition: A | Discharge status: Home / Self Care | Payer: Commercial Managed Care - PPO | Attending: Physician Assistant | Admitting: Physician Assistant

## 2023-06-18 ENCOUNTER — Other Ambulatory Visit: Payer: Self-pay

## 2023-06-18 DIAGNOSIS — M545 Low back pain, unspecified: Secondary | ICD-10-CM | POA: Diagnosis not present

## 2023-06-18 DIAGNOSIS — E1065 Type 1 diabetes mellitus with hyperglycemia: Secondary | ICD-10-CM | POA: Diagnosis not present

## 2023-06-18 DIAGNOSIS — N760 Acute vaginitis: Secondary | ICD-10-CM | POA: Insufficient documentation

## 2023-06-18 DIAGNOSIS — R350 Frequency of micturition: Secondary | ICD-10-CM | POA: Insufficient documentation

## 2023-06-18 DIAGNOSIS — B9689 Other specified bacterial agents as the cause of diseases classified elsewhere: Secondary | ICD-10-CM | POA: Diagnosis not present

## 2023-06-18 LAB — URINALYSIS, W/ REFLEX TO CULTURE (INFECTION SUSPECTED)
Bilirubin Urine: NEGATIVE
Glucose, UA: 100 mg/dL — AB
Hgb urine dipstick: NEGATIVE
Ketones, ur: NEGATIVE mg/dL
Leukocytes,Ua: NEGATIVE
Nitrite: NEGATIVE
Protein, ur: NEGATIVE mg/dL
Specific Gravity, Urine: <1.005 — ABNORMAL LOW (ref 1.005–1.030)
pH: 5 (ref 5.0–8.0)

## 2023-06-18 LAB — WET PREP, GENITAL
Sperm: NONE SEEN
Trich, Wet Prep: NONE SEEN
WBC, Wet Prep HPF POC: <10 — AB (ref <10)
Yeast Wet Prep HPF POC: NONE SEEN

## 2023-06-18 LAB — GLUCOSE, CAPILLARY: Glucose-Capillary: 172 mg/dL — ABNORMAL HIGH (ref 70–99)

## 2023-06-18 MED ORDER — METRONIDAZOLE 500 MG PO TABS
500.0000 mg | ORAL_TABLET | Freq: Two times a day (BID) | ORAL | 0 refills | Status: AC
  Filled 2023-06-18: qty 14, 7d supply, fill #0

## 2023-06-18 NOTE — ED Provider Notes (Signed)
[facility]    CSN: [id number] Arrival date & time: 06/18/23  1256      History   Chief Complaint Chief Complaint  Patient presents with   Back Pain   Urinary Frequency    HPI [name] is a 57 y.o. female presenting for urinary frequency/urgency and lower back pain for the past couple of days.  She denies dysuria, vaginal itching or discharge.  Denies abdominal pain.  Reports some nausea.  No fevers.  Denies vomiting or diarrhea.  Reports elevated blood sugars over the past weekend.  She is an insulin-dependent diabetic.  Reports blood sugars elevated to the 260s mostly at nighttime.  States her last A1c was not too long ago and it was 6.5.  She reports that she had a corticosteroid injection into her hip yesterday.  HPI  Past Medical History:  Diagnosis Date   ACUT MI ANTEROLAT WALL SUBSQT EPIS CARE    Acute maxillary sinusitis    ALLERGIC RHINITIS, SEASONAL    Anemia    ARTHRITIS, RHEUMATOID    shoulders and hands Enbrel>leg swelling Dr. Corliss Skains   Atrial fibrillation [facility])    a. after CABG.   Back pain    Bruxism (teeth grinding)    CAD, ARTERY BYPASS GRAFT    a. DES to RCA in 2010 then LAD occlusion s/p CABG 3 06/07/2009 with LIMA to LAD, reverse SVG to D1, reverse SVG to distal RCA. b. Cath 05/08/2016 slightly hypodense region in the intermediate branch, however she had excellent flow, FFR was normal. Vein graft to PDA and the posterolateral branch is patent, patent LIMA to LAD, occluded SVG to diagonal.   CARPAL TUNNEL SYNDROME, BILATERAL    CHOLELITHIASIS    Contrast media allergy    DERMATITIS, ALLERGIC    DIABETES MELLITUS, TYPE I    on insulin pump dx'ed age 57 y.o    DIABETIC  RETINOPATHY    Hiatal hernia    HYPERLIPIDEMIA-MIXED    HYPERTHYROIDISM    Dr. Osborne Casco   IDA (iron deficiency anemia)    Insulin pump in place    Lymphadenopathy of head and neck    MIGRAINE W/O AURA W/INTRACT W/STATUS MIGRAINOSUS 02/19/2008   PONV  (postoperative nausea and vomiting)    Psoriasis    SINUS TACHYCARDIA 11/08/2010   Sleep apnea    not using machine yet    SVT (supraventricular tachycardia)    after s/p CABG   Tendonitis    TRIGGER FINGER    all fingers b/l hands    URI     Patient Active Problem List   Diagnosis Date Noted   Lower extremity edema 06/12/2023   Myofascial pain dysfunction syndrome 03/22/2023   Left foot pain 12/05/2022   Urinary urgency 10/23/2022   Pyelonephritis 10/23/2022   Premature atrial contractions 05/15/2022   PVC's (premature ventricular contractions) 05/15/2022   Low hemoglobin 01/16/2022   Otalgia of right ear 01/09/2022   Sore throat 01/09/2022   OSA on CPAP 08/02/2021   Abnormal MRI, lumbar spine 03/20/2021   Abnormal MRI, thoracic spine 03/20/2021   Chronic mid back pain 03/17/2021   Chronic midline low back pain with left-sided sciatica 03/17/2021   Type 1 diabetes mellitus with diabetic polyneuropathy (HCC) 02/17/2021   Severe obstructive sleep apnea-hypopnea syndrome 02/17/2021   Chronic diastolic CHF (congestive heart failure) (HCC) 12/19/2020   Chronic back pain 12/19/2020   Chronic abdominal pain 12/19/2020   Acute diastolic heart failure (HCC) 12/09/2020   Diabetic retinopathy  associated with type 1 diabetes mellitus (HCC) 09/27/2020   Diabetic retinopathy (HCC) 09/27/2020   Graves' disease 09/27/2020   Chronic coronary artery disease 09/27/2020   Hypertension associated with diabetes (HCC) 09/14/2020   Obesity (BMI 30-39.9) 09/14/2020   Preventative health care 09/14/2020   Aortic atherosclerosis (HCC) 09/14/2020   Snoring 05/12/2020   Lung nodule 03/10/2020   Colitis 03/10/2020   Type 1 diabetes mellitus with proliferative retinopathy (HCC) 12/10/2019   Abnormal thyroid function test 12/10/2019   Bruxism (teeth grinding)    Chronic migraine 10/07/2019   Contusion of left hip 07/25/2019   H/O multiple allergies 03/10/2019   Hx of anaphylaxis 03/10/2019    Cough 06/26/2018   PND (post-nasal drip) 06/26/2018   Lumbar strain, initial encounter 04/10/2018   Thoracic myofascial strain 04/10/2018   Vitamin D deficiency 07/04/2017   B12 deficiency 07/04/2017   Abnormal CT scan 06/19/2017   Gastroesophageal reflux disease 06/19/2017   Antiplatelet or antithrombotic long-term use 06/19/2017   Nasal congestion 03/15/2017   Graves disease 02/01/2017   Sleep difficulties 02/01/2017   No energy 02/01/2017   Osteopenia 02/01/2017   Bilateral hand pain 07/16/2016   Acquired trigger finger 07/16/2016   Hypertension, essential 05/08/2016   Foot pain, right 08/30/2014   Shoulder pain, right 08/30/2014   Chronic pain syndrome 01/15/2014   Multiple joint pain 01/15/2014   Lesion of lower eyelid 04/21/2013   Generalized constriction of visual field 03/31/2013   Neoplasm of uncertain behavior of skin of eyelid 03/31/2013   Posterior capsular opacification 03/31/2013   Cyst of left lower eyelid 03/30/2013   Pseudophakia of both eyes 03/30/2013   Prurigo nodularis 02/05/2013   Stasis dermatitis 02/05/2013   Psoriasis 01/21/2013   Trochanteric bursitis 11/10/2012   Achilles tendinitis 07/11/2012   Epiretinal membrane 06/17/2012   Status post cataract extraction 04/30/2012   Pes equinus, acquired 04/23/2012   Tendinitis of ankle 04/23/2012   Proliferative diabetic retinopathy of both eyes (HCC) 01/11/2012   Ongoing use of possibly toxic medication 12/05/2011   Hyperthyroidism 11/09/2010   SINUS TACHYCARDIA 11/08/2010   DERMATITIS, ALLERGIC 07/20/2010   EDEMA 07/12/2010   Dizziness 11/14/2009   ATRIAL FIBRILLATION 07/20/2009   Hx of CABG 06/07/2009   ANGINA, STABLE/EXERTIONAL 06/02/2009   Dyslipidemia, goal LDL below 70 04/26/2009   ACUT MI ANTEROLAT WALL SUBSQT EPIS CARE 04/26/2009   Chest pain 04/26/2009   DIABETIC  RETINOPATHY 04/25/2009   CARPAL TUNNEL SYNDROME, BILATERAL 04/25/2009   TRIGGER FINGER 04/25/2009   MIGRAINE W/O AURA  W/INTRACT W/STATUS MIGRAINOSUS 02/19/2008   ALLERGIC RHINITIS, SEASONAL 10/16/2006   CHOLELITHIASIS 10/16/2006   Rheumatoid arthritis (HCC) 10/16/2006    Past Surgical History:  Procedure Laterality Date   ABDOMINAL HYSTERECTOMY     endometriomas b/l; total ? cervix removed; no h/o abnormal pap   Caesarean section     CARDIAC CATHETERIZATION N/A 05/08/2016   Procedure: Left Heart Cath and Cors/Grafts Angiography;  Surgeon: Kathleene Hazel, MD;  Location: [facility];  Service: Cardiovascular;  Laterality: N/A;   CARPAL TUNNEL RELEASE     b/l    CARPAL TUNNEL RELEASE Left 06/06/2021   Procedure: CARPAL TUNNEL RELEASE;  Surgeon: Cindee Salt, MD;  Location: [facility];  Service: Orthopedics;  Laterality: Left;  AXILLARY BLOCK   CATARACT EXTRACTION, BILATERAL     CHOLECYSTECTOMY     COLONOSCOPY WITH PROPOFOL N/A 03/25/2020   Procedure: COLONOSCOPY WITH PROPOFOL;  Surgeon: Wyline Mood, MD;  Location: [facility];  Service: Gastroenterology;  Laterality: N/A;   CORONARY ARTERY BYPASS GRAFT     ESOPHAGOGASTRODUODENOSCOPY (EGD) WITH PROPOFOL N/A 03/25/2020   Procedure: ESOPHAGOGASTRODUODENOSCOPY (EGD) WITH PROPOFOL;  Surgeon: Wyline Mood, MD;  Location: [facility];  Service: Gastroenterology;  Laterality: N/A;   EYE SURGERY     laser x 2 for retinopathy    LEFT HEART CATH AND CORS/GRAFTS ANGIOGRAPHY N/A 02/22/2020   Procedure: LEFT HEART CATH AND CORS/GRAFTS ANGIOGRAPHY;  Surgeon: Kathleene Hazel, MD;  Location: [facility];  Service: Cardiovascular;  Laterality: N/A;   TRIGGER FINGER RELEASE     b/l fingers all    ULNAR NERVE TRANSPOSITION Left 06/06/2021   Procedure: ULNAR NERVE DECOMPRESSION;  Surgeon: Cindee Salt, MD;  Location: [facility];  Service: Orthopedics;  Laterality: Left;  AXILLARY BLOCK   VITRECTOMY     b/l     OB History   No obstetric history on file.      Home Medications    Prior to Admission medications    Medication Sig Start Date End Date Taking? Authorizing Provider  metroNIDAZOLE (FLAGYL) 500 MG tablet Take 1 tablet (500 mg total) by mouth 2 (two) times daily for 7 days. 06/18/23 06/25/23 Yes Shirlee Latch, PA-C  albuterol (VENTOLIN HFA) 108 (90 Base) MCG/ACT inhaler Inhale 2 puffs into the lungs every 4 (four) hours as needed for wheezing or shortness of breath. 04/11/23   Excell Seltzer, MD  Ascorbic Acid 500 MG CAPS Take by mouth. 10/21/13   [provider]  aspirin EC 81 MG tablet Take 81 mg by mouth daily.    [provider]  Blood Glucose Monitoring Suppl (FREESTYLE FREEDOM LITE) w/Device KIT Use as directed by physician to check blood sugar 02/27/22     CHELATED MAGNESIUM PO Take 250 mg by mouth daily.    [provider]  clopidogrel (PLAVIX) 75 MG tablet TAKE 1 TABLET BY MOUTH ONCE DAILY 07/12/22   Tonny Bollman, MD  Continuous Blood Gluc Sensor (DEXCOM G6 SENSOR) MISC Dispense and use as directed.  Change sensor every 10 days. 01/30/22     Continuous Blood Gluc Transmit (DEXCOM G6 TRANSMITTER) MISC Dispense and use as directed.  Change transmitter every 90 days. 01/30/22     cyclobenzaprine (FLEXERIL) 10 MG tablet Take 1 tablet (10 mg total) by mouth at bedtime as needed for muscle spasms. 06/10/23   Gearldine Bienenstock, PA-C  DULoxetine (CYMBALTA) 20 MG capsule Take 1 capsule (20 mg total) by mouth at bedtime for 7 days, THEN 2 capsules (40 mg total) at bedtime for 7 days, THEN 3 capsules (60 mg total) at bedtime for chronic pain. 03/22/23 04/27/23  Lovorn, Aundra Millet, MD  EPINEPHrine 0.3 mg/0.3 mL IJ SOAJ injection Inject 0.3 mg into the muscle as needed for anaphylaxis. 11/14/21   McLean-Scocuzza, Pasty Spillers, MD  glucose blood (FREESTYLE LITE) test strip Use to check blood sugar 3 time(s) daily 02/27/22     insulin detemir (LEVEMIR) 100 UNIT/ML injection Inject 36 units into the skin once daily in the event of insulin pump failure. 07/09/22     insulin lispro (HUMALOG) 100  UNIT/ML injection Inject 0.9 mLs (90 Units total) into the skin daily in insulin pump. 12/17/22     Insulin Syringe-Needle U-100 (BD INSULIN SYRINGE U/F) 31G X 5/16" 0.3 ML MISC use with insulin injections 3 times daily in the event of insulin pump failure. 11/22/21     isosorbide mononitrate (IMDUR) 60 MG 24 hr tablet Take 1 tablet (60  mg total) by mouth daily. 03/11/23   Tonny Bollman, MD  ketoconazole (NIZORAL) 2 % shampoo Apply 1 Application topically 3 (three) times a week. Wash scalp 3 times weekly, leave in for 10 minutes then rinse out Patient not taking: Reported on 06/13/2023 06/27/22   Sandi Mealy, MD  Krill Oil 500 MG CAPS Take 500 mg by mouth daily.    [provider]  Lancets (FREESTYLE) lancets Use to check blood sugar 3 time(s) daily. 02/27/22     leflunomide (ARAVA) 20 MG tablet Take 1 tablet (20 mg total) by mouth daily. 03/11/23   Gearldine Bienenstock, PA-C  linaclotide [facility]) 290 MCG CAPS capsule Take 1 capsule (290 mcg total) by mouth daily before breakfast. 08/06/22   Wyline Mood, MD  losartan (COZAAR) 100 MG tablet Take 1 tablet (100 mg total) by mouth daily. 04/17/23   Swinyer, Zachary George, NP  metoprolol tartrate (LOPRESSOR) 50 MG tablet Take 0.5 tablets (25 mg total) by mouth 2 (two) times daily. 03/11/23 03/11/24  Swinyer, Zachary George, NP  mupirocin ointment (BACTROBAN) 2 % Apply 1 Application topically as directed. Bid to tid to open sores in scalp Patient not taking: Reported on 06/12/2023 06/27/22   Neale Burly, [place], MD  ondansetron (ZOFRAN) 4 MG tablet Take 1 tablet (4 mg total) by mouth every 6 (six) hours as needed for nausea or vomiting. 03/22/23   Lovorn, Aundra Millet, MD  ondansetron (ZOFRAN-ODT) 4 MG disintegrating tablet Take 4 mg by mouth every 8 (eight) hours as needed. 08/28/20   [provider]  pantoprazole (PROTONIX) 40 MG tablet Take 1 tablet (40 mg total) by mouth 2 (two) times daily. 10/15/22 10/15/23  Wyline Mood, MD  potassium chloride (KLOR-CON) 10 MEQ  tablet Take 1 tablet (10 mEq total) by mouth daily. 05/14/23   Swinyer, Zachary George, NP  promethazine (PHENERGAN) 25 MG tablet TAKE 1/2- 1 TABLETS (12.5-25 MG TOTAL) BY MOUTH 2 TIMES DAILY AS NEEDED FOR NAUSEA OR VOMITING. 03/22/21 12/10/22  McLean-Scocuzza, Pasty Spillers, MD  rosuvastatin (CRESTOR) 20 MG tablet Take 1 tablet (20 mg total) by mouth daily. 06/10/23   Tonny Bollman, MD  torsemide (DEMADEX) 20 MG tablet Take 1 tablet (20 mg total) by mouth daily. 03/11/23   Tonny Bollman, MD  Upadacitinib ER (RINVOQ) 15 MG TB24 Take 1 tablet (15 mg) by mouth daily. 05/29/23   Gearldine Bienenstock, PA-C    Family History Family History  Problem Relation Age of Onset   Depression Mother    Anxiety disorder Mother    Colon polyps Father    Diabetes Father        2   Hypertension Father    Parkinson's disease Father    Stroke Paternal Grandmother    Heart disease Other    Hypertension Other    Hyperlipidemia Other    Depression Other    Migraines Other    Heart attack Neg Hx    Colon cancer Neg Hx    Stomach cancer Neg Hx    Esophageal cancer Neg Hx     Social History Social History   Tobacco Use   Smoking status: Never    Passive exposure: Never   Smokeless tobacco: Never  Vaping Use   Vaping Use: Never used  Substance Use Topics   Alcohol use: Yes    Comment: rarely 1 every 6 months   Drug use: No     Allergies   Actemra [tocilizumab], Ramipril, Shellfish-derived products, Atorvastatin, Compazine  [prochlorperazine edisylate], Emgality [galcanezumab-gnlm], Etanercept,  Infliximab, Iohexol, Orencia [abatacept], Prochlorperazine edisylate, Tofacitinib, Trokendi xr [topiramate er], Tramadol, Amiodarone, and Rituximab   Review of Systems Review of Systems  Constitutional:  Negative for chills, fatigue and fever.  Gastrointestinal:  Negative for abdominal pain, diarrhea, nausea and vomiting.  Genitourinary:  Positive for frequency and urgency. Negative for decreased urine volume,  difficulty urinating, dysuria, flank pain, hematuria, pelvic pain, vaginal bleeding, vaginal discharge and vaginal pain.  Musculoskeletal:  Positive for back pain.  Skin:  Negative for rash.     Physical Exam Triage Vital Signs ED Triage Vitals  Enc Vitals Group     BP 06/18/23 1321 126/73     Pulse Rate 06/18/23 1321 72     Resp 06/18/23 1321 18     Temp 06/18/23 1321 98 F (36.7 C)     Temp src --      SpO2 --      Weight --      Height --      Head Circumference --      Peak Flow --      Pain Score 06/18/23 1317 6     Pain Loc --      Pain Edu? --      Excl. in GC? --    No data found.  Updated Vital Signs BP 126/73   Pulse 72   Temp 98 F (36.7 C)   Resp 18      Physical Exam Vitals and nursing note reviewed.  Constitutional:      General: She is not in acute distress.    Appearance: Normal appearance. She is not ill-appearing or toxic-appearing.  HENT:     Head: Normocephalic and atraumatic.  Eyes:     General: No scleral icterus.       Right eye: No discharge.        Left eye: No discharge.     Conjunctiva/sclera: Conjunctivae normal.  Cardiovascular:     Rate and Rhythm: Normal rate and regular rhythm.     Heart sounds: Normal heart sounds.  Pulmonary:     Effort: Pulmonary effort is normal. No respiratory distress.     Breath sounds: Normal breath sounds.  Abdominal:     Palpations: Abdomen is soft.     Tenderness: There is no abdominal tenderness. There is no right CVA tenderness or left CVA tenderness.  Musculoskeletal:     Cervical back: Neck supple.     Lumbar back: Tenderness (TTP  bilateral paralumbar muscles) present. No bony tenderness. Decreased range of motion. Negative right straight leg raise test and negative left straight leg raise test.  Skin:    General: Skin is dry.  Neurological:     General: No focal deficit present.     Mental Status: She is alert. Mental status is at baseline.     Motor: No weakness.     Gait: Gait normal.   Psychiatric:        Mood and Affect: Mood normal.        Behavior: Behavior normal.        Thought Content: Thought content normal.      UC Treatments / Results  Labs (all labs ordered are listed, but only abnormal results are displayed) Labs Reviewed  WET PREP, GENITAL - Abnormal; Notable for the following components:      Result Value   Clue Cells Wet Prep HPF POC PRESENT (*)    WBC, Wet Prep HPF POC <10 (*)    All other components  within normal limits  URINALYSIS, W/ REFLEX TO CULTURE (INFECTION SUSPECTED) - Abnormal; Notable for the following components:   Specific Gravity, Urine <1.005 (*)    Glucose, UA 100 (*)    Bacteria, UA RARE (*)    All other components within normal limits  GLUCOSE, CAPILLARY - Abnormal; Notable for the following components:   Glucose-Capillary 172 (*)    All other components within normal limits  URINE CULTURE  CBG MONITORING, ED    EKG   Radiology No results found.  Procedures Procedures (including critical care time)  Medications Ordered in UC Medications - No data to display  Initial Impression / Assessment and Plan / UC Course  I have reviewed the triage vital signs and the nursing notes.  Pertinent labs & imaging results that were available during my care of the patient were reviewed by me and considered in my medical decision making (see chart for details).   57 year old type I diabetic presents for increased urinary frequency for the past couple days.  Also reports nausea, lower back pain, urinary urgency.  Denies fever, fatigue, abdominal pain, vomiting, vaginal discharge or odor.  Reports that she had a corticosteroid injection into her hip yesterday.  Reports elevated blood sugars for the past several days.  Vitals are normal and stable.  She is overall well-appearing.  No acute distress.  On exam has no abdominal tenderness.  She does have tenderness to palpation of bilateral paralumbar muscles, more so on the right side.  No  CVA tenderness.  UA shows <1.005 specific gravity, glucose and rare bacteria.  Will send urine for culture and treat for UTI if positive.  Wet prep shows clue cells.  Will treat for BV with metronidazole.  Fingerstick glucose is 172.  Advised patient to continue insulin and she may need to adjust her dose accordingly.  If continued and difficult to control I will bated blood sugars advised to follow-up with endocrinologist or PCP.  Low back pain is likely musculoskeletal.  Supportive care advised.  Follow-up as needed.   Final Clinical Impressions(s) / UC Diagnoses   Final diagnoses:  Bacterial vaginosis  Urinary frequency  Type 1 diabetes mellitus with hyperglycemia (HCC)  Acute bilateral low back pain without sciatica     Discharge Instructions       -No sign of UTI but I am going to send it for culture and we will call you if you need additional antibiotics. - You do have bacterial vaginosis so I sent antibiotics to the pharmacy for that. - Your blood sugar is high which can lead to urinary frequency.  Continue taking your insulin as directed.  May need to adjust your dose. -Back pain is likely musculoskeletal.  Take Tylenol for pain relief.  Consider also anti-inflammatory medication, heat, rest, ice, muscle rubs and stretching.   The most common types of vaginal infections are yeast infections and bacterial vaginosis. Neither of which are really considered to be sexually transmitted. Often a pH swab or wet prep is performed and if abnormal may reveal either type of infection. Begin metronidazole if prescribed for possible BV infection. If there is concern for yeast infection, fluconazole is often prescribed . Take this as directed. You may also apply topical miconazole (can be purchased OTC) externally for relief of itching. Increase rest and fluid intake. If labs sent out, we will call within 2-5 days with results and amend treatment if necessary. Always try to use pH balanced  washes/wipes, urinate after intercourse, stay hydrated,  and take probiotics if you are prone to vaginal infections. Return or see PCP or gynecologist for new/worsening infections.       ED Prescriptions     Medication Sig Dispense Auth. Provider   metroNIDAZOLE (FLAGYL) 500 MG tablet Take 1 tablet (500 mg total) by mouth 2 (two) times daily for 7 days. 14 tablet Gareth Morgan      PDMP not reviewed this encounter.   Shirlee Latch, PA-C 06/18/23 [id number]

## 2023-06-18 NOTE — ED Triage Notes (Addendum)
Pt c/o of inability to empty bladder and urinary freq. And low back pain. Symptoms started today pt denies fever

## 2023-06-18 NOTE — Discharge Instructions (Addendum)
-  No sign of UTI but I am going to send it for culture and we will call you if you need additional antibiotics. - You do have bacterial vaginosis so I sent antibiotics to the pharmacy for that. - Your blood sugar is high which can lead to urinary frequency.  Continue taking your insulin as directed.  May need to adjust your dose. -Back pain is likely musculoskeletal.  Take Tylenol for pain relief.  Consider also anti-inflammatory medication, heat, rest, ice, muscle rubs and stretching.   The most common types of vaginal infections are yeast infections and bacterial vaginosis. Neither of which are really considered to be sexually transmitted. Often a pH swab or wet prep is performed and if abnormal may reveal either type of infection. Begin metronidazole if prescribed for possible BV infection. If there is concern for yeast infection, fluconazole is often prescribed . Take this as directed. You may also apply topical miconazole (can be purchased OTC) externally for relief of itching. Increase rest and fluid intake. If labs sent out, we will call within 2-5 days with results and amend treatment if necessary. Always try to use pH balanced washes/wipes, urinate after intercourse, stay hydrated, and take probiotics if you are prone to vaginal infections. Return or see PCP or gynecologist for new/worsening infections.

## 2023-06-19 LAB — URINE CULTURE: Culture: <10000 — AB

## 2023-06-20 ENCOUNTER — Other Ambulatory Visit: Payer: Self-pay

## 2023-06-21 ENCOUNTER — Other Ambulatory Visit: Payer: Self-pay

## 2023-06-21 MED ORDER — INSULIN LISPRO 100 UNIT/ML IJ SOLN
90.0000 [IU] | Freq: Every day | INTRAMUSCULAR | 5 refills | Status: DC
  Filled 2023-06-21: qty 90, 90d supply, fill #0
  Filled 2023-09-27: qty 90, 90d supply, fill #1

## 2023-06-24 ENCOUNTER — Other Ambulatory Visit: Payer: Self-pay | Admitting: Physician Assistant

## 2023-06-24 ENCOUNTER — Other Ambulatory Visit: Payer: Self-pay

## 2023-06-24 ENCOUNTER — Encounter: Payer: Commercial Managed Care - PPO | Admitting: Physical Therapy

## 2023-06-24 MED FILL — Leflunomide Tab 20 MG: ORAL | 90 days supply | Qty: 90 | Fill #0 | Status: AC

## 2023-06-24 NOTE — Telephone Encounter (Signed)
Last Fill: 03/11/2023  Labs: 06/12/2023 Sodium 134, Glucose 118, RBC 3.84, Hemoglobin 11.8, HCT 34.6   Next Visit: 12/10/2023  Last Visit: 06/13/2023  DX: Rheumatoid arthritis of multiple sites with negative rheumatoid factor   Current Dose per office note 06/13/2023 : Arava 20 mg 1 tablet daily   Okay to refill Arava ?

## 2023-06-24 NOTE — Telephone Encounter (Signed)
Last Fill: 03/11/2023  Labs: 06/12/2023 Sodium 134, Glucose 118, RBC 3.84, Hgb 11.8, Hct 34.6  Next Visit: 12/10/2023  Last Visit: 06/13/2023  DX: Rheumatoid arthritis of multiple sites with negative rheumatoid factor   Current Dose per office note 06/13/2023: Arava 20 mg 1 tablet by mouth daily.   Okay to refill Arava ?

## 2023-06-26 ENCOUNTER — Encounter: Payer: Commercial Managed Care - PPO | Admitting: Physical Therapy

## 2023-06-26 ENCOUNTER — Other Ambulatory Visit (HOSPITAL_COMMUNITY): Payer: Self-pay

## 2023-06-28 ENCOUNTER — Encounter: Payer: Self-pay | Admitting: Physical Medicine and Rehabilitation

## 2023-06-28 ENCOUNTER — Encounter
Payer: Commercial Managed Care - PPO | Attending: Physical Medicine and Rehabilitation | Admitting: Physical Medicine and Rehabilitation

## 2023-06-28 ENCOUNTER — Other Ambulatory Visit (HOSPITAL_COMMUNITY): Payer: Self-pay

## 2023-06-28 VITALS — BP 134/77 | HR 77 | Ht 63.0 in | Wt 176.0 lb

## 2023-06-28 DIAGNOSIS — G43009 Migraine without aura, not intractable, without status migrainosus: Secondary | ICD-10-CM | POA: Diagnosis not present

## 2023-06-28 DIAGNOSIS — M7918 Myalgia, other site: Secondary | ICD-10-CM

## 2023-06-28 MED ORDER — LIDOCAINE HCL 1 % IJ SOLN
6.0000 mL | Freq: Once | INTRAMUSCULAR | Status: AC
Start: 2023-06-28 — End: ?

## 2023-06-28 NOTE — Progress Notes (Signed)
Pt is a 57 yr old female with hx of RA, (-) rh factor, thoracic DDD, DM with retinopathy- Ac 6.5, migraines without aura, and MI/CABG in past-  Also has Graves disease; and just dx'd with tendinitis in B/L hamstring tendinosis- s/p recent R injection (due to DM only did one)- no CKD   Here for f/u of chronic pain.   Seeing Dr Shon Baton- for acoustic therapy- a little better.   Couldn't tolerate Cymbalta- out of it and appeared depressed per family/friends.  Even on 40 mg daily, so stopped it.    Got theracane Use that  3x/week- Helps through the night, then goes back to normal when works.    Works in [employer] at [employer]- does [employer] pre- post and procedure-  Is moving a lot.     Use patches- been slacking on using- has been using a lot of hair.    Plan: Will try Trigger point injections- if doesn't work, or not enough, then will add low dose Naltrexone.     2. Can message me- if want to start LDN/low dose naltrexone vs regular naltrexone.  - would try 2 mg at bedtime- then go to 4 mg at bedtime- or if cannot do compounded, will try 12. 5mg  of 50 mg Naltrexone by Rx.    3. Patient here for trigger point injections for  Consent done and on chart.  Cleaned areas with alcohol and injected using a 27 gauge 1.5 inch needle  Injected 3cc- none wasted Using 1% Lidocaine with no EPI  Upper traps- B/L  Levators- B/L  Posterior scalenes Middle scalenes- B/L  Splenius Capitus Pectoralis Major- B/L  Rhomboids- b?L  Infraspinatus Teres Major/minor Thoracic paraspinals Lumbar paraspinals Other injections-    Patient's level of pain prior was  6/10 Current level of pain after injections is-feels tighter- about the same  There was no bleeding or complications.  Patient was advised to drink a lot of water on day after injections to flush system Will have increased soreness for 12-48 hours after injections.  Can use Lidocaine patches the day AFTER injections Can use  theracane on day of injections in places didn't inject Can use heating pad 4-6 hours AFTER injections  4. F/U in 6 weeks-  Let me know in next 1-2 weeks how things went.   I spent a total of  23  minutes on total care today- >50% coordination of care- due to 10 minutes on injections-r est d/w pt about detailed above.

## 2023-06-28 NOTE — Patient Instructions (Signed)
Plan: Will try Trigger point injections- if doesn't work, or not enough, then will add low dose Naltrexone.     2. Can message me- if want to start LDN/low dose naltrexone vs regular naltrexone.  - would try 2 mg at bedtime- then go to 4 mg at bedtime- or if cannot do compounded, will try 12. 5mg  of 50 mg Naltrexone by Rx.    3. Patient here for trigger point injections for  Consent done and on chart.  Cleaned areas with alcohol and injected using a 27 gauge 1.5 inch needle  Injected 3cc- none wasted Using 1% Lidocaine with no EPI  Upper traps- B/L  Levators- B/L  Posterior scalenes Middle scalenes- B/L  Splenius Capitus Pectoralis Major- B/L  Rhomboids- b?L  Infraspinatus Teres Major/minor Thoracic paraspinals Lumbar paraspinals Other injections-    Patient's level of pain prior was  6/10 Current level of pain after injections is-feels tighter- about the same  There was no bleeding or complications.  Patient was advised to drink a lot of water on day after injections to flush system Will have increased soreness for 12-48 hours after injections.  Can use Lidocaine patches the day AFTER injections Can use theracane on day of injections in places didn't inject Can use heating pad 4-6 hours AFTER injections  4. F/U in 6 weeks-  Let me know in next 1-2 weeks how things went.

## 2023-07-01 ENCOUNTER — Encounter: Payer: Commercial Managed Care - PPO | Admitting: Physical Therapy

## 2023-07-01 ENCOUNTER — Other Ambulatory Visit (HOSPITAL_COMMUNITY): Payer: Self-pay

## 2023-07-03 ENCOUNTER — Encounter: Payer: Commercial Managed Care - PPO | Admitting: Physical Therapy

## 2023-07-08 ENCOUNTER — Encounter: Payer: Commercial Managed Care - PPO | Admitting: Physical Therapy

## 2023-07-10 ENCOUNTER — Encounter: Payer: Commercial Managed Care - PPO | Admitting: Physical Therapy

## 2023-07-11 ENCOUNTER — Other Ambulatory Visit: Payer: Self-pay

## 2023-07-11 ENCOUNTER — Other Ambulatory Visit: Payer: Self-pay | Admitting: Physician Assistant

## 2023-07-11 MED ORDER — CYCLOBENZAPRINE HCL 10 MG PO TABS
10.0000 mg | ORAL_TABLET | Freq: Every evening | ORAL | 2 refills | Status: DC | PRN
  Filled 2023-07-11: qty 30, 30d supply, fill #0
  Filled 2023-08-08: qty 30, 30d supply, fill #1
  Filled 2023-09-08: qty 30, 30d supply, fill #2

## 2023-07-11 NOTE — Telephone Encounter (Signed)
Last Fill: 06/10/2023  Next Visit: 12/10/2023  Last Visit: 06/13/2023  Dx: Other insomnia   Current Dose per office note on 06/13/2023: Flexeril 10 mg at bedtime for muscle spasms and insomnia.   Okay to refill Flexeril?

## 2023-07-15 ENCOUNTER — Encounter: Payer: Commercial Managed Care - PPO | Admitting: Physical Therapy

## 2023-07-16 ENCOUNTER — Encounter: Payer: Self-pay | Admitting: Nurse Practitioner

## 2023-07-16 ENCOUNTER — Other Ambulatory Visit: Payer: Self-pay | Admitting: Cardiovascular Disease

## 2023-07-16 ENCOUNTER — Ambulatory Visit (INDEPENDENT_AMBULATORY_CARE_PROVIDER_SITE_OTHER)
Admission: RE | Admit: 2023-07-16 | Discharge: 2023-07-16 | Disposition: A | Discharge status: Home / Self Care | Payer: Commercial Managed Care - PPO | Source: Ambulatory Visit | Attending: Nurse Practitioner | Admitting: Nurse Practitioner

## 2023-07-16 DIAGNOSIS — R051 Acute cough: Secondary | ICD-10-CM

## 2023-07-16 DIAGNOSIS — R059 Cough, unspecified: Secondary | ICD-10-CM | POA: Diagnosis not present

## 2023-07-16 DIAGNOSIS — Z951 Presence of aortocoronary bypass graft: Secondary | ICD-10-CM | POA: Diagnosis not present

## 2023-07-16 NOTE — Telephone Encounter (Signed)
I called the patient to notify her that a chest x-ray has been ordered, she is coming in today.

## 2023-07-16 NOTE — Telephone Encounter (Signed)
I will place the order for a chest xray if we can just get her scheduled

## 2023-07-16 NOTE — Telephone Encounter (Signed)
Do ou want [place] to reach out to patient to set up office visit?

## 2023-07-17 ENCOUNTER — Other Ambulatory Visit: Payer: Self-pay

## 2023-07-17 ENCOUNTER — Encounter: Payer: Commercial Managed Care - PPO | Admitting: Physical Therapy

## 2023-07-17 MED ORDER — CLOPIDOGREL BISULFATE 75 MG PO TABS
75.0000 mg | ORAL_TABLET | Freq: Every day | ORAL | 2 refills | Status: DC
  Filled 2023-07-17: qty 90, 90d supply, fill #0
  Filled 2023-10-10: qty 90, 90d supply, fill #1
  Filled 2024-01-08: qty 90, 90d supply, fill #2

## 2023-07-18 ENCOUNTER — Ambulatory Visit: Payer: Commercial Managed Care - PPO | Admitting: Sports Medicine

## 2023-07-18 ENCOUNTER — Encounter: Payer: Self-pay | Admitting: Sports Medicine

## 2023-07-18 DIAGNOSIS — M25552 Pain in left hip: Secondary | ICD-10-CM

## 2023-07-18 DIAGNOSIS — M25551 Pain in right hip: Secondary | ICD-10-CM

## 2023-07-18 DIAGNOSIS — G8929 Other chronic pain: Secondary | ICD-10-CM | POA: Diagnosis not present

## 2023-07-18 NOTE — Progress Notes (Signed)
Doing ok; pain has come back States her left one is now bothering her

## 2023-07-18 NOTE — Progress Notes (Signed)
[name] - 57 y.o. female MRN [id number]  Date of birth: [birth year]  Office Visit Note: Visit Date: 07/18/2023 PCP: Eden Emms, NP Referred by: Eden Emms, NP  Subjective: Chief Complaint  Patient presents with   Right Hip - Follow-up   HPI: [name] is a pleasant 57 y.o. female who presents today for follow-up of bilateral lateral hip pain.  We had previously been treating the right hip -she was receiving good results from extracorporeal shockwave therapy.  She did have a flareup of her pain and recently had an US-guided R-GT injection on 06/17/23.  This gave her near complete relief of her pain for the first few weeks and certainly help relieve her pain for the first month but here over the last few days to week and has started to bother her slightly again.  Continues with home rehab exercises and stretching which do help.  Her left hip has bothered her chronically in the past but has been slightly more bothersome as of late as well.  Hip pain is worse with prolonged standing.  Occasionally over-the-counter anti-inflammatories but does not take any medication on a consistent basis.  Pertinent ROS were reviewed with the patient and found to be negative unless otherwise specified above in HPI.   Assessment & Plan: Visit Diagnoses:  1. Greater trochanteric pain syndrome of right lower extremity   2. Greater trochanteric pain syndrome of left lower extremity   3. Chronic hip pain, bilateral    Plan: [name] is dealing with acute on chronic bilateral greater trochanteric pain of both hips.  She has made good strides and has full strength now of both hips for hip abduction.  She still is experiencing pain over bilateral hips, likely worsening from her prolonged standing from her job.  She has received good relief of extracorporeal shockwave therapy for the right hip, through shared-decision making did proceed with bilateral ESWT for each lateral hip and gluteus medius/minimus  tendons.  Discussed the role for injection therapy including corticosteroids, but more specifically PRP injection therapy for more prolonged relief.  She is somewhat interested in this treatment option and would like to think about this and let me know if she would like to proceed at a later time.  In the interim, we will see her back over the next 1-2 weeks for additional shockwave treatments and other evaluation.  Follow-up: Return for 1-2 weeks for b/l lateral hips.   Meds & Orders: No orders of the defined types were placed in this encounter.  No orders of the defined types were placed in this encounter.    Procedures: Procedure: ECSWT Indications:  Ischial Bursitis/Piriformis syndrome   Procedure Details Consent: Risks of procedure as well as the alternatives and risks of each were explained to the patient.  Verbal consent for procedure obtained. Time Out: Verified patient identification, verified procedure, site was marked, verified correct patient position. The area was cleaned with alcohol swab.     The right gluteus med/min was targeted for Extracorporeal shockwave therapy.    Preset: Trochanteric bursitis Power Level: 120 mJ Frequency: 14 Hz Impulse/cycles: 3800 (2000 and 1800) Head size: Regular   Patient tolerated procedure well without immediate complications.     The left  gluteus med/min was targeted for Extracorporeal shockwave therapy.    Preset: Trochanteric bursitis Power Level: 120 mJ Frequency: 14 Hz Impulse/cycles: 3800 (2000 and 1800) Head size: Regular   Patient tolerated procedure well without immediate complications.   Clinical  History: No specialty comments available.  She reports that she has never smoked. She has never been exposed to tobacco smoke. She has never used smokeless tobacco. No results for input(s): "HGBA1C", "LABURIC" in the last 8760 hours.  Objective:   Vital Signs: There were no vitals taken for this visit.  Physical Exam  Gen:  Well-appearing, in no acute distress; non-toxic CV: Well-perfused. Warm.  Resp: Breathing unlabored on room air; no wheezing. Psych: Fluid speech in conversation; appropriate affect; normal thought process Neuro: Sensation intact throughout. No gross coordination deficits.   Ortho Exam - Bilateral hips: + TTP over bilateral greater trochanteric region, no redness swelling or effusion.  There is some mild restriction with external logroll of the right hip compared to the left.  Equivocal FADIR testing.  There is pain with resisted hip abduction but there is 5/5 strength of bilateral hips in all directions.  No weakness with hip abduction compared to previous visits.  Imaging:  Right hip x-ray from 10/04/2022: AP pelvis lateral view of the right hip: Bilateral hips well located.  No  acute fractures or bony abnormalities.  Right hip joints well-maintained.   Narrative & Impression  CLINICAL DATA:  Right hip pain for 6 months.  No known injury.   EXAM: MR OF THE RIGHT HIP WITHOUT CONTRAST   TECHNIQUE: Multiplanar, multisequence MR imaging was performed. No intravenous contrast was administered.   COMPARISON:  None Available.   FINDINGS: Bones: No fracture, stress change or worrisome lesion is identified. No subchondral cyst formation or edema about the hips. No avascular necrosis of the femoral heads.   Articular cartilage and labrum   Articular cartilage:  Minimally degenerated.   Labrum: The superior labrum appears mildly frayed but no tear is identified.   Joint or bursal effusion   Joint effusion:  None.   Bursae: Negative.   Muscles and tendons   Muscles and tendons: Intact. Mildly to moderately increased T2 signal at the hamstring origins bilaterally is compatible with tendinosis and worse on the left.   Other findings   Miscellaneous:   The patient is status post hysterectomy.   IMPRESSION: 1. Mild appearing right hip osteoarthritis. 2. Mild to moderate  appearing bilateral hamstring origin tendinosis without tear appears worse on the left.     Electronically Signed   By: Drusilla Kanner M.D.   On: 03/13/2023 11:55    Past Medical/Family/Surgical/Social History: Medications & Allergies reviewed per EMR, new medications updated. Patient Active Problem List   Diagnosis Date Noted   Lower extremity edema 06/12/2023   Myofascial pain dysfunction syndrome 03/22/2023   Left foot pain 12/05/2022   Urinary urgency 10/23/2022   Pyelonephritis 10/23/2022   Premature atrial contractions 05/15/2022   PVC's (premature ventricular contractions) 05/15/2022   Low hemoglobin 01/16/2022   Otalgia of right ear 01/09/2022   Sore throat 01/09/2022   OSA on CPAP 08/02/2021   Abnormal MRI, lumbar spine 03/20/2021   Abnormal MRI, thoracic spine 03/20/2021   Chronic mid back pain 03/17/2021   Chronic midline low back pain with left-sided sciatica 03/17/2021   Type 1 diabetes mellitus with diabetic polyneuropathy (HCC) 02/17/2021   Severe obstructive sleep apnea-hypopnea syndrome 02/17/2021   Chronic diastolic CHF (congestive heart failure) (HCC) 12/19/2020   Chronic back pain 12/19/2020   Chronic abdominal pain 12/19/2020   Acute diastolic heart failure (HCC) 12/09/2020   Diabetic retinopathy associated with type 1 diabetes mellitus (HCC) 09/27/2020   Diabetic retinopathy (HCC) 09/27/2020   Graves' disease 09/27/2020  Chronic coronary artery disease 09/27/2020   Hypertension associated with diabetes (HCC) 09/14/2020   Obesity (BMI 30-39.9) 09/14/2020   Preventative health care 09/14/2020   Aortic atherosclerosis (HCC) 09/14/2020   Snoring 05/12/2020   Lung nodule 03/10/2020   Colitis 03/10/2020   Type 1 diabetes mellitus with proliferative retinopathy (HCC) 12/10/2019   Abnormal thyroid function test 12/10/2019   Bruxism (teeth grinding)    Migraine 10/07/2019   Contusion of left hip 07/25/2019   H/O multiple allergies 03/10/2019   Hx of  anaphylaxis 03/10/2019   Cough 06/26/2018   PND (post-nasal drip) 06/26/2018   Lumbar strain, initial encounter 04/10/2018   Thoracic myofascial strain 04/10/2018   Vitamin D deficiency 07/04/2017   B12 deficiency 07/04/2017   Abnormal CT scan 06/19/2017   Gastroesophageal reflux disease 06/19/2017   Antiplatelet or antithrombotic long-term use 06/19/2017   Nasal congestion 03/15/2017   Graves disease 02/01/2017   Sleep difficulties 02/01/2017   No energy 02/01/2017   Osteopenia 02/01/2017   Bilateral hand pain 07/16/2016   Acquired trigger finger 07/16/2016   Hypertension, essential 05/08/2016   Foot pain, right 08/30/2014   Shoulder pain, right 08/30/2014   Chronic pain syndrome 01/15/2014   Multiple joint pain 01/15/2014   Lesion of lower eyelid 04/21/2013   Generalized constriction of visual field 03/31/2013   Neoplasm of uncertain behavior of skin of eyelid 03/31/2013   Posterior capsular opacification 03/31/2013   Cyst of left lower eyelid 03/30/2013   Pseudophakia of both eyes 03/30/2013   Prurigo nodularis 02/05/2013   Stasis dermatitis 02/05/2013   Psoriasis 01/21/2013   Trochanteric bursitis 11/10/2012   Achilles tendinitis 07/11/2012   Epiretinal membrane 06/17/2012   Status post cataract extraction 04/30/2012   Pes equinus, acquired 04/23/2012   Tendinitis of ankle 04/23/2012   Proliferative diabetic retinopathy of both eyes (HCC) 01/11/2012   Ongoing use of possibly toxic medication 12/05/2011   Hyperthyroidism 11/09/2010   SINUS TACHYCARDIA 11/08/2010   DERMATITIS, ALLERGIC 07/20/2010   EDEMA 07/12/2010   Dizziness 11/14/2009   ATRIAL FIBRILLATION 07/20/2009   Hx of CABG 06/07/2009   ANGINA, STABLE/EXERTIONAL 06/02/2009   Dyslipidemia, goal LDL below 70 04/26/2009   ACUT MI ANTEROLAT WALL SUBSQT EPIS CARE 04/26/2009   Chest pain 04/26/2009   DIABETIC  RETINOPATHY 04/25/2009   CARPAL TUNNEL SYNDROME, BILATERAL 04/25/2009   TRIGGER FINGER 04/25/2009    MIGRAINE W/O AURA W/INTRACT W/STATUS MIGRAINOSUS 02/19/2008   ALLERGIC RHINITIS, SEASONAL 10/16/2006   CHOLELITHIASIS 10/16/2006   Rheumatoid arthritis (HCC) 10/16/2006   Past Medical History:  Diagnosis Date   ACUT MI ANTEROLAT WALL SUBSQT EPIS CARE    Acute maxillary sinusitis    ALLERGIC RHINITIS, SEASONAL    Anemia    ARTHRITIS, RHEUMATOID    shoulders and hands Enbrel>leg swelling Dr. Corliss Skains   Atrial fibrillation [facility])    a. after CABG.   Back pain    Bruxism (teeth grinding)    CAD, ARTERY BYPASS GRAFT    a. DES to RCA in 2010 then LAD occlusion s/p CABG 3 06/07/2009 with LIMA to LAD, reverse SVG to D1, reverse SVG to distal RCA. b. Cath 05/08/2016 slightly hypodense region in the intermediate branch, however she had excellent flow, FFR was normal. Vein graft to PDA and the posterolateral branch is patent, patent LIMA to LAD, occluded SVG to diagonal.   CARPAL TUNNEL SYNDROME, BILATERAL    CHOLELITHIASIS    Contrast media allergy    DERMATITIS, ALLERGIC    DIABETES MELLITUS, TYPE I  on insulin pump dx'ed age 15 y.o    DIABETIC  RETINOPATHY    Hiatal hernia    HYPERLIPIDEMIA-MIXED    HYPERTHYROIDISM    Dr. Osborne Casco   IDA (iron deficiency anemia)    Insulin pump in place    Lymphadenopathy of head and neck    MIGRAINE W/O AURA W/INTRACT W/STATUS MIGRAINOSUS 02/19/2008   PONV (postoperative nausea and vomiting)    Psoriasis    SINUS TACHYCARDIA 11/08/2010   Sleep apnea    not using machine yet    SVT (supraventricular tachycardia)    after s/p CABG   Tendonitis    TRIGGER FINGER    all fingers b/l hands    URI    Family History  Problem Relation Age of Onset   Depression Mother    Anxiety disorder Mother    Colon polyps Father    Diabetes Father        2   Hypertension Father    Parkinson's disease Father    Stroke Paternal Grandmother    Heart disease Other    Hypertension Other    Hyperlipidemia Other    Depression Other    Migraines Other     Heart attack Neg Hx    Colon cancer Neg Hx    Stomach cancer Neg Hx    Esophageal cancer Neg Hx    Past Surgical History:  Procedure Laterality Date   ABDOMINAL HYSTERECTOMY     endometriomas b/l; total ? cervix removed; no h/o abnormal pap   Caesarean section     CARDIAC CATHETERIZATION N/A 05/08/2016   Procedure: Left Heart Cath and Cors/Grafts Angiography;  Surgeon: Kathleene Hazel, MD;  Location: [facility];  Service: Cardiovascular;  Laterality: N/A;   CARPAL TUNNEL RELEASE     b/l    CARPAL TUNNEL RELEASE Left 06/06/2021   Procedure: CARPAL TUNNEL RELEASE;  Surgeon: Cindee Salt, MD;  Location: [facility];  Service: Orthopedics;  Laterality: Left;  AXILLARY BLOCK   CATARACT EXTRACTION, BILATERAL     CHOLECYSTECTOMY     COLONOSCOPY WITH PROPOFOL N/A 03/25/2020   Procedure: COLONOSCOPY WITH PROPOFOL;  Surgeon: Wyline Mood, MD;  Location[facility] END[facility];  Service: Gastroenterology;  Laterality: N/A;   CORONARY ARTERY BYPASS GRAFT     ESOPHAGOGASTRODUODENOSCOPY (EGD) WITH PROPOFOL N/A 03/25/2020   Procedure: ESOPHAGOGASTRODUODENOSCOPY (EGD) WITH PROPOFOL;  Surgeon: Wyline Mood, MD;  Location: [facility] ENDOSCOPY;  Service: Gastroenterology;  Laterality: N/A;   EYE SURGERY     laser x 2 for retinopathy    LEFT HEART CATH AND CORS/GRAFTS ANGIOGRAPHY N/A 02/22/2020   Procedure: LEFT HEART CATH AND CORS/GRAFTS ANGIOGRAPHY;  Surgeon: Kathleene Hazel, MD;  Location: [facility];  Service: Cardiovascular;  Laterality: N/A;   TRIGGER FINGER RELEASE     b/l fingers all    ULNAR NERVE TRANSPOSITION Left 06/06/2021   Procedure: ULNAR NERVE DECOMPRESSION;  Surgeon: Cindee Salt, MD;  Location: [facility];  Service: Orthopedics;  Laterality: Left;  AXILLARY BLOCK   VITRECTOMY     b/l    Social History   Occupational History   Not on file  Tobacco Use   Smoking status: Never    Passive exposure: Never   Smokeless tobacco: Never  Vaping Use    Vaping status: Never Used  Substance and Sexual Activity   Alcohol use: Yes    Comment: rarely 1 every 6 months   Drug use: No   Sexual activity: Not Currently  Partners: Male    Birth control/protection: None   I spent 36 minutes in the care of the patient today including face-to-face time, preparation to see the patient, as well as review of previous hip x-rays, hip MRI from 03/11/2023, discussion on home rehab exercises, extracorporeal shockwave treatments, PRP protocol and injection options for the above diagnoses.   Madelyn Brunner, DO Primary Care Sports Medicine Physician  [facility]  This note was dictated using Dragon naturally speaking software and may contain errors in syntax, spelling, or content which have not been identified prior to signing this note.

## 2023-07-22 ENCOUNTER — Encounter: Payer: Commercial Managed Care - PPO | Admitting: Physical Therapy

## 2023-07-22 ENCOUNTER — Other Ambulatory Visit: Payer: Self-pay

## 2023-07-24 ENCOUNTER — Other Ambulatory Visit: Payer: Self-pay

## 2023-07-24 ENCOUNTER — Encounter: Payer: Commercial Managed Care - PPO | Admitting: Physical Therapy

## 2023-07-26 ENCOUNTER — Other Ambulatory Visit (HOSPITAL_COMMUNITY): Payer: Self-pay

## 2023-07-26 ENCOUNTER — Other Ambulatory Visit: Payer: Self-pay

## 2023-07-29 ENCOUNTER — Encounter: Payer: Commercial Managed Care - PPO | Admitting: Physical Therapy

## 2023-07-29 DIAGNOSIS — E10319 Type 1 diabetes mellitus with unspecified diabetic retinopathy without macular edema: Secondary | ICD-10-CM | POA: Diagnosis not present

## 2023-07-30 ENCOUNTER — Other Ambulatory Visit: Payer: Self-pay

## 2023-07-31 ENCOUNTER — Encounter: Payer: Commercial Managed Care - PPO | Admitting: Physical Therapy

## 2023-08-02 ENCOUNTER — Ambulatory Visit: Payer: Commercial Managed Care - PPO | Admitting: Sports Medicine

## 2023-08-02 ENCOUNTER — Encounter: Payer: Self-pay | Admitting: Sports Medicine

## 2023-08-02 DIAGNOSIS — M25551 Pain in right hip: Secondary | ICD-10-CM | POA: Diagnosis not present

## 2023-08-02 DIAGNOSIS — G8929 Other chronic pain: Secondary | ICD-10-CM | POA: Diagnosis not present

## 2023-08-02 DIAGNOSIS — M25552 Pain in left hip: Secondary | ICD-10-CM

## 2023-08-02 NOTE — Progress Notes (Signed)
[name] - 57 y.o. female MRN [id number]  Date of birth: Sep 26, 1966  Office Visit Note: Visit Date: 08/02/2023 PCP: Eden Emms, NP Referred by: Eden Emms, NP  Subjective: Chief Complaint  Patient presents with   Right Hip - Follow-up   Left Hip - Follow-up   HPI: [name] is a pleasant 57 y.o. female who presents today for follow-up of bilateral lateral hip pain.   She did have a flareup of her pain and recently had an US-guided R-GT injection on 06/17/23. This gave her near complete relief of her pain for the first few weeks and certainly help relieve her pain for the first month.  Shonita has had quite a few sessions of extracorporeal shockwave therapy which is still given her good relief from prior to any treatment.  However she is still not 100% better and with prolonged standing, other activity her pain will bother her again.  She is interested in proceeding with PRP injection therapy, would like to do the right hip before the left hip.  Here recently she has had a mild flare of her rheumatoid arthritis and her elbows have been bothering her, has only taken 10 mg prednisone the last 2 days.  Pertinent ROS were reviewed with the patient and found to be negative unless otherwise specified above in HPI.   Assessment & Plan: Visit Diagnoses:  1. Greater trochanteric pain syndrome of right lower extremity   2. Greater trochanteric pain syndrome of left lower extremity   3. Chronic hip pain, bilateral    Plan: Kambry is dealing with acute on chronic bilateral greater trochanteric pain syndrome.  Did review her MRI of the right hip, there is some mild tendinopathy and small amount of edema with the gluteal insertion but I do not appreciate any high-grade tearing.  We did repeat extracorporeal shockwave treatment for both hips today as she has been finding benefit.  She is looking for further beneficial treatment, she would like to proceed with PRP injection therapy.  We  will start with the right hip and then move to the left hip about 10 days after.  We did discuss the overview of the injection today.  She will need to wait 2 weeks from her last dose of prednisone 10 mg prior to the injection.  She will schedule this at her leisure.  Okay for her to continue her home strengthening exercises until PRP. Stop all NSAID's 10 days prior.  Follow-up: Return for f/u for PRP injection for R-lateral hip (30-min).   Meds & Orders: No orders of the defined types were placed in this encounter.  No orders of the defined types were placed in this encounter.    Procedures: Procedure: ECSWT Indications:  Ischial Bursitis/Piriformis syndrome   Procedure Details Consent: Risks of procedure as well as the alternatives and risks of each were explained to the patient.  Verbal consent for procedure obtained. Time Out: Verified patient identification, verified procedure, site was marked, verified correct patient position. The area was cleaned with alcohol swab.     The right greater trochanteric area was targeted for Extracorporeal shockwave therapy.    Preset: Trochanteric bursitis Power Level: 120 mJ Frequency: 14 Hz Impulse/cycles: 3600 Head size: Regular   Patient tolerated procedure well without immediate complications.      The left greater trochanteric area was targeted for Extracorporeal shockwave therapy.    Preset: Trochanteric bursitis Power Level: 120 mJ Frequency: 14 Hz Impulse/cycles: 3600 Head size: Regular  Patient tolerated procedure well without immediate complications.       Clinical History: No specialty comments available.  She reports that she has never smoked. She has never been exposed to tobacco smoke. She has never used smokeless tobacco. No results for input(s): "HGBA1C", "LABURIC" in the last 8760 hours.  Objective:   Vital Signs: There were no vitals taken for this visit.  Physical Exam  Gen: Well-appearing, in no acute distress;  non-toxic CV:  Well-perfused. Warm.  Resp: Breathing unlabored on room air; no wheezing. Psych: Fluid speech in conversation; appropriate affect; normal thought process Neuro: Sensation intact throughout. No gross coordination deficits.   Ortho Exam - Bilateral hips: + TTP over greater anterior region, right greater than left.  No significant swelling or effusion.  Improved strength with resisted hip abduction.  Imaging: No results found.  Past Medical/Family/Surgical/Social History: Medications & Allergies reviewed per EMR, new medications updated. Patient Active Problem List   Diagnosis Date Noted   Lower extremity edema 06/12/2023   Myofascial pain dysfunction syndrome 03/22/2023   Left foot pain 12/05/2022   Urinary urgency 10/23/2022   Pyelonephritis 10/23/2022   Premature atrial contractions 05/15/2022   PVC's (premature ventricular contractions) 05/15/2022   Low hemoglobin 01/16/2022   Otalgia of right ear 01/09/2022   Sore throat 01/09/2022   OSA on CPAP 08/02/2021   Abnormal MRI, lumbar spine 03/20/2021   Abnormal MRI, thoracic spine 03/20/2021   Chronic mid back pain 03/17/2021   Chronic midline low back pain with left-sided sciatica 03/17/2021   Type 1 diabetes mellitus with diabetic polyneuropathy (HCC) 02/17/2021   Severe obstructive sleep apnea-hypopnea syndrome 02/17/2021   Chronic diastolic CHF (congestive heart failure) (HCC) 12/19/2020   Chronic back pain 12/19/2020   Chronic abdominal pain 12/19/2020   Acute diastolic heart failure (HCC) 12/09/2020   Diabetic retinopathy associated with type 1 diabetes mellitus (HCC) 09/27/2020   Diabetic retinopathy (HCC) 09/27/2020   Graves' disease 09/27/2020   Chronic coronary artery disease 09/27/2020   Hypertension associated with diabetes (HCC) 09/14/2020   Obesity (BMI 30-39.9) 09/14/2020   Preventative health care 09/14/2020   Aortic atherosclerosis (HCC) 09/14/2020   Snoring 05/12/2020   Lung nodule  03/10/2020   Colitis 03/10/2020   Type 1 diabetes mellitus with proliferative retinopathy (HCC) 12/10/2019   Abnormal thyroid function test 12/10/2019   Bruxism (teeth grinding)    Migraine 10/07/2019   Contusion of left hip 07/25/2019   H/O multiple allergies 03/10/2019   Hx of anaphylaxis 03/10/2019   Cough 06/26/2018   PND (post-nasal drip) 06/26/2018   Lumbar strain, initial encounter 04/10/2018   Thoracic myofascial strain 04/10/2018   Vitamin D deficiency 07/04/2017   B12 deficiency 07/04/2017   Abnormal CT scan 06/19/2017   Gastroesophageal reflux disease 06/19/2017   Antiplatelet or antithrombotic long-term use 06/19/2017   Nasal congestion 03/15/2017   Graves disease 02/01/2017   Sleep difficulties 02/01/2017   No energy 02/01/2017   Osteopenia 02/01/2017   Bilateral hand pain 07/16/2016   Acquired trigger finger 07/16/2016   Hypertension, essential 05/08/2016   Foot pain, right 08/30/2014   Shoulder pain, right 08/30/2014   Chronic pain syndrome 01/15/2014   Multiple joint pain 01/15/2014   Lesion of lower eyelid 04/21/2013   Generalized constriction of visual field 03/31/2013   Neoplasm of uncertain behavior of skin of eyelid 03/31/2013   Posterior capsular opacification 03/31/2013   Cyst of left lower eyelid 03/30/2013   Pseudophakia of both eyes 03/30/2013   Prurigo nodularis 02/05/2013  Stasis dermatitis 02/05/2013   Psoriasis 01/21/2013   Trochanteric bursitis 11/10/2012   Achilles tendinitis 07/11/2012   Epiretinal membrane 06/17/2012   Status post cataract extraction 04/30/2012   Pes equinus, acquired 04/23/2012   Tendinitis of ankle 04/23/2012   Proliferative diabetic retinopathy of both eyes (HCC) 01/11/2012   Ongoing use of possibly toxic medication 12/05/2011   Hyperthyroidism 11/09/2010   SINUS TACHYCARDIA 11/08/2010   DERMATITIS, ALLERGIC 07/20/2010   EDEMA 07/12/2010   Dizziness 11/14/2009   ATRIAL FIBRILLATION 07/20/2009   Hx of CABG  06/07/2009   ANGINA, STABLE/EXERTIONAL 06/02/2009   Dyslipidemia, goal LDL below 70 04/26/2009   ACUT MI ANTEROLAT WALL SUBSQT EPIS CARE 04/26/2009   Chest pain 04/26/2009   DIABETIC  RETINOPATHY 04/25/2009   CARPAL TUNNEL SYNDROME, BILATERAL 04/25/2009   TRIGGER FINGER 04/25/2009   MIGRAINE W/O AURA W/INTRACT W/STATUS MIGRAINOSUS 02/19/2008   ALLERGIC RHINITIS, SEASONAL 10/16/2006   CHOLELITHIASIS 10/16/2006   Rheumatoid arthritis (HCC) 10/16/2006   Past Medical History:  Diagnosis Date   ACUT MI ANTEROLAT WALL SUBSQT EPIS CARE    Acute maxillary sinusitis    ALLERGIC RHINITIS, SEASONAL    Anemia    ARTHRITIS, RHEUMATOID    shoulders and hands Enbrel>leg swelling Dr. Corliss Skains   Atrial fibrillation [facility])    a. after CABG.   Back pain    Bruxism (teeth grinding)    CAD, ARTERY BYPASS GRAFT    a. DES to RCA in 2010 then LAD occlusion s/p CABG 3 06/07/2009 with LIMA to LAD, reverse SVG to D1, reverse SVG to distal RCA. b. Cath 05/08/2016 slightly hypodense region in the intermediate branch, however she had excellent flow, FFR was normal. Vein graft to PDA and the posterolateral branch is patent, patent LIMA to LAD, occluded SVG to diagonal.   CARPAL TUNNEL SYNDROME, BILATERAL    CHOLELITHIASIS    Contrast media allergy    DERMATITIS, ALLERGIC    DIABETES MELLITUS, TYPE I    on insulin pump dx'ed age 28 y.o    DIABETIC  RETINOPATHY    Hiatal hernia    HYPERLIPIDEMIA-MIXED    HYPERTHYROIDISM    Dr. Osborne Casco   IDA (iron deficiency anemia)    Insulin pump in place    Lymphadenopathy of head and neck    MIGRAINE W/O AURA W/INTRACT W/STATUS MIGRAINOSUS 02/19/2008   PONV (postoperative nausea and vomiting)    Psoriasis    SINUS TACHYCARDIA 11/08/2010   Sleep apnea    not using machine yet    SVT (supraventricular tachycardia)    after s/p CABG   Tendonitis    TRIGGER FINGER    all fingers b/l hands    URI    Family History  Problem Relation Age of Onset   Depression  Mother    Anxiety disorder Mother    Colon polyps Father    Diabetes Father        2   Hypertension Father    Parkinson's disease Father    Stroke Paternal Grandmother    Heart disease Other    Hypertension Other    Hyperlipidemia Other    Depression Other    Migraines Other    Heart attack Neg Hx    Colon cancer Neg Hx    Stomach cancer Neg Hx    Esophageal cancer Neg Hx    Past Surgical History:  Procedure Laterality Date   ABDOMINAL HYSTERECTOMY     endometriomas b/l; total ? cervix removed; no h/o abnormal pap  Caesarean section     CARDIAC CATHETERIZATION N/A 05/08/2016   Procedure: Left Heart Cath and Cors/Grafts Angiography;  Surgeon: Kathleene Hazel, MD;  Location: [facility] INVASIVE CV LAB;  Service: Cardiovascular;  Laterality: N/A;   CARPAL TUNNEL RELEASE     b/l    CARPAL TUNNEL RELEASE Left 06/06/2021   Procedure: CARPAL TUNNEL RELEASE;  Surgeon: Cindee Salt, MD;  Location: [facility];  Service: Orthopedics;  Laterality: Left;  AXILLARY BLOCK   CATARACT EXTRACTION, BILATERAL     CHOLECYSTECTOMY     COLONOSCOPY WITH PROPOFOL N/A 03/25/2020   Procedure: COLONOSCOPY WITH PROPOFOL;  Surgeon: Wyline Mood, MD;  Location: [facility] ENDOSCOPY;  Service: Gastroenterology;  Laterality: N/A;   CORONARY ARTERY BYPASS GRAFT     ESOPHAGOGASTRODUODENOSCOPY (EGD) WITH PROPOFOL N/A 03/25/2020   Procedure: ESOPHAGOGASTRODUODENOSCOPY (EGD) WITH PROPOFOL;  Surgeon: Wyline Mood, MD;  Location: [facility] ENDOSCOPY;  Service: Gastroenterology;  Laterality: N/A;   EYE SURGERY     laser x 2 for retinopathy    LEFT HEART CATH AND CORS/GRAFTS ANGIOGRAPHY N/A 02/22/2020   Procedure: LEFT HEART CATH AND CORS/GRAFTS ANGIOGRAPHY;  Surgeon: Kathleene Hazel, MD;  Location: [facility] INVASIVE CV LAB;  Service: Cardiovascular;  Laterality: N/A;   TRIGGER FINGER RELEASE     b/l fingers all    ULNAR NERVE TRANSPOSITION Left 06/06/2021   Procedure: ULNAR NERVE DECOMPRESSION;  Surgeon: Cindee Salt,  MD;  Location: [facility];  Service: Orthopedics;  Laterality: Left;  AXILLARY BLOCK   VITRECTOMY     b/l    Social History   Occupational History   Not on file  Tobacco Use   Smoking status: Never    Passive exposure: Never   Smokeless tobacco: Never  Vaping Use   Vaping status: Never Used  Substance and Sexual Activity   Alcohol use: Yes    Comment: rarely 1 every 6 months   Drug use: No   Sexual activity: Not Currently    Partners: Male    Birth control/protection: None

## 2023-08-02 NOTE — Patient Instructions (Signed)
Dr. Shon Baton' Instructions and What to Expect for PRP Injections:  Platelet-rich plasma is used in musculoskeletal medicine to focus your own body's ability to heal. It has several well-done published research trials (RCT) which demonstrate both its effectiveness and safety in many musculoskeletal conditions, including osteoarthritis, tendinopathies, partial tendon tears, and damaged vertebral discs. PRP has been in clinical use since the 1990's. Many people know that platelets form a clot if there is a cut in the skin. It turns out that platelets do not only form a clot, but they also start the body's own repair process. When platelets activate to form a clot, they release alpha granules which have hundreds of chemical messengers in them that initiate and organize repair to the damaged tissue. Precisely placing PRP into the site of injury will initiate the healing process by activating on the damaged cartilage, bone or tendon. This is an inflammatory process, and inflammation is the vital first phase of healing.  What to expect and how to prepare for PRP:   Depending on the procedure, you may need to arrange for a driver to bring you home (i.e. procedure on right/driving leg). IF you are having a lower extremity procedure, we can provide crutches as needed, but these are not routinely needed.   10 days prior to the procedure: Stop taking anti-inflammatory drugs like Ibuprofen/Motrin, Advil, Naprosyn, Celebrex, or Meloxicam. Even aspirin should be stopped (but need to discuss this with Dr. Shon Baton and your cardiologist beforehand). Let Dr. Shon Baton know if you have been taking prednisone or other corticosteroids in the last 30 days (2 weeks is ok given you 10mg  use only for a few days) as this can negatively interfere with the PRP process.   The day before the procedure: thoroughly shower and clean your skin.    The day of the procedure: Wear loose-fitting clothing like sweatpants or shorts. If you are  having an upper body procedure wear a top that can button or zip up. Please show up at least 15 minutes early for your appointment, as we will need to take you back to draw your blood prior to the procedure.    Things to avoid prior to procedure: tobacco/nicotine, alcohol, fatty foods. Tobacco is a potent toxin and its use constricts small blood vessels which are needed for tissue repair. Tobacco/nicotine use will limit the effectiveness of any treatment and stopping tobacco use is one of the single greatest actions you can take to improve your health. Avoid toxins like alcohol, which inhibits and depresses the cells needed for tissue repair.  PRP will initiate healing and a productive inflammation, and PRP therapy will make the body part treated sore for the first 3-4 days up to two weeks. Anti-inflammatory drugs (i.e. ibuprofen, Naprosyn, Celebrex) and corticosteroids such as prednisone can blunt or stop this process, so it is important to not take any anti-inflammatory drugs for 10 days before getting PRP therapy, or for at least two weeks after PRP therapy.   Depending on the body part injected, you may be in a sling or on crutches for several days. Most of the time these are not needed, but it is important to allow time for the body part to rest after the injection. By keeping the body part treated relaxed and not loading the body part the PRP can bind in place and do its job. If you push the body part too early, this may make the PRP less effective.  What happens during the PRP procedure?  Platelet  rich plasma is made by taking some of your blood and performing a two-stage centrifuge process on it to concentrate the PRP. First, your blood is drawn into a syringe with a small amount of anti-coagulant in it (this is to keep the blood from clotting during this process). The amount of blood drawn is usually about 10-30 milliliters, depending on how much PRP is needed for the treatment. Then the blood is  transferred in a sterile fashion into a centrifuge tube. It is then centrifuged for the first cycle where the red blood cells are isolated and discarded. In the second centrifuge cycle, the platelet-rich fraction of the remaining plasma is concentrated and placed in a syringe. The skin at the injection site is numbed with a small amount of topical cooling spray. Dr. Shon Baton will then precisely inject the PRP into the injury site using ultrasound guidance.  What to do after your procedure:   NO anti-inflammatories (Ibuprofen/Motrin, Aleve, Meloxicam, etc.) for 2 weeks after injection. It is OK to use Tylenol only if needed. I can also prescribe you specific medicine to control any discomfort you may have after the procedure if needed.   NO applying ice to the affected area for 2 weeks after the injection. It is OK to use heat.   Rest the affected body part. By keeping the body part treated relaxed and not loading the body part the PRP can bind in place and do its job. Be sure to ask Dr. Shon Baton specific post-injection rest and guidelines to follow following your injection, as these will differ slightly depending on what type of PRP injection you receive. In general:   Allow 3-4 days of rest from physical labor or repetitive activity for the affected body part. It is ok to move the area, but no lifting or strenuous activity. After 3 days, unless otherwise instructed, the treated body part should be used and slowly moved through its full range of motion. It will be sore, but you will not damage it by moving it, in fact it needs to move to heal.   No strenuous activity, lifting weights, or dedicated therapy until the 2-week mark from the injection. After 2 weeks from the injection, this is when it is most advantageous to start physical therapy.   After two weeks, you may begin returning to activity, but it is a good idea to avoid activities that specifically hurt you before being treated. Exercise is vital  to good health and finding a way to cross train around your injury is important not only for your physical health, but your mental health as well. Ask me about cross training options for your injury. Some brief (10 minutes or less) period of heat therapy will not hurt the rehab, but it is not required. Usually, depending on the initial injury, physical therapy is started from two weeks to three weeks after injection. Improvements in pain and function should be expected from 6 weeks to 12 weeks after injection and some injuries may require more than one treatment.

## 2023-08-05 ENCOUNTER — Encounter: Payer: Commercial Managed Care - PPO | Admitting: Physical Therapy

## 2023-08-05 DIAGNOSIS — E10319 Type 1 diabetes mellitus with unspecified diabetic retinopathy without macular edema: Secondary | ICD-10-CM | POA: Diagnosis not present

## 2023-08-07 ENCOUNTER — Encounter: Payer: Commercial Managed Care - PPO | Admitting: Physical Therapy

## 2023-08-08 ENCOUNTER — Encounter: Payer: Self-pay | Admitting: Cardiovascular Disease

## 2023-08-12 ENCOUNTER — Encounter: Payer: Commercial Managed Care - PPO | Admitting: Physical Therapy

## 2023-08-12 ENCOUNTER — Ambulatory Visit: Payer: Commercial Managed Care - PPO | Admitting: Sports Medicine

## 2023-08-14 ENCOUNTER — Encounter: Payer: Commercial Managed Care - PPO | Admitting: Physical Therapy

## 2023-08-15 ENCOUNTER — Other Ambulatory Visit: Payer: Self-pay | Admitting: Gastroenterology

## 2023-08-16 ENCOUNTER — Other Ambulatory Visit: Payer: Self-pay | Admitting: Gastroenterology

## 2023-08-16 ENCOUNTER — Other Ambulatory Visit: Payer: Self-pay

## 2023-08-19 ENCOUNTER — Encounter: Payer: Commercial Managed Care - PPO | Admitting: Physical Therapy

## 2023-08-19 ENCOUNTER — Other Ambulatory Visit: Payer: Self-pay

## 2023-08-19 MED FILL — Linaclotide Cap 290 MCG: ORAL | 90 days supply | Qty: 90 | Fill #0 | Status: AC

## 2023-08-20 ENCOUNTER — Other Ambulatory Visit (HOSPITAL_COMMUNITY): Payer: Self-pay

## 2023-08-21 ENCOUNTER — Other Ambulatory Visit: Payer: Self-pay

## 2023-08-21 ENCOUNTER — Encounter: Payer: Self-pay | Admitting: Cardiovascular Disease

## 2023-08-21 ENCOUNTER — Encounter: Payer: Commercial Managed Care - PPO | Admitting: Physical Therapy

## 2023-08-21 ENCOUNTER — Ambulatory Visit: Payer: Commercial Managed Care - PPO | Admitting: Cardiovascular Disease

## 2023-08-21 VITALS — BP 110/72 | HR 88 | Ht 63.0 in | Wt 181.8 lb

## 2023-08-21 DIAGNOSIS — E782 Mixed hyperlipidemia: Secondary | ICD-10-CM

## 2023-08-21 DIAGNOSIS — I2510 Atherosclerotic heart disease of native coronary artery without angina pectoris: Secondary | ICD-10-CM | POA: Diagnosis not present

## 2023-08-21 DIAGNOSIS — I10 Essential (primary) hypertension: Secondary | ICD-10-CM

## 2023-08-21 DIAGNOSIS — R0602 Shortness of breath: Secondary | ICD-10-CM | POA: Diagnosis not present

## 2023-08-21 MED ORDER — POTASSIUM CHLORIDE CRYS ER 20 MEQ PO TBCR
20.0000 meq | EXTENDED_RELEASE_TABLET | Freq: Every day | ORAL | 3 refills | Status: DC
  Filled 2023-08-21: qty 90, 90d supply, fill #0
  Filled 2023-12-04 – 2023-12-20 (×2): qty 90, 90d supply, fill #1
  Filled 2024-07-02 – 2024-07-15 (×2): qty 90, 90d supply, fill #2

## 2023-08-21 NOTE — Patient Instructions (Signed)
Medication Instructions:  INCREASE Potassium Chloride to daily *If you need a refill on your cardiac medications before your next appointment, please call your pharmacy*   Lab Work: NONE If you have labs (blood work) drawn today and your tests are completely normal, you will receive your results only by: MyChart Message (if you have MyChart) OR A paper copy in the mail If you have any lab test that is abnormal or we need to change your treatment, we will call you to review the results.   Testing/Procedures: ECHO Your physician has requested that you have an echocardiogram. Echocardiography is a painless test that uses sound waves to create images of your heart. It provides your doctor with information about the size and shape of your heart and how well your heart's chambers and valves are working. This procedure takes approximately one hour. There are no restrictions for this procedure. Please do NOT wear cologne, perfume, aftershave, or lotions (deodorant is allowed). Please arrive 15 minutes prior to your appointment time.  Follow-Up: At [facility], you and your health needs are our priority.  As part of our continuing mission to provide you with exceptional heart care, we have created designated Provider Care Teams.  These Care Teams include your primary Cardiologist (physician) and Advanced Practice Providers (APPs -  Physician Assistants and Nurse Practitioners) who all work together to provide you with the care you need, when you need it.  Your next appointment:   1 year(s)  Provider:   Tonny Bollman, MD

## 2023-08-21 NOTE — Progress Notes (Signed)
Cardiology Office Note:    Date:  08/21/2023   ID:  [name], [name] May 30, 1966, MRN [id number]  PCP:  Eden Emms, NP   [facility] Providers Cardiologist:  Tonny Bollman, MD     Referring MD: Eden Emms, NP   Chief Complaint  Patient presents with   Edema    History of Present Illness:    [name] is a 57 y.o. female with a hx of CAD s/p CABG x 3, type 1 diabetes, HTN, Graves' disease, HLD, rheumatoid arthritis, GERD, and chronic anemia.    She initially presented with an inferior wall MI treated with PCI of the right coronary artery. Ultimately was treated with multivessel CABG in 2010 in the setting of chronically occluded LAD.  The patient is here alone today.  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  Her daughters have graduated from college and they are both now 56 years old.  One of her daughters is engaged.  She works at  [employer] and her other daughter is working at [employer], Chiropractor school in the future.  The patient complains of fatigue, mild exertional dyspnea, and chronic leg edema.  These are all chronic complaints for her none of them of change significantly in the past year.  She denies any chest pain or pressure, orthopnea, or PND.  She continues to take torsemide at a dose of 20 mg daily.  She used to take K-Dur 20 mill equivalents daily, but has only been taking 10 mill equivalents of late because of a dosing change.  She request to go back on the 20 mEq dose.  Past Medical History:  Diagnosis Date   ACUT MI ANTEROLAT WALL SUBSQT EPIS CARE    Acute maxillary sinusitis    ALLERGIC RHINITIS, SEASONAL    Anemia    ARTHRITIS, RHEUMATOID    shoulders and hands Enbrel>leg swelling Dr. Corliss Skains   Atrial fibrillation [facility])    a. after CABG.   Back pain    Bruxism (teeth grinding)    CAD, ARTERY BYPASS GRAFT    a. DES to RCA in 2010 then LAD occlusion s/p CABG 3 06/07/2009 with LIMA to LAD, reverse SVG to  D1, reverse SVG to distal RCA. b. Cath 05/08/2016 slightly hypodense region in the intermediate branch, however she had excellent flow, FFR was normal. Vein graft to PDA and the posterolateral branch is patent, patent LIMA to LAD, occluded SVG to diagonal.   CARPAL TUNNEL SYNDROME, BILATERAL    CHOLELITHIASIS    Contrast media allergy    DERMATITIS, ALLERGIC    DIABETES MELLITUS, TYPE I    on insulin pump dx'ed age 57 y.o    DIABETIC  RETINOPATHY    Hiatal hernia    HYPERLIPIDEMIA-MIXED    HYPERTHYROIDISM    Dr. Osborne Casco   IDA (iron deficiency anemia)    Insulin pump in place    Lymphadenopathy of head and neck    MIGRAINE W/O AURA W/INTRACT W/STATUS MIGRAINOSUS 02/19/2008   PONV (postoperative nausea and vomiting)    Psoriasis    SINUS TACHYCARDIA 11/08/2010   Sleep apnea    not using machine yet    SVT (supraventricular tachycardia)    after s/p CABG   Tendonitis    TRIGGER FINGER    all fingers b/l hands    URI     Past Surgical History:  Procedure Laterality Date   ABDOMINAL HYSTERECTOMY     endometriomas b/l; total ?  cervix removed; no h/o abnormal pap   Caesarean section     CARDIAC CATHETERIZATION N/A 05/08/2016   Procedure: Left Heart Cath and Cors/Grafts Angiography;  Surgeon: Kathleene Hazel, MD;  Location: [facility] INVASIVE CV LAB;  Service: Cardiovascular;  Laterality: N/A;   CARPAL TUNNEL RELEASE     b/l    CARPAL TUNNEL RELEASE Left 06/06/2021   Procedure: CARPAL TUNNEL RELEASE;  Surgeon: Cindee Salt, MD;  Location: [facility];  Service: Orthopedics;  Laterality: Left;  AXILLARY BLOCK   CATARACT EXTRACTION, BILATERAL     CHOLECYSTECTOMY     COLONOSCOPY WITH PROPOFOL N/A 03/25/2020   Procedure: COLONOSCOPY WITH PROPOFOL;  Surgeon: Wyline Mood, MD;  Location: [facility] ENDOSCOPY;  Service: Gastroenterology;  Laterality: N/A;   CORONARY ARTERY BYPASS GRAFT     ESOPHAGOGASTRODUODENOSCOPY (EGD) WITH PROPOFOL N/A 03/25/2020   Procedure:  ESOPHAGOGASTRODUODENOSCOPY (EGD) WITH PROPOFOL;  Surgeon: Wyline Mood, MD;  Location: [facility]COPY;  Service: Gastroenterology;  Laterality: N/A;   EYE SURGERY     laser x 2 for retinopathy    LEFT HEART CATH AND CORS/GRAFTS ANGIOGRAPHY N/A 02/22/2020   Procedure: LEFT HEART CATH AND CORS/GRAFTS ANGIOGRAPHY;  Surgeon: Kathleene Hazel, MD;  Location: [facility] INVASIVE CV LAB;  Service: Cardiovascular;  Laterality: N/A;   TRIGGER FINGER RELEASE     b/l fingers all    ULNAR NERVE TRANSPOSITION Left 06/06/2021   Procedure: ULNAR NERVE DECOMPRESSION;  Surgeon: Cindee Salt, MD;  Location: [facility];  Service: Orthopedics;  Laterality: Left;  AXILLARY BLOCK   VITRECTOMY     b/l     Current Medications: Current Meds  Medication Sig   albuterol (VENTOLIN HFA) 108 (90 Base) MCG/ACT inhaler Inhale 2 puffs into the lungs every 4 (four) hours as needed for wheezing or shortness of breath.   Ascorbic Acid 500 MG CAPS Take by mouth.   aspirin EC 81 MG tablet Take 81 mg by mouth daily.   Blood Glucose Monitoring Suppl (FREESTYLE FREEDOM LITE) w/Device KIT Use as directed by physician to check blood sugar   CHELATED MAGNESIUM PO Take 250 mg by mouth daily.   clopidogrel (PLAVIX) 75 MG tablet TAKE 1 TABLET BY MOUTH ONCE DAILY   Continuous Blood Gluc Sensor (DEXCOM G6 SENSOR) MISC Dispense and use as directed.  Change sensor every 10 days.   Continuous Blood Gluc Transmit (DEXCOM G6 TRANSMITTER) MISC Dispense and use as directed.  Change transmitter every 90 days.   cyclobenzaprine (FLEXERIL) 10 MG tablet Take 1 tablet (10 mg total) by mouth at bedtime as needed for muscle spasms.   EPINEPHrine 0.3 mg/0.3 mL IJ SOAJ injection Inject 0.3 mg into the muscle as needed for anaphylaxis.   glucose blood (FREESTYLE LITE) test strip Use to check blood sugar 3 time(s) daily   insulin detemir (LEVEMIR) 100 UNIT/ML injection Inject 36 units into the skin once daily in the event of insulin pump failure.    insulin lispro (HUMALOG) 100 UNIT/ML injection Inject 0.9 mLs (90 Units total) into the skin daily in insulin pump..   Insulin Syringe-Needle U-100 (BD INSULIN SYRINGE U/F) 31G X 5/16" 0.3 ML MISC use with insulin injections 3 times daily in the event of insulin pump failure.   isosorbide mononitrate (IMDUR) 60 MG 24 hr tablet Take 1 tablet (60 mg total) by mouth daily.   ketoconazole (NIZORAL) 2 % shampoo Apply 1 Application topically 3 (three) times a week. Wash scalp 3 times weekly, leave in for 10 minutes then rinse  out   [place] 500 MG CAPS Take 500 mg by mouth daily.   Lancets (FREESTYLE) lancets Use to check blood sugar 3 time(s) daily.   leflunomide (ARAVA) 20 MG tablet Take 1 tablet (20 mg total) by mouth daily.   linaclotide (LINZESS) 290 MCG CAPS capsule Take 1 capsule (290 mcg total) by mouth daily before breakfast.   losartan (COZAAR) 100 MG tablet Take 1 tablet (100 mg total) by mouth daily.   metoprolol tartrate (LOPRESSOR) 50 MG tablet Take 0.5 tablets (25 mg total) by mouth 2 (two) times daily.   mupirocin ointment (BACTROBAN) 2 % Apply 1 Application topically as directed. Bid to tid to open sores in scalp   ondansetron (ZOFRAN) 4 MG tablet Take 1 tablet (4 mg total) by mouth every 6 (six) hours as needed for nausea or vomiting.   pantoprazole (PROTONIX) 40 MG tablet Take 1 tablet (40 mg total) by mouth 2 (two) times daily.   potassium chloride SA (KLOR-CON M20) 20 MEQ tablet Take 1 tablet (20 mEq total) by mouth daily.   rosuvastatin (CRESTOR) 20 MG tablet Take 1 tablet (20 mg total) by mouth daily.   torsemide (DEMADEX) 20 MG tablet Take 1 tablet (20 mg total) by mouth daily.   Upadacitinib ER (RINVOQ) 15 MG TB24 Take 1 tablet (15 mg) by mouth daily.   [DISCONTINUED] potassium chloride (KLOR-CON) 10 MEQ tablet Take 1 tablet (10 mEq total) by mouth daily.   Current Facility-Administered Medications for the 08/21/23 encounter (Office Visit) with Tonny Bollman, MD   Medication   lidocaine (XYLOCAINE) 1 % (with pres) injection 6 mL     Allergies:   Actemra [tocilizumab], Ramipril, Shellfish-derived products, Atorvastatin, Compazine  [prochlorperazine edisylate], Emgality [galcanezumab-gnlm], Etanercept, Infliximab, Iohexol, Orencia [abatacept], Prochlorperazine edisylate, Tofacitinib, Trokendi xr [topiramate er], Tramadol, Amiodarone, and Rituximab   Social History   Socioeconomic History   Marital status: Divorced    Spouse name: Not on file   Number of children: Not on file   Years of education: Not on file   Highest education level: Not on file  Occupational History   Not on file  Tobacco Use   Smoking status: Never    Passive exposure: Never   Smokeless tobacco: Never  Vaping Use   Vaping status: Never Used  Substance and Sexual Activity   Alcohol use: Yes    Comment: rarely 1 every 6 months   Drug use: No   Sexual activity: Not Currently    Partners: Male    Birth control/protection: None  Other Topics Concern   Not on file  Social History Narrative   Divorced. Has 2 kids(fraternal twins) daughters age 8. Works at  [employer], Never smoked, denies ETOH, no drugs. Drinks diet coke. No exercise.       DPR daughters Efraim Kaufmann and Mckenzey Strawderman twins          Social Determinants of Health   Financial Resource Strain: Not on file  Food Insecurity: No Food Insecurity (05/22/2022)   Received from [facility], [facility]   Hunger Vital Sign    Worried About Running Out of Food in the Last Year: Never true    Ran Out of Food in the Last Year: Never true  Transportation Needs: Not on file  Physical Activity: Not on file  Stress: Not on file  Social Connections: Unknown (05/09/2022)   [facility], [facility]   Social Network    Social Network: Not on file  Family History: The patient's family history includes Anxiety disorder in her mother; Colon polyps in her father; Depression in her mother and  another family member; Diabetes in her father; Heart disease in an other family member; Hyperlipidemia in an other family member; Hypertension in her father and another family member; Migraines in an other family member; Parkinson's disease in her father; Stroke in her paternal grandmother. There is no history of Heart attack, Colon cancer, Stomach cancer, or Esophageal cancer.  ROS:   Please see the history of present illness.    All other systems reviewed and are negative.  EKGs/Labs/Other Studies Reviewed:    The following studies were reviewed today: Echo 01/26/2021: IMPRESSIONS     1. Left ventricular ejection fraction, by estimation, is 60 to 65%. The  left ventricle has normal function. The left ventricle has no regional  wall motion abnormalities. Left ventricular diastolic parameters were  normal.   2. Right ventricular systolic function is normal. The right ventricular  size is normal.   3. The mitral valve is normal in structure. No evidence of mitral valve  regurgitation. No evidence of mitral stenosis.   4. The aortic valve was not well visualized. Aortic valve regurgitation  is not visualized.   5. The inferior vena cava is normal in size with greater than 50%  respiratory variability, suggesting right atrial pressure of 3 mmHg.        Recent Labs: 06/12/2023: ALT 24; BUN 15; Creatinine, Ser 0.92; Hemoglobin 11.8; Platelets 231.0; Potassium 3.9; Pro B Natriuretic peptide (BNP) 8.0; Sodium 134; TSH 1.34  Recent Lipid Panel    Component Value Date/Time   CHOL 133 12/05/2022 1558   TRIG 95.0 12/05/2022 1558   HDL 62.40 12/05/2022 1558   CHOLHDL 2 12/05/2022 1558   VLDL 19.0 12/05/2022 1558   LDLCALC 52 12/05/2022 1558     Risk Assessment/Calculations:                Physical Exam:    VS:  BP 110/72   Pulse 88   Ht 5\' 3"  (1.6 m)   Wt 181 lb 12.8 oz (82.5 kg)   SpO2 97%   BMI 32.20 kg/m     Wt Readings from Last 3 Encounters:  08/21/23 181 lb 12.8 oz  (82.5 kg)  06/28/23 176 lb (79.8 kg)  06/17/23 176 lb (79.8 kg)     GEN:  Well nourished, well developed in no acute distress HEENT: Normal NECK: No JVD; No carotid bruits LYMPHATICS: No lymphadenopathy CARDIAC: RRR, no murmurs, rubs, gallops RESPIRATORY:  Clear to auscultation without rales, wheezing or rhonchi  ABDOMEN: Soft, non-tender, non-distended MUSCULOSKELETAL:  Trace left pretibial edema; No deformity  SKIN: Warm and dry NEUROLOGIC:  Alert and oriented x 3 PSYCHIATRIC:  Normal affect   ASSESSMENT:    1. Coronary artery disease involving native coronary artery of native heart without angina pectoris   2. Shortness of breath   3. Mixed hyperlipidemia   4. Essential hypertension    PLAN:    In order of problems listed above:  The patient remains clinically stable with her history of premature multivessel CAD.  She is on long-term DAPT with aspirin and clopidogrel, antianginal therapy with isosorbide and metoprolol, and also treated with losartan in the setting of diabetes.  She is treated with high intensity statin drug as well. Suspect multifactorial.  Normal cardiac and pulmonary exam today. Recent chest x-ray is reviewed and shows no active cardiopulmonary disease.  Recommend check 2D echocardiogram  to evaluate LV systolic and diastolic function.  Last echo from January 2022 personally reviewed and showed no significant abnormalities.  She will continue on her current diuretic therapy. Treated with rosuvastatin 20 mg daily.  Lipids with a cholesterol of 133, HDL 62, LDL 52. Blood pressure is well-controlled on a combination of losartan, metoprolol, and isosorbide.     Medication Adjustments/Labs and Tests Ordered: Current medicines are reviewed at length with the patient today.  Concerns regarding medicines are outlined above.  Orders Placed This Encounter  Procedures   ECHOCARDIOGRAM COMPLETE   Meds ordered this encounter  Medications   potassium chloride SA  (KLOR-CON M20) 20 MEQ tablet    Sig: Take 1 tablet (20 mEq total) by mouth daily.    Dispense:  90 tablet    Refill:  3    Dose increase    Patient Instructions  Medication Instructions:  INCREASE Potassium Chloride to daily *If you need a refill on your cardiac medications before your next appointment, please call your pharmacy*   Lab Work: NONE If you have labs (blood work) drawn today and your tests are completely normal, you will receive your results only by: MyChart Message (if you have MyChart) OR A paper copy in the mail If you have any lab test that is abnormal or we need to change your treatment, we will call you to review the results.   Testing/Procedures: ECHO Your physician has requested that you have an echocardiogram. Echocardiography is a painless test that uses sound waves to create images of your heart. It provides your doctor with information about the size and shape of your heart and how well your heart's chambers and valves are working. This procedure takes approximately one hour. There are no restrictions for this procedure. Please do NOT wear cologne, perfume, aftershave, or lotions (deodorant is allowed). Please arrive 15 minutes prior to your appointment time.  Follow-Up: At [facility], you and your health needs are our priority.  As part of our continuing mission to provide you with exceptional heart care, we have created designated Provider Care Teams.  These Care Teams include your primary Cardiologist (physician) and Advanced Practice Providers (APPs -  Physician Assistants and Nurse Practitioners) who all work together to provide you with the care you need, when you need it.  Your next appointment:   1 year(s)  Provider:   Tonny Bollman, MD        Signed, Tonny Bollman, MD  08/21/2023 5:20 PM    [facility]

## 2023-08-23 ENCOUNTER — Encounter: Payer: Self-pay | Admitting: Sports Medicine

## 2023-08-23 ENCOUNTER — Ambulatory Visit: Payer: Self-pay | Admitting: Sports Medicine

## 2023-08-23 ENCOUNTER — Other Ambulatory Visit (HOSPITAL_COMMUNITY): Payer: Self-pay

## 2023-08-23 ENCOUNTER — Other Ambulatory Visit: Payer: Self-pay

## 2023-08-23 DIAGNOSIS — S76011S Strain of muscle, fascia and tendon of right hip, sequela: Secondary | ICD-10-CM

## 2023-08-23 DIAGNOSIS — M25551 Pain in right hip: Secondary | ICD-10-CM

## 2023-08-23 NOTE — Progress Notes (Signed)
   Procedure Note  Patient: [name]             Date of Birth: 11-30-1966           MRN: [id number]             Visit Date: 08/23/2023  Procedures: Visit Diagnoses:  1. Greater trochanteric pain syndrome of right lower extremity   2. Tear of right gluteus minimus tendon, sequela    US-Guided Greater Trochanteric and Gluteus medius & minimus tendon Injection, Right hip After discussion on risks/benefits/indications and informed verbal consent was obtained, a timeout was performed. The patient was lying in lateral recumbent position on exam table with affected hip facing upward. Using ultrasound guidance, the greater trochanter was identified as well as the gluteus medius and minimus tendons and their bony insertion. The area overlying the lateral hip was then prepped with Chloraprep and alcohol swabs. The overlying soft tissue only was anesthesized with 2 cc of lidocaine 1%. Following sterile precautions, ultrasound was reapplied to visualize needle guidance with a 22-gauge 3.5" needle utilizing an in-plane approach to inject 7 cc of platelet-rich-plasma (leukocyte-rich) into the greater trochanteric bursa and the tendon origin of both the gluteus minimus and gluteus minimus. Multiple needle fenestrations were performed into each tendon overlying the greater trochanter with appropriate dynamic visualization of the spread of PRP injectate. Patient tolerated procedure well without immediate complications. Band-aid was applied.    Kit: RegenLab: RegenPlasma; THT-3 kit   *Post-PRP Injection Guidelines: No anti-inflammatories (ibuprofen/motrin, aleve, meloxicam, etc.) for 2 weeks.  No ice for 2 weeks.  Short prescription of tramadol may be written if pain is severe, call if needed. Appropriate rest / bracing / and timeframe for beginning therapy discussed and patient endorsed understanding.   Madelyn Brunner, DO Primary Care Sports Medicine Physician  [facility]  This  note was dictated using Dragon naturally speaking software and may contain errors in syntax, spelling, or content which have not been identified prior to signing this note.

## 2023-08-23 NOTE — Patient Instructions (Signed)
*  Post-PRP Injection Guidelines:   NO anti-inflammatories (Ibuprofen/Motrin, Aleve, Meloxicam, etc.) for 2 weeks after injection. It is OK to use Tylenol only if needed. Please call the clinic if you need additional pain medicine in the short-term.   NO applying ice to the affected area for 2 weeks after the injection. It is OK to use heat.   Rest the affected body part. By keeping the body part treated relaxed and not loading the body part the PRP can bind in place and do its job. Be sure to ask Dr. Shon Baton specific post-injection rest and guidelines to follow following your injection, as these will differ slightly depending on what type of PRP injection you receive. In general:   Allow 3-4 days of rest from physical labor or repetitive activity for the affected body part. It is ok to move the area, but no lifting or strenuous activity. After 3 days, unless otherwise instructed, the treated body part should be used and slowly moved through its full range of motion. It will be sore, but you will not damage it by moving it, in fact it needs to move to heal.   No strenuous activity, lifting weights, or dedicated therapy until the 2-week mark from the injection. After 2 weeks from the injection, this is when it is most advantageous to start physical therapy.   After two weeks, you may begin returning to activity, but it is a good idea to avoid activities that specifically hurt you before being treated. Ask me about cross training options for your injury.   Depending on the initial injury, usually physical therapy is started about 2 weeks after injection. PRP takes time to work as it facilitates the healing response of your own body, there is not immediate improvement in pain or function. Improvements in pain and function should be expected about 6 weeks to 12 weeks after injection (but will continue to work past this timeframe as well) and some injuries may require more than one PRP treatment.

## 2023-08-24 ENCOUNTER — Ambulatory Visit
Admission: EM | Admit: 2023-08-24 | Discharge: 2023-08-24 | Disposition: A | Discharge status: Home / Self Care | Payer: Commercial Managed Care - PPO | Attending: Emergency Medicine | Admitting: Emergency Medicine

## 2023-08-24 DIAGNOSIS — U071 COVID-19: Secondary | ICD-10-CM

## 2023-08-24 LAB — SARS CORONAVIRUS 2 BY RT PCR: SARS Coronavirus 2 by RT PCR: POSITIVE — AB

## 2023-08-24 MED ORDER — IPRATROPIUM BROMIDE 0.06 % NA SOLN: 2.0000 | Freq: Four times a day (QID) | NASAL | 12 refills | Status: DC

## 2023-08-24 MED ORDER — MOLNUPIRAVIR EUA 200MG CAPSULE: 4.0000 | ORAL_CAPSULE | Freq: Two times a day (BID) | ORAL | 0 refills | Status: AC

## 2023-08-24 MED ORDER — BENZONATATE 100 MG PO CAPS: 200.0000 mg | ORAL_CAPSULE | Freq: Three times a day (TID) | ORAL | 0 refills | Status: DC

## 2023-08-24 MED ORDER — ALBUTEROL SULFATE HFA 108 (90 BASE) MCG/ACT IN AERS: 2.0000 | INHALATION_SPRAY | RESPIRATORY_TRACT | 0 refills | Status: DC | PRN

## 2023-08-24 MED ORDER — PROMETHAZINE-DM 6.25-15 MG/5ML PO SYRP: 5.0000 mL | ORAL_SOLUTION | Freq: Four times a day (QID) | ORAL | 0 refills | Status: DC | PRN

## 2023-08-24 NOTE — Discharge Instructions (Addendum)
CDC guidelines state that you must wear a mask for the first 5 days of symptoms when you are around other people.  After 5 days you no longer need to mask as you are no longer considered infectious.  There is no longer need to quarantine unless you have a fever.  If you do have a fever then you need to quarantine until you have been fever free for 24 hours without taking Tylenol and/or ibuprofen.  Use over-the-counter Tylenol and/or ibuprofen according to the package instructions as needed for fever and pain.  Use the Atrovent nasal spray, 2 squirts up each nostril every 6 hours, as needed for nasal congestion and runny nose.  Use the Tessalon Perles every 8 hours during the day as needed for cough.  Take them with a small sip of water.  You may experience numbness to the base of your tongue or metallic taste in her mouth, this is normal.  Use the Promethazine DM cough syrup at bedtime as needed for cough and congestion.  Be mindful this medication will make you sleepy.  Take the molnupiravir twice daily for 5 days for treatment of COVID-19.  I have also sent in the refill for your albuterol inhaler should you need it.  You can use 2 puffs every 4-6 hours as needed for any shortness of breath or wheezing.  If you develop any worsening respiratory symptoms such as shortness of breath, shortness of breath at rest, feel as though you cannot catch your breath, you are unable to speak in full sentences, or, as a late sign, your lips begin turning blue you need to call 911 and go to the ER for evaluation.

## 2023-08-24 NOTE — ED Triage Notes (Signed)
Patient states that she's had an on going cough for a while. Her fever headache and dizziness started yesterday. Patient had PRP yesterday.

## 2023-08-24 NOTE — ED Provider Notes (Signed)
[facility]    CSN: [id number] Arrival date & time: 08/24/23  0809      History   Chief Complaint Chief Complaint  Patient presents with   Cough   Fever   Headache   Dizziness    HPI [name] is a 57 y.o. female.   HPI  56 year old female with a past medical history significant for diabetes, diabetic retinopathy, CAD with CABG, acute MI, SVT, atrial fibrillation, hypothyroidism, rheumatoid arthritis, and migraine headaches presents for evaluation of respiratory symptoms.  She reports that she has been experiencing a cough for several months that is productive for clear sputum.  There is occasional wheezing.  She has been referred to cardiology due to the ongoing cough as well as swelling in her legs and is set to have an echo in the coming weeks.  She came in for evaluation today because she developed a fever with a Tmax of 100.7, runny nose nasal congestion, dizziness, and nausea yesterday.  She denies any sore throat or ear pain, shortness of breath, vomiting or diarrhea.  She is unaware of any sick contacts but she does work at the hospital.  Past Medical History:  Diagnosis Date   ACUT MI ANTEROLAT WALL SUBSQT EPIS CARE    Acute maxillary sinusitis    ALLERGIC RHINITIS, SEASONAL    Anemia    ARTHRITIS, RHEUMATOID    shoulders and hands Enbrel>leg swelling Dr. Corliss Skains   Atrial fibrillation [facility])    a. after CABG.   Back pain    Bruxism (teeth grinding)    CAD, ARTERY BYPASS GRAFT    a. DES to RCA in 2010 then LAD occlusion s/p CABG 3 06/07/2009 with LIMA to LAD, reverse SVG to D1, reverse SVG to distal RCA. b. Cath 05/08/2016 slightly hypodense region in the intermediate branch, however she had excellent flow, FFR was normal. Vein graft to PDA and the posterolateral branch is patent, patent LIMA to LAD, occluded SVG to diagonal.   CARPAL TUNNEL SYNDROME, BILATERAL    CHOLELITHIASIS    Contrast media allergy    DERMATITIS, ALLERGIC    DIABETES MELLITUS,  TYPE I    on insulin pump dx'ed age 41 y.o    DIABETIC  RETINOPATHY    Hiatal hernia    HYPERLIPIDEMIA-MIXED    HYPERTHYROIDISM    Dr. Osborne Casco   IDA (iron deficiency anemia)    Insulin pump in place    Lymphadenopathy of head and neck    MIGRAINE W/O AURA W/INTRACT W/STATUS MIGRAINOSUS 02/19/2008   PONV (postoperative nausea and vomiting)    Psoriasis    SINUS TACHYCARDIA 11/08/2010   Sleep apnea    not using machine yet    SVT (supraventricular tachycardia)    after s/p CABG   Tendonitis    TRIGGER FINGER    all fingers b/l hands    URI     Patient Active Problem List   Diagnosis Date Noted   Lower extremity edema 06/12/2023   Myofascial pain dysfunction syndrome 03/22/2023   Left foot pain 12/05/2022   Urinary urgency 10/23/2022   Pyelonephritis 10/23/2022   Premature atrial contractions 05/15/2022   PVC's (premature ventricular contractions) 05/15/2022   Low hemoglobin 01/16/2022   Otalgia of right ear 01/09/2022   Sore throat 01/09/2022   OSA on CPAP 08/02/2021   Abnormal MRI, lumbar spine 03/20/2021   Abnormal MRI, thoracic spine 03/20/2021   Chronic mid back pain 03/17/2021   Chronic midline low back pain with  left-sided sciatica 03/17/2021   Type 1 diabetes mellitus with diabetic polyneuropathy (HCC) 02/17/2021   Severe obstructive sleep apnea-hypopnea syndrome 02/17/2021   Chronic diastolic CHF (congestive heart failure) (HCC) 12/19/2020   Chronic back pain 12/19/2020   Chronic abdominal pain 12/19/2020   Acute diastolic heart failure (HCC) 12/09/2020   Diabetic retinopathy associated with type 1 diabetes mellitus (HCC) 09/27/2020   Diabetic retinopathy (HCC) 09/27/2020   Graves' disease 09/27/2020   Chronic coronary artery disease 09/27/2020   Hypertension associated with diabetes (HCC) 09/14/2020   Obesity (BMI 30-39.9) 09/14/2020   Preventative health care 09/14/2020   Aortic atherosclerosis (HCC) 09/14/2020   Snoring 05/12/2020   Lung nodule  03/10/2020   Colitis 03/10/2020   Type 1 diabetes mellitus with proliferative retinopathy (HCC) 12/10/2019   Abnormal thyroid function test 12/10/2019   Bruxism (teeth grinding)    Migraine 10/07/2019   Contusion of left hip 07/25/2019   H/O multiple allergies 03/10/2019   Hx of anaphylaxis 03/10/2019   Cough 06/26/2018   PND (post-nasal drip) 06/26/2018   Lumbar strain, initial encounter 04/10/2018   Thoracic myofascial strain 04/10/2018   Vitamin D deficiency 07/04/2017   B12 deficiency 07/04/2017   Abnormal CT scan 06/19/2017   Gastroesophageal reflux disease 06/19/2017   Antiplatelet or antithrombotic long-term use 06/19/2017   Nasal congestion 03/15/2017   Graves disease 02/01/2017   Sleep difficulties 02/01/2017   No energy 02/01/2017   Osteopenia 02/01/2017   Bilateral hand pain 07/16/2016   Acquired trigger finger 07/16/2016   Hypertension, essential 05/08/2016   Foot pain, right 08/30/2014   Shoulder pain, right 08/30/2014   Chronic pain syndrome 01/15/2014   Multiple joint pain 01/15/2014   Lesion of lower eyelid 04/21/2013   Generalized constriction of visual field 03/31/2013   Neoplasm of uncertain behavior of skin of eyelid 03/31/2013   Posterior capsular opacification 03/31/2013   Cyst of left lower eyelid 03/30/2013   Pseudophakia of both eyes 03/30/2013   Prurigo nodularis 02/05/2013   Stasis dermatitis 02/05/2013   Psoriasis 01/21/2013   Trochanteric bursitis 11/10/2012   Achilles tendinitis 07/11/2012   Epiretinal membrane 06/17/2012   Status post cataract extraction 04/30/2012   Pes equinus, acquired 04/23/2012   Tendinitis of ankle 04/23/2012   Proliferative diabetic retinopathy of both eyes (HCC) 01/11/2012   Ongoing use of possibly toxic medication 12/05/2011   Hyperthyroidism 11/09/2010   SINUS TACHYCARDIA 11/08/2010   DERMATITIS, ALLERGIC 07/20/2010   EDEMA 07/12/2010   Dizziness 11/14/2009   ATRIAL FIBRILLATION 07/20/2009   Hx of CABG  06/07/2009   ANGINA, STABLE/EXERTIONAL 06/02/2009   Dyslipidemia, goal LDL below 70 04/26/2009   ACUT MI ANTEROLAT WALL SUBSQT EPIS CARE 04/26/2009   Chest pain 04/26/2009   DIABETIC  RETINOPATHY 04/25/2009   CARPAL TUNNEL SYNDROME, BILATERAL 04/25/2009   TRIGGER FINGER 04/25/2009   MIGRAINE W/O AURA W/INTRACT W/STATUS MIGRAINOSUS 02/19/2008   ALLERGIC RHINITIS, SEASONAL 10/16/2006   CHOLELITHIASIS 10/16/2006   Rheumatoid arthritis (HCC) 10/16/2006    Past Surgical History:  Procedure Laterality Date   ABDOMINAL HYSTERECTOMY     endometriomas b/l; total ? cervix removed; no h/o abnormal pap   Caesarean section     CARDIAC CATHETERIZATION N/A 05/08/2016   Procedure: Left Heart Cath and Cors/Grafts Angiography;  Surgeon: Kathleene Hazel, MD;  Location: [facility];  Service: Cardiovascular;  Laterality: N/A;   CARPAL TUNNEL RELEASE     b/l    CARPAL TUNNEL RELEASE Left 06/06/2021   Procedure: CARPAL TUNNEL RELEASE;  Surgeon:  Cindee Salt, MD;  Location: [facility];  Service: Orthopedics;  Laterality: Left;  AXILLARY BLOCK   CATARACT EXTRACTION, BILATERAL     CHOLECYSTECTOMY     COLONOSCOPY WITH PROPOFOL N/A 03/25/2020   Procedure: COLONOSCOPY WITH PROPOFOL;  Surgeon: Wyline Mood, MD;  Location: [facility];  Service: Gastroenterology;  Laterality: N/A;   CORONARY ARTERY BYPASS GRAFT     ESOPHAGOGASTRODUODENOSCOPY (EGD) WITH PROPOFOL N/A 03/25/2020   Procedure: ESOPHAGOGASTRODUODENOSCOPY (EGD) WITH PROPOFOL;  Surgeon: Wyline Mood, MD;  Location: [facility];  Service: Gastroenterology;  Laterality: N/A;   EYE SURGERY     laser x 2 for retinopathy    LEFT HEART CATH AND CORS/GRAFTS ANGIOGRAPHY N/A 02/22/2020   Procedure: LEFT HEART CATH AND CORS/GRAFTS ANGIOGRAPHY;  Surgeon: Kathleene Hazel, MD;  Location: [facility];  Service: Cardiovascular;  Laterality: N/A;   TRIGGER FINGER RELEASE     b/l fingers all    ULNAR NERVE TRANSPOSITION Left  06/06/2021   Procedure: ULNAR NERVE DECOMPRESSION;  Surgeon: Cindee Salt, MD;  Location: [facility];  Service: Orthopedics;  Laterality: Left;  AXILLARY BLOCK   VITRECTOMY     b/l     OB History   No obstetric history on file.      Home Medications    Prior to Admission medications   Medication Sig Start Date End Date Taking? Authorizing Provider  Ascorbic Acid 500 MG CAPS Take by mouth. 10/21/13  Yes [provider]  aspirin EC 81 MG tablet Take 81 mg by mouth daily.   Yes [provider]  benzonatate (TESSALON) 100 MG capsule Take 2 capsules (200 mg total) by mouth every 8 (eight) hours. 08/24/23  Yes Becky Augusta, NP  Blood Glucose Monitoring Suppl (FREESTYLE FREEDOM LITE) w/Device KIT Use as directed by physician to check blood sugar 02/27/22  Yes   CHELATED MAGNESIUM PO Take 250 mg by mouth daily.   Yes [provider]  clopidogrel (PLAVIX) 75 MG tablet TAKE 1 TABLET BY MOUTH ONCE DAILY 07/17/23  Yes Tonny Bollman, MD  Continuous Blood Gluc Sensor (DEXCOM G6 SENSOR) MISC Dispense and use as directed.  Change sensor every 10 days. 01/30/22  Yes   Continuous Blood Gluc Transmit (DEXCOM G6 TRANSMITTER) MISC Dispense and use as directed.  Change transmitter every 90 days. 01/30/22  Yes   cyclobenzaprine (FLEXERIL) 10 MG tablet Take 1 tablet (10 mg total) by mouth at bedtime as needed for muscle spasms. 07/11/23  Yes Deveshwar, Janalyn Rouse, MD  EPINEPHrine 0.3 mg/0.3 mL IJ SOAJ injection Inject 0.3 mg into the muscle as needed for anaphylaxis. 11/14/21  Yes McLean-Scocuzza, Pasty Spillers, MD  glucose blood (FREESTYLE LITE) test strip Use to check blood sugar 3 time(s) daily 02/27/22  Yes   insulin detemir (LEVEMIR) 100 UNIT/ML injection Inject 36 units into the skin once daily in the event of insulin pump failure. 07/09/22  Yes   insulin lispro (HUMALOG) 100 UNIT/ML injection Inject 0.9 mLs (90 Units total) into the skin daily in insulin pump.. 06/21/23  Yes    Insulin Syringe-Needle U-100 (BD INSULIN SYRINGE U/F) 31G X 5/16" 0.3 ML MISC use with insulin injections 3 times daily in the event of insulin pump failure. 11/22/21  Yes   ipratropium (ATROVENT) 0.06 % nasal spray Place 2 sprays into both nostrils 4 (four) times daily. 08/24/23  Yes Becky Augusta, NP  isosorbide mononitrate (IMDUR) 60 MG 24 hr tablet Take 1 tablet (60 mg total) by mouth daily. 03/11/23  Yes Tonny Bollman, MD  ketoconazole (NIZORAL) 2 % shampoo Apply 1 Application topically 3 (three) times a week. Wash scalp 3 times weekly, leave in for 10 minutes then rinse out 06/27/22  Yes Moye, [place], MD  Krill Oil 500 MG CAPS Take 500 mg by mouth daily.   Yes [provider]  Lancets (FREESTYLE) lancets Use to check blood sugar 3 time(s) daily. 02/27/22  Yes   leflunomide (ARAVA) 20 MG tablet Take 1 tablet (20 mg total) by mouth daily. 06/24/23  Yes Gearldine Bienenstock, PA-C  linaclotide [facility]) 290 MCG CAPS capsule Take 1 capsule (290 mcg total) by mouth daily before breakfast. 08/19/23  Yes Wyline Mood, MD  losartan (COZAAR) 100 MG tablet Take 1 tablet (100 mg total) by mouth daily. 04/17/23  Yes Swinyer, Zachary George, NP  metoprolol tartrate (LOPRESSOR) 50 MG tablet Take 0.5 tablets (25 mg total) by mouth 2 (two) times daily. 03/11/23 03/11/24 Yes Swinyer, Zachary George, NP  molnupiravir EUA (LAGEVRIO) 200 mg CAPS capsule Take 4 capsules (800 mg total) by mouth 2 (two) times daily for 5 days. 08/24/23 08/29/23 Yes Becky Augusta, NP  mupirocin ointment (BACTROBAN) 2 % Apply 1 Application topically as directed. Bid to tid to open sores in scalp 06/27/22  Yes Moye, [place], MD  ondansetron (ZOFRAN) 4 MG tablet Take 1 tablet (4 mg total) by mouth every 6 (six) hours as needed for nausea or vomiting. 03/22/23  Yes Lovorn, Aundra Millet, MD  pantoprazole (PROTONIX) 40 MG tablet Take 1 tablet (40 mg total) by mouth 2 (two) times daily. 10/15/22 10/15/23 Yes Wyline Mood, MD  potassium chloride SA (KLOR-CON M20) 20  MEQ tablet Take 1 tablet (20 mEq total) by mouth daily. 08/21/23  Yes Tonny Bollman, MD  promethazine-dextromethorphan (PROMETHAZINE-DM) 6.25-15 MG/5ML syrup Take 5 mLs by mouth 4 (four) times daily as needed. 08/24/23  Yes Becky Augusta, NP  rosuvastatin (CRESTOR) 20 MG tablet Take 1 tablet (20 mg total) by mouth daily. 06/10/23  Yes Tonny Bollman, MD  torsemide (DEMADEX) 20 MG tablet Take 1 tablet (20 mg total) by mouth daily. 03/11/23  Yes Tonny Bollman, MD  Upadacitinib ER (RINVOQ) 15 MG TB24 Take 1 tablet (15 mg) by mouth daily. 05/29/23  Yes Gearldine Bienenstock, PA-C  albuterol (VENTOLIN HFA) 108 (90 Base) MCG/ACT inhaler Inhale 2 puffs into the lungs every 4 (four) hours as needed for wheezing or shortness of breath. 08/24/23   Becky Augusta, NP  promethazine (PHENERGAN) 25 MG tablet TAKE 1/2- 1 TABLETS (12.5-25 MG TOTAL) BY MOUTH 2 TIMES DAILY AS NEEDED FOR NAUSEA OR VOMITING. 03/22/21 12/10/22  McLean-Scocuzza, Pasty Spillers, MD    Family History Family History  Problem Relation Age of Onset   Depression Mother    Anxiety disorder Mother    Colon polyps Father    Diabetes Father        2   Hypertension Father    Parkinson's disease Father    Stroke Paternal Grandmother    Heart disease Other    Hypertension Other    Hyperlipidemia Other    Depression Other    Migraines Other    Heart attack Neg Hx    Colon cancer Neg Hx    Stomach cancer Neg Hx    Esophageal cancer Neg Hx     Social History Social History   Tobacco Use   Smoking status: Never    Passive exposure: Never   Smokeless tobacco: Never  Vaping Use   Vaping status: Never Used  Substance Use Topics   Alcohol use: Yes    Comment: rarely 1 every 6 months   Drug use: No     Allergies   Actemra [tocilizumab], Ramipril, Shellfish-derived products, Atorvastatin, Compazine  [prochlorperazine edisylate], Emgality [galcanezumab-gnlm], Etanercept, Infliximab, Iohexol, Orencia [abatacept], Prochlorperazine edisylate,  Tofacitinib, Trokendi xr [topiramate er], Tramadol, Amiodarone, and Rituximab   Review of Systems Review of Systems  Constitutional:  Positive for fever.  HENT:  Positive for congestion and rhinorrhea. Negative for ear pain and sore throat.   Respiratory:  Positive for cough and wheezing. Negative for shortness of breath.   Cardiovascular:  Positive for chest pain.       Intermittent, episodic chest pain she is associating with coughing.  Gastrointestinal:  Positive for nausea. Negative for diarrhea and vomiting.  Neurological:  Positive for dizziness.     Physical Exam Triage Vital Signs ED Triage Vitals  Encounter Vitals Group     BP      Systolic BP Percentile      Diastolic BP Percentile      Pulse      Resp      Temp      Temp src      SpO2      Weight      Height      Head Circumference      Peak Flow      Pain Score      Pain Loc      Pain Education      Exclude from Growth Chart    No data found.  Updated Vital Signs BP 134/75 (BP Location: Right Arm)   Pulse 85   Temp 99.3 F (37.4 C) (Oral)   Resp 18   Wt 180 lb (81.6 kg)   SpO2 95%   BMI 31.89 kg/m   Visual Acuity Right Eye Distance:   Left Eye Distance:   Bilateral Distance:    Right Eye Near:   Left Eye Near:    Bilateral Near:     Physical Exam Vitals and nursing note reviewed.  Constitutional:      Appearance: Normal appearance. She is not ill-appearing.  HENT:     Head: Normocephalic and atraumatic.     Right Ear: Tympanic membrane, ear canal and external ear normal. There is no impacted cerumen.     Left Ear: Tympanic membrane, ear canal and external ear normal. There is no impacted cerumen.     Nose: Congestion and rhinorrhea present.     Comments: Patient mucosa is edematous and erythematous with thick white discharge in both nares.    Mouth/Throat:     Mouth: Mucous membranes are moist.     Pharynx: Oropharynx is clear. No oropharyngeal exudate or posterior oropharyngeal  erythema.  Cardiovascular:     Rate and Rhythm: Normal rate and regular rhythm.     Pulses: Normal pulses.     Heart sounds: Normal heart sounds. No murmur heard.    No friction rub. No gallop.  Pulmonary:     Effort: Pulmonary effort is normal.     Breath sounds: Normal breath sounds. No wheezing, rhonchi or rales.  Musculoskeletal:     Cervical back: Normal range of motion and neck supple.  Lymphadenopathy:     Cervical: No cervical adenopathy.  Skin:    General: Skin is warm and dry.     Capillary Refill: Capillary refill takes less than 2 seconds.     Findings: No rash.  Neurological:  General: No focal deficit present.     Mental Status: She is alert and oriented to person, place, and time.      UC Treatments / Results  Labs (all labs ordered are listed, but only abnormal results are displayed) Labs Reviewed  SARS CORONAVIRUS 2 BY RT PCR - Abnormal; Notable for the following components:      Result Value   SARS Coronavirus 2 by RT PCR POSITIVE (*)    All other components within normal limits    EKG   Radiology No results found.  Procedures Procedures (including critical care time)  Medications Ordered in UC Medications - No data to display  Initial Impression / Assessment and Plan / UC Course  I have reviewed the triage vital signs and the nursing notes.  Pertinent labs & imaging results that were available during my care of the patient were reviewed by me and considered in my medical decision making (see chart for details).   Patient is a nontoxic-appearing 57 year old female presenting for evaluation of respiratory complaints that have been ongoing for months and have intensified in the last day and a half.  The patient is not in any acute distress and she is able to speak in full sentence without dyspnea or tachypnea.  She has an elevated temp of 99.3 but is afebrile currently.  Tmax at home was 100.7.  Room air oxygen saturation is 95% and respiratory  18.  She does have inflammation of her upper respiratory tract on exam with thick white rhinorrhea.  Cardiopulmonary exam reveals S1-S2 heart sounds with regular rate and rhythm and lungs that are clear to auscultation in all fields.  I will order a COVID PCR to rule out the presence of COVID-19.  Given her extensive cardiac history, if her COVID-19 is negative, I will cover her with antibiotics.  Additionally, I will prescribe medication to help her with her cough.  COVID PCR is positive.  I will discharge patient home with a diagnosis of COVID-19.  Due to significant number of medication interactions I will start her on molnupiravir versus Paxlovid for treatment of COVID-19.  I will also prescribe aspirin nasal spray to help the nasal congestion and Tessalon Perles and Promethazine DM cough syrup help with cough and congestion.  She can use over-the-counter Tylenol as needed for body aches or fever.  Return and ER precautions reviewed.  Final Clinical Impressions(s) / UC Diagnoses   Final diagnoses:  COVID-19     Discharge Instructions      CDC guidelines state that you must wear a mask for the first 5 days of symptoms when you are around other people.  After 5 days you no longer need to mask as you are no longer considered infectious.  There is no longer need to quarantine unless you have a fever.  If you do have a fever then you need to quarantine until you have been fever free for 24 hours without taking Tylenol and/or ibuprofen.  Use over-the-counter Tylenol and/or ibuprofen according to the package instructions as needed for fever and pain.  Use the Atrovent nasal spray, 2 squirts up each nostril every 6 hours, as needed for nasal congestion and runny nose.  Use the Tessalon Perles every 8 hours during the day as needed for cough.  Take them with a small sip of water.  You may experience numbness to the base of your tongue or metallic taste in her mouth, this is normal.  Use the  Promethazine DM cough  syrup at bedtime as needed for cough and congestion.  Be mindful this medication will make you sleepy.  Take the molnupiravir twice daily for 5 days for treatment of COVID-19.  I have also sent in the refill for your albuterol inhaler should you need it.  You can use 2 puffs every 4-6 hours as needed for any shortness of breath or wheezing.  If you develop any worsening respiratory symptoms such as shortness of breath, shortness of breath at rest, feel as though you cannot catch your breath, you are unable to speak in full sentences, or, as a late sign, your lips begin turning blue you need to call 911 and go to the ER for evaluation.      ED Prescriptions     Medication Sig Dispense Auth. Provider   albuterol (VENTOLIN HFA) 108 (90 Base) MCG/ACT inhaler Inhale 2 puffs into the lungs every 4 (four) hours as needed for wheezing or shortness of breath. 6.7 g Becky Augusta, NP   benzonatate (TESSALON) 100 MG capsule Take 2 capsules (200 mg total) by mouth every 8 (eight) hours. 21 capsule Becky Augusta, NP   ipratropium (ATROVENT) 0.06 % nasal spray Place 2 sprays into both nostrils 4 (four) times daily. 15 mL Becky Augusta, NP   molnupiravir EUA (LAGEVRIO) 200 mg CAPS capsule Take 4 capsules (800 mg total) by mouth 2 (two) times daily for 5 days. 40 capsule Becky Augusta, NP   promethazine-dextromethorphan (PROMETHAZINE-DM) 6.25-15 MG/5ML syrup Take 5 mLs by mouth 4 (four) times daily as needed. 118 mL Becky Augusta, NP      PDMP not reviewed this encounter.   Becky Augusta, NP 08/24/23 [phone]

## 2023-08-26 ENCOUNTER — Other Ambulatory Visit (HOSPITAL_COMMUNITY): Payer: Self-pay

## 2023-08-26 ENCOUNTER — Other Ambulatory Visit: Payer: Self-pay

## 2023-08-26 ENCOUNTER — Encounter (HOSPITAL_COMMUNITY): Payer: Self-pay

## 2023-08-27 ENCOUNTER — Other Ambulatory Visit: Payer: Commercial Managed Care - PPO

## 2023-08-30 ENCOUNTER — Ambulatory Visit (INDEPENDENT_AMBULATORY_CARE_PROVIDER_SITE_OTHER)
Admission: RE | Admit: 2023-08-30 | Discharge: 2023-08-30 | Disposition: A | Discharge status: Home / Self Care | Payer: Commercial Managed Care - PPO | Source: Ambulatory Visit | Attending: Nurse Practitioner | Admitting: Nurse Practitioner

## 2023-08-30 ENCOUNTER — Encounter: Payer: Self-pay | Admitting: Nurse Practitioner

## 2023-08-30 ENCOUNTER — Encounter: Payer: Commercial Managed Care - PPO | Admitting: Physical Medicine and Rehabilitation

## 2023-08-30 ENCOUNTER — Ambulatory Visit: Payer: Commercial Managed Care - PPO | Admitting: Nurse Practitioner

## 2023-08-30 VITALS — BP 110/60 | HR 78 | Temp 99.4°F | Ht 63.0 in | Wt 180.0 lb

## 2023-08-30 DIAGNOSIS — J22 Unspecified acute lower respiratory infection: Secondary | ICD-10-CM | POA: Insufficient documentation

## 2023-08-30 DIAGNOSIS — R051 Acute cough: Secondary | ICD-10-CM | POA: Diagnosis not present

## 2023-08-30 DIAGNOSIS — R0602 Shortness of breath: Secondary | ICD-10-CM

## 2023-08-30 DIAGNOSIS — Z951 Presence of aortocoronary bypass graft: Secondary | ICD-10-CM | POA: Diagnosis not present

## 2023-08-30 DIAGNOSIS — R0989 Other specified symptoms and signs involving the circulatory and respiratory systems: Secondary | ICD-10-CM | POA: Diagnosis not present

## 2023-08-30 DIAGNOSIS — R059 Cough, unspecified: Secondary | ICD-10-CM | POA: Diagnosis not present

## 2023-08-30 MED ORDER — GUAIFENESIN-CODEINE 100-10 MG/5ML PO SOLN
5.0000 mL | Freq: Three times a day (TID) | ORAL | 0 refills | Status: AC | PRN
Start: 2023-08-30 — End: 2023-09-06

## 2023-08-30 MED ORDER — PREDNISONE 20 MG PO TABS
ORAL_TABLET | ORAL | 0 refills | Status: AC
Start: 2023-08-30 — End: 2023-09-05

## 2023-08-30 MED ORDER — AMOXICILLIN-POT CLAVULANATE 875-125 MG PO TABS
1.0000 | ORAL_TABLET | Freq: Two times a day (BID) | ORAL | 0 refills | Status: AC
Start: 2023-08-30 — End: 2023-09-09

## 2023-08-30 NOTE — Patient Instructions (Signed)
Nice to treat you today I am going to treat you for a pneumonia Let me know Tuesday how you are feeling

## 2023-08-30 NOTE — Assessment & Plan Note (Signed)
Patient uses albuterol inhaler without any relief.  She did have some wheezing on exam we will write steroid taper.  Patient states she has had a PRP injection.  Informed her to be away for steroid she will call Ortho today to see the ramifications as patient does need steroids patient is also diabetic and has parameters from her endocrinologist in regards to sick day sugar management

## 2023-08-30 NOTE — Progress Notes (Signed)
Acute Office Visit  Subjective:     Patient ID: [name], female    DOB: 04-10-1966, 57 y.o.   MRN: [id number]  Chief Complaint  Patient presents with   Covid Positive   Follow-up    Tested positive. Finished Antibiotic Thursday. Did not help much. States she is not really feeling better.     HPI Patient is in today for covid positive f/u with a history of  htn, CHF, afib, MI, OSA, DM1, RA  Patient was seen at urgent care on 08/24/2023 with a diagnosis of COVID-19.  Patient was written Tessalon Perles 200 mg 3 times daily, ipratropium nasal spray, molnupiravir, Promethazine DM cough medication.  Patient states that she has finished her antiviral treatment is not feeling any better and is here for follow-up.  States that on Wednesday she was feeling etter and went to work on Thursday. States that she started feeling poor again and started running a fever. She is having sputum that is thick and white. She has tired her albuterol without great relief. Does endorse shortness of breath    Review of Systems  Constitutional:  Positive for chills, fever and malaise/fatigue.  Respiratory:  Positive for cough, sputum production and shortness of breath.   Cardiovascular:  Negative for chest pain.  Gastrointestinal:  Positive for diarrhea. Negative for abdominal pain, nausea and vomiting.  Musculoskeletal:  Positive for myalgias.  Neurological:  Positive for headaches.        Objective:    BP 110/60   Pulse 78   Temp 99.4 F (37.4 C) (Temporal)   Ht 5\' 3"  (1.6 m)   Wt 180 lb (81.6 kg)   SpO2 94%   BMI 31.89 kg/m  BP Readings from Last 3 Encounters:  08/30/23 110/60  08/24/23 134/75  08/21/23 110/72   Wt Readings from Last 3 Encounters:  08/30/23 180 lb (81.6 kg)  08/24/23 180 lb (81.6 kg)  08/21/23 181 lb 12.8 oz (82.5 kg)      Physical Exam Vitals and nursing note reviewed.  Constitutional:      Appearance: Normal appearance.  HENT:     Right Ear: Ear canal  and external ear normal.     Left Ear: Ear canal and external ear normal.     Nose:     Right Sinus: Maxillary sinus tenderness and frontal sinus tenderness present.     Left Sinus: Frontal sinus tenderness present.     Mouth/Throat:     Mouth: Mucous membranes are moist.     Pharynx: Oropharynx is clear.  Cardiovascular:     Rate and Rhythm: Normal rate and regular rhythm.     Heart sounds: Normal heart sounds.  Pulmonary:     Effort: Pulmonary effort is normal.     Breath sounds: Wheezing present.  Lymphadenopathy:     Cervical: No cervical adenopathy.  Neurological:     Mental Status: She is alert.     No results found for any visits on 08/30/23.      Assessment & Plan:   Problem List Items Addressed This Visit       Respiratory   Lower respiratory infection - Primary    Patient tested positive for COVID 1 week ago.  Started to improve and then worsened with new onset fever given immune suppressing therapy and multiple comorbidities will like to treat with a  broad-spectrum antibiotic like Augmentin.  To cover pneumonia.  Pending chest x-ray      Relevant Medications  amoxicillin-clavulanate (AUGMENTIN) 875-125 MG tablet   Other Relevant Orders   DG Chest 2 View (Completed)     Other   Shortness of breath    Patient uses albuterol inhaler without any relief.  She did have some wheezing on exam we will write steroid taper.  Patient states she has had a PRP injection.  Informed her to be away for steroid she will call Ortho today to see the ramifications as patient does need steroids patient is also diabetic and has parameters from her endocrinologist in regards to sick day sugar management      Relevant Medications   predniSONE (DELTASONE) 20 MG tablet   Cough    Resistant to Tessalon Perles and Promethazine DM will do codeine with guaifenesin as patient has tolerated that has worked well before.      Relevant Medications   guaiFENesin-codeine 100-10 MG/5ML  syrup   predniSONE (DELTASONE) 20 MG tablet    Meds ordered this encounter  Medications   guaiFENesin-codeine 100-10 MG/5ML syrup    Sig: Take 5 mLs by mouth 3 (three) times daily as needed for up to 7 days.    Dispense:  105 mL    Refill:  0    Order Specific Question:   Supervising Provider    Answer:   Roxy Manns A [[id number]]   amoxicillin-clavulanate (AUGMENTIN) 875-125 MG tablet    Sig: Take 1 tablet by mouth 2 (two) times daily for 10 days.    Dispense:  20 tablet    Refill:  0    Order Specific Question:   Supervising Provider    Answer:   Milinda Antis, MARNE A [[id number]]   predniSONE (DELTASONE) 20 MG tablet    Sig: Take 2 tablets (40 mg total) by mouth daily with breakfast for 3 days, THEN 1 tablet (20 mg total) daily with breakfast for 3 days.    Dispense:  9 tablet    Refill:  0    Order Specific Question:   Supervising Provider    Answer:   TOWER, MARNE A [[id number]]    Return if symptoms worsen or fail to improve.  Audria Nine, NP

## 2023-08-30 NOTE — Assessment & Plan Note (Signed)
Resistant to [employer] and Promethazine DM will do codeine with guaifenesin as patient has tolerated that has worked well before.

## 2023-08-30 NOTE — Telephone Encounter (Signed)
Per Audria Nine, NP would like to see pt in office for assessment of symptoms.  Phone call to pt. She is scheduled for appointment today 3:20p.

## 2023-08-30 NOTE — Assessment & Plan Note (Signed)
Patient tested positive for COVID 1 week ago.  Started to improve and then worsened with new onset fever given immune suppressing therapy and multiple comorbidities will like to treat with a  broad-spectrum antibiotic like Augmentin.  To cover pneumonia.  Pending chest x-ray

## 2023-09-04 ENCOUNTER — Other Ambulatory Visit: Payer: Self-pay | Admitting: Physician Assistant

## 2023-09-04 ENCOUNTER — Other Ambulatory Visit (HOSPITAL_COMMUNITY): Payer: Self-pay

## 2023-09-04 DIAGNOSIS — M0609 Rheumatoid arthritis without rheumatoid factor, multiple sites: Secondary | ICD-10-CM

## 2023-09-04 MED ORDER — RINVOQ 15 MG PO TB24
15.0000 mg | ORAL_TABLET | Freq: Every day | ORAL | 0 refills | Status: DC
Start: 2023-09-04 — End: 2023-11-21
  Filled 2023-09-04: qty 30, 30d supply, fill #0
  Filled 2023-10-01: qty 30, 30d supply, fill #1
  Filled 2023-10-30: qty 30, 30d supply, fill #2

## 2023-09-04 NOTE — Telephone Encounter (Signed)
Last Fill: 05/29/2023  Labs: 06/12/2023 RBC 3.84, Hgb 11.8, Hct 34.6, Sodium 134, Glucose 118  TB Gold: 10/10/2022 Neg    Next Visit: 12/10/2023  Last Visit: 06/13/2023  [id number] arthritis of multiple sites with negative rheumatoid factor   Current Dose per office note 06/13/2023: Rinvoq 15 mg 1 tablet by mouth once daily   Okay to refill Rinvoq?

## 2023-09-05 ENCOUNTER — Other Ambulatory Visit: Payer: Self-pay

## 2023-09-08 ENCOUNTER — Other Ambulatory Visit: Payer: Self-pay | Admitting: Physician Assistant

## 2023-09-08 ENCOUNTER — Other Ambulatory Visit: Payer: Self-pay

## 2023-09-09 ENCOUNTER — Other Ambulatory Visit: Payer: Self-pay

## 2023-09-09 MED FILL — Leflunomide Tab 20 MG: ORAL | 90 days supply | Qty: 90 | Fill #0 | Status: CN

## 2023-09-09 NOTE — Telephone Encounter (Signed)
Last Fill: 06/24/2023  Labs: 06/12/2023 Sodium 134, Glucose 118  Next Visit: 12/10/2023  Last Visit: 06/13/2023  DX: Rheumatoid arthritis of multiple sites with negative rheumatoid factor   Current Dose per office note 06/13/2023: Arava 20 mg 1 tablet by mouth daily.   Left message to advise patient she is due to update labs.   Okay to refill Arava ?

## 2023-09-12 ENCOUNTER — Other Ambulatory Visit: Payer: Self-pay | Admitting: *Deleted

## 2023-09-12 DIAGNOSIS — Z79899 Other long term (current) drug therapy: Secondary | ICD-10-CM

## 2023-09-13 ENCOUNTER — Other Ambulatory Visit: Payer: Self-pay

## 2023-09-13 LAB — CBC WITH DIFFERENTIAL/PLATELET
Absolute Monocytes: 547 {cells}/uL (ref 200–950)
Basophils Absolute: 19 {cells}/uL (ref 0–200)
Basophils Relative: 0.5 %
Eosinophils Absolute: 72 {cells}/uL (ref 15–500)
Eosinophils Relative: 1.9 %
HCT: 30.9 % — ABNORMAL LOW (ref 35.0–45.0)
Hemoglobin: 10.5 g/dL — ABNORMAL LOW (ref 11.7–15.5)
Lymphs Abs: 1661 {cells}/uL (ref 850–3900)
MCH: 29.9 pg (ref 27.0–33.0)
MCHC: 34 g/dL (ref 32.0–36.0)
MCV: 88 fL (ref 80.0–100.0)
MPV: 9.5 fL (ref 7.5–12.5)
Monocytes Relative: 14.4 %
Neutro Abs: 1501 {cells}/uL (ref 1500–7800)
Neutrophils Relative %: 39.5 %
Platelets: 248 10*3/uL (ref 140–400)
RBC: 3.51 10*6/uL — ABNORMAL LOW (ref 3.80–5.10)
RDW: 12.3 % (ref 11.0–15.0)
Total Lymphocyte: 43.7 %
WBC: 3.8 10*3/uL (ref 3.8–10.8)

## 2023-09-13 LAB — COMPLETE METABOLIC PANEL WITH GFR
AG Ratio: 1.9 (calc) (ref 1.0–2.5)
ALT: 19 U/L (ref 6–29)
AST: 22 U/L (ref 10–35)
Albumin: 3.9 g/dL (ref 3.6–5.1)
Alkaline phosphatase (APISO): 59 U/L (ref 37–153)
BUN: 9 mg/dL (ref 7–25)
CO2: 28 mmol/L (ref 20–32)
Calcium: 8.9 mg/dL (ref 8.6–10.4)
Chloride: 101 mmol/L (ref 98–110)
Creat: 0.8 mg/dL (ref 0.50–1.03)
Globulin: 2.1 g/dL (ref 1.9–3.7)
Glucose, Bld: 159 mg/dL — ABNORMAL HIGH (ref 65–99)
Potassium: 4 mmol/L (ref 3.5–5.3)
Sodium: 138 mmol/L (ref 135–146)
Total Bilirubin: 0.5 mg/dL (ref 0.2–1.2)
Total Protein: 6 g/dL — ABNORMAL LOW (ref 6.1–8.1)
eGFR: 87 mL/min/{1.73_m2} (ref >=60)

## 2023-09-13 MED FILL — Leflunomide Tab 20 MG: ORAL | 90 days supply | Qty: 90 | Fill #0 | Status: AC

## 2023-09-13 NOTE — Progress Notes (Signed)
RBC count, hgb, and hct remain low and have trended down.  Please notify the patient and clarify if she has seen hematology in the past.  Glucose is 159.  Total protein is borderline low. Rest of CMP WNL.

## 2023-09-15 ENCOUNTER — Encounter: Payer: Self-pay | Admitting: Nurse Practitioner

## 2023-09-15 DIAGNOSIS — B379 Candidiasis, unspecified: Secondary | ICD-10-CM

## 2023-09-16 ENCOUNTER — Other Ambulatory Visit: Payer: Self-pay

## 2023-09-16 ENCOUNTER — Ambulatory Visit
Admission: RE | Admit: 2023-09-16 | Discharge: 2023-09-16 | Disposition: A | Discharge status: Home / Self Care | Payer: Commercial Managed Care - PPO | Source: Ambulatory Visit | Attending: Cardiovascular Disease | Admitting: Cardiovascular Disease

## 2023-09-16 DIAGNOSIS — I2510 Atherosclerotic heart disease of native coronary artery without angina pectoris: Secondary | ICD-10-CM

## 2023-09-16 DIAGNOSIS — R0602 Shortness of breath: Secondary | ICD-10-CM | POA: Diagnosis not present

## 2023-09-16 DIAGNOSIS — I517 Cardiomegaly: Secondary | ICD-10-CM | POA: Insufficient documentation

## 2023-09-16 DIAGNOSIS — E782 Mixed hyperlipidemia: Secondary | ICD-10-CM

## 2023-09-16 DIAGNOSIS — E119 Type 2 diabetes mellitus without complications: Secondary | ICD-10-CM | POA: Insufficient documentation

## 2023-09-16 DIAGNOSIS — I10 Essential (primary) hypertension: Secondary | ICD-10-CM

## 2023-09-16 DIAGNOSIS — I25119 Atherosclerotic heart disease of native coronary artery with unspecified angina pectoris: Secondary | ICD-10-CM | POA: Diagnosis not present

## 2023-09-16 LAB — ECHOCARDIOGRAM COMPLETE
AR max vel: 1.97 cm2
AV Area VTI: 2.27 cm2
AV Area mean vel: 2.05 cm2
AV Mean grad: 3 mmHg
AV Peak grad: 4.8 mmHg
Ao pk vel: 1.1 m/s
Area-P 1/2: 3.53 cm2
MV VTI: 2.18 cm2
S' Lateral: 2.4 cm

## 2023-09-16 MED ORDER — FLUCONAZOLE 150 MG PO TABS
150.0000 mg | ORAL_TABLET | Freq: Once | ORAL | 0 refills | Status: AC
Start: 2023-09-16 — End: 2023-09-17
  Filled 2023-09-16: qty 1, 1d supply, fill #0

## 2023-09-16 NOTE — Addendum Note (Signed)
Addended by: Eden Emms on: 09/16/2023 01:45 PM   Modules accepted: Orders

## 2023-09-16 NOTE — Progress Notes (Signed)
*  PRELIMINARY RESULTS* Echocardiogram 2D Echocardiogram has been performed.  [name] 09/16/2023, 9:42 AM

## 2023-09-16 NOTE — Telephone Encounter (Signed)
Patient was given antibiotics 8/30. Do you need her to be seen in office?

## 2023-09-17 ENCOUNTER — Other Ambulatory Visit: Payer: Self-pay | Admitting: *Deleted

## 2023-09-17 ENCOUNTER — Other Ambulatory Visit: Payer: Self-pay

## 2023-09-17 DIAGNOSIS — D509 Iron deficiency anemia, unspecified: Secondary | ICD-10-CM

## 2023-09-18 ENCOUNTER — Inpatient Hospital Stay: Payer: Commercial Managed Care - PPO | Attending: Oncology

## 2023-09-18 ENCOUNTER — Encounter: Payer: Self-pay | Admitting: Oncology

## 2023-09-18 ENCOUNTER — Inpatient Hospital Stay: Payer: Commercial Managed Care - PPO | Admitting: Oncology

## 2023-09-18 VITALS — BP 108/65 | HR 93 | Temp 96.7°F | Resp 18 | Ht 63.0 in | Wt 178.7 lb

## 2023-09-18 DIAGNOSIS — D638 Anemia in other chronic diseases classified elsewhere: Secondary | ICD-10-CM

## 2023-09-18 DIAGNOSIS — D649 Anemia, unspecified: Secondary | ICD-10-CM | POA: Diagnosis not present

## 2023-09-18 DIAGNOSIS — D72819 Decreased white blood cell count, unspecified: Secondary | ICD-10-CM | POA: Insufficient documentation

## 2023-09-18 DIAGNOSIS — D509 Iron deficiency anemia, unspecified: Secondary | ICD-10-CM

## 2023-09-18 DIAGNOSIS — M069 Rheumatoid arthritis, unspecified: Secondary | ICD-10-CM | POA: Insufficient documentation

## 2023-09-18 LAB — CBC WITH DIFFERENTIAL (CANCER CENTER ONLY)
Abs Immature Granulocytes: 0 10*3/uL (ref 0.00–0.07)
Basophils Absolute: 0 10*3/uL (ref 0.0–0.1)
Basophils Relative: 1 %
Eosinophils Absolute: 0.1 10*3/uL (ref 0.0–0.5)
Eosinophils Relative: 2 %
HCT: 33.5 % — ABNORMAL LOW (ref 36.0–46.0)
Hemoglobin: 11.1 g/dL — ABNORMAL LOW (ref 12.0–15.0)
Immature Granulocytes: 0 %
Lymphocytes Relative: 44 %
Lymphs Abs: 1.5 10*3/uL (ref 0.7–4.0)
MCH: 29.9 pg (ref 26.0–34.0)
MCHC: 33.1 g/dL (ref 30.0–36.0)
MCV: 90.3 fL (ref 80.0–100.0)
Monocytes Absolute: 0.4 10*3/uL (ref 0.1–1.0)
Monocytes Relative: 11 %
Neutro Abs: 1.4 10*3/uL — ABNORMAL LOW (ref 1.7–7.7)
Neutrophils Relative %: 42 %
Platelet Count: 188 10*3/uL (ref 150–400)
RBC: 3.71 MIL/uL — ABNORMAL LOW (ref 3.87–5.11)
RDW: 12.7 % (ref 11.5–15.5)
WBC Count: 3.4 10*3/uL — ABNORMAL LOW (ref 4.0–10.5)
nRBC: 0 % (ref 0.0–0.2)

## 2023-09-18 LAB — VITAMIN B12: Vitamin B-12: 757 pg/mL (ref 180–914)

## 2023-09-18 LAB — FERRITIN: Ferritin: 58 ng/mL (ref 11–307)

## 2023-09-18 LAB — FOLATE: Folate: 39 ng/mL (ref >5.9)

## 2023-09-18 LAB — IRON AND TIBC
Iron: 83 ug/dL (ref 28–170)
Saturation Ratios: 20 % (ref 10.4–31.8)
TIBC: 406 ug/dL (ref 250–450)
UIBC: 323 ug/dL

## 2023-09-20 ENCOUNTER — Other Ambulatory Visit: Payer: Self-pay

## 2023-09-20 NOTE — Progress Notes (Signed)
Hematology/Oncology Consult note [facility]  [phone] [phone]  Patient Care Team: Eden Emms, NP as PCP - General (Nurse Practitioner) Tonny Bollman, MD as PCP - Cardiology (Cardiology) Warden Fillers, MD Levert Feinstein, MD as Consulting Physician (Neurology) Levert Feinstein, MD as Referring Physician (Neurology) Creig Hines, MD as Consulting Physician (Hematology and Oncology)   Name of the patient: [name]  [id number]  1966/12/04   Date of visit: 09/20/23  Diagnosis-normocytic anemia likely secondary to anemia of chronic disease  Chief complaint/ Reason for visit-reestablish follow-up for anemia  Heme/Onc history: Patient is a 57 year old female with past medical history significant for obstructive sleep apnea, type 1 diabetes, hypertension, rheumatoid arthritis among other medical problems.  She has been referred for anemia.  Her most recent CBC from 01/11/2022 showed white count of 4.6, H&H of 11.9/34.8 with an MCV of 88 and a platelet count of 222.  Iron studies showed a low iron saturation of 16% and a ferritin of 48 with a TIBC that was normal at 361.  TSH normal.  Patient has started taking oral iron about 2 to 3 weeks ago.  She denies any bleeding in her stool or urine.  She has had EGD and colonoscopy by Dr. Tobi Bastos in March 2021.  EGD was normal colonoscopy was also normal and a repeat colonoscopy was recommended in 5 years .   Previous anemia workup in May 2023 did not reveal any specific cause of anemia.  No evidence of myeloma.  No evidence of hemolysis and iron and B12 levels were unremarkable.  Interval history-health wise she is doing okay and has not had any recent hospitalizations.  She continues to follow-up with rheumatology for rheumatoidArthritis and is on leflunomide for control of arthritis.  Blood sugars are better controlled as well.  ECOG PS- 1 Pain scale- 0   Review of systems- Review of Systems   Constitutional:  Positive for malaise/fatigue. Negative for chills, fever and weight loss.  HENT:  Negative for congestion, ear discharge and nosebleeds.   Eyes:  Negative for blurred vision.  Respiratory:  Negative for cough, hemoptysis, sputum production, shortness of breath and wheezing.   Cardiovascular:  Negative for chest pain, palpitations, orthopnea and claudication.  Gastrointestinal:  Negative for abdominal pain, blood in stool, constipation, diarrhea, heartburn, melena, nausea and vomiting.  Genitourinary:  Negative for dysuria, flank pain, frequency, hematuria and urgency.  Musculoskeletal:  Negative for back pain, joint pain and myalgias.  Skin:  Negative for rash.  Neurological:  Negative for dizziness, tingling, focal weakness, seizures, weakness and headaches.  Endo/Heme/Allergies:  Does not bruise/bleed easily.  Psychiatric/Behavioral:  Negative for depression and suicidal ideas. The patient does not have insomnia.       Allergies  Allergen Reactions   Actemra [Tocilizumab]    Ramipril Swelling, Rash and Other (See Comments)   Shellfish-Derived Products Swelling    Shrimp    Atorvastatin Rash    Elevated LFT's   Compazine  [Prochlorperazine Edisylate] Other (See Comments)    Neurological reaction   Emgality [Galcanezumab-Gnlm] Hives and Swelling   Etanercept Swelling and Rash   Infliximab Rash   Iohexol     Iv contrast dye -rash all over   Orencia [Abatacept] Rash   Prochlorperazine Edisylate     unknown   Tofacitinib Rash and Other (See Comments)    Severe abdominal pain    [employer]]     [employer], memory issues, word finding  issues   Tramadol     Nausea    Amiodarone Nausea Only   Rituximab Rash    Causes a rash     Past Medical History:  Diagnosis Date   ACUT MI ANTEROLAT WALL SUBSQT EPIS CARE    Acute maxillary sinusitis    ALLERGIC RHINITIS, SEASONAL    Anemia    ARTHRITIS, RHEUMATOID    shoulders and hands Enbrel>leg  swelling Dr. Corliss Skains   Atrial fibrillation [facility])    a. after CABG.   Back pain    Bruxism (teeth grinding)    CAD, ARTERY BYPASS GRAFT    a. DES to RCA in 2010 then LAD occlusion s/p CABG 3 06/07/2009 with LIMA to LAD, reverse SVG to D1, reverse SVG to distal RCA. b. Cath 05/08/2016 slightly hypodense region in the intermediate branch, however she had excellent flow, FFR was normal. Vein graft to PDA and the posterolateral branch is patent, patent LIMA to LAD, occluded SVG to diagonal.   CARPAL TUNNEL SYNDROME, BILATERAL    CHOLELITHIASIS    Contrast media allergy    DERMATITIS, ALLERGIC    DIABETES MELLITUS, TYPE I    on insulin pump dx'ed age 29 y.o    DIABETIC  RETINOPATHY    Hiatal hernia    HYPERLIPIDEMIA-MIXED    HYPERTHYROIDISM    Dr. Osborne Casco   IDA (iron deficiency anemia)    Insulin pump in place    Lymphadenopathy of head and neck    MIGRAINE W/O AURA W/INTRACT W/STATUS MIGRAINOSUS 02/19/2008   PONV (postoperative nausea and vomiting)    Psoriasis    SINUS TACHYCARDIA 11/08/2010   Sleep apnea    not using machine yet    SVT (supraventricular tachycardia)    after s/p CABG   Tendonitis    TRIGGER FINGER    all fingers b/l hands    URI      Past Surgical History:  Procedure Laterality Date   ABDOMINAL HYSTERECTOMY     endometriomas b/l; total ? cervix removed; no h/o abnormal pap   Caesarean section     CARDIAC CATHETERIZATION N/A 05/08/2016   Procedure: Left Heart Cath and Cors/Grafts Angiography;  Surgeon: Kathleene Hazel, MD;  Location[facility] INVASIVE CV LAB;  Service: Cardiovascular;  Laterality: N/A;   CARPAL TUNNEL RELEASE     b/l    CARPAL TUNNEL RELEASE Left 06/06/2021   Procedure: CARPAL TUNNEL RELEASE;  Surgeon: Cindee Salt, MD;  Location: [facility];  Service: Orthopedics;  Laterality: Left;  AXILLARY BLOCK   CATARACT EXTRACTION, BILATERAL     CHOLECYSTECTOMY     COLONOSCOPY WITH PROPOFOL N/A 03/25/2020   Procedure: COLONOSCOPY  WITH PROPOFOL;  Surgeon: Wyline Mood, MD;  Location[facility] ENDOSCOPY;  Service: Gastroenterology;  Laterality: N/A;   CORONARY ARTERY BYPASS GRAFT     ESOPHAGOGASTRODUODENOSCOPY (EGD) WITH PROPOFOL N/A 03/25/2020   Procedure: ESOPHAGOGASTRODUODENOSCOPY (EGD) WITH PROPOFOL;  Surgeon: Wyline Mood, MD;  Location: [facility] ENDOSCOPY;  Service: Gastroenterology;  Laterality: N/A;   EYE SURGERY     laser x 2 for retinopathy    LEFT HEART CATH AND CORS/GRAFTS ANGIOGRAPHY N/A 02/22/2020   Procedure: LEFT HEART CATH AND CORS/GRAFTS ANGIOGRAPHY;  Surgeon: Kathleene Hazel, MD;  Location: [facility] INVASIVE CV LAB;  Service: Cardiovascular;  Laterality: N/A;   TRIGGER FINGER RELEASE     b/l fingers all    ULNAR NERVE TRANSPOSITION Left 06/06/2021   Procedure: ULNAR NERVE DECOMPRESSION;  Surgeon: Cindee Salt, MD;  Location: [facility];  Service: Orthopedics;  Laterality: Left;  AXILLARY BLOCK   VITRECTOMY     b/l     Social History   Socioeconomic History   Marital status: Divorced    Spouse name: Not on file   Number of children: Not on file   Years of education: Not on file   Highest education level: Not on file  Occupational History   Not on file  Tobacco Use   Smoking status: Never    Passive exposure: Never   Smokeless tobacco: Never  Vaping Use   Vaping status: Never Used  Substance and Sexual Activity   Alcohol use: Yes    Comment: rarely 1 every 6 months   Drug use: No   Sexual activity: Not Currently    Partners: Male    Birth control/protection: None  Other Topics Concern   Not on file  Social History Narrative   Divorced. Has 2 kids(fraternal twins) daughters age 47. Works at  [employer], Never smoked, denies ETOH, no drugs. Drinks diet coke. No exercise.       DPR daughters Efraim Kaufmann and Tambi Odaniel twins          Social Determinants of Health   Financial Resource Strain: Not on file  Food Insecurity: No Food Insecurity (05/22/2022)   Received from [facility], [facility]   Hunger Vital Sign    Worried About Running Out of Food in the Last Year: Never true    Ran Out of Food in the Last Year: Never true  Transportation Needs: Not on file  Physical Activity: Not on file  Stress: Not on file  Social Connections: Unknown (05/09/2022)   Received from [facility], [facility]   Social Network    Social Network: Not on file  Intimate Partner Violence: Unknown (04/03/2022)   Received from [employer], [facility]   HITS    Physically Hurt: Not on file    Insult or Talk Down To: Not on file    Threaten Physical Harm: Not on file    Scream or Curse: Not on file    Family History  Problem Relation Age of Onset   Depression Mother    Anxiety disorder Mother    Colon polyps Father    Diabetes Father        2   Hypertension Father    Parkinson's disease Father    Stroke Paternal Grandmother    Heart disease Other    Hypertension Other    Hyperlipidemia Other    Depression Other    Migraines Other    Heart attack Neg Hx    Colon cancer Neg Hx    Stomach cancer Neg Hx    Esophageal cancer Neg Hx      Current Outpatient Medications:    albuterol (VENTOLIN HFA) 108 (90 Base) MCG/ACT inhaler, Inhale 2 puffs into the lungs every 4 (four) hours as needed for wheezing or shortness of breath., Disp: 6.7 g, Rfl: 0   Ascorbic Acid 500 MG CAPS, Take by mouth., Disp: , Rfl:    aspirin EC 81 MG tablet, Take 81 mg by mouth daily., Disp: , Rfl:    Blood Glucose Monitoring Suppl (FREESTYLE FREEDOM LITE) w/Device KIT, Use as directed by physician to check blood sugar, Disp: 1 kit, Rfl: 0   CHELATED MAGNESIUM PO, Take 250 mg by mouth daily., Disp: , Rfl:    clopidogrel (PLAVIX) 75 MG tablet, TAKE 1 TABLET BY MOUTH ONCE DAILY, Disp: 90  tablet, Rfl: 2   Continuous Blood Gluc Sensor (DEXCOM G6 SENSOR) MISC, Dispense and use as directed.  Change sensor every 10 days., Disp: 9 each, Rfl: 99   Continuous Blood Gluc Transmit (DEXCOM G6  TRANSMITTER) MISC, Dispense and use as directed.  Change transmitter every 90 days., Disp: 1 each, Rfl: 99   cyclobenzaprine (FLEXERIL) 10 MG tablet, Take 1 tablet (10 mg total) by mouth at bedtime as needed for muscle spasms., Disp: 30 tablet, Rfl: 2   EPINEPHrine 0.3 mg/0.3 mL IJ SOAJ injection, Inject 0.3 mg into the muscle as needed for anaphylaxis., Disp: 2 each, Rfl: 2   glucose blood (FREESTYLE LITE) test strip, Use to check blood sugar 3 time(s) daily, Disp: 100 each, Rfl: 99   insulin detemir (LEVEMIR) 100 UNIT/ML injection, Inject 36 units into the skin once daily in the event of insulin pump failure., Disp: 10 mL, Rfl: 0   insulin lispro (HUMALOG) 100 UNIT/ML injection, Inject 0.9 mLs (90 Units total) into the skin daily in insulin pump.., Disp: 30 mL, Rfl: 5   Insulin Syringe-Needle U-100 (BD INSULIN SYRINGE U/F) 31G X 5/16" 0.3 ML MISC, use with insulin injections 3 times daily in the event of insulin pump failure., Disp: 100 each, Rfl: 12   ipratropium (ATROVENT) 0.06 % nasal spray, Place 2 sprays into both nostrils 4 (four) times daily., Disp: 15 mL, Rfl: 12   isosorbide mononitrate (IMDUR) 60 MG 24 hr tablet, Take 1 tablet (60 mg total) by mouth daily., Disp: 90 tablet, Rfl: 3   ketoconazole (NIZORAL) 2 % shampoo, Apply 1 Application topically 3 (three) times a week. Wash scalp 3 times weekly, leave in for 10 minutes then rinse out, Disp: 120 mL, Rfl: 2   Krill Oil 500 MG CAPS, Take 500 mg by mouth daily., Disp: , Rfl:    Lancets (FREESTYLE) lancets, Use to check blood sugar 3 time(s) daily., Disp: 100 each, Rfl: 99   leflunomide (ARAVA) 20 MG tablet, Take 1 tablet (20 mg total) by mouth daily., Disp: 90 tablet, Rfl: 0   linaclotide (LINZESS) 290 MCG CAPS capsule, Take 1 capsule (290 mcg total) by mouth daily before breakfast., Disp: 90 capsule, Rfl: 3   losartan (COZAAR) 100 MG tablet, Take 1 tablet (100 mg total) by mouth daily., Disp: 90 tablet, Rfl: 3   metoprolol tartrate  (LOPRESSOR) 50 MG tablet, Take 0.5 tablets (25 mg total) by mouth 2 (two) times daily., Disp: 90 tablet, Rfl: 3   mupirocin ointment (BACTROBAN) 2 %, Apply 1 Application topically as directed. Bid to tid to open sores in scalp, Disp: 22 g, Rfl: 0   ondansetron (ZOFRAN) 4 MG tablet, Take 1 tablet (4 mg total) by mouth every 6 (six) hours as needed for nausea or vomiting., Disp: 90 tablet, Rfl: 3   pantoprazole (PROTONIX) 40 MG tablet, Take 1 tablet (40 mg total) by mouth 2 (two) times daily., Disp: 180 tablet, Rfl: 3   potassium chloride SA (KLOR-CON M20) 20 MEQ tablet, Take 1 tablet (20 mEq total) by mouth daily., Disp: 90 tablet, Rfl: 3   promethazine (PHENERGAN) 25 MG tablet, TAKE 1/2- 1 TABLETS (12.5-25 MG TOTAL) BY MOUTH 2 TIMES DAILY AS NEEDED FOR NAUSEA OR VOMITING., Disp: 60 tablet, Rfl: 2   rosuvastatin (CRESTOR) 20 MG tablet, Take 1 tablet (20 mg total) by mouth daily., Disp: 90 tablet, Rfl: 2   torsemide (DEMADEX) 20 MG tablet, Take 1 tablet (20 mg total) by mouth daily., Disp: 90 tablet, Rfl: 3  Upadacitinib ER (RINVOQ) 15 MG TB24, Take 1 tablet (15 mg) by mouth daily., Disp: 90 tablet, Rfl: 0   benzonatate (TESSALON) 100 MG capsule, Take 2 capsules (200 mg total) by mouth every 8 (eight) hours. (Patient not taking: Reported on 09/18/2023), Disp: 21 capsule, Rfl: 0  Current Facility-Administered Medications:    lidocaine (XYLOCAINE) 1 % (with pres) injection 6 mL, 6 mL, Other, Once, Lovorn, Megan, MD  Physical exam:  Vitals:   09/18/23 1515  BP: 108/65  Pulse: 93  Resp: 18  Temp: (!) 96.7 F (35.9 C)  TempSrc: Tympanic  SpO2: 98%  Weight: 178 lb 11.2 oz (81.1 kg)  Height: 5\' 3"  (1.6 m)   Physical Exam Cardiovascular:     Rate and Rhythm: Normal rate and regular rhythm.     Heart sounds: Normal heart sounds.  Pulmonary:     Effort: Pulmonary effort is normal.     Breath sounds: Normal breath sounds.  Abdominal:     General: Bowel sounds are normal.     Palpations:  Abdomen is soft.  Lymphadenopathy:     Comments: No palpable cervical, supraclavicular, axillary or inguinal adenopathy    Skin:    General: Skin is warm and dry.  Neurological:     Mental Status: She is alert and oriented to person, place, and time.         Latest Ref Rng & Units 09/12/2023    3:48 PM  CMP  Glucose 65 - 99 mg/dL 811   BUN 7 - 25 mg/dL 9   Creatinine 9.14 - 7.82 mg/dL 9.56   Sodium 213 - 086 mmol/L 138   Potassium 3.5 - 5.3 mmol/L 4.0   Chloride 98 - 110 mmol/L 101   CO2 20 - 32 mmol/L 28   Calcium 8.6 - 10.4 mg/dL 8.9   Total Protein 6.1 - 8.1 g/dL 6.0   Total Bilirubin 0.2 - 1.2 mg/dL 0.5   AST 10 - 35 U/L 22   ALT 6 - 29 U/L 19       Latest Ref Rng & Units 09/18/2023    2:47 PM  CBC  WBC 4.0 - 10.5 K/uL 3.4   Hemoglobin 12.0 - 15.0 g/dL 57.8   Hematocrit 46.9 - 46.0 % 33.5   Platelets 150 - 400 K/uL 188     No images are attached to the encounter.  ECHOCARDIOGRAM COMPLETE  Result Date: 09/16/2023    ECHOCARDIOGRAM REPORT   Patient Name:   [name] Date of Exam: 09/16/2023 Medical Rec #:  [id number]     Height:       63.0 in Accession #:    [id number]    Weight:       180.0 lb Date of Birth:  [birth year]    BSA:          1.849 m Patient Age:    56 years      BP:           110/60 mmHg Patient Gender: F             HR:           78 bpm. Exam Location:  [facility] Procedure: 2D Echo, Cardiac Doppler and Color Doppler Indications:     Shortness of breath                  CAD involving native coronary artery of native heart -without  angina pectoris I25.10  History:         Patient has prior history of Echocardiogram examinations, most                  recent 01/26/2021. Prior CABG; Risk Factors:Diabetes.  Sonographer:     Cristela Blue Referring Phys:  [phone] MICHAEL COOPER Diagnosing Phys: Lorine Bears MD  Sonographer Comments: Suboptimal apical window. IMPRESSIONS  1. Left ventricular ejection fraction, by estimation, is 55 to 60%. The left ventricle  has normal function. Left ventricular endocardial border not optimally defined to evaluate regional wall motion. There is mild left ventricular hypertrophy. Left ventricular diastolic parameters were normal.  2. Right ventricular systolic function is normal. The right ventricular size is normal. Tricuspid regurgitation signal is inadequate for assessing PA pressure.  3. The mitral valve is normal in structure. No evidence of mitral valve regurgitation. No evidence of mitral stenosis.  4. The aortic valve is normal in structure. Aortic valve regurgitation is not visualized. Aortic valve sclerosis is present, with no evidence of aortic valve stenosis.  5. The inferior vena cava is normal in size with greater than 50% respiratory variability, suggesting right atrial pressure of 3 mmHg. FINDINGS  Left Ventricle: Left ventricular ejection fraction, by estimation, is 55 to 60%. The left ventricle has normal function. Left ventricular endocardial border not optimally defined to evaluate regional wall motion. The left ventricular internal cavity size was normal in size. There is mild left ventricular hypertrophy. Left ventricular diastolic parameters were normal. Right Ventricle: The right ventricular size is normal. No increase in right ventricular wall thickness. Right ventricular systolic function is normal. Tricuspid regurgitation signal is inadequate for assessing PA pressure. Left Atrium: Left atrial size was normal in size. Right Atrium: Right atrial size was normal in size. Pericardium: There is no evidence of pericardial effusion. Mitral Valve: The mitral valve is normal in structure. No evidence of mitral valve regurgitation. No evidence of mitral valve stenosis. MV peak gradient, 3.2 mmHg. The mean mitral valve gradient is 1.0 mmHg. Tricuspid Valve: The tricuspid valve is normal in structure. Tricuspid valve regurgitation is not demonstrated. No evidence of tricuspid stenosis. Aortic Valve: The aortic valve is  normal in structure. Aortic valve regurgitation is not visualized. Aortic valve sclerosis is present, with no evidence of aortic valve stenosis. Aortic valve mean gradient measures 3.0 mmHg. Aortic valve peak gradient measures 4.8 mmHg. Aortic valve area, by VTI measures 2.27 cm. Pulmonic Valve: The pulmonic valve was normal in structure. Pulmonic valve regurgitation is not visualized. No evidence of pulmonic stenosis. Aorta: The aortic root is normal in size and structure. Venous: The inferior vena cava is normal in size with greater than 50% respiratory variability, suggesting right atrial pressure of 3 mmHg. IAS/Shunts: No atrial level shunt detected by color flow Doppler.  LEFT VENTRICLE PLAX 2D LVIDd:         3.60 cm   Diastology LVIDs:         2.40 cm   LV e' medial:    6.64 cm/s LV PW:         1.00 cm   LV E/e' medial:  13.3 LV IVS:        1.00 cm   LV e' lateral:   8.92 cm/s LVOT diam:     2.00 cm   LV E/e' lateral: 9.9 LV SV:         56 LV SV Index:   30 LVOT Area:     3.14 cm  RIGHT VENTRICLE RV Basal diam:  3.90 cm RV Mid diam:    3.10 cm RV S prime:     12.60 cm/s TAPSE (M-mode): 2.3 cm LEFT ATRIUM         Index       RIGHT ATRIUM           Index LA diam:    3.30 cm 1.78 cm/m  RA Area:     18.40 cm                                 RA Volume:   55.60 ml  30.07 ml/m  AORTIC VALVE AV Area (Vmax):    1.97 cm AV Area (Vmean):   2.05 cm AV Area (VTI):     2.27 cm AV Vmax:           109.50 cm/s AV Vmean:          75.250 cm/s AV VTI:            0.246 m AV Peak Grad:      4.8 mmHg AV Mean Grad:      3.0 mmHg LVOT Vmax:         68.70 cm/s LVOT Vmean:        49.000 cm/s LVOT VTI:          0.178 m LVOT/AV VTI ratio: 0.72  AORTA Ao Root diam: 2.10 cm MITRAL VALVE                TRICUSPID VALVE MV Area (PHT): 3.53 cm     TR Peak grad:   11.6 mmHg MV Area VTI:   2.18 cm     TR Vmax:        170.00 cm/s MV Peak grad:  3.2 mmHg MV Mean grad:  1.0 mmHg     SHUNTS MV Vmax:       0.90 m/s     Systemic VTI:  0.18 m  MV Vmean:      49.7 cm/s    Systemic Diam: 2.00 cm MV Decel Time: 215 msec MV E velocity: 88.00 cm/s MV A velocity: 103.00 cm/s MV E/A ratio:  0.85 Lorine Bears MD Electronically signed by Lorine Bears MD Signature Date/Time: 09/16/2023/3:51:47 PM    Final    DG Chest 2 View  Result Date: 08/30/2023 CLINICAL DATA:  Cough, shortness of breath EXAM: CHEST - 2 VIEW COMPARISON:  07/16/2023 FINDINGS: Mild central pulmonary vascular congestion. No confluent airspace disease. No effusion. Heart size and mediastinal contours are within normal limits. Aortic Atherosclerosis (ICD10-170.0). Post CABG. IMPRESSION: Mild central pulmonary vascular congestion. Electronically Signed   By: Corlis Leak M.D.   On: 08/30/2023 16:06     Assessment and plan- Patient is a 57 y.o. female referred for Anemia of chronic disease  Patient's baseline hemoglobin runs between 11-12.  Presently her hemoglobin is somewhat lower at 10.5 but on my repeat check today it was 11.1.  She has mild leukopenia likely secondary to her rheumatoid arthritis.  Platelet counts are normal.  Ferritin levels had 58 which can be sometimes unreliable to interpret in the setting of rheumatoid arthritis but her iron saturation is 20% with a normal serum iron and TIBC.  She is not low and her iron levels.  B12 and folate levels are presently normal.  I am not overtly concerned about her mild anemia at this time since this could all be explained  by chronic disease.  If there is a continued downward trend in her hemoglobin she can be referred back to [place].  She will be getting her CBCs checked regularly with rheumatology.  I will see her back in 1 year with labs   Visit Diagnosis 1. Normocytic anemia   2. Anemia of chronic disease      Dr. Owens Shark, MD, MPH [facility] [phone] 09/20/2023 8:50 AM

## 2023-09-23 ENCOUNTER — Ambulatory Visit: Payer: Commercial Managed Care - PPO | Admitting: Cardiovascular Disease

## 2023-09-26 ENCOUNTER — Ambulatory Visit: Payer: Commercial Managed Care - PPO | Admitting: Internal Medicine

## 2023-09-26 ENCOUNTER — Encounter: Payer: Self-pay | Admitting: Internal Medicine

## 2023-09-26 ENCOUNTER — Telehealth: Payer: Self-pay

## 2023-09-26 ENCOUNTER — Other Ambulatory Visit: Payer: Self-pay

## 2023-09-26 VITALS — BP 140/76 | HR 75 | Resp 20 | Ht 63.0 in | Wt 181.2 lb

## 2023-09-26 DIAGNOSIS — E0500 Thyrotoxicosis with diffuse goiter without thyrotoxic crisis or storm: Secondary | ICD-10-CM

## 2023-09-26 DIAGNOSIS — E1059 Type 1 diabetes mellitus with other circulatory complications: Secondary | ICD-10-CM | POA: Diagnosis not present

## 2023-09-26 DIAGNOSIS — E7849 Other hyperlipidemia: Secondary | ICD-10-CM

## 2023-09-26 DIAGNOSIS — E669 Obesity, unspecified: Secondary | ICD-10-CM | POA: Diagnosis not present

## 2023-09-26 LAB — POCT GLYCOSYLATED HEMOGLOBIN (HGB A1C): Hemoglobin A1C: 6.3 % — AB (ref 4.0–5.6)

## 2023-09-26 MED ORDER — FREESTYLE LITE TEST VI STRP
ORAL_STRIP | 3 refills | Status: DC
  Filled 2023-09-26: qty 100, 30d supply, fill #0
  Filled 2024-02-16: qty 100, 30d supply, fill #1
  Filled 2024-04-14: qty 100, 30d supply, fill #2
  Filled 2024-07-02 – 2024-07-15 (×2): qty 100, 30d supply, fill #3

## 2023-09-26 NOTE — Addendum Note (Signed)
Addended by: Tera Partridge on: 09/26/2023 03:23 PM   Modules accepted: Orders

## 2023-09-26 NOTE — Telephone Encounter (Signed)
[employer] Sensors ordered through [employer] at MD request

## 2023-09-26 NOTE — Progress Notes (Addendum)
Patient ID: [name], female   DOB: 1966/01/03, 57 y.o.   MRN: [id number]  HPI: [name] is a 57 y.o.-year-old female, referred by her previous endocrinologist, Dr. Morrison Old, for management of DM1, diagnosed at age 16, fairly well controlled, with complications (CAD, h/o AMI, DR) also h/o Graves disease.  DM1:  Reviewed HbA1c levels: 03/12/2023: HbA1c 6.5% Lab Results  Component Value Date   HGBA1C 7.0 (H) 05/15/2022   HGBA1C 6.8 (H) 01/11/2022   HGBA1C 6.7 08/22/2021   Insulin pump:  -Medtronic -Tandem t:slim X2 - since 2022  CGM: -Dexcom G6 >> would like to change to G7  Insulin: -Humalog  Supplies: -[place] for both pump and CGM  Pump settings: For days on: Basal rates for days on: 12 AM: 2.00 6 AM: 1.80 11 AM: 2.00  Insulin to carb ratios: 12 AM: 8  Insulin correction (sensitivity) factor: 12 AM: 10 6 AM: 9  Blood sugar target: 12 AM: 110   For days off: Basal rates for days off: 12 AM: 2.20 6 AM: 2.70  8 PM: 2.20  Insulin to carb ratios: 12 AM: 7.5  Insulin correction (sensitivity) factor: 12 AM: 20  Blood sugar target: 12 AM: 110  Active insulin time: 3 hrs  Total daily dose = 90.8 units (54% basal, 46% bolus)  - changes infusion site: q3 days TDD from basal insulin: 44% (42.08) TDD from bolus insulin: 56% (53.26) Total daily dose: 95-120 units a day She had Levemir 36 units daily as backup.  She was also on: - Ozempic 1 mg weekly - but severe reflux >> now off  Meter: Freestyle  Pt checks her sugars more than 4 times a day with her Dexcom CGM:     Lowest sugar was 40; she has hypoglycemia awareness at 70.  She has a glucagon kit at home. No previous hypoglycemia admission.  Highest sugar was 300.+ previous DKA admissions - 2002.    Pt's meals are: - Breakfast: skips or snack - Lunch: apple, cheese with yoghurt - snack: 3 pm - nuts, fruit - Dinner: cauliflower with cheese, beans, rice - Snacks: no sweet drinks; drinks  diet coke  - no CKD, last BUN/creatinine:  Lab Results  Component Value Date   BUN 9 09/12/2023   BUN 15 06/12/2023   CREATININE 0.80 09/12/2023   CREATININE 0.92 06/12/2023  Urine microalbumin/Cr ratio: <5 mg/L (09/17/22)  No results found for: "MICRALBCREAT" She is on Cozaar 25 mg daily.  - + HL; last set of lipids: Lab Results  Component Value Date   CHOL 133 12/05/2022   HDL 62.40 12/05/2022   LDLCALC 52 12/05/2022   TRIG 95.0 12/05/2022   CHOLHDL 2 12/05/2022  On Crestor 10 mg daily along with omega-3 fatty acids.  - last eye exam was in 12/2022: + PDR OU. Dr. Ashley Royalty.  - no numbness and tingling in her feet.  Last TSH: Lab Results  Component Value Date   TSH 1.34 06/12/2023   Pt has FH of DMs in father.  Graves ds.:  - Per review of Dr. Tonna Boehringer note, she was on methimazole in 2020 and stopped by herself in 2021.  Subsequent TFTs were normal: Lab Results  Component Value Date   TSH 1.34 06/12/2023   TSH 1.04 03/14/2023   TSH 1.02 12/05/2022   TSH 1.03 05/15/2022   TSH 1.07 01/11/2022   TSH 0.88 12/15/2020   TSH 0.77 05/23/2020   TSH <0.01 Repeated and verified X2. (L) 01/04/2020  TSH 7.635 (H) 08/12/2019   TSH 0.010 (L) 11/19/2018   TSH 1.22 01/28/2017   TSH 3.666 05/08/2016   TSH 0.02 (L) 11/08/2010   Pt denies: - feeling nodules in neck - hoarseness - dysphagia - choking  She also has a history of RA, psoriasis, and OSA.  At last visit with Dr. Morrison Old she was off the CPAP and he recommended to restart it. She has a history of chronic pain syndrome.  ROS: No increased urination, blurry vision, nausea, chest pain.  Past Medical History:  Diagnosis Date   ACUT MI ANTEROLAT WALL SUBSQT EPIS CARE    Acute maxillary sinusitis    ALLERGIC RHINITIS, SEASONAL    Anemia    ARTHRITIS, RHEUMATOID    shoulders and hands Enbrel>leg swelling Dr. Corliss Skains   Atrial fibrillation [facility])    a. after CABG.   Back pain    Bruxism (teeth grinding)     CAD, ARTERY BYPASS GRAFT    a. DES to RCA in 2010 then LAD occlusion s/p CABG 3 06/07/2009 with LIMA to LAD, reverse SVG to D1, reverse SVG to distal RCA. b. Cath 05/08/2016 slightly hypodense region in the intermediate branch, however she had excellent flow, FFR was normal. Vein graft to PDA and the posterolateral branch is patent, patent LIMA to LAD, occluded SVG to diagonal.   CARPAL TUNNEL SYNDROME, BILATERAL    CHOLELITHIASIS    Contrast media allergy    DERMATITIS, ALLERGIC    DIABETES MELLITUS, TYPE I    on insulin pump dx'ed age 54 y.o    DIABETIC  RETINOPATHY    Hiatal hernia    HYPERLIPIDEMIA-MIXED    HYPERTHYROIDISM    Dr. Osborne Casco   IDA (iron deficiency anemia)    Insulin pump in place    Lymphadenopathy of head and neck    MIGRAINE W/O AURA W/INTRACT W/STATUS MIGRAINOSUS 02/19/2008   PONV (postoperative nausea and vomiting)    Psoriasis    SINUS TACHYCARDIA 11/08/2010   Sleep apnea    not using machine yet    SVT (supraventricular tachycardia)    after s/p CABG   Tendonitis    TRIGGER FINGER    all fingers b/l hands    URI    Past Surgical History:  Procedure Laterality Date   ABDOMINAL HYSTERECTOMY     endometriomas b/l; total ? cervix removed; no h/o abnormal pap   Caesarean section     CARDIAC CATHETERIZATION N/A 05/08/2016   Procedure: Left Heart Cath and Cors/Grafts Angiography;  Surgeon: Kathleene Hazel, MD;  Location[facility] INVASIVE CV LAB;  Service: Cardiovascular;  Laterality: N/A;   CARPAL TUNNEL RELEASE     b/l    CARPAL TUNNEL RELEASE Left 06/06/2021   Procedure: CARPAL TUNNEL RELEASE;  Surgeon: Cindee Salt, MD;  Location: [facility];  Service: Orthopedics;  Laterality: Left;  AXILLARY BLOCK   CATARACT EXTRACTION, BILATERAL     CHOLECYSTECTOMY     COLONOSCOPY WITH PROPOFOL N/A 03/25/2020   Procedure: COLONOSCOPY WITH PROPOFOL;  Surgeon: Wyline Mood, MD;  Location: [facility] ENDOSCOPY;  Service: Gastroenterology;  Laterality: N/A;    CORONARY ARTERY BYPASS GRAFT     ESOPHAGOGASTRODUODENOSCOPY (EGD) WITH PROPOFOL N/A 03/25/2020   Procedure: ESOPHAGOGASTRODUODENOSCOPY (EGD) WITH PROPOFOL;  Surgeon: Wyline Mood, MD;  Location: [facility] ENDOSCOPY;  Service: Gastroenterology;  Laterality: N/A;   EYE SURGERY     laser x 2 for retinopathy    LEFT HEART CATH AND CORS/GRAFTS ANGIOGRAPHY N/A 02/22/2020   Procedure: LEFT  HEART CATH AND CORS/GRAFTS ANGIOGRAPHY;  Surgeon: Kathleene Hazel, MD;  Location: [facility] INVASIVE CV LAB;  Service: Cardiovascular;  Laterality: N/A;   TRIGGER FINGER RELEASE     b/l fingers all    ULNAR NERVE TRANSPOSITION Left 06/06/2021   Procedure: ULNAR NERVE DECOMPRESSION;  Surgeon: Cindee Salt, MD;  Location: [facility];  Service: Orthopedics;  Laterality: Left;  AXILLARY BLOCK   VITRECTOMY     b/l    Social History   Socioeconomic History   Marital status: Divorced    Spouse name: Not on file   Number of children: Not on file   Years of education: Not on file   Highest education level: Not on file  Occupational History   Not on file  Tobacco Use   Smoking status: Never    Passive exposure: Never   Smokeless tobacco: Never  Vaping Use   Vaping status: Never Used  Substance and Sexual Activity   Alcohol use: Yes    Comment: rarely 1 every 6 months   Drug use: No   Sexual activity: Not Currently    Partners: Male    Birth control/protection: None  Other Topics Concern   Not on file  Social History Narrative   Divorced. Has 2 kids(fraternal twins) daughters age 13. Works at  [employer], Never smoked, denies ETOH, no drugs. Drinks diet coke. No exercise.       DPR daughters Efraim Kaufmann and Bertine Tersigni twins          Social Determinants of Health   Financial Resource Strain: Not on file  Food Insecurity: No Food Insecurity (05/22/2022)   Received from [facility], [facility]   Hunger Vital Sign    Worried About Running Out of Food in the Last Year: Never true    Ran Out  of Food in the Last Year: Never true  Transportation Needs: Not on file  Physical Activity: Not on file  Stress: Not on file  Social Connections: Unknown (05/09/2022)   Received from [facility], [facility]   Social Network    Social Network: Not on file  Intimate Partner Violence: Unknown (04/03/2022)   [facility] from [facility], [facility]   HITS    Physically Hurt: Not on file    Insult or Talk Down To: Not on file    Threaten Physical Harm: Not on file    Scream or Curse: Not on file   Current Outpatient Medications on File Prior to Visit  Medication Sig Dispense Refill   albuterol (VENTOLIN HFA) 108 (90 Base) MCG/ACT inhaler Inhale 2 puffs into the lungs every 4 (four) hours as needed for wheezing or shortness of breath. 6.7 g 0   Ascorbic Acid 500 MG CAPS Take by mouth.     aspirin EC 81 MG tablet Take 81 mg by mouth daily.     benzonatate (TESSALON) 100 MG capsule Take 2 capsules (200 mg total) by mouth every 8 (eight) hours. (Patient not taking: Reported on 09/18/2023) 21 capsule 0   Blood Glucose Monitoring Suppl (FREESTYLE FREEDOM LITE) w/Device KIT Use as directed by physician to check blood sugar 1 kit 0   CHELATED MAGNESIUM PO Take 250 mg by mouth daily.     clopidogrel (PLAVIX) 75 MG tablet TAKE 1 TABLET BY MOUTH ONCE DAILY 90 tablet 2   Continuous Blood Gluc Sensor (DEXCOM G6 SENSOR) MISC Dispense and use as directed.  Change sensor every 10 days. 9 each 99   Continuous Blood  Gluc Transmit (DEXCOM G6 TRANSMITTER) MISC Dispense and use as directed.  Change transmitter every 90 days. 1 each 99   cyclobenzaprine (FLEXERIL) 10 MG tablet Take 1 tablet (10 mg total) by mouth at bedtime as needed for muscle spasms. 30 tablet 2   EPINEPHrine 0.3 mg/0.3 mL IJ SOAJ injection Inject 0.3 mg into the muscle as needed for anaphylaxis. 2 each 2   glucose blood (FREESTYLE LITE) test strip Use to check blood sugar 3 time(s) daily 100 each 99   insulin detemir (LEVEMIR) 100 UNIT/ML  injection Inject 36 units into the skin once daily in the event of insulin pump failure. 10 mL 0   insulin lispro (HUMALOG) 100 UNIT/ML injection Inject 0.9 mLs (90 Units total) into the skin daily in insulin pump.. 30 mL 5   Insulin Syringe-Needle U-100 (BD INSULIN SYRINGE U/F) 31G X 5/16" 0.3 ML MISC use with insulin injections 3 times daily in the event of insulin pump failure. 100 each 12   ipratropium (ATROVENT) 0.06 % nasal spray Place 2 sprays into both nostrils 4 (four) times daily. 15 mL 12   isosorbide mononitrate (IMDUR) 60 MG 24 hr tablet Take 1 tablet (60 mg total) by mouth daily. 90 tablet 3   ketoconazole (NIZORAL) 2 % shampoo Apply 1 Application topically 3 (three) times a week. Wash scalp 3 times weekly, leave in for 10 minutes then rinse out 120 mL 2   Krill Oil 500 MG CAPS Take 500 mg by mouth daily.     Lancets (FREESTYLE) lancets Use to check blood sugar 3 time(s) daily. 100 each 99   leflunomide (ARAVA) 20 MG tablet Take 1 tablet (20 mg total) by mouth daily. 90 tablet 0   linaclotide (LINZESS) 290 MCG CAPS capsule Take 1 capsule (290 mcg total) by mouth daily before breakfast. 90 capsule 3   losartan (COZAAR) 100 MG tablet Take 1 tablet (100 mg total) by mouth daily. 90 tablet 3   metoprolol tartrate (LOPRESSOR) 50 MG tablet Take 0.5 tablets (25 mg total) by mouth 2 (two) times daily. 90 tablet 3   mupirocin ointment (BACTROBAN) 2 % Apply 1 Application topically as directed. Bid to tid to open sores in scalp 22 g 0   ondansetron (ZOFRAN) 4 MG tablet Take 1 tablet (4 mg total) by mouth every 6 (six) hours as needed for nausea or vomiting. 90 tablet 3   pantoprazole (PROTONIX) 40 MG tablet Take 1 tablet (40 mg total) by mouth 2 (two) times daily. 180 tablet 3   potassium chloride SA (KLOR-CON M20) 20 MEQ tablet Take 1 tablet (20 mEq total) by mouth daily. 90 tablet 3   promethazine (PHENERGAN) 25 MG tablet TAKE 1/2- 1 TABLETS (12.5-25 MG TOTAL) BY MOUTH 2 TIMES DAILY AS NEEDED  FOR NAUSEA OR VOMITING. 60 tablet 2   rosuvastatin (CRESTOR) 20 MG tablet Take 1 tablet (20 mg total) by mouth daily. 90 tablet 2   torsemide (DEMADEX) 20 MG tablet Take 1 tablet (20 mg total) by mouth daily. 90 tablet 3   Upadacitinib ER (RINVOQ) 15 MG TB24 Take 1 tablet (15 mg) by mouth daily. 90 tablet 0   Current Facility-Administered Medications on File Prior to Visit  Medication Dose Route Frequency Provider Last Rate Last Admin   lidocaine (XYLOCAINE) 1 % (with pres) injection 6 mL  6 mL Other Once Lovorn, Megan, MD       Allergies  Allergen Reactions   Actemra [Tocilizumab]    Ramipril Swelling, Rash and  Other (See Comments)   Shellfish-Derived Products Swelling    Shrimp    Atorvastatin Rash    Elevated LFT's   Compazine  [Prochlorperazine Edisylate] Other (See Comments)    Neurological reaction   Emgality [Galcanezumab-Gnlm] Hives and Swelling   Etanercept Swelling and Rash   Infliximab Rash   Iohexol     Iv contrast dye -rash all over   Orencia [Abatacept] Rash   Prochlorperazine Edisylate     unknown   Tofacitinib Rash and Other (See Comments)    Severe abdominal pain    Trokendi Engineer, civil (consulting) Er]     [employer], memory issues, word finding issues   Tramadol     Nausea    Amiodarone Nausea Only   Rituximab Rash    Causes a rash   Family History  Problem Relation Age of Onset   Depression Mother    Anxiety disorder Mother    Colon polyps Father    Diabetes Father        2   Hypertension Father    Parkinson's disease Father    Stroke Paternal Grandmother    Heart disease Other    Hypertension Other    Hyperlipidemia Other    Depression Other    Migraines Other    Heart attack Neg Hx    Colon cancer Neg Hx    Stomach cancer Neg Hx    Esophageal cancer Neg Hx    PE: BP (!) 140/76 (BP Location: Right Arm, Patient Position: Sitting, Cuff Size: Normal)   Pulse 75   Resp 20   Ht 5\' 3"  (1.6 m)   Wt 181 lb 3.2 oz (82.2 kg)   SpO2 99%   BMI 32.10  kg/m  Wt Readings from Last 3 Encounters:  09/26/23 181 lb 3.2 oz (82.2 kg)  09/18/23 178 lb 11.2 oz (81.1 kg)  08/30/23 180 lb (81.6 kg)   Constitutional: overweight, in NAD Eyes:  EOMI, no exophthalmos ENT: no neck masses, no cervical lymphadenopathy Cardiovascular: RRR, No MRG Respiratory: CTA B Musculoskeletal: no deformities Skin:no rashes Neurological: no tremor with outstretched hands  ASSESSMENT: 1. DM1, controlled, with complications - CAD, h/o AMI - at 49, s/p stent, s/p CABG - DR, s/p vitrectomy  2. HL  3.  Graves' disease, in remission  4. Obesity class 1  PLAN:  1. Patient with long-standing, fairly well-controlled type 1 diabetes.  She was previously on Medtronic insulin pump for years but currently on the t:slim X2 insulin pump integrated with the Dexcom G6 CGM starting in 2022.  Latest HbA1c available for review was 6.5% on 03/12/2023.  At today's visit, we checked another HbA1c: 6.3% (improved). CGM interpretation: -At today's visit, we reviewed her CGM downloads: It appears that 81% of values are in target range (goal >70%), while 16% are higher than 180 (goal <25%), and 3% are lower than 70 (goal <4%).  The calculated average blood sugar is 134.  The projected HbA1c for the next 3 months (GMI) is lower than 7%. -Reviewing the CGM trends, sugars appear to be fluctuating mostly with range with occasional higher blood sugars after lunch and less so after dinner.  She does have occasional lows down to the 40s and reviewing the pump downloads it appears that this may be related to taking insulin too late after the meals.  We did discuss about trying to inject insulin 15 minutes before each meal but if the low blood sugars continue after she does this, we discussed about relaxing  her insulin to carb ratios.  She has 2 sets of pump settings, one for when she is not working (2 days a week), and one when she is working (5 days a week).  As she has stronger correction factors  when she is working, I advised her to relax these, to avoid hypoglycemia. -Otherwise, I did not suggest any other changes in her regimen. - I suggested to: Patient Instructions  Please use the following pump settings: For days on: Basal rates for days on: 12 AM: 2.00 6 AM: 1.80 11 AM: 2.00  Insulin to carb ratios: 12 AM: 8 (may need to change to 9)  Insulin correction (sensitivity) factor: 12 AM: 10 >> 20 6 AM: 9 >> 20  Blood sugar target: 12 AM: 110   For days off: Basal rates for days off: 12 AM: 2.20 6 AM: 2.70  8 PM: 2.20  Insulin to carb ratios: 12 AM: 7.5 (may need to change to 8.5)  Insulin correction (sensitivity) factor: 12 AM: 20  Blood sugar target: 12 AM: 110  Active insulin time: 3 hrs  Please return in 3 to 4 months.  - continue checking sugars at different times of the day - check at least 4 times a day, rotating checks it - given foot care handout and explained the principles  - given instructions for hypoglycemia management "15-15 rule"  - advised for yearly eye exams - advised to get ketone strips - advised to always have Glu tablets with her - given instruction Re: exercising and driving in DM1 (pt instructions) - also, given information about sick day rules - will check an ACR today - Return to clinic in 3-4 mo   2. HL - Reviewed latest lipid panel from 11/2022: All fractions at goal: Lab Results  Component Value Date   CHOL 133 12/05/2022   HDL 62.40 12/05/2022   LDLCALC 52 12/05/2022   TRIG 95.0 12/05/2022   CHOLHDL 2 12/05/2022  - Continues Crestor 10 mg daily, omega-3 fatty acids, without side effects - Will repeat a lipid panel at next visit  3. Graves' disease, in remission -Reviewed previous TFTs: Latest levels have been normal since 2021 -She continues off methimazole -No signs or symptoms of hyperthyroidism at today's visit -Will continue to keep an eye on her TFTs -will repeat these at next visit  4.  Obesity class  I -She was not able to tolerate Ozempic due to acid reflux -Reviewing her diet today, she appears to have smaller meals, so I would not recommend to decrease them even more, but redistribute the meals with a lighter meal at night. -I did suggest a referral to the weight management clinic and we discussed about diet different plans.  She prefers a vegetarian diet.  Component     Latest Ref Rng 09/26/2023  Microalb, Ur     0.0 - 1.9 mg/dL <1.6   Creatinine,U     mg/dL 10.9   MICROALB/CREAT RATIO     0.0 - 30.0 mg/g 3.8   ACR normal.  Carlus Pavlov, MD PhD [facility] Endocrinology

## 2023-09-26 NOTE — Patient Instructions (Addendum)
Please use the following pump settings: For days on: Basal rates for days on: 12 AM: 2.00 6 AM: 1.80 11 AM: 2.00  Insulin to carb ratios: 12 AM: 8 (may need to change to 9)  Insulin correction (sensitivity) factor: 12 AM: 10 >> 20 6 AM: 9 >> 20  Blood sugar target: 12 AM: 110   For days off: Basal rates for days off: 12 AM: 2.20 6 AM: 2.70  8 PM: 2.20  Insulin to carb ratios: 12 AM: 7.5 (may need to change to 8.5)  Insulin correction (sensitivity) factor: 12 AM: 20  Blood sugar target: 12 AM: 110  Active insulin time: 3 hrs  Please return in 3 to 4 months.  Basic Rules for Patients with Type I Diabetes Mellitus  The American Diabetes Association (ADA) recommended targets: - fasting sugar 80-130 - after meal sugar <180 - HbA1C <7%  Engage in >=150 min moderate exercise per week  Make sure you have >=8h of sleep every night as this helps both blood sugars and your weight.  Always keep a sugar log (not only record in your meter) and bring it to all appointments with [place].  If you are on a pump, know how to access the settings and to modify the parameters.   Remember, you can always call the number on the back of the pump for emergencies related to the pump.  "15-15 rule" for hypoglycemia: if sugars are low, take 15 g of carbs** ("fast sugar" - e.g. 4 glucose tablets, 4 oz orange juice), wait 15 min, then check sugars again. If still <80, repeat. Continue  until your sugars >80, then eat a normal meal.   Teach family members and coworkers to inject glucagon. Have a glucagon set at home and one at work. They should call 911 after using the set.  If you are on a pump, set "insulin on board" time for 5 hours (if your sugars tend to be higher, can use 4 hours).   If you are on a pump, use the "dual wave bolus" setting for high fat foods (e.g. pizza). Start with a setting of 50%-50% (50% instant bolus and 50% prolonged bolus over 3h, for e.g.).    If you are on a  pump, make sure the basal daily insulin dose is approximately equal (not larger) to the daily insulin you get from boluses, otherwise you are at risk for hypoglycemia.  Check sugar before driving. If <100, correct, and only start driving if sugars rise >=161. Check sugar every hour when on a long drive.  Check sugar before exercising. If <100, correct, and only start exercising if sugars rise >=100. Check sugar every hour when on a long exercise routine and 1h after you finished exercising.   If >250, check urine for ketones. If you have moderate-large ketones in urine, do not start exercise. Hydrate yourself with clear liquids and correct the high sugar. Recheck sugars and ketones before attempting to exercise.  Be aware that you might need less insulin when exercising.  *intense, short, exercise bursts can increase your sugars, but  *less intense, longer (>1h), exercise routines can decrease your sugars.  If you are on a pump, you might need to decrease your basal rate by 10% or more (or even disconnect your pump) while you exercise to prevent low sugars. Do not disconnect your pump by more than 3 hours at a time! You also might need to decrease your insulin bolus for the meal prior to your exercise  time by 20% or more.  Make sure you have a MedAlert bracelet or pendant mentioning "Type I Diabetes Mellitus". If you have a prior episode of severe hypoglycemia or hypoglycemia unawareness, it should also mention this.  Please do not walk barefoot. Inspect your feet for sores/cuts and let us know if you have them.  **E.g. of "fast carbs": first choice (15 g):  1 tube glucose gel, GlucoPouch 15, 2 oz glucose liquid second choice (15-16 g):  3 or 4 glucose tablets (best taken  with water), 15 Dextrose Bits chewable third choice (15-20 g):   cup fruit juice,  cup regular soda, 1 cup skim milk,  1 cup sports drink fourth choice (15-20 g):  1 small tube Cakemate gel (not frosting), 2 tbsp  raisins, 1 tbsp table sugar,  candy, jelly beans, gum drops - check package for carb amount   (adapted from: Juluis Rainier. "Insulin therapy and hypoglycemia" Endocrinol Metab Clin N Am 2012, 41: 57-87)  Sick Day Rules for Diabetes  Think S-K-I-L-L:  Sugars:  - if glucose >200, check every 3h and drink sugar free liquids  - if glucose <200, drink carb-containing liquids and recheck 30 min later  - if glucose high, correct with insulin  - if sugars <60, initiate hypoglycemia management (take 15 g of fast carbs and check sugars in 15 min  - repeat until sugars remain >100).  Ketones:  When to check ketones?  When glucose >300 x2 if on insulin injections (>300 x 1 if on insulin pump). When nausea, vomiting, diarrhea, abdominal pain, headache, fever - even if glucose is normal or low - because in this case, you need both glucose and insulin.    - if you have ketone strips for blood >> if ketones are more or equal than 0.6, need to increase insulin - if you have ketone strips for urine >> if ketones are more or equal than "small", need to increase insulin  Insulin: Never skip long acting insulin, even if not eating!   Urine ketones Blood ketones Extra insulin?  no <0.6 no  small 0.6-1.5 Increase dose by 5%  moderate 1.5-3 Increase dose by 10%  large >3 Increase dose by at least 20%   Liquids: - if glucose >200, check every 3h and drink sugar free liquids  - if glucose <200, small sips of carb-containing liquids (e.g. Ginger ale, Gatorade, juice, etc.)  Let us know!   Call us if: Go to ED if: Call your primary care doctor if:  Sugars >300 for >8h Severe abdominal pain Fever >100F for 24h  Moderate to large  urine ketones or blood ketones >1.5 Difficulty breathing Other chronic diseases flaring up  Vomiting and unable to keep liquids down Signs of dehydration

## 2023-09-27 ENCOUNTER — Other Ambulatory Visit: Payer: Self-pay

## 2023-09-27 LAB — MICROALBUMIN / CREATININE URINE RATIO
Creatinine,U: 18.4 mg/dL
Microalb Creat Ratio: 3.8 mg/g (ref 0.0–30.0)
Microalb, Ur: <0.7 mg/dL (ref 0.0–1.9)

## 2023-10-01 ENCOUNTER — Other Ambulatory Visit: Payer: Self-pay

## 2023-10-01 NOTE — Progress Notes (Signed)
Specialty Pharmacy Refill Coordination Note  [name] is a 57 y.o. female contacted today regarding refills of specialty medication(s) Upadacitinib .  Patient requested Delivery  on 10/07/23  to verified address [address]  [name] [address]   Medication will be filled on 10/04/23.

## 2023-10-03 ENCOUNTER — Telehealth: Payer: Self-pay | Admitting: Pharmacist

## 2023-10-03 NOTE — Telephone Encounter (Signed)
Submitted a Prior Authorization renewal request to [facility] for RINVOQ via CoverMyMeds. Will update once we receive a response.  Key: [id number]

## 2023-10-04 ENCOUNTER — Other Ambulatory Visit: Payer: Self-pay

## 2023-10-04 ENCOUNTER — Other Ambulatory Visit (HOSPITAL_COMMUNITY): Payer: Self-pay

## 2023-10-04 ENCOUNTER — Encounter: Payer: Self-pay | Admitting: Internal Medicine

## 2023-10-05 DIAGNOSIS — E10319 Type 1 diabetes mellitus with unspecified diabetic retinopathy without macular edema: Secondary | ICD-10-CM | POA: Diagnosis not present

## 2023-10-07 ENCOUNTER — Other Ambulatory Visit: Payer: Self-pay

## 2023-10-07 NOTE — Telephone Encounter (Signed)
Received notification from [facility] regarding a prior authorization for [facility]. Authorization has been APPROVED from 10/03/2023 to 10/02/2024. Approval letter sent to scan center.  Patient must continue to fill through [facility]: [phone]   Authorization # [id number] Phone # [phone]

## 2023-10-10 ENCOUNTER — Other Ambulatory Visit: Payer: Self-pay | Admitting: Rheumatology

## 2023-10-11 ENCOUNTER — Other Ambulatory Visit: Payer: Self-pay | Admitting: Rheumatology

## 2023-10-11 ENCOUNTER — Other Ambulatory Visit: Payer: Self-pay

## 2023-10-11 MED FILL — Cyclobenzaprine HCl Tab 10 MG: ORAL | 30 days supply | Qty: 30 | Fill #0 | Status: AC

## 2023-10-11 NOTE — Telephone Encounter (Signed)
Last Fill: 07/11/2023   Next Visit: 12/10/2023   Last Visit: 06/13/2023   Dx: Other insomnia    Current Dose per office note on 06/13/2023: Flexeril 10 mg at bedtime for muscle spasms and insomnia.    Okay to refill Flexeril?

## 2023-10-11 NOTE — Telephone Encounter (Signed)
Last Fill: 07/11/2023  Next Visit: 12/10/2023  Last Visit: 06/13/2023  Dx: DDD (degenerative disc disease), lumbar   Current Dose per office note on 06/13/2023:  flexeril 10 mg 1 tablet by mouth at bedtime for muscle spasms and insomnia.    Okay to refill Flexeril?

## 2023-10-21 DIAGNOSIS — E10319 Type 1 diabetes mellitus with unspecified diabetic retinopathy without macular edema: Secondary | ICD-10-CM | POA: Diagnosis not present

## 2023-10-28 ENCOUNTER — Encounter
Payer: Commercial Managed Care - PPO | Attending: Physical Medicine and Rehabilitation | Admitting: Physical Medicine and Rehabilitation

## 2023-10-28 ENCOUNTER — Encounter: Payer: Self-pay | Admitting: Physical Medicine and Rehabilitation

## 2023-10-28 VITALS — BP 136/79 | HR 87 | Ht 63.0 in | Wt 183.0 lb

## 2023-10-28 DIAGNOSIS — G8929 Other chronic pain: Secondary | ICD-10-CM | POA: Insufficient documentation

## 2023-10-28 DIAGNOSIS — M7918 Myalgia, other site: Secondary | ICD-10-CM | POA: Insufficient documentation

## 2023-10-28 DIAGNOSIS — G894 Chronic pain syndrome: Secondary | ICD-10-CM | POA: Insufficient documentation

## 2023-10-28 DIAGNOSIS — M5442 Lumbago with sciatica, left side: Secondary | ICD-10-CM | POA: Diagnosis not present

## 2023-10-28 MED ORDER — LIDOCAINE HCL 1 % IJ SOLN
6.0000 mL | Freq: Once | INTRAMUSCULAR | Status: AC
Start: 2023-10-28 — End: 2023-10-28
  Administered 2023-10-28: 6 mL

## 2023-10-28 MED ORDER — NALTREXONE HCL 50 MG PO TABS: 25.0000 mg | ORAL_TABLET | Freq: Every day | ORAL | Status: DC

## 2023-10-28 NOTE — Patient Instructions (Signed)
Plan: Ask them to have a CT done of chest- since has been having unknown reason for cough- for months- won't improve- has allergic asthma, but not the same stuff.    2. Use you theracane at least 3 days/week-    3. Patient here for trigger point injections for  Consent done and on chart.  Cleaned areas with alcohol and injected using a 27 gauge 1.5 inch needle  Injected 6cc- none wasted Using 1% Lidocaine with no EPI  Upper traps- B/L  Levators- B/L  Posterior scalenes Middle scalenes- b/L  Splenius Capitus Pectoralis Major Rhomboids- B/L x2 Infraspinatus Teres Major/minor Thoracic paraspinals Lumbar paraspinals- B/L x2 Other injections-    Patient's level of pain prior was 7/10 Current level of pain after injections is same as when came in  There was no bleeding or complications.  Patient was advised to drink a lot of water on day after injections to flush system Will have increased soreness for 12-48 hours after injections.  Can use Lidocaine patches the day AFTER injections Can use theracane on day of injections in places didn't inject Can use heating pad 4-6 hours AFTER injections   4. Low dose Naltrexone-  3 mg nightly x 1 week, then 6 mg nightly- [facility]- [phone] [address]  - sent in 3 mg at bedtime- x 1 week, then another Rx- 6 mg nightly- for nerve/FMS pain.   - mild chance of anxiety and sedation.   5. F/U in 3 months-  and q6 weeks-  Wait list for appointment around 6 weeks

## 2023-10-28 NOTE — Progress Notes (Signed)
Pt is a 57 yr old female with hx of RA, (-) rh factor, thoracic DDD, DM with retinopathy- Ac 6.5, migraines without aura, and MI/CABG in past-  Also has Graves disease; and just dx'd with tendinitis in B/L hamstring tendinosis- s/p recent R injection (due to DM only did one)- no CKD   Here for f/u of chronic pain.    Had COVID last appointment that she missed- because of COVID and got pneumonia-  Still has cough- worse with COVID, but had before covid- Every AM, coughs negative.   TrP injections helped 6+ weeks-  Saw 4 months ago-   Still having a hard time sleeping due to pain.  Pain worse in AM and at night.   Bene using patches again, but only somewhat helpful.     Plan: Ask them to have a CT done of chest- since has been having unknown reason for cough- for months- won't improve- has allergic asthma, but not the same stuff.    2. Use you theracane at least 3 days/week-    3. Patient here for trigger point injections for  Consent done and on chart.  Cleaned areas with alcohol and injected using a 27 gauge 1.5 inch needle  Injected 6cc- none wasted Using 1% Lidocaine with no EPI  Upper traps- B/L  Levators- B/L  Posterior scalenes Middle scalenes- b/L  Splenius Capitus Pectoralis Major Rhomboids- B/L x2 Infraspinatus Teres Major/minor Thoracic paraspinals Lumbar paraspinals- B/L x2 Other injections-    Patient's level of pain prior was 7/10 Current level of pain after injections is same as when came in  There was no bleeding or complications.  Patient was advised to drink a lot of water on day after injections to flush system Will have increased soreness for 12-48 hours after injections.  Can use Lidocaine patches the day AFTER injections Can use theracane on day of injections in places didn't inject Can use heating pad 4-6 hours AFTER injections   4. Low dose Naltrexone-  3 mg nightly x 1 week, then 6 mg nightly- [facility]- [phone]  - sent in 3 mg at bedtime- x 1 week, then another Rx- 6 mg nightly- for nerve/FMS pain.   - mild chance of anxiety and sedation.   5. F/U in 3 months-  and q6 weeks-  Wait list for appointment around 6 weeks    I spent a total of  26  minutes on total care today- >50% coordination of care- due to 6 minutes injections- [facility] and also discussed options for nerve pain

## 2023-10-29 DIAGNOSIS — H52213 Irregular astigmatism, bilateral: Secondary | ICD-10-CM | POA: Diagnosis not present

## 2023-10-29 DIAGNOSIS — E103553 Type 1 diabetes mellitus with stable proliferative diabetic retinopathy, bilateral: Secondary | ICD-10-CM | POA: Diagnosis not present

## 2023-10-29 DIAGNOSIS — Z9841 Cataract extraction status, right eye: Secondary | ICD-10-CM | POA: Diagnosis not present

## 2023-10-29 DIAGNOSIS — Z9842 Cataract extraction status, left eye: Secondary | ICD-10-CM | POA: Diagnosis not present

## 2023-10-29 DIAGNOSIS — H5212 Myopia, left eye: Secondary | ICD-10-CM | POA: Diagnosis not present

## 2023-10-29 DIAGNOSIS — H40053 Ocular hypertension, bilateral: Secondary | ICD-10-CM | POA: Diagnosis not present

## 2023-10-29 DIAGNOSIS — H40023 Open angle with borderline findings, high risk, bilateral: Secondary | ICD-10-CM | POA: Diagnosis not present

## 2023-10-29 LAB — HM DIABETES EYE EXAM

## 2023-10-30 ENCOUNTER — Other Ambulatory Visit (HOSPITAL_COMMUNITY): Payer: Self-pay

## 2023-10-30 ENCOUNTER — Other Ambulatory Visit (HOSPITAL_COMMUNITY): Payer: Self-pay | Admitting: Pharmacy Technician

## 2023-10-30 NOTE — Progress Notes (Signed)
Specialty Pharmacy Refill Coordination Note  [name] is a 57 y.o. female contacted today regarding refills of specialty medication(s) Upadacitinib   Patient requested Delivery   Delivery date: 11/06/23   Verified address: [address]  [name]   Medication will be filled on 11/05/23.

## 2023-11-01 ENCOUNTER — Other Ambulatory Visit: Payer: Self-pay

## 2023-11-01 ENCOUNTER — Encounter: Payer: Self-pay | Admitting: Nurse Practitioner

## 2023-11-01 ENCOUNTER — Ambulatory Visit (INDEPENDENT_AMBULATORY_CARE_PROVIDER_SITE_OTHER)
Admission: RE | Admit: 2023-11-01 | Discharge: 2023-11-01 | Disposition: A | Discharge status: Home / Self Care | Payer: Commercial Managed Care - PPO | Source: Ambulatory Visit | Attending: Nurse Practitioner

## 2023-11-01 ENCOUNTER — Ambulatory Visit: Payer: Commercial Managed Care - PPO | Admitting: Nurse Practitioner

## 2023-11-01 VITALS — BP 124/68 | HR 69 | Temp 98.2°F | Ht 63.0 in | Wt 183.1 lb

## 2023-11-01 DIAGNOSIS — J0100 Acute maxillary sinusitis, unspecified: Secondary | ICD-10-CM | POA: Diagnosis not present

## 2023-11-01 DIAGNOSIS — H6993 Unspecified Eustachian tube disorder, bilateral: Secondary | ICD-10-CM | POA: Insufficient documentation

## 2023-11-01 DIAGNOSIS — M79671 Pain in right foot: Secondary | ICD-10-CM | POA: Diagnosis not present

## 2023-11-01 DIAGNOSIS — R059 Cough, unspecified: Secondary | ICD-10-CM | POA: Diagnosis not present

## 2023-11-01 DIAGNOSIS — R052 Subacute cough: Secondary | ICD-10-CM

## 2023-11-01 MED ORDER — MONTELUKAST SODIUM 10 MG PO TABS
10.0000 mg | ORAL_TABLET | Freq: Every day | ORAL | 3 refills | Status: DC
Start: 2023-11-01 — End: 2024-02-25
  Filled 2023-11-01: qty 30, 30d supply, fill #0
  Filled 2023-11-26: qty 30, 30d supply, fill #1
  Filled 2023-12-23: qty 30, 30d supply, fill #2
  Filled 2024-01-20: qty 30, 30d supply, fill #3

## 2023-11-01 MED ORDER — FEXOFENADINE HCL 180 MG PO TABS
180.0000 mg | ORAL_TABLET | Freq: Every day | ORAL | 0 refills | Status: DC
  Filled 2023-11-01: qty 30, 30d supply, fill #0
  Filled 2023-11-26: qty 30, 30d supply, fill #1
  Filled 2023-12-27: qty 30, 30d supply, fill #2

## 2023-11-01 MED ORDER — FLUTICASONE PROPIONATE 50 MCG/ACT NA SUSP
2.0000 | Freq: Every day | NASAL | 0 refills | Status: DC
Start: 2023-11-01 — End: 2023-11-26
  Filled 2023-11-01: qty 16, 30d supply, fill #0

## 2023-11-01 MED ORDER — DOXYCYCLINE HYCLATE 100 MG PO TABS
100.0000 mg | ORAL_TABLET | Freq: Two times a day (BID) | ORAL | 0 refills | Status: AC
Start: 2023-11-01 — End: 2023-11-08
  Filled 2023-11-01: qty 14, 7d supply, fill #0

## 2023-11-01 NOTE — Progress Notes (Signed)
Acute Office Visit  Subjective:     Patient ID: [name], female    DOB: 1966-01-14, 57 y.o.   MRN: [id number]  Chief Complaint  Patient presents with   Cough    C/o ongoing cough- worsening.      Patient is in today for cough with a history of DM2, MI, OSA, HLD, thyroid disease, CAD with bypass  Patient was seen by me on 08/30/2023 where she had tested positive for covid the week prior. She was started on augmentin, steroids, and cough medicaoitn. CXR showed central pulmonary congestion   States that the cough never did go away. States that it did improve and then started getting worse 2 weeks ago. States that she did have a nodule on her lung years ago. States that she has tried xyzal and claritin does not feel like it is working. States that she has been on singular in the past and no one would refill it   States that she has not had to use the albuterol inhaler   Right foot pain:  states that it hurts when she gets up to go to the bathroom at night. Sometimes she will hurt in the day after she gets done resting. States after a few steps it will go away. States that it has been a good month. States that she has tried ibuprofen in the past that helps some but not at night. States she has been told she has a bone spur and thinks it is the right    Review of Systems  Constitutional:  Negative for chills and fever.  HENT:  Positive for ear discharge and ear pain. Negative for sore throat.   Respiratory:  Positive for cough, sputum production (thick clear stuff) and shortness of breath (baseline).   Gastrointestinal:  Negative for abdominal pain, constipation, diarrhea, nausea and vomiting.  Neurological:  Positive for headaches (baseline).        Objective:    BP 124/68   Pulse 69   Temp 98.2 F (36.8 C) (Oral)   Ht 5\' 3"  (1.6 m)   Wt 183 lb 2 oz (83.1 kg)   SpO2 94%   BMI 32.44 kg/m  BP Readings from Last 3 Encounters:  11/01/23 124/68  10/28/23 136/79  09/26/23  (!) 140/76   Wt Readings from Last 3 Encounters:  11/01/23 183 lb 2 oz (83.1 kg)  10/28/23 183 lb (83 kg)  09/26/23 181 lb 3.2 oz (82.2 kg)   SpO2 Readings from Last 3 Encounters:  11/01/23 94%  10/28/23 98%  09/26/23 99%      Physical Exam Vitals and nursing note reviewed.  Constitutional:      Appearance: Normal appearance.  HENT:     Right Ear: Ear canal and external ear normal.     Left Ear: Ear canal and external ear normal.     Nose:     Right Sinus: Maxillary sinus tenderness present. No frontal sinus tenderness.     Left Sinus: Maxillary sinus tenderness present. No frontal sinus tenderness.     Mouth/Throat:     Mouth: Mucous membranes are moist.     Pharynx: Oropharynx is clear.  Cardiovascular:     Rate and Rhythm: Normal rate and regular rhythm.     Heart sounds: Normal heart sounds.  Pulmonary:     Effort: Pulmonary effort is normal.     Breath sounds: Normal breath sounds.  Lymphadenopathy:     Cervical: No cervical adenopathy.  Neurological:  Mental Status: She is alert.     No results found for any visits on 11/01/23.      Assessment & Plan:   Problem List Items Addressed This Visit       Respiratory   Acute non-recurrent maxillary sinusitis    Will treat with doxycycline 100 mg twice daily.  Also antihistamine and fluticasone nasal spray.      Relevant Medications   fluticasone (FLONASE) 50 MCG/ACT nasal spray   fexofenadine (ALLEGRA ALLERGY) 180 MG tablet   doxycycline (VIBRA-TABS) 100 MG tablet     Nervous and Auditory   Dysfunction of both eustachian tubes    Second-generation antihistamine along with particular nasal spray.  She has not established with ENT      Relevant Medications   fluticasone (FLONASE) 50 MCG/ACT nasal spray   fexofenadine (ALLEGRA ALLERGY) 180 MG tablet     Other   Cough - Primary    Has been going on for extended period of time did get better and then got worse again.  Will treat with doxycycline 100  mg twice daily pending chest x-ray.  Will also do Singulair to see if this is an allergic component.  If no improvement consider referral to pulmonology and CT scan of the chest      Relevant Medications   montelukast (SINGULAIR) 10 MG tablet   fluticasone (FLONASE) 50 MCG/ACT nasal spray   fexofenadine (ALLEGRA ALLERGY) 180 MG tablet   Other Relevant Orders   DG Chest 2 View   Right foot pain    Sounds most likely plantar fasciitis.  Rehab exercises given via MyChart.  Patient use over-the-counter analgesics as needed.  If no improvement will need to see sports medicine      Meds ordered this encounter  Medications   montelukast (SINGULAIR) 10 MG tablet    Sig: Take 1 tablet (10 mg total) by mouth at bedtime.    Dispense:  30 tablet    Refill:  3    Order Specific Question:   Supervising Provider    Answer:   Milinda Antis MARNE A [[id number]]   fluticasone (FLONASE) 50 MCG/ACT nasal spray    Sig: Place 2 sprays into both nostrils daily.    Dispense:  16 g    Refill:  0    Order Specific Question:   Supervising Provider    Answer:   Milinda Antis MARNE A [[id number]]   fexofenadine (ALLEGRA ALLERGY) 180 MG tablet    Sig: Take 1 tablet (180 mg total) by mouth daily.    Dispense:  90 tablet    Refill:  0    Order Specific Question:   Supervising Provider    Answer:   Milinda Antis, MARNE A [[id number]]   doxycycline (VIBRA-TABS) 100 MG tablet    Sig: Take 1 tablet (100 mg total) by mouth 2 (two) times daily for 7 days.    Dispense:  14 tablet    Refill:  0    Order Specific Question:   Supervising Provider    Answer:   TOWER, MARNE A [[id number]]    Return if symptoms worsen or fail to improve.  Audria Nine, NP

## 2023-11-01 NOTE — Assessment & Plan Note (Signed)
Sounds most likely plantar fasciitis.  Rehab exercises given via MyChart.  Patient use over-the-counter analgesics as needed.  If no improvement will need to see sports medicine

## 2023-11-01 NOTE — Assessment & Plan Note (Signed)
Second-generation antihistamine along with particular nasal spray.  She has not established with ENT

## 2023-11-01 NOTE — Assessment & Plan Note (Signed)
Will treat with doxycycline 100 mg twice daily.  Also antihistamine and fluticasone nasal spray.

## 2023-11-01 NOTE — Assessment & Plan Note (Signed)
Has been going on for extended period of time did get better and then got worse again.  Will treat with doxycycline 100 mg twice daily pending chest x-ray.  Will also do Singulair to see if this is an allergic component.  If no improvement consider referral to pulmonology and CT scan of the chest

## 2023-11-01 NOTE — Patient Instructions (Signed)
Nice to see you today I have sent the medications to the pharmacy  I will be in touch with the xray once I have reviewed it

## 2023-11-07 ENCOUNTER — Encounter (INDEPENDENT_AMBULATORY_CARE_PROVIDER_SITE_OTHER): Payer: Self-pay | Admitting: Adult Health

## 2023-11-07 ENCOUNTER — Ambulatory Visit (INDEPENDENT_AMBULATORY_CARE_PROVIDER_SITE_OTHER): Payer: Commercial Managed Care - PPO | Admitting: Adult Health

## 2023-11-07 VITALS — BP 126/72 | HR 85 | Temp 98.1°F | Ht 62.0 in | Wt 180.0 lb

## 2023-11-07 DIAGNOSIS — I10 Essential (primary) hypertension: Secondary | ICD-10-CM

## 2023-11-07 DIAGNOSIS — E108 Type 1 diabetes mellitus with unspecified complications: Secondary | ICD-10-CM

## 2023-11-07 DIAGNOSIS — E669 Obesity, unspecified: Secondary | ICD-10-CM | POA: Diagnosis not present

## 2023-11-07 DIAGNOSIS — Z6832 Body mass index (BMI) 32.0-32.9, adult: Secondary | ICD-10-CM

## 2023-11-07 DIAGNOSIS — E559 Vitamin D deficiency, unspecified: Secondary | ICD-10-CM | POA: Diagnosis not present

## 2023-11-07 DIAGNOSIS — Z0289 Encounter for other administrative examinations: Secondary | ICD-10-CM

## 2023-11-07 NOTE — Progress Notes (Signed)
Office: [phone]  /  Fax: [phone]   Initial Visit  [name] was seen in clinic today to evaluate for obesity. She is interested in losing weight to improve overall health and reduce the risk of weight related complications. She presents today to review program treatment options, initial physical assessment, and evaluation.     She was referred by: Specialist  When asked what else they would like to accomplish? She states: Adopt healthier eating patterns, Improve energy levels and physical activity, Improve existing medical conditions, Reduce number of medications, and Lose a target amount of weight : Current weight 180, goal weight 140   Weight history: Significant weight gain since 2021  When asked how has your weight affected you? She states: Contributed to medical problems, Contributed to orthopedic problems or mobility issues, Having fatigue, Having poor endurance, and Problems with eating patterns  Some associated conditions: Hypertension, Hyperlipidemia, Diabetes, and Vitamin D Deficiency  Contributing factors: Family history, Nutritional, Medications, Reduced physical activity, Eating patterns, and Hx of pregnancies  Weight promoting medications identified: Beta-blockers and Obesogenic diabetes medications  Current nutrition plan: None  Current level of physical activity: Walking  Current or previous pharmacotherapy: GLP-1  Response to medication: Had side effects so it was discontinued and Lost weight initially but was unable to sustain weight loss   Past medical history includes:   Past Medical History:  Diagnosis Date   ACUT MI ANTEROLAT WALL SUBSQT EPIS CARE    Acute maxillary sinusitis    ALLERGIC RHINITIS, SEASONAL    Anemia    ARTHRITIS, RHEUMATOID    shoulders and hands Enbrel>leg swelling Dr. Corliss Skains   Atrial fibrillation [facility])    a. after CABG.   Back pain    Bruxism (teeth grinding)    CAD, ARTERY BYPASS GRAFT    a. DES to RCA in 2010  then LAD occlusion s/p CABG 3 06/07/2009 with LIMA to LAD, reverse SVG to D1, reverse SVG to distal RCA. b. Cath 05/08/2016 slightly hypodense region in the intermediate branch, however she had excellent flow, FFR was normal. Vein graft to PDA and the posterolateral branch is patent, patent LIMA to LAD, occluded SVG to diagonal.   CARPAL TUNNEL SYNDROME, BILATERAL    CHOLELITHIASIS    Contrast media allergy    DERMATITIS, ALLERGIC    DIABETES MELLITUS, TYPE I    on insulin pump dx'ed age 57 y.o    DIABETIC  RETINOPATHY    Hiatal hernia    HYPERLIPIDEMIA-MIXED    HYPERTHYROIDISM    Dr. Osborne Casco   IDA (iron deficiency anemia)    Insulin pump in place    Lymphadenopathy of head and neck    MIGRAINE W/O AURA W/INTRACT W/STATUS MIGRAINOSUS 02/19/2008   PONV (postoperative nausea and vomiting)    Psoriasis    SINUS TACHYCARDIA 11/08/2010   Sleep apnea    not using machine yet    SVT (supraventricular tachycardia) (HCC)    after s/p CABG   Tendonitis    TRIGGER FINGER    all fingers b/l hands    URI      Objective:   BP 126/72   Pulse 85   Temp 98.1 F (36.7 C)   Ht 5\' 2"  (1.575 m)   Wt 180 lb (81.6 kg)   SpO2 97%   BMI 32.92 kg/m  She was weighed on the bioimpedance scale: Body mass index is 32.92 kg/m.  Peak Weight:185 , Body Fat%:47.5, Visceral Fat Rating:12, Weight trend over the last  12 months: Increasing  General:  Alert, oriented and cooperative. Patient is in no acute distress.  Respiratory: Normal respiratory effort, no problems with respiration noted   Gait: able to ambulate independently  Mental Status: Normal mood and affect. Normal behavior. Normal judgment and thought content.   DIAGNOSTIC DATA REVIEWED:  BMET    Component Value Date/Time   NA 138 09/12/2023 1548   NA 139 02/15/2021 1141   K 4.0 09/12/2023 1548   CL 101 09/12/2023 1548   CO2 28 09/12/2023 1548   GLUCOSE 159 (H) 09/12/2023 1548   BUN 9 09/12/2023 1548   BUN 15 02/15/2021 1141    CREATININE 0.80 09/12/2023 1548   CALCIUM 8.9 09/12/2023 1548   GFRNONAA >60 06/01/2021 0757   GFRNONAA 74 04/07/2021 0855   GFRAA 86 04/07/2021 0855   Lab Results  Component Value Date   HGBA1C 6.3 (A) 09/26/2023   HGBA1C 6.8 06/26/2007   No results found for: "INSULIN" CBC    Component Value Date/Time   WBC 3.4 (L) 09/18/2023 1447   WBC 3.8 09/12/2023 1548   RBC 3.71 (L) 09/18/2023 1447   HGB 11.1 (L) 09/18/2023 1447   HGB 13.4 02/15/2020 1656   HCT 33.5 (L) 09/18/2023 1447   HCT 38.9 02/15/2020 1656   PLT 188 09/18/2023 1447   PLT 172 02/15/2020 1656   MCV 90.3 09/18/2023 1447   MCV 87 02/15/2020 1656   MCH 29.9 09/18/2023 1447   MCHC 33.1 09/18/2023 1447   RDW 12.7 09/18/2023 1447   RDW 12.8 02/15/2020 1656   Iron/TIBC/Ferritin/ %Sat    Component Value Date/Time   IRON 83 09/18/2023 1447   TIBC 406 09/18/2023 1447   FERRITIN 58 09/18/2023 1447   IRONPCTSAT 20 09/18/2023 1447   Lipid Panel     Component Value Date/Time   CHOL 133 12/05/2022 1558   TRIG 95.0 12/05/2022 1558   HDL 62.40 12/05/2022 1558   CHOLHDL 2 12/05/2022 1558   VLDL 19.0 12/05/2022 1558   LDLCALC 52 12/05/2022 1558   Hepatic Function Panel     Component Value Date/Time   PROT 6.0 (L) 09/12/2023 1548   ALBUMIN 4.4 06/12/2023 1617   AST 22 09/12/2023 1548   ALT 19 09/12/2023 1548   ALKPHOS 67 06/12/2023 1617   BILITOT 0.5 09/12/2023 1548   BILIDIR 0.1 11/08/2010 1257   IBILI 0.4 07/05/2009 0743      Component Value Date/Time   TSH 1.34 06/12/2023 1617     Assessment and Plan:   Vitamin D deficiency  Type 1 diabetes mellitus with complications (HCC)  Hypertension, essential  Obesity (BMI 30-39.9), Starting BMI  ESTABLISH WITH HWW   Obesity Treatment / Action Plan:  Patient will work on garnering support from family and friends to begin weight loss journey. Will work on eliminating or reducing the presence of highly palatable, calorie dense foods in the home. Will  complete provided nutritional and psychosocial assessment questionnaire before the next appointment. Will be scheduled for indirect calorimetry to determine resting energy expenditure in a fasting state.  This will allow [place] to create a reduced calorie, high-protein meal plan to promote loss of fat mass while preserving muscle mass. Counseled on the health benefits of losing 5%-15% of total body weight. Was counseled on nutritional approaches to weight loss and benefits of reducing processed foods and consuming plant-based foods and high quality protein as part of nutritional weight management. Was counseled on pharmacotherapy and role as an adjunct in weight management.  Obesity Education Performed Today:  She was weighed on the bioimpedance scale and results were discussed and documented in the synopsis.  We discussed obesity as a disease and the importance of a more detailed evaluation of all the factors contributing to the disease.  We discussed the importance of long term lifestyle changes which include nutrition, exercise and behavioral modifications as well as the importance of customizing this to her specific health and social needs.  We discussed the benefits of reaching a healthier weight to alleviate the symptoms of existing conditions and reduce the risks of the biomechanical, metabolic and psychological effects of obesity.  [name] appears to be in the action stage of change and states they are ready to start intensive lifestyle modifications and behavioral modifications.  30 minutes was spent today on this visit including the above counseling, pre-visit chart review, and post-visit documentation.  Reviewed by clinician on day of visit: allergies, medications, problem list, medical history, surgical history, family history, social history, and previous encounter notes pertinent to obesity diagnosis.   Katheen Aslin d. Kostantinos Tallman, NP-C

## 2023-11-11 MED FILL — Cyclobenzaprine HCl Tab 10 MG: ORAL | 30 days supply | Qty: 30 | Fill #1 | Status: AC

## 2023-11-14 MED FILL — Linaclotide Cap 290 MCG: ORAL | 90 days supply | Qty: 90 | Fill #1 | Status: AC

## 2023-11-21 ENCOUNTER — Other Ambulatory Visit: Payer: Self-pay | Admitting: Physician Assistant

## 2023-11-21 ENCOUNTER — Other Ambulatory Visit (HOSPITAL_COMMUNITY): Payer: Self-pay

## 2023-11-21 ENCOUNTER — Other Ambulatory Visit: Payer: Self-pay

## 2023-11-21 DIAGNOSIS — M0609 Rheumatoid arthritis without rheumatoid factor, multiple sites: Secondary | ICD-10-CM

## 2023-11-21 MED ORDER — RINVOQ 15 MG PO TB24
15.0000 mg | ORAL_TABLET | Freq: Every day | ORAL | 0 refills | Status: DC
  Filled 2023-11-21: qty 30, 30d supply, fill #0
  Filled 2023-12-24: qty 30, 30d supply, fill #1
  Filled 2024-01-24: qty 30, 30d supply, fill #2

## 2023-11-21 NOTE — Telephone Encounter (Signed)
Last Fill: 09/04/2023  Labs: 09/12/2023 RBC count, hgb, and hct remain low and have trended down. Glucose is 159. Total protein is borderline low. Rest of CMP WNL.  TB Gold: 10/10/2022 Neg    Next Visit: 12/10/2023  Last Visit: 06/13/2023  [id number] arthritis of multiple sites with negative rheumatoid factor   Current Dose per office note 06/13/2023: Rinvoq 15 mg 1 tablet by mouth once daily   Patient states she has updated her TB Gold with [facility] [employer]. Patient states she will bring [place] a copy at her follow up appointment.   Okay to refill Rinvoq?

## 2023-11-21 NOTE — Progress Notes (Signed)
Specialty Pharmacy Refill Coordination Note  [name] is a 57 y.o. female contacted today regarding refills of specialty medication(s) Upadacitinib   Patient requested Delivery   Delivery date: 12/03/23   Verified address: [address] [name] [address]   Medication will be filled on 12/02/23.

## 2023-11-22 ENCOUNTER — Other Ambulatory Visit (HOSPITAL_COMMUNITY): Payer: Self-pay

## 2023-11-26 ENCOUNTER — Other Ambulatory Visit: Payer: Self-pay

## 2023-11-26 ENCOUNTER — Other Ambulatory Visit: Payer: Self-pay | Admitting: Nurse Practitioner

## 2023-11-26 ENCOUNTER — Encounter (INDEPENDENT_AMBULATORY_CARE_PROVIDER_SITE_OTHER): Payer: Self-pay | Admitting: Family Medicine

## 2023-11-26 ENCOUNTER — Ambulatory Visit (INDEPENDENT_AMBULATORY_CARE_PROVIDER_SITE_OTHER): Payer: Commercial Managed Care - PPO | Admitting: Family Medicine

## 2023-11-26 VITALS — BP 138/83 | HR 76 | Temp 98.3°F | Ht 62.5 in | Wt 178.0 lb

## 2023-11-26 DIAGNOSIS — E782 Mixed hyperlipidemia: Secondary | ICD-10-CM | POA: Diagnosis not present

## 2023-11-26 DIAGNOSIS — I2510 Atherosclerotic heart disease of native coronary artery without angina pectoris: Secondary | ICD-10-CM | POA: Diagnosis not present

## 2023-11-26 DIAGNOSIS — R5383 Other fatigue: Secondary | ICD-10-CM

## 2023-11-26 DIAGNOSIS — R0602 Shortness of breath: Secondary | ICD-10-CM

## 2023-11-26 DIAGNOSIS — E669 Obesity, unspecified: Secondary | ICD-10-CM | POA: Diagnosis not present

## 2023-11-26 DIAGNOSIS — E108 Type 1 diabetes mellitus with unspecified complications: Secondary | ICD-10-CM | POA: Diagnosis not present

## 2023-11-26 DIAGNOSIS — E1069 Type 1 diabetes mellitus with other specified complication: Secondary | ICD-10-CM | POA: Diagnosis not present

## 2023-11-26 DIAGNOSIS — E559 Vitamin D deficiency, unspecified: Secondary | ICD-10-CM | POA: Diagnosis not present

## 2023-11-26 DIAGNOSIS — I152 Hypertension secondary to endocrine disorders: Secondary | ICD-10-CM

## 2023-11-26 DIAGNOSIS — Z1331 Encounter for screening for depression: Secondary | ICD-10-CM

## 2023-11-26 DIAGNOSIS — Z6832 Body mass index (BMI) 32.0-32.9, adult: Secondary | ICD-10-CM

## 2023-11-26 DIAGNOSIS — Z794 Long term (current) use of insulin: Secondary | ICD-10-CM

## 2023-11-26 DIAGNOSIS — H6993 Unspecified Eustachian tube disorder, bilateral: Secondary | ICD-10-CM

## 2023-11-26 DIAGNOSIS — E0590 Thyrotoxicosis, unspecified without thyrotoxic crisis or storm: Secondary | ICD-10-CM | POA: Diagnosis not present

## 2023-11-26 DIAGNOSIS — J0100 Acute maxillary sinusitis, unspecified: Secondary | ICD-10-CM

## 2023-11-26 DIAGNOSIS — R052 Subacute cough: Secondary | ICD-10-CM

## 2023-11-26 NOTE — Telephone Encounter (Signed)
LAST APPOINTMENT DATE: 11/01/2023   NEXT APPOINTMENT DATE: 12/12/2023  Flonase nasal spray  LAST REFILL: 11/01/23  QTY: #16g

## 2023-11-26 NOTE — Progress Notes (Signed)
[name], D.O.  ABFM, ABOM Specializing in Clinical Bariatric Medicine Office located at: [address] [place], [place]  [address]   Bariatric Medicine Visit  Dear Toney Reil, Genene Churn, NP   Thank you for referring [name] to our clinic today for evaluation.  We performed a consultation to discuss her options for treatment and educate the patient on her disease state.  The following note includes my evaluation and treatment recommendations.   Please do not hesitate to reach out to me directly if you have any further concerns.   Assessment and Plan:   FOR THE DISEASE OF OBESITY: BMI 32.0-32.9,adult Obesity (BMI 30-39.9), Starting BMI 32.02 Assessment & Plan:  Recommended Dietary Goals [name] is currently in the action stage of change. As such, her goal is to continue weight management plan.  She has agreed to: begin our protein rich vegetarian plan   Behavioral Intervention We discussed the following Behavioral Modification Strategies today: begin to work on maintaining a reduced calorie state, getting the recommended amount of protein, incorporating whole foods, making healthy choices, staying well hydrated and practicing mindfulness when eating.  Additional resources provided today:  Handout on Vegetarian meal plan  Evidence-based interventions for health behavior change were utilized today including the discussion of self monitoring techniques, problem-solving barriers and SMART goal setting techniques.   Regarding patient's less desirable eating habits and patterns, we employed the technique of small changes.   Pt will specifically work on:  focusing on portions   Recommended Physical Activity Goals [name] has been advised to work up to 150 minutes of moderate intensity aerobic activity a week and strengthening exercises 2-3 times per week for cardiovascular health, weight loss maintenance and preservation of muscle mass.   She has agreed to : Continue  current level of physical activity    Pharmacotherapy We both agreed to : begin with nutritional and behavioral strategies   FOR ASSOCIATED CONDITIONS ADDRESSED TODAY:  Other Fatigue Assessment & Plan: [name] does feel that her weight is causing her energy to be lower than it should be. Fatigue may be related to obesity, depression or many other causes. she does not appear to have any red flag symptoms and this appears to most likely be related to her current lifestyle habits and dietary intake.  Labs will be ordered and reviewed with her at their next office visit in two weeks.  Epworth sleepiness scale is 12 and does not appear to be within normal limits. [name] admits to some daytime somnolence and admits to waking up still tired. [name] generally gets about 5-7  hours of sleep per night, and states that she has generally unrestful sleep. Snoring is present. [name] has the diagnosis of OSA - she endorses not consistently using her CPAP.   Strongly encouraged pt to consistently use her CPAP. Discussed the importance of 7-9 hrs of sleep for the best weight loss results and overall well being. F/up with neurology as needed.   ECG from March 5th, 2024: Normal sinus rhythm, rate 65 bpm; no acute changes, will continue to monitor for symptoms.    Shortness of breath on exertion Assessment & Plan: [name] does feel that she gets out of breath more easily than she used to when she exercises and seems to be worsening over time with weight gain.  This has gotten worse recently. [name] denies shortness of breath at rest or orthopnea. [name]'s shortness of breath appears to be obesity related and exercise induced, as they  do not appear to have any "red flag" symptoms/ concerns today.  Also, this condition appears to be related to a state of poor cardiovascular conditioning   Obtain labs today and will be reviewed with her at their next office visit in two weeks.  Indirect Calorimeter completed today to  help guide our dietary regimen. It shows a VO2 of 236 and a REE of 1627.  Her calculated basal metabolic rate is 1540 thus her measured basal metabolic rate is better than expected.  Patient agreed to work on weight loss at this time.  As [name] progresses through our weight loss program, we will gradually increase exercise as tolerated to treat her current condition.   If [name] follows our recommendations and loses 5-10% of their weight without improvement of her shortness of breath or if at any time, symptoms become more concerning, they agree to urgently follow up with their PCP/ specialist for further consideration/ evaluation.   [name] verbalizes agreement with this plan.    Depression screen [Z13.31] Assessment & Plan: [name] had a borderline depression screening of 12. She denies SI/HI. Depression is commonly associated with obesity and often results in emotional eating behaviors.    We will monitor this closely and work on CBT to help improve the non-hunger eating patterns. Referral to bariatric psychologist may be required if no improvement is seen as she continues in our clinic.    Chronic coronary artery disease Assessment & Plan: Pt with hx of coronary artery disease status post CABG on 05/2009. She had a cardiac cath (x2) on 2010 and another one on Feb 2022. She is currently on Plavix. She sees cardiology about once a year. Continue with all treatment per cardiology and f/up with them as directed.    Type 1 diabetes mellitus with complications [facility]) Assessment & Plan: Most recent Hemoglobin A1c:  Lab Results  Component Value Date   HGBA1C 6.3 (A) 09/26/2023   HGBA1C 7.0 (H) 05/15/2022   HGBA1C 6.8 (H) 01/11/2022    Pt has had T1DM since 57 y.o. She is managed by Dr.Gerghe and is on an insulin pump. Continue with treatment per endocrinology. Begin reduced calorie nutritional plan.   Orders:  -     Comprehensive metabolic panel -     Insulin, random -     Lipid Panel With LDL/HDL  Ratio -     T4, free -     TSH   Mixed diabetic hyperlipidemia associated with type 1 diabetes mellitus (HCC) Assessment & Plan: Most recent Lipid Panel:  Lab Results  Component Value Date   CHOL 133 12/05/2022   HDL 62.40 12/05/2022   LDLCALC 52 12/05/2022   TRIG 95.0 12/05/2022   CHOLHDL 2 12/05/2022   Pt has had this diagnosis since 2010. She is on Rosuvastatin 20 mg daily and Krill Oil 500 mg daily. She will continue with all treatment per cardiology and begin our heart-healthy, low cholesterol meal plan.    Hypertension associated with diabetes (HCC) Assessment & Plan: Last 3 blood pressure readings in our office are as follows: BP Readings from Last 3 Encounters:  11/26/23 138/83  11/07/23 126/72  11/01/23 124/68   Pt has had this diagnosis since 2010. HTN treated with Torsemide, Losartan, Metoprolol Tartrate, and Isosorbide Mononitrate. Blood pressure stable today; no concerns in this regard.   Continue with all antihypertensives per Cardiology. Continue with Prudent nutritional plan and low sodium diet, advance exercise as tolerated.     Vitamin D deficiency Assessment & Plan:  Most recent Vitamin D:  Lab Results  Component Value Date   VD25OH 74.19 12/05/2022   VD25OH 80.30 05/15/2022   VD25OH 60.98 05/23/2020   She is on a daily multivitamin. She will continue with current supplementation regiment and we will recheck vit D levels today.   Orders: -     VITAMIN D 25 Hydroxy (Vit-D Deficiency, Fractures)   FOLLOW UP:   Follow up in 2 weeks. She was informed of the importance of frequent follow up visits to maximize her success with intensive lifestyle modifications for her multiple health conditions.  RIGHTEOUS BOUCHIE is aware that we will review all of her lab results at our next visit.  She is aware that if anything is critical/ life threatening with the results, we will be contacting her via MyChart prior to the office visit to discuss management.    Chief  Complaint:   OBESITY [name] (MR# [id number]) is a pleasant 57 y.o. female who presents for evaluation and treatment of obesity and related comorbidities. Current BMI is Body mass index is 32.04 kg/m. [name] has been struggling with her weight for many years and has been unsuccessful in either losing weight, maintaining weight loss, or reaching her healthy weight goal.  PREYA STONEBURG is currently in the action stage of change and ready to dedicate time achieving and maintaining a healthier weight. [name] is interested in becoming our patient and working on intensive lifestyle modifications including (but not limited to) diet and exercise for weight loss.  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 Patient is single and lives with her 2 daughters.   Has tried GOLO in the past - lost 5-10 lbs. Enjoyed GOLO because it balanced her blood sugars and was exercising at the time.   She was on Ozempic in the past - lost 20 lbs, regained weight.   Has been Vegetarian for about 1.3 years.   Skips breakfast every day.   Eats out once a week.   Craves sweets.   Dislikes meats.   Snacks on peanut butter + crackers, chips, and candy.   Worst food habit: diet cokes and 3 pm snacks.   Subjective:   This is the patient's first visit at [facility].  The patient's NEW PATIENT PACKET that they filled out prior to today's office visit was reviewed at length and information from that paperwork was included within the following office visit note.    Included in the packet: current and past health history, medications, allergies, ROS, gynecologic history (women only), surgical history, family history, social history, weight history, weight loss surgery history (for those that have had weight loss surgery), nutritional evaluation, mood and food questionnaire along with a depression screening (PHQ9) on all patients, an Epworth questionnaire, sleep habits questionnaire,  patient life and health improvement goals questionnaire. These will all be scanned into the patient's chart under the "media" tab.   Review of Systems: Please refer to new patient packet scanned into media. Pertinent positives were addressed with patient today.  Reviewed by clinician on day of visit: allergies, medications, problem list, medical history, surgical history, family history, social history, and previous encounter notes.  During the visit, I independently reviewed the patient's EKG, bioimpedance scale results, and indirect calorimeter results. I used this information to tailor a meal plan for the patient that will help [name] to lose weight and will improve her obesity-related conditions going forward.  I performed a medically necessary appropriate examination and/or evaluation. I discussed the assessment and treatment plan with the patient. The patient was provided an opportunity to ask questions and all were answered. The patient agreed with the plan and demonstrated an understanding of the instructions. Labs were ordered today (unless patient declined them) and will be reviewed with the patient at our next visit unless more critical results need to be addressed immediately. Clinical information was updated and documented in the EMR.   Objective:   PHYSICAL EXAM: Blood pressure 138/83, pulse 76, temperature 98.3 F (36.8 C), height 5' 2.5" (1.588 m), weight 178 lb (80.7 kg), SpO2 97%. Body mass index is 32.04 kg/m.  General: Well Developed, well nourished, and in no acute distress.  HEENT: Normocephalic, atraumatic; EOMI, sclerae are anicteric. Skin: Warm and dry, good turgor Chest:  Normal excursion, shape, no gross ABN Respiratory: No conversational dyspnea; speaking in full sentences NeuroM-Sk:  Normal gross ROM * 4 extremities  Psych: A and O *3, insight adequate, mood- full   Anthropometric Measurements Height: 5' 2.5" (1.588 m) Weight: 178 lb (80.7 kg) BMI  (Calculated): 32.02 Weight at Last Visit: na Weight Lost Since Last Visit: na Weight Gained Since Last Visit: na Starting Weight: 178lb Total Weight Loss (lbs): 0 lb (0 kg) Peak Weight: 190lb Waist Measurement : 45 inches   Body Composition  Body Fat %: 47 % Fat Mass (lbs): 84 lbs Muscle Mass (lbs): 89.8 lbs Total Body Water (lbs): 73.8 lbs Visceral Fat Rating : 12   Other Clinical Data RMR: [id number] Fasting: yes Labs: yes Today's Visit #: 1 Starting Date: 11/26/23 Comments: first visit   DIAGNOSTIC DATA REVIEWED:  BMET    Component Value Date/Time   NA 138 09/12/2023 1548   NA 139 02/15/2021 1141   K 4.0 09/12/2023 1548   CL 101 09/12/2023 1548   CO2 28 09/12/2023 1548   GLUCOSE 159 (H) 09/12/2023 1548   BUN 9 09/12/2023 1548   BUN 15 02/15/2021 1141   CREATININE 0.80 09/12/2023 1548   CALCIUM 8.9 09/12/2023 1548   GFRNONAA >60 06/01/2021 0757   GFRNONAA 74 04/07/2021 0855   GFRAA 86 04/07/2021 0855   Lab Results  Component Value Date   HGBA1C 6.3 (A) 09/26/2023   HGBA1C 6.8 06/26/2007   No results found for: "INSULIN" Lab Results  Component Value Date   TSH 1.34 06/12/2023   CBC    Component Value Date/Time   WBC 3.4 (L) 09/18/2023 1447   WBC 3.8 09/12/2023 1548   RBC 3.71 (L) 09/18/2023 1447   HGB 11.1 (L) 09/18/2023 1447   HGB 13.4 02/15/2020 1656   HCT 33.5 (L) 09/18/2023 1447   HCT 38.9 02/15/2020 1656   PLT 188 09/18/2023 1447   PLT 172 02/15/2020 1656   MCV 90.3 09/18/2023 1447   MCV 87 02/15/2020 1656   MCH 29.9 09/18/2023 1447   MCHC 33.1 09/18/2023 1447   RDW 12.7 09/18/2023 1447   RDW 12.8 02/15/2020 1656   Iron Studies    Component Value Date/Time   IRON 83 09/18/2023 1447   TIBC 406 09/18/2023 1447   FERRITIN 58 09/18/2023 1447   IRONPCTSAT 20 09/18/2023 1447   Lipid Panel     Component Value Date/Time   CHOL 133 12/05/2022 1558   TRIG 95.0 12/05/2022 1558   HDL 62.40 12/05/2022 1558   CHOLHDL 2 12/05/2022 1558   VLDL  19.0 12/05/2022 1558   LDLCALC 52 12/05/2022 1558   Hepatic Function  Panel     Component Value Date/Time   PROT 6.0 (L) 09/12/2023 1548   ALBUMIN 4.4 06/12/2023 1617   AST 22 09/12/2023 1548   ALT 19 09/12/2023 1548   ALKPHOS 67 06/12/2023 1617   BILITOT 0.5 09/12/2023 1548   BILIDIR 0.1 11/08/2010 1257   IBILI 0.4 07/05/2009 0743      Component Value Date/Time   TSH 1.34 06/12/2023 1617   Nutritional Lab Results  Component Value Date   VD25OH 74.19 12/05/2022   VD25OH 80.30 05/15/2022   VD25OH 60.98 05/23/2020    Attestation Statements:   I, Special Puri, acting as a Stage manager for Thomasene Lot, DO., have compiled all relevant documentation for today's office visit on behalf of Thomasene Lot, DO, while in the presence of [employer], DO.  Reviewed by clinician on day of visit: allergies, medications, problem list, medical history, surgical history, family history, social history, and previous encounter notes pertinent to patient's obesity diagnosis. I have spent 45 minutes in the care of the patient today including: preparing to see patient (e.g. review and interpretation of tests, old notes ), obtaining and/or reviewing separately obtained history, performing a medically appropriate examination or evaluation, counseling and educating the patient, ordering medications, test or procedures, documenting clinical information in the electronic or other health care record, and independently interpreting results and communicating results to the patient, family, or caregiver   I have reviewed the above documentation for accuracy and completeness, and I agree with the above. [name], D.O.  The 21st Century Cures Act was signed into law in 2016 which includes the topic of electronic health records.  This provides immediate access to information in MyChart.  This includes consultation notes, operative notes, office notes, lab results and pathology reports.  If you  have any questions about what you read please let us know at your next visit so we can discuss your concerns and take corrective action if need be.  We are right here with you.

## 2023-11-27 ENCOUNTER — Other Ambulatory Visit: Payer: Self-pay

## 2023-11-27 LAB — VITAMIN D 25 HYDROXY (VIT D DEFICIENCY, FRACTURES): Vit D, 25-Hydroxy: 92.9 ng/mL (ref 30.0–100.0)

## 2023-11-27 LAB — COMPREHENSIVE METABOLIC PANEL
ALT: 15 [IU]/L (ref 0–32)
AST: 24 [IU]/L (ref 0–40)
Albumin: 4.4 g/dL (ref 3.8–4.9)
Alkaline Phosphatase: 76 [IU]/L (ref 44–121)
BUN/Creatinine Ratio: 13 (ref 9–23)
BUN: 10 mg/dL (ref 6–24)
Bilirubin Total: 0.3 mg/dL (ref 0.0–1.2)
CO2: 24 mmol/L (ref 20–29)
Calcium: 9.1 mg/dL (ref 8.7–10.2)
Chloride: 103 mmol/L (ref 96–106)
Creatinine, Ser: 0.75 mg/dL (ref 0.57–1.00)
Globulin, Total: 1.7 g/dL (ref 1.5–4.5)
Glucose: 91 mg/dL (ref 70–99)
Potassium: 3.9 mmol/L (ref 3.5–5.2)
Sodium: 140 mmol/L (ref 134–144)
Total Protein: 6.1 g/dL (ref 6.0–8.5)
eGFR: 93 mL/min/{1.73_m2} (ref >59)

## 2023-11-27 LAB — LIPID PANEL WITH LDL/HDL RATIO
Cholesterol, Total: 162 mg/dL (ref 100–199)
HDL: 76 mg/dL (ref >39)
LDL Chol Calc (NIH): 69 mg/dL (ref 0–99)
LDL/HDL Ratio: 0.9 {ratio} (ref 0.0–3.2)
Triglycerides: 96 mg/dL (ref 0–149)
VLDL Cholesterol Cal: 17 mg/dL (ref 5–40)

## 2023-11-27 LAB — T4, FREE: Free T4: 1.15 ng/dL (ref 0.82–1.77)

## 2023-11-27 LAB — TSH: TSH: 2.05 u[IU]/mL (ref 0.450–4.500)

## 2023-11-27 LAB — INSULIN, RANDOM: INSULIN: <0.4 u[IU]/mL — ABNORMAL LOW (ref 2.6–24.9)

## 2023-11-27 MED ORDER — FLUTICASONE PROPIONATE 50 MCG/ACT NA SUSP
2.0000 | Freq: Every day | NASAL | 0 refills | Status: AC
  Filled 2023-11-27: qty 16, 30d supply, fill #0

## 2023-11-27 NOTE — Progress Notes (Signed)
Office Visit Note  Patient: [name]             Date of Birth: 01-06-1966           MRN: [id number]             PCP: Eden Emms, NP Referring: Eden Emms, NP Visit Date: 12/10/2023 Occupation: @GUAROCC @  Subjective:  Generalized achiness   History of Present Illness: [name] is a 57 y.o. female with seronegative rheumatoid arthritis and osteoarthritis.  She returns today after her last visit on June 13, 2023.  She continues to be on Rinvoq 15 mg p.o. daily and Arava 20 mg p.o. daily.  She has been having increased pain in her hands and her neck.  She has not noticed any joint swelling.  She had PRP injections for trochanteric bursitis by Dr. Shon Baton which helped for some time.  She has been seeing Dr. Altamese Dilling for pain management.  She is on naltrexone for 2 months now.  Has not noticed much improvement so far.    Activities of Daily Living:  Patient reports morning stiffness for 1 hour.   Patient Reports nocturnal pain.  Difficulty dressing/grooming: Denies Difficulty climbing stairs: Reports Difficulty getting out of chair: Denies Difficulty using hands for taps, buttons, cutlery, and/or writing: Reports  Review of Systems  Constitutional:  Positive for fatigue.  HENT:  Negative for mouth sores and mouth dryness.   Eyes:  Positive for dryness.  Respiratory:  Negative for shortness of breath.   Cardiovascular:  Negative for chest pain and palpitations.  Gastrointestinal:  Negative for blood in stool, constipation and diarrhea.  Endocrine: Negative for increased urination.  Genitourinary:  Negative for involuntary urination.  Musculoskeletal:  Positive for joint pain, gait problem, joint pain, joint swelling, myalgias, morning stiffness, muscle tenderness and myalgias. Negative for muscle weakness.  Skin:  Negative for color change, rash, hair loss and sensitivity to sunlight.  Allergic/Immunologic: Negative for susceptible to infections.  Neurological:   Negative for dizziness and headaches.  Hematological:  Negative for swollen glands.  Psychiatric/Behavioral:  Positive for sleep disturbance. Negative for depressed mood. The patient is not nervous/anxious.     PMFS History:  Patient Active Problem List   Diagnosis Date Noted   Dysfunction of both eustachian tubes 11/01/2023   Lower respiratory infection 08/30/2023   Lower extremity edema 06/12/2023   Myofascial pain dysfunction syndrome 03/22/2023   Left foot pain 12/05/2022   Urinary urgency 10/23/2022   Pyelonephritis 10/23/2022   Premature atrial contractions 05/15/2022   PVC's (premature ventricular contractions) 05/15/2022   Low hemoglobin 01/16/2022   Otalgia of right ear 01/09/2022   Sore throat 01/09/2022   OSA on CPAP 08/02/2021   Abnormal MRI, lumbar spine 03/20/2021   Abnormal MRI, thoracic spine 03/20/2021   Chronic mid back pain 03/17/2021   Chronic midline low back pain with left-sided sciatica 03/17/2021   Type 1 diabetes mellitus with diabetic polyneuropathy (HCC) 02/17/2021   Severe obstructive sleep apnea-hypopnea syndrome 02/17/2021   Chronic diastolic CHF (congestive heart failure) (HCC) 12/19/2020   Chronic back pain 12/19/2020   Chronic abdominal pain 12/19/2020   Acute diastolic heart failure (HCC) 12/09/2020   Diabetic retinopathy associated with type 1 diabetes mellitus (HCC) 09/27/2020   Diabetic retinopathy (HCC) 09/27/2020   Graves' disease 09/27/2020   Chronic coronary artery disease 09/27/2020   Hypertension associated with diabetes (HCC) 09/14/2020   Class 1 obesity 09/14/2020   Preventative health care 09/14/2020  Aortic atherosclerosis (HCC) 09/14/2020   Snoring 05/12/2020   Lung nodule 03/10/2020   Colitis 03/10/2020   Type 1 diabetes mellitus with proliferative retinopathy (HCC) 12/10/2019   Abnormal thyroid function test 12/10/2019   Bruxism (teeth grinding)    Migraine 10/07/2019   Contusion of left hip 07/25/2019   H/O multiple  allergies 03/10/2019   Hx of anaphylaxis 03/10/2019   Cough 06/26/2018   PND (post-nasal drip) 06/26/2018   Lumbar strain, initial encounter 04/10/2018   Thoracic myofascial strain 04/10/2018   Vitamin D deficiency 07/04/2017   B12 deficiency 07/04/2017   Abnormal CT scan 06/19/2017   Gastroesophageal reflux disease 06/19/2017   Antiplatelet or antithrombotic long-term use 06/19/2017   Nasal congestion 03/15/2017   Graves disease 02/01/2017   Sleep difficulties 02/01/2017   No energy 02/01/2017   Osteopenia 02/01/2017   Bilateral hand pain 07/16/2016   Acquired trigger finger 07/16/2016   Hypertension, essential 05/08/2016   Right foot pain 08/30/2014   Shoulder pain, right 08/30/2014   Chronic pain syndrome 01/15/2014   Multiple joint pain 01/15/2014   Lesion of lower eyelid 04/21/2013   Generalized constriction of visual field 03/31/2013   Neoplasm of uncertain behavior of skin of eyelid 03/31/2013   Posterior capsular opacification 03/31/2013   Cyst of left lower eyelid 03/30/2013   Pseudophakia of both eyes 03/30/2013   Prurigo nodularis 02/05/2013   Stasis dermatitis 02/05/2013   Psoriasis 01/21/2013   Trochanteric bursitis 11/10/2012   Achilles tendinitis 07/11/2012   Epiretinal membrane 06/17/2012   Status post cataract extraction 04/30/2012   Pes equinus, acquired 04/23/2012   Tendinitis of ankle 04/23/2012   Proliferative diabetic retinopathy of both eyes (HCC) 01/11/2012   Ongoing use of possibly toxic medication 12/05/2011   Hyperthyroidism 11/09/2010   SINUS TACHYCARDIA 11/08/2010   Shortness of breath 11/08/2010   Acute non-recurrent maxillary sinusitis 10/16/2010   DERMATITIS, ALLERGIC 07/20/2010   EDEMA 07/12/2010   Dizziness 11/14/2009   ATRIAL FIBRILLATION 07/20/2009   Hx of CABG 06/07/2009   ANGINA, STABLE/EXERTIONAL 06/02/2009   Dyslipidemia, goal LDL below 70 04/26/2009   ACUT MI ANTEROLAT WALL SUBSQT EPIS CARE 04/26/2009   Chest pain 04/26/2009    DIABETIC  RETINOPATHY 04/25/2009   CARPAL TUNNEL SYNDROME, BILATERAL 04/25/2009   TRIGGER FINGER 04/25/2009   MIGRAINE W/O AURA W/INTRACT W/STATUS MIGRAINOSUS 02/19/2008   ALLERGIC RHINITIS, SEASONAL 10/16/2006   CHOLELITHIASIS 10/16/2006   Rheumatoid arthritis (HCC) 10/16/2006    Past Medical History:  Diagnosis Date   ACUT MI ANTEROLAT WALL SUBSQT EPIS CARE    Acute maxillary sinusitis    ALLERGIC RHINITIS, SEASONAL    Anemia    Arthritis    ARTHRITIS, RHEUMATOID    shoulders and hands Enbrel>leg swelling Dr. Corliss Skains   Atrial fibrillation [facility])    a. after CABG.   Back pain    Bruxism (teeth grinding)    CAD, ARTERY BYPASS GRAFT    a. DES to RCA in 2010 then LAD occlusion s/p CABG 3 06/07/2009 with LIMA to LAD, reverse SVG to D1, reverse SVG to distal RCA. b. Cath 05/08/2016 slightly hypodense region in the intermediate branch, however she had excellent flow, FFR was normal. Vein graft to PDA and the posterolateral branch is patent, patent LIMA to LAD, occluded SVG to diagonal.   CARPAL TUNNEL SYNDROME, BILATERAL    CHOLELITHIASIS    Contrast media allergy    DERMATITIS, ALLERGIC    DIABETES MELLITUS, TYPE I    on insulin pump dx'ed age 74 y.o  DIABETIC  RETINOPATHY    Graves' disease    Heart attack (HCC)    Hiatal hernia    HYPERLIPIDEMIA-MIXED    Hypertension    HYPERTHYROIDISM    Dr. Osborne Casco   IBS (irritable bowel syndrome)    IDA (iron deficiency anemia)    Infertility, female    Insulin pump in place    Lymphadenopathy of head and neck    MIGRAINE W/O AURA W/INTRACT W/STATUS MIGRAINOSUS 02/19/2008   PONV (postoperative nausea and vomiting)    Psoriasis    Rheumatoid arthritis (HCC)    SINUS TACHYCARDIA 11/08/2010   Sleep apnea    not using machine yet    Stomach ulcer    SVT (supraventricular tachycardia) (HCC)    after s/p CABG   Tendonitis    TRIGGER FINGER    all fingers b/l hands    URI     Family History  Problem Relation Age of Onset    Depression Mother    Anxiety disorder Mother    Hypertension Mother    Hyperlipidemia Mother    Thyroid disease Mother    Bipolar disorder Mother    Sleep apnea Mother    Eating disorder Mother    Colon polyps Father    Diabetes Father        2   Hypertension Father    Parkinson's disease Father    Hyperlipidemia Father    Depression Father    Sleep apnea Father    Obesity Father    Stroke Paternal Grandmother    Heart disease Other    Hypertension Other    Hyperlipidemia Other    Depression Other    Migraines Other    Heart attack Neg Hx    Colon cancer Neg Hx    Stomach cancer Neg Hx    Esophageal cancer Neg Hx    Past Surgical History:  Procedure Laterality Date   ABDOMINAL HYSTERECTOMY     endometriomas b/l; total ? cervix removed; no h/o abnormal pap   Caesarean section     CARDIAC CATHETERIZATION N/A 05/08/2016   Procedure: Left Heart Cath and Cors/Grafts Angiography;  Surgeon: Kathleene Hazel, MD;  Location: [facility];  Service: Cardiovascular;  Laterality: N/A;   CARPAL TUNNEL RELEASE     b/l    CARPAL TUNNEL RELEASE Left 06/06/2021   Procedure: CARPAL TUNNEL RELEASE;  Surgeon: Cindee Salt, MD;  Location: [facility];  Service: Orthopedics;  Laterality: Left;  AXILLARY BLOCK   CATARACT EXTRACTION, BILATERAL     CHOLECYSTECTOMY     COLONOSCOPY WITH PROPOFOL N/A 03/25/2020   Procedure: COLONOSCOPY WITH PROPOFOL;  Surgeon: Wyline Mood, MD;  Location: [facility] ENDOSCOPY;  Service: Gastroenterology;  Laterality: N/A;   CORONARY ARTERY BYPASS GRAFT     ESOPHAGOGASTRODUODENOSCOPY (EGD) WITH PROPOFOL N/A 03/25/2020   Procedure: ESOPHAGOGASTRODUODENOSCOPY (EGD) WITH PROPOFOL;  Surgeon: Wyline Mood, MD;  Location: [facility] END[facility]COPY;  Service: Gastroenterology;  Laterality: N/A;   EYE SURGERY     laser x 2 for retinopathy    LEFT HEART CATH AND CORS/GRAFTS ANGIOGRAPHY N/A 02/22/2020   Procedure: LEFT HEART CATH AND CORS/GRAFTS ANGIOGRAPHY;  Surgeon:  Kathleene Hazel, MD;  Location: [facility];  Service: Cardiovascular;  Laterality: N/A;   TRIGGER FINGER RELEASE     b/l fingers all    ULNAR NERVE TRANSPOSITION Left 06/06/2021   Procedure: ULNAR NERVE DECOMPRESSION;  Surgeon: Cindee Salt, MD;  Location: [facility];  Service: Orthopedics;  Laterality: Left;  AXILLARY BLOCK   VITRECTOMY     b/l    Social History   Social History Narrative   Divorced. Has 2 kids(fraternal twins) daughters age 89. Works at  [employer], Never smoked, denies ETOH, no drugs. Drinks diet coke. No exercise.       DPR daughters Efraim Kaufmann and Kacyn Fujii twins          Immunization History  Administered Date(s) Administered   Influenza Inj Mdck Quad With Preservative 09/30/2014   Influenza, Quadrivalent, Recombinant, Inj, Pf 10/14/2013   Influenza, Seasonal, Injecte, Preservative Fre 09/19/2016   Influenza,inj,Quad PF,6+ Mos 09/30/2014, 09/23/2017, 09/03/2018, 10/02/2022   Influenza,inj,quad, With Preservative 09/23/2017   Influenza-Unspecified 09/30/2014, 09/30/2016, 09/03/2018, 09/10/2019, 09/28/2020, 09/28/2021, 10/03/2023   PFIZER(Purple Top)SARS-COV-2 Vaccination 12/28/2019, 01/18/2020   Pneumococcal Conjugate-13 07/16/2016   Pneumococcal Polysaccharide-23 01/01/2004, 10/15/2006, 01/04/2020   Td 10/01/2003   Tdap 01/09/2011, 06/25/2018   Zoster Recombinant(Shingrix) 09/14/2020, 12/15/2020     Objective: Vital Signs: BP (!) 150/83 (BP Location: Left Arm, Patient Position: Sitting, Cuff Size: Normal)   Pulse 69   Resp 15   Ht 5\' 2"  (1.575 m)   Wt 178 lb (80.7 kg)   BMI 32.56 kg/m    Physical Exam Vitals and nursing note reviewed.  Constitutional:      Appearance: She is well-developed.  HENT:     Head: Normocephalic and atraumatic.  Eyes:     Conjunctiva/sclera: Conjunctivae normal.  Cardiovascular:     Rate and Rhythm: Normal rate and regular rhythm.     Heart sounds: Normal heart sounds.  Pulmonary:      Effort: Pulmonary effort is normal.     Breath sounds: Normal breath sounds.  Abdominal:     General: Bowel sounds are normal.     Palpations: Abdomen is soft.  Musculoskeletal:     Cervical back: Normal range of motion.  Lymphadenopathy:     Cervical: No cervical adenopathy.  Skin:    General: Skin is warm and dry.     Capillary Refill: Capillary refill takes less than 2 seconds.  Neurological:     Mental Status: She is alert and oriented to person, place, and time.  Psychiatric:        Behavior: Behavior normal.      Musculoskeletal Exam: Patient had limited painful range of motion of the cervical spine.  She had discomfort at the base of her cervical spine.  She had limited painful range of motion of her thoracic spine in the lumbar spine.  Shoulder joints, elbow joints, wrist joints, MCPs PIPs and DIPs were in good range of motion with no synovitis.  Bilateral PIP and DIP thickening was noted.  Hip joints and knee joints were in good range of motion without any warmth swelling or effusion.  There was no tenderness over ankles or MTPs.  CDAI Exam: CDAI Score: 11  Patient Global: 50 / 100; Provider Global: 20 / 100 Swollen: 0 ; Tender: 5  Joint Exam 12/10/2023      Right  Left  PIP 2 (finger)   Tender     PIP 3 (finger)   Tender   Tender  PIP 4 (finger)      Tender  Cervical Spine   Tender        Investigation: No additional findings.  Imaging: No results found.  Recent Labs: Lab Results  Component Value Date   WBC 3.4 (L) 09/18/2023   HGB 11.1 (L) 09/18/2023   PLT 188 09/18/2023  NA 140 11/26/2023   K 3.9 11/26/2023   CL 103 11/26/2023   CO2 24 11/26/2023   GLUCOSE 91 11/26/2023   BUN 10 11/26/2023   CREATININE 0.75 11/26/2023   BILITOT 0.3 11/26/2023   ALKPHOS 76 11/26/2023   AST 24 11/26/2023   ALT 15 11/26/2023   PROT 6.1 11/26/2023   ALBUMIN 4.4 11/26/2023   CALCIUM 9.1 11/26/2023   GFRAA 86 04/07/2021   QFTBGOLDPLUS NEGATIVE 10/31/2021      Speciality Comments: TB Gold: 10/10/2022 Neg  PLQ Eye Exam: 01/06/2020 ABNORMAL @ [facility]. Patient advised to discontinue PLQ at office visit on 01/06/2020.  Prior Therapy: methotrexate/Xeljanz/Actemra (GI upset), Enbrel (injection site reaction) and Orencia/Remicade/Humira/Cimzia/Rituxan/Simponi/Simponi Aria (inadequate response).  Procedures:  No procedures performed Allergies: Actemra [tocilizumab], Ramipril, Shellfish-derived products, Atorvastatin, Compazine  [prochlorperazine edisylate], Emgality [galcanezumab-gnlm], Etanercept, Infliximab, Iohexol, Orencia [abatacept], Prochlorperazine edisylate, Tofacitinib, Trokendi xr [topiramate er], Tramadol, Amiodarone, and Rituximab   Assessment / Plan:     Visit Diagnoses: Rheumatoid arthritis of multiple sites with negative rheumatoid factor (HCC) - Ultrasound of both hands negative for synovitis and tenosynovitis on 03/14/2023.  Patient continues to have pain and discomfort in her bilateral hands.  No synovitis was noted.  Bilateral PIP and DIP thickening was noted.  She continues to be on Rinvoq and leflunomide without any interruption.  High risk medication use - Rinvoq 15 mg 1 tablet by mouth once daily and Arava 20 mg 1 tablet by mouth daily.September 18, 2023 WBC 3.4, hemoglobin 11.1, platelets 188 November 26, 2023 CMP normal, vitamin D 92.9, TSH normal, LDL 69.  Labs were reviewed with the patient.  Patient stated that she will forward TB Gold result to [place].  According to her TB Gold test was negative at her work.  Information for immunization was placed in the AVS.  She was advised to hold Rinvoq and leflunomide if she develops an infection resume after the infection resolves.  FDA blackbox warning regarding Mace events associated with Rinvoq were also reviewed.  Patient voiced understanding.  A handout was placed in the AVS.  Psoriasis-she had no active lesions.  Contracture of joint of both elbows-unchanged  without any synovitis.  Chronic pain of right knee-she has not mitten discomfort in her knee joints.  No warmth swelling or effusion was noted.  Neck pain -she has been having increased pain and stiffness in her cervical spine.  Patient states she had injections by Dr. Altamese Dilling without any relief.  I will obtain x-rays.  Patient would prefer to have x-rays at [facility] regional.  Plan: DG Cervical Spine With Flex & Extend.  Will contact her once the x-ray results are available.  DDD (degenerative disc disease), thoracic - Postural thoracic kyphosis noted.  Spondylosis of lumbar spine -she continues to have lower back pain.  She has under the care of Dr. Shon Baton.  She takes flexeril 10 mg 1 tablet by mouth at bedtime for muscle spasms and insomnia.  Osteopenia of multiple sites - DEXA on 11/11/19: The BMD measured at Femur Neck Left is 0.901 g/cm2 with a T-score of -1.0-Normal. Due to update DEXA in November 2025.  Piriformis syndrome of right side - under care of Dr. Shon Baton.  Other fatigue-she has been experiencing increased fatigue.  Other insomnia - Flexeril 10 mg at bedtime for muscle spasms and insomnia.  History of vitamin D deficiency-patient has been taking vitamin D 5000 units daily.  Her vitamin D was high normal at 92.9.  I advised  her to take vitamin D every other day.  History of gastroesophageal reflux (GERD)  History of Graves' disease  History of coronary artery disease  History of hyperlipidemia  History of diabetes mellitus  History of hypertension  History of migraine    Orders: Orders Placed This Encounter  Procedures   DG Cervical Spine With Flex & Extend   No orders of the defined types were placed in this encounter.    Follow-Up Instructions: Return in about 5 months (around 05/09/2024) for Rheumatoid arthritis, Osteoarthritis.   Pollyann Savoy, MD  Note - This record has been created using Animal nutritionist.  Chart creation errors have been  sought, but may not always  have been located. Such creation errors do not reflect on  the standard of medical care.

## 2023-12-01 ENCOUNTER — Other Ambulatory Visit: Payer: Self-pay

## 2023-12-01 MED FILL — Cyclobenzaprine HCl Tab 10 MG: ORAL | 30 days supply | Qty: 30 | Fill #2 | Status: CN

## 2023-12-02 ENCOUNTER — Other Ambulatory Visit: Payer: Self-pay

## 2023-12-04 ENCOUNTER — Other Ambulatory Visit: Payer: Self-pay

## 2023-12-10 ENCOUNTER — Encounter: Payer: Self-pay | Admitting: Rheumatology

## 2023-12-10 ENCOUNTER — Ambulatory Visit: Payer: Commercial Managed Care - PPO | Attending: Rheumatology | Admitting: Rheumatology

## 2023-12-10 VITALS — BP 150/83 | HR 69 | Resp 15 | Ht 62.0 in | Wt 178.0 lb

## 2023-12-10 DIAGNOSIS — L409 Psoriasis, unspecified: Secondary | ICD-10-CM

## 2023-12-10 DIAGNOSIS — G5701 Lesion of sciatic nerve, right lower limb: Secondary | ICD-10-CM

## 2023-12-10 DIAGNOSIS — Z8639 Personal history of other endocrine, nutritional and metabolic disease: Secondary | ICD-10-CM

## 2023-12-10 DIAGNOSIS — G8929 Other chronic pain: Secondary | ICD-10-CM

## 2023-12-10 DIAGNOSIS — M0609 Rheumatoid arthritis without rheumatoid factor, multiple sites: Secondary | ICD-10-CM

## 2023-12-10 DIAGNOSIS — Z8669 Personal history of other diseases of the nervous system and sense organs: Secondary | ICD-10-CM

## 2023-12-10 DIAGNOSIS — Z8679 Personal history of other diseases of the circulatory system: Secondary | ICD-10-CM

## 2023-12-10 DIAGNOSIS — M47816 Spondylosis without myelopathy or radiculopathy, lumbar region: Secondary | ICD-10-CM | POA: Diagnosis not present

## 2023-12-10 DIAGNOSIS — R5383 Other fatigue: Secondary | ICD-10-CM

## 2023-12-10 DIAGNOSIS — M5134 Other intervertebral disc degeneration, thoracic region: Secondary | ICD-10-CM

## 2023-12-10 DIAGNOSIS — M8589 Other specified disorders of bone density and structure, multiple sites: Secondary | ICD-10-CM | POA: Diagnosis not present

## 2023-12-10 DIAGNOSIS — M24522 Contracture, left elbow: Secondary | ICD-10-CM

## 2023-12-10 DIAGNOSIS — Z79899 Other long term (current) drug therapy: Secondary | ICD-10-CM | POA: Diagnosis not present

## 2023-12-10 DIAGNOSIS — M25561 Pain in right knee: Secondary | ICD-10-CM | POA: Diagnosis not present

## 2023-12-10 DIAGNOSIS — Z8719 Personal history of other diseases of the digestive system: Secondary | ICD-10-CM

## 2023-12-10 DIAGNOSIS — M24521 Contracture, right elbow: Secondary | ICD-10-CM | POA: Diagnosis not present

## 2023-12-10 DIAGNOSIS — G4709 Other insomnia: Secondary | ICD-10-CM

## 2023-12-10 DIAGNOSIS — M542 Cervicalgia: Secondary | ICD-10-CM

## 2023-12-10 NOTE — Patient Instructions (Addendum)
 Standing Labs We placed an order today for your standing lab work.   Please have your standing labs drawn in February and every 3 months  Please have your labs drawn 2 weeks prior to your appointment so that the provider can discuss your lab results at your appointment, if possible.  Please note that you may see your imaging and lab results in MyChart before we have reviewed them. We will contact you once all results are reviewed. Please allow our office up to 72 hours to thoroughly review all of the results before contacting the office for clarification of your results.  WALK-IN LAB HOURS  Monday through Thursday from 8:00 am -12:30 pm and 1:00 pm-5:00 pm and Friday from 8:00 am-12:00 pm.  Patients with office visits requiring labs will be seen before walk-in labs.  You may encounter longer than normal wait times. Please allow additional time. Wait times may be shorter on  Monday and Thursday afternoons.  We do not book appointments for walk-in labs. We appreciate your patience and understanding with our staff.   Labs are drawn by [facility]. Please bring your co-pay at the time of your lab draw.  You may receive a bill from [facility] for your lab work.  Please note if you are on Hydroxychloroquine and and an order has been placed for a Hydroxychloroquine level,  you will need to have it drawn 4 hours or more after your last dose.  If you wish to have your labs drawn at another location, please call the office 24 hours in advance so we can fax the orders.  The office is located at [address], [address], [place], [place] [address]   If you have any questions regarding directions or hours of operation,  please call [phone].   As a reminder, please drink plenty of water prior to coming for your lab work. Thanks!   Vaccines You are taking a medication(s) that can suppress your immune system.  The following immunizations are recommended: Flu annually Covid-19  RSV Td/Tdap (tetanus,  diphtheria, pertussis) every 10 years Pneumonia (Prevnar 15 then Pneumovax 23 at least 1 year apart.  Alternatively, can take Prevnar 20 without needing additional dose) Shingrix: 2 doses from 4 weeks to 6 months apart  Please check with your PCP to make sure you are up to date.   If you have signs or symptoms of an infection or start antibiotics: First, call your PCP for workup of your infection. Hold your medication through the infection, until you complete your antibiotics, and until symptoms resolve if you take the following: Injectable medication (Actemra, Benlysta, Cimzia, Cosentyx, Enbrel, Humira, Kevzara, Orencia, Remicade, Simponi, Stelara, Taltz, Tremfya) Methotrexate Leflunomide (Arava) Mycophenolate (Cellcept) Osborne Oman, or Rinvoq   Because you are taking Harriette Ohara, Rinvoq, or Olumiant, it is very important to know that this class of medications has a FDA BLACK BOX WARNING for major adverse cardiovascular events (MACE), thrombosis, mortality (including sudden cardiovascular death), serious infections, and lymphomas. MACE is defined as cardiovascular death, myocardial infarction, and stroke. Thrombosis includes deep venous thrombosis (DVT), pulmonary embolism (PE), and arterial thrombosis. If you are a current or former smoker, you are at higher risk for MACE.

## 2023-12-11 ENCOUNTER — Ambulatory Visit (INDEPENDENT_AMBULATORY_CARE_PROVIDER_SITE_OTHER): Payer: Commercial Managed Care - PPO | Admitting: Family Medicine

## 2023-12-11 ENCOUNTER — Encounter (INDEPENDENT_AMBULATORY_CARE_PROVIDER_SITE_OTHER): Payer: Self-pay | Admitting: Family Medicine

## 2023-12-11 VITALS — BP 118/71 | HR 79 | Temp 98.2°F | Ht 62.5 in | Wt 174.0 lb

## 2023-12-11 DIAGNOSIS — E1059 Type 1 diabetes mellitus with other circulatory complications: Secondary | ICD-10-CM | POA: Diagnosis not present

## 2023-12-11 DIAGNOSIS — E782 Mixed hyperlipidemia: Secondary | ICD-10-CM | POA: Insufficient documentation

## 2023-12-11 DIAGNOSIS — E559 Vitamin D deficiency, unspecified: Secondary | ICD-10-CM

## 2023-12-11 DIAGNOSIS — E1159 Type 2 diabetes mellitus with other circulatory complications: Secondary | ICD-10-CM

## 2023-12-11 DIAGNOSIS — E108 Type 1 diabetes mellitus with unspecified complications: Secondary | ICD-10-CM

## 2023-12-11 DIAGNOSIS — E1069 Type 1 diabetes mellitus with other specified complication: Secondary | ICD-10-CM | POA: Diagnosis not present

## 2023-12-11 DIAGNOSIS — Z8639 Personal history of other endocrine, nutritional and metabolic disease: Secondary | ICD-10-CM | POA: Diagnosis not present

## 2023-12-11 DIAGNOSIS — Z6831 Body mass index (BMI) 31.0-31.9, adult: Secondary | ICD-10-CM | POA: Diagnosis not present

## 2023-12-11 DIAGNOSIS — I152 Hypertension secondary to endocrine disorders: Secondary | ICD-10-CM | POA: Diagnosis not present

## 2023-12-11 DIAGNOSIS — Z794 Long term (current) use of insulin: Secondary | ICD-10-CM | POA: Diagnosis not present

## 2023-12-11 DIAGNOSIS — E66811 Obesity, class 1: Secondary | ICD-10-CM | POA: Diagnosis not present

## 2023-12-11 MED ORDER — VITAMIN D3 125 MCG (5000 UT) PO CAPS: ORAL_CAPSULE | ORAL | Status: AC

## 2023-12-11 NOTE — Progress Notes (Incomplete)
[name], D.O.  ABFM, ABOM Clinical Bariatric Medicine Physician  Office located at: [address] [place], [place]  [address]     Assessment and Plan:   FOR THE DISEASE OF OBESITY: Assessment & Plan: BMI 31.0-31.9,adult Obesity, current BMI 31.4 Since last office visit on 11/26/23 patient's muscle mass has increased by 1lb. Fat mass has decreased by 5.2lb. Total body water has increased by 0.2lb.  Counseling done on how various foods will affect these numbers and how to maximize success  Total lbs lost to date: 4 lbs Total weight loss percentage to date: -2.25%   Recommended Dietary Goals [name] is currently in the action stage of change. As such, her goal is to continue weight management plan. She has agreed to: follow our protein rich vegetarian plan and journaling 1150-1250 calories with 80+ g of protein daily  I recommend she try 20-30-40 method to meet her protein goal on a daily basis. Journal using Lose It!/My Net Diary. Start measuring proteins and vegetables at meal time.   Behavioral Intervention We discussed the following Behavioral Modification Strategies today: increasing lean protein intake to established goals, avoiding skipping meals, increasing water intake , work on tracking and journaling calories using tracking application, work on managing stress, creating time for self-care and relaxation, continue to practice mindfulness when eating, and celebration eating strategies  Additional resources provided today: Handout on traveling and holiday eating strategies and journaling   Evidence-based interventions for health behavior change were utilized today including the discussion of self monitoring techniques, problem-solving barriers and SMART goal setting techniques.   Regarding patient's less desirable eating habits and patterns, we employed the technique of small changes.   Pt will specifically work on: carrying glucose packets with her at all times,  avoid skipping meals, eating protein at all meals, and journaling using vegetarian plan as a guide for next visit.   Recommended Physical Activity Goals [name] has been advised to work up to 150 minutes of moderate intensity aerobic activity a week and strengthening exercises 2-3 times per week for cardiovascular health, weight loss maintenance and preservation of muscle mass.   She has agreed to :  Increase physical activity in their day and reduce sedentary time (increase NEAT).   Pharmacotherapy We discussed various medication options to help [name] with her weight loss efforts and we both agreed to : continue with nutritional and behavioral strategies   FOR ASSOCIATED CONDITIONS ADDRESSED TODAY: Type 1 diabetes mellitus with complications [facility]) Assessment & Plan: Lab Results  Component Value Date   HGBA1C 6.3 (A) 09/26/2023   HGBA1C 7.0 (H) 05/15/2022   HGBA1C 6.8 (H) 01/11/2022   INSULIN <0.4 (L) 11/26/2023    Pt has insulin pump managed by Dr. Elvera Lennox of endocrinology. Lowest reading she reports is 43 - while at work. She has not been bolising herself or using any extra insulin daily. She reports recently purchasing a bottle of glucose gummies to keep at work for emergencies. Has not been skipping meals. Dexcom only provides average of readings, per pt are overall well controlled.   Follow up with Dr. Lafe [name] for possible modifications to her insulin pump settings and stressed the urgency of this. Pt  verbalized understanding. I recommend she always carry a glucose packet with her in her pocket/purse at all times in the case of emergencies if her sugars drop too low. [name] was educated on the importance of increasing lean protein intake to promote stabilizing of sugars. Continue to avoid skipping  meals and going without eating for long periods. We will continue to monitor her condition alongside PCP/specialists.    Mixed diabetic hyperlipidemia associated with type 1 diabetes mellitus  [facility]) Assessment & Plan: Lab Results  Component Value Date   CHOL 162 11/26/2023   HDL 76 11/26/2023   LDLCALC 69 11/26/2023   TRIG 96 11/26/2023   CHOLHDL 2 12/05/2022   Since her last visit on 11/26/23, her visceral fat percentage has reduced from 12 to 11 today. As of 11/26/23, her LDL and HDL are at goal at 68 and 76 respectively. Currently treating condition with Crestor 20 mg and krill oil 500 mg once daily.   Reviewed ideal goals of LDL of 70 and below and HLD of 60 and above. Continue on current medication and supplementation regimen per PCP/specialists. Continue to prioritize increasing protein intake and exercise. Will monitor condition as it relates to her weight loss journey.    Hypertension associated with diabetes [facility]) Assessment & Plan: BP Readings from Last 3 Encounters:  12/11/23 118/71  12/10/23 (!) 150/83  11/26/23 138/83    Stable today at 118/71. [name] monitors her BP at home and reports a systolic reading of 150 yesterday while at rheumatology office visit. Pt on Idur 60 mg once daily, Losartan 100 once daily, and Lopressor 25 mg BID.   Continue to monitor BP closely. Reminded patient that if they ever feel poorly in any way, to check their blood pressure and pulse as well. Continue on current antihypertensive regimen as instructed by PCP/specialists. We will continue to monitor closely alongside PCP/ specialists.  Pt reminded to also f/up with those individuals as instructed by them.    Vitamin D deficiency Assessment & Plan: Lab Results  Component Value Date   VD25OH 92.9 11/26/2023   VD25OH 74.19 12/05/2022   VD25OH 80.30 05/15/2022   Vitamin D slightly elevated as of 11/26/23. Was taking vitamin D 5000 units once daily and yesterday, under the direction of her rheumatologist, she has started taking every other day. She is established with Dr. Bedelia Person of rheumatology.  Continue current supplementation regimen as directed by specialists, decreased to  once every other day. We will continue to monitor alongside PCP/specialists. Refill vitamin D today.   Orders: -     Vitamin D3; Decrease to 1 tab every other day   H/O Graves' disease Assessment & Plan: Reports history of Grave's disease. Not managing with medication. Has been in remission twice. Established with Dr. Elvera Lennox of endocrinology. Euthyroid for the last 2-3 years. Reports about 2-3 years ago had "thyroid storm" and she was taken off supplements since then.   Continue to follow up with PCP/specialists for management of this condition. We will continue to monitor her condition alongside these individuals as it relates to her weight loss journey.    FOLLOW UP:   Return in about 22 days (around 01/02/2024). She was informed of the importance of frequent follow up visits to maximize her success with intensive lifestyle modifications for her multiple health conditions.   Subjective:   Chief complaint: Obesity [name] is here to discuss her progress with her obesity treatment plan. She is on the Vegetarian Plan and states she is following her eating plan approximately 80 % of the time. She states she is not exercising.  Interval History:  [name] is here today for her first follow-up office visit since starting the program with [place]. Since last office visit she is down 4 lbs. She has been prioritizing her  protein intake with cheese sticks, yogurt, cottage cheese with fruit, and cheese sandwiches. She states dinner is difficult for her to eat due to decreased appetite. Protein at lunch tend to be salads with eggs and 1/2 cup of beans, and cheese in salads.  All blood work/ lab tests that were recently ordered by myself or an outside provider were reviewed with patient today per their request. Extended time was spent counseling her on all new disease processes that were discovered or preexisting ones that are affected by BMI.  she understands that many of these abnormalities will need  to monitored regularly along with the current treatment plan of prudent dietary changes, in which we are making each and every office visit, to improve these health parameters.  Pharmacotherapy for weight loss: She is not currently taking medications  for medical weight loss.  Denies side effects.    Review of Systems:  Pertinent positives were addressed with patient today.  Reviewed by clinician on day of visit: allergies, medications, problem list, medical history, surgical history, family history, social history, and previous encounter notes.   Weight Summary and Biometrics   Weight Lost Since Last Visit: 4 lb  Weight Gained Since Last Visit: 0   Vitals Temp: 98.2 F (36.8 C) BP: 118/71 Pulse Rate: 79 SpO2: 98 %   Anthropometric Measurements Height: 5' 2.5" (1.588 m) Weight: 174 lb (78.9 kg) BMI (Calculated): 31.3 Weight at Last Visit: 178 lb Weight Lost Since Last Visit: 4 lb Weight Gained Since Last Visit: 0 Starting Weight: 178 lb Total Weight Loss (lbs): 4 lb (1.814 kg) Peak Weight: 190 lb   Body Composition  Body Fat %: 45.2 % Fat Mass (lbs): 78.8 lbs Muscle Mass (lbs): 90.8 lbs Total Body Water (lbs): 74 lbs Visceral Fat Rating : 11   Other Clinical Data RMR: 1627 Fasting: no Labs: no Today's Visit #: 2 Starting Date: 11/26/23     Objective:   PHYSICAL EXAM:  Blood pressure 118/71, pulse 79, temperature 98.2 F (36.8 C), height 5' 2.5" (1.588 m), weight 174 lb (78.9 kg), SpO2 98%. Body mass index is 31.32 kg/m.  General: she is overweight, cooperative and in no acute distress.   HEENT: EOMI, sclerae are anicteric. Lungs: Normal breathing effort, no conversational dyspnea. M-Sk:  Normal gross ROM * 4 extremities  PSYCH: Has normal mood, affect and thought process. Neurologic: No gross sensory or motor deficits. Well developed, A and O * 3  DIAGNOSTIC DATA REVIEWED:  BMET    Component Value Date/Time   NA 140 11/26/2023 1123   K 3.9  11/26/2023 1123   CL 103 11/26/2023 1123   CO2 24 11/26/2023 1123   GLUCOSE 91 11/26/2023 1123   GLUCOSE 159 (H) 09/12/2023 1548   BUN 10 11/26/2023 1123   CREATININE 0.75 11/26/2023 1123   CREATININE 0.80 09/12/2023 1548   CALCIUM 9.1 11/26/2023 1123   GFRNONAA >60 06/01/2021 0757   GFRNONAA 74 04/07/2021 0855   GFRAA 86 04/07/2021 0855   Lab Results  Component Value Date   HGBA1C 6.3 (A) 09/26/2023   HGBA1C 6.8 06/26/2007   Lab Results  Component Value Date   INSULIN <0.4 (L) 11/26/2023   Lab Results  Component Value Date   TSH 2.050 11/26/2023   CBC    Component Value Date/Time   WBC 3.4 (L) 09/18/2023 1447   WBC 3.8 09/12/2023 1548   RBC 3.71 (L) 09/18/2023 1447   HGB 11.1 (L) 09/18/2023 1447   HGB 13.4 02/15/2020 1656  HCT 33.5 (L) 09/18/2023 1447   HCT 38.9 02/15/2020 1656   PLT 188 09/18/2023 1447   PLT 172 02/15/2020 1656   MCV 90.3 09/18/2023 1447   MCV 87 02/15/2020 1656   MCH 29.9 09/18/2023 1447   MCHC 33.1 09/18/2023 1447   RDW 12.7 09/18/2023 1447   RDW 12.8 02/15/2020 1656   Iron Studies    Component Value Date/Time   IRON 83 09/18/2023 1447   TIBC 406 09/18/2023 1447   FERRITIN 58 09/18/2023 1447   IRONPCTSAT 20 09/18/2023 1447   Lipid Panel     Component Value Date/Time   CHOL 162 11/26/2023 1123   TRIG 96 11/26/2023 1123   HDL 76 11/26/2023 1123   CHOLHDL 2 12/05/2022 1558   VLDL 19.0 12/05/2022 1558   LDLCALC 69 11/26/2023 1123   Hepatic Function Panel     Component Value Date/Time   PROT 6.1 11/26/2023 1123   ALBUMIN 4.4 11/26/2023 1123   AST 24 11/26/2023 1123   ALT 15 11/26/2023 1123   ALKPHOS 76 11/26/2023 1123   BILITOT 0.3 11/26/2023 1123   BILIDIR 0.1 11/08/2010 1257   IBILI 0.4 07/05/2009 0743      Component Value Date/Time   TSH 2.050 11/26/2023 1123   Nutritional Lab Results  Component Value Date   VD25OH 92.9 11/26/2023   VD25OH 74.19 12/05/2022   VD25OH 80.30 05/15/2022    Attestations:   Reviewed  by clinician on day of visit: allergies, medications, problem list, medical history, surgical history, family history, social history, and previous encounter notes pertinent to patient's obesity diagnosis.   I have spent 40 minutes in the care of the patient today including: preparing to see patient (e.g. review and interpretation of tests, old notes ), obtaining and/or reviewing separately obtained history, performing a medically appropriate examination or evaluation, counseling and educating the patient, ordering medications, test or procedures, documenting clinical information in the electronic or other health care record, and independently interpreting results and communicating results to the patient, family, or caregiver   I, Isabelle Course, acting as a medical scribe for Thomasene Lot, DO., have compiled all relevant documentation for today's office visit on behalf of Thomasene Lot, DO, while in the presence of [employer], DO.  I have reviewed the above documentation for accuracy and completeness, and I agree with the above. [name], D.O.  The 21st Century Cures Act was signed into law in 2016 which includes the topic of electronic health records.  This provides immediate access to information in MyChart.  This includes consultation notes, operative notes, office notes, lab results and pathology reports.  If you have any questions about what you read please let us know at your next visit so we can discuss your concerns and take corrective action if need be.  We are right here with you.

## 2023-12-12 ENCOUNTER — Ambulatory Visit: Payer: Commercial Managed Care - PPO | Admitting: Nurse Practitioner

## 2023-12-12 ENCOUNTER — Encounter: Payer: Self-pay | Admitting: Nurse Practitioner

## 2023-12-12 VITALS — BP 134/86 | HR 85 | Temp 98.4°F | Ht 61.25 in | Wt 178.4 lb

## 2023-12-12 DIAGNOSIS — Z8639 Personal history of other endocrine, nutritional and metabolic disease: Secondary | ICD-10-CM

## 2023-12-12 DIAGNOSIS — G43011 Migraine without aura, intractable, with status migrainosus: Secondary | ICD-10-CM

## 2023-12-12 DIAGNOSIS — E0590 Thyrotoxicosis, unspecified without thyrotoxic crisis or storm: Secondary | ICD-10-CM

## 2023-12-12 DIAGNOSIS — I5032 Chronic diastolic (congestive) heart failure: Secondary | ICD-10-CM

## 2023-12-12 DIAGNOSIS — E782 Mixed hyperlipidemia: Secondary | ICD-10-CM

## 2023-12-12 DIAGNOSIS — Z6831 Body mass index (BMI) 31.0-31.9, adult: Secondary | ICD-10-CM

## 2023-12-12 DIAGNOSIS — E1069 Type 1 diabetes mellitus with other specified complication: Secondary | ICD-10-CM

## 2023-12-12 DIAGNOSIS — Z0000 Encounter for general adult medical examination without abnormal findings: Secondary | ICD-10-CM

## 2023-12-12 DIAGNOSIS — Z1231 Encounter for screening mammogram for malignant neoplasm of breast: Secondary | ICD-10-CM

## 2023-12-12 DIAGNOSIS — R7989 Other specified abnormal findings of blood chemistry: Secondary | ICD-10-CM

## 2023-12-12 DIAGNOSIS — M8580 Other specified disorders of bone density and structure, unspecified site: Secondary | ICD-10-CM | POA: Diagnosis not present

## 2023-12-12 DIAGNOSIS — E1059 Type 1 diabetes mellitus with other circulatory complications: Secondary | ICD-10-CM

## 2023-12-12 DIAGNOSIS — G4733 Obstructive sleep apnea (adult) (pediatric): Secondary | ICD-10-CM

## 2023-12-12 DIAGNOSIS — E1159 Type 2 diabetes mellitus with other circulatory complications: Secondary | ICD-10-CM

## 2023-12-12 DIAGNOSIS — E108 Type 1 diabetes mellitus with unspecified complications: Secondary | ICD-10-CM

## 2023-12-12 DIAGNOSIS — I152 Hypertension secondary to endocrine disorders: Secondary | ICD-10-CM

## 2023-12-12 NOTE — Assessment & Plan Note (Signed)
History of the same less frequent since hysterectomy.  Stable

## 2023-12-12 NOTE — Progress Notes (Signed)
Established Patient Office Visit  Subjective   Patient ID: [name], female    DOB: 1966-01-23  Age: 58 y.o. MRN: [id number]  Chief Complaint  Patient presents with   Annual Exam    HPI  for complete physical and follow up of chronic conditions.   Migriaine: less than once a month. Better since hystrectomy   HTN/CAD: Patient followed by Dr. Tonny Bollman.  She is status post CABG patient currently maintained on aspirin, clopidogrel, isosorbide, losartan, metoprolol, torsemide, rosuvastatin.  Patient is tolerating medications well.     WUJ:WJXB states that she has been using it   GERD: protonix does well. Does not have to avoid trigger foods.  States she has changed her diet since going to the healthy weight wellness and this has helped  Thryoid: Patient is being seen by Dr. Ernest Haber.  Devonshwar   DM1: Patient currently being seen by Dr. Shella Maxim she is type I diabetic maintained on insulin.  RA: Patient currently being seen by Dr. Dreama Saa.  Patient currently maintained on rinvoq  for complete physical and follow up of chronic conditions.  Immunizations: -Tetanus: Completed in 2019 -Influenza: Up-to-date through clear -Shingles: Completed Shingrix series -Pneumonia: Completed 2021  Diet: Fair diet. Seeing healthy weight and wellness. States trying to do 3 meals a day. She will drink diet coke and little water  Exercise: No regular exercise. States that her hips are bothering her.   Eye exam: Completes annually. Yearly Dr. Ashley Royalty retina. And Bulakowski for glasses  Dental exam: Completes semi-annually    Colonoscopy: Completed in 03/25/2020, repeat in 5 year  Lung Cancer Screening: N/A  Pap semar: Hysterectomy  Mammogram: Needs order due January 2025   DEXA: Needs order    Sleep: 9-10 and get up at 530. Does not feel rested.  Patient endorses wearing her CPAP as directed      Review of Systems  Constitutional:  Negative for  chills and fever.  Respiratory:  Negative for shortness of breath.   Cardiovascular:  Negative for chest pain and leg swelling.  Gastrointestinal:  Positive for constipation. Negative for abdominal pain, blood in stool, diarrhea, nausea and vomiting.       BM daily   Genitourinary:  Negative for dysuria and hematuria.  Neurological:  Negative for tingling and headaches.  Psychiatric/Behavioral:  Negative for hallucinations and suicidal ideas.       Objective:     BP 134/86   Pulse 85   Temp 98.4 F (36.9 C) (Oral)   Ht 5' 1.25" (1.556 m)   Wt 178 lb 6.4 oz (80.9 kg)   SpO2 98%   BMI 33.43 kg/m  BP Readings from Last 3 Encounters:  12/12/23 134/86  12/11/23 118/71  12/10/23 (!) 150/83   Wt Readings from Last 3 Encounters:  12/12/23 178 lb 6.4 oz (80.9 kg)  12/11/23 174 lb (78.9 kg)  12/10/23 178 lb (80.7 kg)   SpO2 Readings from Last 3 Encounters:  12/12/23 98%  12/11/23 98%  11/26/23 97%      Physical Exam Vitals and nursing note reviewed.  Constitutional:      Appearance: Normal appearance.  HENT:     Right Ear: Tympanic membrane, ear canal and external ear normal.     Left Ear: Tympanic membrane, ear canal and external ear normal.     Mouth/Throat:     Mouth: Mucous membranes are moist.     Pharynx: Oropharynx is clear.  Eyes:     Extraocular  Movements: Extraocular movements intact.     Pupils: Pupils are equal, round, and reactive to light.  Cardiovascular:     Rate and Rhythm: Normal rate and regular rhythm.     Pulses: Normal pulses.     Heart sounds: Normal heart sounds.  Pulmonary:     Effort: Pulmonary effort is normal.     Breath sounds: Normal breath sounds.  Abdominal:     General: Bowel sounds are normal. There is no distension.     Palpations: There is no mass.     Tenderness: There is no abdominal tenderness.     Hernia: No hernia is present.  Musculoskeletal:     Right lower leg: Edema present.     Left lower leg: Edema present.   Lymphadenopathy:     Cervical: No cervical adenopathy.  Skin:    General: Skin is warm.  Neurological:     General: No focal deficit present.     Mental Status: She is alert.     Deep Tendon Reflexes:     Reflex Scores:      Bicep reflexes are 2+ on the right side and 2+ on the left side.      Patellar reflexes are 2+ on the right side and 2+ on the left side.    Comments: Bilateral upper and lower extremity strength 5/5  Psychiatric:        Mood and Affect: Mood normal.        Behavior: Behavior normal.        Thought Content: Thought content normal.        Judgment: Judgment normal.      No results found for any visits on 12/12/23.    The ASCVD Risk score (Arnett DK, et al., 2019) failed to calculate for the following reasons:   Risk score cannot be calculated because patient has a medical history suggesting prior/existing ASCVD    Assessment & Plan:   Problem List Items Addressed This Visit       Cardiovascular and Mediastinum   MIGRAINE W/O AURA W/INTRACT W/STATUS MIGRAINOSUS   History of the same less frequent since hysterectomy.  Stable      Hypertension associated with diabetes [facility])   Patient is currently followed by cardiology currently maintained on isosorbide, losartan, metoprolol, torsemide.  Continue taking medications prescribed blood pressure controlled today in office      Chronic diastolic CHF (congestive heart failure) (HCC)   Patient currently followed by cardiology on metoprolol, isosorbide, losartan, torsemide.  Continue following with cardiology as recommended        Respiratory   OSA on CPAP   Patient currently maintained on CPAP therapy states she has been adherent as of late continue        Endocrine   Hyperthyroidism   Patient currently maintained on no medications currently she is followed by endocrinology.  Continue following with specialist as recommended      Type 1 diabetes mellitus with complications [facility])   Patient is  followed by endocrinology currently maintained on insulin continue taking medication as prescribed continue following with specialist as recommended      Mixed diabetic hyperlipidemia associated with type 1 diabetes mellitus (HCC)   Patient currently maintained on rosuvastatin continue medication as prescribed        Musculoskeletal and Integument   Osteopenia   Historical diagnosis.  Pending DEXA scan      Relevant Orders   DG Bone Density     Other   Preventative  health care - Primary   Discussed age-appropriate immunizations and screening exams.  Did review patient's personal, surgical, social, family histories.  Patient is up-to-date on all age-appropriate vaccinations she would like.  Patient up-to-date on CRC screening.  Mammogram and bone density scan placed for breast cancer screening and osteoporosis screening.  Patient no longer participates in cervical cancer screening with status post hysterectomy.  Patient was given information at discharge about preventative healthcare maintenance with anticipatory guidance      H/O Graves' disease   Abnormal CBC   Pending CBC today most recent iron B12 were all normal      Relevant Orders   CBC   Other Visit Diagnoses       BMI 31.0-31.9,adult         Screening mammogram for breast cancer       Relevant Orders   MM 3D SCREENING MAMMOGRAM BILATERAL BREAST       Return in about 1 year (around 12/11/2024) for CPE and Labs.    Audria Nine, NP

## 2023-12-12 NOTE — Assessment & Plan Note (Signed)
Patient currently followed by cardiology on metoprolol, isosorbide, losartan, torsemide.  Continue following with cardiology as recommended

## 2023-12-12 NOTE — Assessment & Plan Note (Signed)
Patient currently maintained on rosuvastatin continue medication as prescribed

## 2023-12-12 NOTE — Assessment & Plan Note (Signed)
Historical diagnosis.  Pending DEXA scan

## 2023-12-12 NOTE — Assessment & Plan Note (Signed)
Patient is followed by endocrinology currently maintained on insulin continue taking medication as prescribed continue following with specialist as recommended

## 2023-12-12 NOTE — Assessment & Plan Note (Signed)
Discussed age-appropriate immunizations and screening exams.  Did review patient's personal, surgical, social, family histories.  Patient is up-to-date on all age-appropriate vaccinations she would like.  Patient up-to-date on CRC screening.  Mammogram and bone density scan placed for breast cancer screening and osteoporosis screening.  Patient no longer participates in cervical cancer screening with status post hysterectomy.  Patient was given information at discharge about preventative healthcare maintenance with anticipatory guidance

## 2023-12-12 NOTE — Patient Instructions (Signed)
Nice to see you today I will be in touch with the labs once I have reviewed them Follow up with me in 1 year, sooner if you need me

## 2023-12-12 NOTE — Assessment & Plan Note (Signed)
Pending CBC today most recent iron B12 were all normal

## 2023-12-12 NOTE — Assessment & Plan Note (Signed)
Patient is currently followed by cardiology currently maintained on isosorbide, losartan, metoprolol, torsemide.  Continue taking medications prescribed blood pressure controlled today in office

## 2023-12-12 NOTE — Assessment & Plan Note (Signed)
Patient currently maintained on CPAP therapy states she has been adherent as of late continue

## 2023-12-12 NOTE — Assessment & Plan Note (Signed)
Patient currently maintained on no medications currently she is followed by endocrinology.  Continue following with specialist as recommended

## 2023-12-13 LAB — CBC
HCT: 32.1 % — ABNORMAL LOW (ref 36.0–46.0)
Hemoglobin: 11.2 g/dL — ABNORMAL LOW (ref 12.0–15.0)
MCHC: 34.9 g/dL (ref 30.0–36.0)
MCV: 87.5 fL (ref 78.0–100.0)
Platelets: 225 10*3/uL (ref 150.0–400.0)
RBC: 3.67 Mil/uL — ABNORMAL LOW (ref 3.87–5.11)
RDW: 13.3 % (ref 11.5–15.5)
WBC: 3.7 10*3/uL — ABNORMAL LOW (ref 4.0–10.5)

## 2023-12-15 ENCOUNTER — Other Ambulatory Visit: Payer: Self-pay

## 2023-12-15 MED FILL — Cyclobenzaprine HCl Tab 10 MG: ORAL | 30 days supply | Qty: 30 | Fill #2 | Status: AC

## 2023-12-16 ENCOUNTER — Other Ambulatory Visit: Payer: Self-pay

## 2023-12-20 ENCOUNTER — Encounter: Payer: Self-pay | Admitting: Nurse Practitioner

## 2023-12-20 ENCOUNTER — Other Ambulatory Visit: Payer: Self-pay | Admitting: Internal Medicine

## 2023-12-20 ENCOUNTER — Other Ambulatory Visit: Payer: Self-pay

## 2023-12-20 MED ORDER — INSULIN LISPRO 100 UNIT/ML IJ SOLN
90.0000 [IU] | Freq: Every day | INTRAMUSCULAR | 3 refills | Status: DC
  Filled 2023-12-20: qty 60, 50d supply, fill #0
  Filled 2024-02-16: qty 100, 83d supply, fill #1
  Filled 2024-05-04 – 2024-05-08 (×2): qty 80, 66d supply, fill #2

## 2023-12-21 ENCOUNTER — Ambulatory Visit: Payer: Commercial Managed Care - PPO

## 2023-12-23 ENCOUNTER — Other Ambulatory Visit: Payer: Self-pay | Admitting: Physician Assistant

## 2023-12-23 ENCOUNTER — Ambulatory Visit
Admission: RE | Admit: 2023-12-23 | Discharge: 2023-12-23 | Disposition: A | Discharge status: Home / Self Care | Payer: Commercial Managed Care - PPO | Source: Ambulatory Visit | Attending: Family Medicine | Admitting: Family Medicine

## 2023-12-23 ENCOUNTER — Other Ambulatory Visit: Payer: Self-pay

## 2023-12-23 ENCOUNTER — Ambulatory Visit (INDEPENDENT_AMBULATORY_CARE_PROVIDER_SITE_OTHER): Payer: Commercial Managed Care - PPO

## 2023-12-23 VITALS — BP 155/95 | HR 76 | Temp 98.6°F

## 2023-12-23 DIAGNOSIS — I700 Atherosclerosis of aorta: Secondary | ICD-10-CM | POA: Diagnosis not present

## 2023-12-23 DIAGNOSIS — J209 Acute bronchitis, unspecified: Secondary | ICD-10-CM

## 2023-12-23 DIAGNOSIS — R062 Wheezing: Secondary | ICD-10-CM | POA: Diagnosis not present

## 2023-12-23 DIAGNOSIS — I517 Cardiomegaly: Secondary | ICD-10-CM | POA: Diagnosis not present

## 2023-12-23 DIAGNOSIS — R059 Cough, unspecified: Secondary | ICD-10-CM | POA: Diagnosis not present

## 2023-12-23 MED ORDER — PREDNISONE 10 MG (21) PO TBPK: ORAL_TABLET | Freq: Every day | ORAL | 0 refills | Status: DC

## 2023-12-23 MED ORDER — AZITHROMYCIN 250 MG PO TABS: ORAL_TABLET | ORAL | 0 refills | Status: DC

## 2023-12-23 MED ORDER — HYDROCOD POLI-CHLORPHE POLI ER 10-8 MG/5ML PO SUER: 5.0000 mL | Freq: Two times a day (BID) | ORAL | 0 refills | Status: DC | PRN

## 2023-12-23 MED FILL — Leflunomide Tab 20 MG: ORAL | 90 days supply | Qty: 90 | Fill #0 | Status: AC

## 2023-12-23 NOTE — ED Provider Notes (Signed)
[facility]    CSN: [id number] Arrival date & time: 12/23/23  1618      History   Chief Complaint Chief Complaint  Patient presents with   Cough    HPI [name] is a 57 y.o. female.   HPI  History obtained from the patient. Libra presents for cough for the past 2 weeks. They passed around a virus at work. She works at the hospital. They people at work have gotten better but she hasn't.  No fever, shortness of breath, chest tightness. Has nasal congestion. Had a terrible nose bleed. Has been wheezing. Has allergic asthma. She has been using her albuterol inhaler and OTC meds without relief.   Denies smoking history.        Past Medical History:  Diagnosis Date   ACUT MI ANTEROLAT WALL SUBSQT EPIS CARE    Acute maxillary sinusitis    ALLERGIC RHINITIS, SEASONAL    Anemia    Arthritis    ARTHRITIS, RHEUMATOID    shoulders and hands Enbrel>leg swelling Dr. Corliss Skains   Atrial fibrillation [facility])    a. after CABG.   Back pain    Bruxism (teeth grinding)    CAD, ARTERY BYPASS GRAFT    a. DES to RCA in 2010 then LAD occlusion s/p CABG 3 06/07/2009 with LIMA to LAD, reverse SVG to D1, reverse SVG to distal RCA. b. Cath 05/08/2016 slightly hypodense region in the intermediate branch, however she had excellent flow, FFR was normal. Vein graft to PDA and the posterolateral branch is patent, patent LIMA to LAD, occluded SVG to diagonal.   CARPAL TUNNEL SYNDROME, BILATERAL    CHOLELITHIASIS    Contrast media allergy    DERMATITIS, ALLERGIC    DIABETES MELLITUS, TYPE I    on insulin pump dx'ed age 8 y.o    DIABETIC  RETINOPATHY    Graves' disease    Heart attack (HCC)    Hiatal hernia    HYPERLIPIDEMIA-MIXED    Hypertension    HYPERTHYROIDISM    Dr. Osborne Casco   IBS (irritable bowel syndrome)    IDA (iron deficiency anemia)    Infertility, female    Insulin pump in place    Lymphadenopathy of head and neck    MIGRAINE W/O AURA W/INTRACT W/STATUS  MIGRAINOSUS 02/19/2008   PONV (postoperative nausea and vomiting)    Psoriasis    Rheumatoid arthritis (HCC)    SINUS TACHYCARDIA 11/08/2010   Sleep apnea    not using machine yet    Stomach ulcer    SVT (supraventricular tachycardia) (HCC)    after s/p CABG   Tendonitis    TRIGGER FINGER    all fingers b/l hands    URI     Patient Active Problem List   Diagnosis Date Noted   Acute bronchitis 12/23/2023   Abnormal CBC 12/12/2023   H/O Graves' disease 12/11/2023   Mixed diabetic hyperlipidemia associated with type 1 diabetes mellitus (HCC) 12/11/2023   Dysfunction of both eustachian tubes 11/01/2023   Lower respiratory infection 08/30/2023   Lower extremity edema 06/12/2023   Myofascial pain dysfunction syndrome 03/22/2023   Left foot pain 12/05/2022   Urinary urgency 10/23/2022   Pyelonephritis 10/23/2022   Premature atrial contractions 05/15/2022   PVC's (premature ventricular contractions) 05/15/2022   Low hemoglobin 01/16/2022   Otalgia of right ear 01/09/2022   Sore throat 01/09/2022   OSA on CPAP 08/02/2021   Abnormal MRI, lumbar spine 03/20/2021   Abnormal MRI, thoracic  spine 03/20/2021   Chronic mid back pain 03/17/2021   Chronic midline low back pain with left-sided sciatica 03/17/2021   Type 1 diabetes mellitus with diabetic polyneuropathy (HCC) 02/17/2021   Severe obstructive sleep apnea-hypopnea syndrome 02/17/2021   Chronic diastolic CHF (congestive heart failure) (HCC) 12/19/2020   Chronic back pain 12/19/2020   Chronic abdominal pain 12/19/2020   Acute diastolic heart failure (HCC) 12/09/2020   Diabetic retinopathy associated with type 1 diabetes mellitus (HCC) 09/27/2020   Diabetic retinopathy (HCC) 09/27/2020   Graves' disease 09/27/2020   Chronic coronary artery disease 09/27/2020   Hypertension associated with diabetes (HCC) 09/14/2020   Class 1 obesity 09/14/2020   Preventative health care 09/14/2020   Aortic atherosclerosis (HCC) 09/14/2020    Snoring 05/12/2020   Lung nodule 03/10/2020   Colitis 03/10/2020   Type 1 diabetes mellitus with proliferative retinopathy (HCC) 12/10/2019   Abnormal thyroid function test 12/10/2019   Bruxism (teeth grinding)    Migraine 10/07/2019   Contusion of left hip 07/25/2019   H/O multiple allergies 03/10/2019   Hx of anaphylaxis 03/10/2019   Cough 06/26/2018   PND (post-nasal drip) 06/26/2018   Lumbar strain, initial encounter 04/10/2018   Thoracic myofascial strain 04/10/2018   Vitamin D deficiency 07/04/2017   B12 deficiency 07/04/2017   Abnormal CT scan 06/19/2017   Gastroesophageal reflux disease 06/19/2017   Antiplatelet or antithrombotic long-term use 06/19/2017   Nasal congestion 03/15/2017   Graves disease 02/01/2017   Sleep difficulties 02/01/2017   No energy 02/01/2017   Osteopenia 02/01/2017   Bilateral hand pain 07/16/2016   Acquired trigger finger 07/16/2016   Hypertension, essential 05/08/2016   Right foot pain 08/30/2014   Shoulder pain, right 08/30/2014   Chronic pain syndrome 01/15/2014   Multiple joint pain 01/15/2014   Lesion of lower eyelid 04/21/2013   Generalized constriction of visual field 03/31/2013   Neoplasm of uncertain behavior of skin of eyelid 03/31/2013   Posterior capsular opacification 03/31/2013   Cyst of left lower eyelid 03/30/2013   Pseudophakia of both eyes 03/30/2013   Prurigo nodularis 02/05/2013   Stasis dermatitis 02/05/2013   Psoriasis 01/21/2013   Trochanteric bursitis 11/10/2012   Achilles tendinitis 07/11/2012   Epiretinal membrane 06/17/2012   Status post cataract extraction 04/30/2012   Pes equinus, acquired 04/23/2012   Tendinitis of ankle 04/23/2012   Proliferative diabetic retinopathy of both eyes (HCC) 01/11/2012   Ongoing use of possibly toxic medication 12/05/2011   Hyperthyroidism 11/09/2010   SINUS TACHYCARDIA 11/08/2010   Shortness of breath 11/08/2010   Acute non-recurrent maxillary sinusitis 10/16/2010    DERMATITIS, ALLERGIC 07/20/2010   EDEMA 07/12/2010   Dizziness 11/14/2009   ATRIAL FIBRILLATION 07/20/2009   Hx of CABG 06/07/2009   ANGINA, STABLE/EXERTIONAL 06/02/2009   Dyslipidemia, goal LDL below 70 04/26/2009   ACUT MI ANTEROLAT WALL SUBSQT EPIS CARE 04/26/2009   Chest pain 04/26/2009   DIABETIC  RETINOPATHY 04/25/2009   CARPAL TUNNEL SYNDROME, BILATERAL 04/25/2009   TRIGGER FINGER 04/25/2009   MIGRAINE W/O AURA W/INTRACT W/STATUS MIGRAINOSUS 02/19/2008   Type 1 diabetes mellitus with complications (HCC) 10/16/2006   ALLERGIC RHINITIS, SEASONAL 10/16/2006   CHOLELITHIASIS 10/16/2006   Rheumatoid arthritis (HCC) 10/16/2006    Past Surgical History:  Procedure Laterality Date   ABDOMINAL HYSTERECTOMY     endometriomas b/l; total ? cervix removed; no h/o abnormal pap   Caesarean section     CARDIAC CATHETERIZATION N/A 05/08/2016   Procedure: Left Heart Cath and Cors/Grafts Angiography;  Surgeon: Nile Dear  Clifton James, MD;  Location: [facility];  Service: Cardiovascular;  Laterality: N/A;   CARPAL TUNNEL RELEASE     b/l    CARPAL TUNNEL RELEASE Left 06/06/2021   Procedure: CARPAL TUNNEL RELEASE;  Surgeon: Cindee Salt, MD;  Location: [facility];  Service: Orthopedics;  Laterality: Left;  AXILLARY BLOCK   CATARACT EXTRACTION, BILATERAL     CHOLECYSTECTOMY     COLONOSCOPY WITH PROPOFOL N/A 03/25/2020   Procedure: COLONOSCOPY WITH PROPOFOL;  Surgeon: Wyline Mood, MD;  Location: [facility] END[facility]COPY;  Service: Gastroenterology;  Laterality: N/A;   CORONARY ARTERY BYPASS GRAFT     ESOPHAGOGASTRODUODENOSCOPY (EGD) WITH PROPOFOL N/A 03/25/2020   Procedure: ESOPHAGOGASTRODUODENOSCOPY (EGD) WITH PROPOFOL;  Surgeon: Wyline Mood, MD;  Location: [facility] ENDOSCOPY;  Service: Gastroenterology;  Laterality: N/A;   EYE SURGERY     laser x 2 for retinopathy    LEFT HEART CATH AND CORS/GRAFTS ANGIOGRAPHY N/A 02/22/2020   Procedure: LEFT HEART CATH AND CORS/GRAFTS ANGIOGRAPHY;  Surgeon:  Kathleene Hazel, MD;  Location: [facility];  Service: Cardiovascular;  Laterality: N/A;   TRIGGER FINGER RELEASE     b/l fingers all    ULNAR NERVE TRANSPOSITION Left 06/06/2021   Procedure: ULNAR NERVE DECOMPRESSION;  Surgeon: Cindee Salt, MD;  Location:  SURGERY CENTER;  Service: Orthopedics;  Laterality: Left;  AXILLARY BLOCK   VITRECTOMY     b/l     OB History   No obstetric history on file.      Home Medications    Prior to Admission medications   Medication Sig Start Date End Date Taking? Authorizing Provider  azithromycin (ZITHROMAX Z-PAK) 250 MG tablet Take 2 tablets on day 1 then 1 tablet daily 12/23/23  Yes Khylah Kendra, DO  chlorpheniramine-HYDROcodone (TUSSIONEX) 10-8 MG/5ML Take 5 mLs by mouth every 12 (twelve) hours as needed. 12/23/23  Yes Beverley Sherrard, DO  predniSONE (STERAPRED UNI-PAK 21 TAB) 10 MG (21) TBPK tablet Take by mouth daily. Take 6 tabs by mouth daily for 1, then 5 tabs for 1 day, then 4 tabs for 1 day, then 3 tabs for 1 day, then 2 tabs for 1 day, then 1 tab for 1 day. 12/23/23  Yes Fidelia Cathers, Seward Meth, DO  Ascorbic Acid 500 MG CAPS Take by mouth. 10/21/13   [provider]  aspirin EC 81 MG tablet Take 81 mg by mouth daily.    [provider]  Blood Glucose Monitoring Suppl (FREESTYLE FREEDOM LITE) w/Device KIT Use as directed by physician to check blood sugar 02/27/22     CHELATED MAGNESIUM PO Take 250 mg by mouth daily.    [provider]  Cholecalciferol (VITAMIN D3) 125 MCG (5000 UT) CAPS Decrease to 1 tab every other day 12/11/23   Thomasene Lot, DO  clopidogrel (PLAVIX) 75 MG tablet TAKE 1 TABLET BY MOUTH ONCE DAILY 07/17/23   Tonny Bollman, MD  Continuous Blood Gluc Transmit (DEXCOM G6 TRANSMITTER) MISC Dispense and use as directed.  Change transmitter every 90 days. 01/30/22     Cyanocobalamin (VITAMIN B-12 PO) Take by mouth.    [provider]  cyclobenzaprine (FLEXERIL) 10 MG tablet  Take 1 tablet (10 mg total) by mouth at bedtime as needed for muscle spasms. 10/11/23   Gearldine Bienenstock, PA-C  EPINEPHrine 0.3 mg/0.3 mL IJ SOAJ injection Inject 0.3 mg into the muscle as needed for anaphylaxis. 11/14/21   McLean-Scocuzza, Pasty Spillers, MD  fexofenadine [facility] ALLERGY) 180 MG tablet Take 1 tablet (180  mg total) by mouth daily. 11/01/23   Eden Emms, NP  fluticasone (FLONASE) 50 MCG/ACT nasal spray Place 2 sprays into both nostrils daily. 11/27/23   Eden Emms, NP  glucose blood (FREESTYLE LITE) test strip Use to check blood sugar 3 time(s) daily 09/26/23   Carlus Pavlov, MD  insulin detemir (LEVEMIR) 100 UNIT/ML injection Inject 36 units into the skin once daily in the event of insulin pump failure. 07/09/22     insulin lispro (HUMALOG) 100 UNIT/ML injection Inject 0.9-1.2 mLs (90-120 Units total) into the skin daily. 12/20/23   Carlus Pavlov, MD  Insulin Syringe-Needle U-100 (BD INSULIN SYRINGE U/F) 31G X 5/16" 0.3 ML MISC use with insulin injections 3 times daily in the event of insulin pump failure. 11/22/21     ipratropium (ATROVENT) 0.06 % nasal spray Place 2 sprays into both nostrils 4 (four) times daily. 08/24/23   Becky Augusta, NP  isosorbide mononitrate (IMDUR) 60 MG 24 hr tablet Take 1 tablet (60 mg total) by mouth daily. 03/11/23   Tonny Bollman, MD  Boris Lown Oil 500 MG CAPS Take 500 mg by mouth daily.    [provider]  Lancets (FREESTYLE) lancets Use to check blood sugar 3 time(s) daily. 02/27/22     leflunomide (ARAVA) 20 MG tablet Take 1 tablet (20 mg total) by mouth daily. 12/23/23   Pollyann Savoy, MD  linaclotide Karlene Einstein) 290 MCG CAPS capsule Take 1 capsule (290 mcg total) by mouth daily before breakfast. 08/19/23   Wyline Mood, MD  losartan (COZAAR) 100 MG tablet Take 1 tablet (100 mg total) by mouth daily. 04/17/23   Swinyer, Zachary George, NP  metoprolol tartrate (LOPRESSOR) 50 MG tablet Take 0.5 tablets (25 mg total) by mouth 2 (two) times daily.  03/11/23 03/11/24  Swinyer, Zachary George, NP  montelukast (SINGULAIR) 10 MG tablet Take 1 tablet (10 mg total) by mouth at bedtime. 11/01/23   Eden Emms, NP  Multiple Vitamin (MULTIVITAMIN) capsule Take 1 capsule by mouth daily.    [provider]  naltrexone (DEPADE) 50 MG tablet Take 0.5 tablets (25 mg total) by mouth daily. Called to [facility]- 3 mg x 1 week then 6 mg nightly- for 6 months 10/28/23   Lovorn, Aundra Millet, MD  pantoprazole (PROTONIX) 40 MG tablet Take 1 tablet (40 mg total) by mouth 2 (two) times daily. 10/15/22 01/12/24  Wyline Mood, MD  potassium chloride SA (KLOR-CON M20) 20 MEQ tablet Take 1 tablet (20 mEq total) by mouth daily. 08/21/23   Tonny Bollman, MD  rosuvastatin (CRESTOR) 20 MG tablet Take 1 tablet (20 mg total) by mouth daily. 06/10/23   Tonny Bollman, MD  torsemide (DEMADEX) 20 MG tablet Take 1 tablet (20 mg total) by mouth daily. 03/11/23   Tonny Bollman, MD  Turmeric (QC TUMERIC COMPLEX PO) Take 1,000 mg by mouth.    [provider]  Upadacitinib ER (RINVOQ) 15 MG TB24 Take 1 tablet (15 mg) by mouth daily. 11/21/23   Gearldine Bienenstock, PA-C    Family History Family History  Problem Relation Age of Onset   Depression Mother    Anxiety disorder Mother    Hypertension Mother    Hyperlipidemia Mother    Thyroid disease Mother    Bipolar disorder Mother    Sleep apnea Mother    Eating disorder Mother    Colon polyps Father    Diabetes Father        2   Hypertension Father  Parkinson's disease Father    Hyperlipidemia Father    Depression Father    Sleep apnea Father    Obesity Father    Stroke Paternal Grandmother    Heart disease Other    Hypertension Other    Hyperlipidemia Other    Depression Other    Migraines Other    Heart attack Neg Hx    Colon cancer Neg Hx    Stomach cancer Neg Hx    Esophageal cancer Neg Hx     Social History Social History   Tobacco Use   Smoking status: Never    Passive exposure: Never    Smokeless tobacco: Never  Vaping Use   Vaping status: Never Used  Substance Use Topics   Alcohol use: Yes    Comment: rarely 1 every 6 months   Drug use: No     Allergies   Actemra [tocilizumab], Ramipril, Shellfish-derived products, Atorvastatin, Compazine  [prochlorperazine edisylate], Emgality [galcanezumab-gnlm], Etanercept, Infliximab, Iohexol, Orencia [abatacept], Prochlorperazine edisylate, Tofacitinib, Trokendi xr [topiramate er], Tramadol, Amiodarone, and Rituximab   Review of Systems Review of Systems: negative unless otherwise stated in HPI.      Physical Exam Triage Vital Signs ED Triage Vitals [12/23/23 1643]  Encounter Vitals Group     BP (!) 155/95     Systolic BP Percentile      Diastolic BP Percentile      Pulse Rate 76     Resp      Temp 98.6 F (37 C)     Temp Source Oral     SpO2 94 %     Weight      Height      Head Circumference      Peak Flow      Pain Score 0     Pain Loc      Pain Education      Exclude from Growth Chart    No data found.  Updated Vital Signs BP (!) 155/95 (BP Location: Left Arm)   Pulse 76   Temp 98.6 F (37 C) (Oral)   SpO2 94%   Visual Acuity Right Eye Distance:   Left Eye Distance:   Bilateral Distance:    Right Eye Near:   Left Eye Near:    Bilateral Near:     Physical Exam GEN:     alert, non-toxic appearing female in no distress    HENT:  mucus membranes moist,no  nasal discharge EYES:   no scleral injection or discharge RESP:  no increased work of breathing, coarse breath sounds bilaterally, expiratory wheezing with frequent cough CVS:   regular rate and rhythm Skin:   warm and dry    UC Treatments / Results  Labs (all labs ordered are listed, but only abnormal results are displayed) Labs Reviewed - No data to display  EKG   Radiology DG Chest 2 View Result Date: 12/23/2023 CLINICAL DATA:  Cough for 2 weeks with wheezing EXAM: CHEST - 2 VIEW COMPARISON:  11/01/2023 FINDINGS: Median  sternotomy for CABG. Midline trachea. Borderline cardiomegaly. Atherosclerosis in the transverse aorta. No pleural effusion or pneumothorax. No congestive failure. Clear lungs. IMPRESSION: 1. No acute cardiopulmonary disease. 2. Borderline cardiomegaly. 3.  Aortic Atherosclerosis (ICD10-I70.0). Electronically Signed   By: Jeronimo Greaves M.D.   On: 12/23/2023 19:00    Procedures Procedures (including critical care time)  Medications Ordered in UC Medications - No data to display  Initial Impression / Assessment and Plan / UC Course  I have  reviewed the triage vital signs and the nursing notes.  Pertinent labs & imaging results that were available during my care of the patient were reviewed by me and considered in my medical decision making (see chart for details).      Pt is a 57 y.o. female who has history of allergic asthma presents for 2 weeks of cough that is not improving.  [name] is  afebrile here without recent antipyretics. Satting 94% on room air. Overall pt is  non-toxic appearing, well hydrated, without respiratory distress. Pulmonary exam is remarkable for coarse breath sounds bilaterally, expiratory wheezing with frequent cough.  After shared decision making, we will pursue chest x-ray.  COVID  and influenza testing deferred due to length of symptoms.   Chest xray personally reviewed by me without focal pneumonia, pleural effusion, cardiomegaly or pneumothorax. Patient aware the radiologist has not read her xray and is comfortable with the preliminary read by me. Will review radiologist read when available and call patient if a change in plan is warranted.  Pt agreeable to this plan prior to discharge.    Treat acute bronchitis with steroids and antibiotics as below.  Tussionex cough syrup given for cough and allow patient to rest.  Typical duration of symptoms discussed. Return and ED precautions given and patient voiced understanding.   Discussed MDM, treatment plan and plan for  follow-up with patient who agrees with plan.   Radiologist impression reviewed. Notes borderline cardiomegaly.   Final Clinical Impressions(s) / UC Diagnoses   Final diagnoses:  Acute bronchitis, unspecified organism     Discharge Instructions      Your chest xray did not show evidence of pneumonia though the radiologist has not yet read it. If they find something that I didn't, I will call you.    Stop by the pharmacy to pick up your prescriptions.  Follow up with your primary care provider or return to urgent care if not improving.        ED Prescriptions     Medication Sig Dispense Auth. Provider   chlorpheniramine-HYDROcodone (TUSSIONEX) 10-8 MG/5ML Take 5 mLs by mouth every 12 (twelve) hours as needed. 115 mL Megham Dwyer, DO   predniSONE (STERAPRED UNI-PAK 21 TAB) 10 MG (21) TBPK tablet Take by mouth daily. Take 6 tabs by mouth daily for 1, then 5 tabs for 1 day, then 4 tabs for 1 day, then 3 tabs for 1 day, then 2 tabs for 1 day, then 1 tab for 1 day. 21 tablet Jaylinn Hellenbrand, DO   azithromycin (ZITHROMAX Z-PAK) 250 MG tablet Take 2 tablets on day 1 then 1 tablet daily 6 tablet Guila Owensby, DO      I have reviewed the PDMP during this encounter.   Katha Cabal, DO 12/23/23 1909

## 2023-12-23 NOTE — Telephone Encounter (Signed)
Last Fill: 09/09/2023  Labs: 12/12/2023 WBC 3.7, RBC 3.67, Hgb  11.2, Hct 32.1 11/26/2023 CMP WNL  Next Visit: 05/11/2024  Last Visit: 12/10/2023  DX: Rheumatoid arthritis of multiple sites with negative rheumatoid factor   Current Dose per office note 12/10/2023: Arava 20 mg 1 tablet by mouth daily   Okay to refill Arava ?

## 2023-12-23 NOTE — Discharge Instructions (Addendum)
Your chest xray did not show evidence of pneumonia though the radiologist has not yet read it. If they find something that I didn't, I will call you.    Stop by the pharmacy to pick up your prescriptions.  Follow up with your primary care provider or return to urgent care if not improving.

## 2023-12-23 NOTE — ED Triage Notes (Signed)
Pt presents to UC c/o cough x2 weeks, pt has been doing tylenol cold, robitussin, and delsym with little to no relief.

## 2023-12-24 ENCOUNTER — Other Ambulatory Visit: Payer: Self-pay

## 2023-12-24 ENCOUNTER — Other Ambulatory Visit (HOSPITAL_COMMUNITY): Payer: Self-pay | Admitting: Pharmacy Technician

## 2023-12-24 ENCOUNTER — Other Ambulatory Visit (HOSPITAL_COMMUNITY): Payer: Self-pay

## 2023-12-24 NOTE — Progress Notes (Signed)
Specialty Pharmacy Refill Coordination Note  [name] is a 57 y.o. female contacted today regarding refills of specialty medication(s) Upadacitinib (Rinvoq)   Patient requested Delivery   Delivery date: 12/31/23   Verified address: [address] [name]   Medication will be filled on 12/30/23.

## 2023-12-27 ENCOUNTER — Other Ambulatory Visit: Payer: Self-pay

## 2023-12-30 ENCOUNTER — Other Ambulatory Visit: Payer: Self-pay

## 2024-01-02 ENCOUNTER — Ambulatory Visit (INDEPENDENT_AMBULATORY_CARE_PROVIDER_SITE_OTHER): Payer: Commercial Managed Care - PPO | Admitting: Adult Health

## 2024-01-07 ENCOUNTER — Ambulatory Visit
Admission: RE | Admit: 2024-01-07 | Discharge: 2024-01-07 | Disposition: A | Discharge status: Home / Self Care | Payer: Commercial Managed Care - PPO | Source: Ambulatory Visit | Attending: Rheumatology | Admitting: Rheumatology

## 2024-01-07 ENCOUNTER — Ambulatory Visit
Admission: RE | Admit: 2024-01-07 | Discharge: 2024-01-07 | Disposition: A | Discharge status: Home / Self Care | Payer: Commercial Managed Care - PPO | Attending: Rheumatology | Admitting: Rheumatology

## 2024-01-07 DIAGNOSIS — M542 Cervicalgia: Secondary | ICD-10-CM | POA: Diagnosis not present

## 2024-01-07 DIAGNOSIS — M8580 Other specified disorders of bone density and structure, unspecified site: Secondary | ICD-10-CM | POA: Diagnosis not present

## 2024-01-08 ENCOUNTER — Other Ambulatory Visit: Payer: Self-pay

## 2024-01-08 ENCOUNTER — Other Ambulatory Visit: Payer: Self-pay | Admitting: Gastroenterology

## 2024-01-08 MED ORDER — PANTOPRAZOLE SODIUM 40 MG PO TBEC
40.0000 mg | DELAYED_RELEASE_TABLET | Freq: Two times a day (BID) | ORAL | 3 refills | Status: DC
  Filled 2024-01-08: qty 180, 90d supply, fill #0
  Filled 2024-01-15 – 2024-04-08 (×2): qty 180, 90d supply, fill #1
  Filled 2024-07-01: qty 180, 90d supply, fill #2
  Filled 2024-07-01: qty 180, 90d supply, fill #0
  Filled 2024-10-01: qty 180, 90d supply, fill #1

## 2024-01-13 ENCOUNTER — Encounter (INDEPENDENT_AMBULATORY_CARE_PROVIDER_SITE_OTHER): Payer: Commercial Managed Care - PPO | Admitting: Ophthalmology

## 2024-01-13 DIAGNOSIS — H35033 Hypertensive retinopathy, bilateral: Secondary | ICD-10-CM | POA: Diagnosis not present

## 2024-01-13 DIAGNOSIS — I10 Essential (primary) hypertension: Secondary | ICD-10-CM | POA: Diagnosis not present

## 2024-01-13 DIAGNOSIS — Z794 Long term (current) use of insulin: Secondary | ICD-10-CM

## 2024-01-13 DIAGNOSIS — E103593 Type 1 diabetes mellitus with proliferative diabetic retinopathy without macular edema, bilateral: Secondary | ICD-10-CM

## 2024-01-14 NOTE — Progress Notes (Signed)
 X-rays of cervical spine were unremarkable per radiology report.

## 2024-01-15 ENCOUNTER — Other Ambulatory Visit: Payer: Self-pay | Admitting: Rheumatology

## 2024-01-15 ENCOUNTER — Other Ambulatory Visit: Payer: Self-pay | Admitting: Cardiovascular Disease

## 2024-01-16 ENCOUNTER — Other Ambulatory Visit: Payer: Self-pay | Admitting: Cardiovascular Disease

## 2024-01-16 ENCOUNTER — Other Ambulatory Visit: Payer: Self-pay

## 2024-01-16 ENCOUNTER — Other Ambulatory Visit: Payer: Self-pay | Admitting: Rheumatology

## 2024-01-16 ENCOUNTER — Other Ambulatory Visit (HOSPITAL_COMMUNITY): Payer: Self-pay

## 2024-01-16 MED FILL — Cyclobenzaprine HCl Tab 10 MG: ORAL | 30 days supply | Qty: 30 | Fill #0 | Status: AC

## 2024-01-16 MED FILL — Clopidogrel Bisulfate Tab 75 MG (Base Equiv): ORAL | 90 days supply | Qty: 90 | Fill #0 | Status: CN

## 2024-01-16 NOTE — Telephone Encounter (Signed)
Last Fill: 10/11/2023   Next Visit: 05/11/2024   Last Visit: 12/10/2023   Dx: Spondylosis of lumbar spine    Current Dose per office note on 12/10/2023: flexeril 10 mg 1 tablet by mouth at bedtime for muscle spasms and insomnia.    Okay to refill Flexeril?

## 2024-01-17 ENCOUNTER — Other Ambulatory Visit: Payer: Self-pay

## 2024-01-17 DIAGNOSIS — E10319 Type 1 diabetes mellitus with unspecified diabetic retinopathy without macular edema: Secondary | ICD-10-CM | POA: Diagnosis not present

## 2024-01-20 ENCOUNTER — Other Ambulatory Visit: Payer: Self-pay | Admitting: Nurse Practitioner

## 2024-01-20 DIAGNOSIS — H6993 Unspecified Eustachian tube disorder, bilateral: Secondary | ICD-10-CM

## 2024-01-20 DIAGNOSIS — R052 Subacute cough: Secondary | ICD-10-CM

## 2024-01-21 ENCOUNTER — Other Ambulatory Visit: Payer: Self-pay | Admitting: Nurse Practitioner

## 2024-01-21 ENCOUNTER — Other Ambulatory Visit: Payer: Self-pay

## 2024-01-21 ENCOUNTER — Other Ambulatory Visit (HOSPITAL_COMMUNITY): Payer: Self-pay

## 2024-01-21 ENCOUNTER — Encounter (HOSPITAL_COMMUNITY): Payer: Self-pay

## 2024-01-21 DIAGNOSIS — R052 Subacute cough: Secondary | ICD-10-CM

## 2024-01-21 DIAGNOSIS — H6993 Unspecified Eustachian tube disorder, bilateral: Secondary | ICD-10-CM

## 2024-01-21 MED FILL — Fexofenadine HCl Tab 180 MG: ORAL | 90 days supply | Qty: 90 | Fill #0 | Status: AC

## 2024-01-22 ENCOUNTER — Encounter (INDEPENDENT_AMBULATORY_CARE_PROVIDER_SITE_OTHER): Payer: Self-pay | Admitting: Family Medicine

## 2024-01-22 ENCOUNTER — Ambulatory Visit (INDEPENDENT_AMBULATORY_CARE_PROVIDER_SITE_OTHER): Payer: Commercial Managed Care - PPO | Admitting: Family Medicine

## 2024-01-22 ENCOUNTER — Other Ambulatory Visit: Payer: Self-pay

## 2024-01-22 VITALS — BP 135/78 | HR 81 | Temp 98.4°F | Ht 61.25 in | Wt 174.0 lb

## 2024-01-22 DIAGNOSIS — Z794 Long term (current) use of insulin: Secondary | ICD-10-CM

## 2024-01-22 DIAGNOSIS — E108 Type 1 diabetes mellitus with unspecified complications: Secondary | ICD-10-CM | POA: Diagnosis not present

## 2024-01-22 DIAGNOSIS — E1059 Type 1 diabetes mellitus with other circulatory complications: Secondary | ICD-10-CM | POA: Diagnosis not present

## 2024-01-22 DIAGNOSIS — E559 Vitamin D deficiency, unspecified: Secondary | ICD-10-CM | POA: Diagnosis not present

## 2024-01-22 DIAGNOSIS — E669 Obesity, unspecified: Secondary | ICD-10-CM | POA: Diagnosis not present

## 2024-01-22 DIAGNOSIS — I152 Hypertension secondary to endocrine disorders: Secondary | ICD-10-CM

## 2024-01-22 DIAGNOSIS — E1159 Type 2 diabetes mellitus with other circulatory complications: Secondary | ICD-10-CM

## 2024-01-22 DIAGNOSIS — Z6832 Body mass index (BMI) 32.0-32.9, adult: Secondary | ICD-10-CM | POA: Diagnosis not present

## 2024-01-22 DIAGNOSIS — E1069 Type 1 diabetes mellitus with other specified complication: Secondary | ICD-10-CM

## 2024-01-22 NOTE — Progress Notes (Signed)
[name], D.O.  ABFM, ABOM Specializing in Clinical Bariatric Medicine  Office located at: [address] [place], [place]  [address]   Assessment and Plan:   FOR THE DISEASE OF OBESITY: Obesity (BMI 30-39.9), Starting BMI 11/26/23 32.02 BMI 32.0-32.9,adult -- Current BMI 32.6 Assessment & Plan: Since last office visit on 12/11/23 patient's muscle mass has decreased by 0.4lb. Fat mass has decreased by 1.8lb. Total body water has decreased by 1.8lb.  Counseling done on how various foods will affect these numbers and how to maximize success  Total lbs lost to date: 4 lbs  Total weight loss percentage to date: -2.25%    Recommended Dietary Goals [name] is currently in the action stage of change. As such, her goal is to continue weight management plan. Recipes that she can find on Chat GPT to help her find alternative ways to increase her protein intake and still be vegetarian foods   She has agreed to: continue current plan   Behavioral Intervention We discussed the following today: increasing lean protein intake to established goals, avoiding skipping meals, increasing water intake , work on tracking and journaling calories using tracking application, keeping healthy foods at home, work on managing stress, creating time for self-care and relaxation, avoiding temptations and identifying enticing environmental cues, continue to work on implementation of reduced calorie nutritional plan, continue to work on maintaining a reduced calorie state, getting the recommended amount of protein, incorporating whole foods, making healthy choices, staying well hydrated and practicing mindfulness when eating., and using GPT or another AI platform for recipe ideas- searching "low calorie, low carb, high protein chicken recipes" etc  Additional resources provided today: None  Evidence-based interventions for health behavior change were utilized today including the discussion of self  monitoring techniques, problem-solving barriers and SMART goal setting techniques.   Regarding patient's less desirable eating habits and patterns, we employed the technique of small changes.   Pt will specifically work on: Walking 20 minutes for 3 days a week  for next visit.    Recommended Physical Activity Goals [name] has been advised to work up to 150 minutes of moderate intensity aerobic activity a week and strengthening exercises 2-3 times per week for cardiovascular health, weight loss maintenance and preservation of muscle mass.   She has agreed to :  Think about enjoyable ways to increase daily physical activity and overcoming barriers to exercise, Increase physical activity in their day and reduce sedentary time (increase NEAT)., and Increase the intensity, frequency or duration of aerobic exercises     Pharmacotherapy We discussed various medication options to help [name] with her weight loss efforts and we both agreed to : continue with nutritional and behavioral strategies  Pt cravings are uncontrolled. She reports terrible side effects on Ozempic (acid reflux), even when eating on plan. Advised pt to discuss with her endocrinology team if GLP-1 medications would be possible for weight loss.   FOR ASSOCIATED CONDITIONS ADDRESSED TODAY: Type 1 diabetes mellitus with complications [facility]) Assessment & Plan: Lab Results  Component Value Date   HGBA1C 6.3 (A) 09/26/2023   HGBA1C 7.0 (H) 05/15/2022   HGBA1C 6.8 (H) 01/11/2022   INSULIN <0.4 (L) 11/26/2023    Pt is on an insulin pump, condition managed by Dr. Wyonia [name]. As recommended at her last OV, she has been keeping a glucose packet with her at all times. Has not been having as many low glucose readings. P is currently drinking 3 bottles of diet coke (16  oz) daily.   Encouraged pt to keep a protein bar in her pocket and eat as she is able around her work schedule. Decrease from 3 bottles of diet coke (16 oz) to 2 bottles a day,  with the plan to eventually decrease to 1 bottle. Drink sufficient amounts of water daily to stay properly hydrated. Encourages pt to follow her meal plan ans reviewed resources to find multiple recipes. Continue with current regimen unless otherwise instructed.    Vitamin D deficiency Assessment & Plan: Lab Results  Component Value Date   VD25OH 92.9 11/26/2023   VD25OH 74.19 12/05/2022   VD25OH 80.30 05/15/2022   Last vitamin D was 92.9. Pt is on Cholecalciferol 5000 units every other day, was recently decreased to this dose to avoid over-supplementation. Tolerating this dose well, no SE. No acute concerns today. Continue on current regimen. No changes were made today. Will continue to monitor her condition alongside PCP.    Hypertension associated with diabetes [facility]) Assessment & Plan: BP Readings from Last 3 Encounters:  01/22/24 135/78  12/23/23 (!) 155/95  12/12/23 134/86   BP is at goal. PT is on Losartan 100 mg once daily, Imdur 60 mg once daily, Demadex 20 mg once daily, and Lopressor 25 mg BID. Tolerating all meds well with no SE reported. Pt notes she was on prednisone over the holidays.   Continue with current antihypertensive treatments per PCP/specialists. Encouraged her to increase exercise and reviewed benefits. Will continue to monitor her condition as it relates to her weight loss journey.    Follow up:   Return in about 4 weeks (around 02/19/2024). She was informed of the importance of frequent follow up visits to maximize her success with intensive lifestyle modifications for her multiple health conditions.  Subjective:   Chief complaint: Obesity [name] is here to discuss her progress with her obesity treatment plan. She is on the the Vegetarian Plan and states she is following her eating plan approximately 98% of the time. She states she is not exercising. She reports 10K steps at work 5 days per week.   Interval History:  [name] is here for a follow up  office visit. Since last OV, she is down 2 lb. She reports needed a sweet snack after work (around 3 pm). Not getting as much protein as she knows she should be eating. She is interested in snacks to help her meet her protein goals (drinks or bars). Has been tracking her daily calorie/protein intake on an app and reports eating about 60-65 g protein and 1150-1200 calories on average. Whenever she has dinner, she reports it is difficult for her to eat her protein.   Barriers identified: medical comorbidities.   Pharmacotherapy for weight loss: She is currently taking no anti-obesity medication.   Review of Systems:  Pertinent positives were addressed with patient today.  Reviewed by clinician on day of visit: allergies, medications, problem list, medical history, surgical history, family history, social history, and previous encounter notes.  Weight Summary and Biometrics   Weight Lost Since Last Visit: 2 lb  Weight Gained Since Last Visit: 0    Vitals Temp: 98.4 F (36.9 C) BP: 135/78 Pulse Rate: 81 SpO2: 99 %   Anthropometric Measurements Height: 5' 1.25" (1.556 m) Weight: 174 lb (78.9 kg) BMI (Calculated): 32.6 Weight at Last Visit: 174 lb Weight Lost Since Last Visit: 2 lb Weight Gained Since Last Visit: 0 Starting Weight: 178 lb Total Weight Loss (lbs): 6 lb (2.722 kg)  Peak Weight: 190 lb Waist Measurement : 45 inches   Body Composition  Body Fat %: 44.7 % Fat Mass (lbs): 77 lbs Muscle Mass (lbs): 90.4 lbs Total Body Water (lbs): 72.2 lbs Visceral Fat Rating : 11   Other Clinical Data RMR: [id number] Fasting: no Labs: no Today's Visit #: 3 Starting Date: 11/26/23    Objective:   PHYSICAL EXAM: Blood pressure 135/78, pulse 81, temperature 98.4 F (36.9 C), height 5' 1.25" (1.556 m), weight 174 lb (78.9 kg), SpO2 99%. Body mass index is 32.61 kg/m.  General: she is overweight, cooperative and in no acute distress. PSYCH: Has normal mood, affect and thought  process.   HEENT: EOMI, sclerae are anicteric. Lungs: Normal breathing effort, no conversational dyspnea. Extremities: Moves * 4 Neurologic: A and O * 3, good insight  DIAGNOSTIC DATA REVIEWED: BMET    Component Value Date/Time   NA 140 11/26/2023 1123   K 3.9 11/26/2023 1123   CL 103 11/26/2023 1123   CO2 24 11/26/2023 1123   GLUCOSE 91 11/26/2023 1123   GLUCOSE 159 (H) 09/12/2023 1548   BUN 10 11/26/2023 1123   CREATININE 0.75 11/26/2023 1123   CREATININE 0.80 09/12/2023 1548   CALCIUM 9.1 11/26/2023 1123   GFRNONAA >60 06/01/2021 0757   GFRNONAA 74 04/07/2021 0855   GFRAA 86 04/07/2021 0855   Lab Results  Component Value Date   HGBA1C 6.3 (A) 09/26/2023   HGBA1C 6.8 06/26/2007   Lab Results  Component Value Date   INSULIN <0.4 (L) 11/26/2023   Lab Results  Component Value Date   TSH 2.050 11/26/2023   CBC    Component Value Date/Time   WBC 3.7 (L) 12/12/2023 1610   RBC 3.67 (L) 12/12/2023 1610   HGB 11.2 (L) 12/12/2023 1610   HGB 11.1 (L) 09/18/2023 1447   HGB 13.4 02/15/2020 1656   HCT 32.1 (L) 12/12/2023 1610   HCT 38.9 02/15/2020 1656   PLT 225.0 12/12/2023 1610   PLT 188 09/18/2023 1447   PLT 172 02/15/2020 1656   MCV 87.5 12/12/2023 1610   MCV 87 02/15/2020 1656   MCH 29.9 09/18/2023 1447   MCHC 34.9 12/12/2023 1610   RDW 13.3 12/12/2023 1610   RDW 12.8 02/15/2020 1656   Iron Studies    Component Value Date/Time   IRON 83 09/18/2023 1447   TIBC 406 09/18/2023 1447   FERRITIN 58 09/18/2023 1447   IRONPCTSAT 20 09/18/2023 1447   Lipid Panel     Component Value Date/Time   CHOL 162 11/26/2023 1123   TRIG 96 11/26/2023 1123   HDL 76 11/26/2023 1123   CHOLHDL 2 12/05/2022 1558   VLDL 19.0 12/05/2022 1558   LDLCALC 69 11/26/2023 1123   Hepatic Function Panel     Component Value Date/Time   PROT 6.1 11/26/2023 1123   ALBUMIN 4.4 11/26/2023 1123   AST 24 11/26/2023 1123   ALT 15 11/26/2023 1123   ALKPHOS 76 11/26/2023 1123   BILITOT  0.3 11/26/2023 1123   BILIDIR 0.1 11/08/2010 1257   IBILI 0.4 07/05/2009 0743      Component Value Date/Time   TSH 2.050 11/26/2023 1123   Nutritional Lab Results  Component Value Date   VD25OH 92.9 11/26/2023   VD25OH 74.19 12/05/2022   VD25OH 80.30 05/15/2022    Attestations:   I, Isabelle Course, acting as a Stage manager for Thomasene Lot, DO., have compiled all relevant documentation for today's office visit on behalf of Thomasene Lot, DO, while  in the presence of Thomasene Lot, DO.  Reviewed by clinician on day of visit: allergies, medications, problem list, medical history, surgical history, family history, social history, and previous encounter notes pertinent to patient's obesity diagnosis.  I have reviewed the above documentation for accuracy and completeness, and I agree with the above. [name], D.O.  The 21st Century Cures Act was signed into law in 2016 which includes the topic of electronic health records.  This provides immediate access to information in MyChart.  This includes consultation notes, operative notes, office notes, lab results and pathology reports.  If you have any questions about what you read please let us know at your next visit so we can discuss your concerns and take corrective action if need be.  We are right here with you.

## 2024-01-23 ENCOUNTER — Ambulatory Visit (INDEPENDENT_AMBULATORY_CARE_PROVIDER_SITE_OTHER): Payer: Commercial Managed Care - PPO | Admitting: Family Medicine

## 2024-01-23 DIAGNOSIS — H53483 Generalized contraction of visual field, bilateral: Secondary | ICD-10-CM | POA: Diagnosis not present

## 2024-01-23 DIAGNOSIS — H02422 Myogenic ptosis of left eyelid: Secondary | ICD-10-CM | POA: Diagnosis not present

## 2024-01-23 DIAGNOSIS — H57813 Brow ptosis, bilateral: Secondary | ICD-10-CM | POA: Diagnosis not present

## 2024-01-23 DIAGNOSIS — Z01818 Encounter for other preprocedural examination: Secondary | ICD-10-CM | POA: Diagnosis not present

## 2024-01-23 DIAGNOSIS — H02834 Dermatochalasis of left upper eyelid: Secondary | ICD-10-CM | POA: Diagnosis not present

## 2024-01-23 DIAGNOSIS — H0279 Other degenerative disorders of eyelid and periocular area: Secondary | ICD-10-CM | POA: Diagnosis not present

## 2024-01-23 DIAGNOSIS — H02421 Myogenic ptosis of right eyelid: Secondary | ICD-10-CM | POA: Diagnosis not present

## 2024-01-23 DIAGNOSIS — H5702 Anisocoria: Secondary | ICD-10-CM | POA: Diagnosis not present

## 2024-01-23 DIAGNOSIS — H02423 Myogenic ptosis of bilateral eyelids: Secondary | ICD-10-CM | POA: Diagnosis not present

## 2024-01-23 DIAGNOSIS — H02831 Dermatochalasis of right upper eyelid: Secondary | ICD-10-CM | POA: Diagnosis not present

## 2024-01-24 ENCOUNTER — Other Ambulatory Visit: Payer: Self-pay

## 2024-01-24 NOTE — Progress Notes (Signed)
Specialty Pharmacy Refill Coordination Note  [name] is a 58 y.o. female contacted today regarding refills of specialty medication(s) Upadacitinib (Rinvoq)   Patient requested Delivery   Delivery date: 01/31/24   Verified address: [address] [name]   Medication will be filled on 01/30/24.

## 2024-01-24 NOTE — Progress Notes (Signed)
Specialty Pharmacy Ongoing Clinical Assessment Note  [name] is a 58 y.o. female who is being followed by the specialty pharmacy service for RxSp Rheumatoid Arthritis   Patient's specialty medication(s) reviewed today: Upadacitinib (Rinvoq)   Missed doses in the last 4 weeks: 0   Patient/Caregiver did not have any additional questions or concerns.   Therapeutic benefit summary: Patient is achieving benefit   Adverse events/side effects summary: No adverse events/side effects   Patient's therapy is appropriate to: Continue    Goals Addressed             This Visit's Progress    Minimize recurrence of flares       Patient is on track. Patient will maintain adherence         Follow up:  6 months  Otto Herb Specialty Pharmacist

## 2024-01-27 ENCOUNTER — Ambulatory Visit: Payer: Commercial Managed Care - PPO | Admitting: Internal Medicine

## 2024-01-27 ENCOUNTER — Ambulatory Visit: Payer: Commercial Managed Care - PPO | Admitting: Physical Medicine and Rehabilitation

## 2024-01-27 ENCOUNTER — Encounter: Payer: Self-pay | Admitting: Internal Medicine

## 2024-01-27 VITALS — BP 130/60 | HR 91 | Ht 61.25 in | Wt 176.4 lb

## 2024-01-27 DIAGNOSIS — E0500 Thyrotoxicosis with diffuse goiter without thyrotoxic crisis or storm: Secondary | ICD-10-CM

## 2024-01-27 DIAGNOSIS — E7849 Other hyperlipidemia: Secondary | ICD-10-CM

## 2024-01-27 DIAGNOSIS — E1059 Type 1 diabetes mellitus with other circulatory complications: Secondary | ICD-10-CM | POA: Diagnosis not present

## 2024-01-27 DIAGNOSIS — E66811 Obesity, class 1: Secondary | ICD-10-CM | POA: Diagnosis not present

## 2024-01-27 LAB — POCT GLYCOSYLATED HEMOGLOBIN (HGB A1C): Hemoglobin A1C: 6 % — AB (ref 4.0–5.6)

## 2024-01-27 NOTE — Patient Instructions (Addendum)
Please use the following pump settings: For days on: Basal rates for days on: 12 AM: 2.00 >> 1.90 6 AM: 1.80 11 AM: 2.00  Insulin to carb ratios: 12 AM: 8 (may need to change to 9)  Insulin correction (sensitivity) factor: 12 AM: 20 6 AM: 20  Blood sugar target: 12 AM: 110   For days off: Basal rates for days off: 12 AM: 2.20 >> 2.10 6 AM: 2.70  8 PM: 2.20  Insulin to carb ratios: 12 AM: 7.5 (may need to change to 8.5)  Insulin correction (sensitivity) factor: 12 AM: 20  Blood sugar target: 12 AM: 110  Active insulin time: 3 hrs  Please return in 3 to 4 months.

## 2024-01-27 NOTE — Progress Notes (Signed)
Patient ID: [name], female   DOB: 18-Sep-1966, 58 y.o.   MRN: [id number]  HPI: [name] is a 58 y.o.-year-old female, initially referred by her previous endocrinologist, Dr. Morrison Old, returning for follow-up for DM1, diagnosed at age 58, fairly well controlled, with complications (CAD, h/o AMI, DR) also h/o Graves disease.  Last visit 4 months ago.  Interim history: No increased urination, nausea, chest pain. She has blurry vision >> sees Dr. Steele Sizer >> now tested for myasthenia gravis. She goes to weight management >> lost 5 lbs net since last OV.  DM1:  Reviewed HbA1c levels: Lab Results  Component Value Date   HGBA1C 6.3 (A) 09/26/2023   HGBA1C 7.0 (H) 05/15/2022   HGBA1C 6.8 (H) 01/11/2022  03/12/2023: HbA1c 6.5%  Insulin pump:  -Medtronic -Tandem t:slim X2 - since 2022  CGM: -Dexcom G6 >> now G7  Insulin: -Humalog  Supplies: -[employer] for both pump and CGM  Pump settings: For days on: Basal rates for days on: 12 AM: 2.00 6 AM: 1.80 11 AM: 2.00  Insulin to carb ratios: 12 AM: 8  Insulin correction (sensitivity) factor: 12 AM: 10 >> 20 6 AM: 9 >> 20  Blood sugar target: 12 AM: 110   For days off: Basal rates for days off: 12 AM: 2.20 6 AM: 2.70  8 PM: 2.20  Insulin to carb ratios: 12 AM: 7.5   Insulin correction (sensitivity) factor: 12 AM: 20  Blood sugar target: 12 AM: 110  Active insulin time: 3 hrs  Total daily dose = 90.8 units (54% basal, 46% bolus)  - changes infusion site: q3 days TDD from basal insulin: 44% (42.08) >> 56% (42 units) TDD from bolus insulin: 56% (53.26) >> 44% (33 units) Total daily dose: 95-120 >> 73-100 units a day She had Levemir 36 units daily as backup.  She was also on: - Ozempic 1 mg weekly - but severe reflux >> now off  Meter: Freestyle  Pt checks her sugars more than 4 times a day with her Dexcom CGM:  Prev.     Lowest sugar was 40 >> 40s; she has hypoglycemia awareness at 70.   She has a glucagon kit at home. No previous hypoglycemia admission.  Highest sugar was 300 >> Prednisone: 350.+ previous DKA admissions - 2002.    Pt's meals are: - Breakfast: skips or snack - Lunch: apple, cheese with yoghurt - snack: 3 pm - nuts, fruit - Dinner: cauliflower with cheese, beans, rice - Snacks: no sweet drinks; drinks diet coke  - no CKD, last BUN/creatinine:  Lab Results  Component Value Date   BUN 10 11/26/2023   BUN 9 09/12/2023   CREATININE 0.75 11/26/2023   CREATININE 0.80 09/12/2023   Lab Results  Component Value Date   MICRALBCREAT 3.8 09/26/2023  She is on Cozaar 25 mg daily.  - + HL; last set of lipids: Lab Results  Component Value Date   CHOL 162 11/26/2023   HDL 76 11/26/2023   LDLCALC 69 11/26/2023   TRIG 96 11/26/2023   CHOLHDL 2 12/05/2022  On Crestor 10 mg daily along with omega-3 fatty acids.  - last eye exam was in 01/2024: + PDR OU. Dr. Ashley Royalty. She had B vitrectomies. Now also seeing Dr. Delynn Flavin ([facility]).  - no numbness and tingling in her feet.   Last TSH: Lab Results  Component Value Date   TSH 2.050 11/26/2023   Pt has FH of DMs in father.  Graves ds.:  -  Per review of Dr. Tonna Boehringer note, she was on methimazole in 2020 and stopped by herself in 2021.  Subsequent TFTs were normal: Lab Results  Component Value Date   TSH 2.050 11/26/2023   TSH 1.34 06/12/2023   TSH 1.04 03/14/2023   TSH 1.02 12/05/2022   TSH 1.03 05/15/2022   TSH 1.07 01/11/2022   TSH 0.88 12/15/2020   TSH 0.77 05/23/2020   TSH <0.01 Repeated and verified X2. (L) 01/04/2020   TSH 7.635 (H) 08/12/2019   TSH 0.010 (L) 11/19/2018   TSH 1.22 01/28/2017   TSH 3.666 05/08/2016   TSH 0.02 (L) 11/08/2010   Pt denies: - feeling nodules in neck - hoarseness - dysphagia - choking  She also has a history of RA, psoriasis, and OSA.  At last visit with Dr. Morrison Old she was off the CPAP and he recommended to restart it. She has a history of chronic pain  syndrome.  ROS: + see HPI  Past Medical History:  Diagnosis Date   ACUT MI ANTEROLAT WALL SUBSQT EPIS CARE    Acute maxillary sinusitis    ALLERGIC RHINITIS, SEASONAL    Anemia    Arthritis    ARTHRITIS, RHEUMATOID    shoulders and hands Enbrel>leg swelling Dr. Corliss Skains   Atrial fibrillation [facility])    a. after CABG.   Back pain    Bruxism (teeth grinding)    CAD, ARTERY BYPASS GRAFT    a. DES to RCA in 2010 then LAD occlusion s/p CABG 3 06/07/2009 with LIMA to LAD, reverse SVG to D1, reverse SVG to distal RCA. b. Cath 05/08/2016 slightly hypodense region in the intermediate branch, however she had excellent flow, FFR was normal. Vein graft to PDA and the posterolateral branch is patent, patent LIMA to LAD, occluded SVG to diagonal.   CARPAL TUNNEL SYNDROME, BILATERAL    CHOLELITHIASIS    Contrast media allergy    DERMATITIS, ALLERGIC    DIABETES MELLITUS, TYPE I    on insulin pump dx'ed age 58 y.o    DIABETIC  RETINOPATHY    Graves' disease    Heart attack (HCC)    Hiatal hernia    HYPERLIPIDEMIA-MIXED    Hypertension    HYPERTHYROIDISM    Dr. Osborne Casco   IBS (irritable bowel syndrome)    IDA (iron deficiency anemia)    Infertility, female    Insulin pump in place    Lymphadenopathy of head and neck    MIGRAINE W/O AURA W/INTRACT W/STATUS MIGRAINOSUS 02/19/2008   PONV (postoperative nausea and vomiting)    Psoriasis    Rheumatoid arthritis (HCC)    SINUS TACHYCARDIA 11/08/2010   Sleep apnea    not using machine yet    Stomach ulcer    SVT (supraventricular tachycardia) (HCC)    after s/p CABG   Tendonitis    TRIGGER FINGER    all fingers b/l hands    URI    Past Surgical History:  Procedure Laterality Date   ABDOMINAL HYSTERECTOMY     endometriomas b/l; total ? cervix removed; no h/o abnormal pap   Caesarean section     CARDIAC CATHETERIZATION N/A 05/08/2016   Procedure: Left Heart Cath and Cors/Grafts Angiography;  Surgeon: Kathleene Hazel, MD;   Location: [facility];  Service: Cardiovascular;  Laterality: N/A;   CARPAL TUNNEL RELEASE     b/l    CARPAL TUNNEL RELEASE Left 06/06/2021   Procedure: CARPAL TUNNEL RELEASE;  Surgeon: Cindee Salt, MD;  Location:  [facility];  Service: Orthopedics;  Laterality: Left;  AXILLARY BLOCK   CATARACT EXTRACTION, BILATERAL     CHOLECYSTECTOMY     COLONOSCOPY WITH PROPOFOL N/A 03/25/2020   Procedure: COLONOSCOPY WITH PROPOFOL;  Surgeon: Wyline Mood, MD;  Location: [facility] ENDOSCOPY;  Service: Gastroenterology;  Laterality: N/A;   CORONARY ARTERY BYPASS GRAFT     ESOPHAGOGASTRODUODENOSCOPY (EGD) WITH PROPOFOL N/A 03/25/2020   Procedure: ESOPHAGOGASTRODUODENOSCOPY (EGD) WITH PROPOFOL;  Surgeon: Wyline Mood, MD;  Location: [facility] ENDOSCOPY;  Service: Gastroenterology;  Laterality: N/A;   EYE SURGERY     laser x 2 for retinopathy    LEFT HEART CATH AND CORS/GRAFTS ANGIOGRAPHY N/A 02/22/2020   Procedure: LEFT HEART CATH AND CORS/GRAFTS ANGIOGRAPHY;  Surgeon: Kathleene Hazel, MD;  Location: [facility];  Service: Cardiovascular;  Laterality: N/A;   TRIGGER FINGER RELEASE     b/l fingers all    ULNAR NERVE TRANSPOSITION Left 06/06/2021   Procedure: ULNAR NERVE DECOMPRESSION;  Surgeon: Cindee Salt, MD;  Location: [facility];  Service: Orthopedics;  Laterality: Left;  AXILLARY BLOCK   VITRECTOMY     b/l    Social History   Socioeconomic History   Marital status: Divorced    Spouse name: Not on file   Number of children: Not on file   Years of education: Not on file   Highest education level: Not on file  Occupational History   Not on file  Tobacco Use   Smoking status: Never    Passive exposure: Never   Smokeless tobacco: Never  Vaping Use   Vaping status: Never Used  Substance and Sexual Activity   Alcohol use: Yes    Comment: rarely 1 every 6 months   Drug use: No   Sexual activity: Not Currently    Partners: Male    Birth control/protection: None   Other Topics Concern   Not on file  Social History Narrative   Divorced. Has 2 kids(fraternal twins) daughters age 25. Works at  [employer], Never smoked, denies ETOH, no drugs. Drinks diet coke. No exercise.       DPR daughters Efraim Kaufmann and Electra Paladino twins          Social Drivers of Health   Financial Resource Strain: Not on file  Food Insecurity: No Food Insecurity (05/22/2022)   Received from [facility], [facility]   Hunger Vital Sign    Worried About Running Out of Food in the Last Year: Never true    Ran Out of Food in the Last Year: Never true  Transportation Needs: Not on file  Physical Activity: Not on file  Stress: Not on file  Social Connections: Unknown (05/09/2022)   Received from [facility], [facility]   Social Network    Social Network: Not on file  Intimate Partner Violence: Unknown (04/03/2022)   Received from [facility], [facility]   HITS    Physically Hurt: Not on file    Insult or Talk Down To: Not on file    Threaten Physical Harm: Not on file    Scream or Curse: Not on file   Current Outpatient Medications on File Prior to Visit  Medication Sig Dispense Refill   Ascorbic Acid 500 MG CAPS Take by mouth.     aspirin EC 81 MG tablet Take 81 mg by mouth daily.     azithromycin (ZITHROMAX Z-PAK) 250 MG tablet Take 2 tablets on day 1 then 1 tablet daily 6 tablet 0  Blood Glucose Monitoring Suppl (FREESTYLE FREEDOM LITE) w/Device KIT Use as directed by physician to check blood sugar 1 kit 0   CHELATED MAGNESIUM PO Take 250 mg by mouth daily.     chlorpheniramine-HYDROcodone (TUSSIONEX) 10-8 MG/5ML Take 5 mLs by mouth every 12 (twelve) hours as needed. 115 mL 0   Cholecalciferol (VITAMIN D3) 125 MCG (5000 UT) CAPS Decrease to 1 tab every other day     clopidogrel (PLAVIX) 75 MG tablet TAKE 1 TABLET BY MOUTH ONCE DAILY 90 tablet 2   Continuous Blood Gluc Transmit (DEXCOM G6 TRANSMITTER) MISC Dispense and use as directed.  Change transmitter  every 90 days. 1 each 99   Cyanocobalamin (VITAMIN B-12 PO) Take by mouth.     cyclobenzaprine (FLEXERIL) 10 MG tablet Take 1 tablet (10 mg total) by mouth at bedtime as needed for muscle spasms. 30 tablet 2   EPINEPHrine 0.3 mg/0.3 mL IJ SOAJ injection Inject 0.3 mg into the muscle as needed for anaphylaxis. 2 each 2   fexofenadine (ALLEGRA) 180 MG tablet Take 1 tablet (180 mg total) by mouth daily. 90 tablet 1   fluticasone (FLONASE) 50 MCG/ACT nasal spray Place 2 sprays into both nostrils daily. 16 g 0   glucose blood (FREESTYLE LITE) test strip Use to check blood sugar 3 time(s) daily 100 each 3   insulin detemir (LEVEMIR) 100 UNIT/ML injection Inject 36 units into the skin once daily in the event of insulin pump failure. 10 mL 0   insulin lispro (HUMALOG) 100 UNIT/ML injection Inject 0.9-1.2 mLs (90-120 Units total) into the skin daily. 60 mL 3   Insulin Syringe-Needle U-100 (BD INSULIN SYRINGE U/F) 31G X 5/16" 0.3 ML MISC use with insulin injections 3 times daily in the event of insulin pump failure. 100 each 12   ipratropium (ATROVENT) 0.06 % nasal spray Place 2 sprays into both nostrils 4 (four) times daily. 15 mL 12   isosorbide mononitrate (IMDUR) 60 MG 24 hr tablet Take 1 tablet (60 mg total) by mouth daily. 90 tablet 3   Krill Oil 500 MG CAPS Take 500 mg by mouth daily.     Lancets (FREESTYLE) lancets Use to check blood sugar 3 time(s) daily. 100 each 99   leflunomide (ARAVA) 20 MG tablet Take 1 tablet (20 mg total) by mouth daily. 90 tablet 0   linaclotide (LINZESS) 290 MCG CAPS capsule Take 1 capsule (290 mcg total) by mouth daily before breakfast. 90 capsule 3   losartan (COZAAR) 100 MG tablet Take 1 tablet (100 mg total) by mouth daily. 90 tablet 3   metoprolol tartrate (LOPRESSOR) 50 MG tablet Take 0.5 tablets (25 mg total) by mouth 2 (two) times daily. 90 tablet 3   montelukast (SINGULAIR) 10 MG tablet Take 1 tablet (10 mg total) by mouth at bedtime. 30 tablet 3   Multiple  Vitamin (MULTIVITAMIN) capsule Take 1 capsule by mouth daily.     naltrexone (DEPADE) 50 MG tablet Take 0.5 tablets (25 mg total) by mouth daily. Called to [facility]- 3 mg x 1 week then 6 mg nightly- for 6 months     pantoprazole (PROTONIX) 40 MG tablet Take 1 tablet (40 mg total) by mouth 2 (two) times daily. 180 tablet 3   potassium chloride SA (KLOR-CON M20) 20 MEQ tablet Take 1 tablet (20 mEq total) by mouth daily. 90 tablet 3   predniSONE (STERAPRED UNI-PAK 21 TAB) 10 MG (21) TBPK tablet Take by mouth daily. Take 6 tabs by mouth  daily for 1, then 5 tabs for 1 day, then 4 tabs for 1 day, then 3 tabs for 1 day, then 2 tabs for 1 day, then 1 tab for 1 day. 21 tablet 0   rosuvastatin (CRESTOR) 20 MG tablet Take 1 tablet (20 mg total) by mouth daily. 90 tablet 2   torsemide (DEMADEX) 20 MG tablet Take 1 tablet (20 mg total) by mouth daily. 90 tablet 3   Turmeric (QC TUMERIC COMPLEX PO) Take 1,000 mg by mouth.     Upadacitinib ER (RINVOQ) 15 MG TB24 Take 1 tablet (15 mg) by mouth daily. 90 tablet 0   Current Facility-Administered Medications on File Prior to Visit  Medication Dose Route Frequency Provider Last Rate Last Admin   lidocaine (XYLOCAINE) 1 % (with pres) injection 6 mL  6 mL Other Once Lovorn, Megan, MD       Allergies  Allergen Reactions   Actemra [Tocilizumab]    Ramipril Swelling, Rash and Other (See Comments)   Shellfish-Derived Products Swelling    Shrimp    Atorvastatin Rash    Elevated LFT's   Compazine  [Prochlorperazine Edisylate] Other (See Comments)    Neurological reaction   Emgality [Galcanezumab-Gnlm] Hives and Swelling   Etanercept Swelling and Rash   Infliximab Rash   Iohexol     Iv contrast dye -rash all over   Orencia [Abatacept] Rash   Prochlorperazine Edisylate     unknown   Tofacitinib Rash and Other (See Comments)    Severe abdominal pain    Trokendi Engineer, civil (consulting) Er]     [employer], memory issues, word finding issues   Tramadol      Nausea    Amiodarone Nausea Only   Rituximab Rash    Causes a rash   Family History  Problem Relation Age of Onset   Depression Mother    Anxiety disorder Mother    Hypertension Mother    Hyperlipidemia Mother    Thyroid disease Mother    Bipolar disorder Mother    Sleep apnea Mother    Eating disorder Mother    Colon polyps Father    Diabetes Father        2   Hypertension Father    Parkinson's disease Father    Hyperlipidemia Father    Depression Father    Sleep apnea Father    Obesity Father    Stroke Paternal Grandmother    Heart disease Other    Hypertension Other    Hyperlipidemia Other    Depression Other    Migraines Other    Heart attack Neg Hx    Colon cancer Neg Hx    Stomach cancer Neg Hx    Esophageal cancer Neg Hx    PE: BP 130/60   Pulse 91   Ht 5' 1.25" (1.556 m)   Wt 176 lb 6.4 oz (80 kg)   SpO2 97%   BMI 33.06 kg/m  Wt Readings from Last 10 Encounters:  01/27/24 176 lb 6.4 oz (80 kg)  01/22/24 174 lb (78.9 kg)  12/12/23 178 lb 6.4 oz (80.9 kg)  12/11/23 174 lb (78.9 kg)  12/10/23 178 lb (80.7 kg)  11/26/23 178 lb (80.7 kg)  11/07/23 180 lb (81.6 kg)  11/01/23 183 lb 2 oz (83.1 kg)  10/28/23 183 lb (83 kg)  09/26/23 181 lb 3.2 oz (82.2 kg)   Constitutional: overweight, in NAD Eyes:  EOMI, no exophthalmos ENT: no neck masses, no cervical lymphadenopathy Cardiovascular: RRR, No MRG  Respiratory: CTA B Musculoskeletal: no deformities Skin:no rashes Neurological: no tremor with outstretched hands Diabetic Foot Exam - Simple   Simple Foot Form Diabetic Foot exam was performed with the following findings: Yes 01/27/2024  4:05 PM  Visual Inspection No deformities, no ulcerations, no other skin breakdown bilaterally: Yes Sensation Testing Intact to touch and monofilament testing bilaterally: Yes Pulse Check Posterior Tibialis and Dorsalis pulse intact bilaterally: Yes Comments    ASSESSMENT: 1. DM1, controlled, with complications -  CAD, h/o AMI - at 70, s/p stent, s/p CABG - DR, s/p vitrectomy  2. HL  3.  Graves' disease, in remission  4. Obesity class 1  PLAN:  1. Patient with longstanding fairly well-controlled type 1 diabetes.  She was previously on the Medtronic insulin pump for years, but she is doing well now on the t:slim X2 insulin pump integrated with the Dexcom CGM, starting 2022.  At last visit, HbA1c was better, at 6.3%.  Sugars were fluctuating mostly within range with occasional higher blood sugars after lunch and less so after dinner.  She did have occasional lows down to the 40s and, reviewing the pump downloads, it appears that these were related to taking insulin too late after the meals.  We discussed about trying to inject insulin 15 minutes before a meal but if the blood sugars remain low after meals, advised her to relax her ICR.  She had 2 sets of pump settings: 1 for when she is working (2 days a week) and 1 for when she is not working (5 days a week) CGM interpretation: -At today's visit, we reviewed her CGM downloads: It appears that 91% of values are in target range (goal >70%), while 7.8% are higher than 180 (goal <25%), and 0.9% are lower than 70 (goal <4%).  The calculated average blood sugar is 130.  The projected HbA1c for the next 3 months (GMI) is 6.4%. -Reviewing the CGM trends, sugars appear to be well-controlled, fluctuating within the target range with only mild occasional high blood sugars especially after dinner.  Overall, the control is exemplary for type 1 diabetes.  She does have occasional low blood sugars overnight which can happen mostly when she is working but sometimes even during the weekend, when she is not working.  I did advise her to reduce her basal rates from 12 AM to 6 AM.  We discussed that if she sees low blood sugars afterwards, especially in the setting of her weight loss journey, she may relax her insulin to carb ratios, but I did not recommend this for now.  She also  has some instances in which she only took her insulin after the sugars already high after the meal and we discussed about the need to start the boluses 15 minutes before meals.  She is trying to do so. -I would not suggest other changes in her regimen for now. - I suggested to: Patient Instructions  Please use the following pump settings: For days on: Basal rates for days on: 12 AM: 2.00 >> 1.90 6 AM: 1.80 11 AM: 2.00  Insulin to carb ratios: 12 AM: 8 (may need to change to 9)  Insulin correction (sensitivity) factor: 12 AM: 20 6 AM: 20  Blood sugar target: 12 AM: 110   For days off: Basal rates for days off: 12 AM: 2.20 >> 2.10 6 AM: 2.70  8 PM: 2.20  Insulin to carb ratios: 12 AM: 7.5 (may need to change to 8.5)  Insulin correction (sensitivity) factor:  12 AM: 20  Blood sugar target: 12 AM: 110  Active insulin time: 3 hrs  Please return in 3 to 4 months.   - we checked her HbA1c: 6.0% (lower) - advised to check sugars at different times of the day - 4x a day, rotating check times - advised for yearly eye exams >> she is UTD - return to clinic in 3-4 months  2. HL - Reviewed latest lipid panel from 11/2022: All fractions at goal: Lab Results  Component Value Date   CHOL 162 11/26/2023   HDL 76 11/26/2023   LDLCALC 69 11/26/2023   TRIG 96 11/26/2023   CHOLHDL 2 12/05/2022  - She continues on Crestor 10 mg daily, omega-3 fatty acids-  no side effects    3. Graves' disease, in remission -She continues off methimazole -TFTs were normal since 2021; latest TSH normal in 11/2023: Lab Results  Component Value Date   TSH 2.050 11/26/2023  -No thyrotoxic signs or symptoms at today's visit -Will continue to follow her expectantly  4.  Obesity class I -She was not able to tolerate Ozempic due to reflux -Reviewing her diet today, she appears to have smaller meals, so I would not recommend to decrease them even more, but redistribute the meals with a lighter  meal at night. -I did suggest a referral to the weight management clinic and we discussed about different diet plans.  She is now seeing the weight management clinic.  5 pounds since last visit  Carlus Pavlov, MD PhD [facility] Endocrinology

## 2024-02-03 ENCOUNTER — Ambulatory Visit: Payer: Commercial Managed Care - PPO | Admitting: Physical Medicine and Rehabilitation

## 2024-02-06 ENCOUNTER — Other Ambulatory Visit: Payer: Self-pay

## 2024-02-06 ENCOUNTER — Encounter: Payer: Self-pay | Admitting: Nurse Practitioner

## 2024-02-06 ENCOUNTER — Ambulatory Visit: Payer: Commercial Managed Care - PPO | Admitting: Nurse Practitioner

## 2024-02-06 VITALS — BP 116/70 | HR 70 | Temp 98.7°F | Ht 61.25 in | Wt 174.6 lb

## 2024-02-06 DIAGNOSIS — J069 Acute upper respiratory infection, unspecified: Secondary | ICD-10-CM | POA: Diagnosis not present

## 2024-02-06 DIAGNOSIS — J029 Acute pharyngitis, unspecified: Secondary | ICD-10-CM | POA: Diagnosis not present

## 2024-02-06 DIAGNOSIS — R059 Cough, unspecified: Secondary | ICD-10-CM

## 2024-02-06 LAB — POCT INFLUENZA A/B
Influenza A, POC: NEGATIVE
Influenza B, POC: NEGATIVE

## 2024-02-06 MED ORDER — AMOXICILLIN-POT CLAVULANATE 875-125 MG PO TABS
1.0000 | ORAL_TABLET | Freq: Two times a day (BID) | ORAL | 0 refills | Status: DC
  Filled 2024-02-06: qty 20, 10d supply, fill #0

## 2024-02-06 MED ORDER — GUAIFENESIN-CODEINE 100-10 MG/5ML PO SOLN
5.0000 mL | Freq: Every day | ORAL | 0 refills | Status: DC
  Filled 2024-02-06: qty 108, 21d supply, fill #0
  Filled 2024-02-06: qty 10, 2d supply, fill #0

## 2024-02-06 NOTE — Progress Notes (Signed)
 Established Patient Office Visit  Subjective:  Patient ID: [name], female    DOB: January 25, 1966  Age: 58 y.o. MRN: [id number]  CC:  Chief Complaint  Patient presents with   Cough    Cough, sore throat, bilateral ear pain, bodyaches and chills x 2 days    HPI  [name] presents for URI symptoms.    URI  This is a new problem. The current episode started in the past 7 days. The problem has been rapidly worsening. There has been no fever. Associated symptoms include congestion, coughing, diarrhea, ear pain, headaches, nausea, a plugged ear sensation, sinus pain and a sore throat. Pertinent negatives include no dysuria, rhinorrhea, swollen glands, vomiting or wheezing. She has tried antihistamine and increased fluids for the symptoms. The treatment provided mild relief.  Home COVID test negative.  Past Medical History:  Diagnosis Date   ACUT MI ANTEROLAT WALL SUBSQT EPIS CARE    Acute maxillary sinusitis    ALLERGIC RHINITIS, SEASONAL    Anemia    Arthritis    ARTHRITIS, RHEUMATOID    shoulders and hands Enbrel>leg swelling Dr. Dolphus   Atrial fibrillation [facility])    a. after CABG.   Back pain    Bruxism (teeth grinding)    CAD, ARTERY BYPASS GRAFT    a. DES to RCA in 2010 then LAD occlusion s/p CABG 3 06/07/2009 with LIMA to LAD, reverse SVG to D1, reverse SVG to distal RCA. b. Cath 05/08/2016 slightly hypodense region in the intermediate branch, however she had excellent flow, FFR was normal. Vein graft to PDA and the posterolateral branch is patent, patent LIMA to LAD, occluded SVG to diagonal.   CARPAL TUNNEL SYNDROME, BILATERAL    CHOLELITHIASIS    Contrast media allergy    DERMATITIS, ALLERGIC    DIABETES MELLITUS, TYPE I    on insulin  pump dx'ed age 3 y.o    DIABETIC  RETINOPATHY    Graves' disease    Heart attack (HCC)    Hiatal hernia    HYPERLIPIDEMIA-MIXED    Hypertension    HYPERTHYROIDISM    Dr. Hosie Balder   IBS (irritable bowel syndrome)     IDA (iron  deficiency anemia)    Infertility, female    Insulin  pump in place    Lymphadenopathy of head and neck    MIGRAINE W/O AURA W/INTRACT W/STATUS MIGRAINOSUS 02/19/2008   PONV (postoperative nausea and vomiting)    Psoriasis    Rheumatoid arthritis (HCC)    SINUS TACHYCARDIA 11/08/2010   Sleep apnea    not using machine yet    Stomach ulcer    SVT (supraventricular tachycardia) (HCC)    after s/p CABG   Tendonitis    TRIGGER FINGER    all fingers b/l hands    URI     Past Surgical History:  Procedure Laterality Date   ABDOMINAL HYSTERECTOMY     endometriomas b/l; total ? cervix removed; no h/o abnormal pap   Caesarean section     CARDIAC CATHETERIZATION N/A 05/08/2016   Procedure: Left Heart Cath and Cors/Grafts Angiography;  Surgeon: Lonni JONETTA Cash, MD;  Location: [facility];  Service: Cardiovascular;  Laterality: N/A;   CARPAL TUNNEL RELEASE     b/l    CARPAL TUNNEL RELEASE Left 06/06/2021   Procedure: CARPAL TUNNEL RELEASE;  Surgeon: Murrell Kuba, MD;  Location: [facility];  Service: Orthopedics;  Laterality: Left;  AXILLARY BLOCK   CATARACT EXTRACTION, BILATERAL  CHOLECYSTECTOMY     COLONOSCOPY WITH PROPOFOL  N/A 03/25/2020   Procedure: COLONOSCOPY WITH PROPOFOL ;  Surgeon: Therisa Bi, MD;  Location: [facility];  Service: Gastroenterology;  Laterality: N/A;   CORONARY ARTERY BYPASS GRAFT     ESOPHAGOGASTRODUODENOSCOPY (EGD) WITH PROPOFOL  N/A 03/25/2020   Procedure: ESOPHAGOGASTRODUODENOSCOPY (EGD) WITH PROPOFOL ;  Surgeon: Therisa Bi, MD;  Location: [facility] ENDOSCOPY;  Service: Gastroenterology;  Laterality: N/A;   EYE SURGERY     laser x 2 for retinopathy    LEFT HEART CATH AND CORS/GRAFTS ANGIOGRAPHY N/A 02/22/2020   Procedure: LEFT HEART CATH AND CORS/GRAFTS ANGIOGRAPHY;  Surgeon: Verlin Lonni BIRCH, MD;  Location: [facility];  Service: Cardiovascular;  Laterality: N/A;   TRIGGER FINGER RELEASE     b/l fingers all     ULNAR NERVE TRANSPOSITION Left 06/06/2021   Procedure: ULNAR NERVE DECOMPRESSION;  Surgeon: Murrell Kuba, MD;  Location: [facility];  Service: Orthopedics;  Laterality: Left;  AXILLARY BLOCK   VITRECTOMY     b/l     Family History  Problem Relation Age of Onset   Depression Mother    Anxiety disorder Mother    Hypertension Mother    Hyperlipidemia Mother    Thyroid  disease Mother    Bipolar disorder Mother    Sleep apnea Mother    Eating disorder Mother    Colon polyps Father    Diabetes Father        2   Hypertension Father    Parkinson's disease Father    Hyperlipidemia Father    Depression Father    Sleep apnea Father    Obesity Father    Stroke Paternal Grandmother    Heart disease Other    Hypertension Other    Hyperlipidemia Other    Depression Other    Migraines Other    Heart attack Neg Hx    Colon cancer Neg Hx    Stomach cancer Neg Hx    Esophageal cancer Neg Hx     Social History   Socioeconomic History   Marital status: Divorced    Spouse name: Not on file   Number of children: Not on file   Years of education: Not on file   Highest education level: Not on file  Occupational History   Not on file  Tobacco Use   Smoking status: Never    Passive exposure: Never   Smokeless tobacco: Never  Vaping Use   Vaping status: Never Used  Substance and Sexual Activity   Alcohol use: Yes    Comment: rarely 1 every 6 months   Drug use: No   Sexual activity: Not Currently    Partners: Male    Birth control/protection: None  Other Topics Concern   Not on file  Social History Narrative   Divorced. Has 2 kids(fraternal twins) daughters age 54. Works at  [employer], Never smoked, denies ETOH, no drugs. Drinks diet coke. No exercise.       DPR daughters Lisa and [name] twins          Social Drivers of Health   Financial Resource Strain: Not on file  Food Insecurity: No Food Insecurity (05/22/2022)   Received from [facility],  [facility]   Hunger Vital Sign    Worried About Running Out of Food in the Last Year: Never true    Ran Out of Food in the Last Year: Never true  Transportation Needs: Not on file  Physical Activity: Not on file  Stress: Not on file  Social Connections: Unknown (05/09/2022)   Received from [facility], [facility]   Social Network    Social Network: Not on file  Intimate Partner Violence: Unknown (04/03/2022)   Received from [facility], [facility]   HITS    Physically Hurt: Not on file    Insult or Talk Down To: Not on file    Threaten Physical Harm: Not on file    Scream or Curse: Not on file     Outpatient Medications Prior to Visit  Medication Sig Dispense Refill   Ascorbic Acid 500 MG CAPS Take by mouth.     aspirin  EC 81 MG tablet Take 81 mg by mouth daily.     Blood Glucose Monitoring Suppl (FREESTYLE FREEDOM LITE) w/Device KIT Use as directed by physician to check blood sugar 1 kit 0   CHELATED MAGNESIUM PO Take 250 mg by mouth daily.     Cholecalciferol (VITAMIN D3) 125 MCG (5000 UT) CAPS Decrease to 1 tab every other day     clopidogrel  (PLAVIX ) 75 MG tablet TAKE 1 TABLET BY MOUTH ONCE DAILY 90 tablet 2   Cyanocobalamin  (VITAMIN B-12 PO) Take by mouth.     cyclobenzaprine  (FLEXERIL ) 10 MG tablet Take 1 tablet (10 mg total) by mouth at bedtime as needed for muscle spasms. 30 tablet 2   EPINEPHrine  0.3 mg/0.3 mL IJ SOAJ injection Inject 0.3 mg into the muscle as needed for anaphylaxis. 2 each 2   fexofenadine  (ALLEGRA ) 180 MG tablet Take 1 tablet (180 mg total) by mouth daily. 90 tablet 1   fluticasone  (FLONASE ) 50 MCG/ACT nasal spray Place 2 sprays into both nostrils daily. 16 g 0   glucose blood (FREESTYLE LITE) test strip Use to check blood sugar 3 time(s) daily 100 each 3   insulin  detemir (LEVEMIR ) 100 UNIT/ML injection Inject 36 units into the skin once daily in the event of insulin  pump failure. 10 mL 0   insulin  lispro (HUMALOG ) 100 UNIT/ML injection  Inject 0.9-1.2 mLs (90-120 Units total) into the skin daily. 60 mL 3   Insulin  Syringe-Needle U-100 (BD INSULIN  SYRINGE U/F) 31G X 5/16 0.3 ML MISC use with insulin  injections 3 times daily in the event of insulin  pump failure. 100 each 12   ipratropium (ATROVENT ) 0.06 % nasal spray Place 2 sprays into both nostrils 4 (four) times daily. 15 mL 12   isosorbide  mononitrate (IMDUR ) 60 MG 24 hr tablet Take 1 tablet (60 mg total) by mouth daily. 90 tablet 3   Krill Oil 500 MG CAPS Take 500 mg by mouth daily.     Lancets (FREESTYLE) lancets Use to check blood sugar 3 time(s) daily. 100 each 99   leflunomide  (ARAVA ) 20 MG tablet Take 1 tablet (20 mg total) by mouth daily. 90 tablet 0   linaclotide  (LINZESS ) 290 MCG CAPS capsule Take 1 capsule (290 mcg total) by mouth daily before breakfast. 90 capsule 3   losartan  (COZAAR ) 100 MG tablet Take 1 tablet (100 mg total) by mouth daily. 90 tablet 3   metoprolol  tartrate (LOPRESSOR ) 50 MG tablet Take 0.5 tablets (25 mg total) by mouth 2 (two) times daily. 90 tablet 3   montelukast  (SINGULAIR ) 10 MG tablet Take 1 tablet (10 mg total) by mouth at bedtime. 30 tablet 3   Multiple Vitamin (MULTIVITAMIN) capsule Take 1 capsule by mouth daily.     pantoprazole  (PROTONIX ) 40 MG tablet Take 1 tablet (40 mg total) by mouth 2 (two) times daily.  180 tablet 3   potassium chloride  SA (KLOR-CON  M20) 20 MEQ tablet Take 1 tablet (20 mEq total) by mouth daily. 90 tablet 3   rosuvastatin  (CRESTOR ) 20 MG tablet Take 1 tablet (20 mg total) by mouth daily. 90 tablet 2   torsemide  (DEMADEX ) 20 MG tablet Take 1 tablet (20 mg total) by mouth daily. 90 tablet 3   Turmeric (QC TUMERIC COMPLEX PO) Take 1,000 mg by mouth.     Upadacitinib  ER (RINVOQ ) 15 MG TB24 Take 1 tablet (15 mg) by mouth daily. 90 tablet 0   naltrexone  (DEPADE) 50 MG tablet Take 0.5 tablets (25 mg total) by mouth daily. Called to [facility]- 3 mg x 1 week then 6 mg nightly- for 6 months      Facility-Administered Medications Prior to Visit  Medication Dose Route Frequency Provider Last Rate Last Admin   lidocaine  (XYLOCAINE ) 1 % (with pres) injection 6 mL  6 mL Other Once Lovorn, Megan, MD        Allergies  Allergen Reactions   Actemra  [Tocilizumab ]    Ramipril Swelling, Rash and Other (See Comments)   Shellfish-Derived Products Swelling    Shrimp    Atorvastatin Rash    Elevated LFT's   Compazine  [Prochlorperazine Edisylate] Other (See Comments)    Neurological reaction   Emgality  [Galcanezumab -Gnlm] Hives and Swelling   Etanercept Swelling and Rash   Infliximab Rash   Iohexol      Iv contrast dye -rash all over   Orencia [Abatacept] Rash   Prochlorperazine Edisylate     unknown   Tofacitinib Rash and Other (See Comments)    Severe abdominal pain    Trokendi  Xr [Topiramate  Er]     Brain fog, memory issues, word finding issues   Tramadol      Nausea    Amiodarone Nausea Only   Rituximab Rash    Causes a rash    ROS Review of Systems  HENT:  Positive for congestion, ear pain, sinus pain and sore throat. Negative for rhinorrhea.   Respiratory:  Positive for cough. Negative for wheezing.   Gastrointestinal:  Positive for diarrhea and nausea. Negative for vomiting.  Genitourinary:  Negative for dysuria.  Neurological:  Positive for headaches.   Negative unless indicated in HPI.    Objective:    Physical Exam Constitutional:      Appearance: Normal appearance.  HENT:     Right Ear: Tympanic membrane normal. Tympanic membrane is not erythematous.     Left Ear: Tympanic membrane normal. Tympanic membrane is not erythematous.     Nose:     Right Turbinates: Not enlarged.     Left Turbinates: Not enlarged.     Right Sinus: No maxillary sinus tenderness or frontal sinus tenderness.     Left Sinus: No maxillary sinus tenderness or frontal sinus tenderness.     Mouth/Throat:     Mouth: Mucous membranes are moist.     Pharynx: Postnasal drip present.  No pharyngeal swelling, oropharyngeal exudate or posterior oropharyngeal erythema.     Tonsils: No tonsillar exudate.  Cardiovascular:     Rate and Rhythm: Normal rate and regular rhythm.  Pulmonary:     Effort: Pulmonary effort is normal.     Breath sounds: Normal breath sounds. No stridor. No wheezing.  Neurological:     General: No focal deficit present.     Mental Status: She is alert and oriented to person, place, and time. Mental status is at baseline.  Psychiatric:  Mood and Affect: Mood normal.        Behavior: Behavior normal.        Thought Content: Thought content normal.        Judgment: Judgment normal.     BP 116/70   Pulse 70   Temp 98.7 F (37.1 C)   Ht 5' 1.25 (1.556 m)   Wt 174 lb 9.6 oz (79.2 kg)   SpO2 97%   BMI 32.72 kg/m  Wt Readings from Last 3 Encounters:  02/10/24 179 lb (81.2 kg)  02/06/24 174 lb 9.6 oz (79.2 kg)  01/27/24 176 lb 6.4 oz (80 kg)     Health Maintenance  Topic Date Due   COVID-19 Vaccine (3 - Pfizer risk series) 01/11/2025 (Originally 02/15/2020)   Diabetic kidney evaluation - Urine ACR  09/25/2024   Diabetic kidney evaluation - eGFR measurement  11/25/2024   MAMMOGRAM  01/18/2025   DTaP/Tdap/Td (4 - Td or Tdap) 06/25/2028   Colonoscopy  03/25/2030   Pneumococcal Vaccine 75-41 Years old (3 of 3 - PPSV23 or PCV20) 10/30/2032   INFLUENZA VACCINE  Completed   Hepatitis C Screening  Completed   HIV Screening  Completed   Zoster Vaccines- Shingrix  Completed   HPV VACCINES  Aged Out   FOOT EXAM  Discontinued   HEMOGLOBIN A1C  Discontinued   OPHTHALMOLOGY EXAM  Discontinued    There are no preventive care reminders to display for this patient.  Lab Results  Component Value Date   TSH 2.050 11/26/2023   Lab Results  Component Value Date   WBC 3.7 (L) 12/12/2023   HGB 11.2 (L) 12/12/2023   HCT 32.1 (L) 12/12/2023   MCV 87.5 12/12/2023   PLT 225.0 12/12/2023   Lab Results  Component Value Date   NA 140  11/26/2023   K 3.9 11/26/2023   CO2 24 11/26/2023   GLUCOSE 91 11/26/2023   BUN 10 11/26/2023   CREATININE 0.75 11/26/2023   BILITOT 0.3 11/26/2023   ALKPHOS 76 11/26/2023   AST 24 11/26/2023   ALT 15 11/26/2023   PROT 6.1 11/26/2023   ALBUMIN 4.4 11/26/2023   CALCIUM  9.1 11/26/2023   ANIONGAP 9 06/01/2021   EGFR 93 11/26/2023   GFR 70.04 06/12/2023   Lab Results  Component Value Date   CHOL 162 11/26/2023   Lab Results  Component Value Date   HDL 76 11/26/2023   Lab Results  Component Value Date   LDLCALC 69 11/26/2023   Lab Results  Component Value Date   TRIG 96 11/26/2023   Lab Results  Component Value Date   CHOLHDL 2 12/05/2022   Lab Results  Component Value Date   HGBA1C 6.0 (A) 01/27/2024      Assessment & Plan:  Sore throat -     POCT Influenza A/B  URI with cough and congestion Assessment & Plan: Pt is afebrile, non toxic appearing, without respiratoty distress.  Home COVID test negative.  Tested negative for flu in the office - Will treat with Augmentin  and cough syrup -Advised to take plain Mucinex  and OTC antihistamine for PND. -Increase fluid intake and rest.    Other orders -     Amoxicillin -Pot Clavulanate; Take 1 tablet by mouth 2 (two) times daily.  Dispense: 20 tablet; Refill: 0 -     guaiFENesin -Codeine ; Take 5 mLs by mouth at bedtime.  Dispense: 118 mL; Refill: 0    Follow-up: No follow-ups on file.   Betsaida Missouri, NP

## 2024-02-07 ENCOUNTER — Other Ambulatory Visit: Payer: Self-pay

## 2024-02-10 ENCOUNTER — Other Ambulatory Visit: Payer: Self-pay

## 2024-02-10 ENCOUNTER — Encounter
Payer: Commercial Managed Care - PPO | Attending: Physical Medicine and Rehabilitation | Admitting: Physical Medicine and Rehabilitation

## 2024-02-10 ENCOUNTER — Encounter: Payer: Self-pay | Admitting: Physical Medicine and Rehabilitation

## 2024-02-10 VITALS — BP 127/79 | HR 81 | Ht 60.0 in | Wt 179.0 lb

## 2024-02-10 DIAGNOSIS — Z5181 Encounter for therapeutic drug level monitoring: Secondary | ICD-10-CM | POA: Diagnosis not present

## 2024-02-10 DIAGNOSIS — G894 Chronic pain syndrome: Secondary | ICD-10-CM | POA: Diagnosis not present

## 2024-02-10 DIAGNOSIS — G8929 Other chronic pain: Secondary | ICD-10-CM | POA: Insufficient documentation

## 2024-02-10 DIAGNOSIS — M7918 Myalgia, other site: Secondary | ICD-10-CM | POA: Diagnosis not present

## 2024-02-10 MED ORDER — LIDOCAINE HCL 1 % IJ SOLN: 6.0000 mL | Freq: Once | INTRAMUSCULAR | Status: AC

## 2024-02-10 MED ORDER — ONDANSETRON HCL 4 MG PO TABS
4.0000 mg | ORAL_TABLET | Freq: Three times a day (TID) | ORAL | 5 refills | Status: DC | PRN
  Filled 2024-02-10: qty 90, 30d supply, fill #0

## 2024-02-10 MED ORDER — ACETAMINOPHEN-CODEINE 300-30 MG PO TABS
1.0000 | ORAL_TABLET | Freq: Two times a day (BID) | ORAL | 0 refills | Status: DC | PRN
  Filled 2024-02-10 – 2024-02-11 (×2): qty 14, 7d supply, fill #0

## 2024-02-10 NOTE — Patient Instructions (Signed)
 Plan: Suggest steroid inhaler- and get bottom of this- and    2. Again, I suggest a CT chest- since been 4x in year.   3. Also suggest referral to Allergiy vs Pulmonary.   4. Tramadol  makes her nauseated and Duloxetine  made her depressed.    5. Stop Low dose Naltrexone   since not working.  Almost out, so doesn't want to get a refill.  Just stop it-    6.  Will start Tylenol  #3-   Will need to UDS and opiate contract- per clinic policy.  Has been taking codeine  cough syrup of note.   7.  If gets nauseated with Tylenol  #3, take Zofran -  #90- 5 refills.    8. Patient here for trigger point injections for myofascial pain  Consent done and on chart.  Cleaned areas with alcohol and injected using a 27 gauge 1.5 inch needle  Injected  3cc none wasted Using 1% Lidocaine  with no EPI  Upper traps B/L  Levators- B/L  Posterior scalenes Middle scalenes- B/ L Splenius Capitus Pectoralis Major Rhomboids- R only Infraspinatus Teres Major/minor Thoracic paraspinals Lumbar paraspinals Other injections-    Patient's level of pain prior was  7/10 Current level of pain after injections is about same  There was no bleeding or complications.  Patient was advised to drink a lot of water on day after injections to flush system Will have increased soreness for 12-48 hours after injections.  Can use Lidocaine  patches the day AFTER injections Can use theracane on day of injections in places didn't inject Can use heating pad 4-6 hours AFTER injections  9. Episport tennis elbow brace- epicondylitis clasp- demonstrated /showed pt what it looks like. It helps you not put elbow where it's bothering you- also can attempt to do steroid injection if it doesn't work, but let's try to avoid steroids if possible.    10. F/U in 6 weeks- f/u on Trp injections and f/u on pain.

## 2024-02-10 NOTE — Progress Notes (Signed)
 Pt is a 58 yr old female with hx of RA, (-) rh factor, thoracic DDD, DM with retinopathy- Ac 6.5, migraines without aura, and MI/CABG in past-  Also has Graves disease; and just dx'd with tendinitis in B/L hamstring tendinosis- Here for f/u of chronic pain and trigger point injections .         Things "OK" Not sure about Low dose Naltrexone - not sure if helping a lot.  Doesn't feel much difference with it.    Cough got better after steroids, etc- no one ever got her a CT of chest- and got again in November- and did CXR at Urgent care-  and just went Thursday and has again.    Calling tomorrow- 4th time in 1 year and coughs so hard at night- feels like will cough lung out- and not getting sleep even with codeine  cough meds, and even that not helping a lot.  Has albuterol  inhaler- rescue inhaler.   A1c 6.0!!  Lidocaine  patches help as well as theracane both help- just not enough  Pain is throughout the day-     Plan: Suggest steroid inhaler- and get bottom of this- and    2. Again, I suggest a CT chest- since been 4x in year.   3. Also suggest referral to Allergiy vs Pulmonary.   4. Tramadol  makes her nauseated and Duloxetine  made her depressed.    5. Stop Low dose Naltrexone   since not working.  Almost out, so doesn't want to get a refill.  Just stop it-    6.  Will start Tylenol  #3-   Will need to UDS and opiate contract- per clinic policy.  Has been taking codeine  cough syrup of note.   7.  If gets nauseated with Tylenol  #3, take Zofran -  #90- 5 refills.    8. Patient here for trigger point injections for myofascial pain  Consent done and on chart.  Cleaned areas with alcohol and injected using a 27 gauge 1.5 inch needle  Injected  3cc none wasted Using 1% Lidocaine  with no EPI  Upper traps B/L  Levators- B/L  Posterior scalenes Middle scalenes- B/ L Splenius Capitus Pectoralis Major Rhomboids- R only Infraspinatus Teres Major/minor Thoracic  paraspinals Lumbar paraspinals Other injections-    Patient's level of pain prior was  7/10 Current level of pain after injections is about same  There was no bleeding or complications.  Patient was advised to drink a lot of water on day after injections to flush system Will have increased soreness for 12-48 hours after injections.  Can use Lidocaine  patches the day AFTER injections Can use theracane on day of injections in places didn't inject Can use heating pad 4-6 hours AFTER injections  9. Episport tennis elbow brace- epicondylitis clasp- demonstrated /showed pt what it looks like. It helps you not put elbow where it's bothering you- also can attempt to do steroid injection if it doesn't work, but let's try to avoid steroids if possible.    10. F/U in 6 weeks- f/u on Trp injections and f/u on pain.    I spent a total of 37   minutes on total care today- >50% coordination of care- due to  as detailed above- UDS, opiate contract, starting new meds- stopping meds and then 7 minutes on injections.

## 2024-02-11 ENCOUNTER — Other Ambulatory Visit: Payer: Self-pay

## 2024-02-11 ENCOUNTER — Telehealth: Payer: Self-pay | Admitting: Nurse Practitioner

## 2024-02-11 DIAGNOSIS — G894 Chronic pain syndrome: Secondary | ICD-10-CM | POA: Diagnosis not present

## 2024-02-11 DIAGNOSIS — Z5181 Encounter for therapeutic drug level monitoring: Secondary | ICD-10-CM | POA: Diagnosis not present

## 2024-02-11 DIAGNOSIS — G8929 Other chronic pain: Secondary | ICD-10-CM | POA: Diagnosis not present

## 2024-02-11 NOTE — Telephone Encounter (Signed)
Copied from CRM [id number]. Topic: Clinical - Medical Advice >> Feb 11, 2024  1:13 PM Isabell A wrote: Reason for CRM: Patient would like to discuss with Audria Nine her symptoms that won't go away (sore throat/ear pain), she was seen at another location on 2/5. Patient would like to confirm if he can call her to discuss or does she need to come in for an appointment.

## 2024-02-11 NOTE — Assessment & Plan Note (Signed)
Pt is afebrile, non toxic appearing, without respiratoty distress.  Home COVID test negative.  Tested negative for flu in the office - Will treat with Augmentin and cough syrup -Advised to take plain Mucinex and OTC antihistamine for PND. -Increase fluid intake and rest.

## 2024-02-12 ENCOUNTER — Telehealth: Payer: Self-pay

## 2024-02-12 NOTE — Telephone Encounter (Signed)
Copied from CRM [id number]. Topic: Clinical - Medical Advice >> Feb 12, 2024  8:14 AM Adaysia C wrote: Reason for CRM: Patient has requested a follow up call about her symptoms that are not improving. I offered her the option to speak with a triage nurse but she declined and would prefer for the provider to call her back. Please follow up with patient [phone]

## 2024-02-12 NOTE — Telephone Encounter (Signed)
Error

## 2024-02-12 NOTE — Telephone Encounter (Signed)
Called and spoke with patient. She has an appointment with me on 02/13/2024. We will discuss further at office visit

## 2024-02-12 NOTE — Telephone Encounter (Deleted)
Copied from CRM [id number]. Topic: Clinical - Medical Advice >> Feb 12, 2024  8:14 AM Adaysia C wrote: Reason for CRM: Patient has requested a follow up call about her symptoms that are not improving. I offered her the option to speak with a triage nurse but she declined and would prefer for the provider to call her back. Please follow up with patient [phone]

## 2024-02-13 ENCOUNTER — Other Ambulatory Visit: Payer: Self-pay

## 2024-02-13 ENCOUNTER — Ambulatory Visit: Payer: Commercial Managed Care - PPO | Admitting: Nurse Practitioner

## 2024-02-13 VITALS — BP 110/60 | HR 61 | Temp 98.3°F | Ht 60.0 in | Wt 176.6 lb

## 2024-02-13 DIAGNOSIS — J22 Unspecified acute lower respiratory infection: Secondary | ICD-10-CM | POA: Diagnosis not present

## 2024-02-13 DIAGNOSIS — B379 Candidiasis, unspecified: Secondary | ICD-10-CM | POA: Diagnosis not present

## 2024-02-13 DIAGNOSIS — Z8619 Personal history of other infectious and parasitic diseases: Secondary | ICD-10-CM | POA: Diagnosis not present

## 2024-02-13 DIAGNOSIS — R051 Acute cough: Secondary | ICD-10-CM | POA: Diagnosis not present

## 2024-02-13 LAB — TOXASSURE SELECT,+ANTIDEPR,UR

## 2024-02-13 MED ORDER — FLUCONAZOLE 150 MG PO TABS
150.0000 mg | ORAL_TABLET | ORAL | 1 refills | Status: AC
  Filled 2024-02-13: qty 1, 1d supply, fill #0

## 2024-02-13 MED ORDER — FLUTICASONE-SALMETEROL 100-50 MCG/ACT IN AEPB
1.0000 | INHALATION_SPRAY | Freq: Two times a day (BID) | RESPIRATORY_TRACT | 1 refills | Status: AC
  Filled 2024-02-13: qty 60, 30d supply, fill #0
  Filled 2024-07-02 – 2024-07-17 (×3): qty 60, 30d supply, fill #1

## 2024-02-13 MED ORDER — DOXYCYCLINE HYCLATE 100 MG PO TABS
100.0000 mg | ORAL_TABLET | Freq: Two times a day (BID) | ORAL | 0 refills | Status: AC
  Filled 2024-02-13: qty 14, 7d supply, fill #0

## 2024-02-13 NOTE — Patient Instructions (Signed)
Nice to see you today Keep me updated on how you do Keep the pulmonology appointment

## 2024-02-13 NOTE — Progress Notes (Signed)
Acute Office Visit  Subjective:     Patient ID: [name], female    DOB: 03-Oct-1966, 58 y.o.   MRN: [id number]  Chief Complaint  Patient presents with   Cough    Pt complains of reoccurring cough. States that she is not improving.  States that OTC or prescribed medication are not helping. No chest pain. Complains of dizziness when coughing now. Mucus is mostly clear or white.      Patient is in today for cough with a history of HTN. Afib, CAD, CHF, DM1, RA,   Patient was seen on 02/06/2024 by charanpreet kaur. She was dx with a uri with a negative covid test. She was prescirbed augmentin and guaifenesin-codeine and is here for a follow up  States that she is taking the medications as prescirbed. State that the sinus pressure has resovled but she states that the cough got worse after the office visit and has stayed Has tried mucinex, robitussin, honey, robitussin. States that the prescribed cough syrup that helps her sleep   Review of Systems  Constitutional:  Positive for chills and malaise/fatigue. Negative for fever.  HENT:  Positive for ear pain. Negative for ear discharge.   Respiratory:  Positive for cough and wheezing. Negative for shortness of breath.   Gastrointestinal:  Positive for diarrhea and nausea. Negative for abdominal pain and vomiting.  Neurological:  Positive for dizziness and headaches.        Objective:    BP 110/60   Pulse 61   Temp 98.3 F (36.8 C) (Oral)   Ht 5' (1.524 m)   Wt 176 lb 9.6 oz (80.1 kg)   SpO2 94%   BMI 34.49 kg/m  BP Readings from Last 3 Encounters:  02/13/24 110/60  02/10/24 127/79  02/06/24 116/70   Wt Readings from Last 3 Encounters:  02/13/24 176 lb 9.6 oz (80.1 kg)  02/10/24 179 lb (81.2 kg)  02/06/24 174 lb 9.6 oz (79.2 kg)   SpO2 Readings from Last 3 Encounters:  02/13/24 94%  02/10/24 98%  02/06/24 97%      Physical Exam Vitals and nursing note reviewed.  Constitutional:      Appearance: Normal  appearance.  HENT:     Right Ear: Ear canal and external ear normal.     Left Ear: Ear canal and external ear normal.     Ears:     Comments: Fluid behind bilateral TM L>R Cardiovascular:     Rate and Rhythm: Normal rate and regular rhythm.     Heart sounds: Normal heart sounds.  Pulmonary:     Effort: Pulmonary effort is normal.     Breath sounds: Wheezing and rhonchi present.     Comments: LLL and LUE Neurological:     Mental Status: She is alert.     No results found for any visits on 02/13/24.      Assessment & Plan:   Problem List Items Addressed This Visit       Respiratory   Lower respiratory infection - Primary   History of same.  Patient immunocompromise.  Just recently finished a course of Augmentin will add on doxycycline 100 mg twice daily for 7 days.  Offered to do chest x-ray in office patient politely declined      Relevant Medications   doxycycline (VIBRA-TABS) 100 MG tablet   fluconazole (DIFLUCAN) 150 MG tablet     Other   Cough   Patient can use over-the-counter dextromethorphan as needed along with  codeine cough syrup at bedtime.  Will start patient on Advair.  Patient to continue albuterol inhaler as needed.  She does have an appoint with pulmonology keep pulmonology appointment scheduled      Relevant Medications   fluticasone-salmeterol (ADVAIR) 100-50 MCG/ACT AEPB   Antibiotic-induced yeast infection   History of same Diflucan 100 mg x 1 with 1 refill.      Relevant Medications   fluconazole (DIFLUCAN) 150 MG tablet    Meds ordered this encounter  Medications   fluticasone-salmeterol (ADVAIR) 100-50 MCG/ACT AEPB    Sig: Inhale 1 puff into the lungs 2 (two) times daily.    Dispense:  60 each    Refill:  1    Supervising Provider:   Roxy Manns A [[id number]]   doxycycline (VIBRA-TABS) 100 MG tablet    Sig: Take 1 tablet (100 mg total) by mouth 2 (two) times daily for 7 days.    Dispense:  14 tablet    Refill:  0    Supervising Provider:    Roxy Manns A [[id number]]   fluconazole (DIFLUCAN) 150 MG tablet    Sig: Take 1 tablet (150 mg total) by mouth once for 1 dose.    Dispense:  1 tablet    Refill:  1    Supervising Provider:   Roxy Manns A [[id number]]    Return if symptoms worsen or fail to improve.  Audria Nine, NP

## 2024-02-13 NOTE — Assessment & Plan Note (Signed)
History of same.  Patient immunocompromise.  Just recently finished a course of Augmentin will add on doxycycline 100 mg twice daily for 7 days.  Offered to do chest x-ray in office patient politely declined

## 2024-02-13 NOTE — Assessment & Plan Note (Signed)
History of same Diflucan 100 mg x 1 with 1 refill.

## 2024-02-13 NOTE — Assessment & Plan Note (Signed)
Patient can use over-the-counter dextromethorphan as needed along with codeine cough syrup at bedtime.  Will start patient on Advair.  Patient to continue albuterol inhaler as needed.  She does have an appoint with pulmonology keep pulmonology appointment scheduled

## 2024-02-16 MED FILL — Cyclobenzaprine HCl Tab 10 MG: ORAL | 30 days supply | Qty: 30 | Fill #1 | Status: AC

## 2024-02-16 MED FILL — Linaclotide Cap 290 MCG: ORAL | 90 days supply | Qty: 90 | Fill #2 | Status: AC

## 2024-02-17 ENCOUNTER — Other Ambulatory Visit: Payer: Self-pay

## 2024-02-18 ENCOUNTER — Ambulatory Visit: Payer: Commercial Managed Care - PPO | Admitting: Pulmonary Disease

## 2024-02-18 ENCOUNTER — Other Ambulatory Visit: Payer: Self-pay

## 2024-02-18 ENCOUNTER — Encounter: Payer: Self-pay | Admitting: Pharmacist

## 2024-02-18 ENCOUNTER — Encounter: Payer: Self-pay | Admitting: Pulmonary Disease

## 2024-02-18 VITALS — BP 138/60 | HR 77 | Temp 97.6°F | Ht 60.0 in | Wt 179.8 lb

## 2024-02-18 DIAGNOSIS — J22 Unspecified acute lower respiratory infection: Secondary | ICD-10-CM

## 2024-02-18 DIAGNOSIS — J9801 Acute bronchospasm: Secondary | ICD-10-CM | POA: Diagnosis not present

## 2024-02-18 DIAGNOSIS — R0602 Shortness of breath: Secondary | ICD-10-CM | POA: Diagnosis not present

## 2024-02-18 DIAGNOSIS — K219 Gastro-esophageal reflux disease without esophagitis: Secondary | ICD-10-CM | POA: Diagnosis not present

## 2024-02-18 LAB — NITRIC OXIDE: Nitric Oxide: 13

## 2024-02-18 MED ORDER — AIRSUPRA 90-80 MCG/ACT IN AERO
2.0000 | INHALATION_SPRAY | Freq: Four times a day (QID) | RESPIRATORY_TRACT | 3 refills | Status: AC
  Filled 2024-02-18: qty 10.7, 30d supply, fill #0
  Filled 2024-05-27: qty 10.7, 30d supply, fill #1
  Filled 2024-07-02 – 2024-07-17 (×3): qty 10.7, 30d supply, fill #2

## 2024-02-18 NOTE — Patient Instructions (Addendum)
VISIT SUMMARY:  [name], during your visit today, we discussed your recurrent lower respiratory infections, which have been troubling you for the past year. We also reviewed your type 1 diabetes, GERD, and your history of heart surgery. We talked about your recent treatments and the impact of steroids on your blood sugar levels. We have a plan to help manage your symptoms and improve your overall health.  YOUR PLAN:  -RECURRENT LOWER RESPIRATORY INFECTIONS: Recurrent lower respiratory infections mean you frequently get infections in your lungs, often after minor colds. We suspect airway inflammation, possibly asthma, and have prescribed an Airsupra inhaler to help manage this. You may experience some initial coughing, and it's important to rinse the mouthpiece weekly to prevent powder buildup. We will also conduct pulmonary function tests to further evaluate your condition.  -DIABETES MELLITUS: Diabetes is a condition where your body does not produce insulin, making it difficult to control blood sugar levels. Steroids used to treat your lung infections can destabilize your blood sugar, so we need to monitor this closely.  -GASTROESOPHAGEAL REFLUX DISEASE (GERD): GERD is a condition where stomach acid frequently flows back into the tube connecting your mouth and stomach. Your symptoms have improved with medication and by stopping meat consumption.  -POST-OPERATIVE STATUS: CORONARY ARTERY BYPASS GRAFT (CABG): You had a triple bypass surgery in 2010 to improve blood flow to your heart. You are currently under the care of your cardiologist, Dr. Excell Seltzer.  -GENERAL HEALTH MAINTENANCE: You have a shellfish allergy and a history of frequent sinus infections. There is no family history of asthma or lung problems. Continue with your current lifestyle changes and medications.  INSTRUCTIONS:  Please schedule a follow-up appointment in two months. We will also conduct pulmonary function tests to further evaluate  your lung condition. If your symptoms worsen, call us immediately.

## 2024-02-18 NOTE — Progress Notes (Unsigned)
Subjective:    Patient ID: [name], female    DOB: 05-04-1966, 58 y.o.   MRN: [id number]  Patient Care Team: Eden Emms, NP as PCP - General (Nurse Practitioner) Tonny Bollman, MD as PCP - Cardiology (Cardiology) Warden Fillers, MD Levert Feinstein, MD as Consulting Physician (Neurology) Levert Feinstein, MD as Referring Physician (Neurology) Creig Hines, MD as Consulting Physician (Hematology and Oncology)  Chief Complaint  Patient presents with  . Consult    Recurrent cough, and wheezing x 1 year.     BACKGROUND:   HPI    Review of Systems A 10 point review of systems was performed and it is as noted above otherwise negative.   Past Medical History:  Diagnosis Date  . ACUT MI ANTEROLAT WALL SUBSQT EPIS CARE   . Acute maxillary sinusitis   . ALLERGIC RHINITIS, SEASONAL   . Anemia   . Arthritis   . ARTHRITIS, RHEUMATOID    shoulders and hands Enbrel>leg swelling Dr. Corliss Skains  . Atrial fibrillation (HCC)    a. after CABG.  . Back pain   . Bruxism (teeth grinding)   . CAD, ARTERY BYPASS GRAFT    a. DES to RCA in 2010 then LAD occlusion s/p CABG 3 06/07/2009 with LIMA to LAD, reverse SVG to D1, reverse SVG to distal RCA. b. Cath 05/08/2016 slightly hypodense region in the intermediate branch, however she had excellent flow, FFR was normal. Vein graft to PDA and the posterolateral branch is patent, patent LIMA to LAD, occluded SVG to diagonal.  . CARPAL TUNNEL SYNDROME, BILATERAL   . CHOLELITHIASIS   . Contrast media allergy   . DERMATITIS, ALLERGIC   . DIABETES MELLITUS, TYPE I    on insulin pump dx'ed age 64 y.o   . DIABETIC  RETINOPATHY   . Graves' disease   . Heart attack (HCC)   . Hiatal hernia   . HYPERLIPIDEMIA-MIXED   . Hypertension   . HYPERTHYROIDISM    Dr. Osborne Casco  . IBS (irritable bowel syndrome)   . IDA (iron deficiency anemia)   . Infertility, female   . Insulin pump in place   . Lymphadenopathy of head and neck   . MIGRAINE W/O AURA  W/INTRACT W/STATUS MIGRAINOSUS 02/19/2008  . PONV (postoperative nausea and vomiting)   . Psoriasis   . Rheumatoid arthritis (HCC)   . SINUS TACHYCARDIA 11/08/2010  . Sleep apnea    not using machine yet   . Stomach ulcer   . SVT (supraventricular tachycardia) (HCC)    after s/p CABG  . Tendonitis   . TRIGGER FINGER    all fingers b/l hands   . URI     Past Surgical History:  Procedure Laterality Date  . ABDOMINAL HYSTERECTOMY     endometriomas b/l; total ? cervix removed; no h/o abnormal pap  . Caesarean section    . CARDIAC CATHETERIZATION N/A 05/08/2016   Procedure: Left Heart Cath and Cors/Grafts Angiography;  Surgeon: Kathleene Hazel, MD;  Location: [facility] INVASIVE CV LAB;  Service: Cardiovascular;  Laterality: N/A;  . CARPAL TUNNEL RELEASE     b/l   . CARPAL TUNNEL RELEASE Left 06/06/2021   Procedure: CARPAL TUNNEL RELEASE;  Surgeon: Cindee Salt, MD;  Location: [facility];  Service: Orthopedics;  Laterality: Left;  AXILLARY BLOCK  . CATARACT EXTRACTION, BILATERAL    . CHOLECYSTECTOMY    . COLONOSCOPY WITH PROPOFOL N/A 03/25/2020   Procedure: COLONOSCOPY WITH PROPOFOL;  Surgeon: Wyline Mood,  MD;  Location: [facility] ENDOSCOPY;  Service: Gastroenterology;  Laterality: N/A;  . CORONARY ARTERY BYPASS GRAFT    . ESOPHAGOGASTRODUODENOSCOPY (EGD) WITH PROPOFOL N/A 03/25/2020   Procedure: ESOPHAGOGASTRODUODENOSCOPY (EGD) WITH PROPOFOL;  Surgeon: Wyline Mood, MD;  Location: [facility] ENDOSCOPY;  Service: Gastroenterology;  Laterality: N/A;  . EYE SURGERY     laser x 2 for retinopathy   . LEFT HEART CATH AND CORS/GRAFTS ANGIOGRAPHY N/A 02/22/2020   Procedure: LEFT HEART CATH AND CORS/GRAFTS ANGIOGRAPHY;  Surgeon: Kathleene Hazel, MD;  Location: [facility] INVASIVE CV LAB;  Service: Cardiovascular;  Laterality: N/A;  . TRIGGER FINGER RELEASE     b/l fingers all   . ULNAR NERVE TRANSPOSITION Left 06/06/2021   Procedure: ULNAR NERVE DECOMPRESSION;  Surgeon: Cindee Salt, MD;   Location:  SURGERY CENTER;  Service: Orthopedics;  Laterality: Left;  AXILLARY BLOCK  . VITRECTOMY     b/l     Patient Active Problem List   Diagnosis Date Noted  . Antibiotic-induced yeast infection 02/13/2024  . Acute bronchitis 12/23/2023  . Abnormal CBC 12/12/2023  . H/O Graves' disease 12/11/2023  . Mixed diabetic hyperlipidemia associated with type 1 diabetes mellitus (HCC) 12/11/2023  . Dysfunction of both eustachian tubes 11/01/2023  . Lower respiratory infection 08/30/2023  . Lower extremity edema 06/12/2023  . Myofascial pain dysfunction syndrome 03/22/2023  . Left foot pain 12/05/2022  . Urinary urgency 10/23/2022  . Pyelonephritis 10/23/2022  . Premature atrial contractions 05/15/2022  . PVC's (premature ventricular contractions) 05/15/2022  . Low hemoglobin 01/16/2022  . Otalgia of right ear 01/09/2022  . Sore throat 01/09/2022  . OSA on CPAP 08/02/2021  . Abnormal MRI, lumbar spine 03/20/2021  . Abnormal MRI, thoracic spine 03/20/2021  . Chronic mid back pain 03/17/2021  . Chronic midline low back pain with left-sided sciatica 03/17/2021  . Type 1 diabetes mellitus with diabetic polyneuropathy (HCC) 02/17/2021  . Severe obstructive sleep apnea-hypopnea syndrome 02/17/2021  . Chronic diastolic CHF (congestive heart failure) (HCC) 12/19/2020  . Chronic back pain 12/19/2020  . Chronic abdominal pain 12/19/2020  . Acute diastolic heart failure (HCC) 12/09/2020  . Diabetic retinopathy associated with type 1 diabetes mellitus (HCC) 09/27/2020  . Diabetic retinopathy (HCC) 09/27/2020  . Graves' disease 09/27/2020  . Chronic coronary artery disease 09/27/2020  . Hypertension associated with diabetes (HCC) 09/14/2020  . Class 1 obesity 09/14/2020  . Preventative health care 09/14/2020  . Aortic atherosclerosis (HCC) 09/14/2020  . Snoring 05/12/2020  . Lung nodule 03/10/2020  . Colitis 03/10/2020  . Type 1 diabetes mellitus with proliferative retinopathy  (HCC) 12/10/2019  . Abnormal thyroid function test 12/10/2019  . Bruxism (teeth grinding)   . Migraine 10/07/2019  . Contusion of left hip 07/25/2019  . H/O multiple allergies 03/10/2019  . Hx of anaphylaxis 03/10/2019  . Cough 06/26/2018  . PND (post-nasal drip) 06/26/2018  . Lumbar strain, initial encounter 04/10/2018  . Thoracic myofascial strain 04/10/2018  . Vitamin D deficiency 07/04/2017  . B12 deficiency 07/04/2017  . Abnormal CT scan 06/19/2017  . Gastroesophageal reflux disease 06/19/2017  . Antiplatelet or antithrombotic long-term use 06/19/2017  . Nasal congestion 03/15/2017  . Graves disease 02/01/2017  . Sleep difficulties 02/01/2017  . No energy 02/01/2017  . Osteopenia 02/01/2017  . Bilateral hand pain 07/16/2016  . Acquired trigger finger 07/16/2016  . Hypertension, essential 05/08/2016  . Right foot pain 08/30/2014  . Shoulder pain, right 08/30/2014  . Chronic pain syndrome 01/15/2014  . Multiple joint pain 01/15/2014  .  Lesion of lower eyelid 04/21/2013  . Generalized constriction of visual field 03/31/2013  . Neoplasm of uncertain behavior of skin of eyelid 03/31/2013  . Posterior capsular opacification 03/31/2013  . Cyst of left lower eyelid 03/30/2013  . Pseudophakia of both eyes 03/30/2013  . Prurigo nodularis 02/05/2013  . Stasis dermatitis 02/05/2013  . Psoriasis 01/21/2013  . Trochanteric bursitis 11/10/2012  . Achilles tendinitis 07/11/2012  . Epiretinal membrane 06/17/2012  . Status post cataract extraction 04/30/2012  . Pes equinus, acquired 04/23/2012  . Tendinitis of ankle 04/23/2012  . Proliferative diabetic retinopathy of both eyes (HCC) 01/11/2012  . Ongoing use of possibly toxic medication 12/05/2011  . Hyperthyroidism 11/09/2010  . SINUS TACHYCARDIA 11/08/2010  . Shortness of breath 11/08/2010  . Acute non-recurrent maxillary sinusitis 10/16/2010  . DERMATITIS, ALLERGIC 07/20/2010  . EDEMA 07/12/2010  . Dizziness 11/14/2009  .  ATRIAL FIBRILLATION 07/20/2009  . Hx of CABG 06/07/2009  . ANGINA, STABLE/EXERTIONAL 06/02/2009  . Dyslipidemia, goal LDL below 70 04/26/2009  . ACUT MI ANTEROLAT WALL SUBSQT EPIS CARE 04/26/2009  . Chest pain 04/26/2009  . DIABETIC  RETINOPATHY 04/25/2009  . CARPAL TUNNEL SYNDROME, BILATERAL 04/25/2009  . TRIGGER FINGER 04/25/2009  . MIGRAINE W/O AURA W/INTRACT W/STATUS MIGRAINOSUS 02/19/2008  . URI with cough and congestion 01/24/2007  . Type 1 diabetes mellitus with complications (HCC) 10/16/2006  . ALLERGIC RHINITIS, SEASONAL 10/16/2006  . CHOLELITHIASIS 10/16/2006  . Rheumatoid arthritis (HCC) 10/16/2006    Family History  Problem Relation Age of Onset  . Depression Mother   . Anxiety disorder Mother   . Hypertension Mother   . Hyperlipidemia Mother   . Thyroid disease Mother   . Bipolar disorder Mother   . Sleep apnea Mother   . Eating disorder Mother   . Colon polyps Father   . Diabetes Father        2  . Hypertension Father   . Parkinson's disease Father   . Hyperlipidemia Father   . Depression Father   . Sleep apnea Father   . Obesity Father   . Stroke Paternal Grandmother   . Heart disease Other   . Hypertension Other   . Hyperlipidemia Other   . Depression Other   . Migraines Other   . Heart attack Neg Hx   . Colon cancer Neg Hx   . Stomach cancer Neg Hx   . Esophageal cancer Neg Hx     Social History   Tobacco Use  . Smoking status: Never    Passive exposure: Never  . Smokeless tobacco: Never  Substance Use Topics  . Alcohol use: Yes    Comment: rarely 1 every 6 months    Allergies  Allergen Reactions  . Actemra [Tocilizumab]   . Ramipril Swelling, Rash and Other (See Comments)  . Shellfish-Derived Products Swelling    Shrimp   . Atorvastatin Rash    Elevated LFT's  . Compazine  [Prochlorperazine Edisylate] Other (See Comments)    Neurological reaction  . Emgality [Galcanezumab-Gnlm] Hives and Swelling  . Etanercept Swelling and Rash   . Infliximab Rash  . Iohexol     Iv contrast dye -rash all over  . Orencia [Abatacept] Rash  . Prochlorperazine Edisylate     unknown  . Tofacitinib Rash and Other (See Comments)    Severe abdominal pain   . [employer]]     [employer], memory issues, word finding issues  . Shrimp Extract Other (See Comments)  . Topiramate Other (See  Comments)  . Tramadol     Nausea   . Amiodarone Nausea Only  . Rituximab Rash    Causes a rash    Current Meds  Medication Sig  . acetaminophen-codeine (TYLENOL #3) 300-30 MG tablet Take 1 tablet by mouth 2 (two) times daily as needed for moderate pain (pain score 4-6).  Marland Kitchen albuterol (VENTOLIN HFA) 108 (90 Base) MCG/ACT inhaler Inhale 2 puffs into the lungs every 4 (four) hours as needed.  . Ascorbic Acid 500 MG CAPS Take by mouth.  Marland Kitchen aspirin EC 81 MG tablet   . Blood Glucose Monitoring Suppl (FREESTYLE FREEDOM LITE) w/Device KIT Use as directed by physician to check blood sugar  . CHELATED MAGNESIUM PO Take 250 mg by mouth daily.  . Cholecalciferol (VITAMIN D3) 125 MCG (5000 UT) CAPS Decrease to 1 tab every other day  . clopidogrel (PLAVIX) 75 MG tablet TAKE 1 TABLET BY MOUTH ONCE DAILY  . Cyanocobalamin (VITAMIN B-12 PO) Take by mouth.  . cyclobenzaprine (FLEXERIL) 10 MG tablet Take 1 tablet (10 mg total) by mouth at bedtime as needed for muscle spasms.  Marland Kitchen doxycycline (VIBRA-TABS) 100 MG tablet Take 1 tablet (100 mg total) by mouth 2 (two) times daily for 7 days.  Marland Kitchen EPINEPHrine 0.3 mg/0.3 mL IJ SOAJ injection Inject 0.3 mg into the muscle as needed for anaphylaxis.  . fexofenadine (ALLEGRA) 180 MG tablet Take 1 tablet (180 mg total) by mouth daily.  . fluticasone (FLONASE) 50 MCG/ACT nasal spray Place 2 sprays into both nostrils daily.  . fluticasone-salmeterol (ADVAIR) 100-50 MCG/ACT AEPB Inhale 1 puff into the lungs 2 (two) times daily.  Marland Kitchen glucose blood (FREESTYLE LITE) test strip Use to check blood sugar 3 time(s) daily  .  glucose blood test strip   . guaiFENesin-codeine 100-10 MG/5ML syrup Take 5 mLs by mouth at bedtime.  . insulin detemir (LEVEMIR) 100 UNIT/ML injection Inject 36 units into the skin once daily in the event of insulin pump failure.  . insulin lispro (HUMALOG) 100 UNIT/ML injection Inject 0.9-1.2 mLs (90-120 Units total) into the skin daily.  . Insulin Syringe-Needle U-100 (BD INSULIN SYRINGE U/F) 31G X 5/16" 0.3 ML MISC use with insulin injections 3 times daily in the event of insulin pump failure.  Marland Kitchen ipratropium (ATROVENT) 0.06 % nasal spray Place 2 sprays into both nostrils 4 (four) times daily.  . isosorbide mononitrate (IMDUR) 60 MG 24 hr tablet Take 1 tablet (60 mg total) by mouth daily.  Boris Lown Oil 500 MG CAPS Take 500 mg by mouth daily.  . Lancets (FREESTYLE) lancets Use to check blood sugar 3 time(s) daily.  Marland Kitchen leflunomide (ARAVA) 20 MG tablet Take 1 tablet (20 mg total) by mouth daily.  Marland Kitchen linaclotide (LINZESS) 290 MCG CAPS capsule Take 1 capsule (290 mcg total) by mouth daily before breakfast.  . losartan (COZAAR) 100 MG tablet Take 1 tablet (100 mg total) by mouth daily.  . metoprolol tartrate (LOPRESSOR) 50 MG tablet Take 0.5 tablets (25 mg total) by mouth 2 (two) times daily.  . montelukast (SINGULAIR) 10 MG tablet Take 1 tablet (10 mg total) by mouth at bedtime.  . Multiple Vitamin (MULTIVITAMIN) capsule Take 1 capsule by mouth daily.  . ondansetron (ZOFRAN) 4 MG tablet Take 1 tablet (4 mg total) by mouth every 8 (eight) hours as needed for nausea or vomiting.  . pantoprazole (PROTONIX) 40 MG tablet Take 1 tablet (40 mg total) by mouth 2 (two) times daily.  . potassium chloride SA (KLOR-CON  M20) 20 MEQ tablet Take 1 tablet (20 mEq total) by mouth daily.  . rosuvastatin (CRESTOR) 20 MG tablet Take 1 tablet (20 mg total) by mouth daily.  Marland Kitchen torsemide (DEMADEX) 20 MG tablet Take 1 tablet (20 mg total) by mouth daily.  . Turmeric (QC TUMERIC COMPLEX PO) Take 1,000 mg by mouth.  Marland Kitchen  Upadacitinib ER (RINVOQ) 15 MG TB24 Take 1 tablet (15 mg) by mouth daily.   Current Facility-Administered Medications for the 02/18/24 encounter (Office Visit) with Salena Saner, MD  Medication  . lidocaine (XYLOCAINE) 1 % (with pres) injection 6 mL  . lidocaine (XYLOCAINE) 1 % (with pres) injection 6 mL    Immunization History  Administered Date(s) Administered  . Influenza Inj Mdck Quad With Preservative 09/30/2014  . Influenza, Quadrivalent, Recombinant, Inj, Pf 10/14/2013  . Influenza, Seasonal, Injecte, Preservative Fre 09/19/2016  . Influenza,inj,Quad PF,6+ Mos 09/30/2014, 09/23/2017, 09/03/2018, 10/02/2022  . Influenza,inj,quad, With Preservative 09/23/2017  . Influenza-Unspecified 09/30/2014, 09/30/2016, 09/03/2018, 09/10/2019, 09/28/2020, 09/28/2021, 10/03/2023  . PFIZER(Purple Top)SARS-COV-2 Vaccination 12/28/2019, 01/18/2020  . Pneumococcal Conjugate-13 07/16/2016  . Pneumococcal Polysaccharide-23 01/01/2004, 10/15/2006, 01/04/2020  . Td 10/01/2003  . Tdap 01/09/2011, 06/25/2018  . Zoster Recombinant(Shingrix) 09/14/2020, 12/15/2020        Objective:     BP 138/60 (BP Location: Left Arm, Patient Position: Sitting, Cuff Size: Normal)   Pulse 77   Temp 97.6 F (36.4 C) (Temporal)   Ht 5' (1.524 m)   Wt 179 lb 12.8 oz (81.6 kg)   SpO2 99%   BMI 35.11 kg/m   SpO2: 99 %  GENERAL: HEAD: Normocephalic, atraumatic.  EYES: Pupils equal, round, reactive to light.  No scleral icterus.  MOUTH:  NECK: Supple. No thyromegaly. Trachea midline. No JVD.  No adenopathy. PULMONARY: Good air entry bilaterally.  No adventitious sounds. CARDIOVASCULAR: S1 and S2. Regular rate and rhythm.  ABDOMEN: MUSCULOSKELETAL: No joint deformity, no clubbing, no edema.  NEUROLOGIC:  SKIN: Intact,warm,dry. PSYCH:        Assessment & Plan:     ICD-10-CM   1. Shortness of breath  R06.02 Nitric oxide      Orders Placed This Encounter  Procedures  . Nitric oxide    No  orders of the defined types were placed in this encounter.    Advised if symptoms do not improve or worsen, to please contact office for sooner follow up or seek emergency care.    I spent xxx minutes of dedicated to the care of this patient on the date of this encounter to include pre-visit review of records, face-to-face time with the patient discussing conditions above, post visit ordering of testing, clinical documentation with the electronic health record, making appropriate referrals as documented, and communicating necessary findings to members of the patients care team.   C. Danice Goltz, MD Advanced Bronchoscopy PCCM [facility]    *This note was dictated using voice recognition software/Dragon.  Despite best efforts to proofread, errors can occur which can change the meaning. Any transcriptional errors that result from this process are unintentional and may not be fully corrected at the time of dictation.

## 2024-02-19 ENCOUNTER — Ambulatory Visit (INDEPENDENT_AMBULATORY_CARE_PROVIDER_SITE_OTHER): Payer: Commercial Managed Care - PPO | Admitting: Family Medicine

## 2024-02-21 ENCOUNTER — Other Ambulatory Visit: Payer: Self-pay | Admitting: Physician Assistant

## 2024-02-21 ENCOUNTER — Other Ambulatory Visit (HOSPITAL_COMMUNITY): Payer: Self-pay

## 2024-02-21 ENCOUNTER — Other Ambulatory Visit: Payer: Self-pay

## 2024-02-21 DIAGNOSIS — M0609 Rheumatoid arthritis without rheumatoid factor, multiple sites: Secondary | ICD-10-CM

## 2024-02-21 MED ORDER — RINVOQ 15 MG PO TB24
15.0000 mg | ORAL_TABLET | Freq: Every day | ORAL | 0 refills | Status: DC
  Filled 2024-02-21: qty 30, 30d supply, fill #0
  Filled 2024-03-26 (×2): qty 30, 30d supply, fill #1
  Filled 2024-04-16 (×2): qty 30, 30d supply, fill #2

## 2024-02-21 NOTE — Telephone Encounter (Signed)
Last Fill: 11/21/2023  Labs: 12/12/2023 CBC: WBC 3.7, RBC 3.67, hemoglobin 11.2, HCT 32.1 11/26/2023 CMP   TB Gold: 10/15/2023 Neg ([facility] tab)  Next Visit: 05/20/2024  Last Visit: 12/10/2023  DX: Rheumatoid arthritis of multiple sites with negative rheumatoid factor   Current Dose per office note on 12/10/2023: Rinvoq 15 mg 1 tablet by mouth once daily   Okay to refill Rinvoq?

## 2024-02-21 NOTE — Progress Notes (Signed)
Specialty Pharmacy Refill Coordination Note  [name] is a 58 y.o. female contacted today regarding refills of specialty medication(s) Upadacitinib (Rinvoq)   Patient requested Delivery   Delivery date: 03/02/24   Verified address: [address] [name]   Medication will be filled on 02.28.25.   This fill date is pending response to refill request from provider. Patient is aware and if they have not received fill by intended date they must follow up with pharmacy.

## 2024-02-24 DIAGNOSIS — H53483 Generalized contraction of visual field, bilateral: Secondary | ICD-10-CM | POA: Diagnosis not present

## 2024-02-25 ENCOUNTER — Other Ambulatory Visit: Payer: Self-pay | Admitting: Nurse Practitioner

## 2024-02-25 DIAGNOSIS — R052 Subacute cough: Secondary | ICD-10-CM

## 2024-02-26 ENCOUNTER — Other Ambulatory Visit: Payer: Self-pay

## 2024-02-26 MED ORDER — MONTELUKAST SODIUM 10 MG PO TABS
10.0000 mg | ORAL_TABLET | Freq: Every day | ORAL | 3 refills | Status: AC
  Filled 2024-02-26: qty 90, 90d supply, fill #0
  Filled 2024-05-27: qty 90, 90d supply, fill #1
  Filled 2024-08-21: qty 90, 90d supply, fill #2
  Filled 2024-11-19: qty 90, 90d supply, fill #3

## 2024-02-28 ENCOUNTER — Other Ambulatory Visit: Payer: Self-pay | Admitting: Nurse Practitioner

## 2024-02-28 ENCOUNTER — Other Ambulatory Visit: Payer: Self-pay

## 2024-02-28 ENCOUNTER — Other Ambulatory Visit: Payer: Self-pay | Admitting: Cardiovascular Disease

## 2024-03-01 ENCOUNTER — Other Ambulatory Visit: Payer: Self-pay

## 2024-03-02 ENCOUNTER — Other Ambulatory Visit: Payer: Self-pay

## 2024-03-02 MED FILL — Metoprolol Tartrate Tab 50 MG: ORAL | 90 days supply | Qty: 90 | Fill #0 | Status: AC

## 2024-03-02 MED FILL — Torsemide Tab 20 MG: ORAL | 90 days supply | Qty: 90 | Fill #0 | Status: AC

## 2024-03-03 ENCOUNTER — Institutional Professional Consult (permissible substitution): Payer: Commercial Managed Care - PPO | Admitting: Internal Medicine

## 2024-03-03 ENCOUNTER — Ambulatory Visit
Admission: RE | Admit: 2024-03-03 | Discharge: 2024-03-03 | Disposition: A | Discharge status: Home / Self Care | Payer: Commercial Managed Care - PPO | Source: Ambulatory Visit | Attending: Nurse Practitioner | Admitting: Nurse Practitioner

## 2024-03-03 DIAGNOSIS — Z1231 Encounter for screening mammogram for malignant neoplasm of breast: Secondary | ICD-10-CM | POA: Diagnosis not present

## 2024-03-03 DIAGNOSIS — M8580 Other specified disorders of bone density and structure, unspecified site: Secondary | ICD-10-CM | POA: Diagnosis not present

## 2024-03-05 ENCOUNTER — Encounter: Payer: Self-pay | Admitting: Nurse Practitioner

## 2024-03-06 ENCOUNTER — Telehealth: Payer: Self-pay | Admitting: Physical Medicine and Rehabilitation

## 2024-03-06 NOTE — Telephone Encounter (Signed)
 Patient would like for someone to call her back about side effects from Tylenol 3.   Her phone number is [phone].

## 2024-03-06 NOTE — Telephone Encounter (Signed)
 Pt reports that gets mild itching with Tylenol #3-  And some nausea- but also gets these Sx's with oxy and itching makes her feel like wants to take her skin off with Oxy, so better than that.  We discussed itching is side effect vs allergic reaction usually with opiates and shouldn't get worse.  Also discussed cutting in half to help with pain and get less side effects which I'm fine with- will try and let me know at next appt- will try this for awhile Has an antinausea medicine.

## 2024-03-09 ENCOUNTER — Ambulatory Visit: Payer: Commercial Managed Care - PPO | Admitting: Physical Medicine and Rehabilitation

## 2024-03-09 ENCOUNTER — Encounter: Payer: Self-pay | Admitting: Nurse Practitioner

## 2024-03-12 ENCOUNTER — Other Ambulatory Visit: Payer: Self-pay | Admitting: Cardiovascular Disease

## 2024-03-12 MED FILL — Cyclobenzaprine HCl Tab 10 MG: ORAL | 30 days supply | Qty: 30 | Fill #2 | Status: AC

## 2024-03-13 ENCOUNTER — Other Ambulatory Visit: Payer: Self-pay

## 2024-03-13 MED ORDER — ROSUVASTATIN CALCIUM 20 MG PO TABS
20.0000 mg | ORAL_TABLET | Freq: Every day | ORAL | 1 refills | Status: DC
  Filled 2024-03-13: qty 90, 90d supply, fill #0
  Filled 2024-06-10: qty 90, 90d supply, fill #1

## 2024-03-16 ENCOUNTER — Encounter: Payer: Commercial Managed Care - PPO | Admitting: Physical Medicine and Rehabilitation

## 2024-03-17 ENCOUNTER — Ambulatory Visit (INDEPENDENT_AMBULATORY_CARE_PROVIDER_SITE_OTHER): Payer: Commercial Managed Care - PPO | Admitting: Family Medicine

## 2024-03-17 ENCOUNTER — Encounter (INDEPENDENT_AMBULATORY_CARE_PROVIDER_SITE_OTHER): Payer: Self-pay | Admitting: Family Medicine

## 2024-03-17 VITALS — BP 147/75 | HR 79 | Temp 97.4°F | Ht 61.25 in | Wt 171.0 lb

## 2024-03-17 DIAGNOSIS — E559 Vitamin D deficiency, unspecified: Secondary | ICD-10-CM

## 2024-03-17 DIAGNOSIS — I152 Hypertension secondary to endocrine disorders: Secondary | ICD-10-CM | POA: Diagnosis not present

## 2024-03-17 DIAGNOSIS — E669 Obesity, unspecified: Secondary | ICD-10-CM | POA: Diagnosis not present

## 2024-03-17 DIAGNOSIS — E1059 Type 1 diabetes mellitus with other circulatory complications: Secondary | ICD-10-CM

## 2024-03-17 DIAGNOSIS — Z6832 Body mass index (BMI) 32.0-32.9, adult: Secondary | ICD-10-CM | POA: Diagnosis not present

## 2024-03-17 DIAGNOSIS — M818 Other osteoporosis without current pathological fracture: Secondary | ICD-10-CM

## 2024-03-17 DIAGNOSIS — E108 Type 1 diabetes mellitus with unspecified complications: Secondary | ICD-10-CM

## 2024-03-17 NOTE — Progress Notes (Signed)
 [name], D.O.  ABFM, ABOM Specializing in Clinical Bariatric Medicine  Office located at: [address] [place], [place]  [address]   Assessment and Plan:   FOR THE DISEASE OF OBESITY:  BMI 32.0-32.9,adult -- Current BMI 32.04 Obesity (BMI 30-39.9), Starting BMI 11/26/23 32.02 Assessment & Plan: Since last office visit on 01/22/2024 patient's  Muscle mass has decreased by 1.6 lb. Fat mass has increased by 0.6 lb. Total body water has decreased by 1.8 lb.  Counseling done on how various foods will affect these numbers and how to maximize success  Total lbs lost to date: 9 lbs  Total weight loss percentage to date: 3.93%    Recommended Dietary Goals Noemi is currently in the action stage of change. As such, her goal is to continue weight management plan.  She has agreed to: continue current plan   Behavioral Intervention We discussed the following today: increasing protein intake.  Additional resources provided today: None  Evidence-based interventions for health behavior change were utilized today including the discussion of self monitoring techniques, problem-solving barriers and SMART goal setting techniques.   Regarding patient's less desirable eating habits and patterns, we employed the technique of small changes.   Pt will specifically work on: n/a   Recommended Physical Activity Goals Saranda has been advised to work up to 150 minutes of moderate intensity aerobic activity a week and strengthening exercises 2-3 times per week for cardiovascular health, weight loss maintenance and preservation of muscle mass.   She has agreed to : INCREASE walking to 30 minutes, 3 days a week and ADD 2 days a week of upper body strength training.    Pharmacotherapy We both agreed to : continue with nutritional and behavioral strategies   FOR ASSOCIATED CONDITIONS ADDRESSED TODAY:   Type 1 diabetes mellitus with complications [facility]) Assessment & Plan: Managed by  Dr.Gherghe of endocrinology. HbA1c of 6.0 on 01/27/2024. States that she was having low blood sugars at night about a month ago. Her endocrinologist decreased her evening insulin dose and this has improved her blood sugars at night and she reports only having occasional lows.Continue treatment per Dr.Gerghe. Continue to decrease simple carbs/ sugars; increase fiber and proteins -> follow her meal plan.     Hypertension associated with diabetes (HCC) Assessment & Plan: Last 3 blood pressure readings in our office are as follows: BP Readings from Last 3 Encounters:  03/17/24 (!) 147/75  02/18/24 138/60  02/13/24 110/60   HTN treated with Demadex, Losartan, and Lopressor. Her BP is above target today - pt not sure why. She is asymptomatic. Continue adherence to all medications. Check BP at home 2-3 times per week. Increase exercise as able. Losing 5-10% or more of body weight may improve condition    Other osteoporosis without current pathological fracture Vitamin D deficiency Assessment & Plan: Recently diagnosed with osteoporosis. Bone density scan from 03/03/2024 showed T-Score of -3.8. Discussed the importance of weightbearing exercises and weight lifting exercises to prevent further deterioration of bone health. Continue OTC Vitamin D 5,000 units every other day. CHECK Vitamin D next OV. Encouraged pt to f/up with PCP regarding further treatment for her osteoporosis.    Follow up:   Return in about 29 days (around 04/15/2024). She was informed of the importance of frequent follow up visits to maximize her success with intensive lifestyle modifications for her multiple health conditions.  Subjective:   Chief complaint: Obesity Aleen is here to discuss her progress with her obesity  treatment plan. She is on the Vegetarian Plan and states she is following her eating plan approximately 75% of the time. She states she just started walking 20 minutes, 2 days a week.   Interval History:  VESPER TRANT is here for a follow up office visit. Since last OV on 01/22/2024, Tailor is down 1 lb. She has no complaints with her prudent nutritional plan. Hunger/cravings stable.   Pharmacotherapy for weight loss: She is currently taking no anti-obesity medication.   Review of Systems:  Pertinent positives were addressed with patient today.  Reviewed by clinician on day of visit: allergies, medications, problem list, medical history, surgical history, family history, social history, and previous encounter notes.  Weight Summary and Biometrics   Weight Lost Since Last Visit: 3lb  Weight Gained Since Last Visit: 0   Vitals Temp: (!) 97.4 F (36.3 C) BP: (!) 147/75 Pulse Rate: 79 SpO2: 96 %   Anthropometric Measurements Height: 5' 1.25" (1.556 m) Weight: 171 lb (77.6 kg) BMI (Calculated): 32.04 Weight at Last Visit: 174lb Weight Lost Since Last Visit: 3lb Weight Gained Since Last Visit: 0 Starting Weight: 178lb Total Weight Loss (lbs): 9 lb (4.082 kg) Peak Weight: 190lb   Body Composition  Body Fat %: 45.3 % Fat Mass (lbs): 77.6 lbs Muscle Mass (lbs): 88.8 lbs Total Body Water (lbs): 70.4 lbs Visceral Fat Rating : 12   Other Clinical Data Fasting: no Labs: no Today's Visit #: 4 Starting Date: 11/26/23    Objective:   PHYSICAL EXAM: Blood pressure (!) 147/75, pulse 79, temperature (!) 97.4 F (36.3 C), height 5' 1.25" (1.556 m), weight 171 lb (77.6 kg), SpO2 96%. Body mass index is 32.05 kg/m.  General: she is overweight, cooperative and in no acute distress. PSYCH: Has normal mood, affect and thought process.   HEENT: EOMI, sclerae are anicteric. Lungs: Normal breathing effort, no conversational dyspnea. Extremities: Moves * 4 Neurologic: A and O * 3, good insight  DIAGNOSTIC DATA REVIEWED: BMET    Component Value Date/Time   NA 140 11/26/2023 1123   K 3.9 11/26/2023 1123   CL 103 11/26/2023 1123   CO2 24 11/26/2023 1123   GLUCOSE 91 11/26/2023 1123    GLUCOSE 159 (H) 09/12/2023 1548   BUN 10 11/26/2023 1123   CREATININE 0.75 11/26/2023 1123   CREATININE 0.80 09/12/2023 1548   CALCIUM 9.1 11/26/2023 1123   GFRNONAA >60 06/01/2021 0757   GFRNONAA 74 04/07/2021 0855   GFRAA 86 04/07/2021 0855   Lab Results  Component Value Date   HGBA1C 6.0 (A) 01/27/2024   HGBA1C 6.8 06/26/2007   Lab Results  Component Value Date   INSULIN <0.4 (L) 11/26/2023   Lab Results  Component Value Date   TSH 2.050 11/26/2023   CBC    Component Value Date/Time   WBC 3.7 (L) 12/12/2023 1610   RBC 3.67 (L) 12/12/2023 1610   HGB 11.2 (L) 12/12/2023 1610   HGB 11.1 (L) 09/18/2023 1447   HGB 13.4 02/15/2020 1656   HCT 32.1 (L) 12/12/2023 1610   HCT 38.9 02/15/2020 1656   PLT 225.0 12/12/2023 1610   PLT 188 09/18/2023 1447   PLT 172 02/15/2020 1656   MCV 87.5 12/12/2023 1610   MCV 87 02/15/2020 1656   MCH 29.9 09/18/2023 1447   MCHC 34.9 12/12/2023 1610   RDW 13.3 12/12/2023 1610   RDW 12.8 02/15/2020 1656   Iron Studies    Component Value Date/Time   IRON 83 09/18/2023 1447  TIBC 406 09/18/2023 1447   FERRITIN 58 09/18/2023 1447   IRONPCTSAT 20 09/18/2023 1447   Lipid Panel     Component Value Date/Time   CHOL 162 11/26/2023 1123   TRIG 96 11/26/2023 1123   HDL 76 11/26/2023 1123   CHOLHDL 2 12/05/2022 1558   VLDL 19.0 12/05/2022 1558   LDLCALC 69 11/26/2023 1123   Hepatic Function Panel     Component Value Date/Time   PROT 6.1 11/26/2023 1123   ALBUMIN 4.4 11/26/2023 1123   AST 24 11/26/2023 1123   ALT 15 11/26/2023 1123   ALKPHOS 76 11/26/2023 1123   BILITOT 0.3 11/26/2023 1123   BILIDIR 0.1 11/08/2010 1257   IBILI 0.4 07/05/2009 0743      Component Value Date/Time   TSH 2.050 11/26/2023 1123   Nutritional Lab Results  Component Value Date   VD25OH 92.9 11/26/2023   VD25OH 74.19 12/05/2022   VD25OH 80.30 05/15/2022    Attestations:   I, Special Puri, acting as a Stage manager for Thomasene Lot, DO.,  have compiled all relevant documentation for today's office visit on behalf of Thomasene Lot, DO, while in the presence of [employer], DO.  I have reviewed the above documentation for accuracy and completeness, and I agree with the above. [name], D.O.  The 21st Century Cures Act was signed into law in 2016 which includes the topic of electronic health records.  This provides immediate access to information in MyChart.  This includes consultation notes, operative notes, office notes, lab results and pathology reports.  If you have any questions about what you read please let us know at your next visit so we can discuss your concerns and take corrective action if need be.  We are right here with you.

## 2024-03-24 ENCOUNTER — Other Ambulatory Visit: Payer: Self-pay | Admitting: Physician Assistant

## 2024-03-24 ENCOUNTER — Other Ambulatory Visit (HOSPITAL_COMMUNITY): Payer: Self-pay

## 2024-03-24 ENCOUNTER — Other Ambulatory Visit: Payer: Self-pay | Admitting: Cardiovascular Disease

## 2024-03-24 ENCOUNTER — Other Ambulatory Visit: Payer: Self-pay

## 2024-03-24 DIAGNOSIS — Z79899 Other long term (current) drug therapy: Secondary | ICD-10-CM

## 2024-03-24 MED FILL — Leflunomide Tab 20 MG: ORAL | 90 days supply | Qty: 90 | Fill #0 | Status: AC

## 2024-03-24 NOTE — Telephone Encounter (Signed)
 Last Fill: 12/23/2023  Labs: 12/12/2023 WBC 3.7, RBC 3.67, Hgb 11.2, Hct 32.1, 11/26/2023 CMP WNL  Next Visit: 05/11/2024  Last Visit: 12/10/2023  DX:  Rheumatoid arthritis of multiple sites with negative rheumatoid factor   Current Dose per office note 12/10/2023: Arava 20 mg 1 tablet by mouth daily   Patient advised she is due to update her labs. Patient states she will come on Thursday to update.   Okay to refill Arava ?

## 2024-03-25 ENCOUNTER — Other Ambulatory Visit: Payer: Self-pay

## 2024-03-26 ENCOUNTER — Other Ambulatory Visit: Payer: Self-pay | Admitting: *Deleted

## 2024-03-26 ENCOUNTER — Other Ambulatory Visit: Payer: Self-pay

## 2024-03-26 ENCOUNTER — Other Ambulatory Visit (HOSPITAL_COMMUNITY): Payer: Self-pay

## 2024-03-26 DIAGNOSIS — H57813 Brow ptosis, bilateral: Secondary | ICD-10-CM | POA: Diagnosis not present

## 2024-03-26 DIAGNOSIS — H53483 Generalized contraction of visual field, bilateral: Secondary | ICD-10-CM | POA: Diagnosis not present

## 2024-03-26 DIAGNOSIS — H02422 Myogenic ptosis of left eyelid: Secondary | ICD-10-CM | POA: Diagnosis not present

## 2024-03-26 DIAGNOSIS — H02423 Myogenic ptosis of bilateral eyelids: Secondary | ICD-10-CM | POA: Diagnosis not present

## 2024-03-26 DIAGNOSIS — Z79899 Other long term (current) drug therapy: Secondary | ICD-10-CM

## 2024-03-26 DIAGNOSIS — H02834 Dermatochalasis of left upper eyelid: Secondary | ICD-10-CM | POA: Diagnosis not present

## 2024-03-26 DIAGNOSIS — H02831 Dermatochalasis of right upper eyelid: Secondary | ICD-10-CM | POA: Diagnosis not present

## 2024-03-26 DIAGNOSIS — H5702 Anisocoria: Secondary | ICD-10-CM | POA: Diagnosis not present

## 2024-03-26 DIAGNOSIS — Z01818 Encounter for other preprocedural examination: Secondary | ICD-10-CM | POA: Diagnosis not present

## 2024-03-26 DIAGNOSIS — H0279 Other degenerative disorders of eyelid and periocular area: Secondary | ICD-10-CM | POA: Diagnosis not present

## 2024-03-26 DIAGNOSIS — H02421 Myogenic ptosis of right eyelid: Secondary | ICD-10-CM | POA: Diagnosis not present

## 2024-03-26 MED FILL — Isosorbide Mononitrate Tab ER 24HR 60 MG: ORAL | 90 days supply | Qty: 90 | Fill #0 | Status: AC

## 2024-03-26 NOTE — Progress Notes (Signed)
 Specialty Pharmacy Refill Coordination Note  [name] is a 58 y.o. female contacted today regarding refills of specialty medication(s) Upadacitinib (Rinvoq)   Patient requested Delivery   Delivery date: 03/31/24   Verified address: [address]   [name] [address]   Medication will be filled on 03/30/24.

## 2024-03-27 LAB — CBC WITH DIFFERENTIAL/PLATELET
Absolute Lymphocytes: 1504 {cells}/uL (ref 850–3900)
Absolute Monocytes: 449 {cells}/uL (ref 200–950)
Basophils Absolute: 29 {cells}/uL (ref 0–200)
Basophils Relative: 0.7 %
Eosinophils Absolute: 59 {cells}/uL (ref 15–500)
Eosinophils Relative: 1.4 %
HCT: 33.9 % — ABNORMAL LOW (ref 35.0–45.0)
Hemoglobin: 11.6 g/dL — ABNORMAL LOW (ref 11.7–15.5)
MCH: 29.8 pg (ref 27.0–33.0)
MCHC: 34.2 g/dL (ref 32.0–36.0)
MCV: 87.1 fL (ref 80.0–100.0)
MPV: 9.6 fL (ref 7.5–12.5)
Monocytes Relative: 10.7 %
Neutro Abs: 2159 {cells}/uL (ref 1500–7800)
Neutrophils Relative %: 51.4 %
Platelets: 217 10*3/uL (ref 140–400)
RBC: 3.89 10*6/uL (ref 3.80–5.10)
RDW: 12.4 % (ref 11.0–15.0)
Total Lymphocyte: 35.8 %
WBC: 4.2 10*3/uL (ref 3.8–10.8)

## 2024-03-27 LAB — COMPREHENSIVE METABOLIC PANEL WITH GFR
AG Ratio: 2.3 (calc) (ref 1.0–2.5)
ALT: 17 U/L (ref 6–29)
AST: 21 U/L (ref 10–35)
Albumin: 4.3 g/dL (ref 3.6–5.1)
Alkaline phosphatase (APISO): 63 U/L (ref 37–153)
BUN: 15 mg/dL (ref 7–25)
CO2: 27 mmol/L (ref 20–32)
Calcium: 8.9 mg/dL (ref 8.6–10.4)
Chloride: 99 mmol/L (ref 98–110)
Creat: 0.77 mg/dL (ref 0.50–1.03)
Globulin: 1.9 g/dL (ref 1.9–3.7)
Glucose, Bld: 137 mg/dL — ABNORMAL HIGH (ref 65–99)
Potassium: 3.9 mmol/L (ref 3.5–5.3)
Sodium: 135 mmol/L (ref 135–146)
Total Bilirubin: 0.4 mg/dL (ref 0.2–1.2)
Total Protein: 6.2 g/dL (ref 6.1–8.1)
eGFR: 90 mL/min/{1.73_m2} (ref >=60)

## 2024-03-27 NOTE — Progress Notes (Signed)
 Glucose was 137. Rest of CMP WNL Hemoglobin and hematocrit were low but improving. Rest of CBC WNL.

## 2024-03-30 DIAGNOSIS — E10319 Type 1 diabetes mellitus with unspecified diabetic retinopathy without macular edema: Secondary | ICD-10-CM | POA: Diagnosis not present

## 2024-04-07 ENCOUNTER — Other Ambulatory Visit: Payer: Self-pay

## 2024-04-07 DIAGNOSIS — H16213 Exposure keratoconjunctivitis, bilateral: Secondary | ICD-10-CM | POA: Diagnosis not present

## 2024-04-07 MED ORDER — NEOMYCIN-POLYMYXIN-DEXAMETH 3.5-10000-0.1 OP SUSP
1.0000 [drp] | Freq: Four times a day (QID) | OPHTHALMIC | 0 refills | Status: DC
  Filled 2024-04-07: qty 5, 25d supply, fill #0

## 2024-04-08 ENCOUNTER — Other Ambulatory Visit: Payer: Self-pay

## 2024-04-08 MED FILL — Clopidogrel Bisulfate Tab 75 MG (Base Equiv): ORAL | 90 days supply | Qty: 90 | Fill #0 | Status: AC

## 2024-04-13 DIAGNOSIS — E10319 Type 1 diabetes mellitus with unspecified diabetic retinopathy without macular edema: Secondary | ICD-10-CM | POA: Diagnosis not present

## 2024-04-14 ENCOUNTER — Other Ambulatory Visit: Payer: Self-pay

## 2024-04-14 ENCOUNTER — Other Ambulatory Visit: Payer: Self-pay | Admitting: Rheumatology

## 2024-04-14 ENCOUNTER — Other Ambulatory Visit: Payer: Self-pay | Admitting: Nurse Practitioner

## 2024-04-14 MED FILL — Losartan Potassium Tab 100 MG: ORAL | 90 days supply | Qty: 90 | Fill #0 | Status: AC

## 2024-04-15 ENCOUNTER — Ambulatory Visit (INDEPENDENT_AMBULATORY_CARE_PROVIDER_SITE_OTHER): Admitting: Family Medicine

## 2024-04-15 ENCOUNTER — Encounter (INDEPENDENT_AMBULATORY_CARE_PROVIDER_SITE_OTHER): Payer: Self-pay | Admitting: Family Medicine

## 2024-04-15 ENCOUNTER — Other Ambulatory Visit: Payer: Self-pay

## 2024-04-15 VITALS — BP 148/79 | HR 80 | Temp 98.4°F | Ht 61.0 in | Wt 172.0 lb

## 2024-04-15 DIAGNOSIS — E669 Obesity, unspecified: Secondary | ICD-10-CM

## 2024-04-15 DIAGNOSIS — E108 Type 1 diabetes mellitus with unspecified complications: Secondary | ICD-10-CM | POA: Diagnosis not present

## 2024-04-15 DIAGNOSIS — E1059 Type 1 diabetes mellitus with other circulatory complications: Secondary | ICD-10-CM

## 2024-04-15 DIAGNOSIS — Z6831 Body mass index (BMI) 31.0-31.9, adult: Secondary | ICD-10-CM | POA: Diagnosis not present

## 2024-04-15 DIAGNOSIS — E1159 Type 2 diabetes mellitus with other circulatory complications: Secondary | ICD-10-CM

## 2024-04-15 DIAGNOSIS — I152 Hypertension secondary to endocrine disorders: Secondary | ICD-10-CM

## 2024-04-15 MED ORDER — CYCLOBENZAPRINE HCL 10 MG PO TABS
10.0000 mg | ORAL_TABLET | Freq: Every evening | ORAL | 2 refills | Status: DC | PRN
  Filled 2024-04-15: qty 30, 30d supply, fill #0
  Filled 2024-05-17: qty 30, 30d supply, fill #1
  Filled 2024-06-16: qty 30, 30d supply, fill #0

## 2024-04-15 NOTE — Progress Notes (Signed)
 [name], D.O.  ABFM, ABOM Specializing in Clinical Bariatric Medicine  Office located at: [address] [place], [place]  [address]   Assessment and Plan:  No orders of the defined types were placed in this encounter.  There are no discontinued medications.   No orders of the defined types were placed in this encounter.    FOR THE DISEASE OF OBESITY:  BMI 31.0-31.9,adult - 31.45 Obesity (BMI 30-39.9), Starting BMI 11/26/23 32.02 Assessment & Plan: Since last office visit on 03/17/2024, patient's muscle mass has increased by 3 lbs. Fat mass has decreased by 2.2 lbs. Total body water has increased by 0.4 lbs.  Counseling done on how various foods will affect these numbers and how to maximize success  Total lbs lost to date: 6 lbs Total weight loss percentage to date: 3.37%    Recommended Dietary Goals [name] is currently in the action stage of change. As such, her goal is to continue weight management plan.  She has agreed to: continue current plan with 1100-1200 cal and 80++g of protein   Behavioral Intervention We discussed the following today: Vegetarian protein options, increasing lean protein intake to established goals and avoiding skipping meals  Additional resources provided today: Personalized instruction on how to track grams of protein using Lose It app, Handout on Food Journaling Log  Evidence-based interventions for health behavior change were utilized today including the discussion of self monitoring techniques, problem-solving barriers and SMART goal setting techniques.   Regarding patient's less desirable eating habits and patterns, we employed the technique of small changes.   Pt will specifically work on: Journal meal intake daily for next visit.    Recommended Physical Activity Goals [name] has been advised to work up to 300-450 minutes of moderate intensity aerobic activity a week and strengthening exercises 2-3 times per week for  cardiovascular health, weight loss maintenance and preservation of muscle mass.   She has agreed to : Exercise 30 mins 3 days with strength training included   Pharmacotherapy We both agreed to : continue with nutritional and behavioral strategies   FOR ASSOCIATED CONDITIONS ADDRESSED TODAY:  Type 1 diabetes mellitus with complications [facility]) Assessment & Plan: Lab Results  Component Value Date   HGBA1C 6.0 (A) 01/27/2024   HGBA1C 6.3 (A) 09/26/2023   HGBA1C 7.0 (H) 05/15/2022   INSULIN <0.4 (L) 11/26/2023  Ivis currently has an insulin pump for her T1DM. Reviewed last obtained A1c, last result was 6.0. Pt reports concerns about insulin pump not cutting off insulin soon enough when blood sugars drop. Per her continuous glucose monitor, blood sugars were in range 87%, low 2%, and very low <1% of the time. Her 14 day average glucose is 128, and GMI percent is 6.4%. Lowest blood sugar reported since LOV was 43, this reading was at 1:00pm while pt was working. During this period, she had only drank a glass of milk for breakfast and did not eat lunch yet. [name] endorses keeping glucose tablets on her at all times for emergency hypoglycemic episodes. Additionally, pt tried GLP-1 in the past, but experienced GI upset and acid reflux.  Informed that eating wrong foods, eating late in the day, and/or laying down after eating can cause increase in risk for GI upset when on GLP-1 medication. Increased low alert on continuous glucose monitor to 85, and decreased high alert to 200. Informed pt that increasing protein can ensure blood sugars stay steady. Stressed importance of eating every 3-5 hours in order  to maintain blood sugar levels. Recommended pt consult with endocrinology about potentially lowering amount of insulin in pump. Continue following RCNP. Will continue to monitor alongside specialist.    Hypertension associated with diabetes [facility]) Assessment & Plan: BP Readings from Last 3 Encounters:   04/15/24 (!) 148/79  03/17/24 (!) 147/75  02/18/24 138/60  Relevant medications: Demadex 20 mg daily, Losartan 100 mg daily, Imdur 60 mg daily, and Lopressor 25 mg twice daily Condition managed by cardiology. [name] presented with BP above goal today. She is completely asymptomatic. Checking BP at work 1-2 times a week, usually 120s/70s. Pt wonders if levels are higher d/t medication wearing off as the day goes on. Informed pt that Lopressor is an obesogenic medication. Encouraged to monitor BP levels throughout the day to observe changes. Recommended pt f/up with cardiologist about potential waning effect of medication, and potentially getting off of obesogenic medications. Will continue to monitor alongside cardiology.    Follow up:   Return in about 6 weeks (around 05/27/2024). She was informed of the importance of frequent follow up visits to maximize her success with intensive lifestyle modifications for her multiple health conditions.  Subjective:   Chief complaint: Obesity [name] is here to discuss her progress with her obesity treatment plan. She is on the the Vegetarian Plan and states she is following her eating plan approximately 80% of the time. She states she is exercising 30 minutes 3 days per week and strength training 10 minutes 2 days per week.  Interval History:  [name] is here for a follow up office visit. Since last OV on 03/17/2024, pt is up 1 lb. She has increased walking and introduced strength training in her routine. Pt is doing well on her MP, but when blood sugars drop she tends to eat off plan and sometimes going over calorie parameters. She is using the Lose It app to track intake, with an average calorie level of 1200.   Pharmacotherapy for weight loss: She is currently taking no anti-obesity medication.   Review of Systems:  Pertinent positives were addressed with patient today.  Reviewed by clinician on day of visit: allergies, medications, problem list,  medical history, surgical history, family history, social history, and previous encounter notes.  Weight Summary and Biometrics   Weight Lost Since Last Visit: 0  Weight Gained Since Last Visit: 1    Vitals Temp: 98.4 F (36.9 C) BP: (!) 148/79 Pulse Rate: 80 SpO2: 98 %   Anthropometric Measurements Height: 5\' 2"  (1.575 m) Weight: 172 lb (78 kg) BMI (Calculated): 31.45 Weight at Last Visit: 171 lb Weight Lost Since Last Visit: 0 Weight Gained Since Last Visit: 1 Starting Weight: 178 lb Total Weight Loss (lbs): 6 lb (2.722 kg)   Body Composition  Body Fat %: 43.8 % Fat Mass (lbs): 75.4 lbs Muscle Mass (lbs): 91.8 lbs Total Body Water (lbs): 70.8 lbs Visceral Fat Rating : 11   Other Clinical Data Today's Visit #: 5 Starting Date: 11/26/23    Objective:   PHYSICAL EXAM: Blood pressure (!) 148/79, pulse 80, temperature 98.4 F (36.9 C), height 5\' 2"  (1.575 m), weight 172 lb (78 kg), SpO2 98%. Body mass index is 31.46 kg/m.  General: she is overweight, cooperative and in no acute distress. PSYCH: Has normal mood, affect and thought process.   HEENT: EOMI, sclerae are anicteric. Lungs: Normal breathing effort, no conversational dyspnea. Extremities: Moves * 4 Neurologic: A and O * 3, good insight  DIAGNOSTIC DATA REVIEWED: BMET  Component Value Date/Time   NA 135 03/26/2024 1448   NA 140 11/26/2023 1123   K 3.9 03/26/2024 1448   CL 99 03/26/2024 1448   CO2 27 03/26/2024 1448   GLUCOSE 137 (H) 03/26/2024 1448   BUN 15 03/26/2024 1448   BUN 10 11/26/2023 1123   CREATININE 0.77 03/26/2024 1448   CALCIUM 8.9 03/26/2024 1448   GFRNONAA >60 06/01/2021 0757   GFRNONAA 74 04/07/2021 0855   GFRAA 86 04/07/2021 0855   Lab Results  Component Value Date   HGBA1C 6.0 (A) 01/27/2024   HGBA1C 6.8 06/26/2007   Lab Results  Component Value Date   INSULIN <0.4 (L) 11/26/2023   Lab Results  Component Value Date   TSH 2.050 11/26/2023   CBC     Component Value Date/Time   WBC 4.2 03/26/2024 1448   RBC 3.89 03/26/2024 1448   HGB 11.6 (L) 03/26/2024 1448   HGB 11.1 (L) 09/18/2023 1447   HGB 13.4 02/15/2020 1656   HCT 33.9 (L) 03/26/2024 1448   HCT 38.9 02/15/2020 1656   PLT 217 03/26/2024 1448   PLT 188 09/18/2023 1447   PLT 172 02/15/2020 1656   MCV 87.1 03/26/2024 1448   MCV 87 02/15/2020 1656   MCH 29.8 03/26/2024 1448   MCHC 34.2 03/26/2024 1448   RDW 12.4 03/26/2024 1448   RDW 12.8 02/15/2020 1656   Iron Studies    Component Value Date/Time   IRON 83 09/18/2023 1447   TIBC 406 09/18/2023 1447   FERRITIN 58 09/18/2023 1447   IRONPCTSAT 20 09/18/2023 1447   Lipid Panel     Component Value Date/Time   CHOL 162 11/26/2023 1123   TRIG 96 11/26/2023 1123   HDL 76 11/26/2023 1123   CHOLHDL 2 12/05/2022 1558   VLDL 19.0 12/05/2022 1558   LDLCALC 69 11/26/2023 1123   Hepatic Function Panel     Component Value Date/Time   PROT 6.2 03/26/2024 1448   PROT 6.1 11/26/2023 1123   ALBUMIN 4.4 11/26/2023 1123   AST 21 03/26/2024 1448   ALT 17 03/26/2024 1448   ALKPHOS 76 11/26/2023 1123   BILITOT 0.4 03/26/2024 1448   BILITOT 0.3 11/26/2023 1123   BILIDIR 0.1 11/08/2010 1257   IBILI 0.4 07/05/2009 0743      Component Value Date/Time   TSH 2.050 11/26/2023 1123   Nutritional Lab Results  Component Value Date   VD25OH 92.9 11/26/2023   VD25OH 74.19 12/05/2022   VD25OH 80.30 05/15/2022    Attestations:   I, Camryn Mix, acting as a Stage manager for [employer], DO., have compiled all relevant documentation for today's office visit on behalf of Thomasene Lot, DO, while in the presence of [employer], DO.  I have reviewed the above documentation for accuracy and completeness, and I agree with the above. [name], D.O.  The 21st Century Cures Act was signed into law in 2016 which includes the topic of electronic health records.  This provides immediate access to information in MyChart.   This includes consultation notes, operative notes, office notes, lab results and pathology reports.  If you have any questions about what you read please let us know at your next visit so we can discuss your concerns and take corrective action if need be.  We are right here with you.

## 2024-04-15 NOTE — Telephone Encounter (Signed)
 Last Fill: 01/16/2024  Next Visit: 05/11/2024   Last Visit: 12/10/2023   Dx: Spondylosis of lumbar spine   Current Dose per office note on 12/10/2023: flexeril 10 mg 1 tablet by mouth at bedtime for muscle spasms and insomnia.   Okay to refill Flexeril?

## 2024-04-16 ENCOUNTER — Other Ambulatory Visit: Payer: Self-pay

## 2024-04-16 ENCOUNTER — Other Ambulatory Visit (HOSPITAL_COMMUNITY): Payer: Self-pay

## 2024-04-16 NOTE — Progress Notes (Signed)
 Specialty Pharmacy Refill Coordination Note  [name] is a 58 y.o. female contacted today regarding refills of specialty medication(s) Rinvoq.  Patient requested (Patient-Rptd) Delivery   Delivery date: (Patient-Rptd) 04/27/24   Verified address: (Patient-Rptd) [address] [place] [address]   Medication will be filled on 04/24/24.

## 2024-04-21 ENCOUNTER — Encounter: Payer: Self-pay | Admitting: Pulmonary Disease

## 2024-04-21 ENCOUNTER — Ambulatory Visit: Attending: Pulmonary Disease

## 2024-04-21 ENCOUNTER — Ambulatory Visit: Payer: Commercial Managed Care - PPO | Admitting: Pulmonary Disease

## 2024-04-21 VITALS — BP 126/64 | HR 82 | Temp 97.8°F | Ht 62.0 in | Wt 179.0 lb

## 2024-04-21 DIAGNOSIS — R0602 Shortness of breath: Secondary | ICD-10-CM | POA: Diagnosis not present

## 2024-04-21 DIAGNOSIS — J22 Unspecified acute lower respiratory infection: Secondary | ICD-10-CM | POA: Diagnosis not present

## 2024-04-21 DIAGNOSIS — J683 Other acute and subacute respiratory conditions due to chemicals, gases, fumes and vapors: Secondary | ICD-10-CM | POA: Diagnosis not present

## 2024-04-21 DIAGNOSIS — G4733 Obstructive sleep apnea (adult) (pediatric): Secondary | ICD-10-CM | POA: Diagnosis not present

## 2024-04-21 DIAGNOSIS — J9801 Acute bronchospasm: Secondary | ICD-10-CM | POA: Diagnosis not present

## 2024-04-21 DIAGNOSIS — R0609 Other forms of dyspnea: Secondary | ICD-10-CM | POA: Insufficient documentation

## 2024-04-21 LAB — PULMONARY FUNCTION TEST ARMC ONLY
DL/VA % pred: 114 %
DL/VA: 4.96 ml/min/mmHg/L
DLCO unc % pred: 84 %
DLCO unc: 16.17 ml/min/mmHg
FEF 25-75 Post: 2.04 L/s
FEF 25-75 Pre: 1.98 L/s
FEF2575-%Change-Post: 2 %
FEF2575-%Pred-Post: 84 %
FEF2575-%Pred-Pre: 82 %
FEV1-%Change-Post: 2 %
FEV1-%Pred-Post: 71 %
FEV1-%Pred-Pre: 69 %
FEV1-Post: 1.76 L
FEV1-Pre: 1.71 L
FEV1FVC-%Change-Post: 2 %
FEV1FVC-%Pred-Pre: 107 %
FEV6-%Change-Post: 0 %
FEV6-%Pred-Post: 65 %
FEV6-%Pred-Pre: 65 %
FEV6-Post: 2.02 L
FEV6-Pre: 2.02 L
FEV6FVC-%Pred-Post: 103 %
FEV6FVC-%Pred-Pre: 103 %
FVC-%Change-Post: 0 %
FVC-%Pred-Post: 63 %
FVC-%Pred-Pre: 63 %
FVC-Post: 2.02 L
FVC-Pre: 2.02 L
Post FEV1/FVC ratio: 87 %
Post FEV6/FVC ratio: 100 %
Pre FEV1/FVC ratio: 85 %
Pre FEV6/FVC Ratio: 100 %
RV % pred: 90 %
RV: 1.63 L
TLC % pred: 81 %
TLC: 3.88 L

## 2024-04-21 MED ORDER — ALBUTEROL SULFATE (2.5 MG/3ML) 0.083% IN NEBU
2.5000 mg | INHALATION_SOLUTION | Freq: Once | RESPIRATORY_TRACT | Status: AC
  Administered 2024-04-21: 2.5 mg via RESPIRATORY_TRACT
  Filled 2024-04-21: qty 3

## 2024-04-21 NOTE — Patient Instructions (Addendum)
 VISIT SUMMARY:  Today, you came in for a follow-up appointment to discuss your recurrent respiratory infections and other health concerns. We reviewed your history of respiratory infections, your diabetes management, and your use of a CPAP machine for sleep apnea. We also touched on your arthritis and the need for ongoing management of these conditions.  YOUR PLAN:  -SEVERE OBSTRUCTIVE SLEEP APNEA: Severe obstructive sleep apnea is a condition where your airway becomes blocked during sleep, causing breathing pauses. You need new supplies for your CPAP machine and ongoing management. You will be referred to Dr. Kieran Pellet, a sleep specialist, for further follow-up and management of your sleep apnea.  -DIABETES MELLITUS: Diabetes mellitus is a condition where your blood sugar levels are too high, which can increase your risk of infections. It is important to avoid exposure to infections to help manage this condition.  -ARTHRITIS: Arthritis is a condition that causes inflammation and pain in your joints. It is part of your complex medical history and requires ongoing management.  -RECURRENT RESPIRATORY INFECTIONS: Recurrent respiratory infections are frequent infections of your respiratory system. You should follow up with the pulmonary clinic as needed and contact the clinic if you experience another bout of respiratory symptoms.  You may continue using your AirSupra  as needed.  INSTRUCTIONS:  Please follow up with Dr. Kieran Pellet, the sleep specialist, for your sleep apnea management. Additionally, follow up with the pulmonary clinic on an as-needed basis for any respiratory issues. If you experience another bout of respiratory symptoms, contact the clinic immediately.

## 2024-04-21 NOTE — Progress Notes (Signed)
 Subjective:    Patient ID: [name], female    DOB: Nov 16, 1966, 58 y.o.   MRN: [id number]  Patient Care Team: Dorothe Gaster, NP as PCP - General (Nurse Practitioner) Arnoldo Lapping, MD as PCP - Cardiology (Cardiology) Henrietta Lofts, MD Phebe Brasil, MD as Consulting Physician (Neurology) Phebe Brasil, MD as Referring Physician (Neurology) Avonne Boettcher, MD as Consulting Physician (Hematology and Oncology)  Chief Complaint  Patient presents with   Follow-up    No breathing problems. Not using Advair  since last ov.     BACKGROUND/INTERVAL:58 year old lifelong never smoker who presents for issues with recurrent bouts of lower respiratory tract infection, wheezing and cough. Occurring for 1 year.  She was initially evaluated on 18 February 2024.  She has a history of sleep apnea on CPAP with no recent follow-up in this regard.  HPI Discussed the use of AI scribe software for clinical note transcription with the patient, who gave verbal consent to proceed.  History of Present Illness   [name] is a 58 year old female with recurrent respiratory infections who presents for follow-up.  She has experienced recurrent respiratory infections, which she attributes to her diabetes, potentially increasing her susceptibility to infections. She works in an environment with frequent exposure to potential infections. No recent use of her inhaler due to the absence of a bad cough. Airways reactivity noted when exposed to viruses.  She inquires about her CPAP machine management, noting that she has not seen anyone for a while regarding it. She has had a CPAP machine since 2021 or 2022, with the last appointment missed due to work commitments. She uses a nasal mask and is on an auto set for her severe sleep apnea. Her sleep study was conducted at home in 2022. She needs new supplies for her CPAP machine.     PFTs performed 21 April 2024 showed mild restriction likely secondary to obesity with no  evidence of airway obstruction.  She therefore likely has airways reactivity triggered by viral infections when she is afflicted by these.   Review of Systems A 10 point review of systems was performed and it is as noted above otherwise negative.   Patient Active Problem List   Diagnosis Date Noted   Bronchospasm 04/21/2024   Antibiotic-induced yeast infection 02/13/2024   Acute bronchitis 12/23/2023   Abnormal CBC 12/12/2023   H/O Graves' disease 12/11/2023   Mixed diabetic hyperlipidemia associated with type 1 diabetes mellitus (HCC) 12/11/2023   Dysfunction of both eustachian tubes 11/01/2023   Lower respiratory infection 08/30/2023   Lower extremity edema 06/12/2023   Myofascial pain dysfunction syndrome 03/22/2023   Left foot pain 12/05/2022   Urinary urgency 10/23/2022   Pyelonephritis 10/23/2022   Premature atrial contractions 05/15/2022   PVC's (premature ventricular contractions) 05/15/2022   Low hemoglobin 01/16/2022   Otalgia of right ear 01/09/2022   Sore throat 01/09/2022   OSA on CPAP 08/02/2021   Abnormal MRI, lumbar spine 03/20/2021   Abnormal MRI, thoracic spine 03/20/2021   Chronic mid back pain 03/17/2021   Chronic midline low back pain with left-sided sciatica 03/17/2021   Type 1 diabetes mellitus with diabetic polyneuropathy (HCC) 02/17/2021   Severe obstructive sleep apnea-hypopnea syndrome 02/17/2021   Chronic diastolic CHF (congestive heart failure) (HCC) 12/19/2020   Chronic back pain 12/19/2020   Chronic abdominal pain 12/19/2020   Acute diastolic heart failure (HCC) 12/09/2020   Diabetic retinopathy associated with type 1 diabetes mellitus (HCC) 09/27/2020   Diabetic  retinopathy (HCC) 09/27/2020   Graves' disease 09/27/2020   Chronic coronary artery disease 09/27/2020   Hypertension associated with diabetes (HCC) 09/14/2020   Class 1 obesity 09/14/2020   Preventative health care 09/14/2020   Aortic atherosclerosis (HCC) 09/14/2020   Snoring  05/12/2020   Lung nodule 03/10/2020   Colitis 03/10/2020   Type 1 diabetes mellitus with proliferative retinopathy (HCC) 12/10/2019   Abnormal thyroid  function test 12/10/2019   Bruxism (teeth grinding)    Migraine 10/07/2019   Contusion of left hip 07/25/2019   H/O multiple allergies 03/10/2019   Hx of anaphylaxis 03/10/2019   Cough 06/26/2018   PND (post-nasal drip) 06/26/2018   Lumbar strain, initial encounter 04/10/2018   Thoracic myofascial strain 04/10/2018   Vitamin D  deficiency 07/04/2017   B12 deficiency 07/04/2017   Abnormal CT scan 06/19/2017   Gastroesophageal reflux disease 06/19/2017   Antiplatelet or antithrombotic long-term use 06/19/2017   Nasal congestion 03/15/2017   Graves disease 02/01/2017   Sleep difficulties 02/01/2017   No energy 02/01/2017   Osteopenia 02/01/2017   Bilateral hand pain 07/16/2016   Acquired trigger finger 07/16/2016   Hypertension, essential 05/08/2016   Right foot pain 08/30/2014   Shoulder pain, right 08/30/2014   Chronic pain syndrome 01/15/2014   Multiple joint pain 01/15/2014   Lesion of lower eyelid 04/21/2013   Generalized constriction of visual field 03/31/2013   Neoplasm of uncertain behavior of skin of eyelid 03/31/2013   Posterior capsular opacification 03/31/2013   Cyst of left lower eyelid 03/30/2013   Pseudophakia of both eyes 03/30/2013   Prurigo nodularis 02/05/2013   Stasis dermatitis 02/05/2013   Psoriasis 01/21/2013   Trochanteric bursitis 11/10/2012   Achilles tendinitis 07/11/2012   Epiretinal membrane 06/17/2012   Status post cataract extraction 04/30/2012   Pes equinus, acquired 04/23/2012   Tendinitis of ankle 04/23/2012   Proliferative diabetic retinopathy of both eyes (HCC) 01/11/2012   Ongoing use of possibly toxic medication 12/05/2011   Hyperthyroidism 11/09/2010   SINUS TACHYCARDIA 11/08/2010   Shortness of breath 11/08/2010   Acute non-recurrent maxillary sinusitis 10/16/2010   DERMATITIS,  ALLERGIC 07/20/2010   EDEMA 07/12/2010   Dizziness 11/14/2009   ATRIAL FIBRILLATION 07/20/2009   Hx of CABG 06/07/2009   ANGINA, STABLE/EXERTIONAL 06/02/2009   Dyslipidemia, goal LDL below 70 04/26/2009   ACUT MI ANTEROLAT WALL SUBSQT EPIS CARE 04/26/2009   Chest pain 04/26/2009   DIABETIC  RETINOPATHY 04/25/2009   CARPAL TUNNEL SYNDROME, BILATERAL 04/25/2009   TRIGGER FINGER 04/25/2009   MIGRAINE W/O AURA W/INTRACT W/STATUS MIGRAINOSUS 02/19/2008   URI with cough and congestion 01/24/2007   Type 1 diabetes mellitus with complications (HCC) 10/16/2006   ALLERGIC RHINITIS, SEASONAL 10/16/2006   CHOLELITHIASIS 10/16/2006   Rheumatoid arthritis (HCC) 10/16/2006    Social History   Tobacco Use   Smoking status: Never    Passive exposure: Never   Smokeless tobacco: Never  Substance Use Topics   Alcohol use: Yes    Comment: rarely 1 every 6 months    Allergies  Allergen Reactions   Actemra  [Tocilizumab ]    Ramipril Swelling, Rash and Other (See Comments)   Shellfish-Derived Products Swelling    Shrimp    Atorvastatin Rash    Elevated LFT's   Compazine  [Prochlorperazine Edisylate] Other (See Comments)    Neurological reaction   Emgality  [Galcanezumab -Gnlm] Hives and Swelling   Etanercept Swelling and Rash   Infliximab Rash   Iohexol      Iv contrast dye -rash all over  Orencia [Abatacept] Rash   Prochlorperazine Edisylate     unknown   Tofacitinib Rash and Other (See Comments)    Severe abdominal pain    Trokendi  Xr [Topiramate  Er]     Brain fog, memory issues, word finding issues   Shrimp Extract Other (See Comments)   Topiramate  Other (See Comments)   Tramadol      Nausea    Amiodarone Nausea Only   Rituximab Rash    Causes a rash    Current Meds  Medication Sig   acetaminophen -codeine  (TYLENOL  #3) 300-30 MG tablet Take 1 tablet by mouth 2 (two) times daily as needed for moderate pain (pain score 4-6).   albuterol  (VENTOLIN  HFA) 108 (90 Base) MCG/ACT  inhaler Inhale 2 puffs into the lungs every 4 (four) hours as needed.   Albuterol -Budesonide  (AIRSUPRA ) 90-80 MCG/ACT AERO Inhale 2 puffs into the lungs every 6 (six) hours.   Ascorbic Acid 500 MG CAPS Take by mouth.   aspirin  EC 81 MG tablet    Blood Glucose Monitoring Suppl (FREESTYLE FREEDOM LITE) w/Device KIT Use as directed by physician to check blood sugar   CHELATED MAGNESIUM PO Take 250 mg by mouth daily.   Cholecalciferol (VITAMIN D3) 125 MCG (5000 UT) CAPS Decrease to 1 tab every other day   clopidogrel  (PLAVIX ) 75 MG tablet TAKE 1 TABLET BY MOUTH ONCE DAILY   Cyanocobalamin  (VITAMIN B-12 PO) Take by mouth.   cyclobenzaprine  (FLEXERIL ) 10 MG tablet Take 1 tablet (10 mg total) by mouth at bedtime as needed for muscle spasms.   EPINEPHrine  0.3 mg/0.3 mL IJ SOAJ injection Inject 0.3 mg into the muscle as needed for anaphylaxis.   fexofenadine  (ALLEGRA ) 180 MG tablet Take 1 tablet (180 mg total) by mouth daily.   fluticasone  (FLONASE ) 50 MCG/ACT nasal spray Place 2 sprays into both nostrils daily.   glucose blood (FREESTYLE LITE) test strip Use to check blood sugar 3 time(s) daily   glucose blood test strip    guaiFENesin -codeine  100-10 MG/5ML syrup Take 5 mLs by mouth at bedtime.   insulin  detemir (LEVEMIR ) 100 UNIT/ML injection Inject 36 units into the skin once daily in the event of insulin  pump failure.   insulin  lispro (HUMALOG ) 100 UNIT/ML injection Inject 0.9-1.2 mLs (90-120 Units total) into the skin daily.   Insulin  Syringe-Needle U-100 (BD INSULIN  SYRINGE U/F) 31G X 5/16" 0.3 ML MISC use with insulin  injections 3 times daily in the event of insulin  pump failure.   ipratropium (ATROVENT ) 0.06 % nasal spray Place 2 sprays into both nostrils 4 (four) times daily.   isosorbide  mononitrate (IMDUR ) 60 MG 24 hr tablet Take 1 tablet (60 mg total) by mouth daily.   Krill Oil 500 MG CAPS Take 500 mg by mouth daily.   Lancets (FREESTYLE) lancets Use to check blood sugar 3 time(s) daily.    leflunomide  (ARAVA ) 20 MG tablet Take 1 tablet (20 mg total) by mouth daily.   linaclotide  (LINZESS ) 290 MCG CAPS capsule Take 1 capsule (290 mcg total) by mouth daily before breakfast.   losartan  (COZAAR ) 100 MG tablet Take 1 tablet (100 mg total) by mouth daily.   metoprolol  tartrate (LOPRESSOR ) 50 MG tablet Take 0.5 tablets (25 mg total) by mouth 2 (two) times daily.   montelukast  (SINGULAIR ) 10 MG tablet Take 1 tablet (10 mg total) by mouth at bedtime.   Multiple Vitamin (MULTIVITAMIN) capsule Take 1 capsule by mouth daily.   neomycin -polymyxin b-dexamethasone  (MAXITROL ) 3.5-10000-0.1 SUSP Place 1 drop into both eyes 4 (four)  times daily.   ondansetron  (ZOFRAN ) 4 MG tablet Take 1 tablet (4 mg total) by mouth every 8 (eight) hours as needed for nausea or vomiting.   pantoprazole  (PROTONIX ) 40 MG tablet Take 1 tablet (40 mg total) by mouth 2 (two) times daily.   potassium chloride  SA (KLOR-CON  M20) 20 MEQ tablet Take 1 tablet (20 mEq total) by mouth daily.   rosuvastatin  (CRESTOR ) 20 MG tablet Take 1 tablet (20 mg total) by mouth daily.   torsemide  (DEMADEX ) 20 MG tablet Take 1 tablet (20 mg total) by mouth daily.   Turmeric (QC TUMERIC COMPLEX PO) Take 1,000 mg by mouth.   Upadacitinib  ER (RINVOQ ) 15 MG TB24 Take 1 tablet (15 mg) by mouth daily.   Current Facility-Administered Medications for the 04/21/24 encounter (Office Visit) with Marc Senior, MD  Medication   lidocaine  (XYLOCAINE ) 1 % (with pres) injection 6 mL   lidocaine  (XYLOCAINE ) 1 % (with pres) injection 6 mL    Immunization History  Administered Date(s) Administered   Influenza Inj Mdck Quad With Preservative 09/30/2014   Influenza, Quadrivalent, Recombinant, Inj, Pf 10/14/2013   Influenza, Seasonal, Injecte, Preservative Fre 09/19/2016   Influenza,inj,Quad PF,6+ Mos 09/30/2014, 09/23/2017, 09/03/2018, 10/02/2022   Influenza,inj,quad, With Preservative 09/23/2017   Influenza-Unspecified 09/30/2014, 09/30/2016,  09/03/2018, 09/10/2019, 09/28/2020, 09/28/2021, 10/03/2023   PFIZER(Purple Top)SARS-COV-2 Vaccination 12/28/2019, 01/18/2020   Pneumococcal Conjugate-13 07/16/2016   Pneumococcal Polysaccharide-23 01/01/2004, 10/15/2006, 01/04/2020   Td 10/01/2003   Tdap 01/09/2011, 06/25/2018   Zoster Recombinant(Shingrix) 09/14/2020, 12/15/2020        Objective:     BP 126/64 (BP Location: Left Arm, Patient Position: Sitting, Cuff Size: Normal)   Pulse 82   Temp 97.8 F (36.6 C) (Temporal)   Ht 5\' 2"  (1.575 m)   Wt 179 lb (81.2 kg)   SpO2 97%   BMI 32.74 kg/m   SpO2: 97 %  GENERAL: Weight woman, no acute distress, HEAD: Normocephalic, atraumatic.  EYES: Pupils equal, round, reactive to light.  No scleral icterus.  MOUTH: Dental implants noted, oral mucosa moist.  Pharynx clear. NECK: Supple. No thyromegaly. Trachea midline. No JVD.  No adenopathy. PULMONARY: Good air entry bilaterally.  No adventitious sounds. CARDIOVASCULAR: S1 and S2. Regular rate and rhythm.  No rubs, murmurs or gallops heard. ABDOMEN: Obese otherwise benign. MUSCULOSKELETAL: No joint deformity, no clubbing, no edema.  NEUROLOGIC: Neuro normal. SKIN: Intact,warm,dry. PSYCH: Mood and behavior normal.        Assessment & Plan:     ICD-10-CM   1. Recurrent lower respiratory tract infection  J22     2. Reactive airways dysfunction syndrome (HCC)  J68.3     3. Obstructive sleep apnea  G47.33      Discussion:    Severe obstructive sleep apnea Severe obstructive sleep apnea diagnosed with a home sleep study in 2022. She requires new CPAP supplies and ongoing management. - Refer to Dr. Kieran Pellet, sleep specialist, for follow-up and management of sleep apnea.  Diabetes mellitus Diabetes mellitus increases susceptibility to recurrent respiratory infections. Emphasis on avoiding exposure to infections.  Rheumatoid arthritis Rheumatoid arthritis is part of her complex medical history.  There is also makes her more  susceptible to infections due to relative immunocompromise.  She is followed by rheumatology.  Recurrent respiratory infections Follow-up as needed for recurrent respiratory infections and other pulmonary issues. She should contact the clinic if she experiences another bout of respiratory symptoms. - Follow up with pulmonary clinic on a PRN basis.  Advised if symptoms do not improve or worsen, to please contact office for sooner follow up or seek emergency care.    I spent 30 minutes of dedicated to the care of this patient on the date of this encounter to include pre-visit review of records, face-to-face time with the patient discussing conditions above, post visit ordering of testing, clinical documentation with the electronic health record, making appropriate referrals as documented, and communicating necessary findings to members of the patients care team.     C. Chloe Counter, MD Advanced [facility]ch[facility]    *This note was generated using voice recognition software/Dragon and/or AI transcription program.  Despite best efforts to proofread, errors can occur which can change the meaning. Any transcriptional errors that result from this process are unintentional and may not be fully corrected at the time of dictation.

## 2024-04-23 ENCOUNTER — Ambulatory Visit: Admitting: Sleep Medicine

## 2024-04-23 ENCOUNTER — Encounter: Payer: Self-pay | Admitting: Sleep Medicine

## 2024-04-23 VITALS — BP 118/78 | HR 83 | Ht 62.0 in | Wt 178.6 lb

## 2024-04-23 DIAGNOSIS — I25810 Atherosclerosis of coronary artery bypass graft(s) without angina pectoris: Secondary | ICD-10-CM | POA: Diagnosis not present

## 2024-04-23 DIAGNOSIS — Z6832 Body mass index (BMI) 32.0-32.9, adult: Secondary | ICD-10-CM | POA: Diagnosis not present

## 2024-04-23 DIAGNOSIS — G4733 Obstructive sleep apnea (adult) (pediatric): Secondary | ICD-10-CM | POA: Diagnosis not present

## 2024-04-23 DIAGNOSIS — E66811 Obesity, class 1: Secondary | ICD-10-CM | POA: Diagnosis not present

## 2024-04-23 NOTE — Progress Notes (Signed)
 Name:[name] MRN: [id number] DOB: [birth year]   CHIEF COMPLAINT:  ESTABLISH CARE FOR OSA   HISTORY OF PRESENT ILLNESS:  [name] is a 58 y.o. w/ a h/o CAD s/p CABG, GERD, IBS, DMI and obesity who presents to establish care for OSA. Reports that she was initially diagnosed with severe sleep apnea in 2022 and subsequently started on CPAP therapy. Reports years of inconsistent usage however has decided to increase compliance and restart therapy a few weeks ago. Reports significant weight fluctuations over the years. Denies snoring with CPAP therapy. She is currently using the Airfit N20 nasal mask. Admits to dry mouth and excessive daytime sleepiness. Reports nocturnal awakenings due to nocturia, however does not have difficulty falling back to sleep. Reports a family history of sleep apnea. Denies drowsy driving. Drinks 3-4 sodas daily, occasional alcohol use, denies tobacco or illicit drug use.   Bedtime 9-10 pm Sleep onset 30 mins Rise time 5:30 am   EPWORTH SLEEP SCORE 12    04/23/2024    3:00 PM  Results of the Epworth flowsheet  Sitting and reading 3  Watching TV 2  Sitting, inactive in a public place (e.g. a theatre or a meeting) 2  As a passenger in a car for an hour without a break 1  Lying down to rest in the afternoon when circumstances permit 2  Sitting and talking to someone 0  Sitting quietly after a lunch without alcohol 2  In a car, while stopped for a few minutes in traffic 0  Total score 12    PAST MEDICAL HISTORY :   has a past medical history of ACUT MI ANTEROLAT WALL SUBSQT EPIS CARE, Acute maxillary sinusitis, ALLERGIC RHINITIS, SEASONAL, Anemia, Arthritis, ARTHRITIS, RHEUMATOID, Atrial fibrillation (HCC), Back pain, Bruxism (teeth grinding), CAD, ARTERY BYPASS GRAFT, CARPAL TUNNEL SYNDROME, BILATERAL, CHOLELITHIASIS, Contrast media allergy, DERMATITIS, ALLERGIC, DIABETES MELLITUS, TYPE I, DIABETIC  RETINOPATHY, Graves' disease, Heart attack (HCC),  Hiatal hernia, HYPERLIPIDEMIA-MIXED, Hypertension, HYPERTHYROIDISM, IBS (irritable bowel syndrome), IDA (iron  deficiency anemia), Infertility, female, Insulin  pump in place, Lymphadenopathy of head and neck, MIGRAINE W/O AURA W/INTRACT W/STATUS MIGRAINOSUS (02/19/2008), PONV (postoperative nausea and vomiting), Psoriasis, Rheumatoid arthritis (HCC), SINUS TACHYCARDIA (11/08/2010), Sleep apnea, Stomach ulcer, SVT (supraventricular tachycardia) (HCC), Tendonitis, TRIGGER FINGER, and URI.  has a past surgical history that includes Coronary artery bypass graft; Vitrectomy; Caesarean section; Carpal tunnel release; Cholecystectomy; Cardiac catheterization (N/A, 05/08/2016); Abdominal hysterectomy; Eye surgery; Trigger finger release; Cataract extraction, bilateral; LEFT HEART CATH AND CORS/GRAFTS ANGIOGRAPHY (N/A, 02/22/2020); Colonoscopy with propofol  (N/A, 03/25/2020); Esophagogastroduodenoscopy (egd) with propofol  (N/A, 03/25/2020); Carpal tunnel release (Left, 06/06/2021); and Ulnar nerve transposition (Left, 06/06/2021). Prior to Admission medications   Medication Sig Start Date End Date Taking? Authorizing Provider  acetaminophen -codeine  (TYLENOL  #3) 300-30 MG tablet Take 1 tablet by mouth 2 (two) times daily as needed for moderate pain (pain score 4-6). 02/10/24  Yes Lovorn, Megan, MD  albuterol  (VENTOLIN  HFA) 108 (90 Base) MCG/ACT inhaler Inhale 2 puffs into the lungs every 4 (four) hours as needed.   Yes [provider]  Albuterol -Budesonide  (AIRSUPRA ) 90-80 MCG/ACT AERO Inhale 2 puffs into the lungs every 6 (six) hours. 02/18/24  Yes Marc Senior, MD  Ascorbic Acid 500 MG CAPS Take by mouth. 10/21/13  Yes [provider]  aspirin  EC 81 MG tablet    Yes [provider]  Blood Glucose Monitoring Suppl (FREESTYLE FREEDOM LITE) w/Device KIT Use as directed by physician to check blood sugar 02/27/22  Yes   CHELATED MAGNESIUM PO Take 250 mg by mouth daily.   Yes [provider]   Cholecalciferol (VITAMIN D3) 125 MCG (5000 UT) CAPS Decrease to 1 tab every other day 12/11/23  Yes Opalski, Deborah, DO  clopidogrel  (PLAVIX ) 75 MG tablet TAKE 1 TABLET BY MOUTH ONCE DAILY 01/16/24  Yes Arnoldo Lapping, MD  Cyanocobalamin  (VITAMIN B-12 PO) Take by mouth.   Yes [provider]  cyclobenzaprine  (FLEXERIL ) 10 MG tablet Take 1 tablet (10 mg total) by mouth at bedtime as needed for muscle spasms. 04/15/24  Yes Deveshwar, Clydie Darter, MD  EPINEPHrine  0.3 mg/0.3 mL IJ SOAJ injection Inject 0.3 mg into the muscle as needed for anaphylaxis. 11/14/21  Yes McLean-Scocuzza, Karon Packer, MD  fexofenadine  (ALLEGRA ) 180 MG tablet Take 1 tablet (180 mg total) by mouth daily. 01/21/24  Yes Dorothe Gaster, NP  fluticasone  (FLONASE ) 50 MCG/ACT nasal spray Place 2 sprays into both nostrils daily. 11/27/23  Yes Dorothe Gaster, NP  fluticasone -salmeterol (ADVAIR ) 100-50 MCG/ACT AEPB Inhale 1 puff into the lungs 2 (two) times daily. 02/13/24  Yes Dorothe Gaster, NP  glucose blood (FREESTYLE LITE) test strip Use to check blood sugar 3 time(s) daily 09/26/23  Yes Emilie Harden, MD  glucose blood test strip    Yes [provider]  guaiFENesin -codeine  100-10 MG/5ML syrup Take 5 mLs by mouth at bedtime. 02/06/24  Yes Kaur, Charanpreet, NP  insulin  detemir (LEVEMIR ) 100 UNIT/ML injection Inject 36 units into the skin once daily in the event of insulin  pump failure. 07/09/22  Yes   insulin  lispro (HUMALOG ) 100 UNIT/ML injection Inject 0.9-1.2 mLs (90-120 Units total) into the skin daily. 12/20/23  Yes Emilie Harden, MD  Insulin  Syringe-Needle U-100 (BD INSULIN  SYRINGE U/F) 31G X 5/16" 0.3 ML MISC use with insulin  injections 3 times daily in the event of insulin  pump failure. 11/22/21  Yes   ipratropium (ATROVENT ) 0.06 % nasal spray Place 2 sprays into both nostrils 4 (four) times daily. 08/24/23  Yes Kent Pear, NP  isosorbide  mononitrate (IMDUR ) 60 MG 24 hr tablet Take 1 tablet (60 mg total) by mouth  daily. 03/26/24  Yes Arnoldo Lapping, MD  Krill Oil 500 MG CAPS Take 500 mg by mouth daily.   Yes [provider]  Lancets (FREESTYLE) lancets Use to check blood sugar 3 time(s) daily. 02/27/22  Yes   leflunomide  (ARAVA ) 20 MG tablet Take 1 tablet (20 mg total) by mouth daily. 03/24/24  Yes Romayne Clubs, PA-C  linaclotide  (LINZESS ) 290 MCG CAPS capsule Take 1 capsule (290 mcg total) by mouth daily before breakfast. 08/19/23  Yes Luke Salaam, MD  losartan  (COZAAR ) 100 MG tablet Take 1 tablet (100 mg total) by mouth daily. 04/14/24  Yes Arnoldo Lapping, MD  metoprolol  tartrate (LOPRESSOR ) 50 MG tablet Take 0.5 tablets (25 mg total) by mouth 2 (two) times daily. 03/02/24  Yes Arnoldo Lapping, MD  montelukast  (SINGULAIR ) 10 MG tablet Take 1 tablet (10 mg total) by mouth at bedtime. 02/26/24  Yes Dorothe Gaster, NP  Multiple Vitamin (MULTIVITAMIN) capsule Take 1 capsule by mouth daily.   Yes [provider]  neomycin -polymyxin b-dexamethasone  (MAXITROL ) 3.5-10000-0.1 SUSP Place 1 drop into both eyes 4 (four) times daily. 04/07/24  Yes   ondansetron  (ZOFRAN ) 4 MG tablet Take 1 tablet (4 mg total) by mouth every 8 (eight) hours as needed for nausea or vomiting. 02/10/24  Yes Lovorn, Megan, MD  pantoprazole  (PROTONIX ) 40 MG tablet Take 1 tablet (40 mg total)  by mouth 2 (two) times daily. 01/08/24 01/07/25 Yes Luke Salaam, MD  potassium chloride  SA (KLOR-CON  M20) 20 MEQ tablet Take 1 tablet (20 mEq total) by mouth daily. 08/21/23  Yes Arnoldo Lapping, MD  rosuvastatin  (CRESTOR ) 20 MG tablet Take 1 tablet (20 mg total) by mouth daily. 03/13/24  Yes Arnoldo Lapping, MD  torsemide  (DEMADEX ) 20 MG tablet Take 1 tablet (20 mg total) by mouth daily. 03/02/24  Yes Arnoldo Lapping, MD  Turmeric (QC TUMERIC COMPLEX PO) Take 1,000 mg by mouth.   Yes [provider]  Upadacitinib  ER (RINVOQ ) 15 MG TB24 Take 1 tablet (15 mg) by mouth daily. 02/21/24  Yes Deveshwar, Clydie Darter, MD   Allergies  Allergen Reactions    Actemra  [Tocilizumab ]    Ramipril Swelling, Rash and Other (See Comments)   Shellfish-Derived Products Swelling    Shrimp    Atorvastatin Rash    Elevated LFT's   Compazine  [Prochlorperazine Edisylate] Other (See Comments)    Neurological reaction   Emgality  [Galcanezumab -Gnlm] Hives and Swelling   Etanercept Swelling and Rash   Infliximab Rash   Iohexol      Iv contrast dye -rash all over   Orencia [Abatacept] Rash   Prochlorperazine Edisylate     unknown   Tofacitinib Rash and Other (See Comments)    Severe abdominal pain    Trokendi  Xr [Topiramate  Er]     Brain fog, memory issues, word finding issues   Shrimp Extract Other (See Comments)   Topiramate  Other (See Comments)   Tramadol      Nausea    Amiodarone Nausea Only   Rituximab Rash    Causes a rash    FAMILY HISTORY:  family history includes Anxiety disorder in her mother; Bipolar disorder in her mother; Colon polyps in her father; Depression in her father, mother, and another family member; Diabetes in her father; Eating disorder in her mother; Heart disease in an other family member; Hyperlipidemia in her father, mother, and another family member; Hypertension in her father, mother, and another family member; Migraines in an other family member; Obesity in her father; Parkinson's disease in her father; Sleep apnea in her father and mother; Stroke in her paternal grandmother; Thyroid  disease in her mother. SOCIAL HISTORY:  reports that she has never smoked. She has never been exposed to tobacco smoke. She has never used smokeless tobacco. She reports current alcohol use. She reports that she does not use drugs.   Review of Systems:  Gen:  Denies  fever, sweats, chills weight loss  HEENT: Denies blurred vision, double vision, ear pain, eye pain, hearing loss, nose bleeds, sore throat Cardiac:  No dizziness, chest pain or heaviness, chest tightness,edema, No JVD Resp:   No cough, -sputum production, -shortness of  breath,-wheezing, -hemoptysis,  Gi: Denies swallowing difficulty, stomach pain, nausea or vomiting, diarrhea, constipation, bowel incontinence Gu:  Denies bladder incontinence, burning urine Ext:   Denies Joint pain, stiffness or swelling Skin: Denies  skin rash, easy bruising or bleeding or hives Endoc:  Denies polyuria, polydipsia , polyphagia or weight change Psych:   Denies depression, insomnia or hallucinations  Other:  All other systems negative  VITAL SIGNS: BP 118/78 (BP Location: Right Arm, Patient Position: Sitting, Cuff Size: Large)   Pulse 83   Ht 5\' 2"  (1.575 m)   Wt 178 lb 9.6 oz (81 kg)   SpO2 100%   BMI 32.67 kg/m    Physical Examination:   General Appearance: No distress  EYES PERRLA, EOM intact.  NECK Supple, No JVD Pulmonary: normal breath sounds, No wheezing.  CardiovascularNormal S1,S2.  No m/r/g.   Abdomen: Benign, Soft, non-tender. Skin:   warm, no rashes, no ecchymosis  Extremities: normal, no cyanosis, clubbing. Neuro:without focal findings,  speech normal  PSYCHIATRIC: Mood, affect within normal limits.   ASSESSMENT AND PLAN  OSA Patient is using and benefiting from CPAP therapy. Full compliance download unavailable. For mask discomfort, fit patient with the AirTouch N30i nasal mask. Discussed the consequences of untreated sleep apnea. Advised not to drive drowsy for safety of patient and others. Will follow up in 3 months.    CAD Stable, on current management.   Obesity Counseled patient on diet and lifestyle modification.    Patient  satisfied with Plan of action and management. All questions answered  I spent a total of 31 minutes reviewing chart data, face-to-face evaluation with the patient, counseling and coordination of care as detailed above.    Chandler Stofer, M.D.  Sleep Medicine [facility]

## 2024-04-24 ENCOUNTER — Other Ambulatory Visit: Payer: Self-pay

## 2024-04-26 ENCOUNTER — Encounter: Payer: Self-pay | Admitting: Pulmonary Disease

## 2024-04-27 ENCOUNTER — Ambulatory Visit: Payer: Commercial Managed Care - PPO | Admitting: Physical Medicine and Rehabilitation

## 2024-04-27 NOTE — Progress Notes (Signed)
 Office Visit Note  Patient: [name]             Date of Birth: 04-04-1966           MRN: [id number]             PCP: Dorothe Gaster, NP Referring: Dorothe Gaster, NP Visit Date: 05/11/2024 Occupation: @GUAROCC @  Subjective:  Pain in multiple joints   History of Present Illness: [name] is a 58 y.o. female with history of seronegative rheumatoid arthritis and DDD.  Patient remains on  Rinvoq  15 mg 1 tablet by mouth once daily and Arava  20 mg 1 tablet by mouth daily.  She is tolerating combination therapy without any side effects and has not had any interruptions.  Patient states for the past few weeks she has been experiencing increased arthralgias and joint stiffness.  She is currently having total body pain.  Her pain and inflammation has been more severe involving both hands, both ankles, and both feet.  She has been taking Tylenol  #3 half tablet at bedtime and Flexeril  10 mg at bedtime.  She continues to have interrupted sleep at night.  She has been having to take ibuprofen 800 mg 1-2 times a day as needed for pain relief.  She previously had to discontinue Relafen  due to flares of colitis. Patient states that she is scheduled for blepharoplasty on 10/28/2024.  Activities of Daily Living:  Patient reports morning stiffness for 1 hour.   Patient Reports nocturnal pain.  Difficulty dressing/grooming: Denies Difficulty climbing stairs: Denies Difficulty getting out of chair: Reports Difficulty using hands for taps, buttons, cutlery, and/or writing: Reports  Review of Systems  Constitutional:  Positive for fatigue.  HENT:  Negative for mouth sores and mouth dryness.   Eyes:  Positive for dryness.  Respiratory:  Negative for shortness of breath.   Cardiovascular:  Negative for chest pain and palpitations.  Gastrointestinal:  Positive for constipation. Negative for blood in stool and diarrhea.  Endocrine: Negative for increased urination.  Genitourinary:  Negative for  involuntary urination.  Musculoskeletal:  Positive for joint pain, joint pain, joint swelling, myalgias, muscle weakness, morning stiffness, muscle tenderness and myalgias. Negative for gait problem.  Skin:  Negative for color change, rash, hair loss and sensitivity to sunlight.  Allergic/Immunologic: Positive for susceptible to infections.  Neurological:  Positive for headaches. Negative for dizziness.  Hematological:  Negative for swollen glands.  Psychiatric/Behavioral:  Positive for sleep disturbance. Negative for depressed mood. The patient is not nervous/anxious.     PMFS History:  Patient Active Problem List   Diagnosis Date Noted   Bronchospasm 04/21/2024   Antibiotic-induced yeast infection 02/13/2024   Acute bronchitis 12/23/2023   Abnormal CBC 12/12/2023   H/O Graves' disease 12/11/2023   Mixed diabetic hyperlipidemia associated with type 1 diabetes mellitus (HCC) 12/11/2023   Dysfunction of both eustachian tubes 11/01/2023   Lower respiratory infection 08/30/2023   Lower extremity edema 06/12/2023   Myofascial pain dysfunction syndrome 03/22/2023   Left foot pain 12/05/2022   Urinary urgency 10/23/2022   Pyelonephritis 10/23/2022   Premature atrial contractions 05/15/2022   PVC's (premature ventricular contractions) 05/15/2022   Low hemoglobin 01/16/2022   Otalgia of right ear 01/09/2022   Sore throat 01/09/2022   OSA on CPAP 08/02/2021   Abnormal MRI, lumbar spine 03/20/2021   Abnormal MRI, thoracic spine 03/20/2021   Chronic mid back pain 03/17/2021   Chronic midline low back pain with left-sided sciatica 03/17/2021  Type 1 diabetes mellitus with diabetic polyneuropathy (HCC) 02/17/2021   Severe obstructive sleep apnea-hypopnea syndrome 02/17/2021   Chronic diastolic CHF (congestive heart failure) (HCC) 12/19/2020   Chronic back pain 12/19/2020   Chronic abdominal pain 12/19/2020   Acute diastolic heart failure (HCC) 12/09/2020   Diabetic retinopathy associated  with type 1 diabetes mellitus (HCC) 09/27/2020   Diabetic retinopathy (HCC) 09/27/2020   Graves' disease 09/27/2020   Chronic coronary artery disease 09/27/2020   Hypertension associated with diabetes (HCC) 09/14/2020   Class 1 obesity 09/14/2020   Preventative health care 09/14/2020   Aortic atherosclerosis (HCC) 09/14/2020   Snoring 05/12/2020   Lung nodule 03/10/2020   Colitis 03/10/2020   Type 1 diabetes mellitus with proliferative retinopathy (HCC) 12/10/2019   Abnormal thyroid  function test 12/10/2019   Bruxism (teeth grinding)    Migraine 10/07/2019   Contusion of left hip 07/25/2019   H/O multiple allergies 03/10/2019   Hx of anaphylaxis 03/10/2019   Cough 06/26/2018   PND (post-nasal drip) 06/26/2018   Lumbar strain, initial encounter 04/10/2018   Thoracic myofascial strain 04/10/2018   Vitamin D  deficiency 07/04/2017   B12 deficiency 07/04/2017   Abnormal CT scan 06/19/2017   Gastroesophageal reflux disease 06/19/2017   Antiplatelet or antithrombotic long-term use 06/19/2017   Nasal congestion 03/15/2017   Graves disease 02/01/2017   Sleep difficulties 02/01/2017   No energy 02/01/2017   Osteopenia 02/01/2017   Bilateral hand pain 07/16/2016   Acquired trigger finger 07/16/2016   Hypertension, essential 05/08/2016   Right foot pain 08/30/2014   Shoulder pain, right 08/30/2014   Chronic pain syndrome 01/15/2014   Multiple joint pain 01/15/2014   Lesion of lower eyelid 04/21/2013   Generalized constriction of visual field 03/31/2013   Neoplasm of uncertain behavior of skin of eyelid 03/31/2013   Posterior capsular opacification 03/31/2013   Cyst of left lower eyelid 03/30/2013   Pseudophakia of both eyes 03/30/2013   Prurigo nodularis 02/05/2013   Stasis dermatitis 02/05/2013   Psoriasis 01/21/2013   Trochanteric bursitis 11/10/2012   Achilles tendinitis 07/11/2012   Epiretinal membrane 06/17/2012   Status post cataract extraction 04/30/2012   Pes equinus,  acquired 04/23/2012   Tendinitis of ankle 04/23/2012   Proliferative diabetic retinopathy of both eyes (HCC) 01/11/2012   Ongoing use of possibly toxic medication 12/05/2011   Hyperthyroidism 11/09/2010   SINUS TACHYCARDIA 11/08/2010   Shortness of breath 11/08/2010   Acute non-recurrent maxillary sinusitis 10/16/2010   DERMATITIS, ALLERGIC 07/20/2010   EDEMA 07/12/2010   Dizziness 11/14/2009   ATRIAL FIBRILLATION 07/20/2009   Hx of CABG 06/07/2009   ANGINA, STABLE/EXERTIONAL 06/02/2009   Dyslipidemia, goal LDL below 70 04/26/2009   ACUT MI ANTEROLAT WALL SUBSQT EPIS CARE 04/26/2009   Chest pain 04/26/2009   DIABETIC  RETINOPATHY 04/25/2009   CARPAL TUNNEL SYNDROME, BILATERAL 04/25/2009   TRIGGER FINGER 04/25/2009   MIGRAINE W/O AURA W/INTRACT W/STATUS MIGRAINOSUS 02/19/2008   URI with cough and congestion 01/24/2007   Type 1 diabetes mellitus with complications (HCC) 10/16/2006   ALLERGIC RHINITIS, SEASONAL 10/16/2006   CHOLELITHIASIS 10/16/2006   Rheumatoid arthritis (HCC) 10/16/2006    Past Medical History:  Diagnosis Date   ACUT MI ANTEROLAT WALL SUBSQT EPIS CARE    Acute maxillary sinusitis    ALLERGIC RHINITIS, SEASONAL    Anemia    Arthritis    ARTHRITIS, RHEUMATOID    shoulders and hands Enbrel>leg swelling Dr. Alvira Josephs   Atrial fibrillation [facility])    a. after CABG.   Back  pain    Bruxism (teeth grinding)    CAD, ARTERY BYPASS GRAFT    a. DES to RCA in 2010 then LAD occlusion s/p CABG 3 06/07/2009 with LIMA to LAD, reverse SVG to D1, reverse SVG to distal RCA. b. Cath 05/08/2016 slightly hypodense region in the intermediate branch, however she had excellent flow, FFR was normal. Vein graft to PDA and the posterolateral branch is patent, patent LIMA to LAD, occluded SVG to diagonal.   CARPAL TUNNEL SYNDROME, BILATERAL    CHOLELITHIASIS    Contrast media allergy    DERMATITIS, ALLERGIC    DIABETES MELLITUS, TYPE I    on insulin  pump dx'ed age 33 y.o    DIABETIC   RETINOPATHY    Graves' disease    Heart attack (HCC)    Hiatal hernia    HYPERLIPIDEMIA-MIXED    Hypertension    HYPERTHYROIDISM    Dr. Annell Kidney   IBS (irritable bowel syndrome)    IDA (iron  deficiency anemia)    Infertility, female    Insulin  pump in place    Lymphadenopathy of head and neck    MIGRAINE W/O AURA W/INTRACT W/STATUS MIGRAINOSUS 02/19/2008   PONV (postoperative nausea and vomiting)    Psoriasis    Rheumatoid arthritis (HCC)    SINUS TACHYCARDIA 11/08/2010   Sleep apnea    not using machine yet    Stomach ulcer    SVT (supraventricular tachycardia) (HCC)    after s/p CABG   Tendonitis    TRIGGER FINGER    all fingers b/l hands    URI     Family History  Problem Relation Age of Onset   Depression Mother    Anxiety disorder Mother    Hypertension Mother    Hyperlipidemia Mother    Thyroid  disease Mother    Bipolar disorder Mother    Sleep apnea Mother    Eating disorder Mother    Colon polyps Father    Diabetes Father        2   Hypertension Father    Parkinson's disease Father    Hyperlipidemia Father    Depression Father    Sleep apnea Father    Obesity Father    Stroke Paternal Grandmother    Heart disease Other    Hypertension Other    Hyperlipidemia Other    Depression Other    Migraines Other    Heart attack Neg Hx    Colon cancer Neg Hx    Stomach cancer Neg Hx    Esophageal cancer Neg Hx    Past Surgical History:  Procedure Laterality Date   ABDOMINAL HYSTERECTOMY     endometriomas b/l; total ? cervix removed; no h/o abnormal pap   Caesarean section     CARDIAC CATHETERIZATION N/A 05/08/2016   Procedure: Left Heart Cath and Cors/Grafts Angiography;  Surgeon: Odie Benne, MD;  Location: [facility];  Service: Cardiovascular;  Laterality: N/A;   CARPAL TUNNEL RELEASE     b/l    CARPAL TUNNEL RELEASE Left 06/06/2021   Procedure: CARPAL TUNNEL RELEASE;  Surgeon: Lyanne Sample, MD;  Location: [facility];  Service: Orthopedics;  Laterality: Left;  AXILLARY BLOCK   CATARACT EXTRACTION, BILATERAL     CHOLECYSTECTOMY     COLONOSCOPY WITH PROPOFOL  N/A 03/25/2020   Procedure: COLONOSCOPY WITH PROPOFOL ;  Surgeon: Luke Salaam, MD;  Location: [facility];  Service: Gastroenterology;  Laterality: N/A;   CORONARY ARTERY BYPASS GRAFT  ESOPHAGOGASTRODUODENOSCOPY (EGD) WITH PROPOFOL  N/A 03/25/2020   Procedure: ESOPHAGOGASTRODUODENOSCOPY (EGD) WITH PROPOFOL ;  Surgeon: Luke Salaam, MD;  Location: [facility] ENDOSCOPY;  Service: Gastroenterology;  Laterality: N/A;   EYE SURGERY     laser x 2 for retinopathy    LEFT HEART CATH AND CORS/GRAFTS ANGIOGRAPHY N/A 02/22/2020   Procedure: LEFT HEART CATH AND CORS/GRAFTS ANGIOGRAPHY;  Surgeon: Odie Benne, MD;  Location: [facility];  Service: Cardiovascular;  Laterality: N/A;   TRIGGER FINGER RELEASE     b/l fingers all    ULNAR NERVE TRANSPOSITION Left 06/06/2021   Procedure: ULNAR NERVE DECOMPRESSION;  Surgeon: Lyanne Sample, MD;  Location: [facility];  Service: Orthopedics;  Laterality: Left;  AXILLARY BLOCK   VITRECTOMY     b/l    Social History   Social History Narrative   Divorced. Has 2 kids(fraternal twins) daughters age 67. Works at  [employer], Never smoked, denies ETOH, no drugs. Drinks diet coke. No exercise.       DPR daughters Lovett Ruck and Teylie Forry twins          Immunization History  Administered Date(s) Administered   Influenza Inj Mdck Quad With Preservative 09/30/2014   Influenza, Quadrivalent, Recombinant, Inj, Pf 10/14/2013   Influenza, Seasonal, Injecte, Preservative Fre 09/19/2016   Influenza,inj,Quad PF,6+ Mos 09/30/2014, 09/23/2017, 09/03/2018, 10/02/2022   Influenza,inj,quad, With Preservative 09/23/2017   Influenza-Unspecified 09/30/2014, 09/30/2016, 09/03/2018, 09/10/2019, 09/28/2020, 09/28/2021, 10/03/2023   PFIZER(Purple Top)SARS-COV-2 Vaccination 12/28/2019, 01/18/2020   Pneumococcal  Conjugate-13 07/16/2016   Pneumococcal Polysaccharide-23 01/01/2004, 10/15/2006, 01/04/2020   Td 10/01/2003   Tdap 01/09/2011, 06/25/2018   Zoster Recombinant(Shingrix) 09/14/2020, 12/15/2020     Objective: Vital Signs: BP 131/81 (BP Location: Left Arm, Patient Position: Sitting, Cuff Size: Normal)   Pulse 80   Resp 16   Ht 5' 2.5" (1.588 m)   Wt 180 lb (81.6 kg)   BMI 32.40 kg/m    Physical Exam Vitals and nursing note reviewed.  Constitutional:      Appearance: She is well-developed.  HENT:     Head: Normocephalic and atraumatic.  Eyes:     Conjunctiva/sclera: Conjunctivae normal.  Cardiovascular:     Rate and Rhythm: Normal rate and regular rhythm.     Heart sounds: Normal heart sounds.  Pulmonary:     Effort: Pulmonary effort is normal.     Breath sounds: Normal breath sounds.  Abdominal:     General: Bowel sounds are normal.     Palpations: Abdomen is soft.  Musculoskeletal:     Cervical back: Normal range of motion.  Lymphadenopathy:     Cervical: No cervical adenopathy.  Skin:    General: Skin is warm and dry.     Capillary Refill: Capillary refill takes less than 2 seconds.  Neurological:     Mental Status: She is alert and oriented to person, place, and time.  Psychiatric:        Behavior: Behavior normal.      Musculoskeletal Exam: C-spine has limited ROM with lateral rotation.  Tenderness over the lateral epicondyle of both elbows. Rheumatoid nodule on extensor surface of left elbow. Tenderness of bilateral 4th PIP joints.  Tenderness across MCP joints.  PIP and DIP thickening.  Painful ROM of both hips.  Tenderness over bilateral trochanteric bursa.  Knee joints have good ROM with no warmth or effusion. Tenderness of both ankles noted.  Pitting edema noted in bilateral lower extremities.    CDAI Exam: CDAI Score: -- Patient Global: --;  Provider Global: -- Swollen: --; Tender: -- Joint Exam 05/11/2024   No joint exam has been documented for this  visit   There is currently no information documented on the homunculus. Go to the Rheumatology activity and complete the homunculus joint exam.  Investigation: No additional findings.  Imaging: No results found.  Recent Labs: Lab Results  Component Value Date   WBC 4.2 03/26/2024   HGB 11.6 (L) 03/26/2024   PLT 217 03/26/2024   NA 135 03/26/2024   K 3.9 03/26/2024   CL 99 03/26/2024   CO2 27 03/26/2024   GLUCOSE 137 (H) 03/26/2024   BUN 15 03/26/2024   CREATININE 0.77 03/26/2024   BILITOT 0.4 03/26/2024   ALKPHOS 76 11/26/2023   AST 21 03/26/2024   ALT 17 03/26/2024   PROT 6.2 03/26/2024   ALBUMIN 4.4 11/26/2023   CALCIUM  8.9 03/26/2024   GFRAA 86 04/07/2021   QFTBGOLDPLUS NEGATIVE 10/31/2021    Speciality Comments: TB Gold: 10/15/2023 Neg   PLQ Eye Exam: 01/06/2020 ABNORMAL @ [facility]. Patient advised to discontinue PLQ at office visit on 01/06/2020.  Prior Therapy: methotrexate/Xeljanz/Actemra  (GI upset), Enbrel (injection site reaction) and Orencia/Remicade/Humira/Cimzia/Rituxan/Simponi /Simponi  Aria (inadequate response).  Procedures:  No procedures performed Allergies: Actemra  [tocilizumab ], Ramipril, Shellfish-derived products, Atorvastatin, Compazine  [prochlorperazine edisylate], Emgality  [galcanezumab -gnlm], Etanercept, Infliximab, Iohexol , Orencia [abatacept], Prochlorperazine edisylate, Tofacitinib, Trokendi  xr [topiramate  er], Shrimp extract, Topiramate , Tramadol , Amiodarone, and Rituximab    Assessment / Plan:     Visit Diagnoses: Rheumatoid arthritis of multiple sites with negative rheumatoid factor (HCC) - Ultrasound of both hands negative for synovitis and tenosynovitis on 03/14/2023: Patient presents today experiencing increased pain and inflammation involving both hands, both ankles, both feet which started 2 to 3 weeks ago.  She has been under increased stress which may be contributing to her symptoms.  She remains on Rinvoq  15 mg  1 tablet by mouth daily and Arava  20 mg 1 tablet by mouth daily.  She is tolerating combination therapy without any side effects and has not had any interruptions.  Patient is under the care of Dr. Lovorn for pain management and has been taking tylenol  #3 half tablet at bedtime for pain relief.  For the past few weeks she has been having to take ibuprofen more frequently due to the exacerbation of symptoms.  She has been taking ibuprofen 800 mg 1-2 times per day as needed.  She has tenderness of all MCP joints and bilateral fourth PIP joints.  She is also having tenderness of both ankle joints.  Patient requested a prednisone  taper to alleviate her current symptoms.  She is able to adjust her insulin  accordingly.  A prednisone  taper starting 20 mg taper by 5 mg every 2 days was sent to the pharmacy.  Advised patient to avoid the use of NSAIDs while taking prednisone .  She will remain on Rinvoq  and Arava  as prescribed.  She was vies to notify us  if she continues to have recurrent flares.  She will follow-up in the office in 5 months or sooner if needed.  High risk medication use - Rinvoq  15 mg 1 tablet by mouth once daily and Arava  20 mg 1 tablet by mouth daily. CBC and CMP updated on 03/26/24.  Her next lab work will be due in June and every 3 months.  Lipid panel updated on 11/26/23. TB Gold: 10/15/2023 Neg  No recurrent infections. Discussed the importance of holding rinvoq  and arava  if she develops signs or symptoms of an  infection and to resume once the infection has completely cleared.   Psoriasis: No active lesions.   Contracture of joint of both elbows: Unchanged.  No synovitis noted. Tenderness over the lateral condyle of both elbows.  Rheumatoid nodule noted on extensor surface of the left elbow.  Chronic pain of right knee: No warmth or effusion noted.  DDD (degenerative disc disease), thoracic:  Postural thoracic kyphosis noted.   Spondylosis of lumbar spine - Dr. Vaughn Georges.  She takes  flexeril  10 mg 1 tablet by mouth at bedtime and tylenol  #3 half tablet at bedtime for pain relief.   Osteopenia of multiple sites - DEXA on 11/11/19: The BMD measured at Femur Neck Left is 0.901 g/cm2 with a T-score of -1.0-Normal. Due to update DEXA in November 2025.  Piriformis syndrome of right side: Not currently symptomatic.   Other fatigue: Chronic, secondary to insomnia.    Other insomnia: She has been experiencing interrupted sleep at night due to nocturnal pain. She has been taking tylenol  #3 half tablet at bedtime for pain relief.   History of vitamin D  deficiency:She is taking a vitamin D  supplement every other day.   Other medical conditions are listed as follows:   History of gastroesophageal reflux (GERD)  History of Graves' disease  History of coronary artery disease  History of hyperlipidemia  History of diabetes mellitus: Patient has insulin  pump and is able to adjust her insulin  while taking prednisone .  History of hypertension: Blood pressure was 131/81 today in the office.  History of migraine  Orders: No orders of the defined types were placed in this encounter.  Meds ordered this encounter  Medications   predniSONE  (DELTASONE ) 5 MG tablet    Sig: Take 4 tablets (20 mg total) by mouth daily for 2 days, THEN 3 tablets (15 mg total) daily for 2 days, THEN 2 tablets (10 mg total) daily for 2 days, THEN 1 tablet (5 mg total) daily for 2 days.    Dispense:  20 tablet    Refill:  0    Follow-Up Instructions: Return in about 5 months (around 10/11/2024) for Rheumatoid arthritis.   Romayne Clubs, PA-C  Note - This record has been created using Dragon software.  Chart creation errors have been sought, but may not always  have been located. Such creation errors do not reflect on  the standard of medical care.

## 2024-05-01 ENCOUNTER — Encounter
Payer: Commercial Managed Care - PPO | Attending: Physical Medicine and Rehabilitation | Admitting: Physical Medicine and Rehabilitation

## 2024-05-01 ENCOUNTER — Encounter: Payer: Self-pay | Admitting: Physical Medicine and Rehabilitation

## 2024-05-01 ENCOUNTER — Other Ambulatory Visit: Payer: Self-pay

## 2024-05-01 VITALS — BP 130/79 | HR 79 | Wt 177.0 lb

## 2024-05-01 DIAGNOSIS — G43011 Migraine without aura, intractable, with status migrainosus: Secondary | ICD-10-CM | POA: Insufficient documentation

## 2024-05-01 DIAGNOSIS — S39012A Strain of muscle, fascia and tendon of lower back, initial encounter: Secondary | ICD-10-CM | POA: Insufficient documentation

## 2024-05-01 DIAGNOSIS — M0609 Rheumatoid arthritis without rheumatoid factor, multiple sites: Secondary | ICD-10-CM | POA: Insufficient documentation

## 2024-05-01 DIAGNOSIS — M7918 Myalgia, other site: Secondary | ICD-10-CM | POA: Diagnosis not present

## 2024-05-01 MED ORDER — ACETAMINOPHEN-CODEINE 300-30 MG PO TABS
1.0000 | ORAL_TABLET | Freq: Two times a day (BID) | ORAL | 3 refills | Status: DC | PRN
  Filled 2024-05-01: qty 60, 30d supply, fill #0
  Filled 2024-05-01: qty 54, 27d supply, fill #0
  Filled 2024-05-01 (×2): qty 6, 3d supply, fill #0

## 2024-05-01 MED ORDER — LIDOCAINE HCL 1 % IJ SOLN: 6.0000 mL | Freq: Once | INTRAMUSCULAR | Status: AC

## 2024-05-01 NOTE — Progress Notes (Signed)
 Pt is a 58 yr old female with hx of RA, (-) rh factor, thoracic DDD, DM1 with retinopathy- Ac 6.5, migraines without aura, and MI/CABG in past-  Also has Graves disease; and just dx'd with tendinitis in B/L hamstring tendinosis- Here for f/u of chronic pain and trigger point injections .           Same old stuff    Was getting itching from tylenol  #3- if takes half, it's OK. Only had 7 pills, so does help when takes it though.    Did see pulmonary- not chest CT_ "cannot afford to get sick".   Hasn't been sick since that last time in February.    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   Didn't need Zofran  so far for Tylenol  #3.     Plan: Will refill Tylenol  # 3- #60- with 3 refills since working when really needs it.    2.  If gets sick again, please call Pulmonary for CT scan.    3. Allergic to Tramadol  and Duloxetine  makes her depressed.    4. Patient here for trigger point injections for  Consent done and on chart.  Cleaned areas with alcohol and injected using a 27 gauge 1.5 inch needle  Injected 5cc- wasted 1cc Using 1% Lidocaine  with no EPI  Upper traps B/L  Levators- B/L  Posterior scalenes Middle scalenes- B/L  Splenius Capitus- b/L  Pectoralis Major Rhomboids- b/L x2 Infraspinatus Teres Major/minor Thoracic paraspinals- B/L  Lumbar paraspinals- b/L x2 Other injections-    Patient's level of pain prior was 6/10 Current level of pain after injections is about the same- has to get over injections  There was no bleeding or complications.  Patient was advised to drink a lot of water on day after injections to flush system Will have increased soreness for 12-48 hours after injections.  Can use Lidocaine  patches the day AFTER injections Can use theracane on day of injections in places didn't inject Can use heating pad 4-6 hours AFTER injections   5. F/U in q 6 weeks- for Trp injections and f/u on pain.    I spent a  total of 25   minutes on total care today- >50% coordination of care- due to 5 minute son trp injection- rest d/w pt about pain mgmt

## 2024-05-01 NOTE — Patient Instructions (Signed)
 Plan: Will refill Tylenol  # 3- #60- with 3 refills since working when really needs it.    2.  If gets sick again, please call Pulmonary for CT scan.    3. Allergic to Tramadol  and Duloxetine  makes her depressed.    4. Patient here for trigger point injections for  Consent done and on chart.  Cleaned areas with alcohol and injected using a 27 gauge 1.5 inch needle  Injected 5cc- wasted 1cc Using 1% Lidocaine  with no EPI  Upper traps B/L  Levators- B/L  Posterior scalenes Middle scalenes- B/L  Splenius Capitus- b/L  Pectoralis Major Rhomboids- b/L x2 Infraspinatus Teres Major/minor Thoracic paraspinals- B/L  Lumbar paraspinals- b/L x2 Other injections-    Patient's level of pain prior was 6/10 Current level of pain after injections is about the same- has to get over injections  There was no bleeding or complications.  Patient was advised to drink a lot of water on day after injections to flush system Will have increased soreness for 12-48 hours after injections.  Can use Lidocaine  patches the day AFTER injections Can use theracane on day of injections in places didn't inject Can use heating pad 4-6 hours AFTER injections   5. F/U in q 6 weeks- for Trp injections and f/u on pain.

## 2024-05-04 ENCOUNTER — Other Ambulatory Visit: Payer: Self-pay

## 2024-05-04 MED FILL — Fexofenadine HCl Tab 180 MG: ORAL | 90 days supply | Qty: 90 | Fill #1 | Status: CN

## 2024-05-08 ENCOUNTER — Other Ambulatory Visit: Payer: Self-pay

## 2024-05-08 ENCOUNTER — Other Ambulatory Visit: Payer: Self-pay | Admitting: Internal Medicine

## 2024-05-11 ENCOUNTER — Other Ambulatory Visit: Payer: Self-pay

## 2024-05-11 ENCOUNTER — Encounter: Payer: Self-pay | Admitting: Physician Assistant

## 2024-05-11 ENCOUNTER — Other Ambulatory Visit: Payer: Self-pay | Admitting: Internal Medicine

## 2024-05-11 ENCOUNTER — Ambulatory Visit: Payer: Commercial Managed Care - PPO | Attending: Physician Assistant | Admitting: Physician Assistant

## 2024-05-11 VITALS — BP 131/81 | HR 80 | Resp 16 | Ht 62.5 in | Wt 180.0 lb

## 2024-05-11 DIAGNOSIS — L409 Psoriasis, unspecified: Secondary | ICD-10-CM | POA: Diagnosis not present

## 2024-05-11 DIAGNOSIS — M5134 Other intervertebral disc degeneration, thoracic region: Secondary | ICD-10-CM | POA: Diagnosis not present

## 2024-05-11 DIAGNOSIS — M0609 Rheumatoid arthritis without rheumatoid factor, multiple sites: Secondary | ICD-10-CM | POA: Diagnosis not present

## 2024-05-11 DIAGNOSIS — M25561 Pain in right knee: Secondary | ICD-10-CM

## 2024-05-11 DIAGNOSIS — Z8679 Personal history of other diseases of the circulatory system: Secondary | ICD-10-CM

## 2024-05-11 DIAGNOSIS — M8589 Other specified disorders of bone density and structure, multiple sites: Secondary | ICD-10-CM | POA: Diagnosis not present

## 2024-05-11 DIAGNOSIS — G4709 Other insomnia: Secondary | ICD-10-CM

## 2024-05-11 DIAGNOSIS — M24522 Contracture, left elbow: Secondary | ICD-10-CM

## 2024-05-11 DIAGNOSIS — Z8719 Personal history of other diseases of the digestive system: Secondary | ICD-10-CM

## 2024-05-11 DIAGNOSIS — Z8639 Personal history of other endocrine, nutritional and metabolic disease: Secondary | ICD-10-CM

## 2024-05-11 DIAGNOSIS — Z8669 Personal history of other diseases of the nervous system and sense organs: Secondary | ICD-10-CM

## 2024-05-11 DIAGNOSIS — M24521 Contracture, right elbow: Secondary | ICD-10-CM

## 2024-05-11 DIAGNOSIS — G5701 Lesion of sciatic nerve, right lower limb: Secondary | ICD-10-CM | POA: Diagnosis not present

## 2024-05-11 DIAGNOSIS — Z79899 Other long term (current) drug therapy: Secondary | ICD-10-CM

## 2024-05-11 DIAGNOSIS — R5383 Other fatigue: Secondary | ICD-10-CM | POA: Diagnosis not present

## 2024-05-11 DIAGNOSIS — M47816 Spondylosis without myelopathy or radiculopathy, lumbar region: Secondary | ICD-10-CM

## 2024-05-11 DIAGNOSIS — G8929 Other chronic pain: Secondary | ICD-10-CM

## 2024-05-11 MED ORDER — PREDNISONE 5 MG PO TABS
ORAL_TABLET | ORAL | 0 refills | Status: AC
  Filled 2024-05-11: qty 20, 8d supply, fill #0

## 2024-05-11 MED FILL — Insulin Lispro Inj Soln 100 Unit/ML: INTRAMUSCULAR | 83 days supply | Qty: 100 | Fill #0 | Status: AC

## 2024-05-11 NOTE — Patient Instructions (Signed)
 Standing Labs We placed an order today for your standing lab work.   Please have your standing labs drawn in June and every 3 months   Please have your labs drawn 2 weeks prior to your appointment so that the provider can discuss your lab results at your appointment, if possible.  Please note that you may see your imaging and lab results in MyChart before we have reviewed them. We will contact you once all results are reviewed. Please allow our office up to 72 hours to thoroughly review all of the results before contacting the office for clarification of your results.  WALK-IN LAB HOURS  Monday through Thursday from 8:00 am -12:30 pm and 1:00 pm-4:00 pm and Friday from 8:00 am-12:00 pm.  Patients with office visits requiring labs will be seen before walk-in labs.  You may encounter longer than normal wait times. Please allow additional time. Wait times may be shorter on  Monday and Thursday afternoons.  We do not book appointments for walk-in labs. We appreciate your patience and understanding with our staff.   Labs are drawn by [facility]. Please bring your co-pay at the time of your lab draw.  You may receive a bill from [facility] for your lab work.  Please note if you are on Hydroxychloroquine  and and an order has been placed for a Hydroxychloroquine  level,  you will need to have it drawn 4 hours or more after your last dose.  If you wish to have your labs drawn at another location, please call the office 24 hours in advance so we can fax the orders.  The office is located at [address], [place], [place] [address]   If you have any questions regarding directions or hours of operation,  please call [phone].   As a reminder, please drink plenty of water prior to coming for your lab work. Thanks!

## 2024-05-12 ENCOUNTER — Other Ambulatory Visit: Payer: Self-pay

## 2024-05-14 ENCOUNTER — Other Ambulatory Visit: Payer: Self-pay

## 2024-05-17 MED FILL — Linaclotide Cap 290 MCG: ORAL | 90 days supply | Qty: 90 | Fill #3 | Status: AC

## 2024-05-18 ENCOUNTER — Other Ambulatory Visit: Payer: Self-pay

## 2024-05-19 ENCOUNTER — Other Ambulatory Visit: Payer: Self-pay

## 2024-05-27 ENCOUNTER — Ambulatory Visit (INDEPENDENT_AMBULATORY_CARE_PROVIDER_SITE_OTHER): Admitting: Adult Health

## 2024-05-27 MED FILL — Metoprolol Tartrate Tab 50 MG: ORAL | 90 days supply | Qty: 90 | Fill #1 | Status: AC

## 2024-05-27 MED FILL — Torsemide Tab 20 MG: ORAL | 90 days supply | Qty: 90 | Fill #1 | Status: AC

## 2024-05-28 ENCOUNTER — Other Ambulatory Visit (HOSPITAL_COMMUNITY): Payer: Self-pay

## 2024-05-29 ENCOUNTER — Other Ambulatory Visit: Payer: Self-pay | Admitting: Pharmacy Technician

## 2024-05-29 ENCOUNTER — Other Ambulatory Visit: Payer: Self-pay

## 2024-05-29 ENCOUNTER — Other Ambulatory Visit: Payer: Self-pay | Admitting: Rheumatology

## 2024-05-29 DIAGNOSIS — M0609 Rheumatoid arthritis without rheumatoid factor, multiple sites: Secondary | ICD-10-CM

## 2024-05-29 MED ORDER — RINVOQ 15 MG PO TB24
15.0000 mg | ORAL_TABLET | Freq: Every day | ORAL | 0 refills | Status: DC
  Filled 2024-05-29: qty 30, 30d supply, fill #0
  Filled 2024-06-29: qty 30, 30d supply, fill #1
  Filled 2024-07-28: qty 30, 30d supply, fill #2

## 2024-05-29 NOTE — Telephone Encounter (Signed)
 Last Fill: 02/21/2024  Labs: 03/26/2024 Glucose was 137. Rest of CMP WNL Hemoglobin and hematocrit were low but improving. Rest of CBC WNL.  TB Gold: 10/15/2023 Neg     Next Visit: 10/14/2024  Last Visit: 05/11/2024  DX: Rheumatoid arthritis of multiple sites with negative rheumatoid factor   Current Dose per office note 05/11/2024: Rinvoq  15 mg 1 tablet by mouth once daily   Okay to refill Rinvoq ?

## 2024-05-29 NOTE — Progress Notes (Signed)
 Specialty Pharmacy Refill Coordination Note  [name] is a 58 y.o. female contacted today regarding refills of specialty medication(s) Upadacitinib  (Rinvoq )   Patient requested Delivery   Delivery date: 06/03/24   Verified address: [address] [name] [address]   Medication will be filled on 06/02/24.  This fill date is pending response to refill request from provider. Left Patient voicemail and if they have not received fill by intended date they must follow up with pharmacy.

## 2024-06-01 ENCOUNTER — Encounter: Payer: Self-pay | Admitting: Family Medicine

## 2024-06-09 ENCOUNTER — Other Ambulatory Visit: Payer: Self-pay

## 2024-06-09 ENCOUNTER — Encounter: Payer: Self-pay | Admitting: Internal Medicine

## 2024-06-09 ENCOUNTER — Ambulatory Visit: Payer: Commercial Managed Care - PPO | Admitting: Internal Medicine

## 2024-06-09 VITALS — BP 136/70 | HR 85 | Ht 62.5 in | Wt 182.0 lb

## 2024-06-09 DIAGNOSIS — E0500 Thyrotoxicosis with diffuse goiter without thyrotoxic crisis or storm: Secondary | ICD-10-CM

## 2024-06-09 DIAGNOSIS — E1059 Type 1 diabetes mellitus with other circulatory complications: Secondary | ICD-10-CM

## 2024-06-09 DIAGNOSIS — E66811 Obesity, class 1: Secondary | ICD-10-CM

## 2024-06-09 DIAGNOSIS — E7849 Other hyperlipidemia: Secondary | ICD-10-CM

## 2024-06-09 LAB — POCT GLYCOSYLATED HEMOGLOBIN (HGB A1C): Hemoglobin A1C: 6.1 % — AB (ref 4.0–5.6)

## 2024-06-09 MED ORDER — GLUCAGON 3 MG/DOSE NA POWD
3.0000 mg | Freq: Once | NASAL | 11 refills | Status: DC | PRN
  Filled 2024-06-09: qty 1, 30d supply, fill #0

## 2024-06-09 MED ORDER — TIRZEPATIDE 2.5 MG/0.5ML ~~LOC~~ SOAJ
2.5000 mg | SUBCUTANEOUS | 5 refills | Status: DC
  Filled 2024-06-09 – 2024-06-12 (×3): qty 2, 28d supply, fill #0

## 2024-06-09 NOTE — Progress Notes (Signed)
 Patient ID: [name], female   DOB: 02-18-1966, 58 y.o.   MRN: [id number]  HPI: [name] is a 58 y.o.-year-old female, initially referred by her previous endocrinologist, Dr. Horris Lynn, returning for follow-up for DM1, diagnosed at age 72, fairly well controlled, with complications (CAD, h/o AMI, DR) also h/o Graves disease.  Last visit 4.5 months ago.  Interim history: No increased urination, nausea, chest pain. She goes to weight management.  She gained approximately 6 pounds since last visit.  She was not able to tolerate Ozempic . She had Prednisone  taper for 3-4 weeks ago. Sugars increased to 300s. 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  She will not work nights anymore.  DM1:  Reviewed HbA1c levels: Lab Results  Component Value Date   HGBA1C 6.0 (A) 01/27/2024   HGBA1C 6.3 (A) 09/26/2023   HGBA1C 7.0 (H) 05/15/2022  03/12/2023: HbA1c 6.5%  Insulin  pump:  -Medtronic -Tandem t:slim X2 - since 2022  CGM: -Dexcom G6 >> now G7  Insulin : -Humalog   Supplies: -[place] for both pump and CGM  Pump settings: For days on: Basal rates for days on: 12 AM: 2.00 >> 1.90 >> 1.70 6 AM: 1.80 >> 1.70 11 AM: 2.00  Insulin  to carb ratios: 12 AM: 8 (may need to change to 9) >> 6 11 AM: 8 >> 5  Insulin  correction (sensitivity) factor: 12 AM: 20 6 AM: 20  Blood sugar target: 12 AM: 110   AIT: 5h  For days off: Basal rates for days off: 12 AM: 2.20 >> 2.10 6 AM: 2.70  8 PM: 2.20  Insulin  to carb ratios: 12 AM: 7.5 (may need to change to 8.5)  Insulin  correction (sensitivity) factor: 12 AM: 20  Blood sugar target: 12 AM: 110  Active insulin  time: 3 hrs  Total daily dose = 90.8 units (54% basal, 46% bolus)  - changes infusion site: q3 days TDD from basal insulin : 44% (42.08) >> 56% (42 units) >> 52% (39 units) TDD from bolus insulin : 56% (53.26) >> 44% (33 units) >> 48% (36 units)  Total daily dose: 75-100 units a  day She had Levemir  36 units daily as backup.  She was also on: - Ozempic  1 mg weekly - but severe reflux >> now off  Meter: Freestyle  Pt checks her sugars more than 4 times a day with her Dexcom CGM:  Previously:  Prev.   Lowest sugar was 40 >> 40s >> 47; she has hypoglycemia awareness at 70.  She has a glucagon kit at home. No previous hypoglycemia admission.  Highest sugar was 300 >> Prednisone : 350 >> 300s.+ previous DKA admissions - 2002.    Pt's meals are: - Breakfast: skips or snack - Lunch: apple, cheese with yoghurt - snack: 3 pm - nuts, fruit - Dinner: cauliflower with cheese, beans, rice - Snacks: no sweet drinks; drinks diet coke  - no CKD, last BUN/creatinine:  Lab Results  Component Value Date   BUN 15 03/26/2024   BUN 10 11/26/2023   CREATININE 0.77 03/26/2024   CREATININE 0.75 11/26/2023   Lab Results  Component Value Date   MICRALBCREAT 3.8 09/26/2023  She is on Cozaar  25 mg daily.  - + HL; last set of lipids: Lab Results  Component Value Date   CHOL 162 11/26/2023   HDL 76 11/26/2023   LDLCALC 69 11/26/2023   TRIG 96 11/26/2023   CHOLHDL 2 12/05/2022  On Crestor  10 mg daily along with omega-3 fatty acids.  -  last eye exam was in 01/2024: + PDR OU. Dr. Augustus Ledger. She had B vitrectomies.  She has blurry vision.  Now also seeing Dr. Joseph Nickel ([facility]).  - no numbness and tingling in her feet.  Last foot exam was 01/27/2024.  Pt has FH of DMs in father.  Graves ds.:  - Per review of Dr. Verita Glassman note, she was on methimazole  in 2020 and stopped by herself in 2021.  Subsequent TFTs were normal: Lab Results  Component Value Date   TSH 2.050 11/26/2023   TSH 1.34 06/12/2023   TSH 1.04 03/14/2023   TSH 1.02 12/05/2022   TSH 1.03 05/15/2022   TSH 1.07 01/11/2022   TSH 0.88 12/15/2020   TSH 0.77 05/23/2020   TSH <0.01 Repeated and verified X2. (L) 01/04/2020   TSH 7.635 (H) 08/12/2019   TSH 0.010 (L) 11/19/2018   TSH 1.22 01/28/2017   TSH  3.666 05/08/2016   TSH 0.02 (L) 11/08/2010   Pt denies: - feeling nodules in neck - hoarseness - dysphagia - choking  She also has a history of RA, psoriasis, and OSA. She has a history of chronic pain syndrome.  ROS: + see HPI  Past Medical History:  Diagnosis Date   ACUT MI ANTEROLAT WALL SUBSQT EPIS CARE    Acute maxillary sinusitis    ALLERGIC RHINITIS, SEASONAL    Anemia    Arthritis    ARTHRITIS, RHEUMATOID    shoulders and hands Enbrel>leg swelling Dr. Alvira Josephs   Atrial fibrillation [facility])    a. after CABG.   Back pain    Bruxism (teeth grinding)    CAD, ARTERY BYPASS GRAFT    a. DES to RCA in 2010 then LAD occlusion s/p CABG 3 06/07/2009 with LIMA to LAD, reverse SVG to D1, reverse SVG to distal RCA. b. Cath 05/08/2016 slightly hypodense region in the intermediate branch, however she had excellent flow, FFR was normal. Vein graft to PDA and the posterolateral branch is patent, patent LIMA to LAD, occluded SVG to diagonal.   CARPAL TUNNEL SYNDROME, BILATERAL    CHOLELITHIASIS    Contrast media allergy    DERMATITIS, ALLERGIC    DIABETES MELLITUS, TYPE I    on insulin  pump dx'ed age 74 y.o    DIABETIC  RETINOPATHY    Graves' disease    Heart attack (HCC)    Hiatal hernia    HYPERLIPIDEMIA-MIXED    Hypertension    HYPERTHYROIDISM    Dr. Annell Kidney   IBS (irritable bowel syndrome)    IDA (iron  deficiency anemia)    Infertility, female    Insulin  pump in place    Lymphadenopathy of head and neck    MIGRAINE W/O AURA W/INTRACT W/STATUS MIGRAINOSUS 02/19/2008   PONV (postoperative nausea and vomiting)    Psoriasis    Rheumatoid arthritis (HCC)    SINUS TACHYCARDIA 11/08/2010   Sleep apnea    not using machine yet    Stomach ulcer    SVT (supraventricular tachycardia) (HCC)    after s/p CABG   Tendonitis    TRIGGER FINGER    all fingers b/l hands    URI    Past Surgical History:  Procedure Laterality Date   ABDOMINAL HYSTERECTOMY     endometriomas  b/l; total ? cervix removed; no h/o abnormal pap   Caesarean section     CARDIAC CATHETERIZATION N/A 05/08/2016   Procedure: Left Heart Cath and Cors/Grafts Angiography;  Surgeon: Odie Benne, MD;  Location: [facility];  Service: Cardiovascular;  Laterality: N/A;   CARPAL TUNNEL RELEASE     b/l    CARPAL TUNNEL RELEASE Left 06/06/2021   Procedure: CARPAL TUNNEL RELEASE;  Surgeon: Lyanne Sample, MD;  Location: [facility];  Service: Orthopedics;  Laterality: Left;  AXILLARY BLOCK   CATARACT EXTRACTION, BILATERAL     CHOLECYSTECTOMY     COLONOSCOPY WITH PROPOFOL  N/A 03/25/2020   Procedure: COLONOSCOPY WITH PROPOFOL ;  Surgeon: Luke Salaam, MD;  Location: [facility];  Service: Gastroenterology;  Laterality: N/A;   CORONARY ARTERY BYPASS GRAFT     ESOPHAGOGASTRODUODENOSCOPY (EGD) WITH PROPOFOL  N/A 03/25/2020   Procedure: ESOPHAGOGASTRODUODENOSCOPY (EGD) WITH PROPOFOL ;  Surgeon: Luke Salaam, MD;  Location: [facility];  Service: Gastroenterology;  Laterality: N/A;   EYE SURGERY     laser x 2 for retinopathy    LEFT HEART CATH AND CORS/GRAFTS ANGIOGRAPHY N/A 02/22/2020   Procedure: LEFT HEART CATH AND CORS/GRAFTS ANGIOGRAPHY;  Surgeon: Odie Benne, MD;  Location: [facility];  Service: Cardiovascular;  Laterality: N/A;   TRIGGER FINGER RELEASE     b/l fingers all    ULNAR NERVE TRANSPOSITION Left 06/06/2021   Procedure: ULNAR NERVE DECOMPRESSION;  Surgeon: Lyanne Sample, MD;  Location: [facility];  Service: Orthopedics;  Laterality: Left;  AXILLARY BLOCK   VITRECTOMY     b/l    Social History   Socioeconomic History   Marital status: Divorced    Spouse name: Not on file   Number of children: Not on file   Years of education: Not on file   Highest education level: Not on file  Occupational History   Not on file  Tobacco Use   Smoking status: Never    Passive exposure: Never   Smokeless tobacco: Never  Vaping Use   Vaping  status: Never Used  Substance and Sexual Activity   Alcohol use: Yes    Comment: rarely 1 every 6 months   Drug use: No   Sexual activity: Not Currently    Partners: Male    Birth control/protection: None  Other Topics Concern   Not on file  Social History Narrative   Divorced. Has 2 kids(fraternal twins) daughters age 54. Works at  [employer], Never smoked, denies ETOH, no drugs. Drinks diet coke. No exercise.       DPR daughters Lovett Ruck and Zema Lizardo twins          Social Drivers of Health   Financial Resource Strain: Not on file  Food Insecurity: No Food Insecurity (05/22/2022)   Received from [facility], [facility]   Hunger Vital Sign    Worried About Running Out of Food in the Last Year: Never true    Ran Out of Food in the Last Year: Never true  Transportation Needs: Not on file  Physical Activity: Not on file  Stress: Not on file  Social Connections: Unknown (05/09/2022)   Received from [facility], [facility]   Social Network    Social Network: Not on file  Intimate Partner Violence: Unknown (04/03/2022)   Received from [facility], [facility]   HITS    Physically Hurt: Not on file    Insult or Talk Down To: Not on file    Threaten Physical Harm: Not on file    Scream or Curse: Not on file   Current Outpatient Medications on File Prior to Visit  Medication Sig Dispense Refill   acetaminophen -codeine  (TYLENOL  #3) 300-30 MG tablet Take 1 tablet by  mouth 2 (two) times daily as needed for moderate pain (pain score 4-6). 60 tablet 3   albuterol  (VENTOLIN  HFA) 108 (90 Base) MCG/ACT inhaler Inhale 2 puffs into the lungs every 4 (four) hours as needed.     Albuterol -Budesonide  (AIRSUPRA ) 90-80 MCG/ACT AERO Inhale 2 puffs into the lungs every 6 (six) hours. 10.7 g 3   Ascorbic Acid 500 MG CAPS Take by mouth.     aspirin  EC 81 MG tablet      Blood Glucose Monitoring Suppl (FREESTYLE FREEDOM LITE) w/Device KIT Use as directed by physician to check blood  sugar 1 kit 0   CHELATED MAGNESIUM PO Take 250 mg by mouth daily.     Cholecalciferol (VITAMIN D3) 125 MCG (5000 UT) CAPS Decrease to 1 tab every other day     clopidogrel  (PLAVIX ) 75 MG tablet TAKE 1 TABLET BY MOUTH ONCE DAILY 90 tablet 2   Cyanocobalamin  (VITAMIN B-12 PO) Take by mouth.     cyclobenzaprine  (FLEXERIL ) 10 MG tablet Take 1 tablet (10 mg total) by mouth at bedtime as needed for muscle spasms. 30 tablet 2   EPINEPHrine  0.3 mg/0.3 mL IJ SOAJ injection Inject 0.3 mg into the muscle as needed for anaphylaxis. 2 each 2   fexofenadine  (ALLEGRA ) 180 MG tablet Take 1 tablet (180 mg total) by mouth daily. 90 tablet 1   fluticasone  (FLONASE ) 50 MCG/ACT nasal spray Place 2 sprays into both nostrils daily. 16 g 0   fluticasone -salmeterol (ADVAIR ) 100-50 MCG/ACT AEPB Inhale 1 puff into the lungs 2 (two) times daily. 60 each 1   glucose blood (FREESTYLE LITE) test strip Use to check blood sugar 3 time(s) daily 100 each 3   glucose blood test strip      guaiFENesin -codeine  100-10 MG/5ML syrup Take 5 mLs by mouth at bedtime. (Patient not taking: Reported on 05/11/2024) 118 mL 0   insulin  detemir (LEVEMIR ) 100 UNIT/ML injection Inject 36 units into the skin once daily in the event of insulin  pump failure. 10 mL 0   insulin  lispro (HUMALOG ) 100 UNIT/ML injection Inject 0.9-1.2 mLs (90-120 Units total) into the skin daily. 60 mL 3   Insulin  Syringe-Needle U-100 (BD INSULIN  SYRINGE U/F) 31G X 5/16" 0.3 ML MISC use with insulin  injections 3 times daily in the event of insulin  pump failure. 100 each 12   ipratropium (ATROVENT ) 0.06 % nasal spray Place 2 sprays into both nostrils 4 (four) times daily. 15 mL 12   isosorbide  mononitrate (IMDUR ) 60 MG 24 hr tablet Take 1 tablet (60 mg total) by mouth daily. 90 tablet 1   Krill Oil 500 MG CAPS Take 500 mg by mouth daily.     Lancets (FREESTYLE) lancets Use to check blood sugar 3 time(s) daily. 100 each 99   leflunomide  (ARAVA ) 20 MG tablet Take 1 tablet (20  mg total) by mouth daily. 90 tablet 0   linaclotide  (LINZESS ) 290 MCG CAPS capsule Take 1 capsule (290 mcg total) by mouth daily before breakfast. 90 capsule 3   losartan  (COZAAR ) 100 MG tablet Take 1 tablet (100 mg total) by mouth daily. 90 tablet 1   metoprolol  tartrate (LOPRESSOR ) 50 MG tablet Take 0.5 tablets (25 mg total) by mouth 2 (two) times daily. 90 tablet 1   montelukast  (SINGULAIR ) 10 MG tablet Take 1 tablet (10 mg total) by mouth at bedtime. 90 tablet 3   Multiple Vitamin (MULTIVITAMIN) capsule Take 1 capsule by mouth daily.     neomycin -polymyxin b-dexamethasone  (MAXITROL ) 3.5-10000-0.1 SUSP Place 1  drop into both eyes 4 (four) times daily. (Patient not taking: Reported on 05/11/2024) 5 mL 0   ondansetron  (ZOFRAN ) 4 MG tablet Take 1 tablet (4 mg total) by mouth every 8 (eight) hours as needed for nausea or vomiting. (Patient taking differently: Take 4 mg by mouth as needed for nausea or vomiting.) 90 tablet 5   pantoprazole  (PROTONIX ) 40 MG tablet Take 1 tablet (40 mg total) by mouth 2 (two) times daily. 180 tablet 3   potassium chloride  SA (KLOR-CON  M20) 20 MEQ tablet Take 1 tablet (20 mEq total) by mouth daily. 90 tablet 3   rosuvastatin  (CRESTOR ) 20 MG tablet Take 1 tablet (20 mg total) by mouth daily. 90 tablet 1   torsemide  (DEMADEX ) 20 MG tablet Take 1 tablet (20 mg total) by mouth daily. 90 tablet 1   Turmeric (QC TUMERIC COMPLEX PO) Take 1,000 mg by mouth.     Upadacitinib  ER (RINVOQ ) 15 MG TB24 Take 1 tablet (15 mg) by mouth daily. 90 tablet 0   Current Facility-Administered Medications on File Prior to Visit  Medication Dose Route Frequency Provider Last Rate Last Admin   lidocaine  (XYLOCAINE ) 1 % (with pres) injection 6 mL  6 mL Other Once Lovorn, Megan, MD       lidocaine  (XYLOCAINE ) 1 % (with pres) injection 6 mL  6 mL Other Once        lidocaine  (XYLOCAINE ) 1 % (with pres) injection 6 mL  6 mL Other Once        Allergies  Allergen Reactions   Actemra  [Tocilizumab ]     Ramipril Swelling, Rash and Other (See Comments)   Shellfish-Derived Products Swelling    Shrimp    Atorvastatin Rash    Elevated LFT's   Compazine  [Prochlorperazine Edisylate] Other (See Comments)    Neurological reaction   Emgality  [Galcanezumab -Gnlm] Hives and Swelling   Etanercept Swelling and Rash   Infliximab Rash   Iohexol      Iv contrast dye -rash all over   Orencia [Abatacept] Rash   Prochlorperazine Edisylate     unknown   Tofacitinib Rash and Other (See Comments)    Severe abdominal pain    Trokendi  Xr [Topiramate  Er]     Brain fog, memory issues, word finding issues   Shrimp Extract Other (See Comments)   Topiramate  Other (See Comments)   Tramadol      Nausea    Amiodarone Nausea Only   Rituximab Rash    Causes a rash   Family History  Problem Relation Age of Onset   Depression Mother    Anxiety disorder Mother    Hypertension Mother    Hyperlipidemia Mother    Thyroid  disease Mother    Bipolar disorder Mother    Sleep apnea Mother    Eating disorder Mother    Colon polyps Father    Diabetes Father        2   Hypertension Father    Parkinson's disease Father    Hyperlipidemia Father    Depression Father    Sleep apnea Father    Obesity Father    Stroke Paternal Grandmother    Heart disease Other    Hypertension Other    Hyperlipidemia Other    Depression Other    Migraines Other    Heart attack Neg Hx    Colon cancer Neg Hx    Stomach cancer Neg Hx    Esophageal cancer Neg Hx    PE: BP 136/70   Pulse 85  Ht 5' 2.5" (1.588 m)   Wt 182 lb (82.6 kg)   SpO2 96%   BMI 32.76 kg/m  Wt Readings from Last 10 Encounters:  06/09/24 182 lb (82.6 kg)  05/11/24 180 lb (81.6 kg)  05/01/24 177 lb (80.3 kg)  04/23/24 178 lb 9.6 oz (81 kg)  04/21/24 179 lb (81.2 kg)  04/15/24 172 lb (78 kg)  03/17/24 171 lb (77.6 kg)  02/18/24 179 lb 12.8 oz (81.6 kg)  02/13/24 176 lb 9.6 oz (80.1 kg)  02/10/24 179 lb (81.2 kg)   Constitutional:  overweight, in NAD Eyes:  EOMI, no exophthalmos ENT: no neck masses, no cervical lymphadenopathy Cardiovascular: RRR, No MRG Respiratory: CTA B Musculoskeletal: no deformities Skin:no rashes Neurological: no tremor with outstretched hands  ASSESSMENT: 1. DM1, controlled, with complications - CAD, h/o AMI - at 72, s/p stent, s/p CABG - DR, s/p vitrectomy  2. HL  3.  Graves' disease, in remission  4. Obesity class 1  PLAN:  1. Patient with longstanding fairly well-controlled type 1 diabetes, previously on the Medtronic insulin  pump for years but now doing well on the t:slim X2 insulin  pump integrated with the Dexcom CGM, started 2022.  HbA1c at last visit was lower, at 6.0%.  Sugars are well-controlled, fluctuating within the target range with only mild occasional high blood sugars especially after dinner.  Overall, she had exemplary control for type 1 diabetes.  She had occasional low blood sugars overnight especially when she was working but sometimes even during the weekend when she was not working.  I advised her to reduce the basal rate from 12 AM to 6 AM.  We discussed that if she continues to see low blood sugars afterwards especially during her weight loss journey, to relax her insulin  to carb ratios.  I again underlined the need to bolus 15 minutes before meals. - Of note, she has 2 sets of pump settings: 1 for when she is working (2 days a week) and 1 for when she is not working (5 days a week) CGM interpretation: -At today's visit, we reviewed her CGM downloads: It appears that 89% of values are in target range (goal >70%), while 8.9% are higher than 180 (goal <25%), and 1.9% are lower than 70 (goal <4%).  The calculated average blood sugar is 128.  The projected HbA1c for the next 3 months (GMI) is 6 4%. -Reviewing the CGM trends, sugars are excellent, mainly fluctuating within the target range with only occasional mild hyperglycemic exceptions after meals, possibly due to  prednisone  treatment.  She did adjust her basal rates and strengthen her insulin  to carb ratio since last visit, which we can continue now.  Especially as she is preparing to change to working only days, we will not change her pump settings today.  However, she is interested in trying a GLP-1 receptor agonist.  As she could not tolerate Ozempic  in the past due to GI symptoms, we discussed about possibly trying a low-dose Mounjaro.  I am hoping this is covered for her.  I did advise her that she probably will need to relax her insulin  to carb ratios if sugars improve after starting Mounjaro. - I suggested to: Patient Instructions  Please use the following pump settings: Basal rates: 12 AM: 1.70 6 AM: 1.70 11 AM: 2.00  Insulin  to carb ratios: 12 AM: 6 11 AM: 5  Insulin  correction (sensitivity) factor: 12 AM: 20 6 AM: 20  Blood sugar target: 12 AM: 110  Active insulin  time: 5 hrs  Please try Mounjaro 2.5 mg weekly.  Please return in 6 months.  - we checked her HbA1c: 6.1% (only slightly higher) - advised to check sugars at different times of the day - 4x a day, rotating check times - advised for yearly eye exams >> she is UTD - return to clinic in 6 months  2. HL - Latest lipid panel was at goal: Lab Results  Component Value Date   CHOL 162 11/26/2023   HDL 76 11/26/2023   LDLCALC 69 11/26/2023   TRIG 96 11/26/2023   CHOLHDL 2 12/05/2022  - She continues on Crestor  10 mg daily and omega-3 fatty acids without side effects  3. Graves' disease, in remission - She currently is off methimazole  - Latest TSH was normal: Lab Results  Component Value Date   TSH 2.050 11/26/2023  - No thyrotoxic symptoms at today's visit  4.  Obesity class I -She was not able to tolerate Ozempic  due to reflux -Reviewing her diet today, she appears to have smaller meals, so I would not recommend to decrease them even more, but redistribute the meals with a lighter meal at night. - Currently  seen in the weight management clinic - She gained 6 pounds since last visit.  I am hoping that she can start on Mounjaro.  I recommended a low dose and increase as tolerated.  Emilie Harden, MD PhD [facility] Endocr[facility]

## 2024-06-09 NOTE — Patient Instructions (Addendum)
 Please use the following pump settings: Basal rates: 12 AM: 1.70 6 AM: 1.70 11 AM: 2.00  Insulin  to carb ratios: 12 AM: 6 11 AM: 5  Insulin  correction (sensitivity) factor: 12 AM: 20 6 AM: 20  Blood sugar target: 12 AM: 110   Active insulin  time: 5 hrs  Please try Mounjaro 2.5 mg weekly.  Please return in 6 months.

## 2024-06-10 ENCOUNTER — Ambulatory Visit: Payer: Self-pay | Admitting: Internal Medicine

## 2024-06-10 LAB — MICROALBUMIN / CREATININE URINE RATIO
Creatinine, Urine: 18 mg/dL — ABNORMAL LOW (ref 20–275)
Microalb Creat Ratio: 11 mg/g{creat}
Microalb, Ur: 0.2 mg/dL

## 2024-06-11 ENCOUNTER — Other Ambulatory Visit: Payer: Self-pay

## 2024-06-11 ENCOUNTER — Telehealth: Payer: Self-pay

## 2024-06-11 NOTE — Telephone Encounter (Signed)
 Pharmacy Patient Advocate Encounter   Received notification from CoverMyMeds that prior authorization for Mounjaro is required/requested.   Insurance verification completed.   The patient is insured through [facility] .   Mounjaro is approved exclusively as an adjunct to diet and exercise to improve glycemic  control in adults with type 2 diabetes mellitus. A review of patient's medical chart reveals no  documented diagnosis of type 2 diabetes. Therefore, they do not  currently meet the criteria for prior authorization of this medication. If clinically appropriate, alternative  options such as Saxenda, Zepbound, or Wegovy  may be considered for this patient.

## 2024-06-12 ENCOUNTER — Encounter
Payer: Commercial Managed Care - PPO | Attending: Physical Medicine and Rehabilitation | Admitting: Physical Medicine and Rehabilitation

## 2024-06-12 ENCOUNTER — Encounter: Payer: Self-pay | Admitting: Internal Medicine

## 2024-06-12 ENCOUNTER — Encounter: Payer: Self-pay | Admitting: Physical Medicine and Rehabilitation

## 2024-06-12 ENCOUNTER — Other Ambulatory Visit: Payer: Self-pay | Admitting: Internal Medicine

## 2024-06-12 ENCOUNTER — Other Ambulatory Visit: Payer: Self-pay

## 2024-06-12 VITALS — BP 139/78 | HR 64 | Ht 62.5 in | Wt 174.0 lb

## 2024-06-12 DIAGNOSIS — M7918 Myalgia, other site: Secondary | ICD-10-CM | POA: Diagnosis present

## 2024-06-12 DIAGNOSIS — G894 Chronic pain syndrome: Secondary | ICD-10-CM | POA: Insufficient documentation

## 2024-06-12 DIAGNOSIS — Z5181 Encounter for therapeutic drug level monitoring: Secondary | ICD-10-CM | POA: Diagnosis present

## 2024-06-12 DIAGNOSIS — Z79891 Long term (current) use of opiate analgesic: Secondary | ICD-10-CM | POA: Insufficient documentation

## 2024-06-12 MED ORDER — LIDOCAINE HCL 1 % IJ SOLN
6.0000 mL | Freq: Once | INTRAMUSCULAR | Status: AC
  Administered 2024-06-12: 6 mL

## 2024-06-12 NOTE — Patient Instructions (Addendum)
 Plan: Patient here for trigger point injections for  Consent done and on chart.  Cleaned areas with alcohol and injected using a 27 gauge 1.5 inch needle  Injected  4.5cc- 1.5 wasted Using 1% Lidocaine  with no EPI  Upper traps B/L  Levators- B/L  Posterior scalenes Middle scalenes- B/L  Splenius Capitus- B/L Pectoralis Major Rhomboids- B/L x3 Infraspinatus Teres Major/minor Thoracic paraspinals Lumbar paraspinals- B/L  Other injections-    Patient's level of pain prior was 7-8/10 Current level of pain after injections is neck still tight/but shoulders a little better  There was no bleeding or complications.  Patient was advised to drink a lot of water on day after injections to flush system Will have increased soreness for 12-48 hours after injections.  Can use Lidocaine  patches the day AFTER injections Can use theracane on day of injections in places didn't inject Can use heating pad 4-6 hours AFTER injections  2.   Don't need a refill On tylenol  #3- right now- but will continue the medicine.   3. Allergic to Duloxetine  and tramadol .  Last UDS was 02/11/24  4.  F/U in 6 weeks- trp Injections. And f/u on pain  5. Will ask pt to use theracane 10-15 minutes 2-3x/week

## 2024-06-12 NOTE — Progress Notes (Signed)
 Pt is a 58 yr old female with hx of RA, (-) rh factor, thoracic DDD, DM1 with retinopathy- Ac 6.5, migraines without aura, and MI/CABG in past-  Also has Graves disease; and just dx'd with tendinitis in B/L hamstring tendinosis- Here for f/u of chronic pain and trigger point injections .               Same old, same old.   No major changes in pain levels  Takes 1/2 tab 2-3x/week of Tylenol  #3.   Took a full tab and didn't lay down- puked. Didn't take Zofran .    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     Plan: Patient here for trigger point injections for  Consent done and on chart.  Cleaned areas with alcohol and injected using a 27 gauge 1.5 inch needle  Injected  4.5cc- 1.5 wasted Using 1% Lidocaine  with no EPI  Upper traps B/L  Levators- B/L  Posterior scalenes Middle scalenes- B/L  Splenius Capitus- B/L Pectoralis Major Rhomboids- B/L x3 Infraspinatus Teres Major/minor Thoracic paraspinals Lumbar paraspinals- B/L  Other injections-    Patient's level of pain prior was 7-8/10 Current level of pain after injections is neck still tight/but shoulders a little better  There was no bleeding or complications.  Patient was advised to drink a lot of water on day after injections to flush system Will have increased soreness for 12-48 hours after injections.  Can use Lidocaine  patches the day AFTER injections Can use theracane on day of injections in places didn't inject Can use heating pad 4-6 hours AFTER injections  2.   Don't need a refill On tylenol  #3- right now- but will continue the medicine.   3. Allergic to Duloxetine  and tramadol .  Last UDS was 02/11/24  4.  F/U in 6 weeks- trp Injections. And f/u on pain  5. D/w pt about use of theracane- please use 2-3x/week- 10-15 minutes at a time.   I spent a total of  26   minutes on total care today-  >50% coordination of care- due to 6 minutes on trP injections- rest educating on theracane and usage and d/w pt her meds/and allergies

## 2024-06-13 ENCOUNTER — Other Ambulatory Visit: Payer: Self-pay

## 2024-06-15 ENCOUNTER — Other Ambulatory Visit: Payer: Self-pay

## 2024-06-16 ENCOUNTER — Other Ambulatory Visit: Payer: Self-pay

## 2024-06-17 ENCOUNTER — Other Ambulatory Visit: Payer: Self-pay

## 2024-06-17 MED ORDER — ZEPBOUND 2.5 MG/0.5ML ~~LOC~~ SOAJ
2.5000 mg | SUBCUTANEOUS | 3 refills | Status: DC
  Filled 2024-06-17 – 2024-07-02 (×2): qty 6, 84d supply, fill #0

## 2024-06-17 NOTE — Addendum Note (Signed)
 Addended by: Vernon Goodpasture on: 06/17/2024 09:50 AM   Modules accepted: Orders

## 2024-06-18 ENCOUNTER — Other Ambulatory Visit: Payer: Self-pay

## 2024-06-19 ENCOUNTER — Other Ambulatory Visit: Payer: Self-pay

## 2024-06-22 ENCOUNTER — Other Ambulatory Visit: Payer: Self-pay

## 2024-06-23 ENCOUNTER — Other Ambulatory Visit: Payer: Self-pay

## 2024-06-24 ENCOUNTER — Encounter (INDEPENDENT_AMBULATORY_CARE_PROVIDER_SITE_OTHER): Payer: Self-pay | Admitting: Family Medicine

## 2024-06-24 ENCOUNTER — Other Ambulatory Visit: Payer: Self-pay | Admitting: *Deleted

## 2024-06-24 ENCOUNTER — Other Ambulatory Visit: Payer: Self-pay

## 2024-06-24 ENCOUNTER — Ambulatory Visit (INDEPENDENT_AMBULATORY_CARE_PROVIDER_SITE_OTHER): Admitting: Family Medicine

## 2024-06-24 ENCOUNTER — Other Ambulatory Visit: Payer: Self-pay | Admitting: Physician Assistant

## 2024-06-24 ENCOUNTER — Other Ambulatory Visit: Payer: Self-pay | Admitting: Rheumatology

## 2024-06-24 VITALS — BP 135/71 | HR 80 | Temp 98.5°F | Ht 62.5 in | Wt 174.0 lb

## 2024-06-24 DIAGNOSIS — Z6831 Body mass index (BMI) 31.0-31.9, adult: Secondary | ICD-10-CM

## 2024-06-24 DIAGNOSIS — Z79899 Other long term (current) drug therapy: Secondary | ICD-10-CM | POA: Diagnosis not present

## 2024-06-24 DIAGNOSIS — E669 Obesity, unspecified: Secondary | ICD-10-CM | POA: Diagnosis not present

## 2024-06-24 DIAGNOSIS — K581 Irritable bowel syndrome with constipation: Secondary | ICD-10-CM

## 2024-06-24 DIAGNOSIS — E108 Type 1 diabetes mellitus with unspecified complications: Secondary | ICD-10-CM

## 2024-06-24 DIAGNOSIS — E1059 Type 1 diabetes mellitus with other circulatory complications: Secondary | ICD-10-CM | POA: Diagnosis not present

## 2024-06-24 DIAGNOSIS — I152 Hypertension secondary to endocrine disorders: Secondary | ICD-10-CM | POA: Diagnosis not present

## 2024-06-24 LAB — CBC WITH DIFFERENTIAL/PLATELET
Absolute Lymphocytes: 1567 {cells}/uL (ref 850–3900)
Absolute Monocytes: 416 {cells}/uL (ref 200–950)
Basophils Absolute: 29 {cells}/uL (ref 0–200)
Basophils Relative: 0.7 %
Eosinophils Absolute: 50 {cells}/uL (ref 15–500)
Eosinophils Relative: 1.2 %
HCT: 35.2 % (ref 35.0–45.0)
Hemoglobin: 11.8 g/dL (ref 11.7–15.5)
MCH: 29.7 pg (ref 27.0–33.0)
MCHC: 33.5 g/dL (ref 32.0–36.0)
MCV: 88.7 fL (ref 80.0–100.0)
MPV: 9.4 fL (ref 7.5–12.5)
Monocytes Relative: 9.9 %
Neutro Abs: 2138 {cells}/uL (ref 1500–7800)
Neutrophils Relative %: 50.9 %
Platelets: 234 10*3/uL (ref 140–400)
RBC: 3.97 10*6/uL (ref 3.80–5.10)
RDW: 12.6 % (ref 11.0–15.0)
Total Lymphocyte: 37.3 %
WBC: 4.2 10*3/uL (ref 3.8–10.8)

## 2024-06-24 MED ORDER — GVOKE HYPOPEN 2-PACK 0.5 MG/0.1ML ~~LOC~~ SOAJ
0.5000 mg | SUBCUTANEOUS | 5 refills | Status: AC | PRN
  Filled 2024-06-24: qty 0.2, 2d supply, fill #0

## 2024-06-24 MED ORDER — LEFLUNOMIDE 20 MG PO TABS
20.0000 mg | ORAL_TABLET | Freq: Every day | ORAL | 0 refills | Status: DC
  Filled 2024-06-24: qty 90, 90d supply, fill #0

## 2024-06-24 MED FILL — Isosorbide Mononitrate Tab ER 24HR 60 MG: ORAL | 90 days supply | Qty: 90 | Fill #1 | Status: AC

## 2024-06-24 NOTE — Progress Notes (Signed)
 [name], D.O.  ABFM, ABOM Specializing in Clinical Bariatric Medicine  Office located at: [address] [place], [place]  [address]   Assessment and Plan:  No orders of the defined types were placed in this encounter.  There are no discontinued medications.   No orders of the defined types were placed in this encounter.    FOR THE DISEASE OF OBESITY:  Obesity (BMI 30-39.9), Starting BMI 11/26/23 32.02 BMI 31.0-31.9,adult - 31.32 Assessment & Plan: Since last office visit on 04/15/24 [name]'s muscle mass has decreased by 0.2 lb. Fat mass has increased by 2.2 lb. Total body water has increased by 3.8 lb.  Counseling done on how various foods will affect these numbers and how to maximize success  Total lbs lost to date: 4 lbs Total weight loss percentage to date: -2.25%    Recommended Dietary Goals [name] is currently in the action stage of change. As such, [name] goal is to continue weight management plan.  [name] has agreed to: continue current plan   Behavioral Intervention We discussed the following today: increasing lean protein intake to established goals, decreasing simple carbohydrates , avoiding skipping meals, increasing water intake , work on tracking and journaling calories using tracking application, decreasing eating out or consumption of processed foods, and making healthy choices when eating convenient foods, and work on managing stress, creating time for self-care and relaxation  Additional resources provided today: Hand out on NEAT strategies  Evidence-based interventions for health behavior change were utilized today including the discussion of self monitoring techniques, problem-solving barriers and SMART goal setting techniques.   Regarding [name]'s less desirable eating habits and patterns, we employed the technique of small changes.   Pt will specifically work on: Increase NEAT  for next visit.    Recommended Physical Activity Goals [name]  has been advised to work up to 300-450 minutes of moderate intensity aerobic activity a week and strengthening exercises 2-3 times per week for cardiovascular health, weight loss maintenance and preservation of muscle mass.   [name] has agreed to: Increase physical activity in their day and reduce sedentary time (increase NEAT). and Increase the intensity, frequency or duration of aerobic exercises     Pharmacotherapy We both agreed to: Continue with current nutritional and behavioral strategies and Zepbound  2.5 mg once weekly.   ASSOCIATED CONDITIONS ADDRESSED TODAY:  Type 1 diabetes mellitus with complications [facility]) Assessment & Plan: Lab Results  Component Value Date   HGBA1C 6.1 (A) 06/09/2024   HGBA1C 6.0 (A) 01/27/2024   HGBA1C 6.3 (A) 09/26/2023   INSULIN  <0.4 (L) 11/26/2023    Last A1c was 6.1 on 06/09/2024, increased from prior A1c of 6.0 on 01/27/24. Pt is on an insulin  pump.  [name] is prescribed Glucagon , but keeps it for emergencies given [name] has not needed it for several years. [name] reports [name] blood glucose levels appeared to be more elevated while working at [name] previously job, which [name] attributes to the high-stress environment. [name] recently followed up with [name] endocrinologist and at the time [name] medications were adjusted and decreased; however, this occurred prior to starting [name] current job. Continue current medication regimen. I recommend [name] discuss future medication adjustments with [name] endocrinologist, given [name] has seen changes in [name] sugars since starting [name] new job. Encouraged pt to follow [name] meal plan and increase NEAT to improve [name] condition.     Hypertension associated with diabetes (HCC) Assessment & Plan: BP Readings from Last 3 Encounters:  06/24/24 135/71  06/12/24 139/78  06/09/24 136/70   Lab Results  Component Value Date   CHOL 162 11/26/2023   HDL 76 11/26/2023   LDLCALC 69 11/26/2023   TRIG 96 11/26/2023   CHOLHDL 2 12/05/2022   BP is at  goal. [name] is taking Imdur  60 mg once daily, losartan  100 mg once daily, and Lopressor  25 mg BID. Tolerating well with no adverse SE. Reviewed [name] lipid panel today; cholesterol components are all WNL. Continue current antihypertensive regimen as prescribed. Encourage [name] to continue working on heart-healthy diet including avoiding high sodium foods, avoid saturated and trans fats, decreasing simple carbs/sugars, increasing water intake, and gradually increase intensity of exercise. Will continue monitoring as it relates to [name] weight loss.     Irritable bowel syndrome with constipation Assessment & Plan: Pt has hx of IBS with constipation. Has been taking Linzess  daily, although in the past few days [name] has needed to take additional medication to help with [name] constipation; pt was uncertain of exact medication name. [name] last bowel movement was this morning. Encouraged pt to increase [name] water intake and increase fiber intake. Recommended pt take psyllium husks or Miralax as needed. Continue with Linzess  as prescribed.     Follow up:   Return in about 6 weeks (around 08/05/2024) for 4-6 week f/u.  [name] was informed of the importance of frequent follow up visits to maximize [name] success with intensive lifestyle modifications for [name] multiple health conditions.  Subjective:   Chief complaint: Obesity [name] is [name] to discuss [name] progress with [name] obesity treatment plan. [name] is on the Vegetarian Plan and states [name] is following [name] eating plan approximately 0% of the time. [name] states [name] is walking 30 minutes 3 days per week.   Interval History:  [name] for a follow up office visit. Since last OV on 04/15/2024,  [name] gained 2 lbs. [name] has been focusing on eating more lean protein but overall has struggled with staying on plan. [name] has not been meeting [name] daily calorie goal. 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 [name] notes [name]  sugars seemed to be more elevated while working at [name] old job, which [name] attributes to the high-stress environment [name] was in. [name] was seen by [name] endocrinologist and discussed adjusting [name] medications, which were decreased, but notes this was prior to [name] starting [name] new job.   Of note, pt was on prednisone  for 2 weeks for a rheumatoid flare up.   Pharmacotherapy for weight loss: [name] is currently taking no anti-obesity medication.   Review of Systems:  Pertinent positives were addressed with [name] today.  Reviewed by clinician on day of visit: allergies, medications, problem list, medical history, surgical history, family history, social history, and previous encounter notes.  Weight Summary and Biometrics   Weight Lost Since Last Visit: 0lb  Weight Gained Since Last Visit: 2lb   Vitals Temp: 98.5 F (36.9 C) BP: 135/71 Pulse Rate: 80 SpO2: 98 %   Anthropometric Measurements Height: 5' 2.5 (1.588 m) Weight: 174 lb (78.9 kg) BMI (Calculated): 31.3 Weight at Last Visit: 172lb Weight Lost Since Last Visit: 0lb Weight Gained Since Last Visit: 2lb Starting Weight: 178lb Total Weight Loss (lbs): 3 lb (1.361 kg)   Body Composition  Body Fat %: 44.6 % Fat Mass (lbs): 77.6 lbs Muscle Mass (lbs): 91.6 lbs Total Body Water (lbs): 74.6 lbs Visceral Fat Rating : 11  Other Clinical Data Fasting: No Labs: No Today's Visit #: 6 Starting Date: 11/26/23    Objective:   PHYSICAL EXAM: Blood pressure 135/71, pulse 80, temperature 98.5 F (36.9 C), height 5' 2.5 (1.588 m), weight 174 lb (78.9 kg), SpO2 98%. Body mass index is 31.32 kg/m.  General: [name] is overweight, cooperative and in no acute distress. PSYCH: Has normal mood, affect and thought process.   HEENT: EOMI, sclerae are anicteric. Lungs: Normal breathing effort, no conversational dyspnea. Extremities: Moves * 4 Neurologic: A and O * 3, good insight  DIAGNOSTIC DATA REVIEWED: BMET    Component  Value Date/Time   NA 135 03/26/2024 1448   NA 140 11/26/2023 1123   K 3.9 03/26/2024 1448   CL 99 03/26/2024 1448   CO2 27 03/26/2024 1448   GLUCOSE 137 (H) 03/26/2024 1448   BUN 15 03/26/2024 1448   BUN 10 11/26/2023 1123   CREATININE 0.77 03/26/2024 1448   CALCIUM  8.9 03/26/2024 1448   GFRNONAA >60 06/01/2021 0757   GFRNONAA 74 04/07/2021 0855   GFRAA 86 04/07/2021 0855   Lab Results  Component Value Date   HGBA1C 6.1 (A) 06/09/2024   HGBA1C 6.8 06/26/2007   Lab Results  Component Value Date   INSULIN  <0.4 (L) 11/26/2023   Lab Results  Component Value Date   TSH 2.050 11/26/2023   CBC    Component Value Date/Time   WBC 4.2 03/26/2024 1448   RBC 3.89 03/26/2024 1448   HGB 11.6 (L) 03/26/2024 1448   HGB 11.1 (L) 09/18/2023 1447   HGB 13.4 02/15/2020 1656   HCT 33.9 (L) 03/26/2024 1448   HCT 38.9 02/15/2020 1656   PLT 217 03/26/2024 1448   PLT 188 09/18/2023 1447   PLT 172 02/15/2020 1656   MCV 87.1 03/26/2024 1448   MCV 87 02/15/2020 1656   MCH 29.8 03/26/2024 1448   MCHC 34.2 03/26/2024 1448   RDW 12.4 03/26/2024 1448   RDW 12.8 02/15/2020 1656   Iron  Studies    Component Value Date/Time   IRON  83 09/18/2023 1447   TIBC 406 09/18/2023 1447   FERRITIN 58 09/18/2023 1447   IRONPCTSAT 20 09/18/2023 1447   Lipid Panel     Component Value Date/Time   CHOL 162 11/26/2023 1123   TRIG 96 11/26/2023 1123   HDL 76 11/26/2023 1123   CHOLHDL 2 12/05/2022 1558   VLDL 19.0 12/05/2022 1558   LDLCALC 69 11/26/2023 1123   Hepatic Function Panel     Component Value Date/Time   PROT 6.2 03/26/2024 1448   PROT 6.1 11/26/2023 1123   ALBUMIN 4.4 11/26/2023 1123   AST 21 03/26/2024 1448   ALT 17 03/26/2024 1448   ALKPHOS 76 11/26/2023 1123   BILITOT 0.4 03/26/2024 1448   BILITOT 0.3 11/26/2023 1123   BILIDIR 0.1 11/08/2010 1257   IBILI 0.4 07/05/2009 0743      Component Value Date/Time   TSH 2.050 11/26/2023 1123   Nutritional Lab Results  Component  Value Date   VD25OH 92.9 11/26/2023   VD25OH 74.19 12/05/2022   VD25OH 80.30 05/15/2022    Attestations:   I, Vernell Forest, acting as a Stage manager for Lisa [name], DO., have compiled all relevant documentation for today's office visit on behalf of Lisa [name], DO, while in the presence of [employer], DO.  I have reviewed the above documentation for accuracy and completeness, and I agree with the above. Lisa JINNY [name], D.O.  The 21st Century Cures Act  was signed into law in 2016 which includes the topic of electronic health records.  This provides immediate access to information in MyChart.  This includes consultation notes, operative notes, office notes, lab results and pathology reports.  If you have any questions about what you read please let us  know at your next visit so we can discuss your concerns and take corrective action if need be.  We are right [name] with you.

## 2024-06-24 NOTE — Telephone Encounter (Signed)
 Last Fill: 03/24/2024  Labs: 03/26/2024 Glucose was 137. Rest of CMP WNL  Hemoglobin and hematocrit were low but improving. Rest of CBC WNL.   Next Visit: 10/14/2024  Last Visit: 05/11/2024  DX:  Rheumatoid arthritis of multiple sites with negative rheumatoid factor    Current Dose per office note 05/11/2024: Arava  20 mg 1 tablet by mouth daily.   Notified patient she is due to update labs.   Okay to refill Arava  ?

## 2024-06-25 ENCOUNTER — Ambulatory Visit: Payer: Self-pay | Admitting: Physician Assistant

## 2024-06-25 ENCOUNTER — Other Ambulatory Visit: Payer: Self-pay

## 2024-06-26 ENCOUNTER — Other Ambulatory Visit: Payer: Self-pay

## 2024-06-28 NOTE — Progress Notes (Incomplete)
 [name], D.O.  ABFM, ABOM Specializing in Clinical Bariatric Medicine  Office located at: [address] [place], [place]  [address]   Assessment and Plan:  No orders of the defined types were placed in this encounter.  There are no discontinued medications.   No orders of the defined types were placed in this encounter.    FOR THE DISEASE OF OBESITY:  Obesity (BMI 30-39.9), Starting BMI 11/26/23 32.02 BMI 31.0-31.9,adult - 31.32 Assessment & Plan: Since last office visit on 04/15/24 patient's muscle mass has decreased by 0.2 lb. Fat mass has increased by 2.2 lb. Total body water has increased by 3.8 lb.  Counseling done on how various foods will affect these numbers and how to maximize success  Total lbs lost to date: 4 lbs Total weight loss percentage to date: -2.25%    Recommended Dietary Goals Yazlyn is currently in the action stage of change. As such, her goal is to continue weight management plan.  She has agreed to: continue current plan   Behavioral Intervention We discussed the following today: increasing lean protein intake to established goals, decreasing simple carbohydrates , avoiding skipping meals, increasing water intake , work on tracking and journaling calories using tracking application, decreasing eating out or consumption of processed foods, and making healthy choices when eating convenient foods, and work on managing stress, creating time for self-care and relaxation  Additional resources provided today: Hand out on NEAT strategies  Evidence-based interventions for health behavior change were utilized today including the discussion of self monitoring techniques, problem-solving barriers and SMART goal setting techniques.   Regarding patient's less desirable eating habits and patterns, we employed the technique of small changes.   Pt will specifically work on: Increase NEAT  for next visit.    Recommended Physical Activity Goals Briellah  has been advised to work up to 300-450 minutes of moderate intensity aerobic activity a week and strengthening exercises 2-3 times per week for cardiovascular health, weight loss maintenance and preservation of muscle mass.   She has agreed to: Increase physical activity in their day and reduce sedentary time (increase NEAT). and Increase the intensity, frequency or duration of aerobic exercises     Pharmacotherapy We both agreed to: {EMagreedrx:29170}   ASSOCIATED CONDITIONS ADDRESSED TODAY:  Type 1 diabetes mellitus with complications [facility]) Assessment & Plan: Lab Results  Component Value Date   HGBA1C 6.1 (A) 06/09/2024   HGBA1C 6.0 (A) 01/27/2024   HGBA1C 6.3 (A) 09/26/2023   INSULIN  <0.4 (L) 11/26/2023    Last A1c was 6.1 on 06/09/2024, increased from prior A1c of 6.0 on 01/27/24. Pt is on an insulin  pump.  She is prescribed Glucagon , but keeps it for emergencies given she has not needed it for several years. Patient reports her blood glucose levels appeared to be more elevated while working at her previously job, which she attributes to the high-stress environment. She recently followed up with her endocrinologist and at the time her medications were adjusted and decreased; however, this occurred prior to starting her current job. Continue current medication regimen. I recommend she discuss future medication adjustments with her endocrinologist, given she has seen changes in her sugars since starting her new job. Encouraged pt to follow her meal plan and increase NEAT to improve her condition.     Hypertension associated with diabetes [facility]) Assessment & Plan: BP Readings from Last 3 Encounters:  06/24/24 135/71  06/12/24 139/78  06/09/24 136/70  BP is at goal. Patient is  taking emer 60 mg once daily, losartan  100 mg once daily, and Lopressor  25 mg BID.         ***   Irritable bowel syndrome with constipation Assessment & Plan: Pt has hx of IBS with constipation. Has been  taking Linzess  daily, although in the past few days she has needed to take additional medication to help with her constipation; pt was uncertain of exact medication name. Her last bowel movement was this morning. Encouraged pt to increase her water intake and increase fiber intake. Recommended pt take psyllium husks or Miralax as needed. Continue with Linzess  as prescribed.     Follow up:   Return in about 6 weeks (around 08/05/2024) for 4-6 week f/u.  She was informed of the importance of frequent follow up visits to maximize her success with intensive lifestyle modifications for her multiple health conditions.  Subjective:   Chief complaint: Obesity Teofila is here to discuss her progress with her obesity treatment plan. She is on the the Vegetarian Plan and states she is following her eating plan approximately 0% of the time. She states she is walking 30 minutes 3 days per week.   Interval History:  [name] is here for a follow up office visit. Since last OV on 04/15/2024,  she gained 2 lbs. She has been focusing on eating more lean protein but overall has struggled with staying on plan. She has not been meeting her daily calorie goal. 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 She notes her sugars seemed to be more elevated while working at her old job, which she attributes to the high-stress environment she was in. She was seen by her endocrinologist and discussed adjusting her medications, which were decreased, but notes this was prior to her starting her new job.   Pharmacotherapy for weight loss: She is currently taking no anti-obesity medication.   Review of Systems:  Pertinent positives were addressed with patient today.  Reviewed by clinician on day of visit: allergies, medications, problem list, medical history, surgical history, family history, social history, and previous encounter notes.  Weight Summary and Biometrics   Weight  Lost Since Last Visit: 0lb  Weight Gained Since Last Visit: 2lb   Vitals Temp: 98.5 F (36.9 C) BP: 135/71 Pulse Rate: 80 SpO2: 98 %   Anthropometric Measurements Height: 5' 2.5 (1.588 m) Weight: 174 lb (78.9 kg) BMI (Calculated): 31.3 Weight at Last Visit: 172lb Weight Lost Since Last Visit: 0lb Weight Gained Since Last Visit: 2lb Starting Weight: 178lb Total Weight Loss (lbs): 3 lb (1.361 kg)   Body Composition  Body Fat %: 44.6 % Fat Mass (lbs): 77.6 lbs Muscle Mass (lbs): 91.6 lbs Total Body Water (lbs): 74.6 lbs Visceral Fat Rating : 11   Other Clinical Data Fasting: No Labs: No Today's Visit #: 6 Starting Date: 11/26/23    Objective:   PHYSICAL EXAM: Blood pressure 135/71, pulse 80, temperature 98.5 F (36.9 C), height 5' 2.5 (1.588 m), weight 174 lb (78.9 kg), SpO2 98%. Body mass index is 31.32 kg/m.  General: she is overweight, cooperative and in no acute distress. PSYCH: Has normal mood, affect and thought process.   HEENT: EOMI, sclerae are anicteric. Lungs: Normal breathing effort, no conversational dyspnea. Extremities: Moves * 4 Neurologic: A and O * 3, good insight  DIAGNOSTIC DATA REVIEWED: BMET    Component Value Date/Time   NA 135 03/26/2024 1448   NA 140  11/26/2023 1123   K 3.9 03/26/2024 1448   CL 99 03/26/2024 1448   CO2 27 03/26/2024 1448   GLUCOSE 137 (H) 03/26/2024 1448   BUN 15 03/26/2024 1448   BUN 10 11/26/2023 1123   CREATININE 0.77 03/26/2024 1448   CALCIUM  8.9 03/26/2024 1448   GFRNONAA >60 06/01/2021 0757   GFRNONAA 74 04/07/2021 0855   GFRAA 86 04/07/2021 0855   Lab Results  Component Value Date   HGBA1C 6.1 (A) 06/09/2024   HGBA1C 6.8 06/26/2007   Lab Results  Component Value Date   INSULIN  <0.4 (L) 11/26/2023   Lab Results  Component Value Date   TSH 2.050 11/26/2023   CBC    Component Value Date/Time   WBC 4.2 03/26/2024 1448   RBC 3.89 03/26/2024 1448   HGB 11.6 (L) 03/26/2024 1448   HGB  11.1 (L) 09/18/2023 1447   HGB 13.4 02/15/2020 1656   HCT 33.9 (L) 03/26/2024 1448   HCT 38.9 02/15/2020 1656   PLT 217 03/26/2024 1448   PLT 188 09/18/2023 1447   PLT 172 02/15/2020 1656   MCV 87.1 03/26/2024 1448   MCV 87 02/15/2020 1656   MCH 29.8 03/26/2024 1448   MCHC 34.2 03/26/2024 1448   RDW 12.4 03/26/2024 1448   RDW 12.8 02/15/2020 1656   Iron  Studies    Component Value Date/Time   IRON  83 09/18/2023 1447   TIBC 406 09/18/2023 1447   FERRITIN 58 09/18/2023 1447   IRONPCTSAT 20 09/18/2023 1447   Lipid Panel     Component Value Date/Time   CHOL 162 11/26/2023 1123   TRIG 96 11/26/2023 1123   HDL 76 11/26/2023 1123   CHOLHDL 2 12/05/2022 1558   VLDL 19.0 12/05/2022 1558   LDLCALC 69 11/26/2023 1123   Hepatic Function Panel     Component Value Date/Time   PROT 6.2 03/26/2024 1448   PROT 6.1 11/26/2023 1123   ALBUMIN 4.4 11/26/2023 1123   AST 21 03/26/2024 1448   ALT 17 03/26/2024 1448   ALKPHOS 76 11/26/2023 1123   BILITOT 0.4 03/26/2024 1448   BILITOT 0.3 11/26/2023 1123   BILIDIR 0.1 11/08/2010 1257   IBILI 0.4 07/05/2009 0743      Component Value Date/Time   TSH 2.050 11/26/2023 1123   Nutritional Lab Results  Component Value Date   VD25OH 92.9 11/26/2023   VD25OH 74.19 12/05/2022   VD25OH 80.30 05/15/2022    Attestations:   I, Vernell Forest, acting as a Stage manager for Lisa [name], DO., have compiled all relevant documentation for today's office visit on behalf of Lisa [name], DO, while in the presence of [employer], DO.  I have reviewed the above documentation for accuracy and completeness, and I agree with the above. Lisa JINNY [name], D.O.  The 21st Century Cures Act was signed into law in 2016 which includes the topic of electronic health records.  This provides immediate access to information in MyChart.  This includes consultation notes, operative notes, office notes, lab results and pathology reports.  If you have any  questions about what you read please let us  know at your next visit so we can discuss your concerns and take corrective action if need be.  We are right here with you.

## 2024-06-29 ENCOUNTER — Other Ambulatory Visit: Payer: Self-pay

## 2024-06-29 ENCOUNTER — Encounter: Payer: Self-pay | Admitting: Cardiovascular Disease

## 2024-06-29 NOTE — Progress Notes (Signed)
 Specialty Pharmacy Refill Coordination Note  [name] is a 58 y.o. female contacted today regarding refills of specialty medication(s) Upadacitinib  (Rinvoq )   Patient requested Delivery   Delivery date: 07/01/24   Verified address: [address] [phone]   Medication will be filled on 07.01.25.

## 2024-06-30 ENCOUNTER — Other Ambulatory Visit: Payer: Self-pay

## 2024-07-01 ENCOUNTER — Other Ambulatory Visit: Payer: Self-pay

## 2024-07-01 DIAGNOSIS — Z79899 Other long term (current) drug therapy: Secondary | ICD-10-CM | POA: Diagnosis not present

## 2024-07-01 LAB — COMPREHENSIVE METABOLIC PANEL WITH GFR
AG Ratio: 2.4 (calc) (ref 1.0–2.5)
ALT: 22 U/L (ref 6–29)
AST: 30 U/L (ref 10–35)
Albumin: 4.8 g/dL (ref 3.6–5.1)
Alkaline phosphatase (APISO): 74 U/L (ref 37–153)
BUN: 20 mg/dL (ref 7–25)
CO2: 24 mmol/L (ref 20–32)
Calcium: 9.4 mg/dL (ref 8.6–10.4)
Chloride: 103 mmol/L (ref 98–110)
Creat: 0.77 mg/dL (ref 0.50–1.03)
Globulin: 2 g/dL (ref 1.9–3.7)
Glucose, Bld: 125 mg/dL — ABNORMAL HIGH (ref 65–99)
Potassium: 4.4 mmol/L (ref 3.5–5.3)
Sodium: 139 mmol/L (ref 135–146)
Total Bilirubin: 0.6 mg/dL (ref 0.2–1.2)
Total Protein: 6.8 g/dL (ref 6.1–8.1)
eGFR: 90 mL/min/{1.73_m2} (ref >=60)

## 2024-07-01 MED FILL — Clopidogrel Bisulfate Tab 75 MG (Base Equiv): ORAL | 90 days supply | Qty: 90 | Fill #1 | Status: CN

## 2024-07-01 MED FILL — Clopidogrel Bisulfate Tab 75 MG (Base Equiv): ORAL | 90 days supply | Qty: 90 | Fill #0 | Status: AC

## 2024-07-02 ENCOUNTER — Other Ambulatory Visit: Payer: Self-pay

## 2024-07-02 ENCOUNTER — Ambulatory Visit: Payer: Self-pay | Admitting: Rheumatology

## 2024-07-02 NOTE — Progress Notes (Signed)
 CMP is normal, glucose is mildly elevated at 125 probably not a fasting sample.

## 2024-07-07 ENCOUNTER — Ambulatory Visit (INDEPENDENT_AMBULATORY_CARE_PROVIDER_SITE_OTHER)

## 2024-07-07 ENCOUNTER — Ambulatory Visit: Admitting: Podiatry

## 2024-07-07 DIAGNOSIS — M2041 Other hammer toe(s) (acquired), right foot: Secondary | ICD-10-CM | POA: Diagnosis not present

## 2024-07-07 DIAGNOSIS — M2042 Other hammer toe(s) (acquired), left foot: Secondary | ICD-10-CM

## 2024-07-07 DIAGNOSIS — M84375A Stress fracture, left foot, initial encounter for fracture: Secondary | ICD-10-CM

## 2024-07-07 DIAGNOSIS — M069 Rheumatoid arthritis, unspecified: Secondary | ICD-10-CM

## 2024-07-07 DIAGNOSIS — M84374A Stress fracture, right foot, initial encounter for fracture: Secondary | ICD-10-CM | POA: Diagnosis not present

## 2024-07-07 DIAGNOSIS — M7742 Metatarsalgia, left foot: Secondary | ICD-10-CM

## 2024-07-07 DIAGNOSIS — M7741 Metatarsalgia, right foot: Secondary | ICD-10-CM | POA: Diagnosis not present

## 2024-07-07 NOTE — Progress Notes (Signed)
 Subjective:  Patient ID: [name], female    DOB: 09-Nov-1966,  MRN: [id number]  Chief Complaint  Patient presents with   Foot Pain    Pain and swelling in both feet worse on the right, no known injury    Discussed the use of AI scribe software for clinical note transcription with the patient, who gave verbal consent to proceed.  History of Present Illness [name] is a 58 year old female with type 1 diabetes and rheumatoid arthritis who presents with bilateral foot pain and swelling.  She experiences bilateral foot pain and swelling, with the right foot being more severely affected. The pain is significant in the middle three toes and heel of the left foot, and there is swelling in the right foot. The symptoms have persisted for approximately six weeks and are worsening, causing sleep disturbances despite taking Tylenol  3 at night. She has not used a walking boot or taken any specific medication for the foot pain, aside from Tylenol  3 for her back and neck issues.  No recent injuries to her feet have occurred, and she has not experienced burning or tingling sensations, indicating no neuropathy symptoms. A previous monofilament test did not show any sensory neuropathy. She has a history of a soft tissue tumor removal from the top of her foot, which was surgically excised due to its size.  Her past medical history includes type 1 diabetes since age 6, with a current A1c of 6.1, and rheumatoid arthritis, for which she is on Rinvoq . She mentions generalized joint pain and swelling, particularly in her ankle recently, and has received steroid treatment without significant improvement. No kidney function issues or history of sciatica are reported.  Recent radiographs showed arterial calcifications in the dorsal and plantar vasculature, with no evidence of acute stress fractures or bony callus.  She has an allergy to shellfish and iodinated contrast dye from CT scans, but has tolerated  premedication in the past.      Objective:    Physical Exam VASCULAR: DP and PT pulse palpable. Foot is warm and well-perfused. Capillary fill time is brisk. DERMATOLOGIC: Normal skin turgor, texture, and temperature. No open lesions, rashes, or ulcerations. NEUROLOGIC: Normal sensation to light touch and pressure. No paresthesias on examination. Numbness and radiating pain with palpation of dorsal cutaneous nerves on dorsal midfoot, less so at the ankle and none at the perineal nerve. ORTHOPEDIC: Pain with range of motion of the MTP joints no ecchymosis or bruising. No gross deformity. Pain to palpation of distal forefoot around metatarsal necks and MTP joints with edema, worse on the right side than on the left.       Results LABS A1c: 6.1%  RADIOLOGY Foot X-ray: Arterial calcifications of the dorsal and plantar vasculature. No evidence of acute stress fracture or fracture. No bony callus noted. Pes cavus foot type with metatarsus adductus, some dorsal spurring in the midfoot. (07/07/2024)   Assessment:   1. Metatarsalgia of both feet   2. Rheumatoid arthritis involving both feet, unspecified whether rheumatoid factor present (HCC)   3. Stress fracture of metatarsal bone of left foot, initial encounter   4. Stress fracture of metatarsal bone of right foot, initial encounter      Plan:  Patient was evaluated and treated and all questions answered.  Assessment and Plan Assessment & Plan Foot Pain and Swelling Chronic foot pain and swelling, more severe on the right side, persisting for approximately 1.5 months. Pain localized to the distal forefoot  around the metatarsal necks and MTP joints, with edema and tenderness. Differential diagnosis includes stress fracture, RA-related joint inflammation, neuropathic pain, and less likely Charcot arthropathy or gout. X-rays show no acute stress fracture. - Order bilateral foot MRI with and without contrast in 2-3 weeks to evaluate for  stress reaction, joint inflammation, or other causes.  X-rays were unrevealing for any of these etiologies - Provide walking boot for immobilization and weight-bearing as tolerated. - Schedule follow-up in 5 weeks. - Instruct to monitor for new symptoms such as severe redness, swelling, or inability to weight bear and report if she occurs. - Advise on shoe lacing technique to reduce nerve compression.  Arterial Calcification in Feet Arterial calcifications observed in the dorsal and plantar vasculature of the feet, common in long-term diabetes. Currently not affecting circulation as pulses are strong.  Type 1 Diabetes Mellitus Long-standing Type 1 Diabetes Mellitus with good glycemic control. A1c is 6.1. No current issues with sensory neuropathy or kidney function. Arterial calcifications noted in the feet, common in long-term diabetes.  Rheumatoid Arthritis Rheumatoid Arthritis with systemic joint pain. Currently managed with Rinvoq . Possible contribution to foot joint inflammation.      No follow-ups on file.

## 2024-07-07 NOTE — Patient Instructions (Addendum)
 Call [facility] to schedule your MRI at the below locations.  Please allow at least 1 business day after your visit to process the referral.  It may take longer depending on approval from insurance.  Please let me know if you have issues or problems scheduling the MRI  Schedule MRI for 2 to 3 weeks from now and wear the boot for at least 7 to 10 days while walking.  If this is improving her symptoms we can wait longer on the MRI if you would like  [facility] [phone] [address] [place] [address] [place], [place] [address]  [facility] [phone] [place], [place] [address]    VISIT SUMMARY: Today, you were seen for bilateral foot pain and swelling, which has been worsening over the past six weeks. We discussed your history of type 1 diabetes and rheumatoid arthritis, and reviewed recent radiographs showing arterial calcifications in your feet. We also talked about your current medications and any recent treatments.  YOUR PLAN: -FOOT PAIN AND SWELLING: You have been experiencing chronic foot pain and swelling, especially in your right foot, for about 1.5 months. This could be due to several reasons including stress fractures, joint inflammation from rheumatoid arthritis, or nerve pain. We will do an MRI of both feet in 2-3 weeks to get a clearer picture. In the meantime, you will use a walking boot to help with immobilization and weight-bearing as tolerated. Please monitor for any new symptoms like severe redness, swelling, or difficulty bearing weight and report them immediately. Additionally, we discussed a shoe lacing technique to reduce nerve compression.  -ARTERIAL CALCIFICATION IN FEET: Arterial calcifications were found in the blood vessels of your feet, which is common in long-term diabetes. Currently, this is not affecting your circulation as your pulses are strong.  -TYPE 1 DIABETES MELLITUS: You have long-standing Type 1  Diabetes Mellitus with good blood sugar control, as indicated by your A1c of 6.1. There are no current issues with nerve damage or kidney function, but arterial calcifications were noted in your feet, which is common in long-term diabetes.  -RHEUMATOID ARTHRITIS: You have Rheumatoid Arthritis, which causes systemic joint pain and is currently managed with Rinvoq . This condition may also be contributing to the inflammation in your foot joints.  INSTRUCTIONS: Please schedule an MRI for both feet in 2-3 weeks. Use the walking boot as directed and monitor for any new symptoms such as severe redness, swelling, or difficulty bearing weight. Follow up in 5 weeks for a re-evaluation.                      Contains text generated by [employer]bridge.                                 Contains text generated by Abridge.

## 2024-07-10 DIAGNOSIS — E10319 Type 1 diabetes mellitus with unspecified diabetic retinopathy without macular edema: Secondary | ICD-10-CM | POA: Diagnosis not present

## 2024-07-13 MED FILL — Losartan Potassium Tab 100 MG: ORAL | 90 days supply | Qty: 90 | Fill #1 | Status: AC

## 2024-07-14 ENCOUNTER — Other Ambulatory Visit: Payer: Self-pay

## 2024-07-15 ENCOUNTER — Other Ambulatory Visit: Payer: Self-pay

## 2024-07-16 ENCOUNTER — Other Ambulatory Visit: Payer: Self-pay | Admitting: Rheumatology

## 2024-07-16 ENCOUNTER — Other Ambulatory Visit: Payer: Self-pay

## 2024-07-16 ENCOUNTER — Other Ambulatory Visit

## 2024-07-17 ENCOUNTER — Other Ambulatory Visit: Payer: Self-pay

## 2024-07-17 MED ORDER — CYCLOBENZAPRINE HCL 10 MG PO TABS
10.0000 mg | ORAL_TABLET | Freq: Every evening | ORAL | 2 refills | Status: AC | PRN
Start: 2024-07-17 — End: ?
  Filled 2024-07-17: qty 30, 30d supply, fill #0
  Filled 2024-08-16: qty 30, 30d supply, fill #1
  Filled 2024-09-06 – 2024-09-09 (×2): qty 30, 30d supply, fill #2

## 2024-07-17 NOTE — Telephone Encounter (Signed)
 Last Fill: 04/15/2024  Next Visit: 10/15  Last Visit: 05/11/2024  Dx: Spondylosis of lumbar spine   Current Dose per office note on 05/11/2024: flexeril  10 mg 1 tablet by mouth at bedtime   Okay to refill Flexeril ?

## 2024-07-28 ENCOUNTER — Ambulatory Visit: Admitting: Sleep Medicine

## 2024-07-28 ENCOUNTER — Other Ambulatory Visit: Payer: Self-pay

## 2024-07-28 ENCOUNTER — Other Ambulatory Visit (HOSPITAL_COMMUNITY): Payer: Self-pay

## 2024-07-28 ENCOUNTER — Encounter: Payer: Self-pay | Admitting: Sleep Medicine

## 2024-07-28 ENCOUNTER — Encounter (INDEPENDENT_AMBULATORY_CARE_PROVIDER_SITE_OTHER): Payer: Self-pay

## 2024-07-28 VITALS — BP 108/60 | HR 76 | Temp 98.3°F | Ht 62.0 in | Wt 183.4 lb

## 2024-07-28 DIAGNOSIS — Z6832 Body mass index (BMI) 32.0-32.9, adult: Secondary | ICD-10-CM

## 2024-07-28 DIAGNOSIS — G4733 Obstructive sleep apnea (adult) (pediatric): Secondary | ICD-10-CM | POA: Diagnosis not present

## 2024-07-28 DIAGNOSIS — E66811 Obesity, class 1: Secondary | ICD-10-CM

## 2024-07-28 NOTE — Patient Instructions (Addendum)

## 2024-07-28 NOTE — Progress Notes (Unsigned)
 Name:[name] MRN: [id number] DOB: 21-Mar-1966   CHIEF COMPLAINT:  CPAP F/U   HISTORY OF PRESENT ILLNESS:  [name] is a 58 y.o. w/ a h/o OSA, DMII, hyperlipidemia, HTN and obesity who presents for CPAP F/U visit. Reports using CPAP therapy almost every night, which is confirmed by compliance data. She is currently using the Airfit N30i nasal mask, which causes air leaks. Reports disruptive sleep due to nocturia.   c/o loud snoring and excessive daytime sleepiness which has been present for several years. Reports nocturnal awakenings due to unclear reasons, however does not have difficulty falling back to sleep. Denies any significant weight changes. Denies morning headaches, RLS symptoms, dream enactment, cataplexy, hypnagogic or hypnapompic hallucinations. Reports a family history of sleep apnea. Denies drowsy driving. Drinks 1-2 sodas daily, occasional alcohol use, former smoker, denies illicit drug use.   Bedtime 11 pm Sleep onset 10 mins Rise time 6:30-7:30 am   EPWORTH SLEEP SCORE    04/23/2024    3:00 PM  Results of the Epworth flowsheet  Sitting and reading 3  Watching TV 2  Sitting, inactive in a public place (e.g. a theatre or a meeting) 2  As a passenger in a car for an hour without a break 1  Lying down to rest in the afternoon when circumstances permit 2  Sitting and talking to someone 0  Sitting quietly after a lunch without alcohol 2  In a car, while stopped for a few minutes in traffic 0  Total score 12    PAST MEDICAL HISTORY :   has a past medical history of ACUT MI ANTEROLAT WALL SUBSQT EPIS CARE, Acute maxillary sinusitis, ALLERGIC RHINITIS, SEASONAL, Allergy, Anemia, Arthritis, ARTHRITIS, RHEUMATOID, Atrial fibrillation (HCC), Back pain, Blood transfusion without reported diagnosis, Bruxism (teeth grinding), CAD, ARTERY BYPASS GRAFT, CARPAL TUNNEL SYNDROME, BILATERAL, Cataract, CHOLELITHIASIS, Clotting disorder (HCC), Contrast media allergy,  DERMATITIS, ALLERGIC, DIABETES MELLITUS, TYPE I, DIABETIC  RETINOPATHY, GERD (gastroesophageal reflux disease), Graves' disease, Heart attack (HCC), Hiatal hernia, HYPERLIPIDEMIA-MIXED, Hypertension, HYPERTHYROIDISM, IBS (irritable bowel syndrome), IDA (iron  deficiency anemia), Infertility, female, Insulin  pump in place, Lymphadenopathy of head and neck, MIGRAINE W/O AURA W/INTRACT W/STATUS MIGRAINOSUS (02/19/2008), PONV (postoperative nausea and vomiting), Psoriasis, Rheumatoid arthritis (HCC), SINUS TACHYCARDIA (11/08/2010), Sleep apnea, Stomach ulcer, SVT (supraventricular tachycardia) (HCC), Tendonitis, TRIGGER FINGER, and URI.  has a past surgical history that includes Coronary artery bypass graft; Vitrectomy; Caesarean section; Carpal tunnel release; Cholecystectomy; Cardiac catheterization (N/A, 05/08/2016); Abdominal hysterectomy; Eye surgery; Trigger finger release; Cataract extraction, bilateral; LEFT HEART CATH AND CORS/GRAFTS ANGIOGRAPHY (N/A, 02/22/2020); Colonoscopy with propofol  (N/A, 03/25/2020); Esophagogastroduodenoscopy (egd) with propofol  (N/A, 03/25/2020); Carpal tunnel release (Left, 06/06/2021); Ulnar nerve transposition (Left, 06/06/2021); and Cesarean section. Prior to Admission medications   Medication Sig Start Date End Date Taking? Authorizing Provider  acetaminophen -codeine  (TYLENOL  #3) 300-30 MG tablet Take 1 tablet by mouth 2 (two) times daily as needed for moderate pain (pain score 4-6). 05/01/24  Yes Lovorn, Megan, MD  albuterol  (VENTOLIN  HFA) 108 (90 Base) MCG/ACT inhaler Inhale 2 puffs into the lungs every 4 (four) hours as needed.   Yes [provider]  Albuterol -Budesonide  (AIRSUPRA ) 90-80 MCG/ACT AERO Inhale 2 puffs into the lungs every 6 (six) hours. 02/18/24  Yes [name] Dedra CROME, MD  Ascorbic Acid 500 MG CAPS Take by mouth. 10/21/13  Yes [provider]  aspirin  EC 81 MG tablet    Yes [provider]  Blood Glucose Monitoring Suppl  (FREESTYLE FREEDOM LITE)  w/Device KIT Use as directed by physician to check blood sugar 02/27/22  Yes   CHELATED MAGNESIUM PO Take 250 mg by mouth daily.   Yes [provider]  Cholecalciferol (VITAMIN D3) 125 MCG (5000 UT) CAPS Decrease to 1 tab every other day 12/11/23  Yes Opalski, Deborah, DO  clopidogrel  (PLAVIX ) 75 MG tablet TAKE 1 TABLET BY MOUTH ONCE DAILY 01/16/24  Yes Wonda Sharper, MD  Cyanocobalamin  (VITAMIN B-12 PO) Take by mouth.   Yes [provider]  cyclobenzaprine  (FLEXERIL ) 10 MG tablet Take 1 tablet (10 mg total) by mouth at bedtime as needed for muscle spasms. 07/17/24  Yes Deveshwar, Maya, MD  EPINEPHrine  0.3 mg/0.3 mL IJ SOAJ injection Inject 0.3 mg into the muscle as needed for anaphylaxis. 11/14/21  Yes McLean-Scocuzza, Randine SAILOR, MD  fluticasone  (FLONASE ) 50 MCG/ACT nasal spray Place 2 sprays into both nostrils daily. 11/27/23  Yes Wendee Lynwood HERO, NP  fluticasone -salmeterol (ADVAIR ) 100-50 MCG/ACT AEPB Inhale 1 puff into the lungs 2 (two) times daily. 02/13/24  Yes Wendee Lynwood HERO, NP  Glucagon  (GVOKE HYPOPEN  2-PACK) 0.5 MG/0.1ML SOAJ Inject 0.5 mg into the skin as needed (Hypoglycemic episode). 06/24/24  Yes Trixie File, MD  glucose blood (FREESTYLE LITE) test strip Use to check blood sugar 3 time(s) daily 09/26/23  Yes Trixie File, MD  glucose blood test strip    Yes [provider]  guaiFENesin -codeine  100-10 MG/5ML syrup Take 5 mLs by mouth at bedtime. 02/06/24  Yes Kaur, Charanpreet, NP  insulin  detemir (LEVEMIR ) 100 UNIT/ML injection Inject 36 units into the skin once daily in the event of insulin  pump failure. 07/09/22  Yes   insulin  lispro (HUMALOG ) 100 UNIT/ML injection Inject 0.9-1.2 mLs (90-120 Units total) into the skin daily. 05/11/24  Yes Trixie File, MD  Insulin  Syringe-Needle U-100 (BD INSULIN  SYRINGE U/F) 31G X 5/16 0.3 ML MISC use with insulin  injections 3 times daily in the event of insulin  pump failure. 11/22/21  Yes    isosorbide  mononitrate (IMDUR ) 60 MG 24 hr tablet Take 1 tablet (60 mg total) by mouth daily. 03/26/24  Yes Wonda Sharper, MD  Krill Oil 500 MG CAPS Take 500 mg by mouth daily.   Yes [provider]  Lancets (FREESTYLE) lancets Use to check blood sugar 3 time(s) daily. 02/27/22  Yes   leflunomide  (ARAVA ) 20 MG tablet Take 1 tablet (20 mg total) by mouth daily. 06/24/24  Yes Cheryl Waddell HERO, PA-C  linaclotide  (LINZESS ) 290 MCG CAPS capsule Take 1 capsule (290 mcg total) by mouth daily before breakfast. 08/19/23  Yes Therisa Bi, MD  losartan  (COZAAR ) 100 MG tablet Take 1 tablet (100 mg total) by mouth daily. 04/14/24  Yes Wonda Sharper, MD  metoprolol  tartrate (LOPRESSOR ) 50 MG tablet Take 0.5 tablets (25 mg total) by mouth 2 (two) times daily. 03/02/24  Yes Wonda Sharper, MD  montelukast  (SINGULAIR ) 10 MG tablet Take 1 tablet (10 mg total) by mouth at bedtime. 02/26/24  Yes Wendee Lynwood HERO, NP  Multiple Vitamin (MULTIVITAMIN) capsule Take 1 capsule by mouth daily.   Yes [provider]  ondansetron  (ZOFRAN ) 4 MG tablet Take 1 tablet (4 mg total) by mouth every 8 (eight) hours as needed for nausea or vomiting. Patient taking differently: Take 4 mg by mouth as needed for nausea or vomiting. 02/10/24  Yes Lovorn, Megan, MD  pantoprazole  (PROTONIX ) 40 MG tablet Take 1 tablet (40 mg total) by mouth 2 (two) times daily. 01/08/24 01/07/25 Yes Therisa Bi, MD  potassium chloride   SA (KLOR-CON  M20) 20 MEQ tablet Take 1 tablet (20 mEq total) by mouth daily. 08/21/23  Yes Wonda Sharper, MD  rosuvastatin  (CRESTOR ) 20 MG tablet Take 1 tablet (20 mg total) by mouth daily. 03/13/24  Yes Wonda Sharper, MD  torsemide  (DEMADEX ) 20 MG tablet Take 1 tablet (20 mg total) by mouth daily. 03/02/24  Yes Wonda Sharper, MD  Turmeric (QC TUMERIC COMPLEX PO) Take 1,000 mg by mouth.   Yes [provider]  Upadacitinib  ER (RINVOQ ) 15 MG TB24 Take 1 tablet (15 mg) by mouth daily. 05/29/24  Yes Deveshwar,  Maya, MD  ipratropium (ATROVENT ) 0.06 % nasal spray Place 2 sprays into both nostrils 4 (four) times daily. Patient not taking: Reported on 07/28/2024 08/24/23   Bernardino Ditch, NP  neomycin -polymyxin b-dexamethasone  (MAXITROL ) 3.5-10000-0.1 SUSP Place 1 drop into both eyes 4 (four) times daily. Patient not taking: Reported on 07/28/2024 04/07/24      Allergies  Allergen Reactions   Actemra  [Tocilizumab ]    Ramipril Swelling, Rash and Other (See Comments)   Shellfish-Derived Products Swelling    Shrimp    Atorvastatin Rash    Elevated LFT's   Compazine  [Prochlorperazine Edisylate] Other (See Comments)    Neurological reaction   Emgality  [Galcanezumab -Gnlm] Hives and Swelling   Etanercept Swelling and Rash   Infliximab Rash   Iohexol      Iv contrast dye -rash all over   Orencia [Abatacept] Rash   Prochlorperazine Edisylate     unknown   Tofacitinib Rash and Other (See Comments)    Severe abdominal pain    Trokendi  Xr [Topiramate  Er]     Brain fog, memory issues, word finding issues   Shrimp Extract Other (See Comments)   Topiramate  Other (See Comments)   Tramadol      Nausea    Amiodarone Nausea Only   Rituximab Rash    Causes a rash    FAMILY HISTORY:  family history includes Anxiety disorder in her mother; Bipolar disorder in her mother; Colon polyps in her father; Depression in her father, mother, and another family member; Diabetes in her father; Eating disorder in her mother; Heart disease in an other family member; Hyperlipidemia in her father, mother, and another family member; Hypertension in her father, mother, and another family member; Migraines in an other family member; Obesity in her father; Parkinson's disease in her father; Sleep apnea in her father and mother; Stroke in her paternal grandmother; Thyroid  disease in her mother. SOCIAL HISTORY:  reports that she has never smoked. She has never been exposed to tobacco smoke. She has never used smokeless tobacco. She  reports current alcohol use. She reports that she does not use drugs.   Review of Systems:  Gen:  Denies  fever, sweats, chills weight loss  HEENT: Denies blurred vision, double vision, ear pain, eye pain, hearing loss, nose bleeds, sore throat Cardiac:  No dizziness, chest pain or heaviness, chest tightness,edema, No JVD Resp:   No cough, -sputum production, -shortness of breath,-wheezing, -hemoptysis,  Gi: Denies swallowing difficulty, stomach pain, nausea or vomiting, diarrhea, constipation, bowel incontinence Gu:  Denies bladder incontinence, burning urine Ext:   Denies Joint pain, stiffness or swelling Skin: Denies  skin rash, easy bruising or bleeding or hives Endoc:  Denies polyuria, polydipsia , polyphagia or weight change Psych:   Denies depression, insomnia or hallucinations  Other:  All other systems negative  VITAL SIGNS: BP 108/60 (BP Location: Right Arm, Patient Position: Sitting, Cuff Size: Large)   Pulse 76  Temp 98.3 F (36.8 C) (Oral)   Ht 5' 2 (1.575 m)   Wt 183 lb 6.4 oz (83.2 kg)   SpO2 97%   BMI 33.54 kg/m    Physical Examination:   General Appearance: No distress  EYES PERRLA, EOM intact.   NECK Supple, No JVD Pulmonary: normal breath sounds, No wheezing.  CardiovascularNormal S1,S2.  No m/r/g.   Abdomen: Benign, Soft, non-tender. Skin:   warm, no rashes, no ecchymosis  Extremities: normal, no cyanosis, clubbing. Neuro:without focal findings,  speech normal  PSYCHIATRIC: Mood, affect within normal limits.   ASSESSMENT AND PLAN  OSA I suspect that OSA is likely present due to clinical presentation. Discussed the consequences of untreated sleep apnea. Advised not to drive drowsy for safety of patient and others. Will complete further evaluation with a home sleep study and follow up to review results.    HTN Stable, on current management. Following with PCP.    MEDICATION ADJUSTMENTS/LABS AND TESTS ORDERED: Recommend Sleep Study   Patient   satisfied with Plan of action and management. All questions answered  Follow up to review HST results and treatment plan.   I spent a total of  *** minutes reviewing chart data, face-to-face evaluation with the patient, counseling and coordination of care as detailed above.    Estephanie Hubbs, M.D.  Sleep Medicine [facility]

## 2024-07-28 NOTE — Progress Notes (Signed)
 Specialty Pharmacy Refill Coordination Note  [name] is a 58 y.o. female contacted today regarding refills of specialty medication(s) Upadacitinib  (Rinvoq )   Patient requested (Patient-Rptd) Delivery   Delivery date: 07/31/24   Verified address: (Patient-Rptd) [address] [place] [address]   Medication will be filled on 07/30/24.

## 2024-07-29 ENCOUNTER — Other Ambulatory Visit: Payer: Self-pay

## 2024-07-30 ENCOUNTER — Ambulatory Visit: Admitting: Podiatry

## 2024-07-31 MED FILL — Insulin Lispro Inj Soln 100 Unit/ML: INTRAMUSCULAR | 83 days supply | Qty: 100 | Fill #1 | Status: AC

## 2024-08-04 ENCOUNTER — Other Ambulatory Visit: Payer: Self-pay

## 2024-08-05 ENCOUNTER — Ambulatory Visit (INDEPENDENT_AMBULATORY_CARE_PROVIDER_SITE_OTHER): Admitting: Family Medicine

## 2024-08-10 ENCOUNTER — Other Ambulatory Visit: Payer: Self-pay

## 2024-08-10 ENCOUNTER — Other Ambulatory Visit: Payer: Self-pay | Admitting: Gastroenterology

## 2024-08-11 ENCOUNTER — Other Ambulatory Visit: Payer: Self-pay

## 2024-08-13 ENCOUNTER — Ambulatory Visit
Admission: RE | Admit: 2024-08-13 | Discharge: 2024-08-13 | Disposition: A | Discharge status: Home / Self Care | Source: Ambulatory Visit | Attending: Podiatry | Admitting: Podiatry

## 2024-08-13 ENCOUNTER — Ambulatory Visit: Admitting: Podiatry

## 2024-08-13 DIAGNOSIS — M069 Rheumatoid arthritis, unspecified: Secondary | ICD-10-CM

## 2024-08-13 DIAGNOSIS — M19072 Primary osteoarthritis, left ankle and foot: Secondary | ICD-10-CM | POA: Diagnosis not present

## 2024-08-13 DIAGNOSIS — M84375A Stress fracture, left foot, initial encounter for fracture: Secondary | ICD-10-CM

## 2024-08-13 DIAGNOSIS — M84374A Stress fracture, right foot, initial encounter for fracture: Secondary | ICD-10-CM

## 2024-08-13 MED ORDER — GADOPICLENOL 0.5 MMOL/ML IV SOLN
8.0000 mL | Freq: Once | INTRAVENOUS | Status: AC | PRN
  Administered 2024-08-13: 8 mL via INTRAVENOUS

## 2024-08-14 ENCOUNTER — Other Ambulatory Visit: Payer: Self-pay

## 2024-08-14 ENCOUNTER — Encounter: Payer: Self-pay | Admitting: Nurse Practitioner

## 2024-08-14 ENCOUNTER — Telehealth: Payer: Self-pay

## 2024-08-14 MED ORDER — LINACLOTIDE 290 MCG PO CAPS
290.0000 ug | ORAL_CAPSULE | Freq: Every day | ORAL | 1 refills | Status: AC
  Filled 2024-08-14 – 2024-08-18 (×2): qty 90, 90d supply, fill #0
  Filled 2024-11-13: qty 90, 90d supply, fill #1

## 2024-08-14 NOTE — Telephone Encounter (Signed)
   Name: [name]  DOB: 08-Dec-1966  MRN: [id number]  Primary Cardiologist: [name], MD  Chart reviewed as part of pre-operative protocol coverage. The patient has an upcoming visit scheduled with  Lisa Ferrier, PA on 09/21/2024 at which time clearance can be addressed in case there are any issues that would impact surgical recommendations.  Bilateral upper eyelid blepharoptosis repair through skin removal approach Is not scheduled until 10/29/2025as below. I added preop FYI to appointment note so that provider is aware to address at time of outpatient visit.  Per office protocol the cardiology provider should forward their finalized clearance decision and recommendations regarding antiplatelet therapy to the requesting party below.     I will route this message as FYI to requesting party and remove this message from the preop box as separate preop APP input not needed at this time.   Please call with any questions.  Lamarr Satterfield, NP  08/14/2024, 11:10 AM

## 2024-08-14 NOTE — Telephone Encounter (Signed)
   Pre-operative Risk Assessment    Patient Name: [name]  DOB: 09-29-1966 MRN: [id number]   Date of last office visit: 08/21/23 Date of next office visit: Not scheduled   Request for Surgical Clearance    Procedure:  Bilateral upper eyelid blepharoptosis repair through skin removal approach  Date of Surgery:  Clearance 10/28/24                                Surgeon:  Dr. Refugio Hurl Surgeon's Group or Practice Name:  [facility] Phone number:  [phone] Fax number:  [phone]   Type of Clearance Requested:   - Medical    Type of Anesthesia:  MAC   Additional requests/questions:    [name]   08/14/2024, 8:06 AM

## 2024-08-16 ENCOUNTER — Other Ambulatory Visit: Payer: Self-pay | Admitting: Cardiovascular Disease

## 2024-08-17 ENCOUNTER — Other Ambulatory Visit: Payer: Self-pay

## 2024-08-17 ENCOUNTER — Encounter: Attending: Physical Medicine and Rehabilitation | Admitting: Physical Medicine and Rehabilitation

## 2024-08-17 ENCOUNTER — Encounter: Payer: Self-pay | Admitting: Physical Medicine and Rehabilitation

## 2024-08-17 VITALS — BP 123/73 | HR 79 | Ht 62.0 in | Wt 182.0 lb

## 2024-08-17 DIAGNOSIS — E108 Type 1 diabetes mellitus with unspecified complications: Secondary | ICD-10-CM | POA: Insufficient documentation

## 2024-08-17 DIAGNOSIS — G43011 Migraine without aura, intractable, with status migrainosus: Secondary | ICD-10-CM | POA: Diagnosis not present

## 2024-08-17 DIAGNOSIS — Z79891 Long term (current) use of opiate analgesic: Secondary | ICD-10-CM | POA: Diagnosis not present

## 2024-08-17 DIAGNOSIS — G894 Chronic pain syndrome: Secondary | ICD-10-CM | POA: Insufficient documentation

## 2024-08-17 DIAGNOSIS — G8929 Other chronic pain: Secondary | ICD-10-CM | POA: Diagnosis not present

## 2024-08-17 DIAGNOSIS — M7918 Myalgia, other site: Secondary | ICD-10-CM | POA: Diagnosis not present

## 2024-08-17 DIAGNOSIS — Z5181 Encounter for therapeutic drug level monitoring: Secondary | ICD-10-CM | POA: Insufficient documentation

## 2024-08-17 MED ORDER — ACETAMINOPHEN-CODEINE 300-30 MG PO TABS
1.0000 | ORAL_TABLET | Freq: Two times a day (BID) | ORAL | 3 refills | Status: AC | PRN
  Filled 2024-08-17: qty 60, 30d supply, fill #0

## 2024-08-17 NOTE — Addendum Note (Signed)
 Addended by: GEORGINA BARI CROME on: 08/17/2024 04:22 PM   Modules accepted: Orders

## 2024-08-17 NOTE — Progress Notes (Signed)
 Subjective:    Patient ID: [name], female    DOB: [birth year], 58 y.o.   MRN: [id number]  HPI  Pt is a 58 yr old female with hx of RA, (-) rh factor, thoracic DDD, DM1 with retinopathy- Ac 6.5, migraines without aura, and MI/CABG in past-  Also has Graves disease; and just dx'd with tendinitis in B/L hamstring tendinosis- Here for f/u of chronic pain and trigger point injections .                    Still taking Tylenol  #3-  taking 2 the other day, but usually takes less than 1x/day- 4-5x/week- but lately it's been worse due to weather,  And pushes thing to the limit.   Did get theracane- and used ice last night- couldn't get  comfortable.  Helped lower back and massager on upper back .   Works as IT consultant- less physical- not pushing body as much- but on computer more. Computer  sounds like it's too high.  Also has a standing desk that has access to- uses it sometimes.     Pain Inventory Average Pain 7 Pain Right Now 10 My pain is intermittent, constant, and aching  In the last 24 hours, has pain interfered with the following? General activity 0 Relation with others 0 Enjoyment of life 5 What TIME of day is your pain at its worst? morning , daytime, evening, and night Sleep (in general) Fair  Pain is worse with: bending and some activites Pain improves with: medication, injections, and ice Relief from Meds: 5     Family History  Problem Relation Age of Onset   Depression Mother    Anxiety disorder Mother    Hypertension Mother    Hyperlipidemia Mother    Thyroid  disease Mother    Bipolar disorder Mother    Sleep apnea Mother    Eating disorder Mother    Colon polyps Father    Diabetes Father        2   Hypertension Father    Parkinson's disease Father    Hyperlipidemia Father    Depression Father    Sleep apnea Father    Obesity Father    Stroke Paternal Grandmother    Heart disease Other    Hypertension Other    Hyperlipidemia Other     Depression Other    Migraines Other    Heart attack Neg Hx    Colon cancer Neg Hx    Stomach cancer Neg Hx    Esophageal cancer Neg Hx    Social History   Socioeconomic History   Marital status: Divorced    Spouse name: Not on file   Number of children: Not on file   Years of education: Not on file   Highest education level: Not on file  Occupational History   Not on file  Tobacco Use   Smoking status: Never    Passive exposure: Never   Smokeless tobacco: Never  Vaping Use   Vaping status: Never Used  Substance and Sexual Activity   Alcohol use: Yes    Comment: rarely 1 every 6 months   Drug use: No   Sexual activity: Not Currently    Partners: Male    Birth control/protection: None  Other Topics Concern   Not on file  Social History Narrative   Divorced. Has 2 kids(fraternal twins) daughters age 48. Works at  [employer], Never smoked, denies ETOH, no drugs. Drinks diet coke.  No exercise.       DPR daughters Eleanor and Pam Vanalstine twins          Social Drivers of Health   Financial Resource Strain: Not on file  Food Insecurity: No Food Insecurity (05/22/2022)   Received from [facility]   Hunger Vital Sign    Within the past 12 months, you worried that your food would run out before you got the money to buy more.: Never true    Within the past 12 months, the food you bought just didn't last and you didn't have money to get more.: Never true  Transportation Needs: Not on file  Physical Activity: Not on file  Stress: Not on file  Social Connections: Unknown (05/09/2022)   Received from [facility]   Social Network    Social Network: Not on file   Past Surgical History:  Procedure Laterality Date   ABDOMINAL HYSTERECTOMY     endometriomas b/l; total ? cervix removed; no h/o abnormal pap   Caesarean section     CARDIAC CATHETERIZATION N/A 05/08/2016   Procedure: Left Heart Cath and Cors/Grafts Angiography;  Surgeon: Lonni JONETTA Cash, MD;   Location: [facility] INVASIVE CV LAB;  Service: Cardiovascular;  Laterality: N/A;   CARPAL TUNNEL RELEASE     b/l    CARPAL TUNNEL RELEASE Left 06/06/2021   Procedure: CARPAL TUNNEL RELEASE;  Surgeon: Murrell Kuba, MD;  Location: [facility];  Service: Orthopedics;  Laterality: Left;  AXILLARY BLOCK   CATARACT EXTRACTION, BILATERAL     CESAREAN SECTION     CHOLECYSTECTOMY     COLONOSCOPY WITH PROPOFOL  N/A 03/25/2020   Procedure: COLONOSCOPY WITH PROPOFOL ;  Surgeon: Therisa Bi, MD;  Location: [facility] ENDOSCOPY;  Service: Gastroenterology;  Laterality: N/A;   CORONARY ARTERY BYPASS GRAFT     ESOPHAGOGASTRODUODENOSCOPY (EGD) WITH PROPOFOL  N/A 03/25/2020   Procedure: ESOPHAGOGASTRODUODENOSCOPY (EGD) WITH PROPOFOL ;  Surgeon: Therisa Bi, MD;  Location: [facility] ENDOSCOPY;  Service: Gastroenterology;  Laterality: N/A;   EYE SURGERY     laser x 2 for retinopathy    LEFT HEART CATH AND CORS/GRAFTS ANGIOGRAPHY N/A 02/22/2020   Procedure: LEFT HEART CATH AND CORS/GRAFTS ANGIOGRAPHY;  Surgeon: Cash Lonni JONETTA, MD;  Location: [facility] INVASIVE CV LAB;  Service: Cardiovascular;  Laterality: N/A;   TRIGGER FINGER RELEASE     b/l fingers all    ULNAR NERVE TRANSPOSITION Left 06/06/2021   Procedure: ULNAR NERVE DECOMPRESSION;  Surgeon: Murrell Kuba, MD;  Location:  SURGERY CENTER;  Service: Orthopedics;  Laterality: Left;  AXILLARY BLOCK   VITRECTOMY     b/l    Past Medical History:  Diagnosis Date   ACUT MI ANTEROLAT WALL SUBSQT EPIS CARE    Acute maxillary sinusitis    ALLERGIC RHINITIS, SEASONAL    Allergy    Anemia    Arthritis    ARTHRITIS, RHEUMATOID    shoulders and hands Enbrel>leg swelling Dr. Dolphus   Atrial fibrillation [facility])    a. after CABG.   Back pain    Blood transfusion without reported diagnosis    Bruxism (teeth grinding)    CAD, ARTERY BYPASS GRAFT    a. DES to RCA in 2010 then LAD occlusion s/p CABG 3 06/07/2009 with LIMA to LAD, reverse SVG to D1, reverse SVG  to distal RCA. b. Cath 05/08/2016 slightly hypodense region in the intermediate branch, however she had excellent flow, FFR was normal. Vein graft to PDA and the posterolateral branch is patent, patent LIMA to LAD,  occluded SVG to diagonal.   CARPAL TUNNEL SYNDROME, BILATERAL    Cataract    CHOLELITHIASIS    Clotting disorder (HCC)    Contrast media allergy    DERMATITIS, ALLERGIC    DIABETES MELLITUS, TYPE I    on insulin  pump dx'ed age 38 y.o    DIABETIC  RETINOPATHY    GERD (gastroesophageal reflux disease)    Graves' disease    Heart attack (HCC)    Hiatal hernia    HYPERLIPIDEMIA-MIXED    Hypertension    HYPERTHYROIDISM    Dr. Hosie Balder   IBS (irritable bowel syndrome)    IDA (iron  deficiency anemia)    Infertility, female    Insulin  pump in place    Lymphadenopathy of head and neck    MIGRAINE W/O AURA W/INTRACT W/STATUS MIGRAINOSUS 02/19/2008   PONV (postoperative nausea and vomiting)    Psoriasis    Rheumatoid arthritis (HCC)    SINUS TACHYCARDIA 11/08/2010   Sleep apnea    not using machine yet    Stomach ulcer    SVT (supraventricular tachycardia) (HCC)    after s/p CABG   Tendonitis    TRIGGER FINGER    all fingers b/l hands    URI    Ht 5' 2 (1.575 m)   Wt 182 lb (82.6 kg)   BMI 33.29 kg/m   Opioid Risk Score:   Fall Risk Score:  `1  Depression screen [facility] 2/9     06/12/2024    2:31 PM 05/01/2024    3:25 PM 02/06/2024    3:31 PM 12/12/2023    3:37 PM 11/01/2023   11:55 AM 08/30/2023    3:26 PM 06/28/2023    2:43 PM  Depression screen PHQ 2/9  Decreased Interest 0 0 1 0 0 0 0  Down, Depressed, Hopeless 0 0 0 0 0 0 0  PHQ - 2 Score 0 0 1 0 0 0 0  Altered sleeping   2 2 3 2    Tired, decreased energy   2 2 3 2    Change in appetite   0 0 0 0   Feeling bad or failure about yourself    0 0 0 0   Trouble concentrating   0 0 0 0   Moving slowly or fidgety/restless   0 0 0 0   Suicidal thoughts   0 0 0 0   PHQ-9 Score   5 4 6 4    Difficult doing  work/chores   Somewhat difficult Somewhat difficult Somewhat difficult Not difficult at all     Review of Systems  Musculoskeletal:  Positive for back pain.       Right pain (toe area)  All other systems reviewed and are negative.      Objective:   Physical Exam  Awake, alert, appropriate, NAD_ no assistive device       Assessment & Plan:   Pt is a 58 yr old female with hx of RA, (-) rh factor, thoracic DDD, DM1 with retinopathy- Ac 6.5, migraines without aura, and MI/CABG in past-  Also has Graves disease; and just dx'd with tendinitis in B/L hamstring tendinosis- Here for f/u of chronic pain and trigger point injections .                    Will recheck oral drug screen per clinic policy- since on Tylenol  #3  2. Needs to have elbows at 90 degrees give or take a couple of degrees -  needs to make keyboard lower-   3.  1-2x/week 7pm to 7am-  place patches- on each side of neck- 1 patch needs ot cover C7 bone and both need to cover scalenes  muscles on side of neck- and a third patch below them- covering B/L- upper back muscles.  4. When you take the patch off in AM- muscles are looser and can get deeper into muscle.   5.  GO back to seeing me every 3 months- since on Tylenol  #3 and using just occasionally, can see q3 months not q2 months.    I spent a total of  21  minutes on total care today- >50% coordination of care- due to d/w pt how to get her pain/muscle trigger points under better control- cannot afford her trp injections since being charged 300$ per appointment

## 2024-08-17 NOTE — Patient Instructions (Signed)
 Pt is a 58 yr old female with hx of RA, (-) rh factor, thoracic DDD, DM1 with retinopathy- Ac 6.5, migraines without aura, and MI/CABG in past-  Also has Graves disease; and just dx'd with tendinitis in B/L hamstring tendinosis- Here for f/u of chronic pain and trigger point injections .                    Will recheck oral drug screen per clinic policy- since on Tylenol  #3  2. Needs to have elbows at 90 degrees give or take a couple of degrees -  needs to make keyboard lower-   3.  1-2x/week 7pm to 7am-  place patches- on each side of neck- 1 patch needs ot cover C7 bone and both need to cover scalenes  muscles on side of neck- and a third patch below them- covering B/L- upper back muscles.  4. When you take the patch off in AM- muscles are looser and can get deeper into muscle.   5.  GO back to seeing me every 3 months- since on Tylenol  #3 and using just occasionally, can see q3 months not q2 months.

## 2024-08-17 NOTE — Addendum Note (Signed)
 Addended by: GEORGINA BARI CROME on: 08/17/2024 04:18 PM   Modules accepted: Orders

## 2024-08-18 ENCOUNTER — Other Ambulatory Visit: Payer: Self-pay

## 2024-08-19 ENCOUNTER — Ambulatory Visit
Admission: RE | Admit: 2024-08-19 | Discharge: 2024-08-19 | Disposition: A | Discharge status: Home / Self Care | Source: Ambulatory Visit | Attending: Emergency Medicine | Admitting: Emergency Medicine

## 2024-08-19 ENCOUNTER — Other Ambulatory Visit: Payer: Self-pay

## 2024-08-19 VITALS — BP 135/74 | HR 80 | Temp 98.3°F | Resp 18

## 2024-08-19 DIAGNOSIS — R350 Frequency of micturition: Secondary | ICD-10-CM | POA: Diagnosis not present

## 2024-08-19 DIAGNOSIS — H6991 Unspecified Eustachian tube disorder, right ear: Secondary | ICD-10-CM

## 2024-08-19 LAB — POCT URINE DIPSTICK
Bilirubin, UA: NEGATIVE
Blood, UA: NEGATIVE
Glucose, UA: NEGATIVE mg/dL
Ketones, POC UA: NEGATIVE mg/dL
Leukocytes, UA: NEGATIVE
Nitrite, UA: NEGATIVE
POC PROTEIN,UA: NEGATIVE
Spec Grav, UA: 1.015 (ref 1.010–1.025)
Urobilinogen, UA: 0.2 U/dL
pH, UA: 7.5 (ref 5.0–8.0)

## 2024-08-19 MED ORDER — IPRATROPIUM BROMIDE 0.03 % NA SOLN: 2.0000 | Freq: Two times a day (BID) | NASAL | 0 refills | Status: AC

## 2024-08-19 MED ORDER — NITROFURANTOIN MONOHYD MACRO 100 MG PO CAPS: 100.0000 mg | ORAL_CAPSULE | Freq: Two times a day (BID) | ORAL | 0 refills | Status: DC

## 2024-08-19 MED ORDER — TORSEMIDE 20 MG PO TABS
20.0000 mg | ORAL_TABLET | Freq: Every day | ORAL | 1 refills | Status: AC
  Filled 2024-08-19: qty 90, 90d supply, fill #0
  Filled 2024-11-23: qty 90, 90d supply, fill #1

## 2024-08-19 NOTE — ED Provider Notes (Signed)
 [facility]    CSN: [id number] Arrival date & time: 08/19/24  1814      History   Chief Complaint Chief Complaint  Patient presents with   Urinary Frequency    with back pain and pain while urinating - Entered by patient   Otalgia   Dysuria    HPI [name] is a 58 y.o. female.   Patient presents for evaluation of urinary frequency, right lower abdominal pain and bilateral low back pain occurring in for 5 days.  Pain to the back and abdomen are intermittent.  Endorses hot flashes but unsure of presence of fever.  Denies hematuria, vaginal symptoms.  Has not attempted treatment.  Patient endorses intermittent right-sided ear pain over the past 7 days.  Typically has fullness at baseline as she endorses fluid behind the ear.  Denies presence of congestion, fever.  Has attempted ibuprofen.   Past Medical History:  Diagnosis Date   ACUT MI ANTEROLAT WALL SUBSQT EPIS CARE    Acute maxillary sinusitis    ALLERGIC RHINITIS, SEASONAL    Allergy    Anemia    Arthritis    ARTHRITIS, RHEUMATOID    shoulders and hands Enbrel>leg swelling Dr. Dolphus   Atrial fibrillation [facility])    a. after CABG.   Back pain    Blood transfusion without reported diagnosis    Bruxism (teeth grinding)    CAD, ARTERY BYPASS GRAFT    a. DES to RCA in 2010 then LAD occlusion s/p CABG 3 06/07/2009 with LIMA to LAD, reverse SVG to D1, reverse SVG to distal RCA. b. Cath 05/08/2016 slightly hypodense region in the intermediate branch, however she had excellent flow, FFR was normal. Vein graft to PDA and the posterolateral branch is patent, patent LIMA to LAD, occluded SVG to diagonal.   CARPAL TUNNEL SYNDROME, BILATERAL    Cataract    CHOLELITHIASIS    Clotting disorder (HCC)    Contrast media allergy    DERMATITIS, ALLERGIC    DIABETES MELLITUS, TYPE I    on insulin  pump dx'ed age 73 y.o    DIABETIC  RETINOPATHY    GERD (gastroesophageal reflux disease)    Graves' disease    Heart attack  (HCC)    Hiatal hernia    HYPERLIPIDEMIA-MIXED    Hypertension    HYPERTHYROIDISM    Dr. Hosie Balder   IBS (irritable bowel syndrome)    IDA (iron  deficiency anemia)    Infertility, female    Insulin  pump in place    Lymphadenopathy of head and neck    MIGRAINE W/O AURA W/INTRACT W/STATUS MIGRAINOSUS 02/19/2008   PONV (postoperative nausea and vomiting)    Psoriasis    Rheumatoid arthritis (HCC)    SINUS TACHYCARDIA 11/08/2010   Sleep apnea    not using machine yet    Stomach ulcer    SVT (supraventricular tachycardia) (HCC)    after s/p CABG   Tendonitis    TRIGGER FINGER    all fingers b/l hands    URI     Patient Active Problem List   Diagnosis Date Noted   Bronchospasm 04/21/2024   Antibiotic-induced yeast infection 02/13/2024   Acute bronchitis 12/23/2023   Abnormal CBC 12/12/2023   H/O Graves' disease 12/11/2023   Mixed diabetic hyperlipidemia associated with type 1 diabetes mellitus (HCC) 12/11/2023   Dysfunction of both eustachian tubes 11/01/2023   Lower respiratory infection 08/30/2023   Lower extremity edema 06/12/2023   Myofascial pain dysfunction syndrome 03/22/2023  Left foot pain 12/05/2022   Urinary urgency 10/23/2022   Pyelonephritis 10/23/2022   Premature atrial contractions 05/15/2022   PVC's (premature ventricular contractions) 05/15/2022   Low hemoglobin 01/16/2022   Otalgia of right ear 01/09/2022   Sore throat 01/09/2022   OSA on CPAP 08/02/2021   Abnormal MRI, lumbar spine 03/20/2021   Abnormal MRI, thoracic spine 03/20/2021   Chronic mid back pain 03/17/2021   Chronic midline low back pain with left-sided sciatica 03/17/2021   Type 1 diabetes mellitus with diabetic polyneuropathy (HCC) 02/17/2021   Severe obstructive sleep apnea-hypopnea syndrome 02/17/2021   Chronic diastolic CHF (congestive heart failure) (HCC) 12/19/2020   Chronic back pain 12/19/2020   Chronic abdominal pain 12/19/2020   Acute diastolic heart failure (HCC)  87/89/7978   Diabetic retinopathy associated with type 1 diabetes mellitus (HCC) 09/27/2020   Diabetic retinopathy (HCC) 09/27/2020   Graves' disease 09/27/2020   Chronic coronary artery disease 09/27/2020   Hypertension associated with diabetes (HCC) 09/14/2020   Class 1 obesity 09/14/2020   Preventative health care 09/14/2020   Aortic atherosclerosis (HCC) 09/14/2020   Snoring 05/12/2020   Lung nodule 03/10/2020   Colitis 03/10/2020   Type 1 diabetes mellitus with proliferative retinopathy (HCC) 12/10/2019   Abnormal thyroid  function test 12/10/2019   Bruxism (teeth grinding)    Migraine 10/07/2019   Contusion of left hip 07/25/2019   H/O multiple allergies 03/10/2019   Hx of anaphylaxis 03/10/2019   Cough 06/26/2018   PND (post-nasal drip) 06/26/2018   Lumbar strain, initial encounter 04/10/2018   Thoracic myofascial strain 04/10/2018   Vitamin D  deficiency 07/04/2017   B12 deficiency 07/04/2017   Abnormal CT scan 06/19/2017   Gastroesophageal reflux disease 06/19/2017   Antiplatelet or antithrombotic long-term use 06/19/2017   Nasal congestion 03/15/2017   Graves disease 02/01/2017   Sleep difficulties 02/01/2017   No energy 02/01/2017   Osteopenia 02/01/2017   Bilateral hand pain 07/16/2016   Acquired trigger finger 07/16/2016   Hypertension, essential 05/08/2016   Right foot pain 08/30/2014   Shoulder pain, right 08/30/2014   Chronic pain syndrome 01/15/2014   Multiple joint pain 01/15/2014   Lesion of lower eyelid 04/21/2013   Generalized constriction of visual field 03/31/2013   Neoplasm of uncertain behavior of skin of eyelid 03/31/2013   Posterior capsular opacification 03/31/2013   Cyst of left lower eyelid 03/30/2013   Pseudophakia of both eyes 03/30/2013   Prurigo nodularis 02/05/2013   Stasis dermatitis 02/05/2013   Psoriasis 01/21/2013   Trochanteric bursitis 11/10/2012   Achilles tendinitis 07/11/2012   Epiretinal membrane 06/17/2012   Status post  cataract extraction 04/30/2012   Pes equinus, acquired 04/23/2012   Tendinitis of ankle 04/23/2012   Proliferative diabetic retinopathy of both eyes (HCC) 01/11/2012   Ongoing use of possibly toxic medication 12/05/2011   Hyperthyroidism 11/09/2010   SINUS TACHYCARDIA 11/08/2010   Shortness of breath 11/08/2010   Acute non-recurrent maxillary sinusitis 10/16/2010   DERMATITIS, ALLERGIC 07/20/2010   EDEMA 07/12/2010   Dizziness 11/14/2009   ATRIAL FIBRILLATION 07/20/2009   Hx of CABG 06/07/2009   ANGINA, STABLE/EXERTIONAL 06/02/2009   Dyslipidemia, goal LDL below 70 04/26/2009   ACUT MI ANTEROLAT WALL SUBSQT EPIS CARE 04/26/2009   Chest pain 04/26/2009   DIABETIC  RETINOPATHY 04/25/2009   CARPAL TUNNEL SYNDROME, BILATERAL 04/25/2009   TRIGGER FINGER 04/25/2009   MIGRAINE W/O AURA W/INTRACT W/STATUS MIGRAINOSUS 02/19/2008   URI with cough and congestion 01/24/2007   Type 1 diabetes mellitus with complications (HCC) 10/16/2006  ALLERGIC RHINITIS, SEASONAL 10/16/2006   CHOLELITHIASIS 10/16/2006   Rheumatoid arthritis (HCC) 10/16/2006    Past Surgical History:  Procedure Laterality Date   ABDOMINAL HYSTERECTOMY     endometriomas b/l; total ? cervix removed; no h/o abnormal pap   Caesarean section     CARDIAC CATHETERIZATION N/A 05/08/2016   Procedure: Left Heart Cath and Cors/Grafts Angiography;  Surgeon: Lonni JONETTA Cash, MD;  Location: [facility] INVASIVE CV LAB;  Service: Cardiovascular;  Laterality: N/A;   CARPAL TUNNEL RELEASE     b/l    CARPAL TUNNEL RELEASE Left 06/06/2021   Procedure: CARPAL TUNNEL RELEASE;  Surgeon: Murrell Kuba, MD;  Location: [facility];  Service: Orthopedics;  Laterality: Left;  AXILLARY BLOCK   CATARACT EXTRACTION, BILATERAL     CESAREAN SECTION     CHOLECYSTECTOMY     COLONOSCOPY WITH PROPOFOL  N/A 03/25/2020   Procedure: COLONOSCOPY WITH PROPOFOL ;  Surgeon: Therisa Bi, MD;  Location: [facility] ENDOSCOPY;  Service: Gastroenterology;   Laterality: N/A;   CORONARY ARTERY BYPASS GRAFT     ESOPHAGOGASTRODUODENOSCOPY (EGD) WITH PROPOFOL  N/A 03/25/2020   Procedure: ESOPHAGOGASTRODUODENOSCOPY (EGD) WITH PROPOFOL ;  Surgeon: Therisa Bi, MD;  Location: [facility] ENDOSCOPY;  Service: Gastroenterology;  Laterality: N/A;   EYE SURGERY     laser x 2 for retinopathy    LEFT HEART CATH AND CORS/GRAFTS ANGIOGRAPHY N/A 02/22/2020   Procedure: LEFT HEART CATH AND CORS/GRAFTS ANGIOGRAPHY;  Surgeon: Cash Lonni JONETTA, MD;  Location: [facility] INVASIVE CV LAB;  Service: Cardiovascular;  Laterality: N/A;   TRIGGER FINGER RELEASE     b/l fingers all    ULNAR NERVE TRANSPOSITION Left 06/06/2021   Procedure: ULNAR NERVE DECOMPRESSION;  Surgeon: Murrell Kuba, MD;  Location: [facility];  Service: Orthopedics;  Laterality: Left;  AXILLARY BLOCK   VITRECTOMY     b/l     OB History   No obstetric history on file.      Home Medications    Prior to Admission medications   Medication Sig Start Date End Date Taking? Authorizing Provider  ipratropium (ATROVENT ) 0.03 % nasal spray Place 2 sprays into both nostrils every 12 (twelve) hours. 08/19/24  Yes Valgene Deloatch R, NP  nitrofurantoin , macrocrystal-monohydrate, (MACROBID ) 100 MG capsule Take 1 capsule (100 mg total) by mouth 2 (two) times daily. 08/19/24  Yes Evangelos Paulino R, NP  acetaminophen -codeine  (TYLENOL  #3) 300-30 MG tablet Take 1 tablet by mouth 2 (two) times daily as needed for moderate pain (pain score 4-6). 08/17/24   Lovorn, Megan, MD  albuterol  (VENTOLIN  HFA) 108 (90 Base) MCG/ACT inhaler Inhale 2 puffs into the lungs every 4 (four) hours as needed.    [provider]  Albuterol -Budesonide  (AIRSUPRA ) 90-80 MCG/ACT AERO Inhale 2 puffs into the lungs every 6 (six) hours. 02/18/24   [name] Dedra CROME, MD  Ascorbic Acid 500 MG CAPS Take by mouth. 10/21/13   [provider]  aspirin  EC 81 MG tablet     [provider]  Blood Glucose Monitoring Suppl  (FREESTYLE FREEDOM LITE) w/Device KIT Use as directed by physician to check blood sugar 02/27/22     CHELATED MAGNESIUM PO Take 250 mg by mouth daily.    [provider]  Cholecalciferol (VITAMIN D3) 125 MCG (5000 UT) CAPS Decrease to 1 tab every other day 12/11/23   Midge Sober, DO  clopidogrel  (PLAVIX ) 75 MG tablet TAKE 1 TABLET BY MOUTH ONCE DAILY 01/16/24   Wonda Sharper, MD  Cyanocobalamin  (VITAMIN B-12 PO) Take by mouth.  [provider]  cyclobenzaprine  (FLEXERIL ) 10 MG tablet Take 1 tablet (10 mg total) by mouth at bedtime as needed for muscle spasms. 07/17/24   Dolphus Reiter, MD  EPINEPHrine  0.3 mg/0.3 mL IJ SOAJ injection Inject 0.3 mg into the muscle as needed for anaphylaxis. 11/14/21   McLean-Scocuzza, Randine SAILOR, MD  fluticasone  (FLONASE ) 50 MCG/ACT nasal spray Place 2 sprays into both nostrils daily. 11/27/23   Wendee Lynwood HERO, NP  fluticasone -salmeterol (ADVAIR ) 100-50 MCG/ACT AEPB Inhale 1 puff into the lungs 2 (two) times daily. 02/13/24   Wendee Lynwood HERO, NP  Glucagon  (GVOKE HYPOPEN  2-PACK) 0.5 MG/0.1ML SOAJ Inject 0.5 mg into the skin as needed (Hypoglycemic episode). 06/24/24   Trixie File, MD  glucose blood (FREESTYLE LITE) test strip Use to check blood sugar 3 time(s) daily 09/26/23   Trixie File, MD  glucose blood test strip     [provider]  guaiFENesin -codeine  100-10 MG/5ML syrup Take 5 mLs by mouth at bedtime. 02/06/24   Kaur, Charanpreet, NP  insulin  detemir (LEVEMIR ) 100 UNIT/ML injection Inject 36 units into the skin once daily in the event of insulin  pump failure. 07/09/22     insulin  lispro (HUMALOG ) 100 UNIT/ML injection Inject 0.9-1.2 mLs (90-120 Units total) into the skin daily. 05/11/24   Trixie File, MD  Insulin  Syringe-Needle U-100 (BD INSULIN  SYRINGE U/F) 31G X 5/16 0.3 ML MISC use with insulin  injections 3 times daily in the event of insulin  pump failure. 11/22/21     isosorbide  mononitrate (IMDUR ) 60 MG 24 hr  tablet Take 1 tablet (60 mg total) by mouth daily. 03/26/24   Wonda Sharper, MD  Anselm Oil 500 MG CAPS Take 500 mg by mouth daily.    [provider]  Lancets (FREESTYLE) lancets Use to check blood sugar 3 time(s) daily. 02/27/22     leflunomide  (ARAVA ) 20 MG tablet Take 1 tablet (20 mg total) by mouth daily. 06/24/24   Cheryl Waddell HERO, PA-C  linaclotide  (LINZESS ) 290 MCG CAPS capsule Take 1 capsule (290 mcg total) by mouth daily before breakfast. 08/14/24   Wendee Lynwood HERO, NP  losartan  (COZAAR ) 100 MG tablet Take 1 tablet (100 mg total) by mouth daily. 04/14/24   Cooper, Michael, MD  metoprolol  tartrate (LOPRESSOR ) 50 MG tablet Take 0.5 tablets (25 mg total) by mouth 2 (two) times daily. 03/02/24   Wonda Sharper, MD  montelukast  (SINGULAIR ) 10 MG tablet Take 1 tablet (10 mg total) by mouth at bedtime. 02/26/24   Wendee Lynwood HERO, NP  Multiple Vitamin (MULTIVITAMIN) capsule Take 1 capsule by mouth daily.    [provider]  neomycin -polymyxin b-dexamethasone  (MAXITROL ) 3.5-10000-0.1 SUSP Place 1 drop into both eyes 4 (four) times daily. 04/07/24     ondansetron  (ZOFRAN ) 4 MG tablet Take 1 tablet (4 mg total) by mouth every 8 (eight) hours as needed for nausea or vomiting. Patient taking differently: Take 4 mg by mouth as needed for nausea or vomiting. 02/10/24   Lovorn, Megan, MD  pantoprazole  (PROTONIX ) 40 MG tablet Take 1 tablet (40 mg total) by mouth 2 (two) times daily. 01/08/24 01/07/25  Therisa Bi, MD  potassium chloride  SA (KLOR-CON  M20) 20 MEQ tablet Take 1 tablet (20 mEq total) by mouth daily. 08/21/23   Wonda Sharper, MD  rosuvastatin  (CRESTOR ) 20 MG tablet Take 1 tablet (20 mg total) by mouth daily. 03/13/24   Wonda Sharper, MD  torsemide  (DEMADEX ) 20 MG tablet Take 1 tablet (20 mg total) by mouth daily. 08/19/24   Wonda Sharper,  MD  Turmeric (QC TUMERIC COMPLEX PO) Take 1,000 mg by mouth.    [provider]  Upadacitinib  ER (RINVOQ ) 15 MG TB24 Take 1 tablet (15 mg) by  mouth daily. 05/29/24   Dolphus Reiter, MD    Family History Family History  Problem Relation Age of Onset   Depression Mother    Anxiety disorder Mother    Hypertension Mother    Hyperlipidemia Mother    Thyroid  disease Mother    Bipolar disorder Mother    Sleep apnea Mother    Eating disorder Mother    Colon polyps Father    Diabetes Father        2   Hypertension Father    Parkinson's disease Father    Hyperlipidemia Father    Depression Father    Sleep apnea Father    Obesity Father    Stroke Paternal Grandmother    Heart disease Other    Hypertension Other    Hyperlipidemia Other    Depression Other    Migraines Other    Heart attack Neg Hx    Colon cancer Neg Hx    Stomach cancer Neg Hx    Esophageal cancer Neg Hx     Social History Social History   Tobacco Use   Smoking status: Never    Passive exposure: Never   Smokeless tobacco: Never  Vaping Use   Vaping status: Never Used  Substance Use Topics   Alcohol use: Yes    Comment: rarely 1 every 6 months   Drug use: No     Allergies   Actemra  [tocilizumab ], Ramipril, Shellfish-derived products, Atorvastatin, Compazine  [prochlorperazine edisylate], Emgality  [galcanezumab -gnlm], Etanercept, Infliximab, Iohexol , Orencia [abatacept], Prochlorperazine edisylate, Tofacitinib, Trokendi  xr [topiramate  er], Shrimp extract, Topiramate , Tramadol , Amiodarone, and Rituximab   Review of Systems Review of Systems   Physical Exam Triage Vital Signs ED Triage Vitals  Encounter Vitals Group     BP 08/19/24 1829 135/74     Girls Systolic BP Percentile --      Girls Diastolic BP Percentile --      Boys Systolic BP Percentile --      Boys Diastolic BP Percentile --      Pulse Rate 08/19/24 1829 80     Resp 08/19/24 1829 18     Temp 08/19/24 1829 98.3 F (36.8 C)     Temp Source 08/19/24 1829 Oral     SpO2 08/19/24 1829 97 %     Weight --      Height --      Head Circumference --      Peak Flow --       Pain Score 08/19/24 1825 7     Pain Loc --      Pain Education --      Exclude from Growth Chart --    No data found.  Updated Vital Signs BP 135/74 (BP Location: Right Arm)   Pulse 80   Temp 98.3 F (36.8 C) (Oral)   Resp 18   SpO2 97%   Visual Acuity Right Eye Distance:   Left Eye Distance:   Bilateral Distance:    Right Eye Near:   Left Eye Near:    Bilateral Near:     Physical Exam Constitutional:      Appearance: Normal appearance.  HENT:     Right Ear: Ear canal and external ear normal. A middle ear effusion is present.     Left Ear: Tympanic membrane, ear canal and  external ear normal.  Pulmonary:     Effort: Pulmonary effort is normal.  Abdominal:     General: Abdomen is flat. Bowel sounds are normal.     Palpations: Abdomen is soft.     Tenderness: There is no abdominal tenderness. There is right CVA tenderness. There is no left CVA tenderness or guarding.  Neurological:     Mental Status: She is alert and oriented to person, place, and time. Mental status is at baseline.      UC Treatments / Results  Labs (all labs ordered are listed, but only abnormal results are displayed) Labs Reviewed  URINE CULTURE  POCT URINE DIPSTICK    EKG   Radiology No results found.  Procedures Procedures (including critical care time)  Medications Ordered in UC Medications - No data to display  Initial Impression / Assessment and Plan / UC Course  I have reviewed the triage vital signs and the nursing notes.  Pertinent labs & imaging results that were available during my care of the patient were reviewed by me and considered in my medical decision making (see chart for details).  Urinary frequency, right eustachian tube dysfunction  Urinalysis negative, sent for culture, discussed findings with patient, empirically placed on Macrobid  as she is symptomatic, recommend over-the-counter medications and nonpharmacological supportive care, may follow-up if symptoms  continue to persist worsen or recur, advised PCP follow-up if urine culture is negative and she is still symptomatic  Fluid behind the right ear consistent with eustachian tube dysfunction, no signs of infection, discussed findings with patient, prescribed Atrovent  nasal spray she has been using Flonase  without relief, recommended additional supportive care with mucolytic's and decongestants with follow-up if symptoms continue to persist or worsen    Final Clinical Impressions(s) / UC Diagnoses   Final diagnoses:  Urinary frequency  Eustachian tube dysfunction, right     Discharge Instructions      Your urinalysis shows negative, your urine will be sent to the lab to determine exactly which bacteria is present, if any changes need to be made to your medications you will be notified  Begin use of Macrobid  twice a day for 5 days   You may use over-the-counter Azo to help minimize your symptoms until antibiotic removes bacteria, this medication will turn your urine orange  Increase your fluid intake through use of water  As always practice good hygiene, wiping front to back and avoidance of scented vaginal products to prevent further irritation  If symptoms continue to persist after use of medication or recur please follow-up with urgent care or your primary doctor as needed  On exam there is no abnormality to the ear indicating infection in appears to be fluid behind the ear canal, and you may use nasal spray twice a daily in addition to over-the-counter decongestants and mucolytic such as Mucinex  or Sudafed to further your symptoms    ED Prescriptions     Medication Sig Dispense Auth. Provider   ipratropium (ATROVENT ) 0.03 % nasal spray Place 2 sprays into both nostrils every 12 (twelve) hours. 30 mL Lakita Sahlin R, NP   nitrofurantoin , macrocrystal-monohydrate, (MACROBID ) 100 MG capsule Take 1 capsule (100 mg total) by mouth 2 (two) times daily. 10 capsule Jerrica Thorman R,  NP      PDMP not reviewed this encounter.   Teresa Shelba SAUNDERS, NP 08/19/24 1901

## 2024-08-19 NOTE — Discharge Instructions (Signed)
 Your urinalysis shows negative, your urine will be sent to the lab to determine exactly which bacteria is present, if any changes need to be made to your medications you will be notified  Begin use of Macrobid  twice a day for 5 days   You may use over-the-counter Azo to help minimize your symptoms until antibiotic removes bacteria, this medication will turn your urine orange  Increase your fluid intake through use of water  As always practice good hygiene, wiping front to back and avoidance of scented vaginal products to prevent further irritation  If symptoms continue to persist after use of medication or recur please follow-up with urgent care or your primary doctor as needed  On exam there is no abnormality to the ear indicating infection in appears to be fluid behind the ear canal, and you may use nasal spray twice a daily in addition to over-the-counter decongestants and mucolytic such as Mucinex  or Sudafed to further your symptoms

## 2024-08-19 NOTE — ED Triage Notes (Signed)
 Patient reports urinary frequency, urinary pain and flank pain. Patient has not taken anything for symptoms. Rates flank pain 7/10. Patient also complains right ear pain x 1 week . Rates ear pain 5/10.

## 2024-08-20 ENCOUNTER — Other Ambulatory Visit: Payer: Self-pay

## 2024-08-20 ENCOUNTER — Ambulatory Visit: Admitting: Podiatry

## 2024-08-20 LAB — URINE CULTURE: Culture: <10000 — AB

## 2024-08-21 ENCOUNTER — Encounter: Payer: Self-pay | Admitting: Podiatry

## 2024-08-21 ENCOUNTER — Other Ambulatory Visit: Payer: Self-pay

## 2024-08-21 ENCOUNTER — Ambulatory Visit (HOSPITAL_COMMUNITY): Payer: Self-pay

## 2024-08-21 ENCOUNTER — Other Ambulatory Visit: Payer: Self-pay | Admitting: Rheumatology

## 2024-08-21 DIAGNOSIS — M0609 Rheumatoid arthritis without rheumatoid factor, multiple sites: Secondary | ICD-10-CM

## 2024-08-21 LAB — DRUG TOX MONITOR 1 W/CONF, ORAL FLD
Amphetamines: NEGATIVE ng/mL (ref <10)
Barbiturates: NEGATIVE ng/mL (ref <10)
Benzodiazepines: NEGATIVE ng/mL (ref <0.50)
Buprenorphine: NEGATIVE ng/mL (ref <0.10)
Cocaine: NEGATIVE ng/mL (ref <5.0)
Codeine: NEGATIVE ng/mL (ref <2.5)
Dihydrocodeine: NEGATIVE ng/mL (ref <2.5)
Fentanyl: NEGATIVE ng/mL (ref <0.10)
Heroin Metabolite: NEGATIVE ng/mL (ref <1.0)
Hydrocodone: NEGATIVE ng/mL (ref <2.5)
Hydromorphone: NEGATIVE ng/mL (ref <2.5)
MARIJUANA: NEGATIVE ng/mL (ref <2.5)
MDMA: NEGATIVE ng/mL (ref <10)
Meprobamate: NEGATIVE ng/mL (ref <2.5)
Methadone: NEGATIVE ng/mL (ref <5.0)
Morphine: NEGATIVE ng/mL (ref <2.5)
Nicotine Metabolite: NEGATIVE ng/mL (ref <5.0)
Norhydrocodone: NEGATIVE ng/mL (ref <2.5)
Noroxycodone: NEGATIVE ng/mL (ref <2.5)
Opiates: NEGATIVE ng/mL (ref <2.5)
Oxycodone: NEGATIVE ng/mL (ref <2.5)
Oxymorphone: NEGATIVE ng/mL (ref <2.5)
Phencyclidine: NEGATIVE ng/mL (ref <10)
Tapentadol: NEGATIVE ng/mL (ref <5.0)
Tramadol: NEGATIVE ng/mL (ref <5.0)
Zolpidem: NEGATIVE ng/mL (ref <5.0)

## 2024-08-21 LAB — DRUG TOX ALC METAB W/CON, ORAL FLD: Alcohol Metabolite: NEGATIVE ng/mL (ref <25)

## 2024-08-21 MED ORDER — RINVOQ 15 MG PO TB24
15.0000 mg | ORAL_TABLET | Freq: Every day | ORAL | 0 refills | Status: DC
  Filled 2024-08-21: qty 90, 90d supply, fill #0
  Filled 2024-08-24: qty 30, 30d supply, fill #0
  Filled 2024-09-29: qty 30, 30d supply, fill #1
  Filled 2024-10-29: qty 30, 30d supply, fill #2

## 2024-08-21 NOTE — Telephone Encounter (Signed)
 Last Fill: 05/29/2024  Labs: 06/24/2024 CBC WNL 07/01/2024  CMP is normal, glucose is mildly elevated at 125 probably not a fasting sample.   TB Gold: 10/19/2023 Neg    Next Visit: 10/14/2024  Last Visit: 05/11/2024  IK:Myzlfjunpi arthritis of multiple sites with negative rheumatoid factor   Current Dose per office note 05/11/2024: Rinvoq  15 mg 1 tablet by mouth once daily   Okay to refill Rinvoq ?

## 2024-08-24 ENCOUNTER — Encounter (INDEPENDENT_AMBULATORY_CARE_PROVIDER_SITE_OTHER): Payer: Self-pay

## 2024-08-24 ENCOUNTER — Other Ambulatory Visit: Payer: Self-pay

## 2024-08-24 ENCOUNTER — Other Ambulatory Visit: Payer: Self-pay | Admitting: Nurse Practitioner

## 2024-08-24 NOTE — Progress Notes (Signed)
 Specialty Pharmacy Refill Coordination Note  [name] is a 58 y.o. female contacted today regarding refills of specialty medication(s) Upadacitinib  (Rinvoq )   Patient requested (Patient-Rptd) Delivery   Delivery date: 09/02/24   Verified address: (Patient-Rptd) [address] [name] [address]   Medication will be filled on 09/01/24.

## 2024-08-25 ENCOUNTER — Ambulatory Visit: Admitting: Podiatry

## 2024-08-25 ENCOUNTER — Ambulatory Visit (INDEPENDENT_AMBULATORY_CARE_PROVIDER_SITE_OTHER): Admitting: Podiatry

## 2024-08-25 DIAGNOSIS — M7918 Myalgia, other site: Secondary | ICD-10-CM | POA: Diagnosis not present

## 2024-08-25 DIAGNOSIS — M65971 Unspecified synovitis and tenosynovitis, right ankle and foot: Secondary | ICD-10-CM | POA: Diagnosis not present

## 2024-08-25 DIAGNOSIS — M722 Plantar fascial fibromatosis: Secondary | ICD-10-CM | POA: Diagnosis not present

## 2024-08-25 DIAGNOSIS — G894 Chronic pain syndrome: Secondary | ICD-10-CM | POA: Diagnosis not present

## 2024-08-25 DIAGNOSIS — Z79891 Long term (current) use of opiate analgesic: Secondary | ICD-10-CM | POA: Diagnosis not present

## 2024-08-25 DIAGNOSIS — Z5181 Encounter for therapeutic drug level monitoring: Secondary | ICD-10-CM | POA: Diagnosis not present

## 2024-08-25 DIAGNOSIS — G8929 Other chronic pain: Secondary | ICD-10-CM | POA: Diagnosis not present

## 2024-08-25 DIAGNOSIS — G43011 Migraine without aura, intractable, with status migrainosus: Secondary | ICD-10-CM | POA: Diagnosis not present

## 2024-08-25 DIAGNOSIS — E108 Type 1 diabetes mellitus with unspecified complications: Secondary | ICD-10-CM | POA: Diagnosis not present

## 2024-08-25 LAB — SYNOVIAL CELL COUNT + DIFF, W/ CRYSTALS
Crystals, Fluid: NONE SEEN
WBC, Synovial: UNDETERMINED /mm3 (ref 0–200)

## 2024-08-25 NOTE — Patient Instructions (Signed)

## 2024-08-26 ENCOUNTER — Other Ambulatory Visit: Payer: Self-pay

## 2024-08-26 MED ORDER — METOPROLOL TARTRATE 50 MG PO TABS
25.0000 mg | ORAL_TABLET | Freq: Two times a day (BID) | ORAL | 0 refills | Status: DC
Start: 2024-08-26 — End: 2024-09-28
  Filled 2024-09-02: qty 30, 30d supply, fill #0

## 2024-08-27 ENCOUNTER — Ambulatory Visit: Admitting: Podiatry

## 2024-08-29 ENCOUNTER — Ambulatory Visit: Payer: Self-pay | Admitting: Podiatry

## 2024-08-29 NOTE — Progress Notes (Signed)
 Subjective:  Patient ID: [name], female    DOB: [birth year],  MRN: [id number]  Chief Complaint  Patient presents with   Foot Pain    Rm 4 Patient is here to discuss MRI reults.    Discussed the use of AI scribe software for clinical note transcription with the patient, who gave verbal consent to proceed.  History of Present Illness [name] is a 58 year old female with type 1 diabetes and rheumatoid arthritis who presents with bilateral foot pain and swelling.  She experiences bilateral foot pain and swelling, with the right foot being more severely affected. The pain is significant in the middle three toes and heel of the left foot, and there is swelling in the right foot. The symptoms have persisted for approximately six weeks and are worsening, causing sleep disturbances despite taking Tylenol  3 at night. She has not used a walking boot or taken any specific medication for the foot pain, aside from Tylenol  3 for her back and neck issues.  No recent injuries to her feet have occurred, and she has not experienced burning or tingling sensations, indicating no neuropathy symptoms. A previous monofilament test did not show any sensory neuropathy. She has a history of a soft tissue tumor removal from the top of her foot, which was surgically excised due to its size.  Her past medical history includes type 1 diabetes since age 58, with a current A1c of 6.1, and rheumatoid arthritis, for which she is on Rinvoq . She mentions generalized joint pain and swelling, particularly in her ankle recently, and has received steroid treatment without significant improvement. No kidney function issues or history of sciatica are reported.  Recent radiographs showed arterial calcifications in the dorsal and plantar vasculature, with no evidence of acute stress fractures or bony callus.  She has an allergy to shellfish and iodinated contrast dye from CT scans, but has tolerated premedication in the  past.  Interval history: She returns for follow-up with minimal change in symptoms and had completed the MRI  Objective:    Physical Exam VASCULAR: DP and PT pulse palpable. Foot is warm and well-perfused. Capillary fill time is brisk. DERMATOLOGIC: Normal skin turgor, texture, and temperature. No open lesions, rashes, or ulcerations. NEUROLOGIC: Normal sensation to light touch and pressure. No paresthesias on examination. Numbness and radiating pain with palpation of dorsal cutaneous nerves on dorsal midfoot, less so at the ankle and none at the perineal nerve. ORTHOPEDIC: Majority of pain and edema around the right second metatarsophalangeal joint today       Results LABS A1c: 6.1%  RADIOLOGY Foot X-ray: Arterial calcifications of the dorsal and plantar vasculature. No evidence of acute stress fracture or fracture. No bony callus noted. Pes cavus foot type with metatarsus adductus, some dorsal spurring in the midfoot. (07/07/2024)  MRI right foot 08/13/2024 IMPRESSION: 1. Periarticular marrow edema on either side of the second MTP joint. Small second MTP joint effusion and synovitis. No erosive changes. Findings are nonspecific but can be seen in the setting of an inflammatory arthropathy versus stress reaction versus less likely septic arthritis. 2. Mild osteoarthritis of the navicular-medial cuneiform joint with subchondral marrow edema. 3. Mild osteoarthritis of the second TMT joint.     Electronically Signed   By: Julaine Blanch M.D.   On: 08/24/2024 12:52   Assessment:   1. Synovitis of right foot   2. Plantar fasciitis of left foot      Plan:  Patient was evaluated and  treated and all questions answered.  Assessment and Plan Assessment & Plan Foot Pain and Swelling Boot was only partially helpful.  We discussed further treatment options and reviewed her MRI.  No evidence of plantar plate tear with synovitis present.  Discussed treatment options including  corticosteroid injection.  She elected to proceed with this today.  Following verbal consent a local field block was performed with lidocaine  and Marcaine  proximal to the second metatarsal.  Following this the second MTPJ was prepped with Betadine and aspiration attempted.  A small amount of bloody fluid was aspirated.  There is no evidence of septic arthritis or purulence.  10 mg of Kenalog  and 2.5 mg of Marcaine  were injected into the second MTPJ from a dorsal approach.  She tolerated well and was dressed with a bandage.  The fluid was sent for cell counts    Regarding her plantar fasciitis symptoms are recommended home physical therapy plan.  This was dispensed.  Performed twice daily.  We will reevaluate this at the next visit as well   Return in about 1 month (around 09/25/2024) for recheck plantar fasciitis, injection, stress fx.

## 2024-09-01 ENCOUNTER — Other Ambulatory Visit: Payer: Self-pay

## 2024-09-02 ENCOUNTER — Encounter: Payer: Self-pay | Admitting: Nurse Practitioner

## 2024-09-02 ENCOUNTER — Encounter: Payer: Self-pay | Admitting: *Deleted

## 2024-09-02 ENCOUNTER — Encounter: Payer: Self-pay | Admitting: Cardiovascular Disease

## 2024-09-02 ENCOUNTER — Telehealth: Payer: Self-pay | Admitting: *Deleted

## 2024-09-02 ENCOUNTER — Other Ambulatory Visit: Payer: Self-pay

## 2024-09-02 ENCOUNTER — Encounter: Payer: Self-pay | Admitting: Rheumatology

## 2024-09-02 ENCOUNTER — Ambulatory Visit: Attending: Nurse Practitioner | Admitting: Nurse Practitioner

## 2024-09-02 VITALS — BP 149/79 | HR 81 | Resp 16 | Ht 62.0 in | Wt 182.0 lb

## 2024-09-02 DIAGNOSIS — I10 Essential (primary) hypertension: Secondary | ICD-10-CM | POA: Diagnosis not present

## 2024-09-02 DIAGNOSIS — E782 Mixed hyperlipidemia: Secondary | ICD-10-CM

## 2024-09-02 DIAGNOSIS — Z01818 Encounter for other preprocedural examination: Secondary | ICD-10-CM

## 2024-09-02 DIAGNOSIS — I2510 Atherosclerotic heart disease of native coronary artery without angina pectoris: Secondary | ICD-10-CM | POA: Diagnosis not present

## 2024-09-02 NOTE — Progress Notes (Signed)
 Cardiology Office Note    Patient Name: [name] Date of Encounter: 09/02/2024  Primary Care Provider:  Wendee Lynwood HERO, NP Primary Cardiologist:  [name], MD Primary Electrophysiologist: None   Past Medical History    Past Medical History:  Diagnosis Date   ACUT MI ANTEROLAT WALL SUBSQT EPIS CARE    Acute maxillary sinusitis    ALLERGIC RHINITIS, SEASONAL    Allergy    Anemia    Arthritis    ARTHRITIS, RHEUMATOID    shoulders and hands Enbrel>leg swelling Dr. Dolphus   Atrial fibrillation [facility])    a. after CABG.   Back pain    Blood transfusion without reported diagnosis    Bruxism (teeth grinding)    CAD, ARTERY BYPASS GRAFT    a. DES to RCA in 2010 then LAD occlusion s/p CABG 3 06/07/2009 with LIMA to LAD, reverse SVG to D1, reverse SVG to distal RCA. b. Cath 05/08/2016 slightly hypodense region in the intermediate branch, however she had excellent flow, FFR was normal. Vein graft to PDA and the posterolateral branch is patent, patent LIMA to LAD, occluded SVG to diagonal.   CARPAL TUNNEL SYNDROME, BILATERAL    Cataract    CHOLELITHIASIS    Clotting disorder (HCC)    Contrast media allergy    DERMATITIS, ALLERGIC    DIABETES MELLITUS, TYPE I    on insulin  pump dx'ed age 41 y.o    DIABETIC  RETINOPATHY    GERD (gastroesophageal reflux disease)    Graves' disease    Heart attack (HCC)    Hiatal hernia    HYPERLIPIDEMIA-MIXED    Hypertension    HYPERTHYROIDISM    Dr. Hosie Balder   IBS (irritable bowel syndrome)    IDA (iron  deficiency anemia)    Infertility, female    Insulin  pump in place    Lymphadenopathy of head and neck    MIGRAINE W/O AURA W/INTRACT W/STATUS MIGRAINOSUS 02/19/2008   PONV (postoperative nausea and vomiting)    Psoriasis    Rheumatoid arthritis (HCC)    SINUS TACHYCARDIA 11/08/2010   Sleep apnea    not using machine yet    Stomach ulcer    SVT (supraventricular tachycardia) (HCC)    after s/p CABG   Tendonitis    TRIGGER  FINGER    all fingers b/l hands    URI     History of Present Illness  [name] is a 58 y.o. female with a PMH of CAD s/p CABG x 3, type 1 diabetes, HTN, Graves' disease, HLD, rheumatoid arthritis, GERD, and chronic anemia who presents today for preoperative clearance.  She was last seen by Dr. Fell in 08/21/2023 for complaint of edema.  She had an updated 2D echo for further evaluation that showed normal LV function with no significant valve disease.  She is currently on DAPT and torsemide  20 mg daily.  She was seen in the ED on 08/19/2024 with urinary frequency and back pain.  She is pending bilateral blepharoplasty and reports today for clearance.  he is scheduled for a procedure on September 12th and has a history of cardiac issues. No current cardiac symptoms such as chest pain, chest tightness, or palpitations. She remains active, performing yard work and other activities around the house, and is able to run short distances despite some foot pain. Previously, she was seen for swelling, which has since resolved. At that time, she was on torsemide  and had an echocardiogram. She continues to take aspirin   and Plavix  daily. Her blood sugar levels are well-controlled with an A1c of 6.1. She is currently taking losartan  and macrobic acid. Her blood pressure fluctuates, sometimes reaching 149/70, which she considers on the higher side. No issues with sleep, such as problems laying flat at night, and she manages her fluid volume and salt intake well. No swelling in her ankles and no issues with palpitations or skipped beats. 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  Discussed the use of AI scribe software for clinical note transcription with the patient, who gave verbal consent to proceed.  History of Present Illness    Review of Systems  Please see the history of present illness.    All other systems reviewed and are otherwise  negative except as noted above.  Physical Exam    Wt Readings from Last 3 Encounters:  09/02/24 182 lb (82.6 kg)  08/17/24 182 lb (82.6 kg)  07/28/24 183 lb 6.4 oz (83.2 kg)   VS: Vitals:   09/02/24 1540  BP: (!) 149/79  Pulse: 81  Resp: 16  SpO2: 96%  ,Body mass index is 33.29 kg/m. GEN: Well nourished, well developed in no acute distress Neck: No JVD; No carotid bruits Pulmonary: Clear to auscultation without rales, wheezing or rhonchi  Cardiovascular: Normal rate. Regular rhythm. Normal S1. Normal S2.   Murmurs: There is no murmur.  ABDOMEN: Soft, non-tender, non-distended EXTREMITIES:  No edema; No deformity   EKG/LABS/ Recent Cardiac Studies   ECG personally reviewed by me today -sinus rhythm with rate of 75 bpm no acute changes consistent with previous EKGs.  Risk Assessment/Calculations:          Lab Results  Component Value Date   WBC 4.2 06/24/2024   HGB 11.8 06/24/2024   HCT 35.2 06/24/2024   MCV 88.7 06/24/2024   PLT 234 06/24/2024   Lab Results  Component Value Date   CREATININE 0.77 07/01/2024   BUN 20 07/01/2024   NA 139 07/01/2024   K 4.4 07/01/2024   CL 103 07/01/2024   CO2 24 07/01/2024   Lab Results  Component Value Date   CHOL 162 11/26/2023   HDL 76 11/26/2023   LDLCALC 69 11/26/2023   TRIG 96 11/26/2023   CHOLHDL 2 12/05/2022    Lab Results  Component Value Date   HGBA1C 6.1 (A) 06/09/2024   Assessment & Plan    Assessment & Plan   1.  Preoperative clearance: - Patient's RCRI score is 11% The patient affirms she has been doing well without any new cardiac symptoms. They are able to achieve 7 METS without cardiac limitations. Therefore, based on ACC/AHA guidelines, the patient would be at acceptable risk for the planned procedure without further cardiovascular testing. The patient was advised that if she develops new symptoms prior to surgery to contact our office to arrange for a follow-up visit, and she verbalized  understanding.   - Hold Plavix  for five days prior to procedure on September 11, 2024. - Send surgical clearance note to Dr. Homer at [facility].  2.History of CAD : -s/p CABG x 3 with no current complaints of angina or chest pain at this time - Continue ASA 81 mg, Plavix  75 mg, isosorbide  60 mg, losartan  100 mg, metoprolol  25 mg twice daily, Crestor  20 mg  3.Essential hypertension Blood pressure elevated at 149/70 mmHg. Reports fluctuations with some readings as low as 105/70 mmHg.  4.Type 2 diabetes mellitus without complications  Type 2 diabetes well-controlled with A1c of 6.1%. Knowledgeable about management, works as Oceanographer compliance.    Disposition: Follow-up with [name], MD or APP in 12 months    Signed, Wyn Raddle, Jackee Shove, NP 09/02/2024, 5:30 PM  [facility]

## 2024-09-02 NOTE — Patient Instructions (Signed)
 Medication Instructions:  Your physician recommends that you continue on your current medications as directed. Please refer to the Current Medication list given to you today.  *If you need a refill on your cardiac medications before your next appointment, please call your pharmacy*  Lab Work: None ordered If you have labs (blood work) drawn today and your tests are completely normal, you will receive your results only by: MyChart Message (if you have MyChart) OR A paper copy in the mail If you have any lab test that is abnormal or we need to change your treatment, we will call you to review the results.  Testing/Procedures: None ordered  Follow-Up: At [facility], you and your health needs are our priority.  As part of our continuing mission to provide you with exceptional heart care, our providers are all part of one team.  This team includes your primary Cardiologist (physician) and Advanced Practice Providers or APPs (Physician Assistants and Nurse Practitioners) who all work together to provide you with the care you need, when you need it.  Your next appointment:   12 month(s)  Provider:   Arnoldo Lapping, MD    We recommend signing up for the patient portal called "MyChart".  Sign up information is provided on this After Visit Summary.  MyChart is used to connect with patients for Virtual Visits (Telemedicine).  Patients are able to view lab/test results, encounter notes, upcoming appointments, etc.  Non-urgent messages can be sent to your provider as well.   To learn more about what you can do with MyChart, go to [email or web address].   Other Instructions

## 2024-09-02 NOTE — Telephone Encounter (Signed)
  I s/w the pt about MY CHART message received surgery date has been moved up to 09/11/24 and she is on Plavix  and needs sooner appt.    Jackee Alberts, NP had an opening today at 3:35 pm. Pt is aware of address of [address]    I will update all parties involved.            [name], [name], RN to Cv Div Preop Callback  (Selected Message)     09/02/24  1:33 PM Please assist with pre-op appt and her upcoming surgery  [name] to P Cv Div [facility] Triage (supporting [name], MD)     09/02/24  1:23 PM Good afternoon,    so they scheduled me an appointment for 09/09/2024 but that will not work for stopping my Plavix . I need to be off the Plavix  for 7 days prior to 09/11/24.  Please advise. Thank you [name] to P Cv Div [facility] Triage (supporting [name], MD)     09/02/24  9:53 AM They did move the date sorry for any inconvenience. If I do need to come in I now work at the [employer] building next to your new location.  Thank you   09/02/24  9:49 AM [name], [name], RN routed this conversation to Cv Div Preop  Cv Div Preop Callback [name], [name], RN to [name]     09/02/24  9:49 AM Hi Lisa    It was anticipated that the clearance request for surgery will be addressed at your 09/21/24 visit. I'll alert our pre-op team that your surgery is now sooner and see what can be done.   Last read by [name] at 1:21PM on 09/02/2024. [name] to P C[facility] Div [facility] Triage (supporting [name], MD)     09/02/24  9:28 AM Good morning Dr. Fell   I am having surgery on 09/11/24 and would like to get a letter to hold my Plavixs for 5 days prior to the procedure and a recommendation of when to start back. The office Is [facility] and having the procedure at the [facility] off of [address]  They may have already reached out to you but just making sure so there are no problems. The Fax number to [facility] is [phone], and if it can also  be placed in the letter tab in my chart.  Thank you so much. Have a good day,   Sincerely,  [name]     To   Office of [name] Call 911 if you have an emergency.   Learn more Subject

## 2024-09-02 NOTE — Telephone Encounter (Signed)
 Pt messaged in stating that he Date of the procedure has been moved up. The new date is 09/11/24.  Pt has appt in office 09/21/24 at that time it was originally going to be addressed at that time.  Will route to the Preop APP to review.        [name] to P C[facility] Div [facility] Triage (supporting [name], MD)     09/02/24  9:53 AM They did move the date sorry for any inconvenience. If I do need to come in I now work at the [employer] building next to your new location.  Thank you   09/02/24  9:49 AM [name], [name], RN routed this conversation to Cv Div Preop  Cv Div Preop Callback (Selected Message) [name] HERO, RN to [name]     09/02/24  9:49 AM Hi Lisa    It was anticipated that the clearance request for surgery will be addressed at your 09/21/24 visit. I'll alert our pre-op team that your surgery is now sooner and see what can be done.   Last read by [name] at 9:49AM on 09/02/2024. [name] to P Cv Div [facility] Triage (supporting [name], MD)     09/02/24  9:28 AM Good morning Dr. Fell   I am having surgery on 09/11/24 and would like to get a letter to hold my Plavixs for 5 days prior to the procedure and a recommendation of when to start back. The office Is [facility] and having the procedure at the [facility] off of [address]  They may have already reached out to you but just making sure so there are no problems. The Fax number to [facility] is [phone], and if it can also be placed in the letter tab in my chart.  Thank you so much. Have a good day,   Sincerely,  [name]     To   Office of [name] Call 911 if you have an emergency.   Learn more Subject

## 2024-09-02 NOTE — Telephone Encounter (Signed)
 Spoke with pt and scheduled IN OFFICE preop appt  09/09/24 with Jon Hails, PA

## 2024-09-02 NOTE — Telephone Encounter (Signed)
 I s/w the pt about MY CHART message received surgery date has been moved up to 09/11/24 and she is on Plavix  and needs sooner appt.   Jackee Alberts, NP had an opening today at 3:35 pm. Pt is aware of address of [address]   I will update all parties involved.

## 2024-09-02 NOTE — Telephone Encounter (Signed)
 Patients procedure has been moved up, but had already existing required in office visit. Please reschedule patient for sooner date to accommodate this request or they need to reschedule procedure date.

## 2024-09-03 NOTE — Telephone Encounter (Signed)
 I will re-fax notes from Jackee Alberts, NP who saw pt yesterday. Pt has been cleared, see notes for med hold as well.SABRA

## 2024-09-03 NOTE — Telephone Encounter (Signed)
Office calling to f/u on Clearance. Please advise

## 2024-09-04 ENCOUNTER — Other Ambulatory Visit: Payer: Self-pay

## 2024-09-04 MED ORDER — NEOMYCIN-POLYMYXIN-DEXAMETH 3.5-10000-0.1 OP OINT
TOPICAL_OINTMENT | OPHTHALMIC | 0 refills | Status: DC
  Filled 2024-09-04: qty 3.5, 15d supply, fill #0

## 2024-09-06 ENCOUNTER — Other Ambulatory Visit: Payer: Self-pay | Admitting: Cardiovascular Disease

## 2024-09-07 ENCOUNTER — Other Ambulatory Visit: Payer: Self-pay

## 2024-09-07 MED ORDER — ROSUVASTATIN CALCIUM 20 MG PO TABS
20.0000 mg | ORAL_TABLET | Freq: Every day | ORAL | 3 refills | Status: AC
  Filled 2024-09-07: qty 90, 90d supply, fill #0
  Filled 2024-12-07: qty 90, 90d supply, fill #1

## 2024-09-09 ENCOUNTER — Ambulatory Visit: Admitting: Physician Assistant

## 2024-09-10 ENCOUNTER — Other Ambulatory Visit: Payer: Self-pay

## 2024-09-11 ENCOUNTER — Other Ambulatory Visit: Payer: Self-pay

## 2024-09-11 DIAGNOSIS — H53483 Generalized contraction of visual field, bilateral: Secondary | ICD-10-CM | POA: Diagnosis not present

## 2024-09-11 DIAGNOSIS — H02834 Dermatochalasis of left upper eyelid: Secondary | ICD-10-CM | POA: Diagnosis not present

## 2024-09-11 DIAGNOSIS — H02421 Myogenic ptosis of right eyelid: Secondary | ICD-10-CM | POA: Diagnosis not present

## 2024-09-11 DIAGNOSIS — H5702 Anisocoria: Secondary | ICD-10-CM | POA: Diagnosis not present

## 2024-09-11 DIAGNOSIS — H0279 Other degenerative disorders of eyelid and periocular area: Secondary | ICD-10-CM | POA: Diagnosis not present

## 2024-09-11 DIAGNOSIS — H02423 Myogenic ptosis of bilateral eyelids: Secondary | ICD-10-CM | POA: Diagnosis not present

## 2024-09-11 DIAGNOSIS — H02422 Myogenic ptosis of left eyelid: Secondary | ICD-10-CM | POA: Diagnosis not present

## 2024-09-11 DIAGNOSIS — H57813 Brow ptosis, bilateral: Secondary | ICD-10-CM | POA: Diagnosis not present

## 2024-09-11 DIAGNOSIS — H02831 Dermatochalasis of right upper eyelid: Secondary | ICD-10-CM | POA: Diagnosis not present

## 2024-09-11 MED ORDER — DOXYCYCLINE HYCLATE 100 MG PO CAPS
100.0000 mg | ORAL_CAPSULE | Freq: Two times a day (BID) | ORAL | 0 refills | Status: DC
  Filled 2024-09-11: qty 14, 7d supply, fill #0

## 2024-09-18 ENCOUNTER — Inpatient Hospital Stay (HOSPITAL_BASED_OUTPATIENT_CLINIC_OR_DEPARTMENT_OTHER): Payer: Commercial Managed Care - PPO | Admitting: Nurse Practitioner

## 2024-09-18 ENCOUNTER — Other Ambulatory Visit: Payer: Self-pay

## 2024-09-18 ENCOUNTER — Inpatient Hospital Stay: Payer: Commercial Managed Care - PPO | Attending: Nurse Practitioner

## 2024-09-18 VITALS — BP 134/74 | HR 66 | Resp 18 | Wt 179.0 lb

## 2024-09-18 DIAGNOSIS — D649 Anemia, unspecified: Secondary | ICD-10-CM | POA: Diagnosis not present

## 2024-09-18 DIAGNOSIS — D638 Anemia in other chronic diseases classified elsewhere: Secondary | ICD-10-CM

## 2024-09-18 DIAGNOSIS — Z862 Personal history of diseases of the blood and blood-forming organs and certain disorders involving the immune mechanism: Secondary | ICD-10-CM

## 2024-09-18 DIAGNOSIS — D509 Iron deficiency anemia, unspecified: Secondary | ICD-10-CM

## 2024-09-18 LAB — CBC WITH DIFFERENTIAL (CANCER CENTER ONLY)
Abs Immature Granulocytes: 0.01 K/uL (ref 0.00–0.07)
Basophils Absolute: 0 K/uL (ref 0.0–0.1)
Basophils Relative: 1 %
Eosinophils Absolute: 0.1 K/uL (ref 0.0–0.5)
Eosinophils Relative: 2 %
HCT: 36 % (ref 36.0–46.0)
Hemoglobin: 12.3 g/dL (ref 12.0–15.0)
Immature Granulocytes: 0 %
Lymphocytes Relative: 20 %
Lymphs Abs: 1.1 K/uL (ref 0.7–4.0)
MCH: 30.1 pg (ref 26.0–34.0)
MCHC: 34.2 g/dL (ref 30.0–36.0)
MCV: 88 fL (ref 80.0–100.0)
Monocytes Absolute: 0.5 K/uL (ref 0.1–1.0)
Monocytes Relative: 9 %
Neutro Abs: 3.7 K/uL (ref 1.7–7.7)
Neutrophils Relative %: 68 %
Platelet Count: 174 K/uL (ref 150–400)
RBC: 4.09 MIL/uL (ref 3.87–5.11)
RDW: 12.6 % (ref 11.5–15.5)
WBC Count: 5.5 K/uL (ref 4.0–10.5)
nRBC: 0 % (ref 0.0–0.2)

## 2024-09-18 LAB — FERRITIN: Ferritin: 36 ng/mL (ref 11–307)

## 2024-09-18 LAB — VITAMIN B12: Vitamin B-12: 3052 pg/mL — ABNORMAL HIGH (ref 180–914)

## 2024-09-18 LAB — IRON AND TIBC
Iron: 59 ug/dL (ref 28–170)
Saturation Ratios: 17 % (ref 10.4–31.8)
TIBC: 343 ug/dL (ref 250–450)
UIBC: 284 ug/dL

## 2024-09-18 LAB — FOLATE: Folate: >20 ng/mL (ref >5.9)

## 2024-09-18 NOTE — Progress Notes (Signed)
 Hematology/Oncology Consult Note [facility]  Telephone:([phone] [phone]  Patient Care Team: Wendee Lynwood HERO, NP as PCP - General (Nurse Practitioner) Wonda Sharper, MD as PCP - Cardiology (Cardiology) Hosie Rush, MD Onita Duos, MD as Consulting Physician (Neurology) Onita Duos, MD as Referring Physician (Neurology) Melanee Annah BROCKS, MD as Consulting Physician (Hematology and Oncology)   Name of the patient: [name]  [id number]  1966/03/12   Date of visit: 09/18/24  Diagnosis- normocytic anemia likely secondary to anemia of chronic disease  Chief complaint/ Reason for visit- re-establish follow-up for anemia  Heme/Onc history: Patient is a 58 year old female with past medical history significant for obstructive sleep apnea, type 1 diabetes, hypertension, rheumatoid arthritis among other medical problems.  She has been referred for anemia.  Her most recent CBC from 01/11/2022 showed white count of 4.6, H&H of 11.9/34.8 with an MCV of 88 and a platelet count of 222.  Iron  studies showed a low iron  saturation of 16% and a ferritin of 48 with a TIBC that was normal at 361.  TSH normal.  Patient has started taking oral iron  about 2 to 3 weeks ago.  She denies any bleeding in her stool or urine.  She has had EGD and colonoscopy by Dr. Therisa in March 2021.  EGD was normal colonoscopy was also normal and a repeat colonoscopy was recommended in 5 years .   Previous anemia workup in May 2023 did not reveal any specific cause of anemia.  No evidence of myeloma.  No evidence of hemolysis and iron  and B12 levels were unremarkable.  Interval history-patient is a 58 year old female with above history of anemia who returns to clinic for routine follow-up.  She continues follow-up with rheumatology for her rheumatoid arthritis and is on leflunomide . On zepbound  to lose weight. Denies recent hospitalizations.   ECOG PS- 1 Pain scale- 0  Review of systems-  Review of Systems  Constitutional:  Positive for malaise/fatigue. Negative for chills, fever and weight loss.  HENT:  Negative for congestion, ear discharge and nosebleeds.   Eyes:  Negative for blurred vision.  Respiratory:  Negative for cough, hemoptysis, sputum production, shortness of breath and wheezing.   Cardiovascular:  Negative for chest pain, palpitations, orthopnea and claudication.  Gastrointestinal:  Negative for abdominal pain, blood in stool, constipation, diarrhea, heartburn, melena, nausea and vomiting.  Genitourinary:  Negative for dysuria, flank pain, frequency, hematuria and urgency.  Musculoskeletal:  Negative for back pain, joint pain and myalgias.  Skin:  Negative for rash.  Neurological:  Negative for dizziness, tingling, focal weakness, seizures, weakness and headaches.  Endo/Heme/Allergies:  Does not bruise/bleed easily.  Psychiatric/Behavioral:  Negative for depression and suicidal ideas. The patient does not have insomnia.     Allergies  Allergen Reactions   Actemra  [Tocilizumab ]    Ramipril Swelling, Rash and Other (See Comments)   Shellfish-Derived Products Swelling    Shrimp    Atorvastatin Rash    Elevated LFT's   Compazine  [Prochlorperazine Edisylate] Other (See Comments)    Neurological reaction   Emgality  [Galcanezumab -Gnlm] Hives and Swelling   Etanercept Swelling and Rash   Infliximab Rash   Iohexol      Iv contrast dye -rash all over   Orencia [Abatacept] Rash   Prochlorperazine Edisylate     unknown   Tofacitinib Rash and Other (See Comments)    Severe abdominal pain    Trokendi  Xr [Topiramate  Er]     Brain fog, memory issues, word finding issues  Shrimp Extract Other (See Comments)   Topiramate  Other (See Comments)   Tramadol      Nausea    Amiodarone Nausea Only   Rituximab Rash    Causes a rash   Past Medical History:  Diagnosis Date   ACUT MI ANTEROLAT WALL SUBSQT EPIS CARE    Acute maxillary sinusitis    ALLERGIC RHINITIS,  SEASONAL    Allergy    Anemia    Arthritis    ARTHRITIS, RHEUMATOID    shoulders and hands Enbrel>leg swelling Dr. Dolphus   Atrial fibrillation [facility])    a. after CABG.   Back pain    Blood transfusion without reported diagnosis    Bruxism (teeth grinding)    CAD, ARTERY BYPASS GRAFT    a. DES to RCA in 2010 then LAD occlusion s/p CABG 3 06/07/2009 with LIMA to LAD, reverse SVG to D1, reverse SVG to distal RCA. b. Cath 05/08/2016 slightly hypodense region in the intermediate branch, however she had excellent flow, FFR was normal. Vein graft to PDA and the posterolateral branch is patent, patent LIMA to LAD, occluded SVG to diagonal.   CARPAL TUNNEL SYNDROME, BILATERAL    Cataract    CHOLELITHIASIS    Clotting disorder (HCC)    Contrast media allergy    DERMATITIS, ALLERGIC    DIABETES MELLITUS, TYPE I    on insulin  pump dx'ed age 32 y.o    DIABETIC  RETINOPATHY    GERD (gastroesophageal reflux disease)    Graves' disease    Heart attack (HCC)    Hiatal hernia    HYPERLIPIDEMIA-MIXED    Hypertension    HYPERTHYROIDISM    Dr. Hosie Balder   IBS (irritable bowel syndrome)    IDA (iron  deficiency anemia)    Infertility, female    Insulin  pump in place    Lymphadenopathy of head and neck    MIGRAINE W/O AURA W/INTRACT W/STATUS MIGRAINOSUS 02/19/2008   PONV (postoperative nausea and vomiting)    Psoriasis    Rheumatoid arthritis (HCC)    SINUS TACHYCARDIA 11/08/2010   Sleep apnea    not using machine yet    Stomach ulcer    SVT (supraventricular tachycardia) (HCC)    after s/p CABG   Tendonitis    TRIGGER FINGER    all fingers b/l hands    URI      Past Surgical History:  Procedure Laterality Date   ABDOMINAL HYSTERECTOMY     endometriomas b/l; total ? cervix removed; no h/o abnormal pap   Caesarean section     CARDIAC CATHETERIZATION N/A 05/08/2016   Procedure: Left Heart Cath and Cors/Grafts Angiography;  Surgeon: Lonni JONETTA Cash, MD;  Location: [facility]  INVASIVE CV LAB;  Service: Cardiovascular;  Laterality: N/A;   CARPAL TUNNEL RELEASE     b/l    CARPAL TUNNEL RELEASE Left 06/06/2021   Procedure: CARPAL TUNNEL RELEASE;  Surgeon: Murrell Kuba, MD;  Location: [facility];  Service: Orthopedics;  Laterality: Left;  AXILLARY BLOCK   CATARACT EXTRACTION, BILATERAL     CESAREAN SECTION     CHOLECYSTECTOMY     COLONOSCOPY WITH PROPOFOL  N/A 03/25/2020   Procedure: COLONOSCOPY WITH PROPOFOL ;  Surgeon: Therisa Bi, MD;  Location: [facility] ENDOSCOPY;  Service: Gastroenterology;  Laterality: N/A;   CORONARY ARTERY BYPASS GRAFT     ESOPHAGOGASTRODUODENOSCOPY (EGD) WITH PROPOFOL  N/A 03/25/2020   Procedure: ESOPHAGOGASTRODUODENOSCOPY (EGD) WITH PROPOFOL ;  Surgeon: Therisa Bi, MD;  Location: [facility] ENDOSCOPY;  Service: Gastroenterology;  Laterality: N/A;  EYE SURGERY     laser x 2 for retinopathy    LEFT HEART CATH AND CORS/GRAFTS ANGIOGRAPHY N/A 02/22/2020   Procedure: LEFT HEART CATH AND CORS/GRAFTS ANGIOGRAPHY;  Surgeon: Verlin Lonni BIRCH, MD;  Location: [facility] INVASIVE CV LAB;  Service: Cardiovascular;  Laterality: N/A;   TRIGGER FINGER RELEASE     b/l fingers all    ULNAR NERVE TRANSPOSITION Left 06/06/2021   Procedure: ULNAR NERVE DECOMPRESSION;  Surgeon: Murrell Kuba, MD;  Location: [facility];  Service: Orthopedics;  Laterality: Left;  AXILLARY BLOCK   VITRECTOMY     b/l     Social History   Socioeconomic History   Marital status: Divorced    Spouse name: Not on file   Number of children: Not on file   Years of education: Not on file   Highest education level: Not on file  Occupational History   Not on file  Tobacco Use   Smoking status: Never    Passive exposure: Never   Smokeless tobacco: Never  Vaping Use   Vaping status: Never Used  Substance and Sexual Activity   Alcohol use: Yes    Comment: rarely 1 every 6 months   Drug use: No   Sexual activity: Not Currently    Partners: Male    Birth  control/protection: None  Other Topics Concern   Not on file  Social History Narrative   Divorced. Has 2 kids(fraternal twins) daughters age 57. Works at  [employer], Never smoked, denies ETOH, no drugs. Drinks diet coke. No exercise.       DPR daughters Eleanor and Demecia [place] twins          Social Drivers of Health   Financial Resource Strain: Not on file  Food Insecurity: No Food Insecurity (05/22/2022)   [facility]   Hunger Vital Sign    Within the past 12 months, you worried that your food would run out before you got the money to buy more.: Never true    Within the past 12 months, the food you bought just didn't last and you didn't have money to get more.: Never true  Transportation Needs: Not on file  Physical Activity: Not on file  Stress: Not on file  Social Connections: Unknown (05/09/2022)   [facility]   Social Network    Social Network: Not on file  Intimate Partner Violence: Unknown (04/03/2022)   Received from [facility]   HITS    Physically Hurt: Not on file    Insult or Talk Down To: Not on file    Threaten Physical Harm: Not on file    Scream or Curse: Not on file    Family History  Problem Relation Age of Onset   Depression Mother    Anxiety disorder Mother    Hypertension Mother    Hyperlipidemia Mother    Thyroid  disease Mother    Bipolar disorder Mother    Sleep apnea Mother    Eating disorder Mother    Colon polyps Father    Diabetes Father        2   Hypertension Father    Parkinson's disease Father    Hyperlipidemia Father    Depression Father    Sleep apnea Father    Obesity Father    Stroke Paternal Grandmother    Heart disease Other    Hypertension Other    Hyperlipidemia Other    Depression Other    Migraines Other  Heart attack Neg Hx    Colon cancer Neg Hx    Stomach cancer Neg Hx    Esophageal cancer Neg Hx      Current Outpatient Medications:    acetaminophen -codeine  (TYLENOL  #3)  300-30 MG tablet, Take 1 tablet by mouth 2 (two) times daily as needed for moderate pain (pain score 4-6)., Disp: 60 tablet, Rfl: 3   albuterol  (VENTOLIN  HFA) 108 (90 Base) MCG/ACT inhaler, Inhale 2 puffs into the lungs every 4 (four) hours as needed., Disp: , Rfl:    Albuterol -Budesonide  (AIRSUPRA ) 90-80 MCG/ACT AERO, Inhale 2 puffs into the lungs every 6 (six) hours., Disp: 10.7 g, Rfl: 3   Ascorbic Acid 500 MG CAPS, Take by mouth., Disp: , Rfl:    aspirin  EC 81 MG tablet, , Disp: , Rfl:    Blood Glucose Monitoring Suppl (FREESTYLE FREEDOM LITE) w/Device KIT, Use as directed by physician to check blood sugar, Disp: 1 kit, Rfl: 0   CHELATED MAGNESIUM PO, Take 250 mg by mouth daily., Disp: , Rfl:    Cholecalciferol (VITAMIN D3) 125 MCG (5000 UT) CAPS, Decrease to 1 tab every other day, Disp: , Rfl:    clopidogrel  (PLAVIX ) 75 MG tablet, TAKE 1 TABLET BY MOUTH ONCE DAILY, Disp: 90 tablet, Rfl: 2   Cyanocobalamin  (VITAMIN B-12 PO), Take by mouth., Disp: , Rfl:    cyclobenzaprine  (FLEXERIL ) 10 MG tablet, Take 1 tablet (10 mg total) by mouth at bedtime as needed for muscle spasms., Disp: 30 tablet, Rfl: 2   doxycycline  (VIBRAMYCIN ) 100 MG capsule, Take 1 capsule (100 mg total) by mouth 2 (two) times daily for 7 days (Patient not taking: Reported on 09/18/2024), Disp: 14 capsule, Rfl: 0   EPINEPHrine  0.3 mg/0.3 mL IJ SOAJ injection, Inject 0.3 mg into the muscle as needed for anaphylaxis., Disp: 2 each, Rfl: 2   fluticasone  (FLONASE ) 50 MCG/ACT nasal spray, Place 2 sprays into both nostrils daily., Disp: 16 g, Rfl: 0   fluticasone -salmeterol (ADVAIR ) 100-50 MCG/ACT AEPB, Inhale 1 puff into the lungs 2 (two) times daily., Disp: 60 each, Rfl: 1   Glucagon  (GVOKE HYPOPEN  2-PACK) 0.5 MG/0.1ML SOAJ, Inject 0.5 mg into the skin as needed (Hypoglycemic episode)., Disp: 0.2 mL, Rfl: 5   glucose blood (FREESTYLE LITE) test strip, Use to check blood sugar 3 time(s) daily, Disp: 100 each, Rfl: 3   glucose blood test  strip, , Disp: , Rfl:    guaiFENesin -codeine  100-10 MG/5ML syrup, Take 5 mLs by mouth at bedtime. (Patient not taking: Reported on 09/18/2024), Disp: 118 mL, Rfl: 0   insulin  detemir (LEVEMIR ) 100 UNIT/ML injection, Inject 36 units into the skin once daily in the event of insulin  pump failure., Disp: 10 mL, Rfl: 0   insulin  lispro (HUMALOG ) 100 UNIT/ML injection, Inject 0.9-1.2 mLs (90-120 Units total) into the skin daily., Disp: 60 mL, Rfl: 3   Insulin  Syringe-Needle U-100 (BD INSULIN  SYRINGE U/F) 31G X 5/16 0.3 ML MISC, use with insulin  injections 3 times daily in the event of insulin  pump failure., Disp: 100 each, Rfl: 12   ipratropium (ATROVENT ) 0.03 % nasal spray, Place 2 sprays into both nostrils every 12 (twelve) hours., Disp: 30 mL, Rfl: 0   isosorbide  mononitrate (IMDUR ) 60 MG 24 hr tablet, Take 1 tablet (60 mg total) by mouth daily., Disp: 90 tablet, Rfl: 1   Krill Oil 500 MG CAPS, Take 500 mg by mouth daily., Disp: , Rfl:    Lancets (FREESTYLE) lancets, Use to check blood sugar 3 time(s)  daily., Disp: 100 each, Rfl: 99   leflunomide  (ARAVA ) 20 MG tablet, Take 1 tablet (20 mg total) by mouth daily., Disp: 90 tablet, Rfl: 0   linaclotide  (LINZESS ) 290 MCG CAPS capsule, Take 1 capsule (290 mcg total) by mouth daily before breakfast., Disp: 90 capsule, Rfl: 1   losartan  (COZAAR ) 100 MG tablet, Take 1 tablet (100 mg total) by mouth daily., Disp: 90 tablet, Rfl: 1   metoprolol  tartrate (LOPRESSOR ) 50 MG tablet, Take 0.5 tablets (25 mg total) by mouth 2 (two) times daily., Disp: 30 tablet, Rfl: 0   montelukast  (SINGULAIR ) 10 MG tablet, Take 1 tablet (10 mg total) by mouth at bedtime., Disp: 90 tablet, Rfl: 3   Multiple Vitamin (MULTIVITAMIN) capsule, Take 1 capsule by mouth daily., Disp: , Rfl:    neomycin -polymyxin b-dexamethasone  (MAXITROL ) 3.5-10000-0.1 OINT, APPLY A SMALL AMOUNT ONTO SUTURES OR OPERATIVE SITE TWICE A DAY FOR THREE DAYS THEN STOP, Disp: 3.5 g, Rfl: 0   neomycin -polymyxin  b-dexamethasone  (MAXITROL ) 3.5-10000-0.1 SUSP, Place 1 drop into both eyes 4 (four) times daily., Disp: 5 mL, Rfl: 0   nitrofurantoin , macrocrystal-monohydrate, (MACROBID ) 100 MG capsule, Take 1 capsule (100 mg total) by mouth 2 (two) times daily., Disp: 10 capsule, Rfl: 0   ondansetron  (ZOFRAN ) 4 MG tablet, Take 1 tablet (4 mg total) by mouth every 8 (eight) hours as needed for nausea or vomiting. (Patient taking differently: Take 4 mg by mouth as needed for nausea or vomiting.), Disp: 90 tablet, Rfl: 5   pantoprazole  (PROTONIX ) 40 MG tablet, Take 1 tablet (40 mg total) by mouth 2 (two) times daily., Disp: 180 tablet, Rfl: 3   potassium chloride  SA (KLOR-CON  M20) 20 MEQ tablet, Take 1 tablet (20 mEq total) by mouth daily., Disp: 90 tablet, Rfl: 3   rosuvastatin  (CRESTOR ) 20 MG tablet, Take 1 tablet (20 mg total) by mouth daily., Disp: 90 tablet, Rfl: 3   torsemide  (DEMADEX ) 20 MG tablet, Take 1 tablet (20 mg total) by mouth daily., Disp: 90 tablet, Rfl: 1   Turmeric (QC TUMERIC COMPLEX PO), Take 1,000 mg by mouth., Disp: , Rfl:    Upadacitinib  ER (RINVOQ ) 15 MG TB24, Take 1 tablet (15 mg) by mouth daily., Disp: 90 tablet, Rfl: 0  Current Facility-Administered Medications:    lidocaine  (XYLOCAINE ) 1 % (with pres) injection 6 mL, 6 mL, Other, Once, Lovorn, Megan, MD   lidocaine  (XYLOCAINE ) 1 % (with pres) injection 6 mL, 6 mL, Other, Once,    lidocaine  (XYLOCAINE ) 1 % (with pres) injection 6 mL, 6 mL, Other, Once,   Physical exam:  Vitals:   09/18/24 1519 09/18/24 1520  BP:  134/74  Pulse:  66  Resp:  18  SpO2:  100%  Weight: 179 lb (81.2 kg)    Physical Exam Vitals reviewed.  Constitutional:      General: She is not in acute distress.    Appearance: She is well-developed. She is not ill-appearing.  HENT:     Head: Atraumatic.  Eyes:     General: No scleral icterus. Cardiovascular:     Rate and Rhythm: Normal rate and regular rhythm.  Pulmonary:     Effort: Pulmonary effort is  normal.     Breath sounds: Normal breath sounds.  Abdominal:     General: There is no distension.     Palpations: Abdomen is soft.     Tenderness: There is no abdominal tenderness.  Musculoskeletal:        General: No deformity.  Skin:  General: Skin is warm and dry.     Coloration: Skin is not pale.  Neurological:     Mental Status: She is alert and oriented to person, place, and time.  Psychiatric:        Mood and Affect: Mood normal.        Behavior: Behavior normal.        Latest Ref Rng & Units 09/18/2024    3:02 PM  CBC  WBC 4.0 - 10.5 K/uL 5.5   Hemoglobin 12.0 - 15.0 g/dL 87.6   Hematocrit 63.9 - 46.0 % 36.0   Platelets 150 - 400 K/uL 174    Iron /TIBC/Ferritin/ %Sat    Component Value Date/Time   IRON  59 09/18/2024 1502   TIBC 343 09/18/2024 1502   FERRITIN 36 09/18/2024 1502   IRONPCTSAT 17 09/18/2024 1502   Last vitamin B12 and Folate Lab Results  Component Value Date   VITAMINB12 3,052 (H) 09/18/2024   FOLATE >20.0 09/18/2024     Assessment and plan- Patient is a 58 y.o. female who returns to clinic for follow up of:   Anemia of Chronic Disease-baseline hemoglobin runs between 11-12.  Her hemoglobin had dropped to 10.5 and she was referred to hematology/Dr. Melanee.  On recheck, it was 11.1.  Mild leukopenia with normal platelet count.  Ferritin was 58 and iron  saturation 20% with a normal serum iron  and TIBC.  B12 and folate levels were also normal.  Etiology thought to be secondary to chronic disease.  Today, her hemoglobin has improved to 12.3.  Ferritin is 36 and iron  saturation is 17% with normal TIBC and normal serum iron .  Her B12 and folate levels are elevated.  Her hemoglobin has improved and now normalized.  I think this is supportive of anemia of chronic disease.  She has labs with her PCP and internal medicine doctor.  For now, we will not schedule additional follow-up but we can see her back in the future if her counts drop or any concerning  findings. Leukopenia-mild.  Thought to be secondary to her rheumatoid arthritis.  Disposition: As needed-LA  Visit Diagnosis 1. History of anemia    Tinnie Dawn, DNP, AGNP-C, [facility] [phone] (clinic) 09/18/2024  CC: Lynwood Crandall, NP

## 2024-09-20 ENCOUNTER — Other Ambulatory Visit: Payer: Self-pay | Admitting: Cardiovascular Disease

## 2024-09-21 ENCOUNTER — Ambulatory Visit: Admitting: Physician Assistant

## 2024-09-21 ENCOUNTER — Other Ambulatory Visit: Payer: Self-pay

## 2024-09-21 ENCOUNTER — Other Ambulatory Visit: Payer: Self-pay | Admitting: Physician Assistant

## 2024-09-21 MED ORDER — LEFLUNOMIDE 20 MG PO TABS
20.0000 mg | ORAL_TABLET | Freq: Every day | ORAL | 0 refills | Status: DC
  Filled 2024-10-01: qty 90, 90d supply, fill #0

## 2024-09-21 MED ORDER — ISOSORBIDE MONONITRATE ER 60 MG PO TB24
60.0000 mg | ORAL_TABLET | Freq: Every day | ORAL | 1 refills | Status: AC
  Filled 2024-10-01: qty 90, 90d supply, fill #0
  Filled 2024-12-26: qty 90, 90d supply, fill #1

## 2024-09-21 NOTE — Telephone Encounter (Signed)
 Last Fill: 06/24/2024  Labs: 09/18/2024 CBC WNL  07/01/2024 CMP CMP is normal   Next Visit: 10/14/2024  Last Visit: 05/11/2024  DX: Rheumatoid arthritis of multiple sites with negative rheumatoid factor (HCC)   Current Dose per office note 05/11/2024: Arava  20 mg 1 tablet by mouth daily.   Okay to refill Arava  ?

## 2024-09-23 ENCOUNTER — Other Ambulatory Visit (HOSPITAL_COMMUNITY): Payer: Self-pay

## 2024-09-24 ENCOUNTER — Other Ambulatory Visit: Payer: Self-pay

## 2024-09-24 MED ORDER — METHYLPREDNISOLONE 4 MG PO TBPK
ORAL_TABLET | ORAL | 0 refills | Status: DC
  Filled 2024-09-24: qty 21, 6d supply, fill #0

## 2024-09-28 ENCOUNTER — Other Ambulatory Visit: Payer: Self-pay | Admitting: Cardiovascular Disease

## 2024-09-28 ENCOUNTER — Other Ambulatory Visit: Payer: Self-pay

## 2024-09-28 ENCOUNTER — Encounter (INDEPENDENT_AMBULATORY_CARE_PROVIDER_SITE_OTHER): Payer: Self-pay

## 2024-09-29 ENCOUNTER — Ambulatory Visit: Admitting: Podiatry

## 2024-09-29 ENCOUNTER — Other Ambulatory Visit: Payer: Self-pay

## 2024-09-29 ENCOUNTER — Other Ambulatory Visit: Payer: Self-pay | Admitting: Pharmacy Technician

## 2024-09-29 MED ORDER — METOPROLOL TARTRATE 50 MG PO TABS
25.0000 mg | ORAL_TABLET | Freq: Two times a day (BID) | ORAL | 3 refills | Status: AC
  Filled 2024-10-01: qty 45, 45d supply, fill #0
  Filled 2024-11-13: qty 90, 90d supply, fill #1

## 2024-09-29 NOTE — Progress Notes (Signed)
 Specialty Pharmacy Refill Coordination Note  [name] is a 58 y.o. female contacted today regarding refills of specialty medication(s) Upadacitinib  (Rinvoq )   Patient requested (Patient-Rptd) Delivery   Delivery date: 10/07/24 Verified address: (Patient-Rptd) [address] [place] [address]   Medication will be filled on 10/06/24.

## 2024-10-01 ENCOUNTER — Other Ambulatory Visit: Payer: Self-pay

## 2024-10-01 MED FILL — Clopidogrel Bisulfate Tab 75 MG (Base Equiv): ORAL | 90 days supply | Qty: 90 | Fill #1 | Status: AC

## 2024-10-01 NOTE — Progress Notes (Signed)
 Office Visit Note  Patient: [name]             Date of Birth: 06/29/1966           MRN: [id number]             PCP: Wendee Lynwood HERO, NP Referring: Wendee Lynwood HERO, NP Visit Date: 10/14/2024 Occupation: [facility] ENDOSCOPY  Subjective:  Medication management  History of Present Illness: [name] is a 58 y.o. female with seropositive rheumatoid arthritis, osteoarthritis and degenerative disc disease.  She returns today after her last visit in May 2025.  She states she underwent bilateral upper eyelid surgery on September 27, 2024.  She stopped Rinvoq  and Areva 1 week prior to the surgery and resumed 1 week after the surgery.  She did not experience flare of rheumatoid arthritis.  She is recovering well from the surgery.  She continues to have some discomfort in her hands.    Activities of Daily Living:  Patient reports morning stiffness for 1 hour.   Patient Reports nocturnal pain.  Difficulty dressing/grooming: Denies Difficulty climbing stairs: Reports Difficulty getting out of chair: Reports Difficulty using hands for taps, buttons, cutlery, and/or writing: Reports  Review of Systems  Constitutional:  Positive for fatigue.  HENT:  Negative for mouth sores and mouth dryness.   Eyes:  Positive for dryness.  Respiratory:  Negative for shortness of breath.   Cardiovascular:  Negative for chest pain and palpitations.  Gastrointestinal:  Positive for constipation and diarrhea. Negative for blood in stool.  Endocrine: Negative for increased urination.  Genitourinary:  Negative for involuntary urination.  Musculoskeletal:  Positive for joint pain, joint pain, joint swelling, myalgias, muscle weakness, morning stiffness, muscle tenderness and myalgias. Negative for gait problem.  Skin:  Positive for color change. Negative for rash, hair loss and sensitivity to sunlight.  Allergic/Immunologic: Positive for susceptible to infections.  Neurological:  Negative for dizziness and  headaches.  Hematological:  Negative for swollen glands.  Psychiatric/Behavioral:  Positive for sleep disturbance. Negative for depressed mood. The patient is not nervous/anxious.     PMFS History:  Patient Active Problem List   Diagnosis Date Noted   Bronchospasm 04/21/2024   Antibiotic-induced yeast infection 02/13/2024   Acute bronchitis 12/23/2023   Abnormal CBC 12/12/2023   H/O Graves' disease 12/11/2023   Mixed diabetic hyperlipidemia associated with type 1 diabetes mellitus (HCC) 12/11/2023   Dysfunction of both eustachian tubes 11/01/2023   Lower respiratory infection 08/30/2023   Lower extremity edema 06/12/2023   Myofascial pain dysfunction syndrome 03/22/2023   Left foot pain 12/05/2022   Urinary urgency 10/23/2022   Pyelonephritis 10/23/2022   Premature atrial contractions 05/15/2022   PVC's (premature ventricular contractions) 05/15/2022   Low hemoglobin 01/16/2022   Otalgia of right ear 01/09/2022   Sore throat 01/09/2022   OSA on CPAP 08/02/2021   Abnormal MRI, lumbar spine 03/20/2021   Abnormal MRI, thoracic spine 03/20/2021   Chronic mid back pain 03/17/2021   Chronic midline low back pain with left-sided sciatica 03/17/2021   Type 1 diabetes mellitus with diabetic polyneuropathy (HCC) 02/17/2021   Severe obstructive sleep apnea-hypopnea syndrome 02/17/2021   Chronic diastolic CHF (congestive heart failure) (HCC) 12/19/2020   Chronic back pain 12/19/2020   Chronic abdominal pain 12/19/2020   Acute diastolic heart failure (HCC) 12/09/2020   Diabetic retinopathy associated with type 1 diabetes mellitus (HCC) 09/27/2020   Diabetic retinopathy (HCC) 09/27/2020   Graves' disease 09/27/2020   Chronic coronary artery disease  09/27/2020   Hypertension associated with diabetes (HCC) 09/14/2020   Class 1 obesity 09/14/2020   Preventative health care 09/14/2020   Aortic atherosclerosis 09/14/2020   Snoring 05/12/2020   Lung nodule 03/10/2020   Colitis 03/10/2020    Type 1 diabetes mellitus with proliferative retinopathy (HCC) 12/10/2019   Abnormal thyroid  function test 12/10/2019   Bruxism (teeth grinding)    Migraine 10/07/2019   Contusion of left hip 07/25/2019   H/O multiple allergies 03/10/2019   Hx of anaphylaxis 03/10/2019   Cough 06/26/2018   PND (post-nasal drip) 06/26/2018   Lumbar strain, initial encounter 04/10/2018   Thoracic myofascial strain 04/10/2018   Vitamin D  deficiency 07/04/2017   B12 deficiency 07/04/2017   Abnormal CT scan 06/19/2017   Gastroesophageal reflux disease 06/19/2017   Antiplatelet or antithrombotic long-term use 06/19/2017   Nasal congestion 03/15/2017   Graves disease 02/01/2017   Sleep difficulties 02/01/2017   No energy 02/01/2017   Osteopenia 02/01/2017   Bilateral hand pain 07/16/2016   Acquired trigger finger 07/16/2016   Hypertension, essential 05/08/2016   Right foot pain 08/30/2014   Shoulder pain, right 08/30/2014   Chronic pain syndrome 01/15/2014   Multiple joint pain 01/15/2014   Lesion of lower eyelid 04/21/2013   Generalized constriction of visual field 03/31/2013   Neoplasm of uncertain behavior of skin of eyelid 03/31/2013   Posterior capsular opacification 03/31/2013   Cyst of left lower eyelid 03/30/2013   Pseudophakia of both eyes 03/30/2013   Prurigo nodularis 02/05/2013   Stasis dermatitis 02/05/2013   Psoriasis 01/21/2013   Trochanteric bursitis 11/10/2012   Achilles tendinitis 07/11/2012   Epiretinal membrane 06/17/2012   Status post cataract extraction 04/30/2012   Pes equinus, acquired 04/23/2012   Tendinitis of ankle 04/23/2012   Proliferative diabetic retinopathy of both eyes (HCC) 01/11/2012   Ongoing use of possibly toxic medication 12/05/2011   Hyperthyroidism 11/09/2010   SINUS TACHYCARDIA 11/08/2010   Shortness of breath 11/08/2010   Acute non-recurrent maxillary sinusitis 10/16/2010   DERMATITIS, ALLERGIC 07/20/2010   EDEMA 07/12/2010   Dizziness  11/14/2009   ATRIAL FIBRILLATION 07/20/2009   Hx of CABG 06/07/2009   ANGINA, STABLE/EXERTIONAL 06/02/2009   Dyslipidemia, goal LDL below 70 04/26/2009   ACUT MI ANTEROLAT WALL SUBSQT EPIS CARE 04/26/2009   Chest pain 04/26/2009   DIABETIC  RETINOPATHY 04/25/2009   CARPAL TUNNEL SYNDROME, BILATERAL 04/25/2009   TRIGGER FINGER 04/25/2009   MIGRAINE W/O AURA W/INTRACT W/STATUS MIGRAINOSUS 02/19/2008   URI with cough and congestion 01/24/2007   Type 1 diabetes mellitus with complications (HCC) 10/16/2006   ALLERGIC RHINITIS, SEASONAL 10/16/2006   CHOLELITHIASIS 10/16/2006   Rheumatoid arthritis (HCC) 10/16/2006    Past Medical History:  Diagnosis Date   ACUT MI ANTEROLAT WALL SUBSQT EPIS CARE    Acute maxillary sinusitis    ALLERGIC RHINITIS, SEASONAL    Allergy    Anemia    Arthritis    ARTHRITIS, RHEUMATOID    shoulders and hands Enbrel>leg swelling Dr. Dolphus   Atrial fibrillation [facility])    a. after CABG.   Back pain    Blood transfusion without reported diagnosis    Bruxism (teeth grinding)    CAD, ARTERY BYPASS GRAFT    a. DES to RCA in 2010 then LAD occlusion s/p CABG 3 06/07/2009 with LIMA to LAD, reverse SVG to D1, reverse SVG to distal RCA. b. Cath 05/08/2016 slightly hypodense region in the intermediate branch, however she had excellent flow, FFR was normal. Vein graft to PDA  and the posterolateral branch is patent, patent LIMA to LAD, occluded SVG to diagonal.   CARPAL TUNNEL SYNDROME, BILATERAL    Cataract    CHOLELITHIASIS    Clotting disorder    Contrast media allergy    DERMATITIS, ALLERGIC    DIABETES MELLITUS, TYPE I    on insulin  pump dx'ed age 54 y.o    DIABETIC  RETINOPATHY    GERD (gastroesophageal reflux disease)    Graves' disease    Heart attack (HCC)    Hiatal hernia    HYPERLIPIDEMIA-MIXED    Hypertension    HYPERTHYROIDISM    Dr. Hosie Balder   IBS (irritable bowel syndrome)    IDA (iron  deficiency anemia)    Infertility, female    Insulin   pump in place    Lymphadenopathy of head and neck    MIGRAINE W/O AURA W/INTRACT W/STATUS MIGRAINOSUS 02/19/2008   PONV (postoperative nausea and vomiting)    Psoriasis    Rheumatoid arthritis (HCC)    SINUS TACHYCARDIA 11/08/2010   Sleep apnea    not using machine yet    Stomach ulcer    SVT (supraventricular tachycardia)    after s/p CABG   Tendonitis    TRIGGER FINGER    all fingers b/l hands    URI     Family History  Problem Relation Age of Onset   Depression Mother    Anxiety disorder Mother    Hypertension Mother    Hyperlipidemia Mother    Thyroid  disease Mother    Bipolar disorder Mother    Sleep apnea Mother    Eating disorder Mother    Colon polyps Father    Diabetes Father        2   Hypertension Father    Parkinson's disease Father    Hyperlipidemia Father    Depression Father    Sleep apnea Father    Obesity Father    Stroke Paternal Grandmother    Heart disease Other    Hypertension Other    Hyperlipidemia Other    Depression Other    Migraines Other    Heart attack Neg Hx    Colon cancer Neg Hx    Stomach cancer Neg Hx    Esophageal cancer Neg Hx    Past Surgical History:  Procedure Laterality Date   ABDOMINAL HYSTERECTOMY     endometriomas b/l; total ? cervix removed; no h/o abnormal pap   Caesarean section     CARDIAC CATHETERIZATION N/A 05/08/2016   Procedure: Left Heart Cath and Cors/Grafts Angiography;  Surgeon: Lonni JONETTA Cash, MD;  Location: [facility];  Service: Cardiovascular;  Laterality: N/A;   CARPAL TUNNEL RELEASE     b/l    CARPAL TUNNEL RELEASE Left 06/06/2021   Procedure: CARPAL TUNNEL RELEASE;  Surgeon: Murrell Kuba, MD;  Location: [facility];  Service: Orthopedics;  Laterality: Left;  AXILLARY BLOCK   CATARACT EXTRACTION, BILATERAL     CESAREAN SECTION     CHOLECYSTECTOMY     COLONOSCOPY WITH PROPOFOL  N/A 03/25/2020   Procedure: COLONOSCOPY WITH PROPOFOL ;  Surgeon: Therisa Bi, MD;  Location:  [facility];  Service: Gastroenterology;  Laterality: N/A;   CORONARY ARTERY BYPASS GRAFT     ESOPHAGOGASTRODUODENOSCOPY (EGD) WITH PROPOFOL  N/A 03/25/2020   Procedure: ESOPHAGOGASTRODUODENOSCOPY (EGD) WITH PROPOFOL ;  Surgeon: Therisa Bi, MD;  Location: [facility] ENDOSCOPY;  Service: Gastroenterology;  Laterality: N/A;   EYE SURGERY     laser x 2 for retinopathy  LEFT HEART CATH AND CORS/GRAFTS ANGIOGRAPHY N/A 02/22/2020   Procedure: LEFT HEART CATH AND CORS/GRAFTS ANGIOGRAPHY;  Surgeon: Verlin Lonni BIRCH, MD;  Location: [facility];  Service: Cardiovascular;  Laterality: N/A;   TRIGGER FINGER RELEASE     b/l fingers all    ULNAR NERVE TRANSPOSITION Left 06/06/2021   Procedure: ULNAR NERVE DECOMPRESSION;  Surgeon: Murrell Kuba, MD;  Location: [facility];  Service: Orthopedics;  Laterality: Left;  AXILLARY BLOCK   VITRECTOMY     b/l    Social History   Tobacco Use   Smoking status: Never    Passive exposure: Never   Smokeless tobacco: Never  Vaping Use   Vaping status: Never Used  Substance Use Topics   Alcohol use: Yes    Comment: rarely 1 every 6 months   Drug use: No   Social History   Social History Narrative   Divorced. Has 2 kids(fraternal twins) daughters age 45. Works at  [employer], Never smoked, denies ETOH, no drugs. Drinks diet coke. No exercise.       DPR daughters Eleanor and Keniah Klemmer twins            Immunization History  Administered Date(s) Administered   Influenza Inj Mdck Quad With Preservative 09/30/2014   Influenza, Quadrivalent, Recombinant, Inj, Pf 10/14/2013   Influenza, Seasonal, Injecte, Preservative Fre 09/19/2016   Influenza,inj,Quad PF,6+ Mos 09/30/2014, 09/23/2017, 09/03/2018, 10/02/2022   Influenza,inj,quad, With Preservative 09/23/2017   Influenza-Unspecified 09/30/2014, 09/30/2016, 09/03/2018, 09/10/2019, 09/28/2020, 09/28/2021, 10/03/2023   PFIZER(Purple Top)SARS-COV-2 Vaccination 12/28/2019, 01/18/2020    Pneumococcal Conjugate-13 07/16/2016   Pneumococcal Polysaccharide-23 01/01/2004, 10/15/2006, 01/04/2020   Td 10/01/2003   Tdap 01/09/2011, 06/25/2018   Zoster Recombinant(Shingrix) 09/14/2020, 12/15/2020     Objective: Vital Signs: BP 134/79   Pulse 73   Temp (!) 97.1 F (36.2 C)   Resp 14   Ht 5' 2 (1.575 m)   Wt 184 lb 6.4 oz (83.6 kg)   BMI 33.73 kg/m    Physical Exam Vitals and nursing note reviewed.  Constitutional:      Appearance: She is well-developed.  HENT:     Head: Normocephalic and atraumatic.  Eyes:     Conjunctiva/sclera: Conjunctivae normal.  Cardiovascular:     Rate and Rhythm: Normal rate and regular rhythm.     Heart sounds: Normal heart sounds.  Pulmonary:     Effort: Pulmonary effort is normal.     Breath sounds: Normal breath sounds.  Abdominal:     General: Bowel sounds are normal.     Palpations: Abdomen is soft.  Musculoskeletal:     Cervical back: Normal range of motion.  Lymphadenopathy:     Cervical: No cervical adenopathy.  Skin:    General: Skin is warm and dry.     Capillary Refill: Capillary refill takes less than 2 seconds.  Neurological:     Mental Status: She is alert and oriented to person, place, and time.  Psychiatric:        Behavior: Behavior normal.      Musculoskeletal Exam: Lateral rotation of the cervical spine without discomfort.  Thoracic kyphosis was noted without any tenderness.  She had limited extension of her left elbow joint which is unchanged.  Shoulder joints with good range of motion.  She had bilateral flexor tendon thickening with Dupuytren's contracture of her left middle finger.  PIP and DIP thickening with no synovitis was noted.  PIP joints and knee joints in good range of motion  without any warmth swelling or effusion.  There was no tenderness over her ankles or MTPs.  CDAI Exam: CDAI Score: -- Patient Global: 40 / 100; Provider Global: 20 / 100 Swollen: --; Tender: -- Joint Exam 10/14/2024    No joint exam has been documented for this visit   There is currently no information documented on the homunculus. Go to the Rheumatology activity and complete the homunculus joint exam.  Investigation: No additional findings.  Imaging: No results found.  Recent Labs: Lab Results  Component Value Date   WBC 5.5 09/18/2024   HGB 12.3 09/18/2024   PLT 174 09/18/2024   NA 139 07/01/2024   K 4.4 07/01/2024   CL 103 07/01/2024   CO2 24 07/01/2024   GLUCOSE 125 (H) 07/01/2024   BUN 20 07/01/2024   CREATININE 0.77 07/01/2024   BILITOT 0.6 07/01/2024   ALKPHOS 76 11/26/2023   AST 30 07/01/2024   ALT 22 07/01/2024   PROT 6.8 07/01/2024   ALBUMIN 4.4 11/26/2023   CALCIUM  9.4 07/01/2024   GFRAA 86 04/07/2021   QFTBGOLDPLUS NEGATIVE 10/31/2021    Speciality Comments: TB Gold: 10/15/2023 Neg   PLQ Eye Exam: 01/06/2020 ABNORMAL @ [facility]. Patient advised to discontinue PLQ at office visit on 01/06/2020.  Prior Therapy: methotrexate/Xeljanz/Actemra  (GI upset), Enbrel (injection site reaction) and Orencia/Remicade/Humira/Cimzia/Rituxan/Simponi /Simponi  Aria (inadequate response).  Procedures:  No procedures performed Allergies: Actemra  [tocilizumab ], Ramipril, Shellfish protein-containing drug products, Atorvastatin, Compazine  [prochlorperazine edisylate], Emgality  [galcanezumab -gnlm], Etanercept, Infliximab, Iohexol , Orencia [abatacept], Prochlorperazine edisylate, Tofacitinib, Trokendi  xr [topiramate  er], Shrimp extract, Topiramate , Tramadol , Amiodarone, and Rituximab   Assessment / Plan:     Visit Diagnoses: Rheumatoid arthritis of multiple sites with negative rheumatoid factor (HCC) - Ultrasound of both hands negative for synovitis and tenosynovitis on 03/14/2023: Patient denies having a flare of rheumatoid arthritis since the last visit.  She was off Rinvoq  and leflunomide  for 2 weeks for upper eyelid surgery in September.  She had no synovitis on the  examination.  She states since she has changed jobs she has less stress on her joints.  No synovitis was noted on the examination today.  High risk medication use - Rinvoq  15 mg 1 tablet by mouth once daily and Arava  20 mg 1 tablet by mouth daily. -September 18, 2024 CBC was normal.  July 01, 2024 CMP was normal.  Will check labs today including TB Gold.  Last TB Gold was in 2024.  Plan: Comprehensive metabolic panel with GFR, QuantiFERON-TB Gold Plus  Psoriasis-no active lesions were noted.  Contracture of joint of both elbows - Rheumatoid nodule noted on extensor surface of the left elbow.  Contractures were unchanged without any synovitis.  Chronic pain of right knee-intermittent discomfort in her knee joints.  No warmth swelling or effusion was noted.  DDD (degenerative disc disease), thoracic - Postural thoracic kyphosis noted.  Unchanged.  Spondylosis of lumbar spine-she denies any discomfort today.  She had no point tenderness.  Age-related osteoporosis without current pathological fracture  - DEXA on 11/11/19: The BMD measured at Femur Neck Left is 0.901 g/cm2 with a T-score of -1.0-Normal.  March 03, 2024 The BMD measured at Forearm Radius is 0.54 g/cm2 with a T-score of -3.8.  I discussed the DEXA results with the patient.  Will obtain SPEP, PTH, phosphorus and TSH.  Patient plans to return for these labs tomorrow.  I also discussed different treatment options at length.  Side effects of medications were discussed.  Will proceed  with Forteo or Tymlos.  Calcium  rich diet and vitamin D  was discussed.  There is no history of previous fracture.  There is no history of radiation therapy or bone cancer.  Other medical problems are listed as follows:  Other fatigue  Other insomnia  History of vitamin D  deficiency-will check vitamin D  level.  History of Graves' disease  History of gastroesophageal reflux (GERD)  History of coronary artery disease  History of diabetes  mellitus  History of hyperlipidemia - Plan: Lipid panel  History of migraine  History of hypertension-blood pressure was normal at 134/79.  Orders: Orders Placed This Encounter  Procedures   Comprehensive metabolic panel with GFR   Lipid panel   QuantiFERON-TB Gold Plus   Parathyroid hormone, intact (no Ca)   VITAMIN D  25 Hydroxy (Vit-D Deficiency, Fractures)   Phosphorus   Serum protein electrophoresis with reflex   No orders of the defined types were placed in this encounter.  Follow-Up Instructions: Return in about 5 months (around 03/14/2025) for Rheumatoid arthritis.   Maya Nash, MD  Note - This record has been created using Animal nutritionist.  Chart creation errors have been sought, but may not always  have been located. Such creation errors do not reflect on  the standard of medical care.

## 2024-10-02 ENCOUNTER — Ambulatory Visit: Admitting: Physical Medicine and Rehabilitation

## 2024-10-02 ENCOUNTER — Other Ambulatory Visit: Payer: Self-pay

## 2024-10-05 ENCOUNTER — Telehealth: Payer: Self-pay

## 2024-10-05 NOTE — Telephone Encounter (Signed)
 Submitted a Prior Authorization RENEWAL request to [employer] for RINVOQ  via CoverMyMeds. Will update once we receive a response.  Key: [id number]

## 2024-10-06 ENCOUNTER — Other Ambulatory Visit: Payer: Self-pay

## 2024-10-07 ENCOUNTER — Other Ambulatory Visit: Payer: Self-pay | Admitting: Cardiovascular Disease

## 2024-10-07 NOTE — Telephone Encounter (Signed)
 Received notification from [facility] regarding a prior authorization for RINVOQ . Authorization has been APPROVED from 10/07/24 to 10/06/25. Approval letter sent to scan center.  Authorization # [id number]  Sherry Pennant, PharmD, MPH, BCPS, CPP Clinical Pharmacist [facility])

## 2024-10-08 ENCOUNTER — Ambulatory Visit: Admitting: Podiatry

## 2024-10-08 ENCOUNTER — Other Ambulatory Visit: Payer: Self-pay

## 2024-10-08 VITALS — Ht 62.0 in | Wt 179.0 lb

## 2024-10-08 DIAGNOSIS — M65971 Unspecified synovitis and tenosynovitis, right ankle and foot: Secondary | ICD-10-CM

## 2024-10-08 DIAGNOSIS — M722 Plantar fascial fibromatosis: Secondary | ICD-10-CM | POA: Diagnosis not present

## 2024-10-08 MED ORDER — POTASSIUM CHLORIDE CRYS ER 20 MEQ PO TBCR
20.0000 meq | EXTENDED_RELEASE_TABLET | Freq: Every day | ORAL | 3 refills | Status: AC
  Filled 2024-12-01: qty 90, 90d supply, fill #0

## 2024-10-08 NOTE — Progress Notes (Signed)
 Subjective:  Patient ID: REMMY [name], female    DOB: 1966-10-18,  MRN: [id number]  Chief Complaint  Patient presents with   Plantar Fasciitis    Rm 21 Return in about 1 month (around 09/25/2024) for recheck plantar fasciitis, injection, stress fx. Patient states a 98% improvement in pain.    Discussed the use of AI scribe software for clinical note transcription with the patient, who gave verbal consent to proceed.  History of Present Illness [name] is a 58 year old female with type 1 diabetes and rheumatoid arthritis who presents with bilateral foot pain and swelling.  She experiences bilateral foot pain and swelling, with the right foot being more seve right second toe after the injection and still has acute tenderness, heel pain has resolved.    Rely affected. The pain is significant in the middle three toes and heel of the left foot, and there is swelling in the right foot. The symptoms have persisted for approximately six weeks and are worsening, causing sleep disturbances despite taking Tylenol  3 at night. She has not used a walking boot or taken any specific medication for the foot pain, aside from Tylenol  3 for her back and neck issues.  No recent injuries to her feet have occurred, and she has not experienced burning or tingling sensations, indicating no neuropathy symptoms. A previous monofilament test did not show any sensory neuropathy. She has a history of a soft tissue tumor removal from the top of her foot, which was surgically excised due to its size.  Her past medical history includes type 1 diabetes since age 36, with a current A1c of 6.1, and rheumatoid arthritis, for which she is on Rinvoq . She mentions generalized joint pain and swelling, particularly in her ankle recently, and has received steroid treatment without significant improvement. No kidney function issues or history of sciatica are reported.  Recent radiographs showed arterial calcifications in the  dorsal and plantar vasculature, with no evidence of acute stress fractures or bony callus.  She has an allergy to shellfish and iodinated contrast dye from CT scans, but has tolerated premedication in the past.  Interval history: She returns for follow-up with minimal change in symptoms and had completed the MRI  Objective:    Physical Exam VASCULAR: DP and PT pulse palpable. Foot is warm and well-perfused. Capillary fill time is brisk. DERMATOLOGIC: Normal skin turgor, texture, and temperature. No open lesions, rashes, or ulcerations. NEUROLOGIC: Normal sensation to light touch and pressure. No paresthesias on examination.  ORTHOPEDIC: Little to no pain in the second MTP joint.  No pain in the heel.       Results LABS A1c: 6.1%  RADIOLOGY Foot X-ray: Arterial calcifications of the dorsal and plantar vasculature. No evidence of acute stress fracture or fracture. No bony callus noted. Pes cavus foot type with metatarsus adductus, some dorsal spurring in the midfoot. (07/07/2024)  MRI right foot 08/13/2024 IMPRESSION: 1. Periarticular marrow edema on either side of the second MTP joint. Small second MTP joint effusion and synovitis. No erosive changes. Findings are nonspecific but can be seen in the setting of an inflammatory arthropathy versus stress reaction versus less likely septic arthritis. 2. Mild osteoarthritis of the navicular-medial cuneiform joint with subchondral marrow edema. 3. Mild osteoarthritis of the second TMT joint.     Electronically Signed   By: Julaine Blanch M.D.   On: 08/24/2024 12:52   Assessment:   1. Synovitis of right foot   2. Plantar fasciitis of  left foot      Plan:  Patient was evaluated and treated and all questions answered.  Assessment and Plan Assessment & Plan Foot Pain and Swelling Doing much better.  No further injection therapy at this point.  May continue home PT as needed for her plantar fasciitis on the left if returns or  recurs.  Follow-up with me as needed for this and other issues.      No follow-ups on file.

## 2024-10-10 ENCOUNTER — Other Ambulatory Visit: Payer: Self-pay | Admitting: Nurse Practitioner

## 2024-10-13 ENCOUNTER — Other Ambulatory Visit: Payer: Self-pay

## 2024-10-13 MED ORDER — LOSARTAN POTASSIUM 100 MG PO TABS
100.0000 mg | ORAL_TABLET | Freq: Every day | ORAL | 3 refills | Status: AC
  Filled 2024-10-15: qty 90, 90d supply, fill #0
  Filled 2025-01-10: qty 90, 90d supply, fill #1

## 2024-10-14 ENCOUNTER — Encounter: Payer: Self-pay | Admitting: Rheumatology

## 2024-10-14 ENCOUNTER — Ambulatory Visit: Attending: Rheumatology | Admitting: Rheumatology

## 2024-10-14 ENCOUNTER — Encounter (INDEPENDENT_AMBULATORY_CARE_PROVIDER_SITE_OTHER): Payer: Self-pay

## 2024-10-14 VITALS — BP 134/79 | HR 73 | Temp 97.1°F | Resp 14 | Ht 62.0 in | Wt 184.4 lb

## 2024-10-14 DIAGNOSIS — Z8719 Personal history of other diseases of the digestive system: Secondary | ICD-10-CM

## 2024-10-14 DIAGNOSIS — M47816 Spondylosis without myelopathy or radiculopathy, lumbar region: Secondary | ICD-10-CM | POA: Diagnosis not present

## 2024-10-14 DIAGNOSIS — Z79899 Other long term (current) drug therapy: Secondary | ICD-10-CM | POA: Diagnosis not present

## 2024-10-14 DIAGNOSIS — R5383 Other fatigue: Secondary | ICD-10-CM

## 2024-10-14 DIAGNOSIS — M24522 Contracture, left elbow: Secondary | ICD-10-CM

## 2024-10-14 DIAGNOSIS — M5134 Other intervertebral disc degeneration, thoracic region: Secondary | ICD-10-CM | POA: Diagnosis not present

## 2024-10-14 DIAGNOSIS — M24521 Contracture, right elbow: Secondary | ICD-10-CM | POA: Diagnosis not present

## 2024-10-14 DIAGNOSIS — Z8679 Personal history of other diseases of the circulatory system: Secondary | ICD-10-CM

## 2024-10-14 DIAGNOSIS — M25561 Pain in right knee: Secondary | ICD-10-CM | POA: Diagnosis not present

## 2024-10-14 DIAGNOSIS — G8929 Other chronic pain: Secondary | ICD-10-CM

## 2024-10-14 DIAGNOSIS — M810 Age-related osteoporosis without current pathological fracture: Secondary | ICD-10-CM

## 2024-10-14 DIAGNOSIS — L409 Psoriasis, unspecified: Secondary | ICD-10-CM

## 2024-10-14 DIAGNOSIS — G4709 Other insomnia: Secondary | ICD-10-CM

## 2024-10-14 DIAGNOSIS — M0609 Rheumatoid arthritis without rheumatoid factor, multiple sites: Secondary | ICD-10-CM | POA: Diagnosis not present

## 2024-10-14 DIAGNOSIS — Z8639 Personal history of other endocrine, nutritional and metabolic disease: Secondary | ICD-10-CM

## 2024-10-14 DIAGNOSIS — Z8669 Personal history of other diseases of the nervous system and sense organs: Secondary | ICD-10-CM

## 2024-10-14 DIAGNOSIS — G5701 Lesion of sciatic nerve, right lower limb: Secondary | ICD-10-CM

## 2024-10-14 DIAGNOSIS — M8589 Other specified disorders of bone density and structure, multiple sites: Secondary | ICD-10-CM

## 2024-10-14 NOTE — Patient Instructions (Addendum)
 Standing Labs We placed an order today for your standing lab work.   Please have your standing labs drawn in January and every 3 months  Please have your labs drawn 2 weeks prior to your appointment so that the provider can discuss your lab results at your appointment, if possible.  Please note that you may see your imaging and lab results in MyChart before we have reviewed them. We will contact you once all results are reviewed. Please allow our office up to 72 hours to thoroughly review all of the results before contacting the office for clarification of your results.  WALK-IN LAB HOURS  Monday through Thursday from 8:00 am -12:30 pm and 1:00 pm-4:30 pm and Friday from 8:00 am-12:00 pm.  Patients with office visits requiring labs will be seen before walk-in labs.  You may encounter longer than normal wait times. Please allow additional time. Wait times may be shorter on  Monday and Thursday afternoons.  We do not book appointments for walk-in labs. We appreciate your patience and understanding with our staff.   Labs are drawn by [facility]. Please bring your co-pay at the time of your lab draw.  You may receive a bill from [facility] for your lab work.  Please note if you are on Hydroxychloroquine  and and an order has been placed for a Hydroxychloroquine  level,  you will need to have it drawn 4 hours or more after your last dose.  If you wish to have your labs drawn at another location, please call the office 24 hours in advance so we can fax the orders.  The office is located at [address], [place], [place] [address]   If you have any questions regarding directions or hours of operation,  please call [phone].   As a reminder, please drink plenty of water prior to coming for your lab work. Thanks!   Vaccines You are taking a medication(s) that can suppress your immune system.  The following immunizations are recommended: Flu annually Covid-19  RSV Td/Tdap (tetanus,  diphtheria, pertussis) every 10 years Pneumonia (Prevnar 15 then Pneumovax 23 at least 1 year apart.  Alternatively, can take Prevnar 20 without needing additional dose) Shingrix: 2 doses from 4 weeks to 6 months apart  Please check with your PCP to make sure you are up to date.   If you have signs or symptoms of an infection or start antibiotics: First, call your PCP for workup of your infection. Hold your medication through the infection, until you complete your antibiotics, and until symptoms resolve if you take the following: Injectable medication (Actemra , Benlysta, Cimzia, Cosentyx, Enbrel, Humira, Kevzara, Orencia, Remicade, Simponi , Stelara, Taltz, Tremfya) Methotrexate Leflunomide  (Arava ) Mycophenolate (Cellcept) Xeljanz, Olumiant, or Rinvoq    Because you are taking Earma, Rinvoq , or Olumiant, it is very important to know that this class of medications has a FDA BOXED WARNING for major adverse cardiovascular events (MACE), thrombosis, mortality (including sudden cardiovascular death), serious infections, and lymphomas. MACE is defined as cardiovascular death, myocardial infarction, and stroke. Thrombosis includes deep venous thrombosis (DVT), pulmonary embolism (PE), and arterial thrombosis. If you are a current or former smoker, you are at higher risk for MACE.   Please get an annual skin examination to screen for skin cancer while you are on Rinvoq .  Please use sunscreen and sun protection.  Abaloparatide Injection What is this medication? ABALOPARATIDE (a ball oh PAR a tide) treats osteoporosis. It works by Interior and spatial designer stronger and less likely to break (fracture).  This medicine may be used for other purposes; ask your health care provider or pharmacist if you have questions. COMMON BRAND NAME(S): TYMLOS What should I tell my care team before I take this medication? They need to know if you have any of these conditions: Bone disease other than osteoporosis High levels of  an enzyme called alkaline phosphatase in the blood High levels of calcium  in the blood History of cancer in the bone Kidney stone Paget's disease Parathyroid disease Receiving radiation therapy An unusual or allergic reaction to abaloparatide, other medications, foods, dyes, or preservatives Pregnant or trying to get pregnant Breast-feeding How should I use this medication? This medication is injected under the skin. You will be taught how to prepare and give it. Take it as directed on the prescription label at the same time every day. Keep taking it unless your care team tells you to stop. It is important that you put your used needles and pens in a special sharps container. Do not put them in a trash can. If you do not have a sharps container, call your pharmacist or care team to get one. A special MedGuide will be given to you by the pharmacist with each prescription and refill. Be sure to read this information carefully each time. Talk to your care team about the use of this medication in children. Special care may be needed. Overdosage: If you think you have taken too much of this medicine contact a poison control center or emergency room at once. NOTE: This medicine is only for you. Do not share this medicine with others. What if I miss a dose? If you miss a dose, take it as soon as you can. If it is almost time for your next dose, take only that dose. Do not take double or extra doses. What may interact with this medication? Interactions have not been studied. This list may not describe all possible interactions. Give your health care provider a list of all the medicines, herbs, non-prescription drugs, or dietary supplements you use. Also tell them if you smoke, drink alcohol, or use illegal drugs. Some items may interact with your medicine. What should I watch for while using this medication? Visit your care team for regular checks on your progress. You may need blood work done while  you are taking this medication. This medication may affect your coordination, reaction time, or judgment. Do not drive or operate machinery until you know how this medication affects you. Sit up or stand slowly to reduce the risk of dizzy or fainting spells. Drinking alcohol with this medication can increase the risk of these side effects. You should make sure that you get enough calcium  and vitamin D  while you are taking this medication. Discuss the foods you eat and the vitamins you take with your care team. Talk to your care team about your risk of cancer. You may be more at risk for certain types of cancers if you take this medication. Do not share pens with anyone, even if the needle is changed. Each pen should only be used by one person. Sharing could cause an infection. What side effects may I notice from receiving this medication? Side effects that you should report to your care team as soon as possible: Allergic reactions--skin rash, itching, hives, swelling of the face, lips, tongue, or throat High calcium  level--increased thirst or amount of urine, nausea, vomiting, confusion, unusual weakness or fatigue, bone pain Kidney stones--blood in the urine, pain or trouble passing urine, pain  in the lower back or sides Low blood pressure--dizziness, feeling faint or lightheaded, blurry vision Side effects that usually do not require medical attention (report these to your care team if they continue or are bothersome): Dizziness Fatigue Headache Nausea Pain, redness, or irritation at injection site This list may not describe all possible side effects. Call your doctor for medical advice about side effects. You may report side effects to FDA at 1-800-FDA-1088. Where should I keep my medication? Keep out of the reach of children and pets. Store unopened pens in the refrigerator between 2 and 8 degrees C (36 and 46 degrees F). Do not freeze. Avoid exposure to heat. Get rid of any unopened  medication after the expiration date. After the first use, store your pen for up to 30 days at room temperature between 20 and 25 degrees C (68 and 77 degrees F). Do not store with a needle attached to the pen. Use the pen for only 30 days. Get rid of your pen 30 days after first opening it even if the pen still contains medication. To get rid of medications that are no longer needed or have expired: Take the medication to a medication take-back program. Check with your pharmacy or law enforcement to find a location. If you cannot return the medication, ask your pharmacist or care team how to get rid of this medication safely. NOTE: This sheet is a summary. It may not cover all possible information. If you have questions about this medicine, talk to your doctor, pharmacist, or health care provider.  2024 Elsevier/Gold Standard (2023-03-15 00:00:00)

## 2024-10-15 ENCOUNTER — Ambulatory Visit: Payer: Self-pay | Admitting: Rheumatology

## 2024-10-15 ENCOUNTER — Other Ambulatory Visit: Payer: Self-pay | Admitting: *Deleted

## 2024-10-15 ENCOUNTER — Other Ambulatory Visit: Payer: Self-pay | Admitting: Rheumatology

## 2024-10-15 DIAGNOSIS — M810 Age-related osteoporosis without current pathological fracture: Secondary | ICD-10-CM

## 2024-10-15 NOTE — Progress Notes (Signed)
 CMP normal, lipid panel

## 2024-10-15 NOTE — Progress Notes (Signed)
 Staff message sent to pharmacy pool to start Forteo or Tymlos benefits investigation

## 2024-10-16 ENCOUNTER — Telehealth: Payer: Self-pay

## 2024-10-16 ENCOUNTER — Other Ambulatory Visit: Payer: Self-pay

## 2024-10-16 DIAGNOSIS — M810 Age-related osteoporosis without current pathological fracture: Secondary | ICD-10-CM

## 2024-10-16 LAB — QUANTIFERON-TB GOLD PLUS
Mitogen-NIL: 6.15 [IU]/mL
NIL: 0.02 [IU]/mL
QuantiFERON-TB Gold Plus: NEGATIVE
TB1-NIL: 0.01 [IU]/mL
TB2-NIL: 0.01 [IU]/mL

## 2024-10-16 LAB — COMPREHENSIVE METABOLIC PANEL WITH GFR
AG Ratio: 2.3 (calc) (ref 1.0–2.5)
ALT: 19 U/L (ref 6–29)
AST: 23 U/L (ref 10–35)
Albumin: 4.5 g/dL (ref 3.6–5.1)
Alkaline phosphatase (APISO): 73 U/L (ref 37–153)
BUN: 10 mg/dL (ref 7–25)
CO2: 29 mmol/L (ref 20–32)
Calcium: 9.3 mg/dL (ref 8.6–10.4)
Chloride: 101 mmol/L (ref 98–110)
Creat: 0.74 mg/dL (ref 0.50–1.03)
Globulin: 2 g/dL (ref 1.9–3.7)
Glucose, Bld: 98 mg/dL (ref 65–99)
Potassium: 3.9 mmol/L (ref 3.5–5.3)
Sodium: 139 mmol/L (ref 135–146)
Total Bilirubin: 0.4 mg/dL (ref 0.2–1.2)
Total Protein: 6.5 g/dL (ref 6.1–8.1)
eGFR: 95 mL/min/1.73m2 (ref >=60)

## 2024-10-16 LAB — LIPID PANEL
Cholesterol: 157 mg/dL (ref <200)
HDL: 78 mg/dL (ref >=50)
LDL Cholesterol (Calc): 63 mg/dL
Non-HDL Cholesterol (Calc): 79 mg/dL (ref <130)
Total CHOL/HDL Ratio: 2 (calc) (ref <5.0)
Triglycerides: 82 mg/dL (ref <150)

## 2024-10-16 MED ORDER — CYCLOBENZAPRINE HCL 10 MG PO TABS
10.0000 mg | ORAL_TABLET | Freq: Every evening | ORAL | 2 refills | Status: DC | PRN
  Filled 2024-10-16 (×2): qty 30, 30d supply, fill #0
  Filled 2024-11-13: qty 30, 30d supply, fill #1
  Filled 2024-12-21: qty 30, 30d supply, fill #2

## 2024-10-16 NOTE — Telephone Encounter (Signed)
 [name], RPH-CPP  P Rx Rheum/Pulm Tymlos or Forteo new start    March 03, 2024 The BMD measured at [facility] is 0.54 g/cm2 with a T-score of -3.8.  I discussed the DEXA results with the patient.  Will obtain SPEP, PTH, phosphorus and TSH.  Patient plans to return for these labs tomorrow.  I also discussed different treatment options at length.  Side effects of medications were discussed.  Will proceed with Forteo or Tymlos.  Calcium  rich diet and vitamin D  was discussed.  There is no history of previous fracture.  There is no history of radiation therapy or bone cancer. -------------------------------------------------------------------------------------  Submitted a Prior Authorization request to [facility] for FORTEO via CoverMyMeds. Will update once we receive a response.  Key: [id number]

## 2024-10-16 NOTE — Telephone Encounter (Signed)
 Last Fill: 07/17/2024  Next Visit: 03/16/2025  Last Visit: 10/14/2024  Dx: Rheumatoid arthritis of multiple sites with negative rheumatoid factor   Current Dose per office note on 10/14/2024: not discussed  Okay to refill Flexeril ?

## 2024-10-17 NOTE — Progress Notes (Signed)
TB Gold negative

## 2024-10-19 ENCOUNTER — Other Ambulatory Visit: Payer: Self-pay

## 2024-10-19 ENCOUNTER — Other Ambulatory Visit: Payer: Self-pay | Admitting: Internal Medicine

## 2024-10-19 DIAGNOSIS — E10319 Type 1 diabetes mellitus with unspecified diabetic retinopathy without macular edema: Secondary | ICD-10-CM | POA: Diagnosis not present

## 2024-10-19 MED ORDER — INSULIN LISPRO 100 UNIT/ML IJ SOLN
75.0000 [IU] | Freq: Every day | INTRAMUSCULAR | 3 refills | Status: AC
  Filled 2024-11-13: qty 90, 90d supply, fill #0
  Filled 2025-02-02: qty 90, 90d supply, fill #1

## 2024-10-21 ENCOUNTER — Ambulatory Visit (INDEPENDENT_AMBULATORY_CARE_PROVIDER_SITE_OTHER): Admitting: Family Medicine

## 2024-10-21 ENCOUNTER — Encounter (INDEPENDENT_AMBULATORY_CARE_PROVIDER_SITE_OTHER): Payer: Self-pay | Admitting: Family Medicine

## 2024-10-21 VITALS — BP 138/82 | HR 88 | Temp 98.7°F | Ht 62.5 in | Wt 180.0 lb

## 2024-10-21 DIAGNOSIS — E559 Vitamin D deficiency, unspecified: Secondary | ICD-10-CM | POA: Diagnosis not present

## 2024-10-21 DIAGNOSIS — E1159 Type 2 diabetes mellitus with other circulatory complications: Secondary | ICD-10-CM

## 2024-10-21 DIAGNOSIS — E108 Type 1 diabetes mellitus with unspecified complications: Secondary | ICD-10-CM

## 2024-10-21 DIAGNOSIS — I152 Hypertension secondary to endocrine disorders: Secondary | ICD-10-CM | POA: Diagnosis not present

## 2024-10-21 DIAGNOSIS — E669 Obesity, unspecified: Secondary | ICD-10-CM | POA: Diagnosis not present

## 2024-10-21 DIAGNOSIS — Z794 Long term (current) use of insulin: Secondary | ICD-10-CM

## 2024-10-21 DIAGNOSIS — E1059 Type 1 diabetes mellitus with other circulatory complications: Secondary | ICD-10-CM

## 2024-10-21 DIAGNOSIS — Z6832 Body mass index (BMI) 32.0-32.9, adult: Secondary | ICD-10-CM | POA: Diagnosis not present

## 2024-10-21 NOTE — Progress Notes (Signed)
 [name] [name] [name], D.O.  ABFM, ABOM Specializing in Clinical Bariatric Medicine  Office located at: [address] [place], [place]  [address]    FOR THE CHRONIC DISEASE OF OBESITY:   Obesity (BMI 30-39.9), Starting BMI 11/26/23 32.02; Obesity, current BMI 32.38  Weight Summary and Body Composition Analysis  Weight Lost Since Last Visit: 0  Weight Gained Since Last Visit: 6lb    Vitals Temp: 98.7 F (37.1 C) BP: 138/82 Pulse Rate: 88 SpO2: 97 %   Anthropometric Measurements Height: 5' 2.5 (1.588 m) Weight: 180 lb (81.6 kg) BMI (Calculated): 32.38 Weight at Last Visit: 174lb Weight Lost Since Last Visit: 0 Weight Gained Since Last Visit: 6lb Starting Weight: 178lb Total Weight Loss (lbs): 0 lb (0 kg)   Body Composition  Body Fat %: 46.2 % Fat Mass (lbs): 83.6 lbs Muscle Mass (lbs): 92.2 lbs Total Body Water (lbs): 73.8 lbs Visceral Fat Rating : 12   Other Clinical Data Fasting: no Labs: no Today's Visit #: 7 Starting Date: 11/26/23   Chief complaint: Obesity  Interval History [name] is here for a follow-up office visit to discuss her progress with her obesity treatment plan. [name] is on the Vegetarian Plan and states [name] is following her eating plan approximately 75 % of the time. [name] is walking 30  minutes 2-3 days per week  [name] has experienced a weight gain of 6 lbs since last OV on 06/24/2024. [name] has been quite busy since our last visit with the biggest change being a job switch.  Her dietary and life habits include:  - Tracking Calories/Macros: yes  - Eating More Whole Foods: yes  - Adequate Protein Intake: yes  - Adequate Water Intake: no  - Skipping Meals: yes - often skips breakfast  - Sleeping 7-9 Hours/ Night: sleeps close to 7 hours per nite   06/24/24 15:00 10/21/24   Body Fat % 44.6 % 46.2 %  Muscle Mass (lbs) 91.6 lbs 92.2 lbs  Fat Mass (lbs) 77.6 lbs 83.6 lbs  Total Body Water (lbs) 74.6 lbs 73.8 lbs  Visceral Fat  Rating  11 12    Counseling done on how various foods will affect these numbers and how to maximize success  Total lbs lost to date: + 2 lbs Total Fat Mass lost to date: - 2.2 lbs Total weight loss percentage to date: + 1.12 %   Nutritional and Behavioral Counseling:  We discussed the following today: protein powder brands, increasing lean protein intake to established goals and continue to work on implementation of reduced calorie nutritional plan  Additional resources provided today: Handout on Daily Food Journaling Log  Evidence-based interventions for health behavior change were utilized today including the discussion of self monitoring techniques, problem-solving barriers and SMART goal setting techniques.   Regarding patient's less desirable eating habits and patterns, we employed the technique of small changes.   SMART Goal(s) created today:    Recommended Dietary Goals [name] is currently in the action stage of change. As such, her goal is to continue weight management plan.  [name] has agreed to continue to follow our protein rich vegetarian plan   Recommended Physical Activity Goals [name] has been advised to work up to 300-450 minutes of moderate intensity aerobic activity a week and strengthening exercises 2-3 times per week for cardiovascular health, weight loss maintenance and preservation of muscle mass.   [name] may continue to gradually increase the amount and intensity of exercise routine   Medical  Interventions and Pharmacotherapy Previous Bariatric surgery: n/a Pharmacotherapy: Was prescribed GLP-1 in the past by endocrinologist but denied by insurance and cost prohibitive.    OBESITY RELATED CONDITIONS ADDRESSED TODAY:    Type 1 diabetes mellitus with complications [facility]) Assessment & Plan: Lab Results  Component Value Date   HGBA1C 6.1 (A) 06/09/2024   HGBA1C 6.0 (A) 01/27/2024   HGBA1C 6.3 (A) 09/26/2023   INSULIN  <0.4 (L) 11/26/2023    Managed by  endocrinology. Was prescribed GLP-1 in the past by endocrinologist but denied by insurance and cost prohibitive. On an insulin  pump. HgbA1c stable for age and comorbid conditions.States that her blood sugars have been labile. [name] is seeing crashes at night and highs and lows at other times.          Does endorse having some hunger and cravings.              Reminded pt about the importance of getting yearly DM eye & foot exams and urine microalb: crt ratio tests.  Continue with reduced calorie meal plan low on processed crabs and simple sugars. Ongoing weight loss will improve insulin  resistance and glycemic control       Hypertension associated with diabetes (HCC) Assessment & Plan: Last 3 blood pressure readings in our office are as follows: BP Readings from Last 3 Encounters:  10/21/24 138/82  10/14/24 134/79  09/18/24 134/74   BP stable today. No acute concerns. Continue Imdur  60 mg daily, Losartan  100 mg daily and Lopressor  25 mg twice daily. Continue with low sodium diet, advance exercise as tolerated.    Vitamin D  deficiency Assessment & Plan: Lab Results  Component Value Date   VD25OH 47 10/15/2024   VD25OH 92.9 11/26/2023   VD25OH 74.19 12/05/2022   Pt is doing well on OTC Vit D3 5000 units every other day. No acute concerns. Continue same regimen. Recheck as deemed medically necessary.    Objective:   PHYSICAL EXAM: Blood pressure 138/82, pulse 88, temperature 98.7 F (37.1 C), height 5' 2.5 (1.588 m), weight 180 lb (81.6 kg), SpO2 97%. Body mass index is 32.4 kg/m.  General: [name] is overweight, cooperative and in no acute distress. PSYCH: Has normal mood, affect and thought process.   HEENT: EOMI, sclerae are anicteric. Lungs: Normal breathing effort, no conversational dyspnea. Extremities: Moves * 4 Neurologic: A and O * 3, good insight  DIAGNOSTIC DATA REVIEWED: BMET    Component Value Date/Time   NA 139 10/14/2024 1608   NA  140 11/26/2023 1123   K 3.9 10/14/2024 1608   CL 101 10/14/2024 1608   CO2 29 10/14/2024 1608   GLUCOSE 98 10/14/2024 1608   BUN 10 10/14/2024 1608   BUN 10 11/26/2023 1123   CREATININE 0.74 10/14/2024 1608   CALCIUM  9.3 10/14/2024 1608   GFRNONAA >60 06/01/2021 0757   GFRNONAA 74 04/07/2021 0855   GFRAA 86 04/07/2021 0855   Lab Results  Component Value Date   HGBA1C 6.1 (A) 06/09/2024   HGBA1C 6.8 06/26/2007   Lab Results  Component Value Date   INSULIN  <0.4 (L) 11/26/2023   Lab Results  Component Value Date   TSH 2.050 11/26/2023   CBC    Component Value Date/Time   WBC 5.5 09/18/2024 1502   WBC 4.2 06/24/2024 1524   RBC 4.09 09/18/2024 1502   HGB 12.3 09/18/2024 1502   HGB 13.4 02/15/2020 1656   HCT 36.0 09/18/2024 1502   HCT 38.9 02/15/2020 1656   PLT 174 09/18/2024  1502   PLT 172 02/15/2020 1656   MCV 88.0 09/18/2024 1502   MCV 87 02/15/2020 1656   MCH 30.1 09/18/2024 1502   MCHC 34.2 09/18/2024 1502   RDW 12.6 09/18/2024 1502   RDW 12.8 02/15/2020 1656   Iron  Studies    Component Value Date/Time   IRON  59 09/18/2024 1502   TIBC 343 09/18/2024 1502   FERRITIN 36 09/18/2024 1502   IRONPCTSAT 17 09/18/2024 1502   Lipid Panel     Component Value Date/Time   CHOL 157 10/14/2024 1608   CHOL 162 11/26/2023 1123   TRIG 82 10/14/2024 1608   HDL 78 10/14/2024 1608   HDL 76 11/26/2023 1123   CHOLHDL 2.0 10/14/2024 1608   VLDL 19.0 12/05/2022 1558   LDLCALC 63 10/14/2024 1608   Hepatic Function Panel     Component Value Date/Time   PROT 6.6 10/15/2024 1514   PROT 6.1 11/26/2023 1123   ALBUMIN 4.4 11/26/2023 1123   AST 23 10/14/2024 1608   ALT 19 10/14/2024 1608   ALKPHOS 76 11/26/2023 1123   BILITOT 0.4 10/14/2024 1608   BILITOT 0.3 11/26/2023 1123   BILIDIR 0.1 11/08/2010 1257   IBILI 0.4 07/05/2009 0743      Component Value Date/Time   TSH 2.050 11/26/2023 1123   Nutritional Lab Results  Component Value Date   VD25OH 47 10/15/2024    VD25OH 92.9 11/26/2023   VD25OH 74.19 12/05/2022     Follow up:   Return for f/up in 3 weeks. *** [name] was informed of the importance of frequent follow up visits to maximize her success with intensive lifestyle modifications for her multiple health conditions.   Attestations:   I, Special Puri, acting as a Stage manager for [employer], DO., have compiled all relevant documentation for today's office visit on behalf of [name] [name], DO, while in the presence of [employer], DO.  Pertinent positives were addressed with patient today. Reviewed by clinician on day of visit: allergies, medications, problem list, medical history, surgical history, family history, social history, and previous encounter notes.  I have spent *** minutes in the care of the patient today including *** minutes face-to-face assessing and reviewing listed medical problems above as outlined in office visit note and providing nutritional and behavioral counseling as outlined in obesity care plan.   I have reviewed the above documentation for accuracy and completeness, and I agree with the above. [name] JINNY [name], D.O.  The 21st Century Cures Act was signed into law in 2016 which includes the topic of electronic health records.  This provides immediate access to information in MyChart. This includes consultation notes, operative notes, office notes, lab results and pathology reports.  If you have any questions about what you read please let us  know at your next visit so we can discuss your concerns and take corrective action if need be.  We are right here with you.

## 2024-10-22 ENCOUNTER — Other Ambulatory Visit: Payer: Self-pay

## 2024-10-22 LAB — VITAMIN D 25 HYDROXY (VIT D DEFICIENCY, FRACTURES): Vit D, 25-Hydroxy: 47 ng/mL (ref 30–100)

## 2024-10-22 LAB — PROTEIN ELECTROPHORESIS, SERUM, WITH REFLEX
Albumin ELP: 4.3 g/dL (ref 3.8–4.8)
Alpha 1: 0.3 g/dL (ref 0.2–0.3)
Alpha 2: 0.8 g/dL (ref 0.5–0.9)
Beta 2: 0.3 g/dL (ref 0.2–0.5)
Beta Globulin: 0.4 g/dL (ref 0.4–0.6)
Gamma Globulin: 0.4 g/dL — ABNORMAL LOW (ref 0.8–1.7)
Total Protein: 6.6 g/dL (ref 6.1–8.1)

## 2024-10-22 LAB — PARATHYROID HORMONE, INTACT (NO CA): PTH: 79 pg/mL — ABNORMAL HIGH (ref 16–77)

## 2024-10-22 LAB — PHOSPHORUS: Phosphorus: 4.2 mg/dL (ref 2.5–4.5)

## 2024-10-22 LAB — IFE INTERPRETATION

## 2024-10-22 NOTE — Progress Notes (Signed)
 PTH mildly elevated, phosphorus normal, vitamin D  normal, IFE normal.  Okay to proceed with Forteo or Tymlos as discussed during the last visit.

## 2024-10-23 ENCOUNTER — Other Ambulatory Visit (HOSPITAL_COMMUNITY): Payer: Self-pay

## 2024-10-23 NOTE — Telephone Encounter (Signed)
 Received notification from [facility] regarding a prior authorization for FORTEO. Authorization has been APPROVED from 10/20/2024 for a maximum of 24 fills without an expiration date. Approval letter sent to scan center.  Per test claim, copay for 30 days supply is $0.00. Pen needles also appear to process as a 90 day supply for $0.00  Patient must fill through [facility]: [phone]   Authorization # [id number]  Will route to [facility] for New Start scheduling.

## 2024-10-26 ENCOUNTER — Other Ambulatory Visit: Payer: Self-pay

## 2024-10-26 ENCOUNTER — Other Ambulatory Visit (HOSPITAL_COMMUNITY): Payer: Self-pay

## 2024-10-26 MED ORDER — TERIPARATIDE 560 MCG/2.24ML ~~LOC~~ SOPN
20.0000 ug | PEN_INJECTOR | Freq: Every evening | SUBCUTANEOUS | 5 refills | Status: DC
  Filled 2024-10-26: qty 2.24, fill #0

## 2024-10-26 MED ORDER — INSULIN PEN NEEDLE 31G X 5 MM MISC
2 refills | Status: AC
  Filled 2024-10-26: qty 100, fill #0
  Filled 2024-10-29: qty 100, 90d supply, fill #0
  Filled 2025-01-26: qty 100, 90d supply, fill #1

## 2024-10-26 MED ORDER — TERIPARATIDE 560 MCG/2.24ML ~~LOC~~ SOPN
20.0000 ug | PEN_INJECTOR | Freq: Every evening | SUBCUTANEOUS | 5 refills | Status: AC
  Filled 2024-10-26: qty 2.24, fill #0
  Filled 2024-10-28: qty 2.24, 28d supply, fill #0
  Filled 2024-11-19: qty 2.24, 28d supply, fill #1
  Filled 2024-12-17: qty 2.24, 28d supply, fill #2
  Filled 2025-01-11 – 2025-01-13 (×2): qty 2.24, 28d supply, fill #3

## 2024-10-26 NOTE — Telephone Encounter (Signed)
 Called patient regarding Forteo approval. She is Type 1 diabetic and states that she feels comfortable with injections. Reviewed dizziness as possible side effect. Reviewed injection sites, administration, and storage. Advised her to take at bedtime to minimize risk for falls.  She is aware that [facility] will call to set up shipment to home.  Sherry Pennant, PharmD, MPH, BCPS, CPP Clinical Pharmacist [facility])

## 2024-10-26 NOTE — Progress Notes (Signed)
 Patient to be enrolled with [facility]. Routed to Rx Rheum/Pulm (ATTN: [place]).

## 2024-10-28 ENCOUNTER — Other Ambulatory Visit: Payer: Self-pay

## 2024-10-28 ENCOUNTER — Other Ambulatory Visit (HOSPITAL_COMMUNITY): Payer: Self-pay

## 2024-10-28 NOTE — Telephone Encounter (Signed)
 Shipment has been scheduled with pt, Spec pharm encounter routed to Call Center pool for processing.

## 2024-10-28 NOTE — Progress Notes (Signed)
 Specialty Pharmacy Initial Fill Coordination Note  [name] is a 58 y.o. female contacted today regarding initial fill of specialty medication(s) Teriparatide   Patient requested Delivery   Delivery date: 10/30/24   Verified address: [address]   [place] [address]   Medication will be filled on: 10/29/24   Patient is aware of $0 copayment.

## 2024-10-28 NOTE — Progress Notes (Signed)
 Initial fill has been completed in [place].

## 2024-10-29 ENCOUNTER — Other Ambulatory Visit: Payer: Self-pay

## 2024-10-29 NOTE — Progress Notes (Signed)
 Specialty Pharmacy Ongoing Clinical Assessment Note  [name] is a 58 y.o. female who is being followed by the specialty pharmacy service for RxSp Rheumatoid Arthritis   Patient's specialty medication(s) reviewed today: Upadacitinib  (Rinvoq )   Missed doses in the last 4 weeks: 0   Patient/Caregiver did not have any additional questions or concerns.   Therapeutic benefit summary: Patient is achieving benefit   Adverse events/side effects summary: No adverse events/side effects   Patient's therapy is appropriate to: Continue    Goals Addressed             This Visit's Progress    Minimize recurrence of flares   On track    Patient is on track. Patient will maintain adherence         Follow up: 12 months  Wong Steadham M Marquis [facility] Specialty Pharmacist

## 2024-10-29 NOTE — Progress Notes (Signed)
 Specialty Pharmacy Refill Coordination Note  [name] is a 58 y.o. female contacted today regarding refills of specialty medication(s) Upadacitinib  (Rinvoq )   Patient requested Delivery   Delivery date: 11/04/24   Verified address: [address]   [place] [address]   Medication will be filled on: 11/03/24

## 2024-10-30 ENCOUNTER — Telehealth: Payer: Self-pay

## 2024-10-30 ENCOUNTER — Other Ambulatory Visit: Payer: Self-pay

## 2024-10-30 DIAGNOSIS — H04123 Dry eye syndrome of bilateral lacrimal glands: Secondary | ICD-10-CM | POA: Diagnosis not present

## 2024-10-30 DIAGNOSIS — H5212 Myopia, left eye: Secondary | ICD-10-CM | POA: Diagnosis not present

## 2024-10-30 DIAGNOSIS — Z9841 Cataract extraction status, right eye: Secondary | ICD-10-CM | POA: Diagnosis not present

## 2024-10-30 DIAGNOSIS — H40053 Ocular hypertension, bilateral: Secondary | ICD-10-CM | POA: Diagnosis not present

## 2024-10-30 DIAGNOSIS — H52213 Irregular astigmatism, bilateral: Secondary | ICD-10-CM | POA: Diagnosis not present

## 2024-10-30 DIAGNOSIS — H40023 Open angle with borderline findings, high risk, bilateral: Secondary | ICD-10-CM | POA: Diagnosis not present

## 2024-10-30 DIAGNOSIS — E103553 Type 1 diabetes mellitus with stable proliferative diabetic retinopathy, bilateral: Secondary | ICD-10-CM | POA: Diagnosis not present

## 2024-10-30 DIAGNOSIS — H16223 Keratoconjunctivitis sicca, not specified as Sjogren's, bilateral: Secondary | ICD-10-CM | POA: Diagnosis not present

## 2024-10-30 DIAGNOSIS — Z9842 Cataract extraction status, left eye: Secondary | ICD-10-CM | POA: Diagnosis not present

## 2024-10-30 MED ORDER — XIIDRA 5 % OP SOLN
1.0000 [drp] | Freq: Two times a day (BID) | OPHTHALMIC | 4 refills | Status: AC
  Filled 2024-10-30: qty 180, 90d supply, fill #0
  Filled 2025-02-02: qty 180, 90d supply, fill #1

## 2024-10-30 NOTE — Progress Notes (Signed)
 Patient with T-score of -3.8 on most recent DEXA on 03/03/2024  Starting Forteo 20mcg once nightly. She is Type 1 diabetic and states that she feels comfortable with injections. Reviewed dizziness as possible side effect. Reviewed injection sites, administration, and storage. Advised her to take at bedtime to minimize risk for falls.   Sherry Pennant, PharmD, MPH, BCPS, CPP Clinical Pharmacist [facility])

## 2024-10-30 NOTE — Telephone Encounter (Signed)
 Copied from CRM [id number]. Topic: General - Other >> Oct 30, 2024 11:51 AM Rilla B wrote: Reason for CRM: Patient called to state DME is unable to download from her machine and she has appt on Monday @ 4pm.  Please call patient @ [phone].

## 2024-11-02 ENCOUNTER — Other Ambulatory Visit: Payer: Self-pay

## 2024-11-02 ENCOUNTER — Encounter: Payer: Self-pay | Admitting: Sleep Medicine

## 2024-11-02 ENCOUNTER — Ambulatory Visit: Admitting: Sleep Medicine

## 2024-11-02 VITALS — BP 122/70 | HR 81 | Temp 98.6°F | Ht 62.0 in | Wt 186.8 lb

## 2024-11-02 DIAGNOSIS — G4733 Obstructive sleep apnea (adult) (pediatric): Secondary | ICD-10-CM

## 2024-11-02 DIAGNOSIS — Z6834 Body mass index (BMI) 34.0-34.9, adult: Secondary | ICD-10-CM

## 2024-11-02 DIAGNOSIS — E669 Obesity, unspecified: Secondary | ICD-10-CM | POA: Diagnosis not present

## 2024-11-02 DIAGNOSIS — E66811 Obesity, class 1: Secondary | ICD-10-CM

## 2024-11-02 DIAGNOSIS — F5104 Psychophysiologic insomnia: Secondary | ICD-10-CM

## 2024-11-02 MED ORDER — TRAZODONE HCL 50 MG PO TABS
50.0000 mg | ORAL_TABLET | Freq: Every day | ORAL | 3 refills | Status: AC
  Filled 2024-11-02: qty 30, 30d supply, fill #0
  Filled 2024-12-01: qty 30, 30d supply, fill #1
  Filled 2024-12-29: qty 30, 30d supply, fill #2
  Filled 2025-02-02: qty 30, 30d supply, fill #3

## 2024-11-02 NOTE — Progress Notes (Signed)
 Name:[name] MRN: [id number] DOB: [birth year]   CHIEF COMPLAINT:  CPAP F/U   HISTORY OF PRESENT ILLNESS:  [name] is a 58 y.o. w/ a h/o OSA, DMII, hyperlipidemia, HTN and obesity who presents for CPAP F/U visit. Reports using CPAP therapy every night, compliance data is currently unavailable. She is currently using the Airfit N30i nasal mask, which causes air leaks.    EPWORTH SLEEP SCORE    04/23/2024    3:00 PM  Results of the Epworth flowsheet  Sitting and reading 3  Watching TV 2  Sitting, inactive in a public place (e.g. a theatre or a meeting) 2  As a passenger in a car for an hour without a break 1  Lying down to rest in the afternoon when circumstances permit 2  Sitting and talking to someone 0  Sitting quietly after a lunch without alcohol 2  In a car, while stopped for a few minutes in traffic 0  Total score 12    PAST MEDICAL HISTORY :   has a past medical history of ACUT MI ANTEROLAT WALL SUBSQT EPIS CARE, Acute maxillary sinusitis, ALLERGIC RHINITIS, SEASONAL, Allergy, Anemia, Arthritis, ARTHRITIS, RHEUMATOID, Atrial fibrillation (HCC), Back pain, Blood transfusion without reported diagnosis, Bruxism (teeth grinding), CAD, ARTERY BYPASS GRAFT, CARPAL TUNNEL SYNDROME, BILATERAL, Cataract, CHOLELITHIASIS, Clotting disorder, Contrast media allergy, DERMATITIS, ALLERGIC, DIABETES MELLITUS, TYPE I, DIABETIC  RETINOPATHY, GERD (gastroesophageal reflux disease), Graves' disease, Heart attack (HCC), Hiatal hernia, HYPERLIPIDEMIA-MIXED, Hypertension, HYPERTHYROIDISM, IBS (irritable bowel syndrome), IDA (iron  deficiency anemia), Infertility, female, Insulin  pump in place, Lymphadenopathy of head and neck, MIGRAINE W/O AURA W/INTRACT W/STATUS MIGRAINOSUS (02/19/2008), PONV (postoperative nausea and vomiting), Psoriasis, Rheumatoid arthritis (HCC), SINUS TACHYCARDIA (11/08/2010), Sleep apnea, Stomach ulcer, SVT (supraventricular tachycardia), Tendonitis, TRIGGER  FINGER, and URI.  has a past surgical history that includes Coronary artery bypass graft; Vitrectomy; Caesarean section; Carpal tunnel release; Cholecystectomy; Cardiac catheterization (N/A, 05/08/2016); Abdominal hysterectomy; Eye surgery; Trigger finger release; Cataract extraction, bilateral; LEFT HEART CATH AND CORS/GRAFTS ANGIOGRAPHY (N/A, 02/22/2020); Colonoscopy with propofol  (N/A, 03/25/2020); Esophagogastroduodenoscopy (egd) with propofol  (N/A, 03/25/2020); Carpal tunnel release (Left, 06/06/2021); Ulnar nerve transposition (Left, 06/06/2021); and Cesarean section. Prior to Admission medications   Medication Sig Start Date End Date Taking? Authorizing Provider  acetaminophen -codeine  (TYLENOL  #3) 300-30 MG tablet Take 1 tablet by mouth 2 (two) times daily as needed for moderate pain (pain score 4-6). 05/01/24  Yes Lovorn, Megan, MD  albuterol  (VENTOLIN  HFA) 108 (90 Base) MCG/ACT inhaler Inhale 2 puffs into the lungs every 4 (four) hours as needed.   Yes [provider]  Albuterol -Budesonide  (AIRSUPRA ) 90-80 MCG/ACT AERO Inhale 2 puffs into the lungs every 6 (six) hours. 02/18/24  Yes [name] Dedra CROME, MD  Ascorbic Acid 500 MG CAPS Take by mouth. 10/21/13  Yes [provider]  aspirin  EC 81 MG tablet    Yes [provider]  Blood Glucose Monitoring Suppl (FREESTYLE FREEDOM LITE) w/Device KIT Use as directed by physician to check blood sugar 02/27/22  Yes   CHELATED MAGNESIUM PO Take 250 mg by mouth daily.   Yes [provider]  Cholecalciferol (VITAMIN D3) 125 MCG (5000 UT) CAPS Decrease to 1 tab every other day 12/11/23  Yes Opalski, Deborah, DO  clopidogrel  (PLAVIX ) 75 MG tablet TAKE 1 TABLET BY MOUTH ONCE DAILY 01/16/24  Yes Wonda Sharper, MD  Cyanocobalamin  (VITAMIN B-12 PO) Take by mouth.   Yes [provider]  cyclobenzaprine  (FLEXERIL ) 10 MG tablet Take 1 tablet (  10 mg total) by mouth at bedtime as needed for muscle spasms. 07/17/24  Yes  Deveshwar, Maya, MD  EPINEPHrine  0.3 mg/0.3 mL IJ SOAJ injection Inject 0.3 mg into the muscle as needed for anaphylaxis. 11/14/21  Yes McLean-Scocuzza, Randine SAILOR, MD  fluticasone  (FLONASE ) 50 MCG/ACT nasal spray Place 2 sprays into both nostrils daily. 11/27/23  Yes Wendee Lynwood HERO, NP  fluticasone -salmeterol (ADVAIR ) 100-50 MCG/ACT AEPB Inhale 1 puff into the lungs 2 (two) times daily. 02/13/24  Yes Wendee Lynwood HERO, NP  Glucagon  (GVOKE HYPOPEN  2-PACK) 0.5 MG/0.1ML SOAJ Inject 0.5 mg into the skin as needed (Hypoglycemic episode). 06/24/24  Yes Trixie File, MD  glucose blood (FREESTYLE LITE) test strip Use to check blood sugar 3 time(s) daily 09/26/23  Yes Trixie File, MD  glucose blood test strip    Yes [provider]  guaiFENesin -codeine  100-10 MG/5ML syrup Take 5 mLs by mouth at bedtime. 02/06/24  Yes Kaur, Charanpreet, NP  insulin  detemir (LEVEMIR ) 100 UNIT/ML injection Inject 36 units into the skin once daily in the event of insulin  pump failure. 07/09/22  Yes   insulin  lispro (HUMALOG ) 100 UNIT/ML injection Inject 0.9-1.2 mLs (90-120 Units total) into the skin daily. 05/11/24  Yes Trixie File, MD  Insulin  Syringe-Needle U-100 (BD INSULIN  SYRINGE U/F) 31G X 5/16 0.3 ML MISC use with insulin  injections 3 times daily in the event of insulin  pump failure. 11/22/21  Yes   isosorbide  mononitrate (IMDUR ) 60 MG 24 hr tablet Take 1 tablet (60 mg total) by mouth daily. 03/26/24  Yes Wonda Sharper, MD  Krill Oil 500 MG CAPS Take 500 mg by mouth daily.   Yes [provider]  Lancets (FREESTYLE) lancets Use to check blood sugar 3 time(s) daily. 02/27/22  Yes   leflunomide  (ARAVA ) 20 MG tablet Take 1 tablet (20 mg total) by mouth daily. 06/24/24  Yes Cheryl Waddell HERO, PA-C  linaclotide  (LINZESS ) 290 MCG CAPS capsule Take 1 capsule (290 mcg total) by mouth daily before breakfast. 08/19/23  Yes Therisa Bi, MD  losartan  (COZAAR ) 100 MG tablet Take 1 tablet (100 mg total) by mouth  daily. 04/14/24  Yes Wonda Sharper, MD  metoprolol  tartrate (LOPRESSOR ) 50 MG tablet Take 0.5 tablets (25 mg total) by mouth 2 (two) times daily. 03/02/24  Yes Wonda Sharper, MD  montelukast  (SINGULAIR ) 10 MG tablet Take 1 tablet (10 mg total) by mouth at bedtime. 02/26/24  Yes Wendee Lynwood HERO, NP  Multiple Vitamin (MULTIVITAMIN) capsule Take 1 capsule by mouth daily.   Yes [provider]  ondansetron  (ZOFRAN ) 4 MG tablet Take 1 tablet (4 mg total) by mouth every 8 (eight) hours as needed for nausea or vomiting. Patient taking differently: Take 4 mg by mouth as needed for nausea or vomiting. 02/10/24  Yes Lovorn, Megan, MD  pantoprazole  (PROTONIX ) 40 MG tablet Take 1 tablet (40 mg total) by mouth 2 (two) times daily. 01/08/24 01/07/25 Yes Therisa Bi, MD  potassium chloride  SA (KLOR-CON  M20) 20 MEQ tablet Take 1 tablet (20 mEq total) by mouth daily. 08/21/23  Yes Wonda Sharper, MD  rosuvastatin  (CRESTOR ) 20 MG tablet Take 1 tablet (20 mg total) by mouth daily. 03/13/24  Yes Wonda Sharper, MD  torsemide  (DEMADEX ) 20 MG tablet Take 1 tablet (20 mg total) by mouth daily. 03/02/24  Yes Wonda Sharper, MD  Turmeric (QC TUMERIC COMPLEX PO) Take 1,000 mg by mouth.   Yes [provider]  Upadacitinib  ER (RINVOQ ) 15 MG TB24 Take 1 tablet (15 mg) by mouth  daily. 05/29/24  Yes Deveshwar, Maya, MD  ipratropium (ATROVENT ) 0.06 % nasal spray Place 2 sprays into both nostrils 4 (four) times daily. Patient not taking: Reported on 07/28/2024 08/24/23   Bernardino Ditch, NP  neomycin -polymyxin b-dexamethasone  (MAXITROL ) 3.5-10000-0.1 SUSP Place 1 drop into both eyes 4 (four) times daily. Patient not taking: Reported on 07/28/2024 04/07/24      Allergies  Allergen Reactions   Actemra  [Tocilizumab ]    Ramipril Swelling, Rash and Other (See Comments)   Shellfish Protein-Containing Drug Products Swelling    Shrimp    Atorvastatin Rash    Elevated LFT's   Compazine  [Prochlorperazine Edisylate] Other (See  Comments)    Neurological reaction   Emgality  [Galcanezumab -Gnlm] Hives and Swelling   Etanercept Swelling and Rash   Infliximab Rash   Iohexol      Iv contrast dye -rash all over   Orencia [Abatacept] Rash   Prochlorperazine Edisylate     unknown   Tofacitinib Rash and Other (See Comments)    Severe abdominal pain    Trokendi  Xr [Topiramate  Er]     Brain fog, memory issues, word finding issues   Shrimp Extract Other (See Comments)   Topiramate  Other (See Comments)   Tramadol      Nausea    Amiodarone Nausea Only   Rituximab Rash    Causes a rash    FAMILY HISTORY:  family history includes Anxiety disorder in her mother; Bipolar disorder in her mother; Colon polyps in her father; Depression in her father, mother, and another family member; Diabetes in her father; Eating disorder in her mother; Heart disease in an other family member; Hyperlipidemia in her father, mother, and another family member; Hypertension in her father, mother, and another family member; Migraines in an other family member; Obesity in her father; Parkinson's disease in her father; Sleep apnea in her father and mother; Stroke in her paternal grandmother; Thyroid  disease in her mother. SOCIAL HISTORY:  reports that she has never smoked. She has never been exposed to tobacco smoke. She has never used smokeless tobacco. She reports current alcohol use. She reports that she does not use drugs.   Review of Systems:  Gen:  Denies  fever, sweats, chills weight loss  HEENT: Denies blurred vision, double vision, ear pain, eye pain, hearing loss, nose bleeds, sore throat Cardiac:  No dizziness, chest pain or heaviness, chest tightness,edema, No JVD Resp:   No cough, -sputum production, -shortness of breath,-wheezing, -hemoptysis,  Gi: Denies swallowing difficulty, stomach pain, nausea or vomiting, diarrhea, constipation, bowel incontinence Gu:  Denies bladder incontinence, burning urine Ext:   Denies Joint pain,  stiffness or swelling Skin: Denies  skin rash, easy bruising or bleeding or hives Endoc:  Denies polyuria, polydipsia , polyphagia or weight change Psych:   Denies depression, insomnia or hallucinations  Other:  All other systems negative  VITAL SIGNS: BP 122/70   Pulse 81   Temp 98.6 F (37 C)   Ht 5' 2 (1.575 m)   Wt 186 lb 12.8 oz (84.7 kg)   SpO2 97%   BMI 34.17 kg/m     Physical Examination:   General Appearance: No distress  EYES PERRLA, EOM intact.   NECK Supple, No JVD Pulmonary: normal breath sounds, No wheezing.  CardiovascularNormal S1,S2.  No m/r/g.   Abdomen: Benign, Soft, non-tender. Skin:   warm, no rashes, no ecchymosis  Extremities: normal, no cyanosis, clubbing. Neuro:without focal findings,  speech normal  PSYCHIATRIC: Mood, affect within normal limits.   ASSESSMENT  AND PLAN  OSA Patient is using and benefiting from CPAP therapy. Counseled patient on increasing CPAP compliance. Discussed the consequences of untreated sleep apnea. Advised not to drive drowsy for safety of patient and others. Will follow up in 3 months.     Obesity  Counseled patient on diet and lifestyle modification.    Patient  satisfied with Plan of action and management. All questions answered  I spent a total of 23 minutes reviewing chart data, face-to-face evaluation with the patient, counseling and coordination of care as detailed above.    Vere Diantonio, M.D.  Sleep Medicine  Pulmonary & Critical Care Medicine

## 2024-11-02 NOTE — Telephone Encounter (Signed)
 Pt scheduled to be seen today and bring in CPAP to attempt download.  Per [employer] with [employer]:  New, Adine Essex, Exie PARAS, CMA; Joylene Carlean Sheree Merilee Randie Exie,  I sent your message to the Branch at our [place] location.  Manager of that team is attached. I will try to keep you posted.  Thanks,  Arvella New       Previous Messages    ----- Message ----- From: [place] Exie PARAS, CMA Sent: 10/30/2024  12:09 PM EST To: Adine Randolm Merilee Sheree Subject: download or wyvonne Erskin Laundry,  For this patient, she typically has to go into one of your offices to get a download for compliance, and she has been trying to do so at both the [place] and [place] location, but she's been told it's difficult to access her data. Is there anywhere else she could try or someone to ask for directly? She has an upcoming appointment with Dr. Jess for Monday and is trying to gather everything ahead of then.  Thanks,  Z   NFN.

## 2024-11-03 ENCOUNTER — Other Ambulatory Visit: Payer: Self-pay

## 2024-11-04 DIAGNOSIS — G4733 Obstructive sleep apnea (adult) (pediatric): Secondary | ICD-10-CM | POA: Diagnosis not present

## 2024-11-12 ENCOUNTER — Ambulatory Visit (INDEPENDENT_AMBULATORY_CARE_PROVIDER_SITE_OTHER): Admitting: Family Medicine

## 2024-11-13 ENCOUNTER — Encounter: Payer: Self-pay | Admitting: Physical Medicine and Rehabilitation

## 2024-11-13 ENCOUNTER — Other Ambulatory Visit: Payer: Self-pay

## 2024-11-13 ENCOUNTER — Other Ambulatory Visit (HOSPITAL_COMMUNITY): Payer: Self-pay

## 2024-11-13 ENCOUNTER — Encounter: Attending: Physical Medicine and Rehabilitation | Admitting: Physical Medicine and Rehabilitation

## 2024-11-13 VITALS — BP 144/76 | HR 93 | Ht 62.0 in | Wt 185.2 lb

## 2024-11-13 DIAGNOSIS — G43E19 Chronic migraine with aura, intractable, without status migrainosus: Secondary | ICD-10-CM | POA: Diagnosis not present

## 2024-11-13 DIAGNOSIS — G8929 Other chronic pain: Secondary | ICD-10-CM | POA: Insufficient documentation

## 2024-11-13 DIAGNOSIS — M0609 Rheumatoid arthritis without rheumatoid factor, multiple sites: Secondary | ICD-10-CM | POA: Diagnosis not present

## 2024-11-13 DIAGNOSIS — M7918 Myalgia, other site: Secondary | ICD-10-CM | POA: Diagnosis not present

## 2024-11-13 DIAGNOSIS — M549 Dorsalgia, unspecified: Secondary | ICD-10-CM | POA: Diagnosis not present

## 2024-11-13 MED ORDER — ONDANSETRON HCL 4 MG PO TABS
4.0000 mg | ORAL_TABLET | Freq: Three times a day (TID) | ORAL | 5 refills | Status: AC | PRN
  Filled 2024-11-13: qty 90, 30d supply, fill #0

## 2024-11-13 MED ORDER — CYPROHEPTADINE HCL 4 MG PO TABS
4.0000 mg | ORAL_TABLET | Freq: Two times a day (BID) | ORAL | 5 refills | Status: AC | PRN
  Filled 2024-11-13: qty 60, 30d supply, fill #0

## 2024-11-13 NOTE — Progress Notes (Signed)
 Subjective:    Patient ID: [name], female    DOB: 1966/05/12, 58 y.o.   MRN: [id number]  HPI Pt is a 58 y.o. female with hx of RA, (-) rh factor, thoracic DDD, DM1 with retinopathy- Ac 6.5, migraines without aura, and MI/CABG in past-  Also has Graves disease; and just dx'd with tendinitis in B/L hamstring tendinosis- Here for f/u of chronic pain                Too much money to get trp Injections- $300.   Lot of HA's-  And back pain HA's comes and goes- could be new Osteoporosis- a shot every night for 2 years. Started 2 weeks ago - Forteo.  Started with daily HA's 1 month ago.  2-3 pm.   Frontal HA- B/L  Light sensitive- no sounds sensitivity Has associated Nausea; no vomiting  Hx of migraines- but getting daily.  Zofran  is sometimes helpful- sometimes gets constipation from Zofran .  Phenergan  works, but puts her to sleep   Took Tylenol  # 3 the other day- was helpful to go to sleep- hard to know if made her go to sleep or if helped HA.   For rescue- Imitrex; Nurtec- not really-  Also tried Ubrelvy  and didn't help much at all.  Allergic to imitrex shot- significant swelling  Topamax - caused bad fog Elavil made her sleepy in AM's. Couldn't function well.   Low back has been bothering her more- Walking has to rest  Had PRP in back 2x/- and does help- last time was 5 years ago since last time used it.  Hasn't gotten PRP in ~ 5 years.    Pain Inventory Average Pain 6 Pain Right Now 6 My pain is nagging  In the last 24 hours, has pain interfered with the following? General activity 2 Relation with others 0 Enjoyment of life 2 What TIME of day is your pain at its worst? varies Sleep (in general) Poor  Pain is worse with: walking Pain improves with: rest Relief from Meds: 3  Family History  Problem Relation Age of Onset   Depression Mother    Anxiety disorder Mother    Hypertension Mother    Hyperlipidemia Mother    Thyroid  disease Mother     Bipolar disorder Mother    Sleep apnea Mother    Eating disorder Mother    Colon polyps Father    Diabetes Father        2   Hypertension Father    Parkinson's disease Father    Hyperlipidemia Father    Depression Father    Sleep apnea Father    Obesity Father    Stroke Paternal Grandmother    Heart disease Other    Hypertension Other    Hyperlipidemia Other    Depression Other    Migraines Other    Heart attack Neg Hx    Colon cancer Neg Hx    Stomach cancer Neg Hx    Esophageal cancer Neg Hx    Social History   Socioeconomic History   Marital status: Divorced    Spouse name: Not on file   Number of children: Not on file   Years of education: Not on file   Highest education level: Not on file  Occupational History   Not on file  Tobacco Use   Smoking status: Never    Passive exposure: Never   Smokeless tobacco: Never  Vaping Use   Vaping status: Never Used  Substance and Sexual Activity   Alcohol use: Yes    Comment: rarely 1 every 6 months   Drug use: No   Sexual activity: Not Currently    Partners: Male    Birth control/protection: None  Other Topics Concern   Not on file  Social History Narrative   Divorced. Has 2 kids(fraternal twins) daughters age 55. Works at  [employer], Never smoked, denies ETOH, no drugs. Drinks diet coke. No exercise.       DPR daughters Eleanor and Kalia Vahey twins          Social Drivers of Health   Financial Resource Strain: Not on file  Food Insecurity: No Food Insecurity (05/22/2022)   [facility]   Hunger Vital Sign    Within the past 12 months, you worried that your food would run out before you got the money to buy more.: Never true    Within the past 12 months, the food you bought just didn't last and you didn't have money to get more.: Never true  Transportation Needs: Not on file  Physical Activity: Not on file  Stress: Not on file  Social Connections: Unknown (05/09/2022)   [facility]   Social Network    Social Network: Not on file   Past Surgical History:  Procedure Laterality Date   ABDOMINAL HYSTERECTOMY     endometriomas b/l; total ? cervix removed; no h/o abnormal pap   Caesarean section     CARDIAC CATHETERIZATION N/A 05/08/2016   Procedure: Left Heart Cath and Cors/Grafts Angiography;  Surgeon: Lonni JONETTA Cash, MD;  Location: [facility] INVASIVE CV LAB;  Service: Cardiovascular;  Laterality: N/A;   CARPAL TUNNEL RELEASE     b/l    CARPAL TUNNEL RELEASE Left 06/06/2021   Procedure: CARPAL TUNNEL RELEASE;  Surgeon: Murrell Kuba, MD;  Location: [facility];  Service: Orthopedics;  Laterality: Left;  AXILLARY BLOCK   CATARACT EXTRACTION, BILATERAL     CESAREAN SECTION     CHOLECYSTECTOMY     COLONOSCOPY WITH PROPOFOL  N/A 03/25/2020   Procedure: COLONOSCOPY WITH PROPOFOL ;  Surgeon: Therisa Bi, MD;  Location: [facility] ENDOSCOPY;  Service: Gastroenterology;  Laterality: N/A;   CORONARY ARTERY BYPASS GRAFT     ESOPHAGOGASTRODUODENOSCOPY (EGD) WITH PROPOFOL  N/A 03/25/2020   Procedure: ESOPHAGOGASTRODUODENOSCOPY (EGD) WITH PROPOFOL ;  Surgeon: Therisa Bi, MD;  Location: [facility] ENDOSCOPY;  Service: Gastroenterology;  Laterality: N/A;   EYE SURGERY     laser x 2 for retinopathy    LEFT HEART CATH AND CORS/GRAFTS ANGIOGRAPHY N/A 02/22/2020   Procedure: LEFT HEART CATH AND CORS/GRAFTS ANGIOGRAPHY;  Surgeon: Cash Lonni JONETTA, MD;  Location: [facility] INVASIVE CV LAB;  Service: Cardiovascular;  Laterality: N/A;   TRIGGER FINGER RELEASE     b/l fingers all    ULNAR NERVE TRANSPOSITION Left 06/06/2021   Procedure: ULNAR NERVE DECOMPRESSION;  Surgeon: Murrell Kuba, MD;  Location: [facility];  Service: Orthopedics;  Laterality: Left;  AXILLARY BLOCK   VITRECTOMY     b/l    Past Surgical History:  Procedure Laterality Date   ABDOMINAL HYSTERECTOMY     endometriomas b/l; total ? cervix removed; no h/o abnormal pap   Caesarean section      CARDIAC CATHETERIZATION N/A 05/08/2016   Procedure: Left Heart Cath and Cors/Grafts Angiography;  Surgeon: Lonni JONETTA Cash, MD;  Location: [facility] INVASIVE CV LAB;  Service: Cardiovascular;  Laterality: N/A;   CARPAL TUNNEL RELEASE     b/l  CARPAL TUNNEL RELEASE Left 06/06/2021   Procedure: CARPAL TUNNEL RELEASE;  Surgeon: Murrell Kuba, MD;  Location: [facility];  Service: Orthopedics;  Laterality: Left;  AXILLARY BLOCK   CATARACT EXTRACTION, BILATERAL     CESAREAN SECTION     CHOLECYSTECTOMY     COLONOSCOPY WITH PROPOFOL  N/A 03/25/2020   Procedure: COLONOSCOPY WITH PROPOFOL ;  Surgeon: Therisa Bi, MD;  Location: [facility] ENDOSCOPY;  Service: Gastroenterology;  Laterality: N/A;   CORONARY ARTERY BYPASS GRAFT     ESOPHAGOGASTRODUODENOSCOPY (EGD) WITH PROPOFOL  N/A 03/25/2020   Procedure: ESOPHAGOGASTRODUODENOSCOPY (EGD) WITH PROPOFOL ;  Surgeon: Therisa Bi, MD;  Location: [facility] ENDOSCOPY;  Service: Gastroenterology;  Laterality: N/A;   EYE SURGERY     laser x 2 for retinopathy    LEFT HEART CATH AND CORS/GRAFTS ANGIOGRAPHY N/A 02/22/2020   Procedure: LEFT HEART CATH AND CORS/GRAFTS ANGIOGRAPHY;  Surgeon: Verlin Lonni BIRCH, MD;  Location: [facility] INVASIVE CV LAB;  Service: Cardiovascular;  Laterality: N/A;   TRIGGER FINGER RELEASE     b/l fingers all    ULNAR NERVE TRANSPOSITION Left 06/06/2021   Procedure: ULNAR NERVE DECOMPRESSION;  Surgeon: Murrell Kuba, MD;  Location: [facility];  Service: Orthopedics;  Laterality: Left;  AXILLARY BLOCK   VITRECTOMY     b/l    Past Medical History:  Diagnosis Date   ACUT MI ANTEROLAT WALL SUBSQT EPIS CARE    Acute maxillary sinusitis    ALLERGIC RHINITIS, SEASONAL    Allergy    Anemia    Arthritis    ARTHRITIS, RHEUMATOID    shoulders and hands Enbrel>leg swelling Dr. Dolphus   Atrial fibrillation [facility])    a. after CABG.   Back pain    Blood transfusion without reported diagnosis    Bruxism (teeth grinding)     CAD, ARTERY BYPASS GRAFT    a. DES to RCA in 2010 then LAD occlusion s/p CABG 3 06/07/2009 with LIMA to LAD, reverse SVG to D1, reverse SVG to distal RCA. b. Cath 05/08/2016 slightly hypodense region in the intermediate branch, however she had excellent flow, FFR was normal. Vein graft to PDA and the posterolateral branch is patent, patent LIMA to LAD, occluded SVG to diagonal.   CARPAL TUNNEL SYNDROME, BILATERAL    Cataract    CHOLELITHIASIS    Clotting disorder    Contrast media allergy    DERMATITIS, ALLERGIC    DIABETES MELLITUS, TYPE I    on insulin  pump dx'ed age 20 y.o    DIABETIC  RETINOPATHY    GERD (gastroesophageal reflux disease)    Graves' disease    Heart attack (HCC)    Hiatal hernia    HYPERLIPIDEMIA-MIXED    Hypertension    HYPERTHYROIDISM    Dr. Hosie Balder   IBS (irritable bowel syndrome)    IDA (iron  deficiency anemia)    Infertility, female    Insulin  pump in place    Lymphadenopathy of head and neck    MIGRAINE W/O AURA W/INTRACT W/STATUS MIGRAINOSUS 02/19/2008   PONV (postoperative nausea and vomiting)    Psoriasis    Rheumatoid arthritis (HCC)    SINUS TACHYCARDIA 11/08/2010   Sleep apnea    not using machine yet    Stomach ulcer    SVT (supraventricular tachycardia)    after s/p CABG   Tendonitis    TRIGGER FINGER    all fingers b/l hands    URI    Ht 5' 2 (1.575 m)   BMI 34.17 kg/m  Opioid Risk Score:   Fall Risk Score:  `1  Depression screen [facility] 2/9     11/13/2024    3:41 PM 09/18/2024    3:25 PM 08/17/2024    3:21 PM 06/12/2024    2:31 PM 05/01/2024    3:25 PM 02/06/2024    3:31 PM 12/12/2023    3:37 PM  Depression screen PHQ 2/9  Decreased Interest 0 0 0 0 0 1 0  Down, Depressed, Hopeless 0 0 0 0 0 0 0  PHQ - 2 Score 0 0 0 0 0 1 0  Altered sleeping      2 2  Tired, decreased energy      2 2  Change in appetite      0 0  Feeling bad or failure about yourself       0 0  Trouble concentrating      0 0  Moving slowly or  fidgety/restless      0 0  Suicidal thoughts      0 0  PHQ-9 Score      5  4   Difficult doing work/chores      Somewhat difficult Somewhat difficult     Data saved with a previous flowsheet row definition     Review of Systems  Musculoskeletal:  Positive for back pain.  All other systems reviewed and are negative.      Objective:   Physical Exam   Awake, alert, appropriate R eye dilated mildly compared to L eye     Assessment & Plan:   Pt is a 58 yr old female with hx of RA, (-) rh factor, thoracic DDD, DM1 with retinopathy- Ac 6.5, migraines without aura, and MI/CABG in past-  Also has Graves disease; and just dx'd with tendinitis in B/L hamstring tendinosis- Here for f/u of chronic pain and trigger point injections .                Take Tylenol  #3 when gets really bad HA's or pain.  Of note, still has 3/4 of bottle in spite of Rx 08/17/24.  When needs refill of tylenol  #3, just call me, let me know.   2. Will renew Zofran   4 mg up to 3x/day- as needed for nausea associated with migraine    3. Never tried Cyproheptadine/Periactin- 4 mg up to 2x/day as needed for migraine/associated migraine symptoms-  Will try it- can make you hungry.   4.  Levators and middle scalenes- are the 2 most common muscles that cause migraines. So work on those with theracane.    5.  Last drug screen 3 months ago- not due today. Per clinc policy.   6. F/U - 4 months since not doing trp Injections.    I spent a total of 20   minutes on total care today- >50% coordination of care- due to d/w pt about migraines, gave zofran , d/w her about different migraine prevention- on allergy list for a lot of them, or didn't work for prevention or rescue- added Periactin

## 2024-11-13 NOTE — Patient Instructions (Signed)
 Pt is a 57 yr old female with hx of RA, (-) rh factor, thoracic DDD, DM1 with retinopathy- Ac 6.5, migraines without aura, and MI/CABG in past-  Also has Graves disease; and just dx'd with tendinitis in B/L hamstring tendinosis- Here for f/u of chronic pain and trigger point injections .                Take Tylenol  #3 when gets really bad HA's or pain.  Of note, still has 3/4 of bottle in spite of Rx 08/17/24.  When needs refill of tylenol  #3, just call me, let me know.   2. Will renew Zofran   4 mg up to 3x/day- as needed for nausea associated with migraine    3. Never tried Cyproheptadine/Periactin- 4 mg up to 2x/day as needed for migraine/associated migraine symptoms-  Will try it- can make you hungry.   4.  Levators and middle scalenes- are the 2 most common muscles that cause migraines. So work on those with theracane.    5.  Last drug screen 3 months ago- not due today. Per clinc policy.   6. F/U - 4 months since not doing trp Injections.

## 2024-11-19 ENCOUNTER — Other Ambulatory Visit: Payer: Self-pay

## 2024-11-20 ENCOUNTER — Other Ambulatory Visit: Payer: Self-pay

## 2024-11-23 ENCOUNTER — Other Ambulatory Visit: Payer: Self-pay

## 2024-11-23 NOTE — Progress Notes (Signed)
 Specialty Pharmacy Refill Coordination Note  [name] is a 58 y.o. female contacted today regarding refills of specialty medication(s) Teriparatide    Patient requested Delivery   Delivery date: 11/25/24   Verified address: [address] [place] [address]   Medication will be filled on: 11/24/24

## 2024-11-24 ENCOUNTER — Ambulatory Visit: Admitting: Cardiovascular Disease

## 2024-11-25 ENCOUNTER — Other Ambulatory Visit: Payer: Self-pay

## 2024-11-25 ENCOUNTER — Ambulatory Visit

## 2024-11-25 ENCOUNTER — Other Ambulatory Visit: Payer: Self-pay | Admitting: Rheumatology

## 2024-11-25 DIAGNOSIS — L814 Other melanin hyperpigmentation: Secondary | ICD-10-CM

## 2024-11-25 DIAGNOSIS — D235 Other benign neoplasm of skin of trunk: Secondary | ICD-10-CM | POA: Diagnosis not present

## 2024-11-25 DIAGNOSIS — L578 Other skin changes due to chronic exposure to nonionizing radiation: Secondary | ICD-10-CM | POA: Diagnosis not present

## 2024-11-25 DIAGNOSIS — D485 Neoplasm of uncertain behavior of skin: Secondary | ICD-10-CM | POA: Diagnosis not present

## 2024-11-25 DIAGNOSIS — D1801 Hemangioma of skin and subcutaneous tissue: Secondary | ICD-10-CM

## 2024-11-25 DIAGNOSIS — Z9225 Personal history of immunosupression therapy: Secondary | ICD-10-CM | POA: Diagnosis not present

## 2024-11-25 DIAGNOSIS — M0609 Rheumatoid arthritis without rheumatoid factor, multiple sites: Secondary | ICD-10-CM

## 2024-11-25 DIAGNOSIS — D229 Melanocytic nevi, unspecified: Secondary | ICD-10-CM

## 2024-11-25 DIAGNOSIS — L853 Xerosis cutis: Secondary | ICD-10-CM

## 2024-11-25 DIAGNOSIS — W908XXA Exposure to other nonionizing radiation, initial encounter: Secondary | ICD-10-CM

## 2024-11-25 DIAGNOSIS — L821 Other seborrheic keratosis: Secondary | ICD-10-CM | POA: Diagnosis not present

## 2024-11-25 DIAGNOSIS — Z1283 Encounter for screening for malignant neoplasm of skin: Secondary | ICD-10-CM

## 2024-11-25 NOTE — Telephone Encounter (Signed)
 Last Fill: 08/21/2024  Labs: 10/14/2024 CMP normal 09/18/2024 CBC WNL   TB Gold: 10/14/2024 negative    Next Visit: 03/16/2025  Last Visit: 10/14/2024  IK:Myzlfjunpi arthritis of multiple sites with negative rheumatoid factor   Current Dose per office note on 10/14/2024: Rinvoq  15 mg 1 tablet by mouth once daily   Okay to refill Rinvoq ?

## 2024-11-25 NOTE — Progress Notes (Signed)
 Subjective   [name] is a 58 y.o. female who presents for the following: Total body skin exam for skin cancer screening and mole check. The patient has spots, moles and lesions to be evaluated, some may be new or changing and the patient may have concern these could be cancer.. Patient is established patient   Today patient reports: TBSE - takes biologic medications, rheumatologist recommended annual skin exam.   Review of Systems:    No other skin or systemic complaints except as noted in HPI or Assessment and Plan.  The following portions of the chart were reviewed this encounter and updated as appropriate: medications, allergies, medical history  Relevant Medical History:  Rheumatoid arthritis, diabetes   Objective  (SKPE) Well appearing patient in no apparent distress; mood and affect are within normal limits. Examination was performed of the: Full Skin Examination: scalp, head, eyes, ears, nose, lips, neck, chest, axillae, abdomen, back, buttocks, bilateral upper extremities, bilateral lower extremities, hands, feet, fingers, toes, fingernails, and toenails.   Examination notable for: SKIN EXAM, Angioma(s): Scattered red vascular papule(s)  , Lentigo/lentigines: Scattered pigmented macules that are tan to brown in color and are somewhat non-uniform in shape and concentrated in the sun-exposed areas, Nevus/nevi: Scattered well-demarcated, regular, pigmented macule(s) and/or papule(s)  , Seborrheic Keratosis(es): Stuck-on appearing keratotic papule(s) on the trunk, none  irritated with redness, crusting, edema, and/or partial avulsion, Actinic Damage/Elastosis: chronic sun damage: dyspigmentation, telangiectasia, and wrinkling  Examination limited by: n/a   L upper back 0.7 cm Irregular brown pink macule.   Assessment & Plan  (SKAP)   SKIN CANCER SCREENING PERFORMED TODAY.  BENIGN SKIN FINDINGS  - Lentigines  - Seborrheic keratoses  - Hemangiomas   - Nevus/Multiple  Benign Nevi  - Reassurance provided regarding the benign appearance of lesions noted on exam today; no treatment is indicated in the absence of symptoms/changes. - Reinforced importance of photoprotective strategies including liberal and frequent sunscreen use of a broad-spectrum SPF 30 or greater, use of protective clothing, and sun avoidance for prevention of cutaneous malignancy and photoaging.  Counseled patient on the importance of regular self-skin monitoring as well as routine clinical skin examinations as scheduled.   ACTINIC DAMAGE - Chronic condition, secondary to cumulative UV/sun exposure - Recommend daily broad spectrum sunscreen SPF 30+ to sun-exposed areas, reapply every 2 hours as needed.  - Staying in the shade or wearing long sleeves, sun glasses (UVA+UVB protection) and wide brim hats (4-inch brim around the entire circumference of the hat) are also recommended for sun protection.  - Call for new or changing lesions.  Personal history of immunosuppression - Reviewed medical history for full details  - Reviewed sun protective measures as above - Encouraged full body skin exams     Xerosis cutis - Discussed diagnosis, typical course, and treatment options for this condition - The patient was advised to use a gentle cleanser such as Dove or Cetaphil and to avoid anti-bacterial soaps which may be too irritating. We recommended the frequent use of a greasy emollient such as Cetaphil, Cerave or Vaseline to moisturize the skin once to twice daily. We also recommended the avoidance of scratching and irritants to the skin.  Level of service outlined above   Patient instructions (SKPI)   Procedures, orders, diagnosis for this visit:  NEOPLASM OF UNCERTAIN BEHAVIOR OF SKIN L upper back Epidermal / dermal shaving  Lesion diameter (cm):  0.7 Informed consent: discussed and consent obtained   Timeout: patient name,  date of birth, surgical site, and procedure verified   Instrument  used: DermaBlade   Hemostasis achieved with: pressure and aluminum chloride   Outcome: patient tolerated procedure well   Post-procedure details: sterile dressing applied and wound care instructions given   Dressing type: petrolatum and bandage    Specimen 1 - Surgical pathology Differential Diagnosis: D48.5 recurrent nevus vs MM vs dysplastic nevus Check Margins: Yes LENTIGO   SEBORRHEIC KERATOSIS   MULTIPLE BENIGN NEVI   CHERRY ANGIOMA   ACTINIC SKIN DAMAGE   XEROSIS CUTIS    Neoplasm of uncertain behavior of skin -     Epidermal / dermal shaving -     Surgical pathology; Standing  Lentigo  Seborrheic keratosis  Multiple benign nevi  Cherry angioma  Actinic skin damage  Xerosis cutis    Return to clinic: Return in about 1 year (around 11/25/2025) for TBSE.  LILLETTE Rosina Mayans, CMA, am acting as scribe for Lauraine JAYSON Kanaris, MD .   Documentation: I have reviewed the above documentation for accuracy and completeness, and I agree with the above.  Lauraine JAYSON Kanaris, MD

## 2024-11-25 NOTE — Patient Instructions (Addendum)
 Wound Care Instructions  Cleanse wound gently with soap and water once a day then pat dry with clean gauze. Apply a thin coat of Petrolatum (petroleum jelly, Vaseline) over the wound (unless you have an allergy to this). We recommend that you use a new, sterile tube of Vaseline. Do not pick or remove scabs. Do not remove the yellow or white healing tissue from the base of the wound.  Cover the wound with fresh, clean, nonstick gauze and secure with paper tape. You may use Band-Aids in place of gauze and tape if the wound is small enough, but would recommend trimming much of the tape off as there is often too much. Sometimes Band-Aids can irritate the skin.  You should call the office for your biopsy report after 1 week if you have not already been contacted.  If you experience any problems, such as abnormal amounts of bleeding, swelling, significant bruising, significant pain, or evidence of infection, please call the office immediately.  FOR ADULT SURGERY PATIENTS: If you need something for pain relief you may take 1 extra strength Tylenol  (acetaminophen ) AND 2 Ibuprofen (200mg  each) together every 4 hours as needed for pain. (do not take these if you are allergic to them or if you have a reason you should not take them.) Typically, you may only need pain medication for 1 to 3 days.      Dry Skin Care  What causes dry skin?  Dry skin is common and results from inadequate moisture in the outer skin layers. Dry skin usually results from the excessive loss of moisture from the skin surface. This occurs due to two major factors: Normally the skin's oil glands deposit a layer of oil on the skin's surface. This layer of oil prevents the loss of moisture from the skin. Exposure to soaps, cleaners, solvents, and disinfectants removes this oily film, allowing water to escape. Water loss from the skin increases when the humidity is low. During winter months we spend a lot of time indoors where the  air is heated. Heated air has very low humidity. This also contributes to dry skin.  A tendency for dry skin may accompany such disorders as eczema. Also, as people age, the number of functioning oil glands decreases, and the tendency toward dry skin can be a sensation of skin tightness when emerging from the shower.  How do I manage dry skin?  Humidify your environment. This can be accomplished by using a humidifier in your bedroom at night during winter months. Bathing can actually put moisture back into your skin if done right. Take the following steps while bathing to sooth dry skin: Avoid hot water, which only dries the skin and makes itching worse. Use warm water. Avoid washcloths or extensive rubbing or scrubbing. Use mild soaps like unscented Dove, Oil of Olay, Cetaphil, Basis, or CeraVe. If you take baths rather than showers, rinse off soap residue with clean water before getting out of tub. Once out of the shower/tub, pat dry gently with a soft towel. Leave your skin damp. While still damp, apply any medicated ointment/cream you were prescribed to the affected areas. After you apply your medicated ointment/cream, then apply your moisturizer to your whole body.This is the most important step in dry skin care. If this is omitted, your skin will continue to be dry. The choice of moisturizer is also very important. In general, lotion will not provider enough moisture to severely dry skin because it is water based. You should use an  ointment or cream. Moisturizers should also be unscented. Good choices include Vaseline (plain petrolatum), Aquaphor, Cetaphil, CeraVe, Vanicream, DML Forte, Aveeno moisture, or Eucerin Cream. Bath oils can be helpful, but do not replace the application of moisturizer after the bath. In addition, they make the tub slippery causing an increased risk for falls. Therefore, we do not recommend their use.  Due to recent changes in healthcare laws, you may see results of  your pathology and/or laboratory studies on MyChart before the doctors have had a chance to review them. We understand that in some cases there may be results that are confusing or concerning to you. Please understand that not all results are received at the same time and often the doctors may need to interpret multiple results in order to provide you with the best plan of care or course of treatment. Therefore, we ask that you please give us  2 business days to thoroughly review all your results before contacting the office for clarification. Should we see a critical lab result, you will be contacted sooner.   If You Need Anything After Your Visit  If you have any questions or concerns for your doctor, please call our main line at [phone] and press option 4 to reach your doctor's medical assistant. If no one answers, please leave a voicemail as directed and we will return your call as soon as possible. Messages left after 4 pm will be answered the following business day.   You may also send us  a message via MyChart. We typically respond to MyChart messages within 1-2 business days.  For prescription refills, please ask your pharmacy to contact our office. Our fax number is [phone].  If you have an urgent issue when the clinic is closed that cannot wait until the next business day, you can page your doctor at the number below.    Please note that while we do our best to be available for urgent issues outside of office hours, we are not available 24/7.   If you have an urgent issue and are unable to reach us , you may choose to seek medical care at your doctor's office, retail clinic, urgent care center, or emergency room.  If you have a medical emergency, please immediately call 911 or go to the emergency department.  Pager Numbers  - Dr. Hester: [phone]  - Dr. Jackquline: [phone]  - Dr. Claudene: [phone]   - Dr. Raymund: [phone]  In the event of inclement weather,  please call our main line at [phone] for an update on the status of any delays or closures.  Dermatology Medication Tips: Please keep the boxes that topical medications come in in order to help keep track of the instructions about where and how to use these. Pharmacies typically print the medication instructions only on the boxes and not directly on the medication tubes.   If your medication is too expensive, please contact our office at [phone] option 4 or send us  a message through MyChart.   We are unable to tell what your co-pay for medications will be in advance as this is different depending on your insurance coverage. However, we may be able to find a substitute medication at lower cost or fill out paperwork to get insurance to cover a needed medication.   If a prior authorization is required to get your medication covered by your insurance company, please allow us  1-2 business days to complete this process.  Drug prices often vary depending on where the prescription is  filled and some pharmacies may offer cheaper prices.  The website www.goodrx.com contains coupons for medications through different pharmacies. The prices here do not account for what the cost may be with help from insurance (it may be cheaper with your insurance), but the website can give you the price if you did not use any insurance.  - You can print the associated coupon and take it with your prescription to the pharmacy.  - You may also stop by our office during regular business hours and pick up a GoodRx coupon card.  - If you need your prescription sent electronically to a different pharmacy, notify our office through [facility] or by phone at [phone] option 4.     Si Usted Necesita Algo Despus de Su Visita  Tambin puede enviarnos un mensaje a travs de Clinical Cytogeneticist. Por lo general respondemos a los mensajes de MyChart en el transcurso de 1 a 2 das hbiles.  Para renovar recetas, por favor  pida a su farmacia que se ponga en contacto con nuestra oficina. Randi lakes de fax es [place] [phone].  Si tiene un asunto urgente cuando la clnica est cerrada y que no puede esperar hasta el siguiente da hbil, puede llamar/localizar a su doctor(a) al nmero que aparece a continuacin.   Por favor, tenga en cuenta que aunque hacemos todo lo posible para estar disponibles para asuntos urgentes fuera del horario de [place], no estamos disponibles las 24 horas del da, los 7 [address] de la [place].   Si tiene un problema urgente y no puede comunicarse con nosotros, puede optar por buscar atencin mdica  en el consultorio de su doctor(a), en una clnica privada, en un centro de atencin urgente o en una sala de emergencias.  Si tiene engineer, drilling, por favor llame inmediatamente al 911 o vaya a la sala de emergencias.  Nmeros de bper  - Dr. Hester: [phone]  - Dra. Jackquline: [phone]  - Dr. Claudene: [phone]  - Dra. Kitts: [phone]  En caso de inclemencias del [place], por favor llame a nuestra lnea principal al [phone] para una actualizacin sobre el estado de cualquier retraso o cierre.  Consejos para la medicacin en dermatologa: Por favor, guarde las cajas en las que vienen los medicamentos de uso tpico para ayudarle a seguir las instrucciones sobre dnde y cmo usarlos. Las farmacias generalmente imprimen las instrucciones del medicamento slo en las cajas y no directamente en los tubos del [place].   Si su medicamento es muy caro, por favor, pngase en contacto con landry rieger llamando al [phone] y presione la opcin 4 o envenos un mensaje a travs de Clinical Cytogeneticist.   No podemos decirle cul ser su copago por los medicamentos por adelantado ya que esto es diferente dependiendo de la cobertura de su seguro. Sin embargo, es posible que podamos encontrar un medicamento sustituto a audiological scientist un formulario para que el seguro cubra el  medicamento que se considera necesario.   Si se requiere una autorizacin previa para que su compaa de seguros cubra su medicamento, por favor permtanos de 1 a 2 das hbiles para completar este proceso.  Los precios de los medicamentos varan con frecuencia dependiendo del environmental consultant de dnde se surte la receta y alguna farmacias pueden ofrecer precios ms baratos.  El sitio web www.goodrx.com tiene cupones para medicamentos de health and safety inspector. Los precios aqu no tienen en cuenta lo que podra costar con la ayuda del seguro (puede ser ms barato con su seguro), [employer]  el sitio web puede darle el precio si no visual merchandiser.  - Puede imprimir el cupn correspondiente y llevarlo con su receta a la farmacia.  - Tambin puede pasar por nuestra oficina durante el horario de atencin regular y education officer, museum una tarjeta de cupones de GoodRx.  - Si necesita que su receta se enve electrnicamente a una farmacia diferente, informe a nuestra oficina a travs de MyChart de [place] o por telfono llamando al [phone] y presione la opcin 4.

## 2024-11-28 MED ORDER — RINVOQ 15 MG PO TB24
15.0000 mg | ORAL_TABLET | Freq: Every day | ORAL | 0 refills | Status: AC
  Filled 2024-11-28: qty 90, 90d supply, fill #0
  Filled 2024-11-30: qty 30, 30d supply, fill #0
  Filled 2025-01-04: qty 30, 30d supply, fill #1
  Filled 2025-01-29: qty 30, 30d supply, fill #2

## 2024-11-30 ENCOUNTER — Other Ambulatory Visit (HOSPITAL_COMMUNITY): Payer: Self-pay

## 2024-11-30 ENCOUNTER — Other Ambulatory Visit: Payer: Self-pay

## 2024-11-30 NOTE — Progress Notes (Signed)
 Specialty Pharmacy Refill Coordination Note  [name] is a 58 y.o. female contacted today regarding refills of specialty medication(s) Upadacitinib  (Rinvoq )   Patient requested Delivery   Delivery date: 12/08/24   Verified address: [address] [place] [address]   Medication will be filled on: 12/07/24

## 2024-12-01 ENCOUNTER — Other Ambulatory Visit: Payer: Self-pay

## 2024-12-01 ENCOUNTER — Other Ambulatory Visit (HOSPITAL_COMMUNITY): Payer: Self-pay

## 2024-12-07 ENCOUNTER — Other Ambulatory Visit (HOSPITAL_COMMUNITY): Payer: Self-pay

## 2024-12-07 ENCOUNTER — Other Ambulatory Visit: Payer: Self-pay

## 2024-12-07 ENCOUNTER — Ambulatory Visit (INDEPENDENT_AMBULATORY_CARE_PROVIDER_SITE_OTHER): Payer: Self-pay | Admitting: Family Medicine

## 2024-12-07 LAB — DERMATOLOGY PATHOLOGY

## 2024-12-08 ENCOUNTER — Ambulatory Visit: Payer: Self-pay

## 2024-12-08 NOTE — Telephone Encounter (Signed)
-----   Message from Lauraine JAYSON Kanaris sent at 12/08/2024 10:16 AM EST -----    1. Skin, left upper back :       CONSISTENT WITH DERMATOFIBROMA, BASE INVOLVED, SHAVE SPECIMEN   Please notify patient with below plan: Benign, observe.    ----- Message ----- From: Interface, Lab In [place] Sent: 12/07/2024   5:20 PM EST To: Lauraine JAYSON Kanaris, MD

## 2024-12-08 NOTE — Telephone Encounter (Signed)
 Left pt message to call for by results./sh

## 2024-12-08 NOTE — Telephone Encounter (Addendum)
 Patient returned call. Discussed bx results with patient. Verbalized understanding and denied further questions.    ----- Message from Lauraine JAYSON Kanaris sent at 12/08/2024 10:16 AM EST -----    1. Skin, left upper back :       CONSISTENT WITH DERMATOFIBROMA, BASE INVOLVED, SHAVE SPECIMEN   Please notify patient with below plan: Benign, observe.    ----- Message ----- From: Interface, Lab In [place] Sent: 12/07/2024   5:20 PM EST To: Lauraine JAYSON Kanaris, MD

## 2024-12-10 ENCOUNTER — Other Ambulatory Visit

## 2024-12-10 ENCOUNTER — Encounter: Payer: Self-pay | Admitting: Internal Medicine

## 2024-12-10 ENCOUNTER — Ambulatory Visit: Admitting: Internal Medicine

## 2024-12-10 DIAGNOSIS — E7849 Other hyperlipidemia: Secondary | ICD-10-CM

## 2024-12-10 DIAGNOSIS — E1059 Type 1 diabetes mellitus with other circulatory complications: Secondary | ICD-10-CM

## 2024-12-10 DIAGNOSIS — E66811 Obesity, class 1: Secondary | ICD-10-CM

## 2024-12-10 DIAGNOSIS — E0500 Thyrotoxicosis with diffuse goiter without thyrotoxic crisis or storm: Secondary | ICD-10-CM | POA: Diagnosis not present

## 2024-12-10 LAB — POCT GLYCOSYLATED HEMOGLOBIN (HGB A1C): Hemoglobin A1C: 6.1 % — AB (ref 4.0–5.6)

## 2024-12-10 NOTE — Progress Notes (Signed)
 Patient ID: [name], female   DOB: 30-Dec-1966, 58 y.o.   MRN: [id number]  HPI: [name] is a 58 y.o.-year-old female, initially referred by her previous endocrinologist, Dr. Hosie, returning for follow-up for DM1, diagnosed at age 46, fairly well controlled, with complications (CAD, h/o AMI, DR) also h/o Graves disease.  Last visit 6 months ago.  Interim history: No increased urination, nausea, chest pain. She used to go to weight management clinic. 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  She is not work nights anymore.  DM1:  Reviewed HbA1c levels: Lab Results  Component Value Date   HGBA1C 6.1 (A) 06/09/2024   HGBA1C 6.0 (A) 01/27/2024   HGBA1C 6.3 (A) 09/26/2023  03/12/2023: HbA1c 6.5%  Insulin  pump:  -Medtronic -Tandem t:slim X2 - since 2022 Updated to the control IQ 7.10.2 version which integrates with a libre 3+ sensor  CGM: -Dexcom G6 >> G7 >> now [employer] 3+  Insulin : -Humalog   Supplies: -[place] for both pump and CGM  Previous pump settings: For days on: Basal rates for days on: 12 AM: 2.00 >> 1.90 >> 1.70 6 AM: 1.80 >> 1.70 11 AM: 2.00  Insulin  to carb ratios: 12 AM: 8 (may need to change to 9) >> 6 11 AM: 8 >> 5  Insulin  correction (sensitivity) factor: 12 AM: 20 6 AM: 20  Blood sugar target: 12 AM: 110   AIT: 5h  For days off: Basal rates for days off: 12 AM: 2.20 >> 2.10 6 AM: 2.70  8 PM: 2.20  Insulin  to carb ratios: 12 AM: 7.5 (may need to change to 8.5)  Insulin  correction (sensitivity) factor: 12 AM: 20  Blood sugar target: 12 AM: 110  Active insulin  time: 3 hrs  Total daily dose = 90.8 units (54% basal, 46% bolus)  Since last visit, since she is now working only days, we changed to the following regimen - latest changes done by pt since last OV: Basal rates: 12 AM: 1.70 >> 1.3 6 AM: 1.70 >> 2.0 8 PM: 2.00 >> 1.6  Insulin  to carb ratios: 12 AM: 6 >> 4 6 AM:  6 11 AM: 5 >> 6 8 PM: 6 >> 8  Insulin  correction (sensitivity) factor: 12 AM: 20 6 AM: 20 >> 35 8 PM: 20 >> 45  Blood sugar target: 12 AM: 110   Active insulin  time: 5 hrs  - changes infusion site: q3 days TDD from basal insulin : 44% (42.08) >> 56% (42 units) >> 52% (39 units) TDD from bolus insulin : 56% (53.26) >> 44% (33 units) >> 48% (36 units)  Total daily dose: 75-100 units a day She had Levemir  36 units daily as backup.  She was also on: - Ozempic  1 mg weekly - but severe reflux >> now off  Meter: Freestyle  Pt checks her sugars more than 4 times a day with her Dexcom CGM:  Previously:  Previously:  Lowest sugar was 40 >> 40s >> 47; she has hypoglycemia awareness at 70.  She has a glucagon  kit at home. No previous hypoglycemia admission.  Highest sugar was Prednisone : 350 >> 300s.+ previous DKA admissions - 2002.    Pt's meals are: - Breakfast: skips or snack - Lunch: apple, cheese with yoghurt - snack: 3 pm - nuts, fruit - Dinner: cauliflower with cheese, beans, rice - Snacks: no sweet drinks; drinks diet coke  - no CKD, last BUN/creatinine:  Lab Results  Component Value Date  BUN 10 10/14/2024   BUN 20 07/01/2024   CREATININE 0.74 10/14/2024   CREATININE 0.77 07/01/2024   Lab Results  Component Value Date   MICRALBCREAT 11 06/09/2024  She is on Cozaar  25 mg daily.  - + HL; last set of lipids: Lab Results  Component Value Date   CHOL 157 10/14/2024   HDL 78 10/14/2024   LDLCALC 63 10/14/2024   TRIG 82 10/14/2024   CHOLHDL 2.0 10/14/2024  On Crestor  10 mg daily along with omega-3 fatty acids.  - last eye exam was in 01/2024: + PDR OU. Dr. Alvia. She had B vitrectomies.  She has blurry vision.  Now also seeing Dr. Odella ([facility]).  - no numbness and tingling in her feet.  Last foot exam was 01/27/2024.  Pt has FH of DMs in father.  Graves ds.:  - Per review of Dr. Viktoria note, she was on methimazole  in 2020 and stopped by herself  in 2021.  Subsequent TFTs were normal: Lab Results  Component Value Date   TSH 2.050 11/26/2023   TSH 1.34 06/12/2023   TSH 1.04 03/14/2023   TSH 1.02 12/05/2022   TSH 1.03 05/15/2022   TSH 1.07 01/11/2022   TSH 0.88 12/15/2020   TSH 0.77 05/23/2020   TSH <0.01 Repeated and verified X2. (L) 01/04/2020   TSH 7.635 (H) 08/12/2019   TSH 0.010 (L) 11/19/2018   TSH 1.22 01/28/2017   TSH 3.666 05/08/2016   TSH 0.02 (L) 11/08/2010   No results found for: TSI  Pt denies: - feeling nodules in neck - hoarseness - dysphagia - choking  She also has a history of RA, psoriasis, and OSA. She has a history of chronic pain syndrome.  ROS: + see HPI  Past Medical History:  Diagnosis Date   ACUT MI ANTEROLAT WALL SUBSQT EPIS CARE    Acute maxillary sinusitis    ALLERGIC RHINITIS, SEASONAL    Allergy    Anemia    Arthritis    ARTHRITIS, RHEUMATOID    shoulders and hands Enbrel>leg swelling Dr. Dolphus   Atrial fibrillation [facility]    a. after CABG.   Back pain    Blood transfusion without reported diagnosis    Bruxism (teeth grinding)    CAD, ARTERY BYPASS GRAFT    a. DES to RCA in 2010 then LAD occlusion s/p CABG 3 06/07/2009 with LIMA to LAD, reverse SVG to D1, reverse SVG to distal RCA. b. Cath 05/08/2016 slightly hypodense region in the intermediate branch, however she had excellent flow, FFR was normal. Vein graft to PDA and the posterolateral branch is patent, patent LIMA to LAD, occluded SVG to diagonal.   CARPAL TUNNEL SYNDROME, BILATERAL    Cataract    CHOLELITHIASIS    Clotting disorder    Contrast media allergy    DERMATITIS, ALLERGIC    DIABETES MELLITUS, TYPE I    on insulin  pump dx'ed age 58 y.o    DIABETIC  RETINOPATHY    GERD (gastroesophageal reflux disease)    Graves' disease    Heart attack (HCC)    Hiatal hernia    HYPERLIPIDEMIA-MIXED    Hypertension    HYPERTHYROIDISM    Dr. Hosie Balder   IBS (irritable bowel syndrome)    IDA (iron  deficiency  anemia)    Infertility, female    Insulin  pump in place    Lymphadenopathy of head and neck    MIGRAINE W/O AURA W/INTRACT W/STATUS MIGRAINOSUS 02/19/2008   PONV (postoperative nausea and vomiting)  Psoriasis    Rheumatoid arthritis (HCC)    SINUS TACHYCARDIA 11/08/2010   Sleep apnea    not using machine yet    Stomach ulcer    SVT (supraventricular tachycardia)    after s/p CABG   Tendonitis    TRIGGER FINGER    all fingers b/l hands    URI    Past Surgical History:  Procedure Laterality Date   ABDOMINAL HYSTERECTOMY     endometriomas b/l; total ? cervix removed; no h/o abnormal pap   Caesarean section     CARDIAC CATHETERIZATION N/A 05/08/2016   Procedure: Left Heart Cath and Cors/Grafts Angiography;  Surgeon: Lonni JONETTA Cash, MD;  Location: [facility] INVASIVE CV LAB;  Service: Cardiovascular;  Laterality: N/A;   CARPAL TUNNEL RELEASE     b/l    CARPAL TUNNEL RELEASE Left 06/06/2021   Procedure: CARPAL TUNNEL RELEASE;  Surgeon: Murrell Kuba, MD;  Location: [facility];  Service: Orthopedics;  Laterality: Left;  AXILLARY BLOCK   CATARACT EXTRACTION, BILATERAL     CESAREAN SECTION     CHOLECYSTECTOMY     COLONOSCOPY WITH PROPOFOL  N/A 03/25/2020   Procedure: COLONOSCOPY WITH PROPOFOL ;  Surgeon: Therisa Bi, MD;  Location: [facility] ENDOSCOPY;  Service: Gastroenterology;  Laterality: N/A;   CORONARY ARTERY BYPASS GRAFT     ESOPHAGOGASTRODUODENOSCOPY (EGD) WITH PROPOFOL  N/A 03/25/2020   Procedure: ESOPHAGOGASTRODUODENOSCOPY (EGD) WITH PROPOFOL ;  Surgeon: Therisa Bi, MD;  Location: [facility] ENDOSCOPY;  Service: Gastroenterology;  Laterality: N/A;   EYE SURGERY     laser x 2 for retinopathy    LEFT HEART CATH AND CORS/GRAFTS ANGIOGRAPHY N/A 02/22/2020   Procedure: LEFT HEART CATH AND CORS/GRAFTS ANGIOGRAPHY;  Surgeon: Cash Lonni JONETTA, MD;  Location: [facility] INVASIVE CV LAB;  Service: Cardiovascular;  Laterality: N/A;   TRIGGER FINGER RELEASE     b/l fingers all     ULNAR NERVE TRANSPOSITION Left 06/06/2021   Procedure: ULNAR NERVE DECOMPRESSION;  Surgeon: Murrell Kuba, MD;  Location: [facility];  Service: Orthopedics;  Laterality: Left;  AXILLARY BLOCK   VITRECTOMY     b/l    Social History   Socioeconomic History   Marital status: Divorced    Spouse name: Not on file   Number of children: Not on file   Years of education: Not on file   Highest education level: Not on file  Occupational History   Not on file  Tobacco Use   Smoking status: Never    Passive exposure: Never   Smokeless tobacco: Never  Vaping Use   Vaping status: Never Used  Substance and Sexual Activity   Alcohol use: Yes    Comment: rarely 1 every 6 months   Drug use: No   Sexual activity: Not Currently    Partners: Male    Birth control/protection: None  Other Topics Concern   Not on file  Social History Narrative   Divorced. Has 2 kids(fraternal twins) daughters age 97. Works at  [employer], Never smoked, denies ETOH, no drugs. Drinks diet coke. No exercise.       DPR daughters Eleanor and Akili Cuda twins          Social Drivers of Health   Tobacco Use: Low Risk (11/13/2024)   Patient History    Smoking Tobacco Use: Never    Smokeless Tobacco Use: Never    Passive Exposure: Never  Financial Resource Strain: Not on file  Food Insecurity: No Food Insecurity (05/22/2022)   Received from [facility]  Epic    Within the past 12 months, you worried that your food would run out before you got the money to buy more.: Never true    Within the past 12 months, the food you bought just didn't last and you didn't have money to get more.: Never true  Transportation Needs: Not on file  Physical Activity: Not on file  Stress: Not on file  Social Connections: Unknown (05/09/2022)   Received from [facility]   Social Network    Social Network: Not on file  Intimate Partner Violence: Unknown (04/03/2022)   Received from [facility]   HITS     Physically Hurt: Not on file    Insult or Talk Down To: Not on file    Threaten Physical Harm: Not on file    Scream or Curse: Not on file  Depression (PHQ2-9): Low Risk (11/13/2024)   Depression (PHQ2-9)    PHQ-2 Score: 0  Alcohol Screen: Not on file  Housing: Not on file  Utilities: Not on file  Health Literacy: Not on file   Current Outpatient Medications on File Prior to Visit  Medication Sig Dispense Refill   acetaminophen -codeine  (TYLENOL  #3) 300-30 MG tablet Take 1 tablet by mouth 2 (two) times daily as needed for moderate pain (pain score 4-6). 60 tablet 3   albuterol  (VENTOLIN  HFA) 108 (90 Base) MCG/ACT inhaler Inhale 2 puffs into the lungs every 4 (four) hours as needed.     Albuterol -Budesonide  (AIRSUPRA ) 90-80 MCG/ACT AERO Inhale 2 puffs into the lungs every 6 (six) hours. 10.7 g 3   Ascorbic Acid 500 MG CAPS Take by mouth.     aspirin  EC 81 MG tablet      Blood Glucose Monitoring Suppl (FREESTYLE FREEDOM LITE) w/Device KIT Use as directed by physician to check blood sugar 1 kit 0   CHELATED MAGNESIUM PO Take 250 mg by mouth daily.     Cholecalciferol (VITAMIN D3) 125 MCG (5000 UT) CAPS Decrease to 1 tab every other day     clopidogrel  (PLAVIX ) 75 MG tablet TAKE 1 TABLET BY MOUTH ONCE DAILY 90 tablet 2   Cyanocobalamin  (VITAMIN B-12 PO) Take by mouth.     cyclobenzaprine  (FLEXERIL ) 10 MG tablet Take 1 tablet (10 mg total) by mouth at bedtime as needed for muscle spasms. 30 tablet 2   cyproheptadine  (PERIACTIN ) 4 MG tablet Take 1 tablet (4 mg total) by mouth 2 (two) times daily as needed (migraines). For migraines - can make you hungry 60 tablet 5   EPINEPHrine  0.3 mg/0.3 mL IJ SOAJ injection Inject 0.3 mg into the muscle as needed for anaphylaxis. 2 each 2   fluticasone  (FLONASE ) 50 MCG/ACT nasal spray Place 2 sprays into both nostrils daily. 16 g 0   fluticasone -salmeterol (ADVAIR ) 100-50 MCG/ACT AEPB Inhale 1 puff into the lungs 2 (two) times daily. 60 each 1   Glucagon   (GVOKE HYPOPEN  2-PACK) 0.5 MG/0.1ML SOAJ Inject 0.5 mg into the skin as needed (Hypoglycemic episode). 0.2 mL 5   glucose blood (FREESTYLE LITE) test strip Use to check blood sugar 3 time(s) daily 100 each 3   glucose blood test strip      insulin  detemir (LEVEMIR ) 100 UNIT/ML injection Inject 36 units into the skin once daily in the event of insulin  pump failure. 10 mL 0   insulin  lispro (HUMALOG ) 100 UNIT/ML injection Inject 75-100 Units into the skin daily as directed. 60 mL 3   Insulin  Pen Needle 31G X 5 MM MISC Use  to inject Forteo  once daily. DO NOT REUSE needles 100 each 2   Insulin  Syringe-Needle U-100 (BD INSULIN  SYRINGE U/F) 31G X 5/16 0.3 ML MISC use with insulin  injections 3 times daily in the event of insulin  pump failure. 100 each 12   ipratropium (ATROVENT ) 0.03 % nasal spray Place 2 sprays into both nostrils every 12 (twelve) hours. 30 mL 0   isosorbide  mononitrate (IMDUR ) 60 MG 24 hr tablet Take 1 tablet (60 mg total) by mouth daily. 90 tablet 1   Krill Oil 500 MG CAPS Take 500 mg by mouth daily.     Lancets (FREESTYLE) lancets Use to check blood sugar 3 time(s) daily. 100 each 99   leflunomide  (ARAVA ) 20 MG tablet Take 1 tablet (20 mg total) by mouth daily. 90 tablet 0   Lifitegrast  (XIIDRA ) 5 % SOLN Place 1 drop into both eyes 2 (two) times daily. 180 each 4   linaclotide  (LINZESS ) 290 MCG CAPS capsule Take 1 capsule (290 mcg total) by mouth daily before breakfast. 90 capsule 1   losartan  (COZAAR ) 100 MG tablet Take 1 tablet (100 mg total) by mouth daily. 90 tablet 3   metoprolol  tartrate (LOPRESSOR ) 50 MG tablet Take 0.5 tablets (25 mg total) by mouth 2 (two) times daily. 45 tablet 3   montelukast  (SINGULAIR ) 10 MG tablet Take 1 tablet (10 mg total) by mouth at bedtime. 90 tablet 3   Multiple Vitamin (MULTIVITAMIN) capsule Take 1 capsule by mouth daily.     ondansetron  (ZOFRAN ) 4 MG tablet Take 1 tablet (4 mg total) by mouth every 8 (eight) hours as needed for nausea or  vomiting. 90 tablet 5   pantoprazole  (PROTONIX ) 40 MG tablet Take 1 tablet (40 mg total) by mouth 2 (two) times daily. 180 tablet 3   potassium chloride  SA (KLOR-CON  M20) 20 MEQ tablet Take 1 tablet (20 mEq total) by mouth daily. 90 tablet 3   rosuvastatin  (CRESTOR ) 20 MG tablet Take 1 tablet (20 mg total) by mouth daily. 90 tablet 3   Teriparatide  (FORTEO ) 560 MCG/2.24ML SOPN Inject 20 mcg into the skin at bedtime. ALTERNATE injection sites 2.24 mL 5   torsemide  (DEMADEX ) 20 MG tablet Take 1 tablet (20 mg total) by mouth daily. 90 tablet 1   traZODone  (DESYREL ) 50 MG tablet Take 1 tablet (50 mg total) by mouth at bedtime. 30 tablet 3   Turmeric (QC TUMERIC COMPLEX PO) Take 1,000 mg by mouth.     Upadacitinib  ER (RINVOQ ) 15 MG TB24 Take 1 tablet (15 mg) by mouth daily. 90 tablet 0   Current Facility-Administered Medications on File Prior to Visit  Medication Dose Route Frequency Provider Last Rate Last Admin   lidocaine  (XYLOCAINE ) 1 % (with pres) injection 6 mL  6 mL Other Once Lovorn, Megan, MD       lidocaine  (XYLOCAINE ) 1 % (with pres) injection 6 mL  6 mL Other Once        lidocaine  (XYLOCAINE ) 1 % (with pres) injection 6 mL  6 mL Other Once        Allergies  Allergen Reactions   Actemra  [Tocilizumab ]    Ramipril Swelling, Rash and Other (See Comments)   Shellfish Protein-Containing Drug Products Swelling    Shrimp    Atorvastatin Rash    Elevated LFT's   Compazine  [Prochlorperazine Edisylate] Other (See Comments)    Neurological reaction   Emgality  [Galcanezumab -Gnlm] Hives and Swelling   Etanercept Swelling and Rash   Infliximab Rash   Iohexol   Iv contrast dye -rash all over   Orencia [Abatacept] Rash   Prochlorperazine Edisylate     unknown   Tofacitinib Rash and Other (See Comments)    Severe abdominal pain    Trokendi  Xr [Topiramate  Er]     Brain fog, memory issues, word finding issues   Shrimp Extract Other (See Comments)   Topiramate  Other (See Comments)    Tramadol      Nausea    Amiodarone Nausea Only   Rituximab Rash    Causes a rash   Family History  Problem Relation Age of Onset   Depression Mother    Anxiety disorder Mother    Hypertension Mother    Hyperlipidemia Mother    Thyroid  disease Mother    Bipolar disorder Mother    Sleep apnea Mother    Eating disorder Mother    Colon polyps Father    Diabetes Father        2   Hypertension Father    Parkinson's disease Father    Hyperlipidemia Father    Depression Father    Sleep apnea Father    Obesity Father    Stroke Paternal Grandmother    Heart disease Other    Hypertension Other    Hyperlipidemia Other    Depression Other    Migraines Other    Heart attack Neg Hx    Colon cancer Neg Hx    Stomach cancer Neg Hx    Esophageal cancer Neg Hx    PE: BP 124/60   Pulse (!) 102   Ht 5' 2 (1.575 m)   Wt 184 lb (83.5 kg)   SpO2 96%   BMI 33.65 kg/m  Wt Readings from Last 10 Encounters:  12/10/24 184 lb (83.5 kg)  11/13/24 185 lb 3.2 oz (84 kg)  11/02/24 186 lb 12.8 oz (84.7 kg)  10/21/24 180 lb (81.6 kg)  10/14/24 184 lb 6.4 oz (83.6 kg)  10/08/24 179 lb (81.2 kg)  09/18/24 179 lb (81.2 kg)  09/02/24 182 lb (82.6 kg)  08/17/24 182 lb (82.6 kg)  07/28/24 183 lb 6.4 oz (83.2 kg)   Constitutional: overweight, in NAD Eyes:  EOMI, no exophthalmos ENT: no neck masses, no cervical lymphadenopathy Cardiovascular: tachycardia, RR, No MRG Respiratory: CTA B Musculoskeletal: no deformities Skin:no rashes Neurological: no tremor with outstretched hands Diabetic Foot Exam - Simple   Simple Foot Form Diabetic Foot exam was performed with the following findings: Yes 12/10/2024  3:50 PM  Visual Inspection No deformities, no ulcerations, no other skin breakdown bilaterally: Yes Sensation Testing Intact to touch and monofilament testing bilaterally: Yes Pulse Check Posterior Tibialis and Dorsalis pulse intact bilaterally: Yes Comments    ASSESSMENT: 1. DM1,  controlled, with complications - CAD, h/o AMI - at 64, s/p stent, s/p CABG - DR, s/p vitrectomy  2. HL  3.  Graves' disease, in remission  4. Obesity class 1  PLAN:  1. Patient with longstanding, fairly well-controlled type 1 diabetes, previously on the Medtronic insulin  pump for years, but now doing well on the t:slim X2 insulin  pump integrated with a Dexcom CGM, started in 2022.  At last visit, HbA1c was excellent, only slightly increased from previously, at 6.1%.  She had 2 sets of pump settings, 1 for when she was working and 1 for when she was not working.  Sugars appears to be excellent at last visit, mainly fluctuating within the target range with only occasional mild hyperglycemic exceptions after meals, possibly due to prednisone  treatment.  She did adjust her basal rates and strengthened her insulin  to carb ratio since the previous visit, which we continued.  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  We also discussed about possibly trying a low-dose Mounjaro .  She was not able to tolerate Ozempic  in the past due to GI symptoms.  I was hoping that this will be covered for her and we discussed that if so, she may need to relax her insulin  to carb ratios after starting Mounjaro . CGM interpretation: -At today's visit, we reviewed her CGM downloads: It appears that 82% of values are in target range (goal >70%), while 13.9% are higher than 180 (goal <25%), and 3.5% are lower than 70 (goal <4%).  The calculated average blood sugar is 135.  The projected HbA1c for the next 3 months (GMI) is 6.5%. -Reviewing the CGM trends, sugars appear to be fluctuating within the target range with only occasional very mild hyperglycemic exceptions after certain meals.  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  In the last  few days, the majority of her blood sugars were very well-controlled except for 1 instance when she had a bad infusion site and sugars remained elevated throughout the night. -At today's visit, we discussed about relaxing her insulin  to carb ratios and sensitivity factors during the night as these were the strictest of her entire 24 hours, but I did not suggest other pump changes. - she is now trying out the freestyle libre 3+ integrated with her pump after upgrading the control IQ program, but she does not like it because the alarms of the sensor only go through the pump.  She is not able to use the CGM app.  She is planning to go back to the Dexcom sensor. - I suggested to: Patient Instructions  Please use the following pump settings: Basal rates: 12 AM: 1.3 6 AM: 2.0 8 PM: 1.6  Insulin  to carb ratios: 12 AM: 4 >> 7 6 AM: 6 11 AM: 6 8 PM: 7  Insulin  correction (sensitivity) factor: 12 AM: 20 >> 40 6 AM: 35 8 PM: 45 >> 40  Blood sugar target: 12 AM: 110   Active insulin  time: 5 hrs  - we checked her HbA1c: 6.1% (stable) - advised to check sugars at different times of the day - 4x a day, rotating check times - advised for yearly eye exams >> she is UTD - return to clinic in 6 months  2. HL - Latest lipid panel was at goal: Lab Results  Component Value Date   CHOL 157 10/14/2024   HDL 78 10/14/2024   LDLCALC 63 10/14/2024   TRIG 82 10/14/2024   CHOLHDL 2.0 10/14/2024  - She continues on Crestor  10 mg daily and omega-3 fatty acids without side effects  3. Graves' disease, in remission - Currently off methimazole  - she has elevated HR, increasing even with light walk; she is on metoprolol  per cardiology - Latest TSH was normal: Lab Results  Component Value Date   TSH 2.050 11/26/2023  - No thyrotoxic signs or symptoms at today's visit - will recheck her TFTs today  4.  Obesity class I -She was not able to tolerate Ozempic  due to reflux -At last visit, she gained 6  pounds so we discussed about trying a low-dose Mounjaro  and  increase the dose as tolerate tolerated -She is also seen in the weight management clinic.  Lela Fendt, MD PhD [facility] Endocrinology

## 2024-12-10 NOTE — Patient Instructions (Addendum)
 Please use the following pump settings: Basal rates: 12 AM: 1.3 6 AM: 2.0 8 PM: 1.6  Insulin  to carb ratios: 12 AM: 4 >> 7 6 AM: 6 11 AM: 6 8 PM: 7  Insulin  correction (sensitivity) factor: 12 AM: 20 >> 40 6 AM: 35 8 PM: 45 >> 40  Blood sugar target: 12 AM: 110   Active insulin  time: 5 hrs

## 2024-12-11 ENCOUNTER — Encounter (INDEPENDENT_AMBULATORY_CARE_PROVIDER_SITE_OTHER): Payer: Self-pay

## 2024-12-11 ENCOUNTER — Encounter: Payer: Self-pay | Admitting: Pharmacist

## 2024-12-11 ENCOUNTER — Other Ambulatory Visit: Payer: Self-pay

## 2024-12-11 ENCOUNTER — Ambulatory Visit: Payer: Self-pay | Admitting: Internal Medicine

## 2024-12-11 ENCOUNTER — Other Ambulatory Visit (HOSPITAL_COMMUNITY): Payer: Self-pay

## 2024-12-11 LAB — T4, FREE: Free T4: 1.3 ng/dL (ref 0.8–1.8)

## 2024-12-11 LAB — T3, FREE: T3, Free: 3.7 pg/mL (ref 2.3–4.2)

## 2024-12-11 LAB — TSH: TSH: 1.02 m[IU]/L (ref 0.40–4.50)

## 2024-12-11 MED ORDER — METFORMIN HCL 500 MG PO TABS
500.0000 mg | ORAL_TABLET | Freq: Two times a day (BID) | ORAL | 3 refills | Status: DC
  Filled 2024-12-11 (×2): qty 180, 90d supply, fill #0

## 2024-12-11 MED ORDER — LANTUS SOLOSTAR 100 UNIT/ML ~~LOC~~ SOPN
32.0000 [IU] | PEN_INJECTOR | Freq: Every day | SUBCUTANEOUS | 99 refills | Status: AC
  Filled 2024-12-11: qty 9, 25d supply, fill #0
  Filled 2024-12-11: qty 15, 41d supply, fill #0
  Filled 2025-01-26: qty 9, 25d supply, fill #1

## 2024-12-11 NOTE — Addendum Note (Signed)
 Addended by: TRIXIE FILE on: 12/11/2024 12:51 PM   Modules accepted: Orders

## 2024-12-14 ENCOUNTER — Encounter: Payer: Self-pay | Admitting: Nurse Practitioner

## 2024-12-14 ENCOUNTER — Other Ambulatory Visit (HOSPITAL_COMMUNITY): Payer: Self-pay

## 2024-12-14 ENCOUNTER — Ambulatory Visit: Payer: Commercial Managed Care - PPO | Admitting: Nurse Practitioner

## 2024-12-14 VITALS — BP 122/60 | HR 75 | Temp 98.2°F | Ht 62.0 in | Wt 183.2 lb

## 2024-12-14 DIAGNOSIS — G43011 Migraine without aura, intractable, with status migrainosus: Secondary | ICD-10-CM

## 2024-12-14 DIAGNOSIS — Z1211 Encounter for screening for malignant neoplasm of colon: Secondary | ICD-10-CM

## 2024-12-14 DIAGNOSIS — M0579 Rheumatoid arthritis with rheumatoid factor of multiple sites without organ or systems involvement: Secondary | ICD-10-CM

## 2024-12-14 DIAGNOSIS — Z0000 Encounter for general adult medical examination without abnormal findings: Secondary | ICD-10-CM

## 2024-12-14 DIAGNOSIS — E103599 Type 1 diabetes mellitus with proliferative diabetic retinopathy without macular edema, unspecified eye: Secondary | ICD-10-CM

## 2024-12-14 DIAGNOSIS — G4733 Obstructive sleep apnea (adult) (pediatric): Secondary | ICD-10-CM

## 2024-12-14 DIAGNOSIS — M810 Age-related osteoporosis without current pathological fracture: Secondary | ICD-10-CM | POA: Diagnosis not present

## 2024-12-14 DIAGNOSIS — I152 Hypertension secondary to endocrine disorders: Secondary | ICD-10-CM

## 2024-12-14 DIAGNOSIS — E0500 Thyrotoxicosis with diffuse goiter without thyrotoxic crisis or storm: Secondary | ICD-10-CM

## 2024-12-14 DIAGNOSIS — I5032 Chronic diastolic (congestive) heart failure: Secondary | ICD-10-CM

## 2024-12-14 DIAGNOSIS — H6993 Unspecified Eustachian tube disorder, bilateral: Secondary | ICD-10-CM

## 2024-12-14 NOTE — Assessment & Plan Note (Signed)
 Patient seems euvolemic in office.  Is followed by cardiology maintained on torsemide  20 mg daily, metoprolol  25 mg twice daily and isosorbide  60 mg daily.  Continue medication as prescribed

## 2024-12-14 NOTE — Assessment & Plan Note (Signed)
 Patient currently followed by endocrinology maintained on long-acting and short acting insulin .  Continue taking medication as prescribed continue following specialist as recommended.  Patient's most recent A1c of 6.1%

## 2024-12-14 NOTE — Assessment & Plan Note (Signed)
 History of same with increasing frequency.  Patient is followed by specialist.  Currently maintained on Tylenol  3 as needed.  Along with cyproheptadine  as needed.  Patient has trialed and failed amitriptyline and Topamax  for preventative therapies.  Told her to discuss specialist with possible CGRP prophylaxis as she is having an increased frequency of migraines 2 times weekly

## 2024-12-14 NOTE — Assessment & Plan Note (Signed)
 History of the same with negative rheumatoid factor.  Patient currently maintained on revoke and leflunomide .  Continue taking medication as prescribed follow-up with specialist as recommended

## 2024-12-14 NOTE — Assessment & Plan Note (Signed)
 Patient controlled currently followed by cardiology maintained on losartan  100 mg daily metoprolol  25 mg twice daily, isosorbide  60 mg daily.  Continue medication as prescribed

## 2024-12-14 NOTE — Assessment & Plan Note (Signed)
 Patient is currently followed by pulmonology.  Using CPAP as directed with decent result per her report.  Continue following specialist as recommended continue using CPAP as prescribed

## 2024-12-14 NOTE — Assessment & Plan Note (Signed)
 History of the same.  Followed by endocrinology.  Did review most recent TSH, free T3, free T4.  Continue following specialist as recommended

## 2024-12-14 NOTE — Assessment & Plan Note (Signed)
 Up-to-date on DEXA scan.  Patient on Forteo  through rheumatology.  Continue taking medication as prescribed

## 2024-12-14 NOTE — Assessment & Plan Note (Signed)
 History of the same that resistant to treatment.  Ambulatory furl to ENT placed today

## 2024-12-14 NOTE — Progress Notes (Signed)
 Established Patient Office Visit  Subjective   Patient ID: [name], female    DOB: [birth year]  Age: 58 y.o. MRN: [id number]  Chief Complaint  Patient presents with   Annual Exam    HPI   DM1: patient is currently followed by endocrinology.  She is taking lantus  and humalog  along with metformin . Dr. Tawni Lands   CAD/CHF: she is follwed by cardiology and maintained on torsemide  20 daily, potassium daily, metoprolol  25mg  BID, losartan  100mg , isosorbide  60mg  daily. Dr. [name]  Migraines: Patient is followed by Duwaine Bright.  Has Tylenol  3 for abortive therapy and Cyproheptadine  as needed.  GERD: maintained on protonix  40mg  BID and followed by GI.  GI provider has left  Constipation: on linzess  and followed by GI.  Gets bowel movements daily  Osteoporosis: on forteo  and followed by rheumotology.  Nightly injections for 2 years  RA: followed by rheumetology and on rinvoq  and leflunominde.  Allergies/osa: singular and airsupra , followed by pulmonology.  Patient sees Dr. Dedra Sanders for cough.  Sees Dr. Goble for OSA  Anemia: followed by hematology. Was released by them   Patient is followed by Dr. Juliene Medicine podiatry for bilateral foot pain  for complete physical and follow up of chronic conditions.  Immunizations: -Tetanus: Completed in 2019 -Influenza: up to date through work  -Shingles: Completed Shingrix series -Pneumonia: Completed 2021  Diet: Fair diet. She is eating 2 meals a day. She does not snakc unless her blood sugar drops. She does diet coke and water.  Exercise: No regular exercise as of late.   Eye exam: Completes annually Dr. Walter. See Dr. Alvia for retinopathy   Dental exam: Completes semi-annually    Colonoscopy: Completed in 03/29/2020, repeat in 5 years. [facility] in [place]  Lung Cancer Screening: NA  Pap smear: hysterectomy   Mammogram: 03/03/2024, repeat in 1 year  DEXA: 03/03/2024 and on forteo   Sleep:  getting to bed around 10 and getting up around 620. States that she does not feel rested. States that she will try to wear it almost all night.      Review of Systems  Constitutional:  Negative for chills and fever.  Respiratory:  Negative for shortness of breath.   Cardiovascular:  Negative for chest pain and leg swelling.  Gastrointestinal:  Negative for abdominal pain, blood in stool, constipation, diarrhea, nausea and vomiting.       Daily with linzess   Genitourinary:  Negative for dysuria and hematuria.  Neurological:  Negative for tingling and headaches.  Psychiatric/Behavioral:  Negative for hallucinations and suicidal ideas.       Objective:     BP 122/60   Pulse 75   Temp 98.2 F (36.8 C) (Oral)   Ht 5' 2 (1.575 m)   Wt 183 lb 3.2 oz (83.1 kg)   SpO2 98%   BMI 33.51 kg/m  BP Readings from Last 3 Encounters:  12/14/24 122/60  12/10/24 124/60  11/13/24 (!) 144/76   Wt Readings from Last 3 Encounters:  12/14/24 183 lb 3.2 oz (83.1 kg)  12/10/24 184 lb (83.5 kg)  11/13/24 185 lb 3.2 oz (84 kg)   SpO2 Readings from Last 3 Encounters:  12/14/24 98%  12/10/24 96%  11/13/24 97%      Physical Exam Vitals and nursing note reviewed.  Constitutional:      Appearance: Normal appearance.  HENT:     Right Ear: Tympanic membrane, ear canal and external ear normal.     Left  Ear: Tympanic membrane, ear canal and external ear normal.     Mouth/Throat:     Mouth: Mucous membranes are moist.     Pharynx: Oropharynx is clear.  Eyes:     Extraocular Movements: Extraocular movements intact.     Pupils: Pupils are equal, round, and reactive to light.  Cardiovascular:     Rate and Rhythm: Normal rate and regular rhythm.     Pulses: Normal pulses.     Heart sounds: Normal heart sounds.  Pulmonary:     Effort: Pulmonary effort is normal.     Breath sounds: Normal breath sounds.  Abdominal:     General: Bowel sounds are normal. There is no distension.     Palpations:  There is no mass.     Tenderness: There is no abdominal tenderness.     Hernia: No hernia is present.  Musculoskeletal:     Right lower leg: No edema.     Left lower leg: No edema.  Lymphadenopathy:     Cervical: No cervical adenopathy.  Skin:    General: Skin is warm.  Neurological:     General: No focal deficit present.     Mental Status: She is alert.     Deep Tendon Reflexes:     Reflex Scores:      Bicep reflexes are 2+ on the right side and 2+ on the left side.      Patellar reflexes are 2+ on the right side and 2+ on the left side.    Comments: Bilateral upper and lower extremity strength 5/5  Psychiatric:        Mood and Affect: Mood normal.        Behavior: Behavior normal.        Thought Content: Thought content normal.        Judgment: Judgment normal.      No results found for any visits on 12/14/24.    The ASCVD Risk score (Arnett DK, et al., 2019) failed to calculate for the following reasons:   Risk score cannot be calculated because patient has a medical history suggesting prior/existing ASCVD   * - Cholesterol units were assumed    Assessment & Plan:   Problem List Items Addressed This Visit       Cardiovascular and Mediastinum   MIGRAINE W/O AURA W/INTRACT W/STATUS MIGRAINOSUS   History of same with increasing frequency.  Patient is followed by specialist.  Currently maintained on Tylenol  3 as needed.  Along with cyproheptadine  as needed.  Patient has trialed and failed amitriptyline and Topamax  for preventative therapies.  Told her to discuss specialist with possible CGRP prophylaxis as she is having an increased frequency of migraines 2 times weekly      Hypertension associated with diabetes [facility])   Patient controlled currently followed by cardiology maintained on losartan  100 mg daily metoprolol  25 mg twice daily, isosorbide  60 mg daily.  Continue medication as prescribed      Chronic diastolic CHF (congestive heart failure) (HCC)   Patient  seems euvolemic in office.  Is followed by cardiology maintained on torsemide  20 mg daily, metoprolol  25 mg twice daily and isosorbide  60 mg daily.  Continue medication as prescribed        Respiratory   OSA on CPAP   Patient is currently followed by pulmonology.  Using CPAP as directed with decent result per her report.  Continue following specialist as recommended continue using CPAP as prescribed        Endocrine  Type 1 diabetes mellitus with proliferative retinopathy [facility])   Patient currently followed by endocrinology maintained on long-acting and short acting insulin .  Continue taking medication as prescribed continue following specialist as recommended.  Patient's most recent A1c of 6.1%      Graves' disease   History of the same.  Followed by endocrinology.  Did review most recent TSH, free T3, free T4.  Continue following specialist as recommended        Nervous and Auditory   Dysfunction of both eustachian tubes   History of the same that resistant to treatment.  Ambulatory furl to ENT placed today      Relevant Orders   Ambulatory referral to ENT     Musculoskeletal and Integument   Rheumatoid arthritis (HCC)   History of the same with negative rheumatoid factor.  Patient currently maintained on revoke and leflunomide .  Continue taking medication as prescribed follow-up with specialist as recommended      Osteoporosis   Up-to-date on DEXA scan.  Patient on Forteo  through rheumatology.  Continue taking medication as prescribed        Other   Preventative health care - Primary   Discussed age-appropriate immunizations and screening exams.  Did review patient's personal, surgical, social, family histories.  Patient is up-to-date with all age-appropriate vaccinations he would like.  Ambulatory referral to GI for CRC screening.  Patient is up-to-date on breast cancer screening, osteoporosis cancer screening.  Patient longer does cervical cancer screening status post  hysterectomy.  Patient was given information at discharge about preventative healthcare maintenance with anticipatory guidance.      Other Visit Diagnoses       Screening for colon cancer       Relevant Orders   Ambulatory referral to Gastroenterology       Return in about 1 year (around 12/14/2025) for CPE and Labs.    Adina Crandall, NP

## 2024-12-14 NOTE — Assessment & Plan Note (Signed)
 Discussed age-appropriate immunizations and screening exams.  Did review patient's personal, surgical, social, family histories.  Patient is up-to-date with all age-appropriate vaccinations he would like.  Ambulatory referral to GI for CRC screening.  Patient is up-to-date on breast cancer screening, osteoporosis cancer screening.  Patient longer does cervical cancer screening status post hysterectomy.  Patient was given information at discharge about preventative healthcare maintenance with anticipatory guidance.

## 2024-12-17 ENCOUNTER — Other Ambulatory Visit: Payer: Self-pay

## 2024-12-17 ENCOUNTER — Encounter (INDEPENDENT_AMBULATORY_CARE_PROVIDER_SITE_OTHER): Payer: Self-pay

## 2024-12-17 ENCOUNTER — Other Ambulatory Visit (HOSPITAL_COMMUNITY): Payer: Self-pay

## 2024-12-17 NOTE — Progress Notes (Signed)
 Specialty Pharmacy Refill Coordination Note  [name] is a 58 y.o. female contacted today regarding refills of specialty medication(s) Teriparatide    Patient requested (Patient-Rptd) Delivery   Delivery date: 12/22/24   Verified address: (Patient-Rptd) [address] [name] [address]   Medication will be filled on: 12/21/24

## 2024-12-21 ENCOUNTER — Other Ambulatory Visit (HOSPITAL_COMMUNITY): Payer: Self-pay

## 2024-12-24 ENCOUNTER — Other Ambulatory Visit: Payer: Self-pay | Admitting: Physician Assistant

## 2024-12-25 ENCOUNTER — Other Ambulatory Visit: Payer: Self-pay

## 2024-12-25 MED ORDER — LEFLUNOMIDE 20 MG PO TABS
20.0000 mg | ORAL_TABLET | Freq: Every day | ORAL | 0 refills | Status: DC
  Filled 2024-12-26: qty 30, 30d supply, fill #0

## 2024-12-25 NOTE — Telephone Encounter (Signed)
 Last Fill: 09/21/2024  Labs: 10/14/2024: CMP normal, lipid panel, 09/18/2024 CBC WNL  Next Visit: 03/16/2025  Last Visit: 10/14/2024  DX: Rheumatoid arthritis of multiple sites with negative rheumatoid factor   Current Dose per office note 10/14/2024: Arava  20 mg 1 tablet by mouth daily   Okay to refill Arava  ?  Contacted patient to advise patient that CBC needs to be updated. Patient stated she would be in Tuesday.

## 2024-12-27 ENCOUNTER — Other Ambulatory Visit (HOSPITAL_COMMUNITY): Payer: Self-pay

## 2024-12-28 ENCOUNTER — Other Ambulatory Visit: Payer: Self-pay

## 2024-12-28 ENCOUNTER — Other Ambulatory Visit: Payer: Self-pay | Admitting: Gastroenterology

## 2024-12-29 ENCOUNTER — Other Ambulatory Visit: Payer: Self-pay

## 2024-12-29 ENCOUNTER — Other Ambulatory Visit: Payer: Self-pay | Admitting: *Deleted

## 2024-12-29 ENCOUNTER — Other Ambulatory Visit (HOSPITAL_COMMUNITY): Payer: Self-pay

## 2024-12-29 ENCOUNTER — Other Ambulatory Visit: Payer: Self-pay | Admitting: Internal Medicine

## 2024-12-29 DIAGNOSIS — Z79899 Other long term (current) drug therapy: Secondary | ICD-10-CM

## 2024-12-29 MED ORDER — "INSULIN SYRINGE-NEEDLE U-100 31G X 5/16"" 0.3 ML MISC"
12 refills | Status: AC
  Filled 2024-12-29: qty 100, 30d supply, fill #0

## 2024-12-29 MED ORDER — FREESTYLE LITE TEST VI STRP
ORAL_STRIP | 3 refills | Status: AC
  Filled 2024-12-29: qty 250, 83d supply, fill #0

## 2024-12-29 MED ORDER — FREESTYLE FREEDOM LITE W/DEVICE KIT
PACK | 0 refills | Status: AC
  Filled 2024-12-29: qty 1, 30d supply, fill #0

## 2024-12-30 ENCOUNTER — Ambulatory Visit: Payer: Self-pay | Admitting: Rheumatology

## 2024-12-30 ENCOUNTER — Other Ambulatory Visit: Payer: Self-pay

## 2024-12-30 LAB — COMPREHENSIVE METABOLIC PANEL WITH GFR
AG Ratio: 2.2 (calc) (ref 1.0–2.5)
ALT: 23 U/L (ref 6–29)
AST: 27 U/L (ref 10–35)
Albumin: 4.2 g/dL (ref 3.6–5.1)
Alkaline phosphatase (APISO): 84 U/L (ref 37–153)
BUN: 10 mg/dL (ref 7–25)
CO2: 29 mmol/L (ref 20–32)
Calcium: 9.1 mg/dL (ref 8.6–10.4)
Chloride: 103 mmol/L (ref 98–110)
Creat: 0.76 mg/dL (ref 0.50–1.03)
Globulin: 1.9 g/dL (ref 1.9–3.7)
Glucose, Bld: 97 mg/dL (ref 65–99)
Potassium: 3.8 mmol/L (ref 3.5–5.3)
Sodium: 141 mmol/L (ref 135–146)
Total Bilirubin: 0.4 mg/dL (ref 0.2–1.2)
Total Protein: 6.1 g/dL (ref 6.1–8.1)
eGFR: 91 mL/min/1.73m2

## 2024-12-30 LAB — CBC WITH DIFFERENTIAL/PLATELET
Absolute Lymphocytes: 1747 {cells}/uL (ref 850–3900)
Absolute Monocytes: 557 {cells}/uL (ref 200–950)
Basophils Absolute: 38 {cells}/uL (ref 0–200)
Basophils Relative: 0.8 %
Eosinophils Absolute: 178 {cells}/uL (ref 15–500)
Eosinophils Relative: 3.7 %
HCT: 36.3 % (ref 35.9–46.0)
Hemoglobin: 11.8 g/dL (ref 11.7–15.5)
MCH: 28.7 pg (ref 27.0–33.0)
MCHC: 32.5 g/dL (ref 31.6–35.4)
MCV: 88.3 fL (ref 81.4–101.7)
MPV: 9.6 fL (ref 7.5–12.5)
Monocytes Relative: 11.6 %
Neutro Abs: 2280 {cells}/uL (ref 1500–7800)
Neutrophils Relative %: 47.5 %
Platelets: 247 Thousand/uL (ref 140–400)
RBC: 4.11 Million/uL (ref 3.80–5.10)
RDW: 13 % (ref 11.0–15.0)
Total Lymphocyte: 36.4 %
WBC: 4.8 Thousand/uL (ref 3.8–10.8)

## 2025-01-01 ENCOUNTER — Ambulatory Visit: Admitting: Physical Medicine and Rehabilitation

## 2025-01-04 ENCOUNTER — Other Ambulatory Visit (HOSPITAL_COMMUNITY): Payer: Self-pay

## 2025-01-04 ENCOUNTER — Other Ambulatory Visit: Payer: Self-pay

## 2025-01-04 NOTE — Progress Notes (Signed)
 Specialty Pharmacy Refill Coordination Note  [name] is a 59 y.o. female contacted today regarding refills of specialty medication(s) Upadacitinib  (Rinvoq )   Patient requested Delivery   Delivery date: 01/07/25   Verified address: [address] [place] [address]   Medication will be filled on: 01/06/25

## 2025-01-10 ENCOUNTER — Other Ambulatory Visit: Payer: Self-pay | Admitting: Cardiovascular Disease

## 2025-01-11 ENCOUNTER — Other Ambulatory Visit: Payer: Self-pay

## 2025-01-11 MED ORDER — CLOPIDOGREL BISULFATE 75 MG PO TABS
75.0000 mg | ORAL_TABLET | Freq: Every day | ORAL | 2 refills | Status: AC
  Filled 2025-01-11: qty 90, 90d supply, fill #0

## 2025-01-12 ENCOUNTER — Other Ambulatory Visit: Payer: Self-pay

## 2025-01-12 ENCOUNTER — Other Ambulatory Visit (HOSPITAL_COMMUNITY): Payer: Self-pay

## 2025-01-12 NOTE — Progress Notes (Signed)
 Copay card for teriparaitde entered. Please dispense Alvogen generic. Thnaks

## 2025-01-13 ENCOUNTER — Other Ambulatory Visit: Payer: Self-pay

## 2025-01-14 ENCOUNTER — Other Ambulatory Visit (HOSPITAL_COMMUNITY): Payer: Self-pay

## 2025-01-14 NOTE — Progress Notes (Signed)
 Specialty Pharmacy Refill Coordination Note  MyChart Questionnaire Submission  [name] is a 59 y.o. female contacted today regarding refills of specialty medication(s) Forteo .  Doses on hand: (Patient-Rptd) 10   Next inj starts: 01/24/25  Patient requested: (Patient-Rptd) Delivery   Delivery date: 01/19/25  Verified address: [address] [name] [address]  Medication will be filled on 01/18/25

## 2025-01-15 ENCOUNTER — Encounter (INDEPENDENT_AMBULATORY_CARE_PROVIDER_SITE_OTHER): Payer: Commercial Managed Care - PPO | Admitting: Ophthalmology

## 2025-01-15 DIAGNOSIS — H35033 Hypertensive retinopathy, bilateral: Secondary | ICD-10-CM | POA: Diagnosis not present

## 2025-01-15 DIAGNOSIS — E103593 Type 1 diabetes mellitus with proliferative diabetic retinopathy without macular edema, bilateral: Secondary | ICD-10-CM | POA: Diagnosis not present

## 2025-01-15 DIAGNOSIS — I10 Essential (primary) hypertension: Secondary | ICD-10-CM

## 2025-01-15 DIAGNOSIS — Z794 Long term (current) use of insulin: Secondary | ICD-10-CM

## 2025-01-18 ENCOUNTER — Ambulatory Visit (INDEPENDENT_AMBULATORY_CARE_PROVIDER_SITE_OTHER): Admitting: Family Medicine

## 2025-01-18 ENCOUNTER — Other Ambulatory Visit (HOSPITAL_COMMUNITY): Payer: Self-pay

## 2025-01-18 ENCOUNTER — Other Ambulatory Visit: Payer: Self-pay | Admitting: Rheumatology

## 2025-01-18 ENCOUNTER — Other Ambulatory Visit: Payer: Self-pay | Admitting: Gastroenterology

## 2025-01-18 ENCOUNTER — Encounter (INDEPENDENT_AMBULATORY_CARE_PROVIDER_SITE_OTHER): Payer: Self-pay | Admitting: Family Medicine

## 2025-01-18 ENCOUNTER — Other Ambulatory Visit: Payer: Self-pay

## 2025-01-18 VITALS — BP 149/75 | HR 89 | Temp 98.4°F | Ht 62.5 in | Wt 180.0 lb

## 2025-01-18 DIAGNOSIS — I10 Essential (primary) hypertension: Secondary | ICD-10-CM | POA: Diagnosis not present

## 2025-01-18 DIAGNOSIS — E103599 Type 1 diabetes mellitus with proliferative diabetic retinopathy without macular edema, unspecified eye: Secondary | ICD-10-CM

## 2025-01-18 DIAGNOSIS — Z7984 Long term (current) use of oral hypoglycemic drugs: Secondary | ICD-10-CM

## 2025-01-18 DIAGNOSIS — Z6832 Body mass index (BMI) 32.0-32.9, adult: Secondary | ICD-10-CM

## 2025-01-18 DIAGNOSIS — G4733 Obstructive sleep apnea (adult) (pediatric): Secondary | ICD-10-CM

## 2025-01-18 DIAGNOSIS — E1059 Type 1 diabetes mellitus with other circulatory complications: Secondary | ICD-10-CM

## 2025-01-18 DIAGNOSIS — I152 Hypertension secondary to endocrine disorders: Secondary | ICD-10-CM

## 2025-01-18 DIAGNOSIS — E669 Obesity, unspecified: Secondary | ICD-10-CM

## 2025-01-18 MED ORDER — CYCLOBENZAPRINE HCL 10 MG PO TABS
10.0000 mg | ORAL_TABLET | Freq: Every evening | ORAL | 2 refills | Status: AC | PRN
  Filled 2025-01-18: qty 30, 30d supply, fill #0

## 2025-01-18 NOTE — Progress Notes (Signed)
 "  [name] [name] [name], D.O.  ABFM, ABOM Specializing in Clinical Bariatric Medicine  Office located at: [address] [place], [place]  [address]          A) FOR THE CHRONIC DISEASE OF OBESITY:  Obesity (BMI 30-39.9), Starting BMI 11/26/23 32.02 BMI 32.0-32.9,adult -- Current BMI 32.38  Chief complaint: Obesity [name] is here to discuss her progress with her obesity treatment plan.   History of present illness / Interval history:  [name] is here today for her follow-up office visit.  Since last OV on 10/21/2024 with provider: Dr. [name], patient weight is unchanged.   excellent adherence to reduced calorie nutritional plan.  Pt has been struggling with:  []  meal planning and prepping []  exercise []  sleep []  stressors []  mood []  chronic or acute medical conditions-  []  eating out more [x]  Hitting protein intake   Pt has been working on and improving their:  []  meal planning and prepping []  exercise [x]  Sleep [x]  water intake []  strategies to better manage personal stressors []  strategies to better manage mood []  eating out less    When asked by CMA prior to our OV, pt states they have been:  - Focused on eating fresh, unprocessed foods?  Yes   - Focused on eating lean proteins with each meal?  Yes; hitting protein intake about 70% of the time - Sleeping 7-9 Hours/ Night?  Yes   - Skipping Meals?  No   - States [name] is following her healthy eating plan approximately 100 % of the time.  Recent weight loss data history  10/21/24 01/18/25 16:00   Body Fat % 46.2 % 46.1 %  Muscle Mass (lbs) 92.2 lbs 92 lbs  Fat Mass (lbs) 83.6 lbs 83 lbs  Total Body Water (lbs) 73.8 lbs 74 lbs  Visceral Fat Rating  12 12    Counseling done on how various foods/ behaviors will affect these numbers and how to maximize weight loss success discussed today in detail based on these findings.  Total lbs lost to date: +2 lbs Total Fat Mass in lbs lost to date: -2.8  lbs Total weight loss percentage to date since starting program:  +1.12 %   Physician directed Nutrition Therapy prescription: [name] is on the 1200 calories Vegetarian Plan.     [name] is currently in the action stage of change. As such, her goal is to continue and/ or modify their weight management treatment plan.  [name] has agreed to: continue current plan   Physician directed Behavioral Modification prescription: Evidence-based interventions for healthy behavior change were utilized today including the discussion of small changes and SMART goals.  SPECIFIC SMART goals for next OV:  Start walking 30 minutes twice a week.  Regarding patient's less desirable eating habits and patterns, we employed the technique of small changes.  For next OV, pt will focus on increasing lean protein and journal caloric, protein, and sodium intake.   We discussed the following today: increasing lean protein intake to established goals and practice mindfulness eating and understand the difference between hunger signals and cravings, atomic habits,  Additional resources provided today: None   Physician directed Physical Activity prescription: Pt is not exercising.  barriers to successful adherence to exercise for wt loss:  Bad cough, that flares up in the cold.  [place] has been advised to start walking in a controlled environment such as a hospital, [employer], or the mall.      [name] has agreed to :  Think about enjoyable ways to increase daily physical activity and overcoming barriers to exercise, Increase physical activity in their day and reduce sedentary time (increase NEAT)., and Combine aerobic and strengthening exercises for efficiency and improved cardiometabolic health.    Medical Interventions/ Pharmacotherapy Previous Bariatric surgery: none   Previously tried medical weight loss medications/ therapies:  Was prescribed GLP-1 in the past by endocrinologist but denied by insurance and cost  prohibitive.   Pharmacotherapy for weight loss: [name] is currently taking Metformin  for medical weight loss.   Pt has failed to GLP1 therpay from cardiology, endo, us  and would like to ask pulmonology about Sleep OSA coverage.   We discussed various medication options to help [name] with her weight loss efforts and we both agreed to:  Continue with current nutritional and behavioral strategies    B) OBESITY RELATED CONDITIONS ADDRESSED TODAY:  Type 1 diabetes mellitus with proliferative retinopathy without macular edema, unspecified [facility]) Assessment & Plan Lab Results  Component Value Date   HGBA1C 6.1 (A) 12/10/2024   HGBA1C 6.1 (A) 06/09/2024   HGBA1C 6.0 (A) 01/27/2024   INSULIN  <0.4 (L) 11/26/2023  Currently on Metformin  500 mg twice daily, started in December per Endo to help manage disease and promote weight loss. Most recent A1c on 12/10/2024, is at 6.1. Pt reports hunger and cravings are overall well controlled. [name] has been monitoring at sugars at home and they have been well controlled.  Cont medication per endo. Cont to decrease simple carbs and increase lean protein. Increase exercise as able. F/u with PCP and specialists as needed.     Hypertension associated with diabetes (HCC) Assessment & Plan Last 3 blood pressure readings in our office are as follows: BP Readings from Last 3 Encounters:  01/18/25 (!) 149/75  12/14/24 122/60  12/10/24 124/60   The ASCVD Risk score (Arnett DK, et al., 2019) failed to calculate for the following reasons:   Risk score cannot be calculated because patient has a medical history suggesting prior/existing ASCVD   * - Cholesterol units were assumed  Lab Results  Component Value Date   CREATININE 0.76 12/29/2024  Currently on Imdur , losartan , and lopressor  with good compliance and tolerance. BP is elevated today at 149/75; optimal <120/80. Pt asx. No acute concerns. Pt reports [name] monitors her BP at home and states it is well  controlled. Cont medication per PCP. Cont low-sodium, heart-healthy diet. Increase water intake and exercise as able. F/u with PCP as needed.    OSA on CPAP- severe Assessment & Plan Last seen by Dr. Jess of pulmonology on 07/28/2024.  Had a home sleep test performed February 2022 which showed severe OSA with an AHI of 31.6 and minimial oxygen saturation of 81. The mean pulse ox was 92. There was a strong exacerbation during REM sleep but no significant hypoxia noted. Pt is very compliant and uses CPAP nightly. No noted issues or concerns. Pt has failed to obtain GLP-1 therapy from cardiology, Endo, Us , and would like to ask sleep med doc for prescription of Zepbound  for Sleep OSA coverage. Cont compliance with CPAP nightly. F/u with pulmonology as needed.     Follow up:   Return in about 6 weeks (around 03/01/2025).  [name] was informed of the importance of frequent follow up visits to maximize her success with intensive lifestyle modifications for her multiple health conditions.    Weight Summary and Biometrics   Weight Lost Since Last Visit: 0  Weight Gained Since Last Visit: 0  Vitals Temp: 98.4 F (36.9 C) BP: (!) 149/75 Pulse Rate: 89 SpO2: 94 %   Anthropometric Measurements Height: 5' 2.5 (1.588 m) Weight: 180 lb (81.6 kg) BMI (Calculated): 32.38 Weight at Last Visit: 180 lb Weight Lost Since Last Visit: 0 Weight Gained Since Last Visit: 0 Starting Weight: 178 lb Total Weight Loss (lbs): 0 lb (0 kg) Peak Weight: 190 lb   Body Composition  Body Fat %: 46.1 % Fat Mass (lbs): 83 lbs Muscle Mass (lbs): 92 lbs Total Body Water (lbs): 74 lbs Visceral Fat Rating : 12   Other Clinical Data Fasting: No Labs: No Today's Visit #: 8 Starting Date: 11/26/23    Objective:   PHYSICAL EXAM: Blood pressure (!) 149/75, pulse 89, temperature 98.4 F (36.9 C), height 5' 2.5 (1.588 m), weight 180 lb (81.6 kg), SpO2 94%. Body mass index is 32.4 kg/m. General: [name]  is overweight, cooperative and in no acute distress. PSYCH: Has normal mood, affect and thought process.   HEENT: EOMI, sclerae are anicteric. Lungs: Normal breathing effort, no conversational dyspnea. Extremities: Moves * 4 Neurologic: A and O * 3, good insight  DIAGNOSTIC DATA REVIEWED: BMET    Component Value Date/Time   NA 141 12/29/2024 0804   NA 140 11/26/2023 1123   K 3.8 12/29/2024 0804   CL 103 12/29/2024 0804   CO2 29 12/29/2024 0804   GLUCOSE 97 12/29/2024 0804   BUN 10 12/29/2024 0804   BUN 10 11/26/2023 1123   CREATININE 0.76 12/29/2024 0804   CALCIUM  9.1 12/29/2024 0804   GFRNONAA >60 06/01/2021 0757   GFRNONAA 74 04/07/2021 0855   GFRAA 86 04/07/2021 0855   Lab Results  Component Value Date   HGBA1C 6.1 (A) 12/10/2024   HGBA1C 6.8 06/26/2007   Lab Results  Component Value Date   INSULIN  <0.4 (L) 11/26/2023   Lab Results  Component Value Date   TSH 1.02 12/10/2024   CBC    Component Value Date/Time   WBC 4.8 12/29/2024 0804   RBC 4.11 12/29/2024 0804   HGB 11.8 12/29/2024 0804   HGB 12.3 09/18/2024 1502   HGB 13.4 02/15/2020 1656   HCT 36.3 12/29/2024 0804   HCT 38.9 02/15/2020 1656   PLT 247 12/29/2024 0804   PLT 174 09/18/2024 1502   PLT 172 02/15/2020 1656   MCV 88.3 12/29/2024 0804   MCV 87 02/15/2020 1656   MCH 28.7 12/29/2024 0804   MCHC 32.5 12/29/2024 0804   RDW 13.0 12/29/2024 0804   RDW 12.8 02/15/2020 1656   Iron  Studies    Component Value Date/Time   IRON  59 09/18/2024 1502   TIBC 343 09/18/2024 1502   FERRITIN 36 09/18/2024 1502   IRONPCTSAT 17 09/18/2024 1502   Lipid Panel     Component Value Date/Time   CHOL 157 10/14/2024 1608   CHOL 162 11/26/2023 1123   TRIG 82 10/14/2024 1608   HDL 78 10/14/2024 1608   HDL 76 11/26/2023 1123   CHOLHDL 2.0 10/14/2024 1608   VLDL 19.0 12/05/2022 1558   LDLCALC 63 10/14/2024 1608   Hepatic Function Panel     Component Value Date/Time   PROT 6.1 12/29/2024 0804   PROT 6.1  11/26/2023 1123   ALBUMIN 4.4 11/26/2023 1123   AST 27 12/29/2024 0804   ALT 23 12/29/2024 0804   ALKPHOS 76 11/26/2023 1123   BILITOT 0.4 12/29/2024 0804   BILITOT 0.3 11/26/2023 1123   BILIDIR 0.1 11/08/2010 1257   IBILI 0.4 07/05/2009  9256      Component Value Date/Time   TSH 1.02 12/10/2024 1619   Nutritional Lab Results  Component Value Date   VD25OH 47 10/15/2024   VD25OH 92.9 11/26/2023   VD25OH 74.19 12/05/2022    Attestations:   LILLETTE Feliciano Mingle, acting as a stage manager for [name] [name], DO., have compiled all relevant documentation for today's office visit on behalf of [name] [name], DO, while in the presence of [employer], DO.  I have spent 40 minutes in the care of the patient today including 30 minutes face-to-face assessing and reviewing listed medical problems above as outlined in office visit note and providing nutritional and behavioral counseling as outlined in obesity care plan.   I have reviewed the above documentation for accuracy and completeness, and I agree with the above. [name] JINNY [name], D.O.  The 21st Century Cures Act was signed into law in 2016 which includes the topic of electronic health records.  This provides immediate access to information in MyChart.  This includes consultation notes, operative notes, office notes, lab results and pathology reports.  If you have any questions about what you read please let us  know at your next visit so we can discuss your concerns and take corrective action if need be.  We are right here with you.  "

## 2025-01-18 NOTE — Telephone Encounter (Signed)
 Last Fill: 10/16/2024  Next Visit: 03/16/2025  Last Visit: 10/14/2024  Dx: Rheumatoid arthritis of multiple sites with negative rheumatoid factor   Current Dose per office note on 10/14/2024: not discussed  Okay to refill Flexeril ?

## 2025-01-19 ENCOUNTER — Other Ambulatory Visit (HOSPITAL_COMMUNITY): Payer: Self-pay

## 2025-01-19 ENCOUNTER — Other Ambulatory Visit: Payer: Self-pay

## 2025-01-19 MED ORDER — PANTOPRAZOLE SODIUM 40 MG PO TBEC
40.0000 mg | DELAYED_RELEASE_TABLET | Freq: Two times a day (BID) | ORAL | 0 refills | Status: AC
  Filled 2025-01-19: qty 60, 30d supply, fill #0

## 2025-01-25 ENCOUNTER — Other Ambulatory Visit: Payer: Self-pay | Admitting: Rheumatology

## 2025-01-25 ENCOUNTER — Other Ambulatory Visit (HOSPITAL_COMMUNITY): Payer: Self-pay

## 2025-01-25 ENCOUNTER — Other Ambulatory Visit: Payer: Self-pay

## 2025-01-25 MED ORDER — LEFLUNOMIDE 20 MG PO TABS
20.0000 mg | ORAL_TABLET | Freq: Every day | ORAL | 0 refills | Status: AC
  Filled 2025-01-25: qty 90, 90d supply, fill #0

## 2025-01-25 NOTE — Telephone Encounter (Signed)
 Last Fill: 12/25/2024 (30 day supply)  Labs: 12/29/2024   Next Visit: 03/16/2025  Last Visit: 10/14/2024  DX:  Rheumatoid arthritis of multiple sites with negative rheumatoid factor   Current Dose per office note on 10/14/2024: Arava  20 mg 1 tablet by mouth daily.   Okay to refill Arava  ?

## 2025-01-26 ENCOUNTER — Other Ambulatory Visit: Payer: Self-pay

## 2025-01-26 ENCOUNTER — Ambulatory Visit (INDEPENDENT_AMBULATORY_CARE_PROVIDER_SITE_OTHER): Admitting: Otolaryngology

## 2025-01-26 ENCOUNTER — Encounter (INDEPENDENT_AMBULATORY_CARE_PROVIDER_SITE_OTHER): Payer: Self-pay | Admitting: Otolaryngology

## 2025-01-26 ENCOUNTER — Other Ambulatory Visit (HOSPITAL_COMMUNITY): Payer: Self-pay

## 2025-01-26 VITALS — BP 130/77 | HR 76 | Ht 62.5 in | Wt 180.0 lb

## 2025-01-26 DIAGNOSIS — H905 Unspecified sensorineural hearing loss: Secondary | ICD-10-CM

## 2025-01-26 DIAGNOSIS — H6990 Unspecified Eustachian tube disorder, unspecified ear: Secondary | ICD-10-CM

## 2025-01-26 DIAGNOSIS — H903 Sensorineural hearing loss, bilateral: Secondary | ICD-10-CM

## 2025-01-26 DIAGNOSIS — H6993 Unspecified Eustachian tube disorder, bilateral: Secondary | ICD-10-CM

## 2025-01-26 DIAGNOSIS — J329 Chronic sinusitis, unspecified: Secondary | ICD-10-CM

## 2025-01-26 MED ORDER — AMOXICILLIN-POT CLAVULANATE 875-125 MG PO TABS
1.0000 | ORAL_TABLET | Freq: Two times a day (BID) | ORAL | 0 refills | Status: AC
  Filled 2025-01-26: qty 20, 10d supply, fill #0

## 2025-01-26 NOTE — Progress Notes (Signed)
 Reason for Consult: Eustachian tube dysfunction Referring Physician: Dr. Wendee Clarity [name] is an 59 y.o. female.  HPI: For evaluation of eustachian tube dysfunction and possible sinusitis.  She has had a chronic cough that is productive.  She was treated with multiple rounds of antibiotics previously but that was stopped because it did not seem to make a difference in the cough or production.  She has had subjective decreased hearing right greater than the left.  She was told she had fluid in the right ear.  She seems to think that she gets fluid frequently.  She does have fairly constant popping and pressure in the ears that fluctuates.  She has some headache.  She has congestion and drainage from the nose.  Not sure if there is any purulence to it.  No hoarseness, dysphagia, or odynophagia.  She has not had any antibiotics in about 6 months.  She cannot take nasal steroid sprays because of epistaxis.  Past Medical History:  Diagnosis Date   ACUT MI ANTEROLAT WALL SUBSQT EPIS CARE    Acute maxillary sinusitis    ALLERGIC RHINITIS, SEASONAL    Allergy    Anemia    Arthritis    ARTHRITIS, RHEUMATOID    shoulders and hands Enbrel>leg swelling Dr. Dolphus   Atrial fibrillation [facility])    a. after CABG.   Back pain    Blood transfusion without reported diagnosis    Bruxism (teeth grinding)    CAD, ARTERY BYPASS GRAFT    a. DES to RCA in 2010 then LAD occlusion s/p CABG 3 06/07/2009 with LIMA to LAD, reverse SVG to D1, reverse SVG to distal RCA. b. Cath 05/08/2016 slightly hypodense region in the intermediate branch, however she had excellent flow, FFR was normal. Vein graft to PDA and the posterolateral branch is patent, patent LIMA to LAD, occluded SVG to diagonal.   CARPAL TUNNEL SYNDROME, BILATERAL    Cataract    CHOLELITHIASIS    Clotting disorder    Contrast media allergy    DERMATITIS, ALLERGIC    DIABETES MELLITUS, TYPE I    on insulin  pump dx'ed age 60 y.o    DIABETIC  RETINOPATHY     GERD (gastroesophageal reflux disease)    Graves' disease    Heart attack (HCC)    Hiatal hernia    HYPERLIPIDEMIA-MIXED    Hypertension    HYPERTHYROIDISM    Dr. Hosie Balder   IBS (irritable bowel syndrome)    IDA (iron  deficiency anemia)    Infertility, female    Insulin  pump in place    Lymphadenopathy of head and neck    MIGRAINE W/O AURA W/INTRACT W/STATUS MIGRAINOSUS 02/19/2008   PONV (postoperative nausea and vomiting)    Psoriasis    Rheumatoid arthritis (HCC)    SINUS TACHYCARDIA 11/08/2010   Sleep apnea    not using machine yet    Stomach ulcer    SVT (supraventricular tachycardia)    after s/p CABG   Tendonitis    TRIGGER FINGER    all fingers b/l hands    URI     Past Surgical History:  Procedure Laterality Date   ABDOMINAL HYSTERECTOMY     endometriomas b/l; total ? cervix removed; no h/o abnormal pap   Caesarean section     CARDIAC CATHETERIZATION N/A 05/08/2016   Procedure: Left Heart Cath and Cors/Grafts Angiography;  Surgeon: Lonni JONETTA Cash, MD;  Location: [facility];  Service: Cardiovascular;  Laterality: N/A;  CARPAL TUNNEL RELEASE     b/l    CARPAL TUNNEL RELEASE Left 06/06/2021   Procedure: CARPAL TUNNEL RELEASE;  Surgeon: Murrell Kuba, MD;  Location: [facility];  Service: Orthopedics;  Laterality: Left;  AXILLARY BLOCK   CATARACT EXTRACTION, BILATERAL     CESAREAN SECTION     CHOLECYSTECTOMY     COLONOSCOPY WITH PROPOFOL  N/A 03/25/2020   Procedure: COLONOSCOPY WITH PROPOFOL ;  Surgeon: Therisa Bi, MD;  Location: [facility];  Service: Gastroenterology;  Laterality: N/A;   CORONARY ARTERY BYPASS GRAFT     ESOPHAGOGASTRODUODENOSCOPY (EGD) WITH PROPOFOL  N/A 03/25/2020   Procedure: ESOPHAGOGASTRODUODENOSCOPY (EGD) WITH PROPOFOL ;  Surgeon: Therisa Bi, MD;  Location: [facility];  Service: Gastroenterology;  Laterality: N/A;   EYE SURGERY     laser x 2 for retinopathy    eyelid surgery Bilateral 08/2024    LEFT HEART CATH AND CORS/GRAFTS ANGIOGRAPHY N/A 02/22/2020   Procedure: LEFT HEART CATH AND CORS/GRAFTS ANGIOGRAPHY;  Surgeon: Verlin Lonni BIRCH, MD;  Location: [facility];  Service: Cardiovascular;  Laterality: N/A;   TRIGGER FINGER RELEASE     b/l fingers all    ULNAR NERVE TRANSPOSITION Left 06/06/2021   Procedure: ULNAR NERVE DECOMPRESSION;  Surgeon: Murrell Kuba, MD;  Location: [facility];  Service: Orthopedics;  Laterality: Left;  AXILLARY BLOCK   VITRECTOMY     b/l     Family History  Problem Relation Age of Onset   Depression Mother    Anxiety disorder Mother    Hypertension Mother    Hyperlipidemia Mother    Thyroid  disease Mother    Bipolar disorder Mother    Sleep apnea Mother    Eating disorder Mother    Cancer Mother    Hearing loss Mother    Colon polyps Father    Diabetes Father        2   Hypertension Father    Parkinson's disease Father    Hyperlipidemia Father    Depression Father    Sleep apnea Father    Obesity Father    ADD / ADHD Father    Arthritis Father    Hearing loss Father    Miscarriages / Stillbirths Sister    Stroke Paternal Grandmother    Heart disease Other    Hypertension Other    Hyperlipidemia Other    Depression Other    Migraines Other    Heart attack Neg Hx    Colon cancer Neg Hx    Stomach cancer Neg Hx    Esophageal cancer Neg Hx     Social History:  reports that she has never smoked. She has never been exposed to tobacco smoke. She has never used smokeless tobacco. She reports current alcohol use. She reports that she does not use drugs.  Allergies: Allergies[1]   No results found. However, due to the size of the patient record, not all encounters were searched. Please check Results Review for a complete set of results.  No results found.  ROS Blood pressure 130/77, pulse 76, height 5' 2.5 (1.588 m), weight 180 lb (81.6 kg), SpO2 94%. Physical Exam Constitutional:      Appearance: Normal  appearance.  HENT:     Head: Normocephalic and atraumatic.     Right Ear: Tympanic membrane is without lesions and middle ear aerated, ear canal and external ear normal.     Left Ear: Tympanic membrane is without lesions and middle ear aerated, ear canal and external ear normal.  Nose: Nose without deviation of septum.  Turbinates with mild hypertrophy, No significant swelling or masses.     Oral cavity/oropharynx: Mucous membranes are moist. No lesions or masses    Larynx: normal voice. Mirror attempted without success    Eyes:     Extraocular Movements: Extraocular movements intact.     Conjunctiva/sclera: Conjunctivae normal.     Pupils: Pupils are equal, round, and reactive to light.  Cardiovascular:     Rate and Rhythm: Normal rate.  Pulmonary:     Effort: Pulmonary effort is normal.  Musculoskeletal:     Cervical back: Normal range of motion and neck supple. No rigidity.  Lymphadenopathy:     Cervical: No cervical adenopathy or masses.salivary glands without lesions. .     Salivary glands- no mass or swelling Neurological:     Mental Status: He is alert. CN 2-12 intact. No nystagmus      Assessment/Plan: Eustachian tube dysfunction/rhinitis/sinusitis/cough-this most likely the cough and eustachian tube dysfunction are secondary to sinusitis or rhinitis and I am going to treat that with Augmentin .  She cannot take nasal steroid sprays because of epistaxis.  I will use saline irrigation twice daily.  She will follow-up after the Augmentin  and we will decide about a CT scan.  Sensorineural hearing loss-she will get an audiogram for the hearing loss which may be nerve loss or it could be eustachian tube dysfunction issues.  Today she did not have any effusion in the ears.  Norleen Notice 01/26/2025, 3:56 PM        [1]  Allergies Allergen Reactions   Actemra  [Tocilizumab ]    Ramipril Swelling, Rash and Other (See Comments)   Shellfish Protein-Containing Drug Products  Swelling    Shrimp    Tofacitinib Citrate Dermatitis    tofacitinib citrate   Atorvastatin Rash    Elevated LFT's   Compazine  [Prochlorperazine Edisylate] Other (See Comments)    Neurological reaction   Emgality  [Galcanezumab -Gnlm] Hives and Swelling   Etanercept Swelling and Rash   Infliximab Rash   Iohexol      Iv contrast dye -rash all over   Orencia [Abatacept] Rash   Prochlorperazine Edisylate     unknown   Tofacitinib Rash and Other (See Comments)    Severe abdominal pain    Trokendi  Xr [Topiramate  Er]     Brain fog, memory issues, word finding issues   Shrimp Extract Other (See Comments)   Topiramate  Other (See Comments)   Tramadol      Nausea    Amiodarone Nausea Only   Rituximab Rash    Causes a rash

## 2025-01-27 ENCOUNTER — Other Ambulatory Visit: Payer: Self-pay

## 2025-01-28 ENCOUNTER — Ambulatory Visit: Admitting: Podiatry

## 2025-01-28 ENCOUNTER — Ambulatory Visit (INDEPENDENT_AMBULATORY_CARE_PROVIDER_SITE_OTHER)

## 2025-01-28 ENCOUNTER — Encounter: Payer: Self-pay | Admitting: Podiatry

## 2025-01-28 VITALS — Ht 62.5 in | Wt 180.0 lb

## 2025-01-28 DIAGNOSIS — M79674 Pain in right toe(s): Secondary | ICD-10-CM

## 2025-01-28 DIAGNOSIS — S99921A Unspecified injury of right foot, initial encounter: Secondary | ICD-10-CM

## 2025-01-28 NOTE — Patient Instructions (Signed)
 Call [facility] to schedule your MRI at the below locations.  Please allow at least 1 business day after your visit to process the referral.  It may take longer depending on approval from insurance.  Please let me know if you have issues or problems scheduling the MRI   [facility] [place] [phone] [address] [place] [address] [place], [place] [address]  [facility] [phone] [address] [place], [place] [address]

## 2025-01-28 NOTE — Progress Notes (Signed)
"   °  Subjective:  Patient ID: [name], female    DOB: 04/25/1966,  MRN: [id number]  Chief Complaint  Patient presents with   Toe Pain    RM 5 Urgent work in-2nd R toe is more numb in sensation and is now leaning more towards the left. Pt states right 2nd toe has a small knot on dorsal aspect and toe looks  blanched.    Discussed the use of AI scribe software for clinical note transcription with the patient, who gave verbal consent to proceed.  History of Present Illness [name] is a 59 year old female with type 1 diabetes and rheumatoid arthritis who returns for follow-up with swelling tenderness and deviation of the right second toe.  Does not recall a specific traumatic injury that precipitated it.  Started in the last week.    Objective:    Physical Exam VASCULAR: DP and PT pulse palpable. Foot is warm and well-perfused. Capillary fill time is brisk. DERMATOLOGIC: Normal skin turgor, texture, and temperature. No open lesions, rashes, or ulcerations. NEUROLOGIC: Normal sensation to light touch and pressure. No paresthesias on examination.  ORTHOPEDIC: Right second toe has edema and tenderness around the MTP joints specially with plantar plate and lateral collateral ligament with adduction and attempted dorsiflexion.  Deviation to the medial side       Results LABS A1c: 6.1%  RADIOLOGY Foot X-ray: Arterial calcifications of the dorsal and plantar vasculature. No evidence of acute stress fracture or fracture. No bony callus noted. Pes cavus foot type with metatarsus adductus, some dorsal spurring in the midfoot. (07/07/2024)  MRI right foot 08/13/2024 IMPRESSION: 1. Periarticular marrow edema on either side of the second MTP joint. Small second MTP joint effusion and synovitis. No erosive changes. Findings are nonspecific but can be seen in the setting of an inflammatory arthropathy versus stress reaction versus less likely septic arthritis. 2. Mild osteoarthritis  of the navicular-medial cuneiform joint with subchondral marrow edema. 3. Mild osteoarthritis of the second TMT joint.     Electronically Signed   By: Julaine Blanch M.D.   On: 08/24/2024 12:52   Assessment:   1. Plantar plate injury, right, initial encounter      Plan:  Patient was evaluated and treated and all questions answered.  Assessment and Plan Assessment & Plan Foot Pain and Swelling Acutely worsened second toe I discussed with her the likely is a plantar plate rupture and/or collateral ligament rupture.  Neuroma possibility as well although this was not palpable.  I recommended MRI to evaluate as this may benefit from possible surgical intervention.  Follow-up with me after the MRI in a few weeks.  Stat MRI referral sent.  Recommended splinting with a Budin splint.  This was applied today.  Weightbearing as tolerated shoes if comfortable.  Has CAM boot and shoe if needed.      Return in about 5 weeks (around 03/04/2025) for Follow-up on toe injury right second after MRI to review.   "

## 2025-01-29 ENCOUNTER — Other Ambulatory Visit: Payer: Self-pay

## 2025-01-29 ENCOUNTER — Other Ambulatory Visit (HOSPITAL_COMMUNITY): Payer: Self-pay

## 2025-01-29 NOTE — Progress Notes (Signed)
 Specialty Pharmacy Refill Coordination Note  [name] is a 59 y.o. female contacted today regarding refills of specialty medication(s) Upadacitinib  (Rinvoq )   Patient requested Delivery   Delivery date: 02/04/25   Verified address: [address] [name] [address]   Medication will be filled on: 02/03/25

## 2025-02-02 ENCOUNTER — Ambulatory Visit: Admitting: Sleep Medicine

## 2025-02-02 ENCOUNTER — Encounter: Payer: Self-pay | Admitting: Gastroenterology

## 2025-02-02 ENCOUNTER — Other Ambulatory Visit (HOSPITAL_COMMUNITY): Payer: Self-pay

## 2025-02-03 ENCOUNTER — Other Ambulatory Visit: Payer: Self-pay

## 2025-02-03 ENCOUNTER — Other Ambulatory Visit (HOSPITAL_COMMUNITY): Payer: Self-pay

## 2025-02-03 ENCOUNTER — Encounter: Payer: Self-pay | Admitting: Internal Medicine

## 2025-02-03 ENCOUNTER — Other Ambulatory Visit: Payer: Self-pay | Admitting: Nurse Practitioner

## 2025-02-03 DIAGNOSIS — Z1231 Encounter for screening mammogram for malignant neoplasm of breast: Secondary | ICD-10-CM

## 2025-02-04 ENCOUNTER — Ambulatory Visit
Admission: RE | Admit: 2025-02-04 | Discharge: 2025-02-04 | Disposition: A | Source: Ambulatory Visit | Attending: Podiatry

## 2025-02-04 ENCOUNTER — Other Ambulatory Visit (HOSPITAL_COMMUNITY): Payer: Self-pay

## 2025-02-04 ENCOUNTER — Other Ambulatory Visit (HOSPITAL_BASED_OUTPATIENT_CLINIC_OR_DEPARTMENT_OTHER): Payer: Self-pay

## 2025-02-04 DIAGNOSIS — S99921A Unspecified injury of right foot, initial encounter: Secondary | ICD-10-CM

## 2025-02-04 MED ORDER — METFORMIN HCL 500 MG PO TABS
1000.0000 mg | ORAL_TABLET | Freq: Two times a day (BID) | ORAL | 5 refills | Status: AC
  Filled 2025-02-04: qty 180, 45d supply, fill #0

## 2025-02-05 ENCOUNTER — Encounter: Payer: Self-pay | Admitting: Podiatry

## 2025-02-05 ENCOUNTER — Telehealth: Payer: Self-pay | Admitting: Internal Medicine

## 2025-02-05 ENCOUNTER — Ambulatory Visit: Payer: Self-pay | Admitting: Podiatry

## 2025-02-05 NOTE — Telephone Encounter (Signed)
 Patient came in to office today and brought an Omnipod Insulin  Therapy Prescription Form to be completed.  Patient would like to be called to pick the form up once it is completed.  The for is in Dr. Ara folder in the front office.

## 2025-02-10 ENCOUNTER — Ambulatory Visit: Admitting: Sleep Medicine

## 2025-02-15 ENCOUNTER — Encounter: Admitting: Physical Medicine and Rehabilitation

## 2025-03-01 ENCOUNTER — Ambulatory Visit (INDEPENDENT_AMBULATORY_CARE_PROVIDER_SITE_OTHER): Admitting: Family Medicine

## 2025-03-04 ENCOUNTER — Ambulatory Visit (INDEPENDENT_AMBULATORY_CARE_PROVIDER_SITE_OTHER): Admitting: Audiology

## 2025-03-09 ENCOUNTER — Encounter

## 2025-03-09 ENCOUNTER — Ambulatory Visit: Admitting: Podiatry

## 2025-03-16 ENCOUNTER — Ambulatory Visit: Admitting: Physician Assistant

## 2025-06-10 ENCOUNTER — Ambulatory Visit: Admitting: Internal Medicine

## 2025-11-24 ENCOUNTER — Ambulatory Visit

## 2025-12-15 ENCOUNTER — Encounter: Admitting: Nurse Practitioner

## 2026-01-21 ENCOUNTER — Encounter (INDEPENDENT_AMBULATORY_CARE_PROVIDER_SITE_OTHER): Admitting: Ophthalmology
